# Patient Record
Sex: Male | Born: 1955 | Race: White | Hispanic: No | Smoking: Former smoker
Health system: Southern US, Community
[De-identification: ages and names within clinical notes are randomized; demographics above are authoritative.]

## PROBLEM LIST (undated history)

## (undated) ENCOUNTER — Ambulatory Visit (HOSPITAL_COMMUNITY): Discharge status: Absent without leave | Payer: Medicare PPO

## (undated) DIAGNOSIS — M1990 Unspecified osteoarthritis, unspecified site: Secondary | ICD-10-CM

## (undated) DIAGNOSIS — Z803 Family history of malignant neoplasm of breast: Secondary | ICD-10-CM

## (undated) DIAGNOSIS — I10 Essential (primary) hypertension: Secondary | ICD-10-CM

## (undated) DIAGNOSIS — E785 Hyperlipidemia, unspecified: Secondary | ICD-10-CM

## (undated) DIAGNOSIS — F329 Major depressive disorder, single episode, unspecified: Secondary | ICD-10-CM

## (undated) DIAGNOSIS — Z8049 Family history of malignant neoplasm of other genital organs: Secondary | ICD-10-CM

## (undated) DIAGNOSIS — Z800 Family history of malignant neoplasm of digestive organs: Secondary | ICD-10-CM

## (undated) DIAGNOSIS — G4730 Sleep apnea, unspecified: Secondary | ICD-10-CM

## (undated) DIAGNOSIS — Z923 Personal history of irradiation: Secondary | ICD-10-CM

## (undated) DIAGNOSIS — F32A Depression, unspecified: Secondary | ICD-10-CM

## (undated) DIAGNOSIS — Z86010 Personal history of colonic polyps: Secondary | ICD-10-CM

## (undated) DIAGNOSIS — I482 Chronic atrial fibrillation, unspecified: Secondary | ICD-10-CM

## (undated) DIAGNOSIS — F419 Anxiety disorder, unspecified: Secondary | ICD-10-CM

## (undated) DIAGNOSIS — Z8481 Family history of carrier of genetic disease: Secondary | ICD-10-CM

## (undated) HISTORY — DX: Anxiety disorder, unspecified: F41.9

## (undated) HISTORY — DX: Family history of carrier of genetic disease: Z84.81

## (undated) HISTORY — DX: Family history of malignant neoplasm of digestive organs: Z80.0

## (undated) HISTORY — DX: Family history of malignant neoplasm of breast: Z80.3

## (undated) HISTORY — DX: Family history of malignant neoplasm of other genital organs: Z80.49

## (undated) HISTORY — DX: Sleep apnea, unspecified: G47.30

## (undated) HISTORY — DX: Depression, unspecified: F32.A

## (undated) HISTORY — DX: Hyperlipidemia, unspecified: E78.5

## (undated) HISTORY — DX: Chronic atrial fibrillation, unspecified: I48.20

## (undated) HISTORY — DX: Personal history of colonic polyps: Z86.010

## (undated) HISTORY — DX: Essential (primary) hypertension: I10

## (undated) HISTORY — PX: HIP FRACTURE SURGERY: SHX118

## (undated) HISTORY — DX: Major depressive disorder, single episode, unspecified: F32.9

## (undated) HISTORY — DX: Unspecified osteoarthritis, unspecified site: M19.90

---

## 2000-06-24 ENCOUNTER — Inpatient Hospital Stay (HOSPITAL_COMMUNITY): Admission: EM | Admit: 2000-06-24 | Discharge: 2000-06-25 | Payer: Self-pay | Admitting: *Deleted

## 2000-06-24 ENCOUNTER — Encounter: Payer: Self-pay | Admitting: *Deleted

## 2000-07-08 ENCOUNTER — Encounter: Admission: RE | Admit: 2000-07-08 | Discharge: 2000-07-08 | Payer: Self-pay | Admitting: Family Medicine

## 2000-07-08 HISTORY — PX: OTHER SURGICAL HISTORY: SHX169

## 2000-08-05 ENCOUNTER — Encounter: Admission: RE | Admit: 2000-08-05 | Discharge: 2000-08-05 | Payer: Self-pay | Admitting: Family Medicine

## 2001-03-10 ENCOUNTER — Encounter: Admission: RE | Admit: 2001-03-10 | Discharge: 2001-03-10 | Payer: Self-pay | Admitting: Family Medicine

## 2002-03-23 ENCOUNTER — Encounter: Admission: RE | Admit: 2002-03-23 | Discharge: 2002-03-23 | Payer: Self-pay | Admitting: Family Medicine

## 2002-11-09 HISTORY — PX: ATRIAL ABLATION SURGERY: SHX560

## 2004-09-25 ENCOUNTER — Ambulatory Visit: Payer: Self-pay | Admitting: Family Medicine

## 2005-02-12 ENCOUNTER — Ambulatory Visit (HOSPITAL_COMMUNITY): Admission: RE | Admit: 2005-02-12 | Discharge: 2005-02-12 | Payer: Self-pay | Admitting: Family Medicine

## 2005-02-12 ENCOUNTER — Ambulatory Visit: Payer: Self-pay | Admitting: Family Medicine

## 2005-02-27 ENCOUNTER — Ambulatory Visit (HOSPITAL_COMMUNITY): Admission: RE | Admit: 2005-02-27 | Discharge: 2005-02-27 | Payer: Self-pay | Admitting: *Deleted

## 2005-03-05 ENCOUNTER — Ambulatory Visit (HOSPITAL_COMMUNITY): Admission: RE | Admit: 2005-03-05 | Discharge: 2005-03-06 | Payer: Self-pay | Admitting: *Deleted

## 2006-01-09 ENCOUNTER — Emergency Department (HOSPITAL_COMMUNITY): Admission: EM | Admit: 2006-01-09 | Discharge: 2006-01-09 | Payer: Self-pay | Admitting: Family Medicine

## 2006-06-17 ENCOUNTER — Ambulatory Visit: Payer: Self-pay | Admitting: Family Medicine

## 2006-10-15 ENCOUNTER — Ambulatory Visit: Payer: Self-pay | Admitting: Gastroenterology

## 2006-10-28 ENCOUNTER — Ambulatory Visit: Payer: Self-pay | Admitting: Family Medicine

## 2006-11-12 ENCOUNTER — Ambulatory Visit: Payer: Self-pay | Admitting: Gastroenterology

## 2006-11-12 ENCOUNTER — Encounter (INDEPENDENT_AMBULATORY_CARE_PROVIDER_SITE_OTHER): Payer: Self-pay | Admitting: Specialist

## 2006-11-12 DIAGNOSIS — Z860100 Personal history of colon polyps, unspecified: Secondary | ICD-10-CM

## 2006-11-12 HISTORY — DX: Personal history of colon polyps, unspecified: Z86.0100

## 2006-11-12 HISTORY — DX: Personal history of colonic polyps: Z86.010

## 2006-11-24 ENCOUNTER — Ambulatory Visit (HOSPITAL_COMMUNITY): Admission: RE | Admit: 2006-11-24 | Discharge: 2006-11-24 | Payer: Self-pay | Admitting: Cardiology

## 2006-11-24 ENCOUNTER — Encounter (INDEPENDENT_AMBULATORY_CARE_PROVIDER_SITE_OTHER): Payer: Self-pay | Admitting: Cardiology

## 2006-12-06 ENCOUNTER — Emergency Department (HOSPITAL_COMMUNITY): Admission: EM | Admit: 2006-12-06 | Discharge: 2006-12-06 | Payer: Self-pay

## 2007-01-06 DIAGNOSIS — R000 Tachycardia, unspecified: Secondary | ICD-10-CM | POA: Insufficient documentation

## 2007-01-06 DIAGNOSIS — I4891 Unspecified atrial fibrillation: Secondary | ICD-10-CM | POA: Insufficient documentation

## 2007-02-17 ENCOUNTER — Ambulatory Visit: Payer: Self-pay | Admitting: Family Medicine

## 2007-02-17 DIAGNOSIS — M25569 Pain in unspecified knee: Secondary | ICD-10-CM | POA: Insufficient documentation

## 2007-09-15 ENCOUNTER — Ambulatory Visit: Payer: Self-pay | Admitting: Family Medicine

## 2007-11-09 ENCOUNTER — Emergency Department (HOSPITAL_COMMUNITY): Admission: EM | Admit: 2007-11-09 | Discharge: 2007-11-09 | Payer: Self-pay | Admitting: Family Medicine

## 2007-11-15 ENCOUNTER — Ambulatory Visit: Payer: Self-pay | Admitting: Family Medicine

## 2007-11-15 ENCOUNTER — Encounter: Payer: Self-pay | Admitting: Family Medicine

## 2007-11-15 LAB — CONVERTED CEMR LAB: PSA: 0.5 ng/mL (ref 0.10–4.00)

## 2007-11-17 ENCOUNTER — Encounter: Payer: Self-pay | Admitting: Family Medicine

## 2008-03-09 ENCOUNTER — Telehealth (INDEPENDENT_AMBULATORY_CARE_PROVIDER_SITE_OTHER): Payer: Self-pay | Admitting: Family Medicine

## 2008-03-14 ENCOUNTER — Encounter: Payer: Self-pay | Admitting: Family Medicine

## 2008-03-27 ENCOUNTER — Encounter: Payer: Self-pay | Admitting: Family Medicine

## 2008-03-31 ENCOUNTER — Emergency Department (HOSPITAL_COMMUNITY): Admission: EM | Admit: 2008-03-31 | Discharge: 2008-03-31 | Payer: Self-pay | Admitting: Family Medicine

## 2008-09-19 ENCOUNTER — Encounter: Payer: Self-pay | Admitting: Family Medicine

## 2008-09-20 ENCOUNTER — Ambulatory Visit: Payer: Self-pay | Admitting: Family Medicine

## 2008-09-27 ENCOUNTER — Encounter: Admission: RE | Admit: 2008-09-27 | Discharge: 2008-09-27 | Payer: Self-pay | Admitting: Family Medicine

## 2009-01-03 ENCOUNTER — Ambulatory Visit: Payer: Self-pay | Admitting: Family Medicine

## 2009-01-15 ENCOUNTER — Encounter: Admission: RE | Admit: 2009-01-15 | Discharge: 2009-02-06 | Payer: Self-pay | Admitting: Family Medicine

## 2009-01-15 ENCOUNTER — Encounter: Payer: Self-pay | Admitting: Family Medicine

## 2009-02-06 ENCOUNTER — Encounter: Payer: Self-pay | Admitting: Family Medicine

## 2009-02-07 ENCOUNTER — Telehealth: Payer: Self-pay | Admitting: *Deleted

## 2009-02-21 ENCOUNTER — Ambulatory Visit: Payer: Self-pay | Admitting: Family Medicine

## 2009-02-21 DIAGNOSIS — B351 Tinea unguium: Secondary | ICD-10-CM | POA: Insufficient documentation

## 2009-02-22 LAB — CONVERTED CEMR LAB
ALT: 25 units/L (ref 0–53)
AST: 27 units/L (ref 0–37)
Albumin: 4.3 g/dL (ref 3.5–5.2)
Alkaline Phosphatase: 48 units/L (ref 39–117)
Bilirubin, Direct: 0.2 mg/dL (ref 0.0–0.3)
Cholesterol: 165 mg/dL (ref 0–200)
HDL: 40 mg/dL (ref >39)
Indirect Bilirubin: 1.1 mg/dL — ABNORMAL HIGH (ref 0.0–0.9)
LDL Cholesterol: 105 mg/dL — ABNORMAL HIGH (ref 0–99)
Total Bilirubin: 1.3 mg/dL — ABNORMAL HIGH (ref 0.3–1.2)
Total CHOL/HDL Ratio: 4.1
Total Protein: 7.1 g/dL (ref 6.0–8.3)
Triglycerides: 100 mg/dL (ref <150)
VLDL: 20 mg/dL (ref 0–40)

## 2009-04-15 ENCOUNTER — Encounter: Payer: Self-pay | Admitting: Family Medicine

## 2009-04-15 ENCOUNTER — Ambulatory Visit: Payer: Self-pay | Admitting: Family Medicine

## 2009-04-15 LAB — CONVERTED CEMR LAB
ALT: 24 units/L (ref 0–53)
AST: 29 units/L (ref 0–37)
Albumin: 4.8 g/dL (ref 3.5–5.2)
Alkaline Phosphatase: 44 units/L (ref 39–117)
Bilirubin, Direct: 0.2 mg/dL (ref 0.0–0.3)
Indirect Bilirubin: 1.2 mg/dL — ABNORMAL HIGH (ref 0.0–0.9)
Total Bilirubin: 1.4 mg/dL — ABNORMAL HIGH (ref 0.3–1.2)
Total Protein: 7.4 g/dL (ref 6.0–8.3)

## 2009-04-16 ENCOUNTER — Encounter: Payer: Self-pay | Admitting: Family Medicine

## 2009-05-23 ENCOUNTER — Ambulatory Visit: Payer: Self-pay | Admitting: Family Medicine

## 2009-05-23 DIAGNOSIS — R51 Headache: Secondary | ICD-10-CM | POA: Insufficient documentation

## 2009-05-23 DIAGNOSIS — R519 Headache, unspecified: Secondary | ICD-10-CM | POA: Insufficient documentation

## 2009-12-12 ENCOUNTER — Ambulatory Visit: Payer: Self-pay | Admitting: Family Medicine

## 2009-12-12 LAB — CONVERTED CEMR LAB: PSA: 0.53 ng/mL (ref 0.10–4.00)

## 2010-03-25 ENCOUNTER — Emergency Department (HOSPITAL_COMMUNITY): Admission: EM | Admit: 2010-03-25 | Discharge: 2010-03-25 | Payer: Self-pay | Admitting: Family Medicine

## 2010-06-11 ENCOUNTER — Emergency Department (HOSPITAL_COMMUNITY): Admission: EM | Admit: 2010-06-11 | Discharge: 2010-06-11 | Payer: Self-pay | Admitting: Family Medicine

## 2010-06-15 ENCOUNTER — Emergency Department (HOSPITAL_COMMUNITY): Admission: EM | Admit: 2010-06-15 | Discharge: 2010-06-15 | Payer: Self-pay | Admitting: Family Medicine

## 2010-06-25 ENCOUNTER — Ambulatory Visit: Payer: Self-pay | Admitting: Family Medicine

## 2010-06-25 DIAGNOSIS — R05 Cough: Secondary | ICD-10-CM

## 2010-06-25 DIAGNOSIS — R059 Cough, unspecified: Secondary | ICD-10-CM | POA: Insufficient documentation

## 2010-06-25 DIAGNOSIS — J209 Acute bronchitis, unspecified: Secondary | ICD-10-CM | POA: Insufficient documentation

## 2010-07-04 ENCOUNTER — Encounter: Payer: Self-pay | Admitting: Family Medicine

## 2010-07-08 ENCOUNTER — Encounter: Payer: Self-pay | Admitting: Family Medicine

## 2010-07-22 HISTORY — PX: OTHER SURGICAL HISTORY: SHX169

## 2010-07-29 ENCOUNTER — Encounter: Payer: Self-pay | Admitting: Family Medicine

## 2010-10-21 ENCOUNTER — Encounter: Payer: Self-pay | Admitting: Family Medicine

## 2010-12-11 ENCOUNTER — Encounter: Payer: Self-pay | Admitting: *Deleted

## 2010-12-11 NOTE — Assessment & Plan Note (Signed)
Summary: CPP/EO   Vital Signs:  Patient profile:   55 year old male Height:      69 inches Weight:      165 pounds BMI:     24.45 BSA:     1.91 Temp:     97.9 degrees F Pulse rate:   51 / minute BP sitting:   122 / 82  Vitals Entered By: Jone Baseman CMA (December 12, 2009 9:05 AM) CC: CPE Is Patient Diabetic? No Pain Assessment Patient in pain? no        CC:  CPE.  History of Present Illness: Mild aching in hand joints - no soft tissue swelling or redness or other joints Hemorrhiods - itching and small amount of discharge occaisional but no bleeding  ROS - as above PMH - Medications reviewed and updated in medication list.  Smoking Status noted in VS form    Habits & Providers  Alcohol-Tobacco-Diet     Tobacco Status: never  -  Date:  11/09/2005    Colonoscopy had polyp at [facility] [place] [facility]  Current Medications (verified): 1)  Ibuprofen 800 Mg Tabs (Ibuprofen) .... Take 1 Tablet By Mouth Three Times A Day As Needed  Allergies: No Known Drug Allergies  Family History: Reviewed history from 02/17/2007 and no changes required. coronary art disase father,  Diabetes 1st degree,  No colon CA  Social History: Reviewed history from 01/06/2007 and no changes required. Single, works at [employer],  Exercises regularly  Review of Systems  The patient denies anorexia, fever, weight loss, weight gain, vision loss, decreased hearing, hoarseness, chest pain, syncope, dyspnea on exertion, peripheral edema, prolonged cough, headaches, hemoptysis, abdominal pain, melena, hematochezia, severe indigestion/heartburn, hematuria, incontinence, genital sores, muscle weakness, suspicious skin lesions, transient blindness, difficulty walking, depression, unusual weight change, abnormal bleeding, enlarged lymph nodes, angioedema, and testicular masses.    Physical Exam  General:  Well-developed,well-nourished,in no acute distress; alert,appropriate and cooperative throughout  examination Head:  Normocephalic and atraumatic without obvious abnormalities. No apparent alopecia or balding. Eyes:  No corneal or conjunctival inflammation noted. EOMI. Perrla. Frmal. Ears:  External ear exam shows no significant lesions or deformities.  Otoscopic examination reveals clear canals, tympanic membranes are intact bilaterally without bulging, retraction, inflammation or discharge. Hearing is grossly normal bilaterally. Mouth:  Oral mucosa and oropharynx without lesions or exudates.  Teeth in good repair. Neck:  No deformities, masses, or tenderness noted. Lungs:  Normal respiratory effort, chest expands symmetrically. Lungs are clear to auscultation, no crackles or wheezes. Heart:  Normal rate and regular rhythm. S1 and S2 normal without gallop, murmur, click, rub or other extra sounds. Abdomen:  Bowel sounds positive,abdomen soft and non-tender without masses, organomegaly or hernias noted. Rectal:  Normal sphincter tone. No rectal masses or tenderness. external hemorrhoidal tags  Genitalia:  Testes bilaterally descended without nodularity, tenderness or masses. No scrotal masses or lesions. No penis lesions or urethral discharge. Prostate:  Prostate gland firm and smooth, no enlargement, nodularity, tenderness, mass, asymmetry or induration. Msk:  No deformity or scoliosis noted of thoracic or lumbar spine.   Extremities:  No clubbing, cyanosis, edema, or deformity noted with normal full range of motion of all joints.   Skin:  Intact without suspicious lesions or rashes Cervical Nodes:  No lymphadenopathy noted Inguinal Nodes:  No significant adenopathy Psych:  Cognition and judgment appear intact. Alert and cooperative with normal attention span and concentration. No apparent delusions, illusions, hallucinations   Impression & Recommendations:  Problem # 1:  Preventive Health Care (ICD-V70.0)  normal exam.  Mild  degenerative joint disease of hands and uncomplicated  hemorrhoids.  up to date on screening except PSA   Orders: [facility] - Est  40-64 yrs (57846)  Problem # 2:  ONYCHOMYCOSIS (ICD-110.1) finished course - did not improve much  The following medications were removed from the medication list:    Lamisil 250 Mg Tabs (Terbinafine hcl) .Marland Kitchen... 1 by mouth once daily for onchyomycosis  Problem # 3:  TACHYCARDIA (ICD-785.0) no symptoms   Complete Medication List: 1)  Ibuprofen 800 Mg Tabs (Ibuprofen) .... Take 1 tablet by mouth three times a day as needed  Other Orders: PSA-[facility] ([id number])  Patient Instructions: 1)  Please schedule a follow-up appointment in 1 year.  2)  Your weight and exercise regimen are great - keep them going 3)  See an optometrist to be checked for glaucoma 4)  We will check your PSA and call you if any problems 5)  Use ibuprofen or tylenol for hand pain   Prevention & Chronic Care Immunizations   Influenza vaccine: Not documented    Tetanus booster: 05/09/2006: Done.   Tetanus booster due: 05/09/2016    Pneumococcal vaccine: Not documented  Colorectal Screening   Hemoccult: .  (09/20/2008)   Hemoccult due: Not Indicated    Colonoscopy: had polyp at [facility]  (11/09/2005)   Colonoscopy due: 11/09/2010  Other Screening   PSA: 0.50  (11/15/2007)   PSA ordered.   PSA due due: 11/14/2008   Smoking status: never  (12/12/2009)  Lipids   Total Cholesterol: 165  (02/21/2009)   LDL: 105  (02/21/2009)   LDL Direct: Not documented   HDL: 40  (02/21/2009)   Triglycerides: 100  (02/21/2009)

## 2010-12-11 NOTE — Consult Note (Signed)
Summary: [facility]   Imported By: De Nurse 10/30/2010 17:01:22  _____________________________________________________________________  External Attachment:    Type:   Image     Comment:   External Document

## 2010-12-11 NOTE — Consult Note (Signed)
Summary: [facility]   Imported By: Bradly Bienenstock 07/15/2010 13:55:17  _____________________________________________________________________  External Attachment:    Type:   Image     Comment:   External Document

## 2010-12-11 NOTE — Consult Note (Signed)
Summary: [facility]   Imported By: De Nurse 08/27/2010 14:52:37  _____________________________________________________________________  External Attachment:    Type:   Image     Comment:   External Document

## 2010-12-11 NOTE — Consult Note (Signed)
Summary: [facility]  [facility]   Imported By: Clydell Hakim 07/29/2010 12:03:52  _____________________________________________________________________  External Attachment:    Type:   Image     Comment:   External Document

## 2010-12-11 NOTE — Assessment & Plan Note (Signed)
Summary: persistant cough,df   Vital Signs:  Patient profile:   55 year old male Height:      69 inches (175.26 cm) Weight:      159.2 pounds (72.36 kg) BMI:     23.59 Temp:     97.7 degrees F (36.50 degrees C) oral Pulse rate:   85 / minute BP sitting:   121 / 83  (left arm)  Vitals Entered By: Starleen Arms CMA (June 25, 2010 9:36 AM) CC: persistant cough Is Patient Diabetic? No Pain Assessment Patient in pain? no        Primary Care Provider:  Pearlean Brownie MD  CC:  persistant cough.  History of Present Illness: 55 yo M:  1. Cough: x 15 days, started to get better last week but worsened again, endorses cough - productive of green-brown sputum, subjective fever/chills, sore throat early but now resolved, weakness, nasal congestion/runny nose, intermittent gneralized HA. denies ear pain, SOB, CP, N/V/D, LE edema, rash, sinus pain. + sick contacts. no Hx of smoking, asthma, allergies. has been seen at [facility] twice during this illness - treated with Mucinex, Tussinex, Ibuprofen, and a steroid burst. Tussinex has helped with the cough - only taking at night and has 1/2 bottle left.   Habits & Providers  Alcohol-Tobacco-Diet     Tobacco Status: never  Current Medications (verified): 1)  Ibuprofen 800 Mg Tabs (Ibuprofen) .... Take 1 Tablet By Mouth Three Times A Day As Needed 2)  Amoxicillin 500 Mg Tabs (Amoxicillin) .... One By Mouth Three Times A Day X 10 Days  Allergies (verified): No Known Drug Allergies PMH-FH-SH reviewed for relevance  Review of Systems      See HPI  Physical Exam  General:  Well-developed, well-nourished, in no acute distress; alert, appropriate and cooperative throughout examination. Vitals reviewed. Lethargic. Head:  Normocephalic and atraumatic without obvious abnormalities.  Eyes:  No corneal or conjunctival inflammation noted. EOMI. Perrla.  Ears:  R ear normal and L ear normal.   Nose:  Nasal discharge, mucosal pallor.   Mouth:   Oral mucosa and oropharynx without lesions or exudates.   Neck:  No deformities, masses, or tenderness noted. Lungs:  Normal respiratory effort, chest expands symmetrically. Rhonci throughout, cleared with cough. Heart:  Normal rate and regular rhythm. S1 and S2 normal without gallop, murmur, click, rub or other extra sounds. Pulses:  R and L dorsalis pedis and posterior tibial pulses are full and equal bilaterally. Extremities:  No edema. Skin:  Intact without suspicious lesions or rashes. Psych:  Oriented X3, memory intact for recent and remote, normally interactive, good eye contact, not anxious appearing, and not depressed appearing.     Impression & Recommendations:  Problem # 1:  COUGH (ICD-786.2) Assessment New  Orders: [facility]- Est Level  3 (47425)  Problem # 2:  ACUTE BRONCHITIS (ICD-466.0)  Discussed likelihood that this is viral. However, due to story of improvement and then deterioration, as well as length of cough, will Rx Abx. Continue Tussinex for cough. Warned him that cough may last for 3-4 weeks. Red Flags given.  His updated medication list for this problem includes:    Azithromycin 250 Mg Tabs (Azithromycin) .Marland Kitchen... 2 by  mouth today and then 1 daily for 4 days  Orders: [facility]- Est Level  3 (95638)  Patient Instructions: 1)  It was nice to meet you today. 2)  I am prescribing an antibiotic for your respiratory infection. 3)  Follow up if you are not improving in  2-3 weeks. Prescriptions: AZITHROMYCIN 250 MG  TABS (AZITHROMYCIN) 2 by  mouth today and then 1 daily for 4 days  #6 x 0   Entered and Authorized by:   Helane Rima DO   Signed by:   Helane Rima DO on 06/25/2010   Method used:   Electronically to        [facility]* (retail)       [address]       [address]/mailing       [place], [place]  [address]       Ph: [phone]       Fax: [phone]   RxID:   [id number] AMOXICILLIN 500 MG TABS (AMOXICILLIN) one by  mouth three times a day x 10 days  #30 x 0   Entered and Authorized by:   Helane Rima DO   Signed by:   Helane Rima DO on 06/25/2010   Method used:   Electronically to        [facility]* (retail)       [address]       [address]mail[address]       [place], [place]  [address]       Ph: [phone]       Fax: [phone]   RxID:   [id number]

## 2010-12-17 ENCOUNTER — Ambulatory Visit (INDEPENDENT_AMBULATORY_CARE_PROVIDER_SITE_OTHER): Payer: Commercial Managed Care - PPO | Admitting: Family Medicine

## 2010-12-17 ENCOUNTER — Encounter: Payer: Self-pay | Admitting: Family Medicine

## 2010-12-17 VITALS — BP 140/88 | HR 63 | Temp 97.6°F | Ht 69.0 in | Wt 163.7 lb

## 2010-12-17 DIAGNOSIS — G4700 Insomnia, unspecified: Secondary | ICD-10-CM

## 2010-12-17 DIAGNOSIS — I4891 Unspecified atrial fibrillation: Secondary | ICD-10-CM

## 2010-12-17 NOTE — Progress Notes (Signed)
  Subjective:    Patient ID: Wayne Molina, male    DOB: 10-17-1956, 55 y.o.   MRN: [id number]  HPI  Generally feel well Here for his checkup  Insomnia Wakes up frequently.  Bed partner says he snores and often stops breathing.  Does have day time sleepiness.  No chest pain or shortness of breath during the day.   Has regular sleep habits   Review of Systems  Constitutional: Positive for fatigue. Negative for activity change and appetite change.  HENT: Negative for facial swelling and neck pain.   Eyes: Negative for discharge.  Respiratory: Negative for shortness of breath and wheezing.   Cardiovascular: Negative for chest pain and leg swelling.  Genitourinary: Negative for hematuria and difficulty urinating.  Musculoskeletal: Negative for back pain and gait problem.  Neurological: Negative for syncope and weakness.  Hematological: Negative for adenopathy.       Objective:   Physical Exam  Constitutional: He is oriented to person, place, and time. He appears well-developed.  HENT:  Head: Normocephalic and atraumatic.  Eyes: EOM are normal. Pupils are equal, round, and reactive to light.  Neck: Normal range of motion. Neck supple. No thyromegaly present.  Cardiovascular: Normal rate.   Murmur heard.      Irregular   Pulmonary/Chest: No respiratory distress. He has no wheezes. He has no rales.  Abdominal: He exhibits no distension and no mass. There is no tenderness.  Musculoskeletal: He exhibits no edema and no tenderness.  Lymphadenopathy:    He has no cervical adenopathy.  Neurological: He is alert and oriented to person, place, and time. He exhibits normal muscle tone. Coordination normal.  Skin: Skin is warm and dry. No rash noted. No erythema.  Psychiatric: He has a normal mood and affect. His behavior is normal.          Assessment & Plan:  Normal Exam.

## 2010-12-17 NOTE — Patient Instructions (Signed)
We will schedule a sleep study Keep up the great exercise Drop off a copy of your employee labs

## 2010-12-17 NOTE — Assessment & Plan Note (Signed)
Concerned for sleep apnea.  No other worrisome findings on exam.  Refer for sleep study

## 2011-01-05 ENCOUNTER — Telehealth: Payer: Self-pay | Admitting: Family Medicine

## 2011-01-05 NOTE — Telephone Encounter (Signed)
Pt calling about his referral for sleep study, we did not send pts tricare info to the sleep center, pt has umr & tricare.

## 2011-01-05 NOTE — Telephone Encounter (Signed)
Left message on voicemail that I spoke with [facility] and that I would fax over tricare card and they would be calling him back to set up appointment.

## 2011-01-29 ENCOUNTER — Ambulatory Visit (HOSPITAL_BASED_OUTPATIENT_CLINIC_OR_DEPARTMENT_OTHER): Payer: 59 | Attending: Family Medicine

## 2011-01-29 DIAGNOSIS — G4733 Obstructive sleep apnea (adult) (pediatric): Secondary | ICD-10-CM | POA: Insufficient documentation

## 2011-02-07 DIAGNOSIS — R0989 Other specified symptoms and signs involving the circulatory and respiratory systems: Secondary | ICD-10-CM

## 2011-02-07 DIAGNOSIS — G4733 Obstructive sleep apnea (adult) (pediatric): Secondary | ICD-10-CM

## 2011-02-07 DIAGNOSIS — R0609 Other forms of dyspnea: Secondary | ICD-10-CM

## 2011-02-07 DIAGNOSIS — G4737 Central sleep apnea in conditions classified elsewhere: Secondary | ICD-10-CM

## 2011-02-11 ENCOUNTER — Encounter: Payer: Self-pay | Admitting: Family Medicine

## 2011-02-11 ENCOUNTER — Ambulatory Visit (INDEPENDENT_AMBULATORY_CARE_PROVIDER_SITE_OTHER): Payer: Commercial Managed Care - PPO | Admitting: Family Medicine

## 2011-02-11 DIAGNOSIS — G4700 Insomnia, unspecified: Secondary | ICD-10-CM

## 2011-02-11 DIAGNOSIS — M79606 Pain in leg, unspecified: Secondary | ICD-10-CM

## 2011-02-11 DIAGNOSIS — M79609 Pain in unspecified limb: Secondary | ICD-10-CM

## 2011-02-11 NOTE — Assessment & Plan Note (Signed)
Most consistent with nerve irritation or tendon slippage but uncertain prognosis.  I can not reproduce on exam.  Doubt bony process but is at increased risk with prior hip surgery.  If persists or worsens will need xray.

## 2011-02-11 NOTE — Patient Instructions (Signed)
I think this is nerve or tendon slipping.  If worsens or becomes constant the call me and we will get an xray Check your blood pressure every so often.  If regularly > 140/90 then come in

## 2011-02-11 NOTE — Progress Notes (Signed)
  Subjective:    Patient ID: Wayne Molina, male    DOB: 27-Mar-1956, 55 y.o.   MRN: [id number]  HPI  Hip Pain Has an intermittent sharp shocking type pain in his R hip.   Lasts for split second and resolves.  Mainly when walking.  No radiation past mid thigh.  No back pain no incontinence no tender areas.   Has taken a few ibuprofen but do not seem to helps  PMH - had a fractured R hip in the 70s.   Had rods but have been removed   Review of Systems  See HPI     Objective:   Physical Exam   Extremities:  No cyanosis, edema, or deformity noted with good range of motion of all major joints.   R HIp - no redness or focal tenderness.  No pain with all ROM. No pain with SLR.  Able to walk on toes, heels and deep knee bend without pain     Assessment & Plan:

## 2011-02-12 NOTE — Assessment & Plan Note (Signed)
No results from Sleep study yet.  He relates he could not tolerate the mask.  Is sleeping better with flannel sheets

## 2011-02-24 ENCOUNTER — Telehealth: Payer: Self-pay | Admitting: Family Medicine

## 2011-02-25 NOTE — Telephone Encounter (Signed)
Spoke with him he will try CPAP and see if he can get used to it

## 2011-02-25 NOTE — Telephone Encounter (Signed)
Pt returning call, may be reached at above number anytime after 2 pm today.

## 2011-03-04 ENCOUNTER — Other Ambulatory Visit: Payer: Self-pay | Admitting: Family Medicine

## 2011-03-04 DIAGNOSIS — G4730 Sleep apnea, unspecified: Secondary | ICD-10-CM | POA: Insufficient documentation

## 2011-03-12 ENCOUNTER — Encounter: Payer: Self-pay | Admitting: Family Medicine

## 2011-03-27 NOTE — Assessment & Plan Note (Signed)
[facility]                         GASTROENTEROLOGY OFFICE NOTE   Wayne Molina, Wayne Molina                        MRN:          [id number]  DATE:10/15/2006                            DOB:          1956/07/14    Wayne Molina is a 55 year old white male, employee at [employer].  He  is referred through the courtesy of Dr. Deirdre Priest for colonoscopy  screening and evaluation of excessive abdominal gas.   Wayne Molina has been in good health all of his life except for some recurrent  atrial fibrillation requiring ablative therapy by Dr. Lenord Carbo in 2003.  He currently denies GI complaints.  He does have lactose intolerance,  and tries to avoid these substances.  He has a very dry mouth and uses  frequent candy and gum, which probably contains sorbitol and/or  fructose, which kind of prolong abdominal gas and bloating.  He  generally has regular bowel movements, denies melena or hematochezia,  and apparently had a negative Hemoccult card recently with his complete  physical exam.  He denies reflux symptoms, dysphagia, or any  hepatobiliary complaints.  He moves his bowels well without melena or  hematochezia.  He did have an empiric infection in 1978 when he was in  the [employer], but this resolved spontaneously.  He did travel  extensively to third world countries, but never had dysentery.  His  appetite is good, and his weight is stable.  He has never had barium or  endoscopic procedures.   PAST MEDICAL HISTORY:  Otherwise is remarkable for some degenerative  arthritis.  He denies any of history of medical problems.   MEDICATIONS:  Aspirin 325 mg a day.   ALLERGIES:  NONE.   FAMILY HISTORY:  Remarkable for a great grandmother who had colon cancer  at an elderly age.  His father has atherogenesis.   SOCIAL HISTORY:  He is divorced and lives with his daughter.  He has a  high school education.  He does not smoke or abuse ethanol.   REVIEW OF SYSTEMS:   Noncontributory.   EXAMINATION:  He is a healthy, fit-appearing white male, in no distress,  appearing his stated age.  He is 5 feet 9 inches tall and weighs 163 pounds.  Blood pressure is  110/72.  Pulse was 60 and regular.  I could not appreciate stigmata of chronic liver disease or thyromegaly.  CHEST:  His chest was clear to percussion and auscultation.  There were no murmurs, gallops or rubs noted.  He appeared bradycardic,  but in a normal rhythm.  There was no hepatosplenomegaly, abdominal masses or tenderness.  Bowel  sounds were normal.  Peripheral extremities were unremarkable.  Mental status was clear.   ASSESSMENT:  1. Need for screening colonoscopy because of his age.  2. Excessive gas, probably from carbohydrate malabsorption.  3. History of atrial fibrillation with previous ablation.  He      currently is somewhat bradycardic, probably due to his extreme      conditioning.   RECOMMENDATIONS:  1. Outpatient colonoscopy on ASA at his convenience.  2. Low gas  diet with avoidance of sorbitol and fructose.  3. Information concerning lactose intolerance.  4. Continue medical followup with Dr. Deirdre Priest as previously planned.     Vania Rea. Jarold Motto, MD, Caleen Essex, FAGA  Electronically Signed    DRP/MedQ  DD: 10/15/2006  DT: 10/15/2006  Job #: [id number]   cc:   Pearlean Brownie, M.D.

## 2011-03-27 NOTE — Cardiovascular Report (Signed)
NAME:  Wayne Molina, Wayne Molina NO.:  [id number]   MEDICAL RECORD NO.:  [id number]          PATIENT TYPE:  OIB   LOCATION:  3729                         FACILITY:  [facility]   PHYSICIAN:  Mark E. Severiano Gilbert, M.D.    DATE OF BIRTH:  May 11, 1956   DATE OF PROCEDURE:  03/05/2005  DATE OF DISCHARGE:                              CARDIAC CATHETERIZATION   PROCEDURE PERFORMED:  1.  Complete electrophysiology study with arrhythmia injection.  2.  Arrhythmia sub-straight mapping.  3.  Radiofrequency ablation of arrhythmia sub-straight.   OPERATOR:  Mark E. Severiano Gilbert, M.D.   COMPLICATIONS:  None.   ESTIMATED BLOOD LOSS:  Less than 30 mL.   PREPROCEDURE DIAGNOSIS:  Atrial flutter.   POSTPROCEDURE DIAGNOSIS:  Sinus brady.   MEDICATIONS GIVEN:  Versed, Fentanyl, Phenergan.   PROCEDURE IN DETAIL:  The patient was brought to the EP laboratory in a  fasting state.  The patient was prepped and draped.  Two 7 French safe  sheath systems were introduced into the right femoral vein using thin wall  needle technique after 1% local lidocaine for anesthesia.  On the left  femoral vein, a 7 [place] safe sheath and an 8 [place] atrial flutter ablation  sheath were placed using a thin wall needle technique after topical and  subcutaneous anesthesia with lidocaine.  Via the safe sheath system, a  decapolar catheter was positioned within the coronary sinus.  A dual  decapolar catheter was positioned along the lateral wall of the right  atrium, a hexapolar catheter was positioned in the His area, and an EPTA  symmetric radiofrequency ablation catheter was used to map and ablate.  The  patient was in atrial flutter at presentation.  After positioning the  catheters in appropriate positions, it was easy to document that he had  counter clockwise isthmus dependent flutter based on morphology.  Cycle  length of flutter was approximately 200 milliseconds.  Three ablation lines  were made from the tricuspid  annulus, initially through the mouth of the  coronary sinus to the IVC and then two other additional lines through the  tricuspid annulus directly into the IVC.  There was relative radiofrequency  ablation resistant rigid tissue in the area of the eustachian ridge that was  denoted after conversion of sinus rhythm by persistent non-split atrial  electrograms.  After multiple radiofrequency deliveries, there was  development of bidirectional block under pacing.  Prior to the development  of bidirectional block, burst atrial pacing had been able to induce atrial  flutter reproducible.  The rhythm post ablation was sinus rhythm, cycle  length was noted to be approximately 1000 with an AH interval of 110, HV  interval, and the QRS was narrow.  After completion of the procedure, all  sheaths and electrode catheters were removed without difficulty.  The  patient tolerated the procedure well and left the cath lab in stable,  hemodynamic condition.   IMPRESSION:  1.  Counter clockwise flutter.  2.  Status post radiofrequency ablation with development of bidirectional      isthmus block.   RECOMMENDATIONS:  See orders, I would recommend the patient be placed on  aspirin therapy full dose daily.      MEP/MEDQ  D:  03/05/2005  T:  03/05/2005  Job:  [id number]

## 2011-03-27 NOTE — Discharge Summary (Signed)
[place]  Patient:    BLANDON, OFFERDAHL                          MRN: [id number] Adm. Date:  [id number] Disc. Date: [id number] Attending:  Garnette Scheuermann Dictator:   Milana Huntsman, M.D.                           Discharge Summary  DATE OF BIRTH:  1956-08-14  DISCHARGE DIAGNOSIS:  Chest pain, likely viral pericarditis or gastroesophageal reflux disease.  CONSULTS WHILE IN HOSPITAL:  Cardiology.  PROCEDURES WHILE IN HOSPITAL: 1. Cardiac catheterization on June 25, 2000.  This was completely normal. 2. Echocardiogram on June 25, 2000.  This also was completely normal.  PAST MEDICAL HISTORY:  None.  PRESENTING ILLNESS:  For full history and physical please consult the dictation of June 24, 2000, but briefly this is a 55 year old male with no previous history of cardiac problems who presented to the emergency department with 4-5/10 in intensity chest pressure, substernally, with radiation to the manubrium.  The patient denied a history of chest pain on exertion, and denied shortness of breath, palpitations, or diaphoresis with episode.  However, he did state that he felt weak in his arms and legs.  On presentation to the emergency room the patient was found to have normal cardiac enzymes, but an EKG significant for some nonspecific ST depressions in leads II, III, and aVF. Chest x-ray at that time was negative.  Due to the patients questionable EKG changes, the patient was admitted with a diagnosis of atypical chest pain, rule out unstable angina.  HOSPITAL COURSE BY PROBLEM:  CARDIAC WORKUP:  The patient was started on unstable angina regimen of heparin, beta blocker, morphine sulfate, and aspirin.  Cardiac consultation was obtained on the evening of June 24, 2000, and they felt the patients symptoms and questionable EKG was worrisome to warrant cardiac catheterization which was performed on June 25, 2000, which showed no  vessel disease.  The patient also had two sets of negative cardiac enzymes.  The patient then underwent an echocardiogram which also was grossly normal.  Given the patients questionable EKG changes, cardiology felt that this could be an acute pericarditis, likely viral, or potentially GERD symptoms.  Therefore, the patient was given one dose of Decadron 4 mg x 1 in-house, and discharged with a prednisone taper, as well as a proton pump inhibitor for potential GI contribution to his pain.  DISCHARGE CONDITION:  The patient was discharged in good condition.  DISCHARGE MEDICATIONS: 1. The patient was given one prednisone taper pack per pharmacy. 2. Protonix 40 mg p.o. q.d. x 1 month.  PATIENT FOLLOWUP:  The patient was advised to make a followup appointment within the next week with his regular physician. DD:  06/27/00 TD:  06/28/00 Job: [id number]

## 2011-03-27 NOTE — Cardiovascular Report (Signed)
[place]  Patient:    Wayne Molina, Wayne Molina                          MRN: [id number] Adm. Date:  [id number] Attending:  Garnette Scheuermann CC:         [facility]  [facility], [address] [place] [place], [place] [address]                        Cardiac Catheterization  INDICATION:  Mr. Burks is a 55 year old divorced white male, admitted on June 24, 2000 with chest pain.  He has no prior cardiac history, nor does he have risk factors.  He was awakened with substernal chest pain radiating to his neck and back with upper extremity weakness; this occurred after eating. He came to [facility] ER where he was seen to have chest pain and was treated with IV heparin, nitroglycerin and aspirin.  His EKG showed biphasic anterior T waves which ultimately evolved into T wave inversion anterolaterally.  He ruled out for myocardial infarction.  He presents now for diagnostic coronary arteriography.  PROCEDURE DESCRIPTION:  Patient was brought to the second floor [place] [facility] in a post-absorptive state.  He was premedicated with p.o. Valium.  His right groin was shaved and prepped in the usual sterile fashion. Xylocaine 1% was used for local anesthesia.  A 6-French sheath was inserted into the right femoral artery using standard Seldinger technique.  The 6-French right and left Judkins diagnostic catheters along with a 6-French pigtail catheter were used for selective coronary angiography, left ventriculography, supravalvular aortography and abdominal aortography. Omnipaque dye was used for the entirety of the case.  Retrograde aortic, left ventricular and pullback pressures were recorded.  HEMODYNAMICS 1. Aortic systolic pressure 120; diastolic pressure 73. 2. Left ventricular systolic pressure 114; end-diastolic pressure 23. 3. Left anterior coronary angiography:    a. Left main normal.    b. LAD normal.    c. Left circumflex normal.    d. Right coronary artery was codominant and normal. 4. Left ventriculography:  RAO left ventriculogram was performed using 20 cc    of Omnipaque dye, 10 cc/sec.  The overall LVEF was estimated at greater    than 60% without focal wall motion abnormalities. 5. Supravalvular aortography:  Supravalvular aortogram was performed in the    LAO cranial view using 20 cc of Omnipaque dye at 20 cc/sec.  There was no    evidence of aortic dissection.  There was no aortic insufficiency noted.    All ______ were intact. 6. Distal abdominal aortography:  Distal abdominal aortogram was performed    using 20 cc of Omnipaque dye at 20 cc/sec.  The renal arteries were widely    patent.  The infrarenal abdominal aorta and iliac bifurcation appeared free    of significant atherosclerotic changes.  OVERALL IMPRESSION:  Mr. Everton has essentially normal catheterization with normal cors, normal left ventricle and no evidence of aortic dissection.  I suspect his symptoms are either pericarditis plus-or-minus a component of reflux.  We will get a 2-D echo for completeness sake and we will treat him with intravenous Decadron, a Sterapred prednisone taper pack and a proton pump inhibitor.  He will go home in the next 24 to 48 hours.  Dr. Royal Hawthorn B. Fields of the family practice service was notified of these results.  The patient left the lab in stable condition.  His right groin was hemostatically sealed with a Perclose suture closure device.   DD:  06/25/00 TD:  06/25/00 Job: [id number]

## 2011-03-27 NOTE — H&P (Signed)
[place]  Patient:    Wayne Molina, Wayne Molina                          MRN: [id number] Adm. Date:  [id number] Attending:  Garnette Scheuermann Dictator:   Kinnie Scales Reed Breech, M.D.                         History and Physical  CHIEF COMPLAINT: Chest pressure.  HISTORY OF PRESENT ILLNESS: This patient was eating a pork chop at 10:30 yesterday in the morning. Half an hour later he developed 4-5/10 chest pressure. He reports he tried drinking an increased amount of water, but this did not relieve his chest pressure. The patient had substernal chest pain with radiation to his manubrium; none down his arm. No relief from position changes. The patient reports no new medications, activities or exercise. Overall, he is pretty healthy. He also had some weakness in his arms and legs subsequent to this chest pressure. He denies shortness of breath or palpitations. He denies any history like this, for example, GERD or asthma.  PAST MEDICAL HISTORY: No diabetes, hypertension or hypercholesterolemia.  MEDICATIONS: None.  ALLERGIES: None.  PAST SURGICAL HISTORY: He had a right hip pinning secondary to fracture in 1975.  SOCIAL HISTORY: No tobacco, alcohol or illicit substances. 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 He is divorced and has one daughter.  FAMILY HISTORY: Father has had two MIs; the first roughly around the age of 86. A grandmother died of an MI in her late 57s. No diabetes, cancer or stroke in the family.  REVIEW OF SYSTEMS: CONSTITUTIONAL: No fevers, chills, weight gain or weight loss. CARDIOVASCULAR: No palpitations. RESPIRATORY: No shortness of breath or wheezing. GI: No nausea, vomiting, diarrhea or constipation. No heartburn. No NEUROLOGIC: He reports no deficits. MUSCULOSKELETAL: No baseline weakness. HEENT: No diplopia or tinnitus. GU: No dysuria or urgency.  PHYSICAL EXAMINATION:  GENERAL: The patient is a pleasant male in no acute  distress.  VITAL SIGNS: Temperature 97.6, blood pressure 137/83, pulse rate 60s, respiratory rate 20, 97% SAO2.  HEENT: TMs clear bilaterally. PERRLA, EOMI, no nystagmus. Nares patent, nonerythematous, no discharge. Oropharynx nonerythematous. Recent extraction of upper molars. Poor dentition. No lesions noted. Moist mucous membranes.  NECK: No lymphadenopathy.  CHEST: Clear to auscultation bilaterally.  HEART: Regular rate and rhythm without murmurs.  ABDOMEN: Nondistended, nontender. Positive bowel sounds. No heptosplenomegaly.  EXTREMITIES: No cyanosis, clubbing or edema. Muscle strength 5/5 in the upper and lower extremities.  NEUROLOGICAL: Cranial nerves 2-12 grossly intact.  LABORATORY: EKG showed ST depression in II, III and AVF. Chest x-ray no acute changes. WBC 7.4, hemoglobin 14.9, platelets 279,000.  Troponin I 0.09.  ASSESSMENT & PLAN: 1.  A 55 year old male with atypical chest pain, rule out cardiac.     Will treat this as if unstable and start Heparin, nitroglycerin     GTT. Will give morphine sulfate, beta blocker,aspirin and attempt GI     cocktail. Other considerations would be myocarditis, pericarditis,     esophageal spasm, costochondritis, or esophageal stricture. 2.  Gastrointestinal:  We will attempt GI cocktail and give Pepcid q. day. 3.  Risk factors: Will check cholesterol in the morning. He is at increased     risk secondary a family history, otherwise, a fairly healthy     gentleman. We will also consult cardiology. DD:  06/25/00 TD:  06/25/00 Job: [id number]

## 2011-04-29 ENCOUNTER — Telehealth: Payer: Self-pay | Admitting: Family Medicine

## 2011-04-29 DIAGNOSIS — M722 Plantar fascial fibromatosis: Secondary | ICD-10-CM | POA: Insufficient documentation

## 2011-04-29 NOTE — Telephone Encounter (Signed)
Need another referral asap for plantar fasciitis to [facility].  They are located at [address] ,[phone] Fax# [phone].  Please call when ready.

## 2011-04-29 NOTE — Telephone Encounter (Signed)
Tried calling to scheduled but was unsure if patient needed PT or to see MD, left message for pt to call and schedule this appt himself.

## 2011-06-25 ENCOUNTER — Encounter: Payer: Self-pay | Admitting: Internal Medicine

## 2012-01-06 ENCOUNTER — Encounter: Payer: Self-pay | Admitting: Gastroenterology

## 2012-01-13 ENCOUNTER — Encounter: Payer: Self-pay | Admitting: Gastroenterology

## 2012-01-25 ENCOUNTER — Ambulatory Visit (AMBULATORY_SURGERY_CENTER): Payer: 59 | Admitting: *Deleted

## 2012-01-25 VITALS — Ht 69.0 in | Wt 163.7 lb

## 2012-01-25 DIAGNOSIS — Z1211 Encounter for screening for malignant neoplasm of colon: Secondary | ICD-10-CM

## 2012-02-08 ENCOUNTER — Encounter: Payer: 59 | Admitting: Gastroenterology

## 2012-02-10 ENCOUNTER — Encounter: Payer: Self-pay | Admitting: *Deleted

## 2012-02-11 ENCOUNTER — Encounter: Payer: Self-pay | Admitting: Gastroenterology

## 2012-02-11 ENCOUNTER — Ambulatory Visit (INDEPENDENT_AMBULATORY_CARE_PROVIDER_SITE_OTHER): Payer: 59 | Admitting: Gastroenterology

## 2012-02-11 VITALS — BP 136/90 | HR 68 | Ht 69.0 in | Wt 164.4 lb

## 2012-02-11 DIAGNOSIS — R141 Gas pain: Secondary | ICD-10-CM

## 2012-02-11 DIAGNOSIS — Z860100 Personal history of colon polyps, unspecified: Secondary | ICD-10-CM

## 2012-02-11 DIAGNOSIS — Z800 Family history of malignant neoplasm of digestive organs: Secondary | ICD-10-CM

## 2012-02-11 DIAGNOSIS — K219 Gastro-esophageal reflux disease without esophagitis: Secondary | ICD-10-CM

## 2012-02-11 MED ORDER — OMEPRAZOLE 20 MG PO CPDR: 20.0000 mg | DELAYED_RELEASE_CAPSULE | Freq: Every day | ORAL | Status: DC

## 2012-02-11 MED ORDER — MOVIPREP 100 G PO SOLR: 1.0000 | Freq: Once | ORAL | Status: DC

## 2012-02-11 NOTE — Patient Instructions (Signed)
Your procedure has been scheduled for 03/23/2012, please follow the seperate instructions.  Your prescription(s) have been sent to you pharmacy.  Follow the artificial sweeteners sheet given today.  Genella Rife handout given today.

## 2012-02-11 NOTE — Progress Notes (Signed)
History of Present Illness:  This is a very nice 56 year old Caucasian male with periodic acid reflux managed with an acid. He denies dysphagia, anorexia, weight loss, or hepatobiliary symptomatology. He is due for colonoscopy followup per previous adenomatous polyps. The patient has chronic atrial flutter ablation with previous attempts at ablation therapy by [facility] without success. Is not on Coumadin but does take daily aspirin. The patient denies melena, hematochezia, lower abdominal pain, or systemic complaints. 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 He also has sleep apnea but cannot tolerate a CPAP machine. Family history is remarkable for kidney cancer in his brother, but apparently not colon cancer.  I have reviewed this patient's present history, medical and surgical past history, allergies and medications.     ROS: The remainder of the 10 point ROS is negative     Physical Exam: Healthy appearing white male in no distress. Blood pressure 136/90, pulse 68 irregular, and BMI 24.28. General well developed well nourished patient in no acute distress, appearing his stated age Eyes PERRLA, no icterus, fundoscopic exam per opthamologist Skin no lesions noted Neck supple, no adenopathy, no thyroid enlargement, no tenderness Chest clear to percussion and auscultation Heart no significant murmurs, gallops or rubs noted Abdomen no hepatosplenomegaly masses or tenderness, BS normal.  Extremities no acute joint lesions, edema, phlebitis or evidence of cellulitis. Neurologic patient oriented x 3, cranial nerves intact, no focal neurologic deficits noted. Psychological mental status normal and normal affect.  Assessment and plan: Chronic GERD and a middle-aged Caucasian male, rule out Barrett's mucosa. I've scheduled endoscopic exam and placed him on daily Prilosec 20 mg. Standard  anti-reflex regime also reviewed. Colonoscopy has been scheduled at his convenience all salicylate therapy. I've also given him a list of nonabsorbable carbohydrates to avoid in his diet.  Encounter Diagnoses  Name Primary?  . Personal history of colonic polyps Yes  . Family history of malignant neoplasm of gastrointestinal tract

## 2012-03-23 ENCOUNTER — Encounter: Payer: 59 | Admitting: Gastroenterology

## 2012-03-28 ENCOUNTER — Encounter: Payer: 59 | Admitting: Gastroenterology

## 2012-05-23 ENCOUNTER — Ambulatory Visit (INDEPENDENT_AMBULATORY_CARE_PROVIDER_SITE_OTHER): Payer: 59 | Admitting: Family Medicine

## 2012-05-23 ENCOUNTER — Encounter: Payer: Self-pay | Admitting: Family Medicine

## 2012-05-23 VITALS — BP 152/100 | HR 87 | Temp 98.4°F | Ht 69.0 in | Wt 160.0 lb

## 2012-05-23 DIAGNOSIS — IMO0001 Reserved for inherently not codable concepts without codable children: Secondary | ICD-10-CM

## 2012-05-23 DIAGNOSIS — G4730 Sleep apnea, unspecified: Secondary | ICD-10-CM

## 2012-05-23 DIAGNOSIS — R030 Elevated blood-pressure reading, without diagnosis of hypertension: Secondary | ICD-10-CM

## 2012-05-23 DIAGNOSIS — I10 Essential (primary) hypertension: Secondary | ICD-10-CM | POA: Insufficient documentation

## 2012-05-23 LAB — COMPREHENSIVE METABOLIC PANEL
ALT: 26 U/L (ref 0–53)
AST: 28 U/L (ref 0–37)
Alkaline Phosphatase: 58 U/L (ref 39–117)
BUN: 19 mg/dL (ref 6–23)
Chloride: 104 mEq/L (ref 96–112)
Creat: 0.99 mg/dL (ref 0.50–1.35)
Total Bilirubin: 1.5 mg/dL — ABNORMAL HIGH (ref 0.3–1.2)

## 2012-05-23 NOTE — Patient Instructions (Addendum)
Keep cutting back on salt.  Try to take tyelonol instead of ibuprofen (will increase your blood pressure)   Consider buying a blood pressure machine - arm model - moderate cost Check your blood pressure every other day and write down you readings and take to Dr C.  I will call you if your tests are not good.  Otherwise I will send you a letter.  If you do not hear from me with in 2 weeks please call our office.

## 2012-05-23 NOTE — Assessment & Plan Note (Signed)
Intermittent.  Will check labs and have him monitor.  Asked him to show his readings and discuss with his cardiologist at an upcoming appointment

## 2012-05-23 NOTE — Progress Notes (Signed)
  Subjective:    Patient ID: Wayne Molina, male    DOB: 04-26-1956, 56 y.o.   MRN: [id number]  HPI  Pain between shoulder blades - on and off for a few weeks., mostly mild comes on with neck movement and stings/burns.  No UE weakness or numbness.  No trauma  Elevate Blood pressure Has had intermittenty before.  No chest pain or shortness of breath or edema.  Tries to watch salt intake. Has never been treated     Review of Systems Patient reports no  vision/ hearing changes,anorexia, weight change, fever ,adenopathy, persistant / recurrent hoarseness, swallowing issues, chest pain, edema,persistant / recurrent cough, hemoptysis, dyspnea(rest, exertional, paroxysmal nocturnal), gastrointestinal  bleeding (melena, rectal bleeding), abdominal pain, excessive heart burn, GU symptoms(dysuria, hematuria, pyuria, voiding/incontinence  Issues) syncope, focal weakness, severe memory loss, concerning skin lesions, depression, anxiety, abnormal bruising/bleeding, major joint swelling.      Objective:   Physical Exam  Neck:  No deformities, thyromegaly, masses, or tenderness noted.   Supple with full range of motion.  Patient notes slight pain between shoulder blades with right head rotation Neg Spurlings  Lungs:  Normal respiratory effort, chest expands symmetrically. Lungs are clear to auscultation, no crackles or wheezes. Heart - IrRegular rate and rhythm.  No murmurs, gallops or rubs.   Abdomen: soft and non-tender without masses, organomegaly or hernias noted.  No guarding or rebound Extremities:  No cyanosis, edema, or deformity noted with good range of motion of all major joints.   Back - no tenderness or deformity          Assessment & Plan:   Back pain - perhaps mild cervical impingement. No red flags for infection or cancer or fracture  Patient does not wish Prostate Cancer screenign as had recenlty at a mens health clinic

## 2012-05-24 ENCOUNTER — Encounter: Payer: Self-pay | Admitting: Family Medicine

## 2012-09-03 ENCOUNTER — Emergency Department (HOSPITAL_COMMUNITY): Payer: 59

## 2012-09-03 ENCOUNTER — Encounter (HOSPITAL_COMMUNITY): Payer: Self-pay | Admitting: Emergency Medicine

## 2012-09-03 ENCOUNTER — Other Ambulatory Visit: Payer: Self-pay

## 2012-09-03 ENCOUNTER — Emergency Department (HOSPITAL_COMMUNITY)
Admission: EM | Admit: 2012-09-03 | Discharge: 2012-09-03 | Disposition: A | Discharge status: Home Health | Payer: 59 | Attending: Emergency Medicine | Admitting: Emergency Medicine

## 2012-09-03 DIAGNOSIS — Z860100 Personal history of colon polyps, unspecified: Secondary | ICD-10-CM | POA: Insufficient documentation

## 2012-09-03 DIAGNOSIS — Z79899 Other long term (current) drug therapy: Secondary | ICD-10-CM | POA: Insufficient documentation

## 2012-09-03 DIAGNOSIS — G4730 Sleep apnea, unspecified: Secondary | ICD-10-CM | POA: Insufficient documentation

## 2012-09-03 DIAGNOSIS — R079 Chest pain, unspecified: Secondary | ICD-10-CM

## 2012-09-03 DIAGNOSIS — I4891 Unspecified atrial fibrillation: Secondary | ICD-10-CM | POA: Insufficient documentation

## 2012-09-03 DIAGNOSIS — R0789 Other chest pain: Secondary | ICD-10-CM | POA: Insufficient documentation

## 2012-09-03 DIAGNOSIS — M19049 Primary osteoarthritis, unspecified hand: Secondary | ICD-10-CM | POA: Insufficient documentation

## 2012-09-03 DIAGNOSIS — E785 Hyperlipidemia, unspecified: Secondary | ICD-10-CM | POA: Insufficient documentation

## 2012-09-03 DIAGNOSIS — I10 Essential (primary) hypertension: Secondary | ICD-10-CM | POA: Insufficient documentation

## 2012-09-03 DIAGNOSIS — Z7982 Long term (current) use of aspirin: Secondary | ICD-10-CM | POA: Insufficient documentation

## 2012-09-03 LAB — CBC WITH DIFFERENTIAL/PLATELET
Basophils Relative: 0 % (ref 0–1)
Eosinophils Absolute: 0.1 10*3/uL (ref 0.0–0.7)
HCT: 46 % (ref 39.0–52.0)
Hemoglobin: 16.6 g/dL (ref 13.0–17.0)
MCH: 31.8 pg (ref 26.0–34.0)
MCHC: 36.1 g/dL — ABNORMAL HIGH (ref 30.0–36.0)
Monocytes Absolute: 0.8 10*3/uL (ref 0.1–1.0)
Monocytes Relative: 10 % (ref 3–12)

## 2012-09-03 LAB — BASIC METABOLIC PANEL
BUN: 22 mg/dL (ref 6–23)
Chloride: 102 mEq/L (ref 96–112)
Creatinine, Ser: 1.03 mg/dL (ref 0.50–1.35)
GFR calc Af Amer: >90 mL/min (ref >90)
Glucose, Bld: 76 mg/dL (ref 70–99)

## 2012-09-03 NOTE — ED Notes (Signed)
Pt discharged to home. Pt voiced understanding of discharge instructions. NAD. Pain free.

## 2012-09-03 NOTE — ED Provider Notes (Addendum)
History     CSN: [id number]  Arrival date & time 09/03/12  4098   First MD Initiated Contact with Patient 09/03/12 1006      Chief Complaint  Patient presents with  . Chest Pain    (Consider location/radiation/quality/duration/timing/severity/associated sxs/prior treatment) The history is provided by the patient.   56 year old, male, with history of atrial fibrillation, presents emergency department complaining of chest discomfort, which he describes as a pressure sensation.  For the past several days.  It is not associated with lightheadedness, shortness of breath, nausea, vomiting, or sweating.  He denies leg pain or swelling.  He states that he exercises 5 times a week and does not get chest pain.  He does not smoke and has never had a heart attack.  He is not taking any anticoagulants.  Past Medical History  Diagnosis Date  . Anxiety   . Arthritis     fingers  . Depression   . Hyperlipidemia   . Hypertension   . Sleep apnea   . Personal history of colonic polyps 11/12/2006  . Family history of malignant neoplasm of gastrointestinal tract   . Chronic atrial fibrillation     Past Surgical History  Procedure Date  . Hip fracture surgery 1970s  . Atrial ablation surgery 2004    Family History  Problem Relation Age of Onset  . Colon cancer Other     Great grandmother  . Heart disease Father   . Colon cancer Maternal Uncle     History  Substance Use Topics  . Smoking status: Never Smoker   . Smokeless tobacco: Never Used  . Alcohol Use: No      Review of Systems  Constitutional: Negative for fever, chills and diaphoresis.  HENT: Negative for congestion.   Respiratory: Negative for cough, chest tightness and shortness of breath.   Cardiovascular: Positive for chest pain. Negative for leg swelling.       Chest pain, described as pressure  Gastrointestinal: Negative for nausea and vomiting.  Neurological: Negative for dizziness, light-headedness and headaches.    Hematological: Does not bruise/bleed easily.  Psychiatric/Behavioral: Negative for confusion.  All other systems reviewed and are negative.    Allergies  Review of patient's allergies indicates no known allergies.  Home Medications   Current Outpatient Rx  Name Route Sig Dispense Refill  . ASPIRIN 81 MG PO TABS Oral Take 81 mg by mouth daily.      . OMEGA-3 FATTY ACIDS 1000 MG PO CAPS Oral Take 1 g by mouth daily.    Marland Kitchen MOVIPREP 100 G PO SOLR Oral Take 1 kit (100 g total) by mouth once. 1 kit 0    Dispense as written.  . MULTIPLE VITAMIN PO Oral Take 1 tablet by mouth daily.    . NON FORMULARY  1 tablet daily. [place], [employer]    . DHEA PO Oral Take by mouth.    . OMEPRAZOLE 20 MG PO CPDR Oral Take 1 capsule (20 mg total) by mouth daily. 90 capsule 3  . VITAMIN B-12 250 MCG PO TABS Oral Take 250 mcg by mouth daily.      BP 138/84  Pulse 82  Resp 20  SpO2 99%  Physical Exam  Vitals reviewed. Constitutional: He is oriented to person, place, and time. He appears well-developed and well-nourished. No distress.  HENT:  Head: Normocephalic and atraumatic.  Eyes: Conjunctivae normal and EOM are normal.  Neck: Normal range of motion. Neck supple.  Cardiovascular: Normal  rate and intact distal pulses.   No murmur heard.      Irregular heartbeat  Pulmonary/Chest: Effort normal and breath sounds normal. No respiratory distress. He has no rales.  Abdominal: Soft. Bowel sounds are normal. He exhibits no distension.  Musculoskeletal: Normal range of motion. He exhibits no edema and no tenderness.  Neurological: He is alert and oriented to person, place, and time.  Skin: Skin is warm and dry.  Psychiatric: He has a normal mood and affect. Thought content normal.    ED Course  Procedures (including critical care time)   Labs Reviewed  CBC WITH DIFFERENTIAL  BASIC METABOLIC PANEL   No results found.   No diagnosis found.  ECG.  atrial fibrillation at 79 beats per  minute. Normal axis. Normal intervals. T-wave inversions in the lateral precordial leads, which is new compared to 02/12/2005  I spoke with the cardiologist on call for [facility].  We discussed hx of afib but no hx of acs.  We discussed h&P and ecg and lab tests, including fact that pt exercises 5 d/ w with no cp and that he had tw inversions in precordial lat leads compared to 2006.  I suggested  Pt does not need admission.  He agreed and will arrange for office reeval. This week.  Pt understands and agrees with plan.   MDM  Chest pain Atrial fibrillation        Cheri Guppy, MD 09/03/12 1044  Cheri Guppy, MD 09/03/12 1218

## 2012-09-03 NOTE — ED Notes (Signed)
Pt. Stated, I started having chest pain around 6pm, i thought it was just muscle from working out.  Contiuned this am

## 2013-04-13 ENCOUNTER — Other Ambulatory Visit: Payer: Self-pay | Admitting: Cardiovascular Disease

## 2013-05-04 ENCOUNTER — Telehealth: Payer: Self-pay | Admitting: Family Medicine

## 2013-05-04 NOTE — Telephone Encounter (Signed)
Error

## 2013-07-07 ENCOUNTER — Encounter: Payer: Self-pay | Admitting: Physician Assistant

## 2013-07-07 ENCOUNTER — Encounter: Payer: Self-pay | Admitting: Cardiovascular Disease

## 2013-07-11 ENCOUNTER — Ambulatory Visit (INDEPENDENT_AMBULATORY_CARE_PROVIDER_SITE_OTHER): Payer: 59 | Admitting: Cardiovascular Disease

## 2013-07-11 ENCOUNTER — Encounter: Payer: Self-pay | Admitting: Cardiovascular Disease

## 2013-07-11 VITALS — BP 128/82 | HR 71 | Ht 69.0 in | Wt 163.4 lb

## 2013-07-11 DIAGNOSIS — I429 Cardiomyopathy, unspecified: Secondary | ICD-10-CM | POA: Insufficient documentation

## 2013-07-11 DIAGNOSIS — G4730 Sleep apnea, unspecified: Secondary | ICD-10-CM

## 2013-07-11 DIAGNOSIS — I4891 Unspecified atrial fibrillation: Secondary | ICD-10-CM

## 2013-07-11 DIAGNOSIS — I428 Other cardiomyopathies: Secondary | ICD-10-CM

## 2013-07-11 DIAGNOSIS — I10 Essential (primary) hypertension: Secondary | ICD-10-CM

## 2013-07-11 NOTE — Progress Notes (Signed)
Patient ID: NICHOLAS TROMPETER, male   DOB: [birth year], 57 y.o.   MRN: [id number]     Reason for office visit Atrial fibrillation  The patient is a 57 year old gentleman that has longstanding permanent atrial fibrillation treated with chronic anticoagulation, not requiring rate control medications. He has obstructive sleep apnea but has not been able to tolerate CPAP.  He has recently been diagnosed with systemic hypertension which has been treated with losartan Monotherapy. He has electrocardiographic changes suggestive of left ventricular hypertrophy that preceded the diagnosis of hypertension for several years. There is at most borderline left ventricular hypertrophy by echo.  He continues to be very physically active. He goes to the gym 3 days a week where he does both aerobic activity and weights, and he walks 5-6 days a week. He is completely asymptomatic and denies any dizziness, lightheadedness, palpitations, shortness of breath, chest pain, syncope, focal neurological deficits, intermittent claudication, erectile dysfunction or other problems.  No Known Allergies  Current Outpatient Prescriptions  Medication Sig Dispense Refill  . aspirin 81 MG tablet Take 81 mg by mouth daily.        . fish oil-omega-3 fatty acids 1000 MG capsule Take 1 g by mouth daily.      Marland Kitchen losartan (COZAAR) 59 MG tablet TAKE 1 TABLET BY MOUTH DAILY  30 tablet  6  . MULTIPLE VITAMIN PO Take 1 tablet by mouth daily.      . NON FORMULARY Take 1 tablet by mouth daily. Long Jax, Horny Goat Weed       No current facility-administered medications for this visit.    Past Medical History  Diagnosis Date  . Anxiety   . Arthritis     fingers  . Depression   . Hyperlipidemia   . Hypertension   . Sleep apnea   . Personal history of colonic polyps 11/12/2006  . Family history of malignant neoplasm of gastrointestinal tract   . Chronic atrial fibrillation     Past Surgical History  Procedure Laterality Date  . Hip  fracture surgery  1970s  . Atrial ablation surgery  2004  . 2-d echocardiogram  07/22/2010    Ejection fraction greater than 55%. Left atrium moderately dilated. Right atrium moderately dilated. 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 Mild to Moderate MR. Mild to moderate TR.  Marland Kitchen Exercise myoview stress test  07/08/2000    Nonischemic low-risk.    Family History  Problem Relation Age of Onset  . Colon cancer Other     Great grandmother  . Heart disease Father   . Colon cancer Maternal Uncle     History   Social History  . Marital Status: Divorced    Spouse Name: N/A    Number of Children: N/A  . Years of Education: N/A   Occupational History  . Not on file.   Social History Main Topics  . Smoking status: Never Smoker   . Smokeless tobacco: Never Used  . Alcohol Use: No  . Drug Use: No  . Sexual Activity: Yes   Other Topics Concern  . Not on file   Social History Narrative   Single has girlfriend.  Works at [employer]    Review of systems: The patient specifically denies any chest pain at rest or with exertion, dyspnea at rest or with exertion, orthopnea, paroxysmal nocturnal dyspnea, syncope, palpitations, focal neurological deficits, intermittent claudication, lower extremity edema, unexplained weight gain, cough, hemoptysis or wheezing.  The patient also denies abdominal pain, nausea, vomiting, dysphagia, diarrhea,  constipation, polyuria, polydipsia, dysuria, hematuria, frequency, urgency, abnormal bleeding or bruising, fever, chills, unexpected weight changes, mood swings, change in skin or hair texture, change in voice quality, auditory or visual problems, allergic reactions or rashes, new musculoskeletal complaints other than usual "aches and pains".   PHYSICAL EXAM BP 128/82  Pulse 71  Ht 5\' 9"  (1.753 m)  Wt 163 lb 6.4 oz (74.118 kg)  BMI 24.12 kg/m2  General: Alert, oriented x3, no distress; he is very athletic Head: no evidence of trauma, PERRL, EOMI, no  exophtalmos or lid lag, no myxedema, no xanthelasma; normal ears, nose and oropharynx Neck: normal jugular venous pulsations and no hepatojugular reflux; brisk carotid pulses without delay and no carotid bruits Chest: clear to auscultation, no signs of consolidation by percussion or palpation, normal fremitus, symmetrical and full respiratory excursions Cardiovascular: normal position and quality of the apical impulse, irregular rhythm, normal first and second heart sounds, no murmurs, rubs or gallops Abdomen: no tenderness or distention, no masses by palpation, no abnormal pulsatility or arterial bruits, normal bowel sounds, no hepatosplenomegaly Extremities: no clubbing, cyanosis or edema; 2+ radial, ulnar and brachial pulses bilaterally; 2+ right femoral, posterior tibial and dorsalis pedis pulses; 2+ left femoral, posterior tibial and dorsalis pedis pulses; no subclavian or femoral bruitsir Neurological: grossly nonfocal   EKG: Atrial fibrillation, left ventricular hypertrophy with repolarization abnormalities, no change  Lipid Panel     Component Value Date/Time   CHOL 165 02/21/2009 2039   TRIG 100 02/21/2009 2039   HDL 40 02/21/2009 2039   CHOLHDL 4.1 Ratio 02/21/2009 2039   VLDL 20 02/21/2009 2039   LDLCALC 105* 02/21/2009 2039    BMET    Component Value Date/Time   NA 136 09/03/2012 0950   K 4.4 09/03/2012 0950   CL 102 09/03/2012 0950   CO2 25 09/03/2012 0950   GLUCOSE 76 09/03/2012 0950   BUN 22 09/03/2012 0950   CREATININE 1.03 09/03/2012 0950   CREATININE 0.99 05/23/2012 1118   CALCIUM 9.7 09/03/2012 0950   GFRNONAA 78* 09/03/2012 0950   GFRAA >90 09/03/2012 0950     ASSESSMENT AND PLAN Cardiomyopathy The exact type of cardiomyopathy is uncertain, but I believe 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 He has early onset atrial fibrillation, evidence of conduction disease since he has a normal heart rate despite the absence of AV nodal blockade, ECG  changes of LVH. He does not have any clinical evidence of ventricular arrhythmia or heart failure. He has preserved left ventricle systolic function. Pharmacological therapy does not appear to be indicated. Also do not see a clear reason to restrict his physical activity although I believe aerobic activity is preferable to heavy weightlifting.  Essential hypertension Well managed.  Sleep apnea: unable to tolerate CPAP    Atrial fibrillation He does not have a history of diabetes, congestive heart failure or any embolic events/stroke/TIA. His only additional risk factor for stroke is systemic hypertension. Aspirin appears to be sufficient for embolism prevention at this time.   Orders Placed This Encounter  Procedures  . EKG 12-Lead   No orders of the defined types were placed in this encounter.    Junious Silk, MD, [facility] [phone] office [phone] pager

## 2013-07-11 NOTE — Assessment & Plan Note (Signed)
Well managed 

## 2013-07-11 NOTE — Assessment & Plan Note (Signed)
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 He has early onset atrial fibrillation, evidence of conduction disease since he has a normal heart rate despite the absence of AV nodal blockade, ECG changes of LVH. He does not have any clinical evidence of ventricular arrhythmia or heart failure. He has preserved left ventricle systolic function. Pharmacological therapy does not appear to be indicated. Also do not see a clear reason to restrict his physical activity although I believe aerobic activity is preferable to heavy weightlifting.

## 2013-07-11 NOTE — Assessment & Plan Note (Signed)
He does not have a history of diabetes, congestive heart failure or any embolic events/stroke/TIA. His only additional risk factor for stroke is systemic hypertension. Aspirin appears to be sufficient for embolism prevention at this time.

## 2013-07-11 NOTE — Patient Instructions (Addendum)
Your physician recommends that you schedule a follow-up appointment in: 12 months.  

## 2013-11-07 ENCOUNTER — Emergency Department (INDEPENDENT_AMBULATORY_CARE_PROVIDER_SITE_OTHER)
Admission: EM | Admit: 2013-11-07 | Discharge: 2013-11-07 | Disposition: A | Discharge status: Home / Self Care | Payer: 59 | Source: Home / Self Care

## 2013-11-07 ENCOUNTER — Encounter (HOSPITAL_COMMUNITY): Payer: Self-pay | Admitting: Emergency Medicine

## 2013-11-07 DIAGNOSIS — R059 Cough, unspecified: Secondary | ICD-10-CM

## 2013-11-07 DIAGNOSIS — J069 Acute upper respiratory infection, unspecified: Secondary | ICD-10-CM

## 2013-11-07 DIAGNOSIS — R0982 Postnasal drip: Secondary | ICD-10-CM

## 2013-11-07 DIAGNOSIS — R05 Cough: Secondary | ICD-10-CM

## 2013-11-07 DIAGNOSIS — J019 Acute sinusitis, unspecified: Secondary | ICD-10-CM

## 2013-11-07 MED ORDER — FEXOFENADINE HCL 180 MG PO TABS: 180.0000 mg | ORAL_TABLET | Freq: Every day | ORAL | Status: DC

## 2013-11-07 MED ORDER — GUAIFENESIN-CODEINE 100-10 MG/5ML PO SYRP: ORAL_SOLUTION | ORAL | Status: DC

## 2013-11-07 NOTE — Discharge Instructions (Signed)
Cough, Adult  A cough is a reflex that helps clear your throat and airways. It can help heal the body or may be a reaction to an irritated airway. A cough may only last 2 or 3 weeks (acute) or may last more than 8 weeks (chronic).  CAUSES Acute cough:  Viral or bacterial infections. Chronic cough:  Infections.  Allergies.  Asthma.  Post-nasal drip.  Smoking.  Heartburn or acid reflux.  Some medicines.  Chronic lung problems (COPD).  Cancer. SYMPTOMS   Cough.  Fever.  Chest pain.  Increased breathing rate.  High-pitched whistling sound when breathing (wheezing).  Colored mucus that you cough up (sputum). TREATMENT   A bacterial cough may be treated with antibiotic medicine.  A viral cough must run its course and will not respond to antibiotics.  Your caregiver may recommend other treatments if you have a chronic cough. HOME CARE INSTRUCTIONS   Only take over-the-counter or prescription medicines for pain, discomfort, or fever as directed by your caregiver. Use cough suppressants only as directed by your caregiver.  Use a cold steam vaporizer or humidifier in your bedroom or home to help loosen secretions.  Sleep in a semi-upright position if your cough is worse at night.  Rest as needed.  Stop smoking if you smoke. SEEK IMMEDIATE MEDICAL CARE IF:   You have pus in your sputum.  Your cough starts to worsen.  You cannot control your cough with suppressants and are losing sleep.  You begin coughing up blood.  You have difficulty breathing.  You develop pain which is getting worse or is uncontrolled with medicine.  You have a fever. MAKE SURE YOU:   Understand these instructions.  Will watch your condition.  Will get help right away if you are not doing well or get worse. Document Released: 04/24/2011 Document Revised: 01/18/2012 Document Reviewed: 04/24/2011 ExitCare Patient Information 2014 [employer].  Upper Respiratory Infection,  Adult An upper respiratory infection (URI) is also sometimes known as the common cold. The upper respiratory tract includes the nose, sinuses, throat, trachea, and bronchi. Bronchi are the airways leading to the lungs. Most people improve within 1 week, but symptoms can last up to 2 weeks. A residual cough may last even longer.  CAUSES Many different viruses can infect the tissues lining the upper respiratory tract. The tissues become irritated and inflamed and often become very moist. Mucus production is also common. A cold is contagious. You can easily spread the virus to others by oral contact. This includes kissing, sharing a glass, coughing, or sneezing. Touching your mouth or nose and then touching a surface, which is then touched by another person, can also spread the virus. SYMPTOMS  Symptoms typically develop 1 to 3 days after you come in contact with a cold virus. Symptoms vary from person to person. They may include:  Runny nose.  Sneezing.  Nasal congestion.  Sinus irritation.  Sore throat.  Loss of voice (laryngitis).  Cough.  Fatigue.  Muscle aches.  Loss of appetite.  Headache.  Low-grade fever. DIAGNOSIS  You might diagnose your own cold based on familiar symptoms, since most people get a cold 2 to 3 times a year. Your caregiver can confirm this based on your exam. Most importantly, your caregiver can check that your symptoms are not due to another disease such as strep throat, sinusitis, pneumonia, asthma, or epiglottitis. Blood tests, throat tests, and X-rays are not necessary to diagnose a common cold, but they may sometimes be helpful   in excluding other more serious diseases. Your caregiver will decide if any further tests are required. RISKS AND COMPLICATIONS  You may be at risk for a more severe case of the common cold if you smoke cigarettes, have chronic heart disease (such as heart failure) or lung disease (such as asthma), or if you have a weakened immune  system. The very young and very old are also at risk for more serious infections. Bacterial sinusitis, middle ear infections, and bacterial pneumonia can complicate the common cold. The common cold can worsen asthma and chronic obstructive pulmonary disease (COPD). Sometimes, these complications can require emergency medical care and may be life-threatening. PREVENTION  The best way to protect against getting a cold is to practice good hygiene. Avoid oral or hand contact with people with cold symptoms. Wash your hands often if contact occurs. There is no clear evidence that vitamin C, vitamin E, echinacea, or exercise reduces the chance of developing a cold. However, it is always recommended to get plenty of rest and practice good nutrition. TREATMENT  Treatment is directed at relieving symptoms. There is no cure. Antibiotics are not effective, because the infection is caused by a virus, not by bacteria. Treatment may include:  Increased fluid intake. Sports drinks offer valuable electrolytes, sugars, and fluids.  Breathing heated mist or steam (vaporizer or shower).  Eating chicken soup or other clear broths, and maintaining good nutrition.  Getting plenty of rest.  Using gargles or lozenges for comfort.  Controlling fevers with ibuprofen or acetaminophen as directed by your caregiver.  Increasing usage of your inhaler if you have asthma. Zinc gel and zinc lozenges, taken in the first 24 hours of the common cold, can shorten the duration and lessen the severity of symptoms. Pain medicines may help with fever, muscle aches, and throat pain. A variety of non-prescription medicines are available to treat congestion and runny nose. Your caregiver can make recommendations and may suggest nasal or lung inhalers for other symptoms.  HOME CARE INSTRUCTIONS   Only take over-the-counter or prescription medicines for pain, discomfort, or fever as directed by your caregiver.  Use a warm mist humidifier  or inhale steam from a shower to increase air moisture. This may keep secretions moist and make it easier to breathe.  Drink enough water and fluids to keep your urine clear or pale yellow.  Rest as needed.  Return to work when your temperature has returned to normal or as your caregiver advises. You may need to stay home longer to avoid infecting others. You can also use a face mask and careful hand washing to prevent spread of the virus. SEEK MEDICAL CARE IF:   After the first few days, you feel you are getting worse rather than better.  You need your caregiver's advice about medicines to control symptoms.  You develop chills, worsening shortness of breath, or brown or red sputum. These may be signs of pneumonia.  You develop yellow or brown nasal discharge or pain in the face, especially when you bend forward. These may be signs of sinusitis.  You develop a fever, swollen neck glands, pain with swallowing, or white areas in the back of your throat. These may be signs of strep throat. SEEK IMMEDIATE MEDICAL CARE IF:   You have a fever.  You develop severe or persistent headache, ear pain, sinus pain, or chest pain.  You develop wheezing, a prolonged cough, cough up blood, or have a change in your usual mucus (if you have chronic   lung disease).  You develop sore muscles or a stiff neck. Document Released: 04/21/2001 Document Revised: 01/18/2012 Document Reviewed: 02/27/2011 Exit[employer] Patient Information 2014 [employer].  

## 2013-11-07 NOTE — ED Provider Notes (Signed)
Medical screening examination/treatment/procedure(s) were performed by non-physician practitioner and as supervising physician I was immediately available for consultation/collaboration.  Jatniel Verastegui, M.D.  Nigil Braman C Kiahna Banghart, MD 11/07/13 1533 

## 2013-11-07 NOTE — ED Notes (Signed)
C/o cough with mucous, nasal drainage, constipation, and headache that started last Friday. Denies any n/v/d. Stated he has taken Robitussin, Advil, and Daquil with no relief. Denies any pain at the moment. Written by: Marga Melnick

## 2013-11-07 NOTE — ED Provider Notes (Signed)
CSN: [id number]     Arrival date & time 11/07/13  7829 History   First MD Initiated Contact with Patient 11/07/13 [phone]     Chief Complaint  Patient presents with  . URI   (Consider location/radiation/quality/duration/timing/severity/associated sxs/prior Treatment) HPI Comments: 57 year old male states that he has a cough is worse at night but also occurs during the day. He has head and chest congestion and a headache. Denies fever, sore throat, earache or shortness of breath. He is a nonsmoker. He works at [employer] and is around neonate. He pulled something to knock this cold out before the weekend so he will in fact his work partner.   Past Medical History  Diagnosis Date  . Anxiety   . Arthritis     fingers  . Depression   . Hyperlipidemia   . Hypertension   . Sleep apnea   . Personal history of colonic polyps 11/12/2006  . Family history of malignant neoplasm of gastrointestinal tract   . Chronic atrial fibrillation    Past Surgical History  Procedure Laterality Date  . Hip fracture surgery  1970s  . Atrial ablation surgery  2004  . 2-d echocardiogram  07/22/2010    Ejection fraction greater than 55%. Left atrium moderately dilated. Right atrium moderately dilated. 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 Mild to Moderate MR. Mild to moderate TR.  Marland Kitchen Exercise myoview stress test  07/08/2000    Nonischemic low-risk.   Family History  Problem Relation Age of Onset  . Colon cancer Other     Great grandmother  . Heart disease Father   . Colon cancer Maternal Uncle    History  Substance Use Topics  . Smoking status: Never Smoker   . Smokeless tobacco: Never Used  . Alcohol Use: No    Review of Systems  Constitutional: Negative for fever, diaphoresis, activity change and fatigue.  HENT: Positive for congestion, postnasal drip, rhinorrhea, sore throat and trouble swallowing. Negative for ear pain and facial swelling.   Eyes: Negative for pain, discharge and redness.   Respiratory: Positive for cough. Negative for chest tightness, shortness of breath and wheezing.   Cardiovascular: Negative.   Gastrointestinal: Negative.   Musculoskeletal: Negative.  Negative for neck pain and neck stiffness.  Skin: Negative for rash.  Neurological: Negative.     Allergies  Review of patient's allergies indicates no known allergies.  Home Medications   Current Outpatient Rx  Name  Route  Sig  Dispense  Refill  . losartan (COZAAR) 50 MG tablet      TAKE 1 TABLET BY MOUTH DAILY   30 tablet   6   . aspirin 81 MG tablet   Oral   Take 81 mg by mouth daily.           . fexofenadine (ALLEGRA) 180 MG tablet   Oral   Take 1 tablet (180 mg total) by mouth daily.   14 tablet   0     Disp stock bottle, OTC   . fish oil-omega-3 fatty acids 1000 MG capsule   Oral   Take 1 g by mouth daily.         Marland Kitchen guaiFENesin-codeine (CHERATUSSIN AC) 100-10 MG/5ML syrup      5ml to 10 ml po q 4 h prn cough and congestion.   120 mL   0   . MULTIPLE VITAMIN PO   Oral   Take 1 tablet by mouth daily.         . NON FORMULARY  Oral   Take 1 tablet by mouth daily. Long Jax, Horny Goat Weed          BP 138/90  Pulse 84  Temp(Src) 98 F (36.7 C) (Oral)  Resp 18  SpO2 98% Physical Exam  Nursing note and vitals reviewed. Constitutional: He is oriented to person, place, and time. He appears well-developed and well-nourished. No distress.  HENT:  Mouth/Throat: No oropharyngeal exudate.  Bilateral TMs with minor retraction but no erythema.' Oropharynx mildly injected with copious clear PND  Eyes: Conjunctivae and EOM are normal.  Neck: Normal range of motion. Neck supple.  Cardiovascular: Normal rate and intact distal pulses.   Regularly irregular with apical pulse of 92  Pulmonary/Chest: Effort normal and breath sounds normal. No respiratory distress. He has no wheezes. He has no rales.  Musculoskeletal: Normal range of motion. He exhibits no edema.   Lymphadenopathy:    He has no cervical adenopathy.  Neurological: He is alert and oriented to person, place, and time.  Skin: Skin is warm and dry. No rash noted.  Psychiatric: He has a normal mood and affect.    ED Course  Procedures (including critical care time) Labs Review Labs Reviewed - No data to display Imaging Review No results found.    MDM   1. Acute rhinosinusitis   2. URI (upper respiratory infection)   3. Cough   4. PND (post-nasal drip)      Allegra 180 mg daily when necessary drainage Cheratussin a.c. 1-2 teaspoons every 4 hours when necessary cough Wear a mask at work Off work tomorrow Drink plenty of fluids stay well hydrated Did not take decongestants as your are ventricular response to A. fib is at  upper limits of normal.  Hayden Rasmussen, NP 11/07/13 [phone]

## 2013-11-10 ENCOUNTER — Encounter (HOSPITAL_COMMUNITY): Payer: Self-pay | Admitting: Emergency Medicine

## 2013-11-10 ENCOUNTER — Emergency Department (INDEPENDENT_AMBULATORY_CARE_PROVIDER_SITE_OTHER)
Admission: EM | Admit: 2013-11-10 | Discharge: 2013-11-10 | Disposition: A | Discharge status: Home / Self Care | Payer: 59 | Source: Home / Self Care | Attending: Family Medicine | Admitting: Family Medicine

## 2013-11-10 DIAGNOSIS — J069 Acute upper respiratory infection, unspecified: Secondary | ICD-10-CM

## 2013-11-10 DIAGNOSIS — S39012A Strain of muscle, fascia and tendon of lower back, initial encounter: Secondary | ICD-10-CM

## 2013-11-10 DIAGNOSIS — S335XXA Sprain of ligaments of lumbar spine, initial encounter: Secondary | ICD-10-CM

## 2013-11-10 MED ORDER — HYDROCOD POLST-CHLORPHEN POLST 10-8 MG/5ML PO LQCR: 5.0000 mL | Freq: Two times a day (BID) | ORAL | Status: DC | PRN

## 2013-11-10 MED ORDER — IPRATROPIUM BROMIDE 0.06 % NA SOLN: 2.0000 | Freq: Four times a day (QID) | NASAL | Status: DC

## 2013-11-10 MED ORDER — CYCLOBENZAPRINE HCL 5 MG PO TABS: 5.0000 mg | ORAL_TABLET | Freq: Three times a day (TID) | ORAL | Status: DC | PRN

## 2013-11-10 NOTE — Discharge Instructions (Signed)
Take your bp meds, drink plenty of fluids, heat to back , see your doctor as needed.

## 2013-11-10 NOTE — ED Notes (Signed)
Pt  Reports      Back    Pain        -      When  He  Coughed        -  He  Was   Seen     ucc   3  Days          Ago       He reports  Pain       Is worse on  Coughing  Moving  / deep breathing   He  Ambulated  To room  With a  Steady  Fluid  Gait      He  Is   Sitting       Upright      On  Exam table       Speaking in  Complete  sentances    Except  For  The  Odd  Cough

## 2013-11-10 NOTE — ED Provider Notes (Signed)
CSN: [id number]     Arrival date & time 11/10/13  0801 History   First MD Initiated Contact with Patient 11/10/13 0815     Chief Complaint  Patient presents with  . URI   (Consider location/radiation/quality/duration/timing/severity/associated sxs/prior Treatment) Patient is a 58 y.o. male presenting with URI. The history is provided by the patient.  URI Presenting symptoms: congestion, cough and rhinorrhea   Presenting symptoms: no fever   Severity:  Moderate Onset quality:  Gradual Duration:  3 days Progression:  Worsening Chronicity:  New Associated symptoms: myalgias   Associated symptoms comment:  Back strain assoc with hard cough spell yest, no radicular pain.ok when not coughing.   Past Medical History  Diagnosis Date  . Anxiety   . Arthritis     fingers  . Depression   . Hyperlipidemia   . Hypertension   . Sleep apnea   . Personal history of colonic polyps 11/12/2006  . Family history of malignant neoplasm of gastrointestinal tract   . Chronic atrial fibrillation    Past Surgical History  Procedure Laterality Date  . Hip fracture surgery  1970s  . Atrial ablation surgery  2004  . 2-d echocardiogram  07/22/2010    Ejection fraction greater than 55%. Left atrium moderately dilated. Right atrium moderately dilated. 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 Mild to Moderate MR. Mild to moderate TR.  Marland Kitchen Exercise myoview stress test  07/08/2000    Nonischemic low-risk.   Family History  Problem Relation Age of Onset  . Colon cancer Other     Great grandmother  . Heart disease Father   . Colon cancer Maternal Uncle    History  Substance Use Topics  . Smoking status: Never Smoker   . Smokeless tobacco: Never Used  . Alcohol Use: No    Review of Systems  Constitutional: Negative.  Negative for fever.  HENT: Positive for congestion, postnasal drip and rhinorrhea.   Respiratory: Positive for cough.   Musculoskeletal: Positive for myalgias.    Allergies  Review of  patient's allergies indicates no known allergies.  Home Medications   Current Outpatient Rx  Name  Route  Sig  Dispense  Refill  . aspirin 81 MG tablet   Oral   Take 81 mg by mouth daily.           . chlorpheniramine-HYDROcodone (TUSSIONEX PENNKINETIC ER) 10-8 MG/5ML LQCR   Oral   Take 5 mLs by mouth every 12 (twelve) hours as needed for cough.   115 mL   0   . cyclobenzaprine (FLEXERIL) 5 MG tablet   Oral   Take 1 tablet (5 mg total) by mouth 3 (three) times daily as needed for muscle spasms.   30 tablet   0   . fexofenadine (ALLEGRA) 180 MG tablet   Oral   Take 1 tablet (180 mg total) by mouth daily.   14 tablet   0     Disp stock bottle, OTC   . fish oil-omega-3 fatty acids 1000 MG capsule   Oral   Take 1 g by mouth daily.         Marland Kitchen guaiFENesin-codeine (CHERATUSSIN AC) 100-10 MG/5ML syrup      63ml to 10 ml po q 4 h prn cough and congestion.   120 mL   0   . ipratropium (ATROVENT) 0.06 % nasal spray   Nasal   Place 2 sprays into the nose 4 (four) times daily.   15 mL   1   .  losartan (COZAAR) 50 MG tablet      TAKE 1 TABLET BY MOUTH DAILY   30 tablet   6   . MULTIPLE VITAMIN PO   Oral   Take 1 tablet by mouth daily.         . NON FORMULARY   Oral   Take 1 tablet by mouth daily. [place], Horny Goat Weed          BP 180/122  Pulse 78  Temp(Src) 98.6 F (37 C) (Oral)  Resp 18  SpO2 98% Physical Exam  Nursing note and vitals reviewed. Constitutional: He is oriented to person, place, and time. He appears well-developed and well-nourished.  HENT:  Head: Normocephalic.  Right Ear: External ear normal.  Left Ear: External ear normal.  Mouth/Throat: Oropharynx is clear and moist.  Eyes: Pupils are equal, round, and reactive to light.  Neck: Normal range of motion. Neck supple.  Pulmonary/Chest: Breath sounds normal.  Musculoskeletal: He exhibits tenderness.       Lumbar back: He exhibits decreased range of motion, tenderness, pain and  spasm. He exhibits normal pulse.       Back:  Lymphadenopathy:    He has cervical adenopathy.  Neurological: He is alert and oriented to person, place, and time.  Skin: Skin is warm and dry.    ED Course  Procedures (including critical care time) Labs Review Labs Reviewed - No data to display Imaging Review No results found.  EKG Interpretation    Date/Time:    Ventricular Rate:    PR Interval:    QRS Duration:   QT Interval:    QTC Calculation:   R Axis:     Text Interpretation:              MDM      Linna HoffJames D Armonte Tortorella, MD 11/10/13 [phone]

## 2013-11-16 ENCOUNTER — Ambulatory Visit: Payer: 59 | Admitting: Family Medicine

## 2013-12-06 ENCOUNTER — Encounter: Payer: Self-pay | Admitting: Family Medicine

## 2013-12-06 ENCOUNTER — Ambulatory Visit (INDEPENDENT_AMBULATORY_CARE_PROVIDER_SITE_OTHER): Payer: 59 | Admitting: Family Medicine

## 2013-12-06 VITALS — BP 154/94 | HR 71 | Temp 97.7°F | Ht 69.0 in | Wt 166.0 lb

## 2013-12-06 DIAGNOSIS — I4891 Unspecified atrial fibrillation: Secondary | ICD-10-CM

## 2013-12-06 DIAGNOSIS — G4730 Sleep apnea, unspecified: Secondary | ICD-10-CM

## 2013-12-06 DIAGNOSIS — Z13220 Encounter for screening for lipoid disorders: Secondary | ICD-10-CM | POA: Insufficient documentation

## 2013-12-06 DIAGNOSIS — I10 Essential (primary) hypertension: Secondary | ICD-10-CM

## 2013-12-06 LAB — BASIC METABOLIC PANEL
BUN: 18 mg/dL (ref 6–23)
CALCIUM: 10 mg/dL (ref 8.4–10.5)
CHLORIDE: 102 meq/L (ref 96–112)
CO2: 27 mEq/L (ref 19–32)
CREATININE: 0.85 mg/dL (ref 0.50–1.35)
Glucose, Bld: 92 mg/dL (ref 70–99)
Potassium: 4.3 mEq/L (ref 3.5–5.3)
SODIUM: 138 meq/L (ref 135–145)

## 2013-12-06 LAB — LIPID PANEL
CHOL/HDL RATIO: 4.1 ratio
Cholesterol: 195 mg/dL (ref 0–200)
HDL: 47 mg/dL (ref >39)
LDL Cholesterol: 134 mg/dL — ABNORMAL HIGH (ref 0–99)
TRIGLYCERIDES: 70 mg/dL (ref <150)
VLDL: 14 mg/dL (ref 0–40)

## 2013-12-06 NOTE — Progress Notes (Signed)
   Subjective:    Patient ID: Wayne Molina, male    DOB: Feb 18, 1956, 58 y.o.   MRN: [id number]  HPI For CPE.  Feels well except for occasional musculoskeletal  Pains  AFIB - seen by cardiology in Sept.  On Asa only.  Does not cause him any symptoms  HYPERTENSION Disease Monitoring Home BP Monitoring not checking Chest pain- no    Dyspnea- no Medications Compliance-  Daily losartan - did take today. Lightheadedness-  no  Edema- no ROS - See HPI  PMH Lab Review   Potassium  Date Value Range Status  09/03/2012 4.4  3.5 - 5.1 mEq/L Final     Sodium  Date Value Range Status  09/03/2012 136  135 - 145 mEq/L Final     Creat  Date Value Range Status  05/23/2012 0.99  0.50 - 1.35 mg/dL Final     Creatinine, Ser  Date Value Range Status  09/03/2012 1.03  0.50 - 1.35 mg/dL Final       Patient reports no  vision/ hearing changes,anorexia, weight change, fever ,adenopathy, persistant / recurrent hoarseness, swallowing issues, chest pain, edema,persistant / recurrent cough, hemoptysis, dyspnea(rest, exertional, paroxysmal nocturnal), gastrointestinal  bleeding (melena, rectal bleeding), abdominal pain, excessive heart burn, GU symptoms(dysuria, hematuria, pyuria, voiding/incontinence  Issues) syncope, focal weakness, severe memory loss, concerning skin lesions, depression, anxiety, abnormal bruising/bleeding, major joint swelling.     Review of Systems     Objective:   Physical Exam Alert no acute distress Heart - IRRegular rate and rhythm.  No murmurs, gallops or rubs.    Lungs:  Normal respiratory effort, chest expands symmetrically. Lungs are clear to auscultation, no crackles or wheezes. Extremities:  No cyanosis, edema, or deformity noted with good range of motion of all major joints.           Assessment & Plan:

## 2013-12-06 NOTE — Assessment & Plan Note (Signed)
Well controlled 

## 2013-12-06 NOTE — Assessment & Plan Note (Signed)
May need to readdress if blood pressure not controllable

## 2013-12-06 NOTE — Patient Instructions (Signed)
Good to see you today!  Thanks for coming in.  Check blood pressure regularly You want your blood pressure to be less than 140/90.   If not then call   I will call you if your tests are not good.  Otherwise I will send you a letter.  If you do not hear from me with in 2 weeks please call our office.     Follow up with your colonoscopy

## 2013-12-06 NOTE — Assessment & Plan Note (Signed)
Not at goal.  He will monitior at home and call if not controlled

## 2013-12-07 ENCOUNTER — Encounter: Payer: Self-pay | Admitting: Family Medicine

## 2013-12-12 ENCOUNTER — Encounter: Payer: Self-pay | Admitting: Family Medicine

## 2013-12-15 ENCOUNTER — Telehealth: Payer: Self-pay | Admitting: Family Medicine

## 2013-12-15 NOTE — Telephone Encounter (Signed)
Patient would like know if he could start taking BP meds 2x daily to help keep BP down? Please advise.

## 2013-12-18 NOTE — Telephone Encounter (Signed)
It could be since his cardiologist prescribed it Coozar he should contact them  Thanks  LC

## 2013-12-18 NOTE — Telephone Encounter (Signed)
LM for pt to call back.  Please inform him to contact his cardiologist since they are the ones who prescribed medication. Thanks [employer]

## 2013-12-20 ENCOUNTER — Encounter: Payer: Self-pay | Admitting: Cardiovascular Disease

## 2013-12-27 ENCOUNTER — Ambulatory Visit: Payer: 59 | Admitting: Family Medicine

## 2014-01-03 ENCOUNTER — Ambulatory Visit (INDEPENDENT_AMBULATORY_CARE_PROVIDER_SITE_OTHER): Payer: 59 | Admitting: Family Medicine

## 2014-01-03 ENCOUNTER — Encounter: Payer: Self-pay | Admitting: Family Medicine

## 2014-01-03 VITALS — BP 160/116 | HR 89 | Ht 69.0 in | Wt 169.0 lb

## 2014-01-03 DIAGNOSIS — I10 Essential (primary) hypertension: Secondary | ICD-10-CM

## 2014-01-03 DIAGNOSIS — M549 Dorsalgia, unspecified: Secondary | ICD-10-CM

## 2014-01-03 MED ORDER — TRAMADOL HCL 50 MG PO TABS: 50.0000 mg | ORAL_TABLET | Freq: Three times a day (TID) | ORAL | Status: DC | PRN

## 2014-01-03 NOTE — Patient Instructions (Signed)
Good to see you today!  Thanks for coming in.  Try the ultram as needed for pain.  You can also take tylenol up to three times daily  An occasional Ibuprofen is ok but do not take regularly it can increase your blood pressure  Send Dr C your blood pressure reading you may need to go up on your Coozar  If the back pain is not better or is worse in 2-3 weeks call and we will get an xray

## 2014-01-03 NOTE — Progress Notes (Signed)
   Subjective:    Patient ID: Wayne Molina, male    DOB: 1956/01/10, 58 y.o.   MRN: [id number]  HPI  BACK PAIN  Location: R lower back Quality: achy Onset: gradual after slept on cot at work Worse with: movement Better with: ibuprofen taking 2-4 per day and wearing a back belt Radiation: no Trauma: no  Red Flags Fecal/urinary incontinence: no  Numbness/Weakness: no  Fever/chills/sweats: no  Night pain: no  Unexplained weight loss: no  No relief with bedrest: no  h/o cancer/immunosuppression: no  IV drug use: no  PMH of osteoporosis or chronic steroid use: no   PMH - has had transient low backpain in past  HYPERTENSION Disease Monitoring Home BP Monitoring has been running high when he checks he is to send reading to Dr C his cardiologist who may increasehis coozar Chest pain- no    Dyspnea- no Medications Compliance-  One daily coozar. Lightheadedness-  no  Edema- no ROS - See HPI  PMH Lab Review   Potassium  Date Value Ref Range Status  12/06/2013 4.3  3.5 - 5.3 mEq/L Final     Sodium  Date Value Ref Range Status  12/06/2013 138  135 - 145 mEq/L Final     Creat  Date Value Ref Range Status  12/06/2013 0.85  0.50 - 1.35 mg/dL Final     Creatinine, Ser  Date Value Ref Range Status  09/03/2012 1.03  0.50 - 1.35 mg/dL Final     Review of Symptoms - see HPI  PMH - Smoking status noted.        Review of Systems     Objective:   Physical Exam Alert no acute distress wearing back belt Good range of motion of back with mild pain at extremes Able to walk on heels and toes and do deep knee bend No SLR pain  Back - Normal skin, Spine with normal alignment and no deformity.  No tenderness to vertebral process palpation.  Paraspinous muscles are mildly tendermore so on R and without spasm.    Heart - Regular rate and rhythm.  No murmurs, gallops or rubs.          Assessment & Plan:

## 2014-01-03 NOTE — Assessment & Plan Note (Addendum)
Worsened today likely due to pain and NSAID use.  He will contact his Dr Wayne Molina with his readings and perhaps medication increase.   Discussed with Wayne Molina that may need to investigate other treatment for SleepApnea if blood pressure remains hard to control

## 2014-01-03 NOTE — Assessment & Plan Note (Signed)
Acute likely musculoskeletal.  No signs of fracture or cancer or infection.   Wil try tramadol since NSAID use could be worsening his hypertension Given age would get xray if not improving soon

## 2014-02-20 ENCOUNTER — Encounter: Payer: Self-pay | Admitting: Family Medicine

## 2014-03-05 ENCOUNTER — Encounter: Payer: Self-pay | Admitting: Family Medicine

## 2014-03-05 ENCOUNTER — Ambulatory Visit (INDEPENDENT_AMBULATORY_CARE_PROVIDER_SITE_OTHER): Payer: 59 | Admitting: Family Medicine

## 2014-03-05 ENCOUNTER — Ambulatory Visit (HOSPITAL_COMMUNITY)
Admission: RE | Admit: 2014-03-05 | Discharge: 2014-03-05 | Disposition: A | Discharge status: Home / Self Care | Payer: 59 | Source: Ambulatory Visit | Attending: Family Medicine | Admitting: Family Medicine

## 2014-03-05 ENCOUNTER — Telehealth: Payer: Self-pay | Admitting: Family Medicine

## 2014-03-05 VITALS — BP 150/96 | HR 81 | Temp 97.9°F | Wt 165.0 lb

## 2014-03-05 DIAGNOSIS — M25561 Pain in right knee: Secondary | ICD-10-CM

## 2014-03-05 DIAGNOSIS — M898X9 Other specified disorders of bone, unspecified site: Secondary | ICD-10-CM | POA: Insufficient documentation

## 2014-03-05 DIAGNOSIS — M25569 Pain in unspecified knee: Secondary | ICD-10-CM | POA: Insufficient documentation

## 2014-03-05 MED ORDER — IBUPROFEN 800 MG PO TABS: 800.0000 mg | ORAL_TABLET | Freq: Three times a day (TID) | ORAL | Status: DC | PRN

## 2014-03-05 NOTE — Progress Notes (Signed)
   Subjective:    Patient ID: KRISZTIAN SIMONETTI, male    DOB: 15-Dec-1955, 58 y.o.   MRN: [id number]  Knee Pain    Right Knee Pain Onset: several weeks ago Location: right anterior medial knee Quality: aching Severity: mild to moderate Function: not impairing work or sleep Duration: several weeks Pattern: worse towards end of day Course: stable Radiation: none Relief: Ibuprofen Precipitant: No acute injury event Associated Symptoms: no fever, no rash, no other joint pains or swellings.  Trauma (Acute or Chronic): no acute.  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  Prior Diagnostic Testing or Treatments: none.       Review of Systems See hpi     Objective:   Physical Exam  Vitals reviewed. Constitutional: He appears well-developed. No distress.  Musculoskeletal:       Right knee: He exhibits normal range of motion, no swelling, no effusion, no deformity, no erythema, no LCL laxity, normal patellar mobility, no bony tenderness and normal meniscus. Tenderness found. Medial joint line tenderness noted. No lateral joint line, no MCL, no LCL and no patellar tendon tenderness noted.          Assessment & Plan:

## 2014-03-05 NOTE — Patient Instructions (Signed)
We are checking X-rays today of your right knee to see if you have changes in the bone of osteoarthritis.  Start Acetaminophen 1000 mg three times a day (scheduled) to get a constant level of pain control of knee pain.  When pain becomes moderate or severe, you may take Ibuprofen 800 mg up to twice a day.  Gentle resistance training of medial quadracep muscles and calf muscle.         Wear and Tear Disorders of the Knee (Arthritis, Osteoarthritis) Everyone will experience wear and tear injuries (arthritis, osteoarthritis) of the knee. These are the changes we all get as we age. They come from the joint stress of daily living. The amount of cartilage damage in your knee and your symptoms determine if you need surgery. Mild problems require approximately two months recovery time. More severe problems take several months to recover. With mild problems, your surgeon may find worn and rough cartilage surfaces. With severe changes, your surgeon may find cartilage that has completely worn away and exposed the bone. Loose bodies of bone and cartilage, bone spurs (excess bone growth), and injuries to the menisci (cushions between the large bones of your leg) are also common. All of these problems can cause pain. For a mild wear and tear problem, rough cartilage may simply need to be shaved and smoothed. For more severe problems with areas of exposed bone, your surgeon may use an instrument for roughing up the bone surfaces to stimulate new cartilage growth. Loose bodies are usually removed. Torn menisci may be trimmed or repaired. ABOUT THE ARTHROSCOPIC PROCEDURE Arthroscopy is a surgical technique. It allows your orthopedic surgeon to diagnose and treat your knee injury with accuracy. The surgeon looks into your knee through a small scope. The scope is like a small (pencil-sized) telescope. Arthroscopy is less invasive than open knee surgery. You can expect a more rapid recovery. After the procedure, you  will be moved to a recovery area until most of the effects of the medication have worn off. Your caregiver will discuss the test results with you. RECOVERY The severity of the arthritis and the type of procedure performed will determine recovery time. Other important factors include age, physical condition, medical conditions, and the type of rehabilitation program. Strengthening your muscles after arthroscopy helps guarantee a better recovery. Follow your caregiver's instructions. Use crutches, rest, elevate, ice, and do knee exercises as instructed. Your caregivers will help you and instruct you with exercises and other physical therapy required to regain your mobility, muscle strength, and functioning following surgery. Only take over-the-counter or prescription medicines for pain, discomfort, or fever as directed by your caregiver.  SEEK MEDICAL CARE IF:   There is increased bleeding (more than a small spot) from the wound.  You notice redness, swelling, or increasing pain in the wound.  Pus is coming from wound.  You develop an unexplained oral temperature above 102 F (38.9 C) , or as your caregiver suggests.  You notice a foul smell coming from the wound or dressing.  You have severe pain with motion of the knee. SEEK IMMEDIATE MEDICAL CARE IF:   You develop a rash.  You have difficulty breathing.  You have any allergic problems. MAKE SURE YOU:   Understand these instructions.  Will watch your condition.  Will get help right away if you are not doing well or get worse. Document Released: 10/23/2000 Document Revised: 01/18/2012 Document Reviewed: 03/21/2008 [facility] Patient Information 2014 [place], [place].

## 2014-03-05 NOTE — Telephone Encounter (Signed)
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 Plan APAP schedule with as needed Ibuprofen 800 mg breakthrough pain.

## 2014-03-15 ENCOUNTER — Encounter: Payer: Self-pay | Admitting: Cardiovascular Disease

## 2014-03-15 NOTE — Telephone Encounter (Signed)
Will forward message to Dr. Royann Shivers

## 2014-03-23 ENCOUNTER — Other Ambulatory Visit: Payer: Self-pay | Admitting: Cardiovascular Disease

## 2014-03-23 NOTE — Telephone Encounter (Signed)
Rx refill sent to patient pharmacy   

## 2014-06-17 ENCOUNTER — Encounter (HOSPITAL_COMMUNITY): Payer: Self-pay | Admitting: Emergency Medicine

## 2014-06-17 ENCOUNTER — Emergency Department (INDEPENDENT_AMBULATORY_CARE_PROVIDER_SITE_OTHER)
Admission: EM | Admit: 2014-06-17 | Discharge: 2014-06-17 | Disposition: A | Discharge status: Home / Self Care | Payer: 59 | Source: Home / Self Care | Attending: Emergency Medicine | Admitting: Emergency Medicine

## 2014-06-17 DIAGNOSIS — T63461A Toxic effect of venom of wasps, accidental (unintentional), initial encounter: Secondary | ICD-10-CM

## 2014-06-17 DIAGNOSIS — T63451A Toxic effect of venom of hornets, accidental (unintentional), initial encounter: Secondary | ICD-10-CM

## 2014-06-17 DIAGNOSIS — Y92009 Unspecified place in unspecified non-institutional (private) residence as the place of occurrence of the external cause: Secondary | ICD-10-CM

## 2014-06-17 DIAGNOSIS — T6391XA Toxic effect of contact with unspecified venomous animal, accidental (unintentional), initial encounter: Secondary | ICD-10-CM

## 2014-06-17 MED ORDER — HYDROXYZINE HCL 25 MG PO TABS: 25.0000 mg | ORAL_TABLET | Freq: Four times a day (QID) | ORAL | Status: DC | PRN

## 2014-06-17 MED ORDER — METHYLPREDNISOLONE ACETATE 80 MG/ML IJ SUSP
80.0000 mg | Freq: Once | INTRAMUSCULAR | Status: AC
  Administered 2014-06-17: 80 mg via INTRAMUSCULAR

## 2014-06-17 MED ORDER — METHYLPREDNISOLONE ACETATE 80 MG/ML IJ SUSP
INTRAMUSCULAR | Status: AC
Start: 2014-06-17 — End: 2014-06-17
  Filled 2014-06-17: qty 1

## 2014-06-17 MED ORDER — CEPHALEXIN 500 MG PO CAPS: 500.0000 mg | ORAL_CAPSULE | Freq: Three times a day (TID) | ORAL | Status: DC

## 2014-06-17 MED ORDER — PREDNISONE 20 MG PO TABS: 20.0000 mg | ORAL_TABLET | Freq: Two times a day (BID) | ORAL | Status: DC

## 2014-06-17 NOTE — ED Provider Notes (Signed)
Chief Complaint    Chief Complaint  Patient presents with  . Insect Bite    History of Present Illness      Wayne Molina is a 58-year-old male who was stung by what he thinks was a hornet yesterday on his right calf while at home. Ever since then he's had pain rated 3/10 in intensity in the calf, ankle, and foot, swelling, and itching. He denies any fever or chills. He's had no hives or generalized rash. He denies any difficulty breathing, wheezing, or coughing. No swelling of the lips, tongue, or throat. He has been stung by wasps previously but has not reacted like this.  Review of Systems   Other than as noted above, the patient denies any of the following symptoms: Systemic:  No fever or chills. ENT:  No nasal congestion, rhinorrhea, sore throat, swelling of lips, tongue or throat. Resp:  No cough, wheezing, or shortness of breath.  PMFSH    Past medical history, family history, social history, meds, and allergies were reviewed. He has sleep apnea nature fibrillation. He takes several blood pressure medications.  Physical Exam     Vital signs:  BP 164/96  Pulse 72  Temp(Src) 98.4 F (36.9 C) (Oral)  SpO2 98% Gen:  Alert, oriented, in no distress. ENT:  Pharynx clear, no intraoral lesions, moist mucous membranes. Lungs:  Clear to auscultation. Skin:  There is a small scabbed over area on the right posterior calf where he was stung. There is no visible stinger. He has redness, erythema, and swelling of the calf, ankle, and foot. Pedal pulses were not felt that I was able to Doppler a good pedal pulse. He has good capillary refill, normal sensation, and full range of motion with good muscle strength and no pain on movement.  Course in Urgent Care Center     The following meds were given:  Medications  methylPREDNISolone acetate (DEPO-MEDROL) injection 80 mg (80 mg Intramuscular Given 06/17/14 1724)   Assessment    The primary encounter diagnosis was Hornet sting,  accidental or unintentional, initial encounter. A diagnosis of Place of occurrence, home was also pertinent to this visit.  This is a severe local reaction to a hornet sting. There is no evidence of systemic allergic reaction, so I do not think he needs an EpiPen or to be referred to an allergist. Will cover with cephalexin, just in case this is infected, although unlikely since he does not have a fever.  Plan     1.  Meds:  The following meds were prescribed:   Discharge Medication List as of 06/17/2014  5:12 PM    START taking these medications   Details  cephALEXin (KEFLEX) 500 MG capsule Take 1 capsule (500 mg total) by mouth 3 (three) times daily., Starting 06/17/2014, Until Discontinued, Normal    hydrOXYzine (ATARAX/VISTARIL) 25 MG tablet Take 1 tablet (25 mg total) by mouth every 6 (six) hours as needed for itching., Starting 06/17/2014, Until Discontinued, Normal    predniSONE (DELTASONE) 20 MG tablet Take 1 tablet (20 mg total) by mouth 2 (two) times daily., Starting 06/17/2014, Until Discontinued, Normal        2.  Patient Education/Counseling:  The patient was given appropriate handouts, self care instructions, and instructed in symptomatic relief.    3.  Follow up:  The patient was told to follow up here if no better in 3 to 4 days, or sooner if becoming worse in any way, and given some red flag  symptoms such as worsening rash, fever, or difficulty breathing which would prompt immediate return.  Follow up here if necessary.      Reuben Likesavid C Cataleyah Colborn, MD 06/17/14 [phone]

## 2014-06-17 NOTE — ED Notes (Signed)
C/o insect bite/sting on Saturday afternoon States a bee/hornet came into his house and stung him on his right foot The foot is swollen and red

## 2014-06-17 NOTE — Discharge Instructions (Signed)

## 2014-07-11 ENCOUNTER — Encounter: Payer: Self-pay | Admitting: Cardiovascular Disease

## 2014-07-11 ENCOUNTER — Ambulatory Visit (INDEPENDENT_AMBULATORY_CARE_PROVIDER_SITE_OTHER): Payer: 59 | Admitting: Cardiovascular Disease

## 2014-07-11 VITALS — BP 155/96 | HR 62 | Ht 69.0 in | Wt 164.2 lb

## 2014-07-11 DIAGNOSIS — I10 Essential (primary) hypertension: Secondary | ICD-10-CM

## 2014-07-11 DIAGNOSIS — I4891 Unspecified atrial fibrillation: Secondary | ICD-10-CM

## 2014-07-11 DIAGNOSIS — I429 Cardiomyopathy, unspecified: Secondary | ICD-10-CM

## 2014-07-11 DIAGNOSIS — I482 Chronic atrial fibrillation, unspecified: Secondary | ICD-10-CM

## 2014-07-11 DIAGNOSIS — I428 Other cardiomyopathies: Secondary | ICD-10-CM

## 2014-07-11 DIAGNOSIS — G4730 Sleep apnea, unspecified: Secondary | ICD-10-CM

## 2014-07-11 MED ORDER — LOSARTAN POTASSIUM 100 MG PO TABS: 50.0000 mg | ORAL_TABLET | Freq: Every day | ORAL | Status: DC

## 2014-07-11 NOTE — Progress Notes (Signed)
Patient ID: Wayne Molina, male   DOB: 04/02/1956, 58 y.o.   MRN: [id number]      Reason for office visit Permanent atrial fibrillation, hypertension  Wayne Molina is a physically fit 58 year old man with long-standing permanent atrial fibrillation with spontaneously controlled rate, recently diagnosed hypertension and obstructive sleep apnea (intolerant of CPAP). 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  He has no cardiac complaints and specifically denies exertional dyspnea, chest pain, syncope or focal neurological deficits. He exercises on a regular basis and is remodeling his bathroom doing fairly heavy flooring work. He does not feel physically limited. He has had a variety of musculoskeletal complaints and has been on looking at the bee stung by hornets a couple of times this year.  When he last saw Dr. Deirdre Priest his blood pressure was high and his physician suggested increasing his losartan to twice daily   No Known Allergies  Outpatient Encounter Prescriptions as of 07/11/2014  Medication Sig  . aspirin 81 MG tablet Take 81 mg by mouth daily.    . fish oil-omega-3 fatty acids 1000 MG capsule Take 1 g by mouth daily.  Marland Kitchen ibuprofen (ADVIL,MOTRIN) 800 MG tablet Take 1 tablet (800 mg total) by mouth every 8 (eight) hours as needed. Take with food.  Marland Kitchen MAGNESIUM OXIDE PO Take 1 tablet by mouth daily.  . MULTIPLE VITAMIN PO Take 1 tablet by mouth daily.  . Potassium (POTASSIMIN PO) Take 1 tablet by mouth daily.  Marland Kitchen losartan (COZAAR) 50 MG tablet TAKE 1 TABLET BY MOUTH DAILY     Past Medical History  Diagnosis Date  . Anxiety   . Arthritis     fingers  . Depression   . Hyperlipidemia   . Hypertension   . Sleep apnea   . Personal history of colonic polyps 11/12/2006  . Family history of malignant neoplasm of gastrointestinal tract   . Chronic atrial fibrillation      Past Surgical History  Procedure Laterality Date  . Hip fracture surgery  1970s  . Atrial ablation surgery  2004  . 2-d echocardiogram  07/22/2010    Ejection fraction greater than 55%. Left atrium moderately dilated. Right atrium moderately dilated. 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 Mild to Moderate MR. Mild to moderate TR.  Marland Kitchen Exercise myoview stress test  07/08/2000    Nonischemic low-risk.    Family History  Problem Relation Age of Onset  . Colon cancer Other     Great grandmother  . Heart disease Father   . Colon cancer Maternal Uncle     History   Social History  . Marital Status: Divorced    Spouse Name: N/A    Number of Children: N/A  . Years of Education: N/A   Occupational History  . Not on file.   Social History Main Topics  . Smoking status: Never Smoker   . Smokeless tobacco: Never Used  . Alcohol Use: No  . Drug Use: No  . Sexual Activity: Yes   Other Topics Concern  . Not on file   Social History Narrative   Single has girlfriend.  Works at [employer]    Review of systems: The patient specifically denies any chest pain at rest or with exertion, dyspnea at rest or with exertion, orthopnea, paroxysmal nocturnal dyspnea, syncope, palpitations, focal neurological deficits, intermittent claudication, lower extremity edema, unexplained weight gain, cough, hemoptysis or wheezing.  The patient also  denies abdominal pain, nausea, vomiting, dysphagia, diarrhea, constipation, polyuria, polydipsia, dysuria, hematuria, frequency, urgency, abnormal bleeding or bruising, fever, chills, unexpected weight changes, mood swings, change in skin or hair texture, change in voice quality, auditory or visual problems, allergic reactions or rashes, new musculoskeletal complaints other than usual "aches and pains".   PHYSICAL EXAM BP 155/96  Pulse 62  Ht  (1.753 m)  Wt 164 lb 3.2 oz (74.481 kg)  BMI 24.24 kg/m2  General: Alert, oriented x3, no distress Head:  no evidence of trauma, PERRL, EOMI, no exophtalmos or lid lag, no myxedema, no xanthelasma; normal ears, nose and oropharynx Neck: normal jugular venous pulsations and no hepatojugular reflux; brisk carotid pulses without delay and no carotid bruits Chest: clear to auscultation, no signs of consolidation by percussion or palpation, normal fremitus, symmetrical and full respiratory excursions Cardiovascular: normal position and quality of the apical impulse, irregular rhythm, normal first and second heart sounds, no murmurs, rubs or gallops Abdomen: no tenderness or distention, no masses by palpation, no abnormal pulsatility or arterial bruits, normal bowel sounds, no hepatosplenomegaly Extremities: no clubbing, cyanosis or edema; 2+ radial, ulnar and brachial pulses bilaterally; 2+ right femoral, posterior tibial and dorsalis pedis pulses; 2+ left femoral, posterior tibial and dorsalis pedis pulses; no subclavian or femoral bruits Neurological: grossly nonfocal   EKG: Atrial fibrillation, LVH with prominent T wave inversion across the anterior precordium in the inferior leads, QTC 438 ms. ECG is not meaningfully change from previous tracings as far back as 2001.  Lipid Panel     Component Value Date/Time   CHOL 195 12/06/2013 1112   TRIG 70 12/06/2013 1112   HDL 47 12/06/2013 1112   CHOLHDL 4.1 12/06/2013 1112   VLDL 14 12/06/2013 1112   LDLCALC 134* 12/06/2013 1112    BMET    Component Value Date/Time   NA 138 12/06/2013 1112   K 4.3 12/06/2013 1112   CL 102 12/06/2013 1112   CO2 27 12/06/2013 1112   GLUCOSE 92 12/06/2013 1112   BUN 18 12/06/2013 1112   CREATININE 0.85 12/06/2013 1112   CREATININE 1.03 09/03/2012 0950   CALCIUM 10.0 12/06/2013 1112   GFRNONAA 78* 09/03/2012 0950   GFRAA >90 09/03/2012 0950     ASSESSMENT AND PLAN  Wayne Molina has asymptomatic long-standing permanent atrial fibrillation with spontaneously controlled ventricular rate. He does not have a history of stroke/TIA  and other than recently diagnosed hypertension does not have other risk factors for embolic events. He has biatrial dilatation by echo and a chronically abnormal electrocardiogram, but no other structural cardiac abnormalities.  I agree with Dr. Deirdre Priest' recommendation to increase losartan to 50 mg twice a day. Additional adjustments in his medications may be necessary. He will send [place] some blood pressure recordings by MyChart.  Continue aspirin for embolism prevention.  No orders of the defined types were placed in this encounter.   Meds ordered this encounter  Medications  . MAGNESIUM OXIDE PO    Sig: Take 1 tablet by mouth daily.  . Potassium (POTASSIMIN PO)    Sig: Take 1 tablet by mouth daily.    Wayne Silk, MD, [facility] [phone] office [phone] pager

## 2014-07-11 NOTE — Patient Instructions (Signed)
Your physician recommends that you schedule a follow-up appointment in: 1 year  Your physician has recommended you make the following change in your medication: Take Losartan 50 mg twice daily  Keep a regular check on your blood pressure

## 2014-07-17 ENCOUNTER — Ambulatory Visit (INDEPENDENT_AMBULATORY_CARE_PROVIDER_SITE_OTHER): Payer: 59 | Admitting: Family Medicine

## 2014-07-17 ENCOUNTER — Encounter: Payer: Self-pay | Admitting: Family Medicine

## 2014-07-17 VITALS — BP 157/89 | HR 66 | Temp 98.0°F | Ht 69.0 in | Wt 161.0 lb

## 2014-07-17 DIAGNOSIS — R252 Cramp and spasm: Secondary | ICD-10-CM | POA: Insufficient documentation

## 2014-07-17 LAB — BASIC METABOLIC PANEL
BUN: 19 mg/dL (ref 6–23)
CALCIUM: 9.6 mg/dL (ref 8.4–10.5)
CO2: 27 meq/L (ref 19–32)
CREATININE: 1.02 mg/dL (ref 0.50–1.35)
Chloride: 104 mEq/L (ref 96–112)
GLUCOSE: 96 mg/dL (ref 70–99)
Potassium: 4.6 mEq/L (ref 3.5–5.3)
Sodium: 138 mEq/L (ref 135–145)

## 2014-07-17 LAB — FERRITIN: Ferritin: 399 ng/mL — ABNORMAL HIGH (ref 22–322)

## 2014-07-17 NOTE — Progress Notes (Signed)
Wayne Molina is a 58 y.o. male who presents today for leg cramps.  Pt states leg cramps have been ongoing now for about the last 2.5 weeks.  He has had these before and they have been self-limiting, happening about two times in the past year, each of lasting about 1 week at a time.  They are located on both legs, severe in nature, worse at night that awakening him from sleep, desire to move his legs at this time and awakening w/ stretching does help.  He denies any back pain, paresthesias, dysesthesias, weakness in his legs, bowel/bladder problems, melena/hematochezia, fever, chills, weight loss.  He has not tried anything for this at this time period.     Past Medical History  Diagnosis Date  . Anxiety   . Arthritis     fingers  . Depression   . Hyperlipidemia   . Hypertension   . Sleep apnea   . Personal history of colonic polyps 11/12/2006  . Family history of malignant neoplasm of gastrointestinal tract   . Chronic atrial fibrillation     History  Smoking status  . Never Smoker   Smokeless tobacco  . Never Used    Family History  Problem Relation Age of Onset  . Colon cancer Other     Great grandmother  . Heart disease Father   . Colon cancer Maternal Uncle     Current Outpatient Prescriptions on File Prior to Visit  Medication Sig Dispense Refill  . aspirin 81 MG tablet Take 81 mg by mouth daily.        . fish oil-omega-3 fatty acids 1000 MG capsule Take 1 g by mouth daily.      Marland Kitchen ibuprofen (ADVIL,MOTRIN) 800 MG tablet Take 1 tablet (800 mg total) by mouth every 8 (eight) hours as needed. Take with food.  30 tablet  3  . losartan (COZAAR) 100 MG tablet Take 0.5 tablets (50 mg total) by mouth daily.  30 tablet  9  . MAGNESIUM OXIDE PO Take 1 tablet by mouth daily.      . MULTIPLE VITAMIN PO Take 1 tablet by mouth daily.      . Potassium (POTASSIMIN PO) Take 1 tablet by mouth daily.       No current facility-administered medications on file prior to visit.    ROS: Per  HPI.  All other systems reviewed and are negative.   Physical Exam Filed Vitals:   07/17/14 0933  BP: 157/89  Pulse: 66  Temp: 98 F (36.7 C)    Physical Examination: General appearance - alert, well appearing, and in no distress Neurological - DTR's normal and symmetric, Babinski sign negative, motor and sensory grossly normal bilaterally, normal muscle tone, no tremors, strength 5/5 Vascular - PT/DP + 2 B/L LE    Chemistry      Component Value Date/Time   NA 138 12/06/2013 1112   K 4.3 12/06/2013 1112   CL 102 12/06/2013 1112   CO2 27 12/06/2013 1112   BUN 18 12/06/2013 1112   CREATININE 0.85 12/06/2013 1112   CREATININE 1.03 09/03/2012 0950      Component Value Date/Time   CALCIUM 10.0 12/06/2013 1112   ALKPHOS 58 05/23/2012 1118   AST 28 05/23/2012 1118   ALT 26 05/23/2012 1118   BILITOT 1.5* 05/23/2012 1118

## 2014-07-17 NOTE — Patient Instructions (Signed)
Please make an appointment for next week.   We will discuss your lab work and start you on a medication called Mirapex if your lab work looks normal.  Thanks, Dr. Paulina Fusi   Restless Legs Syndrome Restless legs syndrome is a movement disorder. It may also be called a sensorimotor disorder.  CAUSES  No one knows what specifically causes restless legs syndrome, but it tends to run in families. It is also more common in people with low iron, in pregnancy, in people who need dialysis, and those with nerve damage (neuropathy).Some medications may make restless legs syndrome worse.Those medications include drugs to treat high blood pressure, some heart conditions, nausea, colds, allergies, and depression. SYMPTOMS Symptoms include uncomfortable sensations in the legs. These leg sensations are worse during periods of inactivity or rest. They are also worse while sitting or lying down. Individuals that have the disorder describe sensations in the legs that feel like:  Pulling.  Drawing.  Crawling.  Worming.  Boring.  Tingling.  Pins and needles.  Prickling.  Pain. The sensations are usually accompanied by an overwhelming urge to move the legs. Sudden muscle jerks may also occur. Movement provides temporary relief from the discomfort. In rare cases, the arms may also be affected. Symptoms may interfere with going to sleep (sleep onset insomnia). Restless legs syndrome may also be related to periodic limb movement disorder (PLMD). PLMD is another more common motor disorder. It also causes interrupted sleep. The symptoms from PLMD usually occur most often when you are awake. TREATMENT  Treatment for restless legs syndrome is symptomatic. This means that the symptoms are treated.   Massage and cold compresses may provide temporary relief.  Walk, stretch, or take a cold or hot bath.  Get regular exercise and a good night's sleep.  Avoid caffeine, alcohol, nicotine, and medications that can  make it worse.  Do activities that provide mental stimulation like discussions, needlework, and video games. These may be helpful if you are not able to walk or stretch. Some medications are effective in relieving the symptoms. However, many of these medications have side effects. Ask your caregiver about medications that may help your symptoms. Correcting iron deficiency may improve symptoms for some patients. Document Released: 10/16/2002 Document Revised: 03/12/2014 Document Reviewed: 01/22/2011 [facility] Patient Information 2015 [place], [place]. This information is not intended to replace advice given to you by your health care provider. Make sure you discuss any questions you have with your health care provider.

## 2014-07-17 NOTE — Assessment & Plan Note (Signed)
Description sounds like RLS at this point.  Will get BMET and Ferritin to r/o secondary causes.  Will see back in a week and try on Mirapex 0.125 mg qhs to see if this helps.  May increase to 0.5 mg/day and if no improvement with this, will consider evaluation from a lumbar back region.   Other things on ddx would include PAD, periodic limb movement disorder, peripheral neuropathy.

## 2014-07-18 ENCOUNTER — Encounter: Payer: Self-pay | Admitting: Family Medicine

## 2014-08-30 ENCOUNTER — Telehealth: Payer: Self-pay | Admitting: Cardiovascular Disease

## 2014-08-30 MED ORDER — LOSARTAN POTASSIUM 50 MG PO TABS: 50.0000 mg | ORAL_TABLET | Freq: Two times a day (BID) | ORAL | Status: DC

## 2014-08-30 NOTE — Telephone Encounter (Signed)
She need to know what is the correct dose for his Losartan?

## 2014-08-30 NOTE — Telephone Encounter (Signed)
Patient is to be taking losartan 50mg  BID (per last OV 9/2) Phoned in Rx

## 2015-01-23 ENCOUNTER — Encounter: Payer: Self-pay | Admitting: Family Medicine

## 2015-01-23 ENCOUNTER — Ambulatory Visit (INDEPENDENT_AMBULATORY_CARE_PROVIDER_SITE_OTHER): Payer: 59 | Admitting: Family Medicine

## 2015-01-23 VITALS — BP 136/96 | HR 93 | Temp 98.3°F | Ht 69.25 in | Wt 165.1 lb

## 2015-01-23 DIAGNOSIS — I10 Essential (primary) hypertension: Secondary | ICD-10-CM

## 2015-01-23 DIAGNOSIS — Z114 Encounter for screening for human immunodeficiency virus [HIV]: Secondary | ICD-10-CM

## 2015-01-23 DIAGNOSIS — Z1159 Encounter for screening for other viral diseases: Secondary | ICD-10-CM

## 2015-01-23 LAB — HEPATITIS C ANTIBODY: HCV Ab: NEGATIVE

## 2015-01-23 MED ORDER — LOSARTAN POTASSIUM 100 MG PO TABS: 50.0000 mg | ORAL_TABLET | Freq: Every day | ORAL | Status: DC

## 2015-01-23 NOTE — Assessment & Plan Note (Signed)
Not controlled.  Change losartan to 100 mg once daily since having trouble remembering to take twice daily

## 2015-01-23 NOTE — Patient Instructions (Signed)
Good to see you today!  Thanks for coming in.  Take losartan 100 mg once a day - either 2 tabs once or 1 twice daily but take it every day  Use hydrocortisone ointment on the dry spot as needed  Call about the shingle shot  Come back in 1 year  Keep up the exercise  I will call you if your tests are not good.  Otherwise I will send you a letter.  If you do not hear from me with in 2 weeks please call our office.

## 2015-01-23 NOTE — Progress Notes (Signed)
   Subjective:    Patient ID: Wayne Molina, male    DOB: 1956/04/17, 59 y.o.   MRN: [id number]  HPI  Generally feels well  Has a dry scaly spot on left thigh for many months.  Comes an goes  Has noticed a few asymptomatic white spots on his penis  BP - taking lorsartan 50 mg only once a day instead of twice daily hard to remember  Patient reports no  vision/ hearing changes,anorexia, weight change, fever ,adenopathy, persistant / recurrent hoarseness, swallowing issues, chest pain, edema,persistant / recurrent cough, hemoptysis, dyspnea(rest, exertional, paroxysmal nocturnal), gastrointestinal  bleeding (melena, rectal bleeding), abdominal pain, excessive heart burn, GU symptoms(dysuria, hematuria, pyuria, voiding/incontinence  Issues) syncope, focal weakness, severe memory loss, concerning skin lesions, depression, anxiety, abnormal bruising/bleeding, major joint swelling.    Chief Complaint noted Review of Symptoms - see HPI PMH - Smoking status noted.   Vital Signs reviewed   Review of Systems     Objective:   Physical Exam  Alert no acute distress Heart - Regular rate and rhythm.  No murmurs, gallops or rubs.    Lungs:  Normal respiratory effort, chest expands symmetrically. Lungs are clear to auscultation, no crackles or wheezes. Abdomen: soft and non-tender without masses, organomegaly or hernias noted.  No guarding or rebound Neck:  No deformities, thyromegaly, masses, or tenderness noted.   Supple with full range of motion without pain. Extremities:  No cyanosis, edema, or deformity noted with good range of motion of all major joints.   Skin - vague scaly patch 1-2 cm on left thigh.    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  No signs of cancer  Penis cysts - reassured   Up to date on HM

## 2015-01-24 ENCOUNTER — Encounter: Payer: Self-pay | Admitting: Family Medicine

## 2015-01-24 LAB — HIV ANTIBODY (ROUTINE TESTING W REFLEX): HIV: NONREACTIVE

## 2015-03-25 ENCOUNTER — Encounter: Payer: Self-pay | Admitting: Internal Medicine

## 2015-05-21 ENCOUNTER — Ambulatory Visit (AMBULATORY_SURGERY_CENTER): Payer: Self-pay

## 2015-05-21 VITALS — Ht 69.0 in | Wt 163.6 lb

## 2015-05-21 DIAGNOSIS — Z860100 Personal history of colon polyps, unspecified: Secondary | ICD-10-CM

## 2015-05-21 NOTE — Progress Notes (Signed)
No allergies to eggs or soy No home oxygen No diet/weight loss meds No past problems with anesthesia EXCEPT PONV IN 1970'S WITH HIP SURGERY PROBABLY GENERAL ANESTHESIA  Does not want to watch emmi video

## 2015-06-04 ENCOUNTER — Ambulatory Visit (AMBULATORY_SURGERY_CENTER): Payer: 59 | Admitting: Internal Medicine

## 2015-06-04 ENCOUNTER — Encounter: Payer: Self-pay | Admitting: Internal Medicine

## 2015-06-04 VITALS — BP 106/73 | HR 73 | Temp 96.0°F | Resp 19 | Ht 69.0 in | Wt 163.0 lb

## 2015-06-04 DIAGNOSIS — Z86010 Personal history of colonic polyps: Secondary | ICD-10-CM | POA: Diagnosis not present

## 2015-06-04 MED ORDER — SODIUM CHLORIDE 0.9 % IV SOLN: 500.0000 mL | INTRAVENOUS | Status: DC

## 2015-06-04 NOTE — Patient Instructions (Addendum)
No polyps or cancer seen today! You do have a condition called diverticulosis - common and not usually a problem. Please read the handout provided.  Next routine colonoscopy/screening test in 10 years - 2026  I appreciate the opportunity to care for you. Iva Boop, MD, FACG  YOU HAD AN ENDOSCOPIC PROCEDURE TODAY AT THE [facility]:   Refer to the procedure report that was given to you for any specific questions about what was found during the examination.  If the procedure report does not answer your questions, please call your gastroenterologist to clarify.  If you requested that your care partner not be given the details of your procedure findings, then the procedure report has been included in a sealed envelope for you to review at your convenience later.  YOU SHOULD EXPECT: Some feelings of bloating in the abdomen. Passage of more gas than usual.  Walking can help get rid of the air that was put into your GI tract during the procedure and reduce the bloating. If you had a lower endoscopy (such as a colonoscopy or flexible sigmoidoscopy) you may notice spotting of blood in your stool or on the toilet paper. If you underwent a bowel prep for your procedure, you may not have a normal bowel movement for a few days.  Please Note:  You might notice some irritation and congestion in your nose or some drainage.  This is from the oxygen used during your procedure.  There is no need for concern and it should clear up in a day or so.  SYMPTOMS TO REPORT IMMEDIATELY:   Following lower endoscopy (colonoscopy or flexible sigmoidoscopy):  Excessive amounts of blood in the stool  Significant tenderness or worsening of abdominal pains  Swelling of the abdomen that is new, acute  Fever of 100F or higher  For urgent or emergent issues, a gastroenterologist can be reached at any hour by calling [phone].   DIET: Your first meal following the procedure should be a small  meal and then it is ok to progress to your normal diet. Heavy or fried foods are harder to digest and may make you feel nauseous or bloated.  Likewise, meals heavy in dairy and vegetables can increase bloating.  Drink plenty of fluids but you should avoid alcoholic beverages for 24 hours.  ACTIVITY:  You should plan to take it easy for the rest of today and you should NOT DRIVE or use heavy machinery until tomorrow (because of the sedation medicines used during the test).    FOLLOW UP: Our staff will call the number listed on your records the next business day following your procedure to check on you and address any questions or concerns that you may have regarding the information given to you following your procedure. If we do not reach you, we will leave a message.  However, if you are feeling well and you are not experiencing any problems, there is no need to return our call.  We will assume that you have returned to your regular daily activities without incident.  If any biopsies were taken you will be contacted by phone or by letter within the next 1-3 weeks.  Please call us at [phone] if you have not heard about the biopsies in 3 weeks.    SIGNATURES/CONFIDENTIALITY: You and/or your care partner have signed paperwork which will be entered into your electronic medical record.  These signatures attest to the fact that that the information above on your  After Visit Summary has been reviewed and is understood.  Full responsibility of the confidentiality of this discharge information lies with you and/or your care-partner.  Diverticulosis-handout given  Repeat colonoscopy in 10 years -2026.

## 2015-06-04 NOTE — Op Note (Signed)
[facility] [address] [place], [address]   COLONOSCOPY PROCEDURE REPORT  PATIENT: Wenzel, Gartrell  MR#: [id number] BIRTHDATE: 12/30/1955 , 59  yrs. old GENDER: male ENDOSCOPIST: Iva Boop, MD, [facility] PROCEDURE DATE:  06/04/2015 PROCEDURE:   Colonoscopy, surveillance First Screening Colonoscopy - Avg.  risk and is 50 yrs.  old or older - No.  Prior Negative Screening - Now for repeat screening. N/A  History of Adenoma - Now for follow-up colonoscopy & has been > or = to 3 yrs.  Yes hx of adenoma.  Has been 3 or more years since last colonoscopy.  Polyps removed today? No Recommend repeat exam, <10 yrs? No ASA CLASS:   Class II INDICATIONS:Surveillance due to prior colonic neoplasia and PH Colon Adenoma. MEDICATIONS: Propofol 175 mg IV and Monitored anesthesia care  DESCRIPTION OF PROCEDURE:   After the risks benefits and alternatives of the procedure were thoroughly explained, informed consent was obtained.  The digital rectal exam revealed no abnormalities of the rectum, revealed no prostatic nodules, and revealed the prostate was not enlarged.   The LB [id number] endoscope was introduced through the anus and advanced to the cecum, which was identified by both the appendix and ileocecal valve. No adverse events experienced.   The quality of the prep was excellent.  (MiraLax was used)  The instrument was then slowly withdrawn as the colon was fully examined. Estimated blood loss is zero unless otherwise noted in this procedure report.      COLON FINDINGS: There was mild diverticulosis noted in the sigmoid colon.   The examination was otherwise normal.   Right colon retroflexion included.  Retroflexed views revealed no abnormalities. The time to cecum = 1.5 Withdrawal time = 6.5   The scope was withdrawn and the procedure completed. COMPLICATIONS: There were no immediate complications.  ENDOSCOPIC IMPRESSION: 1.   Mild diverticulosis was noted  in the sigmoid colon 2.   The examination was otherwise normal 3.   Right colon retroflexion included  RECOMMENDATIONS: Repeat colonoscopy/screening test 10 years.  2026 - only had a small polyp in 2008 and none now so reverts to average risk.  eSigned:  Iva Boop, MD, [facility] 06/04/2015 11:46 AM   cc: The Patient and Dr. Oda Cogan

## 2015-06-04 NOTE — Progress Notes (Signed)
No problems noted in the recovery room. maw 

## 2015-06-04 NOTE — Progress Notes (Signed)
Transferred to recovery room. A/O x3, pleased with MAC.  VSS.  Report to April, RN. 

## 2015-06-05 ENCOUNTER — Telehealth: Payer: Self-pay | Admitting: *Deleted

## 2015-06-05 NOTE — Telephone Encounter (Signed)
  Follow up Call-  Call back number 06/04/2015  Post procedure Call Back phone  # [phone]  Permission to leave phone message Yes     Patient questions:  Do you have a fever, pain , or abdominal swelling? No. Pain Score  0 *  Have you tolerated food without any problems? Yes  Have you been able to return to your normal activities? Yes.    Do you have any questions about your discharge instructions: Diet   No. Medications  No. Follow up visit  No.  Do you have questions or concerns about your Care? No.  Actions: * If pain score is 4 or above: No action needed, pain <4.

## 2015-06-06 ENCOUNTER — Encounter: Payer: Self-pay | Admitting: *Deleted

## 2015-06-06 NOTE — Telephone Encounter (Signed)
Erroneous entry

## 2015-07-18 ENCOUNTER — Ambulatory Visit (HOSPITAL_COMMUNITY)
Admission: RE | Admit: 2015-07-18 | Discharge: 2015-07-18 | Disposition: A | Discharge status: Home / Self Care | Payer: 59 | Source: Ambulatory Visit | Attending: Family Medicine | Admitting: Family Medicine

## 2015-07-18 ENCOUNTER — Encounter: Payer: Self-pay | Admitting: Obstetrics and Gynecology

## 2015-07-18 ENCOUNTER — Other Ambulatory Visit: Payer: Self-pay | Admitting: Obstetrics and Gynecology

## 2015-07-18 ENCOUNTER — Ambulatory Visit (INDEPENDENT_AMBULATORY_CARE_PROVIDER_SITE_OTHER): Payer: 59 | Admitting: Obstetrics and Gynecology

## 2015-07-18 VITALS — BP 157/86 | HR 66 | Temp 97.9°F | Ht 69.0 in | Wt 162.0 lb

## 2015-07-18 DIAGNOSIS — M5136 Other intervertebral disc degeneration, lumbar region: Secondary | ICD-10-CM | POA: Diagnosis not present

## 2015-07-18 DIAGNOSIS — M5489 Other dorsalgia: Secondary | ICD-10-CM | POA: Diagnosis not present

## 2015-07-18 DIAGNOSIS — M549 Dorsalgia, unspecified: Secondary | ICD-10-CM | POA: Diagnosis present

## 2015-07-18 MED ORDER — ZOSTER VACCINE LIVE 19400 UNT/0.65ML ~~LOC~~ SOLR: 0.6500 mL | Freq: Once | SUBCUTANEOUS | Status: DC

## 2015-07-18 NOTE — Progress Notes (Signed)
   Subjective:   Patient ID: Wayne Molina, male    DOB: [birth year], 59 y.o.   MRN: [id number]  Patient presents for Same Day Appointment  Chief Complaint  Patient presents with  . Back Pain    HPI: # BACK PAIN  -Back pain in April/May -Shortly after last physical her remembers going to the gym an doing hack squats with moderate weight. He felt a sharp pain in lower part of back . -From then on nagging pain when getting up, bending over -points to pain location around T12 -Back pain doesn't radiate anywhere else -has history of arthritis  -Patient has tried ibuprofen; occasionally helps  History of trauma or injury: no Prior history of similar pain: once before when sneezed History of cancer: no History of IV drug use: no History of steroid use: no  Symptoms Incontinence of bowel or bladder:  no Numbness of leg: no Fever: no Rest or Night pain: doesn't wake up at night; but stiff in morning Weight Loss:  no Rash: no  Review of Systems   See HPI for ROS.   Past medical history, surgical, family, and social history reviewed and updated in the EMR as appropriate.  Objective:  BP 157/86 mmHg  Pulse 66  Temp(Src) 97.9 F (36.6 C) (Oral)  Ht 5\' 9"  (1.753 m)  Wt 162 lb (73.483 kg)  BMI 23.91 kg/m2 Vitals and nursing note reviewed  Physical Exam  Constitutional: He is well-developed, well-nourished, and in no distress.  Skin: Skin is warm and dry. No erythema.   Back Exam:  Inspection: Unremarkable; T12-L2 with more prominent vertebra ROM normal however he does havee discomfort with back extension and left side-bending SLR laying: Negative  XSLR laying: Positive on right with pain felt on left Palpable tenderness: None. Sensory change: Gross sensation intact to all lumbar and sacral dermatomes.  Strength in legs 5/5 Gait unremarkable.   Assessment & Plan:   1. Midline back pain, unspecified location: Patient with back pain that is now past the acute phase.  Pain is constant and not getting better. From reviewing chart patient has had back pain since 2015. No imaging has yet to be done. Believe this may just be DJD of spine. However with some positive physical exam findings want to rule out other pathology. No red flags on exam.   -DG thoracic and lumbar spine since pain is located between T11-L2 -Back exercises given -Continue OTC NSAIDs at this time for pain relief prn as this will help with arthritis pain -Handout given -Patient given return precautions   Caryl Ada, DO 07/18/2015, 10:03 AM PGY-2, [facility]

## 2015-07-18 NOTE — Patient Instructions (Signed)
Back Pain, Adult °Back pain is very common. The pain often gets better over time. The cause of back pain is usually not dangerous. Most people can learn to manage their back pain on their own.  °HOME CARE  °· Stay active. Start with short walks on flat ground if you can. Try to walk farther each day. °· Do not sit, drive, or stand in one place for more than 30 minutes. Do not stay in bed. °· Do not avoid exercise or work. Activity can help your back heal faster. °· Be careful when you bend or lift an object. Bend at your knees, keep the object close to you, and do not twist. °· Sleep on a firm mattress. Lie on your side, and bend your knees. If you lie on your back, put a pillow under your knees. °· Only take medicines as told by your doctor. °· Put ice on the injured area. °¨ Put ice in a plastic bag. °¨ Place a towel between your skin and the bag. °¨ Leave the ice on for 15-20 minutes, 03-04 times a day for the first 2 to 3 days. After that, you can switch between ice and heat packs. °· Ask your doctor about back exercises or massage. °· Avoid feeling anxious or stressed. Find good ways to deal with stress, such as exercise. °GET HELP RIGHT AWAY IF:  °· Your pain does not go away with rest or medicine. °· Your pain does not go away in 1 week. °· You have new problems. °· You do not feel well. °· The pain spreads into your legs. °· You cannot control when you poop (bowel movement) or pee (urinate). °· Your arms or legs feel weak or lose feeling (numbness). °· You feel sick to your stomach (nauseous) or throw up (vomit). °· You have belly (abdominal) pain. °· You feel like you may pass out (faint). °MAKE SURE YOU:  °· Understand these instructions. °· Will watch your condition. °· Will get help right away if you are not doing well or get worse. °Document Released: 04/13/2008 Document Revised: 01/18/2012 Document Reviewed: 02/27/2014 °ExitCare® Patient Information ©2015 [employer]. This information is not intended  to replace advice given to you by your health care provider. Make sure you discuss any questions you have with your health care provider. ° °

## 2015-07-19 ENCOUNTER — Telehealth: Payer: Self-pay | Admitting: Family Medicine

## 2015-07-19 NOTE — Telephone Encounter (Signed)
Called spoke with him about fracture  Has been having symptoms for months with not recent changes  No focal weakness  Related to him I will investigate if anything needs to be done for this fracture and also will likely need a bone density and I will call him next week

## 2015-07-22 ENCOUNTER — Telehealth: Payer: Self-pay | Admitting: Family Medicine

## 2015-07-22 DIAGNOSIS — Z13220 Encounter for screening for lipoid disorders: Secondary | ICD-10-CM

## 2015-07-22 DIAGNOSIS — E785 Hyperlipidemia, unspecified: Secondary | ICD-10-CM | POA: Insufficient documentation

## 2015-07-22 DIAGNOSIS — Z13820 Encounter for screening for osteoporosis: Secondary | ICD-10-CM

## 2015-07-22 DIAGNOSIS — S32009A Unspecified fracture of unspecified lumbar vertebra, initial encounter for closed fracture: Secondary | ICD-10-CM

## 2015-07-22 NOTE — Telephone Encounter (Signed)
Put in referral to ortho and for bone density test  Left message on his machine to call

## 2015-07-22 NOTE — Assessment & Plan Note (Signed)
From history seems to be months old but still causing symptoms.  Will check a bone density and refer to ortho

## 2015-07-22 NOTE — Telephone Encounter (Signed)
Pt calling and states that he is in need of a bone density test. Would like to know if we are going to set that up for him or if he needs to pursue the option himself, if so, he would like to know where he needs to call. Please advise. Thank you, Dorothey Baseman, ASA

## 2015-07-22 NOTE — Telephone Encounter (Signed)
Will forward to PCP for review. Shrita Thien, CMA. 

## 2015-08-26 ENCOUNTER — Telehealth: Payer: Self-pay | Admitting: Family Medicine

## 2015-08-26 NOTE — Telephone Encounter (Signed)
Pls check to see if he has seen Dr Elvin So before.  If not we would need to see him here first and he we could likely treat it.  Thanks  LC

## 2015-08-26 NOTE — Telephone Encounter (Signed)
Will forward to MD. Jazmin Hartsell,CMA  

## 2015-08-26 NOTE — Telephone Encounter (Signed)
Pt called and would like a referral to go to Dr. Elvin So at [facility] for an ingrown toenail that is infected. jw

## 2015-08-26 NOTE — Telephone Encounter (Signed)
LM for patient to call back. Wayne Molina,CMA  

## 2015-08-27 NOTE — Telephone Encounter (Signed)
FYI: Pt returned Wayne Molina's call. Pt stated that he has not seen Dr. Elvin So before and I set him up an appointment tomorrow morning with Wayne Molina as the pt states that the pain was getting worse. Sadie Reynolds, ASA

## 2015-08-27 NOTE — Telephone Encounter (Signed)
Thank you :)

## 2015-08-27 NOTE — Telephone Encounter (Signed)
Will forward to MD to make them both aware. Florice Hindle,CMA

## 2015-08-28 ENCOUNTER — Encounter: Payer: Self-pay | Admitting: Family Medicine

## 2015-08-28 ENCOUNTER — Ambulatory Visit (INDEPENDENT_AMBULATORY_CARE_PROVIDER_SITE_OTHER): Payer: 59 | Admitting: Family Medicine

## 2015-08-28 VITALS — BP 138/78 | HR 78 | Temp 98.4°F | Wt 161.5 lb

## 2015-08-28 DIAGNOSIS — B351 Tinea unguium: Secondary | ICD-10-CM | POA: Diagnosis not present

## 2015-08-28 DIAGNOSIS — L600 Ingrowing nail: Secondary | ICD-10-CM

## 2015-08-28 DIAGNOSIS — I7300 Raynaud's syndrome without gangrene: Secondary | ICD-10-CM | POA: Insufficient documentation

## 2015-08-28 NOTE — Patient Instructions (Addendum)
Your nail does not look infected.  You may schedule your toenail to be removed if you change your mind.  In the meantime, continue soaking in epsom salt and warm water 2-3 times daily.  If the toe becomes hot, swollen, more painful or has purulence coming from it, come back for evaluation.  Your toes look like you have something called Raynaud's.  If it is not bothering you, there is nothing to do about it at this time.  Avoid cold temperatures, this will help the discoloration.  Raynaud Phenomenon Raynaud phenomenon is a condition that affects the blood vessels (arteries) that carry blood to your fingers and toes. The arteries that supply blood to your ears or the tip of your nose might also be affected. Raynaud phenomenon causes the arteries to temporarily narrow. As a result, the flow of blood to the affected areas is temporarily decreased. This usually occurs in response to cold temperatures or stress. During an attack, the skin in the affected areas turns white. You may also feel tingling or numbness in those areas. Attacks usually last for only a brief period, and then the blood flow to the area returns to normal. In most cases, Raynaud phenomenon does not cause serious health problems. CAUSES  For many people with this condition, the cause is not known. Raynaud phenomenon is sometimes associated with other diseases, such as scleroderma or lupus. RISK FACTORS Raynaud phenomenon can affect anyone, but it develops most often in people who are 52-71 years old. It affects more females than males. SIGNS AND SYMPTOMS Symptoms of Raynaud phenomenon may occur when you are exposed to cold temperatures or when you have emotional stress. The symptoms may last for a few minutes or up to several hours. They usually affect your fingers but may also affect your toes, ears, or the tip of your nose. Symptoms may include:  Changes in skin color. The skin in the affected areas will turn pale or white. The skin may  then change from white to bluish to red as normal blood flow returns to the area.  Numbness, tingling, or pain in the affected areas. In severe cases, sores may develop in the affected areas.  DIAGNOSIS  Your health care provider will do a physical exam and take your medical history. You may be asked to put your hands in cold water to check for a reaction to cold temperature. Blood tests may be done to check for other diseases or conditions. Your health care provider may also order a test to check the movement of blood through your arteries and veins (vascular ultrasound). TREATMENT  Treatment often involves making lifestyle changes and taking steps to control your exposure to cold temperatures. For more severe cases, medicine (calcium channel blockers) may be used to improve blood flow. Surgery is sometimes done to block the nerves that control the affected arteries, but this is rare. HOME CARE INSTRUCTIONS   Avoid exposure to cold by taking these steps:  If possible, stay indoors during cold weather.  When you go outside during cold weather, dress in layers and wear mittens, a hat, a scarf, and warm footwear.  Wear mittens or gloves when handling ice or frozen food.  Use holders for glasses or cans containing cold drinks.  Let warm water run for a while before taking a shower or bath.  Warm up the car before driving in cold weather.  If possible, avoid stressful and emotional situations. Exercise, meditation, and yoga may help you cope with stress.  Biofeedback may be useful.  Do not use any tobacco products, including cigarettes, chewing tobacco, or electronic cigarettes. If you need help quitting, ask your health care provider.  Avoid secondhand smoke.  Limit your use of caffeine. Switch to decaffeinated coffee, tea, and soda. Avoid chocolate.  Wear loose fitting socks and comfortable, roomy shoes.  Avoid vibrating tools and machinery.  Take medicines only as directed by your  health care provider. SEEK MEDICAL CARE IF:   Your discomfort becomes worse despite lifestyle changes.  You develop sores on your fingers or toes that do not heal.  Your fingers or toes turn black.  You have breaks in the skin on your fingers or toes.  You have a fever.  You have pain or swelling in your joints.  You have a rash.  Your symptoms occur on only one side of your body.   This information is not intended to replace advice given to you by your health care provider. Make sure you discuss any questions you have with your health care provider.   Document Released: 10/23/2000 Document Revised: 11/16/2014 Document Reviewed: 05/31/2014 Elsevier Interactive Patient Education [employer]

## 2015-08-28 NOTE — Progress Notes (Signed)
    Subjective: CC: ingrown toenail HPI: Patient is a 59 y.o. male presenting to clinic today for same day appt. Concerns today include:  Ingrown toenail R foot, lateral aspect/ nail fungus Patient notes that he has had a toenail fungus for several years.  He has tried Lamisil tablets and topical cream.  This did not resolve fungus.  He notes that swelling and pain started on Friday.  He used peroxide with little relief.  Started epsom salt with warm water soaks on Sunday twice daily.  Has used Motrin with little relief.  Patient notes he tried to remove part of the nail himself on Saturday.  He had bleeding after that.  Denies purulence, fevers, chills, nausea or vomiting.  Pain was a 12/10 now is a 1/10.    Skin discoloration of toes Patient notes that he has had intermittent discoloration of his skin on his toes.  He notes that sometimes they look blue and dusky.  Sometimes they look red.  He denies numbness, tingling, pain.  He denies trauma.  Social History Reviewed: non smoker. FamHx and MedHx updated.  Please see EMR. Health Maintenance: UTD  ROS: Per HPI  Objective: Office vital signs reviewed. BP 138/78 mmHg  Pulse 78  Temp(Src) 98.4 F (36.9 C) (Oral)  Wt 161 lb 8 oz (73.256 kg)  Physical Examination:  General: Awake, alert, well nourished, well appearing male, NAD HEENT: Normal, MMM Extremities: WWP, No edema, cyanosis or clubbing; +2 PT pulses bilaterally  L foot: all nails with onychomycotic changes, L great toe nail with upper half of nail missing, nail bed with healed lesion, no increased warmth, erythema, edema, bleeding or purulence.  Wound appears well healed.  Toe is NOT tender to palpation.  FROM of toes. Skin of all digits with bluish discoloration, + sensation in tact.  R foot: all nails with onychomycotic changes (R great toe with marked nail deformity compared to rest of digits, does not appear ingrown), FROM, Skin of all digits with bluish discoloration, +  sensation in tact. MSK: Normal gait and station Skin: as above  Assessment/ Plan: 59 y.o. male with  1. Ingrown right big toenail.  No evidence of infection.  Appears well healed after patient partially removed his own nail.  Patient declines removal today, as pain is markedly improved with Epsom salt soaks.  - Continue soaks for next few days.  Peel back skin at ingrown nail site. - No activity restrictions - Return precautions reviewed  2. Raynaud's phenomenon.  Does not appear to be impacting patient much.   - Avoid cold temps when able - Monitor for now - Consider CCB if becomes an issue in the future.  3. Onychomycosis, involving almost all of the nails on both feet.  Apparently has failed multiple treatments - Continue to keep feet dry and clean - Keep nails trimmed as able - follow up with PCP as needed for routine care  Raliegh Ip, DO PGY-2, [facility]

## 2015-09-18 ENCOUNTER — Ambulatory Visit (INDEPENDENT_AMBULATORY_CARE_PROVIDER_SITE_OTHER): Payer: 59 | Admitting: Cardiovascular Disease

## 2015-09-18 ENCOUNTER — Encounter: Payer: Self-pay | Admitting: Cardiovascular Disease

## 2015-09-18 VITALS — BP 100/60 | HR 62 | Resp 16 | Ht 69.0 in | Wt 163.5 lb

## 2015-09-18 DIAGNOSIS — I429 Cardiomyopathy, unspecified: Secondary | ICD-10-CM | POA: Diagnosis not present

## 2015-09-18 DIAGNOSIS — I482 Chronic atrial fibrillation, unspecified: Secondary | ICD-10-CM

## 2015-09-18 DIAGNOSIS — M79606 Pain in leg, unspecified: Secondary | ICD-10-CM

## 2015-09-18 DIAGNOSIS — I10 Essential (primary) hypertension: Secondary | ICD-10-CM

## 2015-09-18 NOTE — Patient Instructions (Signed)
Medication Instructions:   NO CHANGE  Labwork:  Your physician recommends that you HAVE LAB WORK TODAY  Testing/Procedures:  Your physician has requested that you have a lower extremity arterial duplex. During this test, ultrasound are used to evaluate arterial blood flow in the legs. Allow one hour for this exam. There are no restrictions or special instructions.   Follow-Up:  Your physician wants you to follow-up in: ONE YEAR WITH DR Royann Shivers You will receive a reminder letter in the mail two months in advance. If you don't receive a letter, please call our office to schedule the follow-up appointment.   If you need a refill on your cardiac medications before your next appointment, please call your pharmacy.

## 2015-09-19 LAB — COMPREHENSIVE METABOLIC PANEL
ALBUMIN: 4.5 g/dL (ref 3.6–5.1)
ALK PHOS: 60 U/L (ref 40–115)
ALT: 26 U/L (ref 9–46)
AST: 30 U/L (ref 10–35)
BILIRUBIN TOTAL: 1.9 mg/dL — AB (ref 0.2–1.2)
BUN: 23 mg/dL (ref 7–25)
CO2: 24 mmol/L (ref 20–31)
CREATININE: 0.97 mg/dL (ref 0.70–1.25)
Calcium: 9.8 mg/dL (ref 8.6–10.3)
Chloride: 103 mmol/L (ref 98–110)
Glucose, Bld: 87 mg/dL (ref 65–99)
Potassium: 4.6 mmol/L (ref 3.5–5.3)
SODIUM: 137 mmol/L (ref 135–146)
TOTAL PROTEIN: 7.6 g/dL (ref 6.1–8.1)

## 2015-09-19 LAB — CK: CK TOTAL: 152 U/L (ref 7–232)

## 2015-09-19 LAB — MAGNESIUM: MAGNESIUM: 2.4 mg/dL (ref 1.5–2.5)

## 2015-09-20 ENCOUNTER — Encounter: Payer: Self-pay | Admitting: Cardiovascular Disease

## 2015-09-20 NOTE — Progress Notes (Signed)
Patient ID: Wayne Molina, male   DOB: 04-Jul-1956, 59 y.o.   MRN: [id number]      Cardiology Office Note   Date:  09/20/2015   ID:  JARICK HARKINS, DOB 06/06/1956, MRN [id number]  PCP:  Carney Living, MD  Cardiologist:   Thurmon Fair, MD   Chief Complaint  Patient presents with  . Leg Pain    patient reports severe leg cramps - breaks out in sweats, nausea, and can barely move      History of Present Illness: Wayne Molina is a 59 y.o. male who presents for  Presents for follow-up for permanent atrial fibrillation with spontaneously controlled ventricular response then abnormal ECG suggestive of hypertrophic cardiomyopathy but without evidence of any structural abnormalities of the left ventricle by conventional imaging methods. He has biatrial dilatation by echocardiography. He has normal left ventricular systolic function.  He has mild to moderate mitral and tricuspid insufficiency, unlikely to have a causal relationship with his atrial fibrillation. He has previously been diagnosed with sleep apnea but has been unable to tolerate CPAP. He does not have any symptoms of hypersomnolence. He has not had any focal neurological events and has never had embolic complications from his chronic atrial fibrillation.  He has normal left ventricular systolic function and had a normal nuclear stress test in the past.   As before, Wayne Molina has no cardiovascular complaints. He has been exercising regularly, although recently this has been limited following a back injury. He sneezed and developed sudden severe back pain and later discovered that he had a small lumbar spine fracture. He has also had a lot of problems with severe muscle cramps in his lower extremities at rest. He does not take diuretics and is actually currently on a potassium supplement and magnesium supplement in an effort to alleviate the cramps. He thinks that he is drinking enough water.   he has mild systemic hypertension. On his  current dose of losartan his blood pressure is well controlled   Past Medical History  Diagnosis Date  . Anxiety   . Arthritis     fingers  . Depression   . Hyperlipidemia   . Hypertension   . Sleep apnea   . Personal history of colonic polyps 11/12/2006  . Family history of malignant neoplasm of gastrointestinal tract   . Chronic atrial fibrillation [facility])     Past Surgical History  Procedure Laterality Date  . Hip fracture surgery  1970s  . Atrial ablation surgery  2004  . 2-d echocardiogram  07/22/2010    Ejection fraction greater than 55%. Left atrium moderately dilated. Right atrium moderately dilated. 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 Mild to Moderate MR. Mild to moderate TR.  Marland Kitchen Exercise myoview stress test  07/08/2000    Nonischemic low-risk.     Current Outpatient Prescriptions  Medication Sig Dispense Refill  . aspirin 81 MG tablet Take 81 mg by mouth daily.      . Glucosamine-Chondroit-Vit C-Mn (GLUCOSAMINE 1500 COMPLEX PO) Take 1,500 mg by mouth.    Marland Kitchen ibuprofen (ADVIL,MOTRIN) 800 MG tablet Take 1 tablet (800 mg total) by mouth every 8 (eight) hours as needed. Take with food. 30 tablet 3  . losartan (COZAAR) 50 MG tablet Take 100 mg by mouth daily.    Marland Kitchen MAGNESIUM OXIDE PO Take 1 tablet by mouth daily. 250 mg    . MULTIPLE VITAMIN PO Take 1 tablet by mouth daily.    . Potassium (POTASSIMIN PO) Take 1 tablet by mouth daily.  595 mg    . Probiotic Product (PROBIOTIC DAILY PO) Take 46 mg by mouth. [employer] probiotic 10     No current facility-administered medications for this visit.    Allergies:   Review of patient's allergies indicates no known allergies.    Social History:  The patient  reports that he has never smoked. He has never used smokeless tobacco. He reports that he does not drink alcohol or use illicit drugs.   Family History:  The patient's family history includes Colon cancer in his maternal uncle and other; Heart disease in his father; Stomach cancer in an  other family member.    ROS:  Please see the history of present illness.    Otherwise, review of systems positive for none.   All other systems are reviewed and negative.    PHYSICAL EXAM: VS:  BP 100/60 mmHg  Pulse 62  Ht  (1.753 m)  Wt 163 lb 8 oz (74.163 kg)  BMI 24.13 kg/m2 , BMI Body mass index is 24.13 kg/(m^2).  General: Alert, oriented x3, no distress Head: no evidence of trauma, PERRL, EOMI, no exophtalmos or lid lag, no myxedema, no xanthelasma; normal ears, nose and oropharynx Neck: normal jugular venous pulsations and no hepatojugular reflux; brisk carotid pulses without delay and no carotid bruits Chest: clear to auscultation, no signs of consolidation by percussion or palpation, normal fremitus, symmetrical and full respiratory excursions Cardiovascular: normal position and quality of the apical impulse, irregular rhythm, normal first and second heart sounds, no murmurs, rubs or gallops Abdomen: no tenderness or distention, no masses by palpation, no abnormal pulsatility or arterial bruits, normal bowel sounds, no hepatosplenomegaly Extremities: no clubbing, cyanosis or edema; 2+ radial, ulnar and brachial pulses bilaterally; 2+ right femoral, posterior tibial and dorsalis pedis pulses; 2+ left femoral, posterior tibial and dorsalis pedis pulses; no subclavian or femoral bruits Neurological: grossly nonfocal Psych: euthymic mood, full affect   EKG:  EKG is ordered today. The ekg ordered today demonstrates  Atrial fibrillation, left ventricular hypertrophy with prominent anterior T-wave inversion, QTC 452 ms   Recent Labs: 09/18/2015: ALT 26; BUN 23; Creat 0.97; Magnesium 2.4; Potassium 4.6; Sodium 137    Lipid Panel    Component Value Date/Time   CHOL 195 12/06/2013 1112   TRIG 70 12/06/2013 1112   HDL 47 12/06/2013 1112   CHOLHDL 4.1 12/06/2013 1112   VLDL 14 12/06/2013 1112   LDLCALC 134* 12/06/2013 1112      Wt Readings from Last 3 Encounters:    09/18/15 163 lb 8 oz (74.163 kg)  08/28/15 161 lb 8 oz (73.256 kg)  07/18/15 162 lb (73.483 kg)    .   ASSESSMENT AND PLAN:  1.  Permanent atrial fibrillation, spontaneously rate controlled and asymptomatic. Embolic risk is intermediate a stroke risk factor is hypertension. CHADSVasc score 1.  As he gets older and approaches the age of 22 with may have to discuss switching from aspirin to oral anticoagulants. He should call promptly for medical assistance for any focal neurological complaints.  2.  Abnormal ECG, suggestive of apical variant hypertrophic cardiomyopathy, without much in the way of structural abnormalities by echocardiography  3.  Severe muscle cramps. I'm not sure why these are occurring. He does not take a proton pump inhibitor or diuretic. He is not on a statin. Check labs today and his magnesium and potassium levels are normal. I have recommended that he drinks substantially more water during the day.    Current medicines  are reviewed at length with the patient today.  The patient does not have concerns regarding medicines.  The following changes have been made:  no change  Labs/ tests ordered today include:  Orders Placed This Encounter  Procedures  . Comprehensive Metabolic Panel (CMET)  . Magnesium  . CK (Creatine Kinase)  . EKG 12-Lead    Patient Instructions  Medication Instructions:   NO CHANGE  Labwork:  Your physician recommends that you HAVE LAB WORK TODAY  Testing/Procedures:  Your physician has requested that you have a lower extremity arterial duplex. During this test, ultrasound are used to evaluate arterial blood flow in the legs. Allow one hour for this exam. There are no restrictions or special instructions.   Follow-Up:  Your physician wants you to follow-up in: ONE YEAR WITH DR Royann Shivers You will receive a reminder letter in the mail two months in advance. If you don't receive a letter, please call our office to schedule the follow-up  appointment.   If you need a refill on your cardiac medications before your next appointment, please call your pharmacy.        Joie Bimler, MD  09/20/2015 10:11 PM    Thurmon Fair, MD, [facility] [phone] office [phone] pager

## 2015-09-26 ENCOUNTER — Ambulatory Visit (HOSPITAL_COMMUNITY)
Admission: RE | Admit: 2015-09-26 | Discharge: 2015-09-26 | Disposition: A | Discharge status: Home / Self Care | Payer: 59 | Source: Ambulatory Visit | Attending: Cardiology | Admitting: Cardiology

## 2015-09-26 DIAGNOSIS — M79605 Pain in left leg: Secondary | ICD-10-CM | POA: Diagnosis not present

## 2015-09-26 DIAGNOSIS — M79604 Pain in right leg: Secondary | ICD-10-CM | POA: Diagnosis not present

## 2015-09-26 DIAGNOSIS — I10 Essential (primary) hypertension: Secondary | ICD-10-CM | POA: Insufficient documentation

## 2015-09-26 DIAGNOSIS — M79606 Pain in leg, unspecified: Secondary | ICD-10-CM | POA: Diagnosis not present

## 2015-09-26 DIAGNOSIS — E785 Hyperlipidemia, unspecified: Secondary | ICD-10-CM | POA: Insufficient documentation

## 2015-11-22 ENCOUNTER — Other Ambulatory Visit: Payer: Self-pay | Admitting: *Deleted

## 2015-11-22 MED ORDER — LOSARTAN POTASSIUM 50 MG PO TABS: 100.0000 mg | ORAL_TABLET | Freq: Every day | ORAL | Status: DC

## 2015-11-22 MED FILL — LOSARTAN POTASSIUM 50 MG TA: 50 | 90 days supply | Qty: 180 | Fill #0

## 2015-12-25 DIAGNOSIS — R7989 Other specified abnormal findings of blood chemistry: Secondary | ICD-10-CM | POA: Diagnosis not present

## 2015-12-25 DIAGNOSIS — Z7982 Long term (current) use of aspirin: Secondary | ICD-10-CM | POA: Diagnosis not present

## 2015-12-25 DIAGNOSIS — I482 Chronic atrial fibrillation: Secondary | ICD-10-CM | POA: Diagnosis not present

## 2015-12-25 DIAGNOSIS — Z79899 Other long term (current) drug therapy: Secondary | ICD-10-CM | POA: Diagnosis not present

## 2015-12-25 DIAGNOSIS — E785 Hyperlipidemia, unspecified: Secondary | ICD-10-CM | POA: Diagnosis not present

## 2015-12-25 DIAGNOSIS — R404 Transient alteration of awareness: Secondary | ICD-10-CM | POA: Diagnosis not present

## 2015-12-25 DIAGNOSIS — R748 Abnormal levels of other serum enzymes: Secondary | ICD-10-CM | POA: Diagnosis not present

## 2015-12-25 DIAGNOSIS — I10 Essential (primary) hypertension: Secondary | ICD-10-CM | POA: Diagnosis not present

## 2015-12-25 DIAGNOSIS — R55 Syncope and collapse: Secondary | ICD-10-CM | POA: Diagnosis not present

## 2015-12-25 DIAGNOSIS — R079 Chest pain, unspecified: Secondary | ICD-10-CM | POA: Diagnosis not present

## 2015-12-25 DIAGNOSIS — M1990 Unspecified osteoarthritis, unspecified site: Secondary | ICD-10-CM | POA: Diagnosis not present

## 2015-12-25 DIAGNOSIS — G4733 Obstructive sleep apnea (adult) (pediatric): Secondary | ICD-10-CM | POA: Diagnosis not present

## 2015-12-26 DIAGNOSIS — I10 Essential (primary) hypertension: Secondary | ICD-10-CM | POA: Diagnosis not present

## 2015-12-26 DIAGNOSIS — Z7982 Long term (current) use of aspirin: Secondary | ICD-10-CM | POA: Diagnosis not present

## 2015-12-26 DIAGNOSIS — M1990 Unspecified osteoarthritis, unspecified site: Secondary | ICD-10-CM | POA: Diagnosis not present

## 2015-12-26 DIAGNOSIS — I482 Chronic atrial fibrillation: Secondary | ICD-10-CM | POA: Diagnosis not present

## 2015-12-26 DIAGNOSIS — I517 Cardiomegaly: Secondary | ICD-10-CM | POA: Diagnosis not present

## 2015-12-26 DIAGNOSIS — G4733 Obstructive sleep apnea (adult) (pediatric): Secondary | ICD-10-CM | POA: Diagnosis not present

## 2015-12-26 DIAGNOSIS — Z79899 Other long term (current) drug therapy: Secondary | ICD-10-CM | POA: Diagnosis not present

## 2015-12-26 DIAGNOSIS — R7989 Other specified abnormal findings of blood chemistry: Secondary | ICD-10-CM | POA: Diagnosis not present

## 2015-12-26 DIAGNOSIS — R55 Syncope and collapse: Secondary | ICD-10-CM | POA: Diagnosis not present

## 2015-12-26 DIAGNOSIS — I083 Combined rheumatic disorders of mitral, aortic and tricuspid valves: Secondary | ICD-10-CM | POA: Diagnosis not present

## 2015-12-26 DIAGNOSIS — R748 Abnormal levels of other serum enzymes: Secondary | ICD-10-CM | POA: Diagnosis not present

## 2015-12-26 DIAGNOSIS — E785 Hyperlipidemia, unspecified: Secondary | ICD-10-CM | POA: Diagnosis not present

## 2016-01-01 ENCOUNTER — Encounter: Payer: Self-pay | Admitting: Family Medicine

## 2016-01-01 ENCOUNTER — Ambulatory Visit (INDEPENDENT_AMBULATORY_CARE_PROVIDER_SITE_OTHER): Payer: 59 | Admitting: Family Medicine

## 2016-01-01 VITALS — BP 143/67 | HR 74 | Temp 97.8°F | Ht 69.0 in | Wt 165.0 lb

## 2016-01-01 DIAGNOSIS — R55 Syncope and collapse: Secondary | ICD-10-CM | POA: Diagnosis not present

## 2016-01-01 NOTE — Assessment & Plan Note (Signed)
First episode after sex likely vasovagal but given his history of afib and cardiomyopathy (recent echo normal EF) may have had rapid afib.  Advised him to follow up with his cardiologist for perhaps prolonged HR monitoring and or stress testing.

## 2016-01-01 NOTE — Patient Instructions (Signed)
Good to see you today!  Thanks for coming in.  I would follow up with Dr C in the next 1-2 months  I would monitor your blood pressure and write down - check 2-3 x a week and write down the top and bottom number and your pulse rate - if you monitor will read it  Keep taking the medications as you are  If you have any lightheadedness or near passing the call

## 2016-01-01 NOTE — Progress Notes (Signed)
   Subjective:    Patient ID: Wayne Molina, male    DOB: October 23, 1956, 60 y.o.   MRN: [id number]  HPI  Syncope Patient here for follow up after syncopal episode after sex.  He felt a little sick after intercouse lying in bed then stood up and passed out.  No post syncope confusion, incontinence and no tongue biting. Was evaluated at [facility] with overnight telemetry head ct and echo. (see care eveywhere)  He has not had any episodes of syncope or lightheadness since discharge.  No chest pain or shortness of breath with exercise.  He does have some mild residual hip pain from his fall.  No change in medications.  He does not check his blood pressure regularly nor can he feel his afib irregular pulse.  Chief Complaint noted Review of Symptoms - see HPI PMH - Smoking status noted.   Vital Signs reviewed    Review of Systems     Objective:   Physical Exam  Alert nad Can walk on tip toes heels and normal balance Heart - Irreg rate about 70.  No murmurs appreciated Lungs:  Normal respiratory effort, chest expands symmetrically. Lungs are clear to auscultation, no crackles or wheezes. Extremities:  No cyanosis, edema, or deformity noted with good range of motion of all major joints.            Assessment & Plan:

## 2016-01-13 ENCOUNTER — Ambulatory Visit: Payer: 59 | Admitting: Cardiovascular Disease

## 2016-01-29 ENCOUNTER — Encounter: Payer: 59 | Admitting: Family Medicine

## 2016-01-29 ENCOUNTER — Encounter: Payer: Self-pay | Admitting: Cardiovascular Disease

## 2016-01-29 ENCOUNTER — Ambulatory Visit (INDEPENDENT_AMBULATORY_CARE_PROVIDER_SITE_OTHER): Payer: 59 | Admitting: Cardiovascular Disease

## 2016-01-29 VITALS — BP 120/72 | HR 66 | Ht 69.0 in | Wt 165.2 lb

## 2016-01-29 DIAGNOSIS — R55 Syncope and collapse: Secondary | ICD-10-CM | POA: Diagnosis not present

## 2016-01-29 DIAGNOSIS — N522 Drug-induced erectile dysfunction: Secondary | ICD-10-CM

## 2016-01-29 DIAGNOSIS — G4730 Sleep apnea, unspecified: Secondary | ICD-10-CM | POA: Diagnosis not present

## 2016-01-29 DIAGNOSIS — I429 Cardiomyopathy, unspecified: Secondary | ICD-10-CM

## 2016-01-29 DIAGNOSIS — I482 Chronic atrial fibrillation, unspecified: Secondary | ICD-10-CM

## 2016-01-29 DIAGNOSIS — I10 Essential (primary) hypertension: Secondary | ICD-10-CM

## 2016-01-29 DIAGNOSIS — N529 Male erectile dysfunction, unspecified: Secondary | ICD-10-CM | POA: Insufficient documentation

## 2016-01-29 MED ORDER — SILDENAFIL CITRATE 50 MG PO TABS: 50.0000 mg | ORAL_TABLET | Freq: Every day | ORAL | Status: DC | PRN

## 2016-01-29 MED FILL — VIAGRA 50 MG TABLET: 50 | 30 days supply | Qty: 6 | Fill #0

## 2016-01-29 NOTE — Patient Instructions (Signed)
Dr Royann Shivers recommends that have a loop recorder placed. This will be done at [facility]. You don't have to have any blood work or be fasting for this procedure.  Your physician has recommended you make the following change in your medication: 1. TAKE Viagra 50 mg as needed  >>A new prescription has been sent to your pharmacy electronically  Dr Royann Shivers recommends that you schedule a follow-up appointment in 6 months. You will receive a reminder letter in the mail two months in advance. If you don't receive a letter, please call our office to schedule the follow-up appointment.  If you need a refill on your cardiac medications before your next appointment, please call your pharmacy.

## 2016-01-29 NOTE — Progress Notes (Signed)
Patient ID: Wayne Molina, male   DOB: 21-Jan-1956, 60 y.o.   MRN: [id number]    Cardiology Office Note    Date:  01/29/2016   ID:  Wayne Molina, DOB 08-06-1956, MRN [id number]  PCP:  Carney Living, MD  Cardiologist:   Thurmon Fair, MD   Chief Complaint  Patient presents with  . Loss of Consciousness    patient reports 1 episode (on the 15th or 16th) he passed out, states he was feeling bad and was heading to the er prior to passing out. his symptoms haven't reoccured. patient does report weakness - his bp (which is normally 140/90) has been low for him.    History of Present Illness:  Wayne Molina is a 60 y.o. male the recent episode of syncope, that occurred roughly one month ago. This is the first time he has ever lost consciousness. During sexual intercourse he became dyspneic. Following intercourse he felt weak and nauseous. His girlfriend encouraged him to go to the emergency room but he just wanted to go home. As he was heading out the door he lost consciousness and fell. He had a small head injury with a tiny laceration and a small hematoma with no serious injury. There was no evidence of convulsions or loss of sphincter control. Loss of consciousness was very brief.   Reportedly his blood pressure was normal by the time it was checked. Overnight telemetry did not show meaningful arrhythmia, other than his permanent atrial fibrillation. His ECG, as before shows marked anterolateral ST-T wave abnormalities, similar to previous tracings. The QT see interval was 429 ms. Echocardiogram showed normal left ventricular systolic function and biatrial dilatation with mild-moderate mitral and tricuspid insufficiency, similar to previous studies. The study describes mild concentric LVH. It also reports normal diastolic function and very mild elevation in pulmonary artery pressure at 30-40 mmHg. Head CT and chest x-ray were normal. Labs were normal with a creatinine of 0.9, borderline but stable  troponin and normal CK-MB.  Independent of the above events, Wayne Molina has noticed erectile dysfunction. He has no difficulty achieving an erection, but he loses it quickly during intercourse.  Wayne Molina has permanent atrial fibrillation with spontaneously controlled ventricular response then abnormal ECG suggestive of hypertrophic cardiomyopathy but without evidence of any structural abnormalities of the left ventricle by conventional imaging methods. He has biatrial dilatation by echocardiography. He has normal left ventricular systolic function. He has mild to moderate mitral and tricuspid insufficiency, unlikely to have a causal relationship with his atrial fibrillation. He has previously been diagnosed with sleep apnea but has been unable to tolerate CPAP. He does not have any symptoms of hypersomnolence. He has not had any focal neurological events and has never had embolic complications from his chronic atrial fibrillation. He has normal left ventricular systolic function and had a normal nuclear stress test in the past.  Past Medical History  Diagnosis Date  . Anxiety   . Arthritis     fingers  . Depression   . Hyperlipidemia   . Hypertension   . Sleep apnea   . Personal history of colonic polyps 11/12/2006  . Family history of malignant neoplasm of gastrointestinal tract   . Chronic atrial fibrillation [facility])     Past Surgical History  Procedure Laterality Date  . Hip fracture surgery  1970s  . Atrial ablation surgery  2004  . 2-d echocardiogram  07/22/2010    Ejection fraction greater than 55%. Left atrium moderately dilated. Right atrium moderately dilated.  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 Mild to Moderate MR. Mild to moderate TR.  Marland Kitchen Exercise myoview stress test  07/08/2000    Nonischemic low-risk.    Outpatient Prescriptions Prior to Visit  Medication Sig Dispense Refill  . aspirin 81 MG tablet Take 81 mg by mouth daily.      . Glucosamine-Chondroit-Vit C-Mn (GLUCOSAMINE 1500  COMPLEX PO) Take 1,500 mg by mouth as needed.     Marland Kitchen ibuprofen (ADVIL,MOTRIN) 800 MG tablet Take 1 tablet (800 mg total) by mouth every 8 (eight) hours as needed. Take with food. 30 tablet 3  . losartan (COZAAR) 50 MG tablet Take 2 tablets (100 mg total) by mouth daily. 180 tablet 3  . MAGNESIUM OXIDE PO Take 1 tablet by mouth daily. 250 mg    . MULTIPLE VITAMIN PO Take 1 tablet by mouth daily.    . Potassium (POTASSIMIN PO) Take 1 tablet by mouth daily. 595 mg    . Probiotic Product (PROBIOTIC DAILY PO) Take 46 mg by mouth. [employer] probiotic 10     No facility-administered medications prior to visit.     Allergies:   Review of patient's allergies indicates no known allergies.   Social History   Social History  . Marital Status: Divorced    Spouse Name: N/A  . Number of Children: N/A  . Years of Education: N/A   Social History Main Topics  . Smoking status: Never Smoker   . Smokeless tobacco: Never Used  . Alcohol Use: No  . Drug Use: No  . Sexual Activity: Yes   Other Topics Concern  . None   Social History Narrative   Single has girlfriend.  Works at [employer] History:  The patient's family history includes Colon cancer in his maternal uncle and other; Heart disease in his father.   ROS:   Please see the history of present illness.    ROS All other systems reviewed and are negative.   PHYSICAL EXAM:   VS:  BP 120/72 mmHg  Pulse 66  Ht 5\' 9"  (1.753 m)  Wt 74.934 kg (165 lb 3.2 oz)  BMI 24.38 kg/m2   GEN: Well nourished, well developed, in no acute distress HEENT: normal Neck: no JVD, carotid bruits, or masses Cardiac: Irregular,  no murmurs, rubs, or gallops,no edema  Respiratory:  clear to auscultation bilaterally, normal work of breathing GI: soft, nontender, nondistended, + BS MS: no deformity or atrophy Skin: warm and dry, no rash Neuro:  Alert and Oriented x 3, Strength and sensation are intact Psych: euthymic mood, full affect  Wt  Readings from Last 3 Encounters:  01/29/16 74.934 kg (165 lb 3.2 oz)  01/01/16 74.844 kg (165 lb)  09/18/15 74.163 kg (163 lb 8 oz)      Studies/Labs Reviewed:   EKG:  EKG is ordered today.  The ekg ordered today demonstrates a pattern very similar to previous tracings with atrial fibrillation with spontaneously controlled ventricular rates and prominent anterolateral T-wave inversions. The QTc interval remains normal  Recent Labs: 09/18/2015: ALT 26; BUN 23; Creat 0.97; Magnesium 2.4; Potassium 4.6; Sodium 137   Lipid Panel    Component Value Date/Time   CHOL 195 12/06/2013 1112   TRIG 70 12/06/2013 1112   HDL 47 12/06/2013 1112   CHOLHDL 4.1 12/06/2013 1112   VLDL 14 12/06/2013 1112   LDLCALC 134* 12/06/2013 1112    Additional studies/ records that were reviewed today include:  Records from emergency room evaluation 15,  [facility]    ASSESSMENT:    1. Syncope, unspecified syncope type   2. Chronic atrial fibrillation (HCC)   3. Cardiomyopathy (HCC)   4. Sleep apnea: unable to tolerate CPAP   5. Essential hypertension   6. Drug-induced erectile dysfunction      PLAN:  In order of problems listed above:  1. Syncope: Etiology of this is unclear, but it appears to be hemodynamically mediated. Could've had hypotension or bradycardia. His blood pressure is normal today and when he checks it on a regular basis it is often borderline high. Prodromal nausea does suggest a possible vagal event, but he has never had previous vasovagal syncope. I worry that he may have had an arrhythmia. We discussed options for rhythm monitoring with either an external wearable device or an implantable loop recorder. I favor the latter option which will likely have a better diagnostic yield, even though it is a more invasive procedure. 2. A. 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 We have discussed in  the past that this might eventually need to need for pacemaker implantation. Bradycardia/prolonged pauses are in my view the most likely cause of his syncope. He remains at low embolic risk, CHADSVasc score 1, on aspirin. 3. Cardiomyopathy: His EKG suggests apical variant hypertrophic cardiomyopathy, but his echo has never found convincing evidence of this diagnosis. His EKG has been quite abnormal for years. Need to keep in mind the potential risk of ventricular arrhythmias as well. 4. OSA: Not sure if this has any connection to the recent events. He does not have symptoms of daytime hypersomnolence. He did have mildly elevated estimation of systolic PA pressure on his echo from [place]. May need to revisit this diagnosis 5. HTN: Well controlled 6. ED: From a cardiovascular standpoint, I don't think he is at risk for major complications from PDE 5 inhibitors. We did discuss the very serious interaction with nitrates which need to be avoided if he takes medicines for erectile dysfunction. Also reviewed potential side effects for which he should stop this medication.     Medication Adjustments/Labs and Tests Ordered: Current medicines are reviewed at length with the patient today.  Concerns regarding medicines are outlined above.  Medication changes, Labs and Tests ordered today are listed in the Patient Instructions below. Patient Instructions  Dr Royann Shivers recommends that have a loop recorder placed. This will be done at [facility]. You don't have to have any blood work or be fasting for this procedure.  Your physician has recommended you make the following change in your medication: 1. TAKE Viagra 50 mg as needed  >>A new prescription has been sent to your pharmacy electronically  Dr Royann Shivers recommends that you schedule a follow-up appointment in 6 months. You will receive a reminder letter in the mail two months in advance. If you don't receive a letter, please call our office to  schedule the follow-up appointment.  If you need a refill on your cardiac medications before your next appointment, please call your pharmacy.       Joie Bimler, MD  01/29/2016 10:30 AM    [facility] [address] [place], [place], [place]  [address] Phone: [phone]; Fax: [phone]

## 2016-02-05 ENCOUNTER — Telehealth: Payer: Self-pay

## 2016-02-05 NOTE — Telephone Encounter (Signed)
lmtcb

## 2016-02-06 ENCOUNTER — Other Ambulatory Visit: Payer: Self-pay | Admitting: Family Medicine

## 2016-02-06 ENCOUNTER — Telehealth: Payer: Self-pay | Admitting: Cardiovascular Disease

## 2016-02-06 DIAGNOSIS — S32009D Unspecified fracture of unspecified lumbar vertebra, subsequent encounter for fracture with routine healing: Secondary | ICD-10-CM

## 2016-02-06 NOTE — Telephone Encounter (Signed)
Answered questions for patient about upcoming loop recorder implant. He voiced understanding of instructions to fast before procedure and to arrive at short stay 2 hrs early. Note that Wayne Molina had msg for pt to call - will route to see if any further addendum needs to be communicated to patient.

## 2016-02-06 NOTE — Telephone Encounter (Signed)
New message   Pt is wanting rn to return his call

## 2016-02-07 ENCOUNTER — Other Ambulatory Visit: Payer: Self-pay

## 2016-02-07 NOTE — Telephone Encounter (Signed)
See phone call from 02/06/16.

## 2016-02-07 NOTE — Telephone Encounter (Signed)
Called patient to possibly move his ILR implant from Tuesday, 4/4, to Monday, 4/3.  This will not work for patient.  Patient wants to proceed with procedure on 4/4.

## 2016-02-11 ENCOUNTER — Ambulatory Visit (HOSPITAL_COMMUNITY)
Admission: RE | Admit: 2016-02-11 | Discharge: 2016-02-11 | Disposition: A | Discharge status: Home / Self Care | Payer: 59 | Source: Ambulatory Visit | Attending: Cardiovascular Disease | Admitting: Cardiovascular Disease

## 2016-02-11 ENCOUNTER — Encounter (HOSPITAL_COMMUNITY): Payer: Self-pay | Admitting: Cardiovascular Disease

## 2016-02-11 ENCOUNTER — Encounter (HOSPITAL_COMMUNITY)
Admission: RE | Disposition: A | Discharge status: Home / Self Care | Payer: Self-pay | Source: Ambulatory Visit | Attending: Cardiovascular Disease

## 2016-02-11 DIAGNOSIS — N522 Drug-induced erectile dysfunction: Secondary | ICD-10-CM | POA: Insufficient documentation

## 2016-02-11 DIAGNOSIS — Z79899 Other long term (current) drug therapy: Secondary | ICD-10-CM | POA: Insufficient documentation

## 2016-02-11 DIAGNOSIS — E785 Hyperlipidemia, unspecified: Secondary | ICD-10-CM | POA: Diagnosis not present

## 2016-02-11 DIAGNOSIS — G4733 Obstructive sleep apnea (adult) (pediatric): Secondary | ICD-10-CM | POA: Insufficient documentation

## 2016-02-11 DIAGNOSIS — M19049 Primary osteoarthritis, unspecified hand: Secondary | ICD-10-CM | POA: Diagnosis not present

## 2016-02-11 DIAGNOSIS — Z7982 Long term (current) use of aspirin: Secondary | ICD-10-CM | POA: Insufficient documentation

## 2016-02-11 DIAGNOSIS — I482 Chronic atrial fibrillation: Secondary | ICD-10-CM | POA: Insufficient documentation

## 2016-02-11 DIAGNOSIS — R55 Syncope and collapse: Secondary | ICD-10-CM | POA: Insufficient documentation

## 2016-02-11 DIAGNOSIS — I429 Cardiomyopathy, unspecified: Secondary | ICD-10-CM | POA: Insufficient documentation

## 2016-02-11 DIAGNOSIS — I10 Essential (primary) hypertension: Secondary | ICD-10-CM | POA: Diagnosis not present

## 2016-02-11 HISTORY — PX: EP IMPLANTABLE DEVICE: SHX172B

## 2016-02-11 SURGERY — LOOP RECORDER INSERTION

## 2016-02-11 MED ORDER — LIDOCAINE-EPINEPHRINE 1 %-1:100000 IJ SOLN
INTRAMUSCULAR | Status: AC
  Filled 2016-02-11: qty 1

## 2016-02-11 MED ORDER — LIDOCAINE-EPINEPHRINE 1 %-1:100000 IJ SOLN
INTRAMUSCULAR | Status: DC | PRN
Start: 2016-02-11 — End: 2016-02-11
  Administered 2016-02-11: 15 mL

## 2016-02-11 SURGICAL SUPPLY — 2 items
LOOP REVEAL LINQSYS (Prosthesis & Implant Heart) ×2 IMPLANT
PACK LOOP INSERTION (CUSTOM PROCEDURE TRAY) ×2 IMPLANT

## 2016-02-11 NOTE — H&P (View-Only) (Signed)
Patient ID: Wayne Molina, male   DOB: 21-Jan-1956, 60 y.o.   MRN: [id number]    Cardiology Office Note    Date:  01/29/2016   ID:  Wayne Molina, DOB 08-06-1956, MRN [id number]  PCP:  Carney Living, MD  Cardiologist:   Thurmon Fair, MD   Chief Complaint  Patient presents with  . Loss of Consciousness    patient reports 1 episode (on the 15th or 16th) he passed out, states he was feeling bad and was heading to the er prior to passing out. his symptoms haven't reoccured. patient does report weakness - his bp (which is normally 140/90) has been low for him.    History of Present Illness:  Wayne Molina is a 60 y.o. male the recent episode of syncope, that occurred roughly one month ago. This is the first time he has ever lost consciousness. During sexual intercourse he became dyspneic. Following intercourse he felt weak and nauseous. His girlfriend encouraged him to go to the emergency room but he just wanted to go home. As he was heading out the door he lost consciousness and fell. He had a small head injury with a tiny laceration and a small hematoma with no serious injury. There was no evidence of convulsions or loss of sphincter control. Loss of consciousness was very brief.   Reportedly his blood pressure was normal by the time it was checked. Overnight telemetry did not show meaningful arrhythmia, other than his permanent atrial fibrillation. His ECG, as before shows marked anterolateral ST-T wave abnormalities, similar to previous tracings. The QT see interval was 429 ms. Echocardiogram showed normal left ventricular systolic function and biatrial dilatation with mild-moderate mitral and tricuspid insufficiency, similar to previous studies. The study describes mild concentric LVH. It also reports normal diastolic function and very mild elevation in pulmonary artery pressure at 30-40 mmHg. Head CT and chest x-ray were normal. Labs were normal with a creatinine of 0.9, borderline but stable  troponin and normal CK-MB.  Independent of the above events, Wayne Molina has noticed erectile dysfunction. He has no difficulty achieving an erection, but he loses it quickly during intercourse.  Wayne Molina has permanent atrial fibrillation with spontaneously controlled ventricular response then abnormal ECG suggestive of hypertrophic cardiomyopathy but without evidence of any structural abnormalities of the left ventricle by conventional imaging methods. He has biatrial dilatation by echocardiography. He has normal left ventricular systolic function. He has mild to moderate mitral and tricuspid insufficiency, unlikely to have a causal relationship with his atrial fibrillation. He has previously been diagnosed with sleep apnea but has been unable to tolerate CPAP. He does not have any symptoms of hypersomnolence. He has not had any focal neurological events and has never had embolic complications from his chronic atrial fibrillation. He has normal left ventricular systolic function and had a normal nuclear stress test in the past.  Past Medical History  Diagnosis Date  . Anxiety   . Arthritis     fingers  . Depression   . Hyperlipidemia   . Hypertension   . Sleep apnea   . Personal history of colonic polyps 11/12/2006  . Family history of malignant neoplasm of gastrointestinal tract   . Chronic atrial fibrillation [facility])     Past Surgical History  Procedure Laterality Date  . Hip fracture surgery  1970s  . Atrial ablation surgery  2004  . 2-d echocardiogram  07/22/2010    Ejection fraction greater than 55%. Left atrium moderately dilated. Right atrium moderately dilated.  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 Mild to Moderate MR. Mild to moderate TR.  Marland Kitchen Exercise myoview stress test  07/08/2000    Nonischemic low-risk.    Outpatient Prescriptions Prior to Visit  Medication Sig Dispense Refill  . aspirin 81 MG tablet Take 81 mg by mouth daily.      . Glucosamine-Chondroit-Vit C-Mn (GLUCOSAMINE 1500  COMPLEX PO) Take 1,500 mg by mouth as needed.     Marland Kitchen ibuprofen (ADVIL,MOTRIN) 800 MG tablet Take 1 tablet (800 mg total) by mouth every 8 (eight) hours as needed. Take with food. 30 tablet 3  . losartan (COZAAR) 50 MG tablet Take 2 tablets (100 mg total) by mouth daily. 180 tablet 3  . MAGNESIUM OXIDE PO Take 1 tablet by mouth daily. 250 mg    . MULTIPLE VITAMIN PO Take 1 tablet by mouth daily.    . Potassium (POTASSIMIN PO) Take 1 tablet by mouth daily. 595 mg    . Probiotic Product (PROBIOTIC DAILY PO) Take 46 mg by mouth. [employer] probiotic 10     No facility-administered medications prior to visit.     Allergies:   Review of patient's allergies indicates no known allergies.   Social History   Social History  . Marital Status: Divorced    Spouse Name: N/A  . Number of Children: N/A  . Years of Education: N/A   Social History Main Topics  . Smoking status: Never Smoker   . Smokeless tobacco: Never Used  . Alcohol Use: No  . Drug Use: No  . Sexual Activity: Yes   Other Topics Concern  . None   Social History Narrative   Single has girlfriend.  Works at [employer] History:  The patient's family history includes Colon cancer in his maternal uncle and other; Heart disease in his father.   ROS:   Please see the history of present illness.    ROS All other systems reviewed and are negative.   PHYSICAL EXAM:   VS:  BP 120/72 mmHg  Pulse 66  Ht 5\' 9"  (1.753 m)  Wt 74.934 kg (165 lb 3.2 oz)  BMI 24.38 kg/m2   GEN: Well nourished, well developed, in no acute distress HEENT: normal Neck: no JVD, carotid bruits, or masses Cardiac: Irregular,  no murmurs, rubs, or gallops,no edema  Respiratory:  clear to auscultation bilaterally, normal work of breathing GI: soft, nontender, nondistended, + BS MS: no deformity or atrophy Skin: warm and dry, no rash Neuro:  Alert and Oriented x 3, Strength and sensation are intact Psych: euthymic mood, full affect  Wt  Readings from Last 3 Encounters:  01/29/16 74.934 kg (165 lb 3.2 oz)  01/01/16 74.844 kg (165 lb)  09/18/15 74.163 kg (163 lb 8 oz)      Studies/Labs Reviewed:   EKG:  EKG is ordered today.  The ekg ordered today demonstrates a pattern very similar to previous tracings with atrial fibrillation with spontaneously controlled ventricular rates and prominent anterolateral T-wave inversions. The QTc interval remains normal  Recent Labs: 09/18/2015: ALT 26; BUN 23; Creat 0.97; Magnesium 2.4; Potassium 4.6; Sodium 137   Lipid Panel    Component Value Date/Time   CHOL 195 12/06/2013 1112   TRIG 70 12/06/2013 1112   HDL 47 12/06/2013 1112   CHOLHDL 4.1 12/06/2013 1112   VLDL 14 12/06/2013 1112   LDLCALC 134* 12/06/2013 1112    Additional studies/ records that were reviewed today include:  Records from emergency room evaluation 15,  [facility]    ASSESSMENT:    1. Syncope, unspecified syncope type   2. Chronic atrial fibrillation (HCC)   3. Cardiomyopathy (HCC)   4. Sleep apnea: unable to tolerate CPAP   5. Essential hypertension   6. Drug-induced erectile dysfunction      PLAN:  In order of problems listed above:  1. Syncope: Etiology of this is unclear, but it appears to be hemodynamically mediated. Could've had hypotension or bradycardia. His blood pressure is normal today and when he checks it on a regular basis it is often borderline high. Prodromal nausea does suggest a possible vagal event, but he has never had previous vasovagal syncope. I worry that he may have had an arrhythmia. We discussed options for rhythm monitoring with either an external wearable device or an implantable loop recorder. I favor the latter option which will likely have a better diagnostic yield, even though it is a more invasive procedure. 2. A. 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 We have discussed in  the past that this might eventually need to need for pacemaker implantation. Bradycardia/prolonged pauses are in my view the most likely cause of his syncope. He remains at low embolic risk, CHADSVasc score 1, on aspirin. 3. Cardiomyopathy: His EKG suggests apical variant hypertrophic cardiomyopathy, but his echo has never found convincing evidence of this diagnosis. His EKG has been quite abnormal for years. Need to keep in mind the potential risk of ventricular arrhythmias as well. 4. OSA: Not sure if this has any connection to the recent events. He does not have symptoms of daytime hypersomnolence. He did have mildly elevated estimation of systolic PA pressure on his echo from [place]. May need to revisit this diagnosis 5. HTN: Well controlled 6. ED: From a cardiovascular standpoint, I don't think he is at risk for major complications from PDE 5 inhibitors. We did discuss the very serious interaction with nitrates which need to be avoided if he takes medicines for erectile dysfunction. Also reviewed potential side effects for which he should stop this medication.     Medication Adjustments/Labs and Tests Ordered: Current medicines are reviewed at length with the patient today.  Concerns regarding medicines are outlined above.  Medication changes, Labs and Tests ordered today are listed in the Patient Instructions below. Patient Instructions  Dr Royann Shivers recommends that have a loop recorder placed. This will be done at [facility]. You don't have to have any blood work or be fasting for this procedure.  Your physician has recommended you make the following change in your medication: 1. TAKE Viagra 50 mg as needed  >>A new prescription has been sent to your pharmacy electronically  Dr Royann Shivers recommends that you schedule a follow-up appointment in 6 months. You will receive a reminder letter in the mail two months in advance. If you don't receive a letter, please call our office to  schedule the follow-up appointment.  If you need a refill on your cardiac medications before your next appointment, please call your pharmacy.       Joie Bimler, MD  01/29/2016 10:30 AM    [facility] [address] [place], [place], [place]  [address] Phone: [phone]; Fax: [phone]

## 2016-02-11 NOTE — Interval H&P Note (Signed)
History and Physical Interval Note:  02/11/2016 11:47 AM  Wayne Molina  has presented today for surgery, with the diagnosis of syncope  The various methods of treatment have been discussed with the patient and family. After consideration of risks, benefits and other options for treatment, the patient has consented to  Procedure(s): Loop Recorder Insertion (N/A) as a surgical intervention .  The patient's history has been reviewed, patient examined, no change in status, stable for surgery.  I have reviewed the patient's chart and labs.  Questions were answered to the patient's satisfaction.     Levander Katzenstein

## 2016-02-11 NOTE — Op Note (Addendum)
LOOP RECORDER IMPLANT   Procedure report  Procedure performed:  Loop recorder implantation   Reason for procedure:  Syncope Procedure performed by:  Thurmon Fair, MD  Complications:  None  Estimated blood loss:  <5 mL  Medications administered during procedure:  Lidocaine 1% with 1/10,000 epinephrine 10 mL locally Device details:  Medtronic Reveal Linq model number [id number], serial number [id number] Procedure details:  After the risks and benefits of the procedure were discussed the patient provided informed consent. The patient was prepped and draped in usual sterile fashion. Local anesthesia was administered to an area 2 cm to the left of the sternum in the 4th intercostal space. A cutaneous incision was made using the incision tool. The introducer was then used to create a subcutaneous tunnel and carefully deploy the device. Local pressure was held to ensure hemostasis.  The incision was closed with SteriStrips and a sterile dressing was applied.  R waves 0.33mV  Thurmon Fair, MD, [facility] [phone] office [phone] pager 02/11/2016 1:59 PM

## 2016-02-11 NOTE — Discharge Instructions (Signed)
Supplemental Discharge Instructions for  °Pacemaker/Defibrillator Patients ° °Activity °No restrictions ° °WOUND CARE °  Keep the wound area clean and dry.  Remove the dressing the day after you return home (usually 48 hours after the procedure). °  DO NOT SUBMERGE UNDER WATER UNTIL FULLY HEALED (no tub baths, hot tubs, swimming pools, etc.).  °  You  may shower or take a sponge bath after the dressing is removed. DO NOT SOAK the area and do not allow the shower to directly spray on the site. °  If you have staples, these will be removed in the office in 7-14 days. °  If you have tape/steri-strips on your wound, these will fall off; do not pull them off prematurely.   °  No bandage is needed on the site.  DO  NOT apply any creams, oils, or ointments to the wound area. °  If you notice any drainage or discharge from the wound, any swelling, excessive redness or bruising at the site, or if you develop a fever > 101? F after you are discharged home, call the office at once. ° °Special Instructions °  You are still able to use cellular telephones.  Avoid carrying your cellular phone near your device. °  When traveling through airports, show security personnel your identification card to avoid being screened in the metal detectors.  °  Avoid arc welding equipment, MRI testing (magnetic resonance imaging), TENS units (transcutaneous nerve stimulators).  Call the office for questions about other devices. °  Avoid electrical appliances that are in poor condition or are not properly grounded. °  Microwave ovens are safe to be near or to operate. ° ° ° °

## 2016-02-14 ENCOUNTER — Telehealth: Payer: Self-pay | Admitting: *Deleted

## 2016-02-14 NOTE — Telephone Encounter (Signed)
LMOM requesting call back.  Gave device clinic phone number for return call. 

## 2016-02-17 NOTE — Telephone Encounter (Signed)
Patient returned call.  He states that he was tired and sore after the ILR implant on 02/11/16, but that he was otherwise asymptomatic.  Instructed patient on use of symptom activator.  Patient sent manual transmission for review as he reports having had an episode where he felt faint a few days after ILR implant.  No daytime pauses or brady episodes recorded since 02/11/16.  Patient is appreciative of call and is aware of his wound check appointment on 02/20/16.  He denies additional questions or concerns at this time.

## 2016-02-20 ENCOUNTER — Ambulatory Visit (INDEPENDENT_AMBULATORY_CARE_PROVIDER_SITE_OTHER): Payer: 59 | Admitting: *Deleted

## 2016-02-20 DIAGNOSIS — I482 Chronic atrial fibrillation, unspecified: Secondary | ICD-10-CM

## 2016-02-20 NOTE — Progress Notes (Addendum)
Wound check appointment. Steri-strips and gauze removed. Wound without redness or edema. Slight bruising present at site Incision edges approximated, wound well healed. Wound care instructions provided with S/S of infection. Loop check in clinic. Battery status: ok. R-waves 1.70mV. 0 symptom episodes, 0 tachy episodes, 5 pause episodes, 0 brady episodes.  AF burden 100%. Three pause episodes false, r/t implantation. 2 appropriate pause episodes lasting 3 seconds in length.  Monthly summary reports and ROV with [facility] 9/18

## 2016-02-21 ENCOUNTER — Telehealth: Payer: Self-pay | Admitting: *Deleted

## 2016-02-21 LAB — CUP PACEART INCLINIC DEVICE CHECK: Date Time Interrogation Session: 20170413152737

## 2016-02-21 NOTE — Telephone Encounter (Signed)
[facility] requesting that patient send a manual Carelink transmission.  Gave device clinic phone number for questions or concerns.

## 2016-02-24 NOTE — Telephone Encounter (Signed)
Manual transmission received.  Encounter closed.

## 2016-03-12 ENCOUNTER — Ambulatory Visit (INDEPENDENT_AMBULATORY_CARE_PROVIDER_SITE_OTHER): Payer: 59 | Admitting: *Deleted

## 2016-03-12 DIAGNOSIS — R55 Syncope and collapse: Secondary | ICD-10-CM | POA: Diagnosis not present

## 2016-03-16 NOTE — Progress Notes (Signed)
Carelink Summary Report / Loop Recorder 

## 2016-04-01 ENCOUNTER — Encounter: Payer: Self-pay | Admitting: Cardiovascular Disease

## 2016-04-08 ENCOUNTER — Encounter: Payer: Self-pay | Admitting: Cardiovascular Disease

## 2016-04-08 MED FILL — LOSARTAN POTASSIUM 50 MG TA: 50 | 90 days supply | Qty: 180 | Fill #1

## 2016-04-10 ENCOUNTER — Encounter: Payer: Self-pay | Admitting: Cardiovascular Disease

## 2016-04-13 ENCOUNTER — Ambulatory Visit (INDEPENDENT_AMBULATORY_CARE_PROVIDER_SITE_OTHER): Payer: 59 | Admitting: *Deleted

## 2016-04-13 DIAGNOSIS — R55 Syncope and collapse: Secondary | ICD-10-CM | POA: Diagnosis not present

## 2016-04-13 NOTE — Progress Notes (Signed)
Carelink Summary Report / Loop Recorder 

## 2016-04-19 LAB — CUP PACEART REMOTE DEVICE CHECK: MDC IDC SESS DTM: 20170504170926

## 2016-04-19 NOTE — Progress Notes (Signed)
Carelink summary report received. Battery status OK. Normal device function. No new symptom episodes, brady, or pause episodes. 100% AF. +ASA 81mg , CHADVasc 1 per [facility] note 01/2016. 2 new tachy- ECGs appear AF w RVR. Monthly summary reports and ROV/PRN

## 2016-05-11 ENCOUNTER — Ambulatory Visit (INDEPENDENT_AMBULATORY_CARE_PROVIDER_SITE_OTHER): Payer: 59 | Admitting: *Deleted

## 2016-05-11 DIAGNOSIS — R55 Syncope and collapse: Secondary | ICD-10-CM | POA: Diagnosis not present

## 2016-05-11 NOTE — Progress Notes (Signed)
Carelink Summary Report / Loop Recorder 

## 2016-05-21 LAB — CUP PACEART REMOTE DEVICE CHECK: Date Time Interrogation Session: 20170603173841

## 2016-05-29 LAB — CUP PACEART REMOTE DEVICE CHECK: MDC IDC SESS DTM: 20170703181139

## 2016-06-10 ENCOUNTER — Ambulatory Visit (INDEPENDENT_AMBULATORY_CARE_PROVIDER_SITE_OTHER): Payer: 59 | Admitting: *Deleted

## 2016-06-10 DIAGNOSIS — R55 Syncope and collapse: Secondary | ICD-10-CM

## 2016-06-10 NOTE — Progress Notes (Signed)
Carelink Summary Report / Loop Recorder 

## 2016-07-02 ENCOUNTER — Telehealth: Payer: Self-pay | Admitting: Cardiology

## 2016-07-02 NOTE — Telephone Encounter (Signed)
[name] requesting that pt send manual transmission b/c home monitor has not updated in at least 14 days.    

## 2016-07-05 LAB — CUP PACEART REMOTE DEVICE CHECK: Date Time Interrogation Session: 20170802184023

## 2016-07-10 ENCOUNTER — Ambulatory Visit (INDEPENDENT_AMBULATORY_CARE_PROVIDER_SITE_OTHER): Payer: 59 | Admitting: *Deleted

## 2016-07-10 DIAGNOSIS — R55 Syncope and collapse: Secondary | ICD-10-CM | POA: Diagnosis not present

## 2016-07-10 NOTE — Progress Notes (Signed)
Carelink Summary Report / Loop Recorder 

## 2016-07-20 MED FILL — LOSARTAN POTASSIUM 50 MG TA: 50 | 90 days supply | Qty: 180 | Fill #2

## 2016-08-03 LAB — CUP PACEART REMOTE DEVICE CHECK: Date Time Interrogation Session: 20170901184106

## 2016-08-05 ENCOUNTER — Encounter: Payer: Self-pay | Admitting: Cardiovascular Disease

## 2016-08-05 ENCOUNTER — Ambulatory Visit (INDEPENDENT_AMBULATORY_CARE_PROVIDER_SITE_OTHER): Payer: 59 | Admitting: Cardiovascular Disease

## 2016-08-05 VITALS — BP 135/83 | HR 79 | Ht 69.0 in | Wt 159.8 lb

## 2016-08-05 DIAGNOSIS — I482 Chronic atrial fibrillation, unspecified: Secondary | ICD-10-CM

## 2016-08-05 DIAGNOSIS — I10 Essential (primary) hypertension: Secondary | ICD-10-CM

## 2016-08-05 DIAGNOSIS — I429 Cardiomyopathy, unspecified: Secondary | ICD-10-CM

## 2016-08-05 DIAGNOSIS — G4730 Sleep apnea, unspecified: Secondary | ICD-10-CM

## 2016-08-05 DIAGNOSIS — R55 Syncope and collapse: Secondary | ICD-10-CM

## 2016-08-05 NOTE — Progress Notes (Signed)
Patient ID: Wayne Frameoney L Cobin, male   DOB: 09/22/1956, 60 y.o.   MRN: [id number]    Cardiology Office Note    Date:  08/05/2016   ID:  Wayne Frameoney L Stemler, DOB 07/24/1956, MRN [id number]  PCP:  Carney LivingMarshall L Chambliss, MD  Cardiologist:   Thurmon FairMihai Tamerra Merkley, MD   Chief Complaint  Patient presents with  . Follow-up    6 Months; Pt states no Sx    History of Present Illness:  Wayne Molina is a 60 y.o. male who returns in follow-up for roughly 7 months following a syncopal event and roughly 6 months following implantation of a loop recorder. He has not had syncope since then. He has had a couple of episodes of dizziness without full blown syncope, without evidence of an arrhythmic etiology on loop recorder interrogation.  Laveda Normanoney has permanent atrial fibrillation with spontaneously controlled ventricular response. He has long had an abnormal ECG suggestive of hypertrophic cardiomyopathy but without evidence of any structural abnormalities of the left ventricle by conventional imaging methods. He has biatrial dilatation by echocardiography. He has normal left ventricular systolic function. He has mild to moderate mitral and tricuspid insufficiency, unlikely to have a causal relationship with his atrial fibrillation. He has previously been diagnosed with sleep apnea but has been unable to tolerate CPAP. He does not have any symptoms of hypersomnolence. He has not had any focal neurological events and has never had embolic complications from his chronic atrial fibrillation. He has normal left ventricular systolic function and had a normal nuclear stress test in the past.  Past Medical History:  Diagnosis Date  . Anxiety   . Arthritis    fingers  . Chronic atrial fibrillation (HCC)   . Depression   . Family history of malignant neoplasm of gastrointestinal tract   . Hyperlipidemia   . Hypertension   . Personal history of colonic polyps 11/12/2006  . Sleep apnea     Past Surgical History:  Procedure Laterality  Date  . 2-D echocardiogram  07/22/2010   Ejection fraction greater than 55%. Left atrium moderately dilated. Right atrium moderately dilated. 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 Mild to Moderate MR. Mild to moderate TR.  . ATRIAL ABLATION SURGERY  2004  . EP IMPLANTABLE DEVICE N/A 02/11/2016   Procedure: Loop Recorder Insertion;  Surgeon: Thurmon FairMihai Jaime Grizzell, MD;  Location: [facility] INVASIVE CV LAB;  Service: Cardiovascular;  Laterality: N/A;  . Exercise Myoview stress test  07/08/2000   Nonischemic low-risk.  Marland Kitchen. HIP FRACTURE SURGERY  1970s    Outpatient Medications Prior to Visit  Medication Sig Dispense Refill  . aspirin 81 MG tablet Take 81 mg by mouth daily.      Marland Kitchen. ibuprofen (ADVIL,MOTRIN) 800 MG tablet Take 800 mg by mouth every 8 (eight) hours as needed for mild pain or moderate pain.    Marland Kitchen. losartan (COZAAR) 50 MG tablet Take 2 tablets (100 mg total) by mouth daily. 180 tablet 3  . Magnesium 250 MG TABS Take 1 tablet by mouth daily.    . MULTIPLE VITAMIN PO Take 1 tablet by mouth daily.    . potassium gluconate 595 (99 K) MG TABS tablet Take 595 mg by mouth daily.    . Probiotic Product (PROBIOTIC DAILY PO) Take 46 mg by mouth. [employer] probiotic 10    . sildenafil (VIAGRA) 50 MG tablet Take 1 tablet (50 mg total) by mouth daily as needed for erectile dysfunction. 10 tablet 2   No facility-administered medications prior to visit.  Allergies:   Review of patient's allergies indicates no known allergies.   Social History   Social History  . Marital status: Divorced    Spouse name: N/A  . Number of children: N/A  . Years of education: N/A   Social History Main Topics  . Smoking status: Never Smoker  . Smokeless tobacco: Never Used  . Alcohol use No  . Drug use: No  . Sexual activity: Yes   Other Topics Concern  . None   Social History Narrative   Single has girlfriend.  Works at [employer] History:  The patient's family history includes Colon cancer in his maternal  uncle and other; Heart disease in his father.   ROS:   Please see the history of present illness.    ROS All other systems reviewed and are negative.   PHYSICAL EXAM:   VS:  BP 135/83   Pulse 79   Ht 5\' 9"  (1.753 m)   Wt 72.5 kg (159 lb 12.8 oz)   BMI 23.60 kg/m    GEN: Well nourished, well developed, in no acute distress  HEENT: normal  Neck: no JVD, carotid bruits, or masses Cardiac: Irregular,  no murmurs, rubs, or gallops,no edema  Respiratory:  clear to auscultation bilaterally, normal work of breathing GI: soft, nontender, nondistended, + BS MS: no deformity or atrophy  Skin: warm and dry, no rash Neuro:  Alert and Oriented x 3, Strength and sensation are intact Psych: euthymic mood, full affect  Wt Readings from Last 3 Encounters:  08/05/16 72.5 kg (159 lb 12.8 oz)  02/11/16 74.8 kg (165 lb)  01/29/16 74.9 kg (165 lb 3.2 oz)      Studies/Labs Reviewed:   EKG:  EKG is ordered today.  The ekg ordered today demonstrates a pattern very similar to previous tracings with atrial fibrillation with spontaneously controlled ventricular rates and prominent anterolateral T-wave inversions. The QTc interval remains normal  Recent Labs: 09/18/2015: ALT 26; BUN 23; Creat 0.97; Magnesium 2.4; Potassium 4.6; Sodium 137   Lipid Panel    Component Value Date/Time   CHOL 195 12/06/2013 1112   TRIG 70 12/06/2013 1112   HDL 47 12/06/2013 1112   CHOLHDL 4.1 12/06/2013 1112   VLDL 14 12/06/2013 1112   LDLCALC 134 (H) 12/06/2013 1112    Additional studies/ records that were reviewed today include:  Records from emergency room evaluation 15, [facility]    ASSESSMENT:    1. Syncope, unspecified syncope type   2. Chronic atrial fibrillation (HCC)   3. Cardiomyopathy (HCC)   4. Sleep apnea: unable to tolerate CPAP   5. Essential hypertension      PLAN:  In order of problems listed above:  1. Syncope: Etiology of this is unclear, but it appears to be hemodynamically mediated,  Possibly vasovagal syncope. So far no significant arrhythmia on his implantable loop recorder.  2. A. Fib: Not requiring rate control medications, suggesting that he has significant AV node disease. We have discussed in the past that this might eventually need pacemaker implantation. He remains at low embolic risk, CHADSVasc score 1, on aspirin. 3. Cardiomyopathy: His EKG suggests apical variant hypertrophic cardiomyopathy, but his echo has never found convincing evidence of this diagnosis. His EKG has been quite abnormal for years. Need to keep in mind the potential risk of ventricular arrhythmias as well. 4. OSA: Not sure if this has any connection to the recent events. He does not have symptoms of daytime hypersomnolence.  He did have mildly elevated estimation of systolic PA pressure on his echo from [place], but this measurement may be quite inaccurate in a patient with irregular rhythm. He exercises at the gym without any complaints of dyspnea.  5. HTN: Well controlled.    Medication Adjustments/Labs and Tests Ordered: Current medicines are reviewed at length with the patient today.  Concerns regarding medicines are outlined above.  Medication changes, Labs and Tests ordered today are listed in the Patient Instructions below. Patient Instructions  Dr Royann Shivers recommends that you schedule a follow-up appointment in 1 year. You will receive a reminder letter in the mail two months in advance. If you don't receive a letter, please call our office to schedule the follow-up appointment.  If you need a refill on your cardiac medications before your next appointment, please call your pharmacy.      Signed, Thurmon Fair, MD  08/05/2016 11:29 AM    [facility] [address] [place], [place], [place]  [address] Phone: [phone]; Fax: [phone]

## 2016-08-05 NOTE — Patient Instructions (Signed)
Dr Croitoru recommends that you schedule a follow-up appointment in 1 year. You will receive a reminder letter in the mail two months in advance. If you don't receive a letter, please call our office to schedule the follow-up appointment.  If you need a refill on your cardiac medications before your next appointment, please call your pharmacy. 

## 2016-08-10 ENCOUNTER — Ambulatory Visit (INDEPENDENT_AMBULATORY_CARE_PROVIDER_SITE_OTHER): Payer: 59 | Admitting: *Deleted

## 2016-08-10 DIAGNOSIS — R55 Syncope and collapse: Secondary | ICD-10-CM | POA: Diagnosis not present

## 2016-08-10 NOTE — Progress Notes (Signed)
Carelink Summary Report / Loop Recorder 

## 2016-09-08 ENCOUNTER — Ambulatory Visit (INDEPENDENT_AMBULATORY_CARE_PROVIDER_SITE_OTHER): Payer: 59 | Admitting: *Deleted

## 2016-09-08 DIAGNOSIS — R55 Syncope and collapse: Secondary | ICD-10-CM

## 2016-09-08 LAB — CUP PACEART REMOTE DEVICE CHECK
Implantable Pulse Generator Implant Date: 20170404
MDC IDC SESS DTM: 20171031210643

## 2016-09-09 NOTE — Progress Notes (Signed)
Carelink Summary Report / Loop Recorder 

## 2016-09-10 ENCOUNTER — Telehealth: Payer: Self-pay | Admitting: *Deleted

## 2016-09-10 NOTE — Telephone Encounter (Signed)
[facility] requesting that patient send a manual transmission from his home monitor.  Gave Device Clinic phone number for questions/concerns.

## 2016-09-11 NOTE — Telephone Encounter (Signed)
Manual transmission successfully received on 09/11/16.  No new symptom episodes noted.

## 2016-09-18 LAB — CUP PACEART REMOTE DEVICE CHECK
MDC IDC PG IMPLANT DT: 20170404
MDC IDC SESS DTM: 20171001204004

## 2016-09-18 NOTE — Progress Notes (Signed)
Carelink summary report received. Battery status OK. Normal device function. No new symptom episodes, or brady episodes. No new AF episodes. 9 tachy- 1 with ECG appears AF w/ RVR. 67 pause, all previously reviewed except 1 which appears true 3 second pause at 0347. Monthly summary reports and ROV/PRN

## 2016-10-08 ENCOUNTER — Ambulatory Visit (INDEPENDENT_AMBULATORY_CARE_PROVIDER_SITE_OTHER): Payer: 59 | Admitting: *Deleted

## 2016-10-08 DIAGNOSIS — R55 Syncope and collapse: Secondary | ICD-10-CM | POA: Diagnosis not present

## 2016-10-09 NOTE — Progress Notes (Signed)
Carelink Summary Report / Loop Recorder 

## 2016-10-16 NOTE — Progress Notes (Signed)
Carelink summary report received. Battery status OK. Normal device function. No new symptom episodes or brady episodes. 3 tachy- 1 ECG appears AF w/ RVR, noise, and PVC. 69 pause- all on list appear nocturnal except 1 occurring in the afternoon on 08/19/16, no symptoms reported. 100% AF CHADSVasc 1 +ASA. Monthly summary reports and ROV/PRN

## 2016-11-10 ENCOUNTER — Ambulatory Visit (INDEPENDENT_AMBULATORY_CARE_PROVIDER_SITE_OTHER): Payer: 59 | Admitting: *Deleted

## 2016-11-10 DIAGNOSIS — R55 Syncope and collapse: Secondary | ICD-10-CM | POA: Diagnosis not present

## 2016-11-10 LAB — CUP PACEART REMOTE DEVICE CHECK
Date Time Interrogation Session: 20171230213538
MDC IDC PG IMPLANT DT: 20170404

## 2016-11-10 MED FILL — LOSARTAN POTASSIUM 50 MG TA: 50 | 90 days supply | Qty: 180 | Fill #3

## 2016-11-11 NOTE — Progress Notes (Signed)
Carelink Summary Report 

## 2016-11-18 LAB — CUP PACEART REMOTE DEVICE CHECK
Implantable Pulse Generator Implant Date: 20170404
MDC IDC SESS DTM: 20171130214131

## 2016-12-07 ENCOUNTER — Ambulatory Visit (INDEPENDENT_AMBULATORY_CARE_PROVIDER_SITE_OTHER): Payer: 59 | Admitting: *Deleted

## 2016-12-07 DIAGNOSIS — R55 Syncope and collapse: Secondary | ICD-10-CM | POA: Diagnosis not present

## 2016-12-07 MED FILL — SILDENAFIL 50 MG TABLET: 50 | 30 days supply | Qty: 6 | Fill #1

## 2016-12-08 NOTE — Progress Notes (Signed)
Carelink Summary Report / Loop Recorder 

## 2016-12-16 ENCOUNTER — Ambulatory Visit (INDEPENDENT_AMBULATORY_CARE_PROVIDER_SITE_OTHER): Payer: 59 | Admitting: Internal Medicine

## 2016-12-16 VITALS — BP 108/62 | HR 93 | Temp 98.1°F | Ht 69.0 in | Wt 170.4 lb

## 2016-12-16 DIAGNOSIS — J069 Acute upper respiratory infection, unspecified: Secondary | ICD-10-CM | POA: Insufficient documentation

## 2016-12-16 DIAGNOSIS — B9789 Other viral agents as the cause of diseases classified elsewhere: Secondary | ICD-10-CM

## 2016-12-16 MED ORDER — BENZONATATE 100 MG PO CAPS: 100.0000 mg | ORAL_CAPSULE | Freq: Three times a day (TID) | ORAL | 0 refills | Status: DC | PRN

## 2016-12-16 MED FILL — BENZONATATE 100 MG CAP: 100 | 7 days supply | Qty: 20 | Fill #0

## 2016-12-16 NOTE — Assessment & Plan Note (Addendum)
Patient with viral URI, resolving. -Continue symptomatic treatment -Continue rest and fluids as needed -As patient is not febrile and is feeling better can return back to work

## 2016-12-16 NOTE — Progress Notes (Signed)
   [facility] Noralee Chars, MD Phone: [phone]  Reason For Visit: SDA for   URI  Has been sick for 4 days, however improving.  Patient states he's had a cough and congestion. Patient has been getting plenty of rest and fluids. Patient states that he's starting to feel better today. 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 Denies having any fevers. Denies any muscle aches. Medications tried: Has been using DayQuil and NyQuil Sick contacts: yes, works at the hospital   Symptoms Fever:None  Muscle aches: None  Shortness of breath: None  Rash: None  Sore throat or swollen glands: None   ROS see HPI Smoking Status noted  Objective: BP 108/62   Pulse 93   Temp 98.1 F (36.7 C) (Oral)   Ht 5\' 9"  (1.753 m)   Wt 170 lb 6.4 oz (77.3 kg)   SpO2 99%   BMI 25.16 kg/m  Gen: NAD, alert, cooperative with exam HEENT: Normal    Neck: Spotty lymphadenopathy     Nose: nasal turbinates with congestion     Throat: moist mucus membranes, no erythema Cardio: regular rate and rhythm, S1S2 heard, no murmurs appreciated Pulm: clear to auscultation bilaterally, no wheezes, rhonchi or rales Skin: dry, intact, no rashes or lesions  Assessment/Plan: See problem based a/p  Viral upper respiratory infection Patient with viral URI, resolving. -Continue symptomatic treatment -Continue rest and fluids as needed -As patient is not febrile and is feeling better can return back to work

## 2016-12-16 NOTE — Patient Instructions (Addendum)
Can return to work tomorrow. Continue DayQuil etc. Will give you a prescription for tessalon perles. Follow up with [place] as needed.

## 2016-12-18 ENCOUNTER — Encounter: Payer: Self-pay | Admitting: Family Medicine

## 2016-12-18 ENCOUNTER — Ambulatory Visit (INDEPENDENT_AMBULATORY_CARE_PROVIDER_SITE_OTHER): Payer: 59 | Admitting: Family Medicine

## 2016-12-18 VITALS — BP 120/75 | HR 86 | Temp 98.0°F | Ht 69.0 in | Wt 162.0 lb

## 2016-12-18 DIAGNOSIS — M549 Dorsalgia, unspecified: Secondary | ICD-10-CM

## 2016-12-18 MED ORDER — METHYLPREDNISOLONE ACETATE 40 MG/ML IJ SUSP
40.0000 mg | Freq: Once | INTRAMUSCULAR | Status: AC
  Administered 2016-12-18: 40 mg via INTRAMUSCULAR

## 2016-12-18 MED ORDER — KETOROLAC TROMETHAMINE 30 MG/ML IJ SOLN
30.0000 mg | Freq: Once | INTRAMUSCULAR | Status: AC
  Administered 2016-12-18: 30 mg via INTRAMUSCULAR

## 2016-12-18 NOTE — Patient Instructions (Signed)
Thank you so much for coming to visit today! I suspect your back pain is coming from the cough. We will give you a shot of Toradol and steroids today. You may start taking Ibuprofen and Tylenol together starting tomorrow. Please continue your current cough medications, however you may add honey as needed for cough. If you develop numbness, tingling, or weakness or worsening back pain, please let us know.  Dr. Caroleen Hamman

## 2016-12-20 ENCOUNTER — Encounter: Payer: Self-pay | Admitting: Family Medicine

## 2016-12-20 DIAGNOSIS — K5900 Constipation, unspecified: Secondary | ICD-10-CM | POA: Diagnosis not present

## 2016-12-20 DIAGNOSIS — S339XXA Sprain of unspecified parts of lumbar spine and pelvis, initial encounter: Secondary | ICD-10-CM | POA: Diagnosis not present

## 2016-12-20 DIAGNOSIS — M545 Low back pain: Secondary | ICD-10-CM | POA: Diagnosis not present

## 2016-12-21 ENCOUNTER — Telehealth: Payer: Self-pay

## 2016-12-21 DIAGNOSIS — G8929 Other chronic pain: Secondary | ICD-10-CM | POA: Diagnosis not present

## 2016-12-21 DIAGNOSIS — M545 Low back pain: Secondary | ICD-10-CM | POA: Diagnosis not present

## 2016-12-21 DIAGNOSIS — M47816 Spondylosis without myelopathy or radiculopathy, lumbar region: Secondary | ICD-10-CM | POA: Diagnosis not present

## 2016-12-21 MED FILL — traMADol HCL 50 MG TABS: 50 | 3 days supply | Qty: 10 | Fill #0

## 2016-12-21 MED FILL — predniSONE 10 MG TABS: 10 | 6 days supply | Qty: 21 | Fill #0

## 2016-12-21 NOTE — Telephone Encounter (Signed)
Patient walked in. States that he is having back trouble. Saw Dr Ethelene Hal at [facility]. Dr Ethelene Hal ordered an MRI. Patient has a loop recorder.  Inquired if he could have a MRI? Per [facility], patient can have MRI. Loop recorder is in fact compatible. Recommended that patient download his loop recorder before his appointment for MRI.

## 2016-12-23 NOTE — Telephone Encounter (Signed)
Pt called back about the my chart message sent Sunday.  He says his pain level is 4-5 all the time. It can be a 10 at any point during the day. He was given tramadol but a limited supply.  He has a MRI next Wed.  He would to get something else for the pain. Please advise

## 2016-12-23 NOTE — Telephone Encounter (Addendum)
[phone] (H)  Called him back still hurting.    Went to [facility] on Sunday Xray showed l4 displaced.  Needed an MRI Saw Dr Ethelene Hal on Monday -  Ortho getting MRI set up He wondered if we could give him a work excuse till has MRI I recommend he contact Dr Ethelene Hal since he saw him most recently

## 2016-12-26 NOTE — Progress Notes (Signed)
Carelink summary report received. Battery status OK. Normal device function. No new symptom episodes or brady episodes. 6 tachy- 2 w/ ECGs appear AF w/ RVR. 7 pause- avail. ECGs appear 3-4 sec's nocturnal pauses. AF 100% ASA 81mg  CHADSvasc 1 per note. Monthly summary reports and ROV/PRN

## 2016-12-30 DIAGNOSIS — G8929 Other chronic pain: Secondary | ICD-10-CM | POA: Diagnosis not present

## 2016-12-30 DIAGNOSIS — M545 Low back pain: Secondary | ICD-10-CM | POA: Diagnosis not present

## 2017-01-01 ENCOUNTER — Ambulatory Visit
Admission: RE | Admit: 2017-01-01 | Discharge: 2017-01-01 | Disposition: A | Discharge status: Home / Self Care | Payer: 59 | Source: Ambulatory Visit | Attending: Family Medicine | Admitting: Family Medicine

## 2017-01-01 DIAGNOSIS — S32009D Unspecified fracture of unspecified lumbar vertebra, subsequent encounter for fracture with routine healing: Secondary | ICD-10-CM

## 2017-01-01 DIAGNOSIS — M8589 Other specified disorders of bone density and structure, multiple sites: Secondary | ICD-10-CM | POA: Diagnosis not present

## 2017-01-04 DIAGNOSIS — M4850XD Collapsed vertebra, not elsewhere classified, site unspecified, subsequent encounter for fracture with routine healing: Secondary | ICD-10-CM | POA: Diagnosis not present

## 2017-01-05 LAB — CUP PACEART REMOTE DEVICE CHECK
Date Time Interrogation Session: 20180129232245
Implantable Pulse Generator Implant Date: 20170404

## 2017-01-05 NOTE — Progress Notes (Signed)
Carelink summary report received. Battery status OK. Normal device function. No new symptom episodes or brady  episodes. AF 100% +ASA 81mg , CHADSVasc 1 per note. 2 tachy- appear AF w/ RVR. 16 nocturnal pauses. No symptoms reported per EPIC.  Monthly summary reports and ROV/PRN

## 2017-01-06 ENCOUNTER — Ambulatory Visit (INDEPENDENT_AMBULATORY_CARE_PROVIDER_SITE_OTHER): Payer: 59 | Admitting: *Deleted

## 2017-01-06 ENCOUNTER — Other Ambulatory Visit (HOSPITAL_COMMUNITY): Payer: Self-pay | Admitting: Interventional Radiology

## 2017-01-06 ENCOUNTER — Telehealth (HOSPITAL_COMMUNITY): Payer: Self-pay

## 2017-01-06 DIAGNOSIS — M4850XS Collapsed vertebra, not elsewhere classified, site unspecified, sequela of fracture: Principal | ICD-10-CM

## 2017-01-06 DIAGNOSIS — R55 Syncope and collapse: Secondary | ICD-10-CM | POA: Diagnosis not present

## 2017-01-06 DIAGNOSIS — IMO0001 Reserved for inherently not codable concepts without codable children: Secondary | ICD-10-CM

## 2017-01-06 NOTE — Telephone Encounter (Signed)
Called to schedule consult with Dr. Deveshwar, left message for pt to return call. AW 

## 2017-01-07 NOTE — Progress Notes (Signed)
Carelink Summary Report / Loop Recorder 

## 2017-01-14 DIAGNOSIS — I10 Essential (primary) hypertension: Secondary | ICD-10-CM | POA: Diagnosis not present

## 2017-01-14 DIAGNOSIS — Z125 Encounter for screening for malignant neoplasm of prostate: Secondary | ICD-10-CM | POA: Diagnosis not present

## 2017-01-20 DIAGNOSIS — I10 Essential (primary) hypertension: Secondary | ICD-10-CM | POA: Diagnosis not present

## 2017-01-20 DIAGNOSIS — I482 Chronic atrial fibrillation: Secondary | ICD-10-CM | POA: Diagnosis not present

## 2017-01-20 DIAGNOSIS — N529 Male erectile dysfunction, unspecified: Secondary | ICD-10-CM | POA: Diagnosis not present

## 2017-01-20 DIAGNOSIS — G4733 Obstructive sleep apnea (adult) (pediatric): Secondary | ICD-10-CM | POA: Diagnosis not present

## 2017-01-21 ENCOUNTER — Ambulatory Visit (HOSPITAL_COMMUNITY)
Admission: RE | Admit: 2017-01-21 | Discharge: 2017-01-21 | Disposition: A | Discharge status: Home / Self Care | Payer: 59 | Source: Ambulatory Visit | Attending: Interventional Radiology | Admitting: Interventional Radiology

## 2017-01-21 DIAGNOSIS — M4850XS Collapsed vertebra, not elsewhere classified, site unspecified, sequela of fracture: Principal | ICD-10-CM

## 2017-01-21 DIAGNOSIS — IMO0001 Reserved for inherently not codable concepts without codable children: Secondary | ICD-10-CM

## 2017-01-21 HISTORY — PX: IR GENERIC HISTORICAL: IMG1180011

## 2017-01-22 ENCOUNTER — Encounter (HOSPITAL_COMMUNITY): Payer: Self-pay | Admitting: Interventional Radiology

## 2017-01-22 LAB — CUP PACEART REMOTE DEVICE CHECK
Date Time Interrogation Session: 20180228224143
MDC IDC PG IMPLANT DT: 20170404

## 2017-01-27 ENCOUNTER — Encounter: Payer: Self-pay | Admitting: Family Medicine

## 2017-01-27 ENCOUNTER — Ambulatory Visit (INDEPENDENT_AMBULATORY_CARE_PROVIDER_SITE_OTHER): Payer: 59 | Admitting: Family Medicine

## 2017-01-27 VITALS — BP 100/60 | HR 89 | Temp 98.1°F | Ht 69.0 in | Wt 164.0 lb

## 2017-01-27 DIAGNOSIS — I10 Essential (primary) hypertension: Secondary | ICD-10-CM | POA: Diagnosis not present

## 2017-01-27 DIAGNOSIS — Z125 Encounter for screening for malignant neoplasm of prostate: Secondary | ICD-10-CM

## 2017-01-27 DIAGNOSIS — M8080XA Other osteoporosis with current pathological fracture, unspecified site, initial encounter for fracture: Secondary | ICD-10-CM | POA: Diagnosis not present

## 2017-01-27 DIAGNOSIS — M810 Age-related osteoporosis without current pathological fracture: Secondary | ICD-10-CM | POA: Insufficient documentation

## 2017-01-27 NOTE — Assessment & Plan Note (Signed)
Given his severe osteopenia on bone scan and 2 vetebral fracture I think he has osteoporosis.  Will check labs.  If ok will start bisphosphonate

## 2017-01-27 NOTE — Assessment & Plan Note (Signed)
BP Readings from Last 3 Encounters:  01/27/17 100/60  12/18/16 120/75  12/16/16 108/62   At goal -  feels well

## 2017-01-27 NOTE — Patient Instructions (Addendum)
Good to see you today!  Thanks for coming in.  For the bones you should  Take calcium 1200 mg a day + 800 units of Vit D every day   I will call you if your tests are not good.  Otherwise I will send you a letter.  If you do not hear from me with in 2 weeks please call our office.     I would do regular walking and very light weight lifting as your pain allows.

## 2017-01-27 NOTE — Progress Notes (Signed)
Subjective  Patient is presenting with the following illnesses  Feels overall well except for back pain from recent vertebral fracture when sneezed.  This is his second back fracture.  A recent bone scan showed osteopenia very close to osteoporosis.  Patient reports no  vision/ hearing changes,anorexia, weight change, fever ,adenopathy, persistant / recurrent hoarseness, swallowing issues, chest pain, edema,persistant / recurrent cough, hemoptysis, dyspnea(rest, exertional, paroxysmal nocturnal), gastrointestinal  bleeding (melena, rectal bleeding), abdominal pain, excessive heart burn, GU symptoms(dysuria, hematuria, pyuria, voiding/incontinence  Issues)  focal weakness, severe memory loss, concerning skin lesions, depression, anxiety, abnormal bruising/bleeding, major joint swelling.      Chief Complaint noted Review of Symptoms - see HPI PMH - Smoking status noted.     Objective Vital Signs reviewed Neck:  No deformities, thyromegaly, masses, or tenderness noted.   Supple with full range of motion without pain. Heart - Regular rate and rhythm.  No murmurs, gallops or rubs.    Lungs:  Normal respiratory effort, chest expands symmetrically. Lungs are clear to auscultation, no crackles or wheezes. Abdomen: soft and non-tender without masses, organomegaly or hernias noted.  No guarding or rebound Extremities:  No cyanosis, edema, or deformity noted with good range of motion of all major joints.   GU - testes normal size no masses Endocrine - normal muscle mass and hair growth     Assessments/Plans  No problem-specific Assessment & Plan notes found for this encounter.   See Encounter view if individual problem A/Ps not visible See after visit summary for details of patient instuctions

## 2017-01-28 LAB — VITAMIN D 25 HYDROXY (VIT D DEFICIENCY, FRACTURES): Vit D, 25-Hydroxy: 33.6 ng/mL (ref 30.0–100.0)

## 2017-01-28 LAB — COMPREHENSIVE METABOLIC PANEL
A/G RATIO: 1.7 (ref 1.2–2.2)
ALBUMIN: 4.5 g/dL (ref 3.6–4.8)
ALK PHOS: 79 IU/L (ref 39–117)
ALT: 18 IU/L (ref 0–44)
AST: 22 IU/L (ref 0–40)
BUN / CREAT RATIO: 21 (ref 10–24)
BUN: 20 mg/dL (ref 8–27)
Bilirubin Total: 1.1 mg/dL (ref 0.0–1.2)
CALCIUM: 9.6 mg/dL (ref 8.6–10.2)
CO2: 23 mmol/L (ref 18–29)
Chloride: 102 mmol/L (ref 96–106)
Creatinine, Ser: 0.97 mg/dL (ref 0.76–1.27)
GFR calc Af Amer: 96 mL/min/{1.73_m2} (ref >59)
GFR, EST NON AFRICAN AMERICAN: 83 mL/min/{1.73_m2} (ref >59)
GLOBULIN, TOTAL: 2.6 g/dL (ref 1.5–4.5)
Glucose: 95 mg/dL (ref 65–99)
POTASSIUM: 4.5 mmol/L (ref 3.5–5.2)
SODIUM: 140 mmol/L (ref 134–144)
Total Protein: 7.1 g/dL (ref 6.0–8.5)

## 2017-01-28 LAB — TESTOSTERONE: Testosterone: 482 ng/dL (ref 264–916)

## 2017-01-28 LAB — PSA: Prostate Specific Ag, Serum: 0.7 ng/mL (ref 0.0–4.0)

## 2017-01-28 LAB — PHOSPHORUS: Phosphorus: 3.2 mg/dL (ref 2.5–4.5)

## 2017-02-01 ENCOUNTER — Other Ambulatory Visit: Payer: Self-pay | Admitting: Family Medicine

## 2017-02-01 ENCOUNTER — Encounter: Payer: Self-pay | Admitting: Family Medicine

## 2017-02-01 MED ORDER — ALENDRONATE SODIUM 70 MG PO TABS: 70.0000 mg | ORAL_TABLET | ORAL | 3 refills | Status: DC

## 2017-02-01 MED FILL — ALENDRONATE NA 70 MG TAB: 70 | 84 days supply | Qty: 12 | Fill #0

## 2017-02-02 DIAGNOSIS — H53041 Amblyopia suspect, right eye: Secondary | ICD-10-CM | POA: Diagnosis not present

## 2017-02-02 DIAGNOSIS — H524 Presbyopia: Secondary | ICD-10-CM | POA: Diagnosis not present

## 2017-02-03 NOTE — Progress Notes (Signed)
Subjective:     Patient ID: Wayne Molina, male   DOB: Jan 07, 1956, 61 y.o.   MRN: [id number]  HPI Mr. Lownes is a 61yo male presenting today for back pain. Notes cough for several weeks with back pain only presenting a few days ago. Denies weakness, numbness, and radiation down into legs. Denies fecal and urinary incontinence. Denies fever.  Pain is located over left back. Has been using cough medications with some relief. Patient suspects pain is from cough, but wanted to make sure.  Review of Systems Per HPI    Objective:   Physical Exam  Constitutional: He appears well-developed and well-nourished. No distress.  Cardiovascular: Normal rate and regular rhythm.   No murmur heard. Pulmonary/Chest: Effort normal. No respiratory distress. He has no wheezes.  Musculoskeletal:  Muscle strength 5/5 in upper and lower extremities. Negative straight leg raise. Small tenderness over left paraspinal muscles, no midline tenderness.  Neurological:  Sensation intact.  Psychiatric: He has a normal mood and affect. His behavior is normal.      Assessment and Plan:     1. Acute bilateral back pain, unspecified back location Suspect musculoskeletal etiology secondary to cough. Shot of Toradol and Depo Medrol given in clinic. May use Tylenol and Ibuprofen starting tomorrow as needed for pain. Return precautions given. Return if no improvement.

## 2017-02-05 ENCOUNTER — Ambulatory Visit (INDEPENDENT_AMBULATORY_CARE_PROVIDER_SITE_OTHER): Payer: 59 | Admitting: *Deleted

## 2017-02-05 DIAGNOSIS — R55 Syncope and collapse: Secondary | ICD-10-CM | POA: Diagnosis not present

## 2017-02-08 ENCOUNTER — Encounter: Payer: Self-pay | Admitting: Cardiovascular Disease

## 2017-02-08 NOTE — Progress Notes (Signed)
Carelink Summary Report / Loop Recorder 

## 2017-02-16 ENCOUNTER — Other Ambulatory Visit: Payer: Self-pay | Admitting: Cardiovascular Disease

## 2017-02-16 MED FILL — LOSARTAN POTASSIUM 50 MG TA: 50 | 90 days supply | Qty: 180 | Fill #0

## 2017-02-16 NOTE — Telephone Encounter (Signed)
NEEDS TO SCHEDULE OV 

## 2017-02-18 LAB — CUP PACEART REMOTE DEVICE CHECK
Implantable Pulse Generator Implant Date: 20170404
MDC IDC SESS DTM: 20180330223906

## 2017-02-24 DIAGNOSIS — H5203 Hypermetropia, bilateral: Secondary | ICD-10-CM | POA: Diagnosis not present

## 2017-03-08 ENCOUNTER — Ambulatory Visit (INDEPENDENT_AMBULATORY_CARE_PROVIDER_SITE_OTHER): Payer: 59 | Admitting: *Deleted

## 2017-03-08 DIAGNOSIS — R55 Syncope and collapse: Secondary | ICD-10-CM | POA: Diagnosis not present

## 2017-03-08 NOTE — Progress Notes (Signed)
Carelink Summary Report / Loop Recorder 

## 2017-03-21 LAB — CUP PACEART REMOTE DEVICE CHECK
Date Time Interrogation Session: 20180429233616
MDC IDC PG IMPLANT DT: 20170404

## 2017-03-21 NOTE — Progress Notes (Signed)
Carelink summary report received. Battery status OK. Normal device function. No new symptom episodes or brady episodes. AF 100% +ASA 81 mg, CHADSVASC 1 per EPIC. 35 tachy- available ECGs appear AF w/ RVR. 5 pauses- 4 w/ ECGs appear nocturnal pauses, no symptoms reported per EPIC, longest 3 sec's. Monthly summary reports and ROV/PRN

## 2017-03-30 ENCOUNTER — Telehealth: Payer: Self-pay | Admitting: *Deleted

## 2017-03-30 ENCOUNTER — Encounter: Payer: Self-pay | Admitting: Cardiovascular Disease

## 2017-03-30 NOTE — Telephone Encounter (Signed)
[name] regarding symptoms associated with pause episode and tachy episodes. 3 second pause episode on Mar 29, 2017 at 23:45. 23 brief tachy episodes, ECG appears AFib RVR. Gave Device Clinic phone number to call back.

## 2017-03-31 NOTE — Telephone Encounter (Signed)
Spoke with Patient regarding symptoms associated with pause episodes and tachy episodes. 3 second pause on May 21 at 23:45 and May 22 at 4:57, patient states asymptomatic and he goes to sleep around midnight to 5:30-6-30. Tachy episodes appear AFib RVR. Patient states last week (May 11-18) he had some SOB of and on with exertion, but he has not been SOB this week. Patient states he does not wear his CPAP at night and he takes his medications as prescribed. Advised patient to use symptom activator if signs/symtoms of syncope or presyncope. Will review episodes with Dr. Royann Shivers and if further recommendations we will call back. Patient verbalized understanding. Reminded patient of F/U with Dr. Royann Shivers 05/20/17.

## 2017-04-06 ENCOUNTER — Ambulatory Visit (INDEPENDENT_AMBULATORY_CARE_PROVIDER_SITE_OTHER): Payer: 59 | Admitting: *Deleted

## 2017-04-06 DIAGNOSIS — R55 Syncope and collapse: Secondary | ICD-10-CM | POA: Diagnosis not present

## 2017-04-08 LAB — CUP PACEART REMOTE DEVICE CHECK
Date Time Interrogation Session: 20180529233919
MDC IDC PG IMPLANT DT: 20170404

## 2017-04-08 NOTE — Progress Notes (Signed)
Carelink Summary Report 

## 2017-04-12 MED FILL — ALENDRONATE NA 70 MG TAB: 70 | 84 days supply | Qty: 12 | Fill #1

## 2017-05-06 ENCOUNTER — Ambulatory Visit (INDEPENDENT_AMBULATORY_CARE_PROVIDER_SITE_OTHER): Payer: 59 | Admitting: *Deleted

## 2017-05-06 DIAGNOSIS — R55 Syncope and collapse: Secondary | ICD-10-CM

## 2017-05-07 NOTE — Progress Notes (Signed)
Carelink Summary Report / Loop Recorder 

## 2017-05-17 LAB — CUP PACEART REMOTE DEVICE CHECK
Date Time Interrogation Session: 20180628234312
Implantable Pulse Generator Implant Date: 20170404

## 2017-05-20 ENCOUNTER — Encounter: Payer: Self-pay | Admitting: Cardiovascular Disease

## 2017-05-20 ENCOUNTER — Ambulatory Visit (INDEPENDENT_AMBULATORY_CARE_PROVIDER_SITE_OTHER): Payer: 59 | Admitting: Cardiovascular Disease

## 2017-05-20 VITALS — BP 126/80 | HR 71 | Ht 69.0 in | Wt 161.0 lb

## 2017-05-20 DIAGNOSIS — I482 Chronic atrial fibrillation, unspecified: Secondary | ICD-10-CM

## 2017-05-20 DIAGNOSIS — G4733 Obstructive sleep apnea (adult) (pediatric): Secondary | ICD-10-CM

## 2017-05-20 DIAGNOSIS — I10 Essential (primary) hypertension: Secondary | ICD-10-CM | POA: Diagnosis not present

## 2017-05-20 DIAGNOSIS — R55 Syncope and collapse: Secondary | ICD-10-CM | POA: Diagnosis not present

## 2017-05-20 DIAGNOSIS — I428 Other cardiomyopathies: Secondary | ICD-10-CM | POA: Diagnosis not present

## 2017-05-20 NOTE — Patient Instructions (Signed)
Dr Croitoru recommends that you schedule a follow-up appointment in 12 months. You will receive a reminder letter in the mail two months in advance. If you don't receive a letter, please call our office to schedule the follow-up appointment.  If you need a refill on your cardiac medications before your next appointment, please call your pharmacy. 

## 2017-05-20 NOTE — Progress Notes (Signed)
Patient ID: Wayne Molina, male   DOB: July 09, 1956, 61 y.o.   MRN: [id number]    Cardiology Office Note    Date:  05/21/2017   ID:  Wayne Molina, DOB 02-07-1956, MRN [id number]  PCP:  Carney Living, MD  Cardiologist:   Thurmon Fair, MD   Chief Complaint  Patient presents with  . Follow-up    History of Present Illness:  Wayne Molina is a 61 y.o. male with long-standing permanent atrial fibrillation with spontaneous controlled ventricular response and a single episode of syncope that occurred roughly 16 months ago. The loop recorder was implanted in April 2017 and has not shown an abnormality that could be clearly associated with syncope. He has a relatively frequent episodes of 3 second pauses during atrial fibrillation, but these are consistently nocturnal and always asymptomatic; longer pauses have not been seen.  He has sleep apnea but has been unable to tolerate multiple shapes and sizes of CPAP masks. We have discussed a jaw advancement device, but he wears dentures.  He feels great. He continues to exercise on a regular basis and is very fit. He denies exertional angina or dyspnea. He is unaware of palpitations. He has not had episodes of dizziness or syncope since his last appointment. He denies leg edema, easy bleeding or bruising, focal neurological events, intermittent claudication, erectile dysfunction.  Wayne Molina has permanent atrial fibrillation with spontaneously controlled ventricular response. He has long had an abnormal ECG suggestive of hypertrophic cardiomyopathy but without evidence of any structural abnormalities of the left ventricle by conventional imaging methods. He has biatrial dilatation by echocardiography. He has normal left ventricular systolic function. He has mild to moderate mitral and tricuspid insufficiency, unlikely to have a causal relationship with his atrial fibrillation. He has previously been diagnosed with sleep apnea but has been unable to tolerate  CPAP. He does not have any symptoms of hypersomnolence. He has not had any focal neurological events and has never had embolic complications from his chronic atrial fibrillation. He has normal left ventricular systolic function and had a normal nuclear stress test in the past.  Past Medical History:  Diagnosis Date  . Anxiety   . Arthritis    fingers  . Chronic atrial fibrillation (HCC)   . Depression   . Family history of malignant neoplasm of gastrointestinal tract   . Hyperlipidemia   . Hypertension   . Personal history of colonic polyps 11/12/2006  . Sleep apnea     Past Surgical History:  Procedure Laterality Date  . 2-D echocardiogram  07/22/2010   Ejection fraction greater than 55%. Left atrium moderately dilated. Right atrium moderately dilated. 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 Mild to Moderate MR. Mild to moderate TR.  . ATRIAL ABLATION SURGERY  2004  . EP IMPLANTABLE DEVICE N/A 02/11/2016   Procedure: Loop Recorder Insertion;  Surgeon: Thurmon Fair, MD;  Location: [facility];  Service: Cardiovascular;  Laterality: N/A;  . Exercise Myoview stress test  07/08/2000   Nonischemic low-risk.  Wayne Molina HIP FRACTURE SURGERY  1970s  . IR GENERIC HISTORICAL  01/21/2017   IR RADIOLOGIST EVAL & MGMT 01/21/2017 [facility] RAD    Outpatient Medications Prior to Visit  Medication Sig Dispense Refill  . alendronate (FOSAMAX) 70 MG tablet Take 1 tablet (70 mg total) by mouth every 7 (seven) days. Take with a full glass of water on an empty stomach. 12 tablet 3  . aspirin 81 MG tablet Take 81 mg by mouth daily.      Wayne Molina  ibuprofen (ADVIL,MOTRIN) 800 MG tablet Take 800 mg by mouth every 8 (eight) hours as needed for mild pain or moderate pain.    Wayne Molina losartan (COZAAR) 50 MG tablet Take 2 tablets (100 mg total) by mouth daily. NEEDS TO SCHEDULE APPT 180 tablet 5  . Magnesium 250 MG TABS Take 1 tablet by mouth daily.    . MULTIPLE VITAMIN PO Take 1 tablet by mouth daily.    . potassium gluconate 595  (99 K) MG TABS tablet Take 595 mg by mouth daily.    . Probiotic Product (PROBIOTIC DAILY PO) Take 46 mg by mouth. [employer] probiotic 10    . sildenafil (VIAGRA) 50 MG tablet Take 1 tablet (50 mg total) by mouth daily as needed for erectile dysfunction. 10 tablet 2   No facility-administered medications prior to visit.      Allergies:   Patient has no known allergies.   Social History   Social History  . Marital status: Divorced    Spouse name: N/A  . Number of children: N/A  . Years of education: N/A   Social History Main Topics  . Smoking status: Never Smoker  . Smokeless tobacco: Never Used  . Alcohol use No  . Drug use: No  . Sexual activity: Yes   Other Topics Concern  . None   Social History Narrative   Single has girlfriend.  Works at [employer] History:  The patient's family history includes Colon cancer in his maternal uncle and other; Heart disease in his father; Stomach cancer in his unknown relative.   ROS:   Please see the history of present illness.    ROS All other systems reviewed and are negative.   PHYSICAL EXAM:   VS:  BP 126/80   Pulse 71   Ht 5\' 9"  (1.753 m)   Wt 161 lb (73 kg)   BMI 23.78 kg/m    GEN: Well nourished, well developed, in no acute distress  HEENT: normal  Neck: no JVD, carotid bruits, or masses Cardiac: Irregular,  no murmurs, rubs, or gallops,no edema  Respiratory:  clear to auscultation bilaterally, normal work of breathing GI: soft, nontender, nondistended, + BS MS: no deformity or atrophy  Skin: warm and dry, no rash Neuro:  Alert and Oriented x 3, Strength and sensation are intact Psych: euthymic mood, full affect  Wt Readings from Last 3 Encounters:  05/20/17 161 lb (73 kg)  01/27/17 164 lb (74.4 kg)  12/18/16 162 lb (73.5 kg)      Studies/Labs Reviewed:   EKG:  EKG is ordered today.  The ekg ordered today demonstrates a pattern very similar to previous tracings with atrial fibrillation with  spontaneously controlled ventricular rates and prominent anterolateral T-wave inversions. The QTc interval remains normal  Recent Labs: 01/27/2017: ALT 18; BUN 20; Creatinine, Ser 0.97; Potassium 4.5; Sodium 140   Lipid Panel    Component Value Date/Time   CHOL 195 12/06/2013 1112   TRIG 70 12/06/2013 1112   HDL 47 12/06/2013 1112   CHOLHDL 4.1 12/06/2013 1112   VLDL 14 12/06/2013 1112   LDLCALC 134 (H) 12/06/2013 1112    Additional studies/ records that were reviewed today include:  Records from emergency room evaluation 15, [facility]    ASSESSMENT:    1. Syncope, unspecified syncope type   2. Chronic atrial fibrillation (HCC)   3. Other cardiomyopathy (HCC)   4. Obstructive sleep apnea syndrome   5. Essential hypertension  PLAN:  In order of problems listed above:  1. Syncope: Etiology of this remains unclear, but it appears to be hemodynamically mediated, Possibly vasovagal syncope. No diagnostic arrhythmia by Loop recorder monitoring in almost 1-1/2 years.  2. A. Fib: Not requiring rate control medications, suggesting that he has significant AV node disease. We have discussed in the past that this might eventually need pacemaker implantation. He remains at low embolic risk, CHADSVasc score 1, on aspirin. In a couple of years, when he turns 39 he will officially meet criteria for full anticoagulation. 3. Cardiomyopathy: His EKG suggests apical variant hypertrophic cardiomyopathy, but his echo has never found convincing evidence of this diagnosis. His EKG has been quite abnormal for years. Need to keep in mind the potential risk of ventricular arrhythmias as well. 4. OSA: Not sure if this has any connection to the syncopal event, but it is likely responsible for his nocturnal pauses of up to 3 seconds. He does not have symptoms of daytime hypersomnolence. He exercises at the gym without any complaints of dyspnea.  5. HTN: Well controlled.    Medication Adjustments/Labs and  Tests Ordered: Current medicines are reviewed at length with the patient today.  Concerns regarding medicines are outlined above.  Medication changes, Labs and Tests ordered today are listed in the Patient Instructions below. Patient Instructions  Dr Royann Shivers recommends that you schedule a follow-up appointment in 12 months. You will receive a reminder letter in the mail two months in advance. If you don't receive a letter, please call our office to schedule the follow-up appointment.  If you need a refill on your cardiac medications before your next appointment, please call your pharmacy.      Signed, Thurmon Fair, MD  05/21/2017 3:47 PM    [facility] [address] [place], [place], [place]  [address] Phone: [phone]; Fax: [phone]

## 2017-05-24 MED FILL — LOSARTAN POTASSIUM 50 MG TA: 50 | 90 days supply | Qty: 180 | Fill #1

## 2017-06-07 ENCOUNTER — Ambulatory Visit (INDEPENDENT_AMBULATORY_CARE_PROVIDER_SITE_OTHER): Payer: 59 | Admitting: *Deleted

## 2017-06-07 DIAGNOSIS — R55 Syncope and collapse: Secondary | ICD-10-CM | POA: Diagnosis not present

## 2017-06-08 NOTE — Progress Notes (Signed)
Carelink Summary Report / Loop Recorder 

## 2017-06-17 LAB — CUP PACEART REMOTE DEVICE CHECK
Implantable Pulse Generator Implant Date: 20170404
MDC IDC SESS DTM: 20180729001403

## 2017-06-17 NOTE — Progress Notes (Signed)
Carelink summary report received. Battery status OK. Normal device function. No new symptom episodes or brady episodes. AF 100% 4 tachy- 1 w/ ECG appear AF w/ RVR. 11 pauses- all ECGs appear true pauses. Longest pause 4 sec's, all nocturnal. No symptoms reported per EPIC. Monthly summary reports and ROV/PRN

## 2017-06-29 ENCOUNTER — Telehealth: Payer: Self-pay | Admitting: *Deleted

## 2017-06-29 NOTE — Telephone Encounter (Signed)
LMOM requesting manual transmission from Carelink monitor.  Gave direct phone number for questions/concerns.

## 2017-06-29 NOTE — Telephone Encounter (Signed)
Manual transmission received.  All available pause episodes are nocturnal, tachy episodes are short duration (<55min).  No symptom episodes noted on device.

## 2017-06-30 MED FILL — ALENDRONATE NA 70 MG TAB: 70 | 84 days supply | Qty: 12 | Fill #2

## 2017-07-05 ENCOUNTER — Ambulatory Visit (INDEPENDENT_AMBULATORY_CARE_PROVIDER_SITE_OTHER): Payer: 59 | Admitting: *Deleted

## 2017-07-05 DIAGNOSIS — R55 Syncope and collapse: Secondary | ICD-10-CM | POA: Diagnosis not present

## 2017-07-06 NOTE — Progress Notes (Signed)
Carelink Summary Report / Loop Recorder 

## 2017-07-09 LAB — CUP PACEART REMOTE DEVICE CHECK
Date Time Interrogation Session: 20180828003928
Implantable Pulse Generator Implant Date: 20170404

## 2017-08-04 ENCOUNTER — Ambulatory Visit: Payer: 59 | Admitting: *Deleted

## 2017-08-04 DIAGNOSIS — R55 Syncope and collapse: Secondary | ICD-10-CM | POA: Diagnosis not present

## 2017-08-05 NOTE — Progress Notes (Signed)
Carelink Summary Report / Loop Recorder 

## 2017-08-11 ENCOUNTER — Telehealth: Payer: Self-pay | Admitting: *Deleted

## 2017-08-11 NOTE — Telephone Encounter (Signed)
Left Detailed Message on home phone (DPR) regarding any symptoms associated with 44 tachy episodes-- longest 2 minutes, Max V rate 200 bpm; and 13 pause episodes-- all nocturnal. Gave device Clinic number to call back regarding symptoms.

## 2017-08-18 ENCOUNTER — Telehealth: Payer: Self-pay | Admitting: Cardiology

## 2017-08-18 NOTE — Telephone Encounter (Signed)
[name] requesting that pt send manual transmission b/c home monitor has not updated in at least 14 days.    

## 2017-08-19 NOTE — Telephone Encounter (Signed)
[name] requesting call back to the Device Clinic. 

## 2017-08-24 MED FILL — LOSARTAN POTASSIUM 50 MG TA: 50 | 90 days supply | Qty: 180 | Fill #2

## 2017-08-27 NOTE — Telephone Encounter (Signed)
Left Detailed Message on home phone (DPR) regarding any symptoms associated with tachy and pause episodes. Gave device clinic phone number to call back.

## 2017-09-03 ENCOUNTER — Ambulatory Visit (INDEPENDENT_AMBULATORY_CARE_PROVIDER_SITE_OTHER): Payer: 59 | Admitting: *Deleted

## 2017-09-03 DIAGNOSIS — R55 Syncope and collapse: Secondary | ICD-10-CM

## 2017-09-06 NOTE — Progress Notes (Signed)
Carelink Summary Report / Loop Recorder 

## 2017-09-07 LAB — CUP PACEART REMOTE DEVICE CHECK
Date Time Interrogation Session: 20181027014216
Implantable Pulse Generator Implant Date: 20170404

## 2017-10-04 ENCOUNTER — Ambulatory Visit (INDEPENDENT_AMBULATORY_CARE_PROVIDER_SITE_OTHER): Payer: 59 | Admitting: *Deleted

## 2017-10-04 DIAGNOSIS — R55 Syncope and collapse: Secondary | ICD-10-CM

## 2017-10-05 NOTE — Progress Notes (Signed)
Carelink Summary Report / Loop Recorder 

## 2017-10-11 MED FILL — ALENDRONATE NA 70 MG TAB: 70 | 84 days supply | Qty: 12 | Fill #3

## 2017-10-14 ENCOUNTER — Other Ambulatory Visit: Payer: Self-pay

## 2017-10-18 LAB — CUP PACEART REMOTE DEVICE CHECK
Date Time Interrogation Session: 20181126023947
Implantable Pulse Generator Implant Date: 20170404

## 2017-11-03 ENCOUNTER — Ambulatory Visit (INDEPENDENT_AMBULATORY_CARE_PROVIDER_SITE_OTHER): Payer: 59 | Admitting: *Deleted

## 2017-11-03 DIAGNOSIS — R55 Syncope and collapse: Secondary | ICD-10-CM | POA: Diagnosis not present

## 2017-11-04 NOTE — Progress Notes (Signed)
Carelink Summary Report / Loop Recorder 

## 2017-11-14 LAB — CUP PACEART REMOTE DEVICE CHECK
MDC IDC PG IMPLANT DT: 20170404
MDC IDC SESS DTM: 20181226023924

## 2017-11-24 MED FILL — LOSARTAN POTASSIUM 50 MG TA: 50 | 90 days supply | Qty: 180 | Fill #3

## 2017-12-02 ENCOUNTER — Ambulatory Visit (INDEPENDENT_AMBULATORY_CARE_PROVIDER_SITE_OTHER): Payer: 59 | Admitting: *Deleted

## 2017-12-02 DIAGNOSIS — R55 Syncope and collapse: Secondary | ICD-10-CM | POA: Diagnosis not present

## 2017-12-03 NOTE — Progress Notes (Signed)
Carelink Summary Report / Loop Recorder 

## 2017-12-10 LAB — CUP PACEART REMOTE DEVICE CHECK
MDC IDC PG IMPLANT DT: 20170404
MDC IDC SESS DTM: 20190125031050

## 2017-12-16 ENCOUNTER — Other Ambulatory Visit: Payer: Self-pay | Admitting: Family Medicine

## 2017-12-16 MED ORDER — ALENDRONATE SODIUM 70 MG PO TABS: 70.0000 mg | ORAL_TABLET | ORAL | 3 refills | Status: DC

## 2017-12-16 NOTE — Telephone Encounter (Signed)
Rx for generic fosamax runs out in 3 weeks and he has no refills.  He isnt due for a physical until January 24 2018.  Also does he need another bone density scan? Please advise

## 2017-12-16 NOTE — Telephone Encounter (Signed)
Please let him know  I refilled his fosamax  We can discuss another density test at his physical in March  Thanks  LC

## 2017-12-21 NOTE — Telephone Encounter (Signed)
[name] for pt to call us back. Please see message below. [name], CMA  

## 2017-12-27 MED FILL — ALENDRONATE NA 70 MG TAB: 70 | 84 days supply | Qty: 12 | Fill #0

## 2018-01-04 ENCOUNTER — Ambulatory Visit: Payer: 59 | Admitting: *Deleted

## 2018-01-04 DIAGNOSIS — R55 Syncope and collapse: Secondary | ICD-10-CM | POA: Diagnosis not present

## 2018-01-05 NOTE — Progress Notes (Signed)
Carelink Summary Report / Loop Recorder 

## 2018-01-13 DIAGNOSIS — R252 Cramp and spasm: Secondary | ICD-10-CM | POA: Diagnosis not present

## 2018-01-13 DIAGNOSIS — Z131 Encounter for screening for diabetes mellitus: Secondary | ICD-10-CM | POA: Diagnosis not present

## 2018-01-13 DIAGNOSIS — N529 Male erectile dysfunction, unspecified: Secondary | ICD-10-CM | POA: Diagnosis not present

## 2018-01-13 DIAGNOSIS — E785 Hyperlipidemia, unspecified: Secondary | ICD-10-CM | POA: Diagnosis not present

## 2018-01-13 DIAGNOSIS — I10 Essential (primary) hypertension: Secondary | ICD-10-CM | POA: Diagnosis not present

## 2018-01-13 DIAGNOSIS — R809 Proteinuria, unspecified: Secondary | ICD-10-CM | POA: Diagnosis not present

## 2018-01-26 ENCOUNTER — Encounter: Payer: Self-pay | Admitting: Family Medicine

## 2018-01-26 ENCOUNTER — Ambulatory Visit (INDEPENDENT_AMBULATORY_CARE_PROVIDER_SITE_OTHER): Payer: 59 | Admitting: Family Medicine

## 2018-01-26 VITALS — BP 124/80 | HR 79 | Temp 97.8°F | Ht 69.0 in | Wt 168.0 lb

## 2018-01-26 DIAGNOSIS — Z0001 Encounter for general adult medical examination with abnormal findings: Secondary | ICD-10-CM

## 2018-01-26 DIAGNOSIS — Z125 Encounter for screening for malignant neoplasm of prostate: Secondary | ICD-10-CM

## 2018-01-26 DIAGNOSIS — Z13220 Encounter for screening for lipoid disorders: Secondary | ICD-10-CM | POA: Diagnosis not present

## 2018-01-26 DIAGNOSIS — I10 Essential (primary) hypertension: Secondary | ICD-10-CM

## 2018-01-26 DIAGNOSIS — Z23 Encounter for immunization: Secondary | ICD-10-CM | POA: Diagnosis not present

## 2018-01-26 NOTE — Addendum Note (Signed)
Addended by: Gilberto Better R on: 01/26/2018 11:40 AM   Modules accepted: Orders

## 2018-01-26 NOTE — Assessment & Plan Note (Signed)
Controlled  Check labs since in on arb and K supplement

## 2018-01-26 NOTE — Patient Instructions (Signed)
Good to see you today!  Thanks for coming in.  If the eye does not completely go back to normal in 3-4 weeks or worsens then be seen  If the foot is not better in 3 months let me know   Continue the exercise at least 20 min a day  I will call you if your tests are not good.  Otherwise I will send you a letter.  If you do not hear from me with in 2 weeks please call our office.

## 2018-01-26 NOTE — Progress Notes (Signed)
Subjective  Patient is presenting for a Physical  Other issues  Eye trauma - stuck stick in eye 2 days ago.  Seeing ok and improving but red area  L foot - area of numbness after using compression stockings severa months ago.  Does not keep him from doing anything  Patient reports no  vision/ hearing changes,anorexia, weight change, fever ,adenopathy, persistant / recurrent hoarseness, swallowing issues, chest pain, edema,persistant / recurrent cough, hemoptysis, dyspnea(rest, exertional, paroxysmal nocturnal), gastrointestinal  bleeding (melena, rectal bleeding), abdominal pain, excessive heart burn, GU symptoms(dysuria, hematuria, pyuria, voiding/incontinence  Issues) syncope, focal weakness, severe memory loss, concerning skin lesions, depression, anxiety, abnormal bruising/bleeding, major joint swelling.     Chief Complaint noted Review of Symptoms - see HPI PMH - Smoking status noted.     Objective Vital Signs reviewed Left Eye - area of injection at 4 oclock on sclera.  PERRL EOMI  Neck:  No deformities, thyromegaly, masses, or tenderness noted.   Supple with full range of motion without pain. Throat: normal mucosa, no exudate, uvula midline, no redness Lungs:  Normal respiratory effort, chest expands symmetrically. Lungs are clear to auscultation, no crackles or wheezes. Heart - Regular rate and rhythm.  No murmurs, gallops or rubs.    Abdomen: soft and non-tender without masses, organomegaly or hernias noted.  No guarding or rebound Extremities:  No cyanosis, edema, or deformity noted with good range of motion of all major joints.   Skin:  Intact without suspicious lesions or rashes     Assessments/Plans  Normal Exam Check PSA and lipids  Eye Trauma - mild scleral abrasion  See after visit summary   Essential hypertension Controlled  Check labs since in on arb and K supplement    See after visit summary for details of patient instuctions

## 2018-01-27 LAB — BASIC METABOLIC PANEL
BUN/Creatinine Ratio: 19 (ref 10–24)
BUN: 21 mg/dL (ref 8–27)
CALCIUM: 9.6 mg/dL (ref 8.6–10.2)
CO2: 24 mmol/L (ref 20–29)
CREATININE: 1.09 mg/dL (ref 0.76–1.27)
Chloride: 106 mmol/L (ref 96–106)
GFR calc Af Amer: 83 mL/min/{1.73_m2} (ref >59)
GFR, EST NON AFRICAN AMERICAN: 72 mL/min/{1.73_m2} (ref >59)
GLUCOSE: 96 mg/dL (ref 65–99)
Potassium: 4.8 mmol/L (ref 3.5–5.2)
SODIUM: 143 mmol/L (ref 134–144)

## 2018-01-27 LAB — LIPID PANEL
CHOLESTEROL TOTAL: 187 mg/dL (ref 100–199)
Chol/HDL Ratio: 3.9 ratio (ref 0.0–5.0)
HDL: 48 mg/dL (ref >39)
LDL CALC: 122 mg/dL — AB (ref 0–99)
Triglycerides: 84 mg/dL (ref 0–149)
VLDL CHOLESTEROL CAL: 17 mg/dL (ref 5–40)

## 2018-01-27 LAB — PSA: PROSTATE SPECIFIC AG, SERUM: 0.7 ng/mL (ref 0.0–4.0)

## 2018-01-28 ENCOUNTER — Telehealth: Payer: Self-pay | Admitting: Family Medicine

## 2018-01-28 NOTE — Telephone Encounter (Signed)
10 year risk is Current 10.1% Recommend statin  Spoke with him in hall at [facility] He agrees and will start lipitor 40 mg daily

## 2018-01-31 MED ORDER — ATORVASTATIN CALCIUM 40 MG PO TABS: 40.0000 mg | ORAL_TABLET | Freq: Every day | ORAL | 3 refills | Status: DC

## 2018-01-31 MED FILL — ATORVASTATIN 40 MG TABLET: 40 | 90 days supply | Qty: 90 | Fill #0

## 2018-02-07 ENCOUNTER — Ambulatory Visit (INDEPENDENT_AMBULATORY_CARE_PROVIDER_SITE_OTHER): Payer: 59 | Admitting: *Deleted

## 2018-02-07 DIAGNOSIS — I10 Essential (primary) hypertension: Secondary | ICD-10-CM | POA: Diagnosis not present

## 2018-02-07 DIAGNOSIS — Z95818 Presence of other cardiac implants and grafts: Secondary | ICD-10-CM | POA: Diagnosis not present

## 2018-02-07 DIAGNOSIS — Z1159 Encounter for screening for other viral diseases: Secondary | ICD-10-CM | POA: Diagnosis not present

## 2018-02-07 DIAGNOSIS — G4733 Obstructive sleep apnea (adult) (pediatric): Secondary | ICD-10-CM | POA: Diagnosis not present

## 2018-02-07 DIAGNOSIS — I482 Chronic atrial fibrillation: Secondary | ICD-10-CM | POA: Diagnosis not present

## 2018-02-07 DIAGNOSIS — R55 Syncope and collapse: Secondary | ICD-10-CM

## 2018-02-07 NOTE — Progress Notes (Signed)
Carelink Summary Report / Loop Recorder 

## 2018-02-24 ENCOUNTER — Other Ambulatory Visit: Payer: Self-pay | Admitting: Cardiovascular Disease

## 2018-02-24 MED FILL — LOSARTAN POTASSIUM 50 MG TA: 50 | 90 days supply | Qty: 180 | Fill #0

## 2018-03-11 ENCOUNTER — Ambulatory Visit (INDEPENDENT_AMBULATORY_CARE_PROVIDER_SITE_OTHER): Payer: 59 | Admitting: *Deleted

## 2018-03-11 DIAGNOSIS — R55 Syncope and collapse: Secondary | ICD-10-CM | POA: Diagnosis not present

## 2018-03-14 LAB — CUP PACEART REMOTE DEVICE CHECK
Date Time Interrogation Session: 20190401033703
MDC IDC PG IMPLANT DT: 20170404

## 2018-03-14 NOTE — Progress Notes (Signed)
Carelink Summary Report / Loop Recorder 

## 2018-03-17 ENCOUNTER — Telehealth: Payer: Self-pay | Admitting: *Deleted

## 2018-03-17 NOTE — Telephone Encounter (Signed)
LMOM (DPR) requesting manual Carelink transmission for review.  Gave direct number for questions/concerns.  Received alert for 25 "tachy" episodes, available ECG appears AF w/RVR (known), duration 8sec.  Will review additional ECGs when transmission is received.

## 2018-03-17 NOTE — Telephone Encounter (Signed)
Manual transmission received.  All "tachy" episode ECGs appear AF w/RVR, longest 27sec.  3 recent "pause" episodes--all appropriate, nocturnal, longest 3sec duration.

## 2018-03-24 MED FILL — ALENDRONATE NA 70 MG TAB: 70 | 84 days supply | Qty: 12 | Fill #1

## 2018-04-05 ENCOUNTER — Telehealth: Payer: Self-pay | Admitting: *Deleted

## 2018-04-05 LAB — CUP PACEART REMOTE DEVICE CHECK
Date Time Interrogation Session: 20190503105004
MDC IDC PG IMPLANT DT: 20170404

## 2018-04-05 NOTE — Telephone Encounter (Signed)
[name] (DPR) requesting manual Carelink transmission for review.  Gave Device Clinic phone number for any questions/concerns.

## 2018-04-06 NOTE — Telephone Encounter (Signed)
Manual transmission received 04/05/18.  6 recent pause episodes are all nocturnal, 3sec duration.  Tachy episode from 03/31/18 at 1711 indicates AF w/RVR, three wide-complex beats noted at the end of the ECG prior to episode falling under tachy detection.  No other recent available ECGs show a WCT.  Message routed to Dr. Royann Shivers for review/recommendations

## 2018-04-07 NOTE — Telephone Encounter (Signed)
Suspect WCT is aberrant conduction. Was he exercising when that episode occurred? He goes to the gym frequently.

## 2018-04-08 NOTE — Telephone Encounter (Signed)
LMOM requesting call back to the Device Clinic. 
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
## 2018-04-11 NOTE — Telephone Encounter (Signed)
Thank you Emily.

## 2018-04-13 ENCOUNTER — Ambulatory Visit (INDEPENDENT_AMBULATORY_CARE_PROVIDER_SITE_OTHER): Payer: 59 | Admitting: *Deleted

## 2018-04-13 DIAGNOSIS — R55 Syncope and collapse: Secondary | ICD-10-CM

## 2018-04-13 NOTE — Progress Notes (Signed)
Carelink Summary Report / Loop Recorder 

## 2018-05-10 MED FILL — ATORVASTATIN 40 MG TABLET: 40 | 90 days supply | Qty: 90 | Fill #1

## 2018-05-13 ENCOUNTER — Ambulatory Visit (INDEPENDENT_AMBULATORY_CARE_PROVIDER_SITE_OTHER): Payer: 59 | Admitting: *Deleted

## 2018-05-13 DIAGNOSIS — R55 Syncope and collapse: Secondary | ICD-10-CM

## 2018-05-17 DIAGNOSIS — R55 Syncope and collapse: Secondary | ICD-10-CM | POA: Diagnosis not present

## 2018-05-17 NOTE — Progress Notes (Signed)
Carelink Summary Report / Loop Recorder 

## 2018-05-20 LAB — CUP PACEART REMOTE DEVICE CHECK
Date Time Interrogation Session: 20190605104322
Implantable Pulse Generator Implant Date: 20170404

## 2018-05-23 ENCOUNTER — Ambulatory Visit (INDEPENDENT_AMBULATORY_CARE_PROVIDER_SITE_OTHER): Payer: 59 | Admitting: Cardiovascular Disease

## 2018-05-23 ENCOUNTER — Encounter: Payer: Self-pay | Admitting: Cardiovascular Disease

## 2018-05-23 VITALS — BP 142/88 | HR 78 | Ht 69.0 in | Wt 169.0 lb

## 2018-05-23 DIAGNOSIS — G4733 Obstructive sleep apnea (adult) (pediatric): Secondary | ICD-10-CM

## 2018-05-23 DIAGNOSIS — R55 Syncope and collapse: Secondary | ICD-10-CM

## 2018-05-23 DIAGNOSIS — I428 Other cardiomyopathies: Secondary | ICD-10-CM

## 2018-05-23 DIAGNOSIS — I482 Chronic atrial fibrillation: Secondary | ICD-10-CM

## 2018-05-23 DIAGNOSIS — I10 Essential (primary) hypertension: Secondary | ICD-10-CM | POA: Diagnosis not present

## 2018-05-23 DIAGNOSIS — I4821 Permanent atrial fibrillation: Secondary | ICD-10-CM

## 2018-05-23 NOTE — Patient Instructions (Signed)
Dr Croitoru recommends that you schedule a follow-up appointment in 12 months. You will receive a reminder letter in the mail two months in advance. If you don't receive a letter, please call our office to schedule the follow-up appointment.  If you need a refill on your cardiac medications before your next appointment, please call your pharmacy. 

## 2018-05-25 NOTE — Progress Notes (Signed)
Patient ID: Wayne Molina, male   DOB: 1956/03/07, 62 y.o.   MRN: [id number]    Cardiology Office Note    Date:  05/25/2018   ID:  Wayne Molina, DOB 10-Aug-1956, MRN [id number]  PCP:  Carney Living, MD  Cardiologist:   Thurmon Fair, MD   Chief Complaint  Patient presents with  . Follow-up    12 months. Patient says that he has a burning sensation in his left foot.    History of Present Illness:  Wayne Molina is a 62 y.o. male with long-standing permanent atrial fibrillation with spontaneous controlled ventricular response and a single episode of syncope that occurred more than 2 years ago. The loop recorder was implanted in April 2017 and has not shown an abnormality that could be clearly associated with syncope. He has a relatively frequent episodes of 3 second pauses during atrial fibrillation, but these are consistently nocturnal and always asymptomatic; longer pauses have not been seen.  He has been less physically active due to some family problems, but continues to exercise at least twice a week.  He will walk a 4 mile loop in his neighborhood [place].  He goes to the gym and lifts weights.  Physical activity he always feels well and denies problems with dizziness, lightheadedness, syncope, palpitations or shortness of breath.  He has not had chest pain or leg edema.  He has not had any focal neurological events and denies any bleeding.  As before his implanted loop recorder occasionally shows 3-second pauses most always nocturnal and during sleep.  These are never symptomatic.  He has not had syncope since the loop recorder was implanted.  He has background atrial fibrillation with well-controlled ventricular response despite the fact that he does not take any rate control medications.  He has occasional periods of rapid ventricular rates that correlate with physical exercise.  Occasionally he will have a "sinking" feeling that is very brief.  This only occurs when he  has been standing up for long periods of time (over 2 hours) such as when he is searching for antiques.  Although he does not wear CPAP, he denies any problems with daytime hypersomnolence.  Wayne Molina has permanent atrial fibrillation with spontaneously controlled ventricular response. He has long had an abnormal ECG suggestive of hypertrophic cardiomyopathy but without evidence of any structural abnormalities of the left ventricle by conventional imaging methods. He has biatrial dilatation by echocardiography. He has normal left ventricular systolic function. He has mild to moderate mitral and tricuspid insufficiency, unlikely to have a causal relationship with his atrial fibrillation. He has previously been diagnosed with sleep apnea but has been unable to tolerate CPAP. He does not have any symptoms of hypersomnolence. He has not had any focal neurological events and has never had embolic complications from his chronic atrial fibrillation. He has normal left ventricular systolic function and had a normal nuclear stress test in the past.  Past Medical History:  Diagnosis Date  . Anxiety   . Arthritis    fingers  . Chronic atrial fibrillation (HCC)   . Depression   . Family history of malignant neoplasm of gastrointestinal tract   . Hyperlipidemia   . Hypertension   . Personal history of colonic polyps 11/12/2006  . Sleep apnea     Past Surgical History:  Procedure Laterality Date  . 2-D echocardiogram  07/22/2010   Ejection fraction greater than 55%. Left atrium moderately dilated. Right atrium moderately dilated. 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 Mild to Moderate MR. Mild to moderate TR.  . ATRIAL ABLATION SURGERY  2004  . EP IMPLANTABLE DEVICE N/A 02/11/2016   Procedure: Loop Recorder Insertion;  Surgeon: Thurmon Fair, MD;  Location: [facility];  Service: Cardiovascular;  Laterality: N/A;  . Exercise Myoview stress test  07/08/2000   Nonischemic low-risk.  Marland Kitchen HIP FRACTURE SURGERY   1970s  . IR GENERIC HISTORICAL  01/21/2017   IR RADIOLOGIST EVAL & MGMT 01/21/2017 [facility] RAD    Outpatient Medications Prior to Visit  Medication Sig Dispense Refill  . alendronate (FOSAMAX) 70 MG tablet Take 1 tablet (70 mg total) by mouth every 7 (seven) days. Take with a full glass of water on an empty stomach. 12 tablet 3  . aspirin 81 MG tablet Take 81 mg by mouth daily.      Marland Kitchen atorvastatin (LIPITOR) 40 MG tablet Take 1 tablet (40 mg total) by mouth daily. 90 tablet 3  . ibuprofen (ADVIL,MOTRIN) 800 MG tablet Take 800 mg by mouth every 8 (eight) hours as needed for mild pain or moderate pain.    Marland Kitchen losartan (COZAAR) 50 MG tablet Take 2 tablets (100 mg total) by mouth daily. Please schedule appointment for refills 180 tablet 5  . Magnesium 250 MG TABS Take 1 tablet by mouth daily.    . MULTIPLE VITAMIN PO Take 1 tablet by mouth daily.    . potassium gluconate 595 (99 K) MG TABS tablet Take 595 mg by mouth daily.    . Probiotic Product (PROBIOTIC DAILY PO) Take 46 mg by mouth. [employer] probiotic 10    . sildenafil (VIAGRA) 50 MG tablet Take 1 tablet (50 mg total) by mouth daily as needed for erectile dysfunction. 10 tablet 2   No facility-administered medications prior to visit.      Allergies:   Patient has no known allergies.   Social History   Socioeconomic History  . Marital status: Divorced    Spouse name: Not on file  . Number of children: Not on file  . Years of education: Not on file  . Highest education level: Not on file  Occupational History  . Not on file  Social Needs  . Financial resource strain: Not on file  . Food insecurity:    Worry: Not on file    Inability: Not on file  . Transportation needs:    Medical: Not on file    Non-medical: Not on file  Tobacco Use  . Smoking status: Never Smoker  . Smokeless tobacco: Never Used  Substance and Sexual Activity  . Alcohol use: No  . Drug use: No  . Sexual activity: Yes  Lifestyle  . Physical activity:     Days per week: Not on file    Minutes per session: Not on file  . Stress: Not on file  Relationships  . Social connections:    Talks on phone: Not on file    Gets together: Not on file    Attends religious service: Not on file    Active member of club or organization: Not on file    Attends meetings of clubs or organizations: Not on file    Relationship status: Not on file  Other Topics Concern  . Not on file  Social History Narrative   Single has girlfriend.  Works at [employer] History:  The patient's family history includes Colon cancer in his maternal uncle and other; Heart disease in his father;  Stomach cancer in his unknown relative.   ROS:   Please see the history of present illness.    ROS All other systems reviewed and are negative.   PHYSICAL EXAM:   VS:  BP (!) 142/88 (BP Location: Left Arm, Patient Position: Sitting, Cuff Size: Normal)   Pulse 78   Ht 5\' 9"  (1.753 m)   Wt 169 lb (76.7 kg)   BMI 24.96 kg/m     General: Alert, oriented x3, no distress, looks fit Head: no evidence of trauma, PERRL, EOMI, no exophtalmos or lid lag, no myxedema, no xanthelasma; normal ears, nose and oropharynx Neck: normal jugular venous pulsations and no hepatojugular reflux; brisk carotid pulses without delay and no carotid bruits Chest: clear to auscultation, no signs of consolidation by percussion or palpation, normal fremitus, symmetrical and full respiratory excursions Cardiovascular: normal position and quality of the apical impulse, irregular rhythm, normal first and second heart sounds, no murmurs, rubs or gallops Abdomen: no tenderness or distention, no masses by palpation, no abnormal pulsatility or arterial bruits, normal bowel sounds, no hepatosplenomegaly Extremities: no clubbing, cyanosis or edema; 2+ radial, ulnar and brachial pulses bilaterally; 2+ right femoral, posterior tibial and dorsalis pedis pulses; 2+ left femoral, posterior tibial and dorsalis  pedis pulses; no subclavian or femoral bruits Neurological: grossly nonfocal Psych: Normal mood and affect   Wt Readings from Last 3 Encounters:  05/23/18 169 lb (76.7 kg)  01/26/18 168 lb (76.2 kg)  05/20/17 161 lb (73 kg)      Studies/Labs Reviewed:   EKG:  EKG is ordered today.  Atrial fibrillation with controlled rate, left ventricular hypertrophy with prominent T wave inversion leads V3-V6, similar to previous tracings.  Normal QT interval. Recent Labs: 01/26/2018: BUN 21; Creatinine, Ser 1.09; Potassium 4.8; Sodium 143   Lipid Panel    Component Value Date/Time   CHOL 187 01/26/2018 0915   TRIG 84 01/26/2018 0915   HDL 48 01/26/2018 0915   CHOLHDL 3.9 01/26/2018 0915   CHOLHDL 4.1 12/06/2013 1112   VLDL 14 12/06/2013 1112   LDLCALC 122 (H) 01/26/2018 0915    ASSESSMENT:    1. Syncope, unspecified syncope type   2. Permanent atrial fibrillation (HCC)   3. Other cardiomyopathy (HCC)   4. OSA (obstructive sleep apnea)   5. Essential hypertension      PLAN:  In order of problems listed above:  1. Syncope: Etiology of this remains unclear, not recurred in over 2 years.  His loop recorder has shown some pauses, but these have invariably been doing sleep.  I asked him to make sure that he uses the activator to record his rhythm during his episodes of "sinking feeling" although this sounds more like orthostatic hypotension or dehydration, rather than a true arrhythmia. 2. A. Fib: Not requiring rate control medications, suggesting that he has significant AV node disease.  Generally seems to be asymptomatic.  As before, we discussed the fact that eventually he will probably need a pacemaker.  We have discussed in the past that this might eventually need pacemaker implantation. He remains at low embolic risk, CHADSVasc score 1, on aspirin.  When he comes back to his next appointment in a year he will be 62 years old and officially will meet criteria for full dose  anticoagulation. 3. Cardiomyopathy: His EKG suggests apical variant hypertrophic cardiomyopathy, but his echo has never found convincing evidence of this diagnosis. His EKG has been quite abnormal for years.  No ventricular arrhythmia has ever been  detected clinically or on over 2 years of loop recorder monitoring.  He does not have any signs or symptoms of congestive heart failure. 4. OSA: Its possible that his nocturnal pauses are related to obstructive sleep apnea.  He reports trying numerous different CPAP masks and devices unsuccessfully.  A jaw advancement device would not work since he is edentulous and wears dentures. 5. HTN: Well-controlled.  Recheck blood pressure today 132/81 mmHg, still officially high, encouraged him to return to more regular physical exercise, rather than changing his medications    Medication Adjustments/Labs and Tests Ordered: Current medicines are reviewed at length with the patient today.  Concerns regarding medicines are outlined above.  Medication changes, Labs and Tests ordered today are listed in the Patient Instructions below. Patient Instructions  Dr Royann Shivers recommends that you schedule a follow-up appointment in 12 months. You will receive a reminder letter in the mail two months in advance. If you don't receive a letter, please call our office to schedule the follow-up appointment.  If you need a refill on your cardiac medications before your next appointment, please call your pharmacy.       Signed, Thurmon Fair, MD  05/25/2018 3:26 PM    [facility] [address] [place], [place], [place]  [address] Phone: [phone]; Fax: [phone]

## 2018-06-08 ENCOUNTER — Telehealth: Payer: Self-pay | Admitting: *Deleted

## 2018-06-08 DIAGNOSIS — I428 Other cardiomyopathies: Secondary | ICD-10-CM

## 2018-06-08 NOTE — Telephone Encounter (Addendum)
LVMOM requesting call back to [facility] regarding symptoms associated with symptom episode 06/07/18 at 15:36 and to send a manual transmission. Gave [facility] phone number to call back.

## 2018-06-09 NOTE — Telephone Encounter (Signed)
LVMOM requesting call back regarding symptoms associated with symptom episode 06/07/18 at 15:36.   Received and reviewed Manual transmission.

## 2018-06-09 NOTE — Telephone Encounter (Signed)
Pt was returning Lexi phone call. I did advised him to send a manual transmission. I stated that I will have Lexi give him a call back.

## 2018-06-10 MED FILL — ALENDRONATE NA 70 MG TAB: 70 | 84 days supply | Qty: 12 | Fill #2

## 2018-06-10 MED FILL — LOSARTAN POTASSIUM 50 MG TA: 50 | 90 days supply | Qty: 180 | Fill #1

## 2018-06-10 NOTE — Telephone Encounter (Signed)
Scheduled for echo at 2pm Monday 06/13/18 at the [facility] office. Order attached. Patient aware and agreeable.

## 2018-06-10 NOTE — Telephone Encounter (Signed)
Mr. Wayne Molina reports that he was at work and has dizziness for 2 seconds and he shortly thereafter used the symptom activator. Permanent AF, no OAC (CHADSVASC 1).  Counters Since 13-May-2018 06:22 Symptom 1 Tachy 23  Pause 9  Brady 0  AT 0  AF 0  % of Time in AT/AF 100.0% 100.0%     Reviewed with Gypsy Balsam, NP. Probable NSVT although it could be AFib with abberancy.  VO received for echocardiogram next week. Patient aware and agreeable.

## 2018-06-10 NOTE — Telephone Encounter (Signed)
Pt was returning lexi phone call. I let him talk with device tech nurse.

## 2018-06-10 NOTE — Telephone Encounter (Signed)
Reviewed over the phone with Dr. Elberta Fortis- he recommends waiting on the echo results unless more episodes are noted. No medication changes at this time.

## 2018-06-13 ENCOUNTER — Telehealth: Payer: Self-pay

## 2018-06-13 ENCOUNTER — Other Ambulatory Visit (HOSPITAL_COMMUNITY): Payer: 59

## 2018-06-13 NOTE — Telephone Encounter (Signed)
Pt called wanting to cancel and reschedule his appointment for today at 2 pm. He prefers morning appointment. Please give pt a call back at [phone]

## 2018-06-17 ENCOUNTER — Telehealth: Payer: Self-pay | Admitting: *Deleted

## 2018-06-17 LAB — CUP PACEART REMOTE DEVICE CHECK
MDC IDC PG IMPLANT DT: 20170404
MDC IDC SESS DTM: 20190709124043

## 2018-06-17 NOTE — Telephone Encounter (Signed)
LMOM (DPR) requesting that patient send a manual Carelink transmission for review. Gave DC phone number for questions/concerns.

## 2018-06-20 ENCOUNTER — Ambulatory Visit (INDEPENDENT_AMBULATORY_CARE_PROVIDER_SITE_OTHER): Payer: 59 | Admitting: *Deleted

## 2018-06-20 DIAGNOSIS — R55 Syncope and collapse: Secondary | ICD-10-CM | POA: Diagnosis not present

## 2018-06-20 NOTE — Progress Notes (Signed)
Carelink Summary Report / Loop Recorder 

## 2018-06-20 NOTE — Telephone Encounter (Signed)
Manual transmission received.  All new "tachy" episode ECGs show brief AF w/RVR.

## 2018-06-23 ENCOUNTER — Ambulatory Visit (HOSPITAL_COMMUNITY): Payer: 59 | Attending: Cardiology

## 2018-06-23 ENCOUNTER — Other Ambulatory Visit: Payer: Self-pay

## 2018-06-23 DIAGNOSIS — I083 Combined rheumatic disorders of mitral, aortic and tricuspid valves: Secondary | ICD-10-CM | POA: Insufficient documentation

## 2018-06-23 DIAGNOSIS — I119 Hypertensive heart disease without heart failure: Secondary | ICD-10-CM | POA: Insufficient documentation

## 2018-06-23 DIAGNOSIS — I428 Other cardiomyopathies: Secondary | ICD-10-CM | POA: Diagnosis not present

## 2018-06-23 DIAGNOSIS — R55 Syncope and collapse: Secondary | ICD-10-CM | POA: Diagnosis not present

## 2018-06-23 DIAGNOSIS — G4733 Obstructive sleep apnea (adult) (pediatric): Secondary | ICD-10-CM | POA: Diagnosis not present

## 2018-06-23 DIAGNOSIS — I4891 Unspecified atrial fibrillation: Secondary | ICD-10-CM | POA: Insufficient documentation

## 2018-07-05 ENCOUNTER — Telehealth: Payer: Self-pay | Admitting: *Deleted

## 2018-07-05 NOTE — Telephone Encounter (Signed)
LMOM requesting manual Carelink transmission for review. Gave DC phone number for questions/concerns. 

## 2018-07-05 NOTE — Telephone Encounter (Signed)
Manual transmission received. 14 tachy episodes all show brief AF w/RVR, longest 47sec. 5 pause episodes all nocturnal, 3-4sec duration. All episodes/ECGs consistent with previous episodes, no patient-activated symptom episodes. Will continue to monitor remotely via Carelink.

## 2018-07-21 ENCOUNTER — Telehealth: Payer: Self-pay | Admitting: *Deleted

## 2018-07-21 NOTE — Telephone Encounter (Signed)
[name] regarding sending a manual transmission. 6 pause episodes-- 1 available ECG appears nocturnal. Gave Device clinic phone number to call back if questions or concerns.

## 2018-07-22 ENCOUNTER — Ambulatory Visit (INDEPENDENT_AMBULATORY_CARE_PROVIDER_SITE_OTHER): Payer: 59 | Admitting: *Deleted

## 2018-07-22 DIAGNOSIS — R55 Syncope and collapse: Secondary | ICD-10-CM | POA: Diagnosis not present

## 2018-07-22 NOTE — Telephone Encounter (Signed)
Manual transmission received and reviewed. 5 tachy episodes appear brief AF w/RVR (<33min duration), all available pause episodes are nocturnal, durations 3-4sec.

## 2018-07-24 NOTE — Progress Notes (Signed)
Carelink Summary Report / Loop Recorder 

## 2018-07-27 LAB — CUP PACEART REMOTE DEVICE CHECK
Implantable Pulse Generator Implant Date: 20170404
MDC IDC SESS DTM: 20190811134125

## 2018-07-28 ENCOUNTER — Telehealth: Payer: Self-pay | Admitting: *Deleted

## 2018-07-28 NOTE — Telephone Encounter (Signed)
LMOM requesting manual Carelink transmission for review. 2 "pause" episodes noted by device. Available ECG is nocturnal, 3sec duration, occurred on 07/23/18.

## 2018-08-01 ENCOUNTER — Other Ambulatory Visit: Payer: Self-pay | Admitting: Cardiovascular Disease

## 2018-08-03 NOTE — Telephone Encounter (Signed)
Manual transmission received on 07/29/18. All new pause episodes are appropriate, nocturnal, durations ~3sec.

## 2018-08-06 LAB — CUP PACEART REMOTE DEVICE CHECK
Date Time Interrogation Session: 20190913144031
MDC IDC PG IMPLANT DT: 20170404

## 2018-08-11 MED FILL — ATORVASTATIN 40 MG TABLET: 40 | 90 days supply | Qty: 90 | Fill #2

## 2018-08-16 ENCOUNTER — Telehealth: Payer: Self-pay | Admitting: *Deleted

## 2018-08-16 NOTE — Telephone Encounter (Signed)
LMOM (DPR) requesting manual transmission from Carelink monitor. Gave DC phone number for questions/concerns. Need to review any pause/tachy episodes that have not transmitted automatically.

## 2018-08-17 NOTE — Telephone Encounter (Signed)
Manual transmission received and reviewed. Tachy ECGs show brief AF w/RVR. All pause episodes are appropriate and nocturnal, durations 3-4sec.

## 2018-08-24 ENCOUNTER — Ambulatory Visit (INDEPENDENT_AMBULATORY_CARE_PROVIDER_SITE_OTHER): Payer: 59 | Admitting: *Deleted

## 2018-08-24 DIAGNOSIS — R55 Syncope and collapse: Secondary | ICD-10-CM

## 2018-08-25 NOTE — Progress Notes (Signed)
Carelink Summary Report / Loop Recorder 

## 2018-09-05 LAB — CUP PACEART REMOTE DEVICE CHECK
Date Time Interrogation Session: 20191016154057
Implantable Pulse Generator Implant Date: 20170404

## 2018-09-08 MED FILL — LOSARTAN POTASSIUM 50 MG TA: 50 | 90 days supply | Qty: 180 | Fill #2

## 2018-09-08 MED FILL — ALENDRONATE NA 70 MG TAB: 70 | 84 days supply | Qty: 12 | Fill #3

## 2018-09-26 ENCOUNTER — Ambulatory Visit (INDEPENDENT_AMBULATORY_CARE_PROVIDER_SITE_OTHER): Payer: 59

## 2018-09-26 DIAGNOSIS — R55 Syncope and collapse: Secondary | ICD-10-CM

## 2018-09-27 NOTE — Progress Notes (Signed)
Carelink Summary Report / Loop Recorder 

## 2018-10-03 ENCOUNTER — Telehealth: Payer: Self-pay | Admitting: Cardiology

## 2018-10-03 NOTE — Telephone Encounter (Signed)
[name] requesting that pt send manual transmission b/c home monitor has not updated in at least 14 days.    

## 2018-10-10 ENCOUNTER — Telehealth: Payer: Self-pay | Admitting: *Deleted

## 2018-10-10 NOTE — Telephone Encounter (Signed)
LMOM requesting manual Carelink transmission for review. Gave DC phone number for questions/concerns.  Received alert for 8 tachy episodes and 7 pause episodes--available ECGs are appropriate, pauses are nocturnal. Will review additional ECGs when full report received.

## 2018-10-11 NOTE — Telephone Encounter (Signed)
Manual transmission received and reviewed. All available pause episodes are nocturnal, duration 3sec.

## 2018-10-18 ENCOUNTER — Telehealth: Payer: Self-pay

## 2018-10-18 NOTE — Telephone Encounter (Signed)
[name] reminding pt to send remote transmission.   

## 2018-10-19 NOTE — Telephone Encounter (Signed)
Manual transmission received and reviewed. Tachy episode ECGs show brief AF w/RVR. All recent pause episodes are nocturnal, duration 3sec.

## 2018-10-31 ENCOUNTER — Ambulatory Visit (INDEPENDENT_AMBULATORY_CARE_PROVIDER_SITE_OTHER): Payer: 59

## 2018-10-31 ENCOUNTER — Telehealth: Payer: Self-pay

## 2018-10-31 DIAGNOSIS — R55 Syncope and collapse: Secondary | ICD-10-CM | POA: Diagnosis not present

## 2018-10-31 DIAGNOSIS — I428 Other cardiomyopathies: Secondary | ICD-10-CM

## 2018-10-31 LAB — CUP PACEART REMOTE DEVICE CHECK
Date Time Interrogation Session: 20191223124248
Implantable Pulse Generator Implant Date: 20170404

## 2018-10-31 NOTE — Telephone Encounter (Signed)
Called and left patient a message to send manual transmission for LINQ ILR.

## 2018-10-31 NOTE — Progress Notes (Signed)
Carelink Summary Report / Loop Recorder 

## 2018-11-01 NOTE — Telephone Encounter (Signed)
Patient returned call and stated that he sent in a manual transmission yesterday morning around 7AM. Device clinic to check.

## 2018-11-01 NOTE — Telephone Encounter (Signed)
Transmission was received--all new "pause" episodes are appropriate, nocturnal, 3sec duration.

## 2018-11-10 ENCOUNTER — Telehealth: Payer: Self-pay

## 2018-11-10 MED FILL — ATORVASTATIN 40 MG TABLET: 40 | 90 days supply | Qty: 90 | Fill #3

## 2018-11-10 NOTE — Telephone Encounter (Signed)
LVM for pt asking to send in a remote transmission.

## 2018-11-11 NOTE — Telephone Encounter (Signed)
Manual transmission received and reviewed. All available "tachy" episodes show short duration AF w/RVR. "Pause" episodes are all appropriate, duration 3sec, nocturnal.

## 2018-11-13 LAB — CUP PACEART REMOTE DEVICE CHECK
Date Time Interrogation Session: 20191118154012
Implantable Pulse Generator Implant Date: 20170404

## 2018-12-01 ENCOUNTER — Ambulatory Visit (INDEPENDENT_AMBULATORY_CARE_PROVIDER_SITE_OTHER): Payer: 59

## 2018-12-01 DIAGNOSIS — R55 Syncope and collapse: Secondary | ICD-10-CM

## 2018-12-02 NOTE — Progress Notes (Signed)
Carelink Summary Report / Loop Recorder 

## 2018-12-04 LAB — CUP PACEART REMOTE DEVICE CHECK
Date Time Interrogation Session: 20200123171120
Implantable Pulse Generator Implant Date: 20170404

## 2018-12-06 ENCOUNTER — Other Ambulatory Visit: Payer: Self-pay | Admitting: Family Medicine

## 2018-12-06 MED FILL — ALENDRONATE NA 70 MG TAB: 70 | 84 days supply | Qty: 12 | Fill #0

## 2018-12-09 MED FILL — LOSARTAN POTASSIUM 50 MG TA: 50 | 90 days supply | Qty: 180 | Fill #3

## 2018-12-27 ENCOUNTER — Telehealth: Payer: Self-pay

## 2018-12-27 NOTE — Telephone Encounter (Signed)
I left a message for the pt to send a manual transmission with his home monitor.

## 2018-12-28 NOTE — Telephone Encounter (Signed)
Transmission received pause nocturnal previously documented

## 2019-01-03 ENCOUNTER — Ambulatory Visit (INDEPENDENT_AMBULATORY_CARE_PROVIDER_SITE_OTHER): Payer: 59 | Admitting: *Deleted

## 2019-01-03 DIAGNOSIS — R55 Syncope and collapse: Secondary | ICD-10-CM | POA: Diagnosis not present

## 2019-01-05 LAB — CUP PACEART REMOTE DEVICE CHECK
Date Time Interrogation Session: 20200225173941
Implantable Pulse Generator Implant Date: 20170404

## 2019-01-10 NOTE — Progress Notes (Signed)
Carelink Summary Report / Loop Recorder 

## 2019-01-30 ENCOUNTER — Telehealth: Payer: Self-pay | Admitting: Family Medicine

## 2019-01-30 NOTE — Telephone Encounter (Signed)
He is fine  He will reschedule as needed  Recommend call about blood work in a 2 months

## 2019-01-31 ENCOUNTER — Encounter: Payer: 59 | Admitting: Family Medicine

## 2019-01-31 ENCOUNTER — Other Ambulatory Visit: Payer: Self-pay | Admitting: Family Medicine

## 2019-02-01 MED ORDER — ATORVASTATIN CALCIUM 40 MG PO TABS: 40.0000 mg | ORAL_TABLET | Freq: Every day | ORAL | 3 refills | Status: DC

## 2019-02-01 MED FILL — ATORVASTATIN 40 MG TABLET: 40 | 90 days supply | Qty: 90 | Fill #0

## 2019-02-06 ENCOUNTER — Ambulatory Visit (INDEPENDENT_AMBULATORY_CARE_PROVIDER_SITE_OTHER): Payer: 59 | Admitting: *Deleted

## 2019-02-06 ENCOUNTER — Other Ambulatory Visit: Payer: Self-pay

## 2019-02-06 DIAGNOSIS — R55 Syncope and collapse: Secondary | ICD-10-CM | POA: Diagnosis not present

## 2019-02-06 DIAGNOSIS — I4821 Permanent atrial fibrillation: Secondary | ICD-10-CM

## 2019-02-06 LAB — CUP PACEART REMOTE DEVICE CHECK
Date Time Interrogation Session: 20200329194104
Implantable Pulse Generator Implant Date: 20170404

## 2019-02-15 NOTE — Progress Notes (Signed)
Carelink Summary Report / Loop Recorder 

## 2019-02-20 MED FILL — ALENDRONATE NA 70 MG TAB: 70 | 84 days supply | Qty: 12 | Fill #0

## 2019-02-20 MED FILL — LOSARTAN POTASSIUM 50 MG TA: 50 | 30 days supply | Qty: 60 | Fill #0

## 2019-03-07 ENCOUNTER — Encounter: Payer: Self-pay | Admitting: Family Medicine

## 2019-03-10 ENCOUNTER — Ambulatory Visit (INDEPENDENT_AMBULATORY_CARE_PROVIDER_SITE_OTHER): Payer: 59 | Admitting: *Deleted

## 2019-03-10 ENCOUNTER — Other Ambulatory Visit: Payer: Self-pay

## 2019-03-10 DIAGNOSIS — R55 Syncope and collapse: Secondary | ICD-10-CM | POA: Diagnosis not present

## 2019-03-12 LAB — CUP PACEART REMOTE DEVICE CHECK
Date Time Interrogation Session: 20200501121035
Implantable Pulse Generator Implant Date: 20170404

## 2019-03-16 NOTE — Progress Notes (Signed)
Carelink Summary Report / Loop Recorder 

## 2019-03-29 ENCOUNTER — Other Ambulatory Visit: Payer: Self-pay | Admitting: Cardiovascular Disease

## 2019-04-10 MED FILL — LOSARTAN POTASSIUM 50 MG TA: 50 | 30 days supply | Qty: 60 | Fill #0

## 2019-04-12 ENCOUNTER — Ambulatory Visit (INDEPENDENT_AMBULATORY_CARE_PROVIDER_SITE_OTHER): Payer: 59 | Admitting: *Deleted

## 2019-04-12 DIAGNOSIS — R55 Syncope and collapse: Secondary | ICD-10-CM | POA: Diagnosis not present

## 2019-04-13 LAB — CUP PACEART REMOTE DEVICE CHECK
Date Time Interrogation Session: 20200603201039
Implantable Pulse Generator Implant Date: 20170404

## 2019-04-21 NOTE — Progress Notes (Signed)
Carelink Summary Report / Loop Recorder 

## 2019-05-10 MED FILL — ATORVASTATIN 40 MG TABLET: 40 | 90 days supply | Qty: 90 | Fill #1

## 2019-05-15 ENCOUNTER — Ambulatory Visit (INDEPENDENT_AMBULATORY_CARE_PROVIDER_SITE_OTHER): Payer: Medicare Other | Admitting: *Deleted

## 2019-05-15 DIAGNOSIS — R55 Syncope and collapse: Secondary | ICD-10-CM | POA: Diagnosis not present

## 2019-05-15 MED FILL — LOSARTAN POTASSIUM 50 MG TA: 50 | 30 days supply | Qty: 60 | Fill #1

## 2019-05-15 MED FILL — ALENDRONATE NA 70 MG TAB: 70 | 28 days supply | Qty: 4 | Fill #0

## 2019-05-15 MED FILL — ATORVASTATIN 40 MG TABLET: 40 | 30 days supply | Qty: 30 | Fill #0

## 2019-05-16 LAB — CUP PACEART REMOTE DEVICE CHECK
Date Time Interrogation Session: 20200706201042
Implantable Pulse Generator Implant Date: 20170404

## 2019-05-23 ENCOUNTER — Telehealth: Payer: Self-pay | Admitting: *Deleted

## 2019-05-23 NOTE — Telephone Encounter (Signed)
A message was left, re: follow up visit. 

## 2019-05-24 ENCOUNTER — Encounter: Payer: Self-pay | Admitting: Family Medicine

## 2019-05-24 ENCOUNTER — Other Ambulatory Visit: Payer: Self-pay

## 2019-05-24 ENCOUNTER — Ambulatory Visit (INDEPENDENT_AMBULATORY_CARE_PROVIDER_SITE_OTHER): Payer: Medicare Other | Admitting: Family Medicine

## 2019-05-24 VITALS — BP 150/90 | HR 73 | Wt 168.0 lb

## 2019-05-24 DIAGNOSIS — Z23 Encounter for immunization: Secondary | ICD-10-CM

## 2019-05-24 DIAGNOSIS — E785 Hyperlipidemia, unspecified: Secondary | ICD-10-CM | POA: Diagnosis not present

## 2019-05-24 DIAGNOSIS — I10 Essential (primary) hypertension: Secondary | ICD-10-CM

## 2019-05-24 DIAGNOSIS — R399 Unspecified symptoms and signs involving the genitourinary system: Secondary | ICD-10-CM | POA: Insufficient documentation

## 2019-05-24 DIAGNOSIS — Z13220 Encounter for screening for lipoid disorders: Secondary | ICD-10-CM | POA: Diagnosis not present

## 2019-05-24 NOTE — Patient Instructions (Addendum)
Good to see you today!  Thanks for coming in.  I will call you if your tests are not good.  Otherwise I will send you a message.  If you do not hear from me with in 2 weeks please call our office.     HYPERTENSION - check your blood pressure once or twice a day  - Write down the readings - Bring the readings and your blood pressure cuff to see Dr C.  OR if the readings are mostly > 140/90 either number then message me the readings   If the prostate is bothering you a lot let me know

## 2019-05-24 NOTE — Assessment & Plan Note (Signed)
Not well controlled by report.  Asked him to keep record and bring during next visit or contact me if consistently high.  Will check labs today

## 2019-05-24 NOTE — Assessment & Plan Note (Signed)
Very likely BPH.  He does not wish prostate cancer screening.  Does not wish treatment currently

## 2019-05-24 NOTE — Progress Notes (Signed)
Subjective  Wayne Molina is a 63 y.o. male is presenting for a check up  Overall feels well.  Has not been eating as well as he would like nor exercising as much  HYPERTENSION  Does not check regularly although a few days ago when he felt it was up was 196/75.  Taking his medication regularly.  No chest pain or shortness of breath or leg swelling  LIPIDs Taking lipitor daily  AFIB Asa low dose daily No recent syncope  Patient reports no  vision/ hearing changes,anorexia, weight change, fever ,adenopathy, persistant / recurrent hoarseness, swallowing issues, chest pain, edema,persistant / recurrent cough, hemoptysis, dyspnea(rest, exertional, paroxysmal nocturnal), gastrointestinal  bleeding (melena, rectal bleeding), abdominal pain, excessive heart burn, GU symptoms(dysuria, hematuria, pyuria,   Issues) syncope, focal weakness, severe memory loss, concerning skin lesions, depression, anxiety, abnormal bruising/bleeding, major joint swelling.    Does report occsl symptoms of urgency and hesitancy with urination that does not interfere with his function    Chief Complaint noted Review of Symptoms - see HPI PMH - Smoking status noted.    Objective Vital Signs reviewed BP (!) 150/90   Pulse 73   Wt 168 lb (76.2 kg)   SpO2 99%   BMI 24.81 kg/m  Heart -  Primarily Regular rate and rhythm occsl irregular beat.  No murmurs, gallops or rubs.    Lungs:  Normal respiratory effort, chest expands symmetrically. Lungs are clear to auscultation, no crackles or wheezes. Abdomen: soft and non-tender without masses, organomegaly or hernias noted.  No guarding or rebound Extremities:  No cyanosis, edema, or deformity noted with good range of motion of all major joints.    Assessments/Plans  See after visit summary for details of patient instuctions  Essential hypertension Not well controlled by report.  Asked him to keep record and bring during next visit or contact me if consistently high.   Will check labs today   Hyperlipidemia Tolerating medication.  Check labs   Lower urinary tract symptoms (LUTS) Very likely BPH.  He does not wish prostate cancer screening.  Does not wish treatment currently   Discussed trying to resume regular exercise and his previously healthy diet

## 2019-05-24 NOTE — Assessment & Plan Note (Signed)
Tolerating medication.  Check labs

## 2019-05-25 LAB — CMP14+EGFR
ALT: 22 IU/L (ref 0–44)
AST: 28 IU/L (ref 0–40)
Albumin/Globulin Ratio: 2 (ref 1.2–2.2)
Albumin: 4.7 g/dL (ref 3.8–4.8)
Alkaline Phosphatase: 58 IU/L (ref 39–117)
BUN/Creatinine Ratio: 14 (ref 10–24)
BUN: 17 mg/dL (ref 8–27)
Bilirubin Total: 2.8 mg/dL — ABNORMAL HIGH (ref 0.0–1.2)
CO2: 22 mmol/L (ref 20–29)
Calcium: 10.4 mg/dL — ABNORMAL HIGH (ref 8.6–10.2)
Chloride: 99 mmol/L (ref 96–106)
Creatinine, Ser: 1.22 mg/dL (ref 0.76–1.27)
GFR calc Af Amer: 71 mL/min/{1.73_m2} (ref >59)
GFR calc non Af Amer: 62 mL/min/{1.73_m2} (ref >59)
Globulin, Total: 2.4 g/dL (ref 1.5–4.5)
Glucose: 92 mg/dL (ref 65–99)
Potassium: 4.6 mmol/L (ref 3.5–5.2)
Sodium: 138 mmol/L (ref 134–144)
Total Protein: 7.1 g/dL (ref 6.0–8.5)

## 2019-05-25 LAB — LIPID PANEL
Chol/HDL Ratio: 3 ratio (ref 0.0–5.0)
Cholesterol, Total: 137 mg/dL (ref 100–199)
HDL: 46 mg/dL (ref >39)
LDL Calculated: 75 mg/dL (ref 0–99)
Triglycerides: 82 mg/dL (ref 0–149)
VLDL Cholesterol Cal: 16 mg/dL (ref 5–40)

## 2019-05-26 ENCOUNTER — Other Ambulatory Visit: Payer: Self-pay | Admitting: Family Medicine

## 2019-05-26 ENCOUNTER — Encounter: Payer: Self-pay | Admitting: Family Medicine

## 2019-05-26 DIAGNOSIS — R17 Unspecified jaundice: Secondary | ICD-10-CM | POA: Insufficient documentation

## 2019-05-26 NOTE — Progress Notes (Signed)
Carelink Summary Report / Loop Recorder 

## 2019-05-26 NOTE — Assessment & Plan Note (Signed)
Unsuspected elevation on cmet.  Will repeat

## 2019-06-13 MED FILL — LOSARTAN POTASSIUM 50 MG TA: 50 | 30 days supply | Qty: 60 | Fill #2

## 2019-06-13 MED FILL — ALENDRONATE NA 70 MG TAB: 70 | 28 days supply | Qty: 4 | Fill #1

## 2019-06-13 MED FILL — ATORVASTATIN 40 MG TABLET: 40 | 30 days supply | Qty: 30 | Fill #1

## 2019-06-18 LAB — CUP PACEART REMOTE DEVICE CHECK
Date Time Interrogation Session: 20200808203730
Implantable Pulse Generator Implant Date: 20170404

## 2019-06-19 ENCOUNTER — Ambulatory Visit (INDEPENDENT_AMBULATORY_CARE_PROVIDER_SITE_OTHER): Payer: Medicare PPO | Admitting: *Deleted

## 2019-06-19 DIAGNOSIS — R55 Syncope and collapse: Secondary | ICD-10-CM

## 2019-06-23 ENCOUNTER — Other Ambulatory Visit: Payer: Self-pay

## 2019-06-23 ENCOUNTER — Other Ambulatory Visit: Payer: TRICARE For Life (TFL)

## 2019-06-23 DIAGNOSIS — R17 Unspecified jaundice: Secondary | ICD-10-CM

## 2019-06-24 LAB — HEPATIC FUNCTION PANEL
ALT: 24 IU/L (ref 0–44)
AST: 29 IU/L (ref 0–40)
Albumin: 4.4 g/dL (ref 3.8–4.8)
Alkaline Phosphatase: 58 IU/L (ref 39–117)
Bilirubin Total: 1.3 mg/dL — ABNORMAL HIGH (ref 0.0–1.2)
Bilirubin, Direct: 0.36 mg/dL (ref 0.00–0.40)
Total Protein: 6.9 g/dL (ref 6.0–8.5)

## 2019-06-24 LAB — BILIRUBIN, FRACTIONATED(TOT/DIR/INDIR): Bilirubin, Indirect: 0.94 mg/dL — ABNORMAL HIGH (ref 0.10–0.80)

## 2019-06-26 ENCOUNTER — Encounter: Payer: Self-pay | Admitting: Family Medicine

## 2019-06-27 NOTE — Progress Notes (Signed)
Carelink Summary Report / Loop Recorder 

## 2019-07-10 ENCOUNTER — Other Ambulatory Visit: Payer: Self-pay

## 2019-07-10 ENCOUNTER — Encounter: Payer: Self-pay | Admitting: Family Medicine

## 2019-07-10 ENCOUNTER — Ambulatory Visit (INDEPENDENT_AMBULATORY_CARE_PROVIDER_SITE_OTHER): Payer: Medicare PPO | Admitting: Family Medicine

## 2019-07-10 DIAGNOSIS — Z23 Encounter for immunization: Secondary | ICD-10-CM

## 2019-07-10 DIAGNOSIS — R17 Unspecified jaundice: Secondary | ICD-10-CM

## 2019-07-10 DIAGNOSIS — I10 Essential (primary) hypertension: Secondary | ICD-10-CM

## 2019-07-10 NOTE — Patient Instructions (Signed)
Good to see you today!  Thanks for coming in.  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

## 2019-07-10 NOTE — Assessment & Plan Note (Signed)
Improved control.  Continue current medications and lifestyle modification and monitor

## 2019-07-10 NOTE — Assessment & Plan Note (Signed)
Consistent with Gilberts syndrome.  Discussed benign nature

## 2019-07-10 NOTE — Progress Notes (Signed)
Subjective  Wayne Molina is a 63 y.o. male is presenting with the following  HYPERTENSION Disease Monitoring  Home BP Monitoring (Severity) 140s-lower 90s to 110s/80s  Symptoms - Chest pain- no    Dyspnea- no Medications (Modifying factors) Compliance-  Has been avoiding fast food and regular walking. Takes losartan 100 mg daily Lightheadedness-  no  Edema- no Timing - continuous  Duration - years ROS - See HPI  PMH Lab Review   Potassium  Date Value Ref Range Status  05/24/2019 4.6 3.5 - 5.2 mmol/L Final   Sodium  Date Value Ref Range Status  05/24/2019 138 134 - 144 mmol/L Final   Creat  Date Value Ref Range Status  09/18/2015 0.97 0.70 - 1.25 mg/dL Final   Creatinine, Ser  Date Value Ref Range Status  05/24/2019 1.22 0.76 - 1.27 mg/dL Final      ELEVATED BILIRUBIN  No abdomen pain or jaundice.  Unsure if has ever been told had this in past.    Social History   Social History Narrative   Single has girlfriend.  Works at [employer] noted Review of Symptoms - see HPI PMH - Smoking status noted.    Objective Vital Signs reviewed BP 138/84   Pulse 75   Wt 168 lb 3.2 oz (76.3 kg)   SpO2 99%   BMI 24.84 kg/m   Assessments/Plans  See after visit summary for details of patient instuctions  Elevated bilirubin Consistent with Gilberts syndrome.  Discussed benign nature   Essential hypertension Improved control.  Continue current medications and lifestyle modification and monitor

## 2019-07-12 ENCOUNTER — Other Ambulatory Visit: Payer: Self-pay | Admitting: Cardiovascular Disease

## 2019-07-12 MED FILL — LOSARTAN POTASSIUM 50 MG TA: 50 | 30 days supply | Qty: 60 | Fill #0

## 2019-07-12 MED FILL — ALENDRONATE NA 70 MG TAB: 70 | 28 days supply | Qty: 4 | Fill #2

## 2019-07-12 MED FILL — ATORVASTATIN 40 MG TABLET: 40 | 30 days supply | Qty: 30 | Fill #2

## 2019-07-20 ENCOUNTER — Ambulatory Visit (INDEPENDENT_AMBULATORY_CARE_PROVIDER_SITE_OTHER): Payer: Medicare PPO | Admitting: *Deleted

## 2019-07-20 DIAGNOSIS — R55 Syncope and collapse: Secondary | ICD-10-CM

## 2019-07-21 LAB — CUP PACEART REMOTE DEVICE CHECK
Date Time Interrogation Session: 20200910214207
Implantable Pulse Generator Implant Date: 20170404

## 2019-07-31 NOTE — Progress Notes (Signed)
Carelink Summary Report / Loop Recorder 

## 2019-08-10 MED FILL — ALENDRONATE NA 70 MG TAB: 70 | 28 days supply | Qty: 4 | Fill #3

## 2019-08-16 MED FILL — ATORVASTATIN 40 MG TABLET: 40 | 30 days supply | Qty: 30 | Fill #3

## 2019-08-21 ENCOUNTER — Telehealth: Payer: Self-pay | Admitting: Emergency Medicine

## 2019-08-21 NOTE — Telephone Encounter (Signed)
I see no reason why Medicare would not cover it.

## 2019-08-21 NOTE — Telephone Encounter (Signed)
Notified his Cruz Condon is at [facility]. Patient does not want to have LINQ extraction.   Concerned about whether Medicare will cover office visit with Dr. Sallyanne Kuster 08/31/19.

## 2019-08-22 ENCOUNTER — Ambulatory Visit (INDEPENDENT_AMBULATORY_CARE_PROVIDER_SITE_OTHER): Payer: Medicare PPO | Admitting: *Deleted

## 2019-08-22 DIAGNOSIS — R55 Syncope and collapse: Secondary | ICD-10-CM

## 2019-08-23 LAB — CUP PACEART REMOTE DEVICE CHECK
Date Time Interrogation Session: 20201013214417
Implantable Pulse Generator Implant Date: 20170404

## 2019-08-23 MED FILL — LOSARTAN POTASSIUM 50 MG TA: 50 | 30 days supply | Qty: 60 | Fill #1

## 2019-08-29 ENCOUNTER — Encounter: Payer: Self-pay | Admitting: Family Medicine

## 2019-08-31 ENCOUNTER — Encounter: Payer: Self-pay | Admitting: Family Medicine

## 2019-08-31 ENCOUNTER — Other Ambulatory Visit: Payer: Self-pay

## 2019-08-31 ENCOUNTER — Ambulatory Visit (INDEPENDENT_AMBULATORY_CARE_PROVIDER_SITE_OTHER): Payer: Medicare PPO | Admitting: Cardiovascular Disease

## 2019-08-31 VITALS — BP 133/79 | HR 76 | Ht 69.0 in | Wt 166.0 lb

## 2019-08-31 DIAGNOSIS — I4821 Permanent atrial fibrillation: Secondary | ICD-10-CM | POA: Diagnosis not present

## 2019-08-31 DIAGNOSIS — I428 Other cardiomyopathies: Secondary | ICD-10-CM | POA: Diagnosis not present

## 2019-08-31 DIAGNOSIS — R55 Syncope and collapse: Secondary | ICD-10-CM

## 2019-08-31 DIAGNOSIS — G4733 Obstructive sleep apnea (adult) (pediatric): Secondary | ICD-10-CM

## 2019-08-31 DIAGNOSIS — I10 Essential (primary) hypertension: Secondary | ICD-10-CM

## 2019-08-31 NOTE — Patient Instructions (Signed)

## 2019-08-31 NOTE — Progress Notes (Signed)
Patient ID: Wayne Molina, male   DOB: July 27, 1956, 63 y.o.   MRN: [id number]    Cardiology Office Note    Date:  09/03/2019   ID:  Wayne Molina, DOB [birth year], MRN [id number]  PCP:  Carney Living, MD  Cardiologist:   Thurmon Fair, MD   Chief Complaint  Patient presents with  . Atrial Fibrillation    History of Present Illness:  Wayne Molina is a 63 y.o. male with long-standing permanent atrial fibrillation with spontaneously controlled ventricular response and a single episode of syncope that occurred more than 3 years ago. The loop recorder was implanted in April 2017 and has not shown an abnormality that could be clearly associated with syncope. He has a relatively frequent episodes of 3-4 second pauses during atrial fibrillation, but these are consistently nocturnal and always asymptomatic; longer pauses have not been seen. His device reached RRT a few days ago on 08/20/2019.  Continues to exercise regularly, without CV complaints. The patient specifically denies any chest pain at rest or with exertion, dyspnea at rest or with exertion, orthopnea, paroxysmal nocturnal dyspnea, syncope, palpitations, focal neurological deficits, intermittent claudication, lower extremity edema, unexplained weight gain, cough, hemoptysis or wheezing.  He recently turned 63. Biologically, he appears younger than his stated age, but this now makes his CHADSVasc score 2 (age, HTN). He has not had any embolic events, stroke or TIA.  He has not had syncope since the loop recorder was implanted. He has spontaneously rate controlled AFib and frequent 3-4" nocturnal pauses, never symptomatic. He easily achieves RVr during exercise.  Although he does not wear CPAP, he denies any problems with daytime hypersomnolence. He is at optimal weight, BMI 24.  [name] has permanent atrial fibrillation with spontaneously controlled ventricular response. He has long had an abnormal ECG suggestive of hypertrophic  cardiomyopathy but without evidence of any structural abnormalities of the left ventricle by conventional imaging methods. He has biatrial dilatation by echocardiography. He has normal left ventricular systolic function. He has mild to moderate mitral and tricuspid insufficiency, unlikely to have a causal relationship with his atrial fibrillation. He has previously been diagnosed with sleep apnea but has been unable to tolerate CPAP. He does not have any symptoms of hypersomnolence. He has not had any focal neurological events and has never had embolic complications from his chronic atrial fibrillation. He has normal left ventricular systolic function and had a normal nuclear stress test in the past.  Past Medical History:  Diagnosis Date  . Anxiety   . Arthritis    fingers  . Chronic atrial fibrillation (HCC)   . Depression   . Family history of malignant neoplasm of gastrointestinal tract   . Hyperlipidemia   . Hypertension   . Personal history of colonic polyps 11/12/2006  . Sleep apnea     Past Surgical History:  Procedure Laterality Date  . 2-D echocardiogram  07/22/2010   Ejection fraction greater than 55%. Left atrium moderately dilated. Right atrium moderately dilated. 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 Mild to Moderate MR. Mild to moderate TR.  . ATRIAL ABLATION SURGERY  2004  . EP IMPLANTABLE DEVICE N/A 02/11/2016   Procedure: Loop Recorder Insertion;  Surgeon: Thurmon Fair, MD;  [facility];  Service: Cardiovascular;  Laterality: N/A;  . Exercise Myoview stress test  07/08/2000   Nonischemic low-risk.  Marland Kitchen HIP FRACTURE SURGERY  1970s  . IR GENERIC HISTORICAL  01/21/2017   IR RADIOLOGIST EVAL & MGMT 01/21/2017 [facility]  Outpatient Medications Prior to Visit  Medication Sig Dispense Refill  . alendronate (FOSAMAX) 70 MG tablet TAKE 1 TABLET BY MOUTH EVERY 7 DAYS. TAKE WITH A FULL GLASS OF WATER ON AN EMPTY STOMACH. 12 tablet 3  . aspirin 81 MG tablet Take 81  mg by mouth daily.      Marland Kitchen. atorvastatin (LIPITOR) 40 MG tablet Take 1 tablet (40 mg total) by mouth daily. 90 tablet 3  . calcium carbonate (OSCAL) 1500 (600 Ca) MG TABS tablet Take by mouth 2 (two) times daily with a meal.    . ibuprofen (ADVIL,MOTRIN) 800 MG tablet Take 800 mg by mouth every 8 (eight) hours as needed for mild pain or moderate pain.    Marland Kitchen. losartan (COZAAR) 50 MG tablet TAKE 2 TABLETS (100 MG TOTAL) BY MOUTH DAILY. 180 tablet 0  . Magnesium 250 MG TABS Take 1 tablet by mouth daily.    . MULTIPLE VITAMIN PO Take 1 tablet by mouth daily.    . Probiotic Product (PROBIOTIC DAILY PO) Take 46 mg by mouth. [employer] probiotic 10    . sildenafil (VIAGRA) 50 MG tablet Take 1 tablet (50 mg total) by mouth daily as needed for erectile dysfunction. (Patient not taking: Reported on 05/24/2019) 10 tablet 2   No facility-administered medications prior to visit.      Allergies:   Patient has no known allergies.   Social History   Socioeconomic History  . Marital status: Divorced    Spouse name: Not on file  . Number of children: Not on file  . Years of education: Not on file  . Highest education level: Not on file  Occupational History  . Not on file  Social Needs  . Financial resource strain: Not on file  . Food insecurity    Worry: Not on file    Inability: Not on file  . Transportation needs    Medical: Not on file    Non-medical: Not on file  Tobacco Use  . Smoking status: Never Smoker  . Smokeless tobacco: Never Used  Substance and Sexual Activity  . Alcohol use: No  . Drug use: No  . Sexual activity: Yes  Lifestyle  . Physical activity    Days per week: Not on file    Minutes per session: Not on file  . Stress: Not on file  Relationships  . Social Musicianconnections    Talks on phone: Not on file    Gets together: Not on file    Attends religious service: Not on file    Active member of club or organization: Not on file    Attends meetings of clubs or organizations: Not  on file    Relationship status: Not on file  Other Topics Concern  . Not on file  Social History Narrative   Single has girlfriend.  Works at [employer]     Family History:  The patient's family history includes Colon cancer in his maternal uncle and another family member; Heart disease in his father; Stomach cancer in an other family member.   ROS:   Please see the history of present illness.    ROS All other systems are reviewed and are negative.   PHYSICAL EXAM:   VS:  BP 133/79   Pulse 76   Ht 5\' 9"  (1.753 m)   Wt 166 lb (75.3 kg)   BMI 24.51 kg/m     General: Alert, oriented x3, no distress, appears fit and younger than stated age Head:  no evidence of trauma, PERRL, EOMI, no exophtalmos or lid lag, no myxedema, no xanthelasma; normal ears, nose and oropharynx Neck: normal jugular venous pulsations and no hepatojugular reflux; brisk carotid pulses without delay and no carotid bruits Chest: clear to auscultation, no signs of consolidation by percussion or palpation, normal fremitus, symmetrical and full respiratory excursions Cardiovascular: normal position and quality of the apical impulse, irregular rhythm, normal first and second heart sounds, no murmurs, rubs or gallops Abdomen: no tenderness or distention, no masses by palpation, no abnormal pulsatility or arterial bruits, normal bowel sounds, no hepatosplenomegaly Extremities: no clubbing, cyanosis or edema; 2+ radial, ulnar and brachial pulses bilaterally; 2+ right femoral, posterior tibial and dorsalis pedis pulses; 2+ left femoral, posterior tibial and dorsalis pedis pulses; no subclavian or femoral bruits Neurological: grossly nonfocal Psych: Normal mood and affect   Wt Readings from Last 3 Encounters:  08/31/19 166 lb (75.3 kg)  07/10/19 168 lb 3.2 oz (76.3 kg)  05/24/19 168 lb (76.2 kg)      Studies/Labs Reviewed:   EKG:  EKG is ordered today and is similar to previous tracings, showing atrial  fibrillation, LVH, T wave inversion V3-V6, QTc 474 ms. Recent Labs: 05/24/2019: BUN 17; Creatinine, Ser 1.22; Potassium 4.6; Sodium 138 06/23/2019: ALT 24   Lipid Panel    Component Value Date/Time   CHOL 137 05/24/2019 0913   TRIG 82 05/24/2019 0913   HDL 46 05/24/2019 0913   CHOLHDL 3.0 05/24/2019 0913   CHOLHDL 4.1 12/06/2013 1112   VLDL 14 12/06/2013 1112   LDLCALC 75 05/24/2019 0913    ASSESSMENT:    1. Permanent atrial fibrillation ([place])   2. Nonischemic cardiomyopathy ([place])   3. Syncope and collapse   4. OSA (obstructive sleep apnea)   5. Essential hypertension      PLAN:  In order of problems listed above:  1. A. Fib: Not requiring rate control medications, suggesting that he has significant AV node disease. Asymptomatic.  As before, we discussed the fact that eventually he will probably need a pacemaker.  We have discussed in the past that this might eventually need pacemaker implantation. Now officially at higher embolic risk, CHADSVasc score 2 and meets criteria for full dose anticoagulation. He needs more time to switch from aspirin to DOAC, does not want to do it today. He understands that the stroke risk increases with advancing age. 2. Cardiomyopathy: His EKG suggests apical variant hypertrophic cardiomyopathy, but his echo has never found convincing evidence of this diagnosis. His EKG has been quite abnormal for years.  No ventricular arrhythmia has ever been detected clinically in 3 years of loop recorder monitoring.  He does not have any signs or symptoms of congestive heart failure. 3. Syncope: a single episode in 2017, no recurrence and no diagnostic findings on ILR. 4. OSA: Asymptomatic. It is possible that his nocturnal pauses are related to obstructive sleep apnea.  He reports trying numerous different CPAP masks and devices unsuccessfully.  A jaw advancement device would not work since he is edentulous and wears dentures. 5. HTN: Acceptable control.     Medication Adjustments/Labs and Tests Ordered: Current medicines are reviewed at length with the patient today.  Concerns regarding medicines are outlined above.  Medication changes, Labs and Tests ordered today are listed in the Patient Instructions below. Patient Instructions  Medication Instructions:  No changes *If you need a refill on your cardiac medications before your next appointment, please call your pharmacy*  Lab Work: None ordered If  you have labs (blood work) drawn today and your tests are completely normal, you will receive your results only by: Marland Kitchen MyChart Message (if you have MyChart) OR . A paper copy in the mail If you have any lab test that is abnormal or we need to change your treatment, we will call you to review the results.  Testing/Procedures: None ordered  Follow-Up: At [facility], you and your health needs are our priority.  As part of our continuing mission to provide you with exceptional heart care, we have created designated Provider Care Teams.  These Care Teams include your primary Cardiologist (physician) and Advanced Practice Providers (APPs -  Physician Assistants and Nurse Practitioners) who all work together to provide you with the care you need, when you need it.  Your next appointment:   12 months  The format for your next appointment:   In Person  Provider:   Thurmon Fair, MD        Signed, Thurmon Fair, MD  09/03/2019 7:24 PM    [facility] [address] [place], [place], [place]  [address] Phone: [phone]; Fax: [phone]

## 2019-09-01 NOTE — Progress Notes (Signed)
Carelink Summary Report / Loop Recorder 

## 2019-09-03 ENCOUNTER — Encounter: Payer: Self-pay | Admitting: Cardiovascular Disease

## 2019-09-11 MED FILL — ALENDRONATE NA 70 MG TAB: 70 | 28 days supply | Qty: 4 | Fill #4

## 2019-09-20 MED FILL — LOSARTAN POTASSIUM 50 MG TA: 50 | 30 days supply | Qty: 60 | Fill #2

## 2019-09-22 MED FILL — ATORVASTATIN 40 MG TABLET: 40 | 30 days supply | Qty: 30 | Fill #4

## 2019-10-09 MED FILL — ALENDRONATE NA 70 MG TAB: 70 | 28 days supply | Qty: 4 | Fill #5

## 2019-10-27 ENCOUNTER — Other Ambulatory Visit: Payer: Self-pay | Admitting: Cardiovascular Disease

## 2019-10-27 MED FILL — ATORVASTATIN 40 MG TABLET: 40 | 30 days supply | Qty: 30 | Fill #5

## 2019-10-30 MED FILL — LOSARTAN POTASSIUM 50 MG TA: 50 | 90 days supply | Qty: 180 | Fill #0

## 2019-10-30 NOTE — Telephone Encounter (Signed)
Rx(s) sent to pharmacy electronically.  

## 2019-11-06 ENCOUNTER — Other Ambulatory Visit: Payer: Self-pay | Admitting: Family Medicine

## 2019-11-06 MED FILL — ALENDRONATE NA 70 MG TAB: 70 | 28 days supply | Qty: 4 | Fill #0

## 2019-11-20 ENCOUNTER — Other Ambulatory Visit: Payer: Self-pay

## 2019-11-20 ENCOUNTER — Encounter (HOSPITAL_COMMUNITY): Payer: Self-pay

## 2019-11-20 ENCOUNTER — Ambulatory Visit (INDEPENDENT_AMBULATORY_CARE_PROVIDER_SITE_OTHER): Payer: Medicare PPO

## 2019-11-20 ENCOUNTER — Ambulatory Visit (HOSPITAL_COMMUNITY)
Admission: EM | Admit: 2019-11-20 | Discharge: 2019-11-20 | Disposition: A | Discharge status: Home / Self Care | Payer: Medicare PPO | Attending: Family Medicine | Admitting: Family Medicine

## 2019-11-20 DIAGNOSIS — S99922A Unspecified injury of left foot, initial encounter: Secondary | ICD-10-CM | POA: Diagnosis not present

## 2019-11-20 DIAGNOSIS — S93602A Unspecified sprain of left foot, initial encounter: Secondary | ICD-10-CM

## 2019-11-20 DIAGNOSIS — I10 Essential (primary) hypertension: Secondary | ICD-10-CM

## 2019-11-20 DIAGNOSIS — M79672 Pain in left foot: Secondary | ICD-10-CM | POA: Diagnosis not present

## 2019-11-20 MED ORDER — PREDNISONE 20 MG PO TABS: ORAL_TABLET | ORAL | 0 refills | Status: DC

## 2019-11-20 MED FILL — predniSONE 20 MG TABS: 20 | 5 days supply | Qty: 10 | Fill #0

## 2019-11-20 NOTE — ED Provider Notes (Signed)
Pt presents with left foot and ankle pain that started on Thursday; pt states about a year ago he had a tendon injury on that side. [facility]    CSN: [id number] Arrival date & time: 11/20/19  3818      History   Chief Complaint Chief Complaint  Patient presents with  . Foot Pain    HPI Wayne Molina is a 64 y.o. male.   Initial [facility] visit  Pt presents with left foot and ankle pain that started on Thursday; pt states about a year ago he had a tendon injury on that side.  Patient indicates that the pain is at the base of the fifth metatarsal, plantar surface.  He did not hear a pop.  He has had some swelling and is tried some ibuprofen.  Has not allowed him to do his job well.  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     Past Medical History:  Diagnosis Date  . Anxiety   . Arthritis    fingers  . Chronic atrial fibrillation (HCC)   . Depression   . Family history of malignant neoplasm of gastrointestinal tract   . Hyperlipidemia   . Hypertension   . Personal history of colonic polyps 11/12/2006  . Sleep apnea     Patient Active Problem List   Diagnosis Date Noted  . Elevated bilirubin 05/26/2019  . Lower urinary tract symptoms (LUTS) 05/24/2019  . Osteoporosis 01/27/2017  . Erectile dysfunction 01/29/2016  . Syncope 01/01/2016  . Lumbar vertebral fracture (HCC) 07/22/2015  . Hyperlipidemia 07/22/2015  . Myocardiopathy (HCC) 07/11/2013  . Essential hypertension 05/23/2012  . Sleep apnea: unable to tolerate CPAP 03/04/2011  . Atrial fibrillation (HCC) 01/06/2007    Past Surgical History:  Procedure Laterality Date  . 2-D echocardiogram  07/22/2010   Ejection fraction greater than 55%. Left atrium moderately dilated. Right atrium moderately dilated. 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 Mild to Moderate MR. Mild to moderate TR.  . ATRIAL ABLATION SURGERY  2004  . EP IMPLANTABLE DEVICE N/A 02/11/2016   Procedure: Loop  Recorder Insertion;  Surgeon: Thurmon Fair, MD;  [facility];  Service: Cardiovascular;  Laterality: N/A;  . Exercise Myoview stress test  07/08/2000   Nonischemic low-risk.  Marland Kitchen HIP FRACTURE SURGERY  1970s  . IR GENERIC HISTORICAL  01/21/2017   IR RADIOLOGIST EVAL & MGMT 01/21/2017 [facility]       Home Medications    Prior to Admission medications   Medication Sig Start Date End Date Taking? Authorizing Provider  alendronate (FOSAMAX) 70 MG tablet TAKE 1 TABLET BY MOUTH EVERY 7 DAYS. TAKE WITH A FULL GLASS OF WATER ON AN EMPTY STOMACH. 11/06/19   Carney Living, MD  aspirin 81 MG tablet Take 81 mg by mouth daily.      [provider]  atorvastatin (LIPITOR) 40 MG tablet Take 1 tablet (40 mg total) by mouth daily. 02/01/19   Carney Living, MD  calcium carbonate (OSCAL) 1500 (600 Ca) MG TABS tablet Take by mouth 2 (two) times daily with a meal.    [provider]  ibuprofen (ADVIL,MOTRIN) 800 MG tablet Take 800 mg by mouth every 8 (eight) hours as needed for mild pain or moderate pain.    [provider]  losartan (COZAAR) 50 MG tablet Take 2 tablets (100 mg total) by mouth daily. 10/30/19   Croitoru, Rachelle Hora, MD  Magnesium 250 MG TABS Take  1 tablet by mouth daily.    [provider]  MULTIPLE VITAMIN PO Take 1 tablet by mouth daily.    [provider]  predniSONE (DELTASONE) 20 MG tablet Two daily with food 11/20/19   Robyn Haber, MD  Probiotic Product (PROBIOTIC DAILY PO) Take 46 mg by mouth. [employer] probiotic 10    [provider]  sildenafil (VIAGRA) 50 MG tablet Take 1 tablet (50 mg total) by mouth daily as needed for erectile dysfunction. Patient not taking: Reported on 05/24/2019 01/29/16   Croitoru, Dani Gobble, MD    Family History Family History  Problem Relation Age of Onset  . Colon cancer Other        Great grandmother  . Heart disease Father   . Colon cancer Maternal Uncle   . Stomach  cancer Other        niece in her 62's    Social History Social History   Tobacco Use  . Smoking status: Never Smoker  . Smokeless tobacco: Never Used  Substance Use Topics  . Alcohol use: No  . Drug use: No     Allergies   Patient has no known allergies.   Review of Systems Review of Systems  Musculoskeletal: Positive for gait problem.  All other systems reviewed and are negative.    Physical Exam Triage Vital Signs ED Triage Vitals  Enc Vitals Group     BP 11/20/19 0832 (!) 156/97     Pulse Rate 11/20/19 0832 84     Resp --      Temp 11/20/19 0832 98 F (36.7 C)     Temp Source 11/20/19 0832 Oral     SpO2 11/20/19 0832 100 %     Weight --      Height --      Head Circumference --      Peak Flow --      Pain Score 11/20/19 0831 8     Pain Loc --      Pain Edu? --      Excl. in [place]? --    No data found.  Updated Vital Signs BP (!) 156/97 (BP Location: Right Arm)   Pulse 84   Temp 98 F (36.7 C) (Oral)   SpO2 100%   Physical Exam Vitals and nursing note reviewed.  Constitutional:      General: He is not in acute distress.    Appearance: Normal appearance. He is normal weight. He is not ill-appearing.  HENT:     Head: Normocephalic.  Eyes:     Conjunctiva/sclera: Conjunctivae normal.  Cardiovascular:     Rate and Rhythm: Normal rate.  Pulmonary:     Effort: Pulmonary effort is normal.  Musculoskeletal:        General: Tenderness and signs of injury present. No deformity.     Cervical back: Normal range of motion and neck supple.  Skin:    General: Skin is warm and dry.  Neurological:     General: No focal deficit present.     Mental Status: He is alert and oriented to person, place, and time.     Gait: Gait abnormal.      UC Treatments / Results  Labs (all labs ordered are listed, but only abnormal results are displayed) Labs Reviewed - No data to display  EKG   Radiology DG Foot Complete Left  Result Date: 11/20/2019 CLINICAL  DATA:  Pain laterally following twisting injury EXAM: LEFT FOOT - COMPLETE 3+ VIEW COMPARISON:  None. FINDINGS: Frontal, oblique, and lateral views obtained. No fracture or dislocation. 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 There is no appreciable joint space narrowing or erosion. Ankle mortise appears intact. There are accessory ossicles lateral to the cuboid and proximal to the navicular. IMPRESSION: No fracture or dislocation. No appreciable joint space narrowing or erosion. 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 Electronically Signed   By: Bretta Bang III M.D.   On: 11/20/2019 09:16    Procedures Procedures (including critical care time)  Medications Ordered in UC Medications - No data to display  Initial Impression / Assessment and Plan / UC Course  I have reviewed the triage vital signs and the nursing notes.  Pertinent labs & imaging results that were available during my care of the patient were reviewed by me and considered in my medical decision making (see chart for details).    Final Clinical Impressions(s) / UC Diagnoses   Final diagnoses:  Sprain of left foot, initial encounter   Discharge Instructions   None    ED Prescriptions    Medication Sig Dispense Auth. Provider   predniSONE (DELTASONE) 20 MG tablet Two daily with food 10 tablet Elvina Sidle, MD     I have reviewed the PDMP during this encounter.   Elvina Sidle, MD 11/20/19 [id number]

## 2019-11-20 NOTE — ED Triage Notes (Signed)
Pt presents with left foot and ankle pain that started on Thursday; pt states about a year ago he had a tendon injury on that side.

## 2019-11-24 ENCOUNTER — Ambulatory Visit (HOSPITAL_COMMUNITY)
Admission: EM | Admit: 2019-11-24 | Discharge: 2019-11-24 | Disposition: A | Discharge status: Home / Self Care | Payer: Medicare PPO | Attending: Emergency Medicine | Admitting: Emergency Medicine

## 2019-11-24 ENCOUNTER — Encounter (HOSPITAL_COMMUNITY): Payer: Self-pay

## 2019-11-24 DIAGNOSIS — M79672 Pain in left foot: Secondary | ICD-10-CM | POA: Diagnosis not present

## 2019-11-24 MED ORDER — MELOXICAM 15 MG PO TABS: 15.0000 mg | ORAL_TABLET | Freq: Every day | ORAL | 0 refills | Status: DC

## 2019-11-24 MED FILL — MELOXICAM 15 MG TABLET: 15 | 20 days supply | Qty: 20 | Fill #0

## 2019-11-24 NOTE — Discharge Instructions (Addendum)
Ice, elevation, use of ace wrap for support and compression.  Walking boot for comfort as needed.  Stop ibuprofen and start daily meloxicam, take with food. May still use tylenol as needed. Drink plenty of water Please follow up either podiatry or sports medicine if symptoms persist, for further evaluation.

## 2019-11-24 NOTE — ED Provider Notes (Signed)
[facility]    CSN: [id number] Arrival date & time: 11/24/19  0809      History   Chief Complaint Chief Complaint  Patient presents with  . Follow-up  . Foot Pain    HPI Wayne Molina is a 64 y.o. male.   Wayne Molina presents with complaints of persistent and worsening left foot pain. Started 8 days ago, states he had stepped out of his car and taken a step or two and felt "like a tear" to his left foot. He had similar occur 1 year ago which had resolved. He walks a lot at his job. Walking worsens the pain. Has been putting his weight more onto his heel to help relieve pain and now heel is more sore as well as calf. He was seen here on 1/11 and given course of prednisone and he has been taking ibuprofen and tylenol as well, which have only somewhat helped with relieving pain. He does note some pain even at rest. Blister to left lateral foot now as well, he thinks from his change in gait due to pain and rubbing onto his shoe. History of gout but states it has been to his great toe in the past.    ROS per HPI, negative if not otherwise mentioned.      Past Medical History:  Diagnosis Date  . Anxiety   . Arthritis    fingers  . Chronic atrial fibrillation (HCC)   . Depression   . Family history of malignant neoplasm of gastrointestinal tract   . Hyperlipidemia   . Hypertension   . Personal history of colonic polyps 11/12/2006  . Sleep apnea     Patient Active Problem List   Diagnosis Date Noted  . Elevated bilirubin 05/26/2019  . Lower urinary tract symptoms (LUTS) 05/24/2019  . Osteoporosis 01/27/2017  . Erectile dysfunction 01/29/2016  . Syncope 01/01/2016  . Lumbar vertebral fracture (HCC) 07/22/2015  . Hyperlipidemia 07/22/2015  . Myocardiopathy (HCC) 07/11/2013  . Essential hypertension 05/23/2012  . Sleep apnea: unable to tolerate CPAP 03/04/2011  . Atrial fibrillation (HCC) 01/06/2007    Past Surgical History:  Procedure Laterality Date  .  2-D echocardiogram  07/22/2010   Ejection fraction greater than 55%. Left atrium moderately dilated. Right atrium moderately dilated. 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 Mild to Moderate MR. Mild to moderate TR.  . ATRIAL ABLATION SURGERY  2004  . EP IMPLANTABLE DEVICE N/A 02/11/2016   Procedure: Loop Recorder Insertion;  Surgeon: Thurmon Fair, MD;  Location: [facility];  Service: Cardiovascular;  Laterality: N/A;  . Exercise Myoview stress test  07/08/2000   Nonischemic low-risk.  Marland Kitchen HIP FRACTURE SURGERY  1970s  . IR GENERIC HISTORICAL  01/21/2017   IR RADIOLOGIST EVAL & MGMT 01/21/2017 [facility]       Home Medications    Prior to Admission medications   Medication Sig Start Date End Date Taking? Authorizing Provider  alendronate (FOSAMAX) 70 MG tablet TAKE 1 TABLET BY MOUTH EVERY 7 DAYS. TAKE WITH A FULL GLASS OF WATER ON AN EMPTY STOMACH. 11/06/19   Carney Living, MD  aspirin 81 MG tablet Take 81 mg by mouth daily.      [provider]  atorvastatin (LIPITOR) 40 MG tablet Take 1 tablet (40 mg total) by mouth daily. 02/01/19   Carney Living, MD  calcium carbonate (OSCAL) 1500 (600 Ca) MG TABS tablet Take by mouth 2 (two) times daily with a meal.  [provider]  ibuprofen (ADVIL,MOTRIN) 800 MG tablet Take 800 mg by mouth every 8 (eight) hours as needed for mild pain or moderate pain.    [provider]  losartan (COZAAR) 50 MG tablet Take 2 tablets (100 mg total) by mouth daily. 10/30/19   Croitoru, Mihai, MD  Magnesium 250 MG TABS Take 1 tablet by mouth daily.    [provider]  meloxicam (MOBIC) 15 MG tablet Take 1 tablet (15 mg total) by mouth daily. 11/24/19   Linus Mako B, NP  MULTIPLE VITAMIN PO Take 1 tablet by mouth daily.    [provider]  Probiotic Product (PROBIOTIC DAILY PO) Take 46 mg by mouth. [employer] probiotic 10    [provider]  sildenafil (VIAGRA) 50 MG tablet Take 1 tablet (50 mg total)  by mouth daily as needed for erectile dysfunction. Patient not taking: Reported on 05/24/2019 01/29/16   Croitoru, Rachelle Hora, MD    Family History Family History  Problem Relation Age of Onset  . Colon cancer Other        Great grandmother  . Heart disease Father   . Colon cancer Maternal Uncle   . Stomach cancer Other        niece in her 60's    Social History Social History   Tobacco Use  . Smoking status: Never Smoker  . Smokeless tobacco: Never Used  Substance Use Topics  . Alcohol use: No  . Drug use: No     Allergies   Patient has no known allergies.   Review of Systems Review of Systems   Physical Exam Triage Vital Signs ED Triage Vitals  Enc Vitals Group     BP 11/24/19 0821 (!) 158/82     Pulse Rate 11/24/19 0821 81     Resp 11/24/19 0821 17     Temp 11/24/19 0821 (!) 97.5 F (36.4 C)     Temp Source 11/24/19 0821 Oral     SpO2 11/24/19 0821 97 %     Weight --      Height --      Head Circumference --      Peak Flow --      Pain Score 11/24/19 0820 8     Pain Loc --      Pain Edu? --      Excl. in GC? --    No data found.  Updated Vital Signs BP (!) 158/82 (BP Location: Left Arm)   Pulse 81   Temp (!) 97.5 F (36.4 C) (Oral)   Resp 17   SpO2 97%    Physical Exam Constitutional:      Appearance: He is well-developed.  Cardiovascular:     Rate and Rhythm: Normal rate.     Pulses:          Dorsalis pedis pulses are 1+ on the left side.  Pulmonary:     Effort: Pulmonary effort is normal.  Musculoskeletal:       Feet:  Feet:     Left foot:     Skin integrity: Blister present.     Comments: Tenderness to left lateral foot, to distal 5th metatarsal with some redness and swelling noted; fluid filled intact blister to lateral left heel; no tenderness to heel or to plantar aspect of foot; cap refill < 2 seconds  ; negative homans sign; calf is soft without redness or warmth Skin:    General: Skin is warm and dry.  Neurological:  Mental  Status: He is alert and oriented to person, place, and time.      UC Treatments / Results  Labs (all labs ordered are listed, but only abnormal results are displayed) Labs Reviewed - No data to display  EKG   Radiology No results found.  Procedures Procedures (including critical care time)  Medications Ordered in UC Medications - No data to display  Initial Impression / Assessment and Plan / UC Course  I have reviewed the triage vital signs and the nursing notes.  Pertinent labs & imaging results that were available during my care of the patient were reviewed by me and considered in my medical decision making (see chart for details).     No specific injury to foot, but with redness swelling and pain. Strain vs gout vs overuse arthritis  Vs tendonitis considered. Normal xray on 1/11. meloxicam provided, cam walker for comfort; encouraged follow up with podiatry vs sports medicine for further evaluation and management if symptoms persist. Patient verbalized understanding and agreeable to plan.   Final Clinical Impressions(s) / UC Diagnoses   Final diagnoses:  Foot pain, left     Discharge Instructions     Ice, elevation, use of ace wrap for support and compression.  Walking boot for comfort as needed.  Stop ibuprofen and start daily meloxicam, take with food. May still use tylenol as needed. Drink plenty of water Please follow up either podiatry or sports medicine if symptoms persist, for further evaluation.     ED Prescriptions    Medication Sig Dispense Auth. Provider   meloxicam (MOBIC) 15 MG tablet Take 1 tablet (15 mg total) by mouth daily. 20 tablet Zigmund Gottron, NP     PDMP not reviewed this encounter.   Zigmund Gottron, NP 11/24/19 [phone]

## 2019-11-24 NOTE — ED Triage Notes (Signed)
Pt presents to UC for follow up. Pt states he has 8 days with left foot pain and left calf pain x 2*-3 days. Pt reports the pain is worse when he walks. Pt reports he walks constantly at his job .

## 2019-11-28 MED FILL — ATORVASTATIN 40 MG TABLET: 40 | 30 days supply | Qty: 30 | Fill #6

## 2019-11-29 ENCOUNTER — Ambulatory Visit (INDEPENDENT_AMBULATORY_CARE_PROVIDER_SITE_OTHER): Payer: Medicare PPO

## 2019-11-29 ENCOUNTER — Ambulatory Visit (INDEPENDENT_AMBULATORY_CARE_PROVIDER_SITE_OTHER): Payer: Medicare PPO | Admitting: Podiatry

## 2019-11-29 ENCOUNTER — Ambulatory Visit: Payer: Medicare PPO | Admitting: Family Medicine

## 2019-11-29 ENCOUNTER — Other Ambulatory Visit: Payer: Self-pay | Admitting: Podiatry

## 2019-11-29 ENCOUNTER — Other Ambulatory Visit: Payer: Self-pay

## 2019-11-29 ENCOUNTER — Encounter: Payer: Self-pay | Admitting: Podiatry

## 2019-11-29 DIAGNOSIS — S93602A Unspecified sprain of left foot, initial encounter: Secondary | ICD-10-CM

## 2019-11-29 DIAGNOSIS — S9032XA Contusion of left foot, initial encounter: Secondary | ICD-10-CM

## 2019-12-01 ENCOUNTER — Encounter: Payer: Self-pay | Admitting: Podiatry

## 2019-12-01 NOTE — Progress Notes (Signed)
Subjective:  Patient ID: Wayne Molina, male    DOB: 1956-01-10,  MRN: [id number]  Chief Complaint  Patient presents with  . Toe Pain    pt is here for left foot pain, pt states that his left foot has been huring for about a week and a half, pt states that he walked to his car, and felt a jolting pain in his left foot    64 y.o. male presents with the above complaint.  Patient presents with left lateral ankle pain that has been going on for about 1-1/2 weeks.  Patient states he was walking when all of a sudden he felt a shooting pain in his left ankle.  He noticed that he may have also rotated the ankle inward.  This was while he was walking to his car.  Patient has tried icing and heating as well but has not helped at all.  Patient states that he would like to know if there is anything else that could be done for this ankle sprain.  He denies any other acute complaints.  He states that he already has a cam boot at home that he has been intermittently wearing.   Review of Systems: Negative except as noted in the HPI. Denies N/V/F/Ch.  Past Medical History:  Diagnosis Date  . Anxiety   . Arthritis    fingers  . Chronic atrial fibrillation (HCC)   . Depression   . Family history of malignant neoplasm of gastrointestinal tract   . Hyperlipidemia   . Hypertension   . Personal history of colonic polyps 11/12/2006  . Sleep apnea     Current Outpatient Medications:  .  alendronate (FOSAMAX) 70 MG tablet, TAKE 1 TABLET BY MOUTH EVERY 7 DAYS. TAKE WITH A FULL GLASS OF WATER ON AN EMPTY STOMACH., Disp: 4 tablet, Rfl: 4 .  amLODipine (NORVASC) 2.5 MG tablet, , Disp: , Rfl:  .  aspirin 81 MG tablet, Take 81 mg by mouth daily.  , Disp: , Rfl:  .  atorvastatin (LIPITOR) 40 MG tablet, Take 1 tablet (40 mg total) by mouth daily., Disp: 90 tablet, Rfl: 3 .  calcium carbonate (OSCAL) 1500 (600 Ca) MG TABS tablet, Take by mouth 2 (two) times daily with a meal., Disp: , Rfl:  .  ibuprofen (ADVIL,MOTRIN)  800 MG tablet, Take 800 mg by mouth every 8 (eight) hours as needed for mild pain or moderate pain., Disp: , Rfl:  .  losartan (COZAAR) 50 MG tablet, Take 2 tablets (100 mg total) by mouth daily., Disp: 180 tablet, Rfl: 3 .  Magnesium 250 MG TABS, Take 1 tablet by mouth daily., Disp: , Rfl:  .  meloxicam (MOBIC) 15 MG tablet, Take 1 tablet (15 mg total) by mouth daily., Disp: 20 tablet, Rfl: 0 .  MULTIPLE VITAMIN PO, Take 1 tablet by mouth daily., Disp: , Rfl:  .  Probiotic Product (PROBIOTIC DAILY PO), Take 46 mg by mouth. [employer] probiotic 10, Disp: , Rfl:  .  sildenafil (VIAGRA) 50 MG tablet, Take 1 tablet (50 mg total) by mouth daily as needed for erectile dysfunction., Disp: 10 tablet, Rfl: 2  Social History   Tobacco Use  Smoking Status Never Smoker  Smokeless Tobacco Never Used    No Known Allergies Objective:  There were no vitals filed for this visit. There is no height or weight on file to calculate BMI. Constitutional Well developed. Well nourished.  Vascular Dorsalis pedis pulses palpable bilaterally. Posterior tibial pulses palpable bilaterally. Capillary  refill normal to all digits.  No cyanosis or clubbing noted. Pedal hair growth normal.  Neurologic Normal speech. Oriented to person, place, and time. Epicritic sensation to light touch grossly present bilaterally.  Dermatologic Nails well groomed and normal in appearance. No open wounds. No skin lesions.  Orthopedic:  Pain on palpation to the left lateral ATFL ligament.  Pain on inversion.  No pain on eversion of the foot.  No pain at the posterior tibial tendon the peroneal tendon and Achilles tendon.   Radiographs: Left foot 2 views: No osseous fracture noted.  No bony deformities noted.  There is multiple ossicles noted at the cuboid and at the navicular likely embedded within the respective tendons.  Clinically correlate.  No other bony abnormalities noted. Assessment:   1. Foot sprain, left, initial encounter      Plan:  Patient was evaluated and treated and all questions answered.  Left ankle sprain likely partial tear of the ATFL ligament -I explained to the patient the etiology of ankle sprain and various treatment options associated with it.  Given that patient is having moderate to severe pain and is limping while ambulating I believe he will benefit from cam boot immobilization to allow for proper healing and decrease inflammation.  Patient states understanding and he will place himself in the boot that he already has at home.  He will allow proper healing for next 4 weeks.  I will see him afterwards. -If patient signs and symptoms are improving I will transition him to ankle brace.  Return in about 4 weeks (around 12/27/2019).

## 2019-12-04 MED FILL — ALENDRONATE NA 70 MG TAB: 70 | 28 days supply | Qty: 4 | Fill #1

## 2019-12-05 NOTE — Addendum Note (Signed)
Addended by: Pearlean Brownie L on: 12/05/2019 08:42 AM   Modules accepted: Level of Service

## 2019-12-19 ENCOUNTER — Encounter: Payer: Self-pay | Admitting: Podiatry

## 2019-12-27 ENCOUNTER — Ambulatory Visit (INDEPENDENT_AMBULATORY_CARE_PROVIDER_SITE_OTHER): Payer: Medicare PPO | Admitting: Podiatry

## 2019-12-27 ENCOUNTER — Other Ambulatory Visit: Payer: Self-pay

## 2019-12-27 ENCOUNTER — Encounter: Payer: Self-pay | Admitting: Podiatry

## 2019-12-27 DIAGNOSIS — S93602A Unspecified sprain of left foot, initial encounter: Secondary | ICD-10-CM

## 2019-12-27 DIAGNOSIS — M19072 Primary osteoarthritis, left ankle and foot: Secondary | ICD-10-CM

## 2019-12-27 DIAGNOSIS — M19079 Primary osteoarthritis, unspecified ankle and foot: Secondary | ICD-10-CM

## 2019-12-28 ENCOUNTER — Encounter: Payer: Self-pay | Admitting: Podiatry

## 2019-12-28 MED ORDER — MELOXICAM 15 MG PO TABS: 15.0000 mg | ORAL_TABLET | Freq: Every day | ORAL | 0 refills | Status: DC

## 2019-12-28 MED FILL — MELOXICAM 15 MG TABLET: 15 | 20 days supply | Qty: 20 | Fill #0

## 2019-12-28 NOTE — Progress Notes (Signed)
Subjective:  Patient ID: Wayne Molina, male    DOB: [birth year],  MRN: [id number]  Chief Complaint  Patient presents with  . Foot Pain    pt is here for a f/u with ankle pain, pt states that he is still having pain, but puts pain as a 2 out of 35    64 y.o. male presents with the above complaint.  Patient presents from a left lateral chronic ankle sprain.  Patient states that he is feeling much better utilizing the boot.  He does not have any pain.  He had tried ambulating without the boot but did not have much pain.  He states the boot has been helping him tremendously.  His pain scale is 2 out of 10.  He would like to know if he could get some more meloxicam as this was helping him with some pain control as well.  He has a secondary complaint of left lateral midfoot pain.  He states that this was also going on at the same time however the other pain was little bit stronger so he was not able to feel the lateral foot pain.  He would like to know if there is anything that could be done for this.  It appears to be arthritic related pain with dull achy in nature.  He denies any other acute complaints.  He would like a steroid injection in that area to help decrease some of the pain.   Review of Systems: Negative except as noted in the HPI. Denies N/V/F/Ch.  Past Medical History:  Diagnosis Date  . Anxiety   . Arthritis    fingers  . Chronic atrial fibrillation (HCC)   . Depression   . Family history of malignant neoplasm of gastrointestinal tract   . Hyperlipidemia   . Hypertension   . Personal history of colonic polyps 11/12/2006  . Sleep apnea     Current Outpatient Medications:  .  alendronate (FOSAMAX) 70 MG tablet, TAKE 1 TABLET BY MOUTH EVERY 7 DAYS. TAKE WITH A FULL GLASS OF WATER ON AN EMPTY STOMACH., Disp: 4 tablet, Rfl: 4 .  amLODipine (NORVASC) 2.5 MG tablet, , Disp: , Rfl:  .  aspirin 81 MG tablet, Take 81 mg by mouth daily.  , Disp: , Rfl:  .  atorvastatin (LIPITOR) 40 MG  tablet, Take 1 tablet (40 mg total) by mouth daily., Disp: 90 tablet, Rfl: 3 .  calcium carbonate (OSCAL) 1500 (600 Ca) MG TABS tablet, Take by mouth 2 (two) times daily with a meal., Disp: , Rfl:  .  ibuprofen (ADVIL,MOTRIN) 800 MG tablet, Take 800 mg by mouth every 8 (eight) hours as needed for mild pain or moderate pain., Disp: , Rfl:  .  losartan (COZAAR) 50 MG tablet, Take 2 tablets (100 mg total) by mouth daily., Disp: 180 tablet, Rfl: 3 .  Magnesium 250 MG TABS, Take 1 tablet by mouth daily., Disp: , Rfl:  .  meloxicam (MOBIC) 15 MG tablet, Take 1 tablet (15 mg total) by mouth daily., Disp: 20 tablet, Rfl: 0 .  MULTIPLE VITAMIN PO, Take 1 tablet by mouth daily., Disp: , Rfl:  .  Probiotic Product (PROBIOTIC DAILY PO), Take 46 mg by mouth. [employer] probiotic 10, Disp: , Rfl:  .  sildenafil (VIAGRA) 50 MG tablet, Take 1 tablet (50 mg total) by mouth daily as needed for erectile dysfunction., Disp: 10 tablet, Rfl: 2  Social History   Tobacco Use  Smoking Status Never Smoker  Smokeless Tobacco  Never Used    No Known Allergies Objective:  There were no vitals filed for this visit. There is no height or weight on file to calculate BMI. Constitutional Well developed. Well nourished.  Vascular Dorsalis pedis pulses palpable bilaterally. Posterior tibial pulses palpable bilaterally. Capillary refill normal to all digits.  No cyanosis or clubbing noted. Pedal hair growth normal.  Neurologic Normal speech. Oriented to person, place, and time. Epicritic sensation to light touch grossly present bilaterally.  Dermatologic Nails well groomed and normal in appearance. No open wounds. No skin lesions.  Orthopedic:  No pain on palpation to the left lateral ATFL ligament.  Now pain on inversion.  No pain on eversion of the foot.  No pain at the posterior tibial tendon the peroneal tendon and Achilles tendon.  Pain on palpation to the left lateral midfoot.  No pain at the peroneal tendon or any  other structures nearby.   Radiographs: None Assessment:   1. Foot sprain, left, initial encounter   2. Arthritis of midfoot    Plan:  Patient was evaluated and treated and all questions answered.  Left ankle sprain likely partial tear of the ATFL ligament -Clinically resolved.  Upon palpation patient does not have any further pain at the ATFL region.  Given that his pain is considerably improved I believe patient can transition to regular sneakers with a Tri-Lock ankle brace.  Eventually I would like the patient to completely take the Tri-Lock ankle brace off and regular into regular sneakers. -Tri-Lock ankle brace was dispensed.  Left lateral midfoot arthritis -I explained to the patient the etiology of this pain and I believe that this pain was being masked by the above ankle sprain pain.  Once the ankle sprain pain was resolved, this pain was more noticeable.  I believe patient will benefit from a steroid injection to help decrease some of this pain.  Patient agrees with the plan and would like to proceed with a steroid injection. -A steroid injection was performed at left lateral midfoot at the point of maximal tenderness using 1% plain Lidocaine and 10 mg of Kenalog. This was well tolerated.   No follow-ups on file.

## 2019-12-29 MED FILL — ATORVASTATIN 40 MG TABLET: 40 | 30 days supply | Qty: 30 | Fill #7

## 2020-01-02 MED FILL — ALENDRONATE NA 70 MG TAB: 70 | 28 days supply | Qty: 4 | Fill #2

## 2020-01-26 ENCOUNTER — Ambulatory Visit (INDEPENDENT_AMBULATORY_CARE_PROVIDER_SITE_OTHER): Payer: Medicare PPO | Admitting: Podiatry

## 2020-01-26 ENCOUNTER — Other Ambulatory Visit: Payer: Self-pay

## 2020-01-26 ENCOUNTER — Encounter: Payer: Self-pay | Admitting: Podiatry

## 2020-01-26 DIAGNOSIS — M19072 Primary osteoarthritis, left ankle and foot: Secondary | ICD-10-CM

## 2020-01-26 DIAGNOSIS — M19079 Primary osteoarthritis, unspecified ankle and foot: Secondary | ICD-10-CM

## 2020-01-26 DIAGNOSIS — S93602A Unspecified sprain of left foot, initial encounter: Secondary | ICD-10-CM

## 2020-01-26 NOTE — Progress Notes (Signed)
Subjective:  Patient ID: Wayne Molina, male    DOB: 03-30-1956,  MRN: [id number]  Chief Complaint  Patient presents with  . Ankle Pain    pt is here for left foot ankle sprain f/u, pt states that the left ankle is doing better but states that it is still painful, pt puts pain as a 1 out of 10 on the pain scale    64 y.o. male presents with the above complaint.  Patient presents with a follow-up of left lateral chronic ankle sprain.  Patient states that he is doing much better.  Patient has left lateral midfoot near the ankle sprain.  He states that it has improved greater than 50%.  He states the injection gave him good amount of relief.  His pain scale is 1 out of 10.  Patient has been wearing the Tri-Lock ankle brace which has been helping him a lot.  He denies any other acute complaints.   Review of Systems: Negative except as noted in the HPI. Denies N/V/F/Ch.  Past Medical History:  Diagnosis Date  . Anxiety   . Arthritis    fingers  . Chronic atrial fibrillation (HCC)   . Depression   . Family history of malignant neoplasm of gastrointestinal tract   . Hyperlipidemia   . Hypertension   . Personal history of colonic polyps 11/12/2006  . Sleep apnea     Current Outpatient Medications:  .  alendronate (FOSAMAX) 70 MG tablet, TAKE 1 TABLET BY MOUTH EVERY 7 DAYS. TAKE WITH A FULL GLASS OF WATER ON AN EMPTY STOMACH., Disp: 4 tablet, Rfl: 4 .  amLODipine (NORVASC) 2.5 MG tablet, , Disp: , Rfl:  .  aspirin 81 MG tablet, Take 81 mg by mouth daily.  , Disp: , Rfl:  .  atorvastatin (LIPITOR) 40 MG tablet, Take 1 tablet (40 mg total) by mouth daily., Disp: 90 tablet, Rfl: 3 .  calcium carbonate (OSCAL) 1500 (600 Ca) MG TABS tablet, Take by mouth 2 (two) times daily with a meal., Disp: , Rfl:  .  ibuprofen (ADVIL,MOTRIN) 800 MG tablet, Take 800 mg by mouth every 8 (eight) hours as needed for mild pain or moderate pain., Disp: , Rfl:  .  losartan (COZAAR) 50 MG tablet, Take 2 tablets (100  mg total) by mouth daily., Disp: 180 tablet, Rfl: 3 .  Magnesium 250 MG TABS, Take 1 tablet by mouth daily., Disp: , Rfl:  .  meloxicam (MOBIC) 15 MG tablet, Take 1 tablet (15 mg total) by mouth daily., Disp: 20 tablet, Rfl: 0 .  MULTIPLE VITAMIN PO, Take 1 tablet by mouth daily., Disp: , Rfl:  .  Probiotic Product (PROBIOTIC DAILY PO), Take 46 mg by mouth. [employer] probiotic 10, Disp: , Rfl:  .  sildenafil (VIAGRA) 50 MG tablet, Take 1 tablet (50 mg total) by mouth daily as needed for erectile dysfunction., Disp: 10 tablet, Rfl: 2  Social History   Tobacco Use  Smoking Status Never Smoker  Smokeless Tobacco Never Used    No Known Allergies Objective:  There were no vitals filed for this visit. There is no height or weight on file to calculate BMI. Constitutional Well developed. Well nourished.  Vascular Dorsalis pedis pulses palpable bilaterally. Posterior tibial pulses palpable bilaterally. Capillary refill normal to all digits.  No cyanosis or clubbing noted. Pedal hair growth normal.  Neurologic Normal speech. Oriented to person, place, and time. Epicritic sensation to light touch grossly present bilaterally.  Dermatologic Nails well groomed and  normal in appearance. No open wounds. No skin lesions.  Orthopedic:  No pain on palpation to the left lateral ATFL ligament.  Mild pain on inversion.  No pain on eversion of the foot.  No pain at the posterior tibial tendon the peroneal tendon and Achilles tendon.  Mild pain on palpation to the left lateral midfoot.  No pain at the peroneal tendon or any other structures nearby.   Radiographs: None Assessment:   1. Arthritis of midfoot   2. Foot sprain, left, initial encounter    Plan:  Patient was evaluated and treated and all questions answered.  Left ankle sprain likely partial tear of the ATFL ligament -Resolving.  Patient has been wearing the Tri-Lock ankle brace which has been helping.  No further management for  this.  Left lateral midfoot arthritis~ second -Given the patient has improvement of the pain after undergoing first injection.  I believe patient will benefit from a second injection as he received greater than 50% improvement after the first 1.  Patient states understanding like to proceed with a steroid injection. -A steroid injection was performed at left lateral midfoot at the point of maximal tenderness using 1% plain Lidocaine and 10 mg of Kenalog. This was well tolerated.   No follow-ups on file.

## 2020-01-29 ENCOUNTER — Encounter: Payer: Self-pay | Admitting: Podiatry

## 2020-01-29 MED FILL — ATORVASTATIN 40 MG TABLET: 40 | 30 days supply | Qty: 30 | Fill #8

## 2020-01-29 MED FILL — ALENDRONATE NA 70 MG TAB: 70 | 28 days supply | Qty: 4 | Fill #3

## 2020-02-08 MED FILL — LOSARTAN POTASSIUM 50 MG TA: 50 | 90 days supply | Qty: 180 | Fill #1

## 2020-02-14 ENCOUNTER — Encounter: Payer: Self-pay | Admitting: Family Medicine

## 2020-02-26 MED FILL — ALENDRONATE NA 70 MG TAB: 70 | 28 days supply | Qty: 4 | Fill #4

## 2020-02-28 ENCOUNTER — Other Ambulatory Visit: Payer: Self-pay | Admitting: Family Medicine

## 2020-02-28 MED FILL — ATORVASTATIN 40 MG TABLET: 40 | 90 days supply | Qty: 90 | Fill #0

## 2020-03-08 ENCOUNTER — Telehealth: Payer: Self-pay | Admitting: *Deleted

## 2020-03-08 ENCOUNTER — Other Ambulatory Visit: Payer: Self-pay

## 2020-03-08 ENCOUNTER — Encounter: Payer: Self-pay | Admitting: Podiatry

## 2020-03-08 ENCOUNTER — Ambulatory Visit (INDEPENDENT_AMBULATORY_CARE_PROVIDER_SITE_OTHER): Payer: Medicare PPO | Admitting: Podiatry

## 2020-03-08 VITALS — Temp 97.4°F

## 2020-03-08 DIAGNOSIS — M19079 Primary osteoarthritis, unspecified ankle and foot: Secondary | ICD-10-CM

## 2020-03-08 DIAGNOSIS — S93602A Unspecified sprain of left foot, initial encounter: Secondary | ICD-10-CM | POA: Diagnosis not present

## 2020-03-08 DIAGNOSIS — M958 Other specified acquired deformities of musculoskeletal system: Secondary | ICD-10-CM

## 2020-03-08 NOTE — Telephone Encounter (Signed)
-----   Message from Candelaria Stagers, DPM sent at 03/08/2020 12:56 PM EDT ----- Regarding: Osteochondral lesion versus ligament tear left ankle MRI [facility],  Would you be able to order an MRI of the left ankle to rule out osteochondral lesion versus ligament tear.  Thank you

## 2020-03-08 NOTE — Telephone Encounter (Signed)
Faxed orders, clinical and demographics to [facility] for pre-cert.

## 2020-03-08 NOTE — Progress Notes (Signed)
Subjective:  Patient ID: Wayne Molina, male    DOB: 08-27-1956,  MRN: [id number]  Chief Complaint  Patient presents with  . Follow-up    L foot, lateral side and dorsal midfoot. Pt stated, "The frequency of the pain has decreased, but the level of pain is the same. Worse after work. I've been wearing the brace".    64 y.o. male presents with the above complaint.  Patient presents with a follow-up of left lateral ankle pain.  Patient states the pain of frequency has decreased but the level of pain is the same.  Is worse after work he has been wearing his brace which has not helped.  He has failed immobilization as well.  He has failed injection as well.  His pain is pitting pretty much the same.  At this point I believe patient will benefit from any MRI evaluation.  He denies any other acute complaints.  Review of Systems: Negative except as noted in the HPI. Denies N/V/F/Ch.  Past Medical History:  Diagnosis Date  . Anxiety   . Arthritis    fingers  . Chronic atrial fibrillation (HCC)   . Depression   . Family history of malignant neoplasm of gastrointestinal tract   . Hyperlipidemia   . Hypertension   . Personal history of colonic polyps 11/12/2006  . Sleep apnea     Current Outpatient Medications:  .  alendronate (FOSAMAX) 70 MG tablet, TAKE 1 TABLET BY MOUTH EVERY 7 DAYS. TAKE WITH A FULL GLASS OF WATER ON AN EMPTY STOMACH., Disp: 4 tablet, Rfl: 4 .  amLODipine (NORVASC) 2.5 MG tablet, , Disp: , Rfl:  .  aspirin 81 MG tablet, Take 81 mg by mouth daily.  , Disp: , Rfl:  .  atorvastatin (LIPITOR) 40 MG tablet, TAKE 1 TABLET (40 MG TOTAL) BY MOUTH DAILY., Disp: 90 tablet, Rfl: 2 .  calcium carbonate (OSCAL) 1500 (600 Ca) MG TABS tablet, Take by mouth 2 (two) times daily with a meal., Disp: , Rfl:  .  ibuprofen (ADVIL,MOTRIN) 800 MG tablet, Take 800 mg by mouth every 8 (eight) hours as needed for mild pain or moderate pain., Disp: , Rfl:  .  losartan (COZAAR) 50 MG tablet, Take 2  tablets (100 mg total) by mouth daily., Disp: 180 tablet, Rfl: 3 .  Magnesium 250 MG TABS, Take 1 tablet by mouth daily., Disp: , Rfl:  .  meloxicam (MOBIC) 15 MG tablet, Take 1 tablet (15 mg total) by mouth daily., Disp: 20 tablet, Rfl: 0 .  MULTIPLE VITAMIN PO, Take 1 tablet by mouth daily., Disp: , Rfl:  .  Probiotic Product (PROBIOTIC DAILY PO), Take 46 mg by mouth. [employer] probiotic 10, Disp: , Rfl:  .  sildenafil (VIAGRA) 50 MG tablet, Take 1 tablet (50 mg total) by mouth daily as needed for erectile dysfunction., Disp: 10 tablet, Rfl: 2  Social History   Tobacco Use  Smoking Status Never Smoker  Smokeless Tobacco Never Used    No Known Allergies Objective:   Vitals:   03/08/20 0922  Temp: (!) 97.4 F (36.3 C)   There is no height or weight on file to calculate BMI. Constitutional Well developed. Well nourished.  Vascular Dorsalis pedis pulses palpable bilaterally. Posterior tibial pulses palpable bilaterally. Capillary refill normal to all digits.  No cyanosis or clubbing noted. Pedal hair growth normal.  Neurologic Normal speech. Oriented to person, place, and time. Epicritic sensation to light touch grossly present bilaterally.  Dermatologic Nails well groomed  and normal in appearance. No open wounds. No skin lesions.  Orthopedic:  Pain on palpation to the left lateral ATFL ligament.  Mild pain with range of motion of the ankle joint active and passive.  Mild pain on inversion.  No pain on eversion of the foot.  No pain at the posterior tibial tendon the peroneal tendon and Achilles tendon.  Mild pain on palpation to the left lateral midfoot.  No pain at the peroneal tendon or any other structures nearby.   Radiographs: None Assessment:   1. Foot sprain, left, initial encounter   2. Arthritis of midfoot   3. Osteochondral defect of ankle    Plan:  Patient was evaluated and treated and all questions answered.  Left ankle sprain likely partial tear of the ATFL  ligament versus osteochondral lesion -It appears the patient's ankle sprain is still causing him a lot of discomfort.  The steroid injection did not help alleviate the pain.  He has been wearing his ankle brace which has not helped at all.  At this point I am worried about an osteochondral lesion, I believe patient will benefit from MRI evaluation for tearing of the ligament versus osteochondral lesion rule out.  Patient agrees with the plan would like to proceed with getting the MRI. -He will also place himself back into the cam boot to allow for proper immobilization and therefore some pain relief.  Patient states understanding and he will do so. -MRI will be scheduled of the left ankle to rule out osteochondral lesion versus ligament tear  Left lateral midfoot arthritis~ second -Patient got minimal relief with the second steroid injection.  His pain returned and is extending into the ATFL ligament listed above.  At this point aggressive immobilization will help with this.   No follow-ups on file.

## 2020-03-20 ENCOUNTER — Other Ambulatory Visit: Payer: Self-pay | Admitting: *Deleted

## 2020-03-20 DIAGNOSIS — I10 Essential (primary) hypertension: Secondary | ICD-10-CM

## 2020-03-20 DIAGNOSIS — E785 Hyperlipidemia, unspecified: Secondary | ICD-10-CM

## 2020-03-20 DIAGNOSIS — I4821 Permanent atrial fibrillation: Secondary | ICD-10-CM

## 2020-03-25 ENCOUNTER — Other Ambulatory Visit: Payer: Self-pay | Admitting: Family Medicine

## 2020-03-25 MED FILL — ALENDRONATE NA 70 MG TAB: 70 | 84 days supply | Qty: 12 | Fill #0

## 2020-03-29 ENCOUNTER — Ambulatory Visit
Admission: RE | Admit: 2020-03-29 | Discharge: 2020-03-29 | Disposition: A | Discharge status: Home / Self Care | Payer: Medicare PPO | Source: Ambulatory Visit | Attending: Podiatry | Admitting: Podiatry

## 2020-03-29 ENCOUNTER — Other Ambulatory Visit: Payer: Self-pay

## 2020-03-29 ENCOUNTER — Other Ambulatory Visit: Payer: Self-pay | Admitting: Cardiovascular Disease

## 2020-03-29 DIAGNOSIS — I4821 Permanent atrial fibrillation: Secondary | ICD-10-CM | POA: Diagnosis not present

## 2020-03-29 DIAGNOSIS — I10 Essential (primary) hypertension: Secondary | ICD-10-CM | POA: Diagnosis not present

## 2020-03-29 DIAGNOSIS — S92355A Nondisplaced fracture of fifth metatarsal bone, left foot, initial encounter for closed fracture: Secondary | ICD-10-CM | POA: Diagnosis not present

## 2020-03-29 DIAGNOSIS — E785 Hyperlipidemia, unspecified: Secondary | ICD-10-CM | POA: Diagnosis not present

## 2020-03-29 LAB — COMPREHENSIVE METABOLIC PANEL
ALT: 23 IU/L (ref 0–44)
AST: 29 IU/L (ref 0–40)
Albumin/Globulin Ratio: 1.6 (ref 1.2–2.2)
Albumin: 4.2 g/dL (ref 3.8–4.8)
Alkaline Phosphatase: 58 IU/L (ref 48–121)
BUN/Creatinine Ratio: 23 (ref 10–24)
BUN: 28 mg/dL — ABNORMAL HIGH (ref 8–27)
Bilirubin Total: 1.4 mg/dL — ABNORMAL HIGH (ref 0.0–1.2)
CO2: 21 mmol/L (ref 20–29)
Calcium: 9.6 mg/dL (ref 8.6–10.2)
Chloride: 103 mmol/L (ref 96–106)
Creatinine, Ser: 1.22 mg/dL (ref 0.76–1.27)
GFR calc Af Amer: 71 mL/min/{1.73_m2} (ref >59)
GFR calc non Af Amer: 62 mL/min/{1.73_m2} (ref >59)
Globulin, Total: 2.6 g/dL (ref 1.5–4.5)
Glucose: 92 mg/dL (ref 65–99)
Potassium: 4.6 mmol/L (ref 3.5–5.2)
Sodium: 138 mmol/L (ref 134–144)
Total Protein: 6.8 g/dL (ref 6.0–8.5)

## 2020-03-29 LAB — LIPID PANEL
Chol/HDL Ratio: 2.7 ratio (ref 0.0–5.0)
Cholesterol, Total: 116 mg/dL (ref 100–199)
HDL: 43 mg/dL (ref >39)
LDL Chol Calc (NIH): 58 mg/dL (ref 0–99)
Triglycerides: 75 mg/dL (ref 0–149)
VLDL Cholesterol Cal: 15 mg/dL (ref 5–40)

## 2020-03-29 LAB — MAGNESIUM: Magnesium: 2.4 mg/dL — ABNORMAL HIGH (ref 1.6–2.3)

## 2020-04-15 ENCOUNTER — Telehealth: Payer: Self-pay | Admitting: *Deleted

## 2020-04-15 NOTE — Telephone Encounter (Signed)
Pt states he had MRI 03/29/2020, and would like to get the results.

## 2020-04-16 NOTE — Telephone Encounter (Signed)
I spoke with Wayne Molina and he states he also received a copy of the MRI and on the bottom of the page there was the a fracture of the 5th metatarsal, and that is causing a level 3 pain in that area with walking. I reviewed the MRI and the statement was subacute and late subacute, meaning may not be contributing to the pain and was possibly old. Wayne Molina states understanding and that he does have a diagnosis of osteoporosis. I asked Wayne Molina if he would like to reschedule to discuss MRI and treatment. Wayne Molina stated he wasn't sure, but he would like to get back to his activities. I recommended Wayne Molina to make an appt in the next 2 weeks for reevaluation, 4-6 weeks should show a bone callous for fracture healing and his being in the boot would help stabilize the sprain and also the fracture for healing.

## 2020-04-16 NOTE — Telephone Encounter (Signed)
I reviewed the MRI.  It does not appear that there is anything of concern especially where he was having pain.  If he still having a lot of pain he can schedule appointment to see me.  Thank you

## 2020-05-10 ENCOUNTER — Ambulatory Visit (INDEPENDENT_AMBULATORY_CARE_PROVIDER_SITE_OTHER): Payer: Medicare PPO | Admitting: Podiatry

## 2020-05-10 ENCOUNTER — Other Ambulatory Visit: Payer: Self-pay

## 2020-05-10 DIAGNOSIS — M958 Other specified acquired deformities of musculoskeletal system: Secondary | ICD-10-CM | POA: Diagnosis not present

## 2020-05-10 DIAGNOSIS — M19079 Primary osteoarthritis, unspecified ankle and foot: Secondary | ICD-10-CM | POA: Diagnosis not present

## 2020-05-10 DIAGNOSIS — S93602A Unspecified sprain of left foot, initial encounter: Secondary | ICD-10-CM

## 2020-05-14 ENCOUNTER — Encounter: Payer: Self-pay | Admitting: Podiatry

## 2020-05-14 NOTE — Progress Notes (Signed)
Subjective:  Patient ID: Wayne Molina, male    DOB: July 23, 1956,  MRN: [id number]  Chief Complaint  Patient presents with  . Foot Pain    pt is here for left foot pain    64 y.o. male presents with the above complaint.  Patient follows up for of left lateral ankle pain.  Patient had MRI done which was negative for any kind of osteochondral lesion or any kind of tearing.  Patient pain has also clinically resolved.  Patient has been doing really well.  He has transition to regular shoes.  Review of Systems: Negative except as noted in the HPI. Denies N/V/F/Ch.  Past Medical History:  Diagnosis Date  . Anxiety   . Arthritis    fingers  . Chronic atrial fibrillation (HCC)   . Depression   . Family history of malignant neoplasm of gastrointestinal tract   . Hyperlipidemia   . Hypertension   . Personal history of colonic polyps 11/12/2006  . Sleep apnea     Current Outpatient Medications:  .  alendronate (FOSAMAX) 70 MG tablet, TAKE 1 TABLET BY MOUTH EVERY 7 DAYS. TAKE WITH A FULL GLASS OF WATER ON AN EMPTY STOMACH., Disp: 12 tablet, Rfl: 2 .  amLODipine (NORVASC) 2.5 MG tablet, , Disp: , Rfl:  .  aspirin 81 MG tablet, Take 81 mg by mouth daily.  , Disp: , Rfl:  .  atorvastatin (LIPITOR) 40 MG tablet, TAKE 1 TABLET (40 MG TOTAL) BY MOUTH DAILY., Disp: 90 tablet, Rfl: 2 .  calcium carbonate (OSCAL) 1500 (600 Ca) MG TABS tablet, Take by mouth 2 (two) times daily with a meal., Disp: , Rfl:  .  ibuprofen (ADVIL,MOTRIN) 800 MG tablet, Take 800 mg by mouth every 8 (eight) hours as needed for mild pain or moderate pain., Disp: , Rfl:  .  losartan (COZAAR) 50 MG tablet, Take 2 tablets (100 mg total) by mouth daily., Disp: 180 tablet, Rfl: 3 .  Magnesium 250 MG TABS, Take 1 tablet by mouth daily., Disp: , Rfl:  .  meloxicam (MOBIC) 15 MG tablet, Take 1 tablet (15 mg total) by mouth daily., Disp: 20 tablet, Rfl: 0 .  MULTIPLE VITAMIN PO, Take 1 tablet by mouth daily., Disp: , Rfl:  .  Probiotic  Product (PROBIOTIC DAILY PO), Take 46 mg by mouth. [employer] probiotic 10, Disp: , Rfl:  .  sildenafil (VIAGRA) 50 MG tablet, Take 1 tablet (50 mg total) by mouth daily as needed for erectile dysfunction., Disp: 10 tablet, Rfl: 2  Social History   Tobacco Use  Smoking Status Never Smoker  Smokeless Tobacco Never Used    No Known Allergies Objective:   There were no vitals filed for this visit. There is no height or weight on file to calculate BMI. Constitutional Well developed. Well nourished.  Vascular Dorsalis pedis pulses palpable bilaterally. Posterior tibial pulses palpable bilaterally. Capillary refill normal to all digits.  No cyanosis or clubbing noted. Pedal hair growth normal.  Neurologic Normal speech. Oriented to person, place, and time. Epicritic sensation to light touch grossly present bilaterally.  Dermatologic Nails well groomed and normal in appearance. No open wounds. No skin lesions.  Orthopedic:  No pain on palpation to the left lateral ATFL ligament.  No pain with range of motion of the ankle joint active and passive.  No pain on inversion.  No pain on eversion of the foot.  No pain at the posterior tibial tendon the peroneal tendon and Achilles tendon.  Nail  pain on palpation to the left lateral midfoot.  No pain at the peroneal tendon or any other structures nearby.   Radiographs: None Assessment:   1. Arthritis of midfoot   2. Osteochondral defect of ankle   3. Foot sprain, left, initial encounter    Plan:  Patient was evaluated and treated and all questions answered.  Left ankle sprain likely partial tear of the ATFL ligament versus osteochondral lesion -Resolved completely. -MRI reviewed with the patient in extensive detail all the findings were extensively discussed. -MRI findings of left fifth metatarsal base fracture was discussed with the patient.  However the fracture is nondisplaced and therefore I believe patient would not benefit from any  kind of surgical intervention.  Clinically patient does not have any pain that correlates to that area as well.  Left lateral midfoot arthritis~ second -Patient got minimal relief with the second steroid injection.  His pain returned and is extending into the ATFL ligament listed above.  At this point aggressive immobilization will help with this.   No follow-ups on file.

## 2020-05-20 MED FILL — LOSARTAN POTASSIUM 50 MG TA: 50 | 90 days supply | Qty: 180 | Fill #2

## 2020-05-22 ENCOUNTER — Other Ambulatory Visit: Payer: Self-pay | Admitting: Cardiovascular Disease

## 2020-05-23 MED FILL — SILDENAFIL CITRATE 50 MG TA: 50 | 30 days supply | Qty: 6 | Fill #0

## 2020-05-24 ENCOUNTER — Other Ambulatory Visit: Payer: Self-pay

## 2020-05-24 ENCOUNTER — Ambulatory Visit (INDEPENDENT_AMBULATORY_CARE_PROVIDER_SITE_OTHER): Payer: Medicare PPO

## 2020-05-24 VITALS — BP 136/84 | Ht 69.0 in | Wt 163.0 lb

## 2020-05-24 DIAGNOSIS — Z0000 Encounter for general adult medical examination without abnormal findings: Secondary | ICD-10-CM | POA: Diagnosis not present

## 2020-05-24 NOTE — Progress Notes (Signed)
Subjective:   Wayne Molina is a 64 y.o. male who presents for Medicare Annual/Subsequent preventive examination.  Review of Systems: Defer to PCP.  Cardiac Risk Factors include: hypertension;advanced age (>14men, >57 women)  Objective:    Vitals: BP 136/84   Ht 5\' 9"  (1.753 m)   Wt 163 lb (73.9 kg)   BMI 24.07 kg/m   Body mass index is 24.07 kg/m.  Advanced Directives 05/24/2020 07/10/2019 05/24/2019 12/18/2016 02/11/2016 01/01/2016 08/28/2015  Does Patient Have a Medical Advance Directive? No No No No No No No  Would patient like information on creating a medical advance directive? Yes (MAU/Ambulatory/Procedural Areas - Information given) No - Patient declined No - Patient declined No - Patient declined No - patient declined information No - patient declined information No - patient declined information   Tobacco Social History   Tobacco Use  Smoking Status Never Smoker  Smokeless Tobacco Never Used     Clinical Intake:  Pre-visit preparation completed: Yes  Pain Score: 2   How often do you need to have someone help you when you read instructions, pamphlets, or other written materials from your doctor or pharmacy?: 1 - Never What is the last grade level you completed in school?: GED  Interpreter Needed?: No  Past Medical History:  Diagnosis Date  . Anxiety   . Arthritis    fingers  . Chronic atrial fibrillation (HCC)   . Depression   . Family history of malignant neoplasm of gastrointestinal tract   . Hyperlipidemia   . Hypertension   . Personal history of colonic polyps 11/12/2006  . Sleep apnea    Past Surgical History:  Procedure Laterality Date  . 2-D echocardiogram  07/22/2010   Ejection fraction greater than 55%. Left atrium moderately dilated. Right atrium moderately dilated. 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 Mild to Moderate MR. Mild to moderate TR.  . ATRIAL ABLATION SURGERY  2004  . EP IMPLANTABLE DEVICE N/A 02/11/2016   Procedure: Loop Recorder Insertion;   Surgeon: 04/12/2016, MD;  Location: [facility] INVASIVE CV [facility];  Service: Cardiovascular;  Laterality: N/A;  . Exercise Myoview stress test  07/08/2000   Nonischemic low-risk.  07/10/2000 HIP FRACTURE SURGERY  1970s  . IR GENERIC HISTORICAL  01/21/2017   IR RADIOLOGIST EVAL & MGMT 01/21/2017 [facility]INTER[facility] RAD   Family History  Problem Relation Age of Onset  . Colon cancer Other        Great grandmother  . Heart disease Father   . Colon cancer Maternal Uncle   . Stomach cancer Other        niece in her 82's   Social History   Socioeconomic History  . Marital status: Divorced    Spouse name: Not on file  . Number of children: 3  . Years of education: 55  . Highest education level: Not on file  Occupational History  . Not on file  Tobacco Use  . Smoking status: Never Smoker  . Smokeless tobacco: Never Used  Substance and Sexual Activity  . Alcohol use: No  . Drug use: No  . Sexual activity: Yes  Other Topics Concern  . Not on file  Social History Narrative   Patient lives alone in [place], his daughter recently moved out.    Patient enjoys going to the gym, watching TV, and going to antique shops.    Patient is divorced with 3 children, does have a girlfriend.    Social Determinants of Health   Financial Resource Strain: Low Risk   .  Difficulty of Paying Living Expenses: Not hard at all  Food Insecurity: No Food Insecurity  . Worried About Programme researcher, broadcasting/film/video in the Last Year: Never true  . Ran Out of Food in the Last Year: Never true  Transportation Needs: No Transportation Needs  . Lack of Transportation (Medical): No  . Lack of Transportation (Non-Medical): No  Physical Activity: Sufficiently Active  . Days of Exercise per Week: 4 days  . Minutes of Exercise per Session: 120 min  Stress: No Stress Concern Present  . Feeling of Stress : Only a little  Social Connections: Socially Isolated  . Frequency of Communication with Friends and Family: More than three times a week  .  Frequency of Social Gatherings with Friends and Family: More than three times a week  . Attends Religious Services: Never  . Active Member of Clubs or Organizations: No  . Attends Banker Meetings: Never  . Marital Status: Divorced   Outpatient Encounter Medications as of 05/24/2020  Medication Sig  . alendronate (FOSAMAX) 70 MG tablet TAKE 1 TABLET BY MOUTH EVERY 7 DAYS. TAKE WITH A FULL GLASS OF WATER ON AN EMPTY STOMACH.  Marland Kitchen amLODipine (NORVASC) 2.5 MG tablet   . aspirin 81 MG tablet Take 81 mg by mouth daily.    Marland Kitchen atorvastatin (LIPITOR) 40 MG tablet TAKE 1 TABLET (40 MG TOTAL) BY MOUTH DAILY.  . calcium carbonate (OSCAL) 1500 (600 Ca) MG TABS tablet Take by mouth 2 (two) times daily with a meal.  . ibuprofen (ADVIL,MOTRIN) 800 MG tablet Take 800 mg by mouth every 8 (eight) hours as needed for mild pain or moderate pain.  Marland Kitchen losartan (COZAAR) 50 MG tablet Take 2 tablets (100 mg total) by mouth daily.  . Magnesium 250 MG TABS Take 1 tablet by mouth daily.  . meloxicam (MOBIC) 15 MG tablet Take 1 tablet (15 mg total) by mouth daily.  . MULTIPLE VITAMIN PO Take 1 tablet by mouth daily.  . sildenafil (VIAGRA) 50 MG tablet TAKE 1 TABLET BY MOUTH DAILY AS NEEDED FOR ERECTILE DYSFUNCTION.  . Probiotic Product (PROBIOTIC DAILY PO) Take 46 mg by mouth. [employer] probiotic 10 (Patient not taking: Reported on 05/24/2020)   No facility-administered encounter medications on file as of 05/24/2020.   Activities of Daily Living In your present state of health, do you have any difficulty performing the following activities: 05/24/2020  Hearing? N  Vision? N  Difficulty concentrating or making decisions? N  Walking or climbing stairs? N  Dressing or bathing? N  Doing errands, shopping? N  Preparing Food and eating ? N  Using the Toilet? N  In the past six months, have you accidently leaked urine? N  Do you have problems with loss of bowel control? N  Managing your Medications? N  Managing  your Finances? N  Housekeeping or managing your Housekeeping? N  Some recent data might be hidden   Patient Care Team: Carney Living, MD as PCP - General Croitoru, Rachelle Hora, MD as Consulting Physician (Cardiology)   Assessment:   This is a routine wellness examination for Parker.  Exercise Activities and Dietary recommendations Current Exercise Habits: Home exercise routine, Type of exercise: strength training/weights;Other - see comments (stationary bike), Time (Minutes): > 60, Frequency (Times/Week): 4, Weekly Exercise (Minutes/Week): 0, Intensity: Moderate, Exercise limited by: None identified  Goals    . Patient Stated     Maintain my level of activity.       Fall Risk Fall Risk  05/24/2020 07/10/2019 05/24/2019 12/16/2016 01/01/2016  Falls in the past year? 1 0 0 No Yes  Number falls in past yr: 0 0 0 - -  Injury with Fall? 1 - 0 - No  Risk for fall due to : Impaired balance/gait - - - -  Follow up Falls prevention discussed - - - -   Is the patient's home free of loose throw rugs in walkways, pet beds, electrical cords, etc?   yes      Grab bars in the bathroom? Yes      Handrails on the stairs?   yes      Adequate lighting?   yes  Patient rating of health (0-10):   Depression Screen PHQ 2/9 Scores 05/24/2020 07/10/2019 05/24/2019 01/27/2017  PHQ - 2 Score 0 0 0 0   Cognitive Function  6CIT Screen 05/24/2020  What Year? 0 points  What month? 0 points  What time? 0 points  Count back from 20 0 points  Months in reverse 0 points  Repeat phrase 0 points  Total Score 0   Immunization History  Administered Date(s) Administered  . Influenza,inj,Quad PF,6+ Mos 07/10/2019  . Influenza-Unspecified 08/09/2014, 08/13/2016  . PFIZER SARS-COV-2 Vaccination 11/20/2019, 12/08/2019  . Pneumococcal Polysaccharide-23 05/24/2019  . Td 05/09/2006  . Tdap 01/26/2018  . Zoster 07/18/2015  . Zoster Recombinat (Shingrix) 07/18/2015   Screening Tests Health Maintenance  Topic Date  Due  . DTAP VACCINES (1) 07/18/1956  . PNA vac Low Risk Adult (2 of 2 - PCV13) 05/23/2020  . INFLUENZA VACCINE  06/09/2020  . COLONOSCOPY  06/04/2025  . DTaP/Tdap/Td (3 - Td or Tdap) 01/27/2028  . TETANUS/TDAP  01/27/2028  . COVID-19 Vaccine  Completed  . Hepatitis C Screening  Completed   Cancer Screenings: Lung: Low Dose CT Chest recommended if Age 61-80 years, 30 pack-year currently smoking OR have quit w/in 15years. Patient does not qualify. Colorectal: UTD  Additional Screenings:  Hepatitis C Screening: Completed HIV Screening: Completed    Plan:  I have updated your chart to reflect covid vaccine completion.  PCP apt scheduled for 8/4 to discuss neck pain.  Fill out an advance directive.   I have personally reviewed and noted the following in the patient's chart:   . Medical and social history . Use of alcohol, tobacco or illicit drugs  . Current medications and supplements . Functional ability and status . Nutritional status . Physical activity . Advanced directives . List of other physicians . Hospitalizations, surgeries, and ER visits in previous 12 months . Vitals . Screenings to include cognitive, depression, and falls . Referrals and appointments  In addition, I have reviewed and discussed with patient certain preventive protocols, quality metrics, and best practice recommendations. A written personalized care plan for preventive services as well as general preventive health recommendations were provided to patient.  Steva Colder, CMA  05/28/2020

## 2020-05-28 NOTE — Patient Instructions (Addendum)
You spoke to Wayne Molina, CMA in office for your annual wellness visit.  We discussed goals: Goals    . Patient Stated     Maintain my level of activity.       We also discussed recommended health maintenance. As discussed, you are pretty much up to date with everything!   Health Maintenance  Topic Date Due  . DTAP VACCINES (1) 07/18/1956  . PNA vac Low Risk Adult (2 of 2 - PCV13) 05/23/2020  . INFLUENZA VACCINE  06/09/2020  . COLONOSCOPY  06/04/2025  . DTaP/Tdap/Td (3 - Td or Tdap) 01/27/2028  . TETANUS/TDAP  01/27/2028  . COVID-19 Vaccine  Completed  . Hepatitis C Screening  Completed   I have updated your chart to reflect covid vaccine completion.  PCP apt scheduled for 8/4 to discuss neck pain.  Fill out an advance directive.   Preventive Care 66 Years and Older, Male Preventive care refers to lifestyle choices and visits with your health care provider that can promote health and wellness. This includes:  A yearly physical exam. This is also called an annual well check.  Regular dental and eye exams.  Immunizations.  Screening for certain conditions.  Healthy lifestyle choices, such as diet and exercise. What can I expect for my preventive care visit? Physical exam Your health care provider will check:  Height and weight. These may be used to calculate body mass index (BMI), which is a measurement that tells if you are at a healthy weight.  Heart rate and blood pressure.  Your skin for abnormal spots. Counseling Your health care provider may ask you questions about:  Alcohol, tobacco, and drug use.  Emotional well-being.  Home and relationship well-being.  Sexual activity.  Eating habits.  History of falls.  Memory and ability to understand (cognition).  Work and work Statistician. What immunizations do I need?  Influenza (flu) vaccine  This is recommended every year. Tetanus, diphtheria, and pertussis (Tdap) vaccine  You may need a Td  booster every 10 years. Varicella (chickenpox) vaccine  You may need this vaccine if you have not already been vaccinated. Zoster (shingles) vaccine  You may need this after age 66. Pneumococcal conjugate (PCV13) vaccine  One dose is recommended after age 66. Pneumococcal polysaccharide (PPSV23) vaccine  One dose is recommended after age 66. Measles, mumps, and rubella (MMR) vaccine  You may need at least one dose of MMR if you were born in 1957 or later. You may also need a second dose. Meningococcal conjugate (MenACWY) vaccine  You may need this if you have certain conditions. Hepatitis A vaccine  You may need this if you have certain conditions or if you travel or work in places where you may be exposed to hepatitis A. Hepatitis B vaccine  You may need this if you have certain conditions or if you travel or work in places where you may be exposed to hepatitis B. Haemophilus influenzae type b (Hib) vaccine  You may need this if you have certain conditions. You may receive vaccines as individual doses or as more than one vaccine together in one shot (combination vaccines). Talk with your health care provider about the risks and benefits of combination vaccines. What tests do I need? Blood tests  Lipid and cholesterol levels. These may be checked every 5 years, or more frequently depending on your overall health.  Hepatitis C test.  Hepatitis B test. Screening  Lung cancer screening. You may have this screening every year starting  at age 66 if you have a 30-pack-year history of smoking and currently smoke or have quit within the past 15 years.  Colorectal cancer screening. All adults should have this screening starting at age 66 and continuing until age 19. Your health care provider may recommend screening at age 72 if you are at increased risk. You will have tests every 1-10 years, depending on your results and the type of screening test.  Prostate cancer screening.  Recommendations will vary depending on your family history and other risks.  Diabetes screening. This is done by checking your blood sugar (glucose) after you have not eaten for a while (fasting). You may have this done every 1-3 years.  Abdominal aortic aneurysm (AAA) screening. You may need this if you are a current or former smoker.  Sexually transmitted disease (STD) testing. Follow these instructions at home: Eating and drinking  Eat a diet that includes fresh fruits and vegetables, whole grains, lean protein, and low-fat dairy products. Limit your intake of foods with high amounts of sugar, saturated fats, and salt.  Take vitamin and mineral supplements as recommended by your health care provider.  Do not drink alcohol if your health care provider tells you not to drink.  If you drink alcohol: ? Limit how much you have to 0-2 drinks a day. ? Be aware of how much alcohol is in your drink. In the [place] one drink equals one 12 oz bottle of beer (355 mL), one 5 oz glass of wine (148 mL), or one 1 oz glass of hard liquor (44 mL). Lifestyle  Take daily care of your teeth and gums.  Stay active. Exercise for at least 30 minutes on 5 or more days each week.  Do not use any products that contain nicotine or tobacco, such as cigarettes, e-cigarettes, and chewing tobacco. If you need help quitting, ask your health care provider.  If you are sexually active, practice safe sex. Use a condom or other form of protection to prevent STIs (sexually transmitted infections).  Talk with your health care provider about taking a low-dose aspirin or statin. What's next?  Visit your health care provider once a year for a well check visit.  Ask your health care provider how often you should have your eyes and teeth checked.  Stay up to date on all vaccines. This information is not intended to replace advice given to you by your health care provider. Make sure you discuss any questions you have with  your health care provider. Document Revised: 10/20/2018 Document Reviewed: 10/20/2018 Elsevier Patient Education  2020 [facility]'s number is [phone]. Please call with questions or concerns about what we discussed today.

## 2020-06-03 MED FILL — ATORVASTATIN 40 MG TABLET: 40 | 90 days supply | Qty: 90 | Fill #1

## 2020-06-07 IMAGING — DX DG FOOT COMPLETE 3+V*L*
3 series · 3 of 3 positions shown · non-contrast
Comparison: None.

CLINICAL DATA: Pain laterally following twisting injury

EXAM:
LEFT FOOT - COMPLETE 3+ VIEW

[foot ap]
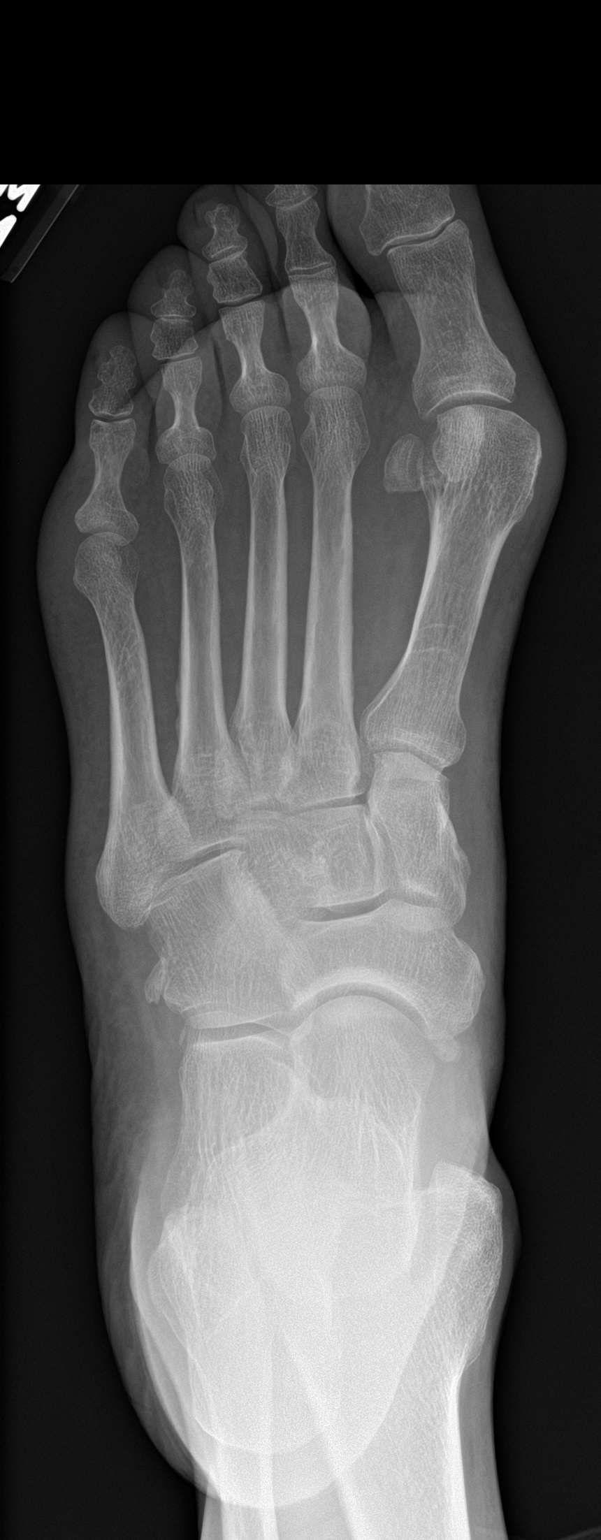

[foot obl]
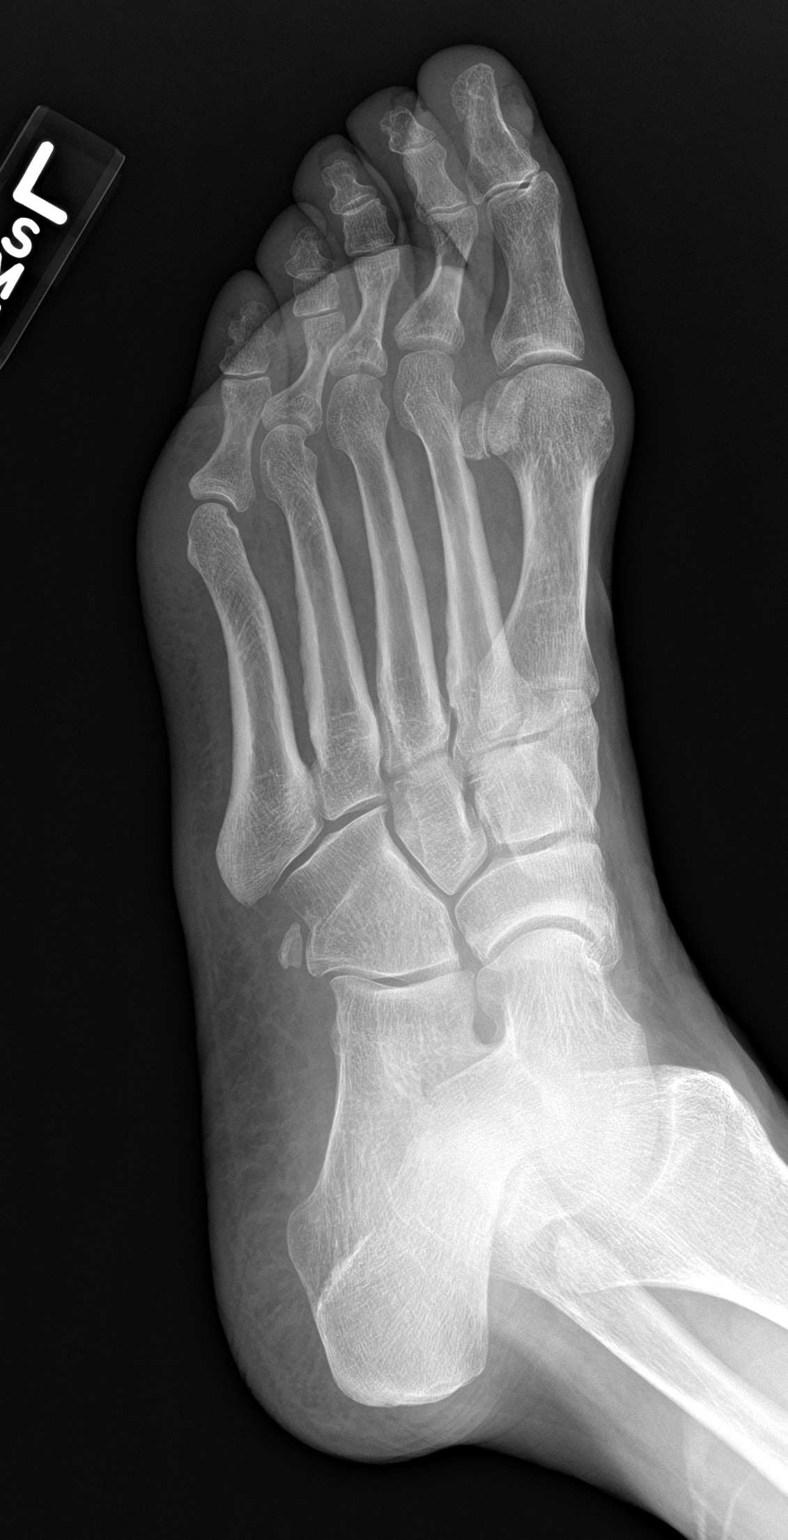

[foot lat]
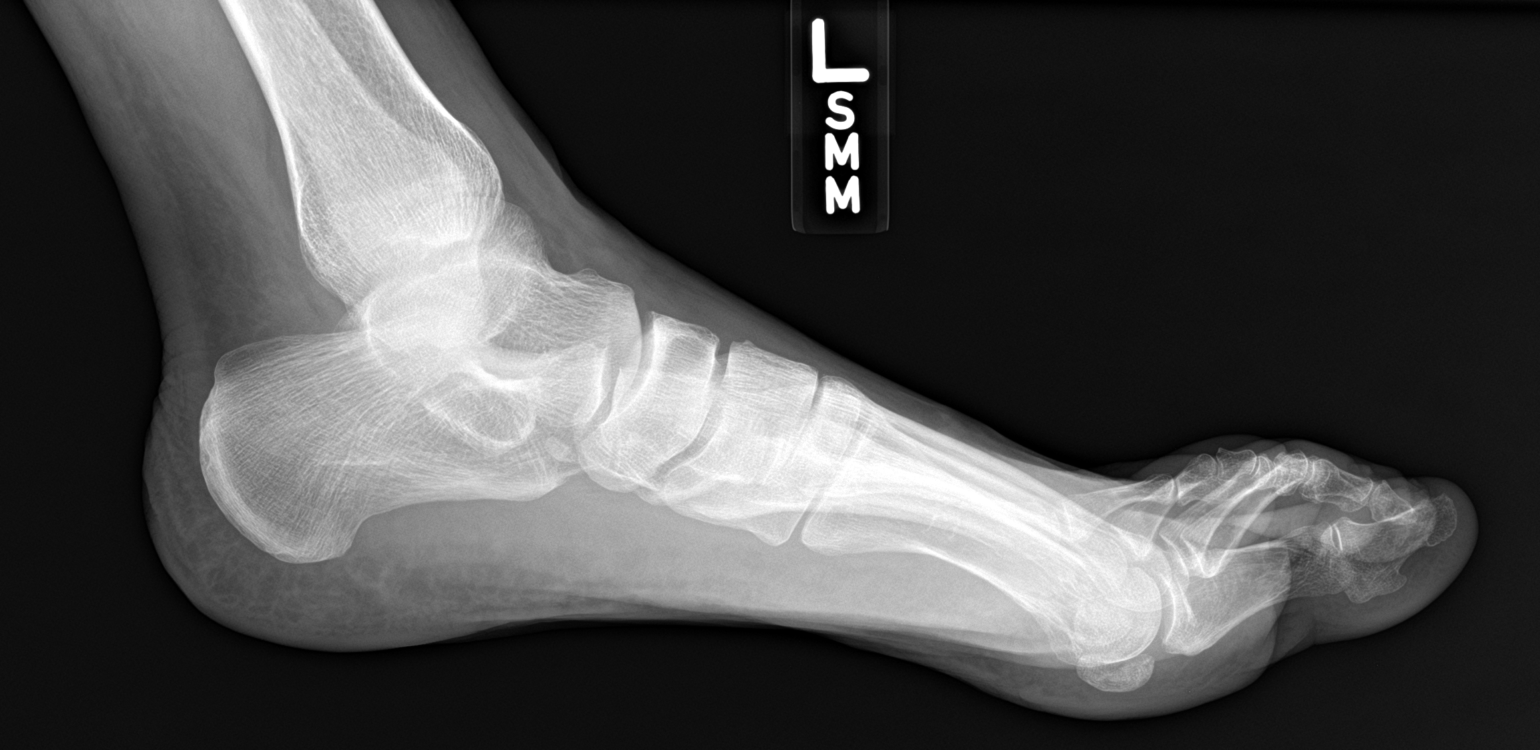

[3 of 3 positions shown; findings below may reference images not displayed]

FINDINGS: Frontal, oblique, and lateral views obtained. No fracture or
dislocation. There is hallux valgus deformity at the first MTP
joint. There is no appreciable joint space narrowing or erosion.
Ankle mortise appears intact. There are accessory ossicles lateral
to the cuboid and proximal to the navicular.
IMPRESSION: No fracture or dislocation. No appreciable joint space narrowing or
erosion. There is hallux valgus deformity at the first MTP joint.

## 2020-06-12 ENCOUNTER — Ambulatory Visit (INDEPENDENT_AMBULATORY_CARE_PROVIDER_SITE_OTHER): Payer: Medicare PPO | Admitting: Family Medicine

## 2020-06-12 ENCOUNTER — Other Ambulatory Visit: Payer: Self-pay | Admitting: Family Medicine

## 2020-06-12 ENCOUNTER — Other Ambulatory Visit: Payer: Self-pay

## 2020-06-12 ENCOUNTER — Encounter: Payer: Self-pay | Admitting: Family Medicine

## 2020-06-12 VITALS — BP 125/75 | HR 74 | Ht 69.0 in | Wt 162.2 lb

## 2020-06-12 DIAGNOSIS — Z125 Encounter for screening for malignant neoplasm of prostate: Secondary | ICD-10-CM | POA: Diagnosis not present

## 2020-06-12 DIAGNOSIS — N486 Induration penis plastica: Secondary | ICD-10-CM

## 2020-06-12 DIAGNOSIS — M542 Cervicalgia: Secondary | ICD-10-CM | POA: Diagnosis not present

## 2020-06-12 DIAGNOSIS — R399 Unspecified symptoms and signs involving the genitourinary system: Secondary | ICD-10-CM | POA: Diagnosis not present

## 2020-06-12 DIAGNOSIS — M8080XA Other osteoporosis with current pathological fracture, unspecified site, initial encounter for fracture: Secondary | ICD-10-CM | POA: Diagnosis not present

## 2020-06-12 MED ORDER — TAMSULOSIN HCL 0.4 MG PO CAPS: 0.4000 mg | ORAL_CAPSULE | Freq: Every day | ORAL | 3 refills | Status: DC

## 2020-06-12 MED FILL — TAMSULOSIN HCL 0.4 MG CAP: 0.4 | 30 days supply | Qty: 30 | Fill #0

## 2020-06-12 NOTE — Assessment & Plan Note (Signed)
Worsened.  Very consistent with BPH.  No evidence of UTI or prostatitis.  Check PSA Will trial flomax.

## 2020-06-12 NOTE — Assessment & Plan Note (Signed)
By history. No signs of lesions or cancer.   He would like to see if will improve without treatment as it apparently has in the past

## 2020-06-12 NOTE — Progress Notes (Signed)
    SUBJECTIVE:   CHIEF COMPLAINT / HPI:   NECK PAIN R sided at base of his skull for last 4-5 months.  Does not prevent him from doing anything.  No radiation or arm weakness.  No history of trauma.  Takes ibuprofen which helps.  Sometimes worse when he sits up.    SLOW URINATION Slowly progress over years.  No pain or dysuria or bleeding.  Takes a longer time to fully empty.  Never seen urology or taken any medication  PENILE DEVIATION Has noticed more curving of his penis with erections.  Had before and improved on own now worsening again.  No pain except sometimes with intercourse.  No sores or discharge   PERTINENT  PMH / PSH: Osteoporosis  OBJECTIVE:   BP 125/75   Pulse 74   Ht 5\' 9"  (1.753 m)   Wt 162 lb 3.2 oz (73.6 kg)   SpO2 99%   BMI 23.95 kg/m   Rectal: moderate size firm prostate without masses or irregularities Penis - normal skin with perhaps increased band on R side along shaft.  Nontender Neck:  No deformities, thyromegaly, masses, .   Supple with full range of motion with pain at base on R occiput with R movement.  Tender at that location with direct pressure.  No deformity or skin changes No vertebra body tenderness.  Arms - FROM normal strength   ASSESSMENT/PLAN:   Neck pain Consistent with tendon insertion pain.  Possible arthritis.   Given history of osteoporosis and age will check xray for occult fracture or tumor.    Lower urinary tract symptoms (LUTS) Worsened.  Very consistent with BPH.  No evidence of UTI or prostatitis.  Check PSA Will trial flomax.    Peyronie disease By history. No signs of lesions or cancer.   He would like to see if will improve without treatment as it apparently has in the past      , MD [facility]

## 2020-06-12 NOTE — Patient Instructions (Signed)
Good to see you today - Thanks for coming in  Neck Pain Will get xrays and contact you via My Chart Use heat and do slow range of motion exercises Try aspercreme three times a day if bothering you If worsening or any weakness or the pain is moving anywhere let me know  Urination Try flomax daily.  Be careful when first start to make sure your blood pressure does not go down   Please always bring your medication bottles  Come back to see in 6 months or sooner if things are not improving

## 2020-06-12 NOTE — Assessment & Plan Note (Addendum)
Consistent with tendon insertion pain.  Possible arthritis.   Given history of osteoporosis and age will check xray for occult fracture or tumor.

## 2020-06-13 LAB — PSA: Prostate Specific Ag, Serum: 0.6 ng/mL (ref 0.0–4.0)

## 2020-06-25 MED FILL — ALENDRONATE NA 70 MG TAB: 70 | 84 days supply | Qty: 12 | Fill #1

## 2020-07-05 ENCOUNTER — Other Ambulatory Visit: Payer: Self-pay

## 2020-07-06 MED ORDER — ALENDRONATE SODIUM 70 MG PO TABS: 70.0000 mg | ORAL_TABLET | ORAL | 2 refills | Status: DC

## 2020-07-09 ENCOUNTER — Other Ambulatory Visit: Payer: Self-pay | Admitting: *Deleted

## 2020-07-10 ENCOUNTER — Other Ambulatory Visit: Payer: Self-pay | Admitting: Family Medicine

## 2020-07-10 MED ORDER — ALENDRONATE SODIUM 70 MG PO TABS: 70.0000 mg | ORAL_TABLET | ORAL | 2 refills | Status: DC

## 2020-07-12 ENCOUNTER — Telehealth: Payer: Self-pay

## 2020-07-12 NOTE — Telephone Encounter (Signed)
Received phone call from [place] at [facility] pharmacy regarding rx request for Alendronate 70 mg. Upon chart review, medication was sent to the [facility] outpatient Pharmacy.   Attempted to call patient to see if he had already picked up medication. Patient did not answer, left HIPPA compliant VM for patient to return call to office.   Veronda Prude, RN

## 2020-07-29 MED FILL — TAMSULOSIN HCL 0.4 MG CAP: 0.4 | 30 days supply | Qty: 30 | Fill #1

## 2020-08-15 ENCOUNTER — Other Ambulatory Visit: Payer: Self-pay

## 2020-08-15 ENCOUNTER — Ambulatory Visit (HOSPITAL_BASED_OUTPATIENT_CLINIC_OR_DEPARTMENT_OTHER)
Admit: 2020-08-15 | Discharge: 2020-08-15 | Disposition: A | Discharge status: Home / Self Care | Payer: Medicare PPO | Attending: Internal Medicine | Admitting: Internal Medicine

## 2020-08-15 ENCOUNTER — Encounter (HOSPITAL_COMMUNITY): Payer: Self-pay

## 2020-08-15 ENCOUNTER — Ambulatory Visit (HOSPITAL_COMMUNITY)
Admission: EM | Admit: 2020-08-15 | Discharge: 2020-08-15 | Disposition: A | Discharge status: Home / Self Care | Payer: Medicare PPO | Attending: Family Medicine | Admitting: Family Medicine

## 2020-08-15 ENCOUNTER — Ambulatory Visit (INDEPENDENT_AMBULATORY_CARE_PROVIDER_SITE_OTHER): Payer: Medicare PPO

## 2020-08-15 DIAGNOSIS — M2012 Hallux valgus (acquired), left foot: Secondary | ICD-10-CM | POA: Diagnosis not present

## 2020-08-15 DIAGNOSIS — M79662 Pain in left lower leg: Secondary | ICD-10-CM

## 2020-08-15 DIAGNOSIS — M79672 Pain in left foot: Secondary | ICD-10-CM | POA: Insufficient documentation

## 2020-08-15 DIAGNOSIS — S92355A Nondisplaced fracture of fifth metatarsal bone, left foot, initial encounter for closed fracture: Secondary | ICD-10-CM | POA: Insufficient documentation

## 2020-08-15 DIAGNOSIS — M7989 Other specified soft tissue disorders: Secondary | ICD-10-CM | POA: Diagnosis not present

## 2020-08-15 DIAGNOSIS — M79609 Pain in unspecified limb: Secondary | ICD-10-CM

## 2020-08-15 NOTE — ED Provider Notes (Signed)
[facility]    CSN: [id number] Arrival date & time: 08/15/20  0802      History   Chief Complaint Chief Complaint  Patient presents with  . Foot Pain  . Leg Pain    HPI Wayne Molina is a 64 y.o. male.   Pt is a 64 year old make that presents with left foot pain. This has been ongoing waxing and waning issue since the beginning of the year.  Reporting that in January he thought he had fractured his foot but had multiple negative x-rays.  Was placed in cam walker boot at that time which helped.  Follow-up with podiatry in May where they did an MRI which showed metatarsal fracture the base of the fifth.  He continue to wear the boot until feeling better.  Never followed up with orthopedics.  For the past week he has had worsening pain to the left foot with swelling and radiation up into the calf area.  Left calf is pretty swollen compared to right.  Does a lot of walking at work.  No numbness, tingling.  Family history of DVT     Past Medical History:  Diagnosis Date  . Anxiety   . Arthritis    fingers  . Chronic atrial fibrillation (HCC)   . Depression   . Family history of malignant neoplasm of gastrointestinal tract   . Hyperlipidemia   . Hypertension   . Personal history of colonic polyps 11/12/2006  . Sleep apnea     Patient Active Problem List   Diagnosis Date Noted  . Neck pain 06/12/2020  . Peyronie disease 06/12/2020  . Elevated bilirubin 05/26/2019  . Lower urinary tract symptoms (LUTS) 05/24/2019  . Osteoporosis 01/27/2017  . Erectile dysfunction 01/29/2016  . Syncope 01/01/2016  . Lumbar vertebral fracture (HCC) 07/22/2015  . Hyperlipidemia 07/22/2015  . Myocardiopathy (HCC) 07/11/2013  . Essential hypertension 05/23/2012  . Sleep apnea: unable to tolerate CPAP 03/04/2011  . Atrial fibrillation (HCC) 01/06/2007    Past Surgical History:  Procedure Laterality Date  . 2-D echocardiogram  07/22/2010   Ejection fraction greater than 55%. Left  atrium moderately dilated. Right atrium moderately dilated. 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 Mild to Moderate MR. Mild to moderate TR.  . ATRIAL ABLATION SURGERY  2004  . EP IMPLANTABLE DEVICE N/A 02/11/2016   Procedure: Loop Recorder Insertion;  Surgeon: Thurmon Fair, MD;  [facility];  Service: Cardiovascular;  Laterality: N/A;  . Exercise Myoview stress test  07/08/2000   Nonischemic low-risk.  Marland Kitchen HIP FRACTURE SURGERY  1970s  . IR GENERIC HISTORICAL  01/21/2017   IR RADIOLOGIST EVAL & MGMT 01/21/2017 [facility]       Home Medications    Prior to Admission medications   Medication Sig Start Date End Date Taking? Authorizing Provider  alendronate (FOSAMAX) 70 MG tablet Take 1 tablet (70 mg total) by mouth once a week. Take with a full glass of water on an empty stomach. 07/10/20 10/08/20  Carney Living, MD  amLODipine (NORVASC) 2.5 MG tablet  08/23/19   [provider]  aspirin 81 MG tablet Take 81 mg by mouth daily.      [provider]  atorvastatin (LIPITOR) 40 MG tablet TAKE 1 TABLET (40 MG TOTAL) BY MOUTH DAILY. 02/28/20   Carney Living, MD  calcium carbonate (OSCAL) 1500 (600 Ca) MG TABS tablet Take by mouth 2 (two) times daily with a meal.    [provider]  ibuprofen (ADVIL,MOTRIN) 800 MG tablet Take 800 mg by mouth every 8 (eight) hours as needed for mild pain or moderate pain.    [provider]  losartan (COZAAR) 50 MG tablet Take 2 tablets (100 mg total) by mouth daily. 10/30/19   Croitoru, Mihai, MD  Magnesium 250 MG TABS Take 1 tablet by mouth daily.    [provider]  MULTIPLE VITAMIN PO Take 1 tablet by mouth daily.    [provider]  Probiotic Product (PROBIOTIC DAILY PO) Take 46 mg by mouth. [employer] probiotic 10 Patient not taking: Reported on 05/24/2020    [provider]  sildenafil (VIAGRA) 50 MG tablet TAKE 1 TABLET BY MOUTH DAILY AS NEEDED FOR ERECTILE DYSFUNCTION. 05/23/20    Croitoru, Mihai, MD  tamsulosin (FLOMAX) 0.4 MG CAPS capsule Take 1 capsule (0.4 mg total) by mouth daily. 06/12/20   Carney Living, MD    Family History Family History  Problem Relation Age of Onset  . Colon cancer Other        Great grandmother  . Heart disease Father   . Colon cancer Maternal Uncle   . Stomach cancer Other        niece in her 27's    Social History Social History   Tobacco Use  . Smoking status: Never Smoker  . Smokeless tobacco: Never Used  Substance Use Topics  . Alcohol use: No  . Drug use: No     Allergies   Patient has no known allergies.   Review of Systems Review of Systems   Physical Exam Triage Vital Signs ED Triage Vitals  Enc Vitals Group     BP 08/15/20 0833 117/77     Pulse Rate 08/15/20 0833 70     Resp 08/15/20 0833 17     Temp 08/15/20 0833 98 F (36.7 C)     Temp Source 08/15/20 0833 Oral     SpO2 08/15/20 0833 100 %     Weight --      Height --      Head Circumference --      Peak Flow --      Pain Score 08/15/20 0832 3     Pain Loc --      Pain Edu? --      Excl. in GC? --    No data found.  Updated Vital Signs BP 117/77 (BP Location: Left Arm)   Pulse 70   Temp 98 F (36.7 C) (Oral)   Resp 17   SpO2 100%   Visual Acuity Right Eye Distance:   Left Eye Distance:   Bilateral Distance:    Right Eye Near:   Left Eye Near:    Bilateral Near:     Physical Exam Vitals and nursing note reviewed.  Constitutional:      Appearance: Normal appearance.  HENT:     Head: Normocephalic and atraumatic.     Nose: Nose normal.  Eyes:     Conjunctiva/sclera: Conjunctivae normal.  Pulmonary:     Effort: Pulmonary effort is normal.  Musculoskeletal:        General: Normal range of motion.     Cervical back: Normal range of motion.       Feet:  Feet:     Comments: Pain to the left foot lateral at the base of the metatarsals. Swelling to left calf with pain more medial to the calf with some petechiae. No  specific redness or increased warmth. 2+ pedal pulse Normal  ROM  Skin:    General: Skin is warm and dry.  Neurological:     Mental Status: He is alert.  Psychiatric:        Mood and Affect: Mood normal.      UC Treatments / Results  Labs (all labs ordered are listed, but only abnormal results are displayed) Labs Reviewed - No data to display  EKG   Radiology DG Foot Complete Left  Result Date: 08/15/2020 CLINICAL DATA:  Pt presents with pain in left foot x 1 week. Pt reports the pain radiates from the left foot to the calf and left knee. Pt reports he had a foot fracture in Jan 2021 and he think the fracture did not heeled property. Pt reports h.*comment was truncated*r/o frature hx of fracture EXAM: LEFT FOOT - COMPLETE 3+ VIEW COMPARISON:  MRI 03/29/2020 FINDINGS: Transverse fracture of the base of fifth metatarsal. Fracture similar to MRI of 03/29/2020. The phalanges are normal. The calcaneus is normal. No soft tissue abnormality. Hallux valgus deformity of the first ray. IMPRESSION: 1. Remote fracture at the base of the fifth metatarsal. 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 2. No acute findings of foot. 3. Hallux valgus deformity. Electronically Signed   By: Genevive Bi M.D.   On: 08/15/2020 09:27    Procedures Procedures (including critical care time)  Medications Ordered in UC Medications - No data to display  Initial Impression / Assessment and Plan / UC Course  I have reviewed the triage vital signs and the nursing notes.  Pertinent labs & imaging results that were available during my care of the patient were reviewed by me and considered in my medical decision making (see chart for details).     Fracture of the fifth metatarsal of the base. We will have him start wearing the cam walker boot again has he was previously. Contact given for orthopedic for follow-up.  Recommended rest as output is much as possible. Ice and ibuprofen as  needed Sending for ultrasound to rule out DVT based on the calf swelling and tenderness. Final Clinical Impressions(s) / UC Diagnoses   Final diagnoses:  Nondisplaced fracture of fifth metatarsal bone, left foot, initial encounter for closed fracture  Foot pain, left  Calf swelling     Discharge Instructions     11:00 [place] [facility]  Get there by 10:45  You have a fracture of the foot.  You need to see orthopedics.  You need to stay off the foot as much as possible before then  I am going to give you some crutches.  Ibuprofen for pain as needed.      ED Prescriptions    None     PDMP not reviewed this encounter.   Dahlia Byes A, NP 08/15/20 1025

## 2020-08-15 NOTE — ED Triage Notes (Signed)
Pt presents with pain in left foot x 1 week. Pt reports the pain radiates from the left foot to the calf and left knee. Pt reports he had a foot fracture in Jan 2021 and he think the fracture did not heeled property. 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 Cam walker gives relief.

## 2020-08-15 NOTE — Discharge Instructions (Addendum)
11:00 [place] [facility]  Get there by 10:45  You have a fracture of the foot.  You need to see orthopedics.  You need to stay off the foot as much as possible before then  I am going to give you some crutches.  Ibuprofen for pain as needed.

## 2020-08-16 ENCOUNTER — Other Ambulatory Visit: Payer: Self-pay | Admitting: Sports Medicine

## 2020-08-16 DIAGNOSIS — T148XXA Other injury of unspecified body region, initial encounter: Secondary | ICD-10-CM

## 2020-08-29 ENCOUNTER — Ambulatory Visit
Admission: RE | Admit: 2020-08-29 | Discharge: 2020-08-29 | Disposition: A | Discharge status: Home / Self Care | Payer: Medicare PPO | Source: Ambulatory Visit | Attending: Sports Medicine | Admitting: Sports Medicine

## 2020-08-29 DIAGNOSIS — M2012 Hallux valgus (acquired), left foot: Secondary | ICD-10-CM | POA: Diagnosis not present

## 2020-08-29 DIAGNOSIS — M19072 Primary osteoarthritis, left ankle and foot: Secondary | ICD-10-CM | POA: Diagnosis not present

## 2020-08-29 DIAGNOSIS — S92355A Nondisplaced fracture of fifth metatarsal bone, left foot, initial encounter for closed fracture: Secondary | ICD-10-CM | POA: Diagnosis not present

## 2020-08-29 DIAGNOSIS — T148XXA Other injury of unspecified body region, initial encounter: Secondary | ICD-10-CM

## 2020-08-30 MED FILL — ATORVASTATIN 40 MG TABLET: 40 | 90 days supply | Qty: 90 | Fill #2

## 2020-08-30 MED FILL — TAMSULOSIN HCL 0.4 MG CAP: 0.4 | 30 days supply | Qty: 30 | Fill #2

## 2020-09-03 MED FILL — LOSARTAN POTASSIUM 50 MG TA: 50 | 90 days supply | Qty: 180 | Fill #3

## 2020-09-10 DIAGNOSIS — S92355D Nondisplaced fracture of fifth metatarsal bone, left foot, subsequent encounter for fracture with routine healing: Secondary | ICD-10-CM | POA: Diagnosis not present

## 2020-09-19 ENCOUNTER — Other Ambulatory Visit: Payer: Self-pay

## 2020-09-19 ENCOUNTER — Encounter: Payer: Self-pay | Admitting: Cardiovascular Disease

## 2020-09-19 ENCOUNTER — Ambulatory Visit (INDEPENDENT_AMBULATORY_CARE_PROVIDER_SITE_OTHER): Payer: Medicare PPO | Admitting: Cardiovascular Disease

## 2020-09-19 VITALS — BP 110/80 | HR 64 | Ht 69.0 in | Wt 172.2 lb

## 2020-09-19 DIAGNOSIS — Z87898 Personal history of other specified conditions: Secondary | ICD-10-CM | POA: Diagnosis not present

## 2020-09-19 DIAGNOSIS — I10 Essential (primary) hypertension: Secondary | ICD-10-CM | POA: Diagnosis not present

## 2020-09-19 DIAGNOSIS — I4821 Permanent atrial fibrillation: Secondary | ICD-10-CM | POA: Diagnosis not present

## 2020-09-19 DIAGNOSIS — I428 Other cardiomyopathies: Secondary | ICD-10-CM | POA: Diagnosis not present

## 2020-09-19 DIAGNOSIS — G4733 Obstructive sleep apnea (adult) (pediatric): Secondary | ICD-10-CM

## 2020-09-19 NOTE — Patient Instructions (Signed)

## 2020-09-20 ENCOUNTER — Encounter: Payer: Self-pay | Admitting: Cardiovascular Disease

## 2020-09-20 NOTE — Progress Notes (Signed)
Patient ID: Wayne Molina, male   DOB: 08/15/1956, 64 y.o.   MRN: [id number]    Cardiology Office Note    Date:  09/20/2020   ID:  Wayne Molina, DOB [birth year], MRN [id number]  PCP:  Wayne Living, MD  Cardiologist:   Thurmon Fair, MD   Chief Complaint  Patient presents with  . Atrial Fibrillation    History of Present Illness:  Wayne Molina is a 64 y.o. male with long-standing permanent atrial fibrillation with spontaneously controlled ventricular response and a single episode of syncope that occurred more than 3 years ago. A loop recorder was implanted in April 2017 and has not shown an abnormality that could be clearly associated with syncope. He has a relatively frequent episodes of 3-4 second pauses during atrial fibrillation, but these are consistently nocturnal and always asymptomatic; longer pauses have not been seen.  His loop recorder showed appropriate heart rate increase with physical exercise.  His device reached RRT a year ago10/09/2019.  He has not had any cardiovascular complaints over the last year, but has been struggling with a foot injury.  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  He has been wearing a surgical boot but healing has been slow.  It is possible that he will need surgery.  Wayne Molina has a history of osteopenia and has had a couple of small vertebral fractures associated with sneezing and coughing.  She is on a bisphosphonate.  He still been able to do some cardio exercises (stationary bicycle) and does a lot of upper body workouts.  He does not have exertional dyspnea or angina.  He denies orthopnea, PND, leg edema, dizziness or syncope and he has never been aware of the arrhythmia (no palpitations).  He has not had any serious falls or bleeding problems.  CHA2DS2-VASc score is 2 (age and hypertension) but he is only taking aspirin.  He has never had embolic events.  Wayne Molina has  permanent atrial fibrillation with spontaneously controlled ventricular response. He has long had an abnormal ECG suggestive of hypertrophic cardiomyopathy but without evidence of any structural abnormalities of the left ventricle by conventional imaging methods. He has biatrial dilatation by echocardiography. He has normal left ventricular systolic function. He has mild to moderate mitral and tricuspid insufficiency, unlikely to have a causal relationship with his atrial fibrillation. He has previously been diagnosed with sleep apnea but has been unable to tolerate CPAP. He does not have any symptoms of hypersomnolence. He has not had any focal neurological events and has never had embolic complications from his chronic atrial fibrillation. He has normal left ventricular systolic function and had a normal nuclear stress test in the past.  Past Medical History:  Diagnosis Date  . Anxiety   . Arthritis    fingers  . Chronic atrial fibrillation (HCC)   . Depression   . Family history of malignant neoplasm of gastrointestinal tract   . Hyperlipidemia   . Hypertension   . Personal history of colonic polyps 11/12/2006  . Sleep apnea     Past Surgical History:  Procedure Laterality Date  . 2-D echocardiogram  07/22/2010   Ejection fraction greater than 55%. Left atrium moderately dilated. Right atrium moderately dilated. 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 Mild to Moderate MR. Mild to moderate TR.  . ATRIAL ABLATION SURGERY  2004  . EP IMPLANTABLE DEVICE N/A 02/11/2016   Procedure: Loop Recorder Insertion;  Surgeon: Rachelle Hora Doristine Shehan,  MD;  [facility];  Service: Cardiovascular;  Laterality: N/A;  . Exercise Myoview stress test  07/08/2000   Nonischemic low-risk.  Marland Kitchen HIP FRACTURE SURGERY  1970s  . IR GENERIC HISTORICAL  01/21/2017   IR RADIOLOGIST EVAL & MGMT 01/21/2017 [facility]    Outpatient Medications Prior to Visit  Medication Sig Dispense Refill  . alendronate (FOSAMAX) 70 MG  tablet Take 1 tablet (70 mg total) by mouth once a week. Take with a full glass of water on an empty stomach. 12 tablet 2  . amLODipine (NORVASC) 2.5 MG tablet Take 2.5 mg by mouth daily.     Marland Kitchen aspirin 81 MG tablet Take 81 mg by mouth daily.      Marland Kitchen atorvastatin (LIPITOR) 40 MG tablet TAKE 1 TABLET (40 MG TOTAL) BY MOUTH DAILY. 90 tablet 2  . calcium carbonate (OSCAL) 1500 (600 Ca) MG TABS tablet Take by mouth 2 (two) times daily with a meal.    . ibuprofen (ADVIL,MOTRIN) 800 MG tablet Take 800 mg by mouth every 8 (eight) hours as needed for mild pain or moderate pain.    Marland Kitchen losartan (COZAAR) 50 MG tablet Take 2 tablets (100 mg total) by mouth daily. 180 tablet 3  . Magnesium 250 MG TABS Take 1 tablet by mouth daily.    . MULTIPLE VITAMIN PO Take 1 tablet by mouth daily.    . sildenafil (VIAGRA) 50 MG tablet TAKE 1 TABLET BY MOUTH DAILY AS NEEDED FOR ERECTILE DYSFUNCTION. 6 tablet 2  . tamsulosin (FLOMAX) 0.4 MG CAPS capsule Take 1 capsule (0.4 mg total) by mouth daily. 30 capsule 3  . Probiotic Product (PROBIOTIC DAILY PO) Take 46 mg by mouth. [employer] probiotic 10 (Patient not taking: Reported on 05/24/2020)     No facility-administered medications prior to visit.     Allergies:   Patient has no known allergies.   Social History   Socioeconomic History  . Marital status: Divorced    Spouse name: Not on file  . Number of children: 3  . Years of education: 51  . Highest education level: Not on file  Occupational History  . Not on file  Tobacco Use  . Smoking status: Never Smoker  . Smokeless tobacco: Never Used  Substance and Sexual Activity  . Alcohol use: No  . Drug use: No  . Sexual activity: Yes  Other Topics Concern  . Not on file  Social History Narrative   Patient lives alone in [place], his daughter recently moved out.    Patient enjoys going to the gym, watching TV, and going to antique shops.    Patient is divorced with 3 children, does have a girlfriend.    Social  Determinants of Health   Financial Resource Strain: Low Risk   . Difficulty of Paying Molina Expenses: Not hard at all  Food Insecurity: No Food Insecurity  . Worried About Programme researcher, broadcasting/film/video in the Last Year: Never true  . Ran Out of Food in the Last Year: Never true  Transportation Needs: No Transportation Needs  . Lack of Transportation (Medical): No  . Lack of Transportation (Non-Medical): No  Physical Activity: Sufficiently Active  . Days of Exercise per Week: 4 days  . Minutes of Exercise per Session: 120 min  Stress: No Stress Concern Present  . Feeling of Stress : Only a little  Social Connections: Socially Isolated  . Frequency of Communication with Friends and Family: More than three times a week  .  Frequency of Social Gatherings with Friends and Family: More than three times a week  . Attends Religious Services: Never  . Active Member of Clubs or Organizations: No  . Attends Banker Meetings: Never  . Marital Status: Divorced     Family History:  The patient's family history includes Colon cancer in his maternal uncle and another family member; Heart disease in his father; Stomach cancer in an other family member.   ROS:   Please see the history of present illness.    ROS All other systems are reviewed and are negative.   PHYSICAL EXAM:   VS:  BP 110/80 (BP Location: Left Arm, Patient Position: Sitting)   Pulse 64   Ht 5\' 9"  (1.753 m)   Wt 172 lb 3.2 oz (78.1 kg)   SpO2 98%   BMI 25.43 kg/m      General: Alert, oriented x3, no distress, appears younger than stated age Head: no evidence of trauma, PERRL, EOMI, no exophtalmos or lid lag, no myxedema, no xanthelasma; normal ears, nose and oropharynx Neck: normal jugular venous pulsations and no hepatojugular reflux; brisk carotid pulses without delay and no carotid bruits Chest: clear to auscultation, no signs of consolidation by percussion or palpation, normal fremitus, symmetrical and full  respiratory excursions Cardiovascular: normal position and quality of the apical impulse, irregular rhythm, normal first and second heart sounds, no murmurs, rubs or gallops Abdomen: no tenderness or distention, no masses by palpation, no abnormal pulsatility or arterial bruits, normal bowel sounds, no hepatosplenomegaly Extremities: no clubbing, cyanosis or edema; 2+ radial, ulnar and brachial pulses bilaterally; 2+ right femoral, posterior tibial and dorsalis pedis pulses; 2+ left femoral, posterior tibial and dorsalis pedis pulses; no subclavian or femoral bruits Neurological: grossly nonfocal Psych: Normal mood and affect    Wt Readings from Last 3 Encounters:  09/19/20 172 lb 3.2 oz (78.1 kg)  06/12/20 162 lb 3.2 oz (73.6 kg)  05/24/20 163 lb (73.9 kg)      Studies/Labs Reviewed:   EKG:  EKG is ordered today and is similar to previous tracings, it shows atrial fibrillation with controlled ventricular response and very prominent T wave version in leads V3-V6.  Normal QTC 447 ms Recent Labs: 03/29/2020: ALT 23; BUN 28; Creatinine, Ser 1.22; Magnesium 2.4; Potassium 4.6; Sodium 138   Lipid Panel    Component Value Date/Time   CHOL 116 03/29/2020 0814   TRIG 75 03/29/2020 0814   HDL 43 03/29/2020 0814   CHOLHDL 2.7 03/29/2020 0814   CHOLHDL 4.1 12/06/2013 1112   VLDL 14 12/06/2013 1112   LDLCALC 58 03/29/2020 0814    ASSESSMENT:    1. Permanent atrial fibrillation (HCC)   2. Nonischemic cardiomyopathy (HCC)   3. History of syncope   4. OSA (obstructive sleep apnea)   5. Essential hypertension      PLAN:  In order of problems listed above:  1. A. Fib: He does not require AV nodal blocking agents suggesting some underlying conduction system disease.  He understands that he might eventually require pacemaker if he develops symptomatic slow ventricular rates.  CHADSVasc score 2 and meets criteria for full dose anticoagulation, but he may require further surgery in the near  future.  He is not ready to start oral anticoagulants.  Asked him to call promptly if he develops even the briefest focal neurological complaints.  Plan to switch from aspirin to an oral anticoagulant within the next 12 months. 2. Cardiomyopathy: Asymptomatic.  Preserved left ventricular  systolic function.  His EKG suggests apical variant hypertrophic cardiomyopathy, but his echo has never found convincing evidence of this diagnosis. His EKG has been quite abnormal for years.  No ventricular arrhythmia has ever been detected clinically in 3 years of loop recorder monitoring.  He does not have any signs or symptoms of congestive heart failure. 3. Syncope: He had a single episode in 2017, but this has not recurred and he has never had evidence of an arrhythmia that could cause syncope on 3 years of implantable loop recorder monitoring. 4. OSA: Continues to be asymptomatic and denies daytime hypersomnolence.. It is possible that his nocturnal pauses are related to obstructive sleep apnea.  He reports trying numerous different CPAP masks and devices unsuccessfully.  A jaw advancement device would not work since he is edentulous and wears dentures. 5. HTN: Excellent control    Medication Adjustments/Labs and Tests Ordered: Current medicines are reviewed at length with the patient today.  Concerns regarding medicines are outlined above.  Medication changes, Labs and Tests ordered today are listed in the Patient Instructions below. Patient Instructions  Medication Instructions:  No changes *If you need a refill on your cardiac medications before your next appointment, please call your pharmacy*   Lab Work: None ordered If you have labs (blood work) drawn today and your tests are completely normal, you will receive your results only by: Marland Kitchen MyChart Message (if you have MyChart) OR . A paper copy in the mail If you have any lab test that is abnormal or we need to change your treatment, we will call you to  review the results.   Testing/Procedures: None ordered   Follow-Up: At [facility], you and your health needs are our priority.  As part of our continuing mission to provide you with exceptional heart care, we have created designated Provider Care Teams.  These Care Teams include your primary Cardiologist (physician) and Advanced Practice Providers (APPs -  Physician Assistants and Nurse Practitioners) who all work together to provide you with the care you need, when you need it.  We recommend signing up for the patient portal called "MyChart".  Sign up information is provided on this After Visit Summary.  MyChart is used to connect with patients for Virtual Visits (Telemedicine).  Patients are able to view lab/test results, encounter notes, upcoming appointments, etc.  Non-urgent messages can be sent to your provider as well.   To learn more about what you can do with MyChart, go to [email or web address].    Your next appointment:   12 month(s)  The format for your next appointment:   In Person  Provider:   You may see Thurmon Fair, MD or one of the following Advanced Practice Providers on your designated Care Team:    Azalee Course, PA-C  Micah Flesher, [place] or   Judy Pimple, PA-C        Signed, Thurmon Fair, MD  09/20/2020 7:23 AM    [facility] [address] [place], [place], [place]  [address] Phone: [phone]; Fax: [phone]

## 2020-09-23 DIAGNOSIS — S92355K Nondisplaced fracture of fifth metatarsal bone, left foot, subsequent encounter for fracture with nonunion: Secondary | ICD-10-CM | POA: Diagnosis not present

## 2020-09-24 MED FILL — ALENDRONATE NA 70 MG TAB: 70 | 84 days supply | Qty: 12 | Fill #2

## 2020-09-27 ENCOUNTER — Other Ambulatory Visit: Payer: Self-pay

## 2020-09-27 MED ORDER — ATORVASTATIN CALCIUM 40 MG PO TABS: 40.0000 mg | ORAL_TABLET | Freq: Every day | ORAL | 2 refills | Status: DC

## 2020-09-27 MED ORDER — TAMSULOSIN HCL 0.4 MG PO CAPS: 0.4000 mg | ORAL_CAPSULE | Freq: Every day | ORAL | 3 refills | Status: DC

## 2020-10-07 ENCOUNTER — Other Ambulatory Visit: Payer: Self-pay | Admitting: *Deleted

## 2020-10-07 MED ORDER — ATORVASTATIN CALCIUM 40 MG PO TABS: 40.0000 mg | ORAL_TABLET | Freq: Every day | ORAL | 2 refills | Status: DC

## 2020-10-07 MED ORDER — TAMSULOSIN HCL 0.4 MG PO CAPS: 0.4000 mg | ORAL_CAPSULE | Freq: Every day | ORAL | 3 refills | Status: DC

## 2020-11-19 DIAGNOSIS — S92355D Nondisplaced fracture of fifth metatarsal bone, left foot, subsequent encounter for fracture with routine healing: Secondary | ICD-10-CM | POA: Diagnosis not present

## 2020-11-21 DIAGNOSIS — M8589 Other specified disorders of bone density and structure, multiple sites: Secondary | ICD-10-CM | POA: Diagnosis not present

## 2020-12-13 ENCOUNTER — Other Ambulatory Visit: Payer: Self-pay | Admitting: Cardiovascular Disease

## 2020-12-13 MED FILL — LOSARTAN POTASSIUM 50 MG TA: 50 | 30 days supply | Qty: 60 | Fill #0

## 2020-12-17 ENCOUNTER — Other Ambulatory Visit: Payer: Self-pay

## 2020-12-18 MED ORDER — TAMSULOSIN HCL 0.4 MG PO CAPS: 0.4000 mg | ORAL_CAPSULE | Freq: Every day | ORAL | 0 refills | Status: DC

## 2020-12-20 MED FILL — ALENDRONATE NA 70 MG TAB: 70 | 84 days supply | Qty: 12 | Fill #0

## 2020-12-24 ENCOUNTER — Other Ambulatory Visit: Payer: Self-pay | Admitting: *Deleted

## 2020-12-24 MED ORDER — LOSARTAN POTASSIUM 50 MG PO TABS: 100.0000 mg | ORAL_TABLET | Freq: Every day | ORAL | 3 refills | Status: DC

## 2020-12-27 ENCOUNTER — Ambulatory Visit (INDEPENDENT_AMBULATORY_CARE_PROVIDER_SITE_OTHER): Payer: Medicare PPO

## 2020-12-27 ENCOUNTER — Other Ambulatory Visit: Payer: Self-pay

## 2020-12-27 ENCOUNTER — Ambulatory Visit (HOSPITAL_COMMUNITY)
Admission: EM | Admit: 2020-12-27 | Discharge: 2020-12-27 | Disposition: A | Discharge status: Home / Self Care | Payer: Medicare PPO

## 2020-12-27 DIAGNOSIS — M25562 Pain in left knee: Secondary | ICD-10-CM

## 2020-12-27 NOTE — ED Triage Notes (Signed)
Pt c/o left knee pain for past several days. Pt states he has fractures in his left foot and he has been compensating during ambulation for pain in feet, then left knee pain began.   Pt has been wearing a pull-on knee support and taking ibuprofen-last taken last night at approx midnight.  Denies numbness to feet/toes. Pt has not taken any of his BP meds today.

## 2020-12-27 NOTE — ED Provider Notes (Signed)
[facility]    CSN: [id number] Arrival date & time: 12/27/20  0800      History   Chief Complaint Chief Complaint  Patient presents with  . Knee Pain    HPI Wayne Molina is a 65 y.o. male.   The history is provided by the patient. No language interpreter was used.  Knee Pain Location:  Knee Injury: no   Knee location:  L knee Pain details:    Quality:  Aching   Radiates to:  Does not radiate   Severity:  Moderate   Onset quality:  Gradual   Progression:  Worsening Chronicity:  New Dislocation: no   Prior injury to area:  No Relieved by:  Nothing Worsened by:  Nothing Ineffective treatments:  None tried Associated symptoms: no decreased ROM     Past Medical History:  Diagnosis Date  . Anxiety   . Arthritis    fingers  . Chronic atrial fibrillation (HCC)   . Depression   . Family history of malignant neoplasm of gastrointestinal tract   . Hyperlipidemia   . Hypertension   . Personal history of colonic polyps 11/12/2006  . Sleep apnea     Patient Active Problem List   Diagnosis Date Noted  . Neck pain 06/12/2020  . Peyronie disease 06/12/2020  . Elevated bilirubin 05/26/2019  . Lower urinary tract symptoms (LUTS) 05/24/2019  . Osteoporosis 01/27/2017  . Erectile dysfunction 01/29/2016  . Syncope 01/01/2016  . Lumbar vertebral fracture (HCC) 07/22/2015  . Hyperlipidemia 07/22/2015  . Myocardiopathy (HCC) 07/11/2013  . Essential hypertension 05/23/2012  . Sleep apnea: unable to tolerate CPAP 03/04/2011  . Atrial fibrillation (HCC) 01/06/2007    Past Surgical History:  Procedure Laterality Date  . 2-D echocardiogram  07/22/2010   Ejection fraction greater than 55%. Left atrium moderately dilated. Right atrium moderately dilated. 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 Mild to Moderate MR. Mild to moderate TR.  . ATRIAL ABLATION SURGERY  2004  . EP IMPLANTABLE DEVICE N/A 02/11/2016   Procedure: Loop Recorder Insertion;  Surgeon: Thurmon Fair, MD;   [facility];  Service: Cardiovascular;  Laterality: N/A;  . Exercise Myoview stress test  07/08/2000   Nonischemic low-risk.  Marland Kitchen HIP FRACTURE SURGERY  1970s  . IR GENERIC HISTORICAL  01/21/2017   IR RADIOLOGIST EVAL & MGMT 01/21/2017 [facility]       Home Medications    Prior to Admission medications   Medication Sig Start Date End Date Taking? Authorizing Provider  alendronate (FOSAMAX) 70 MG tablet Take 70 mg by mouth once a week. 12/20/20  Yes [provider]  amLODipine (NORVASC) 2.5 MG tablet Take 2.5 mg by mouth daily.  08/23/19  Yes [provider]  aspirin 81 MG tablet Take 81 mg by mouth daily.   Yes [provider]  atorvastatin (LIPITOR) 40 MG tablet Take 1 tablet (40 mg total) by mouth daily. 10/07/20  Yes Carney Living, MD  ibuprofen (ADVIL,MOTRIN) 800 MG tablet Take 800 mg by mouth every 8 (eight) hours as needed for mild pain or moderate pain.   Yes [provider]  losartan (COZAAR) 50 MG tablet Take 2 tablets (100 mg total) by mouth daily. 12/24/20  Yes Chambliss, Estill Batten, MD  Magnesium 250 MG TABS Take 1 tablet by mouth daily.   Yes [provider]  tamsulosin (FLOMAX) 0.4 MG CAPS capsule Take 1 capsule (0.4 mg total) by mouth daily. Please come in for a physical 12/18/20  Yes Carney Living, MD  calcium carbonate (OSCAL) 1500 (600 Ca) MG TABS tablet Take by mouth 2 (two) times daily with a meal.    [provider]  MULTIPLE VITAMIN PO Take 1 tablet by mouth daily.    [provider]  Probiotic Product (PROBIOTIC DAILY PO) Take 46 mg by mouth. [employer] probiotic 10 Patient not taking: Reported on 05/24/2020    [provider]  sildenafil (VIAGRA) 50 MG tablet TAKE 1 TABLET BY MOUTH DAILY AS NEEDED FOR ERECTILE DYSFUNCTION. 05/23/20   Croitoru, Rachelle Hora, MD    Family History Family History  Problem Relation Age of Onset  . Colon cancer Other        Great grandmother  .  Heart disease Father   . Colon cancer Maternal Uncle   . Stomach cancer Other        niece in her 15's    Social History Social History   Tobacco Use  . Smoking status: Never Smoker  . Smokeless tobacco: Never Used  Substance Use Topics  . Alcohol use: No  . Drug use: No     Allergies   Patient has no known allergies.   Review of Systems Review of Systems  All other systems reviewed and are negative.    Physical Exam Triage Vital Signs ED Triage Vitals  Enc Vitals Group     BP 12/27/20 0823 (!) 183/102     Pulse Rate 12/27/20 0823 70     Resp 12/27/20 0823 18     Temp 12/27/20 0823 97.9 F (36.6 C)     Temp Source 12/27/20 0823 Oral     SpO2 12/27/20 0823 98 %     Weight --      Height --      Head Circumference --      Peak Flow --      Pain Score 12/27/20 0819 5     Pain Loc --      Pain Edu? --      Excl. in GC? --    No data found.  Updated Vital Signs BP (!) 183/102 (BP Location: Right Arm)   Pulse 70   Temp 97.9 F (36.6 C) (Oral)   Resp 18   SpO2 98%   Visual Acuity Right Eye Distance:   Left Eye Distance:   Bilateral Distance:    Right Eye Near:   Left Eye Near:    Bilateral Near:     Physical Exam Vitals and nursing note reviewed.  Constitutional:      Appearance: He is well-developed and well-nourished.  HENT:     Head: Normocephalic.  Eyes:     Extraocular Movements: EOM normal.  Cardiovascular:     Rate and Rhythm: Normal rate.     Pulses: Normal pulses.  Pulmonary:     Effort: Pulmonary effort is normal.  Abdominal:     General: Abdomen is flat. There is no distension.  Musculoskeletal:        General: Normal range of motion.     Cervical back: Normal range of motion.  Neurological:     General: No focal deficit present.     Mental Status: He is alert and oriented to person, place, and time.  Psychiatric:        Mood and Affect: Mood and affect and mood normal.      UC Treatments / Results  Labs (all labs  ordered are listed, but only abnormal results are displayed) Labs Reviewed -  No data to display  EKG   Radiology DG Knee Complete 4 Views Left  Result Date: 12/27/2020 CLINICAL DATA:  Knee pain EXAM: LEFT KNEE - COMPLETE 4+ VIEW COMPARISON:  None. FINDINGS: No acute fracture or dislocation. Joint spaces and alignment are maintained. No area of erosion or osseous destruction. No unexpected radiopaque foreign body. Soft tissues are unremarkable. IMPRESSION: No acute fracture or dislocation. Electronically Signed   By: Meda Klinefelter MD   On: 12/27/2020 08:42    Procedures Procedures (including critical care time)  Medications Ordered in UC Medications - No data to display  Initial Impression / Assessment and Plan / UC Course  I have reviewed the triage vital signs and the nursing notes.  Pertinent labs & imaging results that were available during my care of the patient were reviewed by me and considered in my medical decision making (see chart for details).     MDM:  Xray no acute.  Pt placed in a knee sleeve.  Pt advised to follow up with orthopaedist  Final Clinical Impressions(s) / UC Diagnoses   Final diagnoses:  Acute pain of left knee     Discharge Instructions     See Dr. Farris Has for recheck.  Wear knee sleeve for supports. Ibuprofen for soreness    ED Prescriptions    None     PDMP not reviewed this encounter.  An After Visit Summary was printed and given to the patient.    Elson Areas, [place] 12/27/20 1036

## 2020-12-27 NOTE — ED Notes (Signed)
Applied knee sleeve to left knee

## 2020-12-27 NOTE — Discharge Instructions (Addendum)
See Dr. Farris Has for recheck.  Wear knee sleeve for supports. Ibuprofen for soreness

## 2020-12-30 ENCOUNTER — Other Ambulatory Visit: Payer: Self-pay | Admitting: *Deleted

## 2020-12-30 MED ORDER — ALENDRONATE SODIUM 70 MG PO TABS: 70.0000 mg | ORAL_TABLET | ORAL | 0 refills | Status: DC

## 2021-01-07 DIAGNOSIS — S92355D Nondisplaced fracture of fifth metatarsal bone, left foot, subsequent encounter for fracture with routine healing: Secondary | ICD-10-CM | POA: Diagnosis not present

## 2021-02-08 ENCOUNTER — Other Ambulatory Visit (HOSPITAL_COMMUNITY): Payer: Self-pay

## 2021-02-21 ENCOUNTER — Telehealth: Payer: Self-pay | Admitting: Genetic Counselor

## 2021-02-21 NOTE — Telephone Encounter (Signed)
Returned Mr. Meininger phone call to make a genetic counseling appointment. Scheduled for Tuesday, 03/04/21, at 1pm.

## 2021-03-04 ENCOUNTER — Encounter: Payer: Self-pay | Admitting: Genetic Counselor

## 2021-03-04 ENCOUNTER — Other Ambulatory Visit: Payer: Self-pay

## 2021-03-04 ENCOUNTER — Inpatient Hospital Stay: Payer: Medicare PPO | Attending: Radiation Oncology | Admitting: Genetic Counselor

## 2021-03-04 ENCOUNTER — Inpatient Hospital Stay: Payer: Medicare PPO

## 2021-03-04 DIAGNOSIS — Z803 Family history of malignant neoplasm of breast: Secondary | ICD-10-CM | POA: Insufficient documentation

## 2021-03-04 DIAGNOSIS — Z8481 Family history of carrier of genetic disease: Secondary | ICD-10-CM

## 2021-03-04 DIAGNOSIS — Z8049 Family history of malignant neoplasm of other genital organs: Secondary | ICD-10-CM | POA: Diagnosis not present

## 2021-03-04 DIAGNOSIS — Z800 Family history of malignant neoplasm of digestive organs: Secondary | ICD-10-CM | POA: Diagnosis not present

## 2021-03-06 ENCOUNTER — Encounter: Payer: Self-pay | Admitting: Genetic Counselor

## 2021-03-06 NOTE — Progress Notes (Signed)
REFERRING PROVIDER: Lind Covert, MD [address] [place],   [address]  PRIMARY PROVIDER:  Lind Covert, MD  PRIMARY REASON FOR VISIT:  1. Family history of gene mutation   2. Family history of breast cancer   3. Family history of malignant neoplasm of gastrointestinal tract   4. Family history of cancer of male genital organ       HISTORY OF PRESENT ILLNESS:   Wayne Molina, a 65 y.o. male, was seen for a [facility] cancer genetics consultation due to a family history of a known BRIP1 gene mutation and cancer.  Wayne Molina presents to clinic today to discuss the possibility of a hereditary predisposition to cancer, genetic testing, and to further clarify his future cancer risks, as well as potential cancer risks for family members.   Wayne Molina does not have a personal history of cancer. He has had two colonoscopies (2008 and 2016) and had one polyp removed in 2008.   Past Medical History:  Diagnosis Date  . Anxiety   . Arthritis    fingers  . Chronic atrial fibrillation ([place])   . Depression   . Family history of breast cancer   . Family history of cancer of male genital organ   . Family history of gene mutation    BRIP1  . Family history of malignant neoplasm of gastrointestinal tract   . Hyperlipidemia   . Hypertension   . Personal history of colonic polyps 11/12/2006  . Sleep apnea     Past Surgical History:  Procedure Laterality Date  . 2-D echocardiogram  07/22/2010   Ejection fraction greater than 55%. Left atrium moderately dilated. Right atrium moderately dilated. 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 Mild to Moderate MR. Mild to moderate TR.  . ATRIAL ABLATION SURGERY  2004  . EP IMPLANTABLE DEVICE N/A 02/11/2016   Procedure: Loop Recorder Insertion;  Surgeon: Sanda Klein, MD;  Location: [facility];  Service: Cardiovascular;  Laterality: N/A;  . Exercise Myoview stress test  07/08/2000   Nonischemic low-risk.  Marland Kitchen HIP FRACTURE  SURGERY  1970s  . IR GENERIC HISTORICAL  01/21/2017   IR RADIOLOGIST EVAL & MGMT 01/21/2017 [facility]    Social History   Socioeconomic History  . Marital status: Divorced    Spouse name: Not on file  . Number of children: 3  . Years of education: 7  . Highest education level: Not on file  Occupational History  . Not on file  Tobacco Use  . Smoking status: Never Smoker  . Smokeless tobacco: Never Used  Substance and Sexual Activity  . Alcohol use: No  . Drug use: No  . Sexual activity: Yes  Other Topics Concern  . Not on file  Social History Narrative   Patient lives alone in [place], his daughter recently moved out.    Patient enjoys going to the gym, watching TV, and going to antique shops.    Patient is divorced with 3 children, does have a girlfriend.    Social Determinants of Health   Financial Resource Strain: Low Risk   . Difficulty of Paying Living Expenses: Not hard at all  Food Insecurity: No Food Insecurity  . Worried About Charity fundraiser in the Last Year: Never true  . Ran Out of Food in the Last Year: Never true  Transportation Needs: No Transportation Needs  . Lack of Transportation (Medical): No  . Lack of Transportation (Non-Medical): No  Physical Activity: Sufficiently Active  .  Days of Exercise per Week: 4 days  . Minutes of Exercise per Session: 120 min  Stress: No Stress Concern Present  . Feeling of Stress : Only a little  Social Connections: Socially Isolated  . Frequency of Communication with Friends and Family: More than three times a week  . Frequency of Social Gatherings with Friends and Family: More than three times a week  . Attends Religious Services: Never  . Active Member of Clubs or Organizations: No  . Attends Archivist Meetings: Never  . Marital Status: Divorced     FAMILY HISTORY:  We obtained a detailed, 4-generation family history.  Significant diagnoses are listed below: Family History  Problem  Relation Age of Onset  . Cancer Paternal Great-grandmother        abdominal cancer (PGF's mother) great grandmother  . Heart disease Father   . Diabetes Father   . Colon cancer Maternal Uncle   . Cancer Niece 10       gynecologic; niece in her 67's  . Dementia Mother   . Other Sister        BRIP1 gene mutation  . Kidney disease Brother   . Lung cancer Paternal Aunt        d. >50  . Cancer Paternal Aunt        unknown type, d. >50  . Cancer Cousin 17       gynecologic (paternal first cousin)  . Breast cancer Niece 5  . Other Niece        BRIP1 gene mutation  . Colon cancer Nephew 27       arising in colon polyp   Wayne Molina has one daughter (age 10) and had two sons, both now deceased. He had two brothers and two sister. None of these relatives have had cancer. One niece had breast cancer and vulvar cancer diagnosed at age 4 and had genetic testing that revealed a mutation in the BRIP1 gene called [id number]. One nephew had colon cancer arising in a polyp at age 74. Another niece died at age 83 from a gynecologic cancer. One sister also tested positive for the BRIP1 gene mutation.  Wayne Molina mother is alive at age 2 without cancer. There was one maternal aunt and there were two maternal uncles. There is no known cancer among maternal aunts/uncles or maternal cousins. Wayne Molina maternal grandmother died at age 64 without cancer. His maternal grandfather died in his late 75s without cancer.  Wayne Molina father died at age 82 without cancer. There were five paternal aunts and one paternal uncle. One aunt died from lung cancer older than 59. Another aunt died from an unknown cancer older than 23. One paternal cousin died at age 27 from a gynecologic cancer. Wayne Molina paternal grandmother died at age 71 without cancer. His paternal grandfather died at age 79 without cancer. A paternal great-grandmother (PGF's mother) died from an unknown abdominal cancer, and this great-grandmother's  sister had breast cancer.  Wayne Molina is aware of previous family history of genetic testing for hereditary cancer risks. Patient's maternal ancestors are of [place] descent, and paternal ancestors are of [place] and [place] Native [place] descent. There is no reported Ashkenazi Jewish ancestry. There is no known consanguinity.  GENETIC COUNSELING ASSESSMENT: Wayne Molina is a 65 y.o. male with a family history of a known BRIP1 mutation, breast cancer, gynecologic cancer, and colon cancer. We, therefore, discussed and recommended the following at today's visit.   DISCUSSION:  We discussed  that Wayne Molina has a 50% (1 in 2) chance to also have the BRIP1 variant that was discovered in his sister and niece. We reviewed the cancer risks that are associated with BRIP1 mutations, including an increased risk of ovarian cancer and possibly breast cancer in women. Individuals with a BRIP1 mutation may opt for risk reduction strategies for the associated cancer risks per the NCCN guidelines.  Although men with a BRIP1 mutation are not known to have an increased risk for cancer, knowing whether Wayne Molina this gene mutation will provide important information for his daughter. We reviewed the characteristics, features and inheritance patterns of hereditary cancer syndromes. We also discussed genetic testing, including the appropriate family members to test, the process of testing, insurance coverage, genetic discrimination, and turn-around-time for results. We discussed the implications of a negative vs a positive result.  We recommended Wayne Molina pursue genetic testing for the known familial variant in the BRIP1 gene, called [id number], through [facility]. Because Wayne Molina sister had genetic testing that identified a pathogenic variant through [facility], testing in Wayne Molina will be free of charge if carried out through the same genetic testing laboratory if completed within 150 days of the original  report. Since we are within the 150 day time period, the test will be free of charge.  PLAN: After considering the risks, benefits, and limitations, Wayne Molina provided informed consent to pursue genetic testing and the blood sample was sent to [facility] for analysis of the BRIP1 gene. Results should be available within approximately two-three weeks' time, at which point they will be disclosed by telephone to Mr. Henrichs, as will any additional recommendations warranted by these results. Mr. Yeakle will receive a summary of his genetic counseling visit and a copy of his results once available. This information will also be available in Epic.   Mr. Kielty questions were answered to his satisfaction today. Our contact information was provided should additional questions or concerns arise. Thank you for the referral and allowing [place] to share in the care of your patient.   Clint Guy, [place], [facility] Licensed, Certified Dispensing [email or web address] Phone: [phone]  The patient was seen for a total of 30 minutes in face-to-face genetic counseling.  This patient was discussed with Drs. Magrinat, Lindi Adie and/or Burr Medico who agrees with the above.    _______________________________________________________________________ For Office Staff:  Number of people involved in session: 1 Was an Intern/ student involved with case: no

## 2021-03-19 DIAGNOSIS — Z1379 Encounter for other screening for genetic and chromosomal anomalies: Secondary | ICD-10-CM | POA: Insufficient documentation

## 2021-03-20 ENCOUNTER — Ambulatory Visit: Payer: Self-pay | Admitting: Genetic Counselor

## 2021-03-20 ENCOUNTER — Encounter: Payer: Self-pay | Admitting: Genetic Counselor

## 2021-03-20 ENCOUNTER — Telehealth: Payer: Self-pay | Admitting: Genetic Counselor

## 2021-03-20 DIAGNOSIS — Z1379 Encounter for other screening for genetic and chromosomal anomalies: Secondary | ICD-10-CM

## 2021-03-20 NOTE — Telephone Encounter (Signed)
Revealed negative genetic testing. Wayne Molina did not inherit the BRIP1 mutation that was identified in his sister. Discussed that his daughter will not need genetic testing based on this result. It will still be important for everyone to let their doctors know about the family history of cancer.

## 2021-03-20 NOTE — Progress Notes (Signed)
HPI:  Mr. Kluth was previously seen in the [place] clinic due to a family history of a known BRIP1 mutation and concerns regarding a hereditary predisposition to cancer. Please refer to our prior cancer genetics clinic note for more information regarding our discussion, assessment and recommendations, at the time. Mr. Mccaughey recent genetic test results were disclosed to him, as were recommendations warranted by these results. These results and recommendations are discussed in more detail below.  FAMILY HISTORY:  We obtained a detailed, 4-generation family history.  Significant diagnoses are listed below: Family History  Problem Relation Age of Onset  . Cancer Paternal Great-grandmother        abdominal cancer (PGF's mother) great grandmother  . Heart disease Father   . Diabetes Father   . Colon cancer Maternal Uncle   . Cancer Niece 67       gynecologic; niece in her 25's  . Dementia Mother   . Other Sister        BRIP1 gene mutation  . Kidney disease Brother   . Lung cancer Paternal Aunt        d. >50  . Cancer Paternal Aunt        unknown type, d. >50  . Cancer Cousin 17       gynecologic (paternal first cousin)  . Breast cancer Niece 38  . Other Niece        BRIP1 gene mutation  . Colon cancer Nephew 27       arising in colon polyp    Mr. Kerschner has one daughter (age 104) and had two sons, both now deceased. He had two brothers and two sister. None of these relatives have had cancer. One niece had breast cancer and vulvar cancer diagnosed at age 21 and had genetic testing that revealed a mutation in the BRIP1 gene called [id number]. One nephew had colon cancer arising in a polyp at age 27. Another niece died at age 64 from a gynecologic cancer. One sister also tested positive for the BRIP1 gene mutation.  Mr. Edman mother is alive at age 40 without cancer. There was one maternal aunt and there were two maternal uncles. There is no known cancer among maternal  aunts/uncles or maternal cousins. Mr. Bagheri maternal grandmother died at age 56 without cancer. His maternal grandfather died in his late 83s without cancer.  Mr. Catalfamo father died at age 49 without cancer. There were five paternal aunts and one paternal uncle. One aunt died from lung cancer older than 26. Another aunt died from an unknown cancer older than 31. One paternal cousin died at age 62 from a gynecologic cancer. Mr. Roesler paternal grandmother died at age 37 without cancer. His paternal grandfather died at age 75 without cancer. A paternal great-grandmother (PGF's mother) died from an unknown abdominal cancer, and this great-grandmother's sister had breast cancer.  Mr. Weckwerth is aware of previous family history of genetic testing for hereditary cancer risks. Patient's maternal ancestors are of [place] descent, and paternal ancestors are of [place] and [place] descent. There is no reported Ashkenazi Jewish ancestry. There is no known consanguinity.  GENETIC TEST RESULTS: Genetic testing of the BRIP1 gene was performed by [facility] and reported out on 03/19/2021. No pathogenic variants were detected.  Testing included sequence analysis and deletion/duplication testing of the BRIP1 gene. The test report will be scanned into EPIC and located under the Molecular Pathology section of the Results Review tab.  A portion of  the result report is included below for reference.     We recommended Mr. Musa pursue testing for the familial BRIP1 gene mutation called c.2010dup. Mr. Binette test was normal and did not reveal the familial mutation. We call this result a true negative result because the cancer-causing mutation was identified in Mr. Maestas family, and he did not inherit it. Given this negative result, Mr. Laday chances of developing BRIP1-related cancers are the same as they are in the general population. Additionally, his daughter does not need to have  genetic testing for this familial mutation.  CANCER SCREENING RECOMMENDATIONS: Mr. Dunnavant test result is considered negative (normal). While reassuring, this does not definitively rule out a hereditary predisposition to cancer. It is still possible that there could be genetic mutations that are undetectable by current technology. There could be genetic mutations in genes that have not been tested or identified to increase cancer risk. Therefore, it is recommended he continue to follow the cancer management and screening guidelines provided by his primary healthcare provider.   An individual's cancer risk and medical management are not determined by genetic test results alone. Overall cancer risk assessment incorporates additional factors, including personal medical history, family history, and any available genetic information that may result in a personalized plan for cancer prevention and surveillance.  RECOMMENDATIONS FOR FAMILY MEMBERS:  Individuals in this family might be at some increased risk of developing cancer, over the general population risk, simply due to the family history of cancer.  We recommended women in this family have a yearly mammogram beginning at age 31, or 106 years younger than the earliest onset of cancer, an annual clinical breast exam, and perform monthly breast self-exams. Women in this family should also have a gynecological exam as recommended by their primary provider. All family members should be referred for colonoscopy starting at age 48.  FOLLOW-UP: Lastly, we discussed with Mr. Demarais that cancer genetics is a rapidly advancing field and it is possible that new genetic tests will be appropriate for him and/or his family members in the future. We encouraged him to remain in contact with cancer genetics on an annual basis so we can update his personal and family histories and let him know of advances in cancer genetics that may benefit this family.   Our contact number  was provided. Mr. Slaymaker questions were answered to his satisfaction, and he knows he is welcome to call us at anytime with additional questions or concerns.   Clint Guy, MS, [facility] Genetic Counselor [email or web address] Phone: [phone]

## 2021-05-26 ENCOUNTER — Other Ambulatory Visit: Payer: Self-pay | Admitting: Family Medicine

## 2021-06-14 ENCOUNTER — Other Ambulatory Visit: Payer: Self-pay | Admitting: Family Medicine

## 2021-06-18 ENCOUNTER — Encounter: Payer: Self-pay | Admitting: Pharmacist

## 2021-06-18 ENCOUNTER — Ambulatory Visit (INDEPENDENT_AMBULATORY_CARE_PROVIDER_SITE_OTHER): Payer: Medicare PPO | Admitting: Pharmacist

## 2021-06-18 VITALS — BP 117/68 | Wt 165.0 lb

## 2021-06-18 DIAGNOSIS — Z0000 Encounter for general adult medical examination without abnormal findings: Secondary | ICD-10-CM

## 2021-06-18 NOTE — Progress Notes (Signed)
Subjective:   Wayne Molina is a 65 y.o. male who presents for Medicare Annual/Subsequent preventive examination.  The patient consented to a virtual visit. Patient consented to have virtual visit and was identified by name and date of birth. Method of visit: Telephone Encounter participants: Patient: Wayne Molina - located at home Nurse/Provider: Hughes Better - located at [facility]  Others (if applicable): N/A   Review of Systems:  Cardiac Risk Factors include: hypertension;advanced age (>64mn, >>66women);male gender     Objective:    Vitals: BP 117/68   Wt 165 lb (74.8 kg)   BMI 24.37 kg/m   Body mass index is 24.37 kg/m.  Advanced Directives 06/18/2021 05/24/2020 07/10/2019 05/24/2019 12/18/2016 02/11/2016 01/01/2016  Does Patient Have a Medical Advance Directive? _0  No No  Would patient like information on creating a medical advance directive? Yes (MAU/Ambulatory/Procedural Areas - Information given) Yes (MAU/Ambulatory/Procedural Areas - Information given) No - Patient declined No - Patient declined No - Patient declined No - patient declined information No - patient declined information    Tobacco Social History   Tobacco Use  Smoking Status Never  Smokeless Tobacco Never     Counseling given: Not Answered   Clinical Intake:  Pre-visit preparation completed: Yes  Pain : No/denies pain     Nutritional Status: BMI 25 -29 Overweight Diabetes: No  How often do you need to have someone help you when you read instructions, pamphlets, or other written materials from your doctor or pharmacy?: 1 - Never What is the last grade level you completed in school?: GED  Interpreter Needed?: No  Information entered by :: RHughes Better PharmD, CPP  Past Medical History:  Diagnosis Date   Anxiety    Arthritis    fingers   [place]    Depression    Family history of breast cancer    Family history of cancer of  male genital organ    Family history of gene mutation    BRIP1   Family history of malignant neoplasm of gastrointestinal tract    Hyperlipidemia    Hypertension    Personal history of colonic polyps 11/12/2006   Sleep apnea    Past Surgical History:  Procedure Laterality Date   2-D echocardiogram  07/22/2010   Ejection fraction greater than 55%. Left atrium moderately dilated. Right atrium moderately dilated. 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 Mild to Moderate MR. Mild to moderate TR.   ATRIAL ABLATION SURGERY  2004   EP IMPLANTABLE DEVICE N/A 02/11/2016   Procedure: Loop Recorder Insertion;  Surgeon: MSanda Klein MD;  [facility];  Service: Cardiovascular;  Laterality: N/A;   Exercise Myoview stress test  07/08/2000   Nonischemic low-risk.   HIP FRACTURE SURGERY  1970s   IR GENERIC HISTORICAL  01/21/2017   IR RADIOLOGIST EVAL & MGMT 01/21/2017 [facility]   Family History  Problem Relation Age of Onset   Dementia Mother    Heart disease Father    Diabetes Father    Other Sister        BRIP1 gene mutation   Kidney disease Brother    Colon cancer Maternal Uncle    Lung cancer Paternal Aunt        d. >50   Cancer Paternal Aunt        unknown type, d. >50   Cancer Cousin 17       gynecologic (paternal first cousin)  Colon cancer Nephew 17       arising in colon polyp   Cancer Niece 58       gynecologic; niece in her 59's   Breast cancer Niece 65   Other Niece        BRIP1 gene mutation   Cancer Paternal Great-grandmother        abdominal cancer (PGF's mother) great grandmother   Social History   Socioeconomic History   Marital status: Divorced    Spouse name: Not on file   Number of children: 3   Years of education: 12   Highest education level: GED or equivalent  Occupational History   Not on file  Tobacco Use   Smoking status: Never   Smokeless tobacco: Never  Substance and Sexual Activity   Alcohol use: No   Drug use: No   Sexual activity:  Yes  Other Topics Concern   Not on file  Social History Narrative   Patient lives alone in [place] in a one story home. There are no steps.    Patient enjoys going to the gym, watching TV, and going to antique shops.    Patient is divorced with 3 children, two of which have passed; does have a girlfriend.    Patient has his own car and is able to drive.    Social Determinants of Health   Financial Resource Strain: Not on file  Food Insecurity: Not on file  Transportation Needs: Not on file  Physical Activity: Not on file  Stress: Not on file  Social Connections: Not on file    Outpatient Encounter Medications as of 06/18/2021  Medication Sig   alendronate (FOSAMAX) 70 MG tablet TAKE 1 TABLET (70 MG TOTAL) BY MOUTH ONCE A WEEK. TAKE WITH A FULL GLASS OF WATER ON AN EMPTY STOMACH.   amLODipine (NORVASC) 2.5 MG tablet Take 2.5 mg by mouth daily.    aspirin 81 MG tablet Take 81 mg by mouth daily.   atorvastatin (LIPITOR) 40 MG tablet TAKE 1 TABLET (40 MG TOTAL) BY MOUTH DAILY.   calcium carbonate (OSCAL) 1500 (600 Ca) MG TABS tablet Take by mouth 2 (two) times daily with a meal.   ibuprofen (ADVIL,MOTRIN) 800 MG tablet Take 800 mg by mouth every 8 (eight) hours as needed for mild pain or moderate pain.   losartan (COZAAR) 50 MG tablet Take 2 tablets (100 mg total) by mouth daily.   Magnesium 250 MG TABS Take 1 tablet by mouth daily.   MULTIPLE VITAMIN PO Take 1 tablet by mouth daily.   tamsulosin (FLOMAX) 0.4 MG CAPS capsule Take 1 capsule (0.4 mg total) by mouth daily. Please come in for a physical   Probiotic Product (PROBIOTIC DAILY PO) Take 46 mg by mouth. [employer] probiotic 10 (Patient not taking: Reported on 05/24/2020)   sildenafil (VIAGRA) 50 MG tablet TAKE 1 TABLET BY MOUTH DAILY AS NEEDED FOR ERECTILE DYSFUNCTION. (Patient not taking: Reported on 06/18/2021)   [DISCONTINUED] alendronate (FOSAMAX) 70 MG tablet Take 1 tablet (70 mg total) by mouth once a week. PLEASE MAKE AN  APPOINTMENT FOR A PHYSICAL   [DISCONTINUED] alendronate (FOSAMAX) 70 MG tablet TAKE 1 TABLET ONCE A WEEK AS DIRECTED. PLEASE MAKE AN APPOINTMENT FOR A PHYSICAL   No facility-administered encounter medications on file as of 06/18/2021.    Activities of Daily Living In your present state of health, do you have any difficulty performing the following activities: 06/18/2021  Hearing? N  Vision? N  Difficulty concentrating or  making decisions? N  Walking or climbing stairs? N  Dressing or bathing? N  Doing errands, shopping? N  Preparing Food and eating ? N  Using the Toilet? N  In the past six months, have you accidently leaked urine? Y  Do you have problems with loss of bowel control? N  Managing your Medications? N  Managing your Finances? N  Housekeeping or managing your Housekeeping? N  Some recent data might be hidden    Patient Care Team: Lind Covert, MD as PCP - General Croitoru, Dani Gobble, MD as Consulting Physician (Cardiology)   Assessment:   This is a routine wellness examination for [place].  Exercise Activities and Dietary recommendations Current Exercise Habits: Structured exercise class, Type of exercise: strength training/weights;treadmill, Time (Minutes): 45, Frequency (Times/Week): 5, Weekly Exercise (Minutes/Week): 225, Exercise limited by: None identified   Goals      Patient Stated     Maintain my level of activity.         Fall Risk Fall Risk  05/24/2020 07/10/2019 05/24/2019 12/16/2016 01/01/2016  Falls in the past year? 1 0 0 No Yes  Number falls in past yr: 0 0 0 - -  Injury with Fall? 1 - 0 - No  Risk for fall due to : Impaired balance/gait - - - -  Follow up Falls prevention discussed - - - -   Is the patient's home free of loose throw rugs in walkways, pet beds, electrical cords, etc?   yes      Grab bars in the bathroom? no      Handrails on the stairs?    N/a      Adequate lighting?   yes  Timed Get Up and Go Performed: N/a Patient rating  of health (0-10): 8   Depression Screen PHQ 2/9 Scores 06/18/2021 06/12/2020 05/24/2020 07/10/2019  PHQ - 2 Score 2 0 0 0  PHQ- 9 Score 4 1 - -    Cognitive Function     6CIT Screen 06/18/2021 05/24/2020  What Year? 0 points 0 points  What month? 0 points 0 points  What time? 0 points 0 points  Count back from 20 0 points 0 points  Months in reverse 0 points 0 points  Repeat phrase 0 points 0 points  Total Score 0 0    Immunization History  Administered Date(s) Administered   Influenza,inj,Quad PF,6+ Mos 07/10/2019   Influenza-Unspecified 08/09/2014, 08/13/2016   PFIZER(Purple Top)SARS-COV-2 Vaccination 11/20/2019, 12/08/2019   Pneumococcal Polysaccharide-23 05/24/2019   Td 05/09/2006   Tdap 01/26/2018   Zoster Recombinat (Shingrix) 07/18/2015   Zoster, Live 07/18/2015    Screening Tests Health Maintenance  Topic Date Due   DTAP VACCINES (1) 07/18/1956   Zoster Vaccines- Shingrix (2 of 2) 09/12/2015   COVID-19 Vaccine (3 - Booster for Pfizer series) 05/07/2020   PNA vac Low Risk Adult (2 of 2 - PCV13) 05/23/2020   INFLUENZA VACCINE  06/09/2021   COLONOSCOPY (Pts 45-1yr Insurance coverage will need to be confirmed)  06/04/2025   DTaP/Tdap/Td (3 - Td or Tdap) 01/27/2028   TETANUS/TDAP  01/27/2028   Hepatitis C Screening  Completed   HPV VACCINES  Aged Out   Cancer Screenings: Lung: Low Dose CT Chest recommended if Age 67-80years, 30 pack-year currently smoking OR have quit w/in 15years. Patient does not qualify. Colorectal: Due in 2026  Additional Screenings:  none       Plan:   Recommended patient receive 2nd dose of Shingrix vaccine, COVID-19 booster,  and Tetanus booster. Patient believes he may have had the tetanus booster so he will follow-up to confirm.  I have personally reviewed and noted the following in the patient's chart:   Medical and social history Use of alcohol, tobacco or illicit drugs  Current medications and supplements Functional ability and  status Nutritional status Physical activity Advanced directives List of other physicians Hospitalizations, surgeries, and ER visits in previous 12 months Vitals Screenings to include cognitive, depression, and falls Referrals and appointments  In addition, I have reviewed and discussed with patient certain preventive protocols, quality metrics, and best practice recommendations. A written personalized care plan for preventive services as well as general preventive health recommendations were provided to patient.   This visit was conducted virtually in the setting of the [place] pandemic.    Hughes Better, RPH-CPP  06/18/2021

## 2021-06-19 NOTE — Patient Instructions (Addendum)
You spoke to Cheral Almas, RPH-CPP over the phone for your annual wellness visit.  We discussed goals:   Goals     . Patient Stated     Maintain my level of activity.         We also discussed recommended health maintenance. Please call our office and schedule a visit. As discussed, you are due for: Health Maintenance  Topic Date Due  . DTAP VACCINES (1) 07/18/1956  . Zoster Vaccines- Shingrix (2 of 2) 09/12/2015  . COVID-19 Vaccine (3 - Booster for Pfizer series) 05/07/2020  . PNA vac Low Risk Adult (2 of 2 - PCV13) 05/23/2020  . INFLUENZA VACCINE  06/09/2021  . COLONOSCOPY (Pts 45-85yrs Insurance coverage will need to be confirmed)  06/04/2025  . DTaP/Tdap/Td (3 - Td or Tdap) 01/27/2028  . TETANUS/TDAP  01/27/2028  . Hepatitis C Screening  Completed  . HPV VACCINES  Aged Out    We also discussed obtaining your second dose of Shingrix and your Covid-19 booster. Please confirm if you received your tetanus booster in the last 10 years.    Our clinic's number is [phone]. Please call with questions or concerns about what we discussed today.  Fall Prevention in the Home, Adult Falls can cause injuries and can happen to people of all ages. There are many things you can do to make your home safe and to help prevent falls. Ask forhelp when making these changes. What actions can I take to prevent falls? General Instructions Use good lighting in all rooms. Replace any light bulbs that burn out. Turn on the lights in dark areas. Use night-lights. Keep items that you use often in easy-to-reach places. Lower the shelves around your home if needed. Set up your furniture so you have a clear path. Avoid moving your furniture around. Do not have throw rugs or other things on the floor that can make you trip. Avoid walking on wet floors. If any of your floors are uneven, fix them. Add color or contrast paint or tape to clearly mark and help you see: Grab bars or handrails. First  and last steps of staircases. Where the edge of each step is. If you use a stepladder: Make sure that it is fully opened. Do not climb a closed stepladder. Make sure the sides of the stepladder are locked in place. Ask someone to hold the stepladder while you use it. Know where your pets are when moving through your home. What can I do in the bathroom?     Keep the floor dry. Clean up any water on the floor right away. Remove soap buildup in the tub or shower. Use nonskid mats or decals on the floor of the tub or shower. Attach bath mats securely with double-sided, nonslip rug tape. If you need to sit down in the shower, use a plastic, nonslip stool. Install grab bars by the toilet and in the tub and shower. Do not use towel bars as grab bars. What can I do in the bedroom? Make sure that you have a light by your bed that is easy to reach. Do not use any sheets or blankets for your bed that hang to the floor. Have a firm chair with side arms that you can use for support when you get dressed. What can I do in the kitchen? Clean up any spills right away. If you need to reach something above you, use a step stool with a grab bar. Keep electrical cords out of  the way. Do not use floor polish or wax that makes floors slippery. What can I do with my stairs? Do not leave any items on the stairs. Make sure that you have a light switch at the top and the bottom of the stairs. Make sure that there are handrails on both sides of the stairs. Fix handrails that are broken or loose. Install nonslip stair treads on all your stairs. Avoid having throw rugs at the top or bottom of the stairs. Choose a carpet that does not hide the edge of the steps on the stairs. Check carpeting to make sure that it is firmly attached to the stairs. Fix carpet that is loose or worn. What can I do on the outside of my home? Use bright outdoor lighting. Fix the edges of walkways and driveways and fix any  cracks. Remove anything that might make you trip as you walk through a door, such as a raised step or threshold. Trim any bushes or trees on paths to your home. Check to see if handrails are loose or broken and that both sides of all steps have handrails. Install guardrails along the edges of any raised decks and porches. Clear paths of anything that can make you trip, such as tools or rocks. Have leaves, snow, or ice cleared regularly. Use sand or salt on paths during winter. Clean up any spills in your garage right away. This includes grease or oil spills. What other actions can I take? Wear shoes that: Have a low heel. Do not wear high heels. Have rubber bottoms. Feel good on your feet and fit well. Are closed at the toe. Do not wear open-toe sandals. Use tools that help you move around if needed. These include: Canes. Walkers. Scooters. Crutches. Review your medicines with your doctor. Some medicines can make you feel dizzy. This can increase your chance of falling. Ask your doctor what else you can do to help prevent falls. Where to find more information Centers for Disease Control and Prevention, [employer]: FootballExhibition.com.br [employer] on Aging: https://walker.com/ Contact a doctor if: You are afraid of falling at home. You feel weak, drowsy, or dizzy at home. You fall at home. Summary There are many simple things that you can do to make your home safe and to help prevent falls. Ways to make your home safe include removing things that can make you trip and installing grab bars in the bathroom. Ask for help when making these changes in your home. This information is not intended to replace advice given to you by your health care provider. Make sure you discuss any questions you have with your healthcare provider. Document Revised: 05/29/2020 Document Reviewed: 05/29/2020 Elsevier Patient Education  2022 Elsevier Inc.  Health Maintenance, Male Adopting a healthy lifestyle and  getting preventive care are important in promoting health and wellness. Ask your health care provider about: The right schedule for you to have regular tests and exams. Things you can do on your own to prevent diseases and keep yourself healthy. What should I know about diet, weight, and exercise? Eat a healthy diet  Eat a diet that includes plenty of vegetables, fruits, low-fat dairy products, and lean protein. Do not eat a lot of foods that are high in solid fats, added sugars, or sodium.  Maintain a healthy weight Body mass index (BMI) is a measurement that can be used to identify possible weight problems. It estimates body fat based on height and weight. Your health care provider can help  determine your BMI and help you achieve or maintain ahealthy weight. Get regular exercise Get regular exercise. This is one of the most important things you can do for your health. Most adults should: Exercise for at least 150 minutes each week. The exercise should increase your heart rate and make you sweat (moderate-intensity exercise). Do strengthening exercises at least twice a week. This is in addition to the moderate-intensity exercise. Spend less time sitting. Even light physical activity can be beneficial. Watch cholesterol and blood lipids Have your blood tested for lipids and cholesterol at 67 years of age, then havethis test every 5 years. You may need to have your cholesterol levels checked more often if: Your lipid or cholesterol levels are high. You are older than 67 years of age. You are at high risk for heart disease. What should I know about cancer screening? Many types of cancers can be detected early and may often be prevented. Depending on your health history and family history, you may need to have cancer screening at various ages. This may include screening for: Colorectal cancer. Prostate cancer. Skin cancer. Lung cancer. What should I know about heart disease, diabetes, and  high blood pressure? Blood pressure and heart disease High blood pressure causes heart disease and increases the risk of stroke. This is more likely to develop in people who have high blood pressure readings, are of African descent, or are overweight. Talk with your health care provider about your target blood pressure readings. Have your blood pressure checked: Every 3-5 years if you are 33-38 years of age. Every year if you are 4 years old or older. If you are between the ages of 29 and 37 and are a current or former smoker, ask your health care provider if you should have a one-time screening for abdominal aortic aneurysm (AAA). Diabetes Have regular diabetes screenings. This checks your fasting blood sugar level. Have the screening done: Once every three years after age 61 if you are at a normal weight and have a low risk for diabetes. More often and at a younger age if you are overweight or have a high risk for diabetes. What should I know about preventing infection? Hepatitis B If you have a higher risk for hepatitis B, you should be screened for this virus. Talk with your health care provider to find out if you are at risk forhepatitis B infection. Hepatitis C Blood testing is recommended for: Everyone born from [birth year] through 1967. Anyone with known risk factors for hepatitis C. Sexually transmitted infections (STIs) You should be screened each year for STIs, including gonorrhea and chlamydia, if: You are sexually active and are younger than 67 years of age. You are older than 67 years of age and your health care provider tells you that you are at risk for this type of infection. Your sexual activity has changed since you were last screened, and you are at increased risk for chlamydia or gonorrhea. Ask your health care provider if you are at risk. Ask your health care provider about whether you are at high risk for HIV. Your health care provider may recommend a prescription medicine to  help prevent HIV infection. If you choose to take medicine to prevent HIV, you should first get tested for HIV. You should then be tested every 3 months for as long as you are taking the medicine. Follow these instructions at home: Lifestyle Do not use any products that contain nicotine or tobacco, such as cigarettes, e-cigarettes, and chewing  tobacco. If you need help quitting, ask your health care provider. Do not use street drugs. Do not share needles. Ask your health care provider for help if you need support or information about quitting drugs. Alcohol use Do not drink alcohol if your health care provider tells you not to drink. If you drink alcohol: Limit how much you have to 0-2 drinks a day. Be aware of how much alcohol is in your drink. In the [place] one drink equals one 12 oz bottle of beer (355 mL), one 5 oz glass of wine (148 mL), or one 1 oz glass of hard liquor (44 mL). General instructions Schedule regular health, dental, and eye exams. Stay current with your vaccines. Tell your health care provider if: You often feel depressed. You have ever been abused or do not feel safe at home. Summary Adopting a healthy lifestyle and getting preventive care are important in promoting health and wellness. Follow your health care provider's instructions about healthy diet, exercising, and getting tested or screened for diseases. Follow your health care provider's instructions on monitoring your cholesterol and blood pressure. This information is not intended to replace advice given to you by your health care provider. Make sure you discuss any questions you have with your healthcare provider. Document Revised: 10/19/2018 Document Reviewed: 10/19/2018 Elsevier Patient Education  2022 [employer].

## 2021-07-03 ENCOUNTER — Telehealth: Payer: Self-pay | Admitting: *Deleted
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
## 2021-07-04 ENCOUNTER — Encounter: Payer: Self-pay | Admitting: Family Medicine

## 2021-07-04 NOTE — Telephone Encounter (Signed)
Sent patient a My Chart message asking for more info

## 2021-07-15 IMAGING — DX DG KNEE COMPLETE 4+V*L*
4 series · 4 of 4 positions shown · non-contrast
Comparison: None.

CLINICAL DATA: Knee pain

EXAM:
LEFT KNEE - COMPLETE 4+ VIEW

[knee ap]
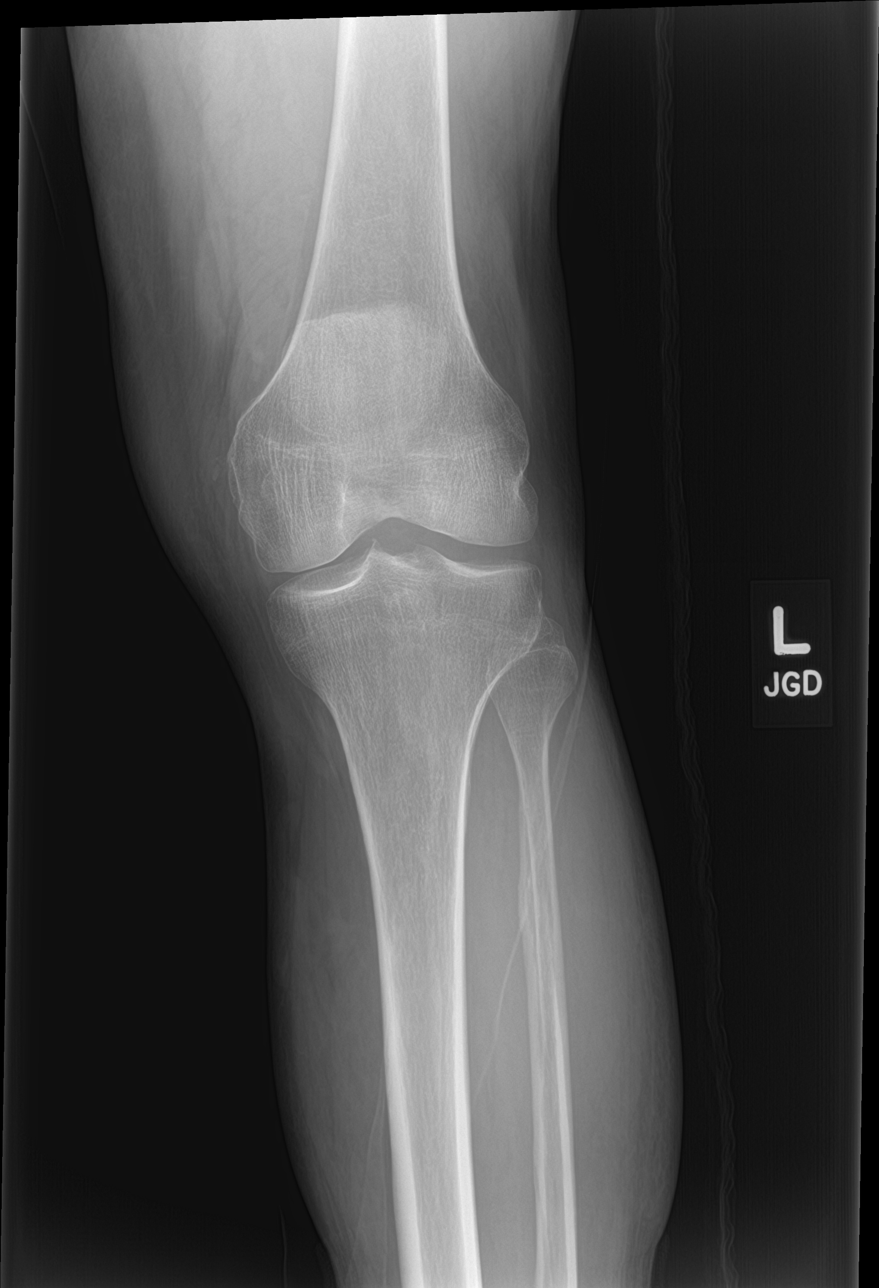

[knee obl]
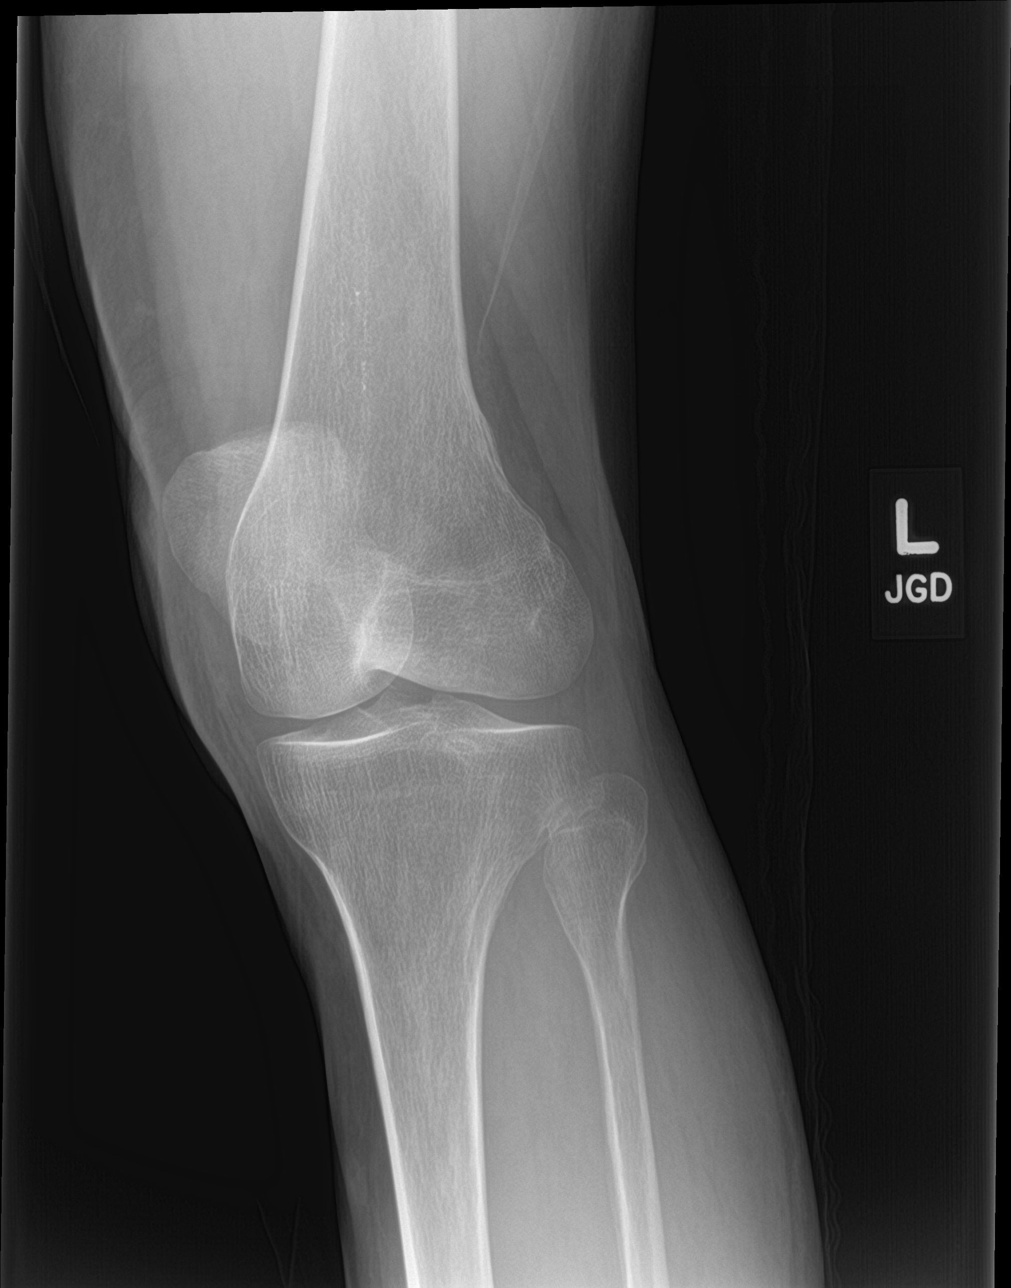

[knee lat]
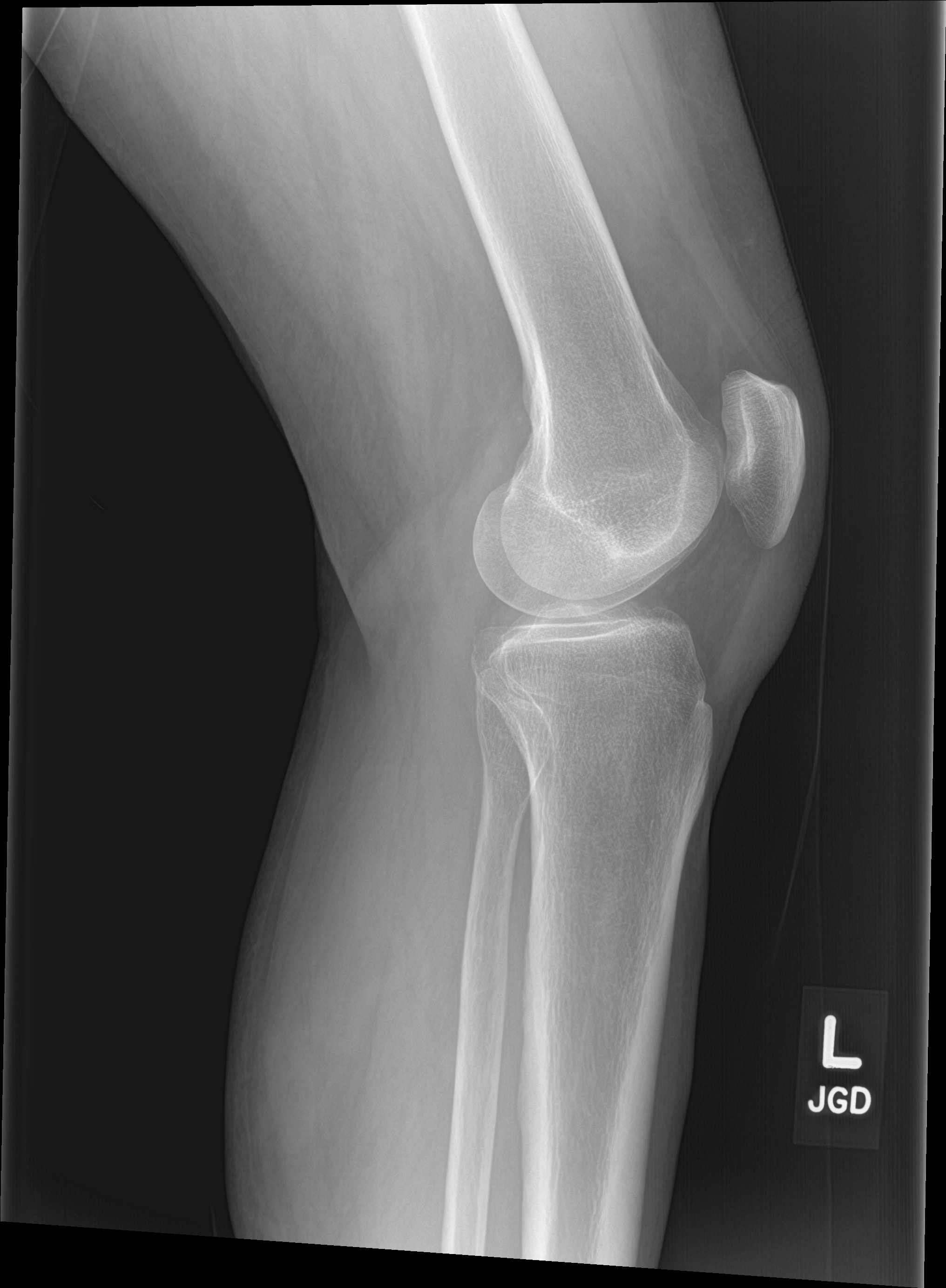

[knee sunrise]
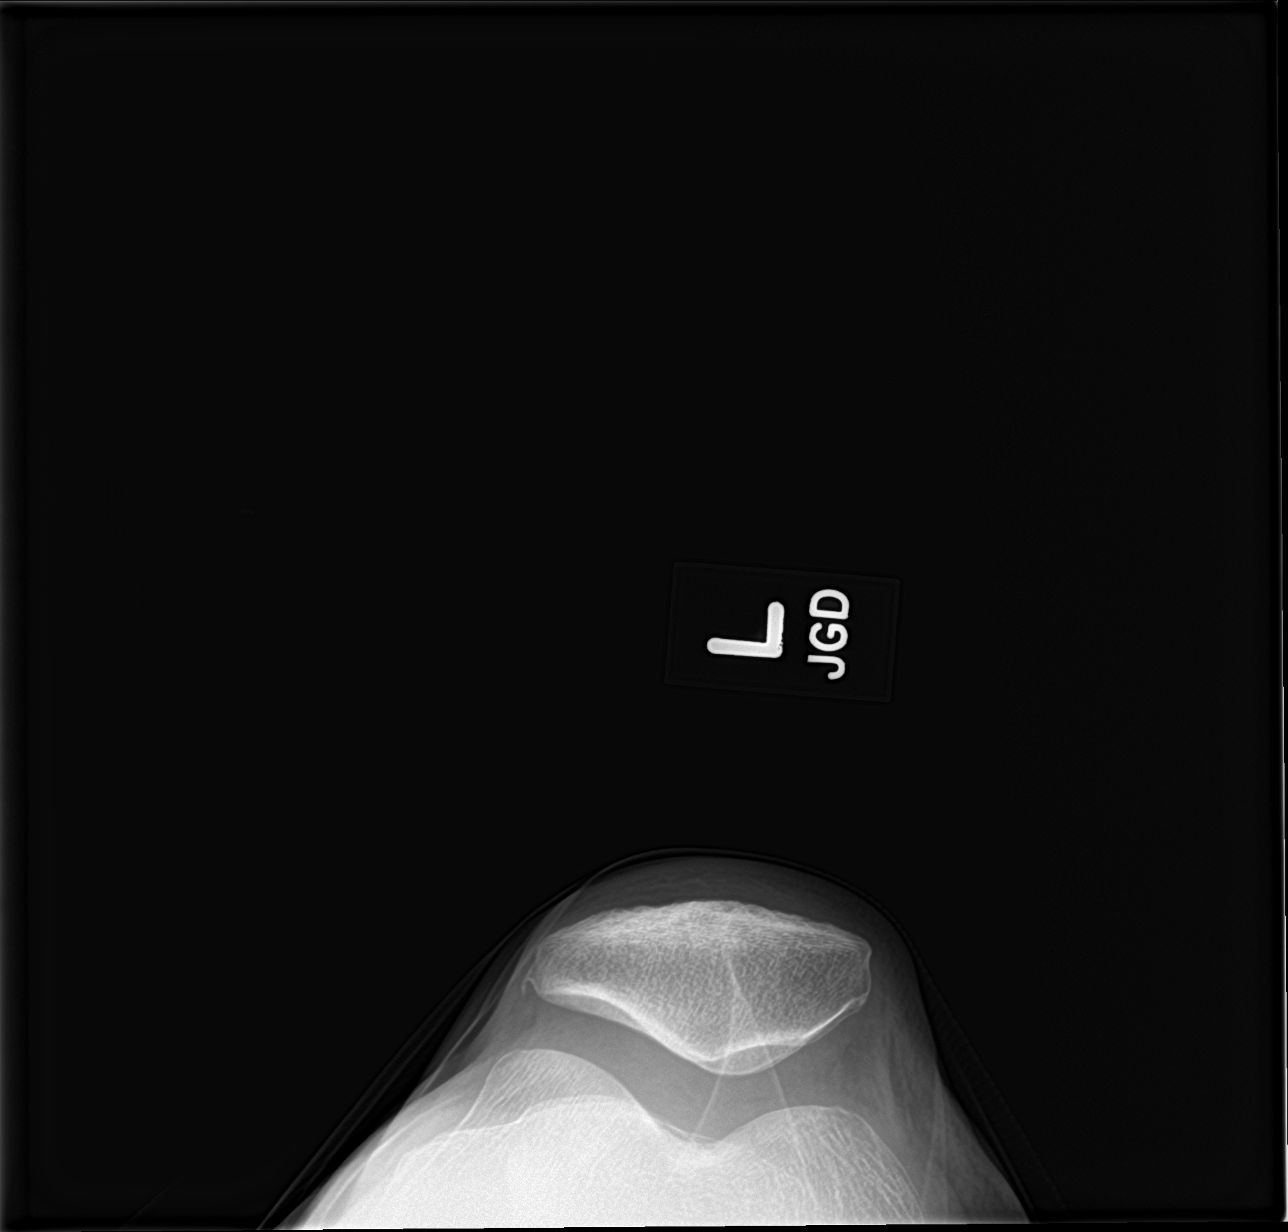

[4 of 4 positions shown; findings below may reference images not displayed]

FINDINGS: No acute fracture or dislocation. Joint spaces and alignment are
maintained. No area of erosion or osseous destruction. No unexpected
radiopaque foreign body. Soft tissues are unremarkable.
IMPRESSION: No acute fracture or dislocation.

## 2021-08-04 ENCOUNTER — Other Ambulatory Visit (HOSPITAL_COMMUNITY): Payer: Self-pay

## 2021-08-04 ENCOUNTER — Other Ambulatory Visit: Payer: Self-pay | Admitting: Cardiovascular Disease

## 2021-08-04 MED ORDER — SILDENAFIL CITRATE 50 MG PO TABS
ORAL_TABLET | ORAL | 2 refills | Status: DC
  Filled 2021-08-04: qty 6, 15d supply, fill #0
  Filled 2021-11-19: qty 6, 15d supply, fill #1
  Filled 2022-01-20: qty 6, 15d supply, fill #2

## 2021-08-05 ENCOUNTER — Other Ambulatory Visit (HOSPITAL_COMMUNITY): Payer: Self-pay

## 2021-10-06 ENCOUNTER — Encounter: Payer: Self-pay | Admitting: Family Medicine

## 2021-10-12 ENCOUNTER — Other Ambulatory Visit: Payer: Self-pay | Admitting: Family Medicine

## 2021-11-09 ENCOUNTER — Other Ambulatory Visit: Payer: Self-pay

## 2021-11-09 ENCOUNTER — Ambulatory Visit (INDEPENDENT_AMBULATORY_CARE_PROVIDER_SITE_OTHER)

## 2021-11-09 ENCOUNTER — Ambulatory Visit (HOSPITAL_COMMUNITY)
Admission: EM | Admit: 2021-11-09 | Discharge: 2021-11-09 | Disposition: A | Discharge status: Home / Self Care | Attending: Family Medicine | Admitting: Family Medicine

## 2021-11-09 ENCOUNTER — Encounter (HOSPITAL_COMMUNITY): Payer: Self-pay | Admitting: *Deleted

## 2021-11-09 DIAGNOSIS — M545 Low back pain, unspecified: Secondary | ICD-10-CM

## 2021-11-09 DIAGNOSIS — M47816 Spondylosis without myelopathy or radiculopathy, lumbar region: Secondary | ICD-10-CM | POA: Diagnosis not present

## 2021-11-09 MED ORDER — TIZANIDINE HCL 4 MG PO TABS: 4.0000 mg | ORAL_TABLET | Freq: Three times a day (TID) | ORAL | 0 refills | Status: DC | PRN

## 2021-11-09 NOTE — ED Triage Notes (Signed)
Pt reports back starting past Tuesday. Pt presents with back brace on.

## 2021-11-09 NOTE — Discharge Instructions (Addendum)
Your xrays did not show a new fracture.  Take tizanidine 4 mg 1 every 8 hours as needed for muscle spasm. It can make you sleepy.

## 2021-11-09 NOTE — ED Provider Notes (Signed)
[place]    CSN: [id number] Arrival date & time: 11/09/21  1332      History   Chief Complaint Chief Complaint  Patient presents with   Back Pain    HPI Wayne Molina is a 66 y.o. male.    Back Pain Here for low back pain. Began bothering him this time on 12/27, and then was worse some when he lifted some things on 12/30. Then today it got suddenly worse when he was getting ready for work. Does get worse when bending. Wearing a back brace from prior injury.  Does have a h/o vertebral fractures, and were insufficiency type (one happened when he sneezed)  No f/c/URI symptoms.  Pain is not radiating into leg. No B/B incontinence.  Past Medical History:  Diagnosis Date   Anxiety    Arthritis    fingers   Chronic atrial fibrillation ([place])    Depression    Family history of breast cancer    Family history of cancer of male genital organ    Family history of gene mutation    BRIP1   Family history of malignant neoplasm of gastrointestinal tract    Hyperlipidemia    Hypertension    Personal history of colonic polyps 11/12/2006   Sleep apnea     Patient Active Problem List   Diagnosis Date Noted   Genetic testing 03/19/2021   Family history of gene mutation    Family history of breast cancer    Family history of malignant neoplasm of gastrointestinal tract    Family history of cancer of male genital organ    Neck pain 06/12/2020   Peyronie disease 06/12/2020   Elevated bilirubin 05/26/2019   Lower urinary tract symptoms (LUTS) 05/24/2019   Osteoporosis 01/27/2017   Erectile dysfunction 01/29/2016   Syncope 01/01/2016   Lumbar vertebral fracture ([place]) 07/22/2015   Hyperlipidemia 07/22/2015   Myocardiopathy ([place]) 07/11/2013   Essential hypertension 05/23/2012   Sleep apnea: unable to tolerate CPAP 03/04/2011   Atrial fibrillation ([place]) 01/06/2007    Past Surgical History:  Procedure Laterality Date   2-D echocardiogram  07/22/2010   Ejection  fraction greater than 55%. Left atrium moderately dilated. Right atrium moderately dilated. 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 Mild to Moderate MR. Mild to moderate TR.   ATRIAL ABLATION SURGERY  2004   EP IMPLANTABLE DEVICE N/A 02/11/2016   Procedure: Loop Recorder Insertion;  Surgeon: Sanda Klein, MD;  Location: [facility];  Service: Cardiovascular;  Laterality: N/A;   Exercise Myoview stress test  07/08/2000   Nonischemic low-risk.   HIP FRACTURE SURGERY  1970s   IR GENERIC HISTORICAL  01/21/2017   IR RADIOLOGIST EVAL & MGMT 01/21/2017 [facility]       Home Medications    Prior to Admission medications   Medication Sig Start Date End Date Taking? Authorizing Provider  tiZANidine (ZANAFLEX) 4 MG tablet Take 1 tablet (4 mg total) by mouth every 8 (eight) hours as needed for muscle spasms. 11/09/21  Yes Barrett Henle, MD  alendronate (FOSAMAX) 70 MG tablet TAKE 1 TABLET ONE TIME WEEKLY AS DIRECTED. PLEASE MAKE AN APPOINTMENT FOR A PHYSICAL. 10/13/21   Lind Covert, MD  amLODipine (NORVASC) 2.5 MG tablet Take 2.5 mg by mouth daily.  08/23/19   [provider]  aspirin 81 MG tablet Take 81 mg by mouth daily.    [provider]  atorvastatin (LIPITOR) 40 MG tablet TAKE 1 TABLET (40 MG TOTAL) BY MOUTH  DAILY. 06/16/21   Lind Covert, MD  calcium carbonate (OSCAL) 1500 (600 Ca) MG TABS tablet Take by mouth 2 (two) times daily with a meal.    [provider]  ibuprofen (ADVIL,MOTRIN) 800 MG tablet Take 800 mg by mouth every 8 (eight) hours as needed for mild pain or moderate pain.    [provider]  losartan (COZAAR) 50 MG tablet TAKE 2 TABLETS (100 MG TOTAL) BY MOUTH DAILY. 10/13/21   Lind Covert, MD  Magnesium 250 MG TABS Take 1 tablet by mouth daily.    [provider]  MULTIPLE VITAMIN PO Take 1 tablet by mouth daily.    [provider]  Probiotic Product (PROBIOTIC DAILY PO) Take 46 mg by mouth. [employer]  probiotic 10 Patient not taking: Reported on 05/24/2020    [provider]  sildenafil (VIAGRA) 50 MG tablet TAKE 1 TABLET BY MOUTH DAILY AS NEEDED FOR ERECTILE DYSFUNCTION. 08/04/21   Croitoru, Mihai, MD  tamsulosin (FLOMAX) 0.4 MG CAPS capsule Take 1 capsule (0.4 mg total) by mouth daily. Please come in for a physical 12/18/20   Lind Covert, MD    Family History Family History  Problem Relation Age of Onset   Dementia Mother    Heart disease Father    Diabetes Father    Other Sister        BRIP1 gene mutation   Kidney disease Brother    Colon cancer Maternal Uncle    Lung cancer Paternal Aunt        d. >50   Cancer Paternal Aunt        unknown type, d. >50   Cancer Cousin 34       gynecologic (paternal first cousin)   Colon cancer Nephew 37       arising in colon polyp   Cancer Niece 67       gynecologic; niece in her 20's   Breast cancer Niece 2   Other Niece        BRIP1 gene mutation   Cancer Paternal Great-grandmother        abdominal cancer (PGF's mother) great grandmother    Social History Social History   Tobacco Use   Smoking status: Never   Smokeless tobacco: Never  Substance Use Topics   Alcohol use: No   Drug use: No     Allergies   Patient has no known allergies.   Review of Systems Review of Systems  Musculoskeletal:  Positive for back pain.    Physical Exam Triage Vital Signs ED Triage Vitals  Enc Vitals Group     BP 11/09/21 1514 (!) 172/101     Pulse Rate 11/09/21 1514 70     Resp 11/09/21 1514 18     Temp 11/09/21 1514 98.7 F (37.1 C)     Temp src --      SpO2 11/09/21 1514 98 %     Weight --      Height --      Head Circumference --      Peak Flow --      Pain Score 11/09/21 1511 5     Pain Loc --      Pain Edu? --      Excl. in [place]? --    No data found.  Updated Vital Signs BP (!) 172/101    Pulse 70    Temp 98.7 F (37.1 C)    Resp 18    SpO2 98%  Visual Acuity Right Eye Distance:   Left Eye  Distance:   Bilateral Distance:    Right Eye Near:   Left Eye Near:    Bilateral Near:     Physical Exam Vitals reviewed.  Constitutional:      General: He is not in acute distress.    Appearance: He is not toxic-appearing.  HENT:     Mouth/Throat:     Mouth: Mucous membranes are moist.  Cardiovascular:     Rate and Rhythm: Normal rate and regular rhythm.  Pulmonary:     Breath sounds: Normal breath sounds.  Musculoskeletal:        General: Tenderness (above LS area, and on left of lumbar spine.) present.  Skin:    Capillary Refill: Capillary refill takes less than 2 seconds.  Neurological:     General: No focal deficit present.     Mental Status: He is alert and oriented to person, place, and time.  Psychiatric:        Behavior: Behavior normal.     UC Treatments / Results  Labs (all labs ordered are listed, but only abnormal results are displayed) Labs Reviewed - No data to display  EKG   Radiology DG Lumbar Spine Complete  Result Date: 11/09/2021 CLINICAL DATA:  66 year old male with history of mid and left lower back pain. EXAM: LUMBAR SPINE - COMPLETE 4+ VIEW COMPARISON:  No priors. FINDINGS: Chronic appearing compression fractures of superior endplate of L2 and superior endplate of L4, most severe at L2 where there is 25% loss of anterior vertebral body height. No definite acute displaced fracture. Alignment is anatomic. Multilevel degenerative disc disease, most pronounced at L1-L2 and L3-L4. Moderate multilevel facet arthropathy. IMPRESSION: 1. Chronic appearing compression fractures of L2 and L4, as above. 2. No definite acute abnormality of the lumbar spine. 3. Multilevel degenerative disc disease and lumbar spondylosis, as above. Electronically Signed   By: Vinnie Langton M.D.   On: 11/09/2021 16:04    Procedures Procedures (including critical care time)  Medications Ordered in UC Medications - No data to display  Initial Impression / Assessment and Plan  / UC Course  I have reviewed the triage vital signs and the nursing notes.  Pertinent labs & imaging results that were available during my care of the patient were reviewed by me and considered in my medical decision making (see chart for details).     Xrays show the old L2 and L4 fractures, but no acute fracture.   Final Clinical Impressions(s) / UC Diagnoses   Final diagnoses:  Acute left-sided low back pain without sciatica     Discharge Instructions      Your xrays did not show a new fracture.  Take tizanidine 4 mg 1 every 8 hours as needed for muscle spasm. It can make you sleepy.      ED Prescriptions     Medication Sig Dispense Auth. Provider   tiZANidine (ZANAFLEX) 4 MG tablet Take 1 tablet (4 mg total) by mouth every 8 (eight) hours as needed for muscle spasms. 30 tablet Jarry Manon, Gwenlyn Perking, MD      I have reviewed the PDMP during this encounter.   Barrett Henle, MD 11/09/21 [phone]

## 2021-11-19 ENCOUNTER — Other Ambulatory Visit (HOSPITAL_COMMUNITY): Payer: Self-pay

## 2021-11-20 ENCOUNTER — Ambulatory Visit (INDEPENDENT_AMBULATORY_CARE_PROVIDER_SITE_OTHER): Payer: No Typology Code available for payment source | Admitting: Family Medicine

## 2021-11-20 ENCOUNTER — Other Ambulatory Visit: Payer: Self-pay

## 2021-11-20 VITALS — BP 115/75 | HR 79 | Ht 69.0 in | Wt 170.8 lb

## 2021-11-20 DIAGNOSIS — L720 Epidermal cyst: Secondary | ICD-10-CM

## 2021-11-20 NOTE — Patient Instructions (Signed)
It was nice seeing you today. We removed the cyst on your lower back without complication. Please, call our office if you have persistent pain, swelling, redness or discharge from the incision site. Otherwise, schedule a suture removal appointment with the nurse in the next 14 days.  Wound Care, Adult Taking care of your wound properly can help to prevent pain, infection, and scarring. It can also help your wound heal more quickly. Follow instructions from your health care provider about how to care for your wound. Supplies needed: Soap and water. Wound cleanser, saline, or germ-free (sterile) water. Gauze. If needed, a clean bandage (dressing) or other type of wound dressing material to cover or place in the wound. Follow your health care provider's instructions about what dressing supplies to use. Cream or topical ointment to apply to the wound, if told by your health care provider. How to care for your wound Cleaning the wound Ask your health care provider how to clean the wound. This may include: Using mild soap and water, a wound cleanser, saline, or sterile water. Using a clean gauze to pat the wound dry after cleaning it. Do not rub or scrub the wound. Dressing care Wash your hands with soap and water for at least 20 seconds before and after you change the dressing. If soap and water are not available, use hand sanitizer. Change your dressing as told by your health care provider. This may include: Cleaning or rinsing out (irrigating) the wound. Application of cream or topical ointment, if told by your health care provider. Placing a dressing over the wound or in the wound (packing). Covering the wound with an outer dressing. Leave stitches (sutures), staples, skin glue, or adhesive strips in place. These skin closures may need to stay in place for 2 weeks or longer. If adhesive strip edges start to loosen and curl up, you may trim the loose edges. Do not remove adhesive strips completely  unless your health care provider tells you to do that. Ask your health care provider when you can leave the wound uncovered. Checking for infection Check your wound area every day for signs of infection. Check for: More redness, swelling, or pain. Fluid or blood. Warmth. Pus or a bad smell.  Follow these instructions at home Medicines If you were prescribed an antibiotic medicine, cream, or ointment, take or apply it as told by your health care provider. Do not stop using the antibiotic even if your condition improves. If you were prescribed pain medicine, take it 30 minutes before you do any wound care or as told by your health care provider. Take over-the-counter and prescription medicines only as told by your health care provider. Eating and drinking Eat a diet that includes protein, vitamin A, vitamin C, and other nutrient-rich foods to help the wound heal. Foods rich in protein include meat, fish, eggs, dairy, beans, and nuts. Foods rich in vitamin A include carrots and dark green, leafy vegetables. Foods rich in vitamin C include citrus fruits, tomatoes, broccoli, and peppers. Drink enough fluid to keep your urine pale yellow. General instructions Do not take baths, swim, or use a hot tub until your health care provider approves. Ask your health care provider if you may take showers. You may only be allowed to take sponge baths. Do not scratch or pick at the wound. Keep it covered as told by your health care provider. Return to your normal activities as told by your health care provider. Ask your health care provider what activities  are safe for you. Protect your wound from the sun when you are outside for the first 6 months, or for as long as told by your health care provider. Cover up the scar area or apply sunscreen that has an SPF of at least 30. Do not use any products that contain nicotine or tobacco. These products include cigarettes, chewing tobacco, and vaping devices, such as  e-cigarettes. If you need help quitting, ask your health care provider. Keep all follow-up visits. This is important. Contact a health care provider if: You received a tetanus shot and you have swelling, severe pain, redness, or bleeding at the injection site. Your pain is not controlled with medicine. You have any of these signs of infection: More redness, swelling, or pain around the wound. Fluid or blood coming from the wound. Warmth coming from the wound. A fever or chills. You are nauseous or you vomit. You are dizzy. You have a new rash or hardness around the wound. Get help right away if: You have a red streak of skin near the area around your wound. Pus or a bad smell coming from the wound. Your wound has been closed with staples, sutures, skin glue, or adhesive strips and it begins to open up and separate. Your wound is bleeding, and the bleeding does not stop with gentle pressure. These symptoms may represent a serious problem that is an emergency. Do not wait to see if the symptoms will go away. Get medical help right away. Call your local emergency services (911 in the [place] Do not drive yourself to the hospital. Summary Always wash your hands with soap and water for at least 20 seconds before and after changing your dressing. Change your dressing as told by your health care provider. To help with healing, eat foods that are rich in protein, vitamin A, vitamin C, and other nutrients. Check your wound every day for signs of infection. Contact your health care provider if you think that your wound is infected. This information is not intended to replace advice given to you by your health care provider. Make sure you discuss any questions you have with your health care provider. Document Revised: 03/04/2021 Document Reviewed: 03/04/2021 Elsevier Patient Education  2022 [employer].

## 2021-11-20 NOTE — Progress Notes (Signed)
° ° °  SUBJECTIVE:   CHIEF COMPLAINT / HPI: [facility]  Cyst x 25-30 years. Wife would sometimes drain them by squeezing them. Has not drained lately. More irritating lately and would like it removed. Denies fever, chills.  PERTINENT  PMH / PSH: A Fib not on anticoagulation, HTN  OBJECTIVE:   BP 115/75    Pulse 79    Ht 5\' 9"  (1.753 m)    Wt 170 lb 12.8 oz (77.5 kg)    SpO2 98%    BMI 25.22 kg/m   General: alert, NAD CV: regular rate Pulm: no respiratory distress Derm: approximately 1 cm firm cystic lesion with pinpoint central hyperpigmented area located on mid left back slightly lateral to spine     ASSESSMENT/PLAN:  Inclusion Epidermoid Cyst: Benign with no signs of infection. Excision discussed vs monitoring. Patient opted for removal. Written consent was obtained and copy placed in chart. See procedure note below.   Excision of Benign Skin Lesion Procedure Note  PRE-OP DIAGNOSIS: Inclusion epidermoid cyst  POST-OP DIAGNOSIS: Same   PROCEDURE: skin lesion excision  Performing Physician: , MD  Supervising Physician (if applicable): Littie Deeds, MD  PROCEDURE:  Epidermoid cyst removal  The area surrounding the skin lesion was prepared and draped in the  usual sterile manner. The lesion was removed in the usual manner using 15 blade scalpel. Hemostasis was assured.  Closure:         _  suture _                     Followup: The patient tolerated the procedure well without  complications.  Standard post-procedure care is explained and return  precautions are given.           [name], MD [facility]

## 2021-12-04 ENCOUNTER — Ambulatory Visit: Payer: TRICARE For Life (TFL)

## 2021-12-04 ENCOUNTER — Other Ambulatory Visit: Payer: Self-pay

## 2021-12-04 DIAGNOSIS — Z4802 Encounter for removal of sutures: Secondary | ICD-10-CM

## 2021-12-04 NOTE — Progress Notes (Signed)
Patient presents for suture removal.  The wound is well healed without signs of infection.   Wound care and activity instructions given.

## 2022-01-20 ENCOUNTER — Other Ambulatory Visit: Payer: Self-pay | Admitting: Family Medicine

## 2022-01-20 ENCOUNTER — Other Ambulatory Visit (HOSPITAL_COMMUNITY): Payer: Self-pay

## 2022-01-20 MED ORDER — ALENDRONATE SODIUM 70 MG PO TABS
ORAL_TABLET | ORAL | 0 refills | Status: DC
  Filled 2022-01-20: qty 12, 84d supply, fill #0

## 2022-02-16 ENCOUNTER — Other Ambulatory Visit: Payer: Self-pay

## 2022-02-20 ENCOUNTER — Other Ambulatory Visit (HOSPITAL_COMMUNITY): Payer: Self-pay

## 2022-02-20 ENCOUNTER — Ambulatory Visit (INDEPENDENT_AMBULATORY_CARE_PROVIDER_SITE_OTHER): Payer: No Typology Code available for payment source

## 2022-02-20 ENCOUNTER — Ambulatory Visit (INDEPENDENT_AMBULATORY_CARE_PROVIDER_SITE_OTHER): Payer: No Typology Code available for payment source | Admitting: Family Medicine

## 2022-02-20 VITALS — BP 142/69 | HR 71 | Ht 69.0 in | Wt 164.0 lb

## 2022-02-20 DIAGNOSIS — H109 Unspecified conjunctivitis: Secondary | ICD-10-CM | POA: Diagnosis not present

## 2022-02-20 DIAGNOSIS — Z23 Encounter for immunization: Secondary | ICD-10-CM | POA: Diagnosis not present

## 2022-02-20 MED ORDER — POLYMYXIN B-TRIMETHOPRIM 10000-0.1 UNIT/ML-% OP SOLN
1.0000 [drp] | OPHTHALMIC | 0 refills | Status: DC
Start: 2022-02-20 — End: 2022-05-26
  Filled 2022-02-20: qty 10, 34d supply, fill #0

## 2022-02-20 NOTE — Patient Instructions (Signed)
Stop the pharmacy to pick up your eye drops.  ?
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
## 2022-02-26 ENCOUNTER — Other Ambulatory Visit (HOSPITAL_COMMUNITY): Payer: Self-pay

## 2022-02-26 ENCOUNTER — Other Ambulatory Visit: Payer: Self-pay | Admitting: Family Medicine

## 2022-02-26 MED ORDER — ATORVASTATIN CALCIUM 40 MG PO TABS
40.0000 mg | ORAL_TABLET | Freq: Every day | ORAL | 0 refills | Status: DC
  Filled 2022-02-26: qty 90, 90d supply, fill #0

## 2022-03-10 ENCOUNTER — Other Ambulatory Visit (HOSPITAL_COMMUNITY): Payer: Self-pay

## 2022-03-10 ENCOUNTER — Other Ambulatory Visit: Payer: Self-pay | Admitting: Cardiovascular Disease

## 2022-03-10 MED ORDER — SILDENAFIL CITRATE 50 MG PO TABS
50.0000 mg | ORAL_TABLET | Freq: Every day | ORAL | 2 refills | Status: DC | PRN
Start: 2022-03-10 — End: 2022-05-26
  Filled 2022-03-10: qty 6, 6d supply, fill #0
  Filled 2022-04-13: qty 6, 30d supply, fill #1

## 2022-04-13 ENCOUNTER — Other Ambulatory Visit: Payer: Self-pay | Admitting: Family Medicine

## 2022-04-13 ENCOUNTER — Other Ambulatory Visit (HOSPITAL_COMMUNITY): Payer: Self-pay

## 2022-04-13 MED ORDER — ALENDRONATE SODIUM 70 MG PO TABS
70.0000 mg | ORAL_TABLET | ORAL | 0 refills | Status: DC
  Filled 2022-04-13: qty 8, 56d supply, fill #0

## 2022-04-14 ENCOUNTER — Other Ambulatory Visit (HOSPITAL_COMMUNITY): Payer: Self-pay

## 2022-04-14 ENCOUNTER — Encounter: Payer: Self-pay | Admitting: *Deleted

## 2022-04-30 ENCOUNTER — Other Ambulatory Visit (HOSPITAL_COMMUNITY): Payer: Self-pay

## 2022-04-30 ENCOUNTER — Other Ambulatory Visit: Payer: Self-pay | Admitting: Family Medicine

## 2022-05-01 ENCOUNTER — Other Ambulatory Visit (HOSPITAL_COMMUNITY): Payer: Self-pay

## 2022-05-01 ENCOUNTER — Encounter: Payer: Self-pay | Admitting: Family Medicine

## 2022-05-01 MED ORDER — LOSARTAN POTASSIUM 50 MG PO TABS
100.0000 mg | ORAL_TABLET | Freq: Every day | ORAL | 0 refills | Status: DC
  Filled 2022-05-01: qty 60, 30d supply, fill #0

## 2022-05-25 NOTE — Progress Notes (Signed)
    SUBJECTIVE:   CHIEF COMPLAINT / HPI:   Osteoporosis History of vertebral fracture.   Started fosamax 2018.  Exercises with weights regularly.   Hypertension Did not bring medications.  Thinks taking losartan and probably amlodipine regularly.  Has blood pressure cuff but does not check regularly.  No edema or exertional chest pain   LUTs  Feels that flomax 2 tabs did not help.  Takes him a long time to urinate.  Would like to see urology  PERTINENT  PMH / PSH: has not seen cardiology for a while.  OBJECTIVE:   BP (!) 156/73   Pulse 73   Wt 163 lb 12.8 oz (74.3 kg)   SpO2 99%   BMI 24.19 kg/m   Heart - Regular rate and rhythm.  No murmurs, gallops or rubs.    Lungs:  Normal respiratory effort, chest expands symmetrically. Lungs are clear to auscultation, no crackles or wheezes. Extremities:  No cyanosis, edema, or deformity noted with good range of motion of all major joints.   Mobility:able to get up and down from exam table without assistance or distress   ASSESSMENT/PLAN:   Essential hypertension Not at goal.  Asked him to make sure he is taking all medications as per his list.  Follow his blood pressure at home.  Contact me if high.   Will check CMET  Osteoporosis STable.  Has taking 5 years of bisphosphonate.  Will check bone density   Lower urinary tract symptoms (LUTS) Worsened and flomax did not help.  Will refer to urology.  Check PSA today   Hyperlipidemia Stable.  Check lipid profile   Atrial fibrillation (HCC) Suggest he follow up with cadiology for discussion of anticoagulation    Patient Instructions  Good to see you today - Thank you for coming in  Things we discussed today:  Your goal blood pressure is less than 140/90.  Check your blood pressure several times a week.  If regularly higher than this please let me know - either with MyChart or leaving a phone message. Next visit please bring in your blood pressure cuff.     Make an appointment  with your cardiologist - bring the blood pressure cuff to them  We will order a bone density - they should contact you  Check to see if you are taking all the medications on your list   I have put in a referral to Urologist.  They should call you to schedule an appointment.  This could appear as an unknown number on your phone.  If you have not heard from them in 2 weeks please let me know.   I sent a prescription to your pharmacy for your Zoster vaccines to help prevent Shingles.  The shot may cause a sore arm and mild flu like symptoms for a few days.  You will need a second shot 2 months after your first.    I will call you if your tests are not good.  Otherwise, I will send you a message on MyChart (if it is active) or a letter in the mail..  If you do not hear from me with in 2 weeks please call our office.     Please always bring your medication bottles  Come back to see me in 6months  Carney Living, MD [facility]

## 2022-05-25 NOTE — Patient Instructions (Signed)
Good to see you today - Thank you for coming in  Things we discussed today:  Your goal blood pressure is less than 140/90.  Check your blood pressure several times a week.  If regularly higher than this please let me know - either with MyChart or leaving a phone message. Next visit please bring in your blood pressure cuff.     Make an appointment with your cardiologist - bring the blood pressure cuff to them  We will order a bone density - they should contact you  Check to see if you are taking all the medications on your list   I have put in a referral to Urologist.  They should call you to schedule an appointment.  This could appear as an unknown number on your phone.  If you have not heard from them in 2 weeks please let me know.   I sent a prescription to your pharmacy for your Zoster vaccines to help prevent Shingles.  The shot may cause a sore arm and mild flu like symptoms for a few days.  You will need a second shot 2 months after your first.    I will call you if your tests are not good.  Otherwise, I will send you a message on MyChart (if it is active) or a letter in the mail..  If you do not hear from me with in 2 weeks please call our office.     Please always bring your medication bottles  Come back to see me in 45months

## 2022-05-26 ENCOUNTER — Encounter: Payer: Self-pay | Admitting: Family Medicine

## 2022-05-26 ENCOUNTER — Other Ambulatory Visit: Payer: Self-pay

## 2022-05-26 ENCOUNTER — Other Ambulatory Visit (HOSPITAL_COMMUNITY): Payer: Self-pay

## 2022-05-26 ENCOUNTER — Ambulatory Visit (INDEPENDENT_AMBULATORY_CARE_PROVIDER_SITE_OTHER): Payer: No Typology Code available for payment source | Admitting: Family Medicine

## 2022-05-26 ENCOUNTER — Telehealth: Payer: Self-pay

## 2022-05-26 VITALS — BP 156/73 | HR 73 | Wt 163.8 lb

## 2022-05-26 DIAGNOSIS — I10 Essential (primary) hypertension: Secondary | ICD-10-CM

## 2022-05-26 DIAGNOSIS — E785 Hyperlipidemia, unspecified: Secondary | ICD-10-CM

## 2022-05-26 DIAGNOSIS — Z125 Encounter for screening for malignant neoplasm of prostate: Secondary | ICD-10-CM | POA: Diagnosis not present

## 2022-05-26 DIAGNOSIS — R399 Unspecified symptoms and signs involving the genitourinary system: Secondary | ICD-10-CM | POA: Diagnosis not present

## 2022-05-26 DIAGNOSIS — I4821 Permanent atrial fibrillation: Secondary | ICD-10-CM

## 2022-05-26 DIAGNOSIS — M8080XA Other osteoporosis with current pathological fracture, unspecified site, initial encounter for fracture: Secondary | ICD-10-CM | POA: Diagnosis not present

## 2022-05-26 MED ORDER — AMLODIPINE BESYLATE 2.5 MG PO TABS
2.5000 mg | ORAL_TABLET | Freq: Every day | ORAL | 0 refills | Status: DC
  Filled 2022-05-26: qty 90, 90d supply, fill #0

## 2022-05-26 MED ORDER — SILDENAFIL CITRATE 50 MG PO TABS
100.0000 mg | ORAL_TABLET | Freq: Every day | ORAL | 2 refills | Status: DC | PRN
  Filled 2022-05-26: qty 6, 30d supply, fill #0
  Filled 2022-12-08: qty 6, 30d supply, fill #1
  Filled 2023-03-17: qty 6, 30d supply, fill #2
  Filled 2023-05-03: qty 6, 30d supply, fill #3

## 2022-05-26 MED ORDER — LOSARTAN POTASSIUM 100 MG PO TABS
100.0000 mg | ORAL_TABLET | Freq: Every day | ORAL | 1 refills | Status: DC
  Filled 2022-05-26: qty 90, 90d supply, fill #0
  Filled 2022-09-01: qty 90, 90d supply, fill #1

## 2022-05-26 MED ORDER — ZOSTER VAC RECOMB ADJUVANTED 50 MCG/0.5ML IM SUSR
0.5000 mL | INTRAMUSCULAR | 1 refills | Status: DC
  Filled 2022-05-26: qty 0.5, 1d supply, fill #0
  Filled 2022-05-26: qty 0.5, fill #0
  Filled 2022-08-10: qty 0.5, 1d supply, fill #1

## 2022-05-26 NOTE — Telephone Encounter (Signed)
Patient calls nurse line requesting a refill on Amlodipine 2.5mg .   Patient reports he left this off his list this morning during apt. However, patient does report he has not been on the medication for a "few months."   Will forward to PCP.

## 2022-05-26 NOTE — Assessment & Plan Note (Signed)
STable.  Has taking 5 years of bisphosphonate.  Will check bone density

## 2022-05-26 NOTE — Assessment & Plan Note (Signed)
Suggest he follow up with cadiology for discussion of anticoagulation

## 2022-05-26 NOTE — Assessment & Plan Note (Signed)
Worsened and flomax did not help.  Will refer to urology.  Check PSA today

## 2022-05-26 NOTE — Assessment & Plan Note (Addendum)
Not at goal.  Asked him to make sure he is taking all medications as per his list.  Follow his blood pressure at home.  Contact me if high.   Will check CMET

## 2022-05-26 NOTE — Addendum Note (Signed)
Addended by: Pearlean Brownie L on: 05/26/2022 11:03 AM   Modules accepted: Orders

## 2022-05-26 NOTE — Assessment & Plan Note (Signed)
Stable Check lipid profile 

## 2022-05-27 LAB — CMP14+EGFR
ALT: 25 IU/L (ref 0–44)
AST: 35 IU/L (ref 0–40)
Albumin/Globulin Ratio: 1.7 (ref 1.2–2.2)
Albumin: 4.8 g/dL (ref 3.9–4.9)
Alkaline Phosphatase: 56 IU/L (ref 44–121)
BUN/Creatinine Ratio: 22 (ref 10–24)
BUN: 24 mg/dL (ref 8–27)
Bilirubin Total: 1.7 mg/dL — ABNORMAL HIGH (ref 0.0–1.2)
CO2: 20 mmol/L (ref 20–29)
Calcium: 9.8 mg/dL (ref 8.6–10.2)
Chloride: 102 mmol/L (ref 96–106)
Creatinine, Ser: 1.09 mg/dL (ref 0.76–1.27)
Globulin, Total: 2.9 g/dL (ref 1.5–4.5)
Glucose: 90 mg/dL (ref 70–99)
Potassium: 5.1 mmol/L (ref 3.5–5.2)
Sodium: 139 mmol/L (ref 134–144)
Total Protein: 7.7 g/dL (ref 6.0–8.5)
eGFR: 74 mL/min/{1.73_m2} (ref >59)

## 2022-05-27 LAB — LIPID PANEL
Chol/HDL Ratio: 2.6 ratio (ref 0.0–5.0)
Cholesterol, Total: 130 mg/dL (ref 100–199)
HDL: 50 mg/dL (ref >39)
LDL Chol Calc (NIH): 66 mg/dL (ref 0–99)
Triglycerides: 68 mg/dL (ref 0–149)
VLDL Cholesterol Cal: 14 mg/dL (ref 5–40)

## 2022-05-27 LAB — PSA: Prostate Specific Ag, Serum: 0.7 ng/mL (ref 0.0–4.0)

## 2022-06-02 ENCOUNTER — Other Ambulatory Visit (HOSPITAL_COMMUNITY): Payer: Self-pay

## 2022-06-02 ENCOUNTER — Other Ambulatory Visit: Payer: Self-pay | Admitting: Family Medicine

## 2022-06-02 MED ORDER — ALENDRONATE SODIUM 70 MG PO TABS
70.0000 mg | ORAL_TABLET | ORAL | 0 refills | Status: DC
  Filled 2022-06-02: qty 8, 56d supply, fill #0

## 2022-06-02 MED ORDER — ATORVASTATIN CALCIUM 40 MG PO TABS
40.0000 mg | ORAL_TABLET | Freq: Every day | ORAL | 3 refills | Status: DC
  Filled 2022-06-02: qty 90, 90d supply, fill #0
  Filled 2022-09-01: qty 90, 90d supply, fill #1
  Filled 2022-12-17: qty 90, 90d supply, fill #2
  Filled 2023-03-17: qty 90, 90d supply, fill #3

## 2022-06-03 ENCOUNTER — Other Ambulatory Visit (HOSPITAL_COMMUNITY): Payer: Self-pay

## 2022-06-10 ENCOUNTER — Emergency Department (HOSPITAL_COMMUNITY): Payer: TRICARE For Life (TFL)

## 2022-06-10 ENCOUNTER — Observation Stay (HOSPITAL_COMMUNITY)
Admission: EM | Admit: 2022-06-10 | Discharge: 2022-06-12 | Disposition: A | Discharge status: Home / Self Care | Payer: No Typology Code available for payment source | Attending: Internal Medicine | Admitting: Internal Medicine

## 2022-06-10 ENCOUNTER — Ambulatory Visit
Admission: EM | Admit: 2022-06-10 | Discharge: 2022-06-10 | Disposition: A | Discharge status: Home / Self Care | Payer: TRICARE For Life (TFL) | Attending: Physician Assistant | Admitting: Physician Assistant

## 2022-06-10 ENCOUNTER — Encounter (HOSPITAL_COMMUNITY): Payer: Self-pay | Admitting: Emergency Medicine

## 2022-06-10 DIAGNOSIS — Z7901 Long term (current) use of anticoagulants: Secondary | ICD-10-CM | POA: Insufficient documentation

## 2022-06-10 DIAGNOSIS — Z7982 Long term (current) use of aspirin: Secondary | ICD-10-CM | POA: Diagnosis not present

## 2022-06-10 DIAGNOSIS — R42 Dizziness and giddiness: Secondary | ICD-10-CM

## 2022-06-10 DIAGNOSIS — Z79899 Other long term (current) drug therapy: Secondary | ICD-10-CM | POA: Insufficient documentation

## 2022-06-10 DIAGNOSIS — R531 Weakness: Secondary | ICD-10-CM | POA: Diagnosis not present

## 2022-06-10 DIAGNOSIS — R519 Headache, unspecified: Secondary | ICD-10-CM

## 2022-06-10 DIAGNOSIS — R55 Syncope and collapse: Principal | ICD-10-CM | POA: Insufficient documentation

## 2022-06-10 DIAGNOSIS — I4821 Permanent atrial fibrillation: Secondary | ICD-10-CM | POA: Insufficient documentation

## 2022-06-10 DIAGNOSIS — G4733 Obstructive sleep apnea (adult) (pediatric): Secondary | ICD-10-CM | POA: Diagnosis not present

## 2022-06-10 DIAGNOSIS — I10 Essential (primary) hypertension: Secondary | ICD-10-CM | POA: Diagnosis not present

## 2022-06-10 DIAGNOSIS — I4811 Longstanding persistent atrial fibrillation: Secondary | ICD-10-CM | POA: Diagnosis not present

## 2022-06-10 LAB — BASIC METABOLIC PANEL
Anion gap: 6 (ref 5–15)
BUN: 22 mg/dL (ref 8–23)
CO2: 26 mmol/L (ref 22–32)
Calcium: 9.9 mg/dL (ref 8.9–10.3)
Chloride: 104 mmol/L (ref 98–111)
Creatinine, Ser: 1.02 mg/dL (ref 0.61–1.24)
GFR, Estimated: >60 mL/min (ref >60)
Glucose, Bld: 101 mg/dL — ABNORMAL HIGH (ref 70–99)
Potassium: 4 mmol/L (ref 3.5–5.1)
Sodium: 136 mmol/L (ref 135–145)

## 2022-06-10 LAB — URINALYSIS, ROUTINE W REFLEX MICROSCOPIC
Bacteria, UA: NONE SEEN
Bilirubin Urine: NEGATIVE
Glucose, UA: NEGATIVE mg/dL
Hgb urine dipstick: NEGATIVE
Ketones, ur: 20 mg/dL — AB
Leukocytes,Ua: NEGATIVE
Nitrite: NEGATIVE
Protein, ur: 30 mg/dL — AB
Specific Gravity, Urine: 1.01 (ref 1.005–1.030)
pH: 5 (ref 5.0–8.0)

## 2022-06-10 LAB — CBG MONITORING, ED: Glucose-Capillary: 101 mg/dL — ABNORMAL HIGH (ref 70–99)

## 2022-06-10 LAB — TROPONIN I (HIGH SENSITIVITY)
Troponin I (High Sensitivity): 22 ng/L — ABNORMAL HIGH (ref <18)
Troponin I (High Sensitivity): 29 ng/L — ABNORMAL HIGH (ref <18)

## 2022-06-10 LAB — CBC
HCT: 46.2 % (ref 39.0–52.0)
Hemoglobin: 15.6 g/dL (ref 13.0–17.0)
MCH: 30.9 pg (ref 26.0–34.0)
MCHC: 33.8 g/dL (ref 30.0–36.0)
MCV: 91.5 fL (ref 80.0–100.0)
Platelets: 235 10*3/uL (ref 150–400)
RBC: 5.05 MIL/uL (ref 4.22–5.81)
RDW: 12.7 % (ref 11.5–15.5)
WBC: 8.8 10*3/uL (ref 4.0–10.5)
nRBC: 0 % (ref 0.0–0.2)

## 2022-06-10 LAB — D-DIMER, QUANTITATIVE: D-Dimer, Quant: 0.27 ug/mL-FEU (ref 0.00–0.50)

## 2022-06-10 MED ORDER — HEPARIN SODIUM (PORCINE) 5000 UNIT/ML IJ SOLN
5000.0000 [IU] | Freq: Three times a day (TID) | INTRAMUSCULAR | Status: DC
  Administered 2022-06-11: 5000 [IU] via SUBCUTANEOUS
  Filled 2022-06-10 (×2): qty 1

## 2022-06-10 MED ORDER — ACETAMINOPHEN 325 MG PO TABS: 650.0000 mg | ORAL_TABLET | Freq: Four times a day (QID) | ORAL | Status: DC | PRN

## 2022-06-10 MED ORDER — ACETAMINOPHEN 650 MG RE SUPP: 650.0000 mg | Freq: Four times a day (QID) | RECTAL | Status: DC | PRN

## 2022-06-10 MED ORDER — ONDANSETRON HCL 4 MG PO TABS: 4.0000 mg | ORAL_TABLET | Freq: Four times a day (QID) | ORAL | Status: DC | PRN

## 2022-06-10 MED ORDER — LACTATED RINGERS IV BOLUS
1000.0000 mL | Freq: Once | INTRAVENOUS | Status: AC
Start: 2022-06-10 — End: 2022-06-10
  Administered 2022-06-10: 1000 mL via INTRAVENOUS

## 2022-06-10 MED ORDER — SODIUM CHLORIDE 0.9% FLUSH
3.0000 mL | Freq: Two times a day (BID) | INTRAVENOUS | Status: DC
  Administered 2022-06-11 – 2022-06-12 (×3): 3 mL via INTRAVENOUS

## 2022-06-10 MED ORDER — ONDANSETRON HCL 4 MG/2ML IJ SOLN: 4.0000 mg | Freq: Four times a day (QID) | INTRAMUSCULAR | Status: DC | PRN

## 2022-06-10 MED ORDER — SODIUM CHLORIDE 0.9 % IV SOLN: INTRAVENOUS | Status: AC

## 2022-06-10 NOTE — ED Notes (Signed)
Awaiting patients ride to take him to the emergency room (per provider).

## 2022-06-10 NOTE — ED Notes (Signed)
Patient is being discharged from the Urgent Care and sent to the Emergency Department via POA with friend. Per Erma Pinto PA, patient is in need of higher level of care due to need for further evaluation . Patient is aware and verbalizes understanding of plan of care.  Vitals:   06/10/22 1200  BP: 132/76  Pulse: 84  Resp: 16  Temp: 97.6 F (36.4 C)  SpO2: 96%

## 2022-06-10 NOTE — ED Notes (Signed)
Attending at the bedside.

## 2022-06-10 NOTE — ED Provider Notes (Signed)
[facility]    CSN: [id number] Arrival date & time: 06/10/22  1157      History   Chief Complaint Chief Complaint  Patient presents with   Dizziness    HPI KENTAVIUS DETTORE is a 66 y.o. male.   Patient here today for evaluation of lightheadedness and mild headache that started today as he was walking to his mailbox.  He does have history of A-fib that is persistent and is not currently anticoagulated.  He denies any chest pain or shortness of breath.  He denies any numbness or tingling.  He does not report any treatment for symptoms.  He does note that initially he thought his blood pressure dropped but when he checked his blood pressure when he was symptomatic blood pressure was within normal limits.  He did have some nausea but this is improved somewhat.  He does continue to feel lightheaded.  The history is provided by the patient.  Dizziness Associated symptoms: headaches and nausea   Associated symptoms: no chest pain, no shortness of breath and no vomiting     Past Medical History:  Diagnosis Date   Anxiety    Arthritis    fingers   Chronic atrial fibrillation ([place])    Depression    Family history of breast cancer    Family history of cancer of male genital organ    Family history of gene mutation    BRIP1   Family history of malignant neoplasm of gastrointestinal tract    Hyperlipidemia    Hypertension    Personal history of colonic polyps 11/12/2006   Sleep apnea     Patient Active Problem List   Diagnosis Date Noted   Genetic testing 03/19/2021   Family history of gene mutation    Family history of breast cancer    Family history of malignant neoplasm of gastrointestinal tract    Family history of cancer of male genital organ    Neck pain 06/12/2020   Peyronie disease 06/12/2020   Elevated bilirubin 05/26/2019   Lower urinary tract symptoms (LUTS) 05/24/2019   Osteoporosis 01/27/2017   Erectile dysfunction 01/29/2016   Syncope 01/01/2016    Lumbar vertebral fracture ([place]) 07/22/2015   Hyperlipidemia 07/22/2015   Myocardiopathy ([place]) 07/11/2013   Essential hypertension 05/23/2012   Sleep apnea: unable to tolerate CPAP 03/04/2011   Atrial fibrillation ([place]) 01/06/2007    Past Surgical History:  Procedure Laterality Date   2-D echocardiogram  07/22/2010   Ejection fraction greater than 55%. Left atrium moderately dilated. Right atrium moderately dilated. 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 Mild to Moderate MR. Mild to moderate TR.   ATRIAL ABLATION SURGERY  2004   EP IMPLANTABLE DEVICE N/A 02/11/2016   Procedure: Loop Recorder Insertion;  Surgeon: Sanda Klein, MD;  Location: [facility];  Service: Cardiovascular;  Laterality: N/A;   Exercise Myoview stress test  07/08/2000   Nonischemic low-risk.   HIP FRACTURE SURGERY  1970s   IR GENERIC HISTORICAL  01/21/2017   IR RADIOLOGIST EVAL & MGMT 01/21/2017 [facility]       Home Medications    Prior to Admission medications   Medication Sig Start Date End Date Taking? Authorizing Provider  alendronate (FOSAMAX) 70 MG tablet Take 1 tablet (70 mg total) by mouth once a week. Take with a full glass of water on an empty stomach. 06/02/22   Lind Covert, MD  amLODipine (NORVASC) 2.5 MG tablet Take 1 tablet (2.5 mg total) by mouth daily. 05/26/22  Lind Covert, MD  aspirin 81 MG tablet Take 81 mg by mouth daily.    [provider]  atorvastatin (LIPITOR) 40 MG tablet Take 1 tablet (40 mg total) by mouth daily. 06/02/22   Lind Covert, MD  calcium carbonate (OSCAL) 1500 (600 Ca) MG TABS tablet Take by mouth 2 (two) times daily with a meal.    [provider]  ibuprofen (ADVIL,MOTRIN) 800 MG tablet Take 800 mg by mouth every 8 (eight) hours as needed for mild pain or moderate pain.    [provider]  losartan (COZAAR) 100 MG tablet Take 1 tablet (100 mg total) by mouth daily. 05/26/22   Lind Covert, MD  Magnesium 250 MG  TABS Take 1 tablet by mouth daily.    [provider]  MULTIPLE VITAMIN PO Take 1 tablet by mouth daily.    [provider]  sildenafil (VIAGRA) 50 MG tablet Take 2 tablets (100 mg total) by mouth daily as needed. 05/26/22   Lind Covert, MD  tiZANidine (ZANAFLEX) 4 MG tablet Take 1 tablet (4 mg total) by mouth every 8 (eight) hours as needed for muscle spasms. Patient not taking: Reported on 05/26/2022 11/09/21   Barrett Henle, MD  Zoster Vaccine Adjuvanted [facility]) injection Inject 0.5 mLs into the muscle once, repeat in 2 months 05/26/22   Lind Covert, MD    Family History Family History  Problem Relation Age of Onset   Dementia Mother    Heart disease Father    Diabetes Father    Other Sister        BRIP1 gene mutation   Kidney disease Brother    Colon cancer Maternal Uncle    Lung cancer Paternal Aunt        d. >50   Cancer Paternal Aunt        unknown type, d. >50   Cancer Cousin 73       gynecologic (paternal first cousin)   Colon cancer Nephew 23       arising in colon polyp   Cancer Niece 8       gynecologic; niece in her 61's   Breast cancer Niece 64   Other Niece        BRIP1 gene mutation   Cancer Paternal Great-grandmother        abdominal cancer (PGF's mother) great grandmother    Social History Social History   Tobacco Use   Smoking status: Never   Smokeless tobacco: Never  Substance Use Topics   Alcohol use: No   Drug use: No     Allergies   Patient has no known allergies.   Review of Systems Review of Systems  Constitutional:  Negative for chills and fever.  Eyes:  Negative for discharge and redness.  Respiratory:  Negative for shortness of breath.   Cardiovascular:  Negative for chest pain.  Gastrointestinal:  Positive for nausea. Negative for vomiting.  Neurological:  Positive for dizziness, light-headedness and headaches. Negative for numbness.     Physical Exam Triage Vital Signs ED Triage  Vitals  Enc Vitals Group     BP 06/10/22 1200 132/76     Pulse Rate 06/10/22 1200 84     Resp 06/10/22 1200 16     Temp 06/10/22 1200 97.6 F (36.4 C)     Temp Source 06/10/22 1200 Oral     SpO2 06/10/22 1200 96 %     Weight --  Height --      Head Circumference --      Peak Flow --      Pain Score 06/10/22 1159 1     Pain Loc --      Pain Edu? --      Excl. in [place]? --    No data found.  Updated Vital Signs BP 132/76 (BP Location: Left Arm)   Pulse 84   Temp 97.6 F (36.4 C) (Oral)   Resp 16   SpO2 96%   Visual Acuity Right Eye Distance:   Left Eye Distance:   Bilateral Distance:    Right Eye Near:   Left Eye Near:    Bilateral Near:     Physical Exam Vitals and nursing note reviewed.  Constitutional:      Appearance: Normal appearance.  HENT:     Head: Normocephalic and atraumatic.  Eyes:     Extraocular Movements: Extraocular movements intact.     Conjunctiva/sclera: Conjunctivae normal.     Pupils: Pupils are equal, round, and reactive to light.  Cardiovascular:     Rate and Rhythm: Normal rate. Rhythm irregular.     Comments: Irregularly irregular rhythm Pulmonary:     Effort: Pulmonary effort is normal. No respiratory distress.     Breath sounds: Normal breath sounds. No wheezing, rhonchi or rales.  Neurological:     Mental Status: He is alert and oriented to person, place, and time.     Cranial Nerves: Facial asymmetry (questionable minimal right sided facial droop of eyelid) present.     Coordination: Coordination normal.     Comments: Patient seems to have mild slurring of speech however reports his speech is normal      UC Treatments / Results  Labs (all labs ordered are listed, but only abnormal results are displayed) Labs Reviewed - No data to display  EKG   Radiology No results found.  Procedures Procedures (including critical care time)  Medications Ordered in UC Medications - No data to display  Initial Impression /  Assessment and Plan / UC Course  I have reviewed the triage vital signs and the nursing notes.  Pertinent labs & imaging results that were available during my care of the patient were reviewed by me and considered in my medical decision making (see chart for details).    Given symptoms and risk factors recommended further evaluation in the emergency department.  Patient drove to urgent care by himself, he has coworker pick him up and take him via POV to emergency department for further evaluation.  Final Clinical Impressions(s) / UC Diagnoses   Final diagnoses:  Lightheadedness  Longstanding persistent atrial fibrillation (HCC)  Acute nonintractable headache, unspecified headache type   Discharge Instructions   None    ED Prescriptions   None    PDMP not reviewed this encounter.   Francene Finders, PA-C 06/10/22 1240

## 2022-06-10 NOTE — ED Notes (Signed)
Pt to RM 38 at this time via wheelchair by EMT Maisie Fus. Upon arrival to treatment area, pt ambulated to BR, steady gait noted.

## 2022-06-10 NOTE — ED Triage Notes (Signed)
The patient states when he went to the mailbox today he began to fee like his blood pressure dropped, he states he felt lightheaded and dizzy. He states he does have a-fib. The patient states he does not have chest pain but does have a slight headache.

## 2022-06-10 NOTE — ED Provider Notes (Signed)
[facility] EMERGENCY DEPARTMENT Provider Note   CSN: [id number] Arrival date & time: 06/10/22  1254     History {Add pertinent medical, surgical, social history, OB history to HPI:1} Chief Complaint  Patient presents with   Near Syncope    Wayne Molina is a 66 y.o. male.  66 year old male with a history of atrial fibrillation who presented to the emergency department with presyncope.  Patient states that he worked out today and was [employer].  He got home and was sitting down and heard the mail truck so he went outside to get the mail.  He said while he was walking he started to feel lightheaded as though he was going to pass out.  He went inside and sat down but was persistently dizzy.  He took his blood pressure and heart rate which were both normal (heart rate in the 70s).  Says that this persisted for approximately 2 hours but has improved since then.  He did go to urgent care and was evaluated and was referred to the emergency department for additional evaluation.  Of note, they thought that they saw facial droop which she states is normal for him.  In triage, he was reported to have chest pain and shortness of breath during this event but he denies this to me.  No history of MI, DVT, recent illness.  No melena or abdominal pain.  Did report a very mild headache that was gradual in onset and not severe during this episode.   Near Syncope       Home Medications Prior to Admission medications   Medication Sig Start Date End Date Taking? Authorizing Provider  alendronate (FOSAMAX) 70 MG tablet Take 1 tablet (70 mg total) by mouth once a week. Take with a full glass of water on an empty stomach. 06/02/22   Carney Living, MD  amLODipine (NORVASC) 2.5 MG tablet Take 1 tablet (2.5 mg total) by mouth daily. 05/26/22   Carney Living, MD  aspirin 81 MG tablet Take 81 mg by mouth daily.    [provider]  atorvastatin (LIPITOR) 40 MG tablet  Take 1 tablet (40 mg total) by mouth daily. 06/02/22   Carney Living, MD  calcium carbonate (OSCAL) 1500 (600 Ca) MG TABS tablet Take by mouth 2 (two) times daily with a meal.    [provider]  ibuprofen (ADVIL,MOTRIN) 800 MG tablet Take 800 mg by mouth every 8 (eight) hours as needed for mild pain or moderate pain.    [provider]  losartan (COZAAR) 100 MG tablet Take 1 tablet (100 mg total) by mouth daily. 05/26/22   Carney Living, MD  Magnesium 250 MG TABS Take 1 tablet by mouth daily.    [provider]  MULTIPLE VITAMIN PO Take 1 tablet by mouth daily.    [provider]  sildenafil (VIAGRA) 50 MG tablet Take 2 tablets (100 mg total) by mouth daily as needed. 05/26/22   Carney Living, MD  tiZANidine (ZANAFLEX) 4 MG tablet Take 1 tablet (4 mg total) by mouth every 8 (eight) hours as needed for muscle spasms. Patient not taking: Reported on 05/26/2022 11/09/21   Zenia Resides, MD  Zoster Vaccine Adjuvanted [facility]) injection Inject 0.5 mLs into the muscle once, repeat in 2 months 05/26/22   Carney Living, MD      Allergies    Patient has no known allergies.    Review of Systems  Review of Systems  Cardiovascular:  Positive for near-syncope.    Physical Exam Updated Vital Signs BP (!) 152/104   Pulse 64   Temp 97.8 F (36.6 C) (Oral)   Resp 19   Ht 5\' 9"  (1.753 m)   Wt 73.9 kg   SpO2 100%   BMI 24.07 kg/m  Physical Exam  ED Results / Procedures / Treatments   Labs (all labs ordered are listed, but only abnormal results are displayed) Labs Reviewed  BASIC METABOLIC PANEL - Abnormal; Notable for the following components:      Result Value   Glucose, Bld 101 (*)    All other components within normal limits  URINALYSIS, ROUTINE W REFLEX MICROSCOPIC - Abnormal; Notable for the following components:   Ketones, ur 20 (*)    Protein, ur 30 (*)    All other components within normal limits  CBG MONITORING,  ED - Abnormal; Notable for the following components:   Glucose-Capillary 101 (*)    All other components within normal limits  TROPONIN I (HIGH SENSITIVITY) - Abnormal; Notable for the following components:   Troponin I (High Sensitivity) 22 (*)    All other components within normal limits  TROPONIN I (HIGH SENSITIVITY) - Abnormal; Notable for the following components:   Troponin I (High Sensitivity) 29 (*)    All other components within normal limits  CBC  D-DIMER, QUANTITATIVE    EKG None  Radiology DG Chest 2 View  Result Date: 06/10/2022 CLINICAL DATA:  Weakness, near syncope. EXAM: CHEST - 2 VIEW COMPARISON:  09/03/2012. FINDINGS: Trachea is midline. Heart size stable. Loop recorder projects over the left heart border. Thoracic aorta is calcified. Lungs are clear. No pleural fluid. IMPRESSION: No acute findings. Electronically Signed   By: 09/05/2012 M.D.   On: 06/10/2022 14:46    Procedures Procedures  {Document cardiac monitor, telemetry assessment procedure when appropriate:1}  Medications Ordered in ED Medications  lactated ringers bolus 1,000 mL (1,000 mLs Intravenous New Bag/Given 06/10/22 2140)    ED Course/ Medical Decision Making/ A&P                           Medical Decision Making Amount and/or Complexity of Data Reviewed Labs: ordered.  Risk Decision regarding hospitalization.   ***  {Document critical care time when appropriate:1} {Document review of labs and clinical decision tools ie heart score, Chads2Vasc2 etc:1}  {Document your independent review of radiology images, and any outside records:1} {Document your discussion with family members, caretakers, and with consultants:1} {Document social determinants of health affecting pt's care:1} {Document your decision making why or why not admission, treatments were needed:1} Final Clinical Impression(s) / ED Diagnoses Final diagnoses:  Near syncope    Rx / DC Orders ED Discharge Orders      None

## 2022-06-10 NOTE — ED Triage Notes (Signed)
Patient complains of a near syncope while walking to his mailbox earlier today. Patient continues to complain of generalized weakness and lightheadedness, reports history of persistent afib for the last ten years. Patient is alert, oriented, and in no apparent distress at this time.

## 2022-06-10 NOTE — ED Provider Triage Note (Signed)
Emergency Medicine Provider Triage Evaluation Note  Wayne Molina , a 66 y.o. male  was evaluated in triage.  Pt complains of SOB, chest pain, and lightheadedness after walking to the mailbox today. The patient reprots that he is in consistent afiba nd is NOT on a blood thinner. The patietn reports that he was having a headache, nausea, and diaphoresis as well. Reports some upper back pain. .  Review of Systems  Positive:  Negative:   Physical Exam  BP 131/82 (BP Location: Left Arm)   Pulse 67   Temp 97.6 F (36.4 C)   Resp 16   SpO2 100%  Gen:   Awake, no distress   Resp:  Normal effort  MSK:   Moves extremities without difficulty  Other:  Cranial nerves gorssly intact. Patient answering questions appropriately with appropriate speech. Moving all extremities. Sensation intact.   Medical Decision Making  Medically screening exam initiated at 1:33 PM.  Appropriate orders placed.  Wayne Molina was informed that the remainder of the evaluation will be completed by another provider, this initial triage assessment does not replace that evaluation, and the importance of remaining in the ED until their evaluation is complete.  Likely cardiac. Ordering labs.    Achille Rich, [place] 06/10/22 1337

## 2022-06-11 ENCOUNTER — Telehealth (HOSPITAL_COMMUNITY): Payer: Self-pay | Admitting: Pharmacy Technician

## 2022-06-11 ENCOUNTER — Observation Stay (HOSPITAL_COMMUNITY): Payer: No Typology Code available for payment source

## 2022-06-11 ENCOUNTER — Other Ambulatory Visit (HOSPITAL_COMMUNITY): Payer: Self-pay

## 2022-06-11 ENCOUNTER — Observation Stay (HOSPITAL_BASED_OUTPATIENT_CLINIC_OR_DEPARTMENT_OTHER): Payer: No Typology Code available for payment source

## 2022-06-11 ENCOUNTER — Encounter (HOSPITAL_COMMUNITY): Payer: Self-pay | Admitting: Internal Medicine

## 2022-06-11 ENCOUNTER — Other Ambulatory Visit: Payer: Self-pay

## 2022-06-11 DIAGNOSIS — R42 Dizziness and giddiness: Secondary | ICD-10-CM

## 2022-06-11 DIAGNOSIS — R55 Syncope and collapse: Secondary | ICD-10-CM

## 2022-06-11 LAB — ECHOCARDIOGRAM COMPLETE
Height: 69 in
P 1/2 time: 585 msec
S' Lateral: 2.7 cm
Weight: 2608 oz

## 2022-06-11 LAB — COMPREHENSIVE METABOLIC PANEL
ALT: 24 U/L (ref 0–44)
AST: 27 U/L (ref 15–41)
Albumin: 3.7 g/dL (ref 3.5–5.0)
Alkaline Phosphatase: 45 U/L (ref 38–126)
Anion gap: 8 (ref 5–15)
BUN: 19 mg/dL (ref 8–23)
CO2: 27 mmol/L (ref 22–32)
Calcium: 9.3 mg/dL (ref 8.9–10.3)
Chloride: 106 mmol/L (ref 98–111)
Creatinine, Ser: 0.93 mg/dL (ref 0.61–1.24)
GFR, Estimated: >60 mL/min (ref >60)
Glucose, Bld: 80 mg/dL (ref 70–99)
Potassium: 3.9 mmol/L (ref 3.5–5.1)
Sodium: 141 mmol/L (ref 135–145)
Total Bilirubin: 2.7 mg/dL — ABNORMAL HIGH (ref 0.3–1.2)
Total Protein: 6.5 g/dL (ref 6.5–8.1)

## 2022-06-11 LAB — RAPID URINE DRUG SCREEN, HOSP PERFORMED
Amphetamines: NOT DETECTED
Barbiturates: NOT DETECTED
Benzodiazepines: NOT DETECTED
Cocaine: NOT DETECTED
Opiates: NOT DETECTED
Tetrahydrocannabinol: NOT DETECTED

## 2022-06-11 LAB — HIV ANTIBODY (ROUTINE TESTING W REFLEX): HIV Screen 4th Generation wRfx: NONREACTIVE

## 2022-06-11 LAB — CBC
HCT: 44.5 % (ref 39.0–52.0)
HCT: 50.5 % (ref 39.0–52.0)
Hemoglobin: 15.3 g/dL (ref 13.0–17.0)
Hemoglobin: 17.1 g/dL — ABNORMAL HIGH (ref 13.0–17.0)
MCH: 30.6 pg (ref 26.0–34.0)
MCH: 31 pg (ref 26.0–34.0)
MCHC: 33.9 g/dL (ref 30.0–36.0)
MCHC: 34.4 g/dL (ref 30.0–36.0)
MCV: 90.3 fL (ref 80.0–100.0)
MCV: 90.5 fL (ref 80.0–100.0)
Platelets: 235 10*3/uL (ref 150–400)
Platelets: 261 10*3/uL (ref 150–400)
RBC: 4.93 MIL/uL (ref 4.22–5.81)
RBC: 5.58 MIL/uL (ref 4.22–5.81)
RDW: 12.6 % (ref 11.5–15.5)
RDW: 12.8 % (ref 11.5–15.5)
WBC: 5.8 10*3/uL (ref 4.0–10.5)
WBC: 6.8 10*3/uL (ref 4.0–10.5)
nRBC: 0 % (ref 0.0–0.2)
nRBC: 0 % (ref 0.0–0.2)

## 2022-06-11 LAB — CREATININE, SERUM
Creatinine, Ser: 0.87 mg/dL (ref 0.61–1.24)
GFR, Estimated: >60 mL/min (ref >60)

## 2022-06-11 LAB — GLUCOSE, CAPILLARY: Glucose-Capillary: 89 mg/dL (ref 70–99)

## 2022-06-11 LAB — ETHANOL: Alcohol, Ethyl (B): <10 mg/dL (ref <10)

## 2022-06-11 LAB — TSH: TSH: 2.199 u[IU]/mL (ref 0.350–4.500)

## 2022-06-11 MED ORDER — APIXABAN 5 MG PO TABS
5.0000 mg | ORAL_TABLET | Freq: Two times a day (BID) | ORAL | Status: DC
  Administered 2022-06-11 – 2022-06-12 (×3): 5 mg via ORAL
  Filled 2022-06-11 (×3): qty 1

## 2022-06-11 MED ORDER — AMLODIPINE BESYLATE 2.5 MG PO TABS
2.5000 mg | ORAL_TABLET | Freq: Every day | ORAL | Status: DC
  Administered 2022-06-11 – 2022-06-12 (×2): 2.5 mg via ORAL
  Filled 2022-06-11 (×2): qty 1

## 2022-06-11 MED ORDER — LOSARTAN POTASSIUM 50 MG PO TABS
100.0000 mg | ORAL_TABLET | Freq: Every day | ORAL | Status: DC
  Administered 2022-06-11 – 2022-06-12 (×2): 100 mg via ORAL
  Filled 2022-06-11 (×2): qty 2

## 2022-06-11 MED ORDER — PERFLUTREN LIPID MICROSPHERE
1.0000 mL | INTRAVENOUS | Status: AC | PRN
  Administered 2022-06-11: 3 mL via INTRAVENOUS

## 2022-06-11 MED ORDER — HYDRALAZINE HCL 20 MG/ML IJ SOLN: 5.0000 mg | Freq: Once | INTRAMUSCULAR | Status: DC

## 2022-06-11 MED ORDER — LORAZEPAM 2 MG/ML IJ SOLN
0.2500 mg | Freq: Once | INTRAMUSCULAR | Status: AC
Start: 2022-06-11 — End: 2022-06-11
  Administered 2022-06-11: 0.25 mg via INTRAVENOUS
  Filled 2022-06-11: qty 1

## 2022-06-11 NOTE — Consult Note (Addendum)
Cardiology Consultation:   Patient ID: TOLUWANI YADAV MRN: [id number]; DOB: March 06, 1956  Admit date: 06/10/2022 Date of Consult: 06/11/2022  PCP:  Lind Covert, MD   [facility] Cardiologist:  Dr Sallyanne Kuster    Patient Profile:   Wayne Molina is a 66 y.o. male with a hx of standing permanent atrial fibrillation with spontaneously controlled ventricular response, syncope 2017, OSA not using CPAP, depression, HTN, HLD, who is being seen 06/11/2022 for the evaluation of syncope and A fib at the request of Dr Tawanna Solo.  History of Present Illness:   Wayne Molina with above PMH presented to ER with c/o syncope.   He follows Dr Sallyanne Kuster outpatient, has long-standing permanent atrial fibrillation with spontaneously controlled ventricular response. CHA2DS2-VASc score was 2 (age and hypertension) but he only wishes taking aspirin and refused anticoagulation in the past.   He had single episode of syncope 2017. A loop recorder was implanted in April 2017 and has not shown an abnormality that could be clearly associated with syncope. He has a relatively frequent episodes of 3-4 second pauses during atrial fibrillation, but these are consistently nocturnal and always asymptomatic; longer pauses have not been seen.  His loop recorder showed appropriate heart rate increase with physical exercise.  His device reached RRT a year ago10/09/2019.  He was last seen in the office 09/19/20, doing well without cardiovascular complaints. He continued refusing anticoagulation for CVA prophylaxis.  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 He had no CHF symptoms. He was maintained on losartan and amlodipine for HTN.   He states he has been working out a lot over the past few month to lose weight, because he had poor diet choice during COVID-19 pandemic years. He had exercised yesterday and upon returning to home, he was feeling lightheaded. He recalls that he almost passed out.  He states symptoms lasted about 10 minutes. He checked his BP and HR which both were in the normal ranges. He states at one point he sat down on the recliner and felt very warm and nauseated. He denied having any chest pain, SOB, vomiting. He states he did not do vigorous exercises and maintained PO hydration yesterday.    He felt over the past 1 years he has been having intermittent SOB while sleeping at night, laying flat, or when sitting too close to someone. He reports needing to sit up in chair to help relieving SOB. He also noted increased SOB and heart palpitation episodes after having intercourse with his girlfriend, felt taking Sildenafil helping resolving these symptoms. He denied any recent rapid weight gain, leg edema. He denied tobacco use, ETOH use, or illicit drug use. He is not using CPAP for OSA, because he can't tolerate the mask.   Diagnostic here showed unremarkable CMP and CBC. Hs trop 22>29. UA + protein and ketones. UDS negative. ETOH negative. TSH WNL. CXR without acute finding. EKG showed A fib, ventricular rate of 75bpm, old inferolateral TWI. He was admitted to hospital medicine service, reportedly had systolic blood pressure falling from 167 to 146 mmHg while standing for 3 minutes. Cardiology is consulted for near syncope and A fib.     Past Medical History:  Diagnosis Date   Anxiety    Arthritis    fingers   Chronic atrial fibrillation (HCC)    Depression    Family history of breast cancer    Family history of cancer of male genital organ    Family history of gene mutation  BRIP1   Family history of malignant neoplasm of gastrointestinal tract    Hyperlipidemia    Hypertension    Personal history of colonic polyps 11/12/2006   Sleep apnea     Past Surgical History:  Procedure Laterality Date   2-D echocardiogram  07/22/2010   Ejection fraction greater than 55%. Left atrium moderately dilated. Right atrium moderately dilated. 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  Mild to Moderate MR. Mild to moderate TR.   ATRIAL ABLATION SURGERY  2004   EP IMPLANTABLE DEVICE N/A 02/11/2016   Procedure: Loop Recorder Insertion;  Surgeon: Sanda Klein, MD;  Location: [facility];  Service: Cardiovascular;  Laterality: N/A;   Exercise Myoview stress test  07/08/2000   Nonischemic low-risk.   HIP FRACTURE SURGERY  1970s   IR GENERIC HISTORICAL  01/21/2017   IR RADIOLOGIST EVAL & MGMT 01/21/2017 [facility]     Home Medications:  Prior to Admission medications   Medication Sig Start Date End Date Taking? Authorizing Provider  acetaminophen (TYLENOL) 500 MG tablet Take 500 mg by mouth daily as needed (muscle pain).   Yes [provider]  alendronate (FOSAMAX) 70 MG tablet Take 1 tablet (70 mg total) by mouth once a week. Take with a full glass of water on an empty stomach. 06/02/22  Yes Chambliss, Jeb Levering, MD  amLODipine (NORVASC) 2.5 MG tablet Take 1 tablet (2.5 mg total) by mouth daily. 05/26/22  Yes Lind Covert, MD  aspirin 81 MG tablet Take 162 mg by mouth daily.   Yes [provider]  atorvastatin (LIPITOR) 40 MG tablet Take 1 tablet (40 mg total) by mouth daily. 06/02/22  Yes Chambliss, Jeb Levering, MD  calcium carbonate (OSCAL) 1500 (600 Ca) MG TABS tablet Take by mouth 2 (two) times daily with a meal.   Yes [provider]  Cyanocobalamin (VITAMIN B-12 PO) Take 1 capsule by mouth daily.   Yes [provider]  ibuprofen (ADVIL,MOTRIN) 800 MG tablet Take 800 mg by mouth daily as needed for mild pain or moderate pain.   Yes [provider]  losartan (COZAAR) 100 MG tablet Take 1 tablet (100 mg total) by mouth daily. Patient taking differently: Take 100 mg by mouth at bedtime. 05/26/22  Yes Chambliss, Jeb Levering, MD  Magnesium 250 MG TABS Take 1 tablet by mouth daily.   Yes [provider]  MULTIPLE VITAMIN PO Take 1 tablet by mouth daily.   Yes [provider]  POTASSIUM PO Take 1 tablet  by mouth daily.   Yes [provider]  sildenafil (VIAGRA) 50 MG tablet Take 2 tablets (100 mg total) by mouth daily as needed. Patient taking differently: Take 100 mg by mouth daily as needed for erectile dysfunction (prevent palpitations). 05/26/22  Yes Lind Covert, MD  Zoster Vaccine Adjuvanted [facility]) injection Inject 0.5 mLs into the muscle once, repeat in 2 months 05/26/22   Lind Covert, MD    Inpatient Medications: Scheduled Meds:  heparin  5,000 Units Subcutaneous Q8H   hydrALAZINE  5 mg Intravenous Once   sodium chloride flush  3 mL Intravenous Q12H   Continuous Infusions:  sodium chloride 75 mL/hr at 06/11/22 1202   PRN Meds: acetaminophen **OR** acetaminophen, ondansetron **OR** ondansetron (ZOFRAN) IV  Allergies:   No Known Allergies  Social History:   Social History   Socioeconomic History   Marital status: Divorced    Spouse name: Not on file   Number of children: 3   Years  of education: 12   Highest education level: GED or equivalent  Occupational History   Not on file  Tobacco Use   Smoking status: Never   Smokeless tobacco: Never  Substance and Sexual Activity   Alcohol use: No   Drug use: No   Sexual activity: Yes  Other Topics Concern   Not on file  Social History Narrative   Patient lives alone in [place] in a one story home. There are no steps.    Patient enjoys going to the gym, watching TV, and going to antique shops.    Patient is divorced with 3 children, two of which have passed; does have a girlfriend.    Patient has his own car and is able to drive.    Social Determinants of Health   Financial Resource Strain: Low Risk  (05/28/2020)   Overall Financial Resource Strain (CARDIA)    Difficulty of Paying Living Expenses: Not hard at all  Food Insecurity: No Food Insecurity (05/28/2020)   Hunger Vital Sign    Worried About Running Out of Food in the Last Year: Never true    Ran Out of Food in the Last Year:  Never true  Transportation Needs: No Transportation Needs (05/28/2020)   PRAPARE - Hydrologist (Medical): No    Lack of Transportation (Non-Medical): No  Physical Activity: Sufficiently Active (05/28/2020)   Exercise Vital Sign    Days of Exercise per Week: 4 days    Minutes of Exercise per Session: 120 min  Stress: No Stress Concern Present (05/28/2020)   [place]    Feeling of Stress : Only a little  Social Connections: Socially Isolated (05/28/2020)   Social Connection and Isolation Panel [NHANES]    Frequency of Communication with Friends and Family: More than three times a week    Frequency of Social Gatherings with Friends and Family: More than three times a week    Attends Religious Services: Never    Marine scientist or Organizations: No    Attends Archivist Meetings: Never    Marital Status: Divorced  Human resources officer Violence: Not At Risk (05/28/2020)   Humiliation, Afraid, Rape, and Kick questionnaire    Fear of Current or Ex-Partner: No    Emotionally Abused: No    Physically Abused: No    Sexually Abused: No    Family History:    Family History  Problem Relation Age of Onset   Dementia Mother    Heart disease Father    Diabetes Father    Other Sister        BRIP1 gene mutation   Kidney disease Brother    Colon cancer Maternal Uncle    Lung cancer Paternal Aunt        d. >50   Cancer Paternal Aunt        unknown type, d. >50   Cancer Cousin 18       gynecologic (paternal first cousin)   Colon cancer Nephew 35       arising in colon polyp   Cancer Niece 27       gynecologic; niece in her 3's   Breast cancer Niece 21   Other Niece        BRIP1 gene mutation   Cancer Paternal Great-grandmother        abdominal cancer (PGF's mother) great grandmother     ROS:  Constitutional: Denied fever, chills, malaise, night  sweats Eyes: Denied vision  change or loss Ears/Nose/Mouth/Throat: Denied ear ache, sore throat, coughing, sinus pain Cardiovascular: see HPI  Respiratory: see HPI  Gastrointestinal: Denied nausea, vomiting, abdominal pain, diarrhea Genital/Urinary: Denied dysuria, hematuria, urinary frequency/urgency Musculoskeletal: Denied muscle ache, joint pain, weakness Skin: Denied rash, wound Neuro: see HPI  Psych: Denied history of depression/anxiety  Endocrine: Denied history of diabetes   Physical Exam/Data:   Vitals:   06/11/22 0306 06/11/22 0420 06/11/22 0843 06/11/22 1241  BP: (!) 153/93 (!) 140/73 134/78 (!) 154/89  Pulse: 64 65 73 75  Resp:  '16 16 16  ' Temp:  (!) 97.4 F (36.3 C) 97.9 F (36.6 C) 97.9 F (36.6 C)  TempSrc:  Oral Oral Oral  SpO2:  100% 100% 100%  Weight:      Height:        Intake/Output Summary (Last 24 hours) at 06/11/2022 1344 Last data filed at 06/11/2022 0500 Gross per 24 hour  Intake 1194.22 ml  Output --  Net 1194.22 ml      06/10/2022    9:39 PM 05/26/2022    9:04 AM 02/20/2022    8:40 AM  Last 3 Weights  Weight (lbs) 163 lb 163 lb 12.8 oz 164 lb  Weight (kg) 73.936 kg 74.299 kg 74.39 kg     Body mass index is 24.07 kg/m.   Vitals:  Vitals:   06/11/22 0843 06/11/22 1241  BP: 134/78 (!) 154/89  Pulse: 73 75  Resp: 16 16  Temp: 97.9 F (36.6 C) 97.9 F (36.6 C)  SpO2: 100% 100%   General Appearance: In no apparent distress, eating lunch, sitting  HEENT: Normocephalic, atraumatic. Neck: Supple, trachea midline, no JVDs Cardiovascular: Irregularly irregular, normal S1-S2, no murmur  Respiratory: Resting breathing unlabored, lungs sounds clear to auscultation bilaterally, no use of accessory muscles. On room air.  No wheezes, rales or rhonchi.   Gastrointestinal: abdomen soft, non-tender  Extremities: Able to move all extremities in bed without difficulty, no edema Musculoskeletal: Normal muscle bulk and tone Skin: Intact, warm, dry. No rashes or petechiae noted in  exposed areas.  Neurologic: Alert, oriented to person, place and time. No cognitive deficit Psychiatric: Normal affect. Mood is appropriate.    EKG:  The EKG was personally reviewed and demonstrates:  A fib, 75 bpm, LVH, old inferolateral TWI  Telemetry:  Telemetry was personally reviewed and demonstrates:  A fib, ventricular rate of 48-70s, occasional PACs  Relevant CV Studies:  Echo pending   Laboratory Data:  High Sensitivity Troponin:   Recent Labs  Lab 06/10/22 1339 06/10/22 1825  TROPONINIHS 22* 29*     Chemistry Recent Labs  Lab 06/10/22 1339 06/11/22 0024 06/11/22 0630  NA 136  --  141  K 4.0  --  3.9  CL 104  --  106  CO2 26  --  27  GLUCOSE 101*  --  80  BUN 22  --  19  CREATININE 1.02 0.87 0.93  CALCIUM 9.9  --  9.3  GFRNONAA >60 >60 >60  ANIONGAP 6  --  8    Recent Labs  Lab 06/11/22 0630  PROT 6.5  ALBUMIN 3.7  AST 27  ALT 24  ALKPHOS 45  BILITOT 2.7*   Lipids No results for input(s): "CHOL", "TRIG", "HDL", "LABVLDL", "LDLCALC", "CHOLHDL" in the last 168 hours.  Hematology Recent Labs  Lab 06/10/22 1339 06/11/22 0024 06/11/22 0630  WBC 8.8 6.8 5.8  RBC 5.05 5.58 4.93  HGB 15.6 17.1* 15.3  HCT 46.2 50.5 44.5  MCV 91.5 90.5 90.3  MCH 30.9 30.6 31.0  MCHC 33.8 33.9 34.4  RDW 12.7 12.8 12.6  PLT 235 261 235   Thyroid  Recent Labs  Lab 06/11/22 0024  TSH 2.199    BNPNo results for input(s): "BNP", "PROBNP" in the last 168 hours.  DDimer  Recent Labs  Lab 06/10/22 1339  DDIMER 0.27     Radiology/Studies:  DG Chest 2 View  Result Date: 06/10/2022 CLINICAL DATA:  Weakness, near syncope. EXAM: CHEST - 2 VIEW COMPARISON:  09/03/2012. FINDINGS: Trachea is midline. Heart size stable. Loop recorder projects over the left heart border. Thoracic aorta is calcified. Lungs are clear. No pleural fluid. IMPRESSION: No acute findings. Electronically Signed   By: Lorin Picket M.D.   On: 06/10/2022 14:46     Assessment and Plan:   Near  syncope - maybe due to ??orthostasis  - will follow Echo  - Loop record in the past showed no abnormalities  - would repeat event monitor   Elevated troponin  - Hs trop 22>29 - no chest pain, had lightheadedness and near syncope episode  - EKG without acute changes - consider ischemic evaluation given his presentation can be angina equivalent symptoms   Dyspnea on exertion  Orthopnea Apical Hypertrophic Cardiomyopathy  - EKG and Echo in the past with evidence of apical HCM historically, without CHF symptoms  - Now reports 1 year onset of DOE with intercourse and SOB when laying flat or sitting close to people  - clinically euvolemic at this time  - will follow Echo before further recommendation - encouraged CPAP use for OSA, patient refused   Permeant A fib with slowed ventricular response  - HR down to 48s, no pauses >3 seconds - appears within his baseline  - does not wish anticoagulation in the past, felt easily bleeding and bruise, but willing to try now as it has been recommended, would start Eliquis 30m BID  - would repeat event monitor now   HTN  - BP elevated up to 188/104 today - on losartan and amlodipine at home, both are not ordered here, would resume    Risk Assessment/Risk Scores:    CHA2DS2-VASc Score = 2  his indicates a 2.2% annual risk of stroke. The patient's score is based upon: CHF History: 0 HTN History: 1 Diabetes History: 0 Stroke History: 0 Vascular Disease History: 0 Age Score: 1 Gender Score: 0     For questions or updates, please contact CBrookerPlease consult [email or web address] for contact info under    Signed, XMargie Billet NP  06/11/2022 1:44 PM  Patient seen and examined with XMargie BilletNP.  Agree as above, with the following exceptions and changes as noted below. 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 Gen: NAD, CV: iRRR, no murmurs, Lungs: clear, Abd: soft, Extrem: Warm, well perfused, no edema, Neuro/Psych: alert and oriented x 3, normal mood and affect. All available labs, radiology testing, previous records reviewed. Noted to be in permanent atrial fibrillation but given the physical nature of his work he has declined anticoagulation historically. However I have independently reviewed his echo images at the bedside as well as his last echocardiogram. Findings consistent with apical hypertrophic cardiomyopathy. No evidence of apical pouch on echo today, which is reassuring since apical pouch can  be arrhythmogenic.   Interestingly, his DOE is relieved with use of PDE5i in the form of Viagra used for sexual dysfunction. No evidence of pulmonary hypertension on echo. No evidence of ACS on presentation.   Symptoms have an unclear etiology at this time given no obstruction on echo to cause syncope or DOE. Monitor on telemetry overnight. We may need a repeat monitor and possibly a stress evaluation though this may be best completed as an outpatient. Could consider stress MRI or cardiac PET.   Will review tele from overnight, cards to follow.   Elouise Munroe, MD

## 2022-06-11 NOTE — Progress Notes (Signed)
Brief same-day note  Patient is a 66 year old male with history of hypertension, untreated OSA, osteoarthritis, paroxysmal atrial fibrillation on aspirin, syncope who presented with complaints of near syncopal with persistent feeling of lightheaded at home when he walked to the mailbox.  He has history of syncope and had a loop recorder placed by cardiology in the past.  No history of chest pain or palpitation.  On presentation his symptoms completely resolved.  He recently was started on amlodipine for his hypertension.  On presentation he was hemodynamically stable, EKG showed A-fib with controlled rate.  Patient was admitted for the management of syncope .  Cardiology also consulted today.  Assessment and plan  Near syncope: Presented with lightheadedness, had near syncopal event when he went to the mailbox at home.  Asymptomatic on presentation.  Blood pressure stable.  Has history of syncope and was placed loop recorder. Echo has been ordered.  Orthostatic vitals checked on presentation seems to be positive with systolic blood pressure falling from 167 to 146 mmHg while standing for 3 minutes.  Continue gentle IV fluids Cardiology  consulted. We will also consult PT evaluation  Paroxysmal A-fib: Only on aspirin.CHA2DS2-VASc score of  2.  Rate is controlled.  Monitor on telemetry  Hypertension: Hypertensive on presentation.  on losartan, amlod at home  OSA: Intolerant of CPAP.  History of osteoporosis: Status post vertebral fixation.  On Fosamax

## 2022-06-11 NOTE — Progress Notes (Signed)
  Echocardiogram 2D Echocardiogram has been performed.  Delcie Roch 06/11/2022, 2:38 PM

## 2022-06-11 NOTE — ED Notes (Signed)
Pt has a ready inpatient bed, waiting on RN to be assigned to pt for inpatient admission 6N10C

## 2022-06-11 NOTE — ED Notes (Signed)
Secure message sent to receiving RN laura delapp, awaiting response. Pt has an inpatient bed at this time

## 2022-06-11 NOTE — Evaluation (Signed)
Physical Therapy Evaluation Patient Details Name: Wayne Molina MRN: [id number] DOB: May 20, 1956 Today's Date: 06/11/2022  History of Present Illness  Wayne Molina is a 66 y.o. male with a hx of standing permanent atrial fibrillation with spontaneously controlled ventricular response, syncope 2017, OSA not using CPAP, depression, HTN, HLD, admitted due to pre-syncope and A fib.  Clinical Impression  Patient presents with mobility close to baseline.  States has normal stiffness after bedrest and seemed to compensate with wide based gait.  No follow up PT needs at this time.  Orthostatics taken as noted below and pt denies symptoms.  PT to sign off.   Orthostatic VS for the past 24 hrs (Last 3 readings):  BP- Lying Pulse- Lying BP- Sitting Pulse- Sitting BP- Standing at 0 minutes Pulse- Standing at 0 minutes BP- Standing at 3 minutes Pulse- Standing at 3 minutes  06/11/22 1500 (!) 156/103 79 141/83 70 (!) 155/91 84 (!) 150/93 83          Recommendations for follow up therapy are one component of a multi-disciplinary discharge planning process, led by the attending physician.  Recommendations may be updated based on patient status, additional functional criteria and insurance authorization.  Follow Up Recommendations No PT follow up      Assistance Recommended at Discharge PRN  Patient can return home with the following  Other (comment) (no assistance needed at d/c.)    Equipment Recommendations None recommended by PT  Recommendations for Other Services       Functional Status Assessment Patient has not had a recent decline in their functional status     Precautions / Restrictions Precautions Precautions: None      Mobility  Bed Mobility Overal bed mobility: Independent                  Transfers Overall transfer level: Independent                      Ambulation/Gait Ambulation/Gait assistance: Independent     Gait Pattern/deviations: WFL(Within  Functional Limits), Wide base of support       General Gait Details: wide based gait, pt reports normal for him after being in bed  Stairs            Wheelchair Mobility    Modified Rankin (Stroke Patients Only)       Balance Overall balance assessment: No apparent balance deficits (not formally assessed)                                           Pertinent Vitals/Pain Pain Assessment Pain Assessment: No/denies pain    Home Living Family/patient expects to be discharged to:: Private residence Living Arrangements: Alone   Type of Home: House Home Access: Level entry       Home Layout: One level Home Equipment: None      Prior Function Prior Level of Function : Independent/Modified Independent               ADLs Comments: works here in [employer]   Dominant Hand: Left    Extremity/Trunk Assessment   Upper Extremity Assessment Upper Extremity Assessment: Overall WFL for tasks assessed    Lower Extremity Assessment Lower Extremity Assessment: Overall WFL for tasks assessed    Cervical / Trunk Assessment Cervical / Trunk Assessment: Normal  Communication   Communication: No difficulties  Cognition Arousal/Alertness: Awake/alert Behavior During Therapy: WFL for tasks assessed/performed Overall Cognitive Status: Within Functional Limits for tasks assessed                                          General Comments      Exercises     Assessment/Plan    PT Assessment Patient does not need any further PT services  PT Problem List         PT Treatment Interventions      PT Goals (Current goals can be found in the Care Plan section)  Acute Rehab PT Goals PT Goal Formulation: All assessment and education complete, DC therapy    Frequency       Co-evaluation               AM-PAC PT "6 Clicks" Mobility  Outcome Measure Help needed turning from your back to your  side while in a flat bed without using bedrails?: None Help needed moving from lying on your back to sitting on the side of a flat bed without using bedrails?: None Help needed moving to and from a bed to a chair (including a wheelchair)?: None Help needed standing up from a chair using your arms (e.g., wheelchair or bedside chair)?: None Help needed to walk in hospital room?: None Help needed climbing 3-5 steps with a railing? : None 6 Click Score: 24    End of Session Equipment Utilized During Treatment: Gait belt Activity Tolerance: Patient tolerated treatment well Patient left: in bed;with call bell/phone within reach        Time: 1510-1529 PT Time Calculation (min) (ACUTE ONLY): 19 min   Charges:   PT Evaluation $PT Eval Low Complexity: 1 Low          Sheran Lawless, PT Acute Rehabilitation Services Office:[phone] 06/11/2022   Elray Mcgregor 06/11/2022, 5:53 PM

## 2022-06-11 NOTE — TOC Benefit Eligibility Note (Signed)
Patient Advocate Encounter  Insurance verification completed.    The patient is currently admitted and upon discharge could be taking Eliquis 5 mg.  The current 30 day co-pay is $442.00 due to a $395.00 deductible.  Will be $47.00 once deductible is met.   The patient is insured through [employer] Part D     Jeffrey Wrenn, CPhT Pharmacy Patient Advocate Specialist [facility] Patient Advocate Team Direct Number: [phone]  Fax: [phone]        

## 2022-06-11 NOTE — Telephone Encounter (Signed)
Pharmacy Patient Advocate Encounter  Insurance verification completed.    The patient is insured through [employer] Medicare Part D   The patient is currently admitted and ran test claims for the following: Eliquis .  Copays and coinsurance results were relayed to Inpatient clinical team.  

## 2022-06-11 NOTE — ED Notes (Signed)
Inpatient RN Leeanne Deed still has not responded, another secure message sent to advised that pt to MRI at this time then will be need to go to inpatient unit 6N10C once MRI competed.

## 2022-06-11 NOTE — Discharge Instructions (Signed)

## 2022-06-11 NOTE — ED Notes (Signed)
Receiving RN Leeanne Deed has agreed to accept [facility] once pt has arrived to inpatient unit, all questions and concerns address. Pt in MRI at this time, will arrive to 6N once MRI completed

## 2022-06-11 NOTE — H&P (Signed)
History and Physical    Wayne Molina ZOX:[id number] DOB: 09/06/1956 DOA: 06/10/2022  PCP: Lind Covert, MD  Patient coming from: home  I have personally briefly reviewed patient's old medical records in [place]  Chief Complaint:  near syncope   HPI: Wayne Molina is a 66 y.o. male with medical history significant of  , HTN, LUTS, OSA untreated, OA s/p vertebral fx, Afib solely on ASA with CHA2DS2-VASc score of  2 forage and hypertension. Also of note patient  has associated history of one syncopal episode for which he had loop recorder placed. Per cardiology note  patient loop recorder noted nocturnal pauses  3-4 sec which where noted to be asymptomatic. Patient now present to ED with episode of near syncope with persistent feeling of lightheaded that began am of presentation. Patient stated episode began when he walked to mail box. He notes no palpitations or chest pain but did endorse nausea and flushing.  He notes that he has worked out earlier in the day and tolerated his exercise regimen w/o any symptoms. He notes currently symptoms have almost completely resolved.  He notes that a few weeks ago he new blood pressure medication amlodipine was added to his regimen other wise no new changes.  He denies recent illness , cough fever/ chills/ dysuria or abdominal pain.   ED Course:Mihai Croitoru, MD  Afeb, bp 132/76 hr 84, sat 96% on ra  EKG: afib with cvr 75,  diffuse t wave inversions no sig change from prio /[id number] Wbc: 8.8, hgb 15.6, plt 235 Na 136, K 4, cr 1.02 Ce 22,29  Review of Systems: As per HPI otherwise 10 point review of systems negative.   Past Medical History:  Diagnosis Date   Anxiety    Arthritis    fingers   Chronic atrial fibrillation ([place])    Depression    Family history of breast cancer    Family history of cancer of male genital organ    Family history of gene mutation    BRIP1   Family history of malignant neoplasm of gastrointestinal tract     Hyperlipidemia    Hypertension    Personal history of colonic polyps 11/12/2006   Sleep apnea     Past Surgical History:  Procedure Laterality Date   2-D echocardiogram  07/22/2010   Ejection fraction greater than 55%. Left atrium moderately dilated. Right atrium moderately dilated. 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 Mild to Moderate MR. Mild to moderate TR.   ATRIAL ABLATION SURGERY  2004   EP IMPLANTABLE DEVICE N/A 02/11/2016   Procedure: Loop Recorder Insertion;  Surgeon: Sanda Klein, MD;  Location: [place] [facility];  Service: Cardiovascular;  Laterality: N/A;   Exercise Myoview stress test  07/08/2000   Nonischemic low-risk.   HIP FRACTURE SURGERY  1970s   IR GENERIC HISTORICAL  01/21/2017   IR RADIOLOGIST EVAL & MGMT 01/21/2017 [facility]     reports that he has never smoked. He has never used smokeless tobacco. He reports that he does not drink alcohol and does not use drugs.  No Known Allergies  Family History  Problem Relation Age of Onset   Dementia Mother    Heart disease Father    Diabetes Father    Other Sister        BRIP1 gene mutation   Kidney disease Brother    Colon cancer Maternal Uncle    Lung cancer Paternal Aunt        d. >50  Cancer Paternal Aunt        unknown type, d. >50   Cancer Cousin 17       gynecologic (paternal first cousin)   Colon cancer Nephew 45       arising in colon polyp   Cancer Niece 53       gynecologic; niece in her 45's   Breast cancer Niece 64   Other Niece        BRIP1 gene mutation   Cancer Paternal Great-grandmother        abdominal cancer (PGF's mother) great grandmother    Prior to Admission medications   Medication Sig Start Date End Date Taking? Authorizing Provider  alendronate (FOSAMAX) 70 MG tablet Take 1 tablet (70 mg total) by mouth once a week. Take with a full glass of water on an empty stomach. 06/02/22   Lind Covert, MD  amLODipine (NORVASC) 2.5 MG tablet Take 1 tablet (2.5 mg total) by mouth  daily. 05/26/22   Lind Covert, MD  aspirin 81 MG tablet Take 81 mg by mouth daily.    [provider]  atorvastatin (LIPITOR) 40 MG tablet Take 1 tablet (40 mg total) by mouth daily. 06/02/22   Lind Covert, MD  calcium carbonate (OSCAL) 1500 (600 Ca) MG TABS tablet Take by mouth 2 (two) times daily with a meal.    [provider]  ibuprofen (ADVIL,MOTRIN) 800 MG tablet Take 800 mg by mouth every 8 (eight) hours as needed for mild pain or moderate pain.    [provider]  losartan (COZAAR) 100 MG tablet Take 1 tablet (100 mg total) by mouth daily. 05/26/22   Lind Covert, MD  Magnesium 250 MG TABS Take 1 tablet by mouth daily.    [provider]  MULTIPLE VITAMIN PO Take 1 tablet by mouth daily.    [provider]  sildenafil (VIAGRA) 50 MG tablet Take 2 tablets (100 mg total) by mouth daily as needed. 05/26/22   Lind Covert, MD  tiZANidine (ZANAFLEX) 4 MG tablet Take 1 tablet (4 mg total) by mouth every 8 (eight) hours as needed for muscle spasms. Patient not taking: Reported on 05/26/2022 11/09/21   Barrett Henle, MD  Zoster Vaccine Adjuvanted [facility]) injection Inject 0.5 mLs into the muscle once, repeat in 2 months 05/26/22   Lind Covert, MD    Physical Exam: Vitals:   06/11/22 0200 06/11/22 0201 06/11/22 0306 06/11/22 0420  BP: (!) 188/104 (!) 188/104 (!) 153/93 (!) 140/73  Pulse: 82 87 64 65  Resp: 16   16  Temp: 97.6 F (36.4 C) 97.6 F (36.4 C)  (!) 97.4 F (36.3 C)  TempSrc: Oral Oral  Oral  SpO2: 100% 100%  100%  Weight:      Height:         Vitals:   06/11/22 0200 06/11/22 0201 06/11/22 0306 06/11/22 0420  BP: (!) 188/104 (!) 188/104 (!) 153/93 (!) 140/73  Pulse: 82 87 64 65  Resp: 16   16  Temp: 97.6 F (36.4 C) 97.6 F (36.4 C)  (!) 97.4 F (36.3 C)  TempSrc: Oral Oral  Oral  SpO2: 100% 100%  100%  Weight:      Height:      Constitutional: NAD, calm, comfortable Eyes:  PERRL, lids and conjunctivae normal ENMT: Mucous membranes are moist. Posterior pharynx clear of any exudate or lesions.Normal dentition.  Neck: normal, supple, no masses, no thyromegaly Respiratory: clear to  auscultation bilaterally, no wheezing, no crackles. Normal respiratory effort. No accessory muscle use.  Cardiovascular: irregular  rhythm, no murmurs / rubs / gallops. No extremity edema. 2+ pedal pulses.  Abdomen: no tenderness, no masses palpated. No hepatosplenomegaly. Bowel sounds positive.  Musculoskeletal: no clubbing / cyanosis. No joint deformity upper and lower extremities. Good ROM, no contractures. Normal muscle tone.  Skin: no rashes, lesions, ulcers. No induration Neurologic: CN 2-12 grossly intact. Sensation intact, Strength 5/5 in all 4.  Psychiatric: Normal judgment and insight. Alert and oriented x 3. Normal mood.    Labs on Admission: I have personally reviewed following labs and imaging studies  CBC: Recent Labs  Lab 06/10/22 1339 06/11/22 0024  WBC 8.8 6.8  HGB 15.6 17.1*  HCT 46.2 50.5  MCV 91.5 90.5  PLT 235 161   Basic Metabolic Panel: Recent Labs  Lab 06/10/22 1339 06/11/22 0024  NA 136  --   K 4.0  --   CL 104  --   CO2 26  --   GLUCOSE 101*  --   BUN 22  --   CREATININE 1.02 0.87  CALCIUM 9.9  --    GFR: Estimated Creatinine Clearance: 81.3 mL/min (by C-G formula based on SCr of 0.87 mg/dL). Liver Function Tests: No results for input(s): "AST", "ALT", "ALKPHOS", "BILITOT", "PROT", "ALBUMIN" in the last 168 hours. No results for input(s): "LIPASE", "AMYLASE" in the last 168 hours. No results for input(s): "AMMONIA" in the last 168 hours. Coagulation Profile: No results for input(s): "INR", "PROTIME" in the last 168 hours. Cardiac Enzymes: No results for input(s): "CKTOTAL", "CKMB", "CKMBINDEX", "TROPONINI" in the last 168 hours. BNP (last 3 results) No results for input(s): "PROBNP" in the last 8760 hours. HbA1C: No results for  input(s): "HGBA1C" in the last 72 hours. CBG: Recent Labs  Lab 06/10/22 1338 06/11/22 0502  GLUCAP 101* 89   Lipid Profile: No results for input(s): "CHOL", "HDL", "LDLCALC", "TRIG", "CHOLHDL", "LDLDIRECT" in the last 72 hours. Thyroid Function Tests: Recent Labs    06/11/22 0024  TSH 2.199   Anemia Panel: No results for input(s): "VITAMINB12", "FOLATE", "FERRITIN", "TIBC", "IRON", "RETICCTPCT" in the last 72 hours. Urine analysis:    Component Value Date/Time   COLORURINE YELLOW 06/10/2022 1949   APPEARANCEUR CLEAR 06/10/2022 1949   LABSPEC 1.010 06/10/2022 1949   PHURINE 5.0 06/10/2022 1949   GLUCOSEU NEGATIVE 06/10/2022 1949   HGBUR NEGATIVE 06/10/2022 1949   BILIRUBINUR NEGATIVE 06/10/2022 1949   KETONESUR 20 (A) 06/10/2022 1949   PROTEINUR 30 (A) 06/10/2022 1949   NITRITE NEGATIVE 06/10/2022 1949   LEUKOCYTESUR NEGATIVE 06/10/2022 1949    Radiological Exams on Admission: DG Chest 2 View  Result Date: 06/10/2022 CLINICAL DATA:  Weakness, near syncope. EXAM: CHEST - 2 VIEW COMPARISON:  09/03/2012. FINDINGS: Trachea is midline. Heart size stable. Loop recorder projects over the left heart border. Thoracic aorta is calcified. Lungs are clear. No pleural fluid. IMPRESSION: No acute findings. Electronically Signed   By: Lorin Picket M.D.   On: 06/10/2022 14:46    EKG: Independently reviewed. See above  Assessment/Plan Near Syncope -concern for cardiac cause related to hx of arrhythmia /pauses -mild elevated CE post likely related to demand but will cycle r/o ACS, EkG at baseline  -cardiology consult  -re-peat echo  -monitor on tele  -continue asa   HTN -uncontrolled on presentation -resume losartan and amlodipine  -currently 140/73  Luts  -not on treatment currently  -monitor for urinary retention  OSA  -  intolerant of cpap  -monitor continuous pulse ox  Osteoporosis -s/p vertebral fx -on fosamax     DVT prophylaxis: heparin Code Status:  full Family Communication: none at bedside Disposition Plan: none Consults called: cardiology  patient of Dr Sanda Klein, MD Admission status obs   Clance Boll MD [facility]  If 7PM-7AM, please contact night-coverage [email or web address] Password [facility]  06/11/2022, 5:48 AM

## 2022-06-11 NOTE — ED Notes (Signed)
Visual merchandiser for [employer], they are currently assigning inpatient nurse at this time so handoff may occur.

## 2022-06-12 ENCOUNTER — Telehealth: Payer: Self-pay | Admitting: Student

## 2022-06-12 ENCOUNTER — Other Ambulatory Visit: Payer: Self-pay | Admitting: Physician Assistant

## 2022-06-12 ENCOUNTER — Other Ambulatory Visit (HOSPITAL_COMMUNITY): Payer: Self-pay

## 2022-06-12 ENCOUNTER — Observation Stay (HOSPITAL_BASED_OUTPATIENT_CLINIC_OR_DEPARTMENT_OTHER)
Admit: 2022-06-12 | Discharge: 2022-06-12 | Disposition: A | Discharge status: Home / Self Care | Payer: No Typology Code available for payment source | Attending: Physician Assistant | Admitting: Physician Assistant

## 2022-06-12 DIAGNOSIS — R55 Syncope and collapse: Secondary | ICD-10-CM | POA: Diagnosis not present

## 2022-06-12 DIAGNOSIS — R42 Dizziness and giddiness: Secondary | ICD-10-CM | POA: Diagnosis not present

## 2022-06-12 LAB — GLUCOSE, CAPILLARY: Glucose-Capillary: 106 mg/dL — ABNORMAL HIGH (ref 70–99)

## 2022-06-12 MED ORDER — TAMSULOSIN HCL 0.4 MG PO CAPS
0.4000 mg | ORAL_CAPSULE | Freq: Every day | ORAL | 0 refills | Status: DC
  Filled 2022-06-12: qty 30, 30d supply, fill #0

## 2022-06-12 MED ORDER — TAMSULOSIN HCL 0.4 MG PO CAPS
0.4000 mg | ORAL_CAPSULE | Freq: Every day | ORAL | Status: DC
  Administered 2022-06-12: 0.4 mg via ORAL
  Filled 2022-06-12: qty 1

## 2022-06-12 MED ORDER — APIXABAN 5 MG PO TABS
5.0000 mg | ORAL_TABLET | Freq: Two times a day (BID) | ORAL | 0 refills | Status: DC
  Filled 2022-06-12: qty 60, 30d supply, fill #0

## 2022-06-12 MED ORDER — AMLODIPINE BESYLATE 10 MG PO TABS
10.0000 mg | ORAL_TABLET | Freq: Every day | ORAL | 0 refills | Status: DC
  Filled 2022-06-12: qty 30, 30d supply, fill #0

## 2022-06-12 MED ORDER — AMLODIPINE BESYLATE 10 MG PO TABS: 10.0000 mg | ORAL_TABLET | Freq: Every day | ORAL | Status: DC

## 2022-06-12 MED ORDER — HYDRALAZINE HCL 20 MG/ML IJ SOLN: 10.0000 mg | INTRAMUSCULAR | Status: DC | PRN

## 2022-06-12 NOTE — Discharge Summary (Signed)
Physician Discharge Summary  Wayne Molina YHC:[id number] DOB: 1956/04/10 DOA: 06/10/2022  PCP: Carney Living, MD  Admit date: 06/10/2022 Discharge date: 06/12/2022  Admitted From: Home Disposition:  Home  Discharge Condition:Stable CODE STATUS:FULL Diet recommendation: Heart Healthy   Brief/Interim Summary:  Patient is a 66 year old male with history of hypertension, untreated OSA, osteoarthritis, paroxysmal atrial fibrillation on aspirin, syncope who presented with complaints of near syncopal with persistent feeling of lightheaded at home when he walked to the mailbox.  He has history of syncope and had a loop recorder placed by cardiology in the past.  No history of chest pain or palpitation.  On presentation his symptoms completely resolved.  He recently was started on amlodipine for his hypertension.  On presentation he was hemodynamically stable, EKG showed A-fib with controlled rate.  Patient was admitted for the management of syncope .  Cardiology also consulted .  Hospital course remarkable for persistent hypertension.  Also started on Eliquis for A-fib.  Repeat orthostatic vitals were negative, PT did not recommend any follow-up.  Cardiology recommended 14-day ZIO AT monitor on discharge.  Medically stable for discharge today.  He will be called by cardiology for follow-up appointment.  Following problems were addressed during hospitalization:  Near syncope: Presented with lightheadedness, had near syncopal event when he went to the mailbox at home.  Asymptomatic on presentation.  Has history of syncope and was placed loop recorder. Echo showed   EF of 60 to 65%, indeterminate diastolic parameters, normal right ventricular function, severely dilated left and right atrial size, normal valves . orthostatic vitals checked on presentation seems to be positive with systolic blood pressure falling from 167 to 146 mmHg while standing for 3 minutes.  Started on IV fluids.  Repeat orthostatic  vitals negative.  PT did not recommend any follow-up Cardiology was also following Underwent 14 days ZIO AT monitor placement before discharge.  He will follow-up with cardiology as an outpatient   Paroxysmal A-fib: Only on aspirin at home.CHA2DS2-VASc score of  2.  Rate is controlled.  Started on Eliquis, aspirin discontinued   Hypertension: Hypertensive on presentation, remained hypertensive during this hospitalization.  On amlodipine 2.5 mg daily and losartan 100 mg at home.  Increase amlodipine to 10 mg daily  OSA: Intolerant of CPAP.   History of osteoporosis: Status post vertebral fixation.  On Fosamax     Discharge Diagnoses:  Principal Problem:   Postural dizziness with presyncope    Discharge Instructions  Discharge Instructions     Diet - low sodium heart healthy   Complete by: As directed    Discharge instructions   Complete by: As directed    1)Please take prescribed medications as instructed 2)Follow up with your PCP in a week.  Monitor blood pressure at home 3)You will be called by cardiology for follow-up appointment   Increase activity slowly   Complete by: As directed       Allergies as of 06/12/2022   No Known Allergies      Medication List     STOP taking these medications    aspirin 81 MG tablet       TAKE these medications    acetaminophen 500 MG tablet Commonly known as: TYLENOL Take 500 mg by mouth daily as needed (muscle pain).   alendronate 70 MG tablet Commonly known as: FOSAMAX Take 1 tablet (70 mg total) by mouth once a week. Take with a full glass of water on an empty stomach.   amLODipine 10  MG tablet Commonly known as: NORVASC Take 1 tablet (10 mg total) by mouth daily. Start taking on: June 13, 2022 What changed:  medication strength how much to take   apixaban 5 MG Tabs tablet Commonly known as: ELIQUIS Take 1 tablet (5 mg total) by mouth 2 (two) times daily.   atorvastatin 40 MG tablet Commonly known as:  LIPITOR Take 1 tablet (40 mg total) by mouth daily.   calcium carbonate 1500 (600 Ca) MG Tabs tablet Commonly known as: OSCAL Take by mouth 2 (two) times daily with a meal.   ibuprofen 800 MG tablet Commonly known as: ADVIL Take 800 mg by mouth daily as needed for mild pain or moderate pain.   losartan 100 MG tablet Commonly known as: COZAAR Take 1 tablet (100 mg total) by mouth daily. What changed: when to take this   Magnesium 250 MG Tabs Take 1 tablet by mouth daily.   MULTIPLE VITAMIN PO Take 1 tablet by mouth daily.   POTASSIUM PO Take 1 tablet by mouth daily.   Shingrix injection Generic drug: Zoster Vaccine Adjuvanted Inject 0.5 mLs into the muscle once, repeat in 2 months   sildenafil 50 MG tablet Commonly known as: VIAGRA Take 2 tablets (100 mg total) by mouth daily as needed. What changed: reasons to take this   tamsulosin 0.4 MG Caps capsule Commonly known as: FLOMAX Take 1 capsule (0.4 mg total) by mouth daily. Start taking on: June 13, 2022   VITAMIN B-12 PO Take 1 capsule by mouth daily.        Follow-up Information     Croitoru, Mihai, MD Follow up.   Specialty: Cardiology Why: office scheduler will contact you to arrange follow up Contact information: [address] [place] [address] [phone]         Carney Living, MD. Schedule an appointment as soon as possible for a visit in 1 week(s).   Specialty: Family Medicine Contact information: [address] [place] [address] [phone]                No Known Allergies  Consultations: Cardiology   Procedures/Studies: ECHOCARDIOGRAM COMPLETE  Result Date: 06/11/2022    ECHOCARDIOGRAM REPORT   Patient Name:   Wayne Molina Date of Exam: 06/11/2022 Medical Rec #:  [id number]     Height:       69.0 in Accession #:    [id number]    Weight:       163.0 lb Date of Birth:  1956/07/07      BSA:          1.894 m Patient Age:    66 years      BP:            154/89 mmHg Patient Gender: M             HR:           72 bpm. Exam Location:  Inpatient Procedure: 2D Echo Indications:    syncope  History:        Patient has prior history of Echocardiogram examinations, most                 recent 06/23/2018. Arrythmias:Atrial Fibrillation; Risk                 Factors:Dyslipidemia, Hypertension and Sleep Apnea.  Sonographer:    Delcie Roch RDCS Referring Phys: [id number] SARA-MAIZ A THOMAS IMPRESSIONS  1. 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 Left ventricular ejection fraction, by  estimation, is 60 to 65%. The left ventricle has normal function. The left ventricle has no regional wall motion abnormalities. There is mild concentric left ventricular hypertrophy. Left ventricular diastolic parameters are indeterminate.  2. Right ventricular systolic function is normal. The right ventricular size is normal. There is normal pulmonary artery systolic pressure.  3. Left atrial size was severely dilated.  4. Right atrial size was severely dilated.  5. The mitral valve is normal in structure. No evidence of mitral valve regurgitation. No evidence of mitral stenosis.  6. Tricuspid valve regurgitation is mild to moderate.  7. The aortic valve is normal in structure. Aortic valve regurgitation is trivial. Aortic valve sclerosis is present, with no evidence of aortic valve stenosis.  8. The inferior vena cava is normal in size with greater than 50% respiratory variability, suggesting right atrial pressure of 3 mmHg. 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 Recommend Cardiac MR. 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 Left ventricular ejection  fraction, by estimation, is 60 to 65%. The left ventricle has normal function. The left ventricle has no regional wall motion abnormalities. Definity contrast agent was given IV to delineate the left ventricular endocardial borders. The left ventricular  internal cavity size was normal in size. There is mild concentric left ventricular hypertrophy. Left ventricular diastolic parameters are indeterminate. Right Ventricle: The right ventricular size is normal. No increase in right ventricular wall thickness. Right ventricular systolic function is normal. There is normal pulmonary artery systolic pressure. The tricuspid regurgitant velocity is 2.48 m/s, and  with an assumed right atrial pressure of 3 mmHg, the estimated right ventricular systolic pressure is 27.6 mmHg. Left Atrium: Left atrial size was severely dilated. Right Atrium: Right atrial size was severely dilated. Pericardium: There is no evidence of pericardial effusion. Presence of epicardial fat layer. Mitral Valve: The mitral valve is normal in structure. No evidence of mitral valve regurgitation. No evidence of mitral valve stenosis. Tricuspid Valve: The tricuspid valve is normal in structure. Tricuspid valve regurgitation is mild to moderate. No evidence of tricuspid stenosis. Aortic Valve: The aortic valve is normal in structure. Aortic valve regurgitation is trivial. Aortic regurgitation PHT measures 585 msec. Aortic valve sclerosis is present, with no evidence of aortic valve stenosis. Pulmonic Valve: The pulmonic valve was normal in structure. Pulmonic valve regurgitation is not visualized. No evidence of pulmonic stenosis. Aorta: The aortic root is normal in size and structure. Venous: The inferior vena cava is normal in size with greater than 50% respiratory variability, suggesting right atrial pressure of 3 mmHg. IAS/Shunts: No atrial level shunt  detected by color flow Doppler.  LEFT VENTRICLE PLAX 2D LVIDd:         4.30 cm LVIDs:         2.70 cm LV PW:         1.20 cm LV IVS:        1.30 cm LVOT diam:     2.00 cm LV SV:         46 LV SV Index:   24 LVOT Area:     3.14 cm  RIGHT VENTRICLE  IVC RV S prime:     10.20 cm/s  IVC diam: 2.00 cm TAPSE (M-mode): 1.5 cm LEFT ATRIUM              Index        RIGHT ATRIUM           Index LA diam:        4.90 cm  2.59 cm/m   RA Area:     22.90 cm LA Vol (A2C):   135.0 ml 71.28 ml/m  RA Volume:   65.10 ml  34.37 ml/m LA Vol (A4C):   121.0 ml 63.89 ml/m LA Biplane Vol: 129.0 ml 68.11 ml/m  AORTIC VALVE LVOT Vmax:   73.10 cm/s LVOT Vmean:  46.300 cm/s LVOT VTI:    0.145 m AI PHT:      585 msec  AORTA Ao Root diam: 3.20 cm Ao Asc diam:  2.90 cm TRICUSPID VALVE TR Peak grad:   24.6 mmHg TR Vmax:        248.00 cm/s  SHUNTS Systemic VTI:  0.14 m Systemic Diam: 2.00 cm Kardie Tobb DO Electronically signed by Thomasene Ripple DO Signature Date/Time: 06/11/2022/3:18:09 PM    Final    DG Chest 2 View  Result Date: 06/10/2022 CLINICAL DATA:  Weakness, near syncope. EXAM: CHEST - 2 VIEW COMPARISON:  09/03/2012. FINDINGS: Trachea is midline. Heart size stable. Loop recorder projects over the left heart border. Thoracic aorta is calcified. Lungs are clear. No pleural fluid. IMPRESSION: No acute findings. Electronically Signed   By: Leanna Battles M.D.   On: 06/10/2022 14:46      Subjective: Patient seen and examined at the bedside this morning.  Hypertensive but otherwise hemodynamically stable.  Comfortable, denies any chest pain or lightheadedness or dizziness.  Medically stable for discharge today  Discharge Exam: Vitals:   06/12/22 0426 06/12/22 0808  BP: (!) 151/98 (!) 150/103  Pulse: 71 61  Resp: 18 17  Temp: 97.9 F (36.6 C) 98 F (36.7 C)  SpO2: 94% 100%   Vitals:   06/11/22 1825 06/11/22 2007 06/12/22 0426 06/12/22 0808  BP: (!) 154/101 110/76 (!) 151/98 (!) 150/103  Pulse: 70 73 71 61   Resp: 16 18 18 17   Temp: 97.7 F (36.5 C) 98 F (36.7 C) 97.9 F (36.6 C) 98 F (36.7 C)  TempSrc: Oral Oral Oral   SpO2: 100% 96% 94% 100%  Weight:      Height:        General: Pt is alert, awake, not in acute distress Cardiovascular: RRR, S1/S2 +, no rubs, no gallops Respiratory: CTA bilaterally, no wheezing, no rhonchi Abdominal: Soft, NT, ND, bowel sounds + Extremities: no edema, no cyanosis    The results of significant diagnostics from this hospitalization (including imaging, microbiology, ancillary and laboratory) are listed below for reference.     Microbiology: No results found for this or any previous visit (from the past 240 hour(s)).   Labs: BNP (last 3 results) No results for input(s): "BNP" in the last 8760 hours. Basic Metabolic Panel: Recent Labs  Lab 06/10/22 1339 06/11/22 0024 06/11/22 0630  NA 136  --  141  K 4.0  --  3.9  CL 104  --  106  CO2 26  --  27  GLUCOSE 101*  --  80  BUN 22  --  19  CREATININE 1.02 0.87 0.93  CALCIUM 9.9  --  9.3   Liver Function Tests: Recent Labs  Lab 06/11/22 0630  AST 27  ALT 24  ALKPHOS 45  BILITOT 2.7*  PROT 6.5  ALBUMIN 3.7   No results for input(s): "LIPASE", "AMYLASE" in the last 168 hours. No results for input(s): "AMMONIA" in the last 168 hours. CBC: Recent Labs  Lab 06/10/22 1339 06/11/22 0024 06/11/22 0630  WBC 8.8 6.8 5.8  HGB 15.6 17.1* 15.3  HCT 46.2 50.5 44.5  MCV 91.5 90.5 90.3  PLT 235 261 235   Cardiac Enzymes: No results for input(s): "CKTOTAL", "CKMB", "CKMBINDEX", "TROPONINI" in the last 168 hours. BNP: Invalid input(s): "POCBNP" CBG: Recent Labs  Lab 06/10/22 1338 06/11/22 0502 06/12/22 0510  GLUCAP 101* 89 106*   D-Dimer Recent Labs    06/10/22 1339  DDIMER 0.27   Hgb A1c No results for input(s): "HGBA1C" in the last 72 hours. Lipid Profile No results for input(s): "CHOL", "HDL", "LDLCALC", "TRIG", "CHOLHDL", "LDLDIRECT" in the last 72 hours. Thyroid  function studies Recent Labs    06/11/22 0024  TSH 2.199   Anemia work up No results for input(s): "VITAMINB12", "FOLATE", "FERRITIN", "TIBC", "IRON", "RETICCTPCT" in the last 72 hours. Urinalysis    Component Value Date/Time   COLORURINE YELLOW 06/10/2022 1949   APPEARANCEUR CLEAR 06/10/2022 1949   LABSPEC 1.010 06/10/2022 1949   PHURINE 5.0 06/10/2022 1949   GLUCOSEU NEGATIVE 06/10/2022 1949   HGBUR NEGATIVE 06/10/2022 1949   BILIRUBINUR NEGATIVE 06/10/2022 1949   KETONESUR 20 (A) 06/10/2022 1949   PROTEINUR 30 (A) 06/10/2022 1949   NITRITE NEGATIVE 06/10/2022 1949   LEUKOCYTESUR NEGATIVE 06/10/2022 1949   Sepsis Labs Recent Labs  Lab 06/10/22 1339 06/11/22 0024 06/11/22 0630  WBC 8.8 6.8 5.8   Microbiology No results found for this or any previous visit (from the past 240 hour(s)).  Please note: You were cared for by a hospitalist during your hospital stay. Once you are discharged, your primary care physician will handle any further medical issues. Please note that NO REFILLS for any discharge medications will be authorized once you are discharged, as it is imperative that you return to your primary care physician (or establish a relationship with a primary care physician if you do not have one) for your post hospital discharge needs so that they can reassess your need for medications and monitor your lab values.    Time coordinating discharge: 40 minutes  SIGNED:   Burnadette Pop, MD  [facility] 06/12/2022, 3:21 PM Pager [phone]  If 7PM-7AM, please contact night-coverage [email or web address] Password [email or web address]

## 2022-06-12 NOTE — Progress Notes (Signed)
Per recommendation by Dr. Jacques Navy, will place 14 day Zio AT monitor in the hospital today

## 2022-06-12 NOTE — Progress Notes (Signed)
Progress Note  Patient Name: Wayne Molina Date of Encounter: 06/12/2022  [facility] Cardiologist: None Dr. Royann Shivers  Subjective   Ambulating in room, ready to go home.   Inpatient Medications    Scheduled Meds:  [START ON 06/13/2022] amLODipine  10 mg Oral Daily   apixaban  5 mg Oral BID   losartan  100 mg Oral Daily   sodium chloride flush  3 mL Intravenous Q12H   tamsulosin  0.4 mg Oral Daily   Continuous Infusions:  PRN Meds: acetaminophen **OR** acetaminophen, hydrALAZINE, ondansetron **OR** ondansetron (ZOFRAN) IV   Vital Signs    Vitals:   06/11/22 1825 06/11/22 2007 06/12/22 0426 06/12/22 0808  BP: (!) 154/101 110/76 (!) 151/98 (!) 150/103  Pulse: 70 73 71 61  Resp: 16 18 18 17   Temp: 97.7 F (36.5 C) 98 F (36.7 C) 97.9 F (36.6 C) 98 F (36.7 C)  TempSrc: Oral Oral Oral   SpO2: 100% 96% 94% 100%  Weight:      Height:        Intake/Output Summary (Last 24 hours) at 06/12/2022 1442 Last data filed at 06/12/2022 0844 Gross per 24 hour  Intake 360 ml  Output --  Net 360 ml      06/10/2022    9:39 PM 05/26/2022    9:04 AM 02/20/2022    8:40 AM  Last 3 Weights  Weight (lbs) 163 lb 163 lb 12.8 oz 164 lb  Weight (kg) 73.936 kg 74.299 kg 74.39 kg      Telemetry    Afib, with pauses intermittently up to 2-2.5 seconds - Personally Reviewed  ECG    No new - Personally Reviewed  Physical Exam   GEN: No acute distress.   Neck: No JVD Cardiac: iRRR, no murmurs, rubs, or gallops.  Respiratory: Clear to auscultation bilaterally. GI: Soft, nontender, non-distended  MS: No edema; No deformity. Neuro:  Nonfocal  Psych: Normal affect   Labs    High Sensitivity Troponin:   Recent Labs  Lab 06/10/22 1339 06/10/22 1825  TROPONINIHS 22* 29*     Chemistry Recent Labs  Lab 06/10/22 1339 06/11/22 0024 06/11/22 0630  NA 136  --  141  K 4.0  --  3.9  CL 104  --  106  CO2 26  --  27  GLUCOSE 101*  --  80  BUN 22  --  19  CREATININE 1.02 0.87  0.93  CALCIUM 9.9  --  9.3  PROT  --   --  6.5  ALBUMIN  --   --  3.7  AST  --   --  27  ALT  --   --  24  ALKPHOS  --   --  45  BILITOT  --   --  2.7*  GFRNONAA >60 >60 >60  ANIONGAP 6  --  8    Lipids No results for input(s): "CHOL", "TRIG", "HDL", "LABVLDL", "LDLCALC", "CHOLHDL" in the last 168 hours.  Hematology Recent Labs  Lab 06/10/22 1339 06/11/22 0024 06/11/22 0630  WBC 8.8 6.8 5.8  RBC 5.05 5.58 4.93  HGB 15.6 17.1* 15.3  HCT 46.2 50.5 44.5  MCV 91.5 90.5 90.3  MCH 30.9 30.6 31.0  MCHC 33.8 33.9 34.4  RDW 12.7 12.8 12.6  PLT 235 261 235   Thyroid  Recent Labs  Lab 06/11/22 0024  TSH 2.199    BNPNo results for input(s): "BNP", "PROBNP" in the last 168 hours.  DDimer  Recent  Labs  Lab 06/10/22 1339  DDIMER 0.27     Radiology    ECHOCARDIOGRAM COMPLETE  Result Date: 06/11/2022    ECHOCARDIOGRAM REPORT   Patient Name:   Wayne Molina Date of Exam: 06/11/2022 Medical Rec #:  [id number]     Height:       69.0 in Accession #:    [id number]    Weight:       163.0 lb Date of Birth:  Mar 02, 1956      BSA:          1.894 m Patient Age:    66 years      BP:           154/89 mmHg Patient Gender: M             HR:           72 bpm. Exam Location:  Inpatient Procedure: 2D Echo Indications:    syncope  History:        Patient has prior history of Echocardiogram examinations, most                 recent 06/23/2018. Arrythmias:Atrial Fibrillation; Risk                 Factors:Dyslipidemia, Hypertension and Sleep Apnea.  Sonographer:    Delcie Roch RDCS Referring Phys: [id number] SARA-MAIZ A THOMAS IMPRESSIONS  1. Thickened left ventricle increased hypertrophy in the apex, basal/apex wall thickness ratio> 1.5 and spade shaped appearance on contrast imaging all consistent with apical variant of hypertrophic cardiomyopathy. Left ventricular ejection fraction, by  estimation, is 60 to 65%. The left ventricle has normal function. The left ventricle has no regional wall motion  abnormalities. There is mild concentric left ventricular hypertrophy. Left ventricular diastolic parameters are indeterminate.  2. Right ventricular systolic function is normal. The right ventricular size is normal. There is normal pulmonary artery systolic pressure.  3. Left atrial size was severely dilated.  4. Right atrial size was severely dilated.  5. The mitral valve is normal in structure. No evidence of mitral valve regurgitation. No evidence of mitral stenosis.  6. Tricuspid valve regurgitation is mild to moderate.  7. The aortic valve is normal in structure. Aortic valve regurgitation is trivial. Aortic valve sclerosis is present, with no evidence of aortic valve stenosis.  8. The inferior vena cava is normal in size with greater than 50% respiratory variability, suggesting right atrial pressure of 3 mmHg. 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 Recommend Cardiac MR. 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 Left ventricular ejection  fraction, by estimation, is 60 to 65%. The left ventricle has normal function. The left ventricle has no regional wall motion abnormalities. Definity contrast agent was given IV to delineate the left ventricular endocardial borders. The left ventricular  internal cavity size was normal in size. There is mild concentric left ventricular hypertrophy. Left ventricular diastolic parameters are indeterminate. Right Ventricle: The right ventricular size is normal. No increase in right ventricular wall thickness. Right ventricular systolic function is normal. There is normal pulmonary artery systolic pressure. The tricuspid regurgitant velocity is 2.48 m/s, and  with an assumed right atrial pressure of 3 mmHg, the estimated  right ventricular systolic pressure is 27.6 mmHg. Left Atrium: Left atrial size was severely dilated. Right Atrium: Right atrial size was severely  dilated. Pericardium: There is no evidence of pericardial effusion. Presence of epicardial fat layer. Mitral Valve: The mitral valve is normal in structure. No evidence of mitral valve regurgitation. No evidence of mitral valve stenosis. Tricuspid Valve: The tricuspid valve is normal in structure. Tricuspid valve regurgitation is mild to moderate. No evidence of tricuspid stenosis. Aortic Valve: The aortic valve is normal in structure. Aortic valve regurgitation is trivial. Aortic regurgitation PHT measures 585 msec. Aortic valve sclerosis is present, with no evidence of aortic valve stenosis. Pulmonic Valve: The pulmonic valve was normal in structure. Pulmonic valve regurgitation is not visualized. No evidence of pulmonic stenosis. Aorta: The aortic root is normal in size and structure. Venous: The inferior vena cava is normal in size with greater than 50% respiratory variability, suggesting right atrial pressure of 3 mmHg. IAS/Shunts: No atrial level shunt detected by color flow Doppler.  LEFT VENTRICLE PLAX 2D LVIDd:         4.30 cm LVIDs:         2.70 cm LV PW:         1.20 cm LV IVS:        1.30 cm LVOT diam:     2.00 cm LV SV:         46 LV SV Index:   24 LVOT Area:     3.14 cm  RIGHT VENTRICLE             IVC RV S prime:     10.20 cm/s  IVC diam: 2.00 cm TAPSE (M-mode): 1.5 cm LEFT ATRIUM              Index        RIGHT ATRIUM           Index LA diam:        4.90 cm  2.59 cm/m   RA Area:     22.90 cm LA Vol (A2C):   135.0 ml 71.28 ml/m  RA Volume:   65.10 ml  34.37 ml/m LA Vol (A4C):   121.0 ml 63.89 ml/m LA Biplane Vol: 129.0 ml 68.11 ml/m  AORTIC VALVE LVOT Vmax:   73.10 cm/s LVOT Vmean:  46.300 cm/s LVOT VTI:    0.145 m AI PHT:      585 msec  AORTA Ao Root diam: 3.20 cm Ao Asc diam:  2.90 cm TRICUSPID VALVE TR Peak grad:   24.6 mmHg TR Vmax:        248.00  cm/s  SHUNTS Systemic VTI:  0.14 m Systemic Diam: 2.00 cm Kardie Tobb DO Electronically signed by Thomasene Ripple DO Signature Date/Time: 06/11/2022/3:18:09 PM    Final     Cardiac Studies   Echo as above.  Patient Profile     66 y.o. male with hx permanent atrial fibrillation with spontaneously controlled ventricular response, syncope 2017, OSA not using CPAP, depression, HTN, HLD, who is being seen 06/11/2022 for the evaluation of syncope and A fib.    Assessment & Plan     Near syncope -Echocardiogram shows preserved LV function but definitive findings of apical hypertrophic cardiomyopathy.  Loop recorder worn for 3 years showed no ventricular arrhythmia, telemetry reviewed over the last 24 hours also without significant ventricular arrhythmia.  Occasional PVCs. -Orthostatics were positive on presentation, I have recommended compression socks.  He does take losartan at home and may want to consider this as a culprit, however blood pressure was quite elevated in the hospital off of these medications, can defer medication adjustment  to the outpatient setting, he is hypertensive but stable at discharge. -No evidence of obstruction on his echocardiogram from apical HCM -He notes that his symptoms improved with PDE 5 inhibitor use (Viagra).  I do wonder if he is getting vasodilation from this which may be relieving subendocardial ischemia from his apical HCM, which may contribute to shortness of breath and palpitations that he describes.  It is otherwise unclear why this medication is beneficial, may want to consider long-acting nitrate, patient in agreement to discuss this as an outpatient, he had a headache from nitroglycerin.  Elevated troponin  - Hs trop 22>29, mildly elevated - no chest pain, had lightheadedness and near syncope episode  - EKG without ischemic changes - consider ischemic evaluation given his presentation can be angina equivalent symptoms, may benefit from a PET CT with myocardial  blood flow assessment.   Dyspnea on exertion  Orthopnea Apical Hypertrophic Cardiomyopathy  - EKG and Echo in the past with evidence of apical HCM historically, without CHF symptoms  - Now reports 1 year onset of DOE with intercourse and SOB when laying flat or sitting close to people, the symptom improves with use of PDE 5 inhibitor. - clinically euvolemic at this time  -No evidence of pulmonary hypertension on echo - encouraged CPAP use for OSA   Permeant A fib with slowed ventricular response  - HR down to 48s, no pauses >3 seconds - appears within his baseline  - start Eliquis 5mg  BID, it is a class I indication in the setting of hypertrophic cardiomyopathy and atrial fibrillation to be on anticoagulation with DOAC.  Patient is in agreement. - would repeat event monitor now, will place before discharge.   HTN  - BP remains elevated, it is possible that elevated BP plus abnormal myocardium may lead to subendocardial ischemia which may contribute to his symptoms.  Consider outpatient ischemic evaluation. - on losartan and amlodipine at home, resume before discharge.  If recurrent near syncopal symptoms, consider alternate therapy particularly alternative to losartan.     For questions or updates, please contact [facility] Please consult [email or web address] for contact info under        Signed, , MD  06/12/2022, 2:42 PM

## 2022-06-12 NOTE — Telephone Encounter (Signed)
   Received page from [facility] about critical result. Dr. Jacques Navy called and spoke with rep who reported atrial fibrillation. Live monitor was placed today prior to discharge for further evaluation of naer syncope.He has known permanent atrial fibrillation so nothing needs to be done at this time. Will wait for monitor results.  Corrin Parker, PA-C 06/12/2022 7:07 PM

## 2022-06-16 ENCOUNTER — Telehealth: Payer: Self-pay

## 2022-06-17 NOTE — Telephone Encounter (Signed)
The monitor report for this patient was faxed to the [facility] on 06/16/22.

## 2022-06-25 ENCOUNTER — Other Ambulatory Visit (HOSPITAL_BASED_OUTPATIENT_CLINIC_OR_DEPARTMENT_OTHER): Payer: Self-pay

## 2022-06-25 ENCOUNTER — Telehealth (HOSPITAL_BASED_OUTPATIENT_CLINIC_OR_DEPARTMENT_OTHER): Payer: Self-pay

## 2022-06-25 NOTE — Progress Notes (Signed)
Cardiology Clinic Note   Patient Name: Wayne Molina Date of Encounter: 06/26/2022  Primary Care Provider:  Lind Covert, MD Primary Cardiologist:  Sanda Klein, MD  Patient Profile     Wayne Molina 66 year old male presents to the clinic today for follow-up evaluation of his essential hypertension , near syncope, and atrial fibrillation.   Past Medical History    Past Medical History:  Diagnosis Date   Anxiety    Arthritis    fingers   Chronic atrial fibrillation ([place])    Depression    Family history of breast cancer    Family history of cancer of male genital organ    Family history of gene mutation    BRIP1   Family history of malignant neoplasm of gastrointestinal tract    Hyperlipidemia    Hypertension    Personal history of colonic polyps 11/12/2006   Sleep apnea    Past Surgical History:  Procedure Laterality Date   2-D echocardiogram  07/22/2010   Ejection fraction greater than 55%. Left atrium moderately dilated. Right atrium moderately dilated. 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 Mild to Moderate MR. Mild to moderate TR.   ATRIAL ABLATION SURGERY  2004   EP IMPLANTABLE DEVICE N/A 02/11/2016   Procedure: Loop Recorder Insertion;  Surgeon: Sanda Klein, MD;  Location: [facility];  Service: Cardiovascular;  Laterality: N/A;   Exercise Myoview stress test  07/08/2000   Nonischemic low-risk.   HIP FRACTURE SURGERY  1970s   IR GENERIC HISTORICAL  01/21/2017   IR RADIOLOGIST EVAL & MGMT 01/21/2017 [facility]    Allergies  No Known Allergies  History of Present Illness     Wayne Molina has a PMH of hypertension, untreated OSA, osteoarthritis, paroxysmal atrial fibrillation on aspirin, and syncope.  He presented to the emergency department on 06/11/2022 and was discharged on 06/12/2022.  He previously had a loop recorder placed.  He indicated that he was feeling lightheaded at home as he walked towards his mailbox.  At the time of his evaluation  his symptoms had completely resolved.  He reported that he had been started on amlodipine for his elevated blood pressure.  His EKG showed atrial fibrillation which was rate controlled.  Cardiology was consulted.  Orthostatic vitals were negative.  He was started on apixaban and he was given a 14-day cardiac event monitor.  His echocardiogram showed an EF of 60-65%, intermediate diastolic parameters, normal RV function, severely dilated left and right atria.  He presents to the clinic today for follow-up evaluation states he had a substantual espisode of dizzness which prompted him to go to urgent care.  When he presented to urgent care they sent to the emergency department.  He continues to wear his cardiac event monitor.  He did press his button once with a slight dizzy spell while he was walking.  He pressed his button again when he noticed a red light blinking from the device.  He was told that the red light indicated that there was poor connection.  He placed a piece of tape over the device and plans to send him home today.  His blood pressure continues to be well controlled.  He continues to walk 4 miles 3 days a week and go to the gym 3 times per week.  He follows a heart healthy diet.  He reports that he was unable to tolerate CPAP and BiPAP device And weight.  We will call him with his event monitor  results and plan follow-up in 3 to 4 months.  Today he denies chest pain, shortness of breath, lower extremity edema, fatigue, palpitations, melena, hematuria, hemoptysis, diaphoresis, weakness, presyncope, syncope, orthopnea, and PND.    Home Medications    Prior to Admission medications   Medication Sig Start Date End Date Taking? Authorizing Provider  acetaminophen (TYLENOL) 500 MG tablet Take 500 mg by mouth daily as needed (muscle pain).    [provider]  alendronate (FOSAMAX) 70 MG tablet Take 1 tablet (70 mg total) by mouth once a week. Take with a full glass of water on an empty  stomach. 06/02/22   Lind Covert, MD  amLODipine (NORVASC) 10 MG tablet Take 1 tablet (10 mg total) by mouth daily. 06/13/22   Shelly Coss, MD  apixaban (ELIQUIS) 5 MG TABS tablet Take 1 tablet (5 mg total) by mouth 2 (two) times daily. 06/12/22   Shelly Coss, MD  atorvastatin (LIPITOR) 40 MG tablet Take 1 tablet (40 mg total) by mouth daily. 06/02/22   Lind Covert, MD  calcium carbonate (OSCAL) 1500 (600 Ca) MG TABS tablet Take by mouth 2 (two) times daily with a meal.    [provider]  Cyanocobalamin (VITAMIN B-12 PO) Take 1 capsule by mouth daily.    [provider]  ibuprofen (ADVIL,MOTRIN) 800 MG tablet Take 800 mg by mouth daily as needed for mild pain or moderate pain.    [provider]  losartan (COZAAR) 100 MG tablet Take 1 tablet (100 mg total) by mouth daily. Patient taking differently: Take 100 mg by mouth at bedtime. 05/26/22   Lind Covert, MD  Magnesium 250 MG TABS Take 1 tablet by mouth daily.    [provider]  MULTIPLE VITAMIN PO Take 1 tablet by mouth daily.    [provider]  POTASSIUM PO Take 1 tablet by mouth daily.    [provider]  sildenafil (VIAGRA) 50 MG tablet Take 2 tablets (100 mg total) by mouth daily as needed. Patient taking differently: Take 100 mg by mouth daily as needed for erectile dysfunction (prevent palpitations). 05/26/22   Lind Covert, MD  tamsulosin (FLOMAX) 0.4 MG CAPS capsule Take 1 capsule (0.4 mg total) by mouth daily. 06/13/22   Shelly Coss, MD  Zoster Vaccine Adjuvanted [place] Day) injection Inject 0.5 mLs into the muscle once, repeat in 2 months 05/26/22   Chambliss, Jeb Levering, MD    Family History    Family History  Problem Relation Age of Onset   Dementia Mother    Heart disease Father    Diabetes Father    Other Sister        BRIP1 gene mutation   Kidney disease Brother    Colon cancer Maternal Uncle    Lung cancer Paternal Aunt         d. >50   Cancer Paternal Aunt        unknown type, d. >50   Cancer Cousin 42       gynecologic (paternal first cousin)   Colon cancer Nephew 41       arising in colon polyp   Cancer Niece 58       gynecologic; niece in her 42's   Breast cancer Niece 108   Other Niece        BRIP1 gene mutation   Cancer Paternal Great-grandmother        abdominal cancer (PGF's mother) great grandmother   He indicated that his mother  is alive. He indicated that his father is deceased. He indicated that both of his sisters are alive. He indicated that only one of his two brothers is alive. He indicated that his maternal grandmother is deceased. He indicated that his maternal grandfather is deceased. He indicated that his paternal grandmother is deceased. He indicated that his paternal grandfather is deceased. He indicated that his daughter is alive. He indicated that both of his sons are deceased. He indicated that his maternal aunt is deceased. He indicated that both of his maternal uncles are deceased. He indicated that all of his three paternal aunts are deceased. He indicated that his paternal uncle is deceased. He indicated that his cousin is deceased. He indicated that his nephew is alive. He indicated that only one of his two nieces is alive. He indicated that his paternal great-grandmother is deceased.  Social History    Social History   Socioeconomic History   Marital status: Divorced    Spouse name: Not on file   Number of children: 3   Years of education: 12   Highest education level: GED or equivalent  Occupational History   Not on file  Tobacco Use   Smoking status: Never   Smokeless tobacco: Never  Substance and Sexual Activity   Alcohol use: No   Drug use: No   Sexual activity: Yes  Other Topics Concern   Not on file  Social History Narrative   Patient lives alone in [place] in a one story home. There are no steps.    Patient enjoys going to the gym, watching TV, and going to  antique shops.    Patient is divorced with 3 children, two of which have passed; does have a girlfriend.    Patient has his own car and is able to drive.    Social Determinants of Health   Financial Resource Strain: Low Risk  (05/28/2020)   Overall Financial Resource Strain (CARDIA)    Difficulty of Paying Living Expenses: Not hard at all  Food Insecurity: No Food Insecurity (05/28/2020)   Hunger Vital Sign    Worried About Running Out of Food in the Last Year: Never true    Ran Out of Food in the Last Year: Never true  Transportation Needs: No Transportation Needs (05/28/2020)   PRAPARE - Hydrologist (Medical): No    Lack of Transportation (Non-Medical): No  Physical Activity: Sufficiently Active (05/28/2020)   Exercise Vital Sign    Days of Exercise per Week: 4 days    Minutes of Exercise per Session: 120 min  Stress: No Stress Concern Present (05/28/2020)   [place]    Feeling of Stress : Only a little  Social Connections: Socially Isolated (05/28/2020)   Social Connection and Isolation Panel [NHANES]    Frequency of Communication with Friends and Family: More than three times a week    Frequency of Social Gatherings with Friends and Family: More than three times a week    Attends Religious Services: Never    Marine scientist or Organizations: No    Attends Archivist Meetings: Never    Marital Status: Divorced  Human resources officer Violence: Not At Risk (05/28/2020)   Humiliation, Afraid, Rape, and Kick questionnaire    Fear of Current or Ex-Partner: No    Emotionally Abused: No    Physically Abused: No    Sexually Abused: No     Review of  Systems    General:  No chills, fever, night sweats or weight changes.  Cardiovascular:  No chest pain, dyspnea on exertion, edema, orthopnea, palpitations, paroxysmal nocturnal dyspnea. Dermatological: No rash,  lesions/masses Respiratory: No cough, dyspnea Urologic: No hematuria, dysuria Abdominal:   No nausea, vomiting, diarrhea, bright red blood per rectum, melena, or hematemesis Neurologic:  No visual changes, wkns, changes in mental status. All other systems reviewed and are otherwise negative except as noted above.  Physical Exam    VS:  BP 116/74 (BP Location: Left Arm, Patient Position: Sitting, Cuff Size: Normal)   Pulse 69   Ht _0  (1.727 m)   Wt 164 lb 12.8 oz (74.8 kg)   SpO2 97%   BMI 25.06 kg/m  , BMI Body mass index is 25.06 kg/m. GEN: Well nourished, well developed, in no acute distress. HEENT: normal. Neck: Supple, no JVD, carotid bruits, or masses. Cardiac: RRR, no murmurs, rubs, or gallops. No clubbing, cyanosis, edema.  Radials/DP/PT 2+ and equal bilaterally.  Respiratory:  Respirations regular and unlabored, clear to auscultation bilaterally. GI: Soft, nontender, nondistended, BS + x 4. MS: no deformity or atrophy. Skin: warm and dry, no rash. Neuro:  Strength and sensation are intact. Psych: Normal affect.  Accessory Clinical Findings    Recent Labs: 06/11/2022: ALT 24; BUN 19; Creatinine, Ser 0.93; Hemoglobin 15.3; Platelets 235; Potassium 3.9; Sodium 141; TSH 2.199   Recent Lipid Panel    Component Value Date/Time   CHOL 130 05/26/2022 1038   TRIG 68 05/26/2022 1038   HDL 50 05/26/2022 1038   CHOLHDL 2.6 05/26/2022 1038   CHOLHDL 4.1 12/06/2013 1112   VLDL 14 12/06/2013 1112   LDLCALC 66 05/26/2022 1038    ECG personally reviewed by me today- none today  Live cardiac event monitor 06/12/2022-not yet resulted.  Echocardiogram 06/11/22 IMPRESSIONS     1. 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 Left  ventricular ejection fraction, by   estimation, is 60 to 65%. The left ventricle  has normal function. The  left ventricle has no regional wall motion abnormalities. There is mild  concentric left ventricular hypertrophy. Left ventricular diastolic  parameters are indeterminate.   2. Right ventricular systolic function is normal. The right ventricular  size is normal. There is normal pulmonary artery systolic pressure.   3. Left atrial size was severely dilated.   4. Right atrial size was severely dilated.   5. The mitral valve is normal in structure. No evidence of mitral valve  regurgitation. No evidence of mitral stenosis.   6. Tricuspid valve regurgitation is mild to moderate.   7. The aortic valve is normal in structure. Aortic valve regurgitation is  trivial. Aortic valve sclerosis is present, with no evidence of aortic  valve stenosis.   8. The inferior vena cava is normal in size with greater than 50%  respiratory variability, suggesting right atrial pressure of 3 mmHg.   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 Recommend Cardiac MR.  Assessment & Plan   1.  Near syncope-reports 1 episode of slight dizziness, comes out walking. Cardiac event monitor still on patient  and he plans to mail it in today.   Heart healthy low-sodium diet Increase p.o. hydration Change positions slowly  Atrial fibrillation-CHA2DS2-VASc score 2.  Reports compliance with apixaban.  Denies bleeding issues.  Heart rate  today 69. Continue current medical therapy  Essential hypertension-BP today 116/74.  Well-controlled at home. Continue losartan, amlodipine Heart healthy low-sodium diet-salty 6 given Increase physical activity as tolerated  Obstructive sleep apnea-intolerant of CPAP. Avoid sleep aids, sedatives, EtOH Sleep hygiene instructions given Follows with PCP  Disposition: Follow-up with Almyra Deforest PA-C or Dr. Sallyanne Kuster in 3-4 months.   Jossie Ng. Nick Stults NP-C     06/26/2022, 9:08 AM [place] [address] Office [phone] Fax [phone]  Notice: This dictation was prepared with Dragon dictation along with smaller phrase technology. Any transcriptional errors that result from this process are unintentional and may not be corrected upon review.  I spent 14 minutes examining this patient, reviewing medications, and using patient centered shared decision making involving her cardiac care.  Prior to her visit I spent greater than 20 minutes reviewing her past medical history,  medications, and prior cardiac tests.

## 2022-06-25 NOTE — Telephone Encounter (Signed)
Pharmacy Transitions of Care Follow-up Telephone Call  Date of discharge: 06/12/22  Discharge Diagnosis: afib  How have you been since you were released from the hospital? Patient is doing well and has no questions or concerns about his medication. Fills with [facility] and will follow up with them with any questions that do arise.    Medication changes made at discharge: START taking these medications  START taking these medications  Eliquis 5 MG Tabs tablet Generic drug: apixaban Take 1 tablet (5 mg total) by mouth 2 (two) times daily.  tamsulosin 0.4 MG Caps capsule Commonly known as: FLOMAX Take 1 capsule (0.4 mg total) by mouth daily.   CHANGE how you take these medications  CHANGE how you take these medications  amLODipine 10 MG tablet Commonly known as: NORVASC Take 1 tablet (10 mg total) by mouth daily. What changed:  medication strength how much to take  losartan 100 MG tablet Commonly known as: COZAAR Take 1 tablet (100 mg total) by mouth daily. What changed: when to take this  sildenafil 50 MG tablet Commonly known as: VIAGRA Take 2 tablets (100 mg total) by mouth daily as needed. What changed: reasons to take this   CONTINUE taking these medications  CONTINUE taking these medications  acetaminophen 500 MG tablet Commonly known as: TYLENOL  alendronate 70 MG tablet Commonly known as: FOSAMAX Take 1 tablet (70 mg total) by mouth once a week. Take with a full glass of water on an empty stomach.  atorvastatin 40 MG tablet Commonly known as: LIPITOR Take 1 tablet (40 mg total) by mouth daily.  calcium carbonate 1500 (600 Ca) MG Tabs tablet Commonly known as: OSCAL  ibuprofen 800 MG tablet Commonly known as: ADVIL  Magnesium 250 MG Tabs  MULTIPLE VITAMIN PO  POTASSIUM PO  Shingrix injection Generic drug: Zoster Vaccine Adjuvanted Inject 0.5 mLs into the muscle once, repeat in 2 months  VITAMIN B-12 PO   STOP taking these medications  STOP taking these  medications  aspirin 81 MG tablet    Medication changes verified by the patient? yes    Medication Accessibility:  [facility]   Was the patient provided with refills on discharged medications? no   Have all prescriptions been transferred from [facility] to home pharmacy? N/a   Is the patient interested in using a [place] pharmacy? yes  Is the patient able to afford medications? N/a  Referred patient to patient care advocate for medication assistance? no  Medication Review:  APIXABAN (ELIQUIS)  Apixaban 5 mg BID - Discussed importance of taking medication around the same time every day.  - Advised patient of medications to avoid (NSAIDs, ASA maintenance doses>100 mg daily)  - Educated that Tylenol (acetaminophen) will be the preferred analgesic to prevent risk of bleeding. - Emphasized importance of monitoring for signs and symptoms of bleeding (abnormal bruising, prolonged bleeding, nose bleeds, bleeding from gums, discolored urine, black tarry stools)  - Advised patient to alert all providers of anticoagulation therapy prior to starting a new medication or having a procedure.   Follow-up Appointments:  Date Visit Type Length Department   06/26/2022  8:25 AM OFFICE VISIT 25 min [facility] [[id number]]  Patient Instructions:   Please arrive 15 minutes prior to your appointment. This will allow [place] to verify and update your medical record and ensure a full appointment for you within the time allotted.         06/30/2022  9:10 AM HOSPITAL FU 20 min  [facility] [[id number]]    If their condition worsens, is the pt aware to call PCP or go to the Emergency Dept.? yes  Final Patient Assessment: Patient is doing well and has no questions or concerns about his medication. Fills with [facility] and will follow up with them with any questions that do arise.    Jiles Crocker, PharmD Clinical Pharmacist [facility] 06/25/2022 3:23 PM

## 2022-06-26 ENCOUNTER — Ambulatory Visit (INDEPENDENT_AMBULATORY_CARE_PROVIDER_SITE_OTHER): Payer: No Typology Code available for payment source | Admitting: General Practice

## 2022-06-26 ENCOUNTER — Encounter: Payer: Self-pay | Admitting: General Practice

## 2022-06-26 VITALS — BP 116/74 | HR 69 | Ht 68.0 in | Wt 164.8 lb

## 2022-06-26 DIAGNOSIS — R55 Syncope and collapse: Secondary | ICD-10-CM | POA: Diagnosis not present

## 2022-06-26 DIAGNOSIS — G4733 Obstructive sleep apnea (adult) (pediatric): Secondary | ICD-10-CM | POA: Diagnosis not present

## 2022-06-26 DIAGNOSIS — I4821 Permanent atrial fibrillation: Secondary | ICD-10-CM | POA: Diagnosis not present

## 2022-06-26 DIAGNOSIS — I10 Essential (primary) hypertension: Secondary | ICD-10-CM | POA: Diagnosis not present

## 2022-06-26 NOTE — Patient Instructions (Signed)
Medication Instructions:  Your physician recommends that you continue on your current medications as directed. Please refer to the Current Medication list given to you today.  *If you need a refill on your cardiac medications before your next appointment, please call your pharmacy*  Lab Work: NONE ordered at this time of appointment   If you have labs (blood work) drawn today and your tests are completely normal, you will receive your results only by: MyChart Message (if you have MyChart) OR A paper copy in the mail If you have any lab test that is abnormal or we need to change your treatment, we will call you to review the results.  Testing/Procedures: NONE ordered at this time of appointment   Follow-Up: At [facility], you and your health needs are our priority.  As part of our continuing mission to provide you with exceptional heart care, we have created designated Provider Care Teams.  These Care Teams include your primary Cardiologist (physician) and Advanced Practice Providers (APPs -  Physician Assistants and Nurse Practitioners) who all work together to provide you with the care you need, when you need it.  Your next appointment:   3-4 month(s)  The format for your next appointment:   In Person  Provider:   Thurmon Fair, MD    Other Instructions Maintain physical activity as tolerated.  Our office will call you with the results of cardiac monitor   Important Information About Sugar

## 2022-06-30 ENCOUNTER — Ambulatory Visit (INDEPENDENT_AMBULATORY_CARE_PROVIDER_SITE_OTHER): Payer: No Typology Code available for payment source | Admitting: Family Medicine

## 2022-06-30 ENCOUNTER — Encounter: Payer: Self-pay | Admitting: Family Medicine

## 2022-06-30 DIAGNOSIS — I4821 Permanent atrial fibrillation: Secondary | ICD-10-CM

## 2022-06-30 DIAGNOSIS — I10 Essential (primary) hypertension: Secondary | ICD-10-CM

## 2022-06-30 DIAGNOSIS — M8080XA Other osteoporosis with current pathological fracture, unspecified site, initial encounter for fracture: Secondary | ICD-10-CM

## 2022-06-30 NOTE — Assessment & Plan Note (Signed)
Stable office and home blood pressure readings without symptoms of lightheadness or chest pain.  Continue current medications

## 2022-06-30 NOTE — Patient Instructions (Signed)
Good to see you today - Thank you for coming in  Things we discussed today:  Your goal blood pressure is less than 140/90.  Check your blood pressure several times a week.  If regularly higher than this please let me know - either with MyChart or leaving a phone message. Next visit please bring in your blood pressure cuff.    Or lightheadness or dizziness  Keep taking all medications as you are  I will contact you about the bone density after its done  Please always bring your medication bottles  Come back to see me in 6 months

## 2022-06-30 NOTE — Assessment & Plan Note (Signed)
Seems intermittent.  Awaiting zio patch report.  Continue current medications

## 2022-06-30 NOTE — Assessment & Plan Note (Signed)
Bone density scheduled for December 2023

## 2022-06-30 NOTE — Progress Notes (Addendum)
    SUBJECTIVE:   CHIEF COMPLAINT / HPI:   Near syncope:  Feels well no repeat episodes Drinking more water Taking all medications as per record Just sent in the Zio patch   Paroxysmal A-fib:  on Eliquis twice a day no aspirin No bleeding   Hypertension:  Taking medications as per list No lightheadness or chest pain with exercise His blood pressure cuff compared to office cuff reads about 5 mmhg higher  Osteoporosis Bone density test in December   LUTS Saw urology back on tamsulosin  OBJECTIVE:   BP 132/79   Pulse 81   Wt 164 lb 9.6 oz (74.7 kg)   SpO2 99%   BMI 25.03 kg/m   Heart - Regular rate and rhythm.  No murmurs, gallops or rubs.    Lungs:  Normal respiratory effort, chest expands symmetrically. Lungs are clear to auscultation, no crackles or wheezes. Extremities:  No cyanosis, edema, or deformity noted with good range of motion of all major joints.   Mobility:able to get up and down from exam table without assistance or distress   ASSESSMENT/PLAN:   Atrial fibrillation (H[facility]) Seems intermittent.  Awaiting zio patch report.  Continue current medications   Essential hypertension Stable office and home blood pressure readings without symptoms of lightheadness or chest pain.  Continue current medications   Osteoporosis Bone density scheduled for December 2023    Patient Instructions  Good to see you today - Thank you for coming in  Things we discussed today:  Your goal blood pressure is less than 140/90.  Check your blood pressure several times a week.  If regularly higher than this please let me know - either with MyChart or leaving a phone message. Next visit please bring in your blood pressure cuff.    Or lightheadness or dizziness  Keep taking all medications as you are  I will contact you about the bone density after its done  Please always bring your medication bottles  Come back to see me in 6 months    Carney Living, MD [facility]

## 2022-07-06 NOTE — Addendum Note (Signed)
Encounter addended by: Andee Lineman A on: 07/06/2022 8:35 AM  Actions taken: Imaging Exam ended

## 2022-07-12 ENCOUNTER — Other Ambulatory Visit (HOSPITAL_COMMUNITY): Payer: Self-pay

## 2022-07-15 ENCOUNTER — Other Ambulatory Visit (HOSPITAL_COMMUNITY): Payer: Self-pay

## 2022-07-23 ENCOUNTER — Telehealth: Payer: Self-pay | Admitting: Cardiovascular Disease

## 2022-07-23 ENCOUNTER — Other Ambulatory Visit (HOSPITAL_COMMUNITY): Payer: Self-pay

## 2022-07-23 MED ORDER — APIXABAN 5 MG PO TABS
5.0000 mg | ORAL_TABLET | Freq: Two times a day (BID) | ORAL | 6 refills | Status: DC
  Filled 2022-07-23 – 2022-07-27 (×2): qty 60, 30d supply, fill #0
  Filled 2022-08-25: qty 60, 30d supply, fill #1
  Filled 2022-09-20: qty 60, 30d supply, fill #2
  Filled 2022-10-25: qty 60, 30d supply, fill #3
  Filled 2022-11-27: qty 60, 30d supply, fill #4
  Filled 2022-11-27: qty 60, 30d supply, fill #0
  Filled 2022-12-24: qty 60, 30d supply, fill #1
  Filled 2023-01-26: qty 60, 30d supply, fill #2

## 2022-07-23 NOTE — Telephone Encounter (Signed)
Patient states he ran out 2 weeks ago and was awaiting refill from [facility] outpatient pharmacy. I sent refill in.

## 2022-07-23 NOTE — Telephone Encounter (Signed)
Pt c/o medication issue:  1. Name of Medication: apixaban (ELIQUIS) 5 MG TABS tablet  2. How are you currently taking this medication (dosage and times per day)? Not currently taking it  3. Are you having a reaction (difficulty breathing--STAT)? no  4. What is your medication issue? Patient states he has been out of the medication for 2 weeks and the prescription says not to stop it. He would like to know what he needs to do. He says it says he can take vitamin e to replace it in the mean time.

## 2022-07-24 ENCOUNTER — Other Ambulatory Visit (HOSPITAL_COMMUNITY): Payer: Self-pay

## 2022-07-27 ENCOUNTER — Other Ambulatory Visit (HOSPITAL_COMMUNITY): Payer: Self-pay

## 2022-08-08 ENCOUNTER — Emergency Department (HOSPITAL_COMMUNITY)
Admission: EM | Admit: 2022-08-08 | Discharge: 2022-08-09 | Disposition: A | Discharge status: Home / Self Care | Payer: No Typology Code available for payment source | Attending: Emergency Medicine | Admitting: Emergency Medicine

## 2022-08-08 ENCOUNTER — Other Ambulatory Visit: Payer: Self-pay

## 2022-08-08 ENCOUNTER — Emergency Department (HOSPITAL_COMMUNITY): Payer: No Typology Code available for payment source

## 2022-08-08 DIAGNOSIS — S61412A Laceration without foreign body of left hand, initial encounter: Secondary | ICD-10-CM | POA: Insufficient documentation

## 2022-08-08 DIAGNOSIS — S6992XA Unspecified injury of left wrist, hand and finger(s), initial encounter: Secondary | ICD-10-CM | POA: Diagnosis present

## 2022-08-08 DIAGNOSIS — M25512 Pain in left shoulder: Secondary | ICD-10-CM | POA: Diagnosis not present

## 2022-08-08 DIAGNOSIS — S299XXA Unspecified injury of thorax, initial encounter: Secondary | ICD-10-CM | POA: Diagnosis not present

## 2022-08-08 DIAGNOSIS — R0789 Other chest pain: Secondary | ICD-10-CM | POA: Diagnosis not present

## 2022-08-08 DIAGNOSIS — Z7901 Long term (current) use of anticoagulants: Secondary | ICD-10-CM | POA: Insufficient documentation

## 2022-08-08 DIAGNOSIS — I10 Essential (primary) hypertension: Secondary | ICD-10-CM | POA: Diagnosis not present

## 2022-08-08 DIAGNOSIS — Z79899 Other long term (current) drug therapy: Secondary | ICD-10-CM | POA: Diagnosis not present

## 2022-08-08 DIAGNOSIS — M79642 Pain in left hand: Secondary | ICD-10-CM | POA: Diagnosis not present

## 2022-08-08 DIAGNOSIS — R079 Chest pain, unspecified: Secondary | ICD-10-CM | POA: Diagnosis not present
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
## 2022-08-09 ENCOUNTER — Encounter (HOSPITAL_COMMUNITY): Payer: Self-pay

## 2022-08-09 ENCOUNTER — Emergency Department (HOSPITAL_COMMUNITY): Payer: No Typology Code available for payment source

## 2022-08-09 DIAGNOSIS — S299XXA Unspecified injury of thorax, initial encounter: Secondary | ICD-10-CM | POA: Diagnosis not present

## 2022-08-09 DIAGNOSIS — S61412A Laceration without foreign body of left hand, initial encounter: Secondary | ICD-10-CM | POA: Diagnosis not present

## 2022-08-09 LAB — BASIC METABOLIC PANEL
Anion gap: 3 — ABNORMAL LOW (ref 5–15)
BUN: 22 mg/dL (ref 8–23)
CO2: 26 mmol/L (ref 22–32)
Calcium: 8.8 mg/dL — ABNORMAL LOW (ref 8.9–10.3)
Chloride: 108 mmol/L (ref 98–111)
Creatinine, Ser: 1.12 mg/dL (ref 0.61–1.24)
GFR, Estimated: >60 mL/min (ref >60)
Glucose, Bld: 112 mg/dL — ABNORMAL HIGH (ref 70–99)
Potassium: 4.2 mmol/L (ref 3.5–5.1)
Sodium: 137 mmol/L (ref 135–145)

## 2022-08-09 LAB — CBC WITH DIFFERENTIAL/PLATELET
Abs Immature Granulocytes: 0.01 10*3/uL (ref 0.00–0.07)
Basophils Absolute: 0 10*3/uL (ref 0.0–0.1)
Basophils Relative: 0 %
Eosinophils Absolute: 0 10*3/uL (ref 0.0–0.5)
Eosinophils Relative: 1 %
HCT: 46.7 % (ref 39.0–52.0)
Hemoglobin: 15.7 g/dL (ref 13.0–17.0)
Immature Granulocytes: 0 %
Lymphocytes Relative: 16 %
Lymphs Abs: 1.2 10*3/uL (ref 0.7–4.0)
MCH: 31.1 pg (ref 26.0–34.0)
MCHC: 33.6 g/dL (ref 30.0–36.0)
MCV: 92.5 fL (ref 80.0–100.0)
Monocytes Absolute: 0.7 10*3/uL (ref 0.1–1.0)
Monocytes Relative: 10 %
Neutro Abs: 5.1 10*3/uL (ref 1.7–7.7)
Neutrophils Relative %: 73 %
Platelets: 219 10*3/uL (ref 150–400)
RBC: 5.05 MIL/uL (ref 4.22–5.81)
RDW: 13 % (ref 11.5–15.5)
WBC: 7 10*3/uL (ref 4.0–10.5)
nRBC: 0 % (ref 0.0–0.2)

## 2022-08-09 MED ORDER — IOHEXOL 350 MG/ML SOLN
50.0000 mL | Freq: Once | INTRAVENOUS | Status: AC | PRN
  Administered 2022-08-09: 50 mL via INTRAVENOUS

## 2022-08-09 MED ORDER — HYDROCODONE-ACETAMINOPHEN 5-325 MG PO TABS
1.0000 | ORAL_TABLET | Freq: Once | ORAL | Status: AC
  Administered 2022-08-09: 1 via ORAL
  Filled 2022-08-09: qty 1

## 2022-08-09 MED ORDER — HYDROCODONE-ACETAMINOPHEN 5-325 MG PO TABS: 1.0000 | ORAL_TABLET | Freq: Four times a day (QID) | ORAL | 0 refills | Status: DC | PRN

## 2022-08-09 MED ORDER — BACITRACIN ZINC 500 UNIT/GM EX OINT
TOPICAL_OINTMENT | Freq: Once | CUTANEOUS | Status: AC
Start: 2022-08-09 — End: 2022-08-09
  Administered 2022-08-09: 1 via TOPICAL
  Filled 2022-08-09: qty 0.9

## 2022-08-09 MED ORDER — CYCLOBENZAPRINE HCL 10 MG PO TABS: 10.0000 mg | ORAL_TABLET | Freq: Two times a day (BID) | ORAL | 0 refills | Status: DC | PRN
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
## 2022-08-10 ENCOUNTER — Other Ambulatory Visit (HOSPITAL_COMMUNITY): Payer: Self-pay

## 2022-08-17 ENCOUNTER — Encounter: Payer: Self-pay | Admitting: Family Medicine

## 2022-08-17 ENCOUNTER — Other Ambulatory Visit: Payer: Self-pay

## 2022-08-17 ENCOUNTER — Ambulatory Visit (INDEPENDENT_AMBULATORY_CARE_PROVIDER_SITE_OTHER): Payer: No Typology Code available for payment source | Admitting: Family Medicine

## 2022-08-17 DIAGNOSIS — R079 Chest pain, unspecified: Secondary | ICD-10-CM | POA: Insufficient documentation
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
## 2022-08-17 NOTE — Patient Instructions (Signed)
Good to see you today - Thank you for coming in  Things we discussed today:  Chest Pain - take tylenol xtra strength three times a day as needed  - Use the cyclobenzaprine at night as needed - If any bleeding or shortness of breath or fever let me know right away - Do not work until 10/15 then light duty for one week
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
## 2022-08-25 ENCOUNTER — Other Ambulatory Visit (HOSPITAL_COMMUNITY): Payer: Self-pay

## 2022-08-25 ENCOUNTER — Other Ambulatory Visit: Payer: Self-pay | Admitting: Family Medicine

## 2022-08-25 MED ORDER — ALENDRONATE SODIUM 70 MG PO TABS
70.0000 mg | ORAL_TABLET | ORAL | 0 refills | Status: DC
  Filled 2022-08-25: qty 8, 56d supply, fill #0

## 2022-08-27 ENCOUNTER — Encounter: Payer: Self-pay | Admitting: Family Medicine

## 2022-08-27 ENCOUNTER — Telehealth: Payer: Self-pay

## 2022-08-27 NOTE — Telephone Encounter (Signed)
See myc message
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
## 2022-09-01 ENCOUNTER — Other Ambulatory Visit (HOSPITAL_COMMUNITY): Payer: Self-pay

## 2022-09-04 ENCOUNTER — Other Ambulatory Visit (HOSPITAL_COMMUNITY): Payer: Self-pay
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
## 2022-09-09 ENCOUNTER — Other Ambulatory Visit: Payer: Self-pay

## 2022-09-09 ENCOUNTER — Encounter: Payer: Self-pay | Admitting: Family Medicine

## 2022-09-09 ENCOUNTER — Ambulatory Visit (INDEPENDENT_AMBULATORY_CARE_PROVIDER_SITE_OTHER): Payer: No Typology Code available for payment source | Admitting: Family Medicine

## 2022-09-09 DIAGNOSIS — R079 Chest pain, unspecified: Secondary | ICD-10-CM

## 2022-09-09 NOTE — Assessment & Plan Note (Signed)
Improving but not ready for full work duty.  Will extend light duty for 2 more weeks.  If not ready to return to full duty will start Physical Therapy Continue tylenol

## 2022-09-09 NOTE — Patient Instructions (Signed)
Good to see you today - Thank you for coming in  Things we discussed today:  Muscle Chest Strain Continue light duty for 2 more weeks If in a week you don't think you can go back to full duty then call me and I will refer to Physical Therapy

## 2022-09-15 ENCOUNTER — Other Ambulatory Visit (HOSPITAL_COMMUNITY): Payer: Self-pay

## 2022-09-20 ENCOUNTER — Other Ambulatory Visit: Payer: Self-pay | Admitting: Family Medicine

## 2022-09-20 ENCOUNTER — Other Ambulatory Visit (HOSPITAL_COMMUNITY): Payer: Self-pay

## 2022-09-21 ENCOUNTER — Other Ambulatory Visit (HOSPITAL_COMMUNITY): Payer: Self-pay

## 2022-09-21 MED ORDER — ALENDRONATE SODIUM 70 MG PO TABS
70.0000 mg | ORAL_TABLET | ORAL | 0 refills | Status: DC
  Filled 2022-09-21 – 2022-10-25 (×2): qty 8, 56d supply, fill #0

## 2022-09-22 ENCOUNTER — Other Ambulatory Visit (HOSPITAL_COMMUNITY): Payer: Self-pay

## 2022-09-22 ENCOUNTER — Ambulatory Visit: Payer: TRICARE For Life (TFL) | Attending: Cardiovascular Disease | Admitting: Cardiovascular Disease

## 2022-09-22 ENCOUNTER — Encounter: Payer: Self-pay | Admitting: Cardiovascular Disease

## 2022-09-22 VITALS — BP 132/72 | HR 72 | Ht 68.0 in | Wt 164.6 lb

## 2022-09-22 DIAGNOSIS — G4733 Obstructive sleep apnea (adult) (pediatric): Secondary | ICD-10-CM

## 2022-09-22 DIAGNOSIS — I422 Other hypertrophic cardiomyopathy: Secondary | ICD-10-CM

## 2022-09-22 DIAGNOSIS — I10 Essential (primary) hypertension: Secondary | ICD-10-CM

## 2022-09-22 DIAGNOSIS — I4821 Permanent atrial fibrillation: Secondary | ICD-10-CM | POA: Diagnosis not present

## 2022-09-22 DIAGNOSIS — R55 Syncope and collapse: Secondary | ICD-10-CM | POA: Diagnosis not present
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
## 2022-09-22 NOTE — Patient Instructions (Addendum)
Medication Instructions:  Continue same medications *If you need a refill on your cardiac medications before your next appointment, please call your pharmacy*   Lab Work: None ordered   Testing/Procedures: Loop extract and reimplant  Ashlynn will call with appointment    Follow-Up: At [facility], you and your health needs are our priority.  As part of our continuing mission to provide you with exceptional heart care, we have created designated Provider Care Teams.  These Care Teams include your primary Cardiologist (physician) and Advanced Practice Providers (APPs -  Physician Assistants and Nurse Practitioners) who all work together to provide you with the care you need, when you need it.  We recommend signing up for the patient portal called "MyChart".  Sign up information is provided on this After Visit Summary.  MyChart is used to connect with patients for Virtual Visits (Telemedicine).  Patients are able to view lab/test results, encounter notes, upcoming appointments, etc.  Non-urgent messages can be sent to your provider as well.   To learn more about what you can do with MyChart, go to [email or web address].    Your next appointment:  2 weeks you will get a call to schedule      The format for your next appointment: Video visit 2 weeks after Loop implant   Provider:  Dr.Croitoru  Schedule 6 month follow up appointment  Call in Feb to schedule May appointment     Important Information About Sugar

## 2022-09-23 ENCOUNTER — Encounter: Payer: Self-pay | Admitting: Cardiovascular Disease

## 2022-09-23 NOTE — Progress Notes (Signed)
Saw him yesterday. Can you please order an itamar sleep study for him? Stop Wayne Molina is 3

## 2022-09-29 ENCOUNTER — Telehealth: Payer: Self-pay

## 2022-09-29 NOTE — Telephone Encounter (Signed)
Well. Ok then

## 2022-09-29 NOTE — Telephone Encounter (Signed)
Late Entry  Spoke with patient about Itamar sleep study- patient states that he is not interested in any sleep study at this time. Patient states that he does not want the device that is implanted for sleep apnea therefore no need to proceed with any testing. Advised patient to call back if he decides that he would like to pursue and we can arrange. Patient aware and verbalized understanding.  Will forward to MD to make aware.

## 2022-10-02 ENCOUNTER — Other Ambulatory Visit (HOSPITAL_COMMUNITY): Payer: Self-pay

## 2022-10-25 ENCOUNTER — Other Ambulatory Visit: Payer: Self-pay | Admitting: Family Medicine

## 2022-10-26 ENCOUNTER — Encounter: Payer: No Typology Code available for payment source | Admitting: Cardiovascular Disease

## 2022-10-26 ENCOUNTER — Other Ambulatory Visit: Payer: Self-pay

## 2022-11-06 ENCOUNTER — Ambulatory Visit
Admission: RE | Admit: 2022-11-06 | Discharge: 2022-11-06 | Disposition: A | Discharge status: Home / Self Care | Payer: TRICARE For Life (TFL) | Source: Ambulatory Visit | Attending: Family Medicine | Admitting: Family Medicine

## 2022-11-06 DIAGNOSIS — M8080XA Other osteoporosis with current pathological fracture, unspecified site, initial encounter for fracture: Secondary | ICD-10-CM

## 2022-11-06 DIAGNOSIS — M8589 Other specified disorders of bone density and structure, multiple sites: Secondary | ICD-10-CM | POA: Diagnosis not present

## 2022-11-16 ENCOUNTER — Other Ambulatory Visit (HOSPITAL_COMMUNITY): Payer: Self-pay

## 2022-11-16 ENCOUNTER — Other Ambulatory Visit: Payer: Self-pay | Admitting: Family Medicine

## 2022-11-17 ENCOUNTER — Other Ambulatory Visit (HOSPITAL_COMMUNITY): Payer: Self-pay

## 2022-11-17 MED ORDER — AMLODIPINE BESYLATE 10 MG PO TABS
10.0000 mg | ORAL_TABLET | Freq: Every day | ORAL | 0 refills | Status: DC
  Filled 2022-11-17: qty 90, 90d supply, fill #0

## 2022-11-19 ENCOUNTER — Ambulatory Visit
Payer: No Typology Code available for payment source | Attending: Cardiovascular Disease | Admitting: Cardiovascular Disease

## 2022-11-19 DIAGNOSIS — G4733 Obstructive sleep apnea (adult) (pediatric): Secondary | ICD-10-CM

## 2022-11-19 DIAGNOSIS — Z4509 Encounter for adjustment and management of other cardiac device: Secondary | ICD-10-CM

## 2022-11-19 DIAGNOSIS — Z95818 Presence of other cardiac implants and grafts: Secondary | ICD-10-CM

## 2022-11-19 DIAGNOSIS — R55 Syncope and collapse: Secondary | ICD-10-CM

## 2022-11-19 MED ORDER — LIDOCAINE-EPINEPHRINE 1 %-1:100000 IJ SOLN
20.0000 mL | Freq: Once | INTRAMUSCULAR | Status: AC
  Administered 2022-11-19: 15 mL

## 2022-11-19 NOTE — Patient Instructions (Signed)
Discharge Instructions for  Loop Recorder Explant/Implant    Follow up: Keep your wound check appointment on 12/02/22 at 10:20 AM (this is a virtual visit).  If you have any questions or concerns, please call the office at [phone].  ACTIVITY No restrictions. DO wear your seatbelt, even if it crosses over the site.   WOUND CARE Keep the wound area clean and dry.  Remove the dressing the day after (usually 24 hours after the procedure). DO NOT SUBMERGE UNDER WATER UNTIL FULLY HEALED (no tub baths, hot tubs, swimming pools, etc.).  You  may shower or take a sponge bath after the dressing is removed. DO NOT SOAK the area and do not allow the shower to directly spray on the site. If you have tape/steri-strips on your wound, these will fall off; do not pull them off prematurely.   No bandage is needed on the site.  DO  NOT apply any creams, oils, or ointments to the wound area. If you notice any drainage or discharge from the wound, any swelling, excessive redness or bruising at the site, or if you develop a fever > 101? F, call the office at once at [phone].

## 2022-11-19 NOTE — Progress Notes (Signed)
Wayne Molina is here for extraction of old loop recorder (end of battery service) and implantation of a new loop recorder due to syncope. This procedure has been fully reviewed with the patient and written informed consent has been obtained.   LOOP RECORDER IMPLANT   Procedure report  Procedure performed:  Loop recorder implantation   Reason for procedure:  Recurrent syncope/near-syncope  Procedure performed by:  Sanda Klein, MD  Complications:  None  Estimated blood loss:  <5 mL  Medications administered during procedure:  Lidocaine 1% with 1/100,000 epinephrine 15 mL locally Device details:  Medtronic Reveal Linq model number [id number], serial number [id number] Procedure details:  After the risks and benefits of the procedure were discussed the patient provided informed consent. The patient was prepped and draped in usual sterile fashion. Local anesthesia was administered to an area 2 cm to the left of the sternum in the 4th intercostal space. A cutaneous incision was made using a scalpel. The old device was extracted with a hemostat. The introducer was then used to create a subcutaneous tunnel and carefully deploy the newdevice. Local pressure was held to ensure hemostasis.  The incision was closed with SteriStrips and a sterile dressing was applied.  R waves 0.6 mV.  Sanda Klein, MD, [facility] [phone] office [phone] pager 11/19/2022 12:58 PM

## 2022-11-23 ENCOUNTER — Encounter: Payer: Self-pay | Admitting: Family Medicine

## 2022-11-27 ENCOUNTER — Ambulatory Visit
Admission: EM | Admit: 2022-11-27 | Discharge: 2022-11-27 | Disposition: A | Discharge status: Home / Self Care | Payer: No Typology Code available for payment source | Attending: Urgent Care | Admitting: Urgent Care

## 2022-11-27 ENCOUNTER — Other Ambulatory Visit (HOSPITAL_COMMUNITY): Payer: Self-pay

## 2022-11-27 ENCOUNTER — Ambulatory Visit (INDEPENDENT_AMBULATORY_CARE_PROVIDER_SITE_OTHER): Payer: No Typology Code available for payment source

## 2022-11-27 DIAGNOSIS — M79671 Pain in right foot: Secondary | ICD-10-CM | POA: Diagnosis not present

## 2022-11-27 MED ORDER — ACETAMINOPHEN 325 MG PO TABS
650.0000 mg | ORAL_TABLET | Freq: Four times a day (QID) | ORAL | 0 refills | Status: DC | PRN
  Filled 2022-11-27: qty 30, 4d supply, fill #0

## 2022-11-27 NOTE — ED Triage Notes (Signed)
Pt c/o pain to right foot x 3 weeks-denies known injury-NAD-limping gait

## 2022-11-27 NOTE — ED Provider Notes (Signed)
[facility]  Note:  This document was prepared using Systems analyst and may include unintentional dictation errors.  MRN: [id number] DOB: Jun 06, 1956  Subjective:   Wayne Molina is a 67 y.o. male presenting for 3-week history of persistent lateral right foot pain.  No fall, trauma, numbness or tingling, swelling, warmth, wounds.  Patient has a history of a fracture of the left foot was considered to be a stress fracture.  It was not seen on x-rays but ended up getting an MRI and was found then.  Has been wearing his boot from that fracture for this right foot which helps with some of the pain.  No history of gout.  No current facility-administered medications for this encounter.  Current Outpatient Medications:    acetaminophen (TYLENOL) 500 MG tablet, Take 500 mg by mouth daily as needed (muscle pain)., Disp: , Rfl:    amLODipine (NORVASC) 10 MG tablet, Take 1 tablet (10 mg total) by mouth daily., Disp: 90 tablet, Rfl: 0   apixaban (ELIQUIS) 5 MG TABS tablet, Take 1 tablet (5 mg total) by mouth 2 (two) times daily., Disp: 60 tablet, Rfl: 6   atorvastatin (LIPITOR) 40 MG tablet, Take 1 tablet (40 mg total) by mouth daily., Disp: 90 tablet, Rfl: 3   calcium carbonate (OSCAL) 1500 (600 Ca) MG TABS tablet, Take by mouth 2 (two) times daily with a meal., Disp: , Rfl:    Cyanocobalamin (VITAMIN B-12 PO), Take 1 capsule by mouth daily., Disp: , Rfl:    losartan (COZAAR) 100 MG tablet, Take 1 tablet (100 mg total) by mouth daily. (Patient taking differently: Take 100 mg by mouth at bedtime.), Disp: 90 tablet, Rfl: 1   Magnesium 250 MG TABS, Take 1 tablet by mouth daily., Disp: , Rfl:    Magnesium Oxide 250 MG TABS, Take 1 tablet by mouth daily in the afternoon., Disp: , Rfl:    Multiple Vitamin (MULTIVITAMIN ADULT PO), Take 1 tablet by mouth daily., Disp: , Rfl:    MULTIPLE VITAMIN PO, Take 1 tablet by mouth daily., Disp: , Rfl:    Potassium Gluconate 2  MEQ TABS, Take 1 tablet by mouth daily in the afternoon., Disp: , Rfl:    POTASSIUM PO, Take 1 tablet by mouth daily., Disp: , Rfl:    sildenafil (VIAGRA) 50 MG tablet, Take 2 tablets (100 mg total) by mouth daily as needed. (Patient not taking: Reported on 09/22/2022), Disp: 10 tablet, Rfl: 2   No Known Allergies  Past Medical History:  Diagnosis Date   Anxiety    Arthritis    fingers   Chronic atrial fibrillation (HCC)    Depression    Family history of breast cancer    Family history of cancer of male genital organ    Family history of gene mutation    BRIP1   Family history of malignant neoplasm of gastrointestinal tract    Hyperlipidemia    Hypertension    Personal history of colonic polyps 11/12/2006   Sleep apnea      Past Surgical History:  Procedure Laterality Date   2-D echocardiogram  07/22/2010   Ejection fraction greater than 55%. Left atrium moderately dilated. Right atrium moderately dilated. 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 Mild to Moderate MR. Mild to moderate TR.   ATRIAL ABLATION SURGERY  2004   EP IMPLANTABLE DEVICE N/A 02/11/2016   Procedure: Loop Recorder Insertion;  Surgeon: Sanda Klein, MD;  Location: [facility];  Service:  Cardiovascular;  Laterality: N/A;   Exercise Myoview stress test  07/08/2000   Nonischemic low-risk.   HIP FRACTURE SURGERY  1970s   IR GENERIC HISTORICAL  01/21/2017   IR RADIOLOGIST EVAL & MGMT 01/21/2017 [facility]    Family History  Problem Relation Age of Onset   Dementia Mother    Heart disease Father    Diabetes Father    Other Sister        BRIP1 gene mutation   Kidney disease Brother    Colon cancer Maternal Uncle    Lung cancer Paternal Aunt        d. >50   Cancer Paternal Aunt        unknown type, d. >50   Cancer Cousin 17       gynecologic (paternal first cousin)   Colon cancer Nephew 27       arising in colon polyp   Cancer Niece 42       gynecologic; niece in her 59's   Breast cancer Niece 79    Other Niece        BRIP1 gene mutation   Cancer Paternal Great-grandmother        abdominal cancer (PGF's mother) great grandmother    Social History   Tobacco Use   Smoking status: Never   Smokeless tobacco: Never  Vaping Use   Vaping Use: Never used  Substance Use Topics   Alcohol use: No   Drug use: No    ROS   Objective:   Vitals: BP (!) 172/100 (BP Location: Right Arm)   Pulse 82   Temp 97.9 F (36.6 C) (Oral)   Resp 16   SpO2 97%   Physical Exam Constitutional:      General: He is not in acute distress.    Appearance: Normal appearance. He is well-developed and normal weight. He is not ill-appearing, toxic-appearing or diaphoretic.  HENT:     Head: Normocephalic and atraumatic.     Right Ear: External ear normal.     Left Ear: External ear normal.     Nose: Nose normal.     Mouth/Throat:     Pharynx: Oropharynx is clear.  Eyes:     General: No scleral icterus.       Right eye: No discharge.        Left eye: No discharge.     Extraocular Movements: Extraocular movements intact.  Cardiovascular:     Rate and Rhythm: Normal rate.  Pulmonary:     Effort: Pulmonary effort is normal.  Musculoskeletal:     Cervical back: Normal range of motion.       Feet:  Neurological:     Mental Status: He is alert and oriented to person, place, and time.  Psychiatric:        Mood and Affect: Mood normal.        Behavior: Behavior normal.        Thought Content: Thought content normal.        Judgment: Judgment normal.    DG Foot Complete Right  Result Date: 11/27/2022 CLINICAL DATA:  Right foot pain for 3 weeks.  No known injury. EXAM: RIGHT FOOT COMPLETE - 3+ VIEW COMPARISON:  None Available. FINDINGS: There is no evidence of fracture or dislocation. Plantar heel spur. Mild hallux valgus deformity. No significant arthropathy. Soft tissues are unremarkable. IMPRESSION: 1. No acute findings. 2. Plantar heel spur. Electronically Signed   By: Signa Kell M.D.   On:  11/27/2022  08:37     Assessment and Plan :   PDMP not reviewed this encounter.  1. Right foot pain     Given his history of previously having had a left foot stress fracture that did not show up on x-ray recommended consultation with podiatry for consideration a CT scan and/or an MRI.  In the meantime, recommend rest whenever possible, Tylenol for pain. Counseled patient on potential for adverse effects with medications prescribed/recommended today, ER and return-to-clinic precautions discussed, patient verbalized understanding.    Jaynee Eagles, [place] 11/27/22 [phone]

## 2022-12-02 ENCOUNTER — Ambulatory Visit
Payer: No Typology Code available for payment source | Attending: Cardiovascular Disease | Admitting: Cardiovascular Disease

## 2022-12-02 DIAGNOSIS — Z95818 Presence of other cardiac implants and grafts: Secondary | ICD-10-CM

## 2022-12-02 NOTE — Progress Notes (Signed)
MyChart video wound at visit shows well sealed/healed implantable loop recorder site without any evidence of redness or swelling.

## 2022-12-03 ENCOUNTER — Ambulatory Visit (INDEPENDENT_AMBULATORY_CARE_PROVIDER_SITE_OTHER): Payer: No Typology Code available for payment source

## 2022-12-03 ENCOUNTER — Other Ambulatory Visit: Payer: Self-pay | Admitting: Podiatry

## 2022-12-03 ENCOUNTER — Ambulatory Visit (INDEPENDENT_AMBULATORY_CARE_PROVIDER_SITE_OTHER): Payer: No Typology Code available for payment source | Admitting: Podiatry

## 2022-12-03 DIAGNOSIS — M79671 Pain in right foot: Secondary | ICD-10-CM

## 2022-12-03 DIAGNOSIS — M778 Other enthesopathies, not elsewhere classified: Secondary | ICD-10-CM

## 2022-12-03 DIAGNOSIS — M19071 Primary osteoarthritis, right ankle and foot: Secondary | ICD-10-CM | POA: Diagnosis not present

## 2022-12-03 NOTE — Progress Notes (Signed)
Subjective:  Patient ID: Wayne Molina, male    DOB: 12-05-1955,  MRN: [id number]  Chief Complaint  Patient presents with   Foot Pain    Right foot pain under the toes for about 3 weeks     67 y.o. male presents with the above complaint.  Patient presents with new complaint of right lateral foot pain midfoot pain.  Patient states been going for about 3 weeks has progressive gotten worse worse with ambulation worse with pressure.  He had similar situation on the left side which is doing great after steroid injection.  He would like to do the right side.  Denies any other acute complaints.  Hurts with ambulation worse with pressure   Review of Systems: Negative except as noted in the HPI. Denies N/V/F/Ch.  Past Medical History:  Diagnosis Date   Anxiety    Arthritis    fingers   Chronic atrial fibrillation ([place])    Depression    Family history of breast cancer    Family history of cancer of male genital organ    Family history of gene mutation    BRIP1   Family history of malignant neoplasm of gastrointestinal tract    Hyperlipidemia    Hypertension    Personal history of colonic polyps 11/12/2006   Sleep apnea     Current Outpatient Medications:    acetaminophen (TYLENOL) 325 MG tablet, Take 2 tablets (650 mg total) by mouth every 6 (six) hours as needed for moderate pain., Disp: 30 tablet, Rfl: 0   amLODipine (NORVASC) 10 MG tablet, Take 1 tablet (10 mg total) by mouth daily., Disp: 90 tablet, Rfl: 0   apixaban (ELIQUIS) 5 MG TABS tablet, Take 1 tablet (5 mg total) by mouth 2 (two) times daily., Disp: 60 tablet, Rfl: 6   atorvastatin (LIPITOR) 40 MG tablet, Take 1 tablet (40 mg total) by mouth daily., Disp: 90 tablet, Rfl: 3   calcium carbonate (OSCAL) 1500 (600 Ca) MG TABS tablet, Take by mouth 2 (two) times daily with a meal., Disp: , Rfl:    Cyanocobalamin (VITAMIN B-12 PO), Take 1 capsule by mouth daily., Disp: , Rfl:    losartan (COZAAR) 100 MG tablet, Take 1 tablet (100  mg total) by mouth daily., Disp: 90 tablet, Rfl: 1   Magnesium 250 MG TABS, Take 1 tablet by mouth daily., Disp: , Rfl:    Magnesium Oxide 250 MG TABS, Take 1 tablet by mouth daily in the afternoon., Disp: , Rfl:    Multiple Vitamin (MULTIVITAMIN ADULT PO), Take 1 tablet by mouth daily., Disp: , Rfl:    MULTIPLE VITAMIN PO, Take 1 tablet by mouth daily., Disp: , Rfl:    Potassium Gluconate 2 MEQ TABS, Take 1 tablet by mouth daily in the afternoon., Disp: , Rfl:    POTASSIUM PO, Take 1 tablet by mouth daily., Disp: , Rfl:    sildenafil (VIAGRA) 50 MG tablet, Take 2 tablets (100 mg total) by mouth daily as needed., Disp: 10 tablet, Rfl: 2  Social History   Tobacco Use  Smoking Status Never  Smokeless Tobacco Never    No Known Allergies Objective:  There were no vitals filed for this visit. There is no height or weight on file to calculate BMI. Constitutional Well developed. Well nourished.  Vascular Dorsalis pedis pulses palpable bilaterally. Posterior tibial pulses palpable bilaterally. Capillary refill normal to all digits.  No cyanosis or clubbing noted. Pedal hair growth normal.  Neurologic Normal speech. Oriented to person,  place, and time. Epicritic sensation to light touch grossly present bilaterally.  Dermatologic Nails well groomed and normal in appearance. No open wounds. No skin lesions.  Orthopedic: Pain on palpation to the right lateral midfoot.  Underlying clinically arthritis appreciated.  No pain at the extensor tendon or Lisfranc interval   Radiographs: 3 views of skeletally mature the right foot: Midfoot arthritis noted pes planovalgus foot structure noted posterior heel spurring noted.  No other bony abnormalities identified.  Mild bunion deformity noted Assessment:   1. Capsulitis of right foot   2. Arthritis of right midfoot    Plan:  Patient was evaluated and treated and all questions answered.  Right foot capsulitis with underlying midfoot  arthritis -All questions or concerns were discussed with the patient in extensive detail given the amount of pain that he is having he will benefit from a steroid injection help decrease acute inflammatory component associate with pain.  Patient agrees with plan like to proceed with steroid injection -A steroid injection was performed at right midfoot at point of maximal tenderness using 1% plain Lidocaine and 10 mg of Kenalog. This was well tolerated. -I discussed shoe gear modification as well.    No follow-ups on file.

## 2022-12-05 ENCOUNTER — Other Ambulatory Visit (HOSPITAL_COMMUNITY): Payer: Self-pay

## 2022-12-05 ENCOUNTER — Encounter: Payer: Self-pay | Admitting: Emergency Medicine

## 2022-12-05 ENCOUNTER — Ambulatory Visit
Admission: EM | Admit: 2022-12-05 | Discharge: 2022-12-05 | Disposition: A | Discharge status: Home / Self Care | Payer: No Typology Code available for payment source | Attending: Nurse Practitioner | Admitting: Nurse Practitioner

## 2022-12-05 ENCOUNTER — Telehealth: Payer: Self-pay

## 2022-12-05 DIAGNOSIS — M79671 Pain in right foot: Secondary | ICD-10-CM

## 2022-12-05 MED ORDER — PREDNISONE 20 MG PO TABS: 40.0000 mg | ORAL_TABLET | Freq: Every day | ORAL | 0 refills | Status: AC

## 2022-12-05 MED ORDER — PREDNISONE 20 MG PO TABS
40.0000 mg | ORAL_TABLET | Freq: Every day | ORAL | 0 refills | Status: DC
  Filled 2022-12-05: qty 10, 5d supply, fill #0

## 2022-12-05 NOTE — Discharge Instructions (Signed)
Prednisone daily for 5 days Rest, elevate, ice to the foot as needed Follow-up with your podiatrist or PCP for further imaging of your foot should your symptoms persist Please go to emergency room for any worsening symptoms

## 2022-12-05 NOTE — ED Provider Notes (Signed)
[facility]    CSN: [id number] Arrival date & time: 12/05/22  0806      History   Chief Complaint Chief Complaint  Patient presents with   Foot Pain    HPI Wayne Molina is a 67 y.o. male presents for evaluation of foot pain.  Patient was seen in urgent care on 1/19 for right foot pain with no known injury.  X-ray was negative.  He was advised to follow-up with podiatry and use OTC medications.  He did see podiatrist on 1/25 and was told it was arthritis after they did a repeat x-ray.  He was given a cortisone injection to the right foot and states he has had no improvement with this.  He endorses pain to the lateral dorsal aspect of the foot primarily with certain movements or walking.  No pain with rest.  Denies any swelling, bruising, numbness/tingling.  No history of right foot injuries or surgeries in the past.  He does state he had a similar situation to his left foot in the past that initially had negative x-rays but after MRI showed that he had a stress fracture.  He is concerned that this may be the same situation with his right foot.  He has been taking Tylenol and ibuprofen with no improvement.  No other concerns at this time.   Foot Pain    Past Medical History:  Diagnosis Date   Anxiety    Arthritis    fingers   Chronic atrial fibrillation (HCC)    Depression    Family history of breast cancer    Family history of cancer of male genital organ    Family history of gene mutation    BRIP1   Family history of malignant neoplasm of gastrointestinal tract    Hyperlipidemia    Hypertension    Personal history of colonic polyps 11/12/2006   Sleep apnea     Patient Active Problem List   Diagnosis Date Noted   Chest pain 08/17/2022   Postural dizziness with presyncope 06/10/2022   Genetic testing 03/19/2021   Family history of gene mutation    Family history of breast cancer    Family history of malignant neoplasm of gastrointestinal tract    Family  history of cancer of male genital organ    Peyronie disease 06/12/2020   Elevated bilirubin 05/26/2019   Lower urinary tract symptoms (LUTS) 05/24/2019   Osteoporosis 01/27/2017   Erectile dysfunction 01/29/2016   Syncope 01/01/2016   Lumbar vertebral fracture (HCC) 07/22/2015   Hyperlipidemia 07/22/2015   Myocardiopathy (HCC) 07/11/2013   Essential hypertension 05/23/2012   Sleep apnea: unable to tolerate CPAP 03/04/2011   Atrial fibrillation (HCC) 01/06/2007    Past Surgical History:  Procedure Laterality Date   2-D echocardiogram  07/22/2010   Ejection fraction greater than 55%. Left atrium moderately dilated. Right atrium moderately dilated. 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 Mild to Moderate MR. Mild to moderate TR.   ATRIAL ABLATION SURGERY  2004   EP IMPLANTABLE DEVICE N/A 02/11/2016   Procedure: Loop Recorder Insertion;  Surgeon: Thurmon Fair, MD;  Location: [facility];  Service: Cardiovascular;  Laterality: N/A;   Exercise Myoview stress test  07/08/2000   Nonischemic low-risk.   HIP FRACTURE SURGERY  1970s   IR GENERIC HISTORICAL  01/21/2017   IR RADIOLOGIST EVAL & MGMT 01/21/2017 [facility]       Home Medications    Prior to Admission medications   Medication Sig Start Date End  Date Taking? Authorizing Provider  acetaminophen (TYLENOL) 325 MG tablet Take 2 tablets (650 mg total) by mouth every 6 (six) hours as needed for moderate pain. 11/27/22  Yes Jaynee Eagles, PA-C  amLODipine (NORVASC) 10 MG tablet Take 1 tablet (10 mg total) by mouth daily. 11/17/22  Yes Chambliss, Jeb Levering, MD  apixaban (ELIQUIS) 5 MG TABS tablet Take 1 tablet (5 mg total) by mouth 2 (two) times daily. 07/23/22  Yes Croitoru, Mihai, MD  atorvastatin (LIPITOR) 40 MG tablet Take 1 tablet (40 mg total) by mouth daily. 06/02/22  Yes Chambliss, Jeb Levering, MD  calcium carbonate (OSCAL) 1500 (600 Ca) MG TABS tablet Take by mouth 2 (two) times daily with a meal.   Yes [provider]   Cyanocobalamin (VITAMIN B-12 PO) Take 1 capsule by mouth daily.   Yes [provider]  losartan (COZAAR) 100 MG tablet Take 1 tablet (100 mg total) by mouth daily. Patient taking differently: Take 100 mg by mouth at bedtime. 05/26/22  Yes Chambliss, Jeb Levering, MD  Magnesium 250 MG TABS Take 1 tablet by mouth daily.   Yes [provider]  Magnesium Oxide 250 MG TABS Take 1 tablet by mouth daily in the afternoon. 02/07/18  Yes [provider]  Multiple Vitamin (MULTIVITAMIN ADULT PO) Take 1 tablet by mouth daily. 02/07/18  Yes [provider]  MULTIPLE VITAMIN PO Take 1 tablet by mouth daily.   Yes [provider]  Potassium Gluconate 2 MEQ TABS Take 1 tablet by mouth daily in the afternoon. 02/07/18  Yes [provider]  POTASSIUM PO Take 1 tablet by mouth daily.   Yes [provider]  predniSONE (DELTASONE) 20 MG tablet Take 2 tablets (40 mg total) by mouth daily with breakfast for 5 days. 12/05/22 12/10/22 Yes Melynda Ripple, NP  sildenafil (VIAGRA) 50 MG tablet Take 2 tablets (100 mg total) by mouth daily as needed. 05/26/22  Yes Chambliss, Jeb Levering, MD    Family History Family History  Problem Relation Age of Onset   Dementia Mother    Heart disease Father    Diabetes Father    Other Sister        BRIP1 gene mutation   Kidney disease Brother    Colon cancer Maternal Uncle    Lung cancer Paternal Aunt        d. >50   Cancer Paternal Aunt        unknown type, d. >50   Cancer Cousin 60       gynecologic (paternal first cousin)   Colon cancer Nephew 40       arising in colon polyp   Cancer Niece 46       gynecologic; niece in her 72's   Breast cancer Niece 41   Other Niece        BRIP1 gene mutation   Cancer Paternal Great-grandmother        abdominal cancer (PGF's mother) great grandmother    Social History Social History   Tobacco Use   Smoking status: Never   Smokeless tobacco: Never  Vaping Use   Vaping Use:  Never used  Substance Use Topics   Alcohol use: No   Drug use: No     Allergies   Patient has no known allergies.   Review of Systems Review of Systems  Musculoskeletal:        Right foot pain      Physical Exam Triage Vital Signs ED Triage Vitals  Enc Vitals Group     BP 12/05/22 0818 (!) 149/81     Pulse Rate 12/05/22 0818 68     Resp 12/05/22 0818 18     Temp 12/05/22 0818 97.7 F (36.5 C)     Temp Source 12/05/22 0818 Oral     SpO2 12/05/22 0818 98 %     Weight 12/05/22 0820 165 lb (74.8 kg)     Height 12/05/22 0820 5\' 8"  (1.727 m)     Head Circumference --      Peak Flow --      Pain Score 12/05/22 0820 0     Pain Loc --      Pain Edu? --      Excl. in [place]? --    No data found.  Updated Vital Signs BP (!) 149/81 (BP Location: Right Arm)   Pulse 68   Temp 97.7 F (36.5 C) (Oral)   Resp 18   Ht 5\' 8"  (1.727 m)   Wt 165 lb (74.8 kg)   SpO2 98%   BMI 25.09 kg/m   Visual Acuity Right Eye Distance:   Left Eye Distance:   Bilateral Distance:    Right Eye Near:   Left Eye Near:    Bilateral Near:     Physical Exam Vitals and nursing note reviewed.  Constitutional:      Appearance: Normal appearance.  HENT:     Head: Normocephalic and atraumatic.  Eyes:     Pupils: Pupils are equal, round, and reactive to light.  Cardiovascular:     Rate and Rhythm: Normal rate.  Pulmonary:     Effort: Pulmonary effort is normal.  Musculoskeletal:       Feet:  Skin:    General: Skin is warm and dry.  Neurological:     General: No focal deficit present.     Mental Status: He is alert and oriented to person, place, and time.  Psychiatric:        Mood and Affect: Mood normal.        Behavior: Behavior normal.      UC Treatments / Results  Labs (all labs ordered are listed, but only abnormal results are displayed) Labs Reviewed - No data to display  EKG   Radiology DG Foot Complete Right  Result Date: 12/03/2022 Please see detailed radiograph  report in office note.   Procedures Procedures (including critical care time)  Medications Ordered in UC Medications - No data to display  Initial Impression / Assessment and Plan / UC Course  I have reviewed the triage vital signs and the nursing notes.  Pertinent labs & imaging results that were available during my care of the patient were reviewed by me and considered in my medical decision making (see chart for details).     Reviewed exam and symptoms with patient. Trial of oral prednisone Advise he follow-up either with the podiatrist or his PCP for further evaluation and possible additional imaging if indicated RICE therapy ER precautions reviewed and patient verbalized understanding Final Clinical Impressions(s) / UC Diagnoses   Final diagnoses:  Right foot pain     Discharge Instructions      Prednisone daily for 5 days Rest, elevate, ice to the foot as needed Follow-up with your podiatrist or PCP for further imaging of your foot should your symptoms persist Please go to emergency room for any worsening symptoms   ED Prescriptions     Medication Sig Dispense Auth. Provider   predniSONE (DELTASONE)  20 MG tablet Take 2 tablets (40 mg total) by mouth daily with breakfast for 5 days. 10 tablet Radford Pax, NP      PDMP not reviewed this encounter.   Radford Pax, NP 12/05/22 [phone]

## 2022-12-05 NOTE — ED Triage Notes (Signed)
Patient c/o right foot pain x 3 weeks, seen a podiatrist, given a steroid injection, no relief.  No injury.  Patient has taken Ibuprofen and Tylenol.

## 2022-12-08 ENCOUNTER — Other Ambulatory Visit (HOSPITAL_COMMUNITY): Payer: Self-pay

## 2022-12-08 ENCOUNTER — Other Ambulatory Visit: Payer: Self-pay

## 2022-12-08 ENCOUNTER — Other Ambulatory Visit: Payer: Self-pay | Admitting: Family Medicine

## 2022-12-08 MED ORDER — LOSARTAN POTASSIUM 100 MG PO TABS
100.0000 mg | ORAL_TABLET | Freq: Every day | ORAL | 1 refills | Status: DC
  Filled 2022-12-08: qty 90, 90d supply, fill #0
  Filled 2023-02-16: qty 90, 90d supply, fill #1

## 2022-12-14 ENCOUNTER — Encounter: Payer: Self-pay | Admitting: Family Medicine

## 2022-12-16 ENCOUNTER — Encounter: Payer: Self-pay | Admitting: Family Medicine

## 2022-12-16 ENCOUNTER — Ambulatory Visit (INDEPENDENT_AMBULATORY_CARE_PROVIDER_SITE_OTHER): Payer: No Typology Code available for payment source | Admitting: Family Medicine

## 2022-12-16 ENCOUNTER — Other Ambulatory Visit: Payer: Self-pay

## 2022-12-16 VITALS — BP 149/90 | HR 73 | Ht 68.0 in | Wt 173.4 lb

## 2022-12-16 DIAGNOSIS — M79671 Pain in right foot: Secondary | ICD-10-CM | POA: Diagnosis not present

## 2022-12-16 NOTE — Patient Instructions (Signed)
Good to see you today - Thank you for coming in  Things we discussed today:  R foot - I ordered the MRI - Continue mild weight bearing refrain from running or lifting weight when standing  Resume regular workouts as tolerated  Will follow bone density every 2 years   Your goal blood pressure is less than 140/90.  Check your blood pressure several times a week.  If regularly higher than this please let me know - either with MyChart or leaving a phone message. Next visit please bring in your blood pressure cuff.

## 2022-12-16 NOTE — Progress Notes (Signed)
    SUBJECTIVE:   CHIEF COMPLAINT / HPI:    R foot pain Patient description for My chart "My ( right ) foot has been hurting now for about a month. I have gone to an urgent care who took a XRAY and found nothing. They referred me the podiatrist again, Who took an XRAY found nothing then gave me a steroid shot. He said he thought it was arthritis. This sharp pain is not arthritis. It seems to be a Jones fracture. I have gone back to urgent care who gave me a oral steroid. That was over a week ago.  I have been limping around at work and home to the point that my hips and shins are hurting. Luckily ibuprofen takes care of that." Feels very similar to fracture of R foot he had in 2021 No specific trauma   PERTINENT  PMH / PSH:  On 11/16/2019 I went to urgent care with a injury of my ( left ) foot. They took an XRAY and found nothing. They put me in a boot and told me to see a podiatrist. I saw DR Martie Round who took a XRAY and found nothing but gave me a steroid shot and sent me on my way.  A few months later with the foot still not healed, he gave me a two more steroids shots then put me out of work for some weeks  ( FMLA ).  Finally in late May, he had an MRI taken that showed a fracture of the ( left ) foot. It still took awhile for it completely heal. OBJECTIVE:   BP (!) 149/90   Pulse 73   Ht 5\' 8"  (1.727 m)   Wt 173 lb 6.4 oz (78.7 kg)   SpO2 100%   BMI 26.37 kg/m   R foot - tender focal over mid lateral foot without palpable deformity or redness.  Walks with a limp   ASSESSMENT/PLAN:   Right foot pain Assessment & Plan: History and exam consistent with an occult fracture similar to one he had on his L foot in 2021.  Will order MRI and recommend limited use but not put in boot or splint given prior negative plain xrays.  Note patient has history of osteopenia   Orders: -     MR FOOT RIGHT WO CONTRAST; Future     Patient Instructions  Good to see you today - Thank you  for coming in  Things we discussed today:  R foot - I ordered the MRI - Continue mild weight bearing refrain from running or lifting weight when standing  Resume regular workouts as tolerated  Will follow bone density every 2 years   Your goal blood pressure is less than 140/90.  Check your blood pressure several times a week.  If regularly higher than this please let me know - either with MyChart or leaving a phone message. Next visit please bring in your blood pressure cuff.       Lind Covert, MD [place]

## 2022-12-16 NOTE — Assessment & Plan Note (Signed)
History and exam consistent with an occult fracture similar to one he had on his L foot in 2021.  Will order MRI and recommend limited use but not put in boot or splint given prior negative plain xrays.  Note patient has history of osteopenia

## 2022-12-16 NOTE — Assessment & Plan Note (Signed)
2023 - The BMD measured at Femur Total is 0.742 g/cm2 with a T-score of -2.1 2018 - The BMD measured at Femur Total Left is 0.704 g/cm2 with a T-score of -2.4.

## 2022-12-22 ENCOUNTER — Ambulatory Visit: Payer: No Typology Code available for payment source

## 2022-12-22 DIAGNOSIS — R55 Syncope and collapse: Secondary | ICD-10-CM | POA: Diagnosis not present

## 2022-12-23 LAB — CUP PACEART REMOTE DEVICE CHECK
Date Time Interrogation Session: 20240213214316
Implantable Pulse Generator Implant Date: 20240111

## 2022-12-25 ENCOUNTER — Other Ambulatory Visit (HOSPITAL_COMMUNITY): Payer: Self-pay

## 2022-12-28 ENCOUNTER — Ambulatory Visit (HOSPITAL_COMMUNITY)
Admission: RE | Admit: 2022-12-28 | Discharge: 2022-12-28 | Disposition: A | Discharge status: Home / Self Care | Payer: No Typology Code available for payment source | Source: Ambulatory Visit | Attending: Family Medicine | Admitting: Family Medicine

## 2022-12-28 DIAGNOSIS — S92354A Nondisplaced fracture of fifth metatarsal bone, right foot, initial encounter for closed fracture: Secondary | ICD-10-CM | POA: Diagnosis not present

## 2022-12-28 DIAGNOSIS — M79671 Pain in right foot: Secondary | ICD-10-CM | POA: Insufficient documentation

## 2022-12-31 ENCOUNTER — Encounter: Payer: Self-pay | Admitting: Family Medicine

## 2022-12-31 DIAGNOSIS — M79671 Pain in right foot: Secondary | ICD-10-CM

## 2023-01-13 DIAGNOSIS — M79671 Pain in right foot: Secondary | ICD-10-CM | POA: Diagnosis not present

## 2023-01-20 ENCOUNTER — Encounter: Payer: Self-pay | Admitting: Family Medicine

## 2023-01-22 ENCOUNTER — Ambulatory Visit
Admission: EM | Admit: 2023-01-22 | Discharge: 2023-01-22 | Disposition: A | Discharge status: Home / Self Care | Payer: No Typology Code available for payment source | Attending: Urgent Care | Admitting: Urgent Care

## 2023-01-22 ENCOUNTER — Other Ambulatory Visit (HOSPITAL_COMMUNITY): Payer: Self-pay

## 2023-01-22 DIAGNOSIS — J018 Other acute sinusitis: Secondary | ICD-10-CM

## 2023-01-22 MED ORDER — AMOXICILLIN 875 MG PO TABS
875.0000 mg | ORAL_TABLET | Freq: Two times a day (BID) | ORAL | 0 refills | Status: DC
  Filled 2023-01-22: qty 14, 7d supply, fill #0

## 2023-01-22 MED ORDER — PROMETHAZINE-DM 6.25-15 MG/5ML PO SYRP
5.0000 mL | ORAL_SOLUTION | Freq: Three times a day (TID) | ORAL | 0 refills | Status: DC | PRN
  Filled 2023-01-22: qty 200, 14d supply, fill #0

## 2023-01-22 NOTE — ED Triage Notes (Signed)
Pt c/o dry cough x 6 days-NAD-steady gait

## 2023-01-22 NOTE — ED Provider Notes (Signed)
[facility]  Note:  This document was prepared using Systems analyst and may include unintentional dictation errors.  MRN: [id number] DOB: 1956-10-15  Subjective:   Wayne Molina is a 67 y.o. male with pmh of chronic atrial fibrillation, myocardiopathy, syncope presenting for 6 day history of persistent dry cough that elicits mild throat pain, chest discomfort.  No fever, runny or stuffy nose, ear pain, shob, wheezing, n/v, abdominal pain, body aches. No smoking of any kind including cigarettes, cigars, vaping, marijuana use.    No current facility-administered medications for this encounter.  Current Outpatient Medications:    acetaminophen (TYLENOL) 325 MG tablet, Take 2 tablets (650 mg total) by mouth every 6 (six) hours as needed for moderate pain., Disp: 30 tablet, Rfl: 0   amLODipine (NORVASC) 10 MG tablet, Take 1 tablet (10 mg total) by mouth daily., Disp: 90 tablet, Rfl: 0   apixaban (ELIQUIS) 5 MG TABS tablet, Take 1 tablet (5 mg total) by mouth 2 (two) times daily., Disp: 60 tablet, Rfl: 6   atorvastatin (LIPITOR) 40 MG tablet, Take 1 tablet (40 mg total) by mouth daily., Disp: 90 tablet, Rfl: 3   calcium carbonate (OSCAL) 1500 (600 Ca) MG TABS tablet, Take by mouth 2 (two) times daily with a meal., Disp: , Rfl:    Cyanocobalamin (VITAMIN B-12 PO), Take 1 capsule by mouth daily., Disp: , Rfl:    losartan (COZAAR) 100 MG tablet, Take 1 tablet (100 mg total) by mouth daily., Disp: 90 tablet, Rfl: 1   Magnesium 250 MG TABS, Take 1 tablet by mouth daily., Disp: , Rfl:    Magnesium Oxide 250 MG TABS, Take 1 tablet by mouth daily in the afternoon., Disp: , Rfl:    Multiple Vitamin (MULTIVITAMIN ADULT PO), Take 1 tablet by mouth daily., Disp: , Rfl:    MULTIPLE VITAMIN PO, Take 1 tablet by mouth daily., Disp: , Rfl:    Potassium Gluconate 2 MEQ TABS, Take 1 tablet by mouth daily in the afternoon., Disp: , Rfl:    POTASSIUM PO, Take 1 tablet by  mouth daily., Disp: , Rfl:    sildenafil (VIAGRA) 50 MG tablet, Take 2 tablets (100 mg total) by mouth daily as needed., Disp: 10 tablet, Rfl: 2   No Known Allergies  Past Medical History:  Diagnosis Date   Anxiety    Arthritis    fingers   Chronic atrial fibrillation (HCC)    Depression    Family history of breast cancer    Family history of cancer of male genital organ    Family history of gene mutation    BRIP1   Family history of malignant neoplasm of gastrointestinal tract    Hyperlipidemia    Hypertension    Personal history of colonic polyps 11/12/2006   Sleep apnea      Past Surgical History:  Procedure Laterality Date   2-D echocardiogram  07/22/2010   Ejection fraction greater than 55%. Left atrium moderately dilated. Right atrium moderately dilated. 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 Mild to Moderate MR. Mild to moderate TR.   ATRIAL ABLATION SURGERY  2004   EP IMPLANTABLE DEVICE N/A 02/11/2016   Procedure: Loop Recorder Insertion;  Surgeon: Sanda Klein, MD;  Location: [facility];  Service: Cardiovascular;  Laterality: N/A;   Exercise Myoview stress test  07/08/2000   Nonischemic low-risk.   HIP FRACTURE SURGERY  1970s   IR GENERIC HISTORICAL  01/21/2017   IR RADIOLOGIST EVAL &  MGMT 01/21/2017 [facility]    Family History  Problem Relation Age of Onset   Dementia Mother    Heart disease Father    Diabetes Father    Other Sister        BRIP1 gene mutation   Kidney disease Brother    Colon cancer Maternal Uncle    Lung cancer Paternal Aunt        d. >50   Cancer Paternal Aunt        unknown type, d. >50   Cancer Cousin 7       gynecologic (paternal first cousin)   Colon cancer Nephew 19       arising in colon polyp   Cancer Niece 4       gynecologic; niece in her 39's   Breast cancer Niece 2   Other Niece        BRIP1 gene mutation   Cancer Paternal Great-grandmother        abdominal cancer (PGF's mother) great grandmother    Social  History   Tobacco Use   Smoking status: Never   Smokeless tobacco: Never  Vaping Use   Vaping Use: Never used  Substance Use Topics   Alcohol use: No   Drug use: No    ROS   Objective:   Vitals: BP 135/82 (BP Location: Right Arm)   Pulse 69   Temp 98 F (36.7 C) (Oral)   Resp 18   SpO2 96%   Physical Exam Constitutional:      General: He is not in acute distress.    Appearance: Normal appearance. He is well-developed and normal weight. He is not ill-appearing, toxic-appearing or diaphoretic.  HENT:     Head: Normocephalic and atraumatic.     Right Ear: External ear normal.     Left Ear: External ear normal.     Nose: Nose normal.     Mouth/Throat:     Mouth: Mucous membranes are moist.     Pharynx: No pharyngeal swelling, oropharyngeal exudate, posterior oropharyngeal erythema or uvula swelling.     Tonsils: No tonsillar exudate or tonsillar abscesses. 0 on the right. 0 on the left.     Comments: Thick purulent postnasal drainage overlying pharynx. Eyes:     General: No scleral icterus.       Right eye: No discharge.        Left eye: No discharge.     Extraocular Movements: Extraocular movements intact.  Cardiovascular:     Rate and Rhythm: Normal rate and regular rhythm.     Heart sounds: Normal heart sounds. No murmur heard.    No friction rub. No gallop.  Pulmonary:     Effort: Pulmonary effort is normal. No respiratory distress.     Breath sounds: Normal breath sounds. No stridor. No wheezing, rhonchi or rales.  Musculoskeletal:     Cervical back: Normal range of motion.  Neurological:     Mental Status: He is alert and oriented to person, place, and time.  Psychiatric:        Mood and Affect: Mood normal.        Behavior: Behavior normal.        Thought Content: Thought content normal.        Judgment: Judgment normal.     Assessment and Plan :   PDMP not reviewed this encounter.  1. Acute non-recurrent sinusitis of other sinus     Offered  chest x-ray but patient declined and I am  in agreement.  Deferred imaging given clear cardiopulmonary exam, hemodynamically stable vital signs. Will start empiric treatment for sinusitis with amoxicillin.  Recommended supportive care otherwise. Counseled patient on potential for adverse effects with medications prescribed/recommended today, ER and return-to-clinic precautions discussed, patient verbalized understanding.    Jaynee Eagles, [place] 01/22/23 [phone]

## 2023-01-22 NOTE — Discharge Instructions (Addendum)
We will manage this as a sinus infection with amoxicillin. For sore throat or cough try using a honey-based tea. Use 3 teaspoons of honey with juice squeezed from half lemon. Place shaved pieces of ginger into 1/2-1 cup of water and warm over stove top. Then mix the ingredients and repeat every 4 hours as needed. Please take Tylenol 500mg -650mg  every 6 hours for throat pain, fevers, aches and pains. Hydrate very well with at least 2 liters of water. Eat light meals such as soups (chicken and noodles, vegetable, chicken and wild rice).  Do not eat foods that you are allergic to.  Taking an antihistamine like Zyrtec can help against postnasal drainage, sinus congestion which can cause sinus pain, sinus headaches, throat pain, painful swallowing, coughing.  You can take this together with cough medication as needed.

## 2023-01-25 ENCOUNTER — Ambulatory Visit (INDEPENDENT_AMBULATORY_CARE_PROVIDER_SITE_OTHER): Payer: No Typology Code available for payment source

## 2023-01-25 DIAGNOSIS — R55 Syncope and collapse: Secondary | ICD-10-CM | POA: Diagnosis not present

## 2023-01-25 NOTE — Progress Notes (Signed)
Carelink Summary Report / Loop Recorder 

## 2023-01-26 ENCOUNTER — Other Ambulatory Visit (HOSPITAL_COMMUNITY): Payer: Self-pay

## 2023-01-27 LAB — CUP PACEART REMOTE DEVICE CHECK
Date Time Interrogation Session: 20240317232446
Implantable Pulse Generator Implant Date: 20240111

## 2023-02-16 ENCOUNTER — Other Ambulatory Visit (HOSPITAL_COMMUNITY): Payer: Self-pay

## 2023-02-23 ENCOUNTER — Encounter: Payer: Self-pay | Admitting: Podiatry

## 2023-02-23 ENCOUNTER — Ambulatory Visit (INDEPENDENT_AMBULATORY_CARE_PROVIDER_SITE_OTHER): Payer: No Typology Code available for payment source | Admitting: Podiatry

## 2023-02-23 DIAGNOSIS — B351 Tinea unguium: Secondary | ICD-10-CM

## 2023-02-23 NOTE — Progress Notes (Signed)
  Subjective:  Patient ID: Wayne Molina, male    DOB: [birth year],  MRN: [id number]  Chief Complaint  Patient presents with   Nail Problem    Wants to discuss laser treatments    67 y.o. male returns for the above complaint.  Patient presents with thickened Mega dystrophic mycotic toenails x 10.  He has tried over-the-counter option as well as prescription options including oral medication.  He would like to discuss treatment options for laser now.  He states that laser is the only thing that is helped.  He does not want to have it removed  Objective:  There were no vitals filed for this visit. Podiatric Exam: Vascular: dorsalis pedis and posterior tibial pulses are palpable bilateral. Capillary return is immediate. Temperature gradient is WNL. Skin turgor WNL  Sensorium: Normal Semmes Weinstein monofilament test. Normal tactile sensation bilaterally. Nail Exam: Pt has thick disfigured discolored nails with subungual debris noted bilateral entire nail hallux through fifth toenails.  Pain on palpation to the nails. Ulcer Exam: There is no evidence of ulcer or pre-ulcerative changes or infection. Orthopedic Exam: Muscle tone and strength are WNL. No limitations in general ROM. No crepitus or effusions noted.  Skin: No Porokeratosis. No infection or ulcers    Assessment & Plan:   1. Nail fungus   2. Onychomycosis due to dermatophyte     Patient was evaluated and treated and all questions answered.  Monica's toenails x 10  Educated the patient on the etiology of onychomycosis and various treatment options associated with improving the fungal load.  I explained to the patient that there is 3 treatment options available to treat the onychomycosis including topical, p.o., laser treatment.  Patient elected to undergo laser therapy.  I discussed with the patient with a 6-7 sessions generally.  He can take a little bit more due to the severe amount of nail fungus is present he states  understanding like to proceed with nail laser treatment   Nicholes Rough, DPM    No follow-ups on file.

## 2023-02-25 ENCOUNTER — Other Ambulatory Visit (HOSPITAL_COMMUNITY): Payer: Self-pay

## 2023-02-25 ENCOUNTER — Other Ambulatory Visit: Payer: Self-pay | Admitting: Cardiovascular Disease

## 2023-02-25 MED ORDER — APIXABAN 5 MG PO TABS
5.0000 mg | ORAL_TABLET | Freq: Two times a day (BID) | ORAL | 6 refills | Status: DC
  Filled 2023-02-25: qty 60, 30d supply, fill #0
  Filled 2023-03-23: qty 60, 30d supply, fill #1
  Filled 2023-04-22: qty 60, 30d supply, fill #2
  Filled 2023-05-22: qty 60, 30d supply, fill #3
  Filled 2023-06-19: qty 60, 30d supply, fill #4
  Filled 2023-07-19: qty 60, 30d supply, fill #5
  Filled 2023-08-20: qty 60, 30d supply, fill #6

## 2023-02-25 NOTE — Telephone Encounter (Signed)
Prescription refill request for Eliquis received. Indication: afib  Last office visit: [place], 09/22/2022 Scr: 1.12, 08/09/2022 Age: 67 yo  Weight: 78.7 kg   Refill sent.

## 2023-03-01 ENCOUNTER — Ambulatory Visit (INDEPENDENT_AMBULATORY_CARE_PROVIDER_SITE_OTHER): Payer: No Typology Code available for payment source

## 2023-03-01 DIAGNOSIS — R55 Syncope and collapse: Secondary | ICD-10-CM | POA: Diagnosis not present

## 2023-03-02 LAB — CUP PACEART REMOTE DEVICE CHECK
Date Time Interrogation Session: 20240419230834
Implantable Pulse Generator Implant Date: 20240111

## 2023-03-05 ENCOUNTER — Ambulatory Visit (INDEPENDENT_AMBULATORY_CARE_PROVIDER_SITE_OTHER): Payer: No Typology Code available for payment source

## 2023-03-05 VITALS — BP 133/76 | HR 76

## 2023-03-05 DIAGNOSIS — B351 Tinea unguium: Secondary | ICD-10-CM

## 2023-03-05 NOTE — Patient Instructions (Signed)

## 2023-03-05 NOTE — Progress Notes (Signed)
Patient presents today for the 1st laser treatment. Diagnosed with mycotic nail infection by Dr. Allena Katz.   Toenail most affected bilateral 1st.  All other systems are negative.  Nails were filed thin. Laser therapy was administered to 1-5 toenails bilateral and patient tolerated the treatment well. All safety precautions were in place.      Follow up in 4 weeks for laser # 2.

## 2023-03-09 NOTE — Progress Notes (Signed)
Carelink Summary Report / Loop Recorder 

## 2023-03-23 ENCOUNTER — Other Ambulatory Visit (HOSPITAL_COMMUNITY): Payer: Self-pay

## 2023-04-01 LAB — CUP PACEART REMOTE DEVICE CHECK
Date Time Interrogation Session: 20240522230524
Implantable Pulse Generator Implant Date: 20240111

## 2023-04-06 ENCOUNTER — Ambulatory Visit (INDEPENDENT_AMBULATORY_CARE_PROVIDER_SITE_OTHER): Payer: No Typology Code available for payment source

## 2023-04-06 DIAGNOSIS — R55 Syncope and collapse: Secondary | ICD-10-CM

## 2023-04-07 NOTE — Progress Notes (Signed)
Carelink Summary Report / Loop Recorder 

## 2023-04-16 ENCOUNTER — Ambulatory Visit (INDEPENDENT_AMBULATORY_CARE_PROVIDER_SITE_OTHER): Payer: No Typology Code available for payment source

## 2023-04-16 VITALS — Ht 68.0 in | Wt 173.0 lb

## 2023-04-16 DIAGNOSIS — B351 Tinea unguium: Secondary | ICD-10-CM

## 2023-04-16 DIAGNOSIS — Z0000 Encounter for general adult medical examination without abnormal findings: Secondary | ICD-10-CM

## 2023-04-16 NOTE — Patient Instructions (Addendum)
Wayne Molina , Thank you for taking time to come for your Medicare Wellness Visit. I appreciate your ongoing commitment to your health goals. Please review the following plan we discussed and let me know if I can assist you in the future.   These are the goals we discussed:  Goals      Patient Stated     Maintain my level of activity.         This is a list of the screening recommended for you and due dates:  Health Maintenance  Topic Date Due   Pneumonia Vaccine (2 of 2 - PCV) 05/23/2020   COVID-19 Vaccine (4 - 2023-24 season) 07/10/2022   Flu Shot  06/10/2023   Medicare Annual Wellness Visit  04/15/2024   Colon Cancer Screening  06/04/2025   DTaP/Tdap/Td vaccine (3 - Td or Tdap) 01/27/2028   Hepatitis C Screening  Completed   Zoster (Shingles) Vaccine  Completed   HPV Vaccine  Aged Out    Advanced directives: Information on Advanced Care Planning can be found at [facility] Advance Health Care Directives Advance Health Care Directives ([email or web address]  Please bring a copy of your health care power of attorney and living will to the office to be added to your chart at your convenience.   Conditions/risks identified: Aim for 30 minutes of exercise or brisk walking, 6-8 glasses of water, and 5 servings of fruits and vegetables each day.  Next appointment: Follow up in one year for your annual wellness visit.   Preventive Care 18 Years and Older, Male  Preventive care refers to lifestyle choices and visits with your health care provider that can promote health and wellness. What does preventive care include? A yearly physical exam. This is also called an annual well check. Dental exams once or twice a year. Routine eye exams. Ask your health care provider how often you should have your eyes checked. Personal lifestyle choices, including: Daily care of your teeth and gums. Regular physical activity. Eating a healthy diet. Avoiding tobacco and drug use. Limiting  alcohol use. Practicing safe sex. Taking low doses of aspirin every day. Taking vitamin and mineral supplements as recommended by your health care provider. What happens during an annual well check? The services and screenings done by your health care provider during your annual well check will depend on your age, overall health, lifestyle risk factors, and family history of disease. Counseling  Your health care provider may ask you questions about your: Alcohol use. Tobacco use. Drug use. Emotional well-being. Home and relationship well-being. Sexual activity. Eating habits. History of falls. Memory and ability to understand (cognition). Work and work Astronomer. Screening  You may have the following tests or measurements: Height, weight, and BMI. Blood pressure. Lipid and cholesterol levels. These may be checked every 5 years, or more frequently if you are over 20 years old. Skin check. Lung cancer screening. You may have this screening every year starting at age 3 if you have a 30-pack-year history of smoking and currently smoke or have quit within the past 15 years. Fecal occult blood test (FOBT) of the stool. You may have this test every year starting at age 58. Flexible sigmoidoscopy or colonoscopy. You may have a sigmoidoscopy every 5 years or a colonoscopy every 10 years starting at age 60. Prostate cancer screening. Recommendations will vary depending on your family history and other risks. Hepatitis C blood test. Hepatitis B blood test. Sexually transmitted disease (STD) testing. Diabetes screening.  This is done by checking your blood sugar (glucose) after you have not eaten for a while (fasting). You may have this done every 1-3 years. Abdominal aortic aneurysm (AAA) screening. You may need this if you are a current or former smoker. Osteoporosis. You may be screened starting at age 31 if you are at high risk. Talk with your health care provider about your test results,  treatment options, and if necessary, the need for more tests. Vaccines  Your health care provider may recommend certain vaccines, such as: Influenza vaccine. This is recommended every year. Tetanus, diphtheria, and acellular pertussis (Tdap, Td) vaccine. You may need a Td booster every 10 years. Zoster vaccine. You may need this after age 19. Pneumococcal 13-valent conjugate (PCV13) vaccine. One dose is recommended after age 65. Pneumococcal polysaccharide (PPSV23) vaccine. One dose is recommended after age 67. Talk to your health care provider about which screenings and vaccines you need and how often you need them. This information is not intended to replace advice given to you by your health care provider. Make sure you discuss any questions you have with your health care provider. Document Released: 11/22/2015 Document Revised: 07/15/2016 Document Reviewed: 08/27/2015 Elsevier Interactive Patient Education  2017 [employer].  Fall Prevention in the Home Falls can cause injuries. They can happen to people of all ages. There are many things you can do to make your home safe and to help prevent falls. What can I do on the outside of my home? Regularly fix the edges of walkways and driveways and fix any cracks. Remove anything that might make you trip as you walk through a door, such as a raised step or threshold. Trim any bushes or trees on the path to your home. Use bright outdoor lighting. Clear any walking paths of anything that might make someone trip, such as rocks or tools. Regularly check to see if handrails are loose or broken. Make sure that both sides of any steps have handrails. Any raised decks and porches should have guardrails on the edges. Have any leaves, snow, or ice cleared regularly. Use sand or salt on walking paths during winter. Clean up any spills in your garage right away. This includes oil or grease spills. What can I do in the bathroom? Use night  lights. Install grab bars by the toilet and in the tub and shower. Do not use towel bars as grab bars. Use non-skid mats or decals in the tub or shower. If you need to sit down in the shower, use a plastic, non-slip stool. Keep the floor dry. Clean up any water that spills on the floor as soon as it happens. Remove soap buildup in the tub or shower regularly. Attach bath mats securely with double-sided non-slip rug tape. Do not have throw rugs and other things on the floor that can make you trip. What can I do in the bedroom? Use night lights. Make sure that you have a light by your bed that is easy to reach. Do not use any sheets or blankets that are too big for your bed. They should not hang down onto the floor. Have a firm chair that has side arms. You can use this for support while you get dressed. Do not have throw rugs and other things on the floor that can make you trip. What can I do in the kitchen? Clean up any spills right away. Avoid walking on wet floors. Keep items that you use a lot in easy-to-reach places. If  you need to reach something above you, use a strong step stool that has a grab bar. Keep electrical cords out of the way. Do not use floor polish or wax that makes floors slippery. If you must use wax, use non-skid floor wax. Do not have throw rugs and other things on the floor that can make you trip. What can I do with my stairs? Do not leave any items on the stairs. Make sure that there are handrails on both sides of the stairs and use them. Fix handrails that are broken or loose. Make sure that handrails are as long as the stairways. Check any carpeting to make sure that it is firmly attached to the stairs. Fix any carpet that is loose or worn. Avoid having throw rugs at the top or bottom of the stairs. If you do have throw rugs, attach them to the floor with carpet tape. Make sure that you have a light switch at the top of the stairs and the bottom of the stairs. If  you do not have them, ask someone to add them for you. What else can I do to help prevent falls? Wear shoes that: Do not have high heels. Have rubber bottoms. Are comfortable and fit you well. Are closed at the toe. Do not wear sandals. If you use a stepladder: Make sure that it is fully opened. Do not climb a closed stepladder. Make sure that both sides of the stepladder are locked into place. Ask someone to hold it for you, if possible. Clearly [place] and make sure that you can see: Any grab bars or handrails. First and last steps. Where the edge of each step is. Use tools that help you move around (mobility aids) if they are needed. These include: Canes. Walkers. Scooters. Crutches. Turn on the lights when you go into a dark area. Replace any light bulbs as soon as they burn out. Set up your furniture so you have a clear path. Avoid moving your furniture around. If any of your floors are uneven, fix them. If there are any pets around you, be aware of where they are. Review your medicines with your doctor. Some medicines can make you feel dizzy. This can increase your chance of falling. Ask your doctor what other things that you can do to help prevent falls. This information is not intended to replace advice given to you by your health care provider. Make sure you discuss any questions you have with your health care provider. Document Released: 08/22/2009 Document Revised: 04/02/2016 Document Reviewed: 11/30/2014 Elsevier Interactive Patient Education  2017 [employer].

## 2023-04-16 NOTE — Progress Notes (Signed)
Patient presents today for the 2nd laser treatment. Diagnosed with mycotic nail infection by Dr. Allena Katz.   Toenail most affected bilateral 1st.  All other systems are negative.  Nails were filed thin. Laser therapy was administered to 1-5 toenails bilateral and patient tolerated the treatment well. All safety precautions were in place.      Follow up in 4 weeks for laser # 3.

## 2023-04-16 NOTE — Progress Notes (Cosign Needed)
Subjective:   Wayne Molina is a 67 y.o. male who presents for Medicare Annual/Subsequent preventive examination.  I connected with  Wayne Molina on 04/16/23 by a audio enabled telemedicine application and verified that I am speaking with the correct person using two identifiers.  Patient Location: Home  Provider Location: Home Office  I discussed the limitations of evaluation and management by telemedicine. The patient expressed understanding and agreed to proceed.  Review of Systems     Cardiac Risk Factors include: advanced age (>15men, >22 women);hypertension;male gender     Objective:    Today's Vitals   04/16/23 1508  Weight: 173 lb (78.5 kg)  Height: 5\' 8"  (1.727 m)   Body mass index is 26.3 kg/m.     04/16/2023    3:18 PM 12/16/2022    9:28 AM 09/09/2022    8:31 AM 08/17/2022   10:14 AM 06/30/2022    9:09 AM 06/11/2022    5:00 AM 05/26/2022    9:05 AM  Advanced Directives  Does Patient Have a Medical Advance Directive? No No No No No No No  Would patient like information on creating a medical advance directive? Yes (MAU/Ambulatory/Procedural Areas - Information given) No - Patient declined No - Patient declined No - Patient declined No - Patient declined No - Patient declined No - Patient declined    Current Medications (verified) Outpatient Encounter Medications as of 04/16/2023  Medication Sig   acetaminophen (TYLENOL) 325 MG tablet Take 2 tablets (650 mg total) by mouth every 6 (six) hours as needed for moderate pain.   amLODipine (NORVASC) 10 MG tablet Take 1 tablet (10 mg total) by mouth daily.   apixaban (ELIQUIS) 5 MG TABS tablet Take 1 tablet (5 mg total) by mouth 2 (two) times daily.   atorvastatin (LIPITOR) 40 MG tablet Take 1 tablet (40 mg total) by mouth daily.   calcium carbonate (OSCAL) 1500 (600 Ca) MG TABS tablet Take by mouth 2 (two) times daily with a meal.   Cyanocobalamin (VITAMIN B-12 PO) Take 1 capsule by mouth daily.   losartan (COZAAR) 100 MG  tablet Take 1 tablet (100 mg total) by mouth daily.   Magnesium 250 MG TABS Take 1 tablet by mouth daily.   Magnesium Oxide 250 MG TABS Take 1 tablet by mouth daily in the afternoon.   Multiple Vitamin (MULTIVITAMIN ADULT PO) Take 1 tablet by mouth daily.   MULTIPLE VITAMIN PO Take 1 tablet by mouth daily.   Potassium Gluconate 2 MEQ TABS Take 1 tablet by mouth daily in the afternoon.   POTASSIUM PO Take 1 tablet by mouth daily.   sildenafil (VIAGRA) 50 MG tablet Take 2 tablets (100 mg total) by mouth daily as needed.   [DISCONTINUED] amoxicillin (AMOXIL) 875 MG tablet Take 1 tablet (875 mg total) by mouth 2 (two) times daily.   [DISCONTINUED] promethazine-dextromethorphan (PROMETHAZINE-DM) 6.25-15 MG/5ML syrup Take 5 mLs by mouth 3 (three) times daily as needed for cough.   No facility-administered encounter medications on file as of 04/16/2023.    Allergies (verified) Patient has no known allergies.   History: Past Medical History:  Diagnosis Date   Anxiety    Arthritis    fingers   Chronic atrial fibrillation (HCC)    Depression    Family history of breast cancer    Family history of cancer of male genital organ    Family history of gene mutation    BRIP1   Family history of malignant neoplasm of  gastrointestinal tract    Hyperlipidemia    Hypertension    Personal history of colonic polyps 11/12/2006   Sleep apnea    Past Surgical History:  Procedure Laterality Date   2-D echocardiogram  07/22/2010   Ejection fraction greater than 55%. Left atrium moderately dilated. Right atrium moderately dilated. 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 Mild to Moderate MR. Mild to moderate TR.   ATRIAL ABLATION SURGERY  2004   EP IMPLANTABLE DEVICE N/A 02/11/2016   Procedure: Loop Recorder Insertion;  Surgeon: Thurmon Fair, MD;  Location: [facility];  Service: Cardiovascular;  Laterality: N/A;   Exercise Myoview stress test  07/08/2000   Nonischemic low-risk.   HIP FRACTURE SURGERY   1970s   IR GENERIC HISTORICAL  01/21/2017   IR RADIOLOGIST EVAL & MGMT 01/21/2017 [facility]   Family History  Problem Relation Age of Onset   Dementia Mother    Heart disease Father    Diabetes Father    Other Sister        BRIP1 gene mutation   Kidney disease Brother    Colon cancer Maternal Uncle    Lung cancer Paternal Aunt        d. >50   Cancer Paternal Aunt        unknown type, d. >50   Cancer Cousin 17       gynecologic (paternal first cousin)   Colon cancer Nephew 27       arising in colon polyp   Cancer Niece 1       gynecologic; niece in her 55's   Breast cancer Niece 35   Other Niece        BRIP1 gene mutation   Cancer Paternal Great-grandmother        abdominal cancer (PGF's mother) great grandmother   Social History   Socioeconomic History   Marital status: Divorced    Spouse name: Not on file   Number of children: 3   Years of education: 12   Highest education level: GED or equivalent  Occupational History   Not on file  Tobacco Use   Smoking status: Never   Smokeless tobacco: Never  Vaping Use   Vaping Use: Never used  Substance and Sexual Activity   Alcohol use: No   Drug use: No   Sexual activity: Yes  Other Topics Concern   Not on file  Social History Narrative   Patient lives alone in [place] in a one story home. There are no steps.    Patient enjoys going to the gym, watching TV, and going to antique shops.    Patient is divorced with 3 children, two of which have passed; does have a girlfriend.    Patient has his own car and is able to drive.    Social Determinants of Health   Financial Resource Strain: Low Risk  (04/16/2023)   Overall Financial Resource Strain (CARDIA)    Difficulty of Paying Living Expenses: Not hard at all  Food Insecurity: No Food Insecurity (04/16/2023)   Hunger Vital Sign    Worried About Running Out of Food in the Last Year: Never true    Ran Out of Food in the Last Year: Never true  Transportation Needs:  No Transportation Needs (04/16/2023)   PRAPARE - Administrator, Civil Service (Medical): No    Lack of Transportation (Non-Medical): No  Physical Activity: Sufficiently Active (04/16/2023)   Exercise Vital Sign    Days of Exercise per Week: 5  days    Minutes of Exercise per Session: 90 min  Stress: No Stress Concern Present (04/16/2023)   [employer] of Occupational Health - Occupational Stress Questionnaire    Feeling of Stress : Not at all  Social Connections: Moderately Isolated (04/16/2023)   Social Connection and Isolation Panel [NHANES]    Frequency of Communication with Friends and Family: More than three times a week    Frequency of Social Gatherings with Friends and Family: Three times a week    Attends Religious Services: Never    Active Member of Clubs or Organizations: Yes    Attends Banker Meetings: Never    Marital Status: Divorced    Tobacco Counseling Counseling given: Not Answered   Clinical Intake:  Pre-visit preparation completed: Yes  Pain : No/denies pain  Diabetes: No  How often do you need to have someone help you when you read instructions, pamphlets, or other written materials from your doctor or pharmacy?: 1 - Never  Diabetic?No   Interpreter Needed?: No  Information entered by :: Kandis Fantasia LPN   Activities of Daily Living    04/16/2023    3:18 PM 06/11/2022    5:00 AM  In your present state of health, do you have any difficulty performing the following activities:  Hearing? 0 0  Vision? 0 0  Difficulty concentrating or making decisions? 0 0  Walking or climbing stairs? 0 0  Dressing or bathing? 0 0  Doing errands, shopping? 0 0  Preparing Food and eating ? N   Using the Toilet? N   In the past six months, have you accidently leaked urine? N   Do you have problems with loss of bowel control? N   Managing your Medications? N   Managing your Finances? N   Housekeeping or managing your Housekeeping? N      Patient Care Team: Carney Living, MD as PCP - General Croitoru, Rachelle Hora, MD as PCP - Cardiology (Cardiology) Thurmon Fair, MD as Consulting Physician (Cardiology) Candelaria Stagers, DPM as Consulting Physician (Podiatry)  Indicate any recent Medical Services you may have received from other than [facility] providers in the past year (date may be approximate).     Assessment:   This is a routine wellness examination for [place].  Hearing/Vision screen Hearing Screening - Comments:: Denies hearing difficulties   Vision Screening - Comments:: up to date with routine eye exams     Dietary issues and exercise activities discussed: Current Exercise Habits: Home exercise routine, Type of exercise: walking;stretching, Time (Minutes): 60, Frequency (Times/Week): 5, Weekly Exercise (Minutes/Week): 300, Intensity: Mild   Goals Addressed             This Visit's Progress    Patient Stated   On track    Maintain my level of activity.        Depression Screen    04/16/2023    3:17 PM 12/16/2022   11:14 AM 08/17/2022   10:14 AM 05/26/2022    9:05 AM 02/20/2022    8:39 AM 06/18/2021    1:58 PM 06/12/2020    8:37 AM  PHQ 2/9 Scores  PHQ - 2 Score 0 0 4 0 0 2 0  PHQ- 9 Score  0 14 0 0 4 1    Fall Risk    04/16/2023    3:16 PM 12/16/2022    9:28 AM 09/09/2022    8:31 AM 08/17/2022   10:14 AM 06/30/2022    9:09 AM  Fall Risk   Falls in the past year? 0 0 0 0 0  Number falls in past yr: 0 0 0 0 0  Injury with Fall? 0 0 0 0 0  Risk for fall due to : No Fall Risks      Follow up Falls prevention discussed;Education provided;Falls evaluation completed        FALL RISK PREVENTION PERTAINING TO THE HOME:  Any stairs in or around the home? No  If so, are there any without handrails? No  Home free of loose throw rugs in walkways, pet beds, electrical cords, etc? Yes  Adequate lighting in your home to reduce risk of falls? Yes   ASSISTIVE DEVICES UTILIZED TO PREVENT FALLS:  Life  alert? No  Use of a cane, walker or w/c? No  Grab bars in the bathroom? Yes  Shower chair or bench in shower? No  Elevated toilet seat or a handicapped toilet? Yes   TIMED UP AND GO:  Was the test performed? No . Telephonic visit   Cognitive Function:        04/16/2023    3:19 PM 06/18/2021    2:04 PM 05/24/2020   11:24 AM  6CIT Screen  What Year? 0 points 0 points 0 points  What month? 0 points 0 points 0 points  What time? 0 points 0 points 0 points  Count back from 20 0 points 0 points 0 points  Months in reverse 0 points 0 points 0 points  Repeat phrase 0 points 0 points 0 points  Total Score 0 points 0 points 0 points    Immunizations Immunization History  Administered Date(s) Administered   Influenza,inj,Quad PF,6+ Mos 07/10/2019   Influenza-Unspecified 08/09/2014, 08/13/2016   PFIZER(Purple Top)SARS-COV-2 Vaccination 11/20/2019, 12/08/2019   Pfizer Covid-19 Vaccine Bivalent Booster 57yrs & up 02/20/2022   Pneumococcal Polysaccharide-23 05/24/2019   Td 05/09/2006   Tdap 01/26/2018   Zoster Recombinat (Shingrix) 07/18/2015, 06/03/2022   Zoster, Live 07/18/2015    TDAP status: Up to date  Pneumococcal vaccine status: Due, Education has been provided regarding the importance of this vaccine. Advised may receive this vaccine at local pharmacy or Health Dept. Aware to provide a copy of the vaccination record if obtained from local pharmacy or Health Dept. Verbalized acceptance and understanding.  Covid-19 vaccine status: Information provided on how to obtain vaccines.   Qualifies for Shingles Vaccine? Yes   Zostavax completed Yes   Shingrix Completed?: Yes  Screening Tests Health Maintenance  Topic Date Due   Pneumonia Vaccine 63+ Years old (2 of 2 - PCV) 05/23/2020   COVID-19 Vaccine (4 - 2023-24 season) 07/10/2022   INFLUENZA VACCINE  06/10/2023   Medicare Annual Wellness (AWV)  04/15/2024   Colonoscopy  06/04/2025   DTaP/Tdap/Td (3 - Td or Tdap) 01/27/2028    Hepatitis C Screening  Completed   Zoster Vaccines- Shingrix  Completed   HPV VACCINES  Aged Out    Health Maintenance  Health Maintenance Due  Topic Date Due   Pneumonia Vaccine 90+ Years old (2 of 2 - PCV) 05/23/2020   COVID-19 Vaccine (4 - 2023-24 season) 07/10/2022    Colorectal cancer screening: Type of screening: Colonoscopy. Completed 06/04/15. Repeat every 10 years  Lung Cancer Screening: (Low Dose CT Chest recommended if Age 44-80 years, 30 pack-year currently smoking OR have quit w/in 15years.) does not qualify.   Lung Cancer Screening Referral: n/a  Additional Screening:  Hepatitis C Screening: does qualify; Completed 01/23/15  Vision Screening: Recommended  annual ophthalmology exams for early detection of glaucoma and other disorders of the eye. Is the patient up to date with their annual eye exam?  Yes  Who is the provider or what is the name of the office in which the patient attends annual eye exams? Unable to provide name If pt is not established with a provider, would they like to be referred to a provider to establish care? No .   Dental Screening: Recommended annual dental exams for proper oral hygiene  Community Resource Referral / Chronic Care Management: CRR required this visit?  No   CCM required this visit?  No      Plan:     I have personally reviewed and noted the following in the patient's chart:   Medical and social history Use of alcohol, tobacco or illicit drugs  Current medications and supplements including opioid prescriptions. Patient is not currently taking opioid prescriptions. Functional ability and status Nutritional status Physical activity Advanced directives List of other physicians Hospitalizations, surgeries, and ER visits in previous 12 months Vitals Screenings to include cognitive, depression, and falls Referrals and appointments  In addition, I have reviewed and discussed with patient certain preventive protocols,  quality metrics, and best practice recommendations. A written personalized care plan for preventive services as well as general preventive health recommendations were provided to patient.     Durwin Nora, [place]   11/14/1094   Due to this being a virtual visit, the after visit summary with patients personalized plan was offered to patient via mail or my-chart.  Patient would like to access on my-chart  Nurse Notes: No concerns

## 2023-04-30 NOTE — Progress Notes (Signed)
Carelink Summary Report / Loop Recorder 

## 2023-05-03 ENCOUNTER — Telehealth: Payer: Self-pay

## 2023-05-03 NOTE — Telephone Encounter (Signed)
Thank you - agree with the changes

## 2023-05-03 NOTE — Telephone Encounter (Signed)
ILR alert for tachy x2, EGM shows some oversensing, underlying tachy 11-24sec in duration, mean HR 171 Hx of AF with RVR, Eliquis per EPIC Not a new finding.  Tachy alert programmed 162 bpm, increase to 176 to decrease non-actionalble alert.  Route to triage for awareness LA, CVRS   Request from [facility] to increase Tachy alert from 162 to 176 to decrease non-actionable alerts.

## 2023-05-04 LAB — CUP PACEART REMOTE DEVICE CHECK
Date Time Interrogation Session: 20240624230716
Implantable Pulse Generator Implant Date: 20240111

## 2023-05-04 NOTE — Telephone Encounter (Signed)
Changes have been made and updated in [facility].

## 2023-05-10 ENCOUNTER — Ambulatory Visit (INDEPENDENT_AMBULATORY_CARE_PROVIDER_SITE_OTHER): Payer: No Typology Code available for payment source

## 2023-05-10 DIAGNOSIS — R55 Syncope and collapse: Secondary | ICD-10-CM

## 2023-05-17 ENCOUNTER — Other Ambulatory Visit: Payer: Self-pay | Admitting: Family Medicine

## 2023-05-17 ENCOUNTER — Other Ambulatory Visit (HOSPITAL_COMMUNITY): Payer: Self-pay

## 2023-05-17 MED ORDER — LOSARTAN POTASSIUM 100 MG PO TABS
100.0000 mg | ORAL_TABLET | Freq: Every day | ORAL | 1 refills | Status: DC
  Filled 2023-05-17: qty 90, 90d supply, fill #0
  Filled 2023-08-12: qty 90, 90d supply, fill #1

## 2023-05-31 NOTE — Progress Notes (Signed)
Carelink Summary Report / Loop Recorder 

## 2023-06-09 ENCOUNTER — Ambulatory Visit: Payer: No Typology Code available for payment source

## 2023-06-09 DIAGNOSIS — R55 Syncope and collapse: Secondary | ICD-10-CM

## 2023-06-11 ENCOUNTER — Other Ambulatory Visit (HOSPITAL_COMMUNITY): Payer: Self-pay

## 2023-06-11 ENCOUNTER — Other Ambulatory Visit: Payer: Self-pay

## 2023-06-11 ENCOUNTER — Ambulatory Visit (INDEPENDENT_AMBULATORY_CARE_PROVIDER_SITE_OTHER): Payer: Self-pay | Admitting: Podiatrist

## 2023-06-11 DIAGNOSIS — E785 Hyperlipidemia, unspecified: Secondary | ICD-10-CM

## 2023-06-11 DIAGNOSIS — B351 Tinea unguium: Secondary | ICD-10-CM

## 2023-06-11 MED ORDER — ATORVASTATIN CALCIUM 40 MG PO TABS
40.0000 mg | ORAL_TABLET | Freq: Every day | ORAL | 1 refills | Status: DC
Start: 2023-06-11 — End: 2023-12-16
  Filled 2023-06-11: qty 90, 90d supply, fill #0
  Filled 2023-09-22: qty 90, 90d supply, fill #1
  Filled 2023-09-22: qty 90, 90d supply, fill #0

## 2023-06-11 NOTE — Progress Notes (Signed)
Patient presents today for the 3rd laser treatment. Diagnosed with mycotic nail infection by Dr. Allena Katz.   Toenail most affected bilateral 1st.  All other systems are negative.  Nails were filed thin. Laser therapy was administered to 1-5 toenails bilateral and patient tolerated the treatment well. All safety precautions were in place.      Follow up in 4 weeks for laser # 4

## 2023-06-12 ENCOUNTER — Other Ambulatory Visit: Payer: Self-pay | Admitting: Family Medicine

## 2023-06-14 ENCOUNTER — Other Ambulatory Visit (HOSPITAL_COMMUNITY): Payer: Self-pay

## 2023-06-14 ENCOUNTER — Ambulatory Visit: Payer: No Typology Code available for payment source

## 2023-06-14 MED ORDER — SILDENAFIL CITRATE 50 MG PO TABS
100.0000 mg | ORAL_TABLET | Freq: Every day | ORAL | 2 refills | Status: DC | PRN
  Filled 2023-06-14: qty 10, 5d supply, fill #0
  Filled 2024-02-06: qty 10, 5d supply, fill #1
  Filled 2024-03-26 – 2024-04-05 (×2): qty 10, 5d supply, fill #2

## 2023-06-22 ENCOUNTER — Other Ambulatory Visit (HOSPITAL_COMMUNITY): Payer: Self-pay

## 2023-06-24 NOTE — Progress Notes (Addendum)
Summary Report / Loop Recorder

## 2023-06-30 ENCOUNTER — Other Ambulatory Visit (HOSPITAL_COMMUNITY): Payer: Self-pay

## 2023-07-06 ENCOUNTER — Ambulatory Visit (INDEPENDENT_AMBULATORY_CARE_PROVIDER_SITE_OTHER): Payer: No Typology Code available for payment source | Admitting: Podiatry

## 2023-07-06 DIAGNOSIS — B351 Tinea unguium: Secondary | ICD-10-CM

## 2023-07-06 NOTE — Progress Notes (Signed)
Debride nails and laser of nails 1-5 bilateral

## 2023-07-13 LAB — CUP PACEART REMOTE DEVICE CHECK
Date Time Interrogation Session: 20240902231014
Implantable Pulse Generator Implant Date: 20240111

## 2023-07-19 ENCOUNTER — Ambulatory Visit: Payer: No Typology Code available for payment source

## 2023-07-19 ENCOUNTER — Other Ambulatory Visit (HOSPITAL_COMMUNITY): Payer: Self-pay

## 2023-07-19 DIAGNOSIS — R55 Syncope and collapse: Secondary | ICD-10-CM | POA: Diagnosis not present

## 2023-07-28 ENCOUNTER — Ambulatory Visit
Admission: EM | Admit: 2023-07-28 | Discharge: 2023-07-28 | Disposition: A | Discharge status: Home / Self Care | Payer: No Typology Code available for payment source | Attending: Internal Medicine | Admitting: Internal Medicine

## 2023-07-28 DIAGNOSIS — B349 Viral infection, unspecified: Secondary | ICD-10-CM

## 2023-07-28 DIAGNOSIS — U071 COVID-19: Secondary | ICD-10-CM | POA: Insufficient documentation

## 2023-07-28 DIAGNOSIS — R059 Cough, unspecified: Secondary | ICD-10-CM | POA: Diagnosis present

## 2023-07-28 MED ORDER — CETIRIZINE HCL 10 MG PO TABS: 10.0000 mg | ORAL_TABLET | Freq: Every day | ORAL | 0 refills | Status: DC

## 2023-07-28 MED ORDER — ACETAMINOPHEN 325 MG PO TABS: 650.0000 mg | ORAL_TABLET | Freq: Four times a day (QID) | ORAL | 0 refills | Status: DC | PRN

## 2023-07-28 MED ORDER — PROMETHAZINE-DM 6.25-15 MG/5ML PO SYRP: 5.0000 mL | ORAL_SOLUTION | Freq: Three times a day (TID) | ORAL | 0 refills | Status: DC | PRN

## 2023-07-28 NOTE — Discharge Instructions (Signed)
We will notify you of your test results as they arrive and may take between about 24 hours.  I encourage you to sign up for MyChart if you have not already done so as this can be the easiest way for [place] to communicate results to you online or through a phone app.  Generally, we only contact you if it is a positive test result.  In the meantime, if you develop worsening symptoms including fever, chest pain, shortness of breath despite our current treatment plan then please report to the emergency room as this may be a sign of worsening status from possible viral infection.  Otherwise, we will manage this as a viral syndrome. For sore throat or cough try using a honey-based tea. Use 3 teaspoons of honey with juice squeezed from half lemon. Place shaved pieces of ginger into 1/2-1 cup of water and warm over stove top. Then mix the ingredients and repeat every 4 hours as needed. Please take Tylenol 500mg -650mg  every 6 hours for aches and pains, fevers. Hydrate very well with at least 2 liters of water. Eat light meals such as soups to replenish electrolytes and soft fruits, veggies. Start an antihistamine like Zyrtec (10mg  daily) for postnasal drainage, sinus congestion.   Use the cough medications as needed.

## 2023-07-28 NOTE — ED Provider Notes (Signed)
[facility]  Note:  This document was prepared using Conservation officer, historic buildings and may include unintentional dictation errors.  MRN: [id number] DOB: [birth year]  Subjective:   Wayne Molina is a 67 y.o. male presenting for 2-day history of acute onset dry cough, chest congestion, chills, body aches, drainage.  No chest pain, shortness of breath or wheezing.  No history of asthma.  No smoking of any kind including cigarettes, cigars, vaping, marijuana use.  He does work at [employer], has exposure there would like to be tested for COVID-19.  No current facility-administered medications for this encounter.  Current Outpatient Medications:    acetaminophen (TYLENOL) 325 MG tablet, Take 2 tablets (650 mg total) by mouth every 6 (six) hours as needed for moderate pain., Disp: 30 tablet, Rfl: 0   amLODipine (NORVASC) 10 MG tablet, Take 1 tablet (10 mg total) by mouth daily., Disp: 90 tablet, Rfl: 0   apixaban (ELIQUIS) 5 MG TABS tablet, Take 1 tablet (5 mg total) by mouth 2 (two) times daily., Disp: 60 tablet, Rfl: 6   atorvastatin (LIPITOR) 40 MG tablet, Take 1 tablet (40 mg total) by mouth daily., Disp: 90 tablet, Rfl: 1   calcium carbonate (OSCAL) 1500 (600 Ca) MG TABS tablet, Take by mouth 2 (two) times daily with a meal., Disp: , Rfl:    Cyanocobalamin (VITAMIN B-12 PO), Take 1 capsule by mouth daily., Disp: , Rfl:    losartan (COZAAR) 100 MG tablet, Take 1 tablet (100 mg total) by mouth daily., Disp: 90 tablet, Rfl: 1   Magnesium 250 MG TABS, Take 1 tablet by mouth daily., Disp: , Rfl:    Magnesium Oxide 250 MG TABS, Take 1 tablet by mouth daily in the afternoon., Disp: , Rfl:    Multiple Vitamin (MULTIVITAMIN ADULT PO), Take 1 tablet by mouth daily., Disp: , Rfl:    MULTIPLE VITAMIN PO, Take 1 tablet by mouth daily., Disp: , Rfl:    Potassium Gluconate 2 MEQ TABS, Take 1 tablet by mouth daily in the afternoon., Disp: , Rfl:    POTASSIUM PO, Take 1  tablet by mouth daily., Disp: , Rfl:    sildenafil (VIAGRA) 50 MG tablet, Take 2 tablets (100 mg total) by mouth daily as needed., Disp: 10 tablet, Rfl: 2   No Known Allergies  Past Medical History:  Diagnosis Date   Anxiety    Arthritis    fingers   Chronic atrial fibrillation (HCC)    Depression    Family history of breast cancer    Family history of cancer of male genital organ    Family history of gene mutation    BRIP1   Family history of malignant neoplasm of gastrointestinal tract    Hyperlipidemia    Hypertension    Personal history of colonic polyps 11/12/2006   Sleep apnea      Past Surgical History:  Procedure Laterality Date   2-D echocardiogram  07/22/2010   Ejection fraction greater than 55%. Left atrium moderately dilated. Right atrium moderately dilated. 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 Mild to Moderate MR. Mild to moderate TR.   ATRIAL ABLATION SURGERY  2004   EP IMPLANTABLE DEVICE N/A 02/11/2016   Procedure: Loop Recorder Insertion;  Surgeon: Thurmon Fair, MD;  Location: [facility];  Service: Cardiovascular;  Laterality: N/A;   Exercise Myoview stress test  07/08/2000   Nonischemic low-risk.   HIP FRACTURE SURGERY  1970s   IR GENERIC HISTORICAL  01/21/2017  IR RADIOLOGIST EVAL & MGMT 01/21/2017 [facility]    Family History  Problem Relation Age of Onset   Dementia Mother    Heart disease Father    Diabetes Father    Other Sister        BRIP1 gene mutation   Kidney disease Brother    Colon cancer Maternal Uncle    Lung cancer Paternal Aunt        d. >50   Cancer Paternal Aunt        unknown type, d. >50   Cancer Cousin 17       gynecologic (paternal first cousin)   Colon cancer Nephew 27       arising in colon polyp   Cancer Niece 20       gynecologic; niece in her 97's   Breast cancer Niece 46   Other Niece        BRIP1 gene mutation   Cancer Paternal Great-grandmother        abdominal cancer (PGF's mother) great grandmother     Social History   Tobacco Use   Smoking status: Never   Smokeless tobacco: Never  Vaping Use   Vaping status: Never Used  Substance Use Topics   Alcohol use: No   Drug use: No    ROS   Objective:   Vitals: BP (!) 149/86 (BP Location: Left Arm)   Pulse 83   Temp 98.7 F (37.1 C) (Oral)   Resp 16   SpO2 97%   Physical Exam Constitutional:      General: He is not in acute distress.    Appearance: Normal appearance. He is well-developed and normal weight. He is not ill-appearing, toxic-appearing or diaphoretic.  HENT:     Head: Normocephalic and atraumatic.     Right Ear: External ear normal.     Left Ear: External ear normal.     Nose: Nose normal.     Mouth/Throat:     Mouth: Mucous membranes are moist.     Pharynx: No pharyngeal swelling, oropharyngeal exudate, posterior oropharyngeal erythema or uvula swelling.     Tonsils: No tonsillar exudate or tonsillar abscesses. 0 on the right. 0 on the left.  Eyes:     General: No scleral icterus.       Right eye: No discharge.        Left eye: No discharge.     Extraocular Movements: Extraocular movements intact.  Cardiovascular:     Rate and Rhythm: Normal rate and regular rhythm.     Heart sounds: Normal heart sounds. No murmur heard.    No friction rub. No gallop.  Pulmonary:     Effort: Pulmonary effort is normal. No respiratory distress.     Breath sounds: Normal breath sounds. No stridor. No wheezing, rhonchi or rales.  Musculoskeletal:     Cervical back: Normal range of motion.  Neurological:     Mental Status: He is alert and oriented to person, place, and time.  Psychiatric:        Mood and Affect: Mood normal.        Behavior: Behavior normal.        Thought Content: Thought content normal.        Judgment: Judgment normal.     Assessment and Plan :   PDMP not reviewed this encounter.  1. Acute viral syndrome    Deferred imaging given clear cardiopulmonary exam, hemodynamically stable vital  signs. Will manage for viral illness such as viral  URI, viral syndrome, viral rhinitis, COVID-19. Recommended supportive care. Offered scripts for symptomatic relief. Testing is pending. Counseled patient on potential for adverse effects with medications prescribed/recommended today, ER and return-to-clinic precautions discussed, patient verbalized understanding.   Patient has risk factors and his age, heart history including atrial fibrillation, cardiomyopathy, hypertension, syncope and would benefit from taking COVID antivirals.  Specifically he could do molnupiravir if he tests positive.   Wallis Bamberg, [place] 07/28/23 [phone]

## 2023-07-28 NOTE — ED Triage Notes (Signed)
Pt c/o dry cough, chest congestion, chills sx started 9/16-taking OTC cold meds/rx cough med/tylenol and motrin-NAD-steady gait

## 2023-07-29 LAB — SARS CORONAVIRUS 2 (TAT 6-24 HRS): SARS Coronavirus 2: POSITIVE — AB

## 2023-07-30 ENCOUNTER — Telehealth: Payer: Self-pay

## 2023-07-30 MED ORDER — MOLNUPIRAVIR 200 MG PO CAPS: 4.0000 | ORAL_CAPSULE | Freq: Two times a day (BID) | ORAL | 0 refills | Status: AC

## 2023-07-31 ENCOUNTER — Telehealth: Payer: Self-pay | Admitting: Emergency Medicine

## 2023-07-31 NOTE — Telephone Encounter (Signed)
[facility] pharmacy called stating that insurance doesn't cover the prescription sent in yesterday for Covid but could change the Rx to Paxlovid. Got verbal ok from Gwendolyn Fill., PA that can change RX. Gave verbal order over the phone to pharmacist to change prescription.

## 2023-08-05 NOTE — Progress Notes (Signed)
Carelink Summary Report / Loop Recorder 

## 2023-08-12 ENCOUNTER — Encounter: Payer: Self-pay | Admitting: Family Medicine

## 2023-08-16 ENCOUNTER — Ambulatory Visit: Payer: No Typology Code available for payment source

## 2023-08-20 ENCOUNTER — Other Ambulatory Visit (HOSPITAL_COMMUNITY): Payer: Self-pay

## 2023-08-23 ENCOUNTER — Ambulatory Visit (INDEPENDENT_AMBULATORY_CARE_PROVIDER_SITE_OTHER): Payer: No Typology Code available for payment source

## 2023-08-23 ENCOUNTER — Ambulatory Visit: Payer: No Typology Code available for payment source

## 2023-08-23 DIAGNOSIS — R55 Syncope and collapse: Secondary | ICD-10-CM | POA: Diagnosis not present

## 2023-08-23 LAB — CUP PACEART REMOTE DEVICE CHECK
Date Time Interrogation Session: 20241013231022
Implantable Pulse Generator Implant Date: 20240111

## 2023-09-08 NOTE — Progress Notes (Signed)
Carelink Summary Report / Loop Recorder 

## 2023-09-20 ENCOUNTER — Ambulatory Visit: Payer: No Typology Code available for payment source

## 2023-09-22 ENCOUNTER — Other Ambulatory Visit: Payer: Self-pay

## 2023-09-22 ENCOUNTER — Other Ambulatory Visit: Payer: Self-pay | Admitting: Cardiovascular Disease

## 2023-09-22 ENCOUNTER — Other Ambulatory Visit (HOSPITAL_COMMUNITY): Payer: Self-pay

## 2023-09-22 MED ORDER — APIXABAN 5 MG PO TABS
5.0000 mg | ORAL_TABLET | Freq: Two times a day (BID) | ORAL | 5 refills | Status: DC
  Filled 2023-09-22: qty 60, 30d supply, fill #0
  Filled 2023-10-28: qty 60, 30d supply, fill #1
  Filled 2023-11-25: qty 60, 30d supply, fill #2
  Filled 2023-12-25: qty 60, 30d supply, fill #3
  Filled 2024-01-26: qty 60, 30d supply, fill #4
  Filled 2024-02-22: qty 60, 30d supply, fill #5

## 2023-09-22 NOTE — Telephone Encounter (Signed)
Prescription refill request for Eliquis received. Indication: AF Last office visit:  11/19/22  Judie Petit Croitoru MD Scr: 1.12 on 08/09/22  Epic Age:  67 Weight: 78.7kg  Based on above findings Eliquis 5mg  twice daily is the appropriate dose.  Refill approved.

## 2023-09-23 ENCOUNTER — Other Ambulatory Visit (HOSPITAL_COMMUNITY): Payer: Self-pay

## 2023-09-27 ENCOUNTER — Ambulatory Visit (INDEPENDENT_AMBULATORY_CARE_PROVIDER_SITE_OTHER): Payer: No Typology Code available for payment source

## 2023-09-27 ENCOUNTER — Ambulatory Visit: Payer: No Typology Code available for payment source

## 2023-09-27 DIAGNOSIS — R55 Syncope and collapse: Secondary | ICD-10-CM

## 2023-09-27 LAB — CUP PACEART REMOTE DEVICE CHECK
Date Time Interrogation Session: 20241117232424
Implantable Pulse Generator Implant Date: 20240111

## 2023-10-19 ENCOUNTER — Encounter: Payer: Self-pay | Admitting: Family Medicine

## 2023-10-22 NOTE — Progress Notes (Signed)
Carelink Summary Report / Loop Recorder 

## 2023-10-25 ENCOUNTER — Ambulatory Visit: Payer: No Typology Code available for payment source

## 2023-10-28 ENCOUNTER — Other Ambulatory Visit (HOSPITAL_COMMUNITY): Payer: Self-pay

## 2023-11-01 ENCOUNTER — Ambulatory Visit: Payer: No Typology Code available for payment source

## 2023-11-01 ENCOUNTER — Ambulatory Visit (INDEPENDENT_AMBULATORY_CARE_PROVIDER_SITE_OTHER): Payer: No Typology Code available for payment source

## 2023-11-01 DIAGNOSIS — R55 Syncope and collapse: Secondary | ICD-10-CM

## 2023-11-02 LAB — CUP PACEART REMOTE DEVICE CHECK
Date Time Interrogation Session: 20241222232209
Implantable Pulse Generator Implant Date: 20240111

## 2023-11-07 ENCOUNTER — Other Ambulatory Visit: Payer: Self-pay | Admitting: Family Medicine

## 2023-11-08 ENCOUNTER — Other Ambulatory Visit (HOSPITAL_COMMUNITY): Payer: Self-pay

## 2023-11-08 MED ORDER — LOSARTAN POTASSIUM 100 MG PO TABS
100.0000 mg | ORAL_TABLET | Freq: Every day | ORAL | 0 refills | Status: DC
  Filled 2023-11-08: qty 90, 90d supply, fill #0

## 2023-11-25 ENCOUNTER — Other Ambulatory Visit (HOSPITAL_COMMUNITY): Payer: Self-pay

## 2023-12-06 ENCOUNTER — Ambulatory Visit (INDEPENDENT_AMBULATORY_CARE_PROVIDER_SITE_OTHER): Payer: No Typology Code available for payment source

## 2023-12-06 DIAGNOSIS — R55 Syncope and collapse: Secondary | ICD-10-CM

## 2023-12-06 LAB — CUP PACEART REMOTE DEVICE CHECK
Date Time Interrogation Session: 20250126232153
Implantable Pulse Generator Implant Date: 20240111

## 2023-12-11 ENCOUNTER — Encounter: Payer: Self-pay | Admitting: Cardiovascular Disease

## 2023-12-13 NOTE — Addendum Note (Signed)
Addended by: Geralyn Flash D on: 12/13/2023 10:05 AM   Modules accepted: Orders

## 2023-12-13 NOTE — Progress Notes (Signed)
 [facility] Summary Report / Loop Recorder

## 2023-12-16 ENCOUNTER — Other Ambulatory Visit: Payer: Self-pay | Admitting: Family Medicine

## 2023-12-16 ENCOUNTER — Other Ambulatory Visit (HOSPITAL_COMMUNITY): Payer: Self-pay

## 2023-12-16 DIAGNOSIS — E785 Hyperlipidemia, unspecified: Secondary | ICD-10-CM

## 2023-12-16 MED ORDER — ATORVASTATIN CALCIUM 40 MG PO TABS
40.0000 mg | ORAL_TABLET | Freq: Every day | ORAL | 1 refills | Status: DC
  Filled 2023-12-16: qty 90, 90d supply, fill #0
  Filled 2024-04-05: qty 90, 90d supply, fill #1

## 2023-12-27 ENCOUNTER — Other Ambulatory Visit (HOSPITAL_COMMUNITY): Payer: Self-pay

## 2024-01-10 ENCOUNTER — Ambulatory Visit (INDEPENDENT_AMBULATORY_CARE_PROVIDER_SITE_OTHER): Payer: No Typology Code available for payment source

## 2024-01-10 DIAGNOSIS — R55 Syncope and collapse: Secondary | ICD-10-CM

## 2024-01-12 ENCOUNTER — Encounter: Payer: Self-pay | Admitting: Cardiovascular Disease

## 2024-01-12 LAB — CUP PACEART REMOTE DEVICE CHECK
Date Time Interrogation Session: 20250302231449
Implantable Pulse Generator Implant Date: 20240111

## 2024-01-17 NOTE — Progress Notes (Signed)
 [facility] Summary Report / Loop Recorder

## 2024-01-26 ENCOUNTER — Other Ambulatory Visit (HOSPITAL_COMMUNITY): Payer: Self-pay

## 2024-02-06 ENCOUNTER — Other Ambulatory Visit: Payer: Self-pay | Admitting: Family Medicine

## 2024-02-07 ENCOUNTER — Other Ambulatory Visit: Payer: Self-pay

## 2024-02-07 ENCOUNTER — Other Ambulatory Visit (HOSPITAL_COMMUNITY): Payer: Self-pay

## 2024-02-07 MED ORDER — LOSARTAN POTASSIUM 100 MG PO TABS
100.0000 mg | ORAL_TABLET | Freq: Every day | ORAL | 0 refills | Status: DC
  Filled 2024-02-07: qty 90, 90d supply, fill #0

## 2024-02-14 ENCOUNTER — Ambulatory Visit (INDEPENDENT_AMBULATORY_CARE_PROVIDER_SITE_OTHER): Payer: No Typology Code available for payment source

## 2024-02-14 DIAGNOSIS — R55 Syncope and collapse: Secondary | ICD-10-CM | POA: Diagnosis not present

## 2024-02-14 NOTE — Progress Notes (Signed)
 [facility] Summary Report / Loop Recorder

## 2024-02-14 NOTE — Addendum Note (Signed)
 Addended by: Geralyn Flash D on: 02/14/2024 09:16 AM   Modules accepted: Orders

## 2024-02-15 LAB — CUP PACEART REMOTE DEVICE CHECK
Date Time Interrogation Session: 20250406231547
Implantable Pulse Generator Implant Date: 20240111

## 2024-02-16 ENCOUNTER — Encounter: Payer: Self-pay | Admitting: Cardiovascular Disease

## 2024-02-21 ENCOUNTER — Ambulatory Visit: Admitting: Student

## 2024-02-21 NOTE — Progress Notes (Deleted)
  SUBJECTIVE:   CHIEF COMPLAINT / HPI:   ***  PERTINENT  PMH / PSH: HTN, HLD, A-fib on Eliquis, osteopenia, sleep apnea, ED  OBJECTIVE:  There were no vitals taken for this visit. ***  ASSESSMENT/PLAN:   Assessment & Plan  No follow-ups on file. Veronia Goon, DO 02/21/2024, 7:57 AM PGY-***, [facility] {    This will disappear when note is signed, click to select method of visit    :1}

## 2024-03-07 ENCOUNTER — Telehealth: Payer: Self-pay

## 2024-03-07 ENCOUNTER — Encounter: Payer: Self-pay | Admitting: Student

## 2024-03-07 ENCOUNTER — Ambulatory Visit: Admitting: Student

## 2024-03-07 VITALS — BP 124/75 | HR 77 | Temp 97.7°F | Wt 168.4 lb

## 2024-03-07 DIAGNOSIS — R1084 Generalized abdominal pain: Secondary | ICD-10-CM

## 2024-03-07 DIAGNOSIS — R739 Hyperglycemia, unspecified: Secondary | ICD-10-CM | POA: Diagnosis not present

## 2024-03-07 DIAGNOSIS — I10 Essential (primary) hypertension: Secondary | ICD-10-CM

## 2024-03-07 DIAGNOSIS — M546 Pain in thoracic spine: Secondary | ICD-10-CM | POA: Diagnosis not present

## 2024-03-07 DIAGNOSIS — Z23 Encounter for immunization: Secondary | ICD-10-CM

## 2024-03-07 DIAGNOSIS — E785 Hyperlipidemia, unspecified: Secondary | ICD-10-CM | POA: Diagnosis not present

## 2024-03-07 DIAGNOSIS — Z720 Tobacco use: Secondary | ICD-10-CM | POA: Diagnosis not present

## 2024-03-07 DIAGNOSIS — R079 Chest pain, unspecified: Secondary | ICD-10-CM | POA: Diagnosis not present

## 2024-03-07 LAB — POCT GLYCOSYLATED HEMOGLOBIN (HGB A1C): Hemoglobin A1C: 5.6 % (ref 4.0–5.6)

## 2024-03-07 NOTE — Telephone Encounter (Signed)
-----   Message from [facility] Genevia Kern S sent at 03/07/2024 10:15 AM EDT ----- Ok to schedule US  at [facility].  Thanks! Clovis Dar

## 2024-03-07 NOTE — Assessment & Plan Note (Signed)
 History concerning for angina, recommend holding sildenafil .  Advised to call cardiologist to establish appointment within the next week.  EKG shows marked T wave abnormality however this is similar to his prior EKGs.  Order echo today.  ED precautions discussed.

## 2024-03-07 NOTE — Patient Instructions (Addendum)
 It was great to see you today! Thank you for choosing [facility] for your primary care.  Today we addressed: You were given the pneumonia vaccine today. A1c 5.6, this is normal. Chest pain: Please schedule an appoint with your cardiologist within the next 1 to 2 weeks.  Stop using your sildenafil .  I have ordered an echo and also aorta screening given your brief history of smoking.  Should you experience chest pain that lasts longer than 5 minutes or radiates to your arm or up your neck, please call EMS.  If you haven't already, sign up for My Chart to have easy access to your labs results, and communication with your primary care physician. We are checking some labs today. If they are abnormal, I will call you. If they are normal, I will send you a MyChart message (if it is active) or a letter in the mail. If you do not hear about your labs in the next 2 weeks, please call the office.  Please arrive 15 minutes before your appointment to ensure smooth check in process.  We appreciate your efforts in making this happen.  Thank you for allowing me to participate in your care, Wayne Goon, DO 03/07/2024, 10:05 AM PGY-3, [facility]

## 2024-03-07 NOTE — Assessment & Plan Note (Signed)
 BP: 124/75 today. Well controlled. Goal of <140/90. Medication regimen: Losartan  100 mg daily.  Check CMP.

## 2024-03-07 NOTE — Telephone Encounter (Signed)
 Called radiology scheduling and spoke with Bartholomew Light to schedule an US  appointment.  Schedule appointment for Thursday, May 1st at 8am, check in time will be at 7:45am. Nothing to eat or drink after Midnight.  Called patient to inform him about upcoming appointment for his US .  Provided the appointment information to the patient.  Patient understood information provided.  Christ Courier, CMA

## 2024-03-07 NOTE — Progress Notes (Signed)
  SUBJECTIVE:   CHIEF COMPLAINT / HPI:   Chest pain: Feels chest pain in his chest after having orgasm lasting 2-3 minutes in addition to palpitations. Notes this is when he does not use sildenafil . Sildenafil  does not help with his erections or arousal. Denies palpitations or chest pain otherwise. He has mentioned this to Dr. Alvis Ba. He denies any radiating pain to left arm or neck.   Stomach issues. Sometimes needs to strain to go to the bathroom. Most days will be able to have Bms 2-3x/daily. No diarrhea. No change in caliber of stool.   Back pain: He notes thoracic back pain which started 8 days ago but is improving.  He works second shift and does some lifting suspects it was related to that.  He has been using ice and medications as needed which have helped.  He does not drink alcohol. Smoked for approximately 6 months of his life.   PERTINENT  PMH / PSH: HTN, A-fib on Eliquis , HLD, osteoporosis  OBJECTIVE:  BP 124/75   Pulse 77   Temp 97.7 F (36.5 C)   Wt 168 lb 6.4 oz (76.4 kg)   SpO2 97%   BMI 25.61 kg/m  General: Well-appearing, NAD CV: Irregularly irregular, no murmurs appreciable Pulm: CTAB, normal WOB Abdomen: Soft, nontender, normoactive bowel sounds, no hepatomegaly MSK: No reproducible palpation of the thoracic and lumbar spine, full range of motion  ASSESSMENT/PLAN:   Assessment & Plan Chest pain, unspecified type History concerning for angina, recommend holding sildenafil .  Advised to call cardiologist to establish appointment within the next week.  EKG shows marked T wave abnormality however this is similar to his prior EKGs.  Order echo today.  ED precautions discussed. Hyperlipidemia, unspecified hyperlipidemia type Continue atorvastatin  40 mg daily, recheck lipid panel. Essential hypertension BP: 124/75 today. Well controlled. Goal of <140/90. Medication regimen: Losartan  100 mg daily.  Check CMP. Hyperglycemia A1c 5.6, normal. Tobacco use Distant  history of smoking for approximately 6 months.  Meets criteria for aortic aneurysm ultrasound screening. Acute thoracic back pain, unspecified back pain laterality Improving, physical exam unremarkable, suspect MSK strain related to work.  Return as needed if not improving after 4 weeks. Generalized abdominal pain Potentially GERD, monitor for now.  Recommend return to discuss should this not resolve. Return if symptoms worsen or fail to improve. Veronia Goon, DO 03/07/2024, 10:16 AM PGY-3,  Family Medicine

## 2024-03-07 NOTE — Assessment & Plan Note (Signed)
 Continue atorvastatin  40 mg daily, recheck lipid panel.

## 2024-03-08 LAB — COMPREHENSIVE METABOLIC PANEL WITH GFR
ALT: 26 IU/L (ref 0–44)
AST: 29 IU/L (ref 0–40)
Albumin: 4.7 g/dL (ref 3.9–4.9)
Alkaline Phosphatase: 60 IU/L (ref 44–121)
BUN/Creatinine Ratio: 16 (ref 10–24)
BUN: 17 mg/dL (ref 8–27)
Bilirubin Total: 1.5 mg/dL — ABNORMAL HIGH (ref 0.0–1.2)
CO2: 25 mmol/L (ref 20–29)
Calcium: 10.5 mg/dL — ABNORMAL HIGH (ref 8.6–10.2)
Chloride: 101 mmol/L (ref 96–106)
Creatinine, Ser: 1.08 mg/dL (ref 0.76–1.27)
Globulin, Total: 2.4 g/dL (ref 1.5–4.5)
Glucose: 93 mg/dL (ref 70–99)
Potassium: 4.8 mmol/L (ref 3.5–5.2)
Sodium: 139 mmol/L (ref 134–144)
Total Protein: 7.1 g/dL (ref 6.0–8.5)
eGFR: 74 mL/min/{1.73_m2} (ref >59)

## 2024-03-08 LAB — LIPID PANEL
Chol/HDL Ratio: 3.2 ratio (ref 0.0–5.0)
Cholesterol, Total: 132 mg/dL (ref 100–199)
HDL: 41 mg/dL (ref >39)
LDL Chol Calc (NIH): 64 mg/dL (ref 0–99)
Triglycerides: 157 mg/dL — ABNORMAL HIGH (ref 0–149)
VLDL Cholesterol Cal: 27 mg/dL (ref 5–40)

## 2024-03-09 ENCOUNTER — Ambulatory Visit (HOSPITAL_COMMUNITY)
Admission: RE | Admit: 2024-03-09 | Discharge: 2024-03-09 | Disposition: A | Discharge status: Home / Self Care | Source: Ambulatory Visit | Attending: Family Medicine | Admitting: Family Medicine

## 2024-03-09 DIAGNOSIS — Z136 Encounter for screening for cardiovascular disorders: Secondary | ICD-10-CM | POA: Diagnosis not present

## 2024-03-09 DIAGNOSIS — Z720 Tobacco use: Secondary | ICD-10-CM | POA: Diagnosis present

## 2024-03-09 DIAGNOSIS — Z87891 Personal history of nicotine dependence: Secondary | ICD-10-CM | POA: Insufficient documentation

## 2024-03-10 ENCOUNTER — Encounter: Payer: Self-pay | Admitting: Student

## 2024-03-14 ENCOUNTER — Encounter: Payer: Self-pay | Admitting: Cardiovascular Disease

## 2024-03-14 ENCOUNTER — Encounter: Payer: Self-pay | Admitting: Student

## 2024-03-14 ENCOUNTER — Other Ambulatory Visit (HOSPITAL_COMMUNITY): Payer: Self-pay

## 2024-03-14 ENCOUNTER — Ambulatory Visit: Attending: Cardiovascular Disease | Admitting: Cardiovascular Disease

## 2024-03-14 VITALS — BP 131/84 | HR 70 | Ht 69.0 in | Wt 169.4 lb

## 2024-03-14 DIAGNOSIS — Z87898 Personal history of other specified conditions: Secondary | ICD-10-CM | POA: Diagnosis not present

## 2024-03-14 DIAGNOSIS — E7800 Pure hypercholesterolemia, unspecified: Secondary | ICD-10-CM | POA: Diagnosis not present

## 2024-03-14 DIAGNOSIS — I10 Essential (primary) hypertension: Secondary | ICD-10-CM | POA: Diagnosis not present

## 2024-03-14 DIAGNOSIS — F40240 Claustrophobia: Secondary | ICD-10-CM | POA: Diagnosis not present

## 2024-03-14 DIAGNOSIS — I422 Other hypertrophic cardiomyopathy: Secondary | ICD-10-CM | POA: Diagnosis not present

## 2024-03-14 DIAGNOSIS — I4821 Permanent atrial fibrillation: Secondary | ICD-10-CM

## 2024-03-14 DIAGNOSIS — G4733 Obstructive sleep apnea (adult) (pediatric): Secondary | ICD-10-CM

## 2024-03-14 DIAGNOSIS — R072 Precordial pain: Secondary | ICD-10-CM

## 2024-03-14 DIAGNOSIS — R079 Chest pain, unspecified: Secondary | ICD-10-CM | POA: Diagnosis not present

## 2024-03-14 MED ORDER — DIAZEPAM 2 MG PO TABS
ORAL_TABLET | ORAL | 0 refills | Status: DC
  Filled 2024-03-14: qty 1, 1d supply, fill #0

## 2024-03-14 NOTE — Progress Notes (Signed)
 Patient ID: Wayne Molina, male   DOB: April 25, 1956, 68 y.o.   MRN: [id number]    Cardiology Office Note    Date:  03/14/2024   ID:  Wayne, Molina 1956/06/10, MRN [id number]  PCP:  Veronia Goon, DO  Cardiologist:   Luana Rumple, MD   No chief complaint on file.   History of Present Illness:  Wayne Molina is a 68 y.o. male with apical variant hypertrophic cardiomyopathy, long-standing permanent atrial fibrillation with spontaneously controlled ventricular response and an episode of syncope that occurred more than 7 years ago. A loop recorder was implanted in April 2017 and during 3 years of monitoring did not show arrhythmia that could be clearly associated with syncope (frequent nocturnal 3-4-second pauses, never during daytime, never symptomatic).  That loop recorder reached RRT in late 2020 but was reimplanted in January 2024 when he had another episode of near syncope (since then similar pattern of nocturnal pauses up to 4 seconds in duration).  He has not had syncope since loop recorder reimplantation.  He had normal coronary arteries by cardiac catheterization in 2001 and a normal nuclear stress test in 2011.  With permanent atrial fibrillation he would have moderate nocturnal bradycardia to the 30s-40s, as well as frequent episodes of nocturnal pauses lasting over 3 seconds so we really set the device to a threshold of 4-second pauses and these have not occurred.  His device has not showed any evidence of wide-complex tachycardia.  Rate control for atrial fibrillation seems to be optimal, although he is not taking any rate control medications.  He has not had any episodes of dizziness, syncope or lightheadedness recently.  He has not had any falls or injuries and continues to take Eliquis  without serious bleeding complications other than easy bruising.  He has never had a stroke or other embolic event.  Over the last several months he has developed episodes of strong palpitations and  chest pressure during sexual intercourse.  Interestingly, these do not occur if he has taken sildenafil  prior to intercourse, but they occur at other occasions.  If he stops to rest the symptoms subside within a matter of 2 minutes or so.  He does not have similar discomfort when he goes to the gym where he still lifts weights and walks on the treadmill at least 3 times a week.  He has a history of osteopenia and has had small vertebral fractures associated with sneezing and coughing as well as 1/5 metatarsal fracture.  He is taking a bisphosphonate.  His echocardiogram shows findings concerning for apical variant hypertrophic cardiomyopathy as well as severe biatrial dilation.  He has severe claustrophobia after falling asleep and feeling trapped in a tanning bed.  Attempts to perform an MRI in the past were unsuccessful.  He has known obstructive sleep apnea, but has never been able to tolerate CPAP, despite attempting different shapes and sizes of masks.  His BMI is only 25.  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  He had cavotricuspid isthmus radiofrequency ablation for atrial flutter in 2006.  Canan has subsequently developed permanent atrial fibrillation with spontaneously controlled ventricular response. He has long had an abnormal ECG suggestive of hypertrophic cardiomyopathy but without evidence of any structural abnormalities of the left ventricle by conventional imaging methods. He has biatrial dilatation by echocardiography. He has normal left ventricular systolic function.  He has mild to moderate mitral and tricuspid insufficiency, unlikely to have a causal relationship  with his atrial fibrillation. He has previously been diagnosed with sleep apnea but has been unable to tolerate CPAP. He does not have any symptoms of hypersomnolence. He has not had any focal neurological events and has never had embolic complications from his chronic atrial fibrillation.  He has  normal left ventricular systolic function and had a normal nuclear stress test in the past.  Past Medical History:  Diagnosis Date   Anxiety    Arthritis    fingers   Chronic atrial fibrillation (HCC)    Depression    Family history of breast cancer    Family history of cancer of male genital organ    Family history of gene mutation    BRIP1   Family history of malignant neoplasm of gastrointestinal tract    Hyperlipidemia    Hypertension    Personal history of colonic polyps 11/12/2006   Sleep apnea     Past Surgical History:  Procedure Laterality Date   2-D echocardiogram  07/22/2010   Ejection fraction greater than 55%. Left atrium moderately dilated. Right atrium moderately dilated. 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 Mild to Moderate MR. Mild to moderate TR.   ATRIAL ABLATION SURGERY  2004   EP IMPLANTABLE DEVICE N/A 02/11/2016   Procedure: Loop Recorder Insertion;  Surgeon: Luana Rumple, MD;  Location: [facility];  Service: Cardiovascular;  Laterality: N/A;   Exercise Myoview stress test  07/08/2000   Nonischemic low-risk.   HIP FRACTURE SURGERY  1970s   IR GENERIC HISTORICAL  01/21/2017   IR RADIOLOGIST EVAL & MGMT 01/21/2017 [facility] RAD    Outpatient Medications Prior to Visit  Medication Sig Dispense Refill   apixaban  (ELIQUIS ) 5 MG TABS tablet Take 1 tablet (5 mg total) by mouth 2 (two) times daily. 60 tablet 5   atorvastatin  (LIPITOR) 40 MG tablet Take 1 tablet (40 mg total) by mouth daily. 90 tablet 1   calcium  carbonate (OSCAL) 1500 (600 Ca) MG TABS tablet Take by mouth 2 (two) times daily with a meal.     Cyanocobalamin (VITAMIN B-12 PO) Take 1 capsule by mouth daily.     losartan  (COZAAR ) 100 MG tablet Take 1 tablet (100 mg total) by mouth daily. 90 tablet 0   Magnesium 250 MG TABS Take 1 tablet by mouth daily.     Magnesium Oxide 250 MG TABS Take 1 tablet by mouth daily in the afternoon.     Multiple Vitamin (MULTIVITAMIN ADULT PO) Take 1 tablet by mouth  daily.     Potassium Gluconate 2 MEQ TABS Take 1 tablet by mouth daily in the afternoon.     sildenafil  (VIAGRA ) 50 MG tablet Take 2 tablets (100 mg total) by mouth daily as needed. 10 tablet 2   No facility-administered medications prior to visit.     Allergies:   Patient has no known allergies.   Family History:  The patient's family history includes Breast cancer (age of onset: 34) in his niece; Cancer in his paternal aunt and paternal great-grandmother; Cancer (age of onset: 7) in his cousin; Cancer (age of onset: 80) in his niece; Colon cancer in his maternal uncle; Colon cancer (age of onset: 57) in his nephew; Dementia in his mother; Diabetes in his father; Heart disease in his father; Kidney disease in his brother; Lung cancer in his paternal aunt; Other in his niece and sister.   ROS:   Please see the history of present illness.    ROS All other systems are reviewed and  are negative.   PHYSICAL EXAM:   VS:  BP 131/84 (BP Location: Left Arm, Patient Position: Sitting)   Pulse 70   Ht 5\' 9"  (1.753 m)   Wt 76.8 kg   SpO2 97%   BMI 25.02 kg/m      General: Alert, oriented x3, no distress, appears quite fit, younger than stated age Head: no evidence of trauma, PERRL, EOMI, no exophtalmos or lid lag, no myxedema, no xanthelasma; normal ears, nose and oropharynx Neck: normal jugular venous pulsations and no hepatojugular reflux; brisk carotid pulses without delay and no carotid bruits Chest: clear to auscultation, no signs of consolidation by percussion or palpation, normal fremitus, symmetrical and full respiratory excursions Cardiovascular: normal position and quality of the apical impulse, irregular rhythm, normal first and second heart sounds, no murmurs, rubs or gallops Abdomen: no tenderness or distention, no masses by palpation, no abnormal pulsatility or arterial bruits, normal bowel sounds, no hepatosplenomegaly Extremities: no clubbing, cyanosis or edema; 2+ radial,  ulnar and brachial pulses bilaterally; 2+ right femoral, posterior tibial and dorsalis pedis pulses; 2+ left femoral, posterior tibial and dorsalis pedis pulses; no subclavian or femoral bruits Neurological: grossly nonfocal Psych: Normal mood and affect     Wt Readings from Last 3 Encounters:  03/14/24 76.8 kg  03/07/24 76.4 kg  04/16/23 78.5 kg      Studies/Labs Reviewed:   Echocardiogram 06/11/2022:     1. 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 Left  ventricular ejection fraction, by   estimation, is 60 to 65%. The left ventricle has normal function. The  left ventricle has no regional wall motion abnormalities. There is mild  concentric left ventricular hypertrophy. Left ventricular diastolic  parameters are indeterminate.   2. Right ventricular systolic function is normal. The right ventricular  size is normal. There is normal pulmonary artery systolic pressure.   3. Left atrial size was severely dilated.   4. Right atrial size was severely dilated.   5. The mitral valve is normal in structure. No evidence of mitral valve  regurgitation. No evidence of mitral stenosis.   6. Tricuspid valve regurgitation is mild to moderate.   7. The aortic valve is normal in structure. Aortic valve regurgitation is  trivial. Aortic valve sclerosis is present, with no evidence of aortic  valve stenosis.   8. The inferior vena cava is normal in size with greater than 50%  respiratory variability, suggesting right atrial pressure of 3 mmHg.   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 Recommend Cardiac MR.      EKG:    EKG Interpretation Date/Time:  Tuesday Mar 14 2024 09:42:03 EDT Ventricular Rate:  67 PR Interval:    QRS Duration:  88 QT  Interval:  442 QTC Calculation: 467 R Axis:   77  Text Interpretation: Atrial fibrillation ST & Marked T wave abnormality, consider anterolateral ischemia Prolonged QT When compared with ECG of 09-Aug-2022 15:20, No significant change since last tracing Confirmed by Kimball Appleby ([id number]) on 03/14/2024 11:25:13 AM         Recent Labs: 03/07/2024: ALT 26; BUN 17; Creatinine, Ser 1.08; Potassium 4.8; Sodium 139   Lipid Panel    Component Value Date/Time   CHOL 132 03/07/2024 1018   TRIG 157 (H) 03/07/2024 1018   HDL 41 03/07/2024 1018   CHOLHDL 3.2 03/07/2024  1018   CHOLHDL 4.1 12/06/2013 1112   VLDL 14 12/06/2013 1112   LDLCALC 64 03/07/2024 1018    ASSESSMENT:    1. Chest pain of uncertain etiology   2. Precordial pain   3. Claustrophobia   4. Permanent atrial fibrillation (HCC)   5. Apical variant hypertrophic cardiomyopathy (HCC)   6. History of syncope   7. OSA (obstructive sleep apnea)   8. Essential hypertension   9. Hypercholesterolemia      PLAN:  In order of problems listed above:  Chest pressure: This occurs only during sexual intercourse and interesting does not occur if he has been "premedicated" with sildenafil .  He may be benefiting from either the antianginal or the pulmonary artery pressure reducing effect of this medication.  Echocardiogram is scheduled for tomorrow and hopefully will be able to estimate his pulmonary artery pressure on that.  Have also recommended a PET scan (he gets easily claustrophobic so we will give him a low-dose of benzodiazepine before the scan).  Unfortunately his atrial fibrillation will preclude good images on a coronary CT angiogram and I do not think there is sufficient evidence to justify invasive procedures such as heart catheterization without more data.   A. Fib: Permanent arrhythmia with severe bilateral atrial enlargement.  He does not require AV nodal blocking agents suggesting that he does have underlying conduction  system disease.  At night, he will occasionally have asymptomatic 3-second pauses, but these never occur during the daytime and these are never symptomatic.  He has not had any pauses in excess of 4 seconds.  It is likely at some point he will require pacemaker implantation, but this does not appear to be justified yet. Cardiomyopathy: Asymptomatic.  Preserved left ventricular systolic function.  His EKG and his echocardiogram suggests apical variant hypertrophic cardiomyopathy, but we have not been able to get a cardiac MRI due to his severe claustrophobia.  On an event monitor in 2023 he had a couple of very brief episodes of nonsustained VT, but no serious ventricular arrhythmia has been recorded by his loop recorder since implantation in January 2024.  Syncope: He had a single episode in 2017, but had a near syncopal event in 2023.  No recurrence since loop recorder reimplantation January 2024. OSA: Continues to be asymptomatic and denies daytime hypersomnolence.  He does admit to "scheduling afternoon naps".  He is very lean.  I wonder whether he would be a good candidate for an Inspire device.  We would probably have to get an updated sleep study.  His previous test was performed 01/29/2011 and reported severe obstructive and central sleep apnea, AHI 58.5/h, O2 nadir 82%.  Untreated obstructive sleep apnea puts him at risk for developing pulmonary artery hypertension, this could also explain the increased palpitations and chest pressure during sexual intercourse, that would be less likely to occur if he has taken sildenafil .  Hopefully the echocardiogram will shed some light on that.  If there is evidence of pulmonary hypertension we might schedule him for a right and left heart catheterization.  We talked about the risks and benefits of right left heart catheterization in some detail today and he is willing to proceed if this test is justified based on the findings on the PET scan and echo. HTN: Adequate  control.  Avoid medications with negative chronotropic effect. HLP: On atorvastatin  all his lipid parameters are in target range, albeit with a borderline low HDL cholesterol 41.  LDL is under 70.  Continue statin.  Medication Adjustments/Labs and Tests Ordered: Current medicines are reviewed at length with the patient today.  Concerns regarding medicines are outlined above.  Medication changes, Labs and Tests ordered today are listed in the Patient Instructions below. Patient Instructions  Medication Instructions:  Take Valium 2 mg one hour prior to your PET scan.  *If you need a refill on your cardiac medications before your next appointment, please call your pharmacy*   Testing/Procedures:    Please report to Radiology at the [facility] Main Entrance 30 minutes early for your test.  [address] [place], [place] [address]                         OR   Please report to Radiology at [facility] Main Entrance, medical mall, 30 mins prior to your test.  [address]  [place], [place]  How to Prepare for Your Cardiac PET/CT Stress Test:  Nothing to eat or drink, except water, 3 hours prior to arrival time.  NO caffeine/decaffeinated products, or chocolate 12 hours prior to arrival. (Please note decaffeinated beverages (teas/coffees) still contain caffeine).  If you have caffeine within 12 hours prior, the test will need to be rescheduled.  Medication instructions: Do not take erectile dysfunction medications for 72 hours prior to test (sildenafil , tadalafil) Do not take nitrates (isosorbide mononitrate, Ranexa) the day before or day of test Do not take tamsulosin  the day before or morning of test Hold theophylline containing medications for 12 hours. Hold Dipyridamole 48 hours prior to the test.  Diabetic Preparation: If able to eat breakfast prior to 3 hour fasting, you may take all medications, including your insulin. Do not worry if  you miss your breakfast dose of insulin - start at your next meal. If you do not eat prior to 3 hour fast-Hold all diabetes (oral and insulin) medications. Patients who wear a continuous glucose monitor MUST remove the device prior to scanning.  You may take your remaining medications with water.  NO perfume, cologne or lotion on chest or abdomen area. FEMALES - Please avoid wearing dresses to this appointment.  Total time is 1 to 2 hours; you may want to bring reading material for the waiting time.  IF YOU THINK YOU MAY BE PREGNANT, OR ARE NURSING PLEASE INFORM THE TECHNOLOGIST.  In preparation for your appointment, medication and supplies will be purchased.  Appointment availability is limited, so if you need to cancel or reschedule, please call the Radiology Department Scheduler at [phone] 24 hours in advance to avoid a cancellation fee of $100.00  What to Expect When you Arrive:  Once you arrive and check in for your appointment, you will be taken to a preparation room within the Radiology Department.  A technologist or Nurse will obtain your medical history, verify that you are correctly prepped for the exam, and explain the procedure.  Afterwards, an IV will be started in your arm and electrodes will be placed on your skin for EKG monitoring during the stress portion of the exam. Then you will be escorted to the PET/CT scanner.  There, staff will get you positioned on the scanner and obtain a blood pressure and EKG.  During the exam, you will continue to be connected to the EKG and blood pressure machines.  A small, safe amount of a radioactive tracer will be injected in your IV to obtain a series of pictures of your heart along with an  injection of a stress agent.    After your Exam:  It is recommended that you eat a meal and drink a caffeinated beverage to counter act any effects of the stress agent.  Drink plenty of fluids for the remainder of the day and urinate frequently for the  first couple of hours after the exam.  Your doctor will inform you of your test results within 7-10 business days.  For more information and frequently asked questions, please visit our website: [email or web address]  For questions about your test or how to prepare for your test, please call: Cardiac Imaging Nurse Navigators Office: [phone]   Follow-Up: At [facility], you and your health needs are our priority.  As part of our continuing mission to provide you with exceptional heart care, our providers are all part of one team.  This team includes your primary Cardiologist (physician) and Advanced Practice Providers or APPs (Physician Assistants and Nurse Practitioners) who all work together to provide you with the care you need, when you need it.  Your next appointment:    One year and F/U after PET- will notify  Provider:   Luana Rumple, MD    We recommend signing up for the patient portal called "MyChart".  Sign up information is provided on this After Visit Summary.  MyChart is used to connect with patients for Virtual Visits (Telemedicine).  Patients are able to view lab/test results, encounter notes, upcoming appointments, etc.  Non-urgent messages can be sent to your provider as well.   To learn more about what you can do with MyChart, go to [email or web address].            Signed, Luana Rumple, MD  03/14/2024 11:36 AM    [facility] [address] [place], [place], [place]  [address] Phone: [phone]; Fax: [phone]

## 2024-03-14 NOTE — Patient Instructions (Addendum)
 Medication Instructions:  Take Valium 2 mg one hour prior to your PET scan.  *If you need a refill on your cardiac medications before your next appointment, please call your pharmacy*   Testing/Procedures:    Please report to Radiology at the [facility] Main Entrance 30 minutes early for your test.  [address] [place], [place] [address]                         OR   Please report to Radiology at [facility] Main Entrance, medical mall, 30 mins prior to your test.  [address]  [place], [place]  How to Prepare for Your Cardiac PET/CT Stress Test:  Nothing to eat or drink, except water, 3 hours prior to arrival time.  NO caffeine/decaffeinated products, or chocolate 12 hours prior to arrival. (Please note decaffeinated beverages (teas/coffees) still contain caffeine).  If you have caffeine within 12 hours prior, the test will need to be rescheduled.  Medication instructions: Do not take erectile dysfunction medications for 72 hours prior to test (sildenafil , tadalafil) Do not take nitrates (isosorbide mononitrate, Ranexa) the day before or day of test Do not take tamsulosin  the day before or morning of test Hold theophylline containing medications for 12 hours. Hold Dipyridamole 48 hours prior to the test.  Diabetic Preparation: If able to eat breakfast prior to 3 hour fasting, you may take all medications, including your insulin. Do not worry if you miss your breakfast dose of insulin - start at your next meal. If you do not eat prior to 3 hour fast-Hold all diabetes (oral and insulin) medications. Patients who wear a continuous glucose monitor MUST remove the device prior to scanning.  You may take your remaining medications with water.  NO perfume, cologne or lotion on chest or abdomen area. FEMALES - Please avoid wearing dresses to this appointment.  Total time is 1 to 2 hours; you may want to bring reading material for the  waiting time.  IF YOU THINK YOU MAY BE PREGNANT, OR ARE NURSING PLEASE INFORM THE TECHNOLOGIST.  In preparation for your appointment, medication and supplies will be purchased.  Appointment availability is limited, so if you need to cancel or reschedule, please call the Radiology Department Scheduler at [phone] 24 hours in advance to avoid a cancellation fee of $100.00  What to Expect When you Arrive:  Once you arrive and check in for your appointment, you will be taken to a preparation room within the Radiology Department.  A technologist or Nurse will obtain your medical history, verify that you are correctly prepped for the exam, and explain the procedure.  Afterwards, an IV will be started in your arm and electrodes will be placed on your skin for EKG monitoring during the stress portion of the exam. Then you will be escorted to the PET/CT scanner.  There, staff will get you positioned on the scanner and obtain a blood pressure and EKG.  During the exam, you will continue to be connected to the EKG and blood pressure machines.  A small, safe amount of a radioactive tracer will be injected in your IV to obtain a series of pictures of your heart along with an injection of a stress agent.    After your Exam:  It is recommended that you eat a meal and drink a caffeinated beverage to counter act any effects of the stress agent.  Drink plenty of fluids for  the remainder of the day and urinate frequently for the first couple of hours after the exam.  Your doctor will inform you of your test results within 7-10 business days.  For more information and frequently asked questions, please visit our website: [email or web address]  For questions about your test or how to prepare for your test, please call: Cardiac Imaging Nurse Navigators Office: [phone]   Follow-Up: At [facility], you and your health needs are our priority.  As part of our continuing mission to provide  you with exceptional heart care, our providers are all part of one team.  This team includes your primary Cardiologist (physician) and Advanced Practice Providers or APPs (Physician Assistants and Nurse Practitioners) who all work together to provide you with the care you need, when you need it.  Your next appointment:    One year and F/U after PET- will notify  Provider:   Luana Rumple, MD    We recommend signing up for the patient portal called "MyChart".  Sign up information is provided on this After Visit Summary.  MyChart is used to connect with patients for Virtual Visits (Telemedicine).  Patients are able to view lab/test results, encounter notes, upcoming appointments, etc.  Non-urgent messages can be sent to your provider as well.   To learn more about what you can do with MyChart, go to [email or web address].

## 2024-03-15 ENCOUNTER — Ambulatory Visit
Admission: EM | Admit: 2024-03-15 | Discharge: 2024-03-15 | Disposition: A | Discharge status: Home / Self Care | Attending: Family Medicine | Admitting: Family Medicine

## 2024-03-15 ENCOUNTER — Ambulatory Visit (HOSPITAL_COMMUNITY)

## 2024-03-15 ENCOUNTER — Other Ambulatory Visit: Payer: Self-pay

## 2024-03-15 DIAGNOSIS — I361 Nonrheumatic tricuspid (valve) insufficiency: Secondary | ICD-10-CM | POA: Diagnosis not present

## 2024-03-15 DIAGNOSIS — M545 Low back pain, unspecified: Secondary | ICD-10-CM | POA: Diagnosis present

## 2024-03-15 DIAGNOSIS — R079 Chest pain, unspecified: Secondary | ICD-10-CM | POA: Diagnosis not present

## 2024-03-15 LAB — ECHOCARDIOGRAM COMPLETE
Area-P 1/2: 5.7 cm2
P 1/2 time: 727 ms
S' Lateral: 2.8 cm

## 2024-03-15 MED ORDER — CYCLOBENZAPRINE HCL 5 MG PO TABS
5.0000 mg | ORAL_TABLET | Freq: Two times a day (BID) | ORAL | 0 refills | Status: DC | PRN
Start: 2024-03-15 — End: 2024-06-17

## 2024-03-15 NOTE — Discharge Instructions (Addendum)
 You may take Flexeril  twice daily as needed for your back pain.  Please of this medication will make you drowsy.  Do not drink alcohol or drive on this medication.  Continue heat to the low back and rest.  Follow-up with your PCP if your symptoms do not improve.  Please go to the ER for any worsening symptoms.  Hope you feel better soon!

## 2024-03-15 NOTE — ED Provider Notes (Signed)
 [facility]    CSN: [id number] Arrival date & time: 03/15/24  1358      History   Chief Complaint No chief complaint on file.   HPI Wayne Molina is a 68 y.o. male presents for back pain.  Patient reports 2 weeks of an intermittent low back pain that occurs primarily when he goes from sitting to standing or if he bends over.  Denies any numbness/tingling/weakness of his lower extremities, no bowel or bladder incontinence, no saddle paresthesia.  No injury or known inciting event.  He has been doing Tylenol , ibuprofen , heat and ice with minimal improvement.  The city has a history of osteopenia and arthritis.  No other concerns at this time.  HPI  Past Medical History:  Diagnosis Date   Anxiety    Arthritis    fingers   Chronic atrial fibrillation (HCC)    Depression    Family history of breast cancer    Family history of cancer of male genital organ    Family history of gene mutation    BRIP1   Family history of malignant neoplasm of gastrointestinal tract    Hyperlipidemia    Hypertension    Personal history of colonic polyps 11/12/2006   Sleep apnea     Patient Active Problem List   Diagnosis Date Noted   Right foot pain 12/16/2022   Chest pain 08/17/2022   Postural dizziness with presyncope 06/10/2022   Genetic testing 03/19/2021   Family history of gene mutation    Family history of breast cancer    Family history of malignant neoplasm of gastrointestinal tract    Family history of cancer of male genital organ    Peyronie disease 06/12/2020   Elevated bilirubin 05/26/2019   Lower urinary tract symptoms (LUTS) 05/24/2019   Osteoporosis 01/27/2017   Erectile dysfunction 01/29/2016   Syncope 01/01/2016   Lumbar vertebral fracture (HCC) 07/22/2015   Hyperlipidemia 07/22/2015   Myocardiopathy (HCC) 07/11/2013   Essential hypertension 05/23/2012   Sleep apnea: unable to tolerate CPAP 03/04/2011   Atrial fibrillation (HCC) 01/06/2007    Past  Surgical History:  Procedure Laterality Date   2-D echocardiogram  07/22/2010   Ejection fraction greater than 55%. Left atrium moderately dilated. Right atrium moderately dilated. 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 Mild to Moderate MR. Mild to moderate TR.   ATRIAL ABLATION SURGERY  2004   EP IMPLANTABLE DEVICE N/A 02/11/2016   Procedure: Loop Recorder Insertion;  Surgeon: Luana Rumple, MD;  Location: [facility];  Service: Cardiovascular;  Laterality: N/A;   Exercise Myoview stress test  07/08/2000   Nonischemic low-risk.   HIP FRACTURE SURGERY  1970s   IR GENERIC HISTORICAL  01/21/2017   IR RADIOLOGIST EVAL & MGMT 01/21/2017 [facility]       Home Medications    Prior to Admission medications   Medication Sig Start Date End Date Taking? Authorizing Provider  cyclobenzaprine  (FLEXERIL ) 5 MG tablet Take 1 tablet (5 mg total) by mouth 2 (two) times daily as needed for muscle spasms. 03/15/24  Yes Devario Bucklew, Jodi R, NP  apixaban  (ELIQUIS ) 5 MG TABS tablet Take 1 tablet (5 mg total) by mouth 2 (two) times daily. 09/22/23   Croitoru, Mihai, MD  atorvastatin  (LIPITOR) 40 MG tablet Take 1 tablet (40 mg total) by mouth daily. 12/16/23   Veronia Goon, DO  calcium  carbonate (OSCAL) 1500 (600 Ca) MG TABS tablet Take by mouth 2 (two) times daily with a meal.    [provider]  Cyanocobalamin (VITAMIN B-12 PO) Take 1 capsule by mouth daily.    [provider]  diazepam (VALIUM) 2 MG tablet Take 1 tablet by mouth as directed 1 hour prior to pet scan. 03/14/24   Croitoru, Mihai, MD  losartan  (COZAAR ) 100 MG tablet Take 1 tablet (100 mg total) by mouth daily. 02/07/24   Dahbura, Anton, DO  Magnesium 250 MG TABS Take 1 tablet by mouth daily.    [provider]  Magnesium Oxide 250 MG TABS Take 1 tablet by mouth daily in the afternoon. 02/07/18   [provider]  Multiple Vitamin (MULTIVITAMIN ADULT PO) Take 1 tablet by mouth daily. 02/07/18   [provider]   Potassium Gluconate 2 MEQ TABS Take 1 tablet by mouth daily in the afternoon. 02/07/18   [provider]  sildenafil  (VIAGRA ) 50 MG tablet Take 2 tablets (100 mg total) by mouth daily as needed. 06/14/23   Marveen Slick, MD    Family History Family History  Problem Relation Age of Onset   Dementia Mother    Heart disease Father    Diabetes Father    Other Sister        BRIP1 gene mutation   Kidney disease Brother    Colon cancer Maternal Uncle    Lung cancer Paternal Aunt        d. >50   Cancer Paternal Aunt        unknown type, d. >50   Cancer Cousin 17       gynecologic (paternal first cousin)   Colon cancer Nephew 27       arising in colon polyp   Cancer Niece 19       gynecologic; niece in her 57's   Breast cancer Niece 61   Other Niece        BRIP1 gene mutation   Cancer Paternal Great-grandmother        abdominal cancer (PGF's mother) great grandmother    Social History Social History   Tobacco Use   Smoking status: Former    Current packs/day: 1.00    Types: Cigarettes   Smokeless tobacco: Never   Tobacco comments:    Smoked for approximately 6 months in his life  Vaping Use   Vaping status: Never Used  Substance Use Topics   Alcohol use: No   Drug use: No     Allergies   Patient has no known allergies.   Review of Systems Review of Systems  Musculoskeletal:  Positive for back pain.     Physical Exam Triage Vital Signs ED Triage Vitals  Encounter Vitals Group     BP 03/15/24 1434 116/75     Systolic BP Percentile --      Diastolic BP Percentile --      Pulse Rate 03/15/24 1434 83     Resp 03/15/24 1434 16     Temp 03/15/24 1434 (!) 97.5 F (36.4 C)     Temp Source 03/15/24 1434 Oral     SpO2 03/15/24 1434 94 %     Weight --      Height --      Head Circumference --      Peak Flow --      Pain Score 03/15/24 1433 7     Pain Loc --      Pain Education --      Exclude from Growth Chart --    No data found.  Updated  Vital Signs  BP 116/75   Pulse 83   Temp (!) 97.5 F (36.4 C) (Oral)   Resp 16   SpO2 94%   Visual Acuity Right Eye Distance:   Left Eye Distance:   Bilateral Distance:    Right Eye Near:   Left Eye Near:    Bilateral Near:     Physical Exam Vitals and nursing note reviewed.  Constitutional:      General: He is not in acute distress.    Appearance: Normal appearance. He is not ill-appearing.  HENT:     Head: Normocephalic and atraumatic.  Eyes:     Pupils: Pupils are equal, round, and reactive to light.  Cardiovascular:     Rate and Rhythm: Normal rate.  Pulmonary:     Effort: Pulmonary effort is normal.  Musculoskeletal:     Lumbar back: Tenderness present. No swelling, edema, deformity, signs of trauma, lacerations, spasms or bony tenderness. Normal range of motion. Negative right straight leg raise test and negative left straight leg raise test. No scoliosis.       Back:     Comments: Strength is 5 out of 5 bilateral lower extremities  Skin:    General: Skin is warm and dry.  Neurological:     General: No focal deficit present.     Mental Status: He is alert and oriented to person, place, and time.  Psychiatric:        Mood and Affect: Mood normal.        Behavior: Behavior normal.      UC Treatments / Results  Labs (all labs ordered are listed, but only abnormal results are displayed) Labs Reviewed - No data to display  tains abnormal data Comprehensive metabolic panel with GFR Order: [id number]  Status: Final result     Next appt: 03/20/2024 at 07:40 AM in Cardiology ([facility] Device Rem[facility])     Dx: Essential hypertension   Test Result Released: Yes (seen)     Messages: Seen   0 Result Notes     1 Patient Communication     View Follow-Up Encounter          Component Ref Range & Units (hover) 8 d ago (03/07/24) 1 yr ago (08/09/22) 1 yr ago (06/11/22) 1 yr ago (06/11/22) 1 yr ago (06/10/22) 1 yr ago (05/26/22) 3 yr ago (03/29/20)  Glucose 93 112  High  CM 80 CM  101 High  CM 90 92 R  BUN 17 22 R 19 R  22 R 24 28 High   Creatinine, Ser 1.08 1.12 R 0.93 R 0.87 R 1.02 R 1.09 1.22  eGFR 74     74   BUN/Creatinine Ratio 16     22 23   Sodium 139 137 R 141 R  136 R 139 138  Potassium 4.8 4.2 R 3.9 R  4.0 R 5.1 4.6  Chloride 101 108 R 106 R  104 R 102 103  CO2 25 26 R 27 R  26 R 20 21  Calcium  10.5 High  8.8 Low  R 9.3 R  9.9 R 9.8 9.6  Total Protein 7.1  6.5 R   7.7 6.8  Albumin 4.7  3.7 R   4.8 CM 4.2 R  Globulin, Total 2.4     2.9 2.6  Bilirubin Total 1.5 High   2.7 High  R   1.7 High  1.4 High   Alkaline Phosphatase 60  45 R   56 58 R, CM  AST 29  27 R   35 29  ALT 26  24 R   25 23  Resulting Agency [facility]         Narrative Performed by: Trenia Fritter Performed at:  [address] [facility] [address], [place], [place]  [id number] Lab Director: Pearlean Botts MD, Phone:  [phone]  Specimen Collected: 03/07/24 10:18 Last Resulted: 03/08/24 08:09    EKG   Radiology No results found.  Procedures Procedures (including critical care time)  Medications Ordered in UC Medications - No data to display  Initial Impression / Assessment and Plan / UC Course  I have reviewed the triage vital signs and the nursing notes.  Pertinent labs & imaging results that were available during my care of the patient were reviewed by me and considered in my medical decision making (see chart for details).     Reviewed exam and symptoms with patient.  No red flags.  Discussed muscle pain/strain.  No Toradol  as patient is on Eliquis .  States has been on muscle relaxers the past with good improvement and tolerated well, will do Flexeril  twice daily as needed, side effect profile reviewed.  Advised to avoid ibuprofen  given he is on Eliquis .  He may continue Tylenol  and heat as needed.  Advised PCP follow-up if symptoms do not improve.  ER precautions reviewed and patient verbalized  understanding. Final Clinical Impressions(s) / UC Diagnoses   Final diagnoses:  Acute bilateral low back pain without sciatica     Discharge Instructions      You may take Flexeril  twice daily as needed for your back pain.  Please of this medication will make you drowsy.  Do not drink alcohol or drive on this medication.  Continue heat to the low back and rest.  Follow-up with your PCP if your symptoms do not improve.  Please go to the ER for any worsening symptoms.  Hope you feel better soon!     ED Prescriptions     Medication Sig Dispense Auth. Provider   cyclobenzaprine  (FLEXERIL ) 5 MG tablet Take 1 tablet (5 mg total) by mouth 2 (two) times daily as needed for muscle spasms. 15 tablet Labrian Torregrossa, Jodi R, NP      PDMP not reviewed this encounter.   Alleen Arbour, NP 03/15/24 1451

## 2024-03-15 NOTE — ED Triage Notes (Signed)
 Pt c/o mid lower back [id number]. Pt denies injury. Pt state she has been using ice and heat and taking motrin , but its not helping. Pt states it mainly hurts when he goes from sitting to standing. Pt c/o tingling in the area. Pt denies loss of bowel or bladder.

## 2024-03-16 ENCOUNTER — Encounter: Payer: Self-pay | Admitting: Student

## 2024-03-20 ENCOUNTER — Ambulatory Visit (INDEPENDENT_AMBULATORY_CARE_PROVIDER_SITE_OTHER): Payer: No Typology Code available for payment source

## 2024-03-20 DIAGNOSIS — R55 Syncope and collapse: Secondary | ICD-10-CM

## 2024-03-21 ENCOUNTER — Ambulatory Visit: Payer: Self-pay | Admitting: Cardiovascular Disease

## 2024-03-21 LAB — CUP PACEART REMOTE DEVICE CHECK
Date Time Interrogation Session: 20250511234012
Implantable Pulse Generator Implant Date: 20240111

## 2024-03-22 DIAGNOSIS — Z008 Encounter for other general examination: Secondary | ICD-10-CM | POA: Diagnosis not present

## 2024-03-22 DIAGNOSIS — I10 Essential (primary) hypertension: Secondary | ICD-10-CM | POA: Diagnosis not present

## 2024-03-22 DIAGNOSIS — I4891 Unspecified atrial fibrillation: Secondary | ICD-10-CM | POA: Diagnosis not present

## 2024-03-22 DIAGNOSIS — M8580 Other specified disorders of bone density and structure, unspecified site: Secondary | ICD-10-CM | POA: Diagnosis not present

## 2024-03-22 DIAGNOSIS — F17211 Nicotine dependence, cigarettes, in remission: Secondary | ICD-10-CM | POA: Diagnosis not present

## 2024-03-22 DIAGNOSIS — E785 Hyperlipidemia, unspecified: Secondary | ICD-10-CM | POA: Diagnosis not present

## 2024-03-23 ENCOUNTER — Ambulatory Visit: Admitting: Student

## 2024-03-23 ENCOUNTER — Encounter: Payer: Self-pay | Admitting: Student

## 2024-03-23 VITALS — BP 113/73 | HR 78 | Ht 69.0 in | Wt 167.0 lb

## 2024-03-23 DIAGNOSIS — M8080XA Other osteoporosis with current pathological fracture, unspecified site, initial encounter for fracture: Secondary | ICD-10-CM

## 2024-03-23 NOTE — Assessment & Plan Note (Signed)
 Reviewed most recent and previous bone density scans, due for next January 2026.  Has appropriately corrected calcium  level, continue calcium  and vitamin D .  Given he has completed bisphosphonate therapy, should his bone density reflect osteoporosis in the future, recommend Prolia.

## 2024-03-23 NOTE — Progress Notes (Signed)
  SUBJECTIVE:   CHIEF COMPLAINT / HPI:   Osteopenia: Notably had hypercalcemia of 10.5 during last lab check, potentially related to Os-Cal D intake for osteopenia corrected to 9.9 with albumin level.  His last bone density scan was December 2023. He used to be on Fosamax , began in 2018, but took it for 5 years and stopped in 2023.   PERTINENT  PMH / PSH: HTN, A-fib on Eliquis , HLD, osteopenia  OBJECTIVE:  BP 113/73   Pulse 78   Ht 5\' 9"  (1.753 m)   Wt 167 lb (75.8 kg)   SpO2 100%   BMI 24.66 kg/m  General: Well-appearing, NAD  ASSESSMENT/PLAN:   Assessment & Plan Other osteoporosis with current pathological fracture, initial encounter Reviewed most recent and previous bone density scans, due for next January 2026.  Has appropriately corrected calcium  level, continue calcium  and vitamin D .  Given he has completed bisphosphonate therapy, should his bone density reflect osteoporosis in the future, recommend Prolia. Return if symptoms worsen or fail to improve. Veronia Goon, DO 03/23/2024, 4:44 PM PGY-3, [facility]

## 2024-03-23 NOTE — Patient Instructions (Addendum)
 It was great to see you today! Thank you for choosing [facility] for your primary care.  Today we addressed: Continue calcium  and vitamin D .  You will be due for next DEXA (bone scan) January 2026.  You should continue to receive this every 2 years.  If you haven't already, sign up for My Chart to have easy access to your labs results, and communication with your primary care physician.  Return if symptoms worsen or fail to improve. Please arrive 15 minutes before your appointment to ensure smooth check in process.  We appreciate your efforts in making this happen.  Thank you for allowing me to participate in your care, Veronia Goon, DO 03/23/2024, 4:36 PM PGY-3, [facility]

## 2024-03-26 ENCOUNTER — Other Ambulatory Visit: Payer: Self-pay | Admitting: Cardiovascular Disease

## 2024-03-27 ENCOUNTER — Other Ambulatory Visit (HOSPITAL_COMMUNITY): Payer: Self-pay

## 2024-03-27 MED ORDER — APIXABAN 5 MG PO TABS
5.0000 mg | ORAL_TABLET | Freq: Two times a day (BID) | ORAL | 1 refills | Status: DC
  Filled 2024-03-27 – 2024-04-05 (×2): qty 180, 90d supply, fill #0
  Filled 2024-07-02: qty 180, 90d supply, fill #1

## 2024-03-28 ENCOUNTER — Other Ambulatory Visit: Payer: Self-pay

## 2024-03-28 ENCOUNTER — Other Ambulatory Visit (HOSPITAL_COMMUNITY): Payer: Self-pay

## 2024-03-31 ENCOUNTER — Other Ambulatory Visit: Payer: Self-pay

## 2024-04-05 ENCOUNTER — Other Ambulatory Visit (HOSPITAL_BASED_OUTPATIENT_CLINIC_OR_DEPARTMENT_OTHER): Payer: Self-pay

## 2024-04-05 ENCOUNTER — Other Ambulatory Visit: Payer: Self-pay

## 2024-04-05 ENCOUNTER — Other Ambulatory Visit (HOSPITAL_COMMUNITY): Payer: Self-pay

## 2024-04-05 NOTE — Progress Notes (Signed)
 [facility] Summary Report / Loop Recorder

## 2024-04-05 NOTE — Addendum Note (Signed)
 Addended by: Edra Govern D on: 04/05/2024 10:18 AM   Modules accepted: Orders

## 2024-04-06 ENCOUNTER — Encounter: Payer: Self-pay | Admitting: Pharmacist

## 2024-04-06 ENCOUNTER — Other Ambulatory Visit: Payer: Self-pay

## 2024-04-11 ENCOUNTER — Other Ambulatory Visit: Payer: Self-pay

## 2024-04-18 ENCOUNTER — Encounter: Payer: Self-pay | Admitting: *Deleted

## 2024-04-20 ENCOUNTER — Ambulatory Visit (INDEPENDENT_AMBULATORY_CARE_PROVIDER_SITE_OTHER)

## 2024-04-20 DIAGNOSIS — R55 Syncope and collapse: Secondary | ICD-10-CM

## 2024-04-20 LAB — CUP PACEART REMOTE DEVICE CHECK
Date Time Interrogation Session: 20250611232929
Implantable Pulse Generator Implant Date: 20240111

## 2024-05-01 ENCOUNTER — Ambulatory Visit: Payer: Self-pay | Admitting: Cardiovascular Disease

## 2024-05-09 NOTE — Addendum Note (Signed)
 Addended by: TAWNI DRILLING D on: 05/09/2024 10:50 AM   Modules accepted: Orders

## 2024-05-09 NOTE — Progress Notes (Signed)
 [facility] Summary Report / Loop Recorder

## 2024-05-10 ENCOUNTER — Other Ambulatory Visit (HOSPITAL_COMMUNITY): Payer: Self-pay

## 2024-05-10 ENCOUNTER — Other Ambulatory Visit: Payer: Self-pay | Admitting: Student

## 2024-05-10 MED ORDER — LOSARTAN POTASSIUM 100 MG PO TABS
100.0000 mg | ORAL_TABLET | Freq: Every day | ORAL | 3 refills | Status: DC
  Filled 2024-05-10: qty 90, 90d supply, fill #0

## 2024-05-22 ENCOUNTER — Ambulatory Visit (INDEPENDENT_AMBULATORY_CARE_PROVIDER_SITE_OTHER)

## 2024-05-22 DIAGNOSIS — R55 Syncope and collapse: Secondary | ICD-10-CM | POA: Diagnosis not present

## 2024-05-23 ENCOUNTER — Ambulatory Visit: Payer: Self-pay | Admitting: Cardiovascular Disease

## 2024-05-23 LAB — CUP PACEART REMOTE DEVICE CHECK
Date Time Interrogation Session: 20250713234238
Implantable Pulse Generator Implant Date: 20240111

## 2024-06-11 DIAGNOSIS — I422 Other hypertrophic cardiomyopathy: Secondary | ICD-10-CM | POA: Insufficient documentation

## 2024-06-14 ENCOUNTER — Inpatient Hospital Stay (HOSPITAL_COMMUNITY): Admission: RE | Admit: 2024-06-14 | Source: Ambulatory Visit

## 2024-06-14 NOTE — Progress Notes (Signed)
 [facility] Summary Report / Loop Recorder

## 2024-06-17 ENCOUNTER — Ambulatory Visit
Admission: EM | Admit: 2024-06-17 | Discharge: 2024-06-17 | Disposition: A | Discharge status: Home / Self Care | Attending: Family Medicine | Admitting: Family Medicine

## 2024-06-17 DIAGNOSIS — S161XXA Strain of muscle, fascia and tendon at neck level, initial encounter: Secondary | ICD-10-CM | POA: Diagnosis not present

## 2024-06-17 MED ORDER — LIDOCAINE 5 % EX PTCH
1.0000 | MEDICATED_PATCH | CUTANEOUS | 0 refills | Status: DC
Start: 2024-06-17 — End: 2024-08-04

## 2024-06-17 MED ORDER — CYCLOBENZAPRINE HCL 5 MG PO TABS: 5.0000 mg | ORAL_TABLET | Freq: Two times a day (BID) | ORAL | 0 refills | Status: DC | PRN

## 2024-06-17 NOTE — ED Provider Notes (Addendum)
 [facility]    CSN: [id number] Arrival date & time: 06/17/24  0809      History   Chief Complaint Chief Complaint  Patient presents with   Neck Pain    HPI Wayne Molina is a 68 y.o. male with a past medical history of arthritis, atrial fibrillation, hyperlipidemia, hypertension, OSA presents for neck pain.  Patient reports 4 days of a left-sided neck pain that radiates into his left posterior shoulder/shoulder blade.  He denies any known injury and thinks he slept wrong.  Reports has had similar symptoms in the past when he has slept wrong.  He denies any numbness/ting/weakness of his upper extremities.  No fevers, chills, nausea vomiting or nuchal rigidity.  No history of injuries or surgeries to the neck.  He has been doing Bengay, heat and ice, ibuprofen  and Tylenol  with minimal improvement.  No other concerns at this time.   Neck Pain   Past Medical History:  Diagnosis Date   Anxiety    Arthritis    fingers   Chronic atrial fibrillation (HCC)    Depression    Family history of breast cancer    Family history of cancer of male genital organ    Family history of gene mutation    BRIP1   Family history of malignant neoplasm of gastrointestinal tract    Hyperlipidemia    Hypertension    Personal history of colonic polyps 11/12/2006   Sleep apnea     Patient Active Problem List   Diagnosis Date Noted   Right foot pain 12/16/2022   Chest pain 08/17/2022   Postural dizziness with presyncope 06/10/2022   Genetic testing 03/19/2021   Family history of gene mutation    Family history of breast cancer    Family history of malignant neoplasm of gastrointestinal tract    Family history of cancer of male genital organ    Peyronie disease 06/12/2020   Elevated bilirubin 05/26/2019   Lower urinary tract symptoms (LUTS) 05/24/2019   Osteoporosis 01/27/2017   Erectile dysfunction 01/29/2016   Syncope 01/01/2016   Lumbar vertebral fracture (HCC) 07/22/2015    Hyperlipidemia 07/22/2015   Myocardiopathy (HCC) 07/11/2013   Essential hypertension 05/23/2012   Sleep apnea: unable to tolerate CPAP 03/04/2011   Atrial fibrillation (HCC) 01/06/2007    Past Surgical History:  Procedure Laterality Date   2-D echocardiogram  07/22/2010   Ejection fraction greater than 55%. Left atrium moderately dilated. Right atrium moderately dilated. 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 Mild to Moderate MR. Mild to moderate TR.   ATRIAL ABLATION SURGERY  2004   EP IMPLANTABLE DEVICE N/A 02/11/2016   Procedure: Loop Recorder Insertion;  Surgeon: Jerel Balding, MD;  [facility]: [facility];  Service: Cardiovascular;  Laterality: N/A;   Exercise Myoview stress test  07/08/2000   Nonischemic low-risk.   HIP FRACTURE SURGERY  1970s   IR GENERIC HISTORICAL  01/21/2017   IR RADIOLOGIST EVAL & MGMT 01/21/2017 [facility]       Home Medications    Prior to Admission medications   Medication Sig Start Date End Date Taking? Authorizing Provider  cyclobenzaprine  (FLEXERIL ) 5 MG tablet Take 1 tablet (5 mg total) by mouth 2 (two) times daily as needed for muscle spasms. 06/17/24  Yes Dessire Grimes, Jodi R, NP  lidocaine  (LIDODERM ) 5 % Place 1 patch onto the skin daily. Apply to affected area on neck/shoulder and keep in place for 12 hours and remove for 12 hours 06/17/24  Yes Maezie Justin, Jodi  R, NP  apixaban  (ELIQUIS ) 5 MG TABS tablet Take 1 tablet (5 mg total) by mouth 2 (two) times daily. 03/27/24   Croitoru, Mihai, MD  atorvastatin  (LIPITOR) 40 MG tablet Take 1 tablet (40 mg total) by mouth daily. 12/16/23   Orlando Pond, DO  calcium  carbonate (OSCAL) 1500 (600 Ca) MG TABS tablet Take by mouth 2 (two) times daily with a meal.    [provider]  Cyanocobalamin (VITAMIN B-12 PO) Take 1 capsule by mouth daily.    [provider]  losartan  (COZAAR ) 100 MG tablet Take 1 tablet (100 mg total) by mouth daily. 05/10/24   Gomes, Adriana, DO  Magnesium Oxide 250 MG TABS Take 1 tablet  by mouth daily in the afternoon. 02/07/18   [provider]  Multiple Vitamin (MULTIVITAMIN ADULT PO) Take 1 tablet by mouth daily. 02/07/18   [provider]  Potassium Gluconate 2 MEQ TABS Take 1 tablet by mouth daily in the afternoon. 02/07/18   [provider]  sildenafil  (VIAGRA ) 50 MG tablet Take 2 tablets (100 mg total) by mouth daily as needed. 06/14/23   Jeanelle Layman CROME, MD    Family History Family History  Problem Relation Age of Onset   Dementia Mother    Heart disease Father    Diabetes Father    Other Sister        BRIP1 gene mutation   Kidney disease Brother    Colon cancer Maternal Uncle    Lung cancer Paternal Aunt        d. >50   Cancer Paternal Aunt        unknown type, d. >50   Cancer Cousin 17       gynecologic (paternal first cousin)   Colon cancer Nephew 27       arising in colon polyp   Cancer Niece 17       gynecologic; niece in her 44's   Breast cancer Niece 67   Other Niece        BRIP1 gene mutation   Cancer Paternal Great-grandmother        abdominal cancer (PGF's mother) great grandmother    Social History Social History   Tobacco Use   Smoking status: Former    Current packs/day: 1.00    Types: Cigarettes   Smokeless tobacco: Never   Tobacco comments:    Smoked for approximately 6 months in his life  Vaping Use   Vaping status: Never Used  Substance Use Topics   Alcohol use: No   Drug use: No     Allergies   Patient has no known allergies.   Review of Systems Review of Systems  Musculoskeletal:  Positive for neck pain.     Physical Exam Triage Vital Signs ED Triage Vitals  Encounter Vitals Group     BP 06/17/24 0822 (!) 144/81     Girls Systolic BP Percentile --      Girls Diastolic BP Percentile --      Boys Systolic BP Percentile --      Boys Diastolic BP Percentile --      Pulse Rate 06/17/24 0822 66     Resp --      Temp 06/17/24 0822 97.8 F (36.6 C)     Temp Source 06/17/24 0822 Oral      SpO2 06/17/24 0822 97 %     Weight --      Height --      Head Circumference --  Peak Flow --      Pain Score 06/17/24 0821 5     Pain Loc --      Pain Education --      Exclude from Growth Chart --    No data found.  Updated Vital Signs BP (!) 144/81 (BP Location: Right Arm)   Pulse 66   Temp 97.8 F (36.6 C) (Oral)   SpO2 97%   Visual Acuity Right Eye Distance:   Left Eye Distance:   Bilateral Distance:    Right Eye Near:   Left Eye Near:    Bilateral Near:     Physical Exam Vitals and nursing note reviewed.  Constitutional:      General: He is not in acute distress.    Appearance: Normal appearance. He is not ill-appearing.  HENT:     Head: Normocephalic and atraumatic.  Eyes:     Pupils: Pupils are equal, round, and reactive to light.  Neck:      Comments: Tender to palpation to left-sided paracervical muscles that radiates into the left trapezius and slightly to left rhomboid.  Full range of motion of neck with pain on left side and rotation and flexion.  Strength is 5 out of 5 bilateral upper extremities. Cardiovascular:     Rate and Rhythm: Normal rate.  Pulmonary:     Effort: Pulmonary effort is normal.  Musculoskeletal:     Cervical back: Normal range of motion and neck supple. No edema, erythema, signs of trauma, rigidity, torticollis or crepitus. Pain with movement and muscular tenderness present. No spinous process tenderness. Normal range of motion.     Thoracic back: No tenderness or bony tenderness.     Lumbar back: No tenderness or bony tenderness.  Skin:    General: Skin is warm and dry.  Neurological:     General: No focal deficit present.     Mental Status: He is alert and oriented to person, place, and time.  Psychiatric:        Mood and Affect: Mood normal.        Behavior: Behavior normal.      UC Treatments / Results  Labs (all labs ordered are listed, but only abnormal results are displayed) Labs Reviewed - No data to  display  Comprehensive metabolic panel with GFR Order: [id number]  Status: Final result     Next appt: 06/22/2024 at 07:35 AM in Cardiology ([facility] Device Rem[facility])     Dx: Essential hypertension   Test Result Released: Yes (seen)     Messages: Seen   0 Result Notes     1 Patient Communication     View Follow-Up Encounter          Component Ref Range & Units (hover) 3 mo ago (03/07/24) 1 yr ago (08/09/22) 2 yr ago (06/11/22) 2 yr ago (06/11/22) 2 yr ago (06/10/22) 2 yr ago (05/26/22) 4 yr ago (03/29/20)  Glucose 93 112 High  CM 80 CM  101 High  CM 90 92 R  BUN 17 22 R 19 R  22 R 24 28 High   Creatinine, Ser 1.08 1.12 R 0.93 R 0.87 R 1.02 R 1.09 1.22  eGFR 74     74   BUN/Creatinine Ratio 16     22 23   Sodium 139 137 R 141 R  136 R 139 138  Potassium 4.8 4.2 R 3.9 R  4.0 R 5.1 4.6  Chloride 101 108 R 106 R  104 R 102 103  CO2 25 26 R 27 R  26 R 20 21  Calcium  10.5 High  8.8 Low  R 9.3 R  9.9 R 9.8 9.6  Total Protein 7.1  6.5 R   7.7 6.8  Albumin 4.7  3.7 R   4.8 CM 4.2 R  Globulin, Total 2.4     2.9 2.6  Bilirubin Total 1.5 High   2.7 High  R   1.7 High  1.4 High   Alkaline Phosphatase 60  45 R   56 58 R, CM  AST 29  27 R   35 29  ALT 26  24 R   25 23  Resulting Agency [facility]         Narrative Performed by: [facility] Performed at:  [address], [place], [place]  [id number] Lab Director: Frankey Sas MD, Phone:  [phone]  Specimen Collected: 03/07/24 10:18 Last Resulted: 03/08/24 08:09       EKG   Radiology No results found.  Procedures Procedures (including critical care time)  Medications Ordered in UC Medications - No data to display  Initial Impression / Assessment and Plan / UC Course  I have reviewed the triage vital signs and the nursing notes.  Pertinent labs & imaging results that were available during my care of the patient were reviewed by me and  considered in my medical decision making (see chart for details).     Reviewed exam and symptoms with patient.  No red flags.  Discussed muscle strain/spasm.  Will do the Lidoderm  patch as needed.  Low-dose Flexeril  twice daily as needed, side effect profile reviewed.  Patient states he has been on this in the past and tolerated well.  No NSAIDs as patient is on Eliquis .  advised heat rest and PCP follow-up if symptoms do not improve.  ER precautions reviewed. Final Clinical Impressions(s) / UC Diagnoses   Final diagnoses:  Strain of neck muscle, initial encounter     Discharge Instructions      You may take Flexeril  twice daily as needed for your neck pain.  Please note this medication will make you drowsy.  Do not drink alcohol or drive on this medication.  You may place a Lidoderm  patch over the area of your neck and keep it in place for 12 hours and remove for 12 hours.  You may continue heat/ice and over-the-counter Tylenol  as needed.  Avoid over-the-counter NSAIDs such as ibuprofen , Advil , Aleve, naproxen as you are on Eliquis .  Please follow-up with your PCP if your symptoms do not improve.  Please go to the ER if you develop any worsening symptoms.  Hope you feel better soon!    ED Prescriptions     Medication Sig Dispense Auth. Provider   lidocaine  (LIDODERM ) 5 % Place 1 patch onto the skin daily. Apply to affected area on neck/shoulder and keep in place for 12 hours and remove for 12 hours 10 patch Jhaden Pizzuto, Jodi R, NP   cyclobenzaprine  (FLEXERIL ) 5 MG tablet Take 1 tablet (5 mg total) by mouth 2 (two) times daily as needed for muscle spasms. 10 tablet Michale Emmerich, Jodi R, NP      PDMP not reviewed this encounter.   Loreda Myla SAUNDERS, NP 06/17/24 0848    Loreda Myla SAUNDERS, NP 06/17/24 [phone]

## 2024-06-17 NOTE — ED Triage Notes (Signed)
 Pt present with c/o stiffness in neck x Wednesday. States he tried ice/heat compress, BENGAY and has not had any relief. Pt reports pain when moving side to side, forward and back ward.

## 2024-06-17 NOTE — Discharge Instructions (Addendum)
 You may take Flexeril  twice daily as needed for your neck pain.  Please note this medication will make you drowsy.  Do not drink alcohol or drive on this medication.  You may place a Lidoderm  patch over the area of your neck and keep it in place for 12 hours and remove for 12 hours.  You may continue heat/ice and over-the-counter Tylenol  as needed.  Avoid over-the-counter NSAIDs such as ibuprofen , Advil , Aleve, naproxen as you are on Eliquis .  Please follow-up with your PCP if your symptoms do not improve.  Please go to the ER if you develop any worsening symptoms.  Hope you feel better soon!

## 2024-06-22 ENCOUNTER — Ambulatory Visit (INDEPENDENT_AMBULATORY_CARE_PROVIDER_SITE_OTHER)

## 2024-06-22 DIAGNOSIS — R55 Syncope and collapse: Secondary | ICD-10-CM

## 2024-06-22 LAB — CUP PACEART REMOTE DEVICE CHECK
Date Time Interrogation Session: 20250813233412
Implantable Pulse Generator Implant Date: 20240111

## 2024-06-23 ENCOUNTER — Ambulatory Visit: Payer: Self-pay | Admitting: Cardiovascular Disease

## 2024-06-29 ENCOUNTER — Other Ambulatory Visit (HOSPITAL_COMMUNITY): Payer: Self-pay

## 2024-06-29 ENCOUNTER — Other Ambulatory Visit: Payer: Self-pay | Admitting: Family Medicine

## 2024-06-29 ENCOUNTER — Other Ambulatory Visit: Payer: Self-pay

## 2024-06-29 DIAGNOSIS — E785 Hyperlipidemia, unspecified: Secondary | ICD-10-CM

## 2024-06-29 MED ORDER — ATORVASTATIN CALCIUM 40 MG PO TABS
40.0000 mg | ORAL_TABLET | Freq: Every day | ORAL | 1 refills | Status: DC
  Filled 2024-06-29: qty 90, 90d supply, fill #0

## 2024-07-06 ENCOUNTER — Inpatient Hospital Stay (HOSPITAL_COMMUNITY)
Admission: EM | Admit: 2024-07-06 | Discharge: 2024-08-04 | DRG: 025 | Disposition: A | Discharge status: Rehabilitation Facility

## 2024-07-06 DIAGNOSIS — G939 Disorder of brain, unspecified: Secondary | ICD-10-CM | POA: Diagnosis not present

## 2024-07-06 DIAGNOSIS — S31809A Unspecified open wound of unspecified buttock, initial encounter: Secondary | ICD-10-CM | POA: Insufficient documentation

## 2024-07-06 DIAGNOSIS — G4733 Obstructive sleep apnea (adult) (pediatric): Secondary | ICD-10-CM | POA: Diagnosis present

## 2024-07-06 DIAGNOSIS — F32A Depression, unspecified: Secondary | ICD-10-CM | POA: Diagnosis present

## 2024-07-06 DIAGNOSIS — R509 Fever, unspecified: Secondary | ICD-10-CM

## 2024-07-06 DIAGNOSIS — D496 Neoplasm of unspecified behavior of brain: Secondary | ICD-10-CM | POA: Diagnosis present

## 2024-07-06 DIAGNOSIS — M79604 Pain in right leg: Secondary | ICD-10-CM | POA: Insufficient documentation

## 2024-07-06 DIAGNOSIS — R739 Hyperglycemia, unspecified: Secondary | ICD-10-CM | POA: Diagnosis not present

## 2024-07-06 DIAGNOSIS — R001 Bradycardia, unspecified: Secondary | ICD-10-CM | POA: Diagnosis not present

## 2024-07-06 DIAGNOSIS — R197 Diarrhea, unspecified: Secondary | ICD-10-CM | POA: Diagnosis not present

## 2024-07-06 DIAGNOSIS — T380X5A Adverse effect of glucocorticoids and synthetic analogues, initial encounter: Secondary | ICD-10-CM | POA: Diagnosis present

## 2024-07-06 DIAGNOSIS — L24A2 Irritant contact dermatitis due to fecal, urinary or dual incontinence: Secondary | ICD-10-CM | POA: Diagnosis not present

## 2024-07-06 DIAGNOSIS — I422 Other hypertrophic cardiomyopathy: Secondary | ICD-10-CM | POA: Diagnosis present

## 2024-07-06 DIAGNOSIS — G936 Cerebral edema: Secondary | ICD-10-CM | POA: Diagnosis present

## 2024-07-06 DIAGNOSIS — I4891 Unspecified atrial fibrillation: Secondary | ICD-10-CM | POA: Diagnosis present

## 2024-07-06 DIAGNOSIS — T41295A Adverse effect of other general anesthetics, initial encounter: Secondary | ICD-10-CM | POA: Diagnosis not present

## 2024-07-06 DIAGNOSIS — I472 Ventricular tachycardia, unspecified: Secondary | ICD-10-CM | POA: Diagnosis not present

## 2024-07-06 DIAGNOSIS — M19041 Primary osteoarthritis, right hand: Secondary | ICD-10-CM | POA: Diagnosis present

## 2024-07-06 DIAGNOSIS — F05 Delirium due to known physiological condition: Secondary | ICD-10-CM | POA: Diagnosis not present

## 2024-07-06 DIAGNOSIS — Y95 Nosocomial condition: Secondary | ICD-10-CM | POA: Diagnosis not present

## 2024-07-06 DIAGNOSIS — I82461 Acute embolism and thrombosis of right calf muscular vein: Secondary | ICD-10-CM | POA: Diagnosis not present

## 2024-07-06 DIAGNOSIS — R414 Neurologic neglect syndrome: Secondary | ICD-10-CM | POA: Diagnosis present

## 2024-07-06 DIAGNOSIS — R4182 Altered mental status, unspecified: Secondary | ICD-10-CM | POA: Insufficient documentation

## 2024-07-06 DIAGNOSIS — F419 Anxiety disorder, unspecified: Secondary | ICD-10-CM | POA: Diagnosis present

## 2024-07-06 DIAGNOSIS — R29810 Facial weakness: Secondary | ICD-10-CM | POA: Diagnosis present

## 2024-07-06 DIAGNOSIS — R159 Full incontinence of feces: Secondary | ICD-10-CM | POA: Diagnosis not present

## 2024-07-06 DIAGNOSIS — I8290 Acute embolism and thrombosis of unspecified vein: Secondary | ICD-10-CM | POA: Insufficient documentation

## 2024-07-06 DIAGNOSIS — Z8249 Family history of ischemic heart disease and other diseases of the circulatory system: Secondary | ICD-10-CM

## 2024-07-06 DIAGNOSIS — R569 Unspecified convulsions: Secondary | ICD-10-CM | POA: Diagnosis not present

## 2024-07-06 DIAGNOSIS — Z87891 Personal history of nicotine dependence: Secondary | ICD-10-CM

## 2024-07-06 DIAGNOSIS — D75839 Thrombocytosis, unspecified: Secondary | ICD-10-CM | POA: Diagnosis not present

## 2024-07-06 DIAGNOSIS — E785 Hyperlipidemia, unspecified: Secondary | ICD-10-CM | POA: Diagnosis present

## 2024-07-06 DIAGNOSIS — I639 Cerebral infarction, unspecified: Secondary | ICD-10-CM

## 2024-07-06 DIAGNOSIS — Z833 Family history of diabetes mellitus: Secondary | ICD-10-CM

## 2024-07-06 DIAGNOSIS — K5900 Constipation, unspecified: Secondary | ICD-10-CM | POA: Diagnosis not present

## 2024-07-06 DIAGNOSIS — G8324 Monoplegia of upper limb affecting left nondominant side: Secondary | ICD-10-CM | POA: Diagnosis present

## 2024-07-06 DIAGNOSIS — E440 Moderate protein-calorie malnutrition: Secondary | ICD-10-CM | POA: Diagnosis present

## 2024-07-06 DIAGNOSIS — E874 Mixed disorder of acid-base balance: Secondary | ICD-10-CM | POA: Diagnosis present

## 2024-07-06 DIAGNOSIS — J15211 Pneumonia due to Methicillin susceptible Staphylococcus aureus: Secondary | ICD-10-CM | POA: Diagnosis not present

## 2024-07-06 DIAGNOSIS — Z66 Do not resuscitate: Secondary | ICD-10-CM | POA: Diagnosis not present

## 2024-07-06 DIAGNOSIS — G9341 Metabolic encephalopathy: Secondary | ICD-10-CM | POA: Diagnosis present

## 2024-07-06 DIAGNOSIS — J9601 Acute respiratory failure with hypoxia: Secondary | ICD-10-CM | POA: Diagnosis not present

## 2024-07-06 DIAGNOSIS — G9340 Encephalopathy, unspecified: Secondary | ICD-10-CM | POA: Diagnosis present

## 2024-07-06 DIAGNOSIS — C712 Malignant neoplasm of temporal lobe: Principal | ICD-10-CM | POA: Diagnosis present

## 2024-07-06 DIAGNOSIS — M19042 Primary osteoarthritis, left hand: Secondary | ICD-10-CM | POA: Diagnosis present

## 2024-07-06 DIAGNOSIS — C719 Malignant neoplasm of brain, unspecified: Secondary | ICD-10-CM | POA: Insufficient documentation

## 2024-07-06 DIAGNOSIS — G4089 Other seizures: Secondary | ICD-10-CM | POA: Diagnosis present

## 2024-07-06 DIAGNOSIS — H5500 Unspecified nystagmus: Secondary | ICD-10-CM | POA: Diagnosis present

## 2024-07-06 DIAGNOSIS — I10 Essential (primary) hypertension: Secondary | ICD-10-CM | POA: Diagnosis present

## 2024-07-06 DIAGNOSIS — Z79899 Other long term (current) drug therapy: Secondary | ICD-10-CM

## 2024-07-06 DIAGNOSIS — J14 Pneumonia due to Hemophilus influenzae: Secondary | ICD-10-CM | POA: Diagnosis not present

## 2024-07-06 DIAGNOSIS — Z789 Other specified health status: Secondary | ICD-10-CM

## 2024-07-06 DIAGNOSIS — Z6824 Body mass index (BMI) 24.0-24.9, adult: Secondary | ICD-10-CM

## 2024-07-06 DIAGNOSIS — A419 Sepsis, unspecified organism: Secondary | ICD-10-CM

## 2024-07-06 DIAGNOSIS — G9389 Other specified disorders of brain: Principal | ICD-10-CM

## 2024-07-06 DIAGNOSIS — R471 Dysarthria and anarthria: Secondary | ICD-10-CM | POA: Diagnosis present

## 2024-07-06 DIAGNOSIS — R6520 Severe sepsis without septic shock: Secondary | ICD-10-CM

## 2024-07-06 DIAGNOSIS — R339 Retention of urine, unspecified: Secondary | ICD-10-CM | POA: Diagnosis present

## 2024-07-06 DIAGNOSIS — Z515 Encounter for palliative care: Secondary | ICD-10-CM

## 2024-07-06 DIAGNOSIS — Z7901 Long term (current) use of anticoagulants: Secondary | ICD-10-CM

## 2024-07-06 DIAGNOSIS — I480 Paroxysmal atrial fibrillation: Secondary | ICD-10-CM | POA: Diagnosis present

## 2024-07-06 DIAGNOSIS — I959 Hypotension, unspecified: Secondary | ICD-10-CM | POA: Diagnosis not present

## 2024-07-06 MED ORDER — SODIUM CHLORIDE 0.9% FLUSH
3.0000 mL | Freq: Once | INTRAVENOUS | Status: AC
  Administered 2024-07-07: 3 mL via INTRAVENOUS

## 2024-07-06 NOTE — ED Triage Notes (Addendum)
 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 Pt states he felt normal today, but late tonight he felt his bp dropping and it was low when he checked it, felt like he was going to pass out. His coworker states pt was normal when he saw him at the start of the shift at 1530, but noticed he looked weak and lethargic at 2230. Weakness to L arm  and speech is slurred compared to baseline. Code stroke called from triage. Take eliquis  daily for afib

## 2024-07-06 NOTE — ED Notes (Signed)
 Pt taken to CT1 with Therisa PEAK

## 2024-07-07 ENCOUNTER — Inpatient Hospital Stay (HOSPITAL_COMMUNITY)

## 2024-07-07 ENCOUNTER — Other Ambulatory Visit: Payer: Self-pay

## 2024-07-07 ENCOUNTER — Encounter (HOSPITAL_COMMUNITY): Payer: Self-pay

## 2024-07-07 ENCOUNTER — Emergency Department (HOSPITAL_COMMUNITY)

## 2024-07-07 DIAGNOSIS — I6389 Other cerebral infarction: Secondary | ICD-10-CM | POA: Diagnosis not present

## 2024-07-07 DIAGNOSIS — G8324 Monoplegia of upper limb affecting left nondominant side: Secondary | ICD-10-CM | POA: Diagnosis not present

## 2024-07-07 DIAGNOSIS — R471 Dysarthria and anarthria: Secondary | ICD-10-CM | POA: Diagnosis not present

## 2024-07-07 DIAGNOSIS — E440 Moderate protein-calorie malnutrition: Secondary | ICD-10-CM | POA: Diagnosis not present

## 2024-07-07 DIAGNOSIS — K592 Neurogenic bowel, not elsewhere classified: Secondary | ICD-10-CM | POA: Diagnosis not present

## 2024-07-07 DIAGNOSIS — G9341 Metabolic encephalopathy: Secondary | ICD-10-CM | POA: Diagnosis not present

## 2024-07-07 DIAGNOSIS — G9340 Encephalopathy, unspecified: Secondary | ICD-10-CM | POA: Diagnosis present

## 2024-07-07 DIAGNOSIS — Z66 Do not resuscitate: Secondary | ICD-10-CM | POA: Diagnosis not present

## 2024-07-07 DIAGNOSIS — R918 Other nonspecific abnormal finding of lung field: Secondary | ICD-10-CM | POA: Diagnosis not present

## 2024-07-07 DIAGNOSIS — Z7901 Long term (current) use of anticoagulants: Secondary | ICD-10-CM

## 2024-07-07 DIAGNOSIS — R9089 Other abnormal findings on diagnostic imaging of central nervous system: Secondary | ICD-10-CM | POA: Diagnosis not present

## 2024-07-07 DIAGNOSIS — Z79899 Other long term (current) drug therapy: Secondary | ICD-10-CM | POA: Diagnosis not present

## 2024-07-07 DIAGNOSIS — J15211 Pneumonia due to Methicillin susceptible Staphylococcus aureus: Secondary | ICD-10-CM | POA: Diagnosis not present

## 2024-07-07 DIAGNOSIS — R569 Unspecified convulsions: Secondary | ICD-10-CM | POA: Diagnosis not present

## 2024-07-07 DIAGNOSIS — R414 Neurologic neglect syndrome: Secondary | ICD-10-CM | POA: Diagnosis not present

## 2024-07-07 DIAGNOSIS — Z4682 Encounter for fitting and adjustment of non-vascular catheter: Secondary | ICD-10-CM | POA: Diagnosis not present

## 2024-07-07 DIAGNOSIS — J90 Pleural effusion, not elsewhere classified: Secondary | ICD-10-CM | POA: Diagnosis not present

## 2024-07-07 DIAGNOSIS — M542 Cervicalgia: Secondary | ICD-10-CM | POA: Diagnosis not present

## 2024-07-07 DIAGNOSIS — R29704 NIHSS score 4: Secondary | ICD-10-CM | POA: Diagnosis not present

## 2024-07-07 DIAGNOSIS — E785 Hyperlipidemia, unspecified: Secondary | ICD-10-CM

## 2024-07-07 DIAGNOSIS — I82461 Acute embolism and thrombosis of right calf muscular vein: Secondary | ICD-10-CM | POA: Diagnosis not present

## 2024-07-07 DIAGNOSIS — D497 Neoplasm of unspecified behavior of endocrine glands and other parts of nervous system: Secondary | ICD-10-CM | POA: Diagnosis not present

## 2024-07-07 DIAGNOSIS — I480 Paroxysmal atrial fibrillation: Secondary | ICD-10-CM | POA: Diagnosis not present

## 2024-07-07 DIAGNOSIS — J811 Chronic pulmonary edema: Secondary | ICD-10-CM | POA: Diagnosis not present

## 2024-07-07 DIAGNOSIS — I4891 Unspecified atrial fibrillation: Secondary | ICD-10-CM

## 2024-07-07 DIAGNOSIS — I517 Cardiomegaly: Secondary | ICD-10-CM | POA: Diagnosis not present

## 2024-07-07 DIAGNOSIS — J9691 Respiratory failure, unspecified with hypoxia: Secondary | ICD-10-CM | POA: Diagnosis not present

## 2024-07-07 DIAGNOSIS — I4821 Permanent atrial fibrillation: Secondary | ICD-10-CM

## 2024-07-07 DIAGNOSIS — M79661 Pain in right lower leg: Secondary | ICD-10-CM | POA: Diagnosis not present

## 2024-07-07 DIAGNOSIS — Z87891 Personal history of nicotine dependence: Secondary | ICD-10-CM | POA: Diagnosis not present

## 2024-07-07 DIAGNOSIS — I10 Essential (primary) hypertension: Secondary | ICD-10-CM | POA: Diagnosis not present

## 2024-07-07 DIAGNOSIS — I700 Atherosclerosis of aorta: Secondary | ICD-10-CM | POA: Diagnosis not present

## 2024-07-07 DIAGNOSIS — Z8249 Family history of ischemic heart disease and other diseases of the circulatory system: Secondary | ICD-10-CM | POA: Diagnosis not present

## 2024-07-07 DIAGNOSIS — Z515 Encounter for palliative care: Secondary | ICD-10-CM | POA: Diagnosis not present

## 2024-07-07 DIAGNOSIS — C719 Malignant neoplasm of brain, unspecified: Secondary | ICD-10-CM | POA: Diagnosis not present

## 2024-07-07 DIAGNOSIS — F05 Delirium due to known physiological condition: Secondary | ICD-10-CM | POA: Diagnosis not present

## 2024-07-07 DIAGNOSIS — E876 Hypokalemia: Secondary | ICD-10-CM | POA: Diagnosis not present

## 2024-07-07 DIAGNOSIS — I959 Hypotension, unspecified: Secondary | ICD-10-CM | POA: Diagnosis not present

## 2024-07-07 DIAGNOSIS — R7401 Elevation of levels of liver transaminase levels: Secondary | ICD-10-CM | POA: Diagnosis not present

## 2024-07-07 DIAGNOSIS — G4089 Other seizures: Secondary | ICD-10-CM | POA: Diagnosis not present

## 2024-07-07 DIAGNOSIS — G4733 Obstructive sleep apnea (adult) (pediatric): Secondary | ICD-10-CM | POA: Diagnosis not present

## 2024-07-07 DIAGNOSIS — R269 Unspecified abnormalities of gait and mobility: Secondary | ICD-10-CM | POA: Diagnosis not present

## 2024-07-07 DIAGNOSIS — G9349 Other encephalopathy: Secondary | ICD-10-CM | POA: Diagnosis not present

## 2024-07-07 DIAGNOSIS — C712 Malignant neoplasm of temporal lobe: Secondary | ICD-10-CM | POA: Diagnosis not present

## 2024-07-07 DIAGNOSIS — Y95 Nosocomial condition: Secondary | ICD-10-CM | POA: Diagnosis not present

## 2024-07-07 DIAGNOSIS — E1165 Type 2 diabetes mellitus with hyperglycemia: Secondary | ICD-10-CM | POA: Diagnosis not present

## 2024-07-07 DIAGNOSIS — Z7189 Other specified counseling: Secondary | ICD-10-CM | POA: Diagnosis not present

## 2024-07-07 DIAGNOSIS — R451 Restlessness and agitation: Secondary | ICD-10-CM

## 2024-07-07 DIAGNOSIS — N319 Neuromuscular dysfunction of bladder, unspecified: Secondary | ICD-10-CM | POA: Diagnosis not present

## 2024-07-07 DIAGNOSIS — G40901 Epilepsy, unspecified, not intractable, with status epilepticus: Secondary | ICD-10-CM | POA: Diagnosis not present

## 2024-07-07 DIAGNOSIS — R6520 Severe sepsis without septic shock: Secondary | ICD-10-CM | POA: Diagnosis not present

## 2024-07-07 DIAGNOSIS — E874 Mixed disorder of acid-base balance: Secondary | ICD-10-CM | POA: Diagnosis not present

## 2024-07-07 DIAGNOSIS — I482 Chronic atrial fibrillation, unspecified: Secondary | ICD-10-CM | POA: Diagnosis not present

## 2024-07-07 DIAGNOSIS — I472 Ventricular tachycardia, unspecified: Secondary | ICD-10-CM | POA: Diagnosis not present

## 2024-07-07 DIAGNOSIS — R739 Hyperglycemia, unspecified: Secondary | ICD-10-CM

## 2024-07-07 DIAGNOSIS — E878 Other disorders of electrolyte and fluid balance, not elsewhere classified: Secondary | ICD-10-CM | POA: Diagnosis not present

## 2024-07-07 DIAGNOSIS — I8290 Acute embolism and thrombosis of unspecified vein: Secondary | ICD-10-CM | POA: Diagnosis not present

## 2024-07-07 DIAGNOSIS — I639 Cerebral infarction, unspecified: Secondary | ICD-10-CM

## 2024-07-07 DIAGNOSIS — T380X5A Adverse effect of glucocorticoids and synthetic analogues, initial encounter: Secondary | ICD-10-CM | POA: Diagnosis not present

## 2024-07-07 DIAGNOSIS — D496 Neoplasm of unspecified behavior of brain: Secondary | ICD-10-CM | POA: Diagnosis not present

## 2024-07-07 DIAGNOSIS — G9389 Other specified disorders of brain: Principal | ICD-10-CM

## 2024-07-07 DIAGNOSIS — R5381 Other malaise: Secondary | ICD-10-CM | POA: Diagnosis not present

## 2024-07-07 DIAGNOSIS — A419 Sepsis, unspecified organism: Secondary | ICD-10-CM | POA: Diagnosis not present

## 2024-07-07 DIAGNOSIS — I63511 Cerebral infarction due to unspecified occlusion or stenosis of right middle cerebral artery: Secondary | ICD-10-CM

## 2024-07-07 DIAGNOSIS — K5900 Constipation, unspecified: Secondary | ICD-10-CM | POA: Diagnosis not present

## 2024-07-07 DIAGNOSIS — I422 Other hypertrophic cardiomyopathy: Secondary | ICD-10-CM | POA: Diagnosis not present

## 2024-07-07 DIAGNOSIS — L24A2 Irritant contact dermatitis due to fecal, urinary or dual incontinence: Secondary | ICD-10-CM | POA: Diagnosis not present

## 2024-07-07 DIAGNOSIS — R159 Full incontinence of feces: Secondary | ICD-10-CM | POA: Diagnosis not present

## 2024-07-07 DIAGNOSIS — J9601 Acute respiratory failure with hypoxia: Secondary | ICD-10-CM | POA: Diagnosis not present

## 2024-07-07 DIAGNOSIS — G936 Cerebral edema: Secondary | ICD-10-CM | POA: Diagnosis not present

## 2024-07-07 DIAGNOSIS — R1312 Dysphagia, oropharyngeal phase: Secondary | ICD-10-CM | POA: Diagnosis not present

## 2024-07-07 DIAGNOSIS — R55 Syncope and collapse: Secondary | ICD-10-CM | POA: Diagnosis not present

## 2024-07-07 DIAGNOSIS — J189 Pneumonia, unspecified organism: Secondary | ICD-10-CM | POA: Diagnosis not present

## 2024-07-07 DIAGNOSIS — R4589 Other symptoms and signs involving emotional state: Secondary | ICD-10-CM | POA: Diagnosis not present

## 2024-07-07 DIAGNOSIS — F32A Depression, unspecified: Secondary | ICD-10-CM | POA: Diagnosis not present

## 2024-07-07 DIAGNOSIS — Z789 Other specified health status: Secondary | ICD-10-CM

## 2024-07-07 DIAGNOSIS — R4182 Altered mental status, unspecified: Secondary | ICD-10-CM | POA: Diagnosis not present

## 2024-07-07 DIAGNOSIS — F419 Anxiety disorder, unspecified: Secondary | ICD-10-CM | POA: Diagnosis not present

## 2024-07-07 DIAGNOSIS — R509 Fever, unspecified: Secondary | ICD-10-CM | POA: Diagnosis not present

## 2024-07-07 DIAGNOSIS — I509 Heart failure, unspecified: Secondary | ICD-10-CM | POA: Diagnosis not present

## 2024-07-07 DIAGNOSIS — G40909 Epilepsy, unspecified, not intractable, without status epilepticus: Secondary | ICD-10-CM | POA: Diagnosis not present

## 2024-07-07 DIAGNOSIS — L89152 Pressure ulcer of sacral region, stage 2: Secondary | ICD-10-CM | POA: Diagnosis not present

## 2024-07-07 DIAGNOSIS — J14 Pneumonia due to Hemophilus influenzae: Secondary | ICD-10-CM | POA: Diagnosis not present

## 2024-07-07 DIAGNOSIS — Z452 Encounter for adjustment and management of vascular access device: Secondary | ICD-10-CM | POA: Diagnosis not present

## 2024-07-07 DIAGNOSIS — R0989 Other specified symptoms and signs involving the circulatory and respiratory systems: Secondary | ICD-10-CM | POA: Diagnosis not present

## 2024-07-07 LAB — COMPREHENSIVE METABOLIC PANEL WITH GFR
ALT: 24 U/L (ref 0–44)
AST: 32 U/L (ref 15–41)
Albumin: 4.1 g/dL (ref 3.5–5.0)
Alkaline Phosphatase: 53 U/L (ref 38–126)
Anion gap: 10 (ref 5–15)
BUN: 21 mg/dL (ref 8–23)
CO2: 25 mmol/L (ref 22–32)
Calcium: 9.7 mg/dL (ref 8.9–10.3)
Chloride: 102 mmol/L (ref 98–111)
Creatinine, Ser: 1.02 mg/dL (ref 0.61–1.24)
GFR, Estimated: >60 mL/min (ref >60)
Glucose, Bld: 125 mg/dL — ABNORMAL HIGH (ref 70–99)
Potassium: 4.4 mmol/L (ref 3.5–5.1)
Sodium: 137 mmol/L (ref 135–145)
Total Bilirubin: 1.7 mg/dL — ABNORMAL HIGH (ref 0.0–1.2)
Total Protein: 7.3 g/dL (ref 6.5–8.1)

## 2024-07-07 LAB — ECHOCARDIOGRAM COMPLETE
Height: 69 in
S' Lateral: 2.88 cm
Weight: 2608 [oz_av]

## 2024-07-07 LAB — DIFFERENTIAL
Abs Immature Granulocytes: 0.01 K/uL (ref 0.00–0.07)
Basophils Absolute: 0.1 K/uL (ref 0.0–0.1)
Basophils Relative: 1 %
Eosinophils Absolute: 0.2 K/uL (ref 0.0–0.5)
Eosinophils Relative: 3 %
Immature Granulocytes: 0 %
Lymphocytes Relative: 37 %
Lymphs Abs: 3 K/uL (ref 0.7–4.0)
Monocytes Absolute: 0.9 K/uL (ref 0.1–1.0)
Monocytes Relative: 12 %
Neutro Abs: 3.9 K/uL (ref 1.7–7.7)
Neutrophils Relative %: 47 %

## 2024-07-07 LAB — CBC
HCT: 46.8 % (ref 39.0–52.0)
HCT: 47.2 % (ref 39.0–52.0)
Hemoglobin: 15.5 g/dL (ref 13.0–17.0)
Hemoglobin: 15.6 g/dL (ref 13.0–17.0)
MCH: 30.9 pg (ref 26.0–34.0)
MCH: 31 pg (ref 26.0–34.0)
MCHC: 32.8 g/dL (ref 30.0–36.0)
MCHC: 33.3 g/dL (ref 30.0–36.0)
MCV: 92.9 fL (ref 80.0–100.0)
MCV: 94.2 fL (ref 80.0–100.0)
Platelets: 224 K/uL (ref 150–400)
Platelets: 250 K/uL (ref 150–400)
RBC: 5.01 MIL/uL (ref 4.22–5.81)
RBC: 5.04 MIL/uL (ref 4.22–5.81)
RDW: 12.6 % (ref 11.5–15.5)
RDW: 12.7 % (ref 11.5–15.5)
WBC: 13.4 K/uL — ABNORMAL HIGH (ref 4.0–10.5)
WBC: 8.2 K/uL (ref 4.0–10.5)
nRBC: 0 % (ref 0.0–0.2)
nRBC: 0 % (ref 0.0–0.2)

## 2024-07-07 LAB — MAGNESIUM: Magnesium: 2 mg/dL (ref 1.7–2.4)

## 2024-07-07 LAB — TSH: TSH: 1.615 u[IU]/mL (ref 0.350–4.500)

## 2024-07-07 LAB — PROTIME-INR
INR: 1.1 (ref 0.8–1.2)
Prothrombin Time: 14.8 s (ref 11.4–15.2)

## 2024-07-07 LAB — CBG MONITORING, ED
Glucose-Capillary: 121 mg/dL — ABNORMAL HIGH (ref 70–99)
Glucose-Capillary: 139 mg/dL — ABNORMAL HIGH (ref 70–99)

## 2024-07-07 LAB — BASIC METABOLIC PANEL WITH GFR
Anion gap: 10 (ref 5–15)
BUN: 20 mg/dL (ref 8–23)
CO2: 23 mmol/L (ref 22–32)
Calcium: 9 mg/dL (ref 8.9–10.3)
Chloride: 103 mmol/L (ref 98–111)
Creatinine, Ser: 1.08 mg/dL (ref 0.61–1.24)
GFR, Estimated: >60 mL/min (ref >60)
Glucose, Bld: 170 mg/dL — ABNORMAL HIGH (ref 70–99)
Potassium: 5.1 mmol/L (ref 3.5–5.1)
Sodium: 136 mmol/L (ref 135–145)

## 2024-07-07 LAB — LIPID PANEL
Cholesterol: 114 mg/dL (ref 0–200)
HDL: 44 mg/dL (ref >40)
LDL Cholesterol: 62 mg/dL (ref 0–99)
Total CHOL/HDL Ratio: 2.6 ratio
Triglycerides: 40 mg/dL (ref <150)
VLDL: 8 mg/dL (ref 0–40)

## 2024-07-07 LAB — I-STAT CHEM 8, ED
BUN: 24 mg/dL — ABNORMAL HIGH (ref 8–23)
Calcium, Ion: 1.23 mmol/L (ref 1.15–1.40)
Chloride: 102 mmol/L (ref 98–111)
Creatinine, Ser: 1 mg/dL (ref 0.61–1.24)
Glucose, Bld: 117 mg/dL — ABNORMAL HIGH (ref 70–99)
HCT: 46 % (ref 39.0–52.0)
Hemoglobin: 15.6 g/dL (ref 13.0–17.0)
Potassium: 4.2 mmol/L (ref 3.5–5.1)
Sodium: 138 mmol/L (ref 135–145)
TCO2: 25 mmol/L (ref 22–32)

## 2024-07-07 LAB — HEMOGLOBIN A1C
Hgb A1c MFr Bld: 6 % — ABNORMAL HIGH (ref 4.8–5.6)
Mean Plasma Glucose: 126 mg/dL

## 2024-07-07 LAB — MRSA NEXT GEN BY PCR, NASAL: MRSA by PCR Next Gen: NOT DETECTED

## 2024-07-07 LAB — ETHANOL: Alcohol, Ethyl (B): <15 mg/dL (ref <15)

## 2024-07-07 LAB — GLUCOSE, CAPILLARY
Glucose-Capillary: 76 mg/dL (ref 70–99)
Glucose-Capillary: 91 mg/dL (ref 70–99)

## 2024-07-07 LAB — APTT: aPTT: 36 s (ref 24–36)

## 2024-07-07 LAB — HIV ANTIBODY (ROUTINE TESTING W REFLEX): HIV Screen 4th Generation wRfx: NONREACTIVE

## 2024-07-07 MED ORDER — MIDAZOLAM HCL 2 MG/2ML IJ SOLN
INTRAMUSCULAR | Status: DC | PRN
  Administered 2024-07-07: 2 mg via INTRAVENOUS

## 2024-07-07 MED ORDER — CLEVIDIPINE BUTYRATE 0.5 MG/ML IV EMUL
0.0000 mg/h | INTRAVENOUS | Status: DC
  Filled 2024-07-07: qty 100

## 2024-07-07 MED ORDER — PROPOFOL 1000 MG/100ML IV EMUL
INTRAVENOUS | Status: AC
  Administered 2024-07-07: 30 ug/kg/min via INTRAVENOUS
  Filled 2024-07-07: qty 100

## 2024-07-07 MED ORDER — LORAZEPAM 2 MG/ML IJ SOLN
2.0000 mg | Freq: Once | INTRAMUSCULAR | Status: AC | PRN
  Administered 2024-07-07: 2 mg via INTRAVENOUS
  Filled 2024-07-07: qty 1

## 2024-07-07 MED ORDER — ORAL CARE MOUTH RINSE
15.0000 mL | OROMUCOSAL | Status: DC
Start: 2024-07-07 — End: 2024-07-13
  Administered 2024-07-07 – 2024-07-13 (×69): 15 mL via OROMUCOSAL

## 2024-07-07 MED ORDER — LORAZEPAM 2 MG/ML IJ SOLN
INTRAMUSCULAR | Status: AC
  Administered 2024-07-07: 2 mg via INTRAVENOUS
  Filled 2024-07-07: qty 1

## 2024-07-07 MED ORDER — CHLORHEXIDINE GLUCONATE CLOTH 2 % EX PADS
6.0000 | MEDICATED_PAD | Freq: Every day | CUTANEOUS | Status: DC
  Administered 2024-07-07 – 2024-07-17 (×11): 6 via TOPICAL

## 2024-07-07 MED ORDER — LEVETIRACETAM (KEPPRA) 500 MG/5 ML ADULT IV PUSH
500.0000 mg | Freq: Two times a day (BID) | INTRAVENOUS | Status: DC
  Administered 2024-07-07 (×2): 500 mg via INTRAVENOUS
  Filled 2024-07-07 (×2): qty 5

## 2024-07-07 MED ORDER — FENTANYL CITRATE PF 50 MCG/ML IJ SOSY
50.0000 ug | PREFILLED_SYRINGE | INTRAMUSCULAR | Status: DC | PRN
  Administered 2024-07-07 – 2024-07-09 (×6): 50 ug via INTRAVENOUS
  Administered 2024-07-10: 100 ug via INTRAVENOUS
  Administered 2024-07-10: 50 ug via INTRAVENOUS
  Filled 2024-07-07: qty 1
  Filled 2024-07-07: qty 2
  Filled 2024-07-07 (×5): qty 1
  Filled 2024-07-07: qty 2
  Filled 2024-07-07: qty 1

## 2024-07-07 MED ORDER — HEPARIN (PORCINE) 25000 UT/250ML-% IV SOLN
1500.0000 [IU]/h | INTRAVENOUS | Status: DC
  Administered 2024-07-07 – 2024-07-12 (×6): 1100 [IU]/h via INTRAVENOUS
  Administered 2024-07-13: 1150 [IU]/h via INTRAVENOUS
  Administered 2024-07-14: 1400 [IU]/h via INTRAVENOUS
  Administered 2024-07-15 – 2024-07-16 (×2): 1500 [IU]/h via INTRAVENOUS
  Filled 2024-07-07 (×10): qty 250

## 2024-07-07 MED ORDER — IOHEXOL 350 MG/ML SOLN
75.0000 mL | Freq: Once | INTRAVENOUS | Status: AC | PRN
Start: 2024-07-07 — End: 2024-07-07
  Administered 2024-07-07: 75 mL via INTRAVENOUS

## 2024-07-07 MED ORDER — MIDAZOLAM HCL 2 MG/2ML IJ SOLN
1.0000 mg | INTRAMUSCULAR | Status: DC | PRN
  Administered 2024-07-08 – 2024-07-09 (×2): 2 mg via INTRAVENOUS
  Filled 2024-07-07 (×3): qty 2

## 2024-07-07 MED ORDER — LEVETIRACETAM (KEPPRA) 500 MG/5 ML ADULT IV PUSH
60.0000 mg/kg | Freq: Once | INTRAVENOUS | Status: AC
  Administered 2024-07-07: 4500 mg via INTRAVENOUS
  Filled 2024-07-07: qty 45

## 2024-07-07 MED ORDER — ETOMIDATE 2 MG/ML IV SOLN
INTRAVENOUS | Status: AC | PRN
  Administered 2024-07-07: 20 mg via INTRAVENOUS

## 2024-07-07 MED ORDER — HYDRALAZINE HCL 20 MG/ML IJ SOLN
20.0000 mg | Freq: Once | INTRAMUSCULAR | Status: AC
  Administered 2024-07-07: 20 mg via INTRAVENOUS

## 2024-07-07 MED ORDER — PROPOFOL 1000 MG/100ML IV EMUL
0.0000 ug/kg/min | INTRAVENOUS | Status: DC
  Administered 2024-07-07: 30 ug/kg/min via INTRAVENOUS
  Filled 2024-07-07: qty 100

## 2024-07-07 MED ORDER — FENTANYL CITRATE PF 50 MCG/ML IJ SOSY
PREFILLED_SYRINGE | INTRAMUSCULAR | Status: AC
Start: 2024-07-07 — End: 2024-07-08
  Filled 2024-07-07: qty 2

## 2024-07-07 MED ORDER — ORAL CARE MOUTH RINSE: 15.0000 mL | OROMUCOSAL | Status: DC | PRN

## 2024-07-07 MED ORDER — DOCUSATE SODIUM 50 MG/5ML PO LIQD
100.0000 mg | Freq: Two times a day (BID) | ORAL | Status: DC
Start: 2024-07-07 — End: 2024-07-11
  Administered 2024-07-07 – 2024-07-10 (×8): 100 mg
  Filled 2024-07-07 (×8): qty 10

## 2024-07-07 MED ORDER — LORAZEPAM 2 MG/ML IJ SOLN: 1.0000 mg | Freq: Once | INTRAMUSCULAR | Status: DC

## 2024-07-07 MED ORDER — FAMOTIDINE 20 MG PO TABS
20.0000 mg | ORAL_TABLET | Freq: Two times a day (BID) | ORAL | Status: DC
  Administered 2024-07-07 – 2024-07-14 (×15): 20 mg
  Filled 2024-07-07 (×15): qty 1

## 2024-07-07 MED ORDER — ASPIRIN 300 MG RE SUPP
150.0000 mg | Freq: Every day | RECTAL | Status: DC
  Administered 2024-07-07: 150 mg via RECTAL
  Filled 2024-07-07: qty 1

## 2024-07-07 MED ORDER — FENTANYL CITRATE (PF) 100 MCG/2ML IJ SOLN
INTRAMUSCULAR | Status: AC | PRN
  Administered 2024-07-07: 100 ug via INTRAVENOUS

## 2024-07-07 MED ORDER — POLYETHYLENE GLYCOL 3350 17 G PO PACK
17.0000 g | PACK | Freq: Every day | ORAL | Status: DC
Start: 2024-07-07 — End: 2024-07-10
  Administered 2024-07-07 – 2024-07-10 (×3): 17 g
  Filled 2024-07-07 (×2): qty 1

## 2024-07-07 MED ORDER — ONDANSETRON HCL 4 MG/2ML IJ SOLN
4.0000 mg | Freq: Once | INTRAMUSCULAR | Status: AC
  Administered 2024-07-07: 4 mg via INTRAVENOUS
  Filled 2024-07-07: qty 2

## 2024-07-07 MED ORDER — GADOBUTROL 1 MMOL/ML IV SOLN
7.5000 mL | Freq: Once | INTRAVENOUS | Status: AC | PRN
  Administered 2024-07-07: 7.5 mL via INTRAVENOUS

## 2024-07-07 MED ORDER — LEVETIRACETAM (KEPPRA) 500 MG/5 ML ADULT IV PUSH: 20.0000 mg/kg | Freq: Once | INTRAVENOUS | Status: DC

## 2024-07-07 MED ORDER — LABETALOL HCL 5 MG/ML IV SOLN
20.0000 mg | Freq: Once | INTRAVENOUS | Status: AC
  Administered 2024-07-07: 20 mg via INTRAVENOUS
  Filled 2024-07-07: qty 4

## 2024-07-07 MED ORDER — SODIUM CHLORIDE 0.9 % IV BOLUS
1000.0000 mL | Freq: Once | INTRAVENOUS | Status: AC
  Administered 2024-07-07: 1000 mL via INTRAVENOUS

## 2024-07-07 MED ORDER — FENTANYL CITRATE PF 50 MCG/ML IJ SOSY
50.0000 ug | PREFILLED_SYRINGE | INTRAMUSCULAR | Status: AC | PRN
  Administered 2024-07-08 – 2024-07-09 (×3): 50 ug via INTRAVENOUS
  Filled 2024-07-07 (×3): qty 1

## 2024-07-07 MED ORDER — MIDAZOLAM HCL 2 MG/2ML IJ SOLN
INTRAMUSCULAR | Status: AC
  Filled 2024-07-07: qty 2

## 2024-07-07 MED ORDER — ACETAMINOPHEN 160 MG/5ML PO SOLN
650.0000 mg | ORAL | Status: AC | PRN
  Administered 2024-07-07 – 2024-07-09 (×8): 650 mg
  Filled 2024-07-07 (×8): qty 20.3

## 2024-07-07 MED ORDER — EMPTY CONTAINERS FLEXIBLE MISC: 900.0000 mg | Freq: Once | Status: DC

## 2024-07-07 MED ORDER — PROPOFOL 10 MG/ML IV BOLUS
INTRAVENOUS | Status: DC | PRN
  Administered 2024-07-07: 30 mg via INTRAVENOUS

## 2024-07-07 MED ORDER — ATORVASTATIN CALCIUM 40 MG PO TABS
40.0000 mg | ORAL_TABLET | Freq: Every day | ORAL | Status: DC
  Filled 2024-07-07: qty 1

## 2024-07-07 MED ORDER — LORAZEPAM 2 MG/ML IJ SOLN: 2.0000 mg | Freq: Once | INTRAMUSCULAR | Status: AC

## 2024-07-07 NOTE — Progress Notes (Signed)
 I have discussed the case with the primary team and Neurology.  I will come by tomorrow and talk to him (if extubated) and his wife about the biopsy.  Dino Sable, MD Neurosurgeon

## 2024-07-07 NOTE — Assessment & Plan Note (Addendum)
 HTN - holding Losartan  100mg  daily, permissive HTN HLD - continue home Atorvastatin  40mg  daily Afib - holding home Eliquis  5mg  BID

## 2024-07-07 NOTE — Consult Note (Addendum)
 NEUROLOGY CONSULT NOTE   Date of service: July 07, 2024 Patient Name: Wayne Molina MRN:  [id number] DOB:  13-Sep-1956 Chief Complaint: L sided weakness and hemianopsia Requesting Provider: Bari Charmaine FALCON, MD  History of Present Illness  Wayne Molina is a 68 y.o. 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  He is on eliquis  and took his eliquis  today. LKW reported to be around 2230 on 07/06/24.  He works at the hospital. Was noted to be pale by his coworkers and felt very dizzy.  He is compliant with eliquis  and has not missed any doses. He takes 5mg  twice a day.  LKW: 2230 on 07/06/24 Modified rankin score: 0-Completely asymptomatic and back to baseline post- stroke IV Thrombolysis: not offered, patient is on eliquis . EVT: not offered, no LVO.  NIHSS components Score: Comment  1a Level of Conscious 0[x]  1[]  2[]  3[]      1b LOC Questions 0[x]  1[]  2[]       1c LOC Commands 0[x]  1[]  2[]       2 Best Gaze 0[x]  1[]  2[]       3 Visual 0[]  1[]  2[x]  3[]      4 Facial Palsy 0[x]  1[]  2[]  3[]      5a Motor Arm - left 0[]  1[x]  2[]  3[]  4[]  UN[]    5b Motor Arm - Right 0[x]  1[]  2[]  3[]  4[]  UN[]    6a Motor Leg - Left 0[x]  1[]  2[]  3[]  4[]  UN[]    6b Motor Leg - Right 0[x]  1[]  2[]  3[]  4[]  UN[]    7 Limb Ataxia 0[x]  1[]  2[]  UN[]      8 Sensory 0[]  1[x]  2[]  UN[]      9 Best Language 0[x]  1[]  2[]  3[]      10 Dysarthria 0[x]  1[]  2[]  UN[]      11 Extinct. and Inattention 0[x]  1[]  2[]       TOTAL: 4      ROS  Comprehensive ROS performed and pertinent positives documented in HPI   Past History   Past Medical History:  Diagnosis Date   Anxiety    Arthritis    fingers   Chronic atrial fibrillation (HCC)    Depression    Family history of breast cancer    Family history of cancer of male genital organ    Family history of gene mutation    BRIP1   Family history of malignant neoplasm of gastrointestinal tract     Hyperlipidemia    Hypertension    Personal history of colonic polyps 11/12/2006   Sleep apnea     Past Surgical History:  Procedure Laterality Date   2-D echocardiogram  07/22/2010   Ejection fraction greater than 55%. Left atrium moderately dilated. Right atrium moderately dilated. 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 Mild to Moderate MR. Mild to moderate TR.   ATRIAL ABLATION SURGERY  2004   EP IMPLANTABLE DEVICE N/A 02/11/2016   Procedure: Loop Recorder Insertion;  Surgeon: Jerel Balding, MD;  Location: [facility];  Service: Cardiovascular;  Laterality: N/A;   Exercise Myoview stress test  07/08/2000   Nonischemic low-risk.   HIP FRACTURE SURGERY  1970s   IR GENERIC HISTORICAL  01/21/2017   IR RADIOLOGIST EVAL & MGMT 01/21/2017 [facility]    Family History: Family History  Problem Relation Age of Onset   Dementia Mother    Heart disease Father    Diabetes Father    Other Sister  BRIP1 gene mutation   Kidney disease Brother    Colon cancer Maternal Uncle    Lung cancer Paternal Aunt        d. >50   Cancer Paternal Aunt        unknown type, d. >50   Cancer Cousin 17       gynecologic (paternal first cousin)   Colon cancer Nephew 27       arising in colon polyp   Cancer Niece 53       gynecologic; niece in her 2's   Breast cancer Niece 33   Other Niece        BRIP1 gene mutation   Cancer Paternal Great-grandmother        abdominal cancer (PGF's mother) great grandmother    Social History  reports that he has quit smoking. His smoking use included cigarettes. He has never used smokeless tobacco. He reports that he does not drink alcohol and does not use drugs.  No Known Allergies  Medications   Current Facility-Administered Medications:    sodium chloride  flush (NS) 0.9 % injection 3 mL, 3 mL, Intravenous, Once, Horton, Charmaine FALCON, MD  Current Outpatient Medications:    apixaban  (ELIQUIS ) 5 MG TABS tablet, Take 1 tablet (5 mg total) by mouth 2 (two)  times daily., Disp: 180 tablet, Rfl: 1   atorvastatin  (LIPITOR) 40 MG tablet, Take 1 tablet (40 mg total) by mouth daily., Disp: 90 tablet, Rfl: 1   calcium  carbonate (OSCAL) 1500 (600 Ca) MG TABS tablet, Take by mouth 2 (two) times daily with a meal., Disp: , Rfl:    Cyanocobalamin (VITAMIN B-12 PO), Take 1 capsule by mouth daily., Disp: , Rfl:    cyclobenzaprine  (FLEXERIL ) 5 MG tablet, Take 1 tablet (5 mg total) by mouth 2 (two) times daily as needed for muscle spasms., Disp: 10 tablet, Rfl: 0   lidocaine  (LIDODERM ) 5 %, Place 1 patch onto the skin daily. Apply to affected area on neck/shoulder and keep in place for 12 hours and remove for 12 hours, Disp: 10 patch, Rfl: 0   losartan  (COZAAR ) 100 MG tablet, Take 1 tablet (100 mg total) by mouth daily., Disp: 90 tablet, Rfl: 3   Magnesium Oxide 250 MG TABS, Take 1 tablet by mouth daily in the afternoon., Disp: , Rfl:    Multiple Vitamin (MULTIVITAMIN ADULT PO), Take 1 tablet by mouth daily., Disp: , Rfl:    Potassium Gluconate 2 MEQ TABS, Take 1 tablet by mouth daily in the afternoon., Disp: , Rfl:    sildenafil  (VIAGRA ) 50 MG tablet, Take 2 tablets (100 mg total) by mouth daily as needed., Disp: 10 tablet, Rfl: 2  Vitals   Vitals:   07/06/24 2359  BP: (!) 161/116  Pulse: 72  SpO2: 98%    There is no height or weight on file to calculate BMI.   Physical Exam   General: Laying comfortably in bed; in no acute distress.  HENT: Normal oropharynx and mucosa. Normal external appearance of ears and nose.  Neck: Supple, no pain or tenderness  CV: No JVD. No peripheral edema.  Pulmonary: Symmetric Chest rise. Normal respiratory effort.  Abdomen: Soft to touch, non-tender.  Ext: No cyanosis, edema, or deformity  Skin: No rash. Normal palpation of skin.   Musculoskeletal: Normal digits and nails by inspection. No clubbing.   Neurologic Examination  Mental status/Cognition: Alert, oriented to self, place, month and year, good attention.   Speech/language: Fluent, comprehension intact, object naming intact, repetition  intact.  Cranial nerves:   CN II Pupils equal and reactive to light, L hemianopsia   CN III,IV,VI EOM intact, no gaze preference or deviation, no nystagmus    CN V normal sensation in V1, V2, and V3 segments bilaterally    CN VII no asymmetry, no nasolabial fold flattening    CN VIII normal hearing to speech    CN IX & X normal palatal elevation, no uvular deviation    CN XI 5/5 head turn and 5/5 shoulder shrug bilaterally    CN XII midline tongue protrusion    Motor:  Muscle bulk: normal, tone normal, pronator drift noted in LUE Mvmt Root Nerve  Muscle Right Left Comments  SA C5/6 Ax Deltoid 5 4   EF C5/6 Mc Biceps 5 5   EE C6/7/8 Rad Triceps 5 5   WF C6/7 Med FCR     WE C7/8 PIN ECU     F Ab C8/T1 U ADM/FDI 5 5   HF L1/2/3 Fem Illopsoas 5 5   KE L2/3/4 Fem Quad 5 5   DF L4/5 D Peron Tib Ant 5 5   PF S1/2 Tibial Grc/Sol 5 5    Sensation:  Light touch Decreased regard to touch in LUE.   Pin prick    Temperature    Vibration   Proprioception    Coordination/Complex Motor:  - Finger to Nose intact BL - Heel to shin intact BL - Rapid alternating movement are slowed on the left. - Gait: deferred.  Labs/Imaging/Neurodiagnostic studies   CBC: No results for input(s): WBC, NEUTROABS, HGB, HCT, MCV, PLT in the last 168 hours. Basic Metabolic Panel:  Lab Results  Component Value Date   NA 139 03/07/2024   K 4.8 03/07/2024   CO2 25 03/07/2024   GLUCOSE 93 03/07/2024   BUN 17 03/07/2024   CREATININE 1.08 03/07/2024   CALCIUM  10.5 (H) 03/07/2024   GFRNONAA >60 08/09/2022   GFRAA 71 03/29/2020   Lipid Panel:  Lab Results  Component Value Date   LDLCALC 64 03/07/2024   HgbA1c:  Lab Results  Component Value Date   HGBA1C 5.6 03/07/2024   Urine Drug Screen:     Component Value Date/Time   LABOPIA NONE DETECTED 06/10/2022 1958   COCAINSCRNUR NONE DETECTED 06/10/2022 1958    LABBENZ NONE DETECTED 06/10/2022 1958   AMPHETMU NONE DETECTED 06/10/2022 1958   THCU NONE DETECTED 06/10/2022 1958   LABBARB NONE DETECTED 06/10/2022 1958    Alcohol Level     Component Value Date/Time   ETH <10 06/11/2022 0024   INR No results found for: INR APTT No results found for: APTT AED levels: No results found for: PHENYTOIN, ZONISAMIDE, LAMOTRIGINE, LEVETIRACETA  CT Head without contrast(Personally reviewed): Posterior R MCA hypodensity concerning for an acute infact.  CT angio Head and Neck with contrast(Personally reviewed): No LVO  MRI Brain(Personally reviewed): Pending  ASSESSMENT   IZAYAH MINER is a 68 y.o. male with permanent afibb on eliquis  and compliant, OSA who presents to the ED after he felt dizzy by his coworkers with acute onset L arm weakness and L hemianopsia.  He is compliant with eliquis  and has not missed any doses.  CT head initially concerning for a posterior R MCA stroke. CTA with no LVO. However, clinically declined in the ED with noted rhythmic eye twitching, poorly responsive and hypertensive to over 200s. Repeat CT Head now concerning for a mass, noted enhancement from contrast given for CTA.   RECOMMENDATIONS  -  MRI Brain with and without contrast - hold eliquis  - can start Aspirin  - MRI Brain with and without contrast - cEEG - Keppra  60mg /Kg IV once. Followed by Keppra  500mg  BID. ______________________________________________________________________  Plan discussed with Dr. Bari with the ED team. Plan discussed with patient at the bedside and his wife over phone.  Signed, Jasai Sorg, MD [facility]

## 2024-07-07 NOTE — Progress Notes (Deleted)
 lt

## 2024-07-07 NOTE — ED Notes (Signed)
Pt transported to CT with this nurse °

## 2024-07-07 NOTE — ED Provider Notes (Signed)
 [place] EMERGENCY DEPARTMENT AT [facility] Provider Note   CSN: [id number] Arrival date & time: 07/06/24  2344  An emergency department physician performed an initial assessment on this suspected stroke patient at 0001.  Patient presents with: Weakness, Near Syncope, and Code Stroke   Wayne Molina is a 68 y.o. male.   HPI     This is a 68 year old male with a history of A-fib who presents as a code stroke.  Code stroke called from triage.  He works at an [employer] at [employer].  He was wheeled to triage by 2 fellow employees.  Last seen normal 1530.  Complaining of left arm weakness and slurred speech.  Patient stated that he felt like his blood pressure was dropping.  He is noted to have a left facial droop and left arm weakness.  Reports compliance with Eliquis .  Level 5 caveat  Prior to Admission medications   Medication Sig Start Date End Date Taking? Authorizing Provider  apixaban  (ELIQUIS ) 5 MG TABS tablet Take 1 tablet (5 mg total) by mouth 2 (two) times daily. 03/27/24   Croitoru, Mihai, MD  atorvastatin  (LIPITOR) 40 MG tablet Take 1 tablet (40 mg total) by mouth daily. 06/29/24   Gomes, Adriana, DO  calcium  carbonate (OSCAL) 1500 (600 Ca) MG TABS tablet Take by mouth 2 (two) times daily with a meal.    [provider]  Cyanocobalamin (VITAMIN B-12 PO) Take 1 capsule by mouth daily.    [provider]  cyclobenzaprine  (FLEXERIL ) 5 MG tablet Take 1 tablet (5 mg total) by mouth 2 (two) times daily as needed for muscle spasms. 06/17/24   Mayer, Jodi R, NP  lidocaine  (LIDODERM ) 5 % Place 1 patch onto the skin daily. Apply to affected area on neck/shoulder and keep in place for 12 hours and remove for 12 hours 06/17/24   Mayer, Jodi R, NP  losartan  (COZAAR ) 100 MG tablet Take 1 tablet (100 mg total) by mouth daily. 05/10/24   Gomes, Adriana, DO  Magnesium Oxide 250 MG TABS Take 1 tablet by mouth daily in the afternoon. 02/07/18   [provider]   Multiple Vitamin (MULTIVITAMIN ADULT PO) Take 1 tablet by mouth daily. 02/07/18   [provider]  Potassium Gluconate 2 MEQ TABS Take 1 tablet by mouth daily in the afternoon. 02/07/18   [provider]  sildenafil  (VIAGRA ) 50 MG tablet Take 2 tablets (100 mg total) by mouth daily as needed. 06/14/23   Jeanelle Layman LITTIE, MD    Allergies: Patient has no known allergies.    Review of Systems  Constitutional:  Negative for fever.  Neurological:  Positive for weakness. Negative for headaches.  All other systems reviewed and are negative.   Updated Vital Signs BP 112/69   Pulse 98   Temp 98.2 F (36.8 C) (Oral)   Resp (!) 21   Ht 1.753 m (5' 9)   Wt 73.9 kg   SpO2 93%   BMI 24.07 kg/m   Physical Exam Vitals and nursing note reviewed.  Constitutional:      Appearance: He is well-developed. He is ill-appearing. He is not toxic-appearing.  HENT:     Head: Normocephalic and atraumatic.     Mouth/Throat:     Mouth: Mucous membranes are moist.  Eyes:     Pupils: Pupils are equal, round, and reactive to light.  Cardiovascular:     Rate and Rhythm: Normal rate and regular rhythm.  Pulmonary:  Effort: Pulmonary effort is normal. No respiratory distress.  Abdominal:     Palpations: Abdomen is soft.     Tenderness: There is no abdominal tenderness.  Musculoskeletal:     Cervical back: Neck supple.  Skin:    General: Skin is warm and dry.  Neurological:     Mental Status: He is alert and oriented to person, place, and time.     Comments: Alert and oriented, left facial droop noted, left-sided neglect, left drift noted left upper extremity  Psychiatric:        Mood and Affect: Mood normal.     (all labs ordered are listed, but only abnormal results are displayed) Labs Reviewed  COMPREHENSIVE METABOLIC PANEL WITH GFR - Abnormal; Notable for the following components:      Result Value   Glucose, Bld 125 (*)    Total Bilirubin 1.7 (*)    All other  components within normal limits  I-STAT CHEM 8, ED - Abnormal; Notable for the following components:   BUN 24 (*)    Glucose, Bld 117 (*)    All other components within normal limits  CBG MONITORING, ED - Abnormal; Notable for the following components:   Glucose-Capillary 121 (*)    All other components within normal limits  CBG MONITORING, ED - Abnormal; Notable for the following components:   Glucose-Capillary 139 (*)    All other components within normal limits  PROTIME-INR  APTT  CBC  DIFFERENTIAL  ETHANOL    EKG: None  Radiology: CT HEAD WO CONTRAST ( ) Result Date: 07/07/2024 CLINICAL DATA:  Initial evaluation for acute mental status change, unknown cause. EXAM: CT HEAD WITHOUT CONTRAST TECHNIQUE: Contiguous axial images were obtained from the base of the skull through the vertex without intravenous contrast. RADIATION DOSE REDUCTION: This exam was performed according to the departmental dose-optimization program which includes automated exposure control, adjustment of the mA and/or kV according to patient size and/or use of iterative reconstruction technique. COMPARISON:  Comparison made with CTs from earlier the same day. FINDINGS: Brain: IV contrast materials on board related to CTA performed earlier the same evening. Now evident is a probable enhancing intra-axial mass involving the posterior right frontotemporal region, in the region of previously suspected right MCA infarct. The mass is enhancing and appears bilobed. Dominant component at the right temporal region measures 2.9 cm (series 5, image 15). Additional dominant component partially extending towards the midline at the splenium measures up to 3.7 cm (series 5, image 18). Surrounding hypodensity therefore likely reflects vasogenic edema. Mild mass effect on the adjacent right lateral ventricle without midline shift. No other visible mass lesion or abnormal enhancement. No acute intracranial hemorrhage. No acute large  vessel territory infarct. No extra-axial fluid collection. Suspected small remote right cerebellar infarct noted. Vascular: IV contrast material seen throughout the intracranial vasculature. Calcified atherosclerosis present at skull base. Skull: Scalp soft tissues and calvarium demonstrate no new finding. Sinuses/Orbits: Globes orbital soft tissues demonstrate no acute finding. Paranasal sinuses and mastoid air cells remain clear. Other: None. IMPRESSION: 1. Now evident is an enhancing intra-axial mass involving the posterior right frontotemporal region, in the region of previously suspected right MCA infarct. The enhancing mass is bilobed, with dominant components measuring 2.9 cm and 3.7 cm at the right temporal lobe and splenium respectively. Surrounding vasogenic edema without midline shift. 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 Further evaluation with dedicated MRI, with and without contrast, recommended. 2. No other acute intracranial abnormality. Case  briefly discussed by telephone at the time of interpretation on 07/07/2024 at 1:35 a.m. to provider [facility]. Electronically Signed   By: Morene Hoard M.D.   On: 07/07/2024 01:46   CT ANGIO HEAD NECK W WO CM (CODE STROKE) Result Date: 07/07/2024 CLINICAL DATA:  Initial evaluation for acute neuro deficit, stroke. EXAM: CT ANGIOGRAPHY HEAD AND NECK WITH AND WITHOUT CONTRAST TECHNIQUE: Multidetector CT imaging of the head and neck was performed using the standard protocol during bolus administration of intravenous contrast. Multiplanar CT image reconstructions and MIPs were obtained to evaluate the vascular anatomy. Carotid stenosis measurements (when applicable) are obtained utilizing NASCET criteria, using the distal internal carotid diameter as the denominator. RADIATION DOSE REDUCTION: This exam was performed according to the departmental dose-optimization program which includes automated exposure control,  adjustment of the mA and/or kV according to patient size and/or use of iterative reconstruction technique. CONTRAST:  75mL OMNIPAQUE  IOHEXOL  350 MG/ML SOLN COMPARISON:  CT from earlier the same day. FINDINGS: CTA NECK FINDINGS Aortic arch: Aortic arch within normal limits for caliber standard branch pattern. Aortic atherosclerosis. No significant stenosis about the origin the great vessels. Right carotid system: No evidence of dissection, stenosis (50% or greater), or occlusion. Left carotid system: Left common and internal carotid arteries are patent without dissection. Mixed plaque about the left carotid bulb with estimated 50% stenosis by NASCET criteria. 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 Distal reconstitution at the V3 segments via muscular collaterals, greater on the right (series 5, image 165, 160). Vertebral arteries are patent as they course into the cranial vault. No visible stenosis or dissection. Skeleton: No worrisome osseous lesions.  Patient is edentulous. Other neck: No other acute finding. Upper chest: No other acute finding. Review of the MIP images confirms the above findings CTA HEAD FINDINGS Anterior circulation: Both internal carotid arteries are patent through the siphons without stenosis. A1 segments patent bilaterally. Right A1 hypoplastic. Normal anterior communicating artery complex. Anterior cerebral arteries patent without stenosis. No M1 stenosis or occlusion. No proximal MCA branch occlusion. Distal MCA branches perfused and fairly symmetric. Posterior circulation: Right V4 segment patent without stenosis. Left V4 segment patent proximally to the takeoff of the left PICA, but largely acute lose distally to the vertebrobasilar junction. Left PICA patent. Right PICA not well seen. Basilar diminutive but patent without stenosis. Superior cerebral arteries patent bilaterally. Left PCA supplied via the  basilar. Right PCA supplied via the basilar as well as a robust right posterior communicating artery. Both PCAs patent to their distal aspects without significant stenosis. Venous sinuses: Grossly patent allowing for timing the contrast bolus. Anatomic variants: As above.  No aneurysm. Review of the MIP images confirms the above findings IMPRESSION: 1. Negative CTA for acute large vessel occlusion or other emergent finding. 2. 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 Distal left V4 segment subsequently reoccludes beyond the takeoff of the left PICA. 3. Atheromatous change about the left carotid bulb with estimated 50% stenosis by NASCET criteria. Aortic Atherosclerosis (ICD10-I70.0). Electronically Signed   By: Morene Hoard M.D.   On: 07/07/2024 00:50   CT HEAD CODE STROKE WO CONTRAST Result Date: 07/07/2024 CLINICAL DATA:  Code stroke. Initial evaluation for acute neuro deficit, stroke. No other relevant history provided. EXAM: CT HEAD WITHOUT CONTRAST TECHNIQUE: Contiguous axial images were obtained from the base of the skull through the vertex  without intravenous contrast. RADIATION DOSE REDUCTION: This exam was performed according to the departmental dose-optimization program which includes automated exposure control, adjustment of the mA and/or kV according to patient size and/or use of iterative reconstruction technique. COMPARISON:  None Available. FINDINGS: Brain: Cerebral volume within normal limits. 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 No associated hemorrhage. No significant mass effect. Gray-white matter differentiation otherwise maintained. No mass lesion or midline shift. No hydrocephalus or extra-axial fluid collection. Vascular: No convincing hyperdense vessel by CT. Scattered vascular calcifications noted within the carotid  siphons. Skull: Scalp soft tissues within normal limits.  Calvarium intact. Sinuses/Orbits: Globes orbital soft tissues within normal limits. Paranasal sinuses and mastoid air cells are largely clear. Other: None. ASPECTS [facility] Stroke Program Early CT Score) - Ganglionic level infarction (caudate, lentiform nuclei, internal capsule, insula, M1-M3 cortex): 6 - Supraganglionic infarction (M4-M6 cortex): 2 Total score (0-10 with 10 being normal): 8 IMPRESSION: 1. 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 No associated hemorrhage or significant mass effect. 2. Aspects is 8. These results were communicated to Dr. Vanessa at 12:18 am on 07/07/2024 by text page via the [facility] messaging system. Electronically Signed   By: Morene Hoard M.D.   On: 07/07/2024 00:19     .Critical Care  Performed by: Bari Charmaine FALCON, MD Authorized by: Bari Charmaine FALCON, MD   Critical care provider statement:    Critical care time (minutes):  65   Critical care was necessary to treat or prevent imminent or life-threatening deterioration of the following conditions:  CNS failure or compromise   Critical care was time spent personally by me on the following activities:  Development of treatment plan with patient or surrogate, discussions with consultants, evaluation of patient's response to treatment, examination of patient, ordering and review of laboratory studies, ordering and review of radiographic studies, ordering and performing treatments and interventions, pulse oximetry, re-evaluation of patient's condition and review of old charts    Medications Ordered in the ED  sodium chloride  flush (NS) 0.9 % injection 3 mL (3 mLs Intravenous Given 07/07/24 0034)  iohexol  (OMNIPAQUE ) 350 MG/ML injection 75 mL (75 mLs Intravenous Contrast Given 07/07/24 0018)  ondansetron  (ZOFRAN ) injection 4 mg (4 mg Intravenous Given 07/07/24 0031)  hydrALAZINE   (APRESOLINE ) injection 20 mg (20 mg Intravenous Given 07/07/24 0132)  labetalol  (NORMODYNE ) injection 20 mg (20 mg Intravenous Given 07/07/24 0138)  levETIRAcetam  (KEPPRA ) undiluted injection 4,500 mg (4,500 mg Intravenous Given 07/07/24 0145)  sodium chloride  0.9 % bolus 1,000 mL (1,000 mLs Intravenous New Bag/Given 07/07/24 0201)    Clinical Course as of 07/07/24 0338  Fri Jul 07, 2024  0119 Was called and the patient's room for increased altered mental status.  Now has nystagmus on exam.  Only oriented to person and place.  Previously was more talkative.  States he feels groggy.  No seizure activity noted by nursing.  Blood pressure 212/93.  Neurology updated.  Repeat stat CT ordered. [CH]  0127 Patient now with bleed?.  Cleviprex  and reversal agent ordered.  Neurology updated.  Spoke with radiology who favors a mass.  Patient appears to be having focal seizure activity with twitching of the left cheek and nystagmus.  Loaded with Keppra .  Neurology at the bedside. [CH]  [phone] Patient remains somnolent but is protecting his airway.  Sleepy from seizure medications.  No longer having twitching or nystagmus.  Was taken to MRI  but reportedly was moving too much to get imaging.  Neurology updated.  EEG to be applied.  Will admit to the hospitalist. [CH]    Clinical Course User Index [CH] Bartosz Luginbill, Charmaine FALCON, MD                                 Medical Decision Making Amount and/or Complexity of Data Reviewed Labs: ordered. Radiology: ordered.  Risk Prescription drug management. Decision regarding hospitalization.   This patient presents to the ED for concern of stroke, this involves an extensive number of treatment options, and is a complaint that carries with it a high risk of complications and morbidity.  I considered the following differential and admission for this acute, potentially life threatening condition.  The differential diagnosis includes stroke, seizure, bleed, mass  MDM:    This  is a 68 year old male who presents with strokelike symptoms.  Was a code stroke from triage.  Is on Eliquis .  Does have some left-sided deficits and neglect.  Initial CT scan was concerning for CVA.  However, patient had worsening altered mental status and additional nystagmus.  Questioned seizure.  Repeat CT shows blush within the prior abnormality which makes mass more likely.  This would fit with left sided focal seizures.  He was loaded with Keppra .  Neurology at the bedside.  Keppra  did make the patient much more sleepy.  He was protecting his airway.  Continuous EEG ordered.  He did have temporary increase in blood pressure related to likely seizure activity.  This improved.  Labs reviewed and otherwise largely reassuring.  Family at the bedside and updated.  Patient will need MRI.  Attempted to get MRI but patient was apparently not cooperative enough.  Will hold until his mental status clears.  (Labs, imaging, consults)  Labs: I Ordered, and personally interpreted labs.  The pertinent results include: Stroke panel  Imaging Studies ordered: I ordered imaging studies including CT, CTA, repeat CT I independently visualized and interpreted imaging. I agree with the radiologist interpretation  Additional history obtained from coworkers, family.  External records from outside source obtained and reviewed including prior evaluations  Cardiac Monitoring: The patient was maintained on a cardiac monitor.  If on the cardiac monitor, I personally viewed and interpreted the cardiac monitored which showed an underlying rhythm of: Atrial fibrillation  Reevaluation: After the interventions noted above, I reevaluated the patient and found that they have :worsened  Social Determinants of Health:  lives independently  Disposition: Admit  Co morbidities that complicate the patient evaluation  Past Medical History:  Diagnosis Date   Anxiety    Arthritis    fingers   Chronic atrial fibrillation  (HCC)    Depression    Family history of breast cancer    Family history of cancer of male genital organ    Family history of gene mutation    BRIP1   Family history of malignant neoplasm of gastrointestinal tract    Hyperlipidemia    Hypertension    Personal history of colonic polyps 11/12/2006   Sleep apnea      Medicines Meds ordered this encounter  Medications   sodium chloride  flush (NS) 0.9 % injection 3 mL   iohexol  (OMNIPAQUE ) 350 MG/ML injection 75 mL   ondansetron  (ZOFRAN ) injection 4 mg   DISCONTD: levETIRAcetam  (KEPPRA ) undiluted injection 1,500 mg   hydrALAZINE  (APRESOLINE ) injection 20 mg   labetalol  (NORMODYNE ) injection 20 mg  DISCONTD: clevidipine  (CLEVIPREX ) infusion 0.5 mg/mL   DISCONTD: coag fact Xa recombinant (ANDEXXA) low dose infusion 900 mg    Is the last apixaban  dose >5mg  or last rivaroxaban dose >10mg ?:   Yes    Is the last apixaban  or rivaroxaban dose taken < 8 hours prior?:   No    Andexxa dose selection::   If NO to EITHER question, proceed with Low Dose Andexxa (900mg )   levETIRAcetam  (KEPPRA ) undiluted injection 4,500 mg   sodium chloride  0.9 % bolus 1,000 mL    I have reviewed the patients home medicines and have made adjustments as needed  Problem List / ED Course: Problem List Items Addressed This Visit   None Visit Diagnoses       Cerebral infarction, unspecified mechanism (HCC)    -  Primary                Final diagnoses:  Cerebral infarction, unspecified mechanism [facility])    ED Discharge Orders     None          Bari Charmaine FALCON, MD 07/07/24 [phone]

## 2024-07-07 NOTE — ED Notes (Signed)
 MRI called to get patient for scan due to him being code stroke

## 2024-07-07 NOTE — Progress Notes (Signed)
   07/07/24 2159  Vitals  Temp 100 F (37.8 C)  Pulse Rate (!) 57  ECG Heart Rate (!) 59  Resp 18  Oxygen Therapy  SpO2 100 %  MEWS Score  MEWS Temp 0  MEWS Systolic 0  MEWS Pulse 0  MEWS RR 0  MEWS LOC 1  MEWS Score 1  MEWS Score Color Green    Propofol  adjusted to prevent bradycardia. Patient is in Afib with HR sustaining above 60.

## 2024-07-07 NOTE — Progress Notes (Signed)
 OT Cancellation Note  Patient Details Name: KAINON VARADY MRN: [id number] DOB: 1955-12-04   Cancelled Treatment:    Reason Eval/Treat Not Completed: Patient not medically ready- pt lethargic due to medication and continued workup for brain mass.  OT will follow and see as able/appropriate.   Etta NOVAK, OT Acute Rehabilitation Services Office [phone] Secure Chat Preferred    Etta GORMAN Hope 07/07/2024, 9:58 AM

## 2024-07-07 NOTE — Plan of Care (Addendum)
 Evaluated patient at bedside with Dr. Alena and CCM. Updated brother, wife, and step-daughter. Patient will be going to the ICU and was intubated for airway protection.

## 2024-07-07 NOTE — ED Notes (Signed)
 Patient transported to MRI

## 2024-07-07 NOTE — Progress Notes (Addendum)
 Subjective: No further seizure-like activity per brother at bedside.  However patient has been agitated requiring lorazepam  4 mg this morning   ROS: Unable to obtain due to poor mental status  Examination  Vital signs in last 24 hours: Temp:  [97.8 F (36.6 C)-98.2 F (36.8 C)] 97.8 F (36.6 C) (08/29 1011) Pulse Rate:  [60-98] 75 (08/29 1100) Resp:  [13-29] 18 (08/29 1100) BP: (111-220)/(64-155) 131/75 (08/29 1100) SpO2:  [80 %-100 %] 100 % (08/29 1100) Weight:  [73.9 kg] 73.9 kg (08/29 0039)  General: lying in bed, NAD Neuro: I examined the patient after he had received lorazepam .  Therefore he did not open eyes to repeated tactile and verbal stimulation, on passive eye opening, pupils were miotic so difficult to appreciate any reactivity, no forced gaze deviation was noted, he did not follow any commands or answer any questions, did have antigravity strength in all 4 extremities   Basic Metabolic Panel: Recent Labs  Lab 07/07/24 0003 07/07/24 0008 07/07/24 0602  NA 137 138 136  K 4.4 4.2 5.1  CL 102 102 103  CO2 25  --  23  GLUCOSE 125* 117* 170*  BUN 21 24* 20  CREATININE 1.02 1.00 1.08  CALCIUM  9.7  --  9.0    CBC: Recent Labs  Lab 07/07/24 0003 07/07/24 0008 07/07/24 0602  WBC 8.2  --  13.4*  NEUTROABS 3.9  --   --   HGB 15.6 15.6 15.5  HCT 46.8 46.0 47.2  MCV 92.9  --  94.2  PLT 250  --  224     Coagulation Studies: Recent Labs    07/07/24 0003  LABPROT 14.8  INR 1.1    Imaging personally reviewed  CT head without contrast 07/07/2024:  Now evident is an enhancing intra-axial mass involving the posterior right frontotemporal region, in the region of previously suspected right MCA infarct. The enhancing mass is bilobed, with dominant components measuring 2.9 cm and 3.7 cm at the right temporal lobe and splenium respectively. Surrounding vasogenic edema without midline shift. 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 Further  evaluation with dedicated MRI,with and without contrast, recommended.  CTA head and neck with and without contrast 07/07/2024: Negative CTA for acute large vessel occlusion or other emergentfinding. 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 Atheromatous change about the left carotid bulb with estimated 50% stenosis by NASCET criteria.  MRI brain with and without contrast 07/07/2024:Positive for relatively large and infiltrative tumor with heterogeneous enhancement tracking from the splenium of the corpus callosum near midline into the right temporal lobe. Confluent regional tumoral edema. No midline shift or significant intracranial mass effect. Top differential considerations are Glioblastoma and CNS Lymphoma. No other acute intracranial abnormality, with underlying small chronic cerebellar infarcts.    ASSESSMENT AND PLAN:  68 y.o. 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  Therefore continue Keppra  500 mg twice daily for now - If patient continues to be agitated, okay to schedule Valium  2.5 mg twice daily as it will help with agitation as well as seizures - Please hold home Eliquis   because of biopsy tomorrow.  Of note this does increase his risk of stroke.  Therefore any acute change in mental status or focal neurological deficit will need stat CT head and CTA head and neck  ADDENDUM - Patient took eliquis  yesterday so will need to postpone biopsy. Will put on non bolus heparin  drip protocol in the interim for A fib. Discussed with Dr Rosemarie, Dr Rosslyn and Dr [facility]   CRITICAL  CARE Performed by: Arlin MALVA Krebs   Total critical care time: 35  minutes  Critical care time was exclusive of separately billable procedures and treating other patients.  Critical care was necessary to treat or prevent imminent or life-threatening deterioration.  Critical care was time spent personally by me on the following activities: development of treatment plan with patient and/or surrogate as well as nursing, discussions with consultants, evaluation of patient's response to treatment, examination of patient, obtaining history from patient or surrogate, ordering and performing treatments and interventions, ordering and review of laboratory studies, ordering and review of radiographic studies, pulse oximetry and re-evaluation of patient's condition.   Arlin Krebs Epilepsy [facility] For questions after 5pm please refer to AMION to reach the Neurologist on call

## 2024-07-07 NOTE — ED Notes (Signed)
 Pt unable to hold still in MRI and pt was sent back. EDP and Neurologist made aware.

## 2024-07-07 NOTE — Assessment & Plan Note (Addendum)
 GCS 8. Likely brain mass vs. stroke. Patient is lethargic at bedside likely 2/2 post-ictal state and Keppra  administration - Admit to [facility] Medicine Teaching Service attending Dr. Donah - Neurology consulted, recs appreciated - Expect Neurosurgery consult pending brain MRI - NPO - Echo - MRI brain w/wo contrast - BMP, CBC, HbA1c, lipid panel, TSH - Aspirin  suppository 150mg  - Holding home Eliquis  5mg  and Losartan  100mg  - s/p loading dose Keppra  4500mg  - Continuous EEG - Continuous cardiac monitoring - Neuro checks q4hrs - PT/OT eval - SLP eval - Fall and seizure precautions - 2mg  IV Ativan  for seizure last greater than 5 min

## 2024-07-07 NOTE — Progress Notes (Signed)
 LTM VIDEO EEG hooked up and running - no initial skin breakdown - push button tested - [facility] is now monitoring.

## 2024-07-07 NOTE — Progress Notes (Signed)
 E-Link called regarding low grade fever. Ice packs applied, ambient temperature adjusted, fan provided.   See new orders.

## 2024-07-07 NOTE — Plan of Care (Deleted)
 Consulted [facility] via phone call with receptionist at 11:30 am today for Mr. Hohn requesting consult re: MRI findings with new AMS/seizure activity. Left callback number ([phone]), resident work room.  Elio Art, MD Family Medicine

## 2024-07-07 NOTE — Progress Notes (Signed)
 PHARMACY - ANTICOAGULATION CONSULT NOTE  Pharmacy Consult for Heparin  Indication: atrial fibrillation  No Known Allergies  Patient Measurements: Height: 5' 9 (175.3 cm) Weight: 73.9 kg (163 lb) IBW/kg (Calculated) : 70.7 HEPARIN  DW (KG): 73.9  Vital Signs: Temp: 99.5 F (37.5 C) (08/29 1600) Temp Source: Axillary (08/29 1011) BP: 106/64 (08/29 1600) Pulse Rate: 65 (08/29 1600)  Labs: Recent Labs    07/07/24 0003 07/07/24 0008 07/07/24 0602  HGB 15.6 15.6 15.5  HCT 46.8 46.0 47.2  PLT 250  --  224  APTT 36  --   --   LABPROT 14.8  --   --   INR 1.1  --   --   CREATININE 1.02 1.00 1.08    Estimated Creatinine Clearance: 63.6 mL/min (by C-G formula based on SCr of 1.08 mg/dL).   Medical History: Past Medical History:  Diagnosis Date   Anxiety    Arthritis    fingers   Chronic atrial fibrillation (HCC)    Depression    Family history of breast cancer    Family history of cancer of male genital organ    Family history of gene mutation    BRIP1   Family history of malignant neoplasm of gastrointestinal tract    Hyperlipidemia    Hypertension    Personal history of colonic polyps 11/12/2006   Sleep apnea     Medications:  Medications Prior to Admission  Medication Sig Dispense Refill Last Dose/Taking   apixaban  (ELIQUIS ) 5 MG TABS tablet Take 1 tablet (5 mg total) by mouth 2 (two) times daily. 180 tablet 1 07/06/2024 at  8:30 AM   atorvastatin  (LIPITOR) 40 MG tablet Take 1 tablet (40 mg total) by mouth daily. 90 tablet 1 07/06/2024 Morning   calcium  carbonate (OSCAL) 1500 (600 Ca) MG TABS tablet Take by mouth 2 (two) times daily with a meal.   07/06/2024 Morning   Cyanocobalamin (VITAMIN B-12 PO) Take 1 capsule by mouth daily.   07/06/2024 Morning   losartan  (COZAAR ) 100 MG tablet Take 1 tablet (100 mg total) by mouth daily. 90 tablet 3 07/06/2024 Morning   Magnesium Oxide 250 MG TABS Take 1 tablet by mouth daily in the afternoon.   07/06/2024 Morning   Multiple  Vitamin (MULTIVITAMIN ADULT PO) Take 1 tablet by mouth daily.   07/06/2024 Morning   Potassium Gluconate 2 MEQ TABS Take 1 tablet by mouth daily in the afternoon.   07/06/2024 Morning   sildenafil  (VIAGRA ) 50 MG tablet Take 2 tablets (100 mg total) by mouth daily as needed. 10 tablet 2 Unknown   cyclobenzaprine  (FLEXERIL ) 5 MG tablet Take 1 tablet (5 mg total) by mouth 2 (two) times daily as needed for muscle spasms. (Patient not taking: Reported on 07/07/2024) 10 tablet 0 Not Taking   lidocaine  (LIDODERM ) 5 % Place 1 patch onto the skin daily. Apply to affected area on neck/shoulder and keep in place for 12 hours and remove for 12 hours (Patient not taking: Reported on 07/07/2024) 10 patch 0 Not Taking    Assessment: 68 y.o. M on Eliquis  pta for afib. Holding for procedures - brain biopsy of tumor in next 1-2 days when stable. Last dose 8/28 0830. To begin heparin . Eliquis  will be affecting heparin  levels so will utilize aPTT for monitoring until levels correlate. CBC stable.  Goal of Therapy:  Heparin  level 0.3-0.7 units/ml aPTT 66-102 seconds Monitor platelets by anticoagulation protocol: Yes   Plan:  Heparin  gtt at 1100 units/hr. No bolus. Will f/u  heparin  level and aPTT in 8 hours Daily heparin  level, aPTT, and CBC  Vito Ralph, PharmD, BCPS Please see amion for complete clinical pharmacist phone list 07/07/2024,4:53 PM

## 2024-07-07 NOTE — ED Notes (Signed)
 Pt transported to MRI with this nurse.

## 2024-07-07 NOTE — ED Notes (Signed)
 MRI called stating patient was agitated, and removing equipment. PRN dose ativan  taken and given to patient to complete scans.

## 2024-07-07 NOTE — Assessment & Plan Note (Signed)
 GCS 8. Likely brain mass vs. stroke. Patient is lethargic at bedside likely 2/2 post-ictal state and Keppra  administration - Admit to [facility] Medicine Teaching Service attending Dr. Donah - Neurology consulted, recs appreciated - Expect Neurosurgery consult pending brain MRI - NPO - Echo - MRI brain w/wo contrast - BMP, CBC, HbA1c, lipid panel, TSH - Aspirin  suppository 150mg  - Holding home Eliquis  5mg  and Losartan  100mg  - s/p loading dose Keppra  4500mg  - Continuous EEG - Continuous cardiac monitoring - Neuro checks q4hrs - PT/OT eval - SLP eval - Fall and seizure precautions - 2mg  IV Ativan  for seizure last greater than 5 min

## 2024-07-07 NOTE — ED Notes (Signed)
 Pt started to become lethargic and only oriented to person. EDP made aware and Neurologist made aware.

## 2024-07-07 NOTE — Progress Notes (Signed)
   07/07/24 2237  Level of Consciousness  Level of Consciousness Responds to Voice  Pre-WUA / WUA Start  [place] Agitation Sedation Scale (RASS) 2  RASS Goal -2     RASS +2, Prn Fentanyl  administered.

## 2024-07-07 NOTE — Progress Notes (Signed)
 Pt transported from ED18 to 4N21 by ED Paramedic and RT w/o complications

## 2024-07-07 NOTE — Plan of Care (Addendum)
 FMTS Brief Progress Note  S:Patient unable to answer questions    O: BP (!) 144/78   Pulse 68   Temp 98 F (36.7 C) (Oral)   Resp 16   Ht 5' 9 (1.753 m)   Wt 73.9 kg   SpO2 100%   BMI 24.07 kg/m    Physical exam:  Neuro:  GCS 7, no eye opening, incomprehensible sounds, flexion with sternal rub  2-3 beats of clonus bilaterally. Pupils constrict to light. Intermittent rigidity of the bilateral UE. No lower extremity rigidity during exam. Intermittent signifanct msucle spasms of the bilateral calves. Intermittent right periorbital fasciculations, intermittent left biceps fasciculations.  CV: RRR Pulm: no increased WOB, on room air Ebd: soft, questionably tender as patient recoiled with pressure     Latest Ref Rng & Units 07/07/2024    6:02 AM 07/07/2024   12:08 AM 07/07/2024   12:03 AM  BMP  Glucose 70 - 99 mg/dL 829  882  874   BUN 8 - 23 mg/dL 20  24  21    Creatinine 0.61 - 1.24 mg/dL 8.91  8.99  8.97   Sodium 135 - 145 mmol/L 136  138  137   Potassium 3.5 - 5.1 mmol/L 5.1  4.2  4.4   Chloride 98 - 111 mmol/L 103  102  102   CO2 22 - 32 mmol/L 23   25   Calcium  8.9 - 10.3 mg/dL 9.0   9.7    CBC    Component Value Date/Time   WBC 13.4 (H) 07/07/2024 0602   RBC 5.01 07/07/2024 0602   HGB 15.5 07/07/2024 0602   HCT 47.2 07/07/2024 0602   PLT 224 07/07/2024 0602   MCV 94.2 07/07/2024 0602   MCH 30.9 07/07/2024 0602   MCHC 32.8 07/07/2024 0602   RDW 12.6 07/07/2024 0602   LYMPHSABS 3.0 07/07/2024 0003   MONOABS 0.9 07/07/2024 0003   EOSABS 0.2 07/07/2024 0003   BASOSABS 0.1 07/07/2024 0003   Lipid Panel     Component Value Date/Time   CHOL 114 07/07/2024 0602   CHOL 132 03/07/2024 1018   TRIG 40 07/07/2024 0602   HDL 44 07/07/2024 0602   HDL 41 03/07/2024 1018   CHOLHDL 2.6 07/07/2024 0602   VLDL 8 07/07/2024 0602   LDLCALC 62 07/07/2024 0602   LDLCALC 64 03/07/2024 1018   LABVLDL 27 03/07/2024 1018   TSH wnl at 1.615  CT head 07/07/24 at 0126 Rads  report: 1. Now evident is an enhancing intra-axial mass involving the posterior right frontotemporal region, in the region of previously suspected right MCA infarct. The enhancing mass is bilobed, with dominant components measuring 2.9 cm and 3.7 cm at the right temporal lobe and splenium respectively. Surrounding vasogenic edema without midline shift. 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 Further evaluation with dedicated MRI, with and without contrast, recommended. 2. No other acute intracranial abnormality.  MRI brain 07/07/24 Rads impression 1. Positive for relatively large and infiltrative tumor with heterogeneous enhancement tracking from the splenium of the corpus callosum near midline into the right temporal lobe. Confluent regional tumoral edema. No midline shift or significant intracranial mass effect. Top differential considerations are Glioblastoma and CNS Lymphoma.   2. No other acute intracranial abnormality, with underlying small chronic cerebellar infarcts.  A/P: AMS  Concern for seizures in setting of stroke vs brain lesion - Prior concern for MCA stroke, with repeat CT concerning for brain lesion with mass effect  -  MRI now resulted with concern for GBM vs CNS lymphoma - Appeared to be having seizures, and received loading dose Keppra  - will order keppra  500 mg BID per neuro recs - neurology following, appreciate recs - The following have been ordered and I am awaiting completion/report: video cEEG, HIV, A1C - will stop aspirin  suppositories at this time - permissive HTN, holding home losartan  - holding home eliquis  (for afib) - telemetry, h/o afib with concern for stroke - awaiting PT/OT/SLP recs, but ordered - no PRN ativan  at this time, will have nursing call for orders - consult neurosurgery given MRI findings - consider consulting palliative care after neurosurgery weighs in - consult ICU for floor recommendation given concern  for clinical deterioration   Alena Morrison, Elio, MD 07/07/2024, 6:57 AM PGY-1, [facility] Night Resident  Please page [phone] with questions.

## 2024-07-07 NOTE — Consult Note (Signed)
 NAME:  Wayne Molina, MRN:  [id number], DOB:  1956-05-06, LOS: 0 ADMISSION DATE:  07/06/2024, CONSULTATION DATE:  07/07/24 REFERRING MD:  FPTS, CHIEF COMPLAINT:  AMS   History of Present Illness:  HPI obtained from EMR review and per wife at bedside as pt remains encephalopathic.   71 yoM with PMH as below significant for afib on Eliquis , HTN, HLD, and untreated OSA who presented to ER as code stroke after coworker noticed he was altered since 8/28 around 2230 and pale, pt reported feeling dizzy.  Wife reports pt has been in his normal state of health, just recently not sleeping well for the last week and fatigued.  Non smoker, wife denies ETOH use.  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  In ER, noted to have L sided weakness and left hemianopsia.  Last dose eliquis  reported 8/28.  Code stroke activated.  CTH initially concerning for right posterior MCA stroke but CTA negative for LVO.  Also noted to have rhythmic eye twitching, SBP 200s, and more lethargic, oriented to person only.  Repeat CTH concerning for mass.  EEG started, and loaded with keppra .  FPTS to admit.  Given total of 4mg  ativan  for MRI, seizure concern, and intermittent agitation.  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  NSGY consulted, plans for biopsy 8/30.  PCCM consulted for ongoing encephalopathy and concern for airway protection, remains on 2L, no reported hypoxia.   Pertinent  Medical History   Past Medical History:  Diagnosis Date   Anxiety    Arthritis    fingers   Chronic atrial fibrillation (HCC)    Depression    Family history of breast cancer    Family history of cancer of male genital organ    Family history of gene mutation    BRIP1   Family history of  malignant neoplasm of gastrointestinal tract    Hyperlipidemia    Hypertension    Personal history of colonic polyps 11/12/2006   Sleep apnea     Significant Hospital Events: Including procedures, antibiotic start and stop dates in addition to other pertinent events   8/28 admitted   Interim History / Subjective:   Objective    Blood pressure 136/75, pulse 79, temperature 97.8 F (36.6 C), temperature source Axillary, resp. rate 15, height 5' 9 (1.753 m), weight 73.9 kg, SpO2 100%.       No intake or output data in the 24 hours ending 07/07/24 1306 Filed Weights   07/07/24 0039  Weight: 73.9 kg    Examination: General:  ill appearing older male lying in bed HEENT: MM pink/moist, pupils 4/r, anicteric  Neuro: responds to noxious stimuli, wakes up agitated, MAE, seems weaker on left side but difficult to assess, does not f/c or open eyes spont CV:  irir, afib, no murmur PULM:  intermittently obstructed airway resolved with manual jaw thrust, clear lungs GI: soft, bs+ Extremities: warm/dry, no LE edema  Skin: no rashes   Afebrile Labs> WBC 13.4, K 5.1, sCr 1.08, t. Bili 1.7, glucose 170, normal TSH  Resolved problem list   Assessment and Plan   Brain mass Encephalopathy R/o seizures  P:  - Appreciate Neurology and NSGY input - plans for tentative biopsy 8/30 with Dr. Rosslyn 8/30, NPO p midnight - ongoing LTM> no seizure  activity thus far, showing LPDs - AEDs per Neurology > keppra   - PAD protocol below, +prn versed   - maintain neuro protective measures; goal for eurothermia, euglycemia, eunatermia, normoxia, and PCO2 goal of 35-40 - foley   Acute airway insufficiency related to above and s/p ativan   - s/p intubation for airway protection - cont full LTVV support, 4-8cc/kg IBW with goal Pplat <30 and DP<15  - VAP prevention protocol/ PPI - PAD protocol for sedation> propofol , prn fentanyl  with scheduled bowel regimen - CXR/ ABG - wean FiO2 as able for SpO2 >92%   - daily SAT & SBT when appropriate   Afib on eliquis  - last dose 8/28 P:  - tele monitoring, remains rate controlled - optimize electrolytes - cont to hold eliquis   HTN HLD - hold home losartan  for now with sedation - goal MAP > 65   Hyperglycemia - previous A1c normal in May, like reactive - pending A1c - CBG q4, prn SSI if > 180   Wife, [name] [phone], updated at bedside and plan of care.  Confirmed full code status.    Labs   CBC: Recent Labs  Lab 07/07/24 0003 07/07/24 0008 07/07/24 0602  WBC 8.2  --  13.4*  NEUTROABS 3.9  --   --   HGB 15.6 15.6 15.5  HCT 46.8 46.0 47.2  MCV 92.9  --  94.2  PLT 250  --  224    Basic Metabolic Panel: Recent Labs  Lab 07/07/24 0003 07/07/24 0008 07/07/24 0602  NA 137 138 136  K 4.4 4.2 5.1  CL 102 102 103  CO2 25  --  23  GLUCOSE 125* 117* 170*  BUN 21 24* 20  CREATININE 1.02 1.00 1.08  CALCIUM  9.7  --  9.0   GFR: Estimated Creatinine Clearance: 63.6 mL/min (by C-G formula based on SCr of 1.08 mg/dL). Recent Labs  Lab 07/07/24 0003 07/07/24 0602  WBC 8.2 13.4*    Liver Function Tests: Recent Labs  Lab 07/07/24 0003  AST 32  ALT 24  ALKPHOS 53  BILITOT 1.7*  PROT 7.3  ALBUMIN 4.1   No results for input(s): LIPASE, AMYLASE in the last 168 hours. No results for input(s): AMMONIA in the last 168 hours.  ABG    Component Value Date/Time   TCO2 25 07/07/2024 0008     Coagulation Profile: Recent Labs  Lab 07/07/24 0003  INR 1.1    Cardiac Enzymes: No results for input(s): CKTOTAL, CKMB, CKMBINDEX, TROPONINI in the last 168 hours.  HbA1C: Hemoglobin A1C  Date/Time Value Ref Range Status  03/07/2024 09:15 AM 5.6 4.0 - 5.6 % Final    CBG: Recent Labs  Lab 07/07/24 0030 07/07/24 0116  GLUCAP 121* 139*    Review of Systems:   Unable   Past Medical History:  He,  has a past medical history of Anxiety, Arthritis, Chronic atrial fibrillation (HCC), Depression,  Family history of breast cancer, Family history of cancer of male genital organ, Family history of gene mutation, Family history of malignant neoplasm of gastrointestinal tract, Hyperlipidemia, Hypertension, Personal history of colonic polyps (11/12/2006), and Sleep apnea.   Surgical History:   Past Surgical History:  Procedure Laterality Date   2-D echocardiogram  07/22/2010   Ejection fraction greater than 55%. Left atrium moderately dilated. Right atrium moderately dilated. 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 Mild to Moderate MR. Mild to moderate TR.   ATRIAL ABLATION SURGERY  2004   EP IMPLANTABLE DEVICE N/A 02/11/2016   Procedure: Loop  Recorder Insertion;  Surgeon: Jerel Balding, MD;  Location: [facility] INVASIVE CV LAB;  Service: Cardiovascular;  Laterality: N/A;   Exercise Myoview stress test  07/08/2000   Nonischemic low-risk.   HIP FRACTURE SURGERY  1970s   IR GENERIC HISTORICAL  01/21/2017   IR RADIOLOGIST EVAL & MGMT 01/21/2017 [facility]INTER[facility] RAD     Social History:   reports that he has quit smoking. His smoking use included cigarettes. He has never used smokeless tobacco. He reports that he does not drink alcohol and does not use drugs.   Family History:  His family history includes Breast cancer (age of onset: 37) in his niece; Cancer in his paternal aunt and paternal great-grandmother; Cancer (age of onset: 53) in his cousin; Cancer (age of onset: 64) in his niece; Colon cancer in his maternal uncle; Colon cancer (age of onset: 58) in his nephew; Dementia in his mother; Diabetes in his father; Heart disease in his father; Kidney disease in his brother; Lung cancer in his paternal aunt; Other in his niece and sister.   Allergies No Known Allergies   Home Medications  Prior to Admission medications   Medication Sig Start Date End Date Taking? Authorizing Provider  apixaban  (ELIQUIS ) 5 MG TABS tablet Take 1 tablet (5 mg total) by mouth 2 (two) times daily. 03/27/24  Yes Croitoru, Mihai, MD   atorvastatin  (LIPITOR) 40 MG tablet Take 1 tablet (40 mg total) by mouth daily. 06/29/24  Yes Janna Ferrier, DO  calcium  carbonate (OSCAL) 1500 (600 Ca) MG TABS tablet Take by mouth 2 (two) times daily with a meal.   Yes [provider]  Cyanocobalamin (VITAMIN B-12 PO) Take 1 capsule by mouth daily.   Yes [provider]  losartan  (COZAAR ) 100 MG tablet Take 1 tablet (100 mg total) by mouth daily. 05/10/24  Yes Gomes, Adriana, DO  Magnesium Oxide 250 MG TABS Take 1 tablet by mouth daily in the afternoon. 02/07/18  Yes [provider]  Multiple Vitamin (MULTIVITAMIN ADULT PO) Take 1 tablet by mouth daily. 02/07/18  Yes [provider]  Potassium Gluconate 2 MEQ TABS Take 1 tablet by mouth daily in the afternoon. 02/07/18  Yes [provider]  sildenafil  (VIAGRA ) 50 MG tablet Take 2 tablets (100 mg total) by mouth daily as needed. 06/14/23  Yes Jeanelle Layman CROME, MD  cyclobenzaprine  (FLEXERIL ) 5 MG tablet Take 1 tablet (5 mg total) by mouth 2 (two) times daily as needed for muscle spasms. Patient not taking: Reported on 07/07/2024 06/17/24   Mayer, Jodi R, NP  lidocaine  (LIDODERM ) 5 % Place 1 patch onto the skin daily. Apply to affected area on neck/shoulder and keep in place for 12 hours and remove for 12 hours Patient not taking: Reported on 07/07/2024 06/17/24   Loreda Myla SAUNDERS, NP     Critical care time: 55 mins      Lyle Pesa, MSN, AG-ACNP-BC  Pulmonary & Critical Care 07/07/2024, 1:06 PM  See Amion for pager If no response to pager , please call [phone] until 7pm After 7:00 pm call Elink  [phone]

## 2024-07-07 NOTE — Progress Notes (Signed)
 Called E-Link regarding HR variability.

## 2024-07-07 NOTE — Plan of Care (Signed)
  Problem: Activity: Goal: Ability to tolerate increased activity will improve Outcome: Progressing   Problem: Clinical Measurements: Goal: Respiratory complications will improve Outcome: Progressing Goal: Cardiovascular complication will be avoided Outcome: Progressing   Problem: Activity: Goal: Risk for activity intolerance will decrease Outcome: Progressing   Problem: Coping: Goal: Level of anxiety will decrease Outcome: Progressing   Problem: Elimination: Goal: Will not experience complications related to urinary retention Outcome: Progressing   Problem: Pain Managment: Goal: General experience of comfort will improve and/or be controlled Outcome: Progressing   Problem: Safety: Goal: Ability to remain free from injury will improve Outcome: Progressing

## 2024-07-07 NOTE — Plan of Care (Signed)
 Dr. Janjua with nsgy plans for biopsy tomorrow 8/30

## 2024-07-07 NOTE — Progress Notes (Signed)
   07/07/24 2318  Vitals  Temp 100.2 F (37.9 C)  Temp Source Bladder  BP 129/68  MAP (mmHg) 87  BP Location Right Arm  BP Method Automatic  Patient Position (if appropriate) Lying  Pulse Rate 63  Pulse Rate Source Monitor  ECG Heart Rate 61  Resp 18  Oxygen Therapy  SpO2 100 %  MEWS Score  MEWS Temp 0  MEWS Systolic 0  MEWS Pulse 0  MEWS RR 0  MEWS LOC 1  MEWS Score 1  MEWS Score Color Green   Patient was noted to have rhythmic like jerking of upper extremities/head with visible fasciculation of the chest. Episode lasted less than a minute.   Propofol  titrated to meet sedation goal.

## 2024-07-07 NOTE — ED Notes (Signed)
 While providers were bedside, verbal order given for 2 mg ativan  due to seizure like activity.

## 2024-07-07 NOTE — ED Notes (Signed)
 Patient's O2 sat dropped to around 90, likely from meds given. Placed onto 2 lpm

## 2024-07-07 NOTE — Procedures (Signed)
 Intubation Procedure Note  AYDEN HARDWICK  [id number]  10/27/1956  Date:07/07/24  Time:1:07 PM   Provider Performing:Brooke Antonetta    Procedure: Intubation (31500)  Indication(s) Respiratory insufficiency   Consent Risks of the procedure as well as the alternatives and risks of each were explained to the patient and/or caregiver.  Consent for the procedure was obtained and is signed in the bedside chart   Anesthesia Etomidate , Fentanyl , and Propofol    Time Out Verified patient identification, verified procedure, site/side was marked, verified correct patient position, special equipment/implants available, medications/allergies/relevant history reviewed, required imaging and test results available.   Sterile Technique Usual hand hygeine, masks, and gloves were used   Procedure Description Patient positioned in bed supine.  Sedation given as noted above.  Patient was intubated with endotracheal tube using Glidescope.  View was Grade 1 full glottis .  Number of attempts was 1.  Colorimetric CO2 detector was consistent with tracheal placement.  7.5 placed to 24 at lip   Complications/Tolerance None; patient tolerated the procedure well. Chest X-ray is ordered to verify placement.   EBL N/a   Specimen(s) None      Lyle Antonetta, MSN, AG-ACNP-BC [place] [facility] 07/07/2024, 1:09 PM  See Amion for pager If no response to pager , please call [phone] until 7pm After 7:00 pm call Elink  [phone]

## 2024-07-07 NOTE — Code Documentation (Signed)
 Responded to Code Stroke called on pt who arrived to the ED at 2344 for weakness. Code Stroke called in triage at 0001 after noticing L sided weakness and slurred speech, LSN-2230. CBG-117, NIH-5 for L hemianopia, L arm weakness, L sensory deficit, and slurred speech. CT head- Evolving acute to early subacute right MCA distribution infarct involving the posterior right frontotemporal region. No hemorrhage. CTA-no LVO. TNK not given-pt on eliquis . Plan admission for stroke workup. Please complete VS/neuro checks(including NIH) q2h x 12h, then q4h.

## 2024-07-07 NOTE — Hospital Course (Addendum)
 Wayne Molina is a 68 y.o.male with a history of atrial fibrillation, HTN who was admitted to the [facility] Medicine Teaching Service at [facility] for left sided weakness and slurred speech found to be 2/2 glioblastoma. 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 Initial CT Head and CT Angio Head/Neck concerning for evolving MCA stroke. Neurology consulted. Initiated stroke work-up.   While in ED patient had a change in mental status with signs of focal seizure (nystagmus, periorbital twitching, extremity stiffening). Patient loaded with Keppra  and received Ativan . EEG was positive for epileptogenicity arising from right hemisphere (likely 2/2 mass). Keppra  dosing titrated to 1250 mg BID. Later weaned off Versed  drip without recurrence of seizure activity.  Repeat CT Head post-initial seizure showed concern for new brain mass rather than stroke. MRI Brain showed large and infiltrative tumor tracking from splenium of corpus callosum into right temporal lobe. Due to worsening mental status and worsening respiratory depression after Ativan , he was intubated on 8/29 and sent to ICU. Neurosurgery held off on biopsy of the lesion until after extubation on 9/4. 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 CT head 9/17 with reduction in post-biopsy bleeding. Oncology, palliative consulted.  Started on IV Dexamethasone  6mg  q6h on 9/8; transitioned to 4mg  q6h on 9/9, then to 4mg  q8h on 9/10, then to 2mg  q8h on 9/17. Switched to po 9/23.  Family and goals of care meeting held on 9/23 with decision made to move forward with CIR referral, discontinue Cortrak feeding tube with continued encouraged PO intake; code status also re-confirmed at this time. Neurology signed off followed.  Malnutrition of moderate degree Had poor po intake during admission. RD consulted and helped adjust nutrition  regimen. Received feeds via smallbore Cortrak feeding tube, placed 8/31; po intake later improved and Cortrak removed on 9/24. Patient strongly against gastric tube. PO intake improved throughout hospitalization.  Venous Thrombosis US  on 9/14 suggestive of acute intramuscular thrombosis involving the right gastrocnemius vein. Following discussion with family, patient was started on anticoagulation with IV heparin  in 9/15. Following CT head on 9/17 to assess for increased bleed post-biopsy, and with okay from neurology and discussion with patient and family, he was switched to po Eliquis  on 9/18. No issues with bleeding following.  Wound of gluteal cleft Seen on 9/14 and with satellite lesions suggestive of fungal etiology started on clotrimazole  cream BID. Also receiving wound care. Improved throughout hospitalization.  Acute hypoxic respiratory failure due to pneumonia, resolved Due to MSSA and haemophilus influenza pneumonia on sputum culture while admitted for above concerns. Experienced hypoxia requiring intubation on 8/29; extubated on 9/4. Received Unasyn  9/1-9/3. He was placed on Ancef  (9/4-9/9), later switched to Zosyn  (9/12-9/16).  Atrial fibrillation on Eliquis  Overall rate controlled during admission. Heparin  was held the night before biopsy and was held postprocedure. He was bridged with aspirin , later discontinued. Anticoagulation course then as above.  Other chronic conditions were medically managed with home medications and formulary alternatives as necessary (hypertension, hyperglycemia, hyperlipidemia)  PCP Follow-up Recommendations: Neurology follow up in 4-6 weeks. Oncology follow up outpatient

## 2024-07-07 NOTE — Progress Notes (Incomplete)
.  LTM 

## 2024-07-07 NOTE — H&P (Addendum)
 Hospital Admission History and Physical Service Pager: [phone]  Patient name: Wayne Molina Medical record number: [id number] Date of Birth: [birth year] Age: 68 y.o. Gender: male  Primary Care Provider: Janna Ferrier, DO Consultants: Neurology, neurosurgery Code Status: Full which was confirmed with family given patient unable to confirm  Preferred Emergency Contact: Beryle Zeitz (spouse) [phone]  Chief Complaint: Weakness  Differential and Medical Decision Making:  Wayne Molina is a 68 y.o. male with PMHx HTN, HLD, Afib on Eliquis , OSA, and osteoporosis presenting with left sided neuro deficits likely due to brain mass.   Differential for this patient's presentation of this includes stroke vs. mass. Initial presentation and CT head certainly concerning for stroke and cannot be fully ruled out at this time. Repeat CT with mass in region of previously suspected stroke making this the more likely cause of focal neuro deficits. ED course complicated by seizure episode likely secondary to brain lesion with resulting post-ictal state and sedation. Assessment & Plan New brain mass 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 Concern for Stroke (HCC) GCS 8. Likely brain mass vs. stroke. Patient is lethargic at bedside likely 2/2 post-ictal state and Keppra  administration - Admit to [facility] Medicine Teaching Service attending Dr. Donah - Neurology consulted, recs appreciated - Expect Neurosurgery consult pending brain MRI - NPO - Echo - MRI brain w/wo contrast - BMP, CBC, HbA1c, lipid panel, TSH - Aspirin  suppository 150mg  - Holding home Eliquis  5mg  and Losartan  100mg  - s/p loading dose Keppra  4500mg  - Continuous EEG - Continuous cardiac monitoring - Neuro checks q4hrs - PT/OT eval - SLP eval - Fall and seizure precautions - 2mg  IV Ativan  for seizure last greater than 5 min Chronic health problem HTN - holding Losartan  100mg  daily, permissive HTN HLD - continue home  Atorvastatin  40mg  daily Afib - holding home Eliquis  5mg  BID   FEN/GI: NPO VTE Prophylaxis: SCDs  Disposition: Progressive  History of Present Illness:  Wayne Molina is a 68 y.o. male with PMHx HTN, HLD, Afib on Eliquis , OSA, and osteoporosis presenting with weakness.   On exam, patient is lethargic and not responding to pain. Hx provided by family (wife, brother) at bedside. 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 Co-worker's noticed something was wrong and brought him here to the ED.  In the ED, patient presented with L arm weakness and slurred speech for which code stroke was called. Patient noted to have L facial droop and L arm weakness on exam. Initial CT head showed R MCA stroke with repeat CT more consistent with mass. Patient also had a focal seizure while in the ED and is now s/p Keppra  and on continuous EEG. Brain MRI was attempted but could not complete due to patient moving.  Review Of Systems: Per HPI with the following additions: none  Pertinent Past Medical History: HTN HLD Afib OSA Osteoporosis  Remainder reviewed in history tab.   Pertinent Past Surgical History: Hip fracture Foot fracture ICD Atrial ablation   Remainder reviewed in history tab.   Pertinent Social History: Tobacco use: None Alcohol use: None Other Substance use: None Lives at home with wife  Pertinent Family History: Mother - dementia, stroke Father - heart disease, diabetes   Important Outpatient Medications: Eliquis  5mg  BID Atorvastatin  40mg  daily Flexeril  5mg  BID prn Lidocaine  5% patch daily Losartan  100mg  daily Magnesium Oxide 250mg  daily Potassium gluconate 2meq daily  Objective: BP (!) 127/92   Pulse 68   Temp 98.2 F (36.8  C) (Oral)   Resp (!) 25   Ht 5' 9 (1.753 m)   Wt 73.9 kg   SpO2 96%   BMI 24.07 kg/m   Exam: General: NAD, lethargic, ill appearing Cardiovascular: Irregularly irregular pulse, no murmurs Pulmonary: Normal work of breathing  on 2L [place]. CTAB with no wheezes or crackles present Abdomen: Soft, non-tender, non-distended. Extremities: Warm and well-perfused, spontaneously moving extremities Neurologic: Left eye with right drift, pinpoint pupils equal and sluggishly reactive to light, awakens to sternal rub, eyes open to pain, incomprehensible speech on awakening Skin: No rashes or lesions  Labs:  CBC BMET  Recent Labs  Lab 07/07/24 0003 07/07/24 0008  WBC 8.2  --   HGB 15.6 15.6  HCT 46.8 46.0  PLT 250  --    Recent Labs  Lab 07/07/24 0003 07/07/24 0008  NA 137 138  K 4.4 4.2  CL 102 102  CO2 25  --   BUN 21 24*  CREATININE 1.02 1.00  GLUCOSE 125* 117*  CALCIUM  9.7  --      Pertinent additional labs: Tbili 1.7  Imaging Studies Performed: CT head (initial) 1. 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 No associated hemorrhage or significant mass effect. 2. Aspects is 8.  CT Head (repeat) 1. Now evident is an enhancing intra-axial mass involving the posterior right frontotemporal region, in the region of previously suspected right MCA infarct. The enhancing mass is bilobed, with dominant components measuring 2.9 cm and 3.7 cm at the right temporal lobe and splenium respectively. Surrounding vasogenic edema without midline shift. 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 Further evaluation with dedicated MRI, with and without contrast, recommended. 2. No other acute intracranial abnormality  CTA head/neck 1. Negative CTA for acute large vessel occlusion or other emergent finding. 2. 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 Distal left V4 segment subsequently reoccludes beyond the takeoff of the left PICA. 3. Atheromatous change about the left carotid bulb with estimated 50% stenosis by NASCET  criteria.  Jerrie Gathers, DO 07/07/2024, 4:11 AM PGY-1, [facility] Medicine  FPTS Intern pager: [phone], text pages welcome Secure chat group [facility] Teaching Service   I have verified that the resident's  findings are accurately documented in the resident's note. I have made edits and changes where appropriate, and agree with plan.  Ozell Provencal, MD, PGY-3 [facility] Family Medicine 6:19 AM 07/07/2024

## 2024-07-07 NOTE — Progress Notes (Signed)
 PT Cancellation Note  Patient Details Name: Wayne Molina MRN: [id number] DOB: Dec 12, 1955   Cancelled Treatment:    Reason Eval/Treat Not Completed: Fatigue/lethargy limiting ability to participate;Patient not medically ready. Per chart, concern for seizure-like activity this AM. Pt lethargic due to medication and continued workup for brain mass. Will plan to follow-up as able once medically appropriate.   Theo Ferretti, PT, DPT Acute Rehabilitation Services  Office: [phone]    Theo CHRISTELLA Ferretti 07/07/2024, 11:27 AM

## 2024-07-07 NOTE — Progress Notes (Signed)
 SLP Cancellation Note  Patient Details Name: Wayne Molina MRN: [id number] DOB: 1956/05/14   Cancelled treatment:        Attempted to see pt for swallowing assessment.  Spoke with RN.  Pt lethargic 2/2 sedative medication (ativan  and keppra  recently administered) and unable to participate in PO trials at this time.  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  SLP will follow for readiness for swallow evaluation   Anette FORBES Grippe, MA, CCC-SLP Acute Rehabilitation Services Office: [phone] 07/07/2024, 9:53 AM

## 2024-07-07 NOTE — Progress Notes (Signed)
 Echocardiogram 2D Echocardiogram has been performed.  Tinnie FORBES Gosling RDCS 07/07/2024, 3:43 PM

## 2024-07-08 ENCOUNTER — Inpatient Hospital Stay (HOSPITAL_COMMUNITY)

## 2024-07-08 ENCOUNTER — Encounter (HOSPITAL_COMMUNITY)
Admission: EM | Disposition: A | Discharge status: Rehabilitation Facility | Payer: Self-pay | Source: Home / Self Care | Attending: Family Medicine

## 2024-07-08 DIAGNOSIS — G9389 Other specified disorders of brain: Secondary | ICD-10-CM | POA: Diagnosis not present

## 2024-07-08 DIAGNOSIS — R739 Hyperglycemia, unspecified: Secondary | ICD-10-CM | POA: Diagnosis not present

## 2024-07-08 DIAGNOSIS — D497 Neoplasm of unspecified behavior of endocrine glands and other parts of nervous system: Secondary | ICD-10-CM | POA: Diagnosis not present

## 2024-07-08 DIAGNOSIS — G9340 Encephalopathy, unspecified: Secondary | ICD-10-CM | POA: Diagnosis not present

## 2024-07-08 DIAGNOSIS — I4891 Unspecified atrial fibrillation: Secondary | ICD-10-CM | POA: Diagnosis not present

## 2024-07-08 DIAGNOSIS — R569 Unspecified convulsions: Secondary | ICD-10-CM | POA: Diagnosis not present

## 2024-07-08 LAB — POCT I-STAT 7, (LYTES, BLD GAS, ICA,H+H)
Acid-base deficit: 3 mmol/L — ABNORMAL HIGH (ref 0.0–2.0)
Bicarbonate: 22.7 mmol/L (ref 20.0–28.0)
Calcium, Ion: 1.22 mmol/L (ref 1.15–1.40)
HCT: 38 % — ABNORMAL LOW (ref 39.0–52.0)
Hemoglobin: 12.9 g/dL — ABNORMAL LOW (ref 13.0–17.0)
O2 Saturation: 100 %
Patient temperature: 37.5
Potassium: 4.3 mmol/L (ref 3.5–5.1)
Sodium: 137 mmol/L (ref 135–145)
TCO2: 24 mmol/L (ref 22–32)
pCO2 arterial: 41.8 mmHg (ref 32–48)
pH, Arterial: 7.345 — ABNORMAL LOW (ref 7.35–7.45)
pO2, Arterial: 411 mmHg — ABNORMAL HIGH (ref 83–108)

## 2024-07-08 LAB — GLUCOSE, CAPILLARY
Glucose-Capillary: 135 mg/dL — ABNORMAL HIGH (ref 70–99)
Glucose-Capillary: 74 mg/dL (ref 70–99)
Glucose-Capillary: 82 mg/dL (ref 70–99)
Glucose-Capillary: 83 mg/dL (ref 70–99)
Glucose-Capillary: 91 mg/dL (ref 70–99)
Glucose-Capillary: 92 mg/dL (ref 70–99)
Glucose-Capillary: 97 mg/dL (ref 70–99)

## 2024-07-08 LAB — HEPARIN LEVEL (UNFRACTIONATED)
Heparin Unfractionated: 0.64 [IU]/mL (ref 0.30–0.70)
Heparin Unfractionated: 0.58 [IU]/mL (ref 0.30–0.70)

## 2024-07-08 LAB — CBC
HCT: 48.2 % (ref 39.0–52.0)
Hemoglobin: 15.4 g/dL (ref 13.0–17.0)
MCH: 31 pg (ref 26.0–34.0)
MCHC: 32 g/dL (ref 30.0–36.0)
MCV: 97.2 fL (ref 80.0–100.0)
Platelets: 171 K/uL (ref 150–400)
RBC: 4.96 MIL/uL (ref 4.22–5.81)
RDW: 12.9 % (ref 11.5–15.5)
WBC: 12 K/uL — ABNORMAL HIGH (ref 4.0–10.5)
nRBC: 0 % (ref 0.0–0.2)

## 2024-07-08 LAB — APTT
aPTT: 72 s — ABNORMAL HIGH (ref 24–36)
aPTT: 77 s — ABNORMAL HIGH (ref 24–36)

## 2024-07-08 LAB — TRIGLYCERIDES: Triglycerides: 56 mg/dL (ref <150)

## 2024-07-08 SURGERY — BRAIN BIOPSY
Anesthesia: General | Laterality: Right

## 2024-07-08 MED ORDER — FENTANYL 2500MCG IN NS 250ML (10MCG/ML) PREMIX INFUSION
0.0000 ug/h | INTRAVENOUS | Status: AC
  Administered 2024-07-08: 25 ug/h via INTRAVENOUS
  Filled 2024-07-08: qty 250

## 2024-07-08 MED ORDER — DEXTROSE-SODIUM CHLORIDE 5-0.9 % IV SOLN
INTRAVENOUS | Status: AC
  Administered 2024-07-08: 50 mL/h via INTRAVENOUS

## 2024-07-08 MED ORDER — MIDAZOLAM-SODIUM CHLORIDE 100-0.9 MG/100ML-% IV SOLN
0.0000 mg/h | INTRAVENOUS | Status: DC
  Administered 2024-07-08: 4 mg/h via INTRAVENOUS
  Administered 2024-07-08: 10 mg/h via INTRAVENOUS
  Administered 2024-07-10: 2 mg/h via INTRAVENOUS
  Filled 2024-07-08 (×3): qty 100

## 2024-07-08 MED ORDER — LEVETIRACETAM (KEPPRA) 500 MG/5 ML ADULT IV PUSH
1500.0000 mg | Freq: Two times a day (BID) | INTRAVENOUS | Status: DC
  Administered 2024-07-08 – 2024-07-12 (×9): 1500 mg via INTRAVENOUS
  Filled 2024-07-08 (×9): qty 15

## 2024-07-08 MED ORDER — VALPROATE SODIUM 100 MG/ML IV SOLN
1500.0000 mg | Freq: Once | INTRAVENOUS | Status: AC
  Administered 2024-07-08: 1500 mg via INTRAVENOUS
  Filled 2024-07-08: qty 15

## 2024-07-08 MED ORDER — DEXTROSE-SODIUM CHLORIDE 5-0.9 % IV SOLN: INTRAVENOUS | Status: DC

## 2024-07-08 MED ORDER — MIDAZOLAM HCL 2 MG/2ML IJ SOLN
4.0000 mg | Freq: Once | INTRAMUSCULAR | Status: AC
  Administered 2024-07-08: 4 mg via INTRAVENOUS
  Filled 2024-07-08: qty 4

## 2024-07-08 MED ORDER — ATORVASTATIN CALCIUM 40 MG PO TABS
40.0000 mg | ORAL_TABLET | Freq: Every day | ORAL | Status: DC
  Administered 2024-07-08 – 2024-07-24 (×17): 40 mg
  Filled 2024-07-08 (×16): qty 1

## 2024-07-08 NOTE — Plan of Care (Incomplete)
 This patient remains on [facility] as of time of writing. The patient is orally intubated and mechanically ventilated. The patient is receiving continuous infusions of midazolam , heparin , and D5NS via PIV; PIV access only. The patient is receiving LTM EEG which is set-up in the room. The patient has exhibited isolated rhythmic twitching in his right shoulder with no other concurrent signs of seizure-like activity; EMU was notified by this RN of the twitches. Foley catheter with continuous temperature monitoring remains in place. OGT for meds; clamped at this moment. The patient's spouse remains at bedside overnight, and the patient's admission profile is completed overnight by this RN with the patient's spouse's assistance.   Problem: Activity: Goal: Ability to tolerate increased activity will improve Outcome: Progressing   Problem: Respiratory: Goal: Ability to maintain a clear airway and adequate ventilation will improve Outcome: Progressing   Problem: Role Relationship: Goal: Method of communication will improve Outcome: Progressing   Problem: Education: Goal: Knowledge of General Education information will improve Description: Including pain rating scale, medication(s)/side effects and non-pharmacologic comfort measures Outcome: Progressing   Problem: Health Behavior/Discharge Planning: Goal: Ability to manage health-related needs will improve Outcome: Progressing   Problem: Clinical Measurements: Goal: Ability to maintain clinical measurements within normal limits will improve Outcome: Progressing Goal: Will remain free from infection Outcome: Progressing Goal: Diagnostic test results will improve Outcome: Progressing Goal: Respiratory complications will improve Outcome: Progressing Goal: Cardiovascular complication will be avoided Outcome: Progressing   Problem: Activity: Goal: Risk for activity intolerance will decrease Outcome: Progressing   Problem: Nutrition: Goal:  Adequate nutrition will be maintained Outcome: Progressing   Problem: Coping: Goal: Level of anxiety will decrease Outcome: Progressing   Problem: Elimination: Goal: Will not experience complications related to bowel motility Outcome: Progressing Goal: Will not experience complications related to urinary retention Outcome: Progressing   Problem: Pain Managment: Goal: General experience of comfort will improve and/or be controlled Outcome: Progressing   Problem: Safety: Goal: Ability to remain free from injury will improve Outcome: Progressing   Problem: Skin Integrity: Goal: Risk for impaired skin integrity will decrease Outcome: Progressing

## 2024-07-08 NOTE — Consult Note (Signed)
 Reason for Consult: Brain Mass Referring Physician: Arlin Krebs, MD  Assessment/Plan: I communicated with the team yesterday and was involved in his care overnight when he was in status. 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  In his current condition I do not recommend a biopsy as that may worsen the irritation and make management of the status epilepticus more difficult. I spoke in detail to his family (wife, daughter, brother and SIL) and explained the condition and gave the the differential diagnosis. I explained to them that I would like for him to improve first prior to taking him to surgery and explained what a biopsy entails and the risk of hemorrhage and a false negative biopsy.  I will remain involved in his care and assist in his management as much as it is requested and perform the surgery at the earliest and safest time.  Dino Sable, MD Neurosurgeon [facility] Neurosurgery  Wayne Molina is an 68 y.o. male.  HPI: 68 y.o. male with hx of Htn, HLD, pAfibb on eliquis , OSA who came to the ED with acute onset L arm weakness and L hemianopsia. LKW reported to be around 2230 on 07/06/24.  He works at the hospital. Was noted to be pale by his coworkers and felt very dizzy. According to his wife, he was fervently typing away on his phone. His colleague to the ED and there he had a seizure.  He is otherwise healthy except for Afib and OSA    Past Medical History:  Diagnosis Date   Anxiety    Arthritis    fingers   Chronic atrial fibrillation (HCC)    Depression    Family history of breast cancer    Family history of cancer of male genital organ    Family history of gene mutation    BRIP1   Family history of malignant neoplasm of gastrointestinal tract    Hyperlipidemia    Hypertension    Personal history of colonic polyps 11/12/2006   Sleep apnea      Past Surgical History:  Procedure Laterality Date   2-D echocardiogram  07/22/2010   Ejection fraction greater than 55%. Left atrium moderately dilated. Right atrium moderately dilated. 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 Mild to Moderate MR. Mild to moderate TR.   ATRIAL ABLATION SURGERY  2004   EP IMPLANTABLE DEVICE N/A 02/11/2016   Procedure: Loop Recorder Insertion;  Surgeon: Jerel Balding, MD;  Location: [facility] INVASIVE CV LAB;  Service: Cardiovascular;  Laterality: N/A;   Exercise Myoview stress test  07/08/2000   Nonischemic low-risk.   HIP FRACTURE SURGERY  1970s   IR GENERIC HISTORICAL  01/21/2017   IR RADIOLOGIST EVAL & MGMT 01/21/2017 [facility]-INTER[facility] RAD    Family History  Problem Relation Age of Onset   Dementia Mother    Heart disease Father    Diabetes Father    Other Sister        BRIP1 gene mutation   Kidney disease Brother    Colon cancer Maternal Uncle    Lung cancer Paternal Aunt        d. >50   Cancer Paternal Aunt        unknown type, d. >50   Cancer Cousin 17       gynecologic (paternal first cousin)   Colon cancer Nephew 27       arising in colon polyp   Cancer  Niece 85       gynecologic; niece in her 59's   Breast cancer Niece 45   Other Niece        BRIP1 gene mutation   Cancer Paternal Great-grandmother        abdominal cancer (PGF's mother) great grandmother    Social History:  reports that he has quit smoking. His smoking use included cigarettes. He has never used smokeless tobacco. He reports that he does not drink alcohol and does not use drugs.  Allergies: No Known Allergies  Medications: I have reviewed the patient's current medications.  Results for orders placed or performed during the hospital encounter of 07/06/24 (from the past 48 hours)  Protime-INR     Status: None   Collection Time: 07/07/24 12:03 AM  Result Value Ref Range   Prothrombin Time 14.8 11.4 - 15.2 seconds   INR 1.1 0.8 - 1.2    Comment: (NOTE) INR goal varies based on device  and disease states. Performed at [facility], [address] [place], [place] [address]   APTT     Status: None   Collection Time: 07/07/24 12:03 AM  Result Value Ref Range   aPTT 36 24 - 36 seconds    Comment: Performed at [facility], [address], [place], [place] [address]  CBC     Status: None   Collection Time: 07/07/24 12:03 AM  Result Value Ref Range   WBC 8.2 4.0 - 10.5 K/uL   RBC 5.04 4.22 - 5.81 MIL/uL   Hemoglobin 15.6 13.0 - 17.0 g/dL   HCT 53.1 60.9 - 47.9 %   MCV 92.9 80.0 - 100.0 fL   MCH 31.0 26.0 - 34.0 pg   MCHC 33.3 30.0 - 36.0 g/dL   RDW 87.2 88.4 - 84.4 %   Platelets 250 150 - 400 K/uL   nRBC 0.0 0.0 - 0.2 %    Comment: Performed at [facility], [address], [place], [place] [address]  Differential     Status: None   Collection Time: 07/07/24 12:03 AM  Result Value Ref Range   Neutrophils Relative % 47 %   Neutro Abs 3.9 1.7 - 7.7 K/uL   Lymphocytes Relative 37 %   Lymphs Abs 3.0 0.7 - 4.0 K/uL   Monocytes Relative 12 %   Monocytes Absolute 0.9 0.1 - 1.0 K/uL   Eosinophils Relative 3 %   Eosinophils Absolute 0.2 0.0 - 0.5 K/uL   Basophils Relative 1 %   Basophils Absolute 0.1 0.0 - 0.1 K/uL   Immature Granulocytes 0 %   Abs Immature Granulocytes 0.01 0.00 - 0.07 K/uL    Comment: Performed at [facility] Lab, [address] [place], [place] [address]  Comprehensive metabolic panel     Status: Abnormal   Collection Time: 07/07/24 12:03 AM  Result Value Ref Range   Sodium 137 135 - 145 mmol/L   Potassium 4.4 3.5 - 5.1 mmol/L    Comment: HEMOLYSIS AT THIS LEVEL MAY AFFECT RESULT   Chloride 102 98 - 111 mmol/L   CO2 25 22 - 32 mmol/L   Glucose, Bld 125 (H) 70 - 99 mg/dL    Comment: Glucose reference range applies only to samples taken after fasting for at least 8 hours.   BUN 21 8 - 23 mg/dL   Creatinine, Ser 8.97 0.61 - 1.24 mg/dL   Calcium  9.7 8.9 - 10.3 mg/dL   Total Protein 7.3 6.5 -  8.1 g/dL   Albumin 4.1 3.5 -  5.0 g/dL   AST 32 15 - 41 U/L    Comment: HEMOLYSIS AT THIS LEVEL MAY AFFECT RESULT   ALT 24 0 - 44 U/L    Comment: HEMOLYSIS AT THIS LEVEL MAY AFFECT RESULT   Alkaline Phosphatase 53 38 - 126 U/L   Total Bilirubin 1.7 (H) 0.0 - 1.2 mg/dL    Comment: HEMOLYSIS AT THIS LEVEL MAY AFFECT RESULT   GFR, Estimated >60 >60 mL/min    Comment: (NOTE) Calculated using the CKD-EPI Creatinine Equation (2021)    Anion gap 10 5 - 15    Comment: Performed at [facility] Lab, [address], [place], [place] [address]  Ethanol     Status: None   Collection Time: 07/07/24 12:03 AM  Result Value Ref Range   Alcohol, Ethyl (B) <15 <15 mg/dL    Comment: (NOTE) For medical purposes only. Performed at [facility], [address], [place], [place] [address]   I-stat chem 8, ED     Status: Abnormal   Collection Time: 07/07/24 12:08 AM  Result Value Ref Range   Sodium 138 135 - 145 mmol/L   Potassium 4.2 3.5 - 5.1 mmol/L   Chloride 102 98 - 111 mmol/L   BUN 24 (H) 8 - 23 mg/dL   Creatinine, Ser 8.99 0.61 - 1.24 mg/dL   Glucose, Bld 882 (H) 70 - 99 mg/dL    Comment: Glucose reference range applies only to samples taken after fasting for at least 8 hours.   Calcium , Ion 1.23 1.15 - 1.40 mmol/L   TCO2 25 22 - 32 mmol/L   Hemoglobin 15.6 13.0 - 17.0 g/dL   HCT 53.9 60.9 - 47.9 %  CBG monitoring, ED     Status: Abnormal   Collection Time: 07/07/24 12:30 AM  Result Value Ref Range   Glucose-Capillary 121 (H) 70 - 99 mg/dL    Comment: Glucose reference range applies only to samples taken after fasting for at least 8 hours.  CBG monitoring, ED     Status: Abnormal   Collection Time: 07/07/24  1:16 AM  Result Value Ref Range   Glucose-Capillary 139 (H) 70 - 99 mg/dL    Comment: Glucose reference range applies only to samples taken after fasting for at least 8 hours.  HIV Antibody (routine testing w rflx)     Status: None   Collection Time: 07/07/24  6:02 AM  Result Value Ref Range   HIV  Screen 4th Generation wRfx Non Reactive Non Reactive    Comment: Performed at [facility], [address] [place], [place] [address]  Basic metabolic panel     Status: Abnormal   Collection Time: 07/07/24  6:02 AM  Result Value Ref Range   Sodium 136 135 - 145 mmol/L   Potassium 5.1 3.5 - 5.1 mmol/L    Comment: HEMOLYSIS AT THIS LEVEL MAY AFFECT RESULT   Chloride 103 98 - 111 mmol/L   CO2 23 22 - 32 mmol/L   Glucose, Bld 170 (H) 70 - 99 mg/dL    Comment: Glucose reference range applies only to samples taken after fasting for at least 8 hours.   BUN 20 8 - 23 mg/dL   Creatinine, Ser 8.91 0.61 - 1.24 mg/dL   Calcium  9.0 8.9 - 10.3 mg/dL   GFR, Estimated >39 >39 mL/min    Comment: (NOTE) Calculated using the CKD-EPI Creatinine Equation (2021)  Anion gap 10 5 - 15    Comment: Performed at [facility], [address], [place], [place] [address]  CBC     Status: Abnormal   Collection Time: 07/07/24  6:02 AM  Result Value Ref Range   WBC 13.4 (H) 4.0 - 10.5 K/uL   RBC 5.01 4.22 - 5.81 MIL/uL   Hemoglobin 15.5 13.0 - 17.0 g/dL   HCT 52.7 60.9 - 47.9 %   MCV 94.2 80.0 - 100.0 fL   MCH 30.9 26.0 - 34.0 pg   MCHC 32.8 30.0 - 36.0 g/dL   RDW 87.3 88.4 - 84.4 %   Platelets 224 150 - 400 K/uL   nRBC 0.0 0.0 - 0.2 %    Comment: Performed at [facility], [address] [place], [place] [address]  Hemoglobin A1c     Status: Abnormal   Collection Time: 07/07/24  6:02 AM  Result Value Ref Range   Hgb A1c MFr Bld 6.0 (H) 4.8 - 5.6 %    Comment: (NOTE)         Prediabetes: 5.7 - 6.4         Diabetes: >6.4         Glycemic control for adults with diabetes: <7.0    Mean Plasma Glucose 126 mg/dL    Comment: (NOTE) Performed At: [facility] [address] [place], [place] [id number] Jennette Shorter MD Ey:[id number]   Lipid panel     Status: None   Collection Time: 07/07/24  6:02 AM  Result Value Ref Range   Cholesterol 114 0 - 200 mg/dL   Triglycerides 40  <849 mg/dL   HDL 44 >59 mg/dL   Total CHOL/HDL Ratio 2.6 RATIO   VLDL 8 0 - 40 mg/dL   LDL Cholesterol 62 0 - 99 mg/dL    Comment:        Total Cholesterol/HDL:CHD Risk Coronary Heart Disease Risk Table                     Men   Women  1/2 Average Risk   3.4   3.3  Average Risk       5.0   4.4  2 X Average Risk   9.6   7.1  3 X Average Risk  23.4   11.0        Use the calculated Patient Ratio above and the CHD Risk Table to determine the patient's CHD Risk.        ATP III CLASSIFICATION (LDL):  <100     mg/dL   Optimal  899-870  mg/dL   Near or Above                    Optimal  130-159  mg/dL   Borderline  839-810  mg/dL   High  >809     mg/dL   Very High Performed at [facility], [address], [place], [place] [address]   TSH     Status: None   Collection Time: 07/07/24  6:02 AM  Result Value Ref Range   TSH 1.615 0.350 - 4.500 uIU/mL    Comment: Performed by a 3rd Generation assay with a functional sensitivity of <=0.01 uIU/mL. Performed at [facility], [address], [place], [place] [address]   MRSA Next Gen by PCR, Nasal     Status: None   Collection Time: 07/07/24  1:49 PM   Specimen: Nasal Mucosa;  Nasal Swab  Result Value Ref Range   MRSA by PCR Next Gen NOT DETECTED NOT DETECTED    Comment: (NOTE) The GeneXpert MRSA Assay (FDA approved for NASAL specimens only), is one component of a comprehensive MRSA colonization surveillance program. It is not intended to diagnose MRSA infection nor to guide or monitor treatment for MRSA infections. Test performance is not FDA approved in patients less than 77 years old. Performed at [facility], [address], [place], [place] [address]   I-STAT 7, (LYTES, BLD GAS, ICA, H+H)     Status: Abnormal   Collection Time: 07/07/24  2:00 PM  Result Value Ref Range   pH, Arterial 7.345 (L) 7.35 - 7.45   pCO2 arterial 41.8 32 - 48 mmHg   pO2, Arterial 411 (H) 83 - 108 mmHg   Bicarbonate 22.7 20.0 - 28.0  mmol/L   TCO2 24 22 - 32 mmol/L   O2 Saturation 100 %   Acid-base deficit 3.0 (H) 0.0 - 2.0 mmol/L   Sodium 137 135 - 145 mmol/L   Potassium 4.3 3.5 - 5.1 mmol/L   Calcium , Ion 1.22 1.15 - 1.40 mmol/L   HCT 38.0 (L) 39.0 - 52.0 %   Hemoglobin 12.9 (L) 13.0 - 17.0 g/dL   Patient temperature 62.4 C    Collection site RADIAL, ALLEN'S TEST ACCEPTABLE    Drawn by RT    Sample type ARTERIAL   Magnesium     Status: None   Collection Time: 07/07/24  2:46 PM  Result Value Ref Range   Magnesium 2.0 1.7 - 2.4 mg/dL    Comment: Performed at [facility], [address] [place], [place] [address]  Glucose, capillary     Status: None   Collection Time: 07/07/24  7:53 PM  Result Value Ref Range   Glucose-Capillary 91 70 - 99 mg/dL    Comment: Glucose reference range applies only to samples taken after fasting for at least 8 hours.  Glucose, capillary     Status: None   Collection Time: 07/07/24 11:53 PM  Result Value Ref Range   Glucose-Capillary 76 70 - 99 mg/dL    Comment: Glucose reference range applies only to samples taken after fasting for at least 8 hours.  Glucose, capillary     Status: None   Collection Time: 07/08/24  4:06 AM  Result Value Ref Range   Glucose-Capillary 92 70 - 99 mg/dL    Comment: Glucose reference range applies only to samples taken after fasting for at least 8 hours.  Triglycerides     Status: None   Collection Time: 07/08/24  5:23 AM  Result Value Ref Range   Triglycerides 56 <150 mg/dL    Comment: Performed at [facility], [address], [place], [place] [address]  Heparin  level (unfractionated)     Status: None   Collection Time: 07/08/24  5:23 AM  Result Value Ref Range   Heparin  Unfractionated 0.64 0.30 - 0.70 IU/mL    Comment: (NOTE) The clinical reportable range upper limit is being lowered to >1.10 to align with the FDA approved guidance for the current laboratory assay.  If heparin  results are below expected values, and patient  dosage has  been confirmed, suggest follow up testing of antithrombin III levels. Performed at [facility], [address], [place], [place] [address]   APTT     Status: Abnormal   Collection Time: 07/08/24  5:23 AM  Result Value Ref  Range   aPTT 72 (H) 24 - 36 seconds    Comment:        IF BASELINE aPTT IS ELEVATED, SUGGEST PATIENT RISK ASSESSMENT BE USED TO DETERMINE APPROPRIATE ANTICOAGULANT THERAPY. Performed at [facility], [address], [place], [place] [address]   CBC     Status: Abnormal   Collection Time: 07/08/24  7:32 AM  Result Value Ref Range   WBC 12.0 (H) 4.0 - 10.5 K/uL   RBC 4.96 4.22 - 5.81 MIL/uL   Hemoglobin 15.4 13.0 - 17.0 g/dL   HCT 51.7 60.9 - 47.9 %   MCV 97.2 80.0 - 100.0 fL   MCH 31.0 26.0 - 34.0 pg   MCHC 32.0 30.0 - 36.0 g/dL   RDW 87.0 88.4 - 84.4 %   Platelets 171 150 - 400 K/uL   nRBC 0.0 0.0 - 0.2 %    Comment: Performed at [facility] Lab, [address] [place], [place] [address]  Glucose, capillary     Status: None   Collection Time: 07/08/24  7:56 AM  Result Value Ref Range   Glucose-Capillary 83 70 - 99 mg/dL    Comment: Glucose reference range applies only to samples taken after fasting for at least 8 hours.    Overnight EEG with video Result Date: 07/08/2024 Shelton Arlin KIDD, MD     07/08/2024  6:21 AM Patient Name: Wayne Molina MRN: [id number] Epilepsy Attending: Arlin KIDD Shelton Referring Physician/Provider: Khaliqdina, Salman, MD Duration: 07/07/2024 9070 to 830/2025 0615 Patient history: 68 y.o. 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 EEG to evaluate for seizure Level of alertness: comatose/ lethargic AEDs during EEG study: LEV, propofol , Versed ,VPA Technical aspects: This EEG study was done with scalp electrodes positioned according to the 10-20 International system of electrode placement. Electrical activity was reviewed with band  pass filter of 1-70Hz , sensitivity of 7 uV/mm, display speed of 12mm/sec with a 60Hz  notched filter applied as appropriate. EEG data were recorded continuously and digitally stored.  Video monitoring was available and reviewed as appropriate. Description: EEG showed continuous generalized and lateralized right hemisphere 3 to 6 Hz theta-delta slowing admixed with 13-15hz  beta activity. At the beginning of the study, EEG also showed lateralized periodic discharges ( LPDs) with overriding fast activity at 1-1.5Hz  in right hemisphere. As sedation was adjusted, overriding fast activity improved Hyperventilation and photic stimulation were not performed.   ABNORMALITY - Lateralized periodic discharges with overriding fast activity ( LPD +F)  right hemisphere - Continuous slow, generalized and lateralized right hemisphere IMPRESSION: This study showed evidence of epileptogenicity and cortical dysfunction arising from right hemisphere likely due to underlying structural abnormality. This eeg pattern is on the ictal-interictal continuum with high risk of seizure recurrence. Additionally there is severe diffuse encephalopathy, likely related to sedation. No seizures were seen throughout the recording. Priyanka KIDD Shelton   CT HEAD WO CONTRAST ( ) Result Date: 07/08/2024 CLINICAL DATA:  Initial evaluation for acute mental status change, unknown cause. EXAM: CT HEAD WITHOUT CONTRAST TECHNIQUE: Contiguous axial images were obtained from the base of the skull through the vertex without intravenous contrast. RADIATION DOSE REDUCTION: This exam was performed according to the departmental dose-optimization program which includes automated exposure control, adjustment of the mA and/or kV according to patient size and/or use of iterative reconstruction technique. COMPARISON:  Comparison made with prior studies from 07/07/2024. FINDINGS: Brain:  Examination somewhat technically limited by streak artifact from overlying EEG leads.  Previously identified infiltrating mass involving the right temporal lobe and posterior right splenium again seen, relatively stable from prior. Surrounding vasogenic edema, also unchanged. No acute intracranial hemorrhage. No other acute large vessel territory infarct. No other mass lesion or mass effect. No hydrocephalus or extra-axial fluid collection. Few small chronic cerebellar infarcts again noted. Vascular: No abnormal hyperdense vessel. Calcified atherosclerosis present at skull base. Skull: No acute finding.  EEG leads overlie the scalp. Sinuses/Orbits: Globes and orbital soft tissues within normal limits. Paranasal sinuses and mastoid air cells remain largely clear. Other: Endotracheal tube partially visualized. IMPRESSION: 1. Stable head CT with infiltrating mass involving the posterior right temporal lobe and splenium with surrounding vasogenic edema. 2. No other new acute intracranial abnormality. Electronically Signed   By: Morene Hoard M.D.   On: 07/08/2024 02:47   ECHOCARDIOGRAM COMPLETE Result Date: 07/07/2024    ECHOCARDIOGRAM REPORT   Patient Name:   SANDRO BURGO Date of Exam: 07/07/2024 Medical Rec #:  [id number]     Height:       69.0 in Accession #:    [id number]    Weight:       163.0 lb Date of Birth:  12-01-1955      BSA:          1.894 m Patient Age:    68 years      BP:           104/66 mmHg Patient Gender: M             HR:           69 bpm. Exam Location:  Inpatient Procedure: 2D Echo, Color Doppler and Cardiac Doppler (Both Spectral and Color            Flow Doppler were utilized during procedure). Indications:    Stroke  History:        Patient has prior history of Echocardiogram examinations, most                 recent 06/11/2022.  Sonographer:    Tinnie Rgers RDCS Referring Phys: [id number] COURTNEY F HORTON IMPRESSIONS  1. Left ventricular ejection fraction, by estimation, is 60 to 65%. Left ventricular ejection fraction by PLAX is 61 %. The left ventricle has normal function.  The left ventricle has no regional wall motion abnormalities. There is moderate left ventricular hypertrophy. Left ventricular diastolic function could not be evaluated.  2. Right ventricular systolic function is moderately reduced. The right ventricular size is mildly enlarged.  3. Left atrial size was moderately dilated.  4. Right atrial size was severely dilated.  5. The mitral valve is grossly normal. Trivial mitral valve regurgitation.  6. The aortic valve is calcified. Aortic valve regurgitation is mild. Aortic valve sclerosis/calcification is present, without any evidence of aortic stenosis.  7. The inferior vena cava is dilated in size with <50% respiratory variability, suggesting right atrial pressure of 15 mmHg.  8. Cannot exclude a small PFO.  9. Rhythm strip during this exam demonstrates atrial fibrillation. Comparison(s): Changes from prior study are noted. 03/15/2024: LVEF 65-70%, mildly dilated RV with normal systolic function. FINDINGS  Left Ventricle: Left ventricular ejection fraction, by estimation, is 60 to 65%. Left ventricular ejection fraction by PLAX is 61 %. The left ventricle has normal function. The left ventricle has no regional wall motion abnormalities. The left ventricular internal cavity size was normal in size. There is moderate  left ventricular hypertrophy. Left ventricular diastolic function could not be evaluated due to atrial fibrillation. Left ventricular diastolic function could not be evaluated. Right Ventricle: The right ventricular size is mildly enlarged. No increase in right ventricular wall thickness. Right ventricular systolic function is moderately reduced. Left Atrium: Left atrial size was moderately dilated. Right Atrium: Right atrial size was severely dilated. Pericardium: There is no evidence of pericardial effusion. Mitral Valve: The mitral valve is grossly normal. Trivial mitral valve regurgitation. Tricuspid Valve: The tricuspid valve is grossly normal. Tricuspid  valve regurgitation is trivial. Aortic Valve: The aortic valve is calcified. Aortic valve regurgitation is mild. Aortic valve sclerosis/calcification is present, without any evidence of aortic stenosis. Pulmonic Valve: The pulmonic valve was normal in structure. Pulmonic valve regurgitation is not visualized. Aorta: The aortic root and ascending aorta are structurally normal, with no evidence of dilitation. Venous: The inferior vena cava is dilated in size with less than 50% respiratory variability, suggesting right atrial pressure of 15 mmHg. IAS/Shunts: Cannot exclude a small PFO. EKG: Rhythm strip during this exam demonstrates atrial fibrillation.  LEFT VENTRICLE PLAX 2D LV EF:         Left            Diastology                ventricular     LV e' lateral: 14.10 cm/s                ejection                fraction by                PLAX is 61                %. LVIDd:         4.27 cm LVIDs:         2.88 cm LV PW:         1.29 cm LV IVS:        1.24 cm LVOT diam:     2.00 cm LV SV:         44 LV SV Index:   23 LVOT Area:     3.14 cm  RIGHT VENTRICLE            IVC RV S prime:     6.66 cm/s  IVC diam: 2.23 cm TAPSE (M-mode): 1.3 cm LEFT ATRIUM             Index        RIGHT ATRIUM           Index LA diam:        4.67 cm 2.47 cm/m   RA Area:     24.70 cm LA Vol (A2C):   73.9 ml 39.02 ml/m  RA Volume:   73.70 ml  38.91 ml/m LA Vol (A4C):   72.9 ml 38.49 ml/m LA Biplane Vol: 76.0 ml 40.13 ml/m  AORTIC VALVE LVOT Vmax:   76.60 cm/s LVOT Vmean:  47.000 cm/s LVOT VTI:    0.140 m  AORTA Ao Root diam: 3.04 cm Ao Asc diam:  2.92 cm  SHUNTS Systemic VTI:  0.14 m Systemic Diam: 2.00 cm Vinie Maxcy MD Electronically signed by Vinie Maxcy MD Signature Date/Time: 07/07/2024/4:18:11 PM    Final    DG Chest Portable 1 View Result Date: 07/07/2024 CLINICAL DATA:  Intubation. EXAM: PORTABLE CHEST 1 VIEW COMPARISON:  August 08, 2022. FINDINGS: Stable cardiomegaly.  Endotracheal and nasogastric tubes are in grossly good  position. Lungs are clear. Bony thorax is unremarkable. IMPRESSION: Endotracheal and nasogastric tubes are in grossly good position. Electronically Signed   By: Lynwood Landy Raddle M.D.   On: 07/07/2024 13:25   DG Abd Portable 1 View Result Date: 07/07/2024 CLINICAL DATA:  Nasogastric tube placement. EXAM: PORTABLE ABDOMEN - 1 VIEW COMPARISON:  None Available. FINDINGS: Two images of the abdomen were obtained. On the second image, nasogastric tube tip is seen in expected position of proximal stomach. IMPRESSION: Nasogastric tube tip is seen in expected position of proximal stomach. Electronically Signed   By: Lynwood Landy Raddle M.D.   On: 07/07/2024 13:23   MR BRAIN W WO CONTRAST Result Date: 07/07/2024 CLINICAL DATA:  68 year old male with new onset seizure. Code stroke presentation. EXAM: MRI HEAD WITHOUT AND WITH CONTRAST TECHNIQUE: Multiplanar, multiecho pulse sequences of the brain and surrounding structures were obtained without and with intravenous contrast. CONTRAST:  7.5mL GADAVIST  GADOBUTROL  1 MMOL/ML IV SOLN COMPARISON:  Head CTs and CTA earlier today. FINDINGS: Study is mildly degraded by motion artifact despite repeated imaging attempts. Brain: Nodular, heterogeneously enhancing, and elongated tumor in the posterior right hemisphere tracks from the splenium of the corpus callosum near midline laterally into the right temporal lobe. Enhancing component of tumor encompasses an area of about 3 cm by close to 6 cm x 3.3 cm (AP by transverse by CC). Surrounding confluent T2 and FLAIR hyperintensity. Mild hemosiderin or mineralization on T2*. Abnormal diffusion which appears to be on the basis of hypercellularity, with mildly diminished T2 hyperintensity in those areas. Confluent periventricular involvement along the posterior right lateral ventricle, atrium. No contralateral or additional abnormal enhancement identified. No dural thickening identified. No midline shift or significant intracranial mass effect  at this time. Normal basilar cisterns. No superimposed restricted diffusion suggestive of acute infarction. No ventriculomegaly, extra-axial collection or acute intracranial hemorrhage. Cervicomedullary junction and pituitary are within normal limits. 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 No other hemosiderin identified. No cortical encephalomalacia identified. Coronal thin slice imaging. Tumoral edema adjacent to the right hippocampal formation which otherwise appears within normal limits. Other mesial temporal lobe structures within normal limits. Vascular: Major intracranial vascular flow voids appear preserved. Following contrast the major dural venous sinuses are enhancing and appear to be patent. Skull and upper cervical spine: Negative. Visualized bone marrow signal is within normal limits. Sinuses/Orbits: Leftward gaze, otherwise negative. Paranasal Visualized paranasal sinuses and mastoids are stable and well aerated. Other: Visible internal auditory structures appear normal. Scalp EEG leads in place. IMPRESSION: 1. Positive for relatively large and infiltrative tumor with heterogeneous enhancement tracking from the splenium of the corpus callosum near midline into the right temporal lobe. Confluent regional tumoral edema. No midline shift or significant intracranial mass effect. Top differential considerations are Glioblastoma and CNS Lymphoma. 2. No other acute intracranial abnormality, with underlying small chronic cerebellar infarcts. Electronically Signed   By: VEAR Hurst M.D.   On: 07/07/2024 08:53   CT HEAD WO CONTRAST ( ) Result Date: 07/07/2024 CLINICAL DATA:  Initial evaluation for acute mental status change, unknown cause. EXAM: CT HEAD WITHOUT CONTRAST TECHNIQUE: Contiguous axial images were obtained from the base of the skull through the vertex without intravenous contrast. RADIATION DOSE REDUCTION: This exam was performed according to the  departmental dose-optimization program which includes automated exposure control, adjustment of the mA and/or kV according to patient size and/or use of iterative reconstruction technique. COMPARISON:  Comparison made with CTs from earlier the same day. FINDINGS: Brain: IV contrast materials on board related to CTA performed earlier the same evening. Now evident is a probable enhancing intra-axial mass involving the posterior right frontotemporal region, in the region of previously suspected right MCA infarct. The mass is enhancing and appears bilobed. Dominant component at the right temporal region measures 2.9 cm (series 5, image 15). Additional dominant component partially extending towards the midline at the splenium measures up to 3.7 cm (series 5, image 18). Surrounding hypodensity therefore likely reflects vasogenic edema. Mild mass effect on the adjacent right lateral ventricle without midline shift. No other visible mass lesion or abnormal enhancement. No acute intracranial hemorrhage. No acute large vessel territory infarct. No extra-axial fluid collection. Suspected small remote right cerebellar infarct noted. Vascular: IV contrast material seen throughout the intracranial vasculature. Calcified atherosclerosis present at skull base. Skull: Scalp soft tissues and calvarium demonstrate no new finding. Sinuses/Orbits: Globes orbital soft tissues demonstrate no acute finding. Paranasal sinuses and mastoid air cells remain clear. Other: None. IMPRESSION: 1. Now evident is an enhancing intra-axial mass involving the posterior right frontotemporal region, in the region of previously suspected right MCA infarct. The enhancing mass is bilobed, with dominant components measuring 2.9 cm and 3.7 cm at the right temporal lobe and splenium respectively. Surrounding vasogenic edema without midline shift. 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 Further evaluation with dedicated MRI, with and  without contrast, recommended. 2. No other acute intracranial abnormality. Case briefly discussed by telephone at the time of interpretation on 07/07/2024 at 1:35 a.m. to provider [facility]. Electronically Signed   By: Morene Hoard M.D.   On: 07/07/2024 01:46   CT ANGIO HEAD NECK W WO CM (CODE STROKE) Result Date: 07/07/2024 CLINICAL DATA:  Initial evaluation for acute neuro deficit, stroke. EXAM: CT ANGIOGRAPHY HEAD AND NECK WITH AND WITHOUT CONTRAST TECHNIQUE: Multidetector CT imaging of the head and neck was performed using the standard protocol during bolus administration of intravenous contrast. Multiplanar CT image reconstructions and MIPs were obtained to evaluate the vascular anatomy. Carotid stenosis measurements (when applicable) are obtained utilizing NASCET criteria, using the distal internal carotid diameter as the denominator. RADIATION DOSE REDUCTION: This exam was performed according to the departmental dose-optimization program which includes automated exposure control, adjustment of the mA and/or kV according to patient size and/or use of iterative reconstruction technique. CONTRAST:  75mL OMNIPAQUE  IOHEXOL  350 MG/ML SOLN COMPARISON:  CT from earlier the same day. FINDINGS: CTA NECK FINDINGS Aortic arch: Aortic arch within normal limits for caliber standard branch pattern. Aortic atherosclerosis. No significant stenosis about the origin the great vessels. Right carotid system: No evidence of dissection, stenosis (50% or greater), or occlusion. Left carotid system: Left common and internal carotid arteries are patent without dissection. Mixed plaque about the left carotid bulb with estimated 50% stenosis by NASCET criteria. 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 Distal reconstitution at the V3 segments via muscular collaterals, greater on the right (series 5, image 165, 160). Vertebral arteries are  patent as they course into the cranial vault. No visible stenosis or dissection. Skeleton: No worrisome osseous lesions.  Patient is edentulous. Other neck: No other acute finding. Upper chest: No other acute finding. Review of the MIP images confirms the above findings CTA HEAD FINDINGS Anterior circulation: Both internal carotid arteries are patent through the siphons without stenosis. A1 segments patent bilaterally. Right A1  hypoplastic. Normal anterior communicating artery complex. Anterior cerebral arteries patent without stenosis. No M1 stenosis or occlusion. No proximal MCA branch occlusion. Distal MCA branches perfused and fairly symmetric. Posterior circulation: Right V4 segment patent without stenosis. Left V4 segment patent proximally to the takeoff of the left PICA, but largely acute lose distally to the vertebrobasilar junction. Left PICA patent. Right PICA not well seen. Basilar diminutive but patent without stenosis. Superior cerebral arteries patent bilaterally. Left PCA supplied via the basilar. Right PCA supplied via the basilar as well as a robust right posterior communicating artery. Both PCAs patent to their distal aspects without significant stenosis. Venous sinuses: Grossly patent allowing for timing the contrast bolus. Anatomic variants: As above.  No aneurysm. Review of the MIP images confirms the above findings IMPRESSION: 1. Negative CTA for acute large vessel occlusion or other emergent finding. 2. 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 Distal left V4 segment subsequently reoccludes beyond the takeoff of the left PICA. 3. Atheromatous change about the left carotid bulb with estimated 50% stenosis by NASCET criteria. Aortic Atherosclerosis (ICD10-I70.0). Electronically Signed   By: Morene Hoard M.D.   On: 07/07/2024 00:50   CT HEAD CODE STROKE WO CONTRAST Result Date: 07/07/2024 CLINICAL  DATA:  Code stroke. Initial evaluation for acute neuro deficit, stroke. No other relevant history provided. EXAM: CT HEAD WITHOUT CONTRAST TECHNIQUE: Contiguous axial images were obtained from the base of the skull through the vertex without intravenous contrast. RADIATION DOSE REDUCTION: This exam was performed according to the departmental dose-optimization program which includes automated exposure control, adjustment of the mA and/or kV according to patient size and/or use of iterative reconstruction technique. COMPARISON:  None Available. FINDINGS: Brain: Cerebral volume within normal limits. 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 No associated hemorrhage. No significant mass effect. Gray-white matter differentiation otherwise maintained. No mass lesion or midline shift. No hydrocephalus or extra-axial fluid collection. Vascular: No convincing hyperdense vessel by CT. Scattered vascular calcifications noted within the carotid siphons. Skull: Scalp soft tissues within normal limits.  Calvarium intact. Sinuses/Orbits: Globes orbital soft tissues within normal limits. Paranasal sinuses and mastoid air cells are largely clear. Other: None. ASPECTS [facility] Stroke Program Early CT Score) - Ganglionic level infarction (caudate, lentiform nuclei, internal capsule, insula, M1-M3 cortex): 6 - Supraganglionic infarction (M4-M6 cortex): 2 Total score (0-10 with 10 being normal): 8 IMPRESSION: 1. 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 No associated hemorrhage or significant mass effect. 2. Aspects is 8. These results were communicated to Dr. Vanessa at 12:18 am on 07/07/2024 by text page via the [facility] messaging system. Electronically Signed   By: Morene Hoard M.D.   On: 07/07/2024 00:19    Review of Systems Blood pressure 126/65, pulse 61, temperature 99 F  (37.2 C), resp. rate 18, height 5' 9 (1.753 m), weight 73.9 kg, SpO2 100%. Physical Exam Constitutional:      Comments: Intubated and sedated  HENT:     Head: Normocephalic.     Nose: Nose normal.  Eyes:     Pupils: Pupils are equal, round, and reactive to light.  Cardiovascular:     Rate and Rhythm: Normal rate.  Pulmonary:     Comments: INtubated Abdominal:     General: Abdomen is flat.  Musculoskeletal:     Cervical back: Normal range of motion.  Neurological:     Comments: PERL Under deep sedation       Theran Vandergrift 07/08/2024, 10:36 AM

## 2024-07-08 NOTE — Progress Notes (Signed)
 PT Cancellation Note  Patient Details Name: Wayne Molina MRN: [id number] DOB: 1956-06-07   Cancelled Treatment:    Reason Eval/Treat Not Completed: Patient not medically ready per discussion with RN.    Macario RAMAN, PT Acute Rehabilitation Services  Office [phone]   Macario SHAUNNA Soja 07/08/2024, 3:22 PM

## 2024-07-08 NOTE — Progress Notes (Signed)
 PHARMACY - ANTICOAGULATION CONSULT NOTE  Pharmacy Consult for heparin  Indication: atrial fibrillation  Labs: Recent Labs    07/07/24 0003 07/07/24 0008 07/07/24 0602 07/07/24 1400 07/08/24 0523  HGB 15.6 15.6 15.5 12.9*  --   HCT 46.8 46.0 47.2 38.0*  --   PLT 250  --  224  --   --   APTT 36  --   --   --  72*  LABPROT 14.8  --   --   --   --   INR 1.1  --   --   --   --   HEPARINUNFRC  --   --   --   --  0.64  CREATININE 1.02 1.00 1.08  --   --    Assessment/Plan:  68yo male therapeutic on heparin  with initial dosing while DOAC on hold. Will continue infusion at current rate of 1100 units/hr and confirm stable with additional level.  Marvetta Dauphin, PharmD, BCPS 07/08/2024 6:29 AM

## 2024-07-08 NOTE — Progress Notes (Signed)
Patient returned safely from  CT.

## 2024-07-08 NOTE — Progress Notes (Signed)
 LTM maint complete - no skin breakdown under:  Fp1,Fp2

## 2024-07-08 NOTE — Plan of Care (Signed)
  Problem: Activity: Goal: Ability to tolerate increased activity will improve Outcome: Progressing   Problem: Respiratory: Goal: Ability to maintain a clear airway and adequate ventilation will improve Outcome: Progressing   Problem: Role Relationship: Goal: Method of communication will improve Outcome: Progressing   

## 2024-07-08 NOTE — Progress Notes (Signed)
   07/08/24 0003  Charting Type  Charting Type Full Reassessment Changes Noted  Focused Reassessment No Changes Neurological  Neurological  Neuro (WDL) X  Level of Consciousness Unresponsive  Orientation Level Intubated/Tracheostomy - Unable to assess  Cognition Unable to follow commands  Speech Intubated/Tracheostomy - Unable to assess  R Pupil Size (mm) 2  R Pupil Shape Round  R Pupil Reaction Sluggish  L Pupil Size (mm) 2  L Pupil Shape Round  L Pupil Reaction Sluggish  Motor Function/Sensation Assessment Motor response;Sensation  RUE Motor Response Spastic;Abnormal flexion;Tremors  LUE Motor Response Abnormal flexion;Spastic;Tremors  RLE Motor Response Tremors;Abnormal flexion;Spastic  LLE Motor Response Abnormal flexion;Spastic;Tremors  Respiratory  Respiratory Pattern Unlabored;Symmetrical  Chest Assessment Chest expansion symmetrical   Versed  administered per order for seizure like activity.

## 2024-07-08 NOTE — Consult Note (Signed)
 NAME:  Wayne Molina, MRN:  [id number], DOB:  10-Jun-1956, LOS: 1 ADMISSION DATE:  07/06/2024, CONSULTATION DATE:  07/07/24 REFERRING MD:  FPTS, CHIEF COMPLAINT:  AMS   History of Present Illness:  HPI obtained from EMR review and per wife at bedside as pt remains encephalopathic.   51 yoM with PMH as below significant for afib on Eliquis , HTN, HLD, and untreated OSA who presented to ER as code stroke after coworker noticed he was altered since 8/28 around 2230 and pale, pt reported feeling dizzy.  Wife reports pt has been in his normal state of health, just recently not sleeping well for the last week and fatigued.  Non smoker, wife denies ETOH use.  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  In ER, noted to have L sided weakness and left hemianopsia.  Last dose eliquis  reported 8/28.  Code stroke activated.  CTH initially concerning for right posterior MCA stroke but CTA negative for LVO.  Also noted to have rhythmic eye twitching, SBP 200s, and more lethargic, oriented to person only.  Repeat CTH concerning for mass.  EEG started, and loaded with keppra .  FPTS to admit.  Given total of 4mg  ativan  for MRI, seizure concern, and intermittent agitation.  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  NSGY consulted, plans for biopsy 8/30.  PCCM consulted for ongoing encephalopathy and concern for airway protection, remains on 2L, no reported hypoxia.   Pertinent  Medical History   Past Medical History:  Diagnosis Date   Anxiety    Arthritis    fingers   Chronic atrial fibrillation (HCC)    Depression    Family history of breast cancer    Family history of cancer of male genital organ    Family history of gene mutation    BRIP1   Family history of  malignant neoplasm of gastrointestinal tract    Hyperlipidemia    Hypertension    Personal history of colonic polyps 11/12/2006   Sleep apnea     Significant Hospital Events: Including procedures, antibiotic start and stop dates in addition to other pertinent events   8/28 admitted   Interim History / Subjective:   Possible seizure activity overnight when propofol  was weaned off due to bradycardia.  EEG report with no seizures.  Keppra  dose increased, one-time dose of valproate given.  Placed on midazolam  drip.  Will work on decreasing midazolam  drip and assess for ongoing seizure activity.  Ideally able to wean off and liberate from ventilator pending response of seizures etc.  Objective    Blood pressure 111/62, pulse (!) 51, temperature 99 F (37.2 C), temperature source Bladder, resp. rate 18, height 5' 9 (1.753 m), weight 73.9 kg, SpO2 100%.    Vent Mode: PRVC FiO2 (%):  [40 %-50 %] 40 % Set Rate:  [18 bmp] 18 bmp Vt Set:  [540 mL-560 mL] 560 mL PEEP:  [5 cmH20] 5 cmH20 Plateau Pressure:  [13 cmH20-14 cmH20] 14 cmH20   Intake/Output Summary (Last 24 hours) at 07/08/2024 0815 Last data filed at 07/08/2024 0800 Gross per 24 hour  Intake 780.9 ml  Output 1750 ml  Net -969.1 ml   Filed Weights   07/07/24 0039  Weight: 73.9 kg    Examination: General:  ill appearing older male lying in bed HEENT:  MM pink/moist, pupils 4/r, anicteric  Neuro: Deeply sedated CV:  irir, bradycardic, afib, no murmur PULM: Coarse ventilated sounds GI: soft, bs+ Extremities: warm/dry, no LE edema  Skin: no rashes   Resolved problem list   Assessment and Plan   Brain mass Encephalopathy R/o seizures  P:  - Appreciate Neurology and NSGY input - plans for biopsy with Dr. Rosslyn, allowing Eliquis  washout - ongoing LTM> no seizure activity thus far - AEDs per Neurology > keppra  dose increased 8/30, one-time dose of valproate given 8/30 a.m. - Versed  gtt., wean as tolerated  Acute  airway insufficiency related to above and s/p ativan   - s/p intubation for airway protection - cont full LTVV support, 4-8cc/kg IBW with goal Pplat <30 and DP<15  - VAP prevention protocol/ PPI - PAD protocol for sedation> Versed , fentanyl  as needed - wean FiO2 as able for SpO2 >92%  - daily SAT & SBT when appropriate   Afib on eliquis  - last dose 8/28 P:  - tele monitoring, remains rate controlled - optimize electrolytes - cont to hold eliquis  - Heparin  gtt for stroke prophylaxis  HTN HLD - hold home losartan  for now with sedation - goal MAP > 65   Hyperglycemia - previous A1c normal in May, like reactive - A1c 6.0 - CBG q4, prn SSI if > 180   Wife, [name] [phone], updated at bedside and plan of care.   Labs   CBC: Recent Labs  Lab 07/07/24 0003 07/07/24 0008 07/07/24 0602 07/07/24 1400 07/08/24 0732  WBC 8.2  --  13.4*  --  12.0*  NEUTROABS 3.9  --   --   --   --   HGB 15.6 15.6 15.5 12.9* 15.4  HCT 46.8 46.0 47.2 38.0* 48.2  MCV 92.9  --  94.2  --  97.2  PLT 250  --  224  --  171    Basic Metabolic Panel: Recent Labs  Lab 07/07/24 0003 07/07/24 0008 07/07/24 0602 07/07/24 1400 07/07/24 1446  NA 137 138 136 137  --   K 4.4 4.2 5.1 4.3  --   CL 102 102 103  --   --   CO2 25  --  23  --   --   GLUCOSE 125* 117* 170*  --   --   BUN 21 24* 20  --   --   CREATININE 1.02 1.00 1.08  --   --   CALCIUM  9.7  --  9.0  --   --   MG  --   --   --   --  2.0   GFR: Estimated Creatinine Clearance: 63.6 mL/min (by C-G formula based on SCr of 1.08 mg/dL). Recent Labs  Lab 07/07/24 0003 07/07/24 0602 07/08/24 0732  WBC 8.2 13.4* 12.0*    Liver Function Tests: Recent Labs  Lab 07/07/24 0003  AST 32  ALT 24  ALKPHOS 53  BILITOT 1.7*  PROT 7.3  ALBUMIN 4.1   No results for input(s): LIPASE, AMYLASE in the last 168 hours. No results for input(s): AMMONIA in the last 168 hours.  ABG    Component Value Date/Time   PHART 7.345 (L)  07/07/2024 1400   PCO2ART 41.8 07/07/2024 1400   PO2ART 411 (H) 07/07/2024 1400   HCO3 22.7 07/07/2024 1400   TCO2 24 07/07/2024 1400   ACIDBASEDEF 3.0 (H) 07/07/2024 1400   O2SAT 100 07/07/2024 1400     Coagulation Profile: Recent Labs  Lab 07/07/24 0003  INR 1.1  Cardiac Enzymes: No results for input(s): CKTOTAL, CKMB, CKMBINDEX, TROPONINI in the last 168 hours.  HbA1C: Hemoglobin A1C  Date/Time Value Ref Range Status  03/07/2024 09:15 AM 5.6 4.0 - 5.6 % Final   Hgb A1c MFr Bld  Date/Time Value Ref Range Status  07/07/2024 06:02 AM 6.0 (H) 4.8 - 5.6 % Final    Comment:    (NOTE)         Prediabetes: 5.7 - 6.4         Diabetes: >6.4         Glycemic control for adults with diabetes: <7.0     CBG: Recent Labs  Lab 07/07/24 0116 07/07/24 1953 07/07/24 2353 07/08/24 0406 07/08/24 0756  GLUCAP 139* 91 76 92 83    Review of Systems:   Unable   Past Medical History:  He,  has a past medical history of Anxiety, Arthritis, Chronic atrial fibrillation (HCC), Depression, Family history of breast cancer, Family history of cancer of male genital organ, Family history of gene mutation, Family history of malignant neoplasm of gastrointestinal tract, Hyperlipidemia, Hypertension, Personal history of colonic polyps (11/12/2006), and Sleep apnea.   Surgical History:   Past Surgical History:  Procedure Laterality Date   2-D echocardiogram  07/22/2010   Ejection fraction greater than 55%. Left atrium moderately dilated. Right atrium moderately dilated. 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 Mild to Moderate MR. Mild to moderate TR.   ATRIAL ABLATION SURGERY  2004   EP IMPLANTABLE DEVICE N/A 02/11/2016   Procedure: Loop Recorder Insertion;  Surgeon: Jerel Balding, MD;  Location: [facility] INVASIVE [facility];  Service: Cardiovascular;  Laterality: N/A;   Exercise Myoview stress test  07/08/2000   Nonischemic low-risk.   HIP FRACTURE SURGERY  1970s   IR GENERIC HISTORICAL  01/21/2017    IR RADIOLOGIST EVAL & MGMT 01/21/2017 [facility]INTER[facility] RAD     Social History:   reports that he has quit smoking. His smoking use included cigarettes. He has never used smokeless tobacco. He reports that he does not drink alcohol and does not use drugs.   Family History:  His family history includes Breast cancer (age of onset: 43) in his niece; Cancer in his paternal aunt and paternal great-grandmother; Cancer (age of onset: 49) in his cousin; Cancer (age of onset: 21) in his niece; Colon cancer in his maternal uncle; Colon cancer (age of onset: 52) in his nephew; Dementia in his mother; Diabetes in his father; Heart disease in his father; Kidney disease in his brother; Lung cancer in his paternal aunt; Other in his niece and sister.   Allergies No Known Allergies   Home Medications  Prior to Admission medications   Medication Sig Start Date End Date Taking? Authorizing Provider  apixaban  (ELIQUIS ) 5 MG TABS tablet Take 1 tablet (5 mg total) by mouth 2 (two) times daily. 03/27/24  Yes Croitoru, Mihai, MD  atorvastatin  (LIPITOR) 40 MG tablet Take 1 tablet (40 mg total) by mouth daily. 06/29/24  Yes Janna Ferrier, DO  calcium  carbonate (OSCAL) 1500 (600 Ca) MG TABS tablet Take by mouth 2 (two) times daily with a meal.   Yes [provider]  Cyanocobalamin (VITAMIN B-12 PO) Take 1 capsule by mouth daily.   Yes [provider]  losartan  (COZAAR ) 100 MG tablet Take 1 tablet (100 mg total) by mouth daily. 05/10/24  Yes Gomes, Adriana, DO  Magnesium Oxide 250 MG TABS Take 1 tablet by mouth daily in the afternoon. 02/07/18  Yes [provider]  Multiple  Vitamin (MULTIVITAMIN ADULT PO) Take 1 tablet by mouth daily. 02/07/18  Yes [provider]  Potassium Gluconate 2 MEQ TABS Take 1 tablet by mouth daily in the afternoon. 02/07/18  Yes [provider]  sildenafil  (VIAGRA ) 50 MG tablet Take 2 tablets (100 mg total) by mouth daily as needed. 06/14/23  Yes Chambliss,  Layman CROME, MD  cyclobenzaprine  (FLEXERIL ) 5 MG tablet Take 1 tablet (5 mg total) by mouth 2 (two) times daily as needed for muscle spasms. Patient not taking: Reported on 07/07/2024 06/17/24   Mayer, Jodi R, NP  lidocaine  (LIDODERM ) 5 % Place 1 patch onto the skin daily. Apply to affected area on neck/shoulder and keep in place for 12 hours and remove for 12 hours Patient not taking: Reported on 07/07/2024 06/17/24   Loreda Myla SAUNDERS, NP     Critical care time:     CRITICAL CARE Performed by: Donnice SAUNDERS Beals   Total critical care time: 32 minutes  Critical care time was exclusive of separately billable procedures and treating other patients.  Critical care was necessary to treat or prevent imminent or life-threatening deterioration.  Critical care was time spent personally by me on the following activities: development of treatment plan with patient and/or surrogate as well as nursing, discussions with consultants, evaluation of patient's response to treatment, examination of patient, obtaining history from patient or surrogate, ordering and performing treatments and interventions, ordering and review of laboratory studies, ordering and review of radiographic studies, pulse oximetry and re-evaluation of patient's condition.   Donnice SAUNDERS Beals, MD [facility] 07/08/2024, 8:15 AM  See Amion If no response to pager , please call [phone] until 7pm After 7:00 pm call Elink  [phone]

## 2024-07-08 NOTE — Progress Notes (Signed)
   07/08/24 0111  Charting Type  Charting Type Full Reassessment Changes Noted  Focused Reassessment No Changes Neurological  Neurological  Neuro (WDL) X  Orientation Level Intubated/Tracheostomy - Unable to assess  Cognition Unable to follow commands  Speech Intubated/Tracheostomy - Unable to assess  R Pupil Size (mm) 2  R Pupil Shape Round  R Pupil Reaction Sluggish  L Pupil Size (mm) 2  L Pupil Shape Round  L Pupil Reaction Sluggish  RUE Motor Response Spastic;Abnormal flexion;Tremors  LUE Motor Response Abnormal flexion;Spastic;Tremors  RLE Motor Response Tremors;Abnormal flexion;Spastic  LLE Motor Response Abnormal flexion;Spastic;Tremors  ECG Monitoring  ECG Heart Rate 69  Neurological  Level of Consciousness Unresponsive     Neurosurgery paged regarding change in seizure frequency/occurrence. Per Neurology, a CT head/CTA should be ordered with any focal neurological deficit.

## 2024-07-08 NOTE — Progress Notes (Signed)
 Update given to Dr. Janjua. See new orders from Neurology.

## 2024-07-08 NOTE — Progress Notes (Signed)
 Subjective: Patient required intubation and sedation overnight in the setting of acute hypoxic respiratory failure. Biopsy has been deferred.   ROS: Unable to obtain due to poor mental status  Examination  Vital signs in last 24 hours: Temp:  [99 F (37.2 C)-100.4 F (38 C)] 100.2 F (37.9 C) (08/30 1800) Pulse Rate:  [49-83] 73 (08/30 1800) Resp:  [16-26] 18 (08/30 1800) BP: (107-185)/(57-95) 123/58 (08/30 1800) SpO2:  [79 %-100 %] 100 % (08/30 1800) FiO2 (%):  [40 %] 40 % (08/30 1422)  General: lying in bed, NAD Neuro: I examined the patient after he had received lorazepam .  Therefore he did not open eyes to repeated tactile and verbal stimulation, on passive eye opening, pupils were miotic so difficult to appreciate any reactivity, no forced gaze deviation was noted, he did not follow any commands or answer any questions, did have antigravity strength in all 4 extremities   Basic Metabolic Panel: Recent Labs  Lab 07/07/24 0003 07/07/24 0008 07/07/24 0602 07/07/24 1400 07/07/24 1446  NA 137 138 136 137  --   K 4.4 4.2 5.1 4.3  --   CL 102 102 103  --   --   CO2 25  --  23  --   --   GLUCOSE 125* 117* 170*  --   --   BUN 21 24* 20  --   --   CREATININE 1.02 1.00 1.08  --   --   CALCIUM  9.7  --  9.0  --   --   MG  --   --   --   --  2.0    CBC: Recent Labs  Lab 07/07/24 0003 07/07/24 0008 07/07/24 0602 07/07/24 1400 07/08/24 0732  WBC 8.2  --  13.4*  --  12.0*  NEUTROABS 3.9  --   --   --   --   HGB 15.6 15.6 15.5 12.9* 15.4  HCT 46.8 46.0 47.2 38.0* 48.2  MCV 92.9  --  94.2  --  97.2  PLT 250  --  224  --  171     Coagulation Studies: Recent Labs    07/07/24 0003  LABPROT 14.8  INR 1.1    Imaging personally reviewed  CT head without contrast 07/07/2024:  Now evident is an enhancing intra-axial mass involving the posterior right frontotemporal region, in the region of previously suspected right MCA infarct. The enhancing mass is bilobed, with  dominant components measuring 2.9 cm and 3.7 cm at the right temporal lobe and splenium respectively. Surrounding vasogenic edema without midline shift. 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 Further evaluation with dedicated MRI,with and without contrast, recommended.  CTA head and neck with and without contrast 07/07/2024: Negative CTA for acute large vessel occlusion or other emergentfinding. 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 Atheromatous change about the left carotid bulb with estimated 50% stenosis by NASCET criteria.  MRI brain with and without contrast 07/07/2024:Positive for relatively large and infiltrative tumor with heterogeneous enhancement tracking from the splenium of the corpus callosum near midline into the right temporal lobe. Confluent regional tumoral edema. No midline shift or significant intracranial mass effect. Top differential considerations are Glioblastoma and CNS Lymphoma. No other acute intracranial abnormality, with underlying small chronic cerebellar infarcts.    ASSESSMENT AND PLAN:  68 y.o. male with hx of  Htn, HLD, pAfibb on eliquis , OSA who presents to the ED with acute onset L arm weakness and L hemianopsia.   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  Therefore continue Keppra  500 mg twice daily for now - If patient continues to be agitated, okay to schedule Valium  2.5 mg twice daily as it will help with agitation as well as seizures - Please hold home Eliquis  because of biopsy tomorrow.  Of note this does increase his risk of stroke.  Therefore any acute change in mental status or focal neurological deficit will need stat CT head and CTA head and  neck  ADDENDUM - Patient took eliquis  yesterday so will need to postpone biopsy. Will put on non bolus heparin  drip protocol in the interim for A fib. Discussed with Dr Rosemarie, Dr Rosslyn and Dr [facility]   CRITICAL CARE Performed by: Elida CHRISTELLA Ross   Total critical care time: 35  minutes  Critical care time was exclusive of separately billable procedures and treating other patients.  Critical care was necessary to treat or prevent imminent or life-threatening deterioration.  Critical care was time spent personally by me on the following activities: development of treatment plan with patient and/or surrogate as well as nursing, discussions with consultants, evaluation of patient's response to treatment, examination of patient, obtaining history from patient or surrogate, ordering and performing treatments and interventions, ordering and review of laboratory studies, ordering and review of radiographic studies, pulse oximetry and re-evaluation of patient's condition.   Arlin Krebs Epilepsy [facility] For questions after 5pm please refer to AMION to reach the Neurologist on call

## 2024-07-08 NOTE — Progress Notes (Addendum)
 Was notified of concern for several seizures. LTM reviewed, does have R epileptiform discharges. Concern for possible focal seizure but obscured by musclae artifact. Hard to say. No obvious seizures.  CT head obtained at the request of Dr. Rosslyn, no ICH.  Propofol  seemed to have been weaned off for bradycardia. Will given Versed  4mg  and start Versed  10mg /hr. Will increase Keppra  to 1500 BID and give a 1 time dose of Valproic  acid.  Wayne Molina [facility]

## 2024-07-08 NOTE — Evaluation (Addendum)
 SLP Cancellation Note  Patient Details Name: ZEIN HELBING MRN: [id number] DOB: Dec 27, 1955   Cancelled treatment:       Reason Eval/Treat Not Completed: Other (comment);Fatigue/lethargy limiting ability to participate;Medical issues which prohibited therapy (pt remains lethargic, on vent, and with possible new seizure- EEG shows high risk for recurrent seizures. Will continue efforts)  Madelin POUR, MS [facility] SLP Acute Rehab Services Office [phone]   Nicolas Emmie Caldron 07/08/2024, 7:17 AM

## 2024-07-08 NOTE — Progress Notes (Signed)
 This RN was titrating Versed  down per plan of care today to assess for extubation readiness and to note if seizures(?) were controlled. Around 1500 while assessing patient and speaking to family at bedside, this RN noted right shoulder rhythmic movement, with the patients' head also leaning to the right. No noted gaze preference during the episode, but RN hit EEG monitoring button to capture the possible event. This RN then administered 2mg  Versed  bolus. Event resolved after bolus. Dr. Annella notified of the event, and verbal orders to titrate Versed  to 4mg /hr and to maintain..  Before seizure-like activity (while on 3mg  of Versed ), patient only would withdrawal to pain on lower extremities, and localize on the upper extremities. However, did not follow commands when asked.  Ajayla Iglesias, RN

## 2024-07-08 NOTE — Progress Notes (Signed)
 eLink Physician-Brief Progress Note Patient Name: Wayne Molina DOB: Dec 16, 1955 MRN: [id number]   Date of Service  07/08/2024  HPI/Events of Note  Patient having bradycardia down to the 30's with Propofol  gtt increase for sub-optimal sedation.  eICU Interventions  Low dose Fentanyl  gtt ordered as a Propofol  dose sparing measure.        [name] U [name] 07/08/2024, 12:54 AM

## 2024-07-08 NOTE — Procedures (Signed)
 Patient Name: Wayne Molina  MRN: [id number]  Epilepsy Attending: Arlin MALVA Krebs  Referring Physician/Provider: Khaliqdina, Salman, MD  Duration: 07/07/2024 9070 to 830/2025 0615  Patient history: 68 y.o. 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 EEG to evaluate for seizure  Level of alertness: comatose/ lethargic   AEDs during EEG study: LEV, propofol , Versed ,VPA  Technical aspects: This EEG study was done with scalp electrodes positioned according to the 10-20 International system of electrode placement. Electrical activity was reviewed with band pass filter of 1-70Hz , sensitivity of 7 uV/mm, display speed of 78mm/sec with a 60Hz  notched filter applied as appropriate. EEG data were recorded continuously and digitally stored.  Video monitoring was available and reviewed as appropriate.  Description: EEG showed continuous generalized and lateralized right hemisphere 3 to 6 Hz theta-delta slowing admixed with 13-15hz  beta activity.  At the beginning of the study, EEG also showed lateralized periodic discharges ( LPDs) with overriding fast activity at 1-1.5Hz  in right hemisphere. As sedation was adjusted, overriding fast activity improved  Hyperventilation and photic stimulation were not performed.     ABNORMALITY - Lateralized periodic discharges with overriding fast activity ( LPD +F)  right hemisphere - Continuous slow, generalized and lateralized right hemisphere  IMPRESSION: This study showed evidence of epileptogenicity and cortical dysfunction arising from right hemisphere likely due to underlying structural abnormality. This eeg pattern is on the ictal-interictal continuum with high risk of seizure recurrence.  Additionally there is severe diffuse encephalopathy, likely related to sedation. No seizures were seen throughout the recording.  Shanica Castellanos O Haruko Mersch

## 2024-07-08 NOTE — Progress Notes (Signed)
 eLink Physician-Brief Progress Note Patient Name: DONATHAN BULLER DOB: 10/09/1956 MRN: [id number]   Date of Service  07/08/2024  HPI/Events of Note  CBG 76 mg / dl, patient is NPO, NGT to LIS, receiving no calories.  eICU Interventions  D5 % NS gtt ordered at 50 ml / hour.        Antonia Culbertson U Hendrixx Severin 07/08/2024, 12:34 AM

## 2024-07-08 NOTE — Progress Notes (Signed)
   07/08/24 0041  Vitals  Temp 99.7 F (37.6 C)  ECG Heart Rate (!) 45  Resp 18  MEWS Score  MEWS Temp 0  MEWS Systolic 0  MEWS Pulse 1  MEWS RR 0  MEWS LOC 3  MEWS Score 4  MEWS Score Color Red     E-Link informed.

## 2024-07-08 NOTE — Progress Notes (Signed)
 PHARMACY - ANTICOAGULATION CONSULT NOTE  Pharmacy Consult for Heparin  Indication: atrial fibrillation  No Known Allergies  Patient Measurements: Height: 5' 9 (175.3 cm) Weight: 73.9 kg (163 lb) IBW/kg (Calculated) : 70.7 HEPARIN  DW (KG): 73.9  Vital Signs: Temp: 100.4 F (38 C) (08/30 1600) Temp Source: Bladder (08/30 1600) BP: 122/73 (08/30 1600) Pulse Rate: 77 (08/30 1600)  Labs: Recent Labs    07/07/24 0003 07/07/24 0008 07/07/24 0602 07/07/24 1400 07/08/24 0523 07/08/24 0732 07/08/24 1405  HGB 15.6 15.6 15.5 12.9*  --  15.4  --   HCT 46.8 46.0 47.2 38.0*  --  48.2  --   PLT 250  --  224  --   --  171  --   APTT 36  --   --   --  72*  --  77*  LABPROT 14.8  --   --   --   --   --   --   INR 1.1  --   --   --   --   --   --   HEPARINUNFRC  --   --   --   --  0.64  --  0.58  CREATININE 1.02 1.00 1.08  --   --   --   --     Estimated Creatinine Clearance: 63.6 mL/min (by C-G formula based on SCr of 1.08 mg/dL).   Assessment: 68 y.o. M on Eliquis  pta for afib. Holding for procedures - brain biopsy of tumor in next 1-2 days when stable. Last dose 8/28 0830. To begin heparin . Eliquis  will be affecting heparin  levels so will utilize aPTT for monitoring until levels correlate. CBC stable.  Heparin  level 0.58 (still appears affected by Eliquis ), aPTT 77 sec (therapeutic) on infusion at 1100 units/hr. No bleeding noted.  Goal of Therapy:  Heparin  level 0.3-0.7 units/ml aPTT 66-102 seconds Monitor platelets by anticoagulation protocol: Yes   Plan:  Continue heparin  gtt at 1100 units/hr. No bolus. Daily heparin  level, aPTT, and CBC  Vito Ralph, PharmD, BCPS Please see amion for complete clinical pharmacist phone list 07/08/2024,4:49 PM

## 2024-07-09 ENCOUNTER — Inpatient Hospital Stay (HOSPITAL_COMMUNITY)

## 2024-07-09 DIAGNOSIS — G40901 Epilepsy, unspecified, not intractable, with status epilepticus: Secondary | ICD-10-CM

## 2024-07-09 DIAGNOSIS — R569 Unspecified convulsions: Secondary | ICD-10-CM | POA: Diagnosis not present

## 2024-07-09 DIAGNOSIS — D497 Neoplasm of unspecified behavior of endocrine glands and other parts of nervous system: Secondary | ICD-10-CM | POA: Diagnosis not present

## 2024-07-09 DIAGNOSIS — G9389 Other specified disorders of brain: Secondary | ICD-10-CM | POA: Diagnosis not present

## 2024-07-09 DIAGNOSIS — I4891 Unspecified atrial fibrillation: Secondary | ICD-10-CM | POA: Diagnosis not present

## 2024-07-09 DIAGNOSIS — G9340 Encephalopathy, unspecified: Secondary | ICD-10-CM | POA: Diagnosis not present

## 2024-07-09 DIAGNOSIS — R739 Hyperglycemia, unspecified: Secondary | ICD-10-CM | POA: Diagnosis not present

## 2024-07-09 LAB — COMPREHENSIVE METABOLIC PANEL WITH GFR
ALT: 39 U/L (ref 0–44)
AST: 49 U/L — ABNORMAL HIGH (ref 15–41)
Albumin: 3.1 g/dL — ABNORMAL LOW (ref 3.5–5.0)
Alkaline Phosphatase: 50 U/L (ref 38–126)
Anion gap: 11 (ref 5–15)
BUN: 16 mg/dL (ref 8–23)
CO2: 18 mmol/L — ABNORMAL LOW (ref 22–32)
Calcium: 8.5 mg/dL — ABNORMAL LOW (ref 8.9–10.3)
Chloride: 107 mmol/L (ref 98–111)
Creatinine, Ser: 0.8 mg/dL (ref 0.61–1.24)
GFR, Estimated: >60 mL/min (ref >60)
Glucose, Bld: 119 mg/dL — ABNORMAL HIGH (ref 70–99)
Potassium: 4 mmol/L (ref 3.5–5.1)
Sodium: 136 mmol/L (ref 135–145)
Total Bilirubin: 2.1 mg/dL — ABNORMAL HIGH (ref 0.0–1.2)
Total Protein: 6.2 g/dL — ABNORMAL LOW (ref 6.5–8.1)

## 2024-07-09 LAB — CBC
HCT: 43.7 % (ref 39.0–52.0)
Hemoglobin: 14.7 g/dL (ref 13.0–17.0)
MCH: 31.1 pg (ref 26.0–34.0)
MCHC: 33.6 g/dL (ref 30.0–36.0)
MCV: 92.6 fL (ref 80.0–100.0)
Platelets: 216 K/uL (ref 150–400)
RBC: 4.72 MIL/uL (ref 4.22–5.81)
RDW: 12.6 % (ref 11.5–15.5)
WBC: 11.7 K/uL — ABNORMAL HIGH (ref 4.0–10.5)
nRBC: 0 % (ref 0.0–0.2)

## 2024-07-09 LAB — GLUCOSE, CAPILLARY
Glucose-Capillary: 108 mg/dL — ABNORMAL HIGH (ref 70–99)
Glucose-Capillary: 117 mg/dL — ABNORMAL HIGH (ref 70–99)
Glucose-Capillary: 119 mg/dL — ABNORMAL HIGH (ref 70–99)
Glucose-Capillary: 131 mg/dL — ABNORMAL HIGH (ref 70–99)
Glucose-Capillary: 151 mg/dL — ABNORMAL HIGH (ref 70–99)
Glucose-Capillary: 79 mg/dL (ref 70–99)

## 2024-07-09 LAB — MAGNESIUM: Magnesium: 2.1 mg/dL (ref 1.7–2.4)

## 2024-07-09 LAB — PHOSPHORUS: Phosphorus: 2.5 mg/dL (ref 2.5–4.6)

## 2024-07-09 LAB — APTT: aPTT: 82 s — ABNORMAL HIGH (ref 24–36)

## 2024-07-09 LAB — HEPARIN LEVEL (UNFRACTIONATED): Heparin Unfractionated: 0.47 [IU]/mL (ref 0.30–0.70)

## 2024-07-09 MED ORDER — OSMOLITE 1.5 CAL PO LIQD
1000.0000 mL | ORAL | Status: DC
  Administered 2024-07-09 – 2024-07-21 (×10): 1000 mL
  Filled 2024-07-09: qty 1000

## 2024-07-09 MED ORDER — INSULIN ASPART 100 UNIT/ML IJ SOLN
0.0000 [IU] | INTRAMUSCULAR | Status: DC
  Administered 2024-07-09: 2 [IU] via SUBCUTANEOUS
  Administered 2024-07-10 (×2): 1 [IU] via SUBCUTANEOUS
  Administered 2024-07-10: 3 [IU] via SUBCUTANEOUS
  Administered 2024-07-10: 2 [IU] via SUBCUTANEOUS
  Administered 2024-07-10 – 2024-07-11 (×3): 1 [IU] via SUBCUTANEOUS
  Administered 2024-07-11 (×3): 2 [IU] via SUBCUTANEOUS
  Administered 2024-07-11: 3 [IU] via SUBCUTANEOUS
  Administered 2024-07-11: 2 [IU] via SUBCUTANEOUS
  Administered 2024-07-12 (×2): 3 [IU] via SUBCUTANEOUS
  Administered 2024-07-12 – 2024-07-13 (×5): 2 [IU] via SUBCUTANEOUS
  Administered 2024-07-13: 1 [IU] via SUBCUTANEOUS
  Administered 2024-07-13: 2 [IU] via SUBCUTANEOUS
  Administered 2024-07-13 – 2024-07-14 (×2): 1 [IU] via SUBCUTANEOUS
  Administered 2024-07-14: 2 [IU] via SUBCUTANEOUS
  Administered 2024-07-14: 1 [IU] via SUBCUTANEOUS
  Administered 2024-07-14: 2 [IU] via SUBCUTANEOUS
  Administered 2024-07-14 – 2024-07-16 (×8): 1 [IU] via SUBCUTANEOUS
  Administered 2024-07-16: 2 [IU] via SUBCUTANEOUS
  Administered 2024-07-16 (×3): 1 [IU] via SUBCUTANEOUS
  Administered 2024-07-16 – 2024-07-17 (×2): 2 [IU] via SUBCUTANEOUS
  Administered 2024-07-17: 3 [IU] via SUBCUTANEOUS
  Administered 2024-07-18: 2 [IU] via SUBCUTANEOUS
  Administered 2024-07-18: 3 [IU] via SUBCUTANEOUS

## 2024-07-09 MED ORDER — PROSOURCE TF20 ENFIT COMPATIBL EN LIQD
60.0000 mL | Freq: Every day | ENTERAL | Status: DC
  Administered 2024-07-09 – 2024-07-24 (×15): 60 mL
  Filled 2024-07-09 (×15): qty 60

## 2024-07-09 MED ORDER — SODIUM CHLORIDE 0.9 % IV SOLN: INTRAVENOUS | Status: AC | PRN

## 2024-07-09 MED ORDER — PIPERACILLIN-TAZOBACTAM 3.375 G IVPB
3.3750 g | Freq: Three times a day (TID) | INTRAVENOUS | Status: DC
  Administered 2024-07-09 – 2024-07-10 (×3): 3.375 g via INTRAVENOUS
  Filled 2024-07-09 (×3): qty 50

## 2024-07-09 NOTE — Consult Note (Signed)
 NAME:  Wayne Molina, MRN:  [id number], DOB:  1956/02/25, LOS: 2 ADMISSION DATE:  07/06/2024, CONSULTATION DATE:  07/07/24 REFERRING MD:  FPTS, CHIEF COMPLAINT:  AMS   History of Present Illness:  HPI obtained from EMR review and per wife at bedside as pt remains encephalopathic.   30 yoM with PMH as below significant for afib on Eliquis , HTN, HLD, and untreated OSA who presented to ER as code stroke after coworker noticed he was altered since 8/28 around 2230 and pale, pt reported feeling dizzy.  Wife reports pt has been in his normal state of health, just recently not sleeping well for the last week and fatigued.  Non smoker, wife denies ETOH use.  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  In ER, noted to have L sided weakness and left hemianopsia.  Last dose eliquis  reported 8/28.  Code stroke activated.  CTH initially concerning for right posterior MCA stroke but CTA negative for LVO.  Also noted to have rhythmic eye twitching, SBP 200s, and more lethargic, oriented to person only.  Repeat CTH concerning for mass.  EEG started, and loaded with keppra .  FPTS to admit.  Given total of 4mg  ativan  for MRI, seizure concern, and intermittent agitation.  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  NSGY consulted, plans for biopsy 8/30.  PCCM consulted for ongoing encephalopathy and concern for airway protection, remains on 2L, no reported hypoxia.   Pertinent  Medical History   Past Medical History:  Diagnosis Date   Anxiety    Arthritis    fingers   Chronic atrial fibrillation (HCC)    Depression    Family history of breast cancer    Family history of cancer of male genital organ    Family history of gene mutation    BRIP1   Family history of  malignant neoplasm of gastrointestinal tract    Hyperlipidemia    Hypertension    Personal history of colonic polyps 11/12/2006   Sleep apnea     Significant Hospital Events: Including procedures, antibiotic start and stop dates in addition to other pertinent events   8/28 admitted   Interim History / Subjective:   1-2 different episodes of jerking concerning for seizures.  None seen on EEG.  Midazolam  titration down was stopped.  Will work on continuing down titration given no obvious seizures on the EEG.  Continue Keppra .  Starting tube feeds  Objective    Blood pressure 136/68, pulse 69, temperature 100 F (37.8 C), temperature source Bladder, resp. rate 18, height 5' 9 (1.753 m), weight 79.5 kg, SpO2 100%.    Vent Mode: PRVC FiO2 (%):  [40 %] 40 % Set Rate:  [18 bmp] 18 bmp Vt Set:  [560 mL] 560 mL PEEP:  [5 cmH20] 5 cmH20 Plateau Pressure:  [13 cmH20-14 cmH20] 14 cmH20   Intake/Output Summary (Last 24 hours) at 07/09/2024 0923 Last data filed at 07/09/2024 0800 Gross per 24 hour  Intake 1580.99 ml  Output 630.1 ml  Net 950.89 ml   Filed Weights   07/07/24 0039 07/09/24 0411  Weight: 73.9 kg 79.5 kg    Examination: General:  ill appearing older male lying in bed HEENT: MM pink/moist,  Neuro: Deeply sedated CV:  irir, bradycardic, afib, no murmur PULM: Coarse ventilated sounds GI: soft,  bs+ Extremities: warm/dry, no LE edema  Skin: no rashes   Resolved problem list   Assessment and Plan   Brain mass Encephalopathy R/o seizures  P:  - Appreciate Neurology and NSGY input - plans for biopsy with Dr. Janjua once extubated, timing to be determined - ongoing LTM> no seizure activity thus far - AEDs per Neurology > keppra  dose increased 8/30, one-time dose of valproate given 8/30 a.m. - Versed  gtt., wean as tolerated  Acute airway insufficiency related to above and s/p ativan   - s/p intubation for airway protection - cont full LTVV support, 4-8cc/kg IBW with  goal Pplat <30 and DP<15  - VAP prevention protocol/ PPI - PAD protocol for sedation> Versed , fentanyl  as needed - wean FiO2 as able for SpO2 >92%  - daily SAT & SBT when appropriate   Afib on eliquis  - last dose 8/28 P:  - tele monitoring, remains rate controlled - optimize electrolytes - cont to hold eliquis  until biopsy plans are finalized - Heparin  gtt for stroke prophylaxis  HTN HLD - hold home losartan  for now with sedation - goal MAP > 65   Hyperglycemia - A1c 6.0 - Start tube feeds 8/31, CBG q4, prn SSI if > 180   Wife, [name] [phone], updated at bedside and plan of care.   Labs   CBC: Recent Labs  Lab 07/07/24 0003 07/07/24 0008 07/07/24 0602 07/07/24 1400 07/08/24 0732 07/09/24 0414  WBC 8.2  --  13.4*  --  12.0* 11.7*  NEUTROABS 3.9  --   --   --   --   --   HGB 15.6 15.6 15.5 12.9* 15.4 14.7  HCT 46.8 46.0 47.2 38.0* 48.2 43.7  MCV 92.9  --  94.2  --  97.2 92.6  PLT 250  --  224  --  171 216    Basic Metabolic Panel: Recent Labs  Lab 07/07/24 0003 07/07/24 0008 07/07/24 0602 07/07/24 1400 07/07/24 1446 07/09/24 0414  NA 137 138 136 137  --  136  K 4.4 4.2 5.1 4.3  --  4.0  CL 102 102 103  --   --  107  CO2 25  --  23  --   --  18*  GLUCOSE 125* 117* 170*  --   --  119*  BUN 21 24* 20  --   --  16  CREATININE 1.02 1.00 1.08  --   --  0.80  CALCIUM  9.7  --  9.0  --   --  8.5*  MG  --   --   --   --  2.0  --    GFR: Estimated Creatinine Clearance: 85.9 mL/min (by C-G formula based on SCr of 0.8 mg/dL). Recent Labs  Lab 07/07/24 0003 07/07/24 0602 07/08/24 0732 07/09/24 0414  WBC 8.2 13.4* 12.0* 11.7*    Liver Function Tests: Recent Labs  Lab 07/07/24 0003 07/09/24 0414  AST 32 49*  ALT 24 39  ALKPHOS 53 50  BILITOT 1.7* 2.1*  PROT 7.3 6.2*  ALBUMIN 4.1 3.1*   No results for input(s): LIPASE, AMYLASE in the last 168 hours. No results for input(s): AMMONIA in the last 168 hours.  ABG    Component Value  Date/Time   PHART 7.345 (L) 07/07/2024 1400   PCO2ART 41.8 07/07/2024 1400   PO2ART 411 (H) 07/07/2024 1400   HCO3 22.7 07/07/2024 1400   TCO2 24 07/07/2024 1400   ACIDBASEDEF 3.0 (H) 07/07/2024 1400   O2SAT 100 07/07/2024  1400     Coagulation Profile: Recent Labs  Lab 07/07/24 0003  INR 1.1    Cardiac Enzymes: No results for input(s): CKTOTAL, CKMB, CKMBINDEX, TROPONINI in the last 168 hours.  HbA1C: Hemoglobin A1C  Date/Time Value Ref Range Status  03/07/2024 09:15 AM 5.6 4.0 - 5.6 % Final   Hgb A1c MFr Bld  Date/Time Value Ref Range Status  07/07/2024 06:02 AM 6.0 (H) 4.8 - 5.6 % Final    Comment:    (NOTE)         Prediabetes: 5.7 - 6.4         Diabetes: >6.4         Glycemic control for adults with diabetes: <7.0     CBG: Recent Labs  Lab 07/08/24 1618 07/08/24 2009 07/08/24 2340 07/09/24 0334 07/09/24 0803  GLUCAP 97 91 82 119* 79    Review of Systems:   Unable   Past Medical History:  He,  has a past medical history of Anxiety, Arthritis, Chronic atrial fibrillation (HCC), Depression, Family history of breast cancer, Family history of cancer of male genital organ, Family history of gene mutation, Family history of malignant neoplasm of gastrointestinal tract, Hyperlipidemia, Hypertension, Personal history of colonic polyps (11/12/2006), and Sleep apnea.   Surgical History:   Past Surgical History:  Procedure Laterality Date   2-D echocardiogram  07/22/2010   Ejection fraction greater than 55%. Left atrium moderately dilated. Right atrium moderately dilated. 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 Mild to Moderate MR. Mild to moderate TR.   ATRIAL ABLATION SURGERY  2004   EP IMPLANTABLE DEVICE N/A 02/11/2016   Procedure: Loop Recorder Insertion;  Surgeon: Jerel Balding, MD;  Location: [facility] INVASIVE CV [facility];  Service: Cardiovascular;  Laterality: N/A;   Exercise Myoview stress test  07/08/2000   Nonischemic low-risk.   HIP FRACTURE SURGERY  1970s   IR  GENERIC HISTORICAL  01/21/2017   IR RADIOLOGIST EVAL & MGMT 01/21/2017 [facility]INTER[facility] RAD     Social History:   reports that he has quit smoking. His smoking use included cigarettes. He has never used smokeless tobacco. He reports that he does not drink alcohol and does not use drugs.   Family History:  His family history includes Breast cancer (age of onset: 65) in his niece; Cancer in his paternal aunt and paternal great-grandmother; Cancer (age of onset: 49) in his cousin; Cancer (age of onset: 73) in his niece; Colon cancer in his maternal uncle; Colon cancer (age of onset: 20) in his nephew; Dementia in his mother; Diabetes in his father; Heart disease in his father; Kidney disease in his brother; Lung cancer in his paternal aunt; Other in his niece and sister.   Allergies No Known Allergies   Home Medications  Prior to Admission medications   Medication Sig Start Date End Date Taking? Authorizing Provider  apixaban  (ELIQUIS ) 5 MG TABS tablet Take 1 tablet (5 mg total) by mouth 2 (two) times daily. 03/27/24  Yes Croitoru, Mihai, MD  atorvastatin  (LIPITOR) 40 MG tablet Take 1 tablet (40 mg total) by mouth daily. 06/29/24  Yes Janna Ferrier, DO  calcium  carbonate (OSCAL) 1500 (600 Ca) MG TABS tablet Take by mouth 2 (two) times daily with a meal.   Yes [provider]  Cyanocobalamin (VITAMIN B-12 PO) Take 1 capsule by mouth daily.   Yes [provider]  losartan  (COZAAR ) 100 MG tablet Take 1 tablet (100 mg total) by mouth daily. 05/10/24  Yes Janna Ferrier, DO  Magnesium Oxide 250  MG TABS Take 1 tablet by mouth daily in the afternoon. 02/07/18  Yes [provider]  Multiple Vitamin (MULTIVITAMIN ADULT PO) Take 1 tablet by mouth daily. 02/07/18  Yes [provider]  Potassium Gluconate 2 MEQ TABS Take 1 tablet by mouth daily in the afternoon. 02/07/18  Yes [provider]  sildenafil  (VIAGRA ) 50 MG tablet Take 2 tablets (100 mg total) by mouth daily as needed.  06/14/23  Yes Chambliss, Layman CROME, MD  cyclobenzaprine  (FLEXERIL ) 5 MG tablet Take 1 tablet (5 mg total) by mouth 2 (two) times daily as needed for muscle spasms. Patient not taking: Reported on 07/07/2024 06/17/24   Mayer, Jodi R, NP  lidocaine  (LIDODERM ) 5 % Place 1 patch onto the skin daily. Apply to affected area on neck/shoulder and keep in place for 12 hours and remove for 12 hours Patient not taking: Reported on 07/07/2024 06/17/24   Loreda Myla SAUNDERS, NP     Critical care time:     CRITICAL CARE Performed by: Donnice SAUNDERS Beals   Total critical care time: 31 minutes  Critical care time was exclusive of separately billable procedures and treating other patients.  Critical care was necessary to treat or prevent imminent or life-threatening deterioration.  Critical care was time spent personally by me on the following activities: development of treatment plan with patient and/or surrogate as well as nursing, discussions with consultants, evaluation of patient's response to treatment, examination of patient, obtaining history from patient or surrogate, ordering and performing treatments and interventions, ordering and review of laboratory studies, ordering and review of radiographic studies, pulse oximetry and re-evaluation of patient's condition.   Donnice SAUNDERS Beals, MD [facility] 07/09/2024, 9:23 AM  See Amion If no response to pager , please call [phone] until 7pm After 7:00 pm call Elink  [phone]

## 2024-07-09 NOTE — Progress Notes (Signed)
 Pharmacy Antibiotic Note  Wayne Molina is a 68 y.o. male admitted on 07/06/2024 with pneumonia.  Pharmacy has been consulted for ZOSYN  dosing.  Plan: Zosyn  3.375g IV q8h (4 hour infusion).  Height: 5' 9 (175.3 cm) Weight: 79.5 kg (175 lb 4.3 oz) IBW/kg (Calculated) : 70.7  Temp (24hrs), Avg:99.9 F (37.7 C), Min:99.3 F (37.4 C), Max:100.8 F (38.2 C)  Recent Labs  Lab 07/07/24 0003 07/07/24 0008 07/07/24 0602 07/08/24 0732 07/09/24 0414  WBC 8.2  --  13.4* 12.0* 11.7*  CREATININE 1.02 1.00 1.08  --  0.80    Estimated Creatinine Clearance: 85.9 mL/min (by C-G formula based on SCr of 0.8 mg/dL).    No Known Allergies    Thank you for allowing pharmacy to be a part of this patient's care.  Benedetta Heath BS, PharmD, BCPS Clinical Pharmacist 07/09/2024 12:42 PM  Contact: [phone] after 3 PM

## 2024-07-09 NOTE — Progress Notes (Signed)
 SLP Cancellation Note  Patient Details Name: Wayne Molina MRN: [id number] DOB: August 06, 1956   Cancelled treatment:       Reason Eval/Treat Not Completed: Patient not medically ready   Colson Barco, Consuelo Fitch 07/09/2024, 7:54 AM

## 2024-07-09 NOTE — Progress Notes (Signed)
 OT Cancellation Note  Patient Details Name: ANDRON MARRAZZO MRN: [id number] DOB: January 17, 1956   Cancelled Treatment:    Reason Eval/Treat Not Completed: Patient not medically ready. Patient remains intubated and with recent seizure activity. OT is signing off at this time. Please re-order when medically appropriate.   Nishtha Raider D Walton, OTD, OTR/L [facility] Office: [phone]  Elma JONETTA Penner 07/09/2024, 10:30 AM

## 2024-07-09 NOTE — Progress Notes (Signed)
 LTM maint complete - no skin breakdown under:  F7,Fp1,Fp2,F8.

## 2024-07-09 NOTE — Procedures (Addendum)
 Patient Name: Wayne Molina  MRN: [id number]  Epilepsy Attending: Arlin MALVA Krebs  Referring Physician/Provider: Khaliqdina, Salman, MD  Duration: 07/08/2024 9070 to 07/09/2024 9070   Patient history: 68 y.o. 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 EEG to evaluate for seizure   Level of alertness: comatose/ lethargic    AEDs during EEG study: LEV, Versed ,VPA   Technical aspects: This EEG study was done with scalp electrodes positioned according to the 10-20 International system of electrode placement. Electrical activity was reviewed with band pass filter of 1-70Hz , sensitivity of 7 uV/mm, display speed of 63mm/sec with a 60Hz  notched filter applied as appropriate. EEG data were recorded continuously and digitally stored.  Video monitoring was available and reviewed as appropriate.   Description: EEG showed continuous generalized and lateralized right hemisphere 3 to 6 Hz theta-delta slowing admixed with 13-15hz  beta activity. EEG also showed lateralized periodic discharges ( LPDs), at times with overriding fast activity at 1-1.5Hz  in right hemisphere. Hyperventilation and photic stimulation were not performed.      ABNORMALITY - Lateralized periodic discharges with overriding fast activity ( LPD +F)  right hemisphere - Continuous slow, generalized and lateralized right hemisphere   IMPRESSION: This study showed evidence of epileptogenicity and cortical dysfunction arising from right hemisphere likely due to underlying structural abnormality. This eeg pattern is on the ictal-interictal continuum with high risk of seizure recurrence.   Additionally there is severe diffuse encephalopathy, likely related to sedation. No definite seizures were seen throughout the recording.   Cathaleen Korol O Brandyn Lowrey

## 2024-07-09 NOTE — Progress Notes (Signed)
 PT Cancellation/Discharge Note  Patient Details Name: Wayne Molina MRN: [id number] DOB: Jun 14, 1956   Cancelled Treatment:    Reason Eval/Treat Not Completed: Patient not medically ready  Patient remains intubated and with recent seizure activity. PT is signing off at this time. Please re-order when medically appropriate.    Wayne Molina, PT Acute Rehabilitation Services  Office [phone]   Wayne Molina 07/09/2024, 8:59 AM

## 2024-07-09 NOTE — Progress Notes (Signed)
 Subjective: No clinical or electrographic seizures. Weaning versed   ROS: Unable to obtain due to poor mental status  Examination  Vital signs in last 24 hours: Temp:  [99 F (37.2 C)-100.4 F (38 C)] 100.2 F (37.9 C) (08/30 1800) Pulse Rate:  [49-83] 73 (08/30 1800) Resp:  [16-26] 18 (08/30 1800) BP: (107-185)/(57-95) 123/58 (08/30 1800) SpO2:  [79 %-100 %] 100 % (08/30 1800) FiO2 (%):  [40 %] 40 % (08/30 1422)  Examined on sedation. Does not open eyes to noxious stimuli, does not follow commands. PERRL, (+) corneals and oculocephalics, (-) cough, gag, no withdrawal to noxious stimuli in any extremity   Basic Metabolic Panel: Recent Labs  Lab 07/07/24 0003 07/07/24 0008 07/07/24 0602 07/07/24 1400 07/07/24 1446  NA 137 138 136 137  --   K 4.4 4.2 5.1 4.3  --   CL 102 102 103  --   --   CO2 25  --  23  --   --   GLUCOSE 125* 117* 170*  --   --   BUN 21 24* 20  --   --   CREATININE 1.02 1.00 1.08  --   --   CALCIUM  9.7  --  9.0  --   --   MG  --   --   --   --  2.0    CBC: Recent Labs  Lab 07/07/24 0003 07/07/24 0008 07/07/24 0602 07/07/24 1400 07/08/24 0732  WBC 8.2  --  13.4*  --  12.0*  NEUTROABS 3.9  --   --   --   --   HGB 15.6 15.6 15.5 12.9* 15.4  HCT 46.8 46.0 47.2 38.0* 48.2  MCV 92.9  --  94.2  --  97.2  PLT 250  --  224  --  171     Coagulation Studies: Recent Labs    07/07/24 0003  LABPROT 14.8  INR 1.1    Imaging personally reviewed  CT head without contrast 07/07/2024:  Now evident is an enhancing intra-axial mass involving the posterior right frontotemporal region, in the region of previously suspected right MCA infarct. The enhancing mass is bilobed, with dominant components measuring 2.9 cm and 3.7 cm at the right temporal lobe and splenium respectively. Surrounding vasogenic edema without midline shift. 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 Further evaluation with dedicated MRI,with and without contrast,  recommended.  CTA head and neck with and without contrast 07/07/2024: Negative CTA for acute large vessel occlusion or other emergentfinding. 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 Atheromatous change about the left carotid bulb with estimated 50% stenosis by NASCET criteria.  MRI brain with and without contrast 07/07/2024:Positive for relatively large and infiltrative tumor with heterogeneous enhancement tracking from the splenium of the corpus callosum near midline into the right temporal lobe. Confluent regional tumoral edema. No midline shift or significant intracranial mass effect. Top differential considerations are Glioblastoma and CNS Lymphoma. No other acute intracranial abnormality, with underlying small chronic cerebellar infarcts.  LTM EEG 8/29-8/31 ABNORMALITY - Lateralized periodic discharges with overriding fast activity ( LPD +F)  right hemisphere - Continuous slow, generalized and lateralized right hemisphere   IMPRESSION: This study showed evidence of epileptogenicity and cortical dysfunction arising from right hemisphere likely due to underlying structural abnormality. This eeg pattern is on the ictal-interictal continuum with high risk  of seizure recurrence.   Additionally there is severe diffuse encephalopathy, likely related to sedation. No seizures were seen throughout the recording.    ASSESSMENT AND PLAN:  68 y.o. male with hx of Htn, HLD, pAfib on eliquis , OSA who presents to the ED with acute onset L arm weakness and L hemianopsia.   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 I spent 35 minutes of neurocritical care time  in the care of  this patient. This was time spent independent of any time provided by nurse practitioner or PA.  Elida Ross, MD [facility] [phone]  If 7pm- 7am, please page neurology on call as listed in AMION.

## 2024-07-09 NOTE — Progress Notes (Addendum)
 PHARMACY - ANTICOAGULATION CONSULT NOTE  Pharmacy Consult for Heparin  Indication: atrial fibrillation  No Known Allergies  Patient Measurements: Height: 5' 9 (175.3 cm) Weight: 79.5 kg (175 lb 4.3 oz) IBW/kg (Calculated) : 70.7 HEPARIN  DW (KG): 73.9  Vital Signs: Temp: 100 F (37.8 C) (08/31 0800) Temp Source: Bladder (08/31 0800) BP: 136/68 (08/31 0800) Pulse Rate: 69 (08/31 0800)  Labs: Recent Labs    07/07/24 0003 07/07/24 0008 07/07/24 0602 07/07/24 1400 07/08/24 0523 07/08/24 0732 07/08/24 1405 07/09/24 0414 07/09/24 0919  HGB 15.6 15.6 15.5 12.9*  --  15.4  --  14.7  --   HCT 46.8 46.0 47.2 38.0*  --  48.2  --  43.7  --   PLT 250  --  224  --   --  171  --  216  --   APTT 36  --   --   --  72*  --  77* 82*  --   LABPROT 14.8  --   --   --   --   --   --   --   --   INR 1.1  --   --   --   --   --   --   --   --   HEPARINUNFRC  --   --   --   --  0.64  --  0.58  --  0.47  CREATININE 1.02 1.00 1.08  --   --   --   --  0.80  --     Estimated Creatinine Clearance: 85.9 mL/min (by C-G formula based on SCr of 0.8 mg/dL).   Assessment: 68 y.o. M on Eliquis  pta for afib. Holding for procedures - brain biopsy of tumor in next 1-2 days when stable. Last dose 8/28 0830. To begin heparin . Eliquis  will be affecting heparin  levels so will utilize aPTT for monitoring until levels correlate. CBC stable.  Heparin  level 0.58 (still appears affected by Eliquis ), aPTT 77 sec (therapeutic) on infusion at 1100 units/hr. No bleeding noted.  8/31 AM update: aPTT 82 seconds HL- 0.47 Hgb wnl PLT wnl No signs of bleeding / pauses w/ gtt  Goal of Therapy:  Heparin  level 0.3-0.7 units/ml aPTT 66-102 seconds Monitor platelets by anticoagulation protocol: Yes   Plan:  Continue heparin  gtt at 1100 units/hr Daily heparin  level and CBC DC aPTT [correlates w/ HL]  Kynslei Art BS, PharmD, BCPS Clinical Pharmacist 07/09/2024 2:54 PM  Contact: [phone] after 3 PM

## 2024-07-09 NOTE — Progress Notes (Signed)
 Initial Nutrition Assessment  DOCUMENTATION CODES:   Not applicable  INTERVENTION:  Initiate tube feeding via OGT: Start Osmolite 1.5 at 23ml/hr and advance by 10ml q6h to goal rate of 50 ml/h (1200 ml per day) Prosource TF20 60 ml once daily Provides 1880 kcal, 95 gm protein, 914 ml free water daily  NUTRITION DIAGNOSIS:   Inadequate oral intake related to acute illness as evidenced by NPO status.  GOAL:   Patient will meet greater than or equal to 90% of their needs  MONITOR:   Vent status, Labs, Weight trends, TF tolerance  REASON FOR ASSESSMENT:   Consult, Ventilator Enteral/tube feeding initiation and management  ASSESSMENT:   Pt admitted with AMS, found to have new brain mass. PMH significant for afib on eliquis , HTN, HLD.  8/28: admitted 8/29: intubated d/t acute hypoxic respiratory failure 8/31: tube feeding initiated  Patient is currently intubated on ventilator support MV: 10.1 L/min Temp (24hrs), Avg:99.9 F (37.7 C), Min:99.3 F (37.4 C), Max:100.8 F (38.2 C)  Per Neurosurgery, imagining suggests multilobular mass in the deep right temporo-parietal white matter extending into the splenium with surrounding edema; considerations for glioblastoma vs CNS lymphoma . Biopsy deferred pending improvement in seizures.   Review of weight history reflects stable weight with some fluctuations between 75-79 kg. No significant changes noted.   OGT (within proximal stomach)  Medications: colace BID, SSI 0-9 units q4h, miralax  daily, NaCl @ 75ml/hr  Labs: reviewed CBG's 79-119 x24 hours  NUTRITION - FOCUSED PHYSICAL EXAM: RD working remotely. Deferred to follow up.   Diet Order:   Diet Order             Diet NPO time specified  Diet effective now                   EDUCATION NEEDS:   No education needs have been identified at this time  Skin:  Skin Assessment: Reviewed RN Assessment  Last BM:  unknown  Height:   Ht Readings from Last 1  Encounters:  07/07/24 5' 9 (1.753 m)    Weight:   Wt Readings from Last 1 Encounters:  07/09/24 79.5 kg   BMI:  Body mass index is 25.88 kg/m.  Estimated Nutritional Needs:   Kcal:  1800-2000  Protein:  95-110g  Fluid:  >/=1.8L  Allie Providence Stivers, RDN, LDN Clinical Nutrition See AMiON for contact information.

## 2024-07-09 NOTE — Progress Notes (Signed)
 Increase temperature trend new fever, junky purulent sputum. LRCx ordered. Zosyn  ordered, avoid cephalosporins with AMS. Alter abx in future based on sputum culture results.

## 2024-07-10 ENCOUNTER — Inpatient Hospital Stay (HOSPITAL_COMMUNITY)

## 2024-07-10 ENCOUNTER — Encounter (HOSPITAL_COMMUNITY)

## 2024-07-10 DIAGNOSIS — R7401 Elevation of levels of liver transaminase levels: Secondary | ICD-10-CM

## 2024-07-10 DIAGNOSIS — G9341 Metabolic encephalopathy: Secondary | ICD-10-CM | POA: Diagnosis not present

## 2024-07-10 DIAGNOSIS — E878 Other disorders of electrolyte and fluid balance, not elsewhere classified: Secondary | ICD-10-CM

## 2024-07-10 DIAGNOSIS — D497 Neoplasm of unspecified behavior of endocrine glands and other parts of nervous system: Secondary | ICD-10-CM | POA: Diagnosis not present

## 2024-07-10 DIAGNOSIS — R569 Unspecified convulsions: Secondary | ICD-10-CM | POA: Diagnosis not present

## 2024-07-10 DIAGNOSIS — G9389 Other specified disorders of brain: Secondary | ICD-10-CM | POA: Diagnosis not present

## 2024-07-10 LAB — COMPREHENSIVE METABOLIC PANEL WITH GFR
ALT: 27 U/L (ref 0–44)
AST: 32 U/L (ref 15–41)
Albumin: 2.7 g/dL — ABNORMAL LOW (ref 3.5–5.0)
Alkaline Phosphatase: 43 U/L (ref 38–126)
Anion gap: 9 (ref 5–15)
BUN: 20 mg/dL (ref 8–23)
CO2: 22 mmol/L (ref 22–32)
Calcium: 8.1 mg/dL — ABNORMAL LOW (ref 8.9–10.3)
Chloride: 107 mmol/L (ref 98–111)
Creatinine, Ser: 0.84 mg/dL (ref 0.61–1.24)
GFR, Estimated: >60 mL/min (ref >60)
Glucose, Bld: 115 mg/dL — ABNORMAL HIGH (ref 70–99)
Potassium: 3.8 mmol/L (ref 3.5–5.1)
Sodium: 138 mmol/L (ref 135–145)
Total Bilirubin: 1.3 mg/dL — ABNORMAL HIGH (ref 0.0–1.2)
Total Protein: 5.8 g/dL — ABNORMAL LOW (ref 6.5–8.1)

## 2024-07-10 LAB — GLUCOSE, CAPILLARY
Glucose-Capillary: 111 mg/dL — ABNORMAL HIGH (ref 70–99)
Glucose-Capillary: 129 mg/dL — ABNORMAL HIGH (ref 70–99)
Glucose-Capillary: 136 mg/dL — ABNORMAL HIGH (ref 70–99)
Glucose-Capillary: 172 mg/dL — ABNORMAL HIGH (ref 70–99)
Glucose-Capillary: 202 mg/dL — ABNORMAL HIGH (ref 70–99)

## 2024-07-10 LAB — POCT I-STAT EG7
Acid-base deficit: 1 mmol/L (ref 0.0–2.0)
Bicarbonate: 24.4 mmol/L (ref 20.0–28.0)
Calcium, Ion: 1.19 mmol/L (ref 1.15–1.40)
HCT: 37 % — ABNORMAL LOW (ref 39.0–52.0)
Hemoglobin: 12.6 g/dL — ABNORMAL LOW (ref 13.0–17.0)
O2 Saturation: 59 %
Patient temperature: 38.5
Potassium: 4.4 mmol/L (ref 3.5–5.1)
Sodium: 138 mmol/L (ref 135–145)
TCO2: 26 mmol/L (ref 22–32)
pCO2, Ven: 42.7 mmHg — ABNORMAL LOW (ref 44–60)
pH, Ven: 7.371 (ref 7.25–7.43)
pO2, Ven: 34 mmHg (ref 32–45)

## 2024-07-10 LAB — PHOSPHORUS: Phosphorus: 2.4 mg/dL — ABNORMAL LOW (ref 2.5–4.6)

## 2024-07-10 LAB — HEPARIN LEVEL (UNFRACTIONATED): Heparin Unfractionated: 0.43 [IU]/mL (ref 0.30–0.70)

## 2024-07-10 LAB — BLOOD GAS, ARTERIAL
Acid-base deficit: 1 mmol/L (ref 0.0–2.0)
Bicarbonate: 22.2 mmol/L (ref 20.0–28.0)
Drawn by: 74417
O2 Saturation: 99.6 %
Patient temperature: 38.6
pCO2 arterial: 34 mmHg (ref 32–48)
pH, Arterial: 7.43 (ref 7.35–7.45)
pO2, Arterial: 122 mmHg — ABNORMAL HIGH (ref 83–108)

## 2024-07-10 LAB — PROCALCITONIN: Procalcitonin: 0.15 ng/mL

## 2024-07-10 LAB — MAGNESIUM: Magnesium: 2.3 mg/dL (ref 1.7–2.4)

## 2024-07-10 MED ORDER — ACETAMINOPHEN 160 MG/5ML PO SOLN
650.0000 mg | Freq: Four times a day (QID) | ORAL | Status: DC | PRN
  Administered 2024-07-10 – 2024-07-15 (×10): 650 mg
  Filled 2024-07-10 (×10): qty 20.3

## 2024-07-10 MED ORDER — LABETALOL HCL 5 MG/ML IV SOLN
10.0000 mg | INTRAVENOUS | Status: DC | PRN
  Administered 2024-07-11 (×4): 10 mg via INTRAVENOUS
  Filled 2024-07-10 (×3): qty 4

## 2024-07-10 MED ORDER — SODIUM CHLORIDE 0.9 % IV SOLN: INTRAVENOUS | Status: AC | PRN

## 2024-07-10 MED ORDER — MIDAZOLAM HCL 2 MG/2ML IJ SOLN
0.5000 mg | INTRAMUSCULAR | Status: DC | PRN
  Administered 2024-07-10: 0.5 mg via INTRAVENOUS
  Filled 2024-07-10: qty 2

## 2024-07-10 MED ORDER — DEXMEDETOMIDINE HCL IN NACL 400 MCG/100ML IV SOLN
INTRAVENOUS | Status: AC
  Administered 2024-07-10: 0.4 ug/kg/h via INTRAVENOUS
  Filled 2024-07-10: qty 100

## 2024-07-10 MED ORDER — SENNA 8.6 MG PO TABS
2.0000 | ORAL_TABLET | Freq: Two times a day (BID) | ORAL | Status: DC
  Administered 2024-07-10 (×2): 17.2 mg
  Filled 2024-07-10 (×2): qty 2

## 2024-07-10 MED ORDER — POLYETHYLENE GLYCOL 3350 17 G PO PACK
17.0000 g | PACK | Freq: Two times a day (BID) | ORAL | Status: DC
  Administered 2024-07-10: 17 g
  Filled 2024-07-10: qty 1

## 2024-07-10 MED ORDER — VANCOMYCIN HCL IN DEXTROSE 1-5 GM/200ML-% IV SOLN: 1000.0000 mg | Freq: Two times a day (BID) | INTRAVENOUS | Status: DC

## 2024-07-10 MED ORDER — MIDAZOLAM HCL 2 MG/2ML IJ SOLN
1.0000 mg | INTRAMUSCULAR | Status: DC | PRN
  Filled 2024-07-10: qty 2

## 2024-07-10 MED ORDER — FENTANYL CITRATE PF 50 MCG/ML IJ SOSY
12.5000 ug | PREFILLED_SYRINGE | INTRAMUSCULAR | Status: DC | PRN
  Administered 2024-07-11 (×4): 12.5 ug via INTRAVENOUS
  Filled 2024-07-10 (×5): qty 1

## 2024-07-10 MED ORDER — DEXMEDETOMIDINE HCL IN NACL 400 MCG/100ML IV SOLN
0.0000 ug/kg/h | INTRAVENOUS | Status: DC
  Administered 2024-07-10: 0.7 ug/kg/h via INTRAVENOUS
  Administered 2024-07-11: 1.2 ug/kg/h via INTRAVENOUS
  Administered 2024-07-11: 1 ug/kg/h via INTRAVENOUS
  Administered 2024-07-11: 0.3 ug/kg/h via INTRAVENOUS
  Administered 2024-07-11: 1.3 ug/kg/h via INTRAVENOUS
  Administered 2024-07-12: 1.1 ug/kg/h via INTRAVENOUS
  Administered 2024-07-12 (×2): 1.3 ug/kg/h via INTRAVENOUS
  Administered 2024-07-12: 1.2 ug/kg/h via INTRAVENOUS
  Administered 2024-07-12: 0.8 ug/kg/h via INTRAVENOUS
  Administered 2024-07-13: 1.3 ug/kg/h via INTRAVENOUS
  Filled 2024-07-10 (×11): qty 100

## 2024-07-10 MED ORDER — LABETALOL HCL 5 MG/ML IV SOLN: 20.0000 mg | INTRAVENOUS | Status: DC | PRN

## 2024-07-10 MED ORDER — POTASSIUM PHOSPHATES 15 MMOLE/5ML IV SOLN
30.0000 mmol | Freq: Once | INTRAVENOUS | Status: AC
  Administered 2024-07-10: 30 mmol via INTRAVENOUS
  Filled 2024-07-10: qty 10

## 2024-07-10 MED ORDER — SODIUM CHLORIDE 0.9 % IV SOLN
3.0000 g | Freq: Four times a day (QID) | INTRAVENOUS | Status: DC
  Administered 2024-07-10 – 2024-07-12 (×8): 3 g via INTRAVENOUS
  Filled 2024-07-10 (×8): qty 8

## 2024-07-10 MED ORDER — VANCOMYCIN HCL 1500 MG/300ML IV SOLN
1500.0000 mg | Freq: Once | INTRAVENOUS | Status: AC
  Administered 2024-07-10: 1500 mg via INTRAVENOUS
  Filled 2024-07-10: qty 300

## 2024-07-10 NOTE — Progress Notes (Signed)
 EEG LTM maintenance performed. No noted skin break down under FP2, FP1, F8, P8 and A2.

## 2024-07-10 NOTE — Progress Notes (Signed)
 Peripherally Inserted Central Catheter Placement  The IV Nurse has discussed with the patient and/or persons authorized to consent for the patient, the purpose of this procedure and the potential benefits and risks involved with this procedure.  The benefits include less needle sticks, lab draws from the catheter, and the patient may be discharged home with the catheter. Risks include, but not limited to, infection, bleeding, blood clot (thrombus formation), and puncture of an artery; nerve damage and irregular heartbeat and possibility to perform a PICC exchange if needed/ordered by physician.  Alternatives to this procedure were also discussed.  Bard Power PICC patient education guide, fact sheet on infection prevention and patient information card has been provided to patient /or left at bedside. Consent signed by wife due to altered mental status.   PICC Placement Documentation  PICC Triple Lumen 07/10/24 Right Brachial 36 cm 0 cm (Active)  Indication for Insertion or Continuance of Line Vasoactive infusions 07/10/24 1507  Exposed Catheter (cm) 0 cm 07/10/24 1507  Site Assessment Clean, Dry, Intact 07/10/24 1507  Lumen #1 Status Flushed;Saline locked;Blood return noted 07/10/24 1507  Lumen #2 Status Flushed;Saline locked;Blood return noted 07/10/24 1507  Lumen #3 Status Flushed;Saline locked;Blood return noted 07/10/24 1507  Dressing Type Transparent;Securing device 07/10/24 1507  Dressing Status Antimicrobial disc/dressing in place;Clean, Dry, Intact 07/10/24 1507  Line Care Connections checked and tightened 07/10/24 1507  Line Adjustment (NICU/IV Team Only) No 07/10/24 1507  Dressing Intervention New dressing;Adhesive placed at insertion site (IV team only) 07/10/24 1507  Dressing Change Due 07/17/24 07/10/24 1507       Mellody Masri, Cherene Place 07/10/2024, 3:07 PM

## 2024-07-10 NOTE — Progress Notes (Signed)
 Pharmacy Antibiotic Note  Wayne Molina is a 68 y.o. male admitted on 07/06/2024 with pneumonia and fevers after Zosyn  x3 doses.  Pharmacy has been consulted for vancomycin  and unasyn  dosing.  9/1 Vancomycin  1000mg  Q 12 hr with Est AUC: 456 Scr used: 0.8 mg/dL; Vd coeff: 0.72 L/kg  Plan: Stop zosyn  Unasyn  3g q6hr  Vancomycin  1000mg  q12hr Monitor cultures, clinical status, renal function, vancomycin  level Narrow abx as able and f/u duration   Height: 5' 9 (175.3 cm) Weight: 80.4 kg (177 lb 4 oz) IBW/kg (Calculated) : 70.7  Temp (24hrs), Avg:99.9 F (37.7 C), Min:98.8 F (37.1 C), Max:101.1 F (38.4 C)  Recent Labs  Lab 07/07/24 0003 07/07/24 0008 07/07/24 0602 07/08/24 0732 07/09/24 0414  WBC 8.2  --  13.4* 12.0* 11.7*  CREATININE 1.02 1.00 1.08  --  0.80    Estimated Creatinine Clearance: 85.9 mL/min (by C-G formula based on SCr of 0.8 mg/dL).    No Known Allergies  Antimicrobials this admission: Piptazo 8/31>>9/1 Unasyn  9/1 >> Vanc 9/1 >>   Dose adjustments this admission: N/a  Microbiology results: 8/31 TA- abundant SA and H influenzae 8/29 MRSA PCR: neg  Thank you for allowing pharmacy to be a part of this patient's care.   Jinnie Door, PharmD, BCPS, BCCP Clinical Pharmacist  Please check AMION for all [facility] Pharmacy phone numbers After 10:00 PM, call Main Pharmacy [phone]

## 2024-07-10 NOTE — Plan of Care (Signed)
  Problem: Respiratory: Goal: Ability to maintain a clear airway and adequate ventilation will improve Outcome: Progressing  Patient weaned on ventilator x 4 hours.    Problem: Clinical Measurements: Goal: Respiratory complications will improve Outcome: Progressing   Problem: Nutrition: Goal: Adequate nutrition will be maintained Outcome: Progressing  TF at goal.

## 2024-07-10 NOTE — TOC Initial Note (Signed)
 Transition of Care [facility]) - Initial/Assessment Note    Patient Details  Name: Wayne Molina MRN: [id number] Date of Birth: 28-Jan-1956  Transition of Care [facility]) CM/SW Contact:    Inocente GORMAN Kindle, LCSW Phone Number: 07/10/2024, 3:44 PM  Clinical Narrative:                 Patient admitted from home with spouse and is currently intubated. Please place consult for ICM as needs arise.     Barriers to Discharge: Continued Medical Work up   Patient Goals and CMS Choice            Expected Discharge Plan and Services       Living arrangements for the past 2 months: Single Family Home                                      Prior Living Arrangements/Services Living arrangements for the past 2 months: Single Family Home Lives with:: Spouse Patient language and need for interpreter reviewed:: Yes        Need for Family Participation in Patient Care: Yes (Comment) Care giver support system in place?: Yes (comment)   Criminal Activity/Legal Involvement Pertinent to Current Situation/Hospitalization: No - Comment as needed  Activities of Daily Living   ADL Screening (condition at time of admission) Independently performs ADLs?: No Does the patient have a NEW difficulty with bathing/dressing/toileting/self-feeding that is expected to last >3 days?: Yes (Initiates electronic notice to provider for possible OT consult) Does the patient have a NEW difficulty with getting in/out of bed, walking, or climbing stairs that is expected to last >3 days?: Yes (Initiates electronic notice to provider for possible PT consult) Does the patient have a NEW difficulty with communication that is expected to last >3 days?: Yes (Initiates electronic notice to provider for possible SLP consult) Is the patient deaf or have difficulty hearing?: No Does the patient have difficulty seeing, even when wearing glasses/contacts?: No Does the patient have difficulty concentrating, remembering, or making  decisions?: No  Permission Sought/Granted                  Emotional Assessment Appearance:: Appears stated age Attitude/Demeanor/Rapport: Unable to Assess, Intubated (Following Commands or Not Following Commands) Affect (typically observed): Unable to Assess   Alcohol / Substance Use: Not Applicable Psych Involvement: No (comment)  Admission diagnosis:  Seizure (HCC) [R56.9] Brain mass [G93.89] Acute encephalopathy [G93.40] Patient Active Problem List   Diagnosis Date Noted   New brain mass 07/07/2024   Chronic health problem 07/07/2024   Altered mental status 07/07/2024   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 07/07/2024   Concern for Stroke (HCC) 07/07/2024   Acute encephalopathy 07/07/2024   Right foot pain 12/16/2022   Chest pain 08/17/2022   Postural dizziness with presyncope 06/10/2022   Genetic testing 03/19/2021   Family history of gene mutation    Family history of breast cancer    Family history of malignant neoplasm of gastrointestinal tract    Family history of cancer of male genital organ    Peyronie disease 06/12/2020   Elevated bilirubin 05/26/2019   Lower urinary tract symptoms (LUTS) 05/24/2019   Osteoporosis 01/27/2017   Erectile dysfunction 01/29/2016   Syncope 01/01/2016   Lumbar vertebral fracture (HCC) 07/22/2015   Hyperlipidemia 07/22/2015   Myocardiopathy (HCC) 07/11/2013   Essential hypertension 05/23/2012   Sleep apnea: unable to tolerate CPAP  03/04/2011   Atrial fibrillation (HCC) 01/06/2007   PCP:  Janna Ferrier, DO Pharmacy:   [facility] [address] [place] [address] Phone: [phone] Fax: [phone]     Social Drivers of Health (SDOH) Social History: SDOH Screenings   Food Insecurity: No Food Insecurity (03/22/2024)  Housing: Low Risk  (03/22/2024)  Transportation Needs: No Transportation Needs (03/22/2024)  Utilities: Not At Risk (04/16/2023)  Alcohol Screen: Low Risk   (04/16/2023)  Depression (PHQ2-9): Low Risk  (03/23/2024)  Financial Resource Strain: Low Risk  (03/22/2024)  Physical Activity: Sufficiently Active (03/22/2024)  Social Connections: Unknown (03/22/2024)  Stress: No Stress Concern Present (03/22/2024)  Tobacco Use: Medium Risk (07/07/2024)   SDOH Interventions:     Readmission Risk Interventions     No data to display

## 2024-07-10 NOTE — Progress Notes (Signed)
   07/10/24 1000  Spiritual Encounters  Type of Visit Initial  Care provided to: Family  Reason for visit Advance directives  OnCall Visit Yes    Chaplain responded to AD spiritual consult. Pt Wayne Molina was intubated and unable to respond at this time. Paperwork and information given to wife Wayne Molina. Brother and sister-in-law also present. All questions answered and no further needs expressed. Chaplains remain available 24/7.

## 2024-07-10 NOTE — Procedures (Addendum)
 Patient Name: Wayne Molina  MRN: [id number]  Epilepsy Attending: Arlin MALVA Krebs  Referring Physician/Provider: Khaliqdina, Salman, MD  Duration: 07/09/2024 9070 to 07/10/2024 9070   Patient history: 68 y.o. 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 EEG to evaluate for seizure   Level of alertness: comatose/ lethargic    AEDs during EEG study: LEV   Technical aspects: This EEG study was done with scalp electrodes positioned according to the 10-20 International system of electrode placement. Electrical activity was reviewed with band pass filter of 1-70Hz , sensitivity of 7 uV/mm, display speed of 30mm/sec with a 60Hz  notched filter applied as appropriate. EEG data were recorded continuously and digitally stored.  Video monitoring was available and reviewed as appropriate.   Description: EEG showed continuous generalized and lateralized right hemisphere 3 to 6 Hz theta-delta slowing admixed with 13-15hz  beta activity. EEG also showed lateralized periodic discharges ( LPDs) at 0.5-1 Hz in right hemisphere which gradually improved and showed sharp waves in right hemisphere. Hyperventilation and photic stimulation were not performed.      ABNORMALITY - Lateralized periodic discharges ( LPD),  right hemisphere - Continuous slow, generalized and lateralized right hemisphere   IMPRESSION: This study showed evidence of epileptogenicity and cortical dysfunction arising from right hemisphere likely due to underlying structural abnormality. Additionally there is severe diffuse encephalopathy, likely related to sedation. No definite seizures were seen throughout the recording.  EEG appears to be improving compared to previous day.   Nevae Pinnix O Raquel Sayres

## 2024-07-10 NOTE — Progress Notes (Signed)
 NAME:  Wayne Molina, MRN:  [id number], DOB:  June 10, 1956, LOS: 3 ADMISSION DATE:  07/06/2024, CONSULTATION DATE:  07/07/24 REFERRING MD:  FPTS, CHIEF COMPLAINT:  AMS   History of Present Illness:  HPI obtained from EMR review and per wife at bedside as pt remains encephalopathic.   66 yoM with PMH as below significant for afib on Eliquis , HTN, HLD, and untreated OSA who presented to ER as code stroke after coworker noticed he was altered since 8/28 around 2230 and pale, pt reported feeling dizzy.  Wife reports pt has been in his normal state of health, just recently not sleeping well for the last week and fatigued.  Non smoker, wife denies ETOH use.  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  In ER, noted to have L sided weakness and left hemianopsia.  Last dose eliquis  reported 8/28.  Code stroke activated.  CTH initially concerning for right posterior MCA stroke but CTA negative for LVO.  Also noted to have rhythmic eye twitching, SBP 200s, and more lethargic, oriented to person only.  Repeat CTH concerning for mass.  EEG started, and loaded with keppra .  FPTS to admit.  Given total of 4mg  ativan  for MRI, seizure concern, and intermittent agitation.  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  NSGY consulted, plans for biopsy 8/30.  PCCM consulted for ongoing encephalopathy and concern for airway protection, remains on 2L, no reported hypoxia.   Pertinent  Medical History   Past Medical History:  Diagnosis Date   Anxiety    Arthritis    fingers   Chronic atrial fibrillation (HCC)    Depression    Family history of breast cancer    Family history of cancer of male genital organ    Family history of gene mutation    BRIP1   Family history of  malignant neoplasm of gastrointestinal tract    Hyperlipidemia    Hypertension    Personal history of colonic polyps 11/12/2006   Sleep apnea     Significant Hospital Events: Including procedures, antibiotic start and stop dates in addition to other pertinent events   8/28 admitted seizures. LTM initiated .  8/29  PCCM consulted. Intubated for AW protection. . CT head  The enhancing mass is bilobed, with dominant components measuring 2.9 cm and 3.7 cm at the right temporal lobe and splenium respectively. Surrounding vasogenic edema without midline shift. MRI brain Positive for relatively large and infiltrative tumor with heterogeneous enhancement tracking from the splenium of the corpus callosum near midline into the right temporal lobe. Confluent regional tumoral edema 8/30 evidence of epileptogenicity and cortical dysfunction arising from right hemisphere likely due to underlying structural abnormality. Seen by neuro surg w/ plan for Bx after off vent and stable 8/31 1-2 different episodes of jerking concerning for seizures.  None seen on EEG.  Midazolam  titration down.  Added Zosyn  for fever 9/1 stopping Versed .  Weaning some.  Sputum culture growing staph started on vancomycin  until sensitivities back  Interim History / Subjective:  Starting to move a little bit more following Versed  weaning Objective    Blood pressure 110/63, pulse 67, temperature 99.5 F (37.5 C), resp. rate 18, height 5' 9 (1.753 m), weight 80.4 kg, SpO2 100%.    Vent Mode: PRVC FiO2 (%):  [30 %-40 %]  40 % Set Rate:  [18 bmp] 18 bmp Vt Set:  [560 mL] 560 mL PEEP:  [5 cmH20] 5 cmH20 Plateau Pressure:  [13 cmH20-15 cmH20] 14 cmH20   Intake/Output Summary (Last 24 hours) at 07/10/2024 0740 Last data filed at 07/10/2024 0600 Gross per 24 hour  Intake 1431.31 ml  Output 700 ml  Net 731.31 ml   Filed Weights   07/07/24 0039 07/09/24 0411 07/10/24 0500  Weight: 73.9 kg 79.5 kg 80.4 kg    Examination: General  This is a chronically ill 68 year old male patient currently sedated on Versed  infusion not following commands, does cough, does localize Neuro sedated, moves all extremities with painful stimulus, seemingly purposeful and localizing no focal deficits appreciated, he is not opening his eyes, nor is he following commands. Pulmonary clear currently, diminished bases.  Tracheal suctioning improved since day prior, placed on pressure support ventilation of 5/PEEP 5 tidal volumes in the 400 range.  Portable chest x-ray personally viewed: This demonstrates his endotracheal tube to be in satisfactory position.  He does appear to have some right greater than left airspace disease compared to prior film, also has left basilar volume loss. 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 Encephalopathy - Appreciate Neurology and NSGY input Plan Serial neuro checks LTM & AEDs per epileptology and neuro Minimize sedation Supportive care including: HOB elevated, avoid fevers and keep euglycemic Seizure precautions Eventual brain bx w/ Dr Rosslyn timing TBD  Ventilator management  - s/p intubation for airway protection Plan Continued ventilatory support with daily SBT PAD protocol RASS goal 0 to -1 VAP bundle  Hope with stopping sedation we should be able to extubate him either today or tomorrow  Staph pneumonia Still spiking fevers, started on antibiotics 8/31, MRSA negative Plan Day #2 Zosyn  add vancomycin  Await final cultures If MRSA negative at this point we will change to cefazolin  2 g IV Q8  Afib on eliquis  - last dose 8/28 Plan tele monitoring Rate control K > 4/Mg > 2 cont to hold eliquis  until biopsy plans are finalized Heparin  gtt for stroke prophylaxis (will need to be  held P/t Bx  Fluid and electrolyte imbalance & acid base imbalance  hypophosphatemia, NAGMA Bicarb gtt from 23 to 18 on 8/31. Not hyperchloremic.  Plan Will get CMP today & in am  Keep euvolemic Replace P)4  Abnormal LFTs Plan  Trend LFTs in light of AEDs  HTN HLD plan hold home losartan  for now with sedation  Hyperglycemia - A1c 6.0 plan  CBG q4 Ssi goal 140-180    Wife, [name] [phone], updated at bedside and plan of care.     Critical care time: 45 minutes    CRITICAL CARE Performed by: Jeralyn FORBES Banner

## 2024-07-10 NOTE — Progress Notes (Signed)
 Subjective: Wife, brother and brother's wife at bedside. Weaning on PS this morning.  Off Versed  as well.  Tmax 100.4 Fahrenheit  ROS: Unable to obtain due to poor mental status  Examination  Vital signs in last 24 hours: Temp:  [98.8 F (37.1 C)-100.4 F (38 C)] 99.5 F (37.5 C) (09/01 0600) Pulse Rate:  [64-92] 75 (09/01 0802) Resp:  [14-25] 18 (09/01 0600) BP: (93-161)/(61-98) 110/63 (09/01 0600) SpO2:  [100 %] 100 % (09/01 0600) FiO2 (%):  [30 %-40 %] 40 % (09/01 0802) Weight:  [80.4 kg] 80.4 kg (09/01 0500)  General: lying in bed, intubated Neuro: Does not open eyes to noxious stimulation, does not follow commands, PERRLA, no Kocis deviation, did not withdraw bilateral upper extremities for me but notes that he does move it voluntarily, withdraws to noxious stimuli in bilateral lower extremities with antigravity strength  Basic Metabolic Panel: Recent Labs  Lab 07/07/24 0003 07/07/24 0008 07/07/24 0602 07/07/24 1400 07/07/24 1446 07/09/24 0414 07/10/24 0503  NA 137 138 136 137  --  136  --   K 4.4 4.2 5.1 4.3  --  4.0  --   CL 102 102 103  --   --  107  --   CO2 25  --  23  --   --  18*  --   GLUCOSE 125* 117* 170*  --   --  119*  --   BUN 21 24* 20  --   --  16  --   CREATININE 1.02 1.00 1.08  --   --  0.80  --   CALCIUM  9.7  --  9.0  --   --  8.5*  --   MG  --   --   --   --  2.0 2.1 2.3  PHOS  --   --   --   --   --  2.5 2.4*    CBC: Recent Labs  Lab 07/07/24 0003 07/07/24 0008 07/07/24 0602 07/07/24 1400 07/08/24 0732 07/09/24 0414  WBC 8.2  --  13.4*  --  12.0* 11.7*  NEUTROABS 3.9  --   --   --   --   --   HGB 15.6 15.6 15.5 12.9* 15.4 14.7  HCT 46.8 46.0 47.2 38.0* 48.2 43.7  MCV 92.9  --  94.2  --  97.2 92.6  PLT 250  --  224  --  171 216     Coagulation Studies: No results for input(s): LABPROT, INR in the last 72 hours.  Imaging personally reviewed  CT head without contrast 07/08/2024: Stable head CT with infiltrating mass involving  posterior right temporal lobe and splenium with surrounding vasogenic edema.  No other acute abnormality  ASSESSMENT AND PLAN:68 y.o. 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  Will likely DC tomorrow if no seizures -As needed Versed  for clinical seizures - Please use Precedex  or fentanyl  for his agitation or sedation -Discussed plan with ICU team and family at bedside -Neurology will continue to follow    I personally spent a total of 38 minutes in the care of the patient today including getting/reviewing separately obtained history, performing  a medically appropriate exam/evaluation, counseling and educating, placing orders, referring and communicating with other health care professionals, documenting clinical information in the EHR, independently interpreting results, and coordinating care.         Wayne Molina Epilepsy [facility] For questions after 5pm please refer to AMION to reach the Neurologist on call

## 2024-07-10 NOTE — Progress Notes (Signed)
 PHARMACY - ANTICOAGULATION CONSULT NOTE  Pharmacy Consult for Heparin  Indication: atrial fibrillation  No Known Allergies  Patient Measurements: Height: 5' 9 (175.3 cm) Weight: 80.4 kg (177 lb 4 oz) IBW/kg (Calculated) : 70.7 HEPARIN  DW (KG): 73.9  Vital Signs: Temp: 99.5 F (37.5 C) (09/01 0600) BP: 110/63 (09/01 0600) Pulse Rate: 67 (09/01 0600)  Labs: Recent Labs    07/07/24 1400 07/07/24 1400 07/08/24 0523 07/08/24 0732 07/08/24 1405 07/09/24 0414 07/09/24 0919 07/10/24 0503  HGB 12.9*  --   --  15.4  --  14.7  --   --   HCT 38.0*  --   --  48.2  --  43.7  --   --   PLT  --   --   --  171  --  216  --   --   APTT  --   --  72*  --  77* 82*  --   --   HEPARINUNFRC  --    < > 0.64  --  0.58  --  0.47 0.43  CREATININE  --   --   --   --   --  0.80  --   --    < > = values in this interval not displayed.    Estimated Creatinine Clearance: 85.9 mL/min (by C-G formula based on SCr of 0.8 mg/dL).   Assessment: 68 y.o. M on Eliquis  pta for afib. Holding for procedures - brain biopsy of tumor when stable. Last dose 8/28 0830.  Pharmacy consulted for heparin .    Heparin  level 0.43 is therapeutic on 1100 units/hr.  Goal of Therapy:  Heparin  level 0.3-0.7 units/ml aPTT 66-102 seconds Monitor platelets by anticoagulation protocol: Yes   Plan:  Continue heparin  gtt at 1100 units/hr Monitor daily heparin  level, CBC, signs/symptoms of bleeding  F/u timing of brain biopsy   Jinnie Door, PharmD, BCPS, BCCP Clinical Pharmacist  Please check AMION for all [facility] Pharmacy phone numbers After 10:00 PM, call Main Pharmacy [phone]

## 2024-07-10 NOTE — Progress Notes (Signed)
 ABG Drawn on Right Radial, with pos. Allen test at 19:03 sent normal to Lab

## 2024-07-10 NOTE — Progress Notes (Signed)
 Called re: concern about possible arterial placement per CXR findings  Checked VBG from PICC: PO2 in 30s Transduced CVP from PICC. Venous waveform confirmed  Impression PICC in venous circulation not arterial OK to use PICC

## 2024-07-11 ENCOUNTER — Inpatient Hospital Stay (HOSPITAL_COMMUNITY)

## 2024-07-11 ENCOUNTER — Inpatient Hospital Stay: Payer: Self-pay

## 2024-07-11 DIAGNOSIS — D497 Neoplasm of unspecified behavior of endocrine glands and other parts of nervous system: Secondary | ICD-10-CM | POA: Diagnosis not present

## 2024-07-11 DIAGNOSIS — R569 Unspecified convulsions: Secondary | ICD-10-CM | POA: Diagnosis not present

## 2024-07-11 DIAGNOSIS — I4891 Unspecified atrial fibrillation: Secondary | ICD-10-CM | POA: Diagnosis not present

## 2024-07-11 DIAGNOSIS — E440 Moderate protein-calorie malnutrition: Secondary | ICD-10-CM

## 2024-07-11 DIAGNOSIS — J15211 Pneumonia due to Methicillin susceptible Staphylococcus aureus: Secondary | ICD-10-CM

## 2024-07-11 DIAGNOSIS — G9389 Other specified disorders of brain: Secondary | ICD-10-CM | POA: Diagnosis not present

## 2024-07-11 LAB — PROCALCITONIN: Procalcitonin: <0.1 ng/mL

## 2024-07-11 LAB — GLUCOSE, CAPILLARY
Glucose-Capillary: 139 mg/dL — ABNORMAL HIGH (ref 70–99)
Glucose-Capillary: 171 mg/dL — ABNORMAL HIGH (ref 70–99)
Glucose-Capillary: 180 mg/dL — ABNORMAL HIGH (ref 70–99)
Glucose-Capillary: 190 mg/dL — ABNORMAL HIGH (ref 70–99)
Glucose-Capillary: 192 mg/dL — ABNORMAL HIGH (ref 70–99)

## 2024-07-11 LAB — CBC
HCT: 40.8 % (ref 39.0–52.0)
Hemoglobin: 13.4 g/dL (ref 13.0–17.0)
MCH: 30.7 pg (ref 26.0–34.0)
MCHC: 32.8 g/dL (ref 30.0–36.0)
MCV: 93.6 fL (ref 80.0–100.0)
Platelets: 189 K/uL (ref 150–400)
RBC: 4.36 MIL/uL (ref 4.22–5.81)
RDW: 12.7 % (ref 11.5–15.5)
WBC: 5 K/uL (ref 4.0–10.5)
nRBC: 0 % (ref 0.0–0.2)

## 2024-07-11 LAB — HEPARIN LEVEL (UNFRACTIONATED): Heparin Unfractionated: 0.33 [IU]/mL (ref 0.30–0.70)

## 2024-07-11 LAB — COMPREHENSIVE METABOLIC PANEL WITH GFR
ALT: 27 U/L (ref 0–44)
AST: 29 U/L (ref 15–41)
Albumin: 2.7 g/dL — ABNORMAL LOW (ref 3.5–5.0)
Alkaline Phosphatase: 64 U/L (ref 38–126)
Anion gap: 9 (ref 5–15)
BUN: 18 mg/dL (ref 8–23)
CO2: 23 mmol/L (ref 22–32)
Calcium: 8.1 mg/dL — ABNORMAL LOW (ref 8.9–10.3)
Chloride: 105 mmol/L (ref 98–111)
Creatinine, Ser: 0.78 mg/dL (ref 0.61–1.24)
GFR, Estimated: >60 mL/min (ref >60)
Glucose, Bld: 145 mg/dL — ABNORMAL HIGH (ref 70–99)
Potassium: 4 mmol/L (ref 3.5–5.1)
Sodium: 137 mmol/L (ref 135–145)
Total Bilirubin: 1.2 mg/dL (ref 0.0–1.2)
Total Protein: 6 g/dL — ABNORMAL LOW (ref 6.5–8.1)

## 2024-07-11 LAB — PHOSPHORUS: Phosphorus: 2.5 mg/dL (ref 2.5–4.6)

## 2024-07-11 LAB — CULTURE, RESPIRATORY W GRAM STAIN: Special Requests: NORMAL

## 2024-07-11 LAB — MAGNESIUM: Magnesium: 2.2 mg/dL (ref 1.7–2.4)

## 2024-07-11 MED ORDER — FENTANYL BOLUS VIA INFUSION
25.0000 ug | INTRAVENOUS | Status: DC | PRN
  Administered 2024-07-11 – 2024-07-12 (×3): 25 ug via INTRAVENOUS
  Administered 2024-07-12: 50 ug via INTRAVENOUS
  Administered 2024-07-12 (×3): 100 ug via INTRAVENOUS
  Administered 2024-07-12: 25 ug via INTRAVENOUS
  Administered 2024-07-12 – 2024-07-13 (×7): 100 ug via INTRAVENOUS
  Administered 2024-07-13: 50 ug via INTRAVENOUS

## 2024-07-11 MED ORDER — POLYETHYLENE GLYCOL 3350 17 G PO PACK: 17.0000 g | PACK | Freq: Every day | ORAL | Status: DC | PRN

## 2024-07-11 MED ORDER — MIDAZOLAM HCL 2 MG/2ML IJ SOLN
INTRAMUSCULAR | Status: AC
Start: 2024-07-11 — End: 2024-07-12
  Filled 2024-07-11: qty 2

## 2024-07-11 MED ORDER — FENTANYL CITRATE PF 50 MCG/ML IJ SOSY
25.0000 ug | PREFILLED_SYRINGE | Freq: Once | INTRAMUSCULAR | Status: AC
  Administered 2024-07-11: 50 ug via INTRAVENOUS
  Filled 2024-07-11: qty 1

## 2024-07-11 MED ORDER — MIDAZOLAM HCL 2 MG/2ML IJ SOLN: 1.0000 mg | INTRAMUSCULAR | Status: DC | PRN

## 2024-07-11 MED ORDER — MIDAZOLAM HCL 2 MG/2ML IJ SOLN
1.0000 mg | INTRAMUSCULAR | Status: DC | PRN
  Administered 2024-07-12 – 2024-07-13 (×2): 2 mg via INTRAVENOUS
  Filled 2024-07-11 (×2): qty 2

## 2024-07-11 MED ORDER — LABETALOL HCL 5 MG/ML IV SOLN
20.0000 mg | INTRAVENOUS | Status: AC | PRN
  Administered 2024-07-13 (×6): 20 mg via INTRAVENOUS
  Filled 2024-07-11 (×6): qty 4

## 2024-07-11 MED ORDER — MIDAZOLAM HCL 2 MG/2ML IJ SOLN
1.0000 mg | INTRAMUSCULAR | Status: DC | PRN
  Administered 2024-07-11 (×2): 1 mg via INTRAVENOUS
  Filled 2024-07-11: qty 2

## 2024-07-11 MED ORDER — FENTANYL CITRATE PF 50 MCG/ML IJ SOSY: 12.5000 ug | PREFILLED_SYRINGE | INTRAMUSCULAR | Status: DC | PRN

## 2024-07-11 MED ORDER — VANCOMYCIN HCL 1000 MG IV SOLR
1000.0000 mg | Freq: Two times a day (BID) | INTRAVENOUS | Status: DC
  Administered 2024-07-11: 1000 mg via INTRAVENOUS
  Filled 2024-07-11 (×2): qty 20

## 2024-07-11 MED ORDER — HYDRALAZINE HCL 25 MG PO TABS
25.0000 mg | ORAL_TABLET | Freq: Four times a day (QID) | ORAL | Status: DC
  Administered 2024-07-11 – 2024-07-13 (×7): 25 mg
  Filled 2024-07-11 (×7): qty 1

## 2024-07-11 MED ORDER — FENTANYL 2500MCG IN NS 250ML (10MCG/ML) PREMIX INFUSION
0.0000 ug/h | INTRAVENOUS | Status: DC
  Administered 2024-07-11: 25 ug/h via INTRAVENOUS
  Administered 2024-07-12: 50 ug/h via INTRAVENOUS
  Filled 2024-07-11 (×2): qty 250

## 2024-07-11 NOTE — Progress Notes (Signed)
 vLTM maintenance  All impedances below 10k  No skin breakdown noted at FP1  FP2  FZ F3 F4

## 2024-07-11 NOTE — Progress Notes (Signed)
 eLink Physician-Brief Progress Note Patient Name: [name] DOB: [birth year] MRN: [id number]   Date of Service  07/11/2024  HPI/Events of Note  has received 6 doses of Labetalol  since 1740 SBP<160 goal Hold home cozar  eICU Interventions  Increase labetalol  dose Add Hydralazine  per tube     Intervention Category Intermediate Interventions: Hypertension - evaluation and management  Rashawnda Gaba 07/11/2024, 9:27 PM

## 2024-07-11 NOTE — Progress Notes (Signed)
 Nutrition Follow-up  DOCUMENTATION CODES:   Non-severe (moderate) malnutrition in context of chronic illness  INTERVENTION:   Continue tube feeding via Cortrak: - Osmolite 1.5 @ 50 mL/hr (1200 mL/day) - PROSource TF20 60 mL daily  Tube feeding regimen provides 1880 kcal, 95 grams of protein, and 914 ml of H2O.  NUTRITION DIAGNOSIS:   Moderate Malnutrition related to chronic illness as evidenced by moderate fat depletion, severe muscle depletion.  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 PMH significant for afib on eliquis , HTN, HLD.  8/28 - admitted 8/29 - intubated d/t acute hypoxic respiratory failure 8/31 - tube feeding initiated 9/02 - Cortrak placed, tip gastric  Per notes, initial plan was for extubation today, but pt remains on precedex  and is not following commands. Barriers to extubation include mental status and secretion burden. OG tube exchanged for Cortrak tube today.  RD able to complete NFPE with findings consistent with moderate malnutrition in the setting of chronic illness. Spoke with RN who reports no issues with tube feeding tolerance. Will continue with current tube feeding regimen.  Admit weight: 73.9 kg (appears stated) Current weight: 77.1 kg  Current TF: Osmolite 1.5 @ 50 mL/hr, PROSource TF20 60 mL daily  Patient remains intubated on ventilator support Temp (24hrs), Avg:100.8 F (38.2 C), Min:100.4 F (38 C), Max:101.5 F (38.6 C)  Drips: Precedex  Heparin   Medications reviewed and include: pepcid , SSI every 4 hours, IV abx  Labs reviewed. CBG's: 129-202 x 24 hours  UOP: 976 mL x 24 hours  NUTRITION - FOCUSED PHYSICAL  EXAM:  Flowsheet Row Most Recent Value  Orbital Region Moderate depletion  Upper Arm Region Mild depletion  Thoracic and Lumbar Region Mild depletion  Buccal Region Moderate depletion  Temple Region Severe depletion  Clavicle Bone Region Moderate depletion  Clavicle and Acromion Bone Region Moderate depletion  Scapular Bone Region Unable to assess  Dorsal Hand Moderate depletion  Patellar Region Severe depletion  Anterior Thigh Region Severe depletion  Posterior Calf Region Moderate depletion  Edema (RD Assessment) None  Hair Reviewed  Eyes Reviewed  Mouth Reviewed  Skin Reviewed  Nails Reviewed    Diet Order:   Diet Order             Diet NPO time specified  Diet effective now                   EDUCATION NEEDS:   Not appropriate for education at this time  Skin:  Skin Assessment: Reviewed RN Assessment (Irritant Contact Dermatitis to Bilateral Buttocks)  Last BM:  07/11/24 large type 6, medium type 7  Height:   Ht Readings from Last 1 Encounters:  07/07/24 5' 9 (1.753 m)    Weight:   Wt Readings from Last 1 Encounters:  07/11/24 77.1 kg    BMI:  Body mass index is 25.1 kg/m.  Estimated Nutritional Needs:   Kcal:  1800-2000  Protein:  95-110g  Fluid:  >/=1.8L    Mallie Satchel, MS, RD, LDN Registered Dietitian II Please see AMiON for contact information.

## 2024-07-11 NOTE — Progress Notes (Signed)
 NAME:  Wayne Molina, MRN:  [id number], DOB:  01/14/1956, LOS: 4 ADMISSION DATE:  07/06/2024, CONSULTATION DATE:  07/07/2024 REFERRING MD:  SHIRLEYANN, CHIEF COMPLAINT:  AMS   History of Present Illness:  68 year old man who presented to [facility] 8/28 with AMS, Code Stroke. PMHx significant for HTN, HLD, Afib on Eliquis , untreated OSA.  Patient presented to ER as a Code Stroke after coworker noticed he was altered since 8/28 around 2230 and pale, patient reported feeling dizzy.  Wife reports pt has been in his normal state of health, just recently not sleeping well for the last week and fatigued.  Nonsmoker, wife denies ETOH use. 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  In ER, noted to have L sided weakness and left hemianopsia.  Last dose Eliquis  reported 8/28.  Code Stroke activated.  CT Head initially concerning for right posterior MCA stroke but CTA negative for LVO.  Also noted to have rhythmic eye twitching, SBP 200s, and more lethargic, oriented to person only.  Repeat CT Head concerning for mass. EEG started and Keppra  load given. FPTS admitted.  Patient was given a total of 4mg  Ativan  for MRI, seizure concern, and intermittent agitation.  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 NSGY was consulted with plan for eventual biopsy.    PCCM consulted for ongoing encephalopathy and concern for airway protection. Intubated 8/29.  Pertinent Medical History:   Past Medical History:  Diagnosis Date   Anxiety    Arthritis    fingers   Chronic atrial fibrillation (HCC)    Depression    Family history of breast cancer    Family history of cancer of male genital organ    Family history of gene mutation    BRIP1   Family history of malignant neoplasm of  gastrointestinal tract    Hyperlipidemia    Hypertension    Personal history of colonic polyps 11/12/2006   Sleep apnea    Significant Hospital Events: Including procedures, antibiotic start and stop dates in addition to other pertinent events   8/28 Admitted seizures. LTM initiated .  8/29 PCCM consulted. Intubated for AW protection. CT Head with enhancing mass (bilobed), with dominant components measuring 2.9 cm and 3.7 cm at the right temporal lobe and splenium respectively. Surrounding vasogenic edema without midline shift. MRI brain Positive for relatively large and infiltrative tumor with heterogeneous enhancement tracking from the splenium of the corpus callosum near midline into the right temporal lobe. Confluent regional tumoral edema. 8/30 Evidence of epileptogenicity and cortical dysfunction arising from right hemisphere likely due to underlying structural abnormality. Seen by neuro surg w/ plan for Bx after off vent and stable 8/31 1-2 Different episodes of jerking concerning for seizures.  None seen on EEG.  Midazolam  titration down.  Added Zosyn  for fever 9/1 Stopping Versed .  Weaning some. Sputum culture growing staph started on vancomycin  until sensitivities back 9/2 Resp Cx with abundant staph aureus, abundant H. Flu. Abx transitioned to Unasyn .  Interim History/Subjective:  No significant events overnight Initial plan for possible extubation today, but remains on Precedex  and not following commands Ongoing copious secretions Resp Cx growing S. aureus (sensitivities pending) and H. Flu Remains on Unasyn  Wife, brother updated at bedside  Objective:   Blood pressure 130/80, pulse 74, temperature (!) 100.4 F (38 C), resp. rate ROLLEN)  22, height 5' 9 (1.753 m), weight 77.1 kg, SpO2 (!) 76%. CVP:  [12 mmHg-20 mmHg] 20 mmHg  Vent Mode: PRVC FiO2 (%):  [40 %] 40 % Set Rate:  [18 bmp] 18 bmp Vt Set:  [560 mL] 560 mL PEEP:  [5 cmH20] 5 cmH20 Pressure Support:  [5 cmH20] 5  cmH20 Plateau Pressure:  [14 cmH20-19 cmH20] 19 cmH20   Intake/Output Summary (Last 24 hours) at 07/11/2024 0729 Last data filed at 07/11/2024 0600 Gross per 24 hour  Intake 3245.58 ml  Output 976 ml  Net 2269.58 ml   Filed Weights   07/09/24 0411 07/10/24 0500 07/11/24 0458  Weight: 79.5 kg 80.4 kg 77.1 kg   Physical Examination: General: Acutely ill-appearing older man in NAD. Appears uncomfortable. HEENT: [place]/AT, anicteric sclera, PERRL 3mm sluggish, moist mucous membranes. Neuro: Awake, eyes closed/intermittently agitated. On forced eye opening, noted a few beats of R horizontal nystagmus. Grimacing to pain, restless and mouthing words around ETT but not following commands. Unilateral L-sided neglect noted. +Cough and +Gag CV: Irregularly irregular rhythm, rate 70s, no m/g/r. PULM: Breathing even and unlabored on vent (PSV 12/5, FiO2 40%). Copious secretions. Lung fields with coarse rhonchi. GI: Soft, nontender, nondistended. Normoactive bowel sounds. Extremities: No significant LE edema noted. Skin: Warm/dry, no rashes.  Resolved Problem List:  Status epilepticus resolved as of 8/31 Abnormal LFTs - normalized 9/2  Assessment and Plan:   Brain mass with new onset seizures and resultant metabolic encephalopathy - Neuro and NSGY following, appreciate recommendations - Serial neuro exams - LTM EEG (per Neuro), plan to continue x 24H once sedation weaned, weaning Precedex  as able - Minimizing sedation as able - Neuroprotective measures: HOB > 30 degrees, normoglycemia, normothermia, electrolytes WNL - Seizure precautions - Will eventually require brain Bx with NSGY (Janjua), tentatively planned for 9/4 1400  Ventilator management  S/p intubation for airway protection. - Continue full vent support (4-8cc/kg IBW) - Wean FiO2 for O2 sat > 90% - Daily WUA/SBT, barriers to extubation remain mental status and secretion burden - VAP bundle - Pulmonary hygiene - PAD protocol for  sedation: Precedex  and Fentanyl  for goal RASS 0 to -1  Staph aureus pneumonia Still spiking fevers, Tmax 101.39F 9/1. Started on antibiotics 8/31, MRSA negative. Resp Cx 8/31 with abundant staph aureus, abundant H. Flu. - Trend WBC, fever curve - APAP for fever control - F/u Cx data - Continue broad-spectrum antibiotics (Vanc/Zosyn  transitioned to Unasyn  9/1)  AFib on Eliquis  Last dose 8/28. - Cardiac monitoring - Optimize electrolytes for K > 4, Mg > 2 - Heparin  gtt for [facility], continue until Bx completed  Fluid and electrolyte imbalance & acid base imbalance  hypophosphatemia, NAGMA Bicarb gtt from 23 to 18 on 8/31. Not hyperchloremic.  - Trend BMP, Mg, Phos - Replete electrolytes as indicated - Monitor I&Os  HTN HLD - Hold home antihypertensives for now  Hyperglycemia A1c 6.0. - SSI - CBGs Q4H - Goal CBG 140-180  Critical care time:    The patient is critically ill with multiple organ system failure and requires high complexity decision making for assessment and support, frequent evaluation and titration of therapies, advanced monitoring, review of radiographic studies and interpretation of complex data.   Critical Care Time devoted to patient care services, exclusive of separately billable procedures, described in this note is 37 minutes.  Corean CHRISTELLA Daryle Boyington, PA-C [facility] 07/11/24 7:30 AM  Please see [email or web address] for pager details.  From 7A-7P if no response, please  call [phone] After hours, please call ELink [phone]

## 2024-07-11 NOTE — Procedures (Signed)
 Cortrak  Tube Type:  Cortrak - 43 inches Tube Location:  Left nare Secured by: Bridle Initial Placement:  Gastric Technique Used to Measure Tube Placement:  Marking at nare/corner of mouth Cortrak Secured At:  75 cm   Cortrak Tube Team Note:  Consult received to place a Cortrak feeding tube.   No x-ray is required. RN may begin using tube.   If the tube becomes dislodged please keep the tube and contact the Cortrak team at [email or web address] for replacement.  If after hours and replacement cannot be delayed, place a NG tube and confirm placement with an abdominal x-ray.    Augustin Shams MS, RD, LDN If unable to be reached, please send secure chat to RD inpatient available from 8:00a-4:00p daily

## 2024-07-11 NOTE — Progress Notes (Signed)
 [facility] notified of persisting HTN despite multiple doses of PRN Labetolol. Awaiting further orders.

## 2024-07-11 NOTE — Progress Notes (Signed)
 PHARMACY - ANTICOAGULATION CONSULT NOTE  Pharmacy Consult for Heparin  Indication: atrial fibrillation  No Known Allergies  Patient Measurements: Height: 5' 9 (175.3 cm) Weight: 77.1 kg (169 lb 15.6 oz) IBW/kg (Calculated) : 70.7 HEPARIN  DW (KG): 73.9  Vital Signs: Temp: 100.9 F (38.3 C) (09/02 0800) BP: 147/79 (09/02 0800) Pulse Rate: 56 (09/02 0800)  Labs: Recent Labs    07/08/24 1405 07/08/24 1405 07/09/24 0414 07/09/24 0919 07/10/24 0503 07/10/24 1700 07/11/24 0508  HGB  --    < > 14.7  --   --  12.6* 13.4  HCT  --   --  43.7  --   --  37.0* 40.8  PLT  --   --  216  --   --   --  189  APTT 77*  --  82*  --   --   --   --   HEPARINUNFRC 0.58  --   --  0.47 0.43  --  0.33  CREATININE  --   --  0.80  --  0.84  --  0.78   < > = values in this interval not displayed.    Estimated Creatinine Clearance: 85.9 mL/min (by C-G formula based on SCr of 0.78 mg/dL).   Assessment: 68 y.o. M on Eliquis  pta for afib. Holding for procedures - brain biopsy of tumor when stable. Last dose 8/28 0830.  Pharmacy consulted for heparin .    Heparin  level 0.33 is therapeutic on 1100 units/hr.  Goal of Therapy:  Heparin  level 0.3-0.7 units/ml aPTT 66-102 seconds Monitor platelets by anticoagulation protocol: Yes   Plan:  Continue heparin  gtt at 1100 units/hr Monitor daily heparin  level, CBC, signs/symptoms of bleeding  F/u timing of brain biopsy   Vermell Mccallum, PharmD CCM Pharmacy Resident  Please check AMION for all [facility] Pharmacy phone numbers After 10:00 PM, call Main Pharmacy [phone]

## 2024-07-11 NOTE — Procedures (Signed)
 Patient Name: Wayne Molina  MRN: [id number]  Epilepsy Attending: Arlin MALVA Krebs  Referring Physician/Provider: Khaliqdina, Salman, MD  Duration: 07/10/2024 9070 to 07/11/2024 9070   Patient history: 68 y.o. 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 EEG to evaluate for seizure   Level of alertness: awake/ lethargic , asleep   AEDs during EEG study: LEV   Technical aspects: This EEG study was done with scalp electrodes positioned according to the 10-20 International system of electrode placement. Electrical activity was reviewed with band pass filter of 1-70Hz , sensitivity of 7 uV/mm, display speed of 39mm/sec with a 60Hz  notched filter applied as appropriate. EEG data were recorded continuously and digitally stored.  Video monitoring was available and reviewed as appropriate.   Description: EEG showed continuous generalized and lateralized right hemisphere 3 to 6 Hz theta-delta slowing admixed with 13-15hz  beta activity. EEG also showed sharp waves in right hemisphere. Sleep was characterized by sleep spindles (12-14hz ), maximal fronto-central region. Hyperventilation and photic stimulation were not performed.      ABNORMALITY - Sharp waves  right hemisphere - Continuous slow, generalized and lateralized right hemisphere   IMPRESSION: This study showed evidence of epileptogenicity and cortical dysfunction arising from right hemisphere likely due to underlying structural abnormality. Additionally there is moderate to severe diffuse encephalopathy, likely related to sedation. No definite seizures were seen throughout the recording.    Milica Gully O Dewitt Judice

## 2024-07-11 NOTE — Progress Notes (Signed)
 LTM VIDEO EEG discontinued - no skin breakdown at [facility].

## 2024-07-11 NOTE — Progress Notes (Signed)
 SLP Cancellation Note  Patient Details Name: KNOWLEDGE ESCANDON MRN: [id number] DOB: [birth year]   Cancelled treatment:       Reason Eval/Treat Not Completed: Patient not medically ready Patient remains intubated and not following commands. SLP will continue to follow for readiness.  Norleen IVAR Blase, MA, CCC-SLP Speech Therapy

## 2024-07-11 NOTE — Procedures (Signed)
 Patient Name: Wayne Molina  MRN: [id number]  Epilepsy Attending: Arlin MALVA Krebs  Referring Physician/Provider: Khaliqdina, Salman, MD  Duration: 07/11/2024 9070 to 07/11/2024 1144   Patient history: 68 y.o. 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 EEG to evaluate for seizure   Level of alertness: awake/ lethargic , asleep   AEDs during EEG study: LEV   Technical aspects: This EEG study was done with scalp electrodes positioned according to the 10-20 International system of electrode placement. Electrical activity was reviewed with band pass filter of 1-70Hz , sensitivity of 7 uV/mm, display speed of 45mm/sec with a 60Hz  notched filter applied as appropriate. EEG data were recorded continuously and digitally stored.  Video monitoring was available and reviewed as appropriate.   Description: EEG showed continuous generalized and lateralized right hemisphere 3 to 6 Hz theta-delta slowing admixed with 13-15hz  beta activity. EEG also showed sharp waves in right hemisphere. Sleep was characterized by sleep spindles (12-14hz ), maximal fronto-central region. Hyperventilation and photic stimulation were not performed.      ABNORMALITY - Sharp waves  right hemisphere - Continuous slow, generalized and lateralized right hemisphere   IMPRESSION: This study showed evidence of epileptogenicity and cortical dysfunction arising from right hemisphere likely due to underlying structural abnormality. Additionally there is moderate to severe diffuse encephalopathy, likely related to sedation. No definite seizures were seen throughout the recording.    Lavonn Maxcy O Tarissa Kerin

## 2024-07-11 NOTE — Progress Notes (Signed)
 Subjective: No acute events overnight.  Tmax 100.8 Fahrenheit.  ROS: Unable to obtain due to poor mental status  Examination  Vital signs in last 24 hours: Temp:  [100.4 F (38 C)-101.5 F (38.6 C)] 100.8 F (38.2 C) (09/02 0900) Pulse Rate:  [46-134] 78 (09/02 0900) Resp:  [18-33] 33 (09/02 0900) BP: (102-169)/(57-108) 153/78 (09/02 0900) SpO2:  [76 %-100 %] 95 % (09/02 0900) FiO2 (%):  [40 %] 40 % (09/02 0403) Weight:  [77.1 kg] 77.1 kg (09/02 0458)  General: lying in bed, intubated Neuro: Does not open eyes to noxious stimulation, does not follow commands, PERRLA, corneal reflex intact, no forced gaze deviation, did not withdraw bilateral upper extremities for me, withdraws to noxious stimuli in bilateral lower extremities   Basic Metabolic Panel: Recent Labs  Lab 07/07/24 0003 07/07/24 0008 07/07/24 0602 07/07/24 1400 07/07/24 1446 07/09/24 0414 07/10/24 0503 07/10/24 1700 07/11/24 0508  NA 137 138 136 137  --  136 138 138 137  K 4.4 4.2 5.1 4.3  --  4.0 3.8 4.4 4.0  CL 102 102 103  --   --  107 107  --  105  CO2 25  --  23  --   --  18* 22  --  23  GLUCOSE 125* 117* 170*  --   --  119* 115*  --  145*  BUN 21 24* 20  --   --  16 20  --  18  CREATININE 1.02 1.00 1.08  --   --  0.80 0.84  --  0.78  CALCIUM  9.7  --  9.0  --   --  8.5* 8.1*  --  8.1*  MG  --   --   --   --  2.0 2.1 2.3  --  2.2  PHOS  --   --   --   --   --  2.5 2.4*  --  2.5    CBC: Recent Labs  Lab 07/07/24 0003 07/07/24 0008 07/07/24 0602 07/07/24 1400 07/08/24 0732 07/09/24 0414 07/10/24 1700 07/11/24 0508  WBC 8.2  --  13.4*  --  12.0* 11.7*  --  5.0  NEUTROABS 3.9  --   --   --   --   --   --   --   HGB 15.6   < > 15.5 12.9* 15.4 14.7 12.6* 13.4  HCT 46.8   < > 47.2 38.0* 48.2 43.7 37.0* 40.8  MCV 92.9  --  94.2  --  97.2 92.6  --  93.6  PLT 250  --  224  --  171 216  --  189   < > = values in this interval not displayed.     Coagulation Studies: No results for input(s):  LABPROT, INR in the last 72 hours.  Imaging No new brain imaging overnight  ASSESSMENT AND PLAN:68 y.o. 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  Will need to be off anticoagulation for about 7 days after biopsy.  Will consider bridging with aspirin  after 48 hours as long as no excessive bleeding after biopsy - DC LTM EEG as no seizures -As needed Versed  for clinical seizures - Please use Precedex  or  fentanyl  for his agitation or sedation -Discussed plan with ICU team and Dr Rosslyn -Neurology will continue to follow     I personally spent a total of 36 minutes in the care of the patient today including getting/reviewing separately obtained history, performing a medically appropriate exam/evaluation, counseling and educating, placing orders, referring and communicating with other health care professionals, documenting clinical information in the EHR, independently interpreting results, and coordinating care.        Wayne Molina Epilepsy [facility] For questions after 5pm please refer to AMION to reach the Neurologist on call

## 2024-07-12 DIAGNOSIS — Z789 Other specified health status: Secondary | ICD-10-CM | POA: Insufficient documentation

## 2024-07-12 DIAGNOSIS — Z87891 Personal history of nicotine dependence: Secondary | ICD-10-CM | POA: Insufficient documentation

## 2024-07-12 DIAGNOSIS — R569 Unspecified convulsions: Secondary | ICD-10-CM | POA: Diagnosis not present

## 2024-07-12 DIAGNOSIS — Z8781 Personal history of (healed) traumatic fracture: Secondary | ICD-10-CM | POA: Insufficient documentation

## 2024-07-12 DIAGNOSIS — G9389 Other specified disorders of brain: Secondary | ICD-10-CM | POA: Diagnosis not present

## 2024-07-12 DIAGNOSIS — I4891 Unspecified atrial fibrillation: Secondary | ICD-10-CM | POA: Diagnosis not present

## 2024-07-12 DIAGNOSIS — G9341 Metabolic encephalopathy: Secondary | ICD-10-CM | POA: Diagnosis not present

## 2024-07-12 DIAGNOSIS — D497 Neoplasm of unspecified behavior of endocrine glands and other parts of nervous system: Secondary | ICD-10-CM | POA: Diagnosis not present

## 2024-07-12 DIAGNOSIS — J15211 Pneumonia due to Methicillin susceptible Staphylococcus aureus: Secondary | ICD-10-CM | POA: Diagnosis not present

## 2024-07-12 LAB — CBC
HCT: 41.1 % (ref 39.0–52.0)
Hemoglobin: 13.5 g/dL (ref 13.0–17.0)
MCH: 30.7 pg (ref 26.0–34.0)
MCHC: 32.8 g/dL (ref 30.0–36.0)
MCV: 93.4 fL (ref 80.0–100.0)
Platelets: 198 K/uL (ref 150–400)
RBC: 4.4 MIL/uL (ref 4.22–5.81)
RDW: 12.6 % (ref 11.5–15.5)
WBC: 4.8 K/uL (ref 4.0–10.5)
nRBC: 0 % (ref 0.0–0.2)

## 2024-07-12 LAB — GLUCOSE, CAPILLARY
Glucose-Capillary: 152 mg/dL — ABNORMAL HIGH (ref 70–99)
Glucose-Capillary: 171 mg/dL — ABNORMAL HIGH (ref 70–99)
Glucose-Capillary: 194 mg/dL — ABNORMAL HIGH (ref 70–99)
Glucose-Capillary: 206 mg/dL — ABNORMAL HIGH (ref 70–99)
Glucose-Capillary: 224 mg/dL — ABNORMAL HIGH (ref 70–99)
Glucose-Capillary: 95 mg/dL (ref 70–99)

## 2024-07-12 LAB — BASIC METABOLIC PANEL WITH GFR
Anion gap: 11 (ref 5–15)
BUN: 21 mg/dL (ref 8–23)
CO2: 21 mmol/L — ABNORMAL LOW (ref 22–32)
Calcium: 8.3 mg/dL — ABNORMAL LOW (ref 8.9–10.3)
Chloride: 110 mmol/L (ref 98–111)
Creatinine, Ser: 0.91 mg/dL (ref 0.61–1.24)
GFR, Estimated: >60 mL/min (ref >60)
Glucose, Bld: 251 mg/dL — ABNORMAL HIGH (ref 70–99)
Potassium: 4.4 mmol/L (ref 3.5–5.1)
Sodium: 142 mmol/L (ref 135–145)

## 2024-07-12 LAB — MAGNESIUM: Magnesium: 2.2 mg/dL (ref 1.7–2.4)

## 2024-07-12 LAB — HEPARIN LEVEL (UNFRACTIONATED): Heparin Unfractionated: 0.34 [IU]/mL (ref 0.30–0.70)

## 2024-07-12 LAB — SURGICAL PCR SCREEN
MRSA, PCR: NEGATIVE
Staphylococcus aureus: POSITIVE — AB

## 2024-07-12 LAB — PHOSPHORUS: Phosphorus: 2.6 mg/dL (ref 2.5–4.6)

## 2024-07-12 LAB — PROCALCITONIN: Procalcitonin: 0.29 ng/mL

## 2024-07-12 MED ORDER — SODIUM CHLORIDE 0.9 % IV SOLN
2.0000 g | INTRAVENOUS | Status: DC
  Administered 2024-07-12: 2 g via INTRAVENOUS
  Filled 2024-07-12: qty 20

## 2024-07-12 MED ORDER — MUPIROCIN 2 % EX OINT
1.0000 | TOPICAL_OINTMENT | Freq: Two times a day (BID) | CUTANEOUS | Status: AC
  Administered 2024-07-12 – 2024-07-16 (×9): 1 via NASAL
  Filled 2024-07-12 (×2): qty 22

## 2024-07-12 MED ORDER — LEVETIRACETAM (KEPPRA) 500 MG/5 ML ADULT IV PUSH
1250.0000 mg | Freq: Two times a day (BID) | INTRAVENOUS | Status: DC
  Administered 2024-07-12 – 2024-07-16 (×8): 1250 mg via INTRAVENOUS
  Filled 2024-07-12 (×8): qty 15

## 2024-07-12 NOTE — Progress Notes (Addendum)
 Subjective: Tmax 102.40F.  Per wife at bedside, patient is waking up today, still not following commands well.  ROS: Unable to obtain due to poor mental status  Examination  Vital signs in last 24 hours: Temp:  [100.3 F (37.9 C)-102.6 F (39.2 C)] 100.8 F (38.2 C) (09/03 0700) Pulse Rate:  [69-99] 71 (09/03 0700) Resp:  [15-35] 19 (09/03 0700) BP: (143-182)/(68-101) 151/89 (09/03 0700) SpO2:  [96 %-100 %] 98 % (09/03 0700) FiO2 (%):  [40 %] 40 % (09/03 0401) Weight:  [80.6 kg] 80.6 kg (09/03 0500)  General: lying in bed, intubated Neuro: Opens eyes to verbal stimulation, tracks examiner in room briefly, does not follow commands, antigravity strength in all extremities  Basic Metabolic Panel: Recent Labs  Lab 07/07/24 0602 07/07/24 1400 07/07/24 1446 07/09/24 0414 07/10/24 0503 07/10/24 1700 07/11/24 0508 07/12/24 0337  NA 136   < >  --  136 138 138 137 142  K 5.1   < >  --  4.0 3.8 4.4 4.0 4.4  CL 103  --   --  107 107  --  105 110  CO2 23  --   --  18* 22  --  23 21*  GLUCOSE 170*  --   --  119* 115*  --  145* 251*  BUN 20  --   --  16 20  --  18 21  CREATININE 1.08  --   --  0.80 0.84  --  0.78 0.91  CALCIUM  9.0  --   --  8.5* 8.1*  --  8.1* 8.3*  MG  --   --  2.0 2.1 2.3  --  2.2 2.2  PHOS  --   --   --  2.5 2.4*  --  2.5 2.6   < > = values in this interval not displayed.    CBC: Recent Labs  Lab 07/07/24 0003 07/07/24 0008 07/07/24 0602 07/07/24 1400 07/08/24 0732 07/09/24 0414 07/10/24 1700 07/11/24 0508 07/12/24 0337  WBC 8.2  --  13.4*  --  12.0* 11.7*  --  5.0 4.8  NEUTROABS 3.9  --   --   --   --   --   --   --   --   HGB 15.6   < > 15.5   < > 15.4 14.7 12.6* 13.4 13.5  HCT 46.8   < > 47.2   < > 48.2 43.7 37.0* 40.8 41.1  MCV 92.9  --  94.2  --  97.2 92.6  --  93.6 93.4  PLT 250  --  224  --  171 216  --  189 198   < > = values in this interval not displayed.    Coagulation Studies: No results for input(s): LABPROT, INR in the last 72  hours.  Imaging No new brain imaging overnight   ASSESSMENT AND PLAN:68 y.o. 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  Will need to be off anticoagulation for about 7 days after biopsy.  Will consider bridging with aspirin  after 48 hours as long as no excessive bleeding after biopsy -As needed Versed  for clinical seizures - Please use Precedex  or fentanyl  for  his agitation or sedation -Discussed plan with ICU team and Dr Rosslyn -Discussed plan with wife at bedside -Neurology will continue to follow     I personally spent a total of 38 minutes in the care of the patient today including getting/reviewing separately obtained history, performing a medically appropriate exam/evaluation, counseling and educating, placing orders, referring and communicating with other health care professionals, documenting clinical information in the EHR, independently interpreting results, and coordinating care.        Wayne Molina Epilepsy [facility] For questions after 5pm please refer to AMION to reach the Neurologist on call

## 2024-07-12 NOTE — Progress Notes (Signed)
 NAME:  Wayne Molina, MRN:  [id number], DOB:  11-18-1955, LOS: 5 ADMISSION DATE:  07/06/2024, CONSULTATION DATE:  07/07/2024 REFERRING MD:  SHIRLEYANN, CHIEF COMPLAINT:  AMS   History of Present Illness:  68 year old man who presented to [facility] 8/28 with AMS, Code Stroke. PMHx significant for HTN, HLD, Afib on Eliquis , untreated OSA.  Patient presented to ER as a Code Stroke after coworker noticed he was altered since 8/28 around 2230 and pale, patient reported feeling dizzy.  Wife reports pt has been in his normal state of health, just recently not sleeping well for the last week and fatigued.  Nonsmoker, wife denies ETOH use. 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  In ER, noted to have L sided weakness and left hemianopsia.  Last dose Eliquis  reported 8/28.  Code Stroke activated.  CT Head initially concerning for right posterior MCA stroke but CTA negative for LVO.  Also noted to have rhythmic eye twitching, SBP 200s, and more lethargic, oriented to person only.  Repeat CT Head concerning for mass. EEG started and Keppra  load given. FPTS admitted.  Patient was given a total of 4mg  Ativan  for MRI, seizure concern, and intermittent agitation.  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 NSGY was consulted with plan for eventual biopsy.    PCCM consulted for ongoing encephalopathy and concern for airway protection. Intubated 8/29.  Pertinent Medical History:   Past Medical History:  Diagnosis Date   Anxiety    Arthritis    fingers   Chronic atrial fibrillation (HCC)    Depression    Family history of breast cancer    Family history of cancer of male genital organ    Family history of gene mutation    BRIP1   Family history of malignant neoplasm of  gastrointestinal tract    Hyperlipidemia    Hypertension    Personal history of colonic polyps 11/12/2006   Sleep apnea    Significant Hospital Events: Including procedures, antibiotic start and stop dates in addition to other pertinent events   8/28 Admitted seizures. LTM initiated .  8/29 PCCM consulted. Intubated for AW protection. CT Head with enhancing mass (bilobed), with dominant components measuring 2.9 cm and 3.7 cm at the right temporal lobe and splenium respectively. Surrounding vasogenic edema without midline shift. MRI brain Positive for relatively large and infiltrative tumor with heterogeneous enhancement tracking from the splenium of the corpus callosum near midline into the right temporal lobe. Confluent regional tumoral edema. 8/30 Evidence of epileptogenicity and cortical dysfunction arising from right hemisphere likely due to underlying structural abnormality. Seen by neuro surg w/ plan for Bx after off vent and stable 8/31 1-2 Different episodes of jerking concerning for seizures.  None seen on EEG.  Midazolam  titration down.  Added Zosyn  for fever 9/1 Stopping Versed .  Weaning some. Sputum culture growing staph started on vancomycin  until sensitivities back 9/2 Resp Cx with abundant staph aureus, abundant H. Flu. Abx transitioned to Unasyn . 9/3 Some issues with hypertension overnight, remains on vent with fent and precedex    Interim History/Subjective:  Sedated on vent   Objective:   Blood pressure (!) 151/89, pulse 71, temperature (!) 100.8 F (38.2 C), resp. rate 19, height 5' 9 (1.753 m), weight 80.6 kg, SpO2 98%. CVP:  [12 mmHg-21 mmHg] 14 mmHg  Vent  Mode: PRVC FiO2 (%):  [40 %] 40 % Set Rate:  [18 bmp] 18 bmp Vt Set:  [560 mL] 560 mL PEEP:  [5 cmH20] 5 cmH20 Pressure Support:  [12 cmH20] 12 cmH20 Plateau Pressure:  [18 cmH20-19 cmH20] 18 cmH20   Intake/Output Summary (Last 24 hours) at 07/12/2024 0733 Last data filed at 07/12/2024 0700 Gross per 24 hour   Intake 2843.07 ml  Output 1320 ml  Net 1523.07 ml   Filed Weights   07/10/24 0500 07/11/24 0458 07/12/24 0500  Weight: 80.4 kg 77.1 kg 80.6 kg   Physical Examination: General: Acute on chronic ill-appearing elderly male lying in bed on mechanical ventilation no acute distress HEENT: ETT, MM pink/moist, PERRL,  Neuro: Sedated on ventilator CV: s1s2 regular rate and rhythm, no murmur, rubs, or gallops,  PULM: Slight rhonchi bilaterally with auscultated faint cuff leak, no increased work of breathing, tolerating ventilator GI: soft, bowel sounds active in all 4 quadrants, non-tender, non-distended, tolerating TF Extremities: warm/dry, no edema  Skin: no rashes or lesions  Resolved Problem List:  Status epilepticus resolved as of 8/31 Abnormal LFTs - normalized 9/2 Fluid and electrolyte imbalance & acid base imbalance  hypophosphatemia, NAGMA -Bicarb gtt from 23 to 18 on 8/31. Not hyperchloremic.  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 Janjua 9/4  Ventilator management  -S/p intubation for airway protection. P: Continue ventilator support with lung protective strategies  Wean PEEP and FiO2 for sats greater than 90%. Head of bed elevated 30 degrees. Plateau pressures less than 30 cm H20.  Follow intermittent chest x-ray and ABG.   SAT/SBT as tolerated, mentation preclude extubation  Ensure adequate pulmonary hygiene  Follow cultures  VAP bundle in place  PAD protocol  Staph aureus pneumonia -Still spiking fevers, Started on antibiotics 8/31, MRSA negative. Resp Cx 8/31 with abundant staph  aureus, abundant H. Flu. P: Continue Unasyn  Trend CBC and fever curve Antipyretics for fever control  AFib on Eliquis  -Last dose 8/28. P: Continuous telemetry Optimize electrolytes Continue heparin  drip until biopsy completed tentative plan to bridge with aspirin  for 48 hours after biopsy as long as bleeding is controlled.  Full anticoagulation will need to be held 7 days postbiopsy  HTN HLD P: Home antihypertensives remain on hold  Hyperglycemia -A1c 6.0. P: Continue SSI CBG goal 140-180 CBG checks every 4  Critical care time:   CRITICAL CARE Performed by: Lillianne Eick D. Harris   Total critical care time: 38 minutes  Critical care time was exclusive of separately billable procedures and treating other patients.  Critical care was necessary to treat or prevent imminent or life-threatening deterioration.  Critical care was time spent personally by me on the following activities: development of treatment plan with patient and/or surrogate as well as nursing, discussions with consultants, evaluation of patient's response to treatment, examination of patient, obtaining history from patient or surrogate, ordering and performing treatments and interventions, ordering and review of laboratory studies, ordering and review of radiographic studies, pulse oximetry and re-evaluation of patient's condition.  Brilee Port D. Harris, NP-C [facility] Personal contact information can be found on Amion  If no contact or response made please call [phone] 07/12/2024,  7:37 AM

## 2024-07-12 NOTE — Progress Notes (Signed)
 PHARMACY - ANTICOAGULATION CONSULT NOTE  Pharmacy Consult for Heparin  Indication: atrial fibrillation  No Known Allergies  Patient Measurements: Height: 5' 9 (175.3 cm) Weight: 80.6 kg (177 lb 11.1 oz) IBW/kg (Calculated) : 70.7 HEPARIN  DW (KG): 73.9  Vital Signs: Temp: 100.8 F (38.2 C) (09/03 0700) Temp Source: Esophageal (09/03 0414) BP: 151/89 (09/03 0700) Pulse Rate: 71 (09/03 0700)  Labs: Recent Labs    07/10/24 0503 07/10/24 1700 07/10/24 1700 07/11/24 0508 07/12/24 0337  HGB  --  12.6*   < > 13.4 13.5  HCT  --  37.0*  --  40.8 41.1  PLT  --   --   --  189 198  HEPARINUNFRC 0.43  --   --  0.33 0.34  CREATININE 0.84  --   --  0.78 0.91   < > = values in this interval not displayed.    Estimated Creatinine Clearance: 75.5 mL/min (by C-G formula based on SCr of 0.91 mg/dL).   Assessment: 68 y.o. M on Eliquis  pta for afib. Holding for procedures - brain biopsy of tumor when stable. Last dose 8/28 0830.  Pharmacy consulted for heparin .    Heparin  level 0.34 is therapeutic on 1100 units/hr.  Goal of Therapy:  Heparin  level 0.3-0.7 units/ml aPTT 66-102 seconds Monitor platelets by anticoagulation protocol: Yes   Plan:  Continue heparin  gtt at 1100 units/hr Monitor daily heparin  level, CBC, signs/symptoms of bleeding  F/u timing of brain biopsy   Vermell Mccallum, PharmD CCM Pharmacy Resident  Please check AMION for all [facility] Pharmacy phone numbers After 10:00 PM, call Main Pharmacy [phone]

## 2024-07-12 NOTE — Plan of Care (Signed)

## 2024-07-13 ENCOUNTER — Inpatient Hospital Stay (HOSPITAL_COMMUNITY)

## 2024-07-13 DIAGNOSIS — G9389 Other specified disorders of brain: Secondary | ICD-10-CM | POA: Diagnosis not present

## 2024-07-13 DIAGNOSIS — J15211 Pneumonia due to Methicillin susceptible Staphylococcus aureus: Secondary | ICD-10-CM | POA: Diagnosis not present

## 2024-07-13 DIAGNOSIS — D497 Neoplasm of unspecified behavior of endocrine glands and other parts of nervous system: Secondary | ICD-10-CM | POA: Diagnosis not present

## 2024-07-13 DIAGNOSIS — R739 Hyperglycemia, unspecified: Secondary | ICD-10-CM | POA: Diagnosis not present

## 2024-07-13 DIAGNOSIS — G9341 Metabolic encephalopathy: Secondary | ICD-10-CM | POA: Diagnosis not present

## 2024-07-13 DIAGNOSIS — R569 Unspecified convulsions: Secondary | ICD-10-CM | POA: Diagnosis not present

## 2024-07-13 LAB — BASIC METABOLIC PANEL WITH GFR
Anion gap: 10 (ref 5–15)
BUN: 22 mg/dL (ref 8–23)
CO2: 22 mmol/L (ref 22–32)
Calcium: 8.1 mg/dL — ABNORMAL LOW (ref 8.9–10.3)
Chloride: 111 mmol/L (ref 98–111)
Creatinine, Ser: 0.7 mg/dL (ref 0.61–1.24)
GFR, Estimated: >60 mL/min (ref >60)
Glucose, Bld: 158 mg/dL — ABNORMAL HIGH (ref 70–99)
Potassium: 4.1 mmol/L (ref 3.5–5.1)
Sodium: 143 mmol/L (ref 135–145)

## 2024-07-13 LAB — GLUCOSE, CAPILLARY
Glucose-Capillary: 137 mg/dL — ABNORMAL HIGH (ref 70–99)
Glucose-Capillary: 124 mg/dL — ABNORMAL HIGH (ref 70–99)
Glucose-Capillary: 102 mg/dL — ABNORMAL HIGH (ref 70–99)
Glucose-Capillary: 169 mg/dL — ABNORMAL HIGH (ref 70–99)
Glucose-Capillary: 156 mg/dL — ABNORMAL HIGH (ref 70–99)
Glucose-Capillary: 189 mg/dL — ABNORMAL HIGH (ref 70–99)

## 2024-07-13 LAB — CBC
HCT: 39.1 % (ref 39.0–52.0)
Hemoglobin: 12.6 g/dL — ABNORMAL LOW (ref 13.0–17.0)
MCH: 30.7 pg (ref 26.0–34.0)
MCHC: 32.2 g/dL (ref 30.0–36.0)
MCV: 95.1 fL (ref 80.0–100.0)
Platelets: 201 K/uL (ref 150–400)
RBC: 4.11 MIL/uL — ABNORMAL LOW (ref 4.22–5.81)
RDW: 13.1 % (ref 11.5–15.5)
WBC: 4.8 K/uL (ref 4.0–10.5)
nRBC: 0 % (ref 0.0–0.2)

## 2024-07-13 LAB — PROCALCITONIN: Procalcitonin: 0.21 ng/mL

## 2024-07-13 LAB — HEPARIN LEVEL (UNFRACTIONATED): Heparin Unfractionated: 0.3 [IU]/mL (ref 0.30–0.70)

## 2024-07-13 MED ORDER — ORAL CARE MOUTH RINSE: 15.0000 mL | OROMUCOSAL | Status: DC | PRN

## 2024-07-13 MED ORDER — PROPOFOL 1000 MG/100ML IV EMUL
0.0000 ug/kg/min | INTRAVENOUS | Status: DC
  Administered 2024-07-13: 10 ug/kg/min via INTRAVENOUS
  Filled 2024-07-13: qty 100

## 2024-07-13 MED ORDER — HALOPERIDOL LACTATE 5 MG/ML IJ SOLN
5.0000 mg | Freq: Once | INTRAMUSCULAR | Status: AC
  Administered 2024-07-13: 5 mg via INTRAVENOUS
  Filled 2024-07-13: qty 1

## 2024-07-13 MED ORDER — SODIUM CHLORIDE 3 % IN NEBU
4.0000 mL | INHALATION_SOLUTION | Freq: Four times a day (QID) | RESPIRATORY_TRACT | Status: AC
  Administered 2024-07-13 – 2024-07-16 (×12): 4 mL via RESPIRATORY_TRACT
  Filled 2024-07-13 (×12): qty 4

## 2024-07-13 MED ORDER — QUETIAPINE FUMARATE 25 MG PO TABS
25.0000 mg | ORAL_TABLET | Freq: Two times a day (BID) | ORAL | Status: DC
  Administered 2024-07-13 – 2024-07-16 (×8): 25 mg
  Filled 2024-07-13 (×9): qty 1

## 2024-07-13 MED ORDER — GUAIFENESIN 100 MG/5ML PO LIQD
15.0000 mL | Freq: Four times a day (QID) | ORAL | Status: DC
  Administered 2024-07-13 – 2024-07-15 (×7): 15 mL via ORAL
  Filled 2024-07-13 (×7): qty 15

## 2024-07-13 MED ORDER — HALOPERIDOL LACTATE 5 MG/ML IJ SOLN
5.0000 mg | Freq: Four times a day (QID) | INTRAMUSCULAR | Status: DC | PRN
  Administered 2024-07-13: 5 mg via INTRAVENOUS
  Filled 2024-07-13 (×2): qty 1

## 2024-07-13 MED ORDER — POLYETHYLENE GLYCOL 3350 17 G PO PACK: 17.0000 g | PACK | Freq: Every day | ORAL | Status: DC

## 2024-07-13 MED ORDER — ALBUTEROL SULFATE (2.5 MG/3ML) 0.083% IN NEBU
2.5000 mg | INHALATION_SOLUTION | Freq: Four times a day (QID) | RESPIRATORY_TRACT | Status: DC | PRN
  Administered 2024-07-13: 2.5 mg via RESPIRATORY_TRACT
  Filled 2024-07-13: qty 3

## 2024-07-13 MED ORDER — BANATROL TF EN LIQD
60.0000 mL | Freq: Two times a day (BID) | ENTERAL | Status: DC
  Administered 2024-07-13 – 2024-07-24 (×21): 60 mL
  Filled 2024-07-13 (×21): qty 60

## 2024-07-13 MED ORDER — HYDRALAZINE HCL 50 MG PO TABS
50.0000 mg | ORAL_TABLET | Freq: Four times a day (QID) | ORAL | Status: DC
  Administered 2024-07-13 – 2024-07-16 (×14): 50 mg
  Filled 2024-07-13 (×14): qty 1

## 2024-07-13 MED ORDER — CEFAZOLIN SODIUM-DEXTROSE 2-4 GM/100ML-% IV SOLN
2.0000 g | Freq: Three times a day (TID) | INTRAVENOUS | Status: AC
  Administered 2024-07-13 – 2024-07-17 (×13): 2 g via INTRAVENOUS
  Filled 2024-07-13 (×13): qty 100

## 2024-07-13 MED ORDER — POTASSIUM CHLORIDE 20 MEQ PO PACK
40.0000 meq | PACK | Freq: Once | ORAL | Status: AC
  Administered 2024-07-13: 40 meq
  Filled 2024-07-13: qty 2

## 2024-07-13 MED ORDER — FUROSEMIDE 10 MG/ML IJ SOLN
40.0000 mg | Freq: Once | INTRAMUSCULAR | Status: AC
  Administered 2024-07-13: 40 mg via INTRAVENOUS
  Filled 2024-07-13: qty 4

## 2024-07-13 MED ORDER — ORAL CARE MOUTH RINSE
15.0000 mL | OROMUCOSAL | Status: DC
  Administered 2024-07-13 – 2024-08-04 (×82): 15 mL via OROMUCOSAL

## 2024-07-13 MED ORDER — DOCUSATE SODIUM 50 MG/5ML PO LIQD
100.0000 mg | Freq: Two times a day (BID) | ORAL | Status: DC
Start: 2024-07-13 — End: 2024-07-13
  Administered 2024-07-13: 100 mg
  Filled 2024-07-13 (×2): qty 10

## 2024-07-13 MED ORDER — ALBUTEROL SULFATE (2.5 MG/3ML) 0.083% IN NEBU
2.5000 mg | INHALATION_SOLUTION | Freq: Four times a day (QID) | RESPIRATORY_TRACT | Status: DC
  Administered 2024-07-13 – 2024-07-17 (×15): 2.5 mg via RESPIRATORY_TRACT
  Filled 2024-07-13 (×16): qty 3

## 2024-07-13 NOTE — Progress Notes (Addendum)
 NAME:  Wayne Molina, MRN:  [id number], DOB:  06-22-1956, LOS: 6 ADMISSION DATE:  07/06/2024, CONSULTATION DATE:  07/07/2024 REFERRING MD:  FPTS, CHIEF COMPLAINT:  AMS   History of Present Illness:  68 year old man who presented to [facility] 8/28 with AMS, Code Stroke. PMHx significant for HTN, HLD, Afib on Eliquis , untreated OSA.  Patient presented to ER as a Code Stroke after coworker noticed he was altered since 8/28 around 2230 and pale, patient reported feeling dizzy.  Wife reports pt has been in his normal state of health, just recently not sleeping well for the last week and fatigued.  Nonsmoker, wife denies ETOH use. 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  In ER, noted to have L sided weakness and left hemianopsia.  Last dose Eliquis  reported 8/28.  Code Stroke activated.  CT Head initially concerning for right posterior MCA stroke but CTA negative for LVO.  Also noted to have rhythmic eye twitching, SBP 200s, and more lethargic, oriented to person only.  Repeat CT Head concerning for mass. EEG started and Keppra  load given. FPTS admitted.  Patient was given a total of 4mg  Ativan  for MRI, seizure concern, and intermittent agitation.  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 NSGY was consulted with plan for eventual biopsy.    PCCM consulted for ongoing encephalopathy and concern for airway protection. Intubated 8/29.  Pertinent Medical History:   Past Medical History:  Diagnosis Date   Anxiety    Arthritis    fingers   Chronic atrial fibrillation (HCC)    Depression    Family history of breast cancer    Family history of cancer of male genital organ    Family history of gene mutation    BRIP1   Family history of malignant neoplasm of  gastrointestinal tract    Hyperlipidemia    Hypertension    Personal history of colonic polyps 11/12/2006   Sleep apnea    Significant Hospital Events: Including procedures, antibiotic start and stop dates in addition to other pertinent events   8/28 Admitted seizures. LTM initiated .  8/29 PCCM consulted. Intubated for AW protection. CT Head with enhancing mass (bilobed), with dominant components measuring 2.9 cm and 3.7 cm at the right temporal lobe and splenium respectively. Surrounding vasogenic edema without midline shift. MRI brain Positive for relatively large and infiltrative tumor with heterogeneous enhancement tracking from the splenium of the corpus callosum near midline into the right temporal lobe. Confluent regional tumoral edema. 8/30 Evidence of epileptogenicity and cortical dysfunction arising from right hemisphere likely due to underlying structural abnormality. Seen by neuro surg w/ plan for Bx after off vent and stable 8/31 1-2 Different episodes of jerking concerning for seizures.  None seen on EEG.  Midazolam  titration down.  Added Zosyn  for fever 9/1 Stopping Versed .  Weaning some. Sputum culture growing staph started on vancomycin  until sensitivities back 9/2 Resp Cx with abundant staph aureus, abundant H. Flu. Abx transitioned to Unasyn . 9/3 Some issues with hypertension overnight, remains on vent with fent and precedex  this Precedex  was stopped given concern for fever. 9/4 antibiotics changed to Ancef  to cover both staph and H. influenzae with plan to complete total of 7 days.  Extubating  Interim History/Subjective:  Awake, localizes, will not follow commands.  He is a little agitated off  from sedation  Objective:   Blood pressure 134/79, pulse 67, temperature 98.9 F (37.2 C), temperature source Esophageal, resp. rate 18, height 5' 9 (1.753 m), weight 81.8 kg, SpO2 96%.    Vent Mode: PRVC FiO2 (%):  [40 %] 40 % Set Rate:  [18 bmp] 18 bmp Vt Set:  [560 mL] 560  mL PEEP:  [5 cmH20] 5 cmH20 Pressure Support:  [0 cmH20-12 cmH20] 0 cmH20 Plateau Pressure:  [15 cmH20] 15 cmH20   Intake/Output Summary (Last 24 hours) at 07/13/2024 0731 Last data filed at 07/13/2024 0600 Gross per 24 hour  Intake 2385.73 ml  Output 1450 ml  Net 935.73 ml   Filed Weights   07/11/24 0458 07/12/24 0500 07/13/24 0324  Weight: 77.1 kg 80.6 kg 81.8 kg   Physical Examination: General this is a 68 year old white male he is currently on pressure support ventilation, he is agitated with sedation off, moving all extremities equally HEENT normal cephalic atraumatic orally intubated no JVD pupils are equal reactive Neuro he is awake agitated moving all extremities equally, not following commands Pulmonary some coarse scattered rhonchi, on pressure support 0 PEEP 5 tidal volumes in the 700 range, F/VT well within safety values in the 60-70 range. Cardiac: Currently regular irregular.  Atrial fibrillation with controlled ventricular rate Extremities warm dry brisk cap refill no significant edema GU clear yellow Abdomen soft, he does have liquid stool from Flexi-Seal  Resolved Problem List:  Status epilepticus resolved as of 8/31 Abnormal LFTs - normalized 9/2 Fluid and electrolyte imbalance & acid base imbalance  hypophosphatemia, NAGMA -Bicarb gtt from 23 to 18 on 8/31. Not hyperchloremic.  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 Janjua 9/4  Ventilator management  -S/p intubation for airway protection.  His primary barrier to extubation has been his mental status.  Currently extubation  parameters acceptable  Plan Extubate to nasal cannula Wean FiO2 for saturations greater than 92% Head of bed elevated N.p.o. Reflux precautions A.m. chest x-ray Close observation for need for reintubation   Staph aureus and H Flu pneumonia -Still spiking fevers, Started on antibiotics 8/31, MRSA negative. Resp Cx 8/31 with abundant staph aureus, abundant H. Flu.today is day 5  abx, narrowed to CTX. Hypothetically should cover for SA but not ideal with higher failure rate.  Plan Changing ceftriaxone  to Ancef , will complete 2 more days or total of 7 days, pharmacy placing stop date  AFib on Eliquis  -Last dose 8/28 on hold for pending bx Olan Continuous telemetry Optimize electrolytes Continue heparin  drip until biopsy completed tentative plan to bridge with aspirin  for 48 hours after biopsy as long as bleeding is controlled.  Full anticoagulation will need to be held 7 days postbiopsy  HTN HLD Plan: Hydralazine  25mg  vt q6 PRN labetolol SBP > 160 Cont Lipitor at 40mg  vt daily   Hyperglycemia -A1c 6.0. excellent glycemic control on current regimen Plan Continue SSI q 4 CBG goal 140-180   Critical care time: 42 min  CRITICAL CARE Performed by: Jeralyn FORBES Banner

## 2024-07-13 NOTE — Progress Notes (Signed)
 Ema, RN and MD notified of increasing temps with increasing rate of Precedex . Patient becomes RASS of 2,3 when Precedex  is lower and prns have been given. Orders placed to start Propofol  and attempt to decrease Precedex  gtt were given. Cooling blanket ordered for increasing temperatures.

## 2024-07-13 NOTE — Progress Notes (Signed)
 RT called to bedside due to pt having tachypnea and an increase in O2 requirements. Pt states he is having some trouble breathing as well. Pt placed on HHFNC per order and a breathing tx was given. RT will continue to monitor as needed.

## 2024-07-13 NOTE — Progress Notes (Signed)
 eLink Physician-Brief Progress Note Patient Name: Wayne Molina DOB: 07/24/1956 MRN: [id number]   Date of Service  07/13/2024  HPI/Events of Note  Goal for primary team was to wean off Precedex  with concern for drug-induced fevers.  No alternative sedation ordered, minimal response to as needed Versed .  Febrile to 102.  eICU Interventions  Cooling blanket  Initiate propofol  as Precedex  is weaned off        [name] 07/13/2024, 1:37 AM

## 2024-07-13 NOTE — Progress Notes (Signed)
 PRN GIVEN

## 2024-07-13 NOTE — Progress Notes (Signed)
 More agitated Requiring increased FIO2 now on HHNC Plan Cont HHFNC Adding resp hygiene  Getting CXR Lasix  X 1 Adding neb and HTNS  High risk for re-intubation

## 2024-07-13 NOTE — Significant Event (Signed)
 Worsening hypoxia post extubation. Currently on HFNC FiO2 55%.   On exam w rales/rhonchi in lung bases.  Mildly tachypneic.  Good cough but pools the expectorated phlegm in mouth, needing suction.   PLAN: Thick secretions: will give scheduled alb+3% saline nebs q6h Guafenesin cough syrup CXR now Trial of lasix  x 1.

## 2024-07-13 NOTE — Progress Notes (Signed)
 Subjective: Tmax 102F. Extubated this morning. Fever curve improving  ROS: Unable to obtain due to poor mental status  Examination  Vital signs in last 24 hours: Temp:  [98.5 F (36.9 C)-102 F (38.9 C)] 98.9 F (37.2 C) (09/04 0600) Pulse Rate:  [67-88] 67 (09/04 0600) Resp:  [17-34] 18 (09/04 0600) BP: (123-167)/(72-108) 134/79 (09/04 0600) SpO2:  [93 %-100 %] 96 % (09/04 0600) FiO2 (%):  [40 %] 40 % (09/04 0820) Weight:  [81.8 kg] 81.8 kg (09/04 0324)  General: lying in bed, intermittently agitated Neuro: awake, alert, able to tell me his name, follows commands, left sided neglect, antigravity strength in all extremities  Basic Metabolic Panel: Recent Labs  Lab 07/07/24 1446 07/09/24 0414 07/10/24 0503 07/10/24 1700 07/11/24 0508 07/12/24 0337 07/13/24 0450  NA  --  136 138 138 137 142 143  K  --  4.0 3.8 4.4 4.0 4.4 4.1  CL  --  107 107  --  105 110 111  CO2  --  18* 22  --  23 21* 22  GLUCOSE  --  119* 115*  --  145* 251* 158*  BUN  --  16 20  --  18 21 22   CREATININE  --  0.80 0.84  --  0.78 0.91 0.70  CALCIUM   --  8.5* 8.1*  --  8.1* 8.3* 8.1*  MG 2.0 2.1 2.3  --  2.2 2.2  --   PHOS  --  2.5 2.4*  --  2.5 2.6  --     CBC: Recent Labs  Lab 07/07/24 0003 07/07/24 0008 07/08/24 0732 07/09/24 0414 07/10/24 1700 07/11/24 0508 07/12/24 0337 07/13/24 0450  WBC 8.2   < > 12.0* 11.7*  --  5.0 4.8 4.8  NEUTROABS 3.9  --   --   --   --   --   --   --   HGB 15.6   < > 15.4 14.7 12.6* 13.4 13.5 12.6*  HCT 46.8   < > 48.2 43.7 37.0* 40.8 41.1 39.1  MCV 92.9   < > 97.2 92.6  --  93.6 93.4 95.1  PLT 250   < > 171 216  --  189 198 201   < > = values in this interval not displayed.     Coagulation Studies: No results for input(s): LABPROT, INR in the last 72 hours.  Imaging No new brain imaging overnight   ASSESSMENT AND PLAN:68 y.o. 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  Will need to be off anticoagulation for about 7 days after biopsy.  Will consider bridging with aspirin  after 48 hours as long as no excessive bleeding after biopsy -As needed Versed  for clinical seizures -Discussed plan with brother and his wife at bedside -Neurology will continue to follow     I personally spent a total of 27 minutes in the care of the patient today including getting/reviewing separately obtained history, performing a medically appropriate exam/evaluation, counseling and educating, placing orders, referring and communicating with other health care professionals, documenting clinical information in the EHR, independently interpreting results, and coordinating care.      Arlin Krebs Epilepsy [facility] For  questions after 5pm please refer to AMION to reach the Neurologist on call

## 2024-07-13 NOTE — Procedures (Signed)
 Extubation Procedure Note  Patient Details:   Name: Wayne Molina DOB: December 03, 1955 MRN: [id number]   Airway Documentation:    Vent end date: 07/13/24 Vent end time: 0838   Evaluation  O2 sats: stable throughout Complications: No apparent complications Patient did tolerate procedure well. Bilateral Breath Sounds: Diminished, Rhonchi   Yes  Pt extubated per order to 4L [place]. Pt had a positive cuff leak, able to state name, a weak cough and no stridor was noted.   Shan LITTIE Collum 07/13/2024, 8:47 AM

## 2024-07-13 NOTE — Progress Notes (Signed)
 SLP Cancellation Note  Patient Details Name: Wayne Molina MRN: [id number] DOB: 08-22-1956   Cancelled treatment:       Reason Eval/Treat Not Completed: Patient not medically ready given report of difficulty breathing. Will f/u for readiness.    Leilyn Frayre, Consuelo Fitch 07/13/2024, 2:54 PM

## 2024-07-13 NOTE — Progress Notes (Signed)
 IVT consult placed to remove PICC. Patient has periods of agitation and confusion - requested to keep PICC in place to ensure reliable access until more stable. Expressed concern that if PICC is removed and replaced with peripheral that patient may accidentally remove it. PICC is higher up and less accessible for patient removal. Maude Banner, NP receptive to recommendation and gave the 'ok' for PICC to remain in place.   Jakyrah Holladay R Elizar Alpern, RN

## 2024-07-13 NOTE — Progress Notes (Signed)
 Nutrition Follow-up  DOCUMENTATION CODES:   Non-severe (moderate) malnutrition in context of chronic illness  INTERVENTION:   Continue tube feeding via Cortrak: - Osmolite 1.5 @ 50 mL/hr (1200 mL/day) - PROSource TF20 60 mL daily   Tube feeding regimen provides 1880 kcal, 95 grams of protein, and 914 ml of H2O.  Add Banatrol BID   NUTRITION DIAGNOSIS:   Moderate Malnutrition related to chronic illness as evidenced by moderate fat depletion, severe muscle depletion. Ongoing.   GOAL:   Patient will meet greater than or equal to 90% of their needs Met with TF at goal   MONITOR:   Vent status, Labs, Weight trends, TF tolerance  REASON FOR ASSESSMENT:   Consult, Ventilator Enteral/tube feeding initiation and management  ASSESSMENT:   Pt admitted with AMS, found to have new brain mass. PMH significant for afib on eliquis , HTN, HLD.  Pt discussed during ICU rounds and with RN and MD.  Pt extubated today but currently has increasing O2 requirements. No longer on LTM. Brain biopsy planned for Monday 9/8 due to fevers.    Medications reviewed and include: pepcid , SSI every 4 hours 40 mEq KCl x 1  Labs reviewed:  A1C 6 CBG's: 102-194   Diet Order:   Diet Order             Diet NPO time specified  Diet effective now                   EDUCATION NEEDS:   Not appropriate for education at this time  Skin:  Skin Assessment: Reviewed RN Assessment (Irritant Contact Dermatitis to Bilateral Buttocks)  Last BM:  07/11/24 large type 6, medium type 7  Height:   Ht Readings from Last 1 Encounters:  07/07/24 5' 9 (1.753 m)    Weight:   Wt Readings from Last 1 Encounters:  07/13/24 81.8 kg    Ideal Body Weight:     BMI:  Body mass index is 26.63 kg/m.  Estimated Nutritional Needs:   Kcal:  1800-2000  Protein:  95-110g  Fluid:  >/=1.8L  Jakalyn Kratky P., RD, LDN, CNSC See AMiON for contact information

## 2024-07-13 NOTE — Progress Notes (Signed)
 Restless agitated Trying to throw legs off bed Plna Haldol  x1 Nursing will monitor respons

## 2024-07-13 NOTE — Progress Notes (Signed)
 PHARMACY - ANTICOAGULATION CONSULT NOTE  Pharmacy Consult for Heparin  Indication: atrial fibrillation  No Known Allergies  Patient Measurements: Height: 5' 9 (175.3 cm) Weight: 81.8 kg (180 lb 5.4 oz) IBW/kg (Calculated) : 70.7 HEPARIN  DW (KG): 73.9  Vital Signs: Temp: 98.9 F (37.2 C) (09/04 0600) Temp Source: Esophageal (09/04 0600) BP: 134/79 (09/04 0600) Pulse Rate: 67 (09/04 0600)  Labs: Recent Labs    07/11/24 0508 07/12/24 0337 07/13/24 0450  HGB 13.4 13.5 12.6*  HCT 40.8 41.1 39.1  PLT 189 198 201  HEPARINUNFRC 0.33 0.34 0.30  CREATININE 0.78 0.91 0.70    Estimated Creatinine Clearance: 85.9 mL/min (by C-G formula based on SCr of 0.7 mg/dL).   Assessment: 68 y.o. M on Eliquis  pta for afib. Holding for procedures - brain biopsy of tumor when stable. Last dose 8/28 0830.  Pharmacy consulted for heparin .    Heparin  level 0.30 is borderlinetherapeutic on 1100 units/hr.  Goal of Therapy:  Heparin  level 0.3-0.7 units/ml aPTT 66-102 seconds Monitor platelets by anticoagulation protocol: Yes   Plan:  Increase heparin  gtt by 50 units per hour to 1150 units/hr Monitor daily heparin  level, CBC, signs/symptoms of bleeding  F/u timing of brain biopsy  Vermell Mccallum, PharmD CCM Pharmacy Resident  Please check AMION for all [facility] Pharmacy phone numbers After 10:00 PM, call Main Pharmacy [phone]

## 2024-07-14 DIAGNOSIS — J9691 Respiratory failure, unspecified with hypoxia: Secondary | ICD-10-CM | POA: Diagnosis not present

## 2024-07-14 DIAGNOSIS — G9341 Metabolic encephalopathy: Secondary | ICD-10-CM | POA: Diagnosis not present

## 2024-07-14 DIAGNOSIS — G9389 Other specified disorders of brain: Secondary | ICD-10-CM | POA: Diagnosis not present

## 2024-07-14 DIAGNOSIS — D497 Neoplasm of unspecified behavior of endocrine glands and other parts of nervous system: Secondary | ICD-10-CM | POA: Diagnosis not present

## 2024-07-14 DIAGNOSIS — R569 Unspecified convulsions: Secondary | ICD-10-CM | POA: Diagnosis not present

## 2024-07-14 LAB — BASIC METABOLIC PANEL WITH GFR
Anion gap: 13 (ref 5–15)
BUN: 24 mg/dL — ABNORMAL HIGH (ref 8–23)
CO2: 23 mmol/L (ref 22–32)
Calcium: 8.7 mg/dL — ABNORMAL LOW (ref 8.9–10.3)
Chloride: 105 mmol/L (ref 98–111)
Creatinine, Ser: 0.89 mg/dL (ref 0.61–1.24)
GFR, Estimated: >60 mL/min (ref >60)
Glucose, Bld: 215 mg/dL — ABNORMAL HIGH (ref 70–99)
Potassium: 3.7 mmol/L (ref 3.5–5.1)
Sodium: 141 mmol/L (ref 135–145)

## 2024-07-14 LAB — CBC
HCT: 40.3 % (ref 39.0–52.0)
Hemoglobin: 13.2 g/dL (ref 13.0–17.0)
MCH: 30.8 pg (ref 26.0–34.0)
MCHC: 32.8 g/dL (ref 30.0–36.0)
MCV: 94.2 fL (ref 80.0–100.0)
Platelets: 249 K/uL (ref 150–400)
RBC: 4.28 MIL/uL (ref 4.22–5.81)
RDW: 13.2 % (ref 11.5–15.5)
WBC: 8.3 K/uL (ref 4.0–10.5)
nRBC: 0.4 % — ABNORMAL HIGH (ref 0.0–0.2)

## 2024-07-14 LAB — GLUCOSE, CAPILLARY
Glucose-Capillary: 127 mg/dL — ABNORMAL HIGH (ref 70–99)
Glucose-Capillary: 171 mg/dL — ABNORMAL HIGH (ref 70–99)
Glucose-Capillary: 149 mg/dL — ABNORMAL HIGH (ref 70–99)
Glucose-Capillary: 157 mg/dL — ABNORMAL HIGH (ref 70–99)
Glucose-Capillary: 138 mg/dL — ABNORMAL HIGH (ref 70–99)

## 2024-07-14 LAB — URINALYSIS, ROUTINE W REFLEX MICROSCOPIC
Bilirubin Urine: NEGATIVE
Glucose, UA: 150 mg/dL — AB
Ketones, ur: NEGATIVE mg/dL
Leukocytes,Ua: NEGATIVE
Nitrite: NEGATIVE
Protein, ur: 30 mg/dL — AB
Specific Gravity, Urine: 1.013 (ref 1.005–1.030)
pH: 8 (ref 5.0–8.0)

## 2024-07-14 LAB — HEPARIN LEVEL (UNFRACTIONATED)
Heparin Unfractionated: 0.17 [IU]/mL — ABNORMAL LOW (ref 0.30–0.70)
Heparin Unfractionated: 0.2 [IU]/mL — ABNORMAL LOW (ref 0.30–0.70)
Heparin Unfractionated: 0.31 [IU]/mL (ref 0.30–0.70)

## 2024-07-14 MED ORDER — POTASSIUM CHLORIDE 20 MEQ PO PACK
40.0000 meq | PACK | Freq: Once | ORAL | Status: AC
  Administered 2024-07-14: 40 meq
  Filled 2024-07-14: qty 2

## 2024-07-14 MED ORDER — DOXAZOSIN MESYLATE 2 MG PO TABS
2.0000 mg | ORAL_TABLET | Freq: Every day | ORAL | Status: AC
  Administered 2024-07-14 – 2024-07-16 (×3): 2 mg
  Filled 2024-07-14 (×3): qty 1

## 2024-07-14 MED ORDER — LOPERAMIDE HCL 1 MG/7.5ML PO SUSP
2.0000 mg | Freq: Once | ORAL | Status: AC
  Administered 2024-07-14: 2 mg
  Filled 2024-07-14: qty 15

## 2024-07-14 MED ORDER — FUROSEMIDE 10 MG/ML IJ SOLN
20.0000 mg | Freq: Once | INTRAMUSCULAR | Status: AC
  Administered 2024-07-14: 20 mg via INTRAVENOUS
  Filled 2024-07-14: qty 2

## 2024-07-14 MED ORDER — LABETALOL HCL 5 MG/ML IV SOLN: 20.0000 mg | INTRAVENOUS | Status: DC | PRN

## 2024-07-14 MED ORDER — HYDRALAZINE HCL 20 MG/ML IJ SOLN
20.0000 mg | INTRAMUSCULAR | Status: DC | PRN
  Administered 2024-07-14: 20 mg via INTRAVENOUS
  Filled 2024-07-14: qty 1

## 2024-07-14 NOTE — Progress Notes (Signed)
 PHARMACY - ANTICOAGULATION CONSULT NOTE  Pharmacy Consult for Heparin  Indication: atrial fibrillation  No Known Allergies  Patient Measurements: Height: 5' 9 (175.3 cm) Weight: 78.9 kg (173 lb 15.1 oz) IBW/kg (Calculated) : 70.7 HEPARIN  DW (KG): 73.9  Vital Signs: Temp: 99.1 F (37.3 C) (09/05 1156) Temp Source: Axillary (09/05 1156) BP: 135/79 (09/05 1300) Pulse Rate: 102 (09/05 1300)  Labs: Recent Labs    07/12/24 0337 07/13/24 0450 07/14/24 0009 07/14/24 1255  HGB 13.5 12.6* 13.2  --   HCT 41.1 39.1 40.3  --   PLT 198 201 249  --   HEPARINUNFRC 0.34 0.30 0.17* 0.20*  CREATININE 0.91 0.70 0.89  --     Estimated Creatinine Clearance: 77.2 mL/min (by C-G formula based on SCr of 0.89 mg/dL).   Assessment: 68 y.o. M on Eliquis  pta for afib. Holding for procedures - brain biopsy of tumor when stable. Last dose 8/28 0830.  Pharmacy consulted for heparin .    Heparin  level 0.20 is subtherapeutic on 1250 units/hr. Heparin  running in PICC.   Goal of Therapy:  Heparin  level 0.3- 0.7 units/mL Monitor platelets by anticoagulation protocol: Yes   Plan:  Increase heparin  to 1400 units/hr F/u 8hr heparin  level  Monitor daily heparin  level, CBC, signs/symptoms of bleeding  F/u timing of brain biopsy  Vermell Mccallum, PharmD  Please check AMION for all [facility] Pharmacy phone numbers After 10:00 PM, call Main Pharmacy [phone]

## 2024-07-14 NOTE — Progress Notes (Signed)
 Subjective: Tmax 100.37F. On HHFNC.  Wife at bedside.  Still pretty tachycardic and tachypneic  ROS: negative except above Examination  Vital signs in last 24 hours: Temp:  [97.7 F (36.5 C)-100.7 F (38.2 C)] 100.7 F (38.2 C) (09/05 0316) Pulse Rate:  [73-120] 103 (09/05 0630) Resp:  [18-46] 36 (09/05 0630) BP: (128-210)/(59-183) 142/80 (09/05 0630) SpO2:  [88 %-100 %] 96 % (09/05 0630) FiO2 (%):  [40 %-55 %] 55 % (09/05 0345) Weight:  [78.9 kg] 78.9 kg (09/05 0500)  General: lying in bed, on HHFNC Neuro: MS: Alert, orientedX3 , follows commands CN: pupils equal and reactive,  EOMI, face symmetric, tongue midline, normal sensation over face, left visual field neglect Motor: 4/5 strength in all 4 extremities Coordination: normal Gait: not tested  Basic Metabolic Panel: Recent Labs  Lab 07/07/24 1400 07/07/24 1446 07/09/24 0414 07/10/24 0503 07/10/24 1700 07/11/24 0508 07/12/24 0337 07/13/24 0450 07/14/24 0009  NA  --   --  136 138 138 137 142 143 141  K  --   --  4.0 3.8 4.4 4.0 4.4 4.1 3.7  CL   < >  --  107 107  --  105 110 111 105  CO2   < >  --  18* 22  --  23 21* 22 23  GLUCOSE   < >  --  119* 115*  --  145* 251* 158* 215*  BUN   < >  --  16 20  --  18 21 22  24*  CREATININE   < >  --  0.80 0.84  --  0.78 0.91 0.Wayne 0.89  CALCIUM    < >  --  8.5* 8.1*  --  8.1* 8.3* 8.1* 8.7*  MG  --  2.0 2.1 2.3  --  2.2 2.2  --   --   PHOS  --   --  2.5 2.4*  --  2.5 2.6  --   --    < > = values in this interval not displayed.    CBC: Recent Labs  Lab 07/09/24 0414 07/10/24 1700 07/11/24 0508 07/12/24 0337 07/13/24 0450 07/14/24 0009  WBC 11.7*  --  5.0 4.8 4.8 8.3  HGB 14.7 12.6* 13.4 13.5 12.6* 13.2  HCT 43.7 37.0* 40.8 41.1 39.1 40.3  MCV 92.6  --  93.6 93.4 95.1 94.2  PLT 216  --  189 198 201 249     Coagulation Studies: No results for input(s): LABPROT, INR in the last 72 hours.  Imaging No new brain imaging overnight   ASSESSMENT AND PLAN:Wayne y.o.  Molina with hx of Htn, HLD, pAfibb on eliquis , OSA who presents to the ED with acute onset L arm weakness and L hemianopsia.    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  Will need to be off anticoagulation for about 7 days after biopsy.  Will consider bridging with aspirin  after 48 hours as long as no excessive bleeding after biopsy -As needed Versed  for clinical seizures -Discussed plan with wife at bedside -Neurology will see patient on Monday.  If needed over the weekend, please call neurohospitalist.     I personally spent a total of 26 minutes in the care of the patient today including getting/reviewing separately obtained history, performing a medically appropriate exam/evaluation, counseling and educating, placing orders, referring and communicating with  other health care professionals, documenting clinical information in the EHR, independently interpreting results, and coordinating care.       Arlin Krebs Epilepsy [facility] For questions after 5pm please refer to AMION to reach the Neurologist on call

## 2024-07-14 NOTE — Progress Notes (Addendum)
 eLink Physician-Brief Progress Note Patient Name: RONIEL HALLORAN DOB: Mar 30, 1956 MRN: [id number]   Date of Service  07/14/2024  HPI/Events of Note  Patient was extubated earlier today and having a hard time clearing secretions.  Weak cough.  Currently on minimal flow HHFNC.  Mouth breathing and on visual exam, not particularly distressed.  Patient initially had persistent hypertension despite receiving as needed antihypertensives.  Now improved  eICU Interventions  Already have orders for NT suctioning as needed.  Appropriate response to Lasix  especially with radiographically progressive airspace disease.    Could consider escalating flow rate as HHFNC is typically more efficacious at higher flow rates (50L).  Renew as needed antihypertensives   0103 -replace labetalol  with hydralazine .  Maintain hypertonic saline, add CPT.  Continue NT suction.  High risk of intubation.  Would also benefit from repositioning in bed 0121 -KCl   Intervention Category Intermediate Interventions: Respiratory distress - evaluation and management  Avner Stroder 07/14/2024, 12:13 AM

## 2024-07-14 NOTE — Progress Notes (Signed)
 PT Cancellation Note  Patient Details Name: Wayne Molina MRN: [id number] DOB: Jan 10, 1956   Cancelled Treatment:    Reason Eval/Treat Not Completed: Medical issues which prohibited therapy. Pt requiring 60L 55% FiO2 HHFNC after recent extubation yesterday. Pt will follow up once pt's respiratory status is more stable.   Bernardino JINNY Ruth 07/14/2024, 11:01 AM

## 2024-07-14 NOTE — Progress Notes (Signed)
 RT NTS pt and was able to get a moderate amount of tan/white thick secretions suctioned. Pt did tolerate it well during but became tachycardic in the 140s afterwards then HR settled back down to 99 within a few minutes.

## 2024-07-14 NOTE — Progress Notes (Signed)
 PHARMACY - ANTICOAGULATION CONSULT NOTE  Pharmacy Consult for Heparin  Indication: atrial fibrillation  No Known Allergies  Patient Measurements: Height: 5' 9 (175.3 cm) Weight: 78.9 kg (173 lb 15.1 oz) IBW/kg (Calculated) : 70.7 HEPARIN  DW (KG): 73.9  Vital Signs: Temp: 98.4 F (36.9 C) (09/05 2000) Temp Source: Axillary (09/05 2000) BP: 116/65 (09/05 2200) Pulse Rate: 96 (09/05 2200)  Labs: Recent Labs    07/12/24 0337 07/13/24 0450 07/14/24 0009 07/14/24 1255 07/14/24 2147  HGB 13.5 12.6* 13.2  --   --   HCT 41.1 39.1 40.3  --   --   PLT 198 201 249  --   --   HEPARINUNFRC 0.34 0.30 0.17* 0.20* 0.31  CREATININE 0.91 0.70 0.89  --   --     Estimated Creatinine Clearance: 77.2 mL/min (by C-G formula based on SCr of 0.89 mg/dL).   Assessment: 69 y.o. M on Eliquis  pta for afib. Holding for procedures - brain biopsy of tumor when stable. Last dose 8/28 0830.  Pharmacy consulted for heparin .    Heparin  level 0.31, low end of range, will follow-up with confirmatory level with AM labs.  Goal of Therapy:  Heparin  level 0.3- 0.7 units/mL Monitor platelets by anticoagulation protocol: Yes   Plan:  Continue 1400 units/hr Confirm with AM labs heparin  level.  Monitor daily heparin  level, CBC, signs/symptoms of bleeding  F/u timing of brain biopsy  Larraine Brazier, PharmD Clinical Pharmacist 07/14/2024  10:21 PM **Pharmacist phone directory can now be found on [email or web address] ([email or web address]).  Listed under [facility].

## 2024-07-14 NOTE — Progress Notes (Addendum)
 SLP Cancellation Note  Patient Details Name: Wayne Molina MRN: [id number] DOB: September 10, 1956   Cancelled treatment:       Reason Eval/Treat Not Completed: Patient not medically ready. Pt's respiratory requirements higher today than yesterday at 60 L HFNC. Neurology note states pt is Still pretty tachycardic and tachypneic  this morning. Sent secure chart to MD and RN who reported she does not feel pt is ready for swallow eval added he is so exhausted. Pt has Cortrak. Asked RN to notify SLP if condition changes- will plan on checking pt tomorrow.    Dustin Olam Bull 07/14/2024, 9:22 AM

## 2024-07-14 NOTE — Progress Notes (Signed)
 Pt NTSx1 with small, Wayne Molina, thick secretions. Pt tolerated well.   07/14/24 0124  Therapy Vitals  Pulse Rate 93  Resp (!) 38  Patient Position (if appropriate) Lying  MEWS Score/Color  MEWS Score 3  MEWS Score Color Yellow  Respiratory Assessment  Assessment Type Assess only  Respiratory Pattern Tachypnea  Chest Assessment Chest expansion symmetrical  Cough Congested;Weak  Sputum Amount Small  Sputum Color Wayne Molina  Sputum Consistency Thick  Bilateral Breath Sounds Clear;Diminished  Oxygen Therapy/Pulse Ox  O2 Device HHFNC  O2 Therapy Oxygen humidified  Heater temperature 87.8 F (31 C)  O2 Flow Rate (L/min) (S)  60 L/min (per ELINK)  FiO2 (%) 55 %  SpO2 98 %

## 2024-07-14 NOTE — Progress Notes (Signed)
 PHARMACY - ANTICOAGULATION CONSULT NOTE  Pharmacy Consult for Heparin  Indication: atrial fibrillation  No Known Allergies  Patient Measurements: Height: 5' 9 (175.3 cm) Weight: 81.8 kg (180 lb 5.4 oz) IBW/kg (Calculated) : 70.7 HEPARIN  DW (KG): 73.9  Vital Signs: Temp: 100.7 F (38.2 C) (09/05 0316) Temp Source: Axillary (09/05 0316) BP: 175/94 (09/05 0330) Pulse Rate: 116 (09/05 0345)  Labs: Recent Labs    07/12/24 0337 07/13/24 0450 07/14/24 0009  HGB 13.5 12.6* 13.2  HCT 41.1 39.1 40.3  PLT 198 201 249  HEPARINUNFRC 0.34 0.30 0.17*  CREATININE 0.91 0.70 0.89    Estimated Creatinine Clearance: 77.2 mL/min (by C-G formula based on SCr of 0.89 mg/dL).   Assessment: 68 y.o. M on Eliquis  pta for afib. Holding for procedures - brain biopsy of tumor when stable. Last dose 8/28 0830.  Pharmacy consulted for heparin .    Heparin  level 0.17 is subtherapeutic on 1150 units/hr.  No issues with infusion or bleeding per RN.  Goal of Therapy:  Heparin  level 0.3- 0.7 units/mL Monitor platelets by anticoagulation protocol: Yes   Plan:  Increase heparin  to 1250 units/hr F/u 8hr heparin  level  Monitor daily heparin  level, CBC, signs/symptoms of bleeding  F/u timing of brain biopsy  Jinnie Door, PharmD, BCPS, BCCP Clinical Pharmacist  Please check AMION for all [facility] Pharmacy phone numbers After 10:00 PM, call Main Pharmacy [phone]

## 2024-07-14 NOTE — Progress Notes (Addendum)
 NAME:  Wayne Molina, MRN:  [id number], DOB:  1956-03-07, LOS: 7 ADMISSION DATE:  07/06/2024, CONSULTATION DATE:  07/07/2024 REFERRING MD:  FPTS, CHIEF COMPLAINT:  AMS   History of Present Illness:  68 year old man who presented to [facility] 8/28 with AMS, Code Stroke. PMHx significant for HTN, HLD, Afib on Eliquis , untreated OSA.  Patient presented to ER as a Code Stroke after coworker noticed he was altered since 8/28 around 2230 and pale, patient reported feeling dizzy.  Wife reports pt has been in his normal state of health, just recently not sleeping well for the last week and fatigued.  Nonsmoker, wife denies ETOH use. 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  In ER, noted to have L sided weakness and left hemianopsia.  Last dose Eliquis  reported 8/28.  Code Stroke activated.  CT Head initially concerning for right posterior MCA stroke but CTA negative for LVO.  Also noted to have rhythmic eye twitching, SBP 200s, and more lethargic, oriented to person only.  Repeat CT Head concerning for mass. EEG started and Keppra  load given. FPTS admitted.  Patient was given a total of 4mg  Ativan  for MRI, seizure concern, and intermittent agitation.  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 NSGY was consulted with plan for eventual biopsy.    PCCM consulted for ongoing encephalopathy and concern for airway protection. Intubated 8/29.  Pertinent Medical History:   Past Medical History:  Diagnosis Date   Anxiety    Arthritis    fingers   Chronic atrial fibrillation (HCC)    Depression    Family history of breast cancer    Family history of cancer of male genital organ    Family history of gene mutation    BRIP1   Family history of malignant neoplasm of  gastrointestinal tract    Hyperlipidemia    Hypertension    Personal history of colonic polyps 11/12/2006   Sleep apnea    Significant Hospital Events: Including procedures, antibiotic start and stop dates in addition to other pertinent events   8/28 Admitted seizures. LTM initiated .  8/29 PCCM consulted. Intubated for AW protection. CT Head with enhancing mass (bilobed), with dominant components measuring 2.9 cm and 3.7 cm at the right temporal lobe and splenium respectively. Surrounding vasogenic edema without midline shift. MRI brain Positive for relatively large and infiltrative tumor with heterogeneous enhancement tracking from the splenium of the corpus callosum near midline into the right temporal lobe. Confluent regional tumoral edema. 8/30 Evidence of epileptogenicity and cortical dysfunction arising from right hemisphere likely due to underlying structural abnormality. Seen by neuro surg w/ plan for Bx after off vent and stable 8/31 1-2 Different episodes of jerking concerning for seizures.  None seen on EEG.  Midazolam  titration down.  Added Zosyn  for fever 9/1 Stopping Versed .  Weaning some. Sputum culture growing staph started on vancomycin  until sensitivities back 9/2 Resp Cx with abundant staph aureus, abundant H. Flu. Abx transitioned to Unasyn . 9/3 Some issues with hypertension overnight, remains on vent with fent and precedex  this Precedex  was stopped given concern for fever. 9/4 antibiotics changed to Ancef  to cover both staph and H. influenzae with plan to complete total of 7 days.  Off precedex . Extubated> HHFNC, 3% nebs, lasix , CPT, NT suctioning  Interim History/Subjective:  Agitation better after haldol   and seroquel  Remains on HHFNC 60L, 55% but remains tachypneic Following commands Urinary retention overnight requiring I/O x 2  Objective:   Blood pressure (!) 142/80, pulse (!) 103, temperature 100.2 F (37.9 C), temperature source Axillary, resp. rate (!) 36, height  5' 9 (1.753 m), weight 78.9 kg, SpO2 96%.    FiO2 (%):  [50 %-55 %] 55 %   Intake/Output Summary (Last 24 hours) at 07/14/2024 0902 Last data filed at 07/14/2024 0700 Gross per 24 hour  Intake 843.07 ml  Output 5300 ml  Net -4456.93 ml   Filed Weights   07/12/24 0500 07/13/24 0324 07/14/24 0500  Weight: 80.6 kg 81.8 kg 78.9 kg   Physical Examination: General:  ill appearing older male sitting upright in bed, mild distress HEENT: MM pink/moist, pupils 3/sluggish, anicteric, mild JVD Neuro: will open eyes, and follow simple commands, 4/5 on RUE/ RLE, 2/5 in LLE, slight grip in left hand delayed CV: ir ir afib rate 90 to ~100 PULM:  tachypneic on HHFNC, currently no upper airway sounds, has good cough but not fully able to clear oral secretions at times, some mouth breathing, slight coarse anteriorly, diminished right base, few bibasilar rales GI: soft, bs+, NT, FMS Extremities: warm except for feet which are cool but palpable pulses, motor intact/dry, no tibial edema  Skin: no rashes   Tmax 100.7 UOP 4.7L Net +1L Stool 600 ml/ 24hrs Labs> Na 141, K 3.7, sCr 0.89, WBC 8.3   Resolved Problem List:  Status epilepticus resolved as of 8/31 Abnormal LFTs - normalized 9/2 Fluid and electrolyte imbalance & acid base imbalance  hypophosphatemia, NAGMA -Bicarb gtt from 23 to 18 on 8/31. Not hyperchloremic.  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 Janjua plans for biopsy when 24hrs in absence of fever - Neurology follow peripherally over weekend - cont keppra  1250 mg BID, prn versed  for clinical seizure - cont seizure/ aspiration precautions - cont neuro exams - cont seroquel  25mg  BID, prn haldol  if needed - monitor for further urinary retention> may need foley/ retention meds.  Cont bladder scanning prn.  sCr remains normal.  Will send UA if I/O needed again - maintain neuro protective measures; goal for  eurothermia, euglycemia, eunatermia, normoxia, and PCO2 goal of 35-40 - Nutrition and bowel regiment as below  Hypoxic respiratory failure S/p intubation for airway protection/ seizures.  His primary barrier to extubation has been his mental status.  Extubated 9/4 with rapid escalation of O2 requirements Staph aureus and H Flu pneumonia Started on antibiotics 8/31, MRSA negative. Resp Cx 8/31 with abundant staph aureus, abundant H P:  - remains high risk for reintubation given ongoing WOB and secretion clearance.  Good cough and still following commands, but poor oral clearance of secretions therefore not a NIV candidate - cont HHFNC, goal sats > 92% - NPO, use cortrak - cont 3% nebs, CPT, guaifenesin , NTS prn  - day 6x/ 7 abx> cont ancef  - additional lasix  with KCL today  - intermittent CXR/ blood gas prn - trend WBC/ fever curve  - PT/ OT/ SLP  AFib on Eliquis  - Last dose 8/28 on hold for pending bx P:  - remains rate controlled, cont tele - optimize lytes, K> 4, Mag> 2 - cont heparin  gtt until biopsy, w/ tentative plan to bridge w/ ASA for 48 hours after biopsy as long as bleeding is controlled.  Full anticoagulation will need to be held 7 days  postbiopsy  HTN HLD Plan: - ideally SBP < 160 - hydralazine  increased 25> 50mg  q6hrs yesterday - additional lasix  20mg  today w/ KCL - consider adding low dose BB if needed - cont lipitor   Hyperglycemia - A1c 6.0 P:  - CBG 127- 171 - cont CBG q4, sSSI prn    At risk for malnutrition  P:  - cont EN per RD via cortrak - banatrol added 9/4, monitor stool output> imodium  x 1 today    Critical care time: 40 min      Lyle Pesa, MSN, AG-ACNP-BC [facility] 07/14/2024, 9:02 AM  See Amion for pager If no response to pager , please call [phone] until 7pm After 7:00 pm call Elink  [phone]

## 2024-07-14 NOTE — Plan of Care (Signed)
  Problem: Activity: Goal: Ability to tolerate increased activity will improve Outcome: Not Progressing   Problem: Respiratory: Goal: Ability to maintain a clear airway and adequate ventilation will improve Outcome: Not Progressing   Problem: Role Relationship: Goal: Method of communication will improve Outcome: Progressing   Problem: Education: Goal: Knowledge of General Education information will improve Description: Including pain rating scale, medication(s)/side effects and non-pharmacologic comfort measures Outcome: Not Progressing   Problem: Health Behavior/Discharge Planning: Goal: Ability to manage health-related needs will improve Outcome: Not Progressing   Problem: Clinical Measurements: Goal: Ability to maintain clinical measurements within normal limits will improve Outcome: Not Progressing Goal: Will remain free from infection Outcome: Progressing Goal: Cardiovascular complication will be avoided Outcome: Progressing   Problem: Nutrition: Goal: Adequate nutrition will be maintained Outcome: Progressing   Problem: Pain Managment: Goal: General experience of comfort will improve and/or be controlled Outcome: Progressing

## 2024-07-15 ENCOUNTER — Encounter (HOSPITAL_COMMUNITY): Payer: Self-pay | Admitting: Pulmonary Disease

## 2024-07-15 DIAGNOSIS — J189 Pneumonia, unspecified organism: Secondary | ICD-10-CM | POA: Diagnosis not present

## 2024-07-15 DIAGNOSIS — G9389 Other specified disorders of brain: Secondary | ICD-10-CM | POA: Diagnosis not present

## 2024-07-15 LAB — CBC
HCT: 41.9 % (ref 39.0–52.0)
Hemoglobin: 13.7 g/dL (ref 13.0–17.0)
MCH: 30.7 pg (ref 26.0–34.0)
MCHC: 32.7 g/dL (ref 30.0–36.0)
MCV: 93.9 fL (ref 80.0–100.0)
Platelets: 259 K/uL (ref 150–400)
RBC: 4.46 MIL/uL (ref 4.22–5.81)
RDW: 13.3 % (ref 11.5–15.5)
WBC: 10.3 K/uL (ref 4.0–10.5)
nRBC: 0 % (ref 0.0–0.2)

## 2024-07-15 LAB — GLUCOSE, CAPILLARY
Glucose-Capillary: 121 mg/dL — ABNORMAL HIGH (ref 70–99)
Glucose-Capillary: 122 mg/dL — ABNORMAL HIGH (ref 70–99)
Glucose-Capillary: 138 mg/dL — ABNORMAL HIGH (ref 70–99)
Glucose-Capillary: 138 mg/dL — ABNORMAL HIGH (ref 70–99)
Glucose-Capillary: 141 mg/dL — ABNORMAL HIGH (ref 70–99)
Glucose-Capillary: 142 mg/dL — ABNORMAL HIGH (ref 70–99)
Glucose-Capillary: 144 mg/dL — ABNORMAL HIGH (ref 70–99)
Glucose-Capillary: 220 mg/dL — ABNORMAL HIGH (ref 70–99)

## 2024-07-15 LAB — BASIC METABOLIC PANEL WITH GFR
Anion gap: 9 (ref 5–15)
BUN: 28 mg/dL — ABNORMAL HIGH (ref 8–23)
CO2: 24 mmol/L (ref 22–32)
Calcium: 8.7 mg/dL — ABNORMAL LOW (ref 8.9–10.3)
Chloride: 108 mmol/L (ref 98–111)
Creatinine, Ser: 0.79 mg/dL (ref 0.61–1.24)
GFR, Estimated: >60 mL/min (ref >60)
Glucose, Bld: 131 mg/dL — ABNORMAL HIGH (ref 70–99)
Potassium: 3.9 mmol/L (ref 3.5–5.1)
Sodium: 141 mmol/L (ref 135–145)

## 2024-07-15 LAB — HEPARIN LEVEL (UNFRACTIONATED)
Heparin Unfractionated: 0.29 [IU]/mL — ABNORMAL LOW (ref 0.30–0.70)
Heparin Unfractionated: 0.43 [IU]/mL (ref 0.30–0.70)

## 2024-07-15 LAB — MAGNESIUM: Magnesium: 2.2 mg/dL (ref 1.7–2.4)

## 2024-07-15 MED ORDER — GUAIFENESIN 100 MG/5ML PO LIQD
15.0000 mL | Freq: Four times a day (QID) | ORAL | Status: DC
  Administered 2024-07-15 – 2024-07-20 (×18): 15 mL
  Filled 2024-07-15 (×19): qty 15

## 2024-07-15 MED ORDER — FUROSEMIDE 10 MG/ML IJ SOLN
20.0000 mg | Freq: Once | INTRAMUSCULAR | Status: AC
  Administered 2024-07-15: 20 mg via INTRAVENOUS
  Filled 2024-07-15: qty 2

## 2024-07-15 MED ORDER — CARVEDILOL 3.125 MG PO TABS
3.1250 mg | ORAL_TABLET | Freq: Two times a day (BID) | ORAL | Status: DC
  Administered 2024-07-15 (×2): 3.125 mg
  Filled 2024-07-15 (×2): qty 1

## 2024-07-15 NOTE — Progress Notes (Signed)
 NAME:  Wayne Molina, MRN:  [id number], DOB:  1956-03-03, LOS: 8 ADMISSION DATE:  07/06/2024, CONSULTATION DATE:  07/07/2024 REFERRING MD:  SHIRLEYANN, CHIEF COMPLAINT:  AMS   History of Present Illness:  68 year old man who presented to [facility] 8/28 with AMS, Code Stroke. PMHx significant for HTN, HLD, Afib on Eliquis , untreated OSA.  Patient presented to ER as a Code Stroke after coworker noticed he was altered since 8/28 around 2230 and pale, patient reported feeling dizzy.  Wife reports pt has been in his normal state of health, just recently not sleeping well for the last week and fatigued.  Nonsmoker, wife denies ETOH use. 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  In ER, noted to have L sided weakness and left hemianopsia.  Last dose Eliquis  reported 8/28.  Code Stroke activated.  CT Head initially concerning for right posterior MCA stroke but CTA negative for LVO.  Also noted to have rhythmic eye twitching, SBP 200s, and more lethargic, oriented to person only.  Repeat CT Head concerning for mass. EEG started and Keppra  load given. FPTS admitted.  Patient was given a total of 4mg  Ativan  for MRI, seizure concern, and intermittent agitation.  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 NSGY was consulted with plan for eventual biopsy.    PCCM consulted for ongoing encephalopathy and concern for airway protection. Intubated 8/29.  Pertinent Medical History:   Past Medical History:  Diagnosis Date   Anxiety    Arthritis    fingers   Chronic atrial fibrillation (HCC)    Depression    Family history of breast cancer    Family history of cancer of male genital organ    Family history of gene mutation    BRIP1   Family history of malignant neoplasm of  gastrointestinal tract    Hyperlipidemia    Hypertension    Personal history of colonic polyps 11/12/2006   Sleep apnea    Significant Hospital Events: Including procedures, antibiotic start and stop dates in addition to other pertinent events   8/28 Admitted seizures. LTM initiated .  8/29 PCCM consulted. Intubated for AW protection. CT Head with enhancing mass (bilobed), with dominant components measuring 2.9 cm and 3.7 cm at the right temporal lobe and splenium respectively. Surrounding vasogenic edema without midline shift. MRI brain Positive for relatively large and infiltrative tumor with heterogeneous enhancement tracking from the splenium of the corpus callosum near midline into the right temporal lobe. Confluent regional tumoral edema. 8/30 Evidence of epileptogenicity and cortical dysfunction arising from right hemisphere likely due to underlying structural abnormality. Seen by neuro surg w/ plan for Bx after off vent and stable 8/31 1-2 Different episodes of jerking concerning for seizures.  None seen on EEG.  Midazolam  titration down.  Added Zosyn  for fever 9/1 Stopping Versed .  Weaning some. Sputum culture growing staph started on vancomycin  until sensitivities back 9/2 Resp Cx with abundant staph aureus, abundant H. Flu. Abx transitioned to Unasyn . 9/3 Some issues with hypertension overnight, remains on vent with fent and precedex  this Precedex  was stopped given concern for fever. 9/4 antibiotics changed to Ancef  to cover both staph and H. influenzae with plan to complete total of 7 days.  Off precedex . Extubated> HHFNC, 3% nebs, lasix , CPT, NT suctioning  Interim History/Subjective:  Agitation better after haldol   and seroquel  Remains on HHFNC 60L, 55% but remains tachypneic Following commands Urinary retention overnight requiring I/O x 2  Objective:   Blood pressure 138/82, pulse (!) 111, temperature 99.1 F (37.3 C), temperature source Axillary, resp. rate 20, height 5' 9  (1.753 m), weight 74.7 kg, SpO2 96%.    FiO2 (%):  [43 %-55 %] 43 %   Intake/Output Summary (Last 24 hours) at 07/15/2024 1537 Last data filed at 07/15/2024 0600 Gross per 24 hour  Intake 556.21 ml  Output 1190 ml  Net -633.79 ml   Filed Weights   07/13/24 0324 07/14/24 0500 07/15/24 0600  Weight: 81.8 kg 78.9 kg 74.7 kg   Physical Examination: General ill looking 68 year old male.   Neuro: Following commands.  Off sedation.  Moving all of the extremities.  cardiac regular rate and rhythm Lungs: Rhonchorous breath sounds but much more clear compared to yesterday. Abdomen soft not tender tolerating tube feeds Extremities warm dry no significant edema  Labs reviewed: - BUN 28, creat 0.8.  CBC unremarkable.  Resolved Problem List:  Status epilepticus resolved as of 8/31 Abnormal LFTs - normalized 9/2 Fluid and electrolyte imbalance & acid base imbalance  hypophosphatemia, NAGMA -Bicarb gtt from 23 to 18 on 8/31. Not hyperchloremic.  Assessment and Plan:   Brain mass with new onset seizures: Agitation/delirium: Differential for brain mass is lymphoma versus GBM. LTM not showing any more seizures.  LTM discontinued. Brain biopsy delayed with ongoing fevers.  Tentative plan for Monday. Cont keppra .  Seroquel .   Fevers: Bacterial pneumonia: -Drug fevers versus infection. -Chest x-ray with left lower lobe pneumonia.  Was initially being treated for Staph pneumonia. -Thick secretions seen.  Sputum microbiology positive for Staph aureus plus haemophilus. - On antibiotic with cefazolin .  Complete 7-day course.   -Stopped Precedex . -Fever curve improving.  Status post removal of PICC line.   Hypoxic respiratory failure: - On high flow nasal cannula. - Likely because of pneumonia.  See above. - Will add flutter valve. - repeat lasix .    A-fib on Eliquis : - Currently on heparin  drip.  Post brain biopsy the patient will have to be off AC for at least 7 days.  Plan to bridge  with aspirin  48 hours postbiopsy if okayed by her neurosurgeon.  HTN: HLD: - ideally SBP < 160 - on hydralazine . Added coreg .  - cont lipitor   Hyperglycemia: - A1c 6.0 - cont CBG q4, sSSI prn    At risk for malnutrition:   - cont TF.  - will add FWF tomorrow.    CRITICAL CARE Performed by: Sammi JONETTA Fredericks.     Total critical care time: 35 minutes   Critical care time was exclusive of separately billable procedures and treating other patients.   Critical care was necessary to treat or prevent imminent or life-threatening deterioration.   Critical care was time spent personally by me on the following activities: development of treatment plan with patient and/or surrogate as well as nursing, discussions with consultants, evaluation of patient's response to treatment, examination of patient, obtaining history from patient or surrogate, ordering and performing treatments and interventions, ordering and review of laboratory studies, ordering and review of radiographic studies, pulse oximetry, re-evaluation of patient's condition and participation in multidisciplinary rounds.  Sammi JONETTA Fredericks, MD Pulmonary, Critical Care and Sleep Attending.  Pager: [phone]  07/15/2024, 3:50 PM

## 2024-07-15 NOTE — Progress Notes (Signed)
 PHARMACY - ANTICOAGULATION CONSULT NOTE  Pharmacy Consult for Heparin  Indication: atrial fibrillation  No Known Allergies  Patient Measurements: Height: 5' 9 (175.3 cm) Weight: 74.7 kg (164 lb 10.9 oz) IBW/kg (Calculated) : 70.7 HEPARIN  DW (KG): 73.9  Vital Signs: Temp: 99.1 F (37.3 C) (09/06 0750) Temp Source: Oral (09/06 0750) BP: 156/89 (09/06 0700) Pulse Rate: 110 (09/06 0700)  Labs: Recent Labs    07/13/24 0450 07/14/24 0009 07/14/24 1255 07/14/24 2147 07/15/24 0550  HGB 12.6* 13.2  --   --  13.7  HCT 39.1 40.3  --   --  41.9  PLT 201 249  --   --  259  HEPARINUNFRC 0.30 0.17* 0.20* 0.31 0.29*  CREATININE 0.70 0.89  --   --  0.79    Estimated Creatinine Clearance: 85.9 mL/min (by C-G formula based on SCr of 0.79 mg/dL).   Assessment: 68 y.o. M on Eliquis  pta for afib. Holding for procedures - brain biopsy of tumor when stable. Last dose 8/28 0830.  Pharmacy consulted for heparin .    HL 0.29 - subtherapeutic   Goal of Therapy:  Heparin  level 0.3- 0.7 units/mL Monitor platelets by anticoagulation protocol: Yes   Plan:  Increase heparin  to 1500 units.hr 8hr HL  Monitor daily heparin  level, CBC, signs/symptoms of bleeding  F/u timing of brain biopsy  Sharyne Glatter, PharmD, BCCCP Critical Care Clinical Pharmacist 07/15/2024 8:09 AM

## 2024-07-15 NOTE — Progress Notes (Signed)
 SLP Cancellation Note  Patient Details Name: Wayne Molina MRN: [id number] DOB: 12-Dec-1955   Cancelled treatment:       Reason Eval/Treat Not Completed: Patient not medically ready Spoke with patient's RN who reported that although his oxygen requirement has decreased (from 60 to 40L), he is still not managing his secretions. She advised to hold off on swallow evaluation until after brain biopsy surgery on Monday. SLP will continue to follow for readiness.  Norleen IVAR Blase, MA, CCC-SLP Speech Therapy

## 2024-07-15 NOTE — Plan of Care (Signed)
  Problem: Activity: Goal: Ability to tolerate increased activity will improve Outcome: Not Progressing   Problem: Respiratory: Goal: Ability to maintain a clear airway and adequate ventilation will improve Outcome: Progressing   Problem: Role Relationship: Goal: Method of communication will improve Outcome: Progressing   Problem: Education: Goal: Knowledge of General Education information will improve Description: Including pain rating scale, medication(s)/side effects and non-pharmacologic comfort measures Outcome: Not Progressing   Problem: Clinical Measurements: Goal: Ability to maintain clinical measurements within normal limits will improve Outcome: Not Progressing Goal: Will remain free from infection Outcome: Progressing Goal: Diagnostic test results will improve Outcome: Progressing Goal: Respiratory complications will improve Outcome: Progressing Goal: Cardiovascular complication will be avoided Outcome: Progressing   Problem: Activity: Goal: Risk for activity intolerance will decrease Outcome: Not Progressing   Problem: Nutrition: Goal: Adequate nutrition will be maintained Outcome: Progressing   Problem: Pain Managment: Goal: General experience of comfort will improve and/or be controlled Outcome: Progressing   Problem: Elimination: Goal: Will not experience complications related to bowel motility Outcome: Not Progressing Goal: Will not experience complications related to urinary retention Outcome: Not Progressing   Problem: Skin Integrity: Goal: Risk for impaired skin integrity will decrease Outcome: Progressing   Problem: Skin Integrity: Goal: Risk for impaired skin integrity will decrease Outcome: Progressing   Problem: Education: Goal: Ability to describe self-care measures that may prevent or decrease complications (Diabetes Survival Skills Education) will improve Outcome: Not Progressing Goal: Individualized Educational Video(s) Outcome:  Not Progressing   Problem: Education: Goal: Individualized Educational Video(s) Outcome: Not Progressing   Problem: Health Behavior/Discharge Planning: Goal: Ability to identify and utilize available resources and services will improve Outcome: Not Progressing Goal: Ability to manage health-related needs will improve Outcome: Progressing

## 2024-07-15 NOTE — Progress Notes (Signed)
 Inpatient Rehab Admissions Coordinator Note:   Per PT patient was screened for CIR candidacy by Tandi Hanko SHAUNNA Yvone Cohens, CCC-SLP. At this time, pt is not medically ready for CIR. Pt is on HHFNC 40L. Will not pursue rehab consult for this pt at this time. CIR admissions team will follow to monitor for medical readiness. A consult order will be placed if pt appears to be an appropriate candidate.   Tinnie Yvone Cohens, MS, CCC-SLP Admissions Coordinator [phone] 07/15/24 6:11 PM

## 2024-07-15 NOTE — Evaluation (Signed)
 Physical Therapy Evaluation Patient Details Name: Wayne Molina MRN: [id number] DOB: August 16, 1956 Today's Date: 07/15/2024  History of Present Illness  Pt is a 68 y/o male presenting on 8/28 with L arm weakness, L hemianopsia, and slurred speech. 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 Course complicated by seizure episode. Intubated 8/29, extubated 9/4 but still with difficulty managing secretions. PMH includes: anxiety, HLD, HTN, sleep apnea, chronic afib.   Clinical Impression  Pt presents with condition above and deficits mentioned below, see PT Problem List. PTA, he was independent without DME, working in Materials at [employer], and living with his wife in a 1-level house with a level entry. His family reports he will have 24/7 care available to him at d/c. Currently, the pt is demonstrating deficits in gross strength (noted to be weaker in L UE than R UE, but difficult to determine whether one leg was weaker than the other due cognitive and communication deficits this date), communication (soft spoken), level of arousal, cognition, balance, power, and activity tolerance. The pt has a disconjugate gaze and is unable to track across midline to the L. He seems unaware of vision deficits, stating he could see therapist even though eyes were closed or he was looking the other way. Pt also with noted L inattention, often moving his R UE instead of his L to perform a task or follow cues even though he is L hand dominant at baseline. The pt displays questionable apraxia by moving his legs in the opposite direction than cued intermittently. When transferring to the stand the first rep the pt lifted his legs off the ground instead of extending them to stand, thereby requiring total assist. The second rep he did better using his legs to stand but maintained a flexed posture and required maxA. He is currently requiring maxA for bed  mobility, but varied in needing CGA-maxA for static sitting balance. He initially leaned posteriorly then began to lean anteriorly as he fatigued while sitting EOB. As the pt has had a drastic functional decline, is motivated to participate and improve, and has good family support, recommend intensive inpatient rehab, > 3 hours/day, to maximize his independence and safety with mobility. Will continue to follow acutely.       If plan is discharge home, recommend the following: Two people to help with walking and/or transfers;Two people to help with bathing/dressing/bathroom;Assistance with cooking/housework;Assistance with feeding;Direct supervision/assist for medications management;Assist for transportation;Direct supervision/assist for financial management;Supervision due to cognitive status   Can travel by private vehicle        Equipment Recommendations BSC/3in1;Wheelchair (measurements PT);Wheelchair cushion (measurements PT);Hospital bed;Hoyer lift;Rolling walker (2 wheels) (pending progress)  Recommendations for Other Services  OT consult;Rehab consult    Functional Status Assessment Patient has had a recent decline in their functional status and demonstrates the ability to make significant improvements in function in a reasonable and predictable amount of time.     Precautions / Restrictions Precautions Precautions: Fall;Other (comment) Recall of Precautions/Restrictions: Impaired Precaution/Restrictions Comments: cortrak; HHFNC, watch vitals; flexiseal Restrictions Weight Bearing Restrictions Per Provider Order: No      Mobility  Bed Mobility Overal bed mobility: Needs Assistance Bed Mobility: Supine to Sit, Sit to Supine     Supine to sit: Max assist, HOB elevated Sit to supine: Max assist, HOB elevated   General bed mobility comments: Step-by-step cues and extra time along with maxA needed to manage bil  legs and trunk for all bed mobility. Multi-modal cues provided to  bring each leg off L EOB with pt intermittently moving legs on opposite direction then cued. Cues provided to pull up with HHA on therapist to ascend trunk. Cues provided to lean laterally to L elbow to descend trunk when returning to supine.    Transfers Overall transfer level: Needs assistance Equipment used: 1 person hand held assist Transfers: Sit to/from Stand Sit to Stand: Max assist, Total assist           General transfer comment: Cued pt to hold onto therapist anterior to him to pull up to stand with pt only grasping around therapist with his R UE. Bil knees blocked and assistance needed at hips to extend and power up to stand, x2 reps. The first rep, pt flexed his legs and lifted his feet off the ground, doing the opposite of what was cued to stand, and needed total assist. The second rep, pt did assist some to extend legs but was unable to extend knees or hips fully to stand upright, needing maxA.    Ambulation/Gait               General Gait Details: unable due to being unable to stand fully upright with max-total assist  Stairs            Wheelchair Mobility     Tilt Bed    Modified Rankin (Stroke Patients Only) Modified Rankin (Stroke Patients Only) Pre-Morbid Rankin Score: No symptoms Modified Rankin: Severe disability     Balance Overall balance assessment: Needs assistance Sitting-balance support: Feet supported, Single extremity supported, Bilateral upper extremity supported, No upper extremity supported Sitting balance-Leahy Scale: Poor Sitting balance - Comments: Pt initially leaning posteriorly, needing modA and progressing to CGA with intermittent minA when cued to flex at trunk to correct posterior lean. As pt fatigued though, he began to lean anteriorly, needing mod-maxA to maintain balance and pt unable to extend trunk when cued to correct anterior lean. Postural control: Posterior lean, Other (comment) (anterior lean) Standing balance  support: Bilateral upper extremity supported Standing balance-Leahy Scale: Zero Standing balance comment: Max-total assist to stand with flexed posture and HHA                             Pertinent Vitals/Pain Pain Assessment Pain Assessment: Faces Faces Pain Scale: Hurts a little bit Pain Location: generalized with movement Pain Descriptors / Indicators: Discomfort, Grimacing, Guarding Pain Intervention(s): Limited activity within patient's tolerance, Monitored during session, Repositioned    Home Living Family/patient expects to be discharged to:: Private residence Living Arrangements: Spouse/significant other Available Help at Discharge: Family;Available 24 hours/day Type of Home: House Home Access: Level entry       Home Layout: One level Home Equipment: None      Prior Function Prior Level of Function : Independent/Modified Independent;History of Falls (last six months)             Mobility Comments: No AD, x1 fall when stepped off deck a few weeks ago, but no other falls ADLs Comments: Works night shift in Materials at [employer]     Extremity/Trunk Assessment   Upper Extremity Assessment Upper Extremity Assessment: Defer to OT evaluation    Lower Extremity Assessment Lower Extremity Assessment: Generalized weakness;Difficult to assess due to impaired cognition;RLE deficits/detail;LLE deficits/detail RLE Deficits / Details: difficult to fully assess due to lethargy and cognitive and communication deficits but pt  denies numbness/tingling bil with light touch but not very specific when asked to state where he was feeling the light touch bil (unsure if due to cog or communication though), able to extend knees against gravity but difficulty flexing hips against gravity with gross MMT scores of 2+ to 4- bil, questionable apraxia with movement as pt would sometimes do the opposite of what was cued LLE Deficits / Details: difficult to fully assess due to lethargy and  cognitive and communication deficits but pt denies numbness/tingling bil with light touch but not very specific when asked to state where he was feeling the light touch bil (unsure if due to cog or communication though), able to extend knees against gravity but difficulty flexing hips against gravity with gross MMT scores of 2+ to 4- bil, questionable apraxia with movement as pt would sometimes do the opposite of what was cued    Cervical / Trunk Assessment Cervical / Trunk Assessment: Kyphotic  Communication   Communication Communication: Impaired Factors Affecting Communication: Reduced clarity of speech    Cognition Arousal: Lethargic Behavior During Therapy: Flat affect   PT - Cognitive impairments: Difficult to assess Difficult to assess due to: Level of arousal, Impaired communication                     PT - Cognition Comments: Pt very soft spoken and lethargic, impacting cognitive assessment. Pt often needs cues to open and keep eyes open during session. Noted decreased L attention, often moving his R UE rather than his L even though L hand dominant at baseline. Poor awareness of his deficits, reporting he can see pt yet having eyes closed or not able to track therapist to L. Only able to get L eye to midline and unable to cross to track to L. Able to track >/= ~50% of the way to R when cued. Eyes disconjugate. Delayed processing and initiation noted with questionable apraxia as pt would do the opposite of cued movements intermittently Following commands: Impaired Following commands impaired: Follows one step commands inconsistently, Follows one step commands with increased time     Cueing Cueing Techniques: Verbal cues, Tactile cues, Visual cues, Gestural cues     General Comments General comments (skin integrity, edema, etc.): VSS with HR max of 127 bpm while on HHFNC 40L 42% FiO2    Exercises     Assessment/Plan    PT Assessment Patient needs continued PT services   PT Problem List Decreased range of motion;Decreased strength;Decreased activity tolerance;Decreased balance;Decreased mobility;Decreased cognition;Decreased coordination;Decreased knowledge of use of DME;Decreased safety awareness;Cardiopulmonary status limiting activity       PT Treatment Interventions DME instruction;Gait training;Stair training;Functional mobility training;Therapeutic exercise;Therapeutic activities;Balance training;Neuromuscular re-education;Cognitive remediation;Patient/family education;Wheelchair mobility training    PT Goals (Current goals can be found in the Care Plan section)  Acute Rehab PT Goals Patient Stated Goal: pt did not state; family wishes for pt to improve PT Goal Formulation: With patient/family Time For Goal Achievement: 07/29/24 Potential to Achieve Goals: Fair    Frequency Min 3X/week     Co-evaluation               AM-PAC PT 6 Clicks Mobility  Outcome Measure Help needed turning from your back to your side while in a flat bed without using bedrails?: A Lot Help needed moving from lying on your back to sitting on the side of a flat bed without using bedrails?: A Lot Help needed moving to and from a bed to a  chair (including a wheelchair)?: Total Help needed standing up from a chair using your arms (e.g., wheelchair or bedside chair)?: Total Help needed to walk in hospital room?: Total Help needed climbing 3-5 steps with a railing? : Total 6 Click Score: 8    End of Session Equipment Utilized During Treatment: Oxygen Activity Tolerance: Patient limited by fatigue Patient left: in bed;with call bell/phone within reach;with bed alarm set;with family/visitor present Nurse Communication: Other (comment);Mobility status;Need for lift equipment (vitals) PT Visit Diagnosis: Unsteadiness on feet (R26.81);Other abnormalities of gait and mobility (R26.89);Muscle weakness (generalized) (M62.81);Difficulty in walking, not elsewhere classified  (R26.2);Other symptoms and signs involving the nervous system (R29.898)    Time: 1340-1416 PT Time Calculation (min) (ACUTE ONLY): 36 min   Charges:   PT Evaluation $PT Eval Moderate Complexity: 1 Mod PT Treatments $Therapeutic Activity: 8-22 mins PT General Charges $$ ACUTE PT VISIT: 1 Visit         Theo Ferretti, PT, DPT Acute Rehabilitation Services  Office: [phone]   Theo CHRISTELLA Ferretti 07/15/2024, 6:03 PM

## 2024-07-15 NOTE — Progress Notes (Signed)
 PHARMACY - ANTICOAGULATION CONSULT NOTE  Pharmacy Consult for Heparin  Indication: atrial fibrillation  No Known Allergies  Patient Measurements: Height: 5' 9 (175.3 cm) Weight: 74.7 kg (164 lb 10.9 oz) IBW/kg (Calculated) : 70.7 HEPARIN  DW (KG): 73.9  Vital Signs: Temp: 98.7 F (37.1 C) (09/06 1554) Temp Source: Axillary (09/06 1554) BP: 138/82 (09/06 1300) Pulse Rate: 111 (09/06 1300)  Labs: Recent Labs    07/13/24 0450 07/14/24 0009 07/14/24 1255 07/14/24 2147 07/15/24 0550 07/15/24 1654  HGB 12.6* 13.2  --   --  13.7  --   HCT 39.1 40.3  --   --  41.9  --   PLT 201 249  --   --  259  --   HEPARINUNFRC 0.30 0.17*   < > 0.31 0.29* 0.43  CREATININE 0.70 0.89  --   --  0.79  --    < > = values in this interval not displayed.    Estimated Creatinine Clearance: 85.9 mL/min (by C-G formula based on SCr of 0.79 mg/dL).   Assessment: 68 y.o. M on Eliquis  pta for afib. Holding for procedures - brain biopsy of tumor when stable. Last dose 8/28 0830.  Pharmacy consulted for heparin .    HL 0.43 therapeutic after rate increase to 1500 units/hr.    Goal of Therapy:  Heparin  level 0.3- 0.7 units/mL Monitor platelets by anticoagulation protocol: Yes   Plan:  Continue heparin  1500 units/hr.  Monitor daily heparin  level, CBC, signs/symptoms of bleeding  F/u timing of brain biopsy  Maurilio Fila, PharmD Clinical Pharmacist 07/15/2024  5:19 PM

## 2024-07-16 DIAGNOSIS — G9389 Other specified disorders of brain: Secondary | ICD-10-CM | POA: Diagnosis not present

## 2024-07-16 LAB — CBC
HCT: 45.1 % (ref 39.0–52.0)
Hemoglobin: 15.1 g/dL (ref 13.0–17.0)
MCH: 30.8 pg (ref 26.0–34.0)
MCHC: 33.5 g/dL (ref 30.0–36.0)
MCV: 91.9 fL (ref 80.0–100.0)
Platelets: 265 K/uL (ref 150–400)
RBC: 4.91 MIL/uL (ref 4.22–5.81)
RDW: 13.2 % (ref 11.5–15.5)
WBC: 11.8 K/uL — ABNORMAL HIGH (ref 4.0–10.5)
nRBC: 0 % (ref 0.0–0.2)

## 2024-07-16 LAB — BASIC METABOLIC PANEL WITH GFR
Anion gap: 13 (ref 5–15)
BUN: 27 mg/dL — ABNORMAL HIGH (ref 8–23)
CO2: 23 mmol/L (ref 22–32)
Calcium: 9.2 mg/dL (ref 8.9–10.3)
Chloride: 103 mmol/L (ref 98–111)
Creatinine, Ser: 0.76 mg/dL (ref 0.61–1.24)
GFR, Estimated: >60 mL/min (ref >60)
Glucose, Bld: 145 mg/dL — ABNORMAL HIGH (ref 70–99)
Potassium: 4.1 mmol/L (ref 3.5–5.1)
Sodium: 139 mmol/L (ref 135–145)

## 2024-07-16 LAB — GLUCOSE, CAPILLARY
Glucose-Capillary: 120 mg/dL — ABNORMAL HIGH (ref 70–99)
Glucose-Capillary: 143 mg/dL — ABNORMAL HIGH (ref 70–99)
Glucose-Capillary: 148 mg/dL — ABNORMAL HIGH (ref 70–99)
Glucose-Capillary: 149 mg/dL — ABNORMAL HIGH (ref 70–99)
Glucose-Capillary: 153 mg/dL — ABNORMAL HIGH (ref 70–99)
Glucose-Capillary: 156 mg/dL — ABNORMAL HIGH (ref 70–99)

## 2024-07-16 LAB — HEPARIN LEVEL (UNFRACTIONATED): Heparin Unfractionated: 0.31 [IU]/mL (ref 0.30–0.70)

## 2024-07-16 MED ORDER — LEVETIRACETAM 750 MG PO TABS
1250.0000 mg | ORAL_TABLET | Freq: Two times a day (BID) | ORAL | Status: DC
  Administered 2024-07-16 – 2024-07-20 (×8): 1250 mg
  Filled 2024-07-16 (×8): qty 1

## 2024-07-16 MED ORDER — FREE WATER
200.0000 mL | Freq: Four times a day (QID) | Status: DC
  Administered 2024-07-16 – 2024-07-18 (×6): 200 mL

## 2024-07-16 MED ORDER — CARVEDILOL 6.25 MG PO TABS
6.2500 mg | ORAL_TABLET | Freq: Two times a day (BID) | ORAL | Status: DC
  Administered 2024-07-16 – 2024-07-20 (×9): 6.25 mg
  Filled 2024-07-16 (×2): qty 1
  Filled 2024-07-16 (×2): qty 2
  Filled 2024-07-16: qty 1
  Filled 2024-07-16: qty 2
  Filled 2024-07-16: qty 1
  Filled 2024-07-16 (×2): qty 2

## 2024-07-16 MED ORDER — METOPROLOL TARTRATE 5 MG/5ML IV SOLN
5.0000 mg | Freq: Four times a day (QID) | INTRAVENOUS | Status: DC | PRN
  Administered 2024-07-18: 5 mg via INTRAVENOUS
  Filled 2024-07-16: qty 5

## 2024-07-16 MED ORDER — FUROSEMIDE 10 MG/ML IJ SOLN
20.0000 mg | Freq: Once | INTRAMUSCULAR | Status: AC
  Administered 2024-07-16: 20 mg via INTRAVENOUS
  Filled 2024-07-16: qty 2

## 2024-07-16 NOTE — Progress Notes (Addendum)
 NAME:  Wayne Molina, MRN:  [id number], DOB:  1956/08/25, LOS: 9 ADMISSION DATE:  07/06/2024, CONSULTATION DATE:  07/07/2024 REFERRING MD:  SHIRLEYANN, CHIEF COMPLAINT:  AMS   History of Present Illness:  68 year old man who presented to [facility] 8/28 with AMS, Code Stroke. PMHx significant for HTN, HLD, Afib on Eliquis , untreated OSA.  Patient presented to ER as a Code Stroke after coworker noticed he was altered since 8/28 around 2230 and pale, patient reported feeling dizzy.  Wife reports pt has been in his normal state of health, just recently not sleeping well for the last week and fatigued.  Nonsmoker, wife denies ETOH use. 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  In ER, noted to have L sided weakness and left hemianopsia.  Last dose Eliquis  reported 8/28.  Code Stroke activated.  CT Head initially concerning for right posterior MCA stroke but CTA negative for LVO.  Also noted to have rhythmic eye twitching, SBP 200s, and more lethargic, oriented to person only.  Repeat CT Head concerning for mass. EEG started and Keppra  load given. FPTS admitted.  Patient was given a total of 4mg  Ativan  for MRI, seizure concern, and intermittent agitation.  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 NSGY was consulted with plan for eventual biopsy.    PCCM consulted for ongoing encephalopathy and concern for airway protection. Intubated 8/29.  Pertinent Medical History:   Past Medical History:  Diagnosis Date   Anxiety    Arthritis    fingers   Chronic atrial fibrillation (HCC)    Depression    Family history of breast cancer    Family history of cancer of male genital organ    Family history of gene mutation    BRIP1   Family history of malignant neoplasm of  gastrointestinal tract    Hyperlipidemia    Hypertension    Personal history of colonic polyps 11/12/2006   Sleep apnea    Significant Hospital Events: Including procedures, antibiotic start and stop dates in addition to other pertinent events   8/28 Admitted seizures. LTM initiated .  8/29 PCCM consulted. Intubated for AW protection. CT Head with enhancing mass (bilobed), with dominant components measuring 2.9 cm and 3.7 cm at the right temporal lobe and splenium respectively. Surrounding vasogenic edema without midline shift. MRI brain Positive for relatively large and infiltrative tumor with heterogeneous enhancement tracking from the splenium of the corpus callosum near midline into the right temporal lobe. Confluent regional tumoral edema. 8/30 Evidence of epileptogenicity and cortical dysfunction arising from right hemisphere likely due to underlying structural abnormality. Seen by neuro surg w/ plan for Bx after off vent and stable 8/31 1-2 Different episodes of jerking concerning for seizures.  None seen on EEG.  Midazolam  titration down.  Added Zosyn  for fever 9/1 Stopping Versed .  Weaning some. Sputum culture growing staph started on vancomycin  until sensitivities back 9/2 Resp Cx with abundant staph aureus, abundant H. Flu. Abx transitioned to Unasyn . 9/3 Some issues with hypertension overnight, remains on vent with fent and precedex  this Precedex  was stopped given concern for fever. 9/4 antibiotics changed to Ancef  to cover both staph and H. influenzae with plan to complete total of 7 days.  Off precedex . Extubated> HHFNC, 3% nebs, lasix , CPT, NT suctioning  Interim History/Subjective:  Alert and oriented x  3.  Breathing better.  Still on high flow nasal cannula but oxygen needs have improved.  On 35% FiO2. Antibiotics will end today.  Objective:   Blood pressure 137/77, pulse 97, temperature 99.2 F (37.3 C), temperature source Axillary, resp. rate (!) 27, height 5' 9 (1.753 m),  weight 73.6 kg, SpO2 96%.    FiO2 (%):  [43 %-45 %] 45 %   Intake/Output Summary (Last 24 hours) at 07/16/2024 1339 Last data filed at 07/16/2024 1200 Gross per 24 hour  Intake 1670.01 ml  Output 2685 ml  Net -1014.99 ml   Filed Weights   07/14/24 0500 07/15/24 0600 07/16/24 0313  Weight: 78.9 kg 74.7 kg 73.6 kg   Physical Examination: General ill looking 68 year old male.   Neuro: Following commands.  Alert and oriented x 3.  Moving all extremities. cardiac regular rate and rhythm Lungs: Still has rhonchorous breath sounds. Abdomen soft not tender tolerating tube feeds Extremities warm dry no significant edema  Labs reviewed: - BUN 27, creat 0.8.  CBC unremarkable.  Resolved Problem List:  Status epilepticus resolved as of 8/31 Abnormal LFTs - normalized 9/2 Fluid and electrolyte imbalance & acid base imbalance  hypophosphatemia, NAGMA -Bicarb gtt from 23 to 18 on 8/31. Not hyperchloremic.  Assessment and Plan:   Brain mass with new onset seizures: Agitation/delirium: Differential for brain mass is lymphoma versus GBM. LTM not showing any more seizures.  LTM discontinued. Brain biopsy was delayed because of ongoing fevers.  Fevers now resolved.  Plan for brain biopsy tomorrow by Dr. Janjua Cont keppra .  Seroquel .   Fevers: Bacterial pneumonia: -Drug fevers versus infection. -Chest x-ray with left lower lobe pneumonia.  Was initially being treated for Staph pneumonia. -Thick secretions seen.  Sputum microbiology positive for Staph aureus plus haemophilus. - On antibiotic with cefazolin .  Complete 7-day course.  Last day today. -Stopped Precedex . -Fever curve improving.  Status post removal of PICC line.   Hypoxic respiratory failure: - On high flow nasal cannula. - Likely because of pneumonia.  See above. - Will add flutter valve. - Repeat dose of Lasix  today.   A-fib on Eliquis : - Currently on heparin  drip.  Will hold at midnight today per Dr. Janjua.  Post  brain biopsy the patient will have to be off AC for at least 7 days.  Plan to bridge with aspirin  48 hours postbiopsy if okayed by her neurosurgeon.  HTN: HLD: - ideally SBP < 160 - on hydralazine . Added coreg .  - cont lipitor   Hyperglycemia: - A1c 6.0 - cont CBG q4, sSSI prn    At risk for malnutrition:   - cont TF.  - Free water  200 every 6 added.  Patient's wife at bedside is updated every day of patient's condition.   CRITICAL CARE Performed by: Sammi JONETTA Fredericks.     Total critical care time: 35 minutes   Critical care time was exclusive of separately billable procedures and treating other patients.   Critical care was necessary to treat or prevent imminent or life-threatening deterioration.   Critical care was time spent personally by me on the following activities: development of treatment plan with patient and/or surrogate as well as nursing, discussions with consultants, evaluation of patient's response to treatment, examination of patient, obtaining history from patient or surrogate, ordering and performing treatments and interventions, ordering and review of laboratory studies, ordering and review of radiographic studies, pulse oximetry, re-evaluation of patient's condition and participation in multidisciplinary rounds.  Sammi JONETTA Fredericks, MD Pulmonary,  Critical Care and Sleep Attending.  Pager: [phone]  07/16/2024, 1:39 PM

## 2024-07-16 NOTE — Plan of Care (Signed)
   Problem: Nutrition: Goal: Adequate nutrition will be maintained Outcome: Progressing   Problem: Pain Managment: Goal: General experience of comfort will improve and/or be controlled Outcome: Progressing   Problem: Safety: Goal: Ability to remain free from injury will improve Outcome: Progressing

## 2024-07-16 NOTE — Progress Notes (Addendum)
 PHARMACY - ANTICOAGULATION CONSULT NOTE  Pharmacy Consult for Heparin  Indication: atrial fibrillation  No Known Allergies  Patient Measurements: Height: 5' 9 (175.3 cm) Weight: 73.6 kg (162 lb 4.1 oz) IBW/kg (Calculated) : 70.7 HEPARIN  DW (KG): 73.9  Vital Signs: Temp: 99 F (37.2 C) (09/07 0400) Temp Source: Axillary (09/07 0400) BP: 143/84 (09/07 0700) Pulse Rate: 104 (09/07 0700)  Labs: Recent Labs    07/14/24 0009 07/14/24 1255 07/15/24 0550 07/15/24 1654 07/16/24 0412  HGB 13.2  --  13.7  --  15.1  HCT 40.3  --  41.9  --  45.1  PLT 249  --  259  --  265  HEPARINUNFRC 0.17*   < > 0.29* 0.43 0.31  CREATININE 0.89  --  0.79  --  0.76   < > = values in this interval not displayed.    Estimated Creatinine Clearance: 85.9 mL/min (by C-G formula based on SCr of 0.76 mg/dL).   Assessment: 68 y.o. M on Eliquis  pta for afib. Holding for procedures - brain biopsy of tumor when stable. Last dose 8/28 0830.  Pharmacy consulted for heparin .    HL 0.31 therapeutic this AM CBC stable   Goal of Therapy:  Heparin  level 0.3- 0.7 units/mL Monitor platelets by anticoagulation protocol: Yes   Plan:  Continue heparin  1500 units/hr.  Monitor daily heparin  level, CBC, signs/symptoms of bleeding  F/u timing of brain biopsy for Monday   -----  Per Dr Janjua (NSGY) - stop heparin  gtt at 0000 on 9/8 in preparation for brain biopsy. Stop time entered on order. Pharmacy will f/u when to restart post-op.  Sharyne Glatter, PharmD, BCCCP Critical Care Clinical Pharmacist 07/16/2024 10:13 AM

## 2024-07-16 NOTE — Progress Notes (Signed)
 Physical Therapy Treatment Patient Details Name: Wayne Molina MRN: [id number] DOB: 10-May-1956 Today's Date: 07/16/2024   History of Present Illness Pt is a 68 y/o male presenting on 8/28 with L arm weakness, L hemianopsia, and slurred speech. 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 Course complicated by seizure episode. Intubated 8/29, extubated 9/4 but still with difficulty managing secretions. PMH includes: anxiety, HLD, HTN, sleep apnea, chronic afib.    PT Comments  Pt seen for progression of activity tolerance, OOB mobility, and balance progression. The pt was able to demo improvement in ability to follow commands for bed mobility, and improved seated balance and tolerance for sitting EOB as pt was able to progress to sitting EOB with CGA and cues for position. The pt tolerated sit-stand x2 with assist from therapists at bed pad to facilitate hip clearance, trunk extension, and bilateral knee blocking due to poor activation and initiation in LE. The pt was eager to progress standing and would assist with rocking momentum prior to stand. Will continue to follow and re-assess after procedure planned for Monday.    If plan is discharge home, recommend the following: Two people to help with walking and/or transfers;Two people to help with bathing/dressing/bathroom;Assistance with cooking/housework;Assistance with feeding;Direct supervision/assist for medications management;Assist for transportation;Direct supervision/assist for financial management;Supervision due to cognitive status   Can travel by private vehicle        Equipment Recommendations  BSC/3in1;Wheelchair (measurements PT);Wheelchair cushion (measurements PT);Hospital bed;Hoyer lift;Rolling walker (2 wheels) (pending progress)    Recommendations for Other Services       Precautions / Restrictions Precautions Precautions: Fall;Other  (comment) Recall of Precautions/Restrictions: Impaired Precaution/Restrictions Comments: cortrak; HHFNC, watch vitals; flexiseal, SBP <160 Restrictions Weight Bearing Restrictions Per Provider Order: No     Mobility  Bed Mobility Overal bed mobility: Needs Assistance Bed Mobility: Supine to Sit, Sit to Supine     Supine to sit: Max assist, +2 for physical assistance Sit to supine: Max assist, +2 for physical assistance   General bed mobility comments: cues and increased time to follow for LE, pt encouraged to pull up on therapist for trunk, poor UE strength to complete pull to sit without posterior support. maxA to return to supine and manage lines    Transfers Overall transfer level: Needs assistance Equipment used: 2 person hand held assist Transfers: Sit to/from Stand Sit to Stand: +2 physical assistance, Total assist           General transfer comment: with bil knees blocked, stood x2 from EOB, minimal attempt to initiate with LE and poor hip and trunk extension despite cues. assist with bed pad to facilitate hip lift and clearance from bed.    Ambulation/Gait               General Gait Details: unable due to being unable to stand fully upright with max-total assist    Modified Rankin (Stroke Patients Only) Modified Rankin (Stroke Patients Only) Pre-Morbid Rankin Score: No symptoms Modified Rankin: Severe disability     Balance Overall balance assessment: Needs assistance Sitting-balance support: Feet supported, Single extremity supported, Bilateral upper extremity supported, No upper extremity supported Sitting balance-Leahy Scale: Poor Sitting balance - Comments: posterior lean, especially with movement of LE. progressed to close CGA within session Postural control: Posterior lean Standing balance support: Bilateral upper extremity supported Standing balance-Leahy Scale: Zero Standing balance comment: Max-total assist to stand with flexed posture  and  HHA                            Communication Communication Communication: Impaired Factors Affecting Communication: Reduced clarity of speech  Cognition Arousal: Lethargic Behavior During Therapy: Flat affect   PT - Cognitive impairments: Difficult to assess Difficult to assess due to: Level of arousal, Impaired communication                     PT - Cognition Comments: pt soft spoken but will answer a few direct questions and was following commands briskly. Pt maintaining eyes closed through session. increased cues and assist for coordination of extremities and core for functional movements Following commands: Impaired Following commands impaired: Follows one step commands inconsistently, Follows one step commands with increased time    Cueing Cueing Techniques: Verbal cues, Tactile cues, Visual cues, Gestural cues  Exercises      General Comments General comments (skin integrity, edema, etc.): VSS on 30L HHFNC with FiO2 38%      Pertinent Vitals/Pain Pain Assessment Pain Assessment: Faces Faces Pain Scale: Hurts a little bit Pain Location: generalized with movement Pain Descriptors / Indicators: Discomfort, Grimacing, Guarding Pain Intervention(s): Limited activity within patient's tolerance, Monitored during session, Repositioned    Home Living Family/patient expects to be discharged to:: Private residence Living Arrangements: Spouse/significant other Available Help at Discharge: Family;Available 24 hours/day Type of Home: House Home Access: Level entry       Home Layout: One level Home Equipment: None      Prior Function            PT Goals (current goals can now be found in the care plan section) Acute Rehab PT Goals Patient Stated Goal: pt did not state; family wishes for pt to improve PT Goal Formulation: With patient/family Time For Goal Achievement: 07/29/24 Potential to Achieve Goals: Fair Progress towards PT goals: Progressing  toward goals    Frequency    Min 3X/week       Co-evaluation   Reason for Co-Treatment: Complexity of the patient's impairments (multi-system involvement);Necessary to address cognition/behavior during functional activity;For patient/therapist safety;To address functional/ADL transfers PT goals addressed during session: Mobility/safety with mobility;Balance;Strengthening/ROM OT goals addressed during session: ADL's and self-care      AM-PAC PT 6 Clicks Mobility   Outcome Measure  Help needed turning from your back to your side while in a flat bed without using bedrails?: A Lot Help needed moving from lying on your back to sitting on the side of a flat bed without using bedrails?: A Lot Help needed moving to and from a bed to a chair (including a wheelchair)?: Total Help needed standing up from a chair using your arms (e.g., wheelchair or bedside chair)?: Total Help needed to walk in hospital room?: Total Help needed climbing 3-5 steps with a railing? : Total 6 Click Score: 8    End of Session Equipment Utilized During Treatment: Oxygen;Gait belt Activity Tolerance: Patient limited by fatigue Patient left: in bed;with call bell/phone within reach;with bed alarm set;with family/visitor present Nurse Communication: Other (comment);Mobility status;Need for lift equipment (vitals) PT Visit Diagnosis: Unsteadiness on feet (R26.81);Other abnormalities of gait and mobility (R26.89);Muscle weakness (generalized) (M62.81);Difficulty in walking, not elsewhere classified (R26.2);Other symptoms and signs involving the nervous system (R29.898)     Time: 8474-8441 PT Time Calculation (min) (ACUTE ONLY): 33 min  Charges:    $Therapeutic Exercise: 8-22 mins PT General Charges $$  ACUTE PT VISIT: 1 Visit                     Izetta Call, PT, DPT   Acute Rehabilitation Department Office [phone] Secure Chat Communication Preferred   Izetta JULIANNA Call 07/16/2024, 5:33 PM

## 2024-07-16 NOTE — Evaluation (Signed)
 Occupational Therapy Evaluation Patient Details Name: Wayne Molina MRN: [id number] DOB: 1956-10-05 Today's Date: 07/16/2024   History of Present Illness   Pt is a 68 y/o male presenting on 8/28 with L arm weakness, L hemianopsia, and slurred speech. 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 Course complicated by seizure episode. Intubated 8/29, extubated 9/4 but still with difficulty managing secretions. PMH includes: anxiety, HLD, HTN, sleep apnea, chronic afib.     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 Pt currently lethargic, keeps eyes closed through most of session but does follow 1 step commands with incr time. Pt with notable L weakness, needs max-total A for ADLS, max +2 for bed mobility and transfers with 2 person assist. Pt with trunk flexed, unable to stand upright with x2 attempts. Pt oriented to self, place, time this session, noted to open eyes briefly and track to R side and to midline, but not crossing midline to L. Pt presenting with impairments listed below, will follow acutely. Patient will benefit from intensive inpatient follow-up therapy, >3 hours/day to maximize safety/ind with ADL/functional mobility.      If plan is discharge home, recommend the following:   Two people to help with walking and/or transfers;A lot of help with bathing/dressing/bathroom;Assistance with cooking/housework;Direct supervision/assist for medications management;Direct supervision/assist for financial management;Assist for transportation;Help with stairs or ramp for entrance     Functional Status Assessment   Patient has had a recent decline in their functional status and demonstrates the ability to make significant improvements in function in a reasonable and predictable amount of time.     Equipment Recommendations   Other  (comment) (defer)     Recommendations for Other Services   PT consult;Speech consult;Rehab consult     Precautions/Restrictions   Precautions Precautions: Fall;Other (comment) Recall of Precautions/Restrictions: Impaired Precaution/Restrictions Comments: cortrak; HHFNC, watch vitals; flexiseal, SBP <160 Restrictions Weight Bearing Restrictions Per Provider Order: No     Mobility Bed Mobility Overal bed mobility: Needs Assistance Bed Mobility: Supine to Sit, Sit to Supine     Supine to sit: Max assist, +2 for physical assistance Sit to supine: Max assist, +2 for physical assistance        Transfers Overall transfer level: Needs assistance Equipment used: 2 person hand held assist Transfers: Sit to/from Stand Sit to Stand: Max assist, +2 physical assistance           General transfer comment: with bil knees blocked, stood x2 from EOB      Balance Overall balance assessment: Needs assistance Sitting-balance support: Feet supported, Single extremity supported, Bilateral upper extremity supported, No upper extremity supported Sitting balance-Leahy Scale: Poor Sitting balance - Comments: posterior lean   Standing balance support: Bilateral upper extremity supported Standing balance-Leahy Scale: Zero Standing balance comment: Max-total assist to stand with flexed posture and HHA                           ADL either performed or assessed with clinical judgement   ADL Overall ADL's : Needs assistance/impaired Eating/Feeding: NPO   Grooming: Sitting;Maximal assistance   Upper Body Bathing: Sitting;Maximal assistance   Lower Body Bathing: Maximal assistance   Upper Body Dressing : Maximal assistance   Lower Body Dressing: Maximal assistance   Toilet Transfer: Maximal assistance;+2 for physical assistance   Toileting- Clothing  Manipulation and Hygiene: Total assistance       Functional mobility during ADLs: Maximal assistance;+2 for physical  assistance       Vision   Vision Assessment?: Vision impaired- to be further tested in functional context Additional Comments: keeps eyes closed, tracks from R to midline but not to L when eyes breifly opened     Perception         Praxis         Pertinent Vitals/Pain Pain Assessment Pain Assessment: Faces Pain Score: 2  Faces Pain Scale: Hurts a little bit Pain Location: generalized with movement Pain Descriptors / Indicators: Discomfort, Grimacing, Guarding Pain Intervention(s): Limited activity within patient's tolerance, Monitored during session, Repositioned     Extremity/Trunk Assessment Upper Extremity Assessment Upper Extremity Assessment: Left hand dominant;Generalized weakness;LUE deficits/detail LUE Deficits / Details: 3-/5 globally LUE Coordination: decreased fine motor;decreased gross motor   Lower Extremity Assessment Lower Extremity Assessment: Generalized weakness   Cervical / Trunk Assessment Cervical / Trunk Assessment: Other exceptions (keeps neck in cervical extension)   Communication Communication Communication: Impaired Factors Affecting Communication: Reduced clarity of speech   Cognition Arousal: Lethargic Behavior During Therapy: Flat affect               OT - Cognition Comments: oriented to name, DOB, place, time, incr time for command follow throughout session                 Following commands: Impaired Following commands impaired: Follows one step commands inconsistently, Follows one step commands with increased time     Cueing  General Comments   Cueing Techniques: Verbal cues;Tactile cues;Visual cues;Gestural cues  VSS on [facility]   Exercises     Shoulder Instructions      Home Living Family/patient expects to be discharged to:: Private residence Living Arrangements: Spouse/significant other Available Help at Discharge: Family;Available 24 hours/day Type of Home: House Home Access: Level entry     Home  Layout: One level     Bathroom Shower/Tub: Chief Strategy Officer: Standard     Home Equipment: None          Prior Functioning/Environment Prior Level of Function : Independent/Modified Independent;History of Falls (last six months)             Mobility Comments: No AD, x1 fall when stepped off deck a few weeks ago, but no other falls ADLs Comments: Works night shift in Materials at [employer]    OT Problem List: Decreased strength;Decreased range of motion;Decreased activity tolerance;Impaired balance (sitting and/or standing);Decreased cognition;Decreased safety awareness;Decreased coordination;Impaired vision/perception;Impaired UE functional use   OT Treatment/Interventions: Self-care/ADL training;Therapeutic exercise;Energy conservation;DME and/or AE instruction;Therapeutic activities;Patient/family education;Balance training      OT Goals(Current goals can be found in the care plan section)   Acute Rehab OT Goals Patient Stated Goal: unable to state OT Goal Formulation: With patient Time For Goal Achievement: 07/30/24 Potential to Achieve Goals: Good ADL Goals Pt Will Perform Grooming: with min assist;sitting Pt Will Transfer to Toilet: with mod assist;squat pivot transfer;stand pivot transfer;bedside commode Pt/caregiver will Perform Home Exercise Program: Increased strength;Increased ROM;Left upper extremity;With minimal assist Additional ADL Goal #1: pt will visually track and locate items on L side with min cues in order to promote ind with ADLS Additional ADL Goal #2: pt will perform bed mobility min A in prep for seated ADLs   OT Frequency:  Min 2X/week    Co-evaluation PT/OT/SLP Co-Evaluation/Treatment: Yes Reason for Co-Treatment: Complexity of the patient's  impairments (multi-system involvement);Necessary to address cognition/behavior during functional activity;For patient/therapist safety;To address functional/ADL transfers   OT goals addressed  during session: ADL's and self-care      AM-PAC OT 6 Clicks Daily Activity     Outcome Measure Help from another person eating meals?: A Lot Help from another person taking care of personal grooming?: A Lot Help from another person toileting, which includes using toliet, bedpan, or urinal?: A Lot Help from another person bathing (including washing, rinsing, drying)?: A Lot Help from another person to put on and taking off regular upper body clothing?: A Lot Help from another person to put on and taking off regular lower body clothing?: A Lot 6 Click Score: 12   End of Session Equipment Utilized During Treatment: Gait belt Nurse Communication: Mobility status  Activity Tolerance: Patient tolerated treatment well Patient left: in bed;with call bell/phone within reach;with bed alarm set;with family/visitor present  OT Visit Diagnosis: Unsteadiness on feet (R26.81);Other abnormalities of gait and mobility (R26.89);Muscle weakness (generalized) (M62.81)                Time: 8474-8441 OT Time Calculation (min): 33 min Charges:  OT General Charges $OT Visit: 1 Visit OT Evaluation $OT Eval Moderate Complexity: 1 Mod  Janiya Millirons K, OTD, OTR/L SecureChat Preferred Acute Rehab [phone]   Laneta POUR Koonce 07/16/2024, 4:47 PM

## 2024-07-17 ENCOUNTER — Inpatient Hospital Stay (HOSPITAL_COMMUNITY): Payer: Self-pay | Admitting: Anesthesiology

## 2024-07-17 ENCOUNTER — Encounter (HOSPITAL_COMMUNITY)
Admission: EM | Disposition: A | Discharge status: Rehabilitation Facility | Payer: Self-pay | Source: Home / Self Care | Attending: Family Medicine

## 2024-07-17 ENCOUNTER — Inpatient Hospital Stay (HOSPITAL_COMMUNITY)

## 2024-07-17 ENCOUNTER — Other Ambulatory Visit: Payer: Self-pay

## 2024-07-17 DIAGNOSIS — G9389 Other specified disorders of brain: Secondary | ICD-10-CM

## 2024-07-17 DIAGNOSIS — Z87891 Personal history of nicotine dependence: Secondary | ICD-10-CM | POA: Diagnosis not present

## 2024-07-17 DIAGNOSIS — J9601 Acute respiratory failure with hypoxia: Secondary | ICD-10-CM

## 2024-07-17 DIAGNOSIS — I4891 Unspecified atrial fibrillation: Secondary | ICD-10-CM

## 2024-07-17 DIAGNOSIS — D496 Neoplasm of unspecified behavior of brain: Secondary | ICD-10-CM | POA: Diagnosis present

## 2024-07-17 DIAGNOSIS — I10 Essential (primary) hypertension: Secondary | ICD-10-CM

## 2024-07-17 HISTORY — PX: APPLICATION OF CRANIAL NAVIGATION: SHX6578

## 2024-07-17 HISTORY — PX: STERIOTACTIC STIMULATOR INSERTION: SHX5374

## 2024-07-17 LAB — SURGICAL PCR SCREEN
MRSA, PCR: NEGATIVE
Staphylococcus aureus: POSITIVE — AB

## 2024-07-17 LAB — CBC
HCT: 42.6 % (ref 39.0–52.0)
Hemoglobin: 14.2 g/dL (ref 13.0–17.0)
MCH: 30.3 pg (ref 26.0–34.0)
MCHC: 33.3 g/dL (ref 30.0–36.0)
MCV: 91 fL (ref 80.0–100.0)
Platelets: 342 K/uL (ref 150–400)
RBC: 4.68 MIL/uL (ref 4.22–5.81)
RDW: 12.7 % (ref 11.5–15.5)
WBC: 15 K/uL — ABNORMAL HIGH (ref 4.0–10.5)
nRBC: 0 % (ref 0.0–0.2)

## 2024-07-17 LAB — GLUCOSE, CAPILLARY
Glucose-Capillary: 103 mg/dL — ABNORMAL HIGH (ref 70–99)
Glucose-Capillary: 220 mg/dL — ABNORMAL HIGH (ref 70–99)

## 2024-07-17 MED ORDER — LABETALOL HCL 5 MG/ML IV SOLN
10.0000 mg | INTRAVENOUS | Status: DC | PRN
  Filled 2024-07-17: qty 8

## 2024-07-17 MED ORDER — CEFAZOLIN SODIUM-DEXTROSE 2-4 GM/100ML-% IV SOLN
2.0000 g | Freq: Three times a day (TID) | INTRAVENOUS | Status: AC
  Administered 2024-07-17 – 2024-07-18 (×3): 2 g via INTRAVENOUS
  Filled 2024-07-17 (×3): qty 100

## 2024-07-17 MED ORDER — BACLOFEN 10 MG PO TABS: 10.0000 mg | ORAL_TABLET | Freq: Three times a day (TID) | ORAL | Status: DC | PRN

## 2024-07-17 MED ORDER — SODIUM CHLORIDE 0.9 % IV SOLN: INTRAVENOUS | Status: DC | PRN

## 2024-07-17 MED ORDER — DEXAMETHASONE SODIUM PHOSPHATE 4 MG/ML IJ SOLN
4.0000 mg | Freq: Four times a day (QID) | INTRAMUSCULAR | Status: AC
  Administered 2024-07-18 – 2024-07-19 (×4): 4 mg via INTRAVENOUS
  Filled 2024-07-17 (×4): qty 1

## 2024-07-17 MED ORDER — FENTANYL CITRATE (PF) 250 MCG/5ML IJ SOLN
INTRAMUSCULAR | Status: AC
  Filled 2024-07-17: qty 5

## 2024-07-17 MED ORDER — ALBUTEROL SULFATE (2.5 MG/3ML) 0.083% IN NEBU: 2.5000 mg | INHALATION_SOLUTION | Freq: Two times a day (BID) | RESPIRATORY_TRACT | Status: DC

## 2024-07-17 MED ORDER — FENTANYL CITRATE PF 50 MCG/ML IJ SOSY: 25.0000 ug | PREFILLED_SYRINGE | INTRAMUSCULAR | Status: AC | PRN

## 2024-07-17 MED ORDER — DEXAMETHASONE SODIUM PHOSPHATE 4 MG/ML IJ SOLN
4.0000 mg | Freq: Three times a day (TID) | INTRAMUSCULAR | Status: DC
Start: 2024-07-19 — End: 2024-07-26
  Administered 2024-07-19 – 2024-07-26 (×21): 4 mg via INTRAVENOUS
  Filled 2024-07-17 (×22): qty 1

## 2024-07-17 MED ORDER — CEFAZOLIN IN SODIUM CHLORIDE 2-0.9 GM/100ML-% IV SOLN: 2.0000 g | Freq: Once | INTRAVENOUS | Status: AC

## 2024-07-17 MED ORDER — OXYCODONE HCL 5 MG/5ML PO SOLN: 5.0000 mg | Freq: Once | ORAL | Status: DC | PRN

## 2024-07-17 MED ORDER — SUGAMMADEX SODIUM 200 MG/2ML IV SOLN
INTRAVENOUS | Status: DC | PRN
  Administered 2024-07-17: 200 mg via INTRAVENOUS

## 2024-07-17 MED ORDER — ROCURONIUM BROMIDE 100 MG/10ML IV SOLN
INTRAVENOUS | Status: DC | PRN
  Administered 2024-07-17: 60 mg via INTRAVENOUS

## 2024-07-17 MED ORDER — LIDOCAINE HCL (CARDIAC) PF 100 MG/5ML IV SOSY
PREFILLED_SYRINGE | INTRAVENOUS | Status: DC | PRN
  Administered 2024-07-17: 100 mg via INTRATRACHEAL

## 2024-07-17 MED ORDER — DEXAMETHASONE SODIUM PHOSPHATE 10 MG/ML IJ SOLN
6.0000 mg | Freq: Four times a day (QID) | INTRAMUSCULAR | Status: AC
  Administered 2024-07-17 – 2024-07-18 (×4): 6 mg via INTRAVENOUS
  Filled 2024-07-17 (×4): qty 1

## 2024-07-17 MED ORDER — OXYCODONE HCL 5 MG PO TABS: 5.0000 mg | ORAL_TABLET | Freq: Once | ORAL | Status: DC | PRN

## 2024-07-17 MED ORDER — CEFAZOLIN SODIUM-DEXTROSE 2-3 GM-%(50ML) IV SOLR
INTRAVENOUS | Status: DC | PRN
  Administered 2024-07-17: 2 g via INTRAVENOUS

## 2024-07-17 MED ORDER — CHLORHEXIDINE GLUCONATE CLOTH 2 % EX PADS
6.0000 | MEDICATED_PAD | Freq: Every day | CUTANEOUS | Status: AC
  Administered 2024-07-17 – 2024-07-21 (×5): 6 via TOPICAL

## 2024-07-17 MED ORDER — HYDRALAZINE HCL 50 MG PO TABS
50.0000 mg | ORAL_TABLET | Freq: Three times a day (TID) | ORAL | Status: DC
  Administered 2024-07-17 – 2024-07-20 (×9): 50 mg
  Filled 2024-07-17 (×9): qty 1

## 2024-07-17 MED ORDER — ACETAMINOPHEN 650 MG RE SUPP: 650.0000 mg | RECTAL | Status: DC | PRN

## 2024-07-17 MED ORDER — FENTANYL CITRATE (PF) 250 MCG/5ML IJ SOLN
INTRAMUSCULAR | Status: DC | PRN
  Administered 2024-07-17: 50 ug via INTRAVENOUS

## 2024-07-17 MED ORDER — PROPOFOL 10 MG/ML IV BOLUS
INTRAVENOUS | Status: DC | PRN
  Administered 2024-07-17: 80 mg via INTRAVENOUS

## 2024-07-17 MED ORDER — ONDANSETRON HCL 4 MG PO TABS: 4.0000 mg | ORAL_TABLET | ORAL | Status: DC | PRN

## 2024-07-17 MED ORDER — ACETAMINOPHEN 325 MG PO TABS
650.0000 mg | ORAL_TABLET | ORAL | Status: DC | PRN
  Filled 2024-07-17: qty 2

## 2024-07-17 MED ORDER — MUPIROCIN 2 % EX OINT
1.0000 | TOPICAL_OINTMENT | Freq: Two times a day (BID) | CUTANEOUS | Status: AC
  Administered 2024-07-17 – 2024-07-22 (×10): 1 via NASAL
  Filled 2024-07-17: qty 22

## 2024-07-17 MED ORDER — 0.9 % SODIUM CHLORIDE (POUR BTL) OPTIME
TOPICAL | Status: DC | PRN
  Administered 2024-07-17: 1000 mL

## 2024-07-17 MED ORDER — SODIUM CHLORIDE 0.9 % IV SOLN
0.0125 ug/kg/min | INTRAVENOUS | Status: DC
  Administered 2024-07-17: .1 ug/kg/min via INTRAVENOUS
  Filled 2024-07-17: qty 2000

## 2024-07-17 MED ORDER — PANTOPRAZOLE SODIUM 40 MG IV SOLR
40.0000 mg | Freq: Every day | INTRAVENOUS | Status: DC
Start: 2024-07-17 — End: 2024-07-20
  Administered 2024-07-17 – 2024-07-19 (×3): 40 mg via INTRAVENOUS
  Filled 2024-07-17 (×3): qty 10

## 2024-07-17 MED ORDER — PHENYLEPHRINE HCL-NACL 20-0.9 MG/250ML-% IV SOLN
INTRAVENOUS | Status: DC | PRN
  Administered 2024-07-17: 20 ug/min via INTRAVENOUS

## 2024-07-17 MED ORDER — ONDANSETRON HCL 4 MG/2ML IJ SOLN: 4.0000 mg | Freq: Once | INTRAMUSCULAR | Status: DC | PRN

## 2024-07-17 MED ORDER — FENTANYL CITRATE (PF) 100 MCG/2ML IJ SOLN: 25.0000 ug | INTRAMUSCULAR | Status: DC | PRN

## 2024-07-17 MED ORDER — PHENYLEPHRINE 80 MCG/ML (10ML) SYRINGE FOR IV PUSH (FOR BLOOD PRESSURE SUPPORT)
PREFILLED_SYRINGE | INTRAVENOUS | Status: DC | PRN
  Administered 2024-07-17: 160 ug via INTRAVENOUS
  Administered 2024-07-17: 120 ug via INTRAVENOUS
  Administered 2024-07-17: 160 ug via INTRAVENOUS

## 2024-07-17 MED ORDER — ONDANSETRON HCL 4 MG/2ML IJ SOLN: 4.0000 mg | INTRAMUSCULAR | Status: DC | PRN

## 2024-07-17 MED ORDER — BACITRACIN ZINC 500 UNIT/GM EX OINT
TOPICAL_OINTMENT | CUTANEOUS | Status: AC
  Filled 2024-07-17: qty 28.35

## 2024-07-17 MED ORDER — PROMETHAZINE HCL 25 MG PO TABS: 12.5000 mg | ORAL_TABLET | ORAL | Status: DC | PRN

## 2024-07-17 MED ORDER — BUTALBITAL-APAP-CAFFEINE 50-325-40 MG PO TABS: 2.0000 | ORAL_TABLET | ORAL | Status: DC | PRN

## 2024-07-17 MED ORDER — ACETAMINOPHEN 10 MG/ML IV SOLN: 1000.0000 mg | Freq: Once | INTRAVENOUS | Status: DC | PRN

## 2024-07-17 MED ORDER — LIDOCAINE-EPINEPHRINE 1 %-1:100000 IJ SOLN
INTRAMUSCULAR | Status: AC
  Filled 2024-07-17: qty 1

## 2024-07-17 MED ORDER — ONDANSETRON HCL 4 MG/2ML IJ SOLN
INTRAMUSCULAR | Status: DC | PRN
  Administered 2024-07-17: 4 mg via INTRAVENOUS

## 2024-07-17 MED ORDER — LIDOCAINE-EPINEPHRINE 1 %-1:100000 IJ SOLN
INTRAMUSCULAR | Status: DC | PRN
  Administered 2024-07-17: 7 mL

## 2024-07-17 MED ORDER — DEXAMETHASONE SODIUM PHOSPHATE 10 MG/ML IJ SOLN
INTRAMUSCULAR | Status: DC | PRN
  Administered 2024-07-17: 5 mg via INTRAVENOUS

## 2024-07-17 MED ORDER — PROPOFOL 500 MG/50ML IV EMUL
INTRAVENOUS | Status: DC | PRN
  Administered 2024-07-17: 50 ug/kg/min via INTRAVENOUS

## 2024-07-17 SURGICAL SUPPLY — 1 items: NDL HYPO 22X1.5 SAFETY MO (MISCELLANEOUS) ×2 IMPLANT

## 2024-07-17 NOTE — Anesthesia Preprocedure Evaluation (Addendum)
 Anesthesia Evaluation  Patient identified by MRN, date of birth, ID band Patient awake    Reviewed: Allergy & Precautions, NPO status , Patient's Chart, lab work & pertinent test results, reviewed documented beta blocker date and time   History of Anesthesia Complications Negative for: history of anesthetic complications  Airway Mallampati: II  TM Distance: >3 FB     Dental  (+) Edentulous Upper, Edentulous Lower   Pulmonary sleep apnea and Continuous Positive Airway Pressure Ventilation , pneumonia, former smoker Required intubation for AMS; improved - now extubated on 35% FiO2   CXR 9/8 No acute cardiopulmonary abnormality. Nonspecific heterogeneous opacity overlying the lateral aspect of the right mid lung zone, which was likely present on the prior radiograph. This is indeterminate. Correlate clinically to determine the need for further evaluation with nonemergent chest CT scan.      + rhonchi  + decreased breath sounds      Cardiovascular hypertension, (-) angina (-) CAD and (-) Past MI + dysrhythmias Atrial Fibrillation  Rhythm:Irregular Rate:Normal  IMPRESSIONS     1. Left ventricular ejection fraction, by estimation, is 60 to 65%. Left  ventricular ejection fraction by PLAX is 61 %. The left ventricle has  normal function. The left ventricle has no regional wall motion  abnormalities. There is moderate left  ventricular hypertrophy. Left ventricular diastolic function could not be  evaluated.   2. Right ventricular systolic function is moderately reduced. The right  ventricular size is mildly enlarged.   3. Left atrial size was moderately dilated.   4. Right atrial size was severely dilated.   5. The mitral valve is grossly normal. Trivial mitral valve  regurgitation.   6. The aortic valve is calcified. Aortic valve regurgitation is mild.  Aortic valve sclerosis/calcification is present, without any evidence of   aortic stenosis.   7. The inferior vena cava is dilated in size with <50% respiratory  variability, suggesting right atrial pressure of 15 mmHg.   8. Cannot exclude a small PFO.   9. Rhythm strip during this exam demonstrates atrial fibrillation.     Neuro/Psych Seizures -,  PSYCHIATRIC DISORDERS Anxiety Depression    Brain mass - encephalopathy, delirium  CVA    GI/Hepatic ,neg GERD  ,,(+) neg Cirrhosis        Endo/Other    Renal/GU Renal disease     Musculoskeletal  (+) Arthritis ,    Abdominal   Peds  Hematology   Anesthesia Other Findings   Reproductive/Obstetrics                              Anesthesia Physical Anesthesia Plan  ASA: 3  Anesthesia Plan: General   Post-op Pain Management:    Induction: Intravenous  PONV Risk Score and Plan: 2 and Ondansetron   Airway Management Planned: Oral ETT  Additional Equipment:   Intra-op Plan:   Post-operative Plan: Extubation in OR  Informed Consent: I have reviewed the patients History and Physical, chart, labs and discussed the procedure including the risks, benefits and alternatives for the proposed anesthesia with the patient or authorized representative who has indicated his/her understanding and acceptance.     Dental advisory given  Plan Discussed with: CRNA  Anesthesia Plan Comments:          Anesthesia Quick Evaluation

## 2024-07-17 NOTE — Anesthesia Procedure Notes (Signed)
 Procedure Name: Intubation Date/Time: 07/17/2024 11:15 AM  Performed by: Virgil Ee, CRNAPre-anesthesia Checklist: Patient identified, Patient being monitored, Timeout performed, Emergency Drugs available and Suction available Patient Re-evaluated:Patient Re-evaluated prior to induction Oxygen Delivery Method: Circle system utilized Preoxygenation: Pre-oxygenation with 100% oxygen Induction Type: IV induction Ventilation: Mask ventilation without difficulty Laryngoscope Size: Mac and 4 Grade View: Grade I Tube type: Oral Tube size: 7.5 mm Number of attempts: 1 Airway Equipment and Method: Stylet Placement Confirmation: ETT inserted through vocal cords under direct vision, positive ETCO2 and breath sounds checked- equal and bilateral Secured at: 23 cm Tube secured with: Tape Dental Injury: Teeth and Oropharynx as per pre-operative assessment

## 2024-07-17 NOTE — Op Note (Signed)
 DATE OF SURGERY: 07/17/2024   ATTENDING SURGEON: Dino Sable, MD   ASSISTANT: None   PREOPERATIVE DIAGNOSIS: Brain tumor   POSTOPERATIVE DIAGNOSIS: Same    PROCEDURE PERFORMED:  1. RIGHT craniotomy for biopsy of brain tumor 2. Use of neuro navigation for assessment of extent of resection and localization 3. Duraplasty less than 5 cm 4. Use of microscope for microscopic dissection  ANESTHESIA: General endotracheal anesthesia.     ESTIMATED BLOOD LOSS, URINE OUTPUT, AND CRYSTALLOIDS:   See anesthesia chart.     COMPLICATIONS: None.     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  Options were discussed with the patient and family of doing nothing versus radiation versus surgery followed by radiation and they opted for the latter.  Risks discussed included but not limited to infection, hemorrhage stroke paralysis blindness speech impairment, seizures DVT PE cardiopulmonary complications and death amongst others.  All the questions were answered to the satisfaction as was per them and they requested for us  to proceed.   DESCRIPTION OF PROCEDURE: Patient brought to the operating room after general endotracheal anesthesia was ensued patient placed in the supine position with the head on a horseshoe head holder.  Registration was obtained after which the tumor was outlined on the scalp and a curvilinear incision was outlined.  Hereafter the patient was prepped and draped in usual sterile fashion and the outline of the incision was injected with lidocaine  with epinephrine .  Then, using a #10 blade an incision was made and retractors were placed.  With navigation the tumor was outlined on the skull and 1 bur hole made, craniectomy performed.  Copious irrigation was performed hemostasis obtained after which the dura was opened in a cruciate fashion.  The  microscope was then brought in, correct entry point identified with navigation and small corticectomy was performed and the tumor was then identified.  This was then biopsied and sent for frozen section and additional biopsies performed.  Hemostasis was obtained copious irrigation was performed after which the cavity was lined with Surgicel.  Here after copious irrigation was performed and duraplasty performed after which the microscope was taken out hemostasis and irrigation a subgaleal drain was left in situ and the galea and skin were closed in separate layers.   At the end of the procedure all counts were complete.     It should be noted that Stereotactic computer-assisted navigation was indicated due to patient's anatomy. Films were reviewed for preop planning. Registration was personally performed by the me. Visual confirmation was done. Navigation was utilized during different portions of the surgery, including on entry to the brain and localization of the tumor.

## 2024-07-17 NOTE — Progress Notes (Signed)
 NAME:  Wayne Molina, MRN:  [id number], DOB:  08-27-1956, LOS: 10 ADMISSION DATE:  07/06/2024, CONSULTATION DATE:  07/07/2024 REFERRING MD:  SHIRLEYANN, CHIEF COMPLAINT:  AMS   History of Present Illness:  68 year old man who presented to [facility] 8/28 with AMS, Code Stroke. PMHx significant for HTN, HLD, Afib on Eliquis , untreated OSA.  Patient presented to ER as a Code Stroke after coworker noticed he was altered since 8/28 around 2230 and pale, patient reported feeling dizzy.  Wife reports pt has been in his normal state of health, just recently not sleeping well for the last week and fatigued.  Nonsmoker, wife denies ETOH use. 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  In ER, noted to have L sided weakness and left hemianopsia.  Last dose Eliquis  reported 8/28.  Code Stroke activated.  CT Head initially concerning for right posterior MCA stroke but CTA negative for LVO.  Also noted to have rhythmic eye twitching, SBP 200s, and more lethargic, oriented to person only.  Repeat CT Head concerning for mass. EEG started and Keppra  load given. FPTS admitted.  Patient was given a total of 4mg  Ativan  for MRI, seizure concern, and intermittent agitation.  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 NSGY was consulted with plan for eventual biopsy.    PCCM consulted for ongoing encephalopathy and concern for airway protection. Intubated 8/29.  Pertinent Medical History:   Past Medical History:  Diagnosis Date   Anxiety    Arthritis    fingers   Chronic atrial fibrillation (HCC)    Depression    Family history of breast cancer    Family history of cancer of male genital organ    Family history of gene mutation    BRIP1   Family history of malignant neoplasm of  gastrointestinal tract    Hyperlipidemia    Hypertension    Personal history of colonic polyps 11/12/2006   Sleep apnea    Significant Hospital Events: Including procedures, antibiotic start and stop dates in addition to other pertinent events   8/28 Admitted seizures. LTM initiated .  8/29 PCCM consulted. Intubated for AW protection. CT Head with enhancing mass (bilobed), with dominant components measuring 2.9 cm and 3.7 cm at the right temporal lobe and splenium respectively. Surrounding vasogenic edema without midline shift. MRI brain Positive for relatively large and infiltrative tumor with heterogeneous enhancement tracking from the splenium of the corpus callosum near midline into the right temporal lobe. Confluent regional tumoral edema. 8/30 Evidence of epileptogenicity and cortical dysfunction arising from right hemisphere likely due to underlying structural abnormality. Seen by neuro surg w/ plan for Bx after off vent and stable 8/31 1-2 Different episodes of jerking concerning for seizures.  None seen on EEG.  Midazolam  titration down.  Added Zosyn  for fever 9/1 Stopping Versed .  Weaning some. Sputum culture growing staph started on vancomycin  until sensitivities back 9/2 Resp Cx with abundant staph aureus, abundant H. Flu. Abx transitioned to Unasyn . 9/3 Some issues with hypertension overnight, remains on vent with fent and precedex  this Precedex  was stopped given concern for fever. 9/4 antibiotics changed to Ancef  to cover both staph and H. influenzae with plan to complete total of 7 days.  Off precedex . Extubated> HHFNC, 3% nebs, lasix , CPT, NT suctioning  Interim History/Subjective:  Eyes closed but responds  to verbal stimulus. States name, location, year. On HFNC. No significant respiratory distress.   Objective:   Blood pressure (!) 120/55, pulse 99, temperature 99.4 F (37.4 C), temperature source Axillary, resp. rate (!) 21, height 5' 9 (1.753 m), weight 75 kg, SpO2 96%.     FiO2 (%):  [40 %] 40 %   Intake/Output Summary (Last 24 hours) at 07/17/2024 0901 Last data filed at 07/17/2024 0700 Gross per 24 hour  Intake 1634.86 ml  Output 2650 ml  Net -1015.14 ml   Filed Weights   07/15/24 0600 07/16/24 0313 07/17/24 0500  Weight: 74.7 kg 73.6 kg 75 kg   Physical Examination: General: older male, laying in bed, acutely ill appearing  Neuro: drowsy but verbally responds to verbal command. Oriented to self, year, location. Denies pain. Follows commands. L>R weakness  Cardiac: irregularly irregular rhythm, no appreciable murmur, no LE edema  Lungs: rhonchi R, clear left, HHFNC Abdomen: soft, ntnd, tube feed  Extremities: warm, no edema  Resolved Problem List:  Status epilepticus resolved as of 8/31 Abnormal LFTs - normalized 9/2 Fluid and electrolyte imbalance & acid base imbalance  hypophosphatemia, NAGMA -Bicarb gtt from 23 to 18 on 8/31. Not hyperchloremic.  Assessment and Plan:   Brain mass with new onset seizures: Agitation/delirium: Differential for brain mass is lymphoma versus GBM. LTM not showing any more seizures.  LTM discontinued. - brain biopsy today with Dr. Janjua - back to ICU post-procedure - con't keppra  1250mg  BID  - dc seroquel    Acute hypoxic respiratory failure 2/2 MSSA and H. Flu pneumonia  MSSA PNA Hflu PNA CXR with LLL pneumonia. Sputum cultures with pan sensitive MSSA and H.flu. completed 7 day course of cefazolin  on 9/7.  - con't on HHFNC - titrate to O2 >90 - repeated CXR this AM with improve aeration  - fever curve improving  - flutter valve, IS, NTS, chest PT to mobilize secretions  - will hold on further lasix  today   A-fib on Eliquis : - heparin  gtt held at midnight 9/8 for biopsy  - hold heparin  for 7 days post-procedure  - bridge with aspirin  starting 48hrs post-biopsy if okay with neurosurgery   HTN: HLD: - ideally SBP < 160 - mild hypotension overnight, decrease hydralazine  frequency to q8h  - cont lipitor    Hyperglycemia: - A1c 6.0 - cont CBG q4, sSSI prn   At risk for malnutrition:   - cont TF.  - Free water  200 every 6 added.  Patient's wife at bedside is updated every day of patient's condition. Knows we are waiting on biopsy but high concern that this is malignancy. She is aware we will not have biopsy result same day of procedure.    CCM: 25m Tinnie FORBES Furth, PA-C [facility] 07/17/24 10:49 AM  Please see [email or web address] for pager details.  From 7A-7P if no response, please call [phone] After hours, please call ELink [phone]

## 2024-07-17 NOTE — Transfer of Care (Signed)
 Immediate Anesthesia Transfer of Care Note  Patient: Wayne Molina  Procedure(s) Performed: OPEN CRANIOTOMY FOR BIOPSY (Right: Head) COMPUTER-ASSISTED NAVIGATION, FOR CRANIAL PROCEDURE (Right)  Patient Location: PACU  Anesthesia Type:General  Level of Consciousness: drowsy and responds to stimulation  Airway & Oxygen Therapy: Patient Spontanous Breathing and non-rebreather face mask  Post-op Assessment: Report given to RN and Post -op Vital signs reviewed and stable  Post vital signs: Reviewed and stable  Last Vitals:  Vitals Value Taken Time  BP 124/68 07/17/24 13:15  Temp 36.6 C 07/17/24 13:05  Pulse 92 07/17/24 13:25  Resp 18 07/17/24 13:25  SpO2 97 % 07/17/24 13:25  Vitals shown include unfiled device data.  Last Pain:  Vitals:   07/17/24 1305  TempSrc:   PainSc: Asleep         Complications: No notable events documented.

## 2024-07-17 NOTE — H&P (Addendum)
 68yo gentleman with a RIGHT brain tumor  Past Medical History:  Diagnosis Date   Anxiety    Arthritis    fingers   Chronic atrial fibrillation (HCC)    Depression    Family history of breast cancer    Family history of cancer of male genital organ    Family history of gene mutation    BRIP1   Family history of malignant neoplasm of gastrointestinal tract    Hyperlipidemia    Hypertension    Personal history of colonic polyps 11/12/2006   Sleep apnea    Past Surgical History:  Procedure Laterality Date   2-D echocardiogram  07/22/2010   Ejection fraction greater than 55%. Left atrium moderately dilated. Right atrium moderately dilated. 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 Mild to Moderate MR. Mild to moderate TR.   ATRIAL ABLATION SURGERY  2004   EP IMPLANTABLE DEVICE N/A 02/11/2016   Procedure: Loop Recorder Insertion;  Surgeon: Jerel Balding, MD;  Location: [facility];  Service: Cardiovascular;  Laterality: N/A;   Exercise Myoview stress test  07/08/2000   Nonischemic low-risk.   HIP FRACTURE SURGERY  1970s   IR GENERIC HISTORICAL  01/21/2017   IR RADIOLOGIST EVAL & MGMT 01/21/2017 [facility]   Social History   Socioeconomic History   Marital status: Divorced    Spouse name: Not on file   Number of children: 3   Years of education: 12   Highest education level: GED or equivalent  Occupational History   Not on file  Tobacco Use   Smoking status: Former    Current packs/day: 1.00    Types: Cigarettes   Smokeless tobacco: Never   Tobacco comments:    Smoked for approximately 6 months in his life  Vaping Use   Vaping status: Never Used  Substance and Sexual Activity   Alcohol use: No   Drug use: No   Sexual activity: Yes  Other Topics Concern   Not on file  Social History Narrative   Patient lives alone in [place] in a one story home. There are no steps.    Patient enjoys going to the gym, watching TV, and going to antique shops.    Patient is divorced  with 3 children, two of which have passed; does have a girlfriend.    Patient has his own car and is able to drive.    Social Drivers of Corporate investment banker Strain: Low Risk  (03/22/2024)   Overall Financial Resource Strain (CARDIA)    Difficulty of Paying Living Expenses: Not hard at all  Food Insecurity: No Food Insecurity (03/22/2024)   Hunger Vital Sign    Worried About Running Out of Food in the Last Year: Never true    Ran Out of Food in the Last Year: Never true  Transportation Needs: No Transportation Needs (03/22/2024)   PRAPARE - Administrator, Civil Service (Medical): No    Lack of Transportation (Non-Medical): No  Physical Activity: Sufficiently Active (03/22/2024)   Exercise Vital Sign    Days of Exercise per Week: 5 days    Minutes of Exercise per Session: 60 min  Stress: No Stress Concern Present (03/22/2024)   [employer] of Occupational Health - Occupational Stress Questionnaire    Feeling of Stress : Only a little  Social Connections: Unknown (03/22/2024)   Social Connection and Isolation Panel    Frequency of Communication with Friends and Family: Not on file    Frequency of Social  Gatherings with Friends and Family: Not on file    Attends Religious Services: Not on file    Active Member of Clubs or Organizations: Not on file    Attends Club or Organization Meetings: Not on file    Marital Status: Married  Intimate Partner Violence: Not At Risk (04/16/2023)   Humiliation, Afraid, Rape, and Kick questionnaire    Fear of Current or Ex-Partner: No    Emotionally Abused: No    Physically Abused: No    Sexually Abused: No   Family History  Problem Relation Age of Onset   Dementia Mother    Heart disease Father    Diabetes Father    Other Sister        BRIP1 gene mutation   Kidney disease Brother    Colon cancer Maternal Uncle    Lung cancer Paternal Aunt        d. >50   Cancer Paternal Aunt        unknown type, d. >50   Cancer Cousin  17       gynecologic (paternal first cousin)   Colon cancer Nephew 27       arising in colon polyp   Cancer Niece 44       gynecologic; niece in her 54's   Breast cancer Niece 62   Other Niece        BRIP1 gene mutation   Cancer Paternal Great-grandmother        abdominal cancer (PGF's mother) great grandmother   No Known Allergies Scheduled Meds:  albuterol   2.5 mg Nebulization Q6H   atorvastatin   40 mg Per Tube Daily   carvedilol   6.25 mg Per Tube BID   Chlorhexidine  Gluconate Cloth  6 each Topical Daily   feeding supplement (PROSource TF20)  60 mL Per Tube Daily   fiber supplement (BANATROL TF)  60 mL Per Tube BID   free water   200 mL Per Tube Q6H   guaiFENesin   15 mL Per Tube Q6H   hydrALAZINE   50 mg Per Tube Q8H   insulin  aspart  0-9 Units Subcutaneous Q4H   levETIRAcetam   1,250 mg Per Tube BID   mouth rinse  15 mL Mouth Rinse 4 times per day   Continuous Infusions:  feeding supplement (OSMOLITE 1.5 CAL) Stopped (07/16/24 2349)   remifentanil  (ULTIVA ) 2 mg in 100 mL normal saline (20 mcg/mL) Optime     PRN Meds:.acetaminophen  (TYLENOL ) oral liquid 160 mg/5 mL, albuterol , haloperidol  lactate, hydrALAZINE , metoprolol  tartrate, mouth rinse Vitals:   07/17/24 0747 07/17/24 0800  BP:  (!) 120/55  Pulse:  99  Resp:  (!) 21  Temp:    SpO2: 92% 96%   Physical Exam HENT:     Head: Normocephalic.     Nose: Nose normal.  Eyes:     Pupils: Pupils are equal, round, and reactive to light.  Cardiovascular:     Rate and Rhythm: Normal rate.  Pulmonary:     Effort: Pulmonary effort is normal.  Abdominal:     General: Abdomen is flat.  Musculoskeletal:     Cervical back: Normal range of motion.  Neurological:     Mental Status: He is alert.   ASSESSMENT: RIGHT BRAIN TUMOR  PLAN: RIGHT STEREOTACTIC BRAIN BIOPSY POSSIBLE CRANIOTOMY FOR BIOPSY  I SPOKE TO THE FAMILY (WIFE, BROTHER AND SISTER IN LAW) AGAIN AND DISCUSSED THE REASON FOR SURGERY AS WELL AS THE RISK OF  BLEEDING AND INFECTION WITH THEM. THEY AGREED TO PROCEED.

## 2024-07-18 ENCOUNTER — Inpatient Hospital Stay (HOSPITAL_COMMUNITY)

## 2024-07-18 ENCOUNTER — Encounter (HOSPITAL_COMMUNITY): Payer: Self-pay | Admitting: Neurosurgery

## 2024-07-18 ENCOUNTER — Encounter: Admitting: Neurosurgery

## 2024-07-18 DIAGNOSIS — R569 Unspecified convulsions: Secondary | ICD-10-CM | POA: Diagnosis not present

## 2024-07-18 DIAGNOSIS — D497 Neoplasm of unspecified behavior of endocrine glands and other parts of nervous system: Secondary | ICD-10-CM | POA: Diagnosis not present

## 2024-07-18 DIAGNOSIS — G9389 Other specified disorders of brain: Secondary | ICD-10-CM | POA: Diagnosis not present

## 2024-07-18 LAB — GLUCOSE, CAPILLARY
Glucose-Capillary: 167 mg/dL — ABNORMAL HIGH (ref 70–99)
Glucose-Capillary: 184 mg/dL — ABNORMAL HIGH (ref 70–99)
Glucose-Capillary: 185 mg/dL — ABNORMAL HIGH (ref 70–99)
Glucose-Capillary: 201 mg/dL — ABNORMAL HIGH (ref 70–99)
Glucose-Capillary: 207 mg/dL — ABNORMAL HIGH (ref 70–99)
Glucose-Capillary: 244 mg/dL — ABNORMAL HIGH (ref 70–99)

## 2024-07-18 LAB — CBC
HCT: 44.2 % (ref 39.0–52.0)
Hemoglobin: 15 g/dL (ref 13.0–17.0)
MCH: 30.8 pg (ref 26.0–34.0)
MCHC: 33.9 g/dL (ref 30.0–36.0)
MCV: 90.8 fL (ref 80.0–100.0)
Platelets: 371 K/uL (ref 150–400)
RBC: 4.87 MIL/uL (ref 4.22–5.81)
RDW: 12.3 % (ref 11.5–15.5)
WBC: 18.7 K/uL — ABNORMAL HIGH (ref 4.0–10.5)
nRBC: 0 % (ref 0.0–0.2)

## 2024-07-18 LAB — BASIC METABOLIC PANEL WITH GFR
Anion gap: 12 (ref 5–15)
BUN: 31 mg/dL — ABNORMAL HIGH (ref 8–23)
CO2: 21 mmol/L — ABNORMAL LOW (ref 22–32)
Calcium: 8.9 mg/dL (ref 8.9–10.3)
Chloride: 104 mmol/L (ref 98–111)
Creatinine, Ser: 0.81 mg/dL (ref 0.61–1.24)
GFR, Estimated: >60 mL/min (ref >60)
Glucose, Bld: 230 mg/dL — ABNORMAL HIGH (ref 70–99)
Potassium: 4.3 mmol/L (ref 3.5–5.1)
Sodium: 137 mmol/L (ref 135–145)

## 2024-07-18 MED ORDER — INSULIN ASPART 100 UNIT/ML IJ SOLN
0.0000 [IU] | INTRAMUSCULAR | Status: DC
  Administered 2024-07-18: 3 [IU] via SUBCUTANEOUS
  Administered 2024-07-18 (×2): 5 [IU] via SUBCUTANEOUS
  Administered 2024-07-18 – 2024-07-19 (×2): 3 [IU] via SUBCUTANEOUS
  Administered 2024-07-19: 2 [IU] via SUBCUTANEOUS
  Administered 2024-07-19: 3 [IU] via SUBCUTANEOUS
  Administered 2024-07-19: 5 [IU] via SUBCUTANEOUS
  Administered 2024-07-19 – 2024-07-20 (×2): 3 [IU] via SUBCUTANEOUS
  Administered 2024-07-20: 5 [IU] via SUBCUTANEOUS
  Administered 2024-07-20: 2 [IU] via SUBCUTANEOUS
  Administered 2024-07-20 – 2024-07-21 (×4): 3 [IU] via SUBCUTANEOUS
  Administered 2024-07-21: 5 [IU] via SUBCUTANEOUS
  Administered 2024-07-21: 3 [IU] via SUBCUTANEOUS
  Administered 2024-07-21 (×2): 5 [IU] via SUBCUTANEOUS
  Administered 2024-07-21: 8 [IU] via SUBCUTANEOUS
  Administered 2024-07-21 – 2024-07-22 (×2): 5 [IU] via SUBCUTANEOUS
  Administered 2024-07-22: 8 [IU] via SUBCUTANEOUS
  Administered 2024-07-22 – 2024-07-23 (×6): 3 [IU] via SUBCUTANEOUS
  Administered 2024-07-23: 5 [IU] via SUBCUTANEOUS
  Administered 2024-07-23 (×2): 3 [IU] via SUBCUTANEOUS
  Administered 2024-07-23: 8 [IU] via SUBCUTANEOUS
  Administered 2024-07-24 (×2): 5 [IU] via SUBCUTANEOUS
  Administered 2024-07-24: 8 [IU] via SUBCUTANEOUS
  Administered 2024-07-24: 5 [IU] via SUBCUTANEOUS
  Administered 2024-07-25: 3 [IU] via SUBCUTANEOUS
  Administered 2024-07-25 (×4): 5 [IU] via SUBCUTANEOUS
  Administered 2024-07-25: 3 [IU] via SUBCUTANEOUS
  Administered 2024-07-26: 5 [IU] via SUBCUTANEOUS
  Administered 2024-07-26: 3 [IU] via SUBCUTANEOUS
  Administered 2024-07-26 (×3): 5 [IU] via SUBCUTANEOUS
  Administered 2024-07-26 (×2): 2 [IU] via SUBCUTANEOUS
  Administered 2024-07-27 (×2): 3 [IU] via SUBCUTANEOUS
  Administered 2024-07-27: 2 [IU] via SUBCUTANEOUS
  Administered 2024-07-27: 3 [IU] via SUBCUTANEOUS
  Administered 2024-07-27 – 2024-07-28 (×3): 5 [IU] via SUBCUTANEOUS
  Administered 2024-07-28: 3 [IU] via SUBCUTANEOUS
  Administered 2024-07-28: 8 [IU] via SUBCUTANEOUS
  Administered 2024-07-28 (×2): 5 [IU] via SUBCUTANEOUS
  Administered 2024-07-29: 3 [IU] via SUBCUTANEOUS
  Administered 2024-07-29: 5 [IU] via SUBCUTANEOUS
  Administered 2024-07-29: 3 [IU] via SUBCUTANEOUS
  Administered 2024-07-29: 5 [IU] via SUBCUTANEOUS
  Administered 2024-07-29: 3 [IU] via SUBCUTANEOUS
  Administered 2024-07-30 (×2): 5 [IU] via SUBCUTANEOUS
  Administered 2024-07-30 – 2024-07-31 (×3): 3 [IU] via SUBCUTANEOUS
  Administered 2024-07-31: 2 [IU] via SUBCUTANEOUS
  Administered 2024-07-31: 3 [IU] via SUBCUTANEOUS
  Administered 2024-07-31: 2 [IU] via SUBCUTANEOUS
  Administered 2024-08-01: 3 [IU] via SUBCUTANEOUS

## 2024-07-18 MED ORDER — FREE WATER
200.0000 mL | Freq: Four times a day (QID) | Status: DC
  Administered 2024-07-18 – 2024-07-20 (×9): 200 mL

## 2024-07-18 MED ORDER — FREE WATER
200.0000 mL | Status: DC
  Administered 2024-07-18: 200 mL

## 2024-07-18 MED ORDER — INSULIN GLARGINE 100 UNIT/ML ~~LOC~~ SOLN
5.0000 [IU] | Freq: Every day | SUBCUTANEOUS | Status: DC
  Administered 2024-07-18 – 2024-07-20 (×3): 5 [IU] via SUBCUTANEOUS
  Filled 2024-07-18 (×5): qty 0.05

## 2024-07-18 MED ORDER — BETHANECHOL CHLORIDE 10 MG PO TABS
5.0000 mg | ORAL_TABLET | Freq: Three times a day (TID) | ORAL | Status: DC
Start: 2024-07-18 — End: 2024-07-20
  Administered 2024-07-18 – 2024-07-20 (×6): 5 mg via ORAL
  Filled 2024-07-18 (×6): qty 1

## 2024-07-18 MED ORDER — DIAZEPAM 5 MG/ML IJ SOLN
10.0000 mg | Freq: Once | INTRAMUSCULAR | Status: AC
  Administered 2024-07-18: 10 mg via INTRAVENOUS
  Filled 2024-07-18: qty 2

## 2024-07-18 NOTE — Plan of Care (Signed)
  Problem: Respiratory: Goal: Ability to maintain a clear airway and adequate ventilation will improve Outcome: Progressing   Problem: Role Relationship: Goal: Method of communication will improve Outcome: Progressing   Problem: Education: Goal: Knowledge of General Education information will improve Description: Including pain rating scale, medication(s)/side effects and non-pharmacologic comfort measures Outcome: Progressing   Problem: Elimination: Goal: Will not experience complications related to bowel motility Outcome: Progressing Goal: Will not experience complications related to urinary retention Outcome: Progressing

## 2024-07-18 NOTE — Progress Notes (Signed)
   Inpatient Rehab Admissions Coordinator :  Per therapy recommendations patient was screened for CIR candidacy by Ottie Glazier RN MSN. Patient is not yet at a level to tolerate the intensity required to pursue a CIR admit . The CIR admissions team will follow and monitor for progress and place a Rehab Consult order if felt to be appropriate. Please contact me with any questions.  Ottie Glazier RN MSN Admissions Coordinator [phone]

## 2024-07-18 NOTE — Progress Notes (Signed)
 Physical Therapy Treatment Patient Details Name: Wayne Molina MRN: [id number] DOB: February 06, 1956 Today's Date: 07/18/2024   History of Present Illness Pt is a 68 y/o male presenting on 8/28 with L arm weakness, L hemianopsia, and slurred speech. 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 Course complicated by seizure episode. Intubated 8/29, extubated 9/4 but still with difficulty managing secretions.  Biopsy on 9/8. PMH includes: anxiety, HLD, HTN, sleep apnea, chronic afib.    PT Comments  Pt tolerates treatment well, progressing to out of bed mobility. Pt continues to require significant physical assistance for all standing and transfers, with limited hip extensor activation noted. Pt will benefit from continued frequent mobilization in an effort to reduce falls risk and to restore independence in mobility. Patient will benefit from intensive inpatient follow-up therapy, >3 hours/day.    If plan is discharge home, recommend the following: Two people to help with walking and/or transfers;Two people to help with bathing/dressing/bathroom;Assistance with cooking/housework;Assistance with feeding;Direct supervision/assist for medications management;Assist for transportation;Direct supervision/assist for financial management;Supervision due to cognitive status   Can travel by Doctor, hospital (measurements PT);Wheelchair cushion (measurements PT);Hospital bed;Hoyer lift    Recommendations for Other Services       Precautions / Restrictions Precautions Precautions: Fall;Other (comment) Recall of Precautions/Restrictions: Impaired Precaution/Restrictions Comments: cortrak, watch vitals; flexiseal, SBP <160 Restrictions Weight Bearing Restrictions Per Provider Order: No     Mobility  Bed Mobility Overal bed mobility: Needs Assistance Bed Mobility: Sidelying to  Sit, Rolling Rolling: Mod assist Sidelying to sit: Mod assist, HOB elevated, Used rails, +2 for physical assistance            Transfers Overall transfer level: Needs assistance Equipment used: 1 person hand held assist Transfers: Bed to chair/wheelchair/BSC, Sit to/from Stand Sit to Stand: Total assist, +2 physical assistance     Squat pivot transfers: Total assist, +2 safety/equipment          Ambulation/Gait                   Stairs             Wheelchair Mobility     Tilt Bed    Modified Rankin (Stroke Patients Only) Modified Rankin (Stroke Patients Only) Pre-Morbid Rankin Score: No symptoms Modified Rankin: Severe disability     Balance Overall balance assessment: Needs assistance Sitting-balance support: Single extremity supported, Feet supported Sitting balance-Leahy Scale: Poor Sitting balance - Comments: CGA   Standing balance support: Bilateral upper extremity supported Standing balance-Leahy Scale: Zero                              Communication Communication Communication: Impaired Factors Affecting Communication: Reduced clarity of speech (loquacious)  Cognition Arousal: Alert Behavior During Therapy: WFL for tasks assessed/performed   PT - Cognitive impairments: Awareness, Memory, Attention, Orientation, Initiation, Sequencing, Problem solving, Safety/Judgement   Orientation impairments: Time                     Following commands: Impaired Following commands impaired: Follows one step commands with increased time, Follows multi-step commands inconsistently    Cueing Cueing Techniques: Verbal cues, Tactile cues, Visual cues  Exercises      General Comments General comments (skin integrity, edema, etc.): pt on 2L [place] upon PT arrival,  weaned to room air with stable sats in low 90s.      Pertinent Vitals/Pain Pain Assessment Pain Assessment: Faces Faces Pain Scale: Hurts little more Pain Location:  buttock Pain Descriptors / Indicators: Sore Pain Intervention(s): Monitored during session    Home Living                          Prior Function            PT Goals (current goals can now be found in the care plan section) Acute Rehab PT Goals Patient Stated Goal: to return to work Progress towards PT goals: Progressing toward goals    Frequency    Min 3X/week      PT Plan      Co-evaluation PT/OT/SLP Co-Evaluation/Treatment: Yes Reason for Co-Treatment: Complexity of the patient's impairments (multi-system involvement);Necessary to address cognition/behavior during functional activity;For patient/therapist safety;To address functional/ADL transfers PT goals addressed during session: Mobility/safety with mobility;Balance;Strengthening/ROM        AM-PAC PT 6 Clicks Mobility   Outcome Measure  Help needed turning from your back to your side while in a flat bed without using bedrails?: A Lot Help needed moving from lying on your back to sitting on the side of a flat bed without using bedrails?: A Lot Help needed moving to and from a bed to a chair (including a wheelchair)?: Total Help needed standing up from a chair using your arms (e.g., wheelchair or bedside chair)?: Total Help needed to walk in hospital room?: Total Help needed climbing 3-5 steps with a railing? : Total 6 Click Score: 8    End of Session Equipment Utilized During Treatment: Gait belt Activity Tolerance: Patient tolerated treatment well Patient left: in chair;with call bell/phone within reach;with chair alarm set Nurse Communication: Mobility status PT Visit Diagnosis: Unsteadiness on feet (R26.81);Other abnormalities of gait and mobility (R26.89);Muscle weakness (generalized) (M62.81);Difficulty in walking, not elsewhere classified (R26.2);Other symptoms and signs involving the nervous system (R29.898)     Time: 8967-8942 PT Time Calculation (min) (ACUTE ONLY): 25 min  Charges:     $Therapeutic Activity: 8-22 mins PT General Charges $$ ACUTE PT VISIT: 1 Visit                     Bernardino JINNY Ruth, PT, DPT Acute Rehabilitation Office [phone]    Bernardino JINNY Ruth 07/18/2024, 11:30 AM

## 2024-07-18 NOTE — Plan of Care (Signed)
  Problem: Activity: Goal: Ability to tolerate increased activity will improve Outcome: Not Progressing   Problem: Respiratory: Goal: Ability to maintain a clear airway and adequate ventilation will improve Outcome: Not Progressing   Problem: Role Relationship: Goal: Method of communication will improve Outcome: Not Progressing   Problem: Education: Goal: Knowledge of General Education information will improve Description: Including pain rating scale, medication(s)/side effects and non-pharmacologic comfort measures Outcome: Not Progressing   Problem: Health Behavior/Discharge Planning: Goal: Ability to manage health-related needs will improve Outcome: Not Progressing   Problem: Clinical Measurements: Goal: Ability to maintain clinical measurements within normal limits will improve Outcome: Not Progressing Goal: Will remain free from infection Outcome: Not Progressing Goal: Diagnostic test results will improve Outcome: Not Progressing Goal: Respiratory complications will improve Outcome: Not Progressing Goal: Cardiovascular complication will be avoided Outcome: Not Progressing   Problem: Activity: Goal: Risk for activity intolerance will decrease Outcome: Not Progressing   Problem: Nutrition: Goal: Adequate nutrition will be maintained Outcome: Not Progressing   Problem: Coping: Goal: Level of anxiety will decrease Outcome: Not Progressing   Problem: Elimination: Goal: Will not experience complications related to bowel motility Outcome: Not Progressing Goal: Will not experience complications related to urinary retention Outcome: Not Progressing   Problem: Pain Managment: Goal: General experience of comfort will improve and/or be controlled Outcome: Not Progressing   Problem: Safety: Goal: Ability to remain free from injury will improve Outcome: Not Progressing   Problem: Skin Integrity: Goal: Risk for impaired skin integrity will decrease Outcome: Not  Progressing   Problem: Education: Goal: Ability to describe self-care measures that may prevent or decrease complications (Diabetes Survival Skills Education) will improve Outcome: Not Progressing Goal: Individualized Educational Video(s) Outcome: Not Progressing   Problem: Coping: Goal: Ability to adjust to condition or change in health will improve Outcome: Not Progressing   Problem: Fluid Volume: Goal: Ability to maintain a balanced intake and output will improve Outcome: Not Progressing   Problem: Health Behavior/Discharge Planning: Goal: Ability to identify and utilize available resources and services will improve Outcome: Not Progressing Goal: Ability to manage health-related needs will improve Outcome: Not Progressing   Problem: Metabolic: Goal: Ability to maintain appropriate glucose levels will improve Outcome: Not Progressing   Problem: Nutritional: Goal: Maintenance of adequate nutrition will improve Outcome: Not Progressing Goal: Progress toward achieving an optimal weight will improve Outcome: Not Progressing   Problem: Skin Integrity: Goal: Risk for impaired skin integrity will decrease Outcome: Not Progressing   Problem: Tissue Perfusion: Goal: Adequacy of tissue perfusion will improve Outcome: Not Progressing   Problem: Education: Goal: Knowledge of the prescribed therapeutic regimen will improve Outcome: Not Progressing   Problem: Clinical Measurements: Goal: Usual level of consciousness will be regained or maintained. Outcome: Not Progressing Goal: Neurologic status will improve Outcome: Not Progressing Goal: Ability to maintain intracranial pressure will improve Outcome: Not Progressing   Problem: Skin Integrity: Goal: Demonstration of wound healing without infection will improve Outcome: Not Progressing

## 2024-07-18 NOTE — Progress Notes (Signed)
 NAME:  Wayne Molina, MRN:  [id number], DOB:  11-28-1955, LOS: 11 ADMISSION DATE:  07/06/2024, CONSULTATION DATE:  07/07/2024 REFERRING MD:  SHIRLEYANN, CHIEF COMPLAINT:  AMS   History of Present Illness:  68 year old man who presented to [facility] 8/28 with AMS, Code Stroke. PMHx significant for HTN, HLD, Afib on Eliquis , untreated OSA.  Patient presented to ER as a Code Stroke after coworker noticed he was altered since 8/28 around 2230 and pale, patient reported feeling dizzy.  Wife reports pt has been in his normal state of health, just recently not sleeping well for the last week and fatigued.  Nonsmoker, wife denies ETOH use. 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  In ER, noted to have L sided weakness and left hemianopsia.  Last dose Eliquis  reported 8/28.  Code Stroke activated.  CT Head initially concerning for right posterior MCA stroke but CTA negative for LVO.  Also noted to have rhythmic eye twitching, SBP 200s, and more lethargic, oriented to person only.  Repeat CT Head concerning for mass. EEG started and Keppra  load given. FPTS admitted.  Patient was given a total of 4mg  Ativan  for MRI, seizure concern, and intermittent agitation.  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 NSGY was consulted with plan for eventual biopsy.    PCCM consulted for ongoing encephalopathy and concern for airway protection. Intubated 8/29.  Pertinent Medical History:   Past Medical History:  Diagnosis Date   Anxiety    Arthritis    fingers   Chronic atrial fibrillation (HCC)    Depression    Family history of breast cancer    Family history of cancer of male genital organ    Family history of gene mutation    BRIP1   Family history of malignant neoplasm of  gastrointestinal tract    Hyperlipidemia    Hypertension    Personal history of colonic polyps 11/12/2006   Sleep apnea    Significant Hospital Events: Including procedures, antibiotic start and stop dates in addition to other pertinent events   8/28 Admitted seizures. LTM initiated .  8/29 PCCM consulted. Intubated for AW protection. CT Head with enhancing mass (bilobed), with dominant components measuring 2.9 cm and 3.7 cm at the right temporal lobe and splenium respectively. Surrounding vasogenic edema without midline shift. MRI brain Positive for relatively large and infiltrative tumor with heterogeneous enhancement tracking from the splenium of the corpus callosum near midline into the right temporal lobe. Confluent regional tumoral edema. 8/30 Evidence of epileptogenicity and cortical dysfunction arising from right hemisphere likely due to underlying structural abnormality. Seen by neuro surg w/ plan for Bx after off vent and stable 8/31 1-2 Different episodes of jerking concerning for seizures.  None seen on EEG.  Midazolam  titration down.  Added Zosyn  for fever 9/1 Stopping Versed .  Weaning some. Sputum culture growing staph started on vancomycin  until sensitivities back 9/2 Resp Cx with abundant staph aureus, abundant H. Flu. Abx transitioned to Unasyn . 9/3 Some issues with hypertension overnight, remains on vent with fent and precedex  this Precedex  was stopped given concern for fever. 9/4 antibiotics changed to Ancef  to cover both staph and H. influenzae with plan to complete total of 7 days.  Off precedex . Extubated> HHFNC, 3% nebs, lasix , CPT, NT suctioning 9/9: down to [facility], biopsy completed and drain  removed today. Will transfer to SDU.   Interim History/Subjective:  Drain out today. Transfer to SDU and to [facility] tomorrow. Oxygen down to [facility] and CXR yesterday with cleared pneumonia. He feels okay without pain. Will need pt/ot/slp therapies. 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   Objective:   Blood pressure 134/83, pulse (!) 107, temperature 98.3 F (36.8 C), temperature source Oral, resp. rate (!) 22, height 5' 9 (1.753 m), weight 73.9 kg, SpO2 92%.    FiO2 (%):  [40 %] 40 %   Intake/Output Summary (Last 24 hours) at 07/18/2024 0722 Last data filed at 07/18/2024 0525 Gross per 24 hour  Intake 1350 ml  Output 1945 ml  Net -595 ml   Filed Weights   07/16/24 0313 07/17/24 0500 07/18/24 0500  Weight: 73.6 kg 75 kg 73.9 kg   Physical Examination: General: older male, laying in bed, acute on chronically ill appearing Neuro: awake and alert. He is oriented to self, year, and hospital. Denies pain. Follows commands. L>R weakness. At times talks incomprehensibly  Cardiac: irregularly irregular rhythm, no appreciable murmur, no LE edema  Lungs: clear bilaterally, 2L [place], resp even and unlabored  Abdomen: soft, ntnd, tube feed  Extremities: warm, no edema  Resolved Problem List:  Status epilepticus resolved as of 8/31 Abnormal LFTs - normalized 9/2 Fluid and electrolyte imbalance & acid base imbalance  hypophosphatemia, NAGMA -Bicarb gtt from 23 to 18 on 8/31. Not hyperchloremic.  Assessment and Plan:   Brain mass with new onset seizures: Agitation/delirium: Differential for brain mass is lymphoma versus GBM. LTM not showing any more seizures.  LTM discontinued. Biopsy performed 9/8 with Dr. Janjua. 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 Pending formal path - con't keppra  BID  - f/u path report for formal diagnosis to facilitate appropriate follow-up - pain control with tylenol , fioricet , baclofen , fentanyl   - BP control with hydralazine  scheduled and prn, labetalol  prn  - nausea control with zofran  and phenergan    Acute hypoxic respiratory failure 2/2 MSSA and H. Flu pneumonia  MSSA PNA Hflu PNA CXR with LLL pneumonia. Sputum cultures with pan sensitive MSSA and H.flu. completed 7 day course of cefazolin  on 9/7.  - O2 for sat >92% - now on [facility] -  fever curve improving  - flutter valve, IS, NTS, chest PT to mobilize secretions   A-fib on Eliquis : - heparin  gtt held at midnight 9/8 for biopsy  - prn metoprolol   - hold heparin  for 7 days post-procedure (9/15) - bridge with aspirin  starting 48hrs post-biopsy if okay with neurosurgery (9/10 mid day)  HTN: HLD: - ideally SBP < 160 - hydralazine  50mg  q8h, prn hydral/labetalol / - cont lipitor   Hyperglycemia: - A1c 6.0 - increase ssi to moderate (steroid induced) - add lantus  5mg  daily   At risk for malnutrition:   - cont TF.  - Free water  200 every 6 added.  Patient's wife at bedside is updated every day of patient's condition. Knows we are waiting on biopsy but high concern that this is malignancy. She is aware we will not have biopsy result same day of procedure.   Transfer to SDU, TRH pick up 9/10.    CCM: 74m Tinnie FORBES Furth, PA-C [facility] 07/18/24 7:22 AM  Please see [email or web address] for pager details.  From 7A-7P if no response, please call [phone] After hours, please call ELink [phone]

## 2024-07-18 NOTE — Progress Notes (Signed)
 Occupational Therapy Treatment Patient Details Name: Wayne Molina MRN: [id number] DOB: [birth year] Today's Date: 07/18/2024   History of present illness Pt is a 68 y/o male presenting on 8/28 with L arm weakness, L hemianopsia, and slurred speech. 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 Course complicated by seizure episode. Intubated 8/29, extubated 9/4 but still with difficulty managing secretions.  Biopsy on 9/8. PMH includes: anxiety, HLD, HTN, sleep apnea, chronic afib.   OT comments  Pt progressing toward goals, able to perform bed mobility with mod A, and CGA-minA for sitting balance, pt noted to have posterior and mild R lateral lean/drift in sitting. Pt tangential in conversation, needs mod cues to redirect and follow 1 step command during session. Pt min-mod A for seated ADLs, and total +2 for stand pivot transfer to chair on pt's R side. Pt satting 92% on RA, RN notified O2 left off. Pt presenting with impairments listed below, will follow acutely. Patient will benefit from intensive inpatient follow-up therapy, >3 hours/day to maximize safety/ind with ADL/functional mobility.       If plan is discharge home, recommend the following:  Two people to help with walking and/or transfers;A lot of help with bathing/dressing/bathroom;Assistance with cooking/housework;Direct supervision/assist for medications management;Direct supervision/assist for financial management;Assist for transportation;Help with stairs or ramp for entrance   Equipment Recommendations  Other (comment) (defer)    Recommendations for Other Services PT consult;Speech consult;Rehab consult    Precautions / Restrictions Precautions Precautions: Fall;Other (comment) Recall of Precautions/Restrictions: Impaired Precaution/Restrictions Comments: cortrak, watch vitals; flexiseal, SBP <160 Restrictions Weight Bearing Restrictions Per  Provider Order: No       Mobility Bed Mobility Overal bed mobility: Needs Assistance Bed Mobility: Sidelying to Sit, Rolling Rolling: Mod assist Sidelying to sit: Mod assist, HOB elevated, Used rails, +2 for physical assistance            Transfers Overall transfer level: Needs assistance Equipment used: 1 person hand held assist Transfers: Bed to chair/wheelchair/BSC, Sit to/from Stand Sit to Stand: Total assist, +2 physical assistance   Squat pivot transfers: Total assist, +2 safety/equipment             Balance Overall balance assessment: Needs assistance Sitting-balance support: Single extremity supported, Feet supported Sitting balance-Leahy Scale: Poor Sitting balance - Comments: CGA Postural control: Posterior lean, Right lateral lean Standing balance support: Bilateral upper extremity supported Standing balance-Leahy Scale: Zero                             ADL either performed or assessed with clinical judgement   ADL Overall ADL's : Needs assistance/impaired Eating/Feeding: NPO   Grooming: Minimal assistance;Wash/dry face;Sitting   Upper Body Bathing: Moderate assistance;Sitting       Upper Body Dressing : Moderate assistance;Sitting                   Functional mobility during ADLs: Maximal assistance      Extremity/Trunk Assessment Upper Extremity Assessment Upper Extremity Assessment: Left hand dominant LUE Deficits / Details: 3-/5 globally LUE Coordination: decreased fine motor;decreased gross motor   Lower Extremity Assessment Lower Extremity Assessment: Generalized weakness        Vision   Vision Assessment?: Vision impaired- to be further tested in functional context Additional Comments: eyes open noted to have preference for R head turn, needs further assessment   Perception Perception  Perception: Not tested   Praxis Praxis Praxis: Not tested   Communication Communication Communication: Impaired Factors  Affecting Communication: Reduced clarity of speech   Cognition Arousal: Alert Behavior During Therapy: WFL for tasks assessed/performed Cognition: Cognition impaired   Orientation impairments: Situation Awareness: Online awareness impaired   Attention impairment (select first level of impairment): Sustained attention   OT - Cognition Comments: verbose, tangential, needs mod cues to attend to task/follow single step instructions throughout session                 Following commands: Impaired Following commands impaired: Follows one step commands with increased time, Follows multi-step commands inconsistently      Cueing   Cueing Techniques: Verbal cues, Tactile cues, Visual cues  Exercises      Shoulder Instructions       General Comments satting 92% on RA at end of session    Pertinent Vitals/ Pain       Pain Assessment Pain Assessment: Faces Pain Score: 4  Faces Pain Scale: Hurts little more Pain Location: buttock Pain Descriptors / Indicators: Sore Pain Intervention(s): Limited activity within patient's tolerance, Monitored during session, Repositioned  Home Living                                          Prior Functioning/Environment              Frequency  Min 2X/week        Progress Toward Goals  OT Goals(current goals can now be found in the care plan section)  Progress towards OT goals: Progressing toward goals  Acute Rehab OT Goals Patient Stated Goal: did not state OT Goal Formulation: With patient Time For Goal Achievement: 07/30/24 Potential to Achieve Goals: Good ADL Goals Pt Will Perform Grooming: with min assist;sitting Pt Will Transfer to Toilet: with mod assist;squat pivot transfer;stand pivot transfer;bedside commode Pt/caregiver will Perform Home Exercise Program: Increased strength;Increased ROM;Left upper extremity;With minimal assist Additional ADL Goal #1: pt will visually track and locate items on L  side with min cues in order to promote ind with ADLS Additional ADL Goal #2: pt will perform bed mobility min A in prep for seated ADLs  Plan      Co-evaluation    PT/OT/SLP Co-Evaluation/Treatment: Yes Reason for Co-Treatment: Complexity of the patient's impairments (multi-system involvement);Necessary to address cognition/behavior during functional activity;For patient/therapist safety;To address functional/ADL transfers PT goals addressed during session: Mobility/safety with mobility;Balance;Strengthening/ROM OT goals addressed during session: ADL's and self-care      AM-PAC OT 6 Clicks Daily Activity     Outcome Measure   Help from another person eating meals?: A Lot Help from another person taking care of personal grooming?: A Lot Help from another person toileting, which includes using toliet, bedpan, or urinal?: A Lot Help from another person bathing (including washing, rinsing, drying)?: A Lot Help from another person to put on and taking off regular upper body clothing?: A Lot Help from another person to put on and taking off regular lower body clothing?: A Lot 6 Click Score: 12    End of Session Equipment Utilized During Treatment: Gait belt  OT Visit Diagnosis: Unsteadiness on feet (R26.81);Other abnormalities of gait and mobility (R26.89);Muscle weakness (generalized) (M62.81)   Activity Tolerance Patient tolerated treatment well   Patient Left in chair;with call bell/phone within reach;with chair alarm set;with family/visitor present   Nurse  Communication Mobility status        Time: 8968-8896 OT Time Calculation (min): 32 min  Charges: OT General Charges $OT Visit: 1 Visit OT Treatments $Self Care/Home Management : 8-22 mins  Wayne Molina, OTD, OTR/L SecureChat Preferred Acute Rehab [phone]   Laneta MARLA Pereyra 07/18/2024, 12:39 PM

## 2024-07-18 NOTE — TOC Progression Note (Signed)
 Transition of Care [facility]) - Progression Note    Patient Details  Name: Wayne Molina MRN: [id number] Date of Birth: 08-17-1956  [facility] of [facility]) CM/SW Contact  Inocente GORMAN Kindle, LCSW Phone Number: 07/18/2024, 8:39 AM  Clinical Narrative:    CSW continuing to follow for needs.      Barriers to Discharge: Continued Medical Work up               Expected Discharge Plan and Services       Living arrangements for the past 2 months: Single Family Home                                       Social Drivers of Health (SDOH) Interventions SDOH Screenings   Food Insecurity: No Food Insecurity (03/22/2024)  Housing: Low Risk  (03/22/2024)  Transportation Needs: No Transportation Needs (03/22/2024)  Utilities: Not At Risk (04/16/2023)  Alcohol Screen: Low Risk  (04/16/2023)  Depression (PHQ2-9): Low Risk  (03/23/2024)  Financial Resource Strain: Low Risk  (03/22/2024)  Physical Activity: Sufficiently Active (03/22/2024)  Social Connections: Unknown (03/22/2024)  Stress: No Stress Concern Present (03/22/2024)  Tobacco Use: Medium Risk (07/07/2024)    Readmission Risk Interventions     No data to display

## 2024-07-18 NOTE — Evaluation (Signed)
 Clinical/Bedside Swallow Evaluation Patient Details  Name: Wayne Molina MRN: [id number] Date of Birth: 1956/03/25  Today's Date: 07/18/2024 Time: SLP Start Time (ACUTE ONLY): [phone] SLP Stop Time (ACUTE ONLY): 1031 SLP Time Calculation (min) (ACUTE ONLY): 43 min  Past Medical History:  Past Medical History:  Diagnosis Date   Anxiety    Arthritis    fingers   Chronic atrial fibrillation (HCC)    Depression    Family history of breast cancer    Family history of cancer of male genital organ    Family history of gene mutation    BRIP1   Family history of malignant neoplasm of gastrointestinal tract    Hyperlipidemia    Hypertension    Personal history of colonic polyps 11/12/2006   Sleep apnea    Past Surgical History:  Past Surgical History:  Procedure Laterality Date   2-D echocardiogram  07/22/2010   Ejection fraction greater than 55%. Left atrium moderately dilated. Right atrium moderately dilated. 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 Mild to Moderate MR. Mild to moderate TR.   APPLICATION OF CRANIAL NAVIGATION Right 07/17/2024   Procedure: COMPUTER-ASSISTED NAVIGATION, FOR CRANIAL PROCEDURE;  Surgeon: Rosslyn Dino HERO, MD;  Location: [facility] OR;  Service: Neurosurgery;  Laterality: Right;  CRANIAL NAVIGATION   ATRIAL ABLATION SURGERY  2004   EP IMPLANTABLE DEVICE N/A 02/11/2016   Procedure: Loop Recorder Insertion;  Surgeon: Jerel Balding, MD;  Location: [facility] INVASIVE CV LAB;  Service: Cardiovascular;  Laterality: N/A;   Exercise Myoview stress test  07/08/2000   Nonischemic low-risk.   HIP FRACTURE SURGERY  1970s   IR GENERIC HISTORICAL  01/21/2017   IR RADIOLOGIST EVAL & MGMT 01/21/2017 [facility]-INTERV RAD   STERIOTACTIC STIMULATOR INSERTION Right 07/17/2024   Procedure: OPEN CRANIOTOMY FOR BIOPSY;  Surgeon: Rosslyn Dino HERO, MD;  Location: [facility] OR;  Service: Neurosurgery;  Laterality: Right;  RIGHT STEREOTACTIC BRAIN BIOPSY   HPI:  Patient is a 68 y.o. man who presented to [facility] 8/28, with AMS, Code  Stroke. PMHx significant for HTN, HLD, Afib on Eliquis , untreated OSA. Patient presented to ER as a Code Stroke after coworker noticed he was altered since 8/28 around 2230 and pale, patient reported feeling dizzy. In ER, noted to have L sided weakness and left hemianopsia. CT Head initially concerning for right posterior MCA stroke but CTA negative for LVO. Repeat CT Head concerning for mass. Course complicated by seizure episode. Intubated 8/29, extubated 9/4 but still with difficulty managing secreations. 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 Patient was intubated for open craniotomy for biopsy (right head) on 09/08 and extubated in OR. SLP ordered to assess swallow function.    Assessment / Plan / Recommendation  Clinical Impression  Patient presents with clinical s/s of dysphagia as per this BSE. He was awake, alert, and participated fully in this evaluation. Patient wears top dentures and wife helped patient put in dentures before advancing to try graham crackers. SLP assessed patient's swallow via PO's of: ice chips, thin liquids, puree solids, and mechanical soft food. Patient exhibited an occassional cough when chewing food, presumed to be from talking simulataneoulsy. Swallow initiation was timely. SLP is recommending to initiate a PO diet of Dys 2 (minced) solids, thin liquids and will follow for toleration. SLP Visit Diagnosis: Dysphagia, unspecified (R13.10);Attention and concentration deficit    Aspiration Risk  Moderate aspiration risk    Diet Recommendation Dysphagia 2 (Fine  chop);Thin liquid    Liquid Administration via: Cup;Straw Medication Administration: Other (Comment) (Whole meds with liquid or with puree. As tolerated.) Supervision: Full supervision/cueing for compensatory strategies Compensations: Minimize environmental distractions;Slow rate;Small  sips/bites Postural Changes: Seated upright at 90 degrees;Remain upright for at least 30 minutes after po intake    Other  Recommendations Oral Care Recommendations: Oral care BID     Assistance Recommended at Discharge    Functional Status Assessment Patient has had a recent decline in their functional status and demonstrates the ability to make significant improvements in function in a reasonable and predictable amount of time.  Frequency and Duration min 2x/week  1 week       Prognosis Prognosis for improved oropharyngeal function: Good Barriers to Reach Goals: Cognitive deficits      Swallow Study   General Date of Onset: 07/18/24 HPI: Patient is a 68 y.o. man who presented to [facility] 8/28, with AMS, Code Stroke. PMHx significant for HTN, HLD, Afib on Eliquis , untreated OSA. Patient presented to ER as a Code Stroke after coworker noticed he was altered since 8/28 around 2230 and pale, patient reported feeling dizzy. In ER, noted to have L sided weakness and left hemianopsia. CT Head initially concerning for right posterior MCA stroke but CTA negative for LVO. Repeat CT Head concerning for mass. Course complicated by seizure episode. Intubated 8/29, extubated 9/4 but still with difficulty managing secreations. 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 Patient was intubated for open craniotomy for biopsy (right head) on 09/08 and extubated in OR. SLP ordered to assess swallow function. Type of Study: Bedside Swallow Evaluation Diet Prior to this Study: NPO Temperature Spikes Noted: No Respiratory Status: Nasal cannula History of Recent Intubation: Yes Total duration of intubation (days): 8 days Date extubated: 07/17/24 (Intubated 8/29 to 9/4. Intubated 9/8 for surgery only.) Behavior/Cognition: Alert;Cooperative;Pleasant mood Oral Cavity Assessment: Within Functional  Limits Oral Care Completed by SLP: No Oral Cavity - Dentition: Dentures, top Vision: Functional for self-feeding Self-Feeding Abilities: Needs assist Patient Positioning: Upright in bed Volitional Cough: Strong Volitional Swallow: Able to elicit    Oral/Motor/Sensory Function Overall Oral Motor/Sensory Function: Within functional limits   Ice Chips Ice chips: Within functional limits Presentation: Spoon   Thin Liquid Thin Liquid: Within functional limits Presentation: Straw    Nectar Thick     Honey Thick     Puree Puree: Within functional limits Presentation: Spoon;Self Fed   Solid     Solid: Within functional limits Presentation: Self Fed Other Comments: Patient coughed on the graham cracker as he was in the middle of talking and chewing simultaneously.      Yalena Colon  Graduate SLP Clinican

## 2024-07-18 NOTE — Anesthesia Postprocedure Evaluation (Signed)
 Anesthesia Post Note  Patient: Wayne Molina  Procedure(s) Performed: OPEN CRANIOTOMY FOR BIOPSY (Right: Head) COMPUTER-ASSISTED NAVIGATION, FOR CRANIAL PROCEDURE (Right)     Patient location during evaluation: PACU Anesthesia Type: General Level of consciousness: awake and alert Pain management: pain level controlled Vital Signs Assessment: post-procedure vital signs reviewed and stable Respiratory status: spontaneous breathing, nonlabored ventilation, respiratory function stable and patient connected to nasal cannula oxygen Cardiovascular status: blood pressure returned to baseline and stable Postop Assessment: no apparent nausea or vomiting Anesthetic complications: no   No notable events documented.  Last Vitals:  Vitals:   07/18/24 0700 07/18/24 0800  BP: 134/83 120/83  Pulse: (!) 107 92  Resp: (!) 22 (!) 24  Temp:  37.2 C  SpO2: 92% 96%    Last Pain:  Vitals:   07/18/24 0800  TempSrc: Oral  PainSc: 0-No pain                 [name] Cornea

## 2024-07-18 NOTE — Progress Notes (Addendum)
 Subjective: No acute events overnight.  No new concerns.  Had biopsy yesterday.  Sitting in recliner today.  Wife at bedside.  ROS: negative except above Examination  Vital signs in last 24 hours: Temp:  [97 F (36.1 C)-99 F (37.2 C)] 99 F (37.2 C) (09/09 0800) Pulse Rate:  [92-120] 108 (09/09 0900) Resp:  [16-29] 19 (09/09 0900) BP: (114-150)/(52-111) 148/82 (09/09 0900) SpO2:  [90 %-100 %] 97 % (09/09 0900) Weight:  [73.9 kg] 73.9 kg (09/09 0500)  General: lying in bed, NAD Neuro: MS: Alert, oriented to people, place but not to time, follows commands CN: pupils equal and reactive,  EOMI, face symmetric, tongue midline, normal sensation over face, Motor: 4+/5 strength in all 4 extremities Sensory: Intact to light touch.  Appears to have left sensory neglect at times Coordination: normal Gait: not tested  Basic Metabolic Panel: Recent Labs  Lab 07/12/24 0337 07/13/24 0450 07/14/24 0009 07/15/24 0550 07/16/24 0412 07/18/24 0529  NA 142 143 141 141 139 137  K 4.4 4.1 3.7 3.9 4.1 4.3  CL 110 111 105 108 103 104  CO2 21* 22 23 24 23  21*  GLUCOSE 251* 158* 215* 131* 145* 230*  BUN 21 22 24* 28* 27* 31*  CREATININE 0.91 0.70 0.89 0.79 0.76 0.81  CALCIUM  8.3* 8.1* 8.7* 8.7* 9.2 8.9  MG 2.2  --   --  2.2  --   --   PHOS 2.6  --   --   --   --   --     CBC: Recent Labs  Lab 07/14/24 0009 07/15/24 0550 07/16/24 0412 07/17/24 0644 07/18/24 0529  WBC 8.3 10.3 11.8* 15.0* 18.7*  HGB 13.2 13.7 15.1 14.2 15.0  HCT 40.3 41.9 45.1 42.6 44.2  MCV 94.2 93.9 91.9 91.0 90.8  PLT 249 259 265 342 371     Coagulation Studies: No results for input(s): LABPROT, INR in the last 72 hours.  Imaging personally reviewed  CT head without contrast 07/18/2024: Postoperative changes from interval right temporal craniotomy for biopsy of patient's known right cerebral mass. Small volume postoperative hemorrhage along the biopsy tract and within the resection cavity as above. No  other complicating features. The underlying right cerebral mass is otherwise grossly stable. No other new acute intracranial abnormality.    ASSESSMENT AND PLAN: 68 y.o. 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  Will consider bridging with aspirin  after 48 hours ( 9/10) as long as no excessive bleeding after biopsy -Of note, patient will need stat CT head and likely CTA head and neck as well for any acute worsening of mental status as patient is currently not anticoagulated and has A-fib -As needed Versed  for clinical seizures -Discussed plan with wife at bedside     I personally spent a total of 26 minutes in the care of the patient today including getting/reviewing separately obtained history, performing a medically appropriate exam/evaluation, counseling and educating, placing orders, referring and communicating with other health care professionals, documenting clinical information in the EHR, independently interpreting results, and coordinating care.      Wayne Molina [facility] For questions after 5pm please refer to AMION to reach the Neurologist on call

## 2024-07-19 ENCOUNTER — Other Ambulatory Visit: Payer: Self-pay | Admitting: Radiation Therapy

## 2024-07-19 DIAGNOSIS — C719 Malignant neoplasm of brain, unspecified: Secondary | ICD-10-CM

## 2024-07-19 DIAGNOSIS — D497 Neoplasm of unspecified behavior of endocrine glands and other parts of nervous system: Secondary | ICD-10-CM | POA: Diagnosis not present

## 2024-07-19 DIAGNOSIS — R569 Unspecified convulsions: Secondary | ICD-10-CM | POA: Diagnosis not present

## 2024-07-19 LAB — BASIC METABOLIC PANEL WITH GFR
Anion gap: 12 (ref 5–15)
BUN: 29 mg/dL — ABNORMAL HIGH (ref 8–23)
CO2: 23 mmol/L (ref 22–32)
Calcium: 8.8 mg/dL — ABNORMAL LOW (ref 8.9–10.3)
Chloride: 101 mmol/L (ref 98–111)
Creatinine, Ser: 0.71 mg/dL (ref 0.61–1.24)
GFR, Estimated: >60 mL/min (ref >60)
Glucose, Bld: 207 mg/dL — ABNORMAL HIGH (ref 70–99)
Potassium: 4.4 mmol/L (ref 3.5–5.1)
Sodium: 136 mmol/L (ref 135–145)

## 2024-07-19 LAB — GLUCOSE, CAPILLARY
Glucose-Capillary: 179 mg/dL — ABNORMAL HIGH (ref 70–99)
Glucose-Capillary: 203 mg/dL — ABNORMAL HIGH (ref 70–99)
Glucose-Capillary: 142 mg/dL — ABNORMAL HIGH (ref 70–99)
Glucose-Capillary: 193 mg/dL — ABNORMAL HIGH (ref 70–99)
Glucose-Capillary: 192 mg/dL — ABNORMAL HIGH (ref 70–99)

## 2024-07-19 LAB — SURGICAL PATHOLOGY

## 2024-07-19 MED ORDER — QUETIAPINE FUMARATE 25 MG PO TABS: 12.5000 mg | ORAL_TABLET | Freq: Every day | ORAL | Status: DC

## 2024-07-19 MED ORDER — ASPIRIN 81 MG PO CHEW
81.0000 mg | CHEWABLE_TABLET | Freq: Every day | ORAL | Status: DC
  Administered 2024-07-19 – 2024-07-20 (×2): 81 mg
  Filled 2024-07-19 (×2): qty 1

## 2024-07-19 MED ORDER — MELATONIN 3 MG PO TABS
3.0000 mg | ORAL_TABLET | Freq: Every day | ORAL | Status: DC
  Administered 2024-07-19 – 2024-08-03 (×16): 3 mg via ORAL
  Filled 2024-07-19 (×17): qty 1

## 2024-07-19 NOTE — Progress Notes (Signed)
 Daily Progress Note Intern Pager: [phone]  Patient name: Wayne Molina Medical record number: [id number] Date of birth: 1956/03/31 Age: 68 y.o. Gender: male  Primary Care Provider: Janna Ferrier, DO Consultants: Neurosurgery, Neurology Code Status: Full  Pt Overview and Major Events to Date:  8/29: Admitted, focal seizure in the ED, intubated and sent to the ICU 9/4: extubated 9/8: Neurosurgery completed open craniotomy for tumor biopsy   Medical Decision Making:  Wayne Molina is a 68 y.o. male with a PMH of HTN, HLD, Afib not currently on anticoagulation, OSA, and osteoporosis who presented with L sided neruo deficits and found to have a brain tumor. S/p open craniotomy and tumor biopsy on 9/8, surgical pathology reveals Grade IV glioma. Discussed results with neurology and neurosurgery, whom have consulted neuro-oncology.   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 Delirious. Afebrile.  - Neurology consulted, recs appreciated  - Restarted ASA 81 mg today after discussing with NSGY  - If any acute change in mental status since pt isn't anticoagulated, recommend CT head w/o contrast and CTA head and neck to r/o bleed/stroke  - PRN Versed  for clinical seizures - Neurosurgery consulted, appreciate recommendations  - Dr. Rosslyn discussed tumor results with patient at bedside with Dr. Janna and I - Per neurosurgery recommendations, holding off on palliative consult for now, until family can talk with neuro-oncology - Keppra  1,250 mg per tube BID - Dexamethasone  4 mg IV Q8H - Bethanechol  5 mg TID for urinary retention in the ICU, currently also has a urinary catheter - Pain regimen: Acetaminophen  650 mg every Q4H PRN - Lantus  5 units QHS and SSI w/ steroids to maintain glucose 140-180 - Nausea regimen: Zofran  4 mg PRN - Delirium: Melatonin 3 mg at 6pm and could consider adding Seroquel  12.5 mg at 6 pm to minimize  delirum/sundowing prn - Continuous cardiac monitoring - Neuro checks Q4H - PT/OT/SLP consulted, appreciate recommendations - Fall and seizure precautions - AM Labs: BMP with GFR Hypertension Normotensive at this time. Holding home Losartan .  - Hydralazine  50 mg every Q8H per tube - Coreg  6.25 mg BID per tube Malnutrition of moderate degree Smallbore feeding tube left nare. - Feeding supplement (Osmolite 1.5 Cal) liquid 50 mL/hr continuous until discontinued - Feeding supplement (Prosource TF 20) liquid 60 mL daily - Fiber supplement (Banatrol TF) liquid 60 mL BID - Mupirocin  ointment 2% twice daily - Protonix  40 mg daily Chronic health problem HLD - continue home Atorvastatin  40mg  daily Afib - holding home Eliquis  5mg  BID, continue Coreg  6.25 mg BID for rate control. Restart ASA 81 mg today per neuro for bridge while off anticoagulation for 7 days s/p biopsy, likely will restart on 9/16    FEN/GI: Dysphagia 2 + feeding supplement PPx: SCDs Dispo: Pending clinical improvement.   Subjective:  Patient seen on morning rounds sitting in recliner with wife present in room.  Patient is awake and begins talking about doing push-ups in the [employer].  He also begins speaking about his shorts that he states are in the closet in the room.  His wife attempts to explain that she took his shorts home to wash and that there is no closet.  Per patient's wife, patient is in the same state that he was yesterday.  She explains that he is not typically confused as he is now.  However, he has been unable to sleep since his craniotomy with biopsy on Monday.  Wife states since that time he has been awake and talking.  Objective: Temp:  [97 F (36.1 C)-98.5 F (36.9 C)] 97.6 F (36.4 C) (09/10 1152) Pulse Rate:  [80-101] 88 (09/10 1152) Resp:  [16-24] 17 (09/10 1152) BP: (113-138)/(70-81) 138/81 (09/10 1152) SpO2:  [92 %-100 %] 93 % (09/10 1152) Weight:  [74 kg] 74 kg (09/10 0702) Physical  Exam: General: Ill-appearing male, NAD Cardiovascular: RRR, no M/R/G Respiratory: CTAB anteriorly, normal work of breathing on room air Abdomen: Soft, nondistended, nontender to palpation Extremities: Thin, cool, no edema  Laboratory: Most recent CBC Lab Results  Component Value Date   WBC 18.7 (H) 07/18/2024   HGB 15.0 07/18/2024   HCT 44.2 07/18/2024   MCV 90.8 07/18/2024   PLT 371 07/18/2024   Most recent BMP    Latest Ref Rng & Units 07/19/2024    7:39 AM  BMP  Glucose 70 - 99 mg/dL 792   BUN 8 - 23 mg/dL 29   Creatinine 9.38 - 1.24 mg/dL 9.28   Sodium 864 - 854 mmol/L 136   Potassium 3.5 - 5.1 mmol/L 4.4   Chloride 98 - 111 mmol/L 101   CO2 22 - 32 mmol/L 23   Calcium  8.9 - 10.3 mg/dL 8.8     Imaging/Diagnostic Tests: CT head 9/9: 1. Postoperative changes from interval right temporal craniotomy for biopsy of patient's known right cerebral mass. Small volume postoperative hemorrhage along the biopsy tract and within the resection cavity as above. No other complicating features. The underlying right cerebral mass is otherwise grossly stable. 2. No other new acute intracranial abnormality.  [name], DO 07/19/2024, 12:13 PM  PGY-1, [facility] FPTS Intern pager: [phone], text pages welcome Secure chat group [facility] Teaching Service

## 2024-07-19 NOTE — Evaluation (Signed)
 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 07/07/2024   Concern for Stroke (HCC) 07/07/2024   Acute encephalopathy 07/07/2024   Right foot pain 12/16/2022   Chest pain 08/17/2022   Postural dizziness with presyncope 06/10/2022   Genetic testing 03/19/2021   Family history of gene mutation    Family history of breast cancer    Family history of malignant neoplasm of gastrointestinal tract    Family history of cancer of male genital organ    Peyronie disease 06/12/2020   Elevated bilirubin 05/26/2019   Lower urinary tract symptoms (LUTS) 05/24/2019   Osteoporosis 01/27/2017   Erectile dysfunction 01/29/2016   Syncope 01/01/2016   Lumbar vertebral fracture (HCC) 07/22/2015   Hyperlipidemia 07/22/2015   Myocardiopathy (HCC) 07/11/2013   Hypertension 05/23/2012   Sleep apnea: unable to tolerate CPAP 03/04/2011   Atrial fibrillation (HCC) 01/06/2007   Past Medical History:  Past Medical History:  Diagnosis Date   Anxiety    Arthritis    fingers   Chronic atrial fibrillation (HCC)    Depression    Family history of breast cancer    Family history of cancer of male genital organ    Family history of gene mutation    BRIP1   Family history of malignant neoplasm of gastrointestinal tract    Hyperlipidemia    Hypertension    Personal history of colonic polyps 11/12/2006   Sleep apnea    Past Surgical History:  Past Surgical  History:  Procedure Laterality Date   2-D echocardiogram  07/22/2010   Ejection fraction greater than 55%. Left atrium moderately dilated. Right atrium moderately dilated. 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 Mild to Moderate MR. Mild to moderate TR.   APPLICATION OF CRANIAL NAVIGATION Right 07/17/2024   Procedure: COMPUTER-ASSISTED NAVIGATION, FOR CRANIAL PROCEDURE;  Surgeon: Rosslyn Dino HERO, MD;  Location: [facility] OR;  Service: Neurosurgery;  Laterality: Right;  CRANIAL NAVIGATION   ATRIAL ABLATION SURGERY  2004   EP IMPLANTABLE DEVICE N/A 02/11/2016   Procedure: Loop Recorder Insertion;  Surgeon: Jerel Balding, MD;  Location: [facility] INVASIVE CV LAB;  Service: Cardiovascular;  Laterality: N/A;   Exercise Myoview stress test  07/08/2000   Nonischemic low-risk.   HIP FRACTURE SURGERY  1970s   IR GENERIC HISTORICAL  01/21/2017   IR RADIOLOGIST EVAL & MGMT 01/21/2017 [facility]-INTERV RAD   STERIOTACTIC STIMULATOR INSERTION Right 07/17/2024   Procedure: OPEN CRANIOTOMY FOR BIOPSY;  Surgeon: Rosslyn Dino HERO, MD;  Location: [facility] OR;  Service: Neurosurgery;  Laterality: Right;  RIGHT STEREOTACTIC BRAIN BIOPSY   HPI:  Patient is a 68 y.o. man who [facility] 8/28, with AMS, Code Stroke. PMHx significant for HTN, HLD, Afib on Eliquis , untreated OSA. Patient presented to ER as a Code Stroke after coworker noticed he was altered since 8/28 around 2230 and pale, patient reported feeling dizzy. In ER, noted to have L sided weakness and left hemianopsia. CT Head initially concerning for right posterior MCA stroke but CTA negative  for LVO. Repeat CT Head concerning for mass. Course complicated by seizure episode. Intubated 8/29, extubated 9/4 but still with difficulty managing secreations. 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 Patient was intubated for open craniotomy for biopsy (right head) on 09/08 and extubated in  OR.   Assessment / Plan / Recommendation Clinical Impression  Patient presents with a moderate cognitive impairment as per this evaluation. His primary areas of impairment are in attention, awareness and initiation. He was oriented to place, year, month, approximate day of week but not date. During the evaluation he was very drowsy and per family members, he was agitated last night and only slept 20 minutes after getting a Valium . Patient's speech is low in intensity and dysarthric overall. He did recall that occupational therapy worked with him today but was not able to provide specific information on what they did with him. Per spouse he did sit up in the recliner some. SLP will follow and plan to continue assessing his cognition.    SLP Assessment  SLP Recommendation/Assessment: Patient needs continued Speech Language Pathology Services SLP Visit Diagnosis: Cognitive communication deficit (R41.841)     Assistance Recommended at Discharge  Frequent or constant Supervision/Assistance  Functional Status Assessment Patient has had a recent decline in their functional status and demonstrates the ability to make significant improvements in function in a reasonable and predictable amount of time.  Frequency and Duration min 2x/week  2 weeks      SLP Evaluation Cognition  Overall Cognitive Status: Impaired/Different from baseline Arousal/Alertness: Lethargic Orientation Level: Oriented to person;Oriented to place;Disoriented to situation;Oriented to time Year: 2025 Month: September Day of Week: Incorrect Attention: Sustained Sustained Attention: Impaired Sustained Attention Impairment: Verbal basic;Functional basic Memory: Impaired Memory Impairment: Decreased recall of new information Awareness: Impaired Awareness Impairment: Intellectual impairment Problem Solving: Impaired Problem Solving Impairment: Verbal basic;Functional basic Executive Function: Initiating Initiating:  Impaired Initiating Impairment: Verbal basic;Functional basic Safety/Judgment: Impaired       Comprehension  Auditory Comprehension Overall Auditory Comprehension: Other (comment) (further assessment needed)    Expression Expression Primary Mode of Expression: Verbal Verbal Expression Overall Verbal Expression: Appears within functional limits for tasks assessed Initiation: No impairment Pragmatics: Impairment Impairments: Topic maintenance Interfering Components: Attention Non-Verbal Means of Communication: Not applicable   Oral / Motor  Oral Motor/Sensory Function Overall Oral Motor/Sensory Function: Within functional limits Motor Speech Overall Motor Speech: Appears within functional limits for tasks assessed Respiration: Within functional limits Resonance: Within functional limits Intelligibility: Intelligibility reduced Word: 50-74% accurate Phrase: 50-74% accurate Sentence: 50-74% accurate Conversation: Not tested Motor Planning: Within functional limits Motor Speech Errors: Not applicable            Norleen IVAR Blase, MA, CCC-SLP Speech Therapy

## 2024-07-19 NOTE — Assessment & Plan Note (Addendum)
 Alert and oriented x3. Delirious. Afebrile.  - Neurology consulted, recs appreciated  - Restarted ASA 81 mg today after discussing with NSGY  - If any acute change in mental status since pt isn't anticoagulated, recommend CT head w/o contrast and CTA head and neck to r/o bleed/stroke  - PRN Versed  for clinical seizures - Neurosurgery consulted, appreciate recommendations  - Dr. Rosslyn discussed tumor results with patient at bedside with Dr. Janna and I - Per neurosurgery recommendations, holding off on palliative consult for now, until family can talk with neuro-oncology - Keppra  1,250 mg per tube BID - Dexamethasone  4 mg IV Q8H - Bethanechol  5 mg TID for urinary retention in the ICU, currently also has a urinary catheter - Pain regimen: Acetaminophen  650 mg every Q4H PRN - Lantus  5 units QHS and SSI w/ steroids to maintain glucose 140-180 - Nausea regimen: Zofran  4 mg PRN - Delirium: Melatonin 3 mg at 6pm and could consider adding Seroquel  12.5 mg at 6 pm to minimize delirum/sundowing prn - Continuous cardiac monitoring - Neuro checks Q4H - PT/OT/SLP consulted, appreciate recommendations - Fall and seizure precautions - AM Labs: BMP with GFR

## 2024-07-19 NOTE — Assessment & Plan Note (Signed)
 Smallbore feeding tube left nare. - Feeding supplement (Osmolite 1.5 Cal) liquid 50 mL/hr continuous until discontinued - Feeding supplement (Prosource TF 20) liquid 60 mL daily - Fiber supplement (Banatrol TF) liquid 60 mL BID - Mupirocin  ointment 2% twice daily - Protonix  40 mg daily

## 2024-07-19 NOTE — Progress Notes (Addendum)
 Subjective: No acute events overnight.  However per brother at bedside, patient had trouble sleeping last night and required Valium  therefore very sleepy today.  ROS: Unable to obtain due to poor mental status  Examination  Vital signs in last 24 hours: Temp:  [97 F (36.1 C)-98.5 F (36.9 C)] 97.6 F (36.4 C) (09/10 1152) Pulse Rate:  [80-101] 88 (09/10 1152) Resp:  [16-24] 17 (09/10 1152) BP: (113-138)/(70-81) 138/81 (09/10 1152) SpO2:  [92 %-100 %] 93 % (09/10 1152) Weight:  [74 kg] 74 kg (09/10 0702)  General: lying in bed, NAD Neuro: Opens eyes to repeated verbal stimulation, trying to say his name but speech is very dysarthric, did not follow commands, PERRLA, antigravity strength in all 4 extremities  Basic Metabolic Panel: Recent Labs  Lab 07/14/24 0009 07/15/24 0550 07/16/24 0412 07/18/24 0529 07/19/24 0739  NA 141 141 139 137 136  K 3.7 3.9 4.1 4.3 4.4  CL 105 108 103 104 101  CO2 23 24 23  21* 23  GLUCOSE 215* 131* 145* 230* 207*  BUN 24* 28* 27* 31* 29*  CREATININE 0.89 0.79 0.76 0.81 0.71  CALCIUM  8.7* 8.7* 9.2 8.9 8.8*  MG  --  2.2  --   --   --     CBC: Recent Labs  Lab 07/14/24 0009 07/15/24 0550 07/16/24 0412 07/17/24 0644 07/18/24 0529  WBC 8.3 10.3 11.8* 15.0* 18.7*  HGB 13.2 13.7 15.1 14.2 15.0  HCT 40.3 41.9 45.1 42.6 44.2  MCV 94.2 93.9 91.9 91.0 90.8  PLT 249 259 265 342 371     Coagulation Studies: No results for input(s): LABPROT, INR in the last 72 hours.  Imaging No new brain imaging overnight   ASSESSMENT AND PLAN:68 y.o. 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   -Messaged Dr. Janjua to see if he can start aspirin  today -Of note, patient will need stat CT head and likely CTA  head and neck as well for any acute worsening of mental status as patient is currently not anticoagulated and has A-fib - Discussed with medicine team.  Plan to start melatonin.  However if it does not work, can consider Seroquel  12.5 mg at 6 PM to minimize delirium and sundowning -As needed Versed  for clinical seizures -I discussed biopsy findings with brother at bedside.  I have also messaged Dr. [name] (neuro-oncology) -Discussed plan with brother at bedside and medicine team via secure chat     I personally spent a total of 35 minutes in the care of the patient today including getting/reviewing separately obtained history, performing a medically appropriate exam/evaluation, counseling and educating, placing orders, referring and communicating with other health care professionals, documenting clinical information in the EHR, independently interpreting results, and coordinating care.        Wayne Molina [facility] For questions after 5pm please refer to AMION to reach the Neurologist on call

## 2024-07-19 NOTE — Progress Notes (Signed)
 Occupational Therapy Treatment Patient Details Name: Wayne Molina MRN: [id number] DOB: August 31, 1956 Today's Date: 07/19/2024   History of present illness Pt is a 68 y/o male presenting on 8/28 with L arm weakness, L hemianopsia, and slurred speech. 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 Course complicated by seizure episode. Intubated 8/29, extubated 9/4 but still with difficulty managing secretions.  Biopsy on 9/8. PMH includes: anxiety, HLD, HTN, sleep apnea, chronic afib.   OT comments  Pt supine in bed and agreeable to OT/PT session.  Spouse at side and reports pt has been wanting to get OOB all night.  Pt completing bed mobility with mod assist +2, improved sitting balance EOB with min assist and occasional L lateral/posterior lean, max assist +2 for transfers using stedy today.  Requires min to total assist +2 for ADLs.  Remains limited by impaired cognition, impaired balance, L sided visual deficits and inattention. Pt progressively able to scan further to L side with practice and cueing, but will need further visual assessment. Continue to recommend >3hrs/day inpatient setting at dc.  Will follow.       If plan is discharge home, recommend the following:  Two people to help with walking and/or transfers;A lot of help with bathing/dressing/bathroom;Assistance with cooking/housework;Direct supervision/assist for medications management;Direct supervision/assist for financial management;Assist for transportation;Help with stairs or ramp for entrance   Equipment Recommendations  Other (comment) (defer)    Recommendations for Other Services      Precautions / Restrictions Precautions Precautions: Fall;Other (comment) Recall of Precautions/Restrictions: Impaired Precaution/Restrictions Comments: cortrak, watch vitals; flexiseal, SBP <160 Restrictions Weight Bearing Restrictions Per Provider Order: No        Mobility Bed Mobility Overal bed mobility: Needs Assistance Bed Mobility: Sidelying to Sit, Rolling Rolling: Mod assist Sidelying to sit: Mod assist, HOB elevated, Used rails, +2 for physical assistance       General bed mobility comments: step by step cueing with intermittent following of commands. Moving LE's in opposite direction than cued. ModAx2 to bring LE's off EOB and raise trunk    Transfers Overall transfer level: Needs assistance Equipment used: 2 person hand held assist, Ambulation equipment used Transfers: Bed to chair/wheelchair/BSC, Sit to/from Stand Sit to Stand: +2 physical assistance, Max assist, Via lift equipment           General transfer comment: First stand with MaxAx2 and 2HH, L knee blocked. Unable to reach upright posture with poor extension activation. Transfer via Herby with MaxAx2, ModAx2 from elevated Stedy flaps Transfer via Lift Equipment: Stedy   Balance Overall balance assessment: Needs assistance Sitting-balance support: Feet supported, No upper extremity supported Sitting balance-Leahy Scale: Poor Sitting balance - Comments: MinA for slight left lateral lean Postural control: Left lateral lean, Posterior lean Standing balance support: Bilateral upper extremity supported, Reliant on assistive device for balance, During functional activity Standing balance-Leahy Scale: Zero Standing balance comment: MaxAx2 for standing balanced, forward flexed posture                           ADL either performed or assessed with clinical judgement   ADL Overall ADL's : Needs assistance/impaired     Grooming: Minimal assistance;Sitting           Upper Body Dressing : Moderate assistance;Sitting   Lower Body Dressing: +2 for physical assistance;Sit to/from stand;Maximal assistance   Toilet Transfer: Maximal assistance;+2 for  physical assistance Toilet Transfer Details (indicate cue type and reason): to recliner using  stedy Toileting- Clothing Manipulation and Hygiene: Total assistance       Functional mobility during ADLs: Moderate assistance;Maximal assistance;+2 for physical assistance;+2 for safety/equipment      Extremity/Trunk Assessment Upper Extremity Assessment Upper Extremity Assessment: Left hand dominant;LUE deficits/detail LUE Deficits / Details: 3-/5 globally LUE Sensation: decreased light touch LUE Coordination: decreased fine motor;decreased gross motor            Vision   Vision Assessment?: Vision impaired- to be further tested in functional context Additional Comments: pt with preference to R visual field, able to turn head to L side with cueing and progressively able to scan further to L side with practice and cueing. Vision remains difficult to assess due to cognition and inability to follow multiple step commands.  Unable to locate items on L side.   Perception Perception Perception: Impaired Preception Impairment Details: Inattention/Neglect Perception-Other Comments: L   Praxis Praxis Praxis: Impaired Praxis Impairment Details: Motor planning;Organization   Communication Communication Communication: Impaired Factors Affecting Communication: Reduced clarity of speech (loquacious)   Cognition Arousal: Alert Behavior During Therapy: WFL for tasks assessed/performed Cognition: Cognition impaired   Orientation impairments:  (situation not assessed) Awareness: Online awareness impaired Memory impairment (select all impairments): Working [employer] fast streamer, Short-term memory Attention impairment (select first level of impairment): Sustained attention Executive functioning impairment (select all impairments): Problem solving, Reasoning, Sequencing, Organization OT - Cognition Comments: verbose, tangential, needs mod cues to attend to task/follow single step instructions throughout session                 Following commands: Impaired Following commands impaired: Follows  one step commands with increased time, Follows multi-step commands inconsistently, Follows one step commands inconsistently      Cueing   Cueing Techniques: Verbal cues, Tactile cues, Visual cues  Exercises      Shoulder Instructions       General Comments spouse in room and supportive    Pertinent Vitals/ Pain       Pain Assessment Pain Assessment: No/denies pain Pain Intervention(s): Monitored during session  Home Living                                          Prior Functioning/Environment              Frequency  Min 2X/week        Progress Toward Goals  OT Goals(current goals can now be found in the care plan section)  Progress towards OT goals: Progressing toward goals  Acute Rehab OT Goals Patient Stated Goal: did not state OT Goal Formulation: With patient Time For Goal Achievement: 07/30/24 Potential to Achieve Goals: Good  Plan      Co-evaluation    PT/OT/SLP Co-Evaluation/Treatment: Yes Reason for Co-Treatment: Complexity of the patient's impairments (multi-system involvement);Necessary to address cognition/behavior during functional activity;For patient/therapist safety;To address functional/ADL transfers PT goals addressed during session: Mobility/safety with mobility;Balance;Strengthening/ROM OT goals addressed during session: ADL's and self-care      AM-PAC OT 6 Clicks Daily Activity     Outcome Measure   Help from another person eating meals?: A Lot Help from another person taking care of personal grooming?: A Lot Help from another person toileting, which includes using toliet, bedpan, or urinal?: Total Help from another person bathing (including washing, rinsing, drying)?: A Lot Help  from another person to put on and taking off regular upper body clothing?: A Lot Help from another person to put on and taking off regular lower body clothing?: A Lot 6 Click Score: 11    End of Session Equipment Utilized During  Treatment: Gait belt  OT Visit Diagnosis: Unsteadiness on feet (R26.81);Other abnormalities of gait and mobility (R26.89);Muscle weakness (generalized) (M62.81)   Activity Tolerance Patient tolerated treatment well   Patient Left in chair;with call bell/phone within reach;with chair alarm set;with family/visitor present   Nurse Communication Mobility status        Time: 9154-9081 OT Time Calculation (min): 33 min  Charges: OT General Charges $OT Visit: 1 Visit OT Treatments $Self Care/Home Management : 8-22 mins  Etta NOVAK, OT Acute Rehabilitation Services Office [phone] Secure Chat Preferred    Etta GORMAN Hope 07/19/2024, 11:10 AM

## 2024-07-19 NOTE — Progress Notes (Signed)
 Physical Therapy Treatment Patient Details Name: Wayne Molina MRN: [id number] DOB: Mar 14, 1956 Today's Date: 07/19/2024   History of Present Illness Pt is a 68 y/o male presenting on 8/28 with L arm weakness, L hemianopsia, and slurred speech. 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 Course complicated by seizure episode. Intubated 8/29, extubated 9/4 but still with difficulty managing secretions.  Biopsy on 9/8. PMH includes: anxiety, HLD, HTN, sleep apnea, chronic afib.   PT Comments  Pt making progress towards acute PT goals. Pt continues to be tangential with speech with intermittent following of commands and decreased short term memory. Pt improved by being able to stand with MaxAx2 and [facility] with blocking of LLE. Continues to have difficulty with hip extension with forward flexed posture. Used Stedy to transfer to the recliner with MaxAx2 and assist to bring L UE to the handle. Noted L inattention during session with difficulty visually tracking to the left. Continue to recommend >3hrs post acute rehab to maximize rehab potential. Acute PT to follow.    If plan is discharge home, recommend the following: Two people to help with walking and/or transfers;Two people to help with bathing/dressing/bathroom;Assistance with cooking/housework;Assistance with feeding;Direct supervision/assist for medications management;Assist for transportation;Direct supervision/assist for financial management;Supervision due to cognitive status   Can travel by private vehicle      No  Equipment Recommendations  Wheelchair (measurements PT);Wheelchair cushion (measurements PT);Hospital bed;Hoyer lift       Precautions / Restrictions Precautions Precautions: Fall;Other (comment) Recall of Precautions/Restrictions: Impaired Precaution/Restrictions Comments: cortrak, watch vitals; flexiseal, SBP <160 Restrictions Weight Bearing  Restrictions Per Provider Order: No     Mobility  Bed Mobility Overal bed mobility: Needs Assistance Bed Mobility: Sidelying to Sit, Rolling Rolling: Mod assist Sidelying to sit: Mod assist, HOB elevated, Used rails, +2 for physical assistance     General bed mobility comments: step by step cueing with intermittent following of commands. Moving LE's in opposite direction than cued. ModAx2 to bring LE's off EOB and raise trunk    Transfers Overall transfer level: Needs assistance Equipment used: 2 person hand held assist, Ambulation equipment used Transfers: Bed to chair/wheelchair/BSC, Sit to/from Stand Sit to Stand: +2 physical assistance, Max assist, Via lift equipment    General transfer comment: First stand with MaxAx2 and 2HH, L knee blocked. Unable to reach upright posture with poor extension activation. Transfer via Herby with MaxAx2, ModAx2 from elevated Stedy flaps Transfer via Lift Equipment: Stedy   Modified Rankin (Stroke Patients Only) Modified Rankin (Stroke Patients Only) Pre-Morbid Rankin Score: No symptoms Modified Rankin: Severe disability     Balance Overall balance assessment: Needs assistance Sitting-balance support: Feet supported, No upper extremity supported Sitting balance-Leahy Scale: Poor Sitting balance - Comments: MinA for slight left lateral lean Postural control: Left lateral lean Standing balance support: Bilateral upper extremity supported, Reliant on assistive device for balance, During functional activity Standing balance-Leahy Scale: Zero Standing balance comment: MaxAx2 for standing balanced, forward flexed posture     Communication Communication Communication: Impaired Factors Affecting Communication: Reduced clarity of speech (loquacious)  Cognition Arousal: Alert Behavior During Therapy: WFL for tasks assessed/performed   PT - Cognitive impairments: Awareness, Memory, Attention, Initiation, Sequencing, Problem solving,  Safety/Judgement    PT - Cognition Comments: Decreased L attention and visual tracking to the left. Tangential speech with decreased short term memory. Following commands: Impaired Following commands impaired: Follows one step commands with increased  time, Follows multi-step commands inconsistently, Follows one step commands inconsistently    Cueing Cueing Techniques: Verbal cues, Tactile cues, Visual cues  Exercises General Exercises - Lower Extremity Long Arc Quad: AROM, Both, 10 reps, Seated    General Comments General comments (skin integrity, edema, etc.): Wife in room and supportive      Pertinent Vitals/Pain Pain Assessment Pain Assessment: No/denies pain     PT Goals (current goals can now be found in the care plan section) Acute Rehab PT Goals Patient Stated Goal: to return to work PT Goal Formulation: With patient/family Time For Goal Achievement: 07/29/24 Potential to Achieve Goals: Fair Progress towards PT goals: Progressing toward goals    Frequency    Min 3X/week           Co-evaluation   Reason for Co-Treatment: Complexity of the patient's impairments (multi-system involvement);Necessary to address cognition/behavior during functional activity;For patient/therapist safety;To address functional/ADL transfers PT goals addressed during session: Mobility/safety with mobility;Balance;Strengthening/ROM        AM-PAC PT 6 Clicks Mobility   Outcome Measure  Help needed turning from your back to your side while in a flat bed without using bedrails?: Total Help needed moving from lying on your back to sitting on the side of a flat bed without using bedrails?: Total Help needed moving to and from a bed to a chair (including a wheelchair)?: Total Help needed standing up from a chair using your arms (e.g., wheelchair or bedside chair)?: Total Help needed to walk in hospital room?: Total Help needed climbing 3-5 steps with a railing? : Total 6 Click Score:  6    End of Session Equipment Utilized During Treatment: Gait belt Activity Tolerance: Patient tolerated treatment well Patient left: in chair;with call bell/phone within reach;with chair alarm set Nurse Communication: Mobility status PT Visit Diagnosis: Unsteadiness on feet (R26.81);Other abnormalities of gait and mobility (R26.89);Muscle weakness (generalized) (M62.81);Difficulty in walking, not elsewhere classified (R26.2);Other symptoms and signs involving the nervous system (R29.898)     Time: 9154-9081 PT Time Calculation (min) (ACUTE ONLY): 33 min  Charges:    $Therapeutic Activity: 8-22 mins PT General Charges $$ ACUTE PT VISIT: 1 Visit                    Kate ORN, PT, DPT Secure Chat Preferred  Rehab Office [phone]   Kate BRAVO [name] 07/19/2024, 9:30 AM

## 2024-07-19 NOTE — Assessment & Plan Note (Addendum)
 HLD - continue home Atorvastatin  40mg  daily Afib - holding home Eliquis  5mg  BID, continue Coreg  6.25 mg BID for rate control. Restart ASA 81 mg today per neuro for bridge while off anticoagulation for 7 days s/p biopsy, likely will restart on 9/16

## 2024-07-19 NOTE — Assessment & Plan Note (Deleted)
 GCS 8. Likely brain mass vs. stroke. Patient is lethargic at bedside likely 2/2 post-ictal state and Keppra  administration - Neurology consulted, recs appreciated - Neurosurgery consulted, appreciate recommendations  - N - Echo - MRI brain w/wo contrast - BMP, CBC, HbA1c, lipid panel, TSH - Aspirin  suppository 150mg  - Holding home Eliquis  5mg  and Losartan  100mg  - s/p loading dose Keppra  4500mg  - Continuous EEG - Continuous cardiac monitoring - Neuro checks q4hrs - PT/OT eval - SLP eval - Fall and seizure precautions - 2mg  IV Ativan  for seizure last greater than 5 min

## 2024-07-19 NOTE — Progress Notes (Signed)
 Nutrition Follow-up  DOCUMENTATION CODES:  Non-severe (moderate) malnutrition in context of chronic illness  INTERVENTION:  Continue current diet as ordered Encourage PO intake Family to assist with ordering meals Continue tube feeding via Cortrak: Osmolite 1.5 @ 50 mL/hr (1200 mL/day) PROSource TF20 60 mL daily Tube feeding regimen provides 1880 kcal, 95 grams of protein, and 914 ml of H2O. Continue Banatrol BID  NUTRITION DIAGNOSIS:  Moderate Malnutrition related to chronic illness as evidenced by moderate fat depletion, severe muscle depletion. 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 PMH significant for afib on eliquis , HTN, HLD.  8/28: admitted 8/29: intubated d/t acute hypoxic respiratory failure 8/31: tube feeding initiated 9/2 - Cortrak placed, tip gastric  9/4 - extubated 9/8 - Op, right craniotomy to obtain brain tumor biopsy 9/9 - SLP BSE, DYS2, thin  Pt resting in bed at the time of assessment awake, but a little confused. Family at bedside assists with hx. Family states that pt had a few bites of breakfast and lunch but that he has been sleepy. Discussed that once pt is eating consistently and able to support himself nutritionally, cortrak can be removed. Provided with a menu and encouraged family to order food that pt likes to encourage PO intake. Do suspect that pt will eat more as mention improves. Will continue to monitor for ability to stop enteral feeds.  Admit weight: 73.9 kg  Current weight: 74 kg    Nutritionally Relevant Medications: Scheduled Meds:  atorvastatin   40 mg Per Tube Daily   dexamethasone  injection  4 mg Intravenous Q8H   PROSource TF20  60 mL Per Tube Daily    BANATROL TF  60 mL Per Tube BID   free water   200 mL Per Tube Q6H   insulin  aspart  0-15 Units Subcutaneous Q4H   insulin  glargine  5 Units Subcutaneous QHS   pantoprazole  (PROTONIX ) IV  40 mg Intravenous QHS   Continuous Infusions:  feeding supplement (OSMOLITE 1.5 CAL) 1,000 mL (07/19/24 0850)   PRN Meds: ondansetron , promethazine   Labs Reviewed: BUN 29 CBG ranges from 142-244 mg/dL over the last 24 hours HgbA1c 6.0% (8/29)  NUTRITION - FOCUSED PHYSICAL EXAM: Flowsheet Row Most Recent Value  Orbital Region Moderate depletion  Upper Arm Region Mild depletion  Thoracic and Lumbar Region Mild depletion  Buccal Region Moderate depletion  Temple Region Severe depletion  Clavicle Bone Region Moderate depletion  Clavicle and Acromion Bone Region Moderate depletion  Scapular Bone Region Unable to assess  Dorsal Hand Moderate depletion  Patellar Region Severe depletion  Anterior Thigh Region Severe depletion  Posterior Calf Region Moderate depletion  Edema (RD Assessment) None  Hair Reviewed  Eyes Reviewed  Mouth Reviewed  Skin Reviewed  Nails Reviewed    Diet Order:   Diet Order             DIET DYS 2 Room service appropriate? Yes; Fluid consistency: Thin  Diet effective now                   EDUCATION NEEDS:  Not appropriate for education at this time  Skin:  Skin Assessment: Skin Integrity Issues: (irritant dermatitis, buttocks)  Last BM:  9/9 - type 7 via pouch  Height:  Ht Readings from Last 1 Encounters:  07/07/24 5' 9 (1.753 m)    Weight:  Wt Readings from Last 1 Encounters:  07/19/24 74 kg    Ideal Body Weight:  72.7 kg  BMI:  Body mass index is 24.09 kg/m.  Estimated Nutritional Needs:  Kcal:  1800-2000 Protein:  95-110g Fluid:  >/=1.8L    Vernell Lukes, RD, LDN, CNSC Registered Dietitian II Please reach out via secure chat

## 2024-07-19 NOTE — Assessment & Plan Note (Signed)
 Normotensive at this time. Holding home Losartan .  - Hydralazine  50 mg every Q8H per tube - Coreg  6.25 mg BID per tube

## 2024-07-19 NOTE — Progress Notes (Signed)
   Inpatient Rehabilitation Admissions Coordinator   I will place rehab consult for assessment of candidacy for possible CIR admit.  Heron Leavell, RN, MSN Rehab Admissions Coordinator [phone] 07/19/2024 10:25 AM

## 2024-07-20 ENCOUNTER — Inpatient Hospital Stay (HOSPITAL_COMMUNITY)

## 2024-07-20 DIAGNOSIS — C719 Malignant neoplasm of brain, unspecified: Secondary | ICD-10-CM | POA: Diagnosis not present

## 2024-07-20 DIAGNOSIS — R569 Unspecified convulsions: Secondary | ICD-10-CM | POA: Diagnosis not present

## 2024-07-20 LAB — BASIC METABOLIC PANEL WITH GFR
Anion gap: 11 (ref 5–15)
BUN: 29 mg/dL — ABNORMAL HIGH (ref 8–23)
CO2: 22 mmol/L (ref 22–32)
Calcium: 9.2 mg/dL (ref 8.9–10.3)
Chloride: 102 mmol/L (ref 98–111)
Creatinine, Ser: 0.79 mg/dL (ref 0.61–1.24)
GFR, Estimated: >60 mL/min (ref >60)
Glucose, Bld: 108 mg/dL — ABNORMAL HIGH (ref 70–99)
Potassium: 4.7 mmol/L (ref 3.5–5.1)
Sodium: 135 mmol/L (ref 135–145)

## 2024-07-20 LAB — GLUCOSE, CAPILLARY
Glucose-Capillary: 128 mg/dL — ABNORMAL HIGH (ref 70–99)
Glucose-Capillary: 153 mg/dL — ABNORMAL HIGH (ref 70–99)
Glucose-Capillary: 155 mg/dL — ABNORMAL HIGH (ref 70–99)
Glucose-Capillary: 160 mg/dL — ABNORMAL HIGH (ref 70–99)
Glucose-Capillary: 160 mg/dL — ABNORMAL HIGH (ref 70–99)
Glucose-Capillary: 200 mg/dL — ABNORMAL HIGH (ref 70–99)
Glucose-Capillary: 232 mg/dL — ABNORMAL HIGH (ref 70–99)

## 2024-07-20 MED ORDER — HYDRALAZINE HCL 50 MG PO TABS
50.0000 mg | ORAL_TABLET | Freq: Three times a day (TID) | ORAL | Status: DC
  Filled 2024-07-20: qty 1

## 2024-07-20 MED ORDER — QUETIAPINE FUMARATE 25 MG PO TABS
12.5000 mg | ORAL_TABLET | Freq: Every day | ORAL | Status: DC
  Administered 2024-07-20 – 2024-07-21 (×2): 12.5 mg via ORAL
  Filled 2024-07-20 (×3): qty 1

## 2024-07-20 MED ORDER — GUAIFENESIN 100 MG/5ML PO LIQD
15.0000 mL | Freq: Four times a day (QID) | ORAL | Status: DC
  Administered 2024-07-20 – 2024-07-30 (×40): 15 mL via ORAL
  Filled 2024-07-20 (×41): qty 15

## 2024-07-20 MED ORDER — ZINC OXIDE 40 % EX OINT
TOPICAL_OINTMENT | Freq: Two times a day (BID) | CUTANEOUS | Status: DC | PRN
  Filled 2024-07-20 (×2): qty 57

## 2024-07-20 MED ORDER — CARVEDILOL 6.25 MG PO TABS
6.2500 mg | ORAL_TABLET | Freq: Two times a day (BID) | ORAL | Status: DC
  Administered 2024-07-20 – 2024-07-21 (×2): 6.25 mg via ORAL
  Filled 2024-07-20 (×2): qty 1

## 2024-07-20 MED ORDER — ACETAMINOPHEN 160 MG/5ML PO SOLN: 650.0000 mg | ORAL | Status: DC | PRN

## 2024-07-20 MED ORDER — LEVETIRACETAM 750 MG PO TABS
1250.0000 mg | ORAL_TABLET | Freq: Two times a day (BID) | ORAL | Status: DC
  Administered 2024-07-20 – 2024-07-21 (×2): 1250 mg via ORAL
  Filled 2024-07-20 (×2): qty 1

## 2024-07-20 MED ORDER — BUTALBITAL-APAP-CAFFEINE 50-325-40 MG PO TABS: 1.0000 | ORAL_TABLET | Freq: Four times a day (QID) | ORAL | Status: DC | PRN

## 2024-07-20 MED ORDER — ASPIRIN 81 MG PO CHEW
81.0000 mg | CHEWABLE_TABLET | Freq: Every day | ORAL | Status: DC
  Administered 2024-07-21 – 2024-07-25 (×5): 81 mg via ORAL
  Filled 2024-07-20 (×5): qty 1

## 2024-07-20 MED ORDER — TAMSULOSIN HCL 0.4 MG PO CAPS
0.4000 mg | ORAL_CAPSULE | Freq: Every day | ORAL | Status: DC
  Administered 2024-07-20 – 2024-08-04 (×16): 0.4 mg via ORAL
  Filled 2024-07-20 (×16): qty 1

## 2024-07-20 MED ORDER — ACETAMINOPHEN 325 MG PO TABS
650.0000 mg | ORAL_TABLET | ORAL | Status: DC | PRN
  Administered 2024-07-20: 650 mg via ORAL

## 2024-07-20 MED ORDER — ACETAMINOPHEN 325 MG PO TABS
650.0000 mg | ORAL_TABLET | ORAL | Status: DC | PRN
Start: 2024-07-20 — End: 2024-07-20

## 2024-07-20 MED ORDER — PANTOPRAZOLE SODIUM 40 MG PO TBEC
40.0000 mg | DELAYED_RELEASE_TABLET | Freq: Every day | ORAL | Status: DC
  Administered 2024-07-20 – 2024-08-04 (×16): 40 mg via ORAL
  Filled 2024-07-20 (×16): qty 1

## 2024-07-20 MED ORDER — IOHEXOL 350 MG/ML SOLN
75.0000 mL | Freq: Once | INTRAVENOUS | Status: AC | PRN
  Administered 2024-07-20: 75 mL via INTRAVENOUS

## 2024-07-20 MED ORDER — BUTALBITAL-APAP-CAFFEINE 50-325-40 MG PO TABS
1.0000 | ORAL_TABLET | Freq: Once | ORAL | Status: AC
  Administered 2024-07-20: 1 via ORAL
  Filled 2024-07-20: qty 1

## 2024-07-20 MED ORDER — HYDRALAZINE HCL 25 MG PO TABS
25.0000 mg | ORAL_TABLET | Freq: Three times a day (TID) | ORAL | Status: DC
  Administered 2024-07-20 – 2024-07-21 (×2): 25 mg via ORAL
  Filled 2024-07-20 (×2): qty 1

## 2024-07-20 NOTE — Care Management Important Message (Signed)
 Important Message  Patient Details  Name: Wayne Molina MRN: [id number] Date of Birth: [birth year]   Important Message Given:  Yes - [employer] IM     Claretta Deed 07/20/2024, 11:51 AM

## 2024-07-20 NOTE — Assessment & Plan Note (Signed)
 Normotensive at this time. Holding home Losartan .  - Hydralazine  50 mg every Q8H  - Coreg  6.25 mg BID

## 2024-07-20 NOTE — Plan of Care (Addendum)
 Notified by practice administrator for Dr. [name], neuro-oncology, that he will plan to see patient outpatient, as he is not taking inpatient consults at this time. Consult has been placed. Greatly appreciate their care.  Also saw patient at bedside after RN report of headache.  This is refractory to Tylenol .  Notably, patient is more somnolent than earlier in the morning.  Exam by Dr. Lennie with equal strength in the bilateral upper extremities though unfortunately able to fully assess extent of strength due to somnolence.  Patient is dysarthric which appears to have been consistent with previous days while in the hospital.  He is able to recite his name and mumbles answers to the rest of our questions that were difficult to understand.  We will proceed with stat CT head and CTA head and neck given his mental status has acutely worsened given he is not anticoagulated and has A-fib.  Notified by radiology that CTA head and neck with and without contrast would effectively come with a CT head so separate orders are not needed.

## 2024-07-20 NOTE — Care Management Important Message (Signed)
 Important Message  Patient Details  Name: Wayne Molina MRN: [id number] Date of Birth: [birth year]   Important Message Given:  Yes - Medicare IM     Claretta Deed 07/20/2024, 8:58 AM

## 2024-07-20 NOTE — Plan of Care (Signed)
  Problem: Activity: Goal: Ability to tolerate increased activity will improve Outcome: Progressing   Problem: Respiratory: Goal: Ability to maintain a clear airway and adequate ventilation will improve Outcome: Progressing   Problem: Role Relationship: Goal: Method of communication will improve Outcome: Progressing   Problem: Education: Goal: Knowledge of General Education information will improve Description: Including pain rating scale, medication(s)/side effects and non-pharmacologic comfort measures Outcome: Progressing   Problem: Health Behavior/Discharge Planning: Goal: Ability to manage health-related needs will improve Outcome: Progressing   Problem: Clinical Measurements: Goal: Ability to maintain clinical measurements within normal limits will improve Outcome: Progressing Goal: Will remain free from infection Outcome: Progressing Goal: Diagnostic test results will improve Outcome: Progressing Goal: Respiratory complications will improve Outcome: Progressing Goal: Cardiovascular complication will be avoided Outcome: Progressing   Problem: Activity: Goal: Risk for activity intolerance will decrease Outcome: Progressing   Problem: Nutrition: Goal: Adequate nutrition will be maintained Outcome: Progressing   Problem: Coping: Goal: Level of anxiety will decrease Outcome: Progressing   Problem: Elimination: Goal: Will not experience complications related to bowel motility Outcome: Progressing Goal: Will not experience complications related to urinary retention Outcome: Progressing   Problem: Pain Managment: Goal: General experience of comfort will improve and/or be controlled Outcome: Progressing   Problem: Safety: Goal: Ability to remain free from injury will improve Outcome: Progressing   Problem: Skin Integrity: Goal: Risk for impaired skin integrity will decrease Outcome: Progressing   Problem: Education: Goal: Ability to describe self-care  measures that may prevent or decrease complications (Diabetes Survival Skills Education) will improve Outcome: Progressing Goal: Individualized Educational Video(s) Outcome: Progressing   Problem: Coping: Goal: Ability to adjust to condition or change in health will improve Outcome: Progressing   Problem: Fluid Volume: Goal: Ability to maintain a balanced intake and output will improve Outcome: Progressing   Problem: Health Behavior/Discharge Planning: Goal: Ability to identify and utilize available resources and services will improve Outcome: Progressing Goal: Ability to manage health-related needs will improve Outcome: Progressing   Problem: Metabolic: Goal: Ability to maintain appropriate glucose levels will improve Outcome: Progressing   Problem: Nutritional: Goal: Maintenance of adequate nutrition will improve Outcome: Progressing Goal: Progress toward achieving an optimal weight will improve Outcome: Progressing   Problem: Skin Integrity: Goal: Risk for impaired skin integrity will decrease Outcome: Progressing   Problem: Tissue Perfusion: Goal: Adequacy of tissue perfusion will improve Outcome: Progressing   Problem: Education: Goal: Knowledge of the prescribed therapeutic regimen will improve Outcome: Progressing   Problem: Clinical Measurements: Goal: Usual level of consciousness will be regained or maintained. Outcome: Progressing Goal: Neurologic status will improve Outcome: Progressing Goal: Ability to maintain intracranial pressure will improve Outcome: Progressing   Problem: Skin Integrity: Goal: Demonstration of wound healing without infection will improve Outcome: Progressing

## 2024-07-20 NOTE — Assessment & Plan Note (Signed)
 HLD - continue home Atorvastatin  40mg  daily Afib - holding home Eliquis  5mg  BID, continue Coreg  6.25 mg BID for rate control. Restart ASA 81 mg today per neuro for bridge while off anticoagulation for 7 days s/p biopsy, likely will restart on 9/16

## 2024-07-20 NOTE — Progress Notes (Signed)
 Physical Therapy Treatment Patient Details Name: Wayne Molina MRN: [id number] DOB: [birth year] Today's Date: 07/20/2024   History of Present Illness Pt is a 68 y/o male presenting on 8/28 with L arm weakness, L hemianopsia, and slurred speech. 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 Course complicated by seizure episode. Intubated 8/29, extubated 9/4 but still with difficulty managing secretions.  Biopsy on 9/8. PMH includes: anxiety, HLD, HTN, sleep apnea, chronic afib.   PT Comments  Pt with increased lethargy in today's session with pt keeping eyes closed majority of session. Limited verbalizations with difficulty understanding speech. Pt was able to follow single step commands with increased time and repetitive cueing. Worked on seated balance and transfer training in today's session. Pt had a left lateral and anterior lean with ModA required to correct. Worked on crossing midline with L hand reaching for objects and targets. Performed partial sit<>stands with MaxAx2 and 2HH with pt unable to reach upright posture with limited knee and hip extension. Also worked on shoulder retraction and neck ROM to address forward flexed posture. Continue to recommend >3hrs post acute rehab with acute PT to follow.    Resting HR- 90s  Seated on EOB- 110s-120s    If plan is discharge home, recommend the following: Two people to help with walking and/or transfers;Two people to help with bathing/dressing/bathroom;Assistance with cooking/housework;Assistance with feeding;Direct supervision/assist for medications management;Assist for transportation;Direct supervision/assist for financial management;Supervision due to cognitive status   Can travel by private vehicle      No  Equipment Recommendations  Wheelchair (measurements PT);Wheelchair cushion (measurements PT);Hospital bed;Hoyer lift       Precautions / Restrictions  Precautions Precautions: Fall;Other (comment) Recall of Precautions/Restrictions: Impaired Precaution/Restrictions Comments: cortrak, watch vitals, SBP <160 Restrictions Weight Bearing Restrictions Per Provider Order: No     Mobility  Bed Mobility Overal bed mobility: Needs Assistance Bed Mobility: Rolling, Sidelying to Sit, Sit to Sidelying Rolling: Mod assist Sidelying to sit: Mod assist, +2 for physical assistance, +2 for safety/equipment    Sit to sidelying: Mod assist, +2 for physical assistance, +2 for safety/equipment General bed mobility comments: step by step cueing with increased time and effort. Assist needed to bring LE's off EOB and to raise trunk. Cues to lean onto L elbow when descending with assist for LE management and to control trunk    Transfers Overall transfer level: Needs assistance Equipment used: 2 person hand held assist Transfers: Sit to/from Stand Sit to Stand: +2 physical assistance, +2 safety/equipment, Max assist      General transfer comment: pt unable to reach upright posture with MaxAx2, 2HH and use of bed pad/gait belt to raise hips. Unable to fully extend legs or hips. x2 partial stands with assist to shift hips towards [facility] with each repetition    Ambulation/Gait    General Gait Details: unable this date    Modified Rankin (Stroke Patients Only) Modified Rankin (Stroke Patients Only) Pre-Morbid Rankin Score: No symptoms Modified Rankin: Severe disability     Balance Overall balance assessment: Needs assistance Sitting-balance support: Feet supported, No upper extremity supported Sitting balance-Leahy Scale: Poor Sitting balance - Comments: ModA for left lateral and anterior trunk lean. Assist to extend neck and retract shoulder blades due to forward flexed posture Postural control: Left lateral lean (anterior lean) Standing balance support: Bilateral upper extremity supported, Reliant on assistive device for balance, During functional  activity Standing balance-Leahy Scale: Zero  Standing balance comment: MaxAx2 for standing balanced, forward flexed posture         Communication Communication Communication: Impaired Factors Affecting Communication: Reduced clarity of speech  Cognition Arousal: Lethargic Behavior During Therapy: Flat affect   PT - Cognitive impairments: Difficult to assess Difficult to assess due to: Level of arousal, Impaired communication    PT - Cognition Comments: Lethargic during session with difficulty keeping eyes open. Only a few attempts to verbalize with difficulty understanding speech. Would follow commands with repetitive cues and increased time. Decreased L attention during session. Following commands: Impaired Following commands impaired: Follows one step commands with increased time, Follows multi-step commands inconsistently, Follows one step commands inconsistently    Cueing Cueing Techniques: Verbal cues, Tactile cues, Visual cues  Exercises General Exercises - Lower Extremity Long Arc Quad: AROM, Both, 5 reps, Seated Other Exercises Other Exercises: reaching across body for object and high-fives with L hand Other Exercises: bilateral neck rotation and extension AAROM        Pertinent Vitals/Pain Pain Assessment Pain Assessment: No/denies pain     PT Goals (current goals can now be found in the care plan section) Acute Rehab PT Goals PT Goal Formulation: With patient/family Time For Goal Achievement: 07/29/24 Potential to Achieve Goals: Fair Progress towards PT goals: Progressing toward goals    Frequency    Min 3X/week       AM-PAC PT 6 Clicks Mobility   Outcome Measure  Help needed turning from your back to your side while in a flat bed without using bedrails?: A Lot Help needed moving from lying on your back to sitting on the side of a flat bed without using bedrails?: Total Help needed moving to and from a bed to a chair (including a wheelchair)?:  Total Help needed standing up from a chair using your arms (e.g., wheelchair or bedside chair)?: Total Help needed to walk in hospital room?: Total Help needed climbing 3-5 steps with a railing? : Total 6 Click Score: 7    End of Session Equipment Utilized During Treatment: Gait belt Activity Tolerance: Patient limited by lethargy Patient left: in bed;with call bell/phone within reach;with bed alarm set;with family/visitor present Nurse Communication: Mobility status;Other (comment) (vitals, decreased alertness) PT Visit Diagnosis: Unsteadiness on feet (R26.81);Other abnormalities of gait and mobility (R26.89);Muscle weakness (generalized) (M62.81);Difficulty in walking, not elsewhere classified (R26.2);Other symptoms and signs involving the nervous system (R29.898)     Time: 8864-8797 PT Time Calculation (min) (ACUTE ONLY): 27 min  Charges:    $Therapeutic Exercise: 8-22 mins $Therapeutic Activity: 8-22 mins PT General Charges $$ ACUTE PT VISIT: 1 Visit                    Kate ORN, PT, DPT Secure Chat Preferred  Rehab Office [phone]    Kate BRAVO [name] 07/20/2024, 12:57 PM

## 2024-07-20 NOTE — Progress Notes (Addendum)
 Subjective: Unfortunately had sundowning again yesterday and was quite agitated.  Very sleepy this morning.  Did open his eyes and follow some commands for me.  Family at bedside  ROS: Unable to obtain due to poor mental status  Examination  Vital signs in last 24 hours: Temp:  [97.6 F (36.4 C)-99.1 F (37.3 C)] 99 F (37.2 C) (09/11 1135) Pulse Rate:  [78-99] 94 (09/11 1135) Resp:  [16-19] 17 (09/11 0813) BP: (92-158)/(55-95) 92/57 (09/11 1135) SpO2:  [93 %-99 %] 99 % (09/11 1135)  General: lying in bed, NAD Neuro: Opens eyes to repeated verbal stimulation, did not answer orientation questions, did follow some commands, PERRLA, antigravity strength in all 4 extremities  Basic Metabolic Panel: Recent Labs  Lab 07/15/24 0550 07/16/24 0412 07/18/24 0529 07/19/24 0739 07/20/24 0202  NA 141 139 137 136 135  K 3.9 4.1 4.3 4.4 4.7  CL 108 103 104 101 102  CO2 24 23 21* 23 22  GLUCOSE 131* 145* 230* 207* 108*  BUN 28* 27* 31* 29* 29*  CREATININE 0.79 0.76 0.81 0.71 0.79  CALCIUM  8.7* 9.2 8.9 8.8* 9.2  MG 2.2  --   --   --   --     CBC: Recent Labs  Lab 07/14/24 0009 07/15/24 0550 07/16/24 0412 07/17/24 0644 07/18/24 0529  WBC 8.3 10.3 11.8* 15.0* 18.7*  HGB 13.2 13.7 15.1 14.2 15.0  HCT 40.3 41.9 45.1 42.6 44.2  MCV 94.2 93.9 91.9 91.0 90.8  PLT 249 259 265 342 371     Coagulation Studies: No results for input(s): LABPROT, INR in the last 72 hours.  Imaging No new brain imaging overnight     ASSESSMENT AND PLAN:68 y.o. 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   - Bridging with aspirin81mg  in the meantime. -Of note, patient will need stat CT head and likely CTA head and neck as well for any acute worsening of  mental status as patient is currently not anticoagulated and has A-fib - Start Seroquel  12.5 mg at 6 PM to minimize delirium and sundowning -Will try to find out who will be patient's oncologist now that Dr. [name] is on leave -As needed Versed  for clinical seizures -Discussed plan with family at bedside and medicine team via secure chat     I personally spent a total of 35 minutes in the care of the patient today including getting/reviewing separately obtained history, performing a medically appropriate exam/evaluation, counseling and educating, placing orders, referring and communicating with other health care professionals, documenting clinical information in the EHR, independently interpreting results, and coordinating care.       Arlin [facility] For questions after 5pm please refer to AMION to reach the Neurologist on call

## 2024-07-20 NOTE — Progress Notes (Signed)
 SLP Cancellation Note  Patient Details Name: Wayne Molina MRN: [id number] DOB: 07/20/1956   Cancelled treatment:       Reason Eval/Treat Not Completed: Patient's level of consciousness Patient was very lethargic and per wife, he had another restless night. She indicated he only ate a few bites of food so far today. SLP will continue to follow.   Norleen IVAR Blase, MA, CCC-SLP Speech Therapy

## 2024-07-20 NOTE — Assessment & Plan Note (Signed)
 Smallbore feeding tube left nare. - Feeding supplement (Osmolite 1.5 Cal) liquid 50 mL/hr continuous until discontinued - Feeding supplement (Prosource TF 20) liquid 60 mL daily - Fiber supplement (Banatrol TF) liquid 60 mL BID - Mupirocin  ointment 2% twice daily - Protonix  40 mg daily

## 2024-07-20 NOTE — Plan of Care (Signed)
 FMTS Interim Progress Note  Patient assessed at bedside as part of night rounds with wife present.  Per day team patient has had ongoing headache with increased somnolence therefore pursuing workup with CT head and CTA head and neck is recommended by neurology given A-fib without anticoagulation currently.  Wife reports he has been more somnolent throughout the day with more garbled speech.  States he was previously more active and even difficult to settle trying to rip off lines and leads so this is a new change today.  Reports it has been unchanged since he was seen earlier.  Denies any further seizure activity.  States he did not eat a bite of food today and only had 1 sip of water , worse from previous days.  Wife also feels like his breathing has been more labored at times, does have a history of OSA but does not tolerate CPAP.  Patient resting throughout conversation, did not arouse to verbal stimuli.  Attempted to respond to questions, profoundly dysarthric speech unable to understand.  Was able to respond to yes/no questions with understanding.  Denies headache, chest pain and shortness of breath.  O: BP (!) 112/58 (BP Location: Right Arm)   Pulse 99   Temp 98.7 F (37.1 C) (Oral)   Resp 18   Ht 5' 9 (1.753 m)   Wt 74 kg   SpO2 97%   BMI 24.09 kg/m    General: Somnolent appearing, does arouse to voice, NAD CV: Irregular irregular rhythm.  Rate controlled.  No murmur. RESP: Some labored breaths intermittently, overall appears to have comfortable work of breathing on room air.  No wheezing or focal consolidation noted Neuro: Unable to answer orientation questions due to dysarthria.  Able to grip hands bilaterally.  Minimal movement of feet but some tensing noted when legs were lifted off the bed bilaterally.   A/P: Glioblastoma with secondary seizures First time meeting patient, did appear somnolent upon exam but per wife has been consistent throughout the day.  CT head stable with  prior, no new acute intracranial bleed.  Will follow-up with CTA head and neck as per neurology recommendations.  Consider spot EEG to rule out nonconvulsive status epilepticus given underlying new seizure disorder.  Will reach out to neurology if urgent acute changes overnight.  Thankfully wife plans to remain at bedside all night, will continue to monitor for acute neurologic changes. -CTA head neck -Consider spot EEG  Remainder of plan per day team.  Orders reviewed and updated as needed.  Theophilus Pagan, MD 07/20/2024, 10:00 PM PGY-3, [facility] Family Medicine Service pager [phone]

## 2024-07-20 NOTE — Progress Notes (Addendum)
 Daily Progress Note Intern Pager: [phone]  Patient name: Wayne Molina Medical record number: [id number] Date of birth: 1956/09/07 Age: 68 y.o. Gender: male  Primary Care Provider: Janna Ferrier, Molina Consultants: Neurosurgery, Neurology  Code Status: Full   Pt Overview and Major Events to Date:  8/29: Admitted, focal seizure in the ED, intubated and sent to the ICU 9/4: extubated 9/8: Neurosurgery completed open craniotomy for tumor biopsy   Medical Decision Making:  Wayne Molina is a 68 year old male with a PMH of HTN, HLD, A-fib not currently on anticoagulation, OSA, and osteoporosis who presented with L sided neurodeficits and found to have a brain tumor.  S/p open craniotomy and tumor biopsy on 9/8, surgical pathology reveals Grade IV glioma.  Discussed results with neurology and neurosurgery, whom have consulted neuro-oncology.   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 Afebrile. Patient in mitts this morning with family present at bedside. More tired this morning than yesterday. Got just a little bit of sleep yesterday per brother and wife.  - Neurology consulted, recs appreciated  - Restarted ASA 81 mg today after discussing with NSGY  - Will restart Eliquis  on 9/16  - If any acute change in mental status since pt isn't anticoagulated, recommend CT head w/o contrast and CTA head and neck to r/o bleed/stroke  - PRN Versed  for clinical seizures - Neurosurgery consulted, appreciate recommendations  - Dr. Rosslyn discussed tumor results with patient at bedside with Dr. Janna and I - Per neurosurgery recommendations, holding off on palliative consult for now, until family can talk with neuro-oncology - Keppra  1,250 mg BID - Dexamethasone  4 mg IV Q8H - D/C'd Bethanechol  5 mg TID  - Flomax  0.4 mg currently also has a urinary catheter, will attempt void trial in 3 days  - Pain regimen: Fioricet  1 tablet Q6H PRN - Lantus  5  units QHS and SSI w/ steroids to maintain glucose 140-180 - Nausea regimen: Zofran  4 mg PRN - Delirium: Melatonin 3 mg at 6pm and Seroquel  12.5 mg at 6 pm to minimize delirum/sundowing  - Continuous cardiac monitoring - Neuro checks Q4H - PT/OT/SLP consulted, appreciate recommendations - Fall and seizure precautions - AM Labs: BMP with GFR Hypertension Normotensive at this time. Holding home Losartan .  - Hydralazine  50 mg every Q8H  - Coreg  6.25 mg BID  Malnutrition of moderate degree Smallbore feeding tube left nare. - Feeding supplement (Osmolite 1.5 Cal) liquid 50 mL/hr continuous until discontinued - Feeding supplement (Prosource TF 20) liquid 60 mL daily - Fiber supplement (Banatrol TF) liquid 60 mL BID - Mupirocin  ointment 2% twice daily - Protonix  40 mg daily Chronic health problem HLD - continue home Atorvastatin  40mg  daily Afib - holding home Eliquis  5mg  BID, continue Coreg  6.25 mg BID for rate control. Restart ASA 81 mg today per neuro for bridge while off anticoagulation for 7 days s/p biopsy, likely will restart on 9/16   FEN/GI: Dysphagia 3  PPx: SCDs Dispo: Home vs. CIR pending evaluation and clinical improvement.   Subjective:  Patient seen at bedside with wife, brother, another family/friend member present.  Dr. Delores was in the room discussing with family diagnosis and specialist that would be involved in patient's care.  We discussed that specialist would have better insight into prognosis, treatment options.  Patient expressed feeling a little anxious during this discussion.  He denied any pain or shortness of breath at this time.  Objective: Temp:  [97.6 F (  36.4 C)-99.1 F (37.3 C)] 99 F (37.2 C) (09/11 1135) Pulse Rate:  [78-99] 94 (09/11 1135) Resp:  [16-19] 17 (09/11 0813) BP: (92-158)/(55-95) 92/57 (09/11 1135) SpO2:  [93 %-99 %] 99 % (09/11 1135) Physical Exam: General: Ill-appearing male, NAD Cardiovascular: RRR, no M/R/G Respiratory: CTAB  anteriorly, normal work of breathing Abdomen:, Nondistended, nontender to palpation Extremities: SCDs in place, mitts on hands bilaterally  Laboratory: Most recent CBC Lab Results  Component Value Date   WBC 18.7 (H) 07/18/2024   HGB 15.0 07/18/2024   HCT 44.2 07/18/2024   MCV 90.8 07/18/2024   PLT 371 07/18/2024   Most recent BMP    Latest Ref Rng & Units 07/20/2024    2:02 AM  BMP  Glucose 70 - 99 mg/dL 891   BUN 8 - 23 mg/dL 29   Creatinine 9.38 - 1.24 mg/dL 9.20   Sodium 864 - 854 mmol/L 135   Potassium 3.5 - 5.1 mmol/L 4.7   Chloride 98 - 111 mmol/L 102   CO2 22 - 32 mmol/L 22   Calcium  8.9 - 10.3 mg/dL 9.2     Imaging/Diagnostic Tests: No new imaging.  Wayne Raguel MATSU, Molina 07/20/2024, 2:27 PM  PGY-1, [facility] Family Medicine FPTS Intern pager: [phone], text pages welcome Secure chat group [facility] Teaching Service

## 2024-07-20 NOTE — Assessment & Plan Note (Addendum)
 Afebrile. Patient in mitts this morning with family present at bedside. More tired this morning than yesterday. Got just a little bit of sleep yesterday per brother and wife.  - Neurology consulted, recs appreciated  - Restarted ASA 81 mg today after discussing with NSGY  - Will restart Eliquis  on 9/16  - If any acute change in mental status since pt isn't anticoagulated, recommend CT head w/o contrast and CTA head and neck to r/o bleed/stroke  - PRN Versed  for clinical seizures - Neurosurgery consulted, appreciate recommendations  - Dr. Rosslyn discussed tumor results with patient at bedside with Dr. Janna and I - Per neurosurgery recommendations, holding off on palliative consult for now, until family can talk with neuro-oncology - Keppra  1,250 mg BID - Dexamethasone  4 mg IV Q8H - D/C'd Bethanechol  5 mg TID  - Flomax  0.4 mg currently also has a urinary catheter, will attempt void trial in 3 days  - Pain regimen: Fioricet  1 tablet Q6H PRN - Lantus  5 units QHS and SSI w/ steroids to maintain glucose 140-180 - Nausea regimen: Zofran  4 mg PRN - Delirium: Melatonin 3 mg at 6pm and Seroquel  12.5 mg at 6 pm to minimize delirum/sundowing  - Continuous cardiac monitoring - Neuro checks Q4H - PT/OT/SLP consulted, appreciate recommendations - Fall and seizure precautions - AM Labs: BMP with GFR

## 2024-07-21 ENCOUNTER — Inpatient Hospital Stay (HOSPITAL_COMMUNITY)

## 2024-07-21 ENCOUNTER — Telehealth: Payer: Self-pay | Admitting: Radiation Oncology

## 2024-07-21 DIAGNOSIS — A419 Sepsis, unspecified organism: Secondary | ICD-10-CM

## 2024-07-21 DIAGNOSIS — C719 Malignant neoplasm of brain, unspecified: Secondary | ICD-10-CM | POA: Diagnosis not present

## 2024-07-21 DIAGNOSIS — R509 Fever, unspecified: Secondary | ICD-10-CM | POA: Diagnosis not present

## 2024-07-21 DIAGNOSIS — R6520 Severe sepsis without septic shock: Secondary | ICD-10-CM

## 2024-07-21 DIAGNOSIS — R569 Unspecified convulsions: Secondary | ICD-10-CM | POA: Diagnosis not present

## 2024-07-21 DIAGNOSIS — D497 Neoplasm of unspecified behavior of endocrine glands and other parts of nervous system: Secondary | ICD-10-CM | POA: Diagnosis not present

## 2024-07-21 DIAGNOSIS — G9340 Encephalopathy, unspecified: Secondary | ICD-10-CM | POA: Diagnosis not present

## 2024-07-21 DIAGNOSIS — I4891 Unspecified atrial fibrillation: Secondary | ICD-10-CM | POA: Diagnosis not present

## 2024-07-21 LAB — CBC WITH DIFFERENTIAL/PLATELET
Basophils Absolute: 0 K/uL (ref 0.0–0.1)
Basophils Relative: 0 %
Eosinophils Absolute: 0 K/uL (ref 0.0–0.5)
Eosinophils Relative: 0 %
HCT: 39.8 % (ref 39.0–52.0)
Hemoglobin: 13.5 g/dL (ref 13.0–17.0)
Lymphocytes Relative: 0 %
Lymphs Abs: 0 K/uL — ABNORMAL LOW (ref 0.7–4.0)
MCH: 30.9 pg (ref 26.0–34.0)
MCHC: 33.9 g/dL (ref 30.0–36.0)
MCV: 91.1 fL (ref 80.0–100.0)
Monocytes Absolute: 1.5 K/uL — ABNORMAL HIGH (ref 0.1–1.0)
Monocytes Relative: 3 %
Neutro Abs: 47.9 K/uL — ABNORMAL HIGH (ref 1.7–7.7)
Neutrophils Relative %: 97 %
Platelets: 372 K/uL (ref 150–400)
RBC: 4.37 MIL/uL (ref 4.22–5.81)
RDW: 12.4 % (ref 11.5–15.5)
WBC: 49.4 K/uL — ABNORMAL HIGH (ref 4.0–10.5)
nRBC: 0 % (ref 0.0–0.2)

## 2024-07-21 LAB — BASIC METABOLIC PANEL WITH GFR
Anion gap: 9 (ref 5–15)
BUN: 28 mg/dL — ABNORMAL HIGH (ref 8–23)
CO2: 21 mmol/L — ABNORMAL LOW (ref 22–32)
Calcium: 8.5 mg/dL — ABNORMAL LOW (ref 8.9–10.3)
Chloride: 102 mmol/L (ref 98–111)
Creatinine, Ser: 0.71 mg/dL (ref 0.61–1.24)
GFR, Estimated: >60 mL/min (ref >60)
Glucose, Bld: 184 mg/dL — ABNORMAL HIGH (ref 70–99)
Potassium: 4.6 mmol/L (ref 3.5–5.1)
Sodium: 132 mmol/L — ABNORMAL LOW (ref 135–145)

## 2024-07-21 LAB — GLUCOSE, CAPILLARY
Glucose-Capillary: 202 mg/dL — ABNORMAL HIGH (ref 70–99)
Glucose-Capillary: 212 mg/dL — ABNORMAL HIGH (ref 70–99)
Glucose-Capillary: 222 mg/dL — ABNORMAL HIGH (ref 70–99)
Glucose-Capillary: 179 mg/dL — ABNORMAL HIGH (ref 70–99)
Glucose-Capillary: 262 mg/dL — ABNORMAL HIGH (ref 70–99)
Glucose-Capillary: 204 mg/dL — ABNORMAL HIGH (ref 70–99)

## 2024-07-21 LAB — RESP PANEL BY RT-PCR (RSV, FLU A&B, COVID)  RVPGX2
Influenza A by PCR: NEGATIVE
Influenza B by PCR: NEGATIVE
Resp Syncytial Virus by PCR: NEGATIVE
SARS Coronavirus 2 by RT PCR: NEGATIVE

## 2024-07-21 MED ORDER — VANCOMYCIN HCL 1500 MG/300ML IV SOLN
1500.0000 mg | Freq: Once | INTRAVENOUS | Status: AC
Start: 2024-07-21 — End: 2024-07-22
  Administered 2024-07-21: 1500 mg via INTRAVENOUS
  Filled 2024-07-21: qty 300

## 2024-07-21 MED ORDER — METOPROLOL TARTRATE 25 MG PO TABS
25.0000 mg | ORAL_TABLET | Freq: Once | ORAL | Status: AC
  Administered 2024-07-21: 25 mg via ORAL
  Filled 2024-07-21: qty 1

## 2024-07-21 MED ORDER — VANCOMYCIN HCL IN DEXTROSE 1-5 GM/200ML-% IV SOLN
1000.0000 mg | Freq: Two times a day (BID) | INTRAVENOUS | Status: DC
  Administered 2024-07-21 – 2024-07-22 (×3): 1000 mg via INTRAVENOUS
  Filled 2024-07-21 (×5): qty 200

## 2024-07-21 MED ORDER — LEVETIRACETAM 500 MG PO TABS
1000.0000 mg | ORAL_TABLET | Freq: Two times a day (BID) | ORAL | Status: DC
  Administered 2024-07-21 – 2024-08-04 (×28): 1000 mg via ORAL
  Filled 2024-07-21 (×28): qty 2

## 2024-07-21 MED ORDER — INSULIN GLARGINE 100 UNIT/ML ~~LOC~~ SOLN
8.0000 [IU] | Freq: Every day | SUBCUTANEOUS | Status: DC
  Administered 2024-07-21 – 2024-07-26 (×6): 8 [IU] via SUBCUTANEOUS
  Filled 2024-07-21 (×8): qty 0.08

## 2024-07-21 MED ORDER — ACETAMINOPHEN 325 MG PO TABS
650.0000 mg | ORAL_TABLET | Freq: Four times a day (QID) | ORAL | Status: DC | PRN
  Administered 2024-07-21 – 2024-07-23 (×5): 650 mg via ORAL
  Filled 2024-07-21 (×6): qty 2

## 2024-07-21 MED ORDER — PIPERACILLIN-TAZOBACTAM 3.375 G IVPB 30 MIN
3.3750 g | Freq: Once | INTRAVENOUS | Status: AC
  Administered 2024-07-21: 3.375 g via INTRAVENOUS
  Filled 2024-07-21: qty 50

## 2024-07-21 MED ORDER — ACETAMINOPHEN 10 MG/ML IV SOLN
1000.0000 mg | Freq: Once | INTRAVENOUS | Status: AC
  Administered 2024-07-21: 1000 mg via INTRAVENOUS
  Filled 2024-07-21: qty 100

## 2024-07-21 MED ORDER — CHLORHEXIDINE GLUCONATE CLOTH 2 % EX PADS
6.0000 | MEDICATED_PAD | Freq: Every day | CUTANEOUS | Status: DC
  Administered 2024-07-21 – 2024-08-04 (×13): 6 via TOPICAL

## 2024-07-21 MED ORDER — PIPERACILLIN-TAZOBACTAM 3.375 G IVPB
3.3750 g | Freq: Three times a day (TID) | INTRAVENOUS | Status: DC
  Administered 2024-07-21 – 2024-07-25 (×10): 3.375 g via INTRAVENOUS
  Filled 2024-07-21 (×13): qty 50

## 2024-07-21 MED ORDER — QUETIAPINE FUMARATE 25 MG PO TABS
12.5000 mg | ORAL_TABLET | Freq: Once | ORAL | Status: AC
Start: 2024-07-21 — End: 2024-07-21
  Administered 2024-07-21: 12.5 mg via ORAL
  Filled 2024-07-21: qty 1

## 2024-07-21 MED ORDER — CARVEDILOL 12.5 MG PO TABS
12.5000 mg | ORAL_TABLET | Freq: Two times a day (BID) | ORAL | Status: DC
  Administered 2024-07-21 – 2024-08-04 (×28): 12.5 mg via ORAL
  Filled 2024-07-21 (×28): qty 1

## 2024-07-21 NOTE — Assessment & Plan Note (Addendum)
 Febrile overnight to 101.2. Rhonchi heard on exam today.  - Chest x-ray showed no acute cardiopulmonary abnormality - Covid/flu testing - Vancomycin  and Zosyn  per pharmacy

## 2024-07-21 NOTE — Plan of Care (Signed)
  Problem: Respiratory: Goal: Ability to maintain a clear airway and adequate ventilation will improve Outcome: Progressing   Problem: Health Behavior/Discharge Planning: Goal: Ability to manage health-related needs will improve Outcome: Progressing   Problem: Clinical Measurements: Goal: Will remain free from infection Outcome: Progressing   Problem: Clinical Measurements: Goal: Respiratory complications will improve Outcome: Progressing   Problem: Clinical Measurements: Goal: Cardiovascular complication will be avoided Outcome: Progressing   Problem: Activity: Goal: Risk for activity intolerance will decrease Outcome: Progressing   Problem: Nutrition: Goal: Adequate nutrition will be maintained Outcome: Progressing   Problem: Elimination: Goal: Will not experience complications related to bowel motility Outcome: Progressing   Problem: Elimination: Goal: Will not experience complications related to urinary retention Outcome: Progressing   Problem: Pain Managment: Goal: General experience of comfort will improve and/or be controlled Outcome: Progressing   Problem: Safety: Goal: Ability to remain free from injury will improve Outcome: Progressing   Problem: Skin Integrity: Goal: Risk for impaired skin integrity will decrease Outcome: Progressing

## 2024-07-21 NOTE — Progress Notes (Addendum)
 Daily Progress Note Intern Pager: [phone]  Patient name: Wayne Molina Medical record number: [id number] Date of birth: 08/29/1956 Age: 68 y.o. Gender: male  Primary Care Provider: Janna Ferrier, DO Consultants: Neurosurgery, Neurology  Code Status: Full  Pt Overview and Major Events to Date:  8/29: Admitted, focal seizure in the ED, intubated and sent to the ICU 9/4: extubated 9/8: Neurosurgery completed open craniotomy for tumor biopsy   Medical Decision Making:  [name] is a 68 y.o. male with a PMH of HTN, HLD, A-fib, not currently on anticoagulation, OSA, and osteoporosis who presented with L sided neuro deficits and found to have a brain tumor biopsy on 9/8, surgical pathology reveals Grade IV glioma. Discussed results with neurology and neurosurgery, who have both consulted neuro-oncology.  Febrile overnight with cough this morning, started on vancomycin  and Zosyn . 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 Dr. Delores discussed palliative care with patient and wife this morning.  They were amenable to this consult and consult was placed.  Lisa Rouson, inpatient med-onc NP who works with Dr. [name], is coming to see the patient today. - Neurology consulted, recs appreciated  - EEG to assess for seizure activity  - Restarted ASA 81 mg after discussing with NSGY  - Will restart Eliquis  on 9/16 - If any acute change in mental status since pt isn't anticoagulated, recommend CT head w/o contrast and CTA head and neck to r/o bleed/stroke  - PRN Versed  for clinical seizures - Neurosurgery consulted, appreciate recommendations  -Palliative care consulted, appreciate assistance in patient's care - Keppra  1,250 mg BID - Dexamethasone  4 mg IV Q8H - Flomax  0.4 mg currently also has a urinary catheter, will attempt void trial in 3 days  - Pain regimen: Fioricet  1 tablet Q6H PRN - Increased Lantus  to 8 units QHS and SSI w/  steroids to maintain glucose 140-180 - Nausea regimen: Zofran  4 mg PRN - Delirium: Melatonin 3 mg at 6pm and Seroquel  12.5 mg at 6 pm to minimize delirum/sundowing  - Continuous cardiac monitoring - Neuro checks Q4H - PT/OT/SLP consulted, appreciate recommendations - Fall and seizure precautions - AM Labs: BMP with GFR, CBC Fever Febrile overnight to 101.2. Rhonchi heard on exam today.  - Chest x-ray showed no acute cardiopulmonary abnormality - Covid/flu testing - Vancomycin  and Zosyn  per pharmacy  Atrial fibrillation with RVR (HCC) A-fib with RVR overnight. RVR resolved by time night team saw patient. Rate 87 this morning. Currently holding patient's Eliquis  s/p R craniotomy with tumor biopsy on 9/8. - ASA 81 mg per neuro for bridge while off anticoagulation for 7 days s/p biopsy, likely will restart Eliquis  on 9/16. - Will increase Coreg  to 12.5 twice daily today  - EKG: A-fib, ST segment changes, similar to prior earlier this morning Hypertension Normotensive at this time. Holding home Losartan . Discontinued hydralazine  9/12. Systolic <160.  - Coreg  12.5  mg BID  Malnutrition of moderate degree Smallbore feeding tube left nare. - Feeding supplement (Osmolite 1.5 Cal) liquid 50 mL/hr continuous until discontinued - Feeding supplement (Prosource TF 20) liquid 60 mL daily - Fiber supplement (Banatrol TF) liquid 60 mL BID - Mupirocin  ointment 2% twice daily - Protonix  40 mg daily Chronic health problem HLD - continue home Atorvastatin  40mg  daily     FEN/GI: Dysphagia 3  PPx: SCDs Dispo: Home vs. CIR pending evaluation and clinical improvement.   Subjective:  Patient was seen at bedside.  He responds to  voice.  Patient is coughing a lot states he feels it in his chest.  Per wife this has been going on since before sunrise this morning.  She states he also seems to be quite hot and continually kicks the covers off.  Objective: Temp:  [97.5 F (36.4 C)-101.2 F (38.4 C)] 99.1  F (37.3 C) (09/12 0357) Pulse Rate:  [88-99] 91 (09/12 0357) Resp:  [17-20] 20 (09/12 0357) BP: (92-128)/(55-72) 119/72 (09/12 0357) SpO2:  [95 %-99 %] 95 % (09/12 0357) Weight:  [72.6 kg] 72.6 kg (09/12 0500) Physical Exam: General: ill-appearing male, labored breathing Cardiovascular: RRR, no m/r/g Respiratory: rhonchi bilateral anteriorly, increased work of breathing Abdomen: soft, non-distended, no rebound, no guarding  Extremities: ted hose and SCDs in place   Laboratory: Most recent CBC Lab Results  Component Value Date   WBC 18.7 (H) 07/18/2024   HGB 15.0 07/18/2024   HCT 44.2 07/18/2024   MCV 90.8 07/18/2024   PLT 371 07/18/2024   Most recent BMP    Latest Ref Rng & Units 07/21/2024    2:40 AM  BMP  Glucose 70 - 99 mg/dL 815   BUN 8 - 23 mg/dL 28   Creatinine 9.38 - 1.24 mg/dL 9.28   Sodium 864 - 854 mmol/L 132   Potassium 3.5 - 5.1 mmol/L 4.6   Chloride 98 - 111 mmol/L 102   CO2 22 - 32 mmol/L 21   Calcium  8.9 - 10.3 mg/dL 8.5     Imaging/Diagnostic Tests: CT Head  1. Postoperative changes from prior right temporal craniotomy for tumor biopsy. Previously identified infiltrating mass within the right cerebral hemisphere is grossly stable. Focus of hemorrhage within the mass itself has slightly contracted in the interim, now measuring 1.6 x 1.1 x 1.3 cm, previously 1.9 x 1.2 x 1.5 cm. Trace residual hemorrhage along the biopsy tract has decreased as well. Similar surrounding vasogenic edema and regional mass effect without significant midline shift. 2. No other new acute intracranial abnormality.  CT Angio Head/Neck  1. Stable CTA of the head and neck. No large vessel occlusion or other emergent finding. 2. 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 Left V4 segment reoccludes beyond the takeoff of the left PICA. 3. Atheromatous plaque at the right ICA  terminus/proximal right M1 segment with short-segment mild-to-moderate stenosis. 4. Atheromatous plaque at the left carotid bulb with estimated 50% stenosis, stable. 5. Right cerebral mass with superimposed hemorrhage, not significantly changed from CT performed earlier the same day.  Chest x-ray Radiology impression: No acute cardiopulmonary abnormality My impression: No focal consolidation, no pleural effusions  [name], DO 07/21/2024, 7:29 AM  PGY-1, [facility] Family Medicine FPTS Intern pager: [phone], text pages welcome Secure chat group [facility] Teaching Service

## 2024-07-21 NOTE — Progress Notes (Signed)
 Pt complaint of headache,  is restless, removing leads, gowns, also tachycardic 115-120's and one HR of 170 but not sustaining  Placed hand mittens on, also informed MD Nygaard, ordered EKG and meds were given.

## 2024-07-21 NOTE — Plan of Care (Signed)
 FMTS Interim Progress Note  Patient reassessed this a.m. at bedside with wife present.  Patient restless in the bed and stating that he feels hot.  Wife states he has been more awake this morning and even pulled off one of his safety mittens.  States he continues to have the deep cough but continues to have difficulty bringing up sputum.  Patient denies any headache, chest pain, shortness of breath or abdominal pain currently.  Patient febrile overnight 101.2, possibly in the setting of infection versus postictal fever.  Given intermittent labored breathing with wet cough, respiratory infection most likely etiology although patient previously treated for HAP (not currently on antibiotics). Will initiate workup but defer further antibiotic management to day team. - CXR - Blood cultures - Consider Foley change and obtaining urine sample - Spot EEG  Theophilus Pagan, MD 07/21/2024, 6:46 AM PGY-3, [facility] Health Family Medicine Service pager [phone]

## 2024-07-21 NOTE — Progress Notes (Addendum)
 Subjective: Had Afib  overnight.  Also febrile with Tmax 101.2 Fahrenheit.  Wife at bedside  ROS: Unable to obtain due to poor mental status  Examination  Vital signs in last 24 hours: Temp:  [97.5 F (36.4 C)-101.2 F (38.4 C)] 97.7 F (36.5 C) (09/12 0818) Pulse Rate:  [87-99] 87 (09/12 0818) Resp:  [18-20] 20 (09/12 0818) BP: (92-128)/(57-72) 119/71 (09/12 0818) SpO2:  [94 %-99 %] 94 % (09/12 0818) Weight:  [72.6 kg] 72.6 kg (09/12 0500)  General: Sitting in bed, appears to be coughing a lot Neuro: Opens his eyes to verbal stimulation, able to tell me his name and that he is at a hospital, was following simple commands, appears to have left-sided visual neglect, antigravity strength in all 4 extremities  Basic Metabolic Panel: Recent Labs  Lab 07/15/24 0550 07/16/24 0412 07/18/24 0529 07/19/24 0739 07/20/24 0202 07/21/24 0240  NA 141 139 137 136 135 132*  K 3.9 4.1 4.3 4.4 4.7 4.6  CL 108 103 104 101 102 102  CO2 24 23 21* 23 22 21*  GLUCOSE 131* 145* 230* 207* 108* 184*  BUN 28* 27* 31* 29* 29* 28*  CREATININE 0.79 0.76 0.81 0.71 0.79 0.71  CALCIUM  8.7* 9.2 8.9 8.8* 9.2 8.5*  MG 2.2  --   --   --   --   --     CBC: Recent Labs  Lab 07/15/24 0550 07/16/24 0412 07/17/24 0644 07/18/24 0529  WBC 10.3 11.8* 15.0* 18.7*  HGB 13.7 15.1 14.2 15.0  HCT 41.9 45.1 42.6 44.2  MCV 93.9 91.9 91.0 90.8  PLT 259 265 342 371     Coagulation Studies: No results for input(s): LABPROT, INR in the last 72 hours.  Imaging personally reviewed   CT head without contrast 07/20/2024: 1. Postoperative changes from prior right temporal craniotomy for tumor biopsy. Previously identified infiltrating mass within the right cerebral hemisphere is grossly stable. Focus of hemorrhage within the mass itself has slightly contracted in the interim, now measuring 1.6 x 1.1 x 1.3 cm, previously 1.9 x 1.2 x 1.5 cm. Trace residual hemorrhage along the biopsy tract has decreased as  well. Similar surrounding vasogenic edema and regional mass effect without significant midline shift. 2. No other new acute intracranial abnormality.  CTA head and neck with and without contrast 07/21/2024: 1. Stable CTA of the head and neck. No large vessel occlusion or other emergent finding. 2. 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 Left V4 segment reoccludes beyond the takeoff of the left PICA. 3. Atheromatous plaque at the right ICA terminus/proximal right M1 segment with short-segment mild-to-moderate stenosis. 4. Atheromatous plaque at the left carotid bulb with estimated 50% stenosis, stable. 5. Right cerebral mass with superimposed hemorrhage, not significantly changed from CT performed earlier the same day.    ASSESSMENT AND PLAN:68 y.o. 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    Suspected primary CNS tumor New onset seizures - Continue Keppra  1250 mg twice daily -Will obtain video EEG to confirm seizures have not recurred - Will need to be off anticoagulation for about 7 days after biopsy ( 9/16).   - Bridging with aspirin81mg  in the meantime. -Of note, patient will need stat CT head and likely CTA head and neck as well for any acute worsening of mental status as patient is  currently not anticoagulated and has A-fib - Continue Seroquel  12.5 mg at 6 PM to minimize delirium and sundowning -Oncology team planning to see patient today -As needed Versed  for clinical seizures -Discussed plan with wife at bedside and medicine team via secure chat -Management of fever per primary team  Addendum - No sz on eeg. Will reduce keppra  to 1000mg  BID to minimze sedation     I personally spent a total of 36 minutes in the care of the patient today including getting/reviewing separately obtained history,  performing a medically appropriate exam/evaluation, counseling and educating, placing orders, referring and communicating with other health care professionals, documenting clinical information in the EHR, independently interpreting results, and coordinating care.          Arlin [facility] For questions after 5pm please refer to AMION to reach the Neurologist on call

## 2024-07-21 NOTE — Progress Notes (Signed)
 Pharmacy Antibiotic Note  Wayne Molina is a 68 y.o. male admitted on 07/06/2024 with L sided neuro deficits and found to have a brain tumor biopsy on 9/8, surgical pathology with grade IV glioma. Patient became febrile overnight with increased cough. Pharmacy has been consulted for vancomycin  and Zosyn  dosing.  Plan: Vancomycin  1500 mg IV x1, followed by vancomycin  1000 mg IV Q12h (eAUC 505, goal AUC 400-550, Scr 0.8) Zosyn  3.375g IV Q8h   Trend WBC, fever, renal function F/u cultures, clinical progress, levels as indicated De-escalate when able   Height: 5' 9 (175.3 cm) Weight: 72.6 kg (160 lb 0.9 oz) IBW/kg (Calculated) : 70.7  Temp (24hrs), Avg:98.8 F (37.1 C), Min:97.5 F (36.4 C), Max:101.2 F (38.4 C)  Recent Labs  Lab 07/15/24 0550 07/16/24 0412 07/17/24 0644 07/18/24 0529 07/19/24 0739 07/20/24 0202 07/21/24 0240 07/21/24 1043  WBC 10.3 11.8* 15.0* 18.7*  --   --   --  49.4*  CREATININE 0.79 0.76  --  0.81 0.71 0.79 0.71  --     Estimated Creatinine Clearance: 85.9 mL/min (by C-G formula based on SCr of 0.71 mg/dL).    No Known Allergies  Microbiology results: 9/12 BCx: pending 8/31 TA: MSSA, H flu  Thank you for allowing pharmacy to be a part of this patient's care.  Shelba Collier, PharmD, BCPS Clinical Pharmacist

## 2024-07-21 NOTE — Plan of Care (Addendum)
 Received MyChart message from around 1 AM nursing that patient had a headache and ordered 1000 mg Tylenol , as well as telemetry event of A-fib RVR to 170s, when she checked he was ranging 115-120 sustained for about an hour.  I was then contacted.  Ordered EKG which found irregular rhythms and rate of 96.  Questionable retrograde P waves with intermittent flutter appearance but with irregularity most likely A-fib.  Ordered metoprolol  25 mg p.o. and repeat EKG scheduled for 6 AM which was unchanged with rate in 80s.

## 2024-07-21 NOTE — Plan of Care (Signed)
 Patient ID: Wayne Molina, male   DOB: [birth year], 68 y.o.   MRN: [id number]     Problem: Activity: Goal: Ability to tolerate increased activity will improve Outcome: Progressing   Problem: Respiratory: Goal: Ability to maintain a clear airway and adequate ventilation will improve Outcome: Progressing   Problem: Role Relationship: Goal: Method of communication will improve Outcome: Progressing   Problem: Education: Goal: Knowledge of General Education information will improve Description: Including pain rating scale, medication(s)/side effects and non-pharmacologic comfort measures Outcome: Progressing   Problem: Health Behavior/Discharge Planning: Goal: Ability to manage health-related needs will improve Outcome: Progressing   Problem: Clinical Measurements: Goal: Ability to maintain clinical measurements within normal limits will improve Outcome: Progressing Goal: Will remain free from infection Outcome: Progressing Goal: Diagnostic test results will improve Outcome: Progressing Goal: Respiratory complications will improve Outcome: Progressing Goal: Cardiovascular complication will be avoided Outcome: Progressing   Problem: Activity: Goal: Risk for activity intolerance will decrease Outcome: Progressing   Problem: Nutrition: Goal: Adequate nutrition will be maintained Outcome: Progressing   Problem: Coping: Goal: Level of anxiety will decrease Outcome: Progressing   Problem: Elimination: Goal: Will not experience complications related to bowel motility Outcome: Progressing Goal: Will not experience complications related to urinary retention Outcome: Progressing   Problem: Pain Managment: Goal: General experience of comfort will improve and/or be controlled Outcome: Progressing   Problem: Safety: Goal: Ability to remain free from injury will improve Outcome: Progressing   Problem: Skin Integrity: Goal: Risk for impaired skin integrity will  decrease Outcome: Progressing   Problem: Education: Goal: Ability to describe self-care measures that may prevent or decrease complications (Diabetes Survival Skills Education) will improve Outcome: Progressing Goal: Individualized Educational Video(s) Outcome: Progressing   Problem: Coping: Goal: Ability to adjust to condition or change in health will improve Outcome: Progressing   Problem: Fluid Volume: Goal: Ability to maintain a balanced intake and output will improve Outcome: Progressing   Problem: Health Behavior/Discharge Planning: Goal: Ability to identify and utilize available resources and services will improve Outcome: Progressing Goal: Ability to manage health-related needs will improve Outcome: Progressing   Problem: Metabolic: Goal: Ability to maintain appropriate glucose levels will improve Outcome: Progressing   Problem: Nutritional: Goal: Maintenance of adequate nutrition will improve Outcome: Progressing Goal: Progress toward achieving an optimal weight will improve Outcome: Progressing   Problem: Skin Integrity: Goal: Risk for impaired skin integrity will decrease Outcome: Progressing   Problem: Tissue Perfusion: Goal: Adequacy of tissue perfusion will improve Outcome: Progressing   Problem: Education: Goal: Knowledge of the prescribed therapeutic regimen will improve Outcome: Progressing   Problem: Clinical Measurements: Goal: Usual level of consciousness will be regained or maintained. Outcome: Progressing Goal: Neurologic status will improve Outcome: Progressing Goal: Ability to maintain intracranial pressure will improve Outcome: Progressing   Problem: Skin Integrity: Goal: Demonstration of wound healing without infection will improve Outcome: Progressing  Wayne JONETTA Collier, RN

## 2024-07-21 NOTE — Assessment & Plan Note (Addendum)
 Normotensive at this time. Holding home Losartan . Discontinued hydralazine  9/12. Systolic <160.  - Coreg  12.5  mg BID

## 2024-07-21 NOTE — Assessment & Plan Note (Addendum)
 Dr. Delores discussed palliative care with patient and wife this morning.  They were amenable to this consult and consult was placed.  Lisa Rouson, inpatient med-onc NP who works with Dr. [name], is coming to see the patient today. - Neurology consulted, recs appreciated  - EEG to assess for seizure activity  - Restarted ASA 81 mg after discussing with NSGY  - Will restart Eliquis  on 9/16 - If any acute change in mental status since pt isn't anticoagulated, recommend CT head w/o contrast and CTA head and neck to r/o bleed/stroke  - PRN Versed  for clinical seizures - Neurosurgery consulted, appreciate recommendations  -Palliative care consulted, appreciate assistance in patient's care - Keppra  1,250 mg BID - Dexamethasone  4 mg IV Q8H - Flomax  0.4 mg currently also has a urinary catheter, will attempt void trial in 3 days  - Pain regimen: Fioricet  1 tablet Q6H PRN - Increased Lantus  to 8 units QHS and SSI w/ steroids to maintain glucose 140-180 - Nausea regimen: Zofran  4 mg PRN - Delirium: Melatonin 3 mg at 6pm and Seroquel  12.5 mg at 6 pm to minimize delirum/sundowing  - Continuous cardiac monitoring - Neuro checks Q4H - PT/OT/SLP consulted, appreciate recommendations - Fall and seizure precautions - AM Labs: BMP with GFR, CBC

## 2024-07-21 NOTE — Progress Notes (Signed)
 Pt is off for CT scan

## 2024-07-21 NOTE — Assessment & Plan Note (Addendum)
 HLD - continue home Atorvastatin  40mg  daily

## 2024-07-21 NOTE — Consult Note (Signed)
 [facility] CONSULT NOTE  Patient Care Team: Janna Ferrier, DO as PCP - General (Family Medicine) Croitoru, Jerel, MD as PCP - Cardiology (Cardiology) Francyne Jerel, MD as Consulting Physician (Cardiology) Tobie Franky SQUIBB, DPM as Consulting Physician (Podiatry)  CHIEF COMPLAINTS/PURPOSE OF CONSULTATION:  GBM  REFERRING PHYSICIAN: Dr. Delores  HISTORY OF PRESENTING ILLNESS:  Wayne Molina 68 y.o. male patient with newly diagnosed brain mass.  Path significant for GBM/glioblastoma multiforme.  He was admitted 9 8 and is status post brain biopsy.  Therefore Neuro Onc evaluation requested.   Patient is seen laying in bed with wife and other family at bedside.  He is very lethargic and ill appearing.  He was unable to answer questions initially however he did speak during assessment and examination requesting to remove mittens from his hands.  Patient was brought to ED as a Code Stroke on 8/28. Imaging was done which showed large tumor near right temporal lobe.  Neurosurgery subsequently has been managing. Medical history includes Afib on eliquis , and hypertension.   I have reviewed his chart and materials related to his cancer extensively and collaborated history with the patient. Summary of oncologic history is as follows: Oncology History   No history exists.    ASSESSMENT & PLAN:   New Brain mass, Glioblastoma multiforme (GBM) - Status post right stereotactic brain biopsy 9/8.   -- Path shows high-grade glioma, WHO grade 4 -- No treatments to start until 2-3 weeks recovery from surgery.   -- Recommend Rad Onc evaluation -- Neurology team following closely - Neuro oncology/Dr. [name] following  and will make additional treatment recommendations.  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  Leukocytosis -- WBC increasing, 49.4 today -- May be related to steroids or infectious process  -- Continue IV antibiotics as ordered -- Monitor fever curve, noted temp 101.2 within last 24 hours  Afib -- On Eliquis   Hypertension Hyperlipidemia  -- Continue to monitor BP closely     MEDICAL HISTORY:  Past Medical History:  Diagnosis Date   Anxiety    Arthritis    fingers   Chronic atrial fibrillation (HCC)    Depression    Family history of breast cancer    Family history of cancer of male genital organ    Family history of gene mutation    BRIP1   Family history of malignant neoplasm of gastrointestinal tract    Hyperlipidemia    Hypertension    Personal history of colonic polyps 11/12/2006   Sleep apnea     SURGICAL HISTORY: Past Surgical History:  Procedure Laterality Date   2-D echocardiogram  07/22/2010   Ejection fraction greater than 55%. Left atrium moderately dilated. Right atrium moderately dilated. 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 Mild to Moderate MR. Mild to moderate TR.   APPLICATION OF CRANIAL NAVIGATION Right 07/17/2024   Procedure: COMPUTER-ASSISTED NAVIGATION, FOR CRANIAL PROCEDURE;  Surgeon: Rosslyn Dino HERO, MD;  Location: [facility] OR;  Service: Neurosurgery;  Laterality: Right;  CRANIAL NAVIGATION   ATRIAL ABLATION SURGERY  2004   EP IMPLANTABLE DEVICE N/A 02/11/2016   Procedure: Loop Recorder Insertion;  Surgeon: Jerel Francyne, MD;  Location: [facility] INVASIVE CV LAB;  Service: Cardiovascular;  Laterality: N/A;   Exercise Myoview stress test  07/08/2000   Nonischemic low-risk.   HIP FRACTURE SURGERY  1970s   IR GENERIC HISTORICAL  01/21/2017   IR RADIOLOGIST EVAL & MGMT 01/21/2017 [facility]-INTERV RAD   STERIOTACTIC STIMULATOR INSERTION Right 07/17/2024   Procedure: OPEN CRANIOTOMY FOR BIOPSY;  Surgeon: Rosslyn Dino HERO, MD;  Location: [facility] OR;  Service: Neurosurgery;  Laterality: Right;  RIGHT STEREOTACTIC BRAIN BIOPSY    SOCIAL HISTORY: Social History   Socioeconomic History   Marital status: Divorced    Spouse name: Not on file   Number of children: 3   Years of education: 12   Highest education level: GED or equivalent  Occupational History   Not on file  Tobacco Use   Smoking status: Former    Current packs/day: 1.00    Types: Cigarettes   Smokeless tobacco: Never   Tobacco comments:    Smoked for approximately 6 months in his life  Vaping Use   Vaping status: Never Used  Substance and Sexual Activity   Alcohol use: No   Drug use: No   Sexual activity: Yes  Other Topics Concern   Not on file  Social History Narrative   Patient lives alone in [place] in a one story home. There are no steps.    Patient enjoys going to the gym, watching TV, and going to antique shops.    Patient is divorced with 3 children, two of which have passed; does have a girlfriend.    Patient has his own car and is able to drive.    Social Drivers of Corporate investment banker Strain: Low Risk  (03/22/2024)   Overall Financial Resource Strain (CARDIA)    Difficulty of Paying Living Expenses: Not hard at all  Food Insecurity: No Food Insecurity (03/22/2024)   Hunger Vital Sign    Worried About Running Out of Food in the Last Year: Never true    Ran Out of Food in the Last Year: Never true  Transportation Needs: No Transportation Needs (03/22/2024)   PRAPARE - Administrator, Civil Service (Medical): No    Lack of Transportation (Non-Medical): No  Physical Activity: Sufficiently Active (03/22/2024)   Exercise Vital Sign    Days of Exercise per Week: 5 days    Minutes of Exercise per Session: 60 min  Stress: No Stress Concern Present (03/22/2024)   [employer] of Occupational Health - Occupational Stress Questionnaire    Feeling of Stress : Only a little  Social Connections:  Unknown (03/22/2024)   Social Connection and Isolation Panel    Frequency of Communication with Friends and Family: Not on file    Frequency of Social Gatherings with Friends and Family: Not on file    Attends Religious Services: Not on file    Active Member of Clubs or Organizations: Not on file    Attends Banker Meetings: Not on file    Marital Status: Married  Intimate Partner Violence: Not At Risk (04/16/2023)   Humiliation, Afraid, Rape, and Kick questionnaire    Fear of Current or Ex-Partner: No    Emotionally Abused: No    Physically Abused: No    Sexually Abused: No    FAMILY HISTORY: Family History  Problem Relation Age of Onset   Dementia Mother    Heart disease Father    Diabetes Father    Other Sister        BRIP1 gene mutation   Kidney disease  Brother    Colon cancer Maternal Uncle    Lung cancer Paternal Aunt        d. >50   Cancer Paternal Aunt        unknown type, d. >50   Cancer Cousin 17       gynecologic (paternal first cousin)   Colon cancer Nephew 27       arising in colon polyp   Cancer Niece 31       gynecologic; niece in her 17's   Breast cancer Niece 94   Other Niece        BRIP1 gene mutation   Cancer Paternal Great-grandmother        abdominal cancer (PGF's mother) great grandmother     PHYSICAL EXAMINATION: ECOG PERFORMANCE STATUS: 4 - Bedbound  Vitals:   07/21/24 0818 07/21/24 1621  BP: 119/71 100/74  Pulse: 87 92  Resp: 20 18  Temp: 97.7 F (36.5 C) 97.7 F (36.5 C)  SpO2: 94% 95%   Filed Weights   07/18/24 0500 07/19/24 0702 07/21/24 0500  Weight: 162 lb 14.7 oz (73.9 kg) 163 lb 2.3 oz (74 kg) 160 lb 0.9 oz (72.6 kg)    GENERAL: +lethargic SKIN: skin color, texture, turgor are normal, no rashes or significant lesions EYES:+scleral icterus OROPHARYNX: no exudate, no erythema and lips, buccal mucosa, and tongue normal  NECK: supple, thyroid  normal size, non-tender, without nodularity LYMPH: no palpable  lymphadenopathy in the cervical, axillary or inguinal LUNGS: clear to auscultation and percussion with normal breathing effort HEART: regular rate & rhythm and no murmurs and no lower extremity edema ABDOMEN: abdomen soft, non-tender and normal bowel sounds MUSCULOSKELETAL: no cyanosis of digits and no clubbing  PSYCH: +lethargic NEURO: no focal motor/sensory deficits   ALLERGIES:  has no known allergies.  MEDICATIONS:  Current Facility-Administered Medications  Medication Dose Route Frequency Provider Last Rate Last Admin   acetaminophen  (TYLENOL ) tablet 650 mg  650 mg Oral Q6H PRN Gomes, Adriana, DO   650 mg at 07/21/24 1400   albuterol  (PROVENTIL ) (2.5 MG/3ML) 0.083% nebulizer solution 2.5 mg  2.5 mg Nebulization Q6H PRN Pawar, Rahul, MD   2.5 mg at 07/13/24 1416   aspirin  chewable tablet 81 mg  81 mg Oral Daily Delores Suzann HERO, MD   81 mg at 07/21/24 0854   atorvastatin  (LIPITOR) tablet 40 mg  40 mg Per Tube Daily Delores Suzann HERO, MD   40 mg at 07/21/24 9145   carvedilol  (COREG ) tablet 12.5 mg  12.5 mg Oral BID Tharon Lung, MD       Chlorhexidine  Gluconate Cloth 2 % PADS 6 each  6 each Topical Daily Delores Suzann HERO, MD       dexamethasone  (DECADRON ) injection 4 mg  4 mg Intravenous Q8H Janjua, Rashid M, MD   4 mg at 07/21/24 1400   feeding supplement (OSMOLITE 1.5 CAL) liquid 1,000 mL  1,000 mL Per Tube Continuous Hunsucker, Donnice SAUNDERS, MD 50 mL/hr at 07/21/24 1231 1,000 mL at 07/21/24 1231   feeding supplement (PROSource TF20) liquid 60 mL  60 mL Per Tube Daily Hunsucker, Donnice SAUNDERS, MD   60 mL at 07/21/24 0855   fiber supplement (BANATROL TF) liquid 60 mL  60 mL Per Tube BID Pawar, Rahul, MD   60 mL at 07/21/24 0855   guaiFENesin  (ROBITUSSIN) 100 MG/5ML liquid 15 mL  15 mL Oral Q6H Delores Suzann HERO, MD   15 mL at 07/21/24 1158   insulin  aspart (novoLOG ) injection 0-15  Units  0-15 Units Subcutaneous Q4H Autry, Lauren E, PA-C   5 Units at 07/21/24 1241   insulin  glargine (LANTUS )  injection 8 Units  8 Units Subcutaneous QHS Tharon Lung, MD       levETIRAcetam  (KEPPRA ) tablet 1,000 mg  1,000 mg Oral BID Shelton Arlin KIDD, MD       liver oil-zinc  oxide (DESITIN) 40 % ointment   Topical BID PRN Tharon Lung, MD   Given at 07/21/24 0230   melatonin tablet 3 mg  3 mg Oral QHS Tharon Lung, MD   3 mg at 07/20/24 2211   mupirocin  ointment (BACTROBAN ) 2 % 1 Application  1 Application Nasal BID Kara Dorn NOVAK, MD   1 Application at 07/21/24 0857   ondansetron  (ZOFRAN ) tablet 4 mg  4 mg Oral Q4H PRN Rosslyn Dino HERO, MD       Or   ondansetron  (ZOFRAN ) injection 4 mg  4 mg Intravenous Q4H PRN Rosslyn Dino HERO, MD       Oral care mouth rinse  15 mL Mouth Rinse 4 times per day Theodoro Lakes, MD   15 mL at 07/21/24 0855   Oral care mouth rinse  15 mL Mouth Rinse PRN Theodoro Lakes, MD       pantoprazole  (PROTONIX ) EC tablet 40 mg  40 mg Oral Daily Delores Suzann HERO, MD   40 mg at 07/21/24 9144   piperacillin -tazobactam (ZOSYN ) IVPB 3.375 g  3.375 g Intravenous Q8H Delores Suzann HERO, MD       QUEtiapine  (SEROQUEL ) tablet 12.5 mg  12.5 mg Oral Daily Janna, Adriana, DO   12.5 mg at 07/20/24 1722   tamsulosin  (FLOMAX ) capsule 0.4 mg  0.4 mg Oral Daily Janna Ferrier, DO   0.4 mg at 07/21/24 0854   vancomycin  (VANCOCIN ) IVPB 1000 mg/200 mL premix  1,000 mg Intravenous Q12H Delores Suzann HERO, MD         LABORATORY DATA:  I have reviewed the data as listed Lab Results  Component Value Date   WBC 49.4 (H) 07/21/2024   HGB 13.5 07/21/2024   HCT 39.8 07/21/2024   MCV 91.1 07/21/2024   PLT 372 07/21/2024   Recent Labs    07/09/24 0414 07/10/24 0503 07/10/24 1700 07/11/24 0508 07/12/24 0337 07/19/24 0739 07/20/24 0202 07/21/24 0240  NA 136 138   < > 137   < > 136 135 132*  K 4.0 3.8   < > 4.0   < > 4.4 4.7 4.6  CL 107 107  --  105   < > 101 102 102  CO2 18* 22  --  23   < > 23 22 21*  GLUCOSE 119* 115*  --  145*   < > 207* 108* 184*  BUN 16 20  --  18   < > 29* 29* 28*   CREATININE 0.80 0.84  --  0.78   < > 0.71 0.79 0.71  CALCIUM  8.5* 8.1*  --  8.1*   < > 8.8* 9.2 8.5*  GFRNONAA >60 >60  --  >60   < > >60 >60 >60  PROT 6.2* 5.8*  --  6.0*  --   --   --   --   ALBUMIN 3.1* 2.7*  --  2.7*  --   --   --   --   AST 49* 32  --  29  --   --   --   --   ALT 39 27  --  27  --   --   --   --   ALKPHOS 50 43  --  64  --   --   --   --   BILITOT 2.1* 1.3*  --  1.2  --   --   --   --    < > = values in this interval not displayed.    RADIOGRAPHIC STUDIES: I have personally reviewed the radiological images as listed and agreed with the findings in the report. DG CHEST PORT 1 VIEW Result Date: 07/21/2024 CLINICAL DATA:  68 year old male with cough.  Grade 4 Glioma. EXAM: PORTABLE CHEST 1 VIEW COMPARISON:  Portable chest 07/17/2024 and earlier. FINDINGS: Portable AP semi upright view at 0656 hours. Stable cardiomegaly and mediastinal contours. Stable left chest superficial cardiac device. Enteric feeding tube courses to the abdomen, tip not included. Lung volumes are within normal limits. Allowing for portable technique the lungs are clear. Visualized tracheal air column is within normal limits. Stable visualized osseous structures. IMPRESSION: No acute cardiopulmonary abnormality. Electronically Signed   By: VEAR Hurst M.D.   On: 07/21/2024 10:48   CT ANGIO HEAD NECK W WO CM Result Date: 07/21/2024 CLINICAL DATA:  Initial evaluation for acute neuro deficit, stroke. EXAM: CT ANGIOGRAPHY HEAD AND NECK TECHNIQUE: Multidetector CT imaging of the head and neck was performed using the standard protocol during bolus administration of intravenous contrast. Multiplanar CT image reconstructions and MIPs were obtained to evaluate the vascular anatomy. Carotid stenosis measurements (when applicable) are obtained utilizing NASCET criteria, using the distal internal carotid diameter as the denominator. RADIATION DOSE REDUCTION: This exam was performed according to the departmental  dose-optimization program which includes automated exposure control, adjustment of the mA and/or kV according to patient size and/or use of iterative reconstruction technique. CONTRAST:  75mL OMNIPAQUE  IOHEXOL  350 MG/ML SOLN COMPARISON:  Prior CT from earlier the same day. FINDINGS: CT HEAD FINDINGS Brain: Examination degraded by motion artifact. Patient's right cerebral mass with superimposed hemorrhage again noted, not significantly changed. No new finding from CT performed earlier the same day. Vascular: No visible hyperdense vessel. Skull: No visible new finding. Sinuses/Orbits: No visible new finding. Other: None. Review of the MIP images confirms the above findings CTA NECK FINDINGS Aortic arch: Aortic arch and origin of the great vessels not visualized on this exam. Atheromatous change about the visualized proximal great vessels noted. No visible stenosis. Right carotid system: Right common and internal carotid arteries are patent without stenosis or dissection. Left carotid system: Left common and internal carotid arteries are patent without dissection. Eccentric calcified plaque at the left carotid bulb with estimated 50% stenosis, stable. 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 Distal reconstitution at the V3 segment via muscular collaterals, stable. Vertebral arteries remain patent as they course into the cranial vault. No visible stenosis or dissection. Skeleton: No worrisome osseous lesions.  Patient is edentulous. Other neck: No other acute finding. Upper chest: No other acute finding. Review of the MIP images confirms the above findings CTA HEAD FINDINGS Anterior circulation: Both internal carotid arteries are patent to the termini without stenosis. A1 segments patent bilaterally. Right A1 hypoplastic. Normal anterior communicating artery complex. Anterior cerebral arteries patent without stenosis. Atheromatous plaque at  the right ICA terminus/proximal right M1 segment with short-segment mild-to-moderate stenosis (series 10, image 111). Left M1 segment widely patent. No proximal MCA branch occlusion. Distal MCA branches perfused and symmetric. Posterior circulation: Right V4  segment patent without stenosis. Right PICA not seen. Left vertebral artery patent to the takeoff of the left PICA, with subsequent reocclusion. Left PICA patent. Basilar diminutive but patent without stenosis. Superior cerebellar and posterior cerebral arteries remain patent bilaterally. Venous sinuses: Grossly patent allowing for timing the contrast bolus. Anatomic variants: As above.  No aneurysm. Review of the MIP images confirms the above findings IMPRESSION: 1. Stable CTA of the head and neck. No large vessel occlusion or other emergent finding. 2. 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 Left V4 segment reoccludes beyond the takeoff of the left PICA. 3. Atheromatous plaque at the right ICA terminus/proximal right M1 segment with short-segment mild-to-moderate stenosis. 4. Atheromatous plaque at the left carotid bulb with estimated 50% stenosis, stable. 5. Right cerebral mass with superimposed hemorrhage, not significantly changed from CT performed earlier the same day. Electronically Signed   By: Morene Hoard M.D.   On: 07/21/2024 00:03   CT Head Wo Contrast Result Date: 07/20/2024 CLINICAL DATA:  Initial evaluation for acute neuro deficit, stroke suspected. EXAM: CT HEAD WITHOUT CONTRAST TECHNIQUE: Contiguous axial images were obtained from the base of the skull through the vertex without intravenous contrast. RADIATION DOSE REDUCTION: This exam was performed according to the departmental dose-optimization program which includes automated exposure control, adjustment of the mA and/or kV according to patient size and/or use of iterative reconstruction  technique. COMPARISON:  Prior CT from 07/18/2024. FINDINGS: Brain: Postoperative changes from prior right temporal craniotomy for tumor biopsy again noted. Postoperative pneumocephalus has resolved. Previously identified infiltrating mass within the right cerebral hemisphere again seen, grossly stable. Focus of hemorrhage within the mass itself has slightly contracted in the interim, now measuring 1.6 x 1.1 x 1.3 cm, previously 1.9 x 1.2 x 1.5 cm. Trace residual hemorrhage along the biopsy tract noted as well, decreased. Similar surrounding vasogenic edema and regional mass effect without significant midline shift. No other new acute intracranial hemorrhage. No visible acute large vessel territory infarct. No other mass lesion or mass effect. No hydrocephalus. No significant extra-axial fluid collection. Vascular: No abnormal hyperdense vessel. Calcified atherosclerosis present at the skull base. Skull: Post craniotomy changes on the right. Skin staples remain in place. Sinuses/Orbits: Globes and orbital soft tissues demonstrate no acute finding. Paranasal sinuses and mastoid air cells remain largely clear. Other: Nasogastric tube in place. IMPRESSION: 1. Postoperative changes from prior right temporal craniotomy for tumor biopsy. Previously identified infiltrating mass within the right cerebral hemisphere is grossly stable. Focus of hemorrhage within the mass itself has slightly contracted in the interim, now measuring 1.6 x 1.1 x 1.3 cm, previously 1.9 x 1.2 x 1.5 cm. Trace residual hemorrhage along the biopsy tract has decreased as well. Similar surrounding vasogenic edema and regional mass effect without significant midline shift. 2. No other new acute intracranial abnormality. Electronically Signed   By: Morene Hoard M.D.   On: 07/20/2024 21:01   CT HEAD WO CONTRAST Result Date: 07/18/2024 CLINICAL DATA:  Initial postoperative evaluation. EXAM: CT HEAD WITHOUT CONTRAST TECHNIQUE: Contiguous axial  images were obtained from the base of the skull through the vertex without intravenous contrast. RADIATION DOSE REDUCTION: This exam was performed according to the departmental dose-optimization program which includes automated exposure control, adjustment of the mA and/or kV according to patient size and/or use of iterative reconstruction technique. COMPARISON:  Comparison made with prior CT from 07/08/2024 as well as previous exams. FINDINGS: Brain: Postoperative  changes from interval right temporal craniotomy for presumed biopsy of previously identified infiltrating right cerebral mass. Small amount of postoperative pneumocephalus overlies the right cerebral convexity. Focus of hemorrhage measuring up to 1.4 cm seen along the biopsy tract at the posterior right temporal lobe (series 2, image 15). Additional focus of hemorrhage within the resection cavity itself measures 1.9 x 1.2 x 1.5 cm (series 2, image 18). No other complicating features. The underlying infiltrating mass involving the right temporal lobe with extension into the right splenium is otherwise grossly stable. Surrounding vasogenic edema with mass effect on the posterior right lateral ventricle, stable. No midline shift. Basilar cisterns remain patent. Remainder of the brain is otherwise stable. No other acute intracranial hemorrhage. No acute large vessel territory infarct. No other mass lesion or mass effect. No hydrocephalus or extra-axial fluid collection. Vascular: No abnormal hyperdense vessel. Calcified atherosclerosis present at the skull base. Skull: Post craniotomy changes on the right. Skin staples in subgaleal drain remain in place. No adverse features. Sinuses/Orbits: Globes orbital soft tissues demonstrate no acute finding. Paranasal sinuses are largely clear. Small right with trace left mastoid effusions, of doubtful significance. Nasogastric tube in place. Other: None. IMPRESSION: 1. Postoperative changes from interval right temporal  craniotomy for biopsy of patient's known right cerebral mass. Small volume postoperative hemorrhage along the biopsy tract and within the resection cavity as above. No other complicating features. The underlying right cerebral mass is otherwise grossly stable. 2. No other new acute intracranial abnormality. Electronically Signed   By: Morene Hoard M.D.   On: 07/18/2024 02:12   DG CHEST PORT 1 VIEW Result Date: 07/17/2024 CLINICAL DATA:  [id number] Respiratory failure (HCC) [id number]. EXAM: PORTABLE CHEST 1 VIEW COMPARISON:  07/13/2024. FINDINGS: There is nonspecific heterogeneous opacity overlying the lateral aspect of the right mid lung zone, which was likely present on the prior radiograph. This is indeterminate. Bilateral lung fields are otherwise clear. No acute consolidation or lung collapse. Bilateral costophrenic angles are clear. Stable cardio-mediastinal silhouette. Presumed loop recorder device noted overlying the left lower chest. No acute osseous abnormalities. The soft tissues are within normal limits. Enteric tube is seen coursing below the left hemidiaphragm however, the tip and side hole are not included in the film. IMPRESSION: *No acute cardiopulmonary abnormality. Nonspecific heterogeneous opacity overlying the lateral aspect of the right mid lung zone, which was likely present on the prior radiograph. This is indeterminate. Correlate clinically to determine the need for further evaluation with nonemergent chest CT scan. Electronically Signed   By: Ree Molt M.D.   On: 07/17/2024 08:18   DG Chest Port 1 View Result Date: 07/13/2024 CLINICAL DATA:  Respiratory distress. EXAM: PORTABLE CHEST 1 VIEW COMPARISON:  Chest radiograph dated 07/11/2024 FINDINGS: Enteric tube extends below the diaphragm with tip beyond the inferior margin of the image. Right-sided PICC with tip over central SVC. There is cardiomegaly with vascular congestion and edema, significantly progressed since the prior  radiograph. Pneumonia is not excluded. A small left pleural effusion may be present. No pneumothorax. A loop recorder device. No acute osseous pathology. IMPRESSION: Cardiomegaly with findings of CHF, significantly progressed since the prior radiograph. Pneumonia is not excluded. Electronically Signed   By: Vanetta Chou M.D.   On: 07/13/2024 19:24   DG Chest Port 1 View Result Date: 07/11/2024 CLINICAL DATA:  [id number].  Pneumonia. EXAM: PORTABLE CHEST 1 VIEW COMPARISON:  Portable chest yesterday at 5:05 p.m. FINDINGS: 5:48 a.m. ETT tip is 3.6 cm from the carina. NGT enters  the stomach with the intragastric course not fully seen. Right PICC tip approaches the superior cavoatrial junction. There is a left chest loop recorder device. Stable cardiomegaly. There is mild perihilar vascular prominence but no overt edema. There are small layering pleural effusions, asymmetric consolidation left lower lobe. Remaining lungs appear clear.  No new or worsening findings. Stable mediastinum with calcification in the transverse aorta. Stable osseous structures. IMPRESSION: 1. No significant change from yesterday's study. 2. Stable cardiomegaly and mild perihilar vascular prominence. 3. Small layering pleural effusions and left lower lobe consolidation. Electronically Signed   By: Francis Quam M.D.   On: 07/11/2024 07:52   DG CHEST PORT 1 VIEW Result Date: 07/10/2024 CLINICAL DATA:  Central line placement. Clinical notes confirm venous placement. EXAM: PORTABLE CHEST 1 VIEW COMPARISON:  Earlier today FINDINGS: Unchanged positioning of right upper extremity PICC, the tip projects over the mid mediastinum to the left of midline. Endotracheal tube tip 3.7 cm from the carina. Enteric tube tip below the diaphragm cardiomegaly is unchanged. Left chest wall loop recorder. Unchanged left lung base opacity and pleural effusion. No pneumothorax or new airspace disease. IMPRESSION: 1. Unchanged positioning of right upper extremity  PICC, the tip projects over the mid mediastinum to the left of midline. Given confirmation of venous placement clinically, this is in the region of the mid SVC. 2. Unchanged left lung base opacity and pleural effusion. Electronically Signed   By: Andrea Gasman M.D.   On: 07/10/2024 20:41   DG CHEST PORT 1 VIEW Result Date: 07/10/2024 CLINICAL DATA:  [id number] S/P PICC central line placement [id number] EXAM: PORTABLE CHEST 1 VIEW COMPARISON:  Chest x-ray 07/10/2024 FINDINGS: Endotracheal tube terminates 4 cm above the carina. Right PICC with tip overlying mid mediastinum just inferior to the carina. Enteric tube courses below the hemidiaphragm with tip and side port collimated off view. Wireless cardiac device overlies the left chest. The heart and mediastinal contours are unchanged. Persistent left lower lung zone opacity. No pulmonary edema. No right pleural effusion. Possible left pleural effusion. No pneumothorax. No acute osseous abnormality. IMPRESSION: 1. Right PICC with tip overlying mid mediastinum just inferior to the carina. Question malpositioning/arterial positioning. Consider repeat chest x-ray and possible use of CT for further evaluation. 2. Persistent left lower lung zone opacity. Possible left pleural effusion. These results will be called to the ordering clinician or representative by the Radiologist Assistant, and communication documented in the PACS or [employer]. Electronically Signed   By: Morgane  Naveau M.D.   On: 07/10/2024 16:00   US  EKG SITE RITE Result Date: 07/10/2024 If Site Rite image not attached, placement could not be confirmed due to current cardiac rhythm.  DG Chest Port 1 View Result Date: 07/10/2024 CLINICAL DATA:  Interest per dependence, endotracheal tube EXAM: PORTABLE CHEST 1 VIEW COMPARISON:  July 07, 2024 chest radiograph FINDINGS: Endotracheal tube in place. Partially imaged enteric tube. Unchanged cardiac loop recorder projecting over the left chest wall.  Bibasilar opacities, slightly increased from prior and may represent atelectasis. Unchanged cardiomediastinal silhouette. IMPRESSION: Unchanged support devices. Slightly increased bibasilar opacities, possibly representing atelectasis. Electronically Signed   By: Michaeline Blanch M.D.   On: 07/10/2024 11:44   Overnight EEG with video Result Date: 07/08/2024 Shelton Arlin KIDD, MD     07/09/2024  7:10 AM Patient Name: DERRY KASSEL MRN: [id number] Epilepsy Attending: Arlin KIDD Shelton Referring Physician/Provider: Khaliqdina, Salman, MD Duration: 07/07/2024 9070 to 07/08/2024 9070 Patient history: 68 y.o. 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 EEG to evaluate for seizure Level of alertness: comatose/ lethargic AEDs during EEG study: LEV, propofol , Versed ,VPA Technical aspects: This EEG study was done with scalp electrodes positioned according to the 10-20 International system of electrode placement. Electrical activity was reviewed with band pass filter of 1-70Hz , sensitivity of 7 uV/mm, display speed of 60mm/sec with a 60Hz  notched filter applied as appropriate. EEG data were recorded continuously and digitally stored.  Video monitoring was available and reviewed as appropriate. Description: EEG showed continuous generalized and lateralized right hemisphere 3 to 6 Hz theta-delta slowing admixed with 13-15hz  beta activity. At the beginning of the study, EEG also showed lateralized periodic discharges ( LPDs) with overriding fast activity at 1-1.5Hz  in right hemisphere. As sedation was adjusted, overriding fast activity improved Hyperventilation and photic stimulation were not performed.   ABNORMALITY - Lateralized periodic discharges with overriding fast activity ( LPD +F)  right hemisphere - Continuous slow, generalized and lateralized right hemisphere IMPRESSION: This study showed evidence of epileptogenicity and cortical dysfunction  arising from right hemisphere likely due to underlying structural abnormality. This eeg pattern is on the ictal-interictal continuum with high risk of seizure recurrence. Additionally there is severe diffuse encephalopathy, likely related to sedation. No seizures were seen throughout the recording. Priyanka MALVA Krebs   CT HEAD WO CONTRAST ( ) Result Date: 07/08/2024 CLINICAL DATA:  Initial evaluation for acute mental status change, unknown cause. EXAM: CT HEAD WITHOUT CONTRAST TECHNIQUE: Contiguous axial images were obtained from the base of the skull through the vertex without intravenous contrast. RADIATION DOSE REDUCTION: This exam was performed according to the departmental dose-optimization program which includes automated exposure control, adjustment of the mA and/or kV according to patient size and/or use of iterative reconstruction technique. COMPARISON:  Comparison made with prior studies from 07/07/2024. FINDINGS: Brain: Examination somewhat technically limited by streak artifact from overlying EEG leads. Previously identified infiltrating mass involving the right temporal lobe and posterior right splenium again seen, relatively stable from prior. Surrounding vasogenic edema, also unchanged. No acute intracranial hemorrhage. No other acute large vessel territory infarct. No other mass lesion or mass effect. No hydrocephalus or extra-axial fluid collection. Few small chronic cerebellar infarcts again noted. Vascular: No abnormal hyperdense vessel. Calcified atherosclerosis present at skull base. Skull: No acute finding.  EEG leads overlie the scalp. Sinuses/Orbits: Globes and orbital soft tissues within normal limits. Paranasal sinuses and mastoid air cells remain largely clear. Other: Endotracheal tube partially visualized. IMPRESSION: 1. Stable head CT with infiltrating mass involving the posterior right temporal lobe and splenium with surrounding vasogenic edema. 2. No other new acute intracranial  abnormality. Electronically Signed   By: Morene Hoard M.D.   On: 07/08/2024 02:47   ECHOCARDIOGRAM COMPLETE Result Date: 07/07/2024    ECHOCARDIOGRAM REPORT   Patient Name:   NAYDEN CZAJKA Date of Exam: 07/07/2024 Medical Rec #:  [id number]     Height:       69.0 in Accession #:    [id number]    Weight:       163.0 lb Date of Birth:  02/26/1956      BSA:          1.894 m Patient Age:    68 years      BP:           104/66 mmHg Patient Gender: M  HR:           69 bpm. Exam Location:  Inpatient Procedure: 2D Echo, Color Doppler and Cardiac Doppler (Both Spectral and Color            Flow Doppler were utilized during procedure). Indications:    Stroke  History:        Patient has prior history of Echocardiogram examinations, most                 recent 06/11/2022.  Sonographer:    Tinnie Rgers RDCS Referring Phys: [id number] COURTNEY F HORTON IMPRESSIONS  1. Left ventricular ejection fraction, by estimation, is 60 to 65%. Left ventricular ejection fraction by PLAX is 61 %. The left ventricle has normal function. The left ventricle has no regional wall motion abnormalities. There is moderate left ventricular hypertrophy. Left ventricular diastolic function could not be evaluated.  2. Right ventricular systolic function is moderately reduced. The right ventricular size is mildly enlarged.  3. Left atrial size was moderately dilated.  4. Right atrial size was severely dilated.  5. The mitral valve is grossly normal. Trivial mitral valve regurgitation.  6. The aortic valve is calcified. Aortic valve regurgitation is mild. Aortic valve sclerosis/calcification is present, without any evidence of aortic stenosis.  7. The inferior vena cava is dilated in size with <50% respiratory variability, suggesting right atrial pressure of 15 mmHg.  8. Cannot exclude a small PFO.  9. Rhythm strip during this exam demonstrates atrial fibrillation. Comparison(s): Changes from prior study are noted. 03/15/2024: LVEF 65-70%,  mildly dilated RV with normal systolic function. FINDINGS  Left Ventricle: Left ventricular ejection fraction, by estimation, is 60 to 65%. Left ventricular ejection fraction by PLAX is 61 %. The left ventricle has normal function. The left ventricle has no regional wall motion abnormalities. The left ventricular internal cavity size was normal in size. There is moderate left ventricular hypertrophy. Left ventricular diastolic function could not be evaluated due to atrial fibrillation. Left ventricular diastolic function could not be evaluated. Right Ventricle: The right ventricular size is mildly enlarged. No increase in right ventricular wall thickness. Right ventricular systolic function is moderately reduced. Left Atrium: Left atrial size was moderately dilated. Right Atrium: Right atrial size was severely dilated. Pericardium: There is no evidence of pericardial effusion. Mitral Valve: The mitral valve is grossly normal. Trivial mitral valve regurgitation. Tricuspid Valve: The tricuspid valve is grossly normal. Tricuspid valve regurgitation is trivial. Aortic Valve: The aortic valve is calcified. Aortic valve regurgitation is mild. Aortic valve sclerosis/calcification is present, without any evidence of aortic stenosis. Pulmonic Valve: The pulmonic valve was normal in structure. Pulmonic valve regurgitation is not visualized. Aorta: The aortic root and ascending aorta are structurally normal, with no evidence of dilitation. Venous: The inferior vena cava is dilated in size with less than 50% respiratory variability, suggesting right atrial pressure of 15 mmHg. IAS/Shunts: Cannot exclude a small PFO. EKG: Rhythm strip during this exam demonstrates atrial fibrillation.  LEFT VENTRICLE PLAX 2D LV EF:         Left            Diastology                ventricular     LV e' lateral: 14.10 cm/s                ejection                fraction by  PLAX is 61                %. LVIDd:         4.27 cm LVIDs:          2.88 cm LV PW:         1.29 cm LV IVS:        1.24 cm LVOT diam:     2.00 cm LV SV:         44 LV SV Index:   23 LVOT Area:     3.14 cm  RIGHT VENTRICLE            IVC RV S prime:     6.66 cm/s  IVC diam: 2.23 cm TAPSE (M-mode): 1.3 cm LEFT ATRIUM             Index        RIGHT ATRIUM           Index LA diam:        4.67 cm 2.47 cm/m   RA Area:     24.70 cm LA Vol (A2C):   73.9 ml 39.02 ml/m  RA Volume:   73.70 ml  38.91 ml/m LA Vol (A4C):   72.9 ml 38.49 ml/m LA Biplane Vol: 76.0 ml 40.13 ml/m  AORTIC VALVE LVOT Vmax:   76.60 cm/s LVOT Vmean:  47.000 cm/s LVOT VTI:    0.140 m  AORTA Ao Root diam: 3.04 cm Ao Asc diam:  2.92 cm  SHUNTS Systemic VTI:  0.14 m Systemic Diam: 2.00 cm Vinie Maxcy MD Electronically signed by Vinie Maxcy MD Signature Date/Time: 07/07/2024/4:18:11 PM    Final    DG Chest Portable 1 View Result Date: 07/07/2024 CLINICAL DATA:  Intubation. EXAM: PORTABLE CHEST 1 VIEW COMPARISON:  August 08, 2022. FINDINGS: Stable cardiomegaly. Endotracheal and nasogastric tubes are in grossly good position. Lungs are clear. Bony thorax is unremarkable. IMPRESSION: Endotracheal and nasogastric tubes are in grossly good position. Electronically Signed   By: Lynwood Landy Raddle M.D.   On: 07/07/2024 13:25   DG Abd Portable 1 View Result Date: 07/07/2024 CLINICAL DATA:  Nasogastric tube placement. EXAM: PORTABLE ABDOMEN - 1 VIEW COMPARISON:  None Available. FINDINGS: Two images of the abdomen were obtained. On the second image, nasogastric tube tip is seen in expected position of proximal stomach. IMPRESSION: Nasogastric tube tip is seen in expected position of proximal stomach. Electronically Signed   By: Lynwood Landy Raddle M.D.   On: 07/07/2024 13:23   MR BRAIN W WO CONTRAST Result Date: 07/07/2024 CLINICAL DATA:  68 year old male with new onset seizure. Code stroke presentation. EXAM: MRI HEAD WITHOUT AND WITH CONTRAST TECHNIQUE: Multiplanar, multiecho pulse sequences of the brain and  surrounding structures were obtained without and with intravenous contrast. CONTRAST:  7.5mL GADAVIST  GADOBUTROL  1 MMOL/ML IV SOLN COMPARISON:  Head CTs and CTA earlier today. FINDINGS: Study is mildly degraded by motion artifact despite repeated imaging attempts. Brain: Nodular, heterogeneously enhancing, and elongated tumor in the posterior right hemisphere tracks from the splenium of the corpus callosum near midline laterally into the right temporal lobe. Enhancing component of tumor encompasses an area of about 3 cm by close to 6 cm x 3.3 cm (AP by transverse by CC). Surrounding confluent T2 and FLAIR hyperintensity. Mild hemosiderin or mineralization on T2*. Abnormal diffusion which appears to be on the basis of hypercellularity, with mildly diminished T2 hyperintensity in those areas. Confluent periventricular involvement along the posterior right lateral ventricle,  atrium. No contralateral or additional abnormal enhancement identified. No dural thickening identified. No midline shift or significant intracranial mass effect at this time. Normal basilar cisterns. No superimposed restricted diffusion suggestive of acute infarction. No ventriculomegaly, extra-axial collection or acute intracranial hemorrhage. Cervicomedullary junction and pituitary are within normal limits. 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 No other hemosiderin identified. No cortical encephalomalacia identified. Coronal thin slice imaging. Tumoral edema adjacent to the right hippocampal formation which otherwise appears within normal limits. Other mesial temporal lobe structures within normal limits. Vascular: Major intracranial vascular flow voids appear preserved. Following contrast the major dural venous sinuses are enhancing and appear to be patent. Skull and upper cervical spine: Negative. Visualized bone marrow signal is within normal limits. Sinuses/Orbits: Leftward gaze, otherwise  negative. Paranasal Visualized paranasal sinuses and mastoids are stable and well aerated. Other: Visible internal auditory structures appear normal. Scalp EEG leads in place. IMPRESSION: 1. Positive for relatively large and infiltrative tumor with heterogeneous enhancement tracking from the splenium of the corpus callosum near midline into the right temporal lobe. Confluent regional tumoral edema. No midline shift or significant intracranial mass effect. Top differential considerations are Glioblastoma and CNS Lymphoma. 2. No other acute intracranial abnormality, with underlying small chronic cerebellar infarcts. Electronically Signed   By: VEAR Hurst M.D.   On: 07/07/2024 08:53   CT HEAD WO CONTRAST ( ) Result Date: 07/07/2024 CLINICAL DATA:  Initial evaluation for acute mental status change, unknown cause. EXAM: CT HEAD WITHOUT CONTRAST TECHNIQUE: Contiguous axial images were obtained from the base of the skull through the vertex without intravenous contrast. RADIATION DOSE REDUCTION: This exam was performed according to the departmental dose-optimization program which includes automated exposure control, adjustment of the mA and/or kV according to patient size and/or use of iterative reconstruction technique. COMPARISON:  Comparison made with CTs from earlier the same day. FINDINGS: Brain: IV contrast materials on board related to CTA performed earlier the same evening. Now evident is a probable enhancing intra-axial mass involving the posterior right frontotemporal region, in the region of previously suspected right MCA infarct. The mass is enhancing and appears bilobed. Dominant component at the right temporal region measures 2.9 cm (series 5, image 15). Additional dominant component partially extending towards the midline at the splenium measures up to 3.7 cm (series 5, image 18). Surrounding hypodensity therefore likely reflects vasogenic edema. Mild mass effect on the adjacent right lateral ventricle  without midline shift. No other visible mass lesion or abnormal enhancement. No acute intracranial hemorrhage. No acute large vessel territory infarct. No extra-axial fluid collection. Suspected small remote right cerebellar infarct noted. Vascular: IV contrast material seen throughout the intracranial vasculature. Calcified atherosclerosis present at skull base. Skull: Scalp soft tissues and calvarium demonstrate no new finding. Sinuses/Orbits: Globes orbital soft tissues demonstrate no acute finding. Paranasal sinuses and mastoid air cells remain clear. Other: None. IMPRESSION: 1. Now evident is an enhancing intra-axial mass involving the posterior right frontotemporal region, in the region of previously suspected right MCA infarct. The enhancing mass is bilobed, with dominant components measuring 2.9 cm and 3.7 cm at the right temporal lobe and splenium respectively. Surrounding vasogenic edema without midline shift. 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 Further evaluation with dedicated MRI, with and without contrast, recommended. 2. No other acute intracranial abnormality. Case briefly discussed by telephone at the time of interpretation on 07/07/2024 at 1:35 a.m. to provider [facility]. Electronically Signed   By: Morene Hoard  M.D.   On: 07/07/2024 01:46   CT ANGIO HEAD NECK W WO CM (CODE STROKE) Result Date: 07/07/2024 CLINICAL DATA:  Initial evaluation for acute neuro deficit, stroke. EXAM: CT ANGIOGRAPHY HEAD AND NECK WITH AND WITHOUT CONTRAST TECHNIQUE: Multidetector CT imaging of the head and neck was performed using the standard protocol during bolus administration of intravenous contrast. Multiplanar CT image reconstructions and MIPs were obtained to evaluate the vascular anatomy. Carotid stenosis measurements (when applicable) are obtained utilizing NASCET criteria, using the distal internal carotid diameter as the denominator. RADIATION DOSE REDUCTION: This  exam was performed according to the departmental dose-optimization program which includes automated exposure control, adjustment of the mA and/or kV according to patient size and/or use of iterative reconstruction technique. CONTRAST:  75mL OMNIPAQUE  IOHEXOL  350 MG/ML SOLN COMPARISON:  CT from earlier the same day. FINDINGS: CTA NECK FINDINGS Aortic arch: Aortic arch within normal limits for caliber standard branch pattern. Aortic atherosclerosis. No significant stenosis about the origin the great vessels. Right carotid system: No evidence of dissection, stenosis (50% or greater), or occlusion. Left carotid system: Left common and internal carotid arteries are patent without dissection. Mixed plaque about the left carotid bulb with estimated 50% stenosis by NASCET criteria. 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 Distal reconstitution at the V3 segments via muscular collaterals, greater on the right (series 5, image 165, 160). Vertebral arteries are patent as they course into the cranial vault. No visible stenosis or dissection. Skeleton: No worrisome osseous lesions.  Patient is edentulous. Other neck: No other acute finding. Upper chest: No other acute finding. Review of the MIP images confirms the above findings CTA HEAD FINDINGS Anterior circulation: Both internal carotid arteries are patent through the siphons without stenosis. A1 segments patent bilaterally. Right A1 hypoplastic. Normal anterior communicating artery complex. Anterior cerebral arteries patent without stenosis. No M1 stenosis or occlusion. No proximal MCA branch occlusion. Distal MCA branches perfused and fairly symmetric. Posterior circulation: Right V4 segment patent without stenosis. Left V4 segment patent proximally to the takeoff of the left PICA, but largely acute lose distally to the vertebrobasilar junction. Left PICA patent. Right PICA not well seen. Basilar  diminutive but patent without stenosis. Superior cerebral arteries patent bilaterally. Left PCA supplied via the basilar. Right PCA supplied via the basilar as well as a robust right posterior communicating artery. Both PCAs patent to their distal aspects without significant stenosis. Venous sinuses: Grossly patent allowing for timing the contrast bolus. Anatomic variants: As above.  No aneurysm. Review of the MIP images confirms the above findings IMPRESSION: 1. Negative CTA for acute large vessel occlusion or other emergent finding. 2. 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 Distal left V4 segment subsequently reoccludes beyond the takeoff of the left PICA. 3. Atheromatous change about the left carotid bulb with estimated 50% stenosis by NASCET criteria. Aortic Atherosclerosis (ICD10-I70.0). Electronically Signed   By: Morene Hoard M.D.   On: 07/07/2024 00:50   CT HEAD CODE STROKE WO CONTRAST Result Date: 07/07/2024 CLINICAL DATA:  Code stroke. Initial evaluation for acute neuro deficit, stroke. No other relevant history provided. EXAM: CT HEAD WITHOUT CONTRAST TECHNIQUE: Contiguous axial images were obtained from the base of the skull through the vertex without intravenous contrast. RADIATION DOSE REDUCTION: This exam was performed according to the departmental dose-optimization program which includes automated exposure control, adjustment of the mA and/or  kV according to patient size and/or use of iterative reconstruction technique. COMPARISON:  None Available. FINDINGS: Brain: Cerebral volume within normal limits. 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 No associated hemorrhage. No significant mass effect. Gray-white matter differentiation otherwise maintained. No mass lesion or midline shift. No hydrocephalus or extra-axial fluid  collection. Vascular: No convincing hyperdense vessel by CT. Scattered vascular calcifications noted within the carotid siphons. Skull: Scalp soft tissues within normal limits.  Calvarium intact. Sinuses/Orbits: Globes orbital soft tissues within normal limits. Paranasal sinuses and mastoid air cells are largely clear. Other: None. ASPECTS [facility] Stroke Program Early CT Score) - Ganglionic level infarction (caudate, lentiform nuclei, internal capsule, insula, M1-M3 cortex): 6 - Supraganglionic infarction (M4-M6 cortex): 2 Total score (0-10 with 10 being normal): 8 IMPRESSION: 1. 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 No associated hemorrhage or significant mass effect. 2. Aspects is 8. These results were communicated to Dr. Vanessa at 12:18 am on 07/07/2024 by text page via the [facility] messaging system. Electronically Signed   By: Morene Hoard M.D.   On: 07/07/2024 00:19   CUP PACEART REMOTE DEVICE CHECK Result Date: 06/22/2024 ILR summary report received. Battery status OK. Normal device function. No new symptom, tachy, brady, or pause episodes. No new AF episodes. Presenting EGM c/w atrial arrhythmia, no AF episodes recorded, on Eliquis  per Epic with hx of PAF; routed to clinic for review. Monthly summary reports and ROV/PRN. [facility], [facility]    The total time spent in the appointment was 55 minutes encounter with patients including review of chart and various tests results, discussions about plan of care and coordination of care plan   All questions were answered. The patient knows to call the clinic with any problems, questions or concerns. No barriers to learning was detected.  Olam JINNY Brunner, NP 9/12/20254:46 PM

## 2024-07-21 NOTE — Progress Notes (Signed)
 vLTM Setup  All impedances below 10kohms.  [facility] to monitor

## 2024-07-21 NOTE — Assessment & Plan Note (Addendum)
 A-fib with RVR overnight. RVR resolved by time night team saw patient. Rate 87 this morning. Currently holding patient's Eliquis  s/p R craniotomy with tumor biopsy on 9/8. - ASA 81 mg per neuro for bridge while off anticoagulation for 7 days s/p biopsy, likely will restart Eliquis  on 9/16. - Will increase Coreg  to 12.5 twice daily today  - EKG: A-fib, ST segment changes, similar to prior earlier this morning

## 2024-07-21 NOTE — Assessment & Plan Note (Addendum)
 Smallbore feeding tube left nare. - Feeding supplement (Osmolite 1.5 Cal) liquid 50 mL/hr continuous until discontinued - Feeding supplement (Prosource TF 20) liquid 60 mL daily - Fiber supplement (Banatrol TF) liquid 60 mL BID - Mupirocin  ointment 2% twice daily - Protonix  40 mg daily

## 2024-07-21 NOTE — Plan of Care (Signed)
 FMTS Interim Progress Note  S: Went to bedside to examine patient in the setting of elevated WBC from yesterday. Denies any current pain. Earlier had concerns about headache and leg pain.   O: BP 119/71 (BP Location: Right Arm)   Pulse 87   Temp 97.7 F (36.5 C) (Oral)   Resp 20   Ht 5' 9 (1.753 m)   Wt 72.6 kg   SpO2 94%   BMI 23.64 kg/m   General: Ill-appearing, awake, able to answer questions and commands HEENT: EEG leads on. NGT in place Cardiovascular: Irregularly irregular. Regular rate.  Respiratory: Improved WOB since this AM. CTAB.  Abdomen: Soft, non-tender, non-distended. Bowel sounds normoactive Extremities: Able to turn in bed. No BLE edema, no deformities or significant joint findings. No TTP. Ted hose in place.  Skin: Warm and dry.  No sacral wounds or ulcers noted. Neuro: PERRLA. EOMI.   A/P: Glioblastoma Multiforme Multidisciplinary team on board including neurosurgery, neurology-oncology, neurology, and palliative care to help with goals/treatment options for patient.  Elevated white count possibly in the setting of new infection, steroid use, and tumor; however patient has remained afebrile since last fever of 101.2 at 9/11 2350, with improved respirations since this morning after broadening abx to Vanc and Zosyn .  No concerning abnormalities noted on exam to increase current concern for skin or abdominal infection at this time.  Urinary catheter in for 7 days at this time.  - DVT ultrasound ordered to rule out DVT with complaints of leg pain - Tylenol  as needed for pain, attempting to avoid sedating narcotics to minimize sedation- - Will continue to monitor vitals and recheck labs in AM - Rest of plan per progress note  Janna Ferrier, DO 07/21/2024, 3:27 PM PGY-2, [facility] Family Medicine Service pager [phone]

## 2024-07-21 NOTE — Plan of Care (Signed)
 FMTS Interim Progress Note  Patient assessed at bedside as part of night rounds with wife present.  Patient sitting up in bed, alert and readily communicative.  Per wife he has had more energy and just recently attempted to sit up at the edge of the bed by himself and leave the bed.  Reports she called out and had RN assist him back into the bed.  Denies any neurologic concerns or changes in the evening.  O: BP 133/78 (BP Location: Right Arm)   Pulse 80   Temp 98.6 F (37 C) (Oral)   Resp 18   Ht 5' 9 (1.753 m)   Wt 72.6 kg   SpO2 95%   BMI 23.64 kg/m    General: Sitting up in bed, EEG and NG in place CV: Irregularly irregular rhythm.  Rate controlled.  No murmur. RESP: Normal work of breathing on room air Abdomen: Soft, nontender, nondistended Neuro: Alert, dysarthric but clear speech than previous night.  Able to lift all extremities equally with good strength.  A/P: Glioblastoma with secondary seizures Alert, mentation much improved from previous night.  EEG in place, no seizure activity per neurology but will be in place overnight.  Some disorientation but has been redirectable, will continue Seroquel  and avoid further intervention if possible given waxing and waning mental status.  HAP Receiving broad-spectrum antibiotics, normal work of breathing on room air during exam.  Continue current care.  A-fib Continues to be in A-fib but rate controlled.  Remainder of plan per day team.  Orders reviewed and updated as needed.  Wayne Pagan, MD 07/21/2024, 10:21 PM PGY-3, [facility] Family Medicine Service pager [phone]

## 2024-07-21 NOTE — Telephone Encounter (Signed)
 9/11 @ 3:32 pm left voicemail with patient to our office to be sch for consult.

## 2024-07-22 ENCOUNTER — Inpatient Hospital Stay (HOSPITAL_COMMUNITY)

## 2024-07-22 ENCOUNTER — Encounter (HOSPITAL_COMMUNITY)

## 2024-07-22 DIAGNOSIS — C719 Malignant neoplasm of brain, unspecified: Secondary | ICD-10-CM | POA: Diagnosis not present

## 2024-07-22 DIAGNOSIS — F05 Delirium due to known physiological condition: Secondary | ICD-10-CM | POA: Insufficient documentation

## 2024-07-22 DIAGNOSIS — A419 Sepsis, unspecified organism: Secondary | ICD-10-CM | POA: Diagnosis not present

## 2024-07-22 DIAGNOSIS — Y95 Nosocomial condition: Secondary | ICD-10-CM

## 2024-07-22 DIAGNOSIS — R6520 Severe sepsis without septic shock: Secondary | ICD-10-CM | POA: Diagnosis not present

## 2024-07-22 DIAGNOSIS — R569 Unspecified convulsions: Secondary | ICD-10-CM | POA: Diagnosis not present

## 2024-07-22 LAB — GLUCOSE, CAPILLARY
Glucose-Capillary: 168 mg/dL — ABNORMAL HIGH (ref 70–99)
Glucose-Capillary: 194 mg/dL — ABNORMAL HIGH (ref 70–99)
Glucose-Capillary: 260 mg/dL — ABNORMAL HIGH (ref 70–99)
Glucose-Capillary: 240 mg/dL — ABNORMAL HIGH (ref 70–99)
Glucose-Capillary: 188 mg/dL — ABNORMAL HIGH (ref 70–99)

## 2024-07-22 LAB — CBC
HCT: 38.6 % — ABNORMAL LOW (ref 39.0–52.0)
Hemoglobin: 13.1 g/dL (ref 13.0–17.0)
MCH: 31 pg (ref 26.0–34.0)
MCHC: 33.9 g/dL (ref 30.0–36.0)
MCV: 91.3 fL (ref 80.0–100.0)
Platelets: 401 K/uL — ABNORMAL HIGH (ref 150–400)
RBC: 4.23 MIL/uL (ref 4.22–5.81)
RDW: 12.4 % (ref 11.5–15.5)
WBC: 43.7 K/uL — ABNORMAL HIGH (ref 4.0–10.5)
nRBC: 0 % (ref 0.0–0.2)

## 2024-07-22 LAB — BASIC METABOLIC PANEL WITH GFR
Anion gap: 8 (ref 5–15)
BUN: 27 mg/dL — ABNORMAL HIGH (ref 8–23)
CO2: 22 mmol/L (ref 22–32)
Calcium: 8.6 mg/dL — ABNORMAL LOW (ref 8.9–10.3)
Chloride: 101 mmol/L (ref 98–111)
Creatinine, Ser: 0.8 mg/dL (ref 0.61–1.24)
GFR, Estimated: >60 mL/min (ref >60)
Glucose, Bld: 246 mg/dL — ABNORMAL HIGH (ref 70–99)
Potassium: 4.5 mmol/L (ref 3.5–5.1)
Sodium: 131 mmol/L — ABNORMAL LOW (ref 135–145)

## 2024-07-22 MED ORDER — QUETIAPINE FUMARATE 25 MG PO TABS
25.0000 mg | ORAL_TABLET | Freq: Every day | ORAL | Status: DC
  Administered 2024-07-22 – 2024-08-03 (×13): 25 mg via ORAL
  Filled 2024-07-22 (×13): qty 1

## 2024-07-22 MED ORDER — QUETIAPINE FUMARATE 25 MG PO TABS
12.5000 mg | ORAL_TABLET | ORAL | Status: DC
  Administered 2024-07-22 – 2024-07-30 (×8): 12.5 mg via ORAL
  Filled 2024-07-22 (×8): qty 1

## 2024-07-22 NOTE — Assessment & Plan Note (Signed)
 Afebrile. Lung sounds improved this morning. Normal work of breathing.  - Covid and flu both negative - Vancomycin  and Zosyn  per pharmacy

## 2024-07-22 NOTE — Procedures (Signed)
 Patient Name: Wayne Molina  MRN: [id number]  Epilepsy Attending: Arlin MALVA Krebs  Referring Physician/Provider: Khaliqdina, Salman, MD  Duration: 07/21/2024 1227 to 07/22/2024 1227   Patient history: 68 y.o. 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 EEG to evaluate for seizure   Level of alertness: awake/ lethargic , asleep   AEDs during EEG study: LEV   Technical aspects: This EEG study was done with scalp electrodes positioned according to the 10-20 International system of electrode placement. Electrical activity was reviewed with band pass filter of 1-70Hz , sensitivity of 7 uV/mm, display speed of 68mm/sec with a 60Hz  notched filter applied as appropriate. EEG data were recorded continuously and digitally stored.  Video monitoring was available and reviewed as appropriate.   Description: The posterior dominant rhythm consists of 7.5 Hz activity of moderate voltage (25-35 uV) seen predominantly in posterior head regions, asymmetric ( right<left) and reactive to eye opening and eye closing. Sleep was characterized by vertex waves, sleep spindles (12 to 14 Hz), maximal frontocentral region. EEG showed continuous 3 to 6 Hz theta-delta slowing right hemisphere, maximal right temporal region. Intermittent generalized 3-5hz  theta-delta slowing was also noted. Hyperventilation and photic stimulation were not performed.      ABNORMALITY - Continuous slow, right hemisphere - Intermittent slow, generalized   IMPRESSION: This study is suggestive of cortical dysfunction arising from right hemisphere likely due to underlying structural abnormality. Additionally there is mild to moderate diffuse encephalopathy. No definite seizures were seen throughout the recording.    Lydiana Milley O Sincerity Cedar

## 2024-07-22 NOTE — Plan of Care (Signed)
  Problem: Activity: Goal: Ability to tolerate increased activity will improve Outcome: Not Progressing   Problem: Role Relationship: Goal: Method of communication will improve Outcome: Not Progressing   Problem: Health Behavior/Discharge Planning: Goal: Ability to manage health-related needs will improve Outcome: Not Progressing

## 2024-07-22 NOTE — Assessment & Plan Note (Deleted)
 Febrile overnight to 101.2. Rhonchi heard on exam today.  - Covid and flu both negative - Vancomycin  and Zosyn  per pharmacy

## 2024-07-22 NOTE — Assessment & Plan Note (Signed)
 Normotensive at this time. Holding home Losartan . Discontinued hydralazine  9/12. Systolic <160.  - Coreg  12.5  mg BID

## 2024-07-22 NOTE — Assessment & Plan Note (Signed)
 Unable to sleep overnight, removing leads, required additional 12.5 mg of Seroquel  and eventually a waist belt restraint for patient's safety.  - Melatonin 3 mg at 6pm - Per neurology, will increase Seroquel  to 25 mg at 6 PM, with an additional Seroquel  12.5 at 8 PM if patient is not resting, we will also reschedule dexamethasone  as above

## 2024-07-22 NOTE — Progress Notes (Signed)
 Pt is restless, agitated, frequently wanting to get up on bed, informed MD Nygarrad, with ordered prn Seroquel , given.  0330 Complaint of headache, prn meds given, re informed Md Nygaard, but pt is still agitated and this time he is not following commands. Orders were made to placed Soft waist restraint and in person Sitter.

## 2024-07-22 NOTE — Plan of Care (Signed)
 FMTS Brief Progress Note  S: Evaluated patient at bedside.  Wife at bedside as well.  He complains of pain to his bottom.  States that the pain worsens with bowel movements.  Wife states that nursing has been applying Desitin to the area   O: BP 113/63 (BP Location: Right Arm)   Pulse 70   Temp 98.5 F (36.9 C)   Resp 20   Ht 5' 9 (1.753 m)   Wt 72.7 kg   SpO2 96%   BMI 23.67 kg/m   General: NAD Cardiovascular: RRR, no m/r/g Respiratory: normal work of breathing on RA, CTAB Abdomen: Normal bowel sounds, soft, non-tender Neuro: Able to conversate, alert and oriented to situation (did not specifically assess person, time, place)  A/P: GBM, seizure secondary to brain mass - plan for family meeting tomorrow 9/14 - medication regimen and plan per day team  Delirium - melatonin 3mg , seroquel  25mg  daily at 1800 - Seroquel  12.5mg  daily at 2000 - continue soft waist belt restraints - continue safety observation - rest of plan per day team  I have asked RN to take a picture of sacrum when she cleans pt if able, will plan for day team to assess area tomorrow  - Orders reviewed. Labs for AM ordered, which was adjusted as needed.   Stoney Blizzard, DO 07/22/2024, 9:38 PM PGY-2, [place] [facility] Night Resident  Please page [phone] with questions.

## 2024-07-22 NOTE — Assessment & Plan Note (Addendum)
 Palliative care consult in. Re-timing medications due to sundowning.  - Neurology consulted, recs appreciated - EEG showed no definitive seizures, cortical dysfunction from structural abnormality  - Restarted ASA 81 mg after discussing with NSGY  - Will restart Eliquis  on 9/16 - If any acute change in mental status since pt isn't anticoagulated, recommend CT head w/o contrast and CTA head and neck to r/o bleed/stroke - Re-time dexamethasone  to 8am, 1pm, and 6 pm. Re-time all other nightly medications to 6pm as well.  - Neurosurgery consulted, appreciate recommendations  - Palliative care consulted, appreciate assistance in patient's care - Keppra  1,250 mg BID - Dexamethasone  4 mg IV Q8H (messaged pharmacy about adjusting timing to hopefully decrease sundowning per neurology's recommendation) - Flomax  0.4 mg currently also has a urinary catheter, will attempt void trial on 9/14 - Pain regimen: acetaminophen  650 mg  - Increased Lantus  to 8 units QHS and SSI w/ steroids to maintain glucose 140-180 - Nausea regimen: Zofran  4 mg PRN - Delirium: Melatonin 3 mg at 6pm and Seroquel  12.5 mg at 6 pm to minimize delirum/sundowing  - Continuous cardiac monitoring - Neuro checks Q4H - PT/OT/SLP consulted, appreciate recommendations - Fall and seizure precautions - AM Labs: BMP with GFR, CBC

## 2024-07-22 NOTE — Assessment & Plan Note (Addendum)
 Afebrile. Lung sounds improved this morning. Normal work of breathing.  - Covid and flu both negative - Vancomycin  and Zosyn  per pharmacy

## 2024-07-22 NOTE — Plan of Care (Addendum)
   PMT initially received consult for this patient on 9/12 which was subsequently discontinued. Medical records were reviewed.   Recommend outpatient palliative care referral to [facility] palliative clinic for support.   Thank you for your referral and allowing PMT to assist in Mr. Wayne Molina's care.   Magdelyn Roebuck, PA-C Palliative Medicine Team  Team Phone # [phone]   NO CHARGE   Addendum: received notification from primary team that consult should not have been discontinued and PMT involvement during admission would be beneficial.   Family meeting has been scheduled for 9/14 at 1:00 pm with patient's wife.

## 2024-07-22 NOTE — Progress Notes (Signed)
MB performed maintenance on electrodes. All impedances are below 10k ohms. No skin breakdown noted.  

## 2024-07-22 NOTE — Assessment & Plan Note (Signed)
 HLD - continue home Atorvastatin  40mg  daily

## 2024-07-22 NOTE — Plan of Care (Signed)
 FMTS Interim Progress Note  Patient assessed at bedside given ongoing agitation. Patient in waist restraint on his side with stool on bedding. Mumbling some coherent sentence but also lots of nonsensical mumbling. Able to state wife's name and place.   Per wife he was very difficult to redirect overnight. RN expressed concern about needing sedating medication given inability to stay in bed with difficulty redirecting for a majority of the time overnight.  O: BP 129/74 (BP Location: Right Arm)   Pulse 88   Temp 97.6 F (36.4 C) (Oral)   Resp 18   Ht 5' 9 (1.753 m)   Wt 72.7 kg   SpO2 100%   BMI 23.67 kg/m    General: Patient laying with waist belt restraint in place, alert Neuro: Moving all extremities spontaneously.  Alert and oriented to self and place.  Able to respond to some questions but additionally had some nonsensical speech and statements.   A/P: Glioblastoma with secondary seizures Delirium/sundowning Unfortunately had difficulty with agitation/getting out of bed despite redirection overnight and became worse to self.  Received spot dose Seroquel  12.5 mg with some benefit for a few hours per wife.  Symptoms recurred and required waist belt restraint.  Discussed with wife and RN team the need to avoid heavily sedating medications giving waxing waning mental status and need to assess if acute neurologic change presents.  Plan to discuss with day team the need to develop overnight plan and possible increased Seroquel  and discussion with neurology.  Theophilus Pagan, MD 07/22/2024, 7:14 AM PGY-3, [facility] Family Medicine Service pager [phone]

## 2024-07-22 NOTE — Assessment & Plan Note (Signed)
 Smallbore feeding tube left nare. - Feeding supplement (Osmolite 1.5 Cal) liquid 50 mL/hr continuous until discontinued - Feeding supplement (Prosource TF 20) liquid 60 mL daily - Fiber supplement (Banatrol TF) liquid 60 mL BID - Mupirocin  ointment 2% twice daily - Protonix  40 mg daily

## 2024-07-22 NOTE — Plan of Care (Signed)
  Problem: Education: Goal: Knowledge of General Education information will improve Description: Including pain rating scale, medication(s)/side effects and non-pharmacologic comfort measures Outcome: Progressing   Problem: Activity: Goal: Ability to tolerate increased activity will improve Outcome: Progressing   Problem: Clinical Measurements: Goal: Will remain free from infection Outcome: Progressing   Problem: Clinical Measurements: Goal: Diagnostic test results will improve Outcome: Progressing   Problem: Activity: Goal: Risk for activity intolerance will decrease Outcome: Progressing   Problem: Nutrition: Goal: Adequate nutrition will be maintained Outcome: Progressing   Problem: Coping: Goal: Level of anxiety will decrease Outcome: Progressing   Problem: Elimination: Goal: Will not experience complications related to bowel motility Outcome: Progressing   Problem: Elimination: Goal: Will not experience complications related to urinary retention Outcome: Progressing   Problem: Pain Managment: Goal: General experience of comfort will improve and/or be controlled Outcome: Progressing   Problem: Safety: Goal: Ability to remain free from injury will improve Outcome: Progressing   Problem: Skin Integrity: Goal: Risk for impaired skin integrity will decrease Outcome: Progressing

## 2024-07-22 NOTE — Assessment & Plan Note (Addendum)
 Rate controlled.  Currently holding patient's Eliquis  s/p R craniotomy with tumor biopsy on 9/8. - ASA 81 mg per neuro for bridge while off anticoagulation for 7 days s/p biopsy, likely will restart Eliquis  on 9/16. - Will increase Coreg  to 12.5 twice daily today  - EKG: A-fib, ST segment changes, similar to prior earlier this morning

## 2024-07-22 NOTE — Progress Notes (Signed)
 Daily Progress Note Intern Pager: [phone]  Patient name: Wayne Molina Medical record number: [id number] Date of birth: Aug 29, 1956 Age: 68 y.o. Gender: male  Primary Care Provider: Janna Ferrier, DO Consultants: Neurosurgery, Neurology, Oncology,  Code Status: Full  Pt Overview and Major Events to Date:  8/29: Admitted, focal seizure in the ED, intubated and sent to the ICU 9/4: extubated 9/8: Neurosurgery completed open craniotomy for tumor biopsy   Medical Decision Making:  Wayne Molina is a 68 year old male with a PMH of HTN, HLD, A-fib, not currently on anticoagulation, OSA and osteoporosis who presented with L sided neurodeficits and found to have brain tumor, biopsy on 9/8, surgical pathology reveals grade 4 glioma.  Discussed results with neurology and neurosurgery, who have both consulted neuro-oncology.  Patient currently on vancomycin  and Zosyn  for HAP.  Patient with continued sundowning overnight.  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 Palliative care consult in. Re-timing medications due to sundowning.  - Neurology consulted, recs appreciated - EEG showed no definitive seizures, cortical dysfunction from structural abnormality  - Restarted ASA 81 mg after discussing with NSGY  - Will restart Eliquis  on 9/16 - If any acute change in mental status since pt isn't anticoagulated, recommend CT head w/o contrast and CTA head and neck to r/o bleed/stroke - Re-time dexamethasone  to 8am, 1pm, and 6 pm. Re-time all other nightly medications to 6pm as well.  - Neurosurgery consulted, appreciate recommendations  - Palliative care consulted, appreciate assistance in patient's care - Keppra  1,250 mg BID - Dexamethasone  4 mg IV Q8H (messaged pharmacy about adjusting timing to hopefully decrease sundowning per neurology's recommendation) - Flomax  0.4 mg currently also has a urinary catheter, will attempt void trial on 9/14 -  Pain regimen: acetaminophen  650 mg  - Increased Lantus  to 8 units QHS and SSI w/ steroids to maintain glucose 140-180 - Nausea regimen: Zofran  4 mg PRN - Delirium: Melatonin 3 mg at 6pm and Seroquel  12.5 mg at 6 pm to minimize delirum/sundowing  - Continuous cardiac monitoring - Neuro checks Q4H - PT/OT/SLP consulted, appreciate recommendations - Fall and seizure precautions - AM Labs: BMP with GFR, CBC Fever HAP (hospital-acquired pneumonia) Afebrile. Lung sounds improved this morning. Normal work of breathing.  - Covid and flu both negative - Vancomycin  and Zosyn  per pharmacy  Sundowning Unable to sleep overnight, removing leads, required additional 12.5 mg of Seroquel  and eventually a waist belt restraint for patient's safety.  - Melatonin 3 mg at 6pm - Per neurology, will increase Seroquel  to 25 mg at 6 PM, with an additional Seroquel  12.5 at 8 PM if patient is not resting, we will also reschedule dexamethasone  as above Atrial fibrillation with RVR (HCC) Rate controlled.  Currently holding patient's Eliquis  s/p R craniotomy with tumor biopsy on 9/8. - ASA 81 mg per neuro for bridge while off anticoagulation for 7 days s/p biopsy, likely will restart Eliquis  on 9/16. - Will increase Coreg  to 12.5 twice daily today  - EKG: A-fib, ST segment changes, similar to prior earlier this morning Hypertension Normotensive at this time. Holding home Losartan . Discontinued hydralazine  9/12. Systolic <160.  - Coreg  12.5  mg BID  Malnutrition of moderate degree Smallbore feeding tube left nare. - Feeding supplement (Osmolite 1.5 Cal) liquid 50 mL/hr continuous until discontinued - Feeding supplement (Prosource TF 20) liquid 60 mL daily - Fiber supplement (Banatrol TF) liquid 60 mL BID - Mupirocin  ointment 2% twice daily - Protonix   40 mg daily Chronic health problem HLD - continue home Atorvastatin  40mg  daily    FEN/GI: Dysphagia 3  PPx: SCDs Dispo: Home pending clinical improvement.    Subjective:  Patient seen at bedside with wife, brother, sister-in-law all sitting in room.  Sitter is present as well.  Patient is awake and easily arousable to voice, but drifts off to sleep a few times throughout the encounter.  He speaks some but it is mostly jumbled and difficult to understand, which is not new.  Per wife, patient got up multiple times last night and was just wide-awake.  She states he is sleeping more during the day.  He had a headache overnight as well.  He is resting comfortably this morning, had told family that he was hungry and his brother ordered food for him.  He has not eaten over the last 2 days.  Patient is breathing much better this morning per wife.  Objective: Temp:  [97.5 F (36.4 C)-98.6 F (37 C)] 97.6 F (36.4 C) (09/13 0333) Pulse Rate:  [80-92] 83 (09/13 0717) Resp:  [18-20] 18 (09/13 0717) BP: (100-133)/(58-78) 123/76 (09/13 0717) SpO2:  [94 %-100 %] 98 % (09/13 0717) Weight:  [72.7 kg] 72.7 kg (09/13 0500) Physical Exam: General: Ill-appearing male, NAD Cardiovascular: RRR, no M/R/G Respiratory: CTAB, normal work of breathing on room air Abdomen: Soft, nondistended, no rebound, no guarding Extremities: TED hose and SCD in place  Laboratory: Most recent CBC Lab Results  Component Value Date   WBC 43.7 (H) 07/22/2024   HGB 13.1 07/22/2024   HCT 38.6 (L) 07/22/2024   MCV 91.3 07/22/2024   PLT 401 (H) 07/22/2024   Most recent BMP    Latest Ref Rng & Units 07/22/2024    2:11 AM  BMP  Glucose 70 - 99 mg/dL 753   BUN 8 - 23 mg/dL 27   Creatinine 9.38 - 1.24 mg/dL 9.19   Sodium 864 - 854 mmol/L 131   Potassium 3.5 - 5.1 mmol/L 4.5   Chloride 98 - 111 mmol/L 101   CO2 22 - 32 mmol/L 22   Calcium  8.9 - 10.3 mg/dL 8.6    Imaging/Diagnostic Tests: EKG: regular rate, A-fib    Baker, Raguel MATSU, DO 07/22/2024, 7:51 AM  PGY-1, [place] [facility] FPTS Intern pager: [phone], text pages welcome Secure chat group [facility] Teaching Service

## 2024-07-23 ENCOUNTER — Inpatient Hospital Stay (HOSPITAL_COMMUNITY)

## 2024-07-23 DIAGNOSIS — Z7189 Other specified counseling: Secondary | ICD-10-CM | POA: Diagnosis not present

## 2024-07-23 DIAGNOSIS — Z515 Encounter for palliative care: Secondary | ICD-10-CM | POA: Diagnosis not present

## 2024-07-23 DIAGNOSIS — F05 Delirium due to known physiological condition: Secondary | ICD-10-CM

## 2024-07-23 DIAGNOSIS — E440 Moderate protein-calorie malnutrition: Secondary | ICD-10-CM

## 2024-07-23 DIAGNOSIS — M79661 Pain in right lower leg: Secondary | ICD-10-CM | POA: Diagnosis not present

## 2024-07-23 DIAGNOSIS — S31809A Unspecified open wound of unspecified buttock, initial encounter: Secondary | ICD-10-CM | POA: Insufficient documentation

## 2024-07-23 DIAGNOSIS — J189 Pneumonia, unspecified organism: Secondary | ICD-10-CM

## 2024-07-23 DIAGNOSIS — R569 Unspecified convulsions: Secondary | ICD-10-CM | POA: Diagnosis not present

## 2024-07-23 DIAGNOSIS — C719 Malignant neoplasm of brain, unspecified: Secondary | ICD-10-CM | POA: Diagnosis not present

## 2024-07-23 LAB — C DIFFICILE (CDIFF) QUICK SCRN (NO PCR REFLEX)
C Diff antigen: NEGATIVE
C Diff interpretation: NOT DETECTED
C Diff toxin: NEGATIVE

## 2024-07-23 LAB — GLUCOSE, CAPILLARY
Glucose-Capillary: 244 mg/dL — ABNORMAL HIGH (ref 70–99)
Glucose-Capillary: 200 mg/dL — ABNORMAL HIGH (ref 70–99)
Glucose-Capillary: 176 mg/dL — ABNORMAL HIGH (ref 70–99)
Glucose-Capillary: 158 mg/dL — ABNORMAL HIGH (ref 70–99)
Glucose-Capillary: 159 mg/dL — ABNORMAL HIGH (ref 70–99)
Glucose-Capillary: 263 mg/dL — ABNORMAL HIGH (ref 70–99)
Glucose-Capillary: 192 mg/dL — ABNORMAL HIGH (ref 70–99)

## 2024-07-23 LAB — BASIC METABOLIC PANEL WITH GFR
Anion gap: 11 (ref 5–15)
BUN: 27 mg/dL — ABNORMAL HIGH (ref 8–23)
CO2: 25 mmol/L (ref 22–32)
Calcium: 8.7 mg/dL — ABNORMAL LOW (ref 8.9–10.3)
Chloride: 99 mmol/L (ref 98–111)
Creatinine, Ser: 0.79 mg/dL (ref 0.61–1.24)
GFR, Estimated: >60 mL/min (ref >60)
Glucose, Bld: 158 mg/dL — ABNORMAL HIGH (ref 70–99)
Potassium: 4.3 mmol/L (ref 3.5–5.1)
Sodium: 135 mmol/L (ref 135–145)

## 2024-07-23 LAB — CBC
HCT: 42 % (ref 39.0–52.0)
Hemoglobin: 14.2 g/dL (ref 13.0–17.0)
MCH: 30.7 pg (ref 26.0–34.0)
MCHC: 33.8 g/dL (ref 30.0–36.0)
MCV: 90.7 fL (ref 80.0–100.0)
Platelets: 525 K/uL — ABNORMAL HIGH (ref 150–400)
RBC: 4.63 MIL/uL (ref 4.22–5.81)
RDW: 12.6 % (ref 11.5–15.5)
WBC: 24.5 K/uL — ABNORMAL HIGH (ref 4.0–10.5)
nRBC: 0 % (ref 0.0–0.2)

## 2024-07-23 MED ORDER — OXYCODONE HCL 5 MG PO TABS
2.5000 mg | ORAL_TABLET | Freq: Once | ORAL | Status: AC
  Administered 2024-07-23: 2.5 mg via ORAL
  Filled 2024-07-23: qty 1

## 2024-07-23 MED ORDER — CLOTRIMAZOLE 1 % EX CREA
TOPICAL_CREAM | Freq: Two times a day (BID) | CUTANEOUS | Status: DC
  Administered 2024-07-25 – 2024-07-30 (×2): 1 via TOPICAL
  Filled 2024-07-23: qty 15

## 2024-07-23 MED ORDER — CLOTRIMAZOLE 1 % EX CREA
TOPICAL_CREAM | Freq: Two times a day (BID) | CUTANEOUS | Status: DC
  Filled 2024-07-23: qty 15

## 2024-07-23 MED ORDER — OXYCODONE HCL 5 MG PO TABS
2.5000 mg | ORAL_TABLET | ORAL | Status: DC | PRN
  Administered 2024-07-23 (×2): 2.5 mg via ORAL
  Filled 2024-07-23: qty 1

## 2024-07-23 MED ORDER — OXYCODONE HCL 5 MG PO TABS
5.0000 mg | ORAL_TABLET | ORAL | Status: DC | PRN
  Administered 2024-07-24 – 2024-07-30 (×7): 5 mg via ORAL
  Filled 2024-07-23 (×8): qty 1

## 2024-07-23 MED ORDER — ACETAMINOPHEN 500 MG PO TABS
1000.0000 mg | ORAL_TABLET | Freq: Four times a day (QID) | ORAL | Status: DC
  Administered 2024-07-23 – 2024-08-03 (×35): 1000 mg via ORAL
  Filled 2024-07-23 (×42): qty 2

## 2024-07-23 MED ORDER — GERHARDT'S BUTT CREAM
TOPICAL_CREAM | Freq: Three times a day (TID) | CUTANEOUS | Status: DC
  Administered 2024-07-25 – 2024-07-30 (×2): 1 via TOPICAL
  Filled 2024-07-23 (×5): qty 60

## 2024-07-23 NOTE — Assessment & Plan Note (Addendum)
 As seen on photos from 9/14 AM.  Likely from prolonged immobility and moisture.  Satellite lesions suggestive of fungal etiology. -Continue clotrimazole  cream twice daily -Continue to turn patient every 2 hours - WOC consult

## 2024-07-23 NOTE — Assessment & Plan Note (Addendum)
 Normotensive at this time. Holding home Losartan . Discontinued hydralazine  9/12. Systolic <160.  - Coreg  12.5  mg BID

## 2024-07-23 NOTE — Consult Note (Signed)
 Consultation Note Date: 07/23/2024   Patient Name: Wayne Molina  DOB: [birth year]  MRN: [id number]  Age / Sex: 68 y.o., male  PCP: Janna Ferrier, DO Referring Physician: Delores Suzann HERO, MD  Reason for Consultation: Establishing goals of care  Per intake->HPI/Patient Profile: 68 y.o. male  with past medical history of atrial fibrillation, depression/anxiety, hypertension, hyperlipidemia, atypical variant hypertrophic cardiomyopathy, OSA, osteoporosis admitted on 07/06/2024 with weakness of left arm and slurred speech.   I nitially brought in as code stroke in the emergency room, CTA negative for large vessel occlusion and suggestive of possible right MCA stroke. Repeat head CT was obtained in the ED due to AMS and eye twitching, suggestive of mass. He then received lorazepam  for seizure-like activity, subsequently intubated for airway protection and transferred to the intensive care unit. 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 Hospitalization further complicated by fevers, RLE DVT, HAP, afib/RVR, malnutrition requiring cortrak placement, sundowning.   PMT has been consulted to assist with goals of care conversation.  Clinical Assessment and Goals of Care:  I have reviewed medical records including EPIC notes, labs and imaging, assessed the patient and then met at the bedside with patient's wife, daughter, step-daughter, brother, sister-in-law, and niece by phone to discuss diagnosis prognosis, GOC, EOL wishes, disposition and options.  I introduced Palliative Medicine as specialized medical care for people living with serious illness. It focuses on providing relief from the symptoms and stress of a serious illness. The goal is to improve quality of life for both the patient and the family.  We discussed a brief life review of the patient and then focused on their current illness.   I attempted to elicit values and  goals of care important to the patient.    Medical History Review and Understanding:  Patient's primary attending was present for medical update and we discussed patient's acute illness in the context of their chronic comorbidities. Patient's family understand the severity of patient's illness.  Social History: Patient has been married to [place] for 4 months after being together for 10 years. He has a daughter and step-daughter, well supported by his family. He works for [employer] in [employer]. Previously enjoyed being very active.  Functional and Nutritional State: Patient was completely functional independently prior to this acute decline. Albumin of 2.7 on 9/2.  Palliative Symptoms: Pain on his bottom, secretions  Advance Directives: A detailed discussion regarding advanced directives was had. No documentation is currently on file.   Code Status: Concepts specific to code status, artifical feeding and hydration, and rehospitalization were considered and discussed. Recommended consideration of DNR status, understanding evidenced-based poor outcomes in similar hospitalized patients, as the cause of the arrest is likely associated with chronic/terminal disease rather than a reversible acute cardio-pulmonary event.  Discussion: Provided emotional support and therapeutic listening as patient and family reflected on this life-changing and devastating sudden change in patient's health with diagnosis of GBM. 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 He hopes for improvement of his pain. He does not want mechanical ventilation or CPR if he declines despite medical treatments, preferring a peaceful and natural death at that time. He would not  want long-term artificial nutrition given  how he is having such a hard time with his temporary tube feeds. He is a man of faith and finds strength in knowing that God has a plan for him and will help him get through this.    The difference between aggressive medical intervention and comfort care was considered in light of the patient's goals of care.   Discussed the importance of continued conversation with family and the medical providers regarding overall plan of care and treatment options, ensuring decisions are within the context of the patient's values and GOCs.   Questions and concerns were addressed.  Hard Choices booklet left for review. The family was encouraged to call with questions or concerns.  PMT will continue to support holistically.   SUMMARY OF RECOMMENDATIONS   -Code status changed to DNR/DNI -Continue full scope treatment otherwise -Ongoing goals of care discussions pending clinical course -Introduced advance directives and recommended consideration of completion during hospital admission -Psychosocial and emotional support provided -PMT will continue to follow and support  Prognosis:  Poor with highly aggressive BGM and several acute complications  Discharge Planning: To Be Determined      Primary Diagnoses: Present on Admission:  Acute encephalopathy  Brain tumor (HCC)  Hypertension  Atrial fibrillation with RVR (HCC)    Physical Exam Vitals and nursing note reviewed.  Constitutional:      Appearance: He is ill-appearing.     Comments: Cortrak in place  HENT:     Head: Normocephalic and atraumatic.  Cardiovascular:     Rate and Rhythm: Normal rate.  Pulmonary:     Effort: Pulmonary effort is normal.  Skin:    General: Skin is warm and dry.  Neurological:     Mental Status: He is alert.  Psychiatric:        Mood and Affect: Affect is tearful.        Speech: Speech is delayed.        Behavior: Behavior is cooperative.     Vital Signs: BP  127/76 (BP Location: Left Arm)   Pulse 82   Temp 97.9 F (36.6 C) (Oral)   Resp 18   Ht 5' 9 (1.753 m)   Wt 73.7 kg   SpO2 99%   BMI 23.99 kg/m  Pain Scale: 0-10   Pain Score: 5    SpO2: SpO2: 99 % O2 Device:SpO2: 99 % O2 Flow Rate: .O2 Flow Rate (L/min): 5 L/min    Makail Watling SHAUNNA Fell, PA-C  Palliative Medicine Team Team phone # [phone]  Thank you for allowing the Palliative Medicine Team to assist in the care of this patient. Please utilize secure chat with additional questions, if there is no response within 30 minutes please call the above phone number.  Palliative Medicine Team providers are available by phone from 7am to 7pm daily and can be reached through the team cell phone.  Should this patient require assistance outside of these hours, please call the patient's attending physician.   Billing based on MDM: High  Problems Addressed: One acute or chronic illness or injury that poses a threat to life or bodily function  Amount and/or Complexity of Data: Category 1:Review of prior external note(s) from each unique source, Review of the result(s) of each unique test, and Assessment requiring an independent historian(s), Category 2:Independent interpretation of a test performed by another physician/other qualified health care professional (not separately reported), and Category 3:Discussion of management or test interpretation with external physician/other qualified health care professional/appropriate source (not separately reported)  Risks: Decision not to resuscitate or to de-escalate care because of poor prognosis

## 2024-07-23 NOTE — Assessment & Plan Note (Addendum)
 Afebrile and breathing comfortably on room air.  Some concern for hospital-acquired pneumonia given patient's long hospital stay.  He does have a chronic indwelling urinary catheter, and while unlikely cause of the fever, can consider void trial later today. - Covid and flu both negative - Discontinue vancomycin  as MRSA negative, will complete 5 days of Zosyn  (end 9/16)  - Flomax  0.4 mg currently also has a urinary catheter, consider attempt void trial on 9/15

## 2024-07-23 NOTE — Assessment & Plan Note (Addendum)
 Palliative care to have family meeting today at 1 PM.  Remains intermittently agitated though improved with Seroquel  yesterday.  EEG this morning with findings compatible with structural abnormality. - Neurology consulted, recs appreciated  - Restarted ASA 81 mg after discussing with NSGY  - Will restart Eliquis  on 9/16 - If any acute change in mental status since pt isn't anticoagulated, recommend CT head w/o contrast and CTA head and neck to r/o bleed/stroke - Continue dexamethasone  to 8am, 1pm, and 6 pm along with other nightly medications at 6 PM - Neurosurgery consulted, appreciate recommendations  - Palliative care consulted, appreciate assistance in patient's care, follow-up after family meeting today - Keppra  1,000 mg BID - Dexamethasone  4 mg IV Q8H - Pain regimen: acetaminophen  650 mg (will increase to 1000 mg every 6 hours), consider addition of Toradol  every 6 hours, try to avoid sedating pain medications if possible - Increased Lantus  to 8 units QHS and SSI w/ steroids to maintain glucose 140-180 - Nausea regimen: Zofran  4 mg PRN - Delirium: Melatonin 3 mg at 6pm and Seroquel  12.5 mg at 6 pm to minimize delirum/sundowing  - Continuous cardiac monitoring - Neuro checks Q4H - PT/OT/SLP consulted, appreciate recommendations - Fall and seizure precautions - AM Labs: BMP with GFR, CBC

## 2024-07-23 NOTE — Assessment & Plan Note (Addendum)
 Rate controlled.  Currently holding patient's Eliquis  s/p R craniotomy with tumor biopsy on 9/8. - ASA 81 mg per neuro for bridge while off anticoagulation for 7 days s/p biopsy, likely will restart Eliquis  on 9/16. - Continue Coreg  to 12.5 twice daily\

## 2024-07-23 NOTE — Progress Notes (Addendum)
 Daily Progress Note Intern Pager: [phone]  Patient name: Wayne Molina Medical record number: [id number] Date of birth: September 04, 1956 Age: 68 y.o. Gender: male  Primary Care Provider: Janna Ferrier, DO Consultants: Neurosurgery, neurology, oncology Code Status: Full  Pt Overview and Major Events to Date:  8/29-admitted, focal seizure in ED, intubated to the ICU 9/4-extubated 9/8-neurosurgery completed open craniotomy for tumor biopsy and confirmed glioblastoma  Medical Decision Making:  Wayne Molina is a 68 year old male with a PMH of HTN, HLD, A-fib not currently on anticoagulation, OSA, and osteoporosis admitted after a seizure and found to have glioblastoma multiforme. 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  Remains intermittently agitated though improved with Seroquel  yesterday.  EEG this morning with findings compatible with structural abnormality. - Neurology consulted, recs appreciated  - Restarted ASA 81 mg after discussing with NSGY  - Will restart Eliquis  on 9/16 - If any acute change in mental status since pt isn't anticoagulated, recommend CT head w/o contrast and CTA head and neck to r/o bleed/stroke - Continue dexamethasone  to 8am, 1pm, and 6 pm along with other nightly medications at 6 PM - Neurosurgery consulted, appreciate recommendations  - Palliative care consulted, appreciate assistance in patient's care, follow-up after family meeting today - Keppra  1,000 mg BID - Dexamethasone  4 mg IV Q8H - Pain regimen: acetaminophen  650 mg (will increase to 1000 mg every 6 hours), consider addition of Toradol  every 6 hours, try to avoid sedating pain medications if possible - Increased Lantus  to 8 units QHS and SSI w/ steroids to maintain glucose 140-180 - Nausea regimen: Zofran  4 mg PRN - Delirium: Melatonin 3 mg at 6pm and Seroquel  12.5 mg at 6 pm to minimize  delirum/sundowing  - Continuous cardiac monitoring - Neuro checks Q4H - PT/OT/SLP consulted, appreciate recommendations - Fall and seizure precautions - AM Labs: BMP with GFR, CBC Fever HAP (hospital-acquired pneumonia) Afebrile and breathing comfortably on room air.  Some concern for hospital-acquired pneumonia given patient's long hospital stay.  He does have a chronic indwelling urinary catheter, and while unlikely cause of the fever, can consider void trial later today. - Covid and flu both negative - Discontinue vancomycin  as MRSA negative, will complete 5 days of Zosyn  (end 9/16)  - Flomax  0.4 mg currently also has a urinary catheter, consider attempt void trial on 9/15 Sundowning Improved status post Seroquel  dosing as below. - Melatonin 3 mg at 6pm - Per neurology, will increase Seroquel  to 25 mg at 6 PM, with an additional Seroquel  12.5 at 8 PM if patient is not resting - Discontinued sitter and restraints  Atrial fibrillation with RVR (HCC) Rate controlled.  Currently holding patient's Eliquis  s/p R craniotomy with tumor biopsy on 9/8. - ASA 81 mg per neuro for bridge while off anticoagulation for 7 days s/p biopsy, likely will restart Eliquis  on 9/16. - Continue Coreg  to 12.5 twice daily\ Hypertension Normotensive at this time. Holding home Losartan . Discontinued hydralazine  9/12. Systolic <160.  - Coreg  12.5  mg BID  Malnutrition of moderate degree Smallbore feeding tube left nare.  Continue to follow goals of care discussions as above with palliative. - Feeding supplement (Osmolite 1.5 Cal) liquid 50 mL/hr continuous until discontinued - Feeding supplement (Prosource TF 20) liquid 60 mL daily - Fiber supplement (Banatrol TF) liquid 60 mL BID - Mupirocin  ointment 2% twice daily - Protonix  40 mg daily Wound of  gluteal cleft As seen on photos from 9/14 AM.  Likely from prolonged immobility and moisture.  Satellite lesions suggestive of fungal etiology. -Continue  clotrimazole  cream twice daily -Continue to turn patient every 2 hours - WOC consult  Chronic health problem HLD - continue home Atorvastatin  40mg  daily  FEN/GI: Dysphagia 3 diet PPx: SCDs, plan to restart Eliquis  on 9/16 after period of aspirin  use status postbiopsy Dispo:Pending conversation with palliative care and improvement   Subjective:  Overall this morning with multiple sources of pain: Head, legs, abdomen.  Of note, wife states he did sleep better last night and was not as confused.  Objective: Temp:  [97.5 F (36.4 C)-98.5 F (36.9 C)] 97.9 F (36.6 C) (09/14 0759) Pulse Rate:  [63-90] 82 (09/14 0759) Resp:  [16-20] 18 (09/14 0759) BP: (95-137)/(54-86) 127/76 (09/14 0759) SpO2:  [95 %-100 %] 99 % (09/14 0759) Weight:  [73.7 kg] 73.7 kg (09/14 0500) Physical Exam: General: Alert and oriented x 4, no acute distress, baseline dysarthric, NG tube in place Cardiovascular: Regular rate and rhythm without murmurs rubs or gallops Respiratory: Clear to auscultation bilaterally without wheezes rales or rhonchi Abdomen: Soft, nontender, nondistended, normoactive bowel sounds Extremities: Moves all extremities grossly equally  Laboratory: Most recent CBC Lab Results  Component Value Date   WBC 24.5 (H) 07/23/2024   HGB 14.2 07/23/2024   HCT 42.0 07/23/2024   MCV 90.7 07/23/2024   PLT 525 (H) 07/23/2024   Most recent BMP    Latest Ref Rng & Units 07/23/2024    6:33 AM  BMP  Glucose 70 - 99 mg/dL 841   BUN 8 - 23 mg/dL 27   Creatinine 9.38 - 1.24 mg/dL 9.20   Sodium 864 - 854 mmol/L 135   Potassium 3.5 - 5.1 mmol/L 4.3   Chloride 98 - 111 mmol/L 99   CO2 22 - 32 mmol/L 25   Calcium  8.9 - 10.3 mg/dL 8.7     Tharon Lung, MD 07/23/2024, 8:05 AM  PGY-3, [facility] FPTS Intern pager: [phone], text pages welcome Secure chat group [facility] Teaching Service

## 2024-07-23 NOTE — Plan of Care (Signed)
 CT head without contrast demonstrates overall stable findings without concerning hemorrhage per neurosurgery, appreciate their input.  Spoke with wife and stepdaughter about options to continue anticoagulation given his risk of stroke with A-fib as well as his newly found DVT.  We also discussed the risk of starting anticoagulation and intracranial bleeding.  Wife and stepdaughter would like to continue treatment as is for now while he speak to other family members before making a decision, likely tomorrow.

## 2024-07-23 NOTE — Progress Notes (Signed)
 FMTS Interim Progress Note In with family to discuss plan of care and goals of care. Palliative Medicine Team, Josseline Cooper, at meeting as well. Wife, daughter, brother, and other family members present. Discussed findings of ultrasound, current care status, and next steps.  RLE Distal DVT  - Discussed distal DVT with family. Also discussed with Neurosurgery and Neurology in context of recent biopsy. - Repeat non-contrast head today and will restart eliquis  (without loading dose most likely) pending results - If worsening bleeding at site will discuss IVC filter  Goals of Care During family meeting confirmed - DNR status - Patient would not desire long term tube feeds  Pain Control  - Will optimize pain medications, ask about air overlay mattress - Start cholestyramine for tube feed related bowel movements    Suzann Daring, MD  Family Medicine Teaching Service

## 2024-07-23 NOTE — Assessment & Plan Note (Addendum)
 HLD - continue home Atorvastatin  40mg  daily

## 2024-07-23 NOTE — Assessment & Plan Note (Addendum)
 Improved status post Seroquel  dosing as below. - Melatonin 3 mg at 6pm - Per neurology, will increase Seroquel  to 25 mg at 6 PM, with an additional Seroquel  12.5 at 8 PM if patient is not resting - Discontinued sitter and restraints

## 2024-07-23 NOTE — Plan of Care (Signed)
 Spoke with Dr. Luiz concerning C. Diff testing for this patient given prolonged hospital stay, multiple rounds of antibiotics, and recent fever with abdominal pain and multiple loose stools (5 yesterday). She will place order. Appreciate her assistance in the care of this patient. Can consider cholestyramine for diarrhea after testing is completed and is negative.

## 2024-07-23 NOTE — Progress Notes (Signed)
 BLE venous duplex has been completed.   Results can be found under chart review under CV PROC. 07/23/2024 1:57 PM Esco Joslyn RVT, RDMS

## 2024-07-23 NOTE — Consult Note (Signed)
 WOC Nurse Consult Note: Reason for Consult: buttocks  Wound type: 1. Moisture Associated Skin Damage sacrum/buttocks/coccyx  ICD-10 CM Codes for Irritant Dermatitis L24A2 - Due to fecal, urinary or dual incontinence 2.  Intertriginous dermatitis groin/inner thighs/scrotum  L30.4  - Erythema intertrigo. Also used for abrasion of the hand, chafing of the skin, dermatitis due to sweating and friction, friction dermatitis, friction eczema, and genital/thigh intertrigo.  Pressure Injury POA: NA not pressure  Measurement: widespread to buttocks/scrotum/inner thighs  Wound bed: erythema with scattered partial thickness skin loss and ? Satellite lesions indicating fungal component  Drainage (amount, consistency, odor)  Periwound: Dressing procedure/placement/frequency: Cleanse buttocks/inner thighs/scrotum with Vashe wound cleanser, do not rinse and allow to air dry. Apply Gerhardt's Butt Cream to entire area 3 times daily and prn soiling. May sprinkle over Gerhardt's with floor stock antifungal powder (green and white label Microguard) for extra drying effect.   POC discussed with bedside nurse. WOC team will not follow. Re-consult if further needs arise.   Thank you,    Powell Bar MSN, RN-BC, [employer]

## 2024-07-23 NOTE — Procedures (Addendum)
 Patient Name: Wayne Molina  MRN: [id number]  Epilepsy Attending: Arlin MALVA Krebs  Referring Physician/Provider: Khaliqdina, Salman, MD  Duration: 07/22/2024 1227 to 07/23/2024 0741   Patient history: 68 y.o. 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 EEG to evaluate for seizure   Level of alertness: awake/ lethargic , asleep   AEDs during EEG study: LEV   Technical aspects: This EEG study was done with scalp electrodes positioned according to the 10-20 International system of electrode placement. Electrical activity was reviewed with band pass filter of 1-70Hz , sensitivity of 7 uV/mm, display speed of 33mm/sec with a 60Hz  notched filter applied as appropriate. EEG data were recorded continuously and digitally stored.  Video monitoring was available and reviewed as appropriate.   Description: The posterior dominant rhythm consists of 7.5 Hz activity of moderate voltage (25-35 uV) seen predominantly in posterior head regions, asymmetric ( right<left) and reactive to eye opening and eye closing. Sleep was characterized by vertex waves, sleep spindles (12 to 14 Hz), maximal frontocentral region. EEG showed continuous 3 to 6 Hz theta-delta slowing right hemisphere, maximal right temporal region. Intermittent generalized 3-5hz  theta-delta slowing was also noted. Hyperventilation and photic stimulation were not performed.      ABNORMALITY - Continuous slow, right hemisphere - Intermittent slow, generalized   IMPRESSION: This study is suggestive of cortical dysfunction arising from right hemisphere likely due to underlying structural abnormality. Additionally there is mild to moderate diffuse encephalopathy. No definite seizures were seen throughout the recording.    Aritzel Krusemark O Marilynne Dupuis

## 2024-07-23 NOTE — Progress Notes (Signed)
 LTM EEG disconnected - no skin breakdown at [facility]. [facility] notified.

## 2024-07-23 NOTE — Plan of Care (Signed)
 Pt sometimes still wanting to get up on bd but he can be now redirectable, complaint of pain in the sacral, applied cream on it prn. Still on soft waist restraints.  Problem: Respiratory: Goal: Ability to maintain a clear airway and adequate ventilation will improve Outcome: Progressing   Problem: Activity: Goal: Ability to tolerate increased activity will improve Outcome: Progressing   Problem: Health Behavior/Discharge Planning: Goal: Ability to manage health-related needs will improve Outcome: Progressing   Problem: Clinical Measurements: Goal: Will remain free from infection Outcome: Progressing   Problem: Activity: Goal: Risk for activity intolerance will decrease Outcome: Progressing   Problem: Coping: Goal: Level of anxiety will decrease Outcome: Progressing   Problem: Safety: Goal: Ability to remain free from injury will improve Outcome: Progressing   Problem: Pain Managment: Goal: General experience of comfort will improve and/or be controlled Outcome: Progressing   Problem: Skin Integrity: Goal: Risk for impaired skin integrity will decrease Outcome: Progressing

## 2024-07-23 NOTE — Plan of Care (Signed)
  Problem: Respiratory: Goal: Ability to maintain a clear airway and adequate ventilation will improve Outcome: Progressing   Problem: Education: Goal: Knowledge of General Education information will improve Description: Including pain rating scale, medication(s)/side effects and non-pharmacologic comfort measures Outcome: Progressing   Problem: Health Behavior/Discharge Planning: Goal: Ability to manage health-related needs will improve Outcome: Progressing

## 2024-07-23 NOTE — Assessment & Plan Note (Addendum)
 Smallbore feeding tube left nare.  Continue to follow goals of care discussions as above with palliative. - Feeding supplement (Osmolite 1.5 Cal) liquid 50 mL/hr continuous until discontinued - Feeding supplement (Prosource TF 20) liquid 60 mL daily - Fiber supplement (Banatrol TF) liquid 60 mL BID - Mupirocin  ointment 2% twice daily - Protonix  40 mg daily

## 2024-07-23 NOTE — Plan of Care (Signed)
 FMTS Interim Progress Note  Patient is at the bedside as part of night rounds with wife present.  She reports he did take the oxycodone  2.5 mg earlier but still had some pain and after the second 2.5 mg dose feels like he is more comfortable.  States he was clutching his right upper quadrant earlier but now appears comfortable and resting.  Reports his agitation and disorientation has been much improved with the Seroquel  increased.  Reports he continues to have very low appetite she has been unable to get him to eat.  Patient's wife stated she got the impression from the palliative care meeting earlier today that he may not make it out of the hospital.  O: BP 123/73 (BP Location: Right Arm)   Pulse 77   Temp 97.7 F (36.5 C) (Axillary)   Resp 18   Ht 5' 9 (1.753 m)   Wt 73.7 kg   SpO2 97%   BMI 23.99 kg/m    General: Resting in bed, NAD CV: RRR without murmur RESP: Normal work of breathing on room air with some shallow inspiration.  Mildly rhonchorous/snoring breath sounds intermittently.  CTAB. Abdomen: Soft, nondistended.  Did not note any facial grimacing or discomfort with abdominal palpation.   A/P: Glioblastoma with secondary seizures Sundowning/delirium Mentation improved on increased Seroquel  dose, will continue to monitor overnight.  Wife expressed concern about poor prognosis patient possibly never leaving the hospital, would benefit from follow-up discussion with palliative/day team.  A-fib with RVR Acute right gastrocnemius vein DVT NSR, rate controlled.  Patient wife stated she would like to discuss anticoagulation plan with family further but leaning towards restarting aspirin  and doing follow-up monitoring of DVT.  Will continue discussion tomorrow.  RUQ abdominal pain Comfortable upon exam, nontender to palpation.  RUQ ultrasound showed gallbladder wall thickening without acalculous cholecystitis.  Will monitor overnight and if recurrence of pain consider repeat  imaging.  Remainder plan per day team.  Reviewed and updated as needed.   Theophilus Pagan, MD 07/23/2024, 7:14 PM PGY-3, [facility] Family Medicine Service pager [phone]

## 2024-07-24 ENCOUNTER — Ambulatory Visit (INDEPENDENT_AMBULATORY_CARE_PROVIDER_SITE_OTHER)

## 2024-07-24 DIAGNOSIS — A419 Sepsis, unspecified organism: Secondary | ICD-10-CM

## 2024-07-24 DIAGNOSIS — R55 Syncope and collapse: Secondary | ICD-10-CM | POA: Diagnosis not present

## 2024-07-24 DIAGNOSIS — G9389 Other specified disorders of brain: Secondary | ICD-10-CM | POA: Diagnosis not present

## 2024-07-24 DIAGNOSIS — R6520 Severe sepsis without septic shock: Secondary | ICD-10-CM

## 2024-07-24 DIAGNOSIS — R569 Unspecified convulsions: Secondary | ICD-10-CM | POA: Diagnosis not present

## 2024-07-24 DIAGNOSIS — D497 Neoplasm of unspecified behavior of endocrine glands and other parts of nervous system: Secondary | ICD-10-CM | POA: Diagnosis not present

## 2024-07-24 DIAGNOSIS — I8290 Acute embolism and thrombosis of unspecified vein: Secondary | ICD-10-CM | POA: Insufficient documentation

## 2024-07-24 DIAGNOSIS — I4891 Unspecified atrial fibrillation: Secondary | ICD-10-CM | POA: Diagnosis not present

## 2024-07-24 DIAGNOSIS — C719 Malignant neoplasm of brain, unspecified: Secondary | ICD-10-CM | POA: Diagnosis not present

## 2024-07-24 LAB — GLUCOSE, CAPILLARY
Glucose-Capillary: 242 mg/dL — ABNORMAL HIGH (ref 70–99)
Glucose-Capillary: 207 mg/dL — ABNORMAL HIGH (ref 70–99)
Glucose-Capillary: 104 mg/dL — ABNORMAL HIGH (ref 70–99)
Glucose-Capillary: 210 mg/dL — ABNORMAL HIGH (ref 70–99)
Glucose-Capillary: 235 mg/dL — ABNORMAL HIGH (ref 70–99)
Glucose-Capillary: 221 mg/dL — ABNORMAL HIGH (ref 70–99)

## 2024-07-24 LAB — COMPREHENSIVE METABOLIC PANEL WITH GFR
ALT: 88 U/L — ABNORMAL HIGH (ref 0–44)
AST: 40 U/L (ref 15–41)
Albumin: 2.4 g/dL — ABNORMAL LOW (ref 3.5–5.0)
Alkaline Phosphatase: 96 U/L (ref 38–126)
Anion gap: 11 (ref 5–15)
BUN: 29 mg/dL — ABNORMAL HIGH (ref 8–23)
CO2: 23 mmol/L (ref 22–32)
Calcium: 8.7 mg/dL — ABNORMAL LOW (ref 8.9–10.3)
Chloride: 101 mmol/L (ref 98–111)
Creatinine, Ser: 0.76 mg/dL (ref 0.61–1.24)
GFR, Estimated: >60 mL/min (ref >60)
Glucose, Bld: 187 mg/dL — ABNORMAL HIGH (ref 70–99)
Potassium: 4.9 mmol/L (ref 3.5–5.1)
Sodium: 135 mmol/L (ref 135–145)
Total Bilirubin: 0.9 mg/dL (ref 0.0–1.2)
Total Protein: 6.5 g/dL (ref 6.5–8.1)

## 2024-07-24 LAB — CBC
HCT: 47.5 % (ref 39.0–52.0)
Hemoglobin: 15.6 g/dL (ref 13.0–17.0)
MCH: 31.1 pg (ref 26.0–34.0)
MCHC: 32.8 g/dL (ref 30.0–36.0)
MCV: 94.8 fL (ref 80.0–100.0)
Platelets: 406 K/uL — ABNORMAL HIGH (ref 150–400)
RBC: 5.01 MIL/uL (ref 4.22–5.81)
RDW: 12.5 % (ref 11.5–15.5)
WBC: 15.6 K/uL — ABNORMAL HIGH (ref 4.0–10.5)
nRBC: 0 % (ref 0.0–0.2)

## 2024-07-24 MED ORDER — ATORVASTATIN CALCIUM 40 MG PO TABS
40.0000 mg | ORAL_TABLET | Freq: Every day | ORAL | Status: DC
  Administered 2024-07-25 – 2024-08-04 (×11): 40 mg via ORAL
  Filled 2024-07-24 (×11): qty 1

## 2024-07-24 MED ORDER — HEPARIN (PORCINE) 25000 UT/250ML-% IV SOLN
1250.0000 [IU]/h | INTRAVENOUS | Status: DC
  Administered 2024-07-24 – 2024-07-25 (×2): 1100 [IU]/h via INTRAVENOUS
  Administered 2024-07-26: 1250 [IU]/h via INTRAVENOUS
  Filled 2024-07-24 (×3): qty 250

## 2024-07-24 MED ORDER — PANCRELIPASE (LIP-PROT-AMYL) 10440-39150 UNITS PO TABS
20880.0000 [IU] | ORAL_TABLET | Freq: Once | ORAL | Status: AC
Start: 2024-07-24 — End: 2024-07-24
  Administered 2024-07-24: 20880 [IU]
  Filled 2024-07-24: qty 2

## 2024-07-24 MED ORDER — ENSURE PLUS HIGH PROTEIN PO LIQD
237.0000 mL | Freq: Three times a day (TID) | ORAL | Status: DC
  Administered 2024-07-24 – 2024-08-04 (×31): 237 mL via ORAL

## 2024-07-24 MED ORDER — SODIUM BICARBONATE 650 MG PO TABS
650.0000 mg | ORAL_TABLET | Freq: Once | ORAL | Status: AC
  Administered 2024-07-24: 650 mg
  Filled 2024-07-24: qty 1

## 2024-07-24 MED ORDER — PROSOURCE TF20 ENFIT COMPATIBL EN LIQD
60.0000 mL | Freq: Two times a day (BID) | ENTERAL | Status: DC
  Administered 2024-07-24 – 2024-07-26 (×4): 60 mL
  Filled 2024-07-24 (×4): qty 60

## 2024-07-24 MED ORDER — JEVITY 1.5 CAL/FIBER PO LIQD
1000.0000 mL | ORAL | Status: DC
  Administered 2024-07-24 – 2024-07-25 (×2): 1000 mL
  Filled 2024-07-24 (×3): qty 1000

## 2024-07-24 NOTE — Progress Notes (Signed)
 PT Cancellation Note  Patient Details Name: Wayne Molina MRN: [id number] DOB: 1956/03/21   Cancelled Treatment:    Reason Eval/Treat Not Completed: Medical issues which prohibited therapy spoke to MD- pt with new DVT and family trying to decide about anticoagulation.   Aleck Daring, PT, DPT Acute Rehabilitation Services Office [phone]    [name] 07/24/2024, 8:49 AM

## 2024-07-24 NOTE — Progress Notes (Signed)
 Daily Progress Note   Patient Name: Wayne Molina       Date: 07/24/2024 DOB: 02-16-1956  Age: 68 y.o. MRN#: [id number] Attending Physician: Delores Suzann HERO, MD Primary Care Physician: Janna Ferrier, DO Admit Date: 07/06/2024  Reason for Consultation/Follow-up: Establishing goals of care  Subjective: Medical records reviewed including progress notes, labs and imaging. Patient assessed at the bedside. He is lethargic but able to engage in conversation. His wife is present visiting.  Created space and opportunity for patient and family's thoughts and feelings on patient's current illness. They are considering decision of anticoagulation. We discussed the risks and benefits in detail in light of hypercoagulable state with a malignancy. I emphasized the importance of considering quality of life when making all decisions. Edilberto is very much wanting to go home, though [name] could not support him by herself in his current condition. We discussed how different her retirement and near-retirement are now going to look than they envisioned. Emotional support and therapeutic listening was provided. We discussed what to anticipate as next steps in patient's care coordination.   Outpatient palliative care was explained and offered. Patient and family are agreeable.  Questions and concerns addressed. PMT will continue to support holistically.   Length of Stay: 17   Physical Exam Vitals and nursing note reviewed.  Constitutional:      General: He is not in acute distress.    Appearance: He is ill-appearing.     Interventions: Nasal cannula in place.     Comments: Cortrak in place  Cardiovascular:     Rate and Rhythm: Normal rate.  Pulmonary:     Effort: Pulmonary effort is normal.  Neurological:     Mental Status: He is lethargic.  Psychiatric:         Speech: Speech is delayed.             Vital Signs: BP (!) 151/87 (BP Location: Right Arm)   Pulse 79   Temp 97.8 F (36.6 C) (Axillary)   Resp 16   Ht 5' 9 (1.753 m)   Wt 73.7 kg   SpO2 100%   BMI 23.99 kg/m  SpO2: SpO2: 100 % O2 Device: O2 Device: Room Air O2 Flow Rate: O2 Flow Rate (L/min): 5 L/min      Palliative Assessment/Data:   Palliative Care Assessment & Plan   Patient Profile: 68 y.o. male  with past medical history of atrial fibrillation, depression/anxiety, hypertension, hyperlipidemia, atypical variant hypertrophic cardiomyopathy, OSA, osteoporosis admitted on 07/06/2024 with weakness of left arm and slurred speech.    I nitially brought in as code stroke in  the emergency room, CTA negative for large vessel occlusion and suggestive of possible right MCA stroke. Repeat head CT was obtained in the ED due to AMS and eye twitching, suggestive of mass. He then received lorazepam  for seizure-like activity, subsequently intubated for airway protection and transferred to the intensive care unit. 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 Hospitalization further complicated by fevers, RLE DVT, HAP, afib/RVR, malnutrition requiring cortrak placement, sundowning.    PMT has been consulted to assist with goals of care conversation.  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 I have placed a referral today Psychosocial and emotional support provided PMT will see again 9/19   Prognosis:  Poor with highly aggressive BGM and several acute complications   Discharge Planning: To Be Determined  Care plan was discussed with patient, patient's wife   Sharvi Mooneyhan SHAUNNA Fell, PA-C  Palliative Medicine Team Team phone #  [phone]  Thank you for allowing the Palliative Medicine Team to assist in the care of this patient. Please utilize secure chat with additional questions, if there is no response within 30 minutes please call the above phone number.  Palliative Medicine Team providers are available by phone from 7am to 7pm daily and can be reached through the team cell phone.  Should this patient require assistance outside of these hours, please call the patient's attending physician.     Time Total: 35  Visit consisted of counseling and education dealing with the complex and emotionally intense issues of symptom management and palliative care in the setting of serious and potentially life-threatening illness. Greater than 50% of this time was spent counseling and coordinating care related to the above assessment and plan.  Personally spent 35 minutes in patient care including extensive chart review (labs, imaging, progress/consult notes, vital signs), medically appropraite exam, discussed with treatment team, education to patient, family, and staff, documenting clinical information, medication review and management, coordination of care, and available advanced directive documents.

## 2024-07-24 NOTE — Plan of Care (Signed)
 Discussed anticoagulation with family and patient at bedside. We will proceed with heparin  infusion WITHOUT bolus. NSGY and neurology are in agreement with restarting anticoagulation in light of his afib and new DVT. Will continue to monitor clinical status.

## 2024-07-24 NOTE — Progress Notes (Addendum)
 Daily Progress Note Intern Pager: [phone]  Patient name: Wayne Molina Medical record number: [id number] Date of birth: 17-Jun-1956 Age: 68 y.o. Gender: male  Primary Care Provider: Janna Ferrier, DO Consultants: Neurosurgery, neurology, oncology  Code Status: DNR-Limited  Pt Overview and Major Events to Date:  8/29-admitted, focal seizure in ED, intubated to the ICU 9/4-extubated 9/8-neurosurgery completed open craniotomy for tumor biopsy and confirmed glioblastoma  Medical Decision Making:  Wayne Molina is a 68 y.o. male with a PMH of HTN, HLD, A-fib not currently on anticoagulation, OSA, and osteoporosis admitted after a seizure and found to have glioblastoma multiforme.  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 Patient able to sleep better overnight with his Seroquel . Continued lack of appetite.  - Neurology consulted, recs appreciated  - Restarted ASA 81 mg after discussing with NSGY  - May restart Eliquis  on 9/16 - If any acute change in mental status since pt isn't anticoagulated, recommend CT head w/o contrast and CTA head and neck to r/o bleed/stroke - Continue dexamethasone  to 8am, 1pm, and 6 pm along with other nightly medications at 6 PM - Neurosurgery consulted, appreciate recommendations  - Palliative care consulted, appreciate assistance in patient's care - Keppra  1,000 mg BID - Dexamethasone  4 mg IV Q8H - Pain regimen: acetaminophen  650 mg (will increase to 1000 mg every 6 hours), consider addition of Toradol  every 6 hours, try to avoid sedating pain medications if possible - Increased Lantus  to 8 units QHS and SSI w/ steroids to maintain glucose 140-180 - Nausea regimen: Zofran  4 mg PRN - Continuous cardiac monitoring - Neuro checks Q4H - PT/OT/SLP consulted, appreciate recommendations - Fall and seizure precautions - AM Labs: BMP with GFR, CBC Fever HAP (hospital-acquired pneumonia) Afebrile and  breathing comfortably on room air. He does have a chronic indwelling urinary catheter, and while unlikely cause of the fever, will attempt void trial later today. - Covid and flu both negative - Vancomycin  D/C'd as MRSA negative, will complete 5 days of Zosyn  (end 9/16)  - Flomax  0.4 mg currently also has a urinary catheter, will attempt void trial today Sundowning Improved status post Seroquel  dosing as below. - Melatonin 3 mg at 6pm - Per neurology, will increase Seroquel  to 25 mg at 6 PM, with an additional Seroquel  12.5 at 8 PM if patient is not resting - Discontinued sitter and restraints  Atrial fibrillation with RVR (HCC) Rate controlled.  Currently holding patient's Eliquis  s/p R craniotomy with tumor biopsy on 9/8. Family discussing risks vs. Benefits of Eliquis , currently leaning towards continuing aspirin  only.  Patient's wife is going to discuss with his brother today. - ASA 81 mg per neuro for bridge while off anticoagulation for 7 days s/p biopsy - Continue Coreg  to 12.5 twice daily Hypertension Hypertensive this morning. Holding home Losartan . Discontinued hydralazine  9/12. Systolic <160.  - Coreg  12.5  mg BID  Malnutrition of moderate degree Smallbore feeding tube left nare.  Continue to follow goals of care discussions as above with palliative. Patient with poor p.o. intake.  - Feeding supplement (Osmolite 1.5 Cal) liquid 50 mL/hr continuous until discontinued - Feeding supplement (Prosource TF 20) liquid 60 mL daily - Fiber supplement (Banatrol TF) liquid 60 mL BID - Mupirocin  ointment 2% twice daily - Protonix  40 mg daily Wound of gluteal cleft As seen on photos from 9/14 AM.  Likely from prolonged immobility and moisture.  Satellite lesions suggestive of fungal etiology. -Continue clotrimazole   cream twice daily - Gerhardt's butt cream TID  - Desitin 40% ointment  -Continue to turn patient every 2 hours - WOC consult  Thrombosis/embolism, venous DVT US  on 9/14  showed: Findings consistent with acute intramuscular thrombosis involving the right gastrocnemius veins. Anticoagulation use pending family decision as above.  - Currently on aspirin  as above  Chronic health problem HLD - continue home Atorvastatin  40mg  daily  FEN/GI: Dysphagia 3 PPx: SCDs to L leg,  aspirin  (may restart Eliquis , family discussing risk vs. Benefits) Dispo: Pending clinical improvement.  Subjective:  Patient was seen lying in bed this morning. He states he is doing all right. Air mattress overlay in place. Patient is being turned, but wife says the pillows do not keep him on his side. Patient's brother is going to get him a wedge pillow. Patient was able to get better sleep last night. His wife states he still tried to get up last night and stated he was ready to go home. He is having pain on his bottom. He denies any pain in the abdomen, head this morning. He states he is not hungry or thirsty, says he feels full. His wife states he has a little bit of eggs and grits 2 days ago, but has not eaten or drank much of anything since.   Objective: Temp:  [97.4 F (36.3 C)-98.7 F (37.1 C)] 97.7 F (36.5 C) (09/15 0400) Pulse Rate:  [72-82] 81 (09/15 0400) Resp:  [17-19] 19 (09/15 0400) BP: (98-147)/(65-88) 147/85 (09/15 0400) SpO2:  [96 %-99 %] 97 % (09/15 0400) Physical Exam: General: ill-appearing male, NAD Cardiovascular: irregularly irregular, normal rate Respiratory: CTAB anteriorly, normal work of breathing on room air  Abdomen: soft, non-distended, non-tender to palpation  Extremities: cool, dry, no edema   Laboratory: Most recent CBC Lab Results  Component Value Date   WBC 15.6 (H) 07/24/2024   HGB 15.6 07/24/2024   HCT 47.5 07/24/2024   MCV 94.8 07/24/2024   PLT 406 (H) 07/24/2024   Most recent BMP    Latest Ref Rng & Units 07/23/2024    6:33 AM  BMP  Glucose 70 - 99 mg/dL 841   BUN 8 - 23 mg/dL 27   Creatinine 9.38 - 1.24 mg/dL 9.20   Sodium 864 - 854  mmol/L 135   Potassium 3.5 - 5.1 mmol/L 4.3   Chloride 98 - 111 mmol/L 99   CO2 22 - 32 mmol/L 25   Calcium  8.9 - 10.3 mg/dL 8.7    Imaging/Diagnostic Tests: RUQ US  (9/14) 1. Nodular liver contour raises suspicion for cirrhosis. 2. Borderline gallbladder wall thickening may be due to nondistention. No sonographic Murphy sign noted by sonographer.  Bilateral DVT US  (9/14) - No evidence of deep vein thrombosis seen in the lower extremities,  bilaterally.  -No evidence of popliteal cyst, bilaterally.  RIGHT:  Findings consistent with acute intramuscular thrombosis involving the  right gastrocnemius veins.   CT Head (9/14) 1. Stable right parietal operative bed and mass with more hyperdense component extending into the right posterior aspect of the corpus callosum, creating mass effect on the right lateral ventricle. 2. Stable vasogenic edema in the high right parietal lobe.  [name], DO 07/24/2024, 7:57 AM  PGY-1, [place] [facility] FPTS Intern pager: [phone], text pages welcome Secure chat group [facility] Teaching Service

## 2024-07-24 NOTE — Assessment & Plan Note (Addendum)
 Hypertensive this morning. Holding home Losartan . Discontinued hydralazine  9/12. Systolic <160.  - Coreg  12.5  mg BID

## 2024-07-24 NOTE — Plan of Care (Signed)

## 2024-07-24 NOTE — Assessment & Plan Note (Addendum)
 Afebrile and breathing comfortably on room air. He does have a chronic indwelling urinary catheter, and while unlikely cause of the fever, will attempt void trial later today. - Covid and flu both negative - Vancomycin  D/C'd as MRSA negative, will complete 5 days of Zosyn  (end 9/16)  - Flomax  0.4 mg currently also has a urinary catheter, will attempt void trial today

## 2024-07-24 NOTE — Progress Notes (Signed)
 Nutrition Follow-up  DOCUMENTATION CODES:  Non-severe (moderate) malnutrition in context of chronic illness  INTERVENTION:  Continue current diet as ordered Encourage PO intake Family to assist with ordering meals Ensure Plus High Protein po QID, each supplement provides 350 kcal and 20 grams of protein, pt prefers chocolate Continue tube feeding via Cortrak: Jevity 1.5 @ 45 mL/hr (1080 mL/day) PROSource TF20 60 mL BID Tube feeding regimen provides 1780 kcal, 109 grams of protein, and 821 ml of H2O.  NUTRITION DIAGNOSIS:  Moderate Malnutrition related to chronic illness as evidenced by moderate fat depletion, severe muscle depletion. - remains applicable  GOAL:  Patient will meet greater than or equal to 90% of their needs - progressing  MONITOR:  PO intake, TF tolerance, I & O's, Skin  REASON FOR ASSESSMENT:  Consult, Ventilator Enteral/tube feeding initiation and management  ASSESSMENT:  Pt admitted with AMS, found to have new brain mass. PMH significant for afib on eliquis , HTN, HLD.  8/28: admitted 8/29: intubated d/t acute hypoxic respiratory failure 8/31: tube feeding initiated 9/2 - Cortrak placed, tip gastric  9/4 - extubated 9/8 - Op, right craniotomy to obtain brain tumor biopsy 9/9 - SLP BSE, DYS2, thin  Pt resting in bed at the time of assessment awake and alert but voice is very quiet. Family at bedside assists with hx. Family states that pt had a few bites of lunch but intake remains minimal. Reports that pt is having a lot of stool which is also contributing to poor PO. Discussed change in formula to Jevity and also decreased the rate. Will monitor to see if these changes assist with managing stool.  Pt drank ensure at home, added QID and encouraged to drink some or each offered. Prefers chocolate.   Will continue to monitor bowels and intake and adjust feeding plan in response.   Admit weight: 73.9 kg  Current weight: 74 kg    Nutritionally Relevant  Medications: Scheduled Meds:  atorvastatin   40 mg Per Tube Daily   dexamethasone  injection  4 mg Intravenous Q8H   PROSource TF20  60 mL Per Tube Daily   BANATROL TF  60 mL Per Tube BID   free water   200 mL Per Tube Q6H   insulin  aspart  0-15 Units Subcutaneous Q4H   insulin  glargine  5 Units Subcutaneous QHS   pantoprazole  (PROTONIX ) IV  40 mg Intravenous QHS   Continuous Infusions:  feeding supplement (OSMOLITE 1.5 CAL) 1,000 mL (07/19/24 0850)   PRN Meds: ondansetron , promethazine   Labs Reviewed: BUN 29 CBG ranges from 142-244 mg/dL over the last 24 hours HgbA1c 6.0% (8/29)  NUTRITION - FOCUSED PHYSICAL EXAM: Flowsheet Row Most Recent Value  Orbital Region Moderate depletion  Upper Arm Region Mild depletion  Thoracic and Lumbar Region Mild depletion  Buccal Region Moderate depletion  Temple Region Severe depletion  Clavicle Bone Region Moderate depletion  Clavicle and Acromion Bone Region Moderate depletion  Scapular Bone Region Unable to assess  Dorsal Hand Moderate depletion  Patellar Region Severe depletion  Anterior Thigh Region Severe depletion  Posterior Calf Region Moderate depletion  Edema (RD Assessment) None  Hair Reviewed  Eyes Reviewed  Mouth Reviewed  Skin Reviewed  Nails Reviewed    Diet Order:   Diet Order             DIET DYS 3 Room service appropriate? Yes; Fluid consistency: Thin  Diet effective now  EDUCATION NEEDS:  Education needs have been addressed  Skin:  Skin Assessment: Skin Integrity Issues: (irritant dermatitis, buttocks)  Last BM:  9/15 - type 6 and 7, 4 occurances yesterday of type 7  Height:  Ht Readings from Last 1 Encounters:  07/07/24 5' 9 (1.753 m)    Weight:  Wt Readings from Last 1 Encounters:  07/23/24 73.7 kg    Ideal Body Weight:  72.7 kg  BMI:  Body mass index is 23.99 kg/m.  Estimated Nutritional Needs:  Kcal:  1800-2000 Protein:  95-110g Fluid:  >/=1.8L    Vernell Lukes, RD, LDN, CNSC Registered Dietitian II Please reach out via secure chat

## 2024-07-24 NOTE — Assessment & Plan Note (Addendum)
 DVT US  on 9/14 showed: Findings consistent with acute intramuscular thrombosis involving the right gastrocnemius veins. Anticoagulation use pending family decision as above.  - Currently on aspirin  as above

## 2024-07-24 NOTE — Progress Notes (Signed)
 Subjective: No acute events overnight.  Had a more restful evening.  Sitting up this morning.  Wife at bedside.  Patient's wife states he has not been eating anything.  But patient states he is not hungry.  However when asked what he can eat he said he might eat [employer] apple pie.    ROS: negative except above  Examination  Vital signs in last 24 hours: Temp:  [97.7 F (36.5 C)-98.7 F (37.1 C)] 97.8 F (36.6 C) (09/15 0800) Pulse Rate:  [72-81] 79 (09/15 0800) Resp:  [16-19] 16 (09/15 0800) BP: (98-151)/(65-87) 151/87 (09/15 0800) SpO2:  [97 %-100 %] 100 % (09/15 0800)  General: Sitting in bed, NAD Neuro: awake, able to tell me his name and that he is at a hospital, was following simple commands, CN grossly intact, antigravity strength in all 4 extremities  Basic Metabolic Panel: Recent Labs  Lab 07/20/24 0202 07/21/24 0240 07/22/24 0211 07/23/24 0633 07/24/24 0843  NA 135 132* 131* 135 135  K 4.7 4.6 4.5 4.3 4.9  CL 102 102 101 99 101  CO2 22 21* 22 25 23   GLUCOSE 108* 184* 246* 158* 187*  BUN 29* 28* 27* 27* 29*  CREATININE 0.79 0.71 0.80 0.79 0.76  CALCIUM  9.2 8.5* 8.6* 8.7* 8.7*    CBC: Recent Labs  Lab 07/18/24 0529 07/21/24 1043 07/22/24 0211 07/23/24 0633 07/24/24 0616  WBC 18.7* 49.4* 43.7* 24.5* 15.6*  NEUTROABS  --  47.9*  --   --   --   HGB 15.0 13.5 13.1 14.2 15.6  HCT 44.2 39.8 38.6* 42.0 47.5  MCV 90.8 91.1 91.3 90.7 94.8  PLT 371 372 401* 525* 406*     Coagulation Studies: No results for input(s): LABPROT, INR in the last 72 hours.  Imaging CTH wo contrast 07/23/2024: Stable right parietal operative bed and mass with more hyperdense component extending into the right posterior aspect of the corpus callosum, creating mass effect on the right lateral ventricle. Stable vasogenic edema in the high right parietal lobe.   ASSESSMENT AND PLAN:68 y.o. male with hx of Htn, HLD, pAfibb on eliquis , OSA who presents to the ED with acute onset L  arm weakness and L hemianopsia.    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 Please dc asa once eliquis  is resumed -Of note, patient will need stat CT head for any acute worsening of mental status or new neurological deficits once we resume eliquis  - Continue Seroquel  25 mg at 6 PM to minimize delirium and sundowning with additional 12.5mg  at 2000 if needed - Continue dexamethasone  4mg  Q8h for now -Oncology team planning to see patient today -As needed Versed  for clinical seizures -Discussed plan with wife at bedside and medicine team via secure chat      I personally spent a total of 36 minutes in the care of the patient today including getting/reviewing separately obtained history, performing a medically appropriate exam/evaluation, counseling and educating, placing orders, referring and communicating with other health care professionals, documenting clinical information in the EHR, independently interpreting results, and coordinating care.        Arlin [facility] For questions after 5pm please refer to AMION to reach the Neurologist on call

## 2024-07-24 NOTE — Assessment & Plan Note (Addendum)
 As seen on photos from 9/14 AM.  Likely from prolonged immobility and moisture.  Satellite lesions suggestive of fungal etiology. -Continue clotrimazole  cream twice daily - Gerhardt's butt cream TID  - Desitin 40% ointment  -Continue to turn patient every 2 hours - WOC consult

## 2024-07-24 NOTE — Assessment & Plan Note (Addendum)
 HLD - continue home Atorvastatin  40mg  daily

## 2024-07-24 NOTE — Progress Notes (Signed)
 OT Cancellation Note  Patient Details Name: DHRUVAN GULLION MRN: [id number] DOB: 1956/05/18   Cancelled Treatment:    Reason Eval/Treat Not Completed: Medical issues which prohibited therapy, spoke to MD- pt with new DVT and family trying to decide about anticoagulation. OT to hold at this time.  Will check back tomorrow.   Etta NOVAK, OT Acute Rehabilitation Services Office [phone] Secure Chat Preferred    Etta GORMAN Hope 07/24/2024, 8:38 AM

## 2024-07-24 NOTE — Assessment & Plan Note (Addendum)
 Smallbore feeding tube left nare.  Continue to follow goals of care discussions as above with palliative. Patient with poor p.o. intake.  - Feeding supplement (Osmolite 1.5 Cal) liquid 50 mL/hr continuous until discontinued - Feeding supplement (Prosource TF 20) liquid 60 mL daily - Fiber supplement (Banatrol TF) liquid 60 mL BID - Mupirocin  ointment 2% twice daily - Protonix  40 mg daily

## 2024-07-24 NOTE — Assessment & Plan Note (Addendum)
 Patient able to sleep better overnight with his Seroquel . Continued lack of appetite.  - Neurology consulted, recs appreciated  - Restarted ASA 81 mg after discussing with NSGY  - May restart Eliquis  on 9/16 - If any acute change in mental status since pt isn't anticoagulated, recommend CT head w/o contrast and CTA head and neck to r/o bleed/stroke - Continue dexamethasone  to 8am, 1pm, and 6 pm along with other nightly medications at 6 PM - Neurosurgery consulted, appreciate recommendations  - Palliative care consulted, appreciate assistance in patient's care - Keppra  1,000 mg BID - Dexamethasone  4 mg IV Q8H - Pain regimen: acetaminophen  650 mg (will increase to 1000 mg every 6 hours), consider addition of Toradol  every 6 hours, try to avoid sedating pain medications if possible - Increased Lantus  to 8 units QHS and SSI w/ steroids to maintain glucose 140-180 - Nausea regimen: Zofran  4 mg PRN - Continuous cardiac monitoring - Neuro checks Q4H - PT/OT/SLP consulted, appreciate recommendations - Fall and seizure precautions - AM Labs: BMP with GFR, CBC

## 2024-07-24 NOTE — Plan of Care (Signed)
 FMTS Interim Progress Note  Patient assessed at bedside as part of night rounds with wife present.  Patient reports he does have some irritation on his buttocks, CNA in the room and plan to change him shortly.  Patient also reports he feels like he has to stool.  Wife states he did stool today but fortunately diarrhea has improved.  Patient answering questions appropriately.  No questions or concerns  O: BP (!) 155/84 (BP Location: Right Leg)   Pulse 72   Temp 97.6 F (36.4 C) (Oral)   Resp 18   Ht 5' 9 (1.753 m)   Wt 73.7 kg   SpO2 97%   BMI 23.99 kg/m    General: Laying in bed on his left side, NG in place.  NAD RESP: Normal work of breathing on room air GU: Bedding saturated with urine.  A/P: Distal DVT of right leg Atrial fibrillation Family opted for full dose anticoagulation, currently on heparin  without bolus dosing.  No acute neurologic changes, will continue to monitor overnight.  Void trial No documented bladder scan or urine output but patient with void upon exam.  Communicated with RN will obtain bladder scan and monitor UOP overnight given Foley removed sometime mid afternoon today. -Continue bladder scans every 8 hours if not voiding  Sundowning Doing well on current Seroquel  dose.  Remainder plan per day team.  Orders reviewed and updated as needed.  Wayne Pagan, MD 07/24/2024, 9:51 PM PGY-3, [facility] Family Medicine Service pager [phone]

## 2024-07-24 NOTE — Assessment & Plan Note (Addendum)
 Improved status post Seroquel  dosing as below. - Melatonin 3 mg at 6pm - Per neurology, will increase Seroquel  to 25 mg at 6 PM, with an additional Seroquel  12.5 at 8 PM if patient is not resting - Discontinued sitter and restraints

## 2024-07-24 NOTE — Progress Notes (Signed)
 PHARMACY - ANTICOAGULATION CONSULT NOTE  Pharmacy Consult for Heparin  Indication: DVT  No Known Allergies  Patient Measurements: Height: 5' 9 (175.3 cm) Weight: 73.7 kg (162 lb 7.7 oz) IBW/kg (Calculated) : 70.7 HEPARIN  DW (KG): 73.9  Vital Signs: Temp: 97.8 F (36.6 C) (09/15 1321) Temp Source: Oral (09/15 1321) BP: 137/96 (09/15 1321) Pulse Rate: 74 (09/15 1321)  Labs: Recent Labs    07/22/24 0211 07/23/24 9366 07/24/24 0616 07/24/24 0843  HGB 13.1 14.2 15.6  --   HCT 38.6* 42.0 47.5  --   PLT 401* 525* 406*  --   CREATININE 0.80 0.79  --  0.76    Estimated Creatinine Clearance: 85.9 mL/min (by C-G formula based on SCr of 0.76 mg/dL).   Medical History: Past Medical History:  Diagnosis Date   Anxiety    Arthritis    fingers   Chronic atrial fibrillation (HCC)    Depression    Family history of breast cancer    Family history of cancer of male genital organ    Family history of gene mutation    BRIP1   Family history of malignant neoplasm of gastrointestinal tract    Hyperlipidemia    Hypertension    Personal history of colonic polyps 11/12/2006   Sleep apnea     Medications:  Infusions:   feeding supplement (JEVITY 1.5 CAL/FIBER) 1,000 mL (07/24/24 1300)   piperacillin -tazobactam (ZOSYN )  IV 3.375 g (07/24/24 0534)    Assessment: History of a fib, post brain biopsy, new DVT. Restarting heparin  cautiously per team, no bolus. Goal of Therapy:  Heparin  level 0.3-0.7 units/ml Monitor platelets by anticoagulation protocol: Yes   Plan:  Start heparin  infusion at 1100 units/hr Check anti-Xa level with AM labs and daily while on heparin  Continue to monitor H&H and platelets  Larraine Brazier, PharmD Clinical Pharmacist 07/24/2024  5:35 PM **Pharmacist phone directory can now be found on [email or web address] ([email or web address]).  Listed under [facility].

## 2024-07-24 NOTE — Assessment & Plan Note (Addendum)
 Rate controlled.  Currently holding patient's Eliquis  s/p R craniotomy with tumor biopsy on 9/8. Family discussing risks vs. Benefits of Eliquis , currently leaning towards continuing aspirin  only.  Patient's wife is going to discuss with his brother today. - ASA 81 mg per neuro for bridge while off anticoagulation for 7 days s/p biopsy - Continue Coreg  to 12.5 twice daily

## 2024-07-25 ENCOUNTER — Encounter: Admitting: Neurosurgery

## 2024-07-25 DIAGNOSIS — R569 Unspecified convulsions: Secondary | ICD-10-CM | POA: Diagnosis not present

## 2024-07-25 DIAGNOSIS — I8290 Acute embolism and thrombosis of unspecified vein: Secondary | ICD-10-CM | POA: Diagnosis not present

## 2024-07-25 DIAGNOSIS — D497 Neoplasm of unspecified behavior of endocrine glands and other parts of nervous system: Secondary | ICD-10-CM | POA: Diagnosis not present

## 2024-07-25 DIAGNOSIS — C719 Malignant neoplasm of brain, unspecified: Secondary | ICD-10-CM | POA: Diagnosis not present

## 2024-07-25 LAB — BASIC METABOLIC PANEL WITH GFR
Anion gap: 11 (ref 5–15)
BUN: 34 mg/dL — ABNORMAL HIGH (ref 8–23)
CO2: 20 mmol/L — ABNORMAL LOW (ref 22–32)
Calcium: 8.7 mg/dL — ABNORMAL LOW (ref 8.9–10.3)
Chloride: 103 mmol/L (ref 98–111)
Creatinine, Ser: 0.85 mg/dL (ref 0.61–1.24)
GFR, Estimated: >60 mL/min (ref >60)
Glucose, Bld: 173 mg/dL — ABNORMAL HIGH (ref 70–99)
Potassium: 5 mmol/L (ref 3.5–5.1)
Sodium: 134 mmol/L — ABNORMAL LOW (ref 135–145)

## 2024-07-25 LAB — CBC
HCT: 49.5 % (ref 39.0–52.0)
Hemoglobin: 16.5 g/dL (ref 13.0–17.0)
MCH: 30.6 pg (ref 26.0–34.0)
MCHC: 33.3 g/dL (ref 30.0–36.0)
MCV: 91.8 fL (ref 80.0–100.0)
Platelets: 529 K/uL — ABNORMAL HIGH (ref 150–400)
RBC: 5.39 MIL/uL (ref 4.22–5.81)
RDW: 12.6 % (ref 11.5–15.5)
WBC: 17 K/uL — ABNORMAL HIGH (ref 4.0–10.5)
nRBC: 0 % (ref 0.0–0.2)

## 2024-07-25 LAB — GLUCOSE, CAPILLARY
Glucose-Capillary: 167 mg/dL — ABNORMAL HIGH (ref 70–99)
Glucose-Capillary: 168 mg/dL — ABNORMAL HIGH (ref 70–99)
Glucose-Capillary: 206 mg/dL — ABNORMAL HIGH (ref 70–99)
Glucose-Capillary: 214 mg/dL — ABNORMAL HIGH (ref 70–99)
Glucose-Capillary: 216 mg/dL — ABNORMAL HIGH (ref 70–99)
Glucose-Capillary: 236 mg/dL — ABNORMAL HIGH (ref 70–99)

## 2024-07-25 LAB — CUP PACEART REMOTE DEVICE CHECK
Date Time Interrogation Session: 20250913231826
Implantable Pulse Generator Implant Date: 20240111

## 2024-07-25 LAB — HEPARIN LEVEL (UNFRACTIONATED)
Heparin Unfractionated: 0.31 [IU]/mL (ref 0.30–0.70)
Heparin Unfractionated: 0.38 [IU]/mL (ref 0.30–0.70)

## 2024-07-25 MED ORDER — PIPERACILLIN-TAZOBACTAM 3.375 G IVPB
3.3750 g | Freq: Three times a day (TID) | INTRAVENOUS | Status: AC
  Administered 2024-07-25 (×2): 3.375 g via INTRAVENOUS
  Filled 2024-07-25 (×2): qty 50

## 2024-07-25 MED ORDER — PHENOL 1.4 % MT LIQD
1.0000 | OROMUCOSAL | Status: DC | PRN
  Administered 2024-07-25: 1 via OROMUCOSAL
  Filled 2024-07-25: qty 177

## 2024-07-25 MED ORDER — QUETIAPINE FUMARATE 25 MG PO TABS
12.5000 mg | ORAL_TABLET | Freq: Once | ORAL | Status: AC
  Administered 2024-07-25: 12.5 mg
  Filled 2024-07-25: qty 1

## 2024-07-25 NOTE — Progress Notes (Addendum)
 PHARMACY - ANTICOAGULATION CONSULT NOTE  Pharmacy Consult for Heparin  Indication: hx afib, new DVT  No Known Allergies  Patient Measurements: Height: 5' 9 (175.3 cm) Weight: 73.7 kg (162 lb 7.7 oz) IBW/kg (Calculated) : 70.7 HEPARIN  DW (KG): 73.9  Vital Signs: Temp: 98 F (36.7 C) (09/16 0807) Temp Source: Oral (09/16 0807) BP: 154/88 (09/16 0807) Pulse Rate: 81 (09/16 0807)  Labs: Recent Labs    07/23/24 9366 07/24/24 0616 07/24/24 0843 07/25/24 0513  HGB 14.2 15.6  --  16.5  HCT 42.0 47.5  --  49.5  PLT 525* 406*  --  529*  HEPARINUNFRC  --   --   --  0.31  CREATININE 0.79  --  0.76 0.85    Estimated Creatinine Clearance: 80.9 mL/min (by C-G formula based on SCr of 0.85 mg/dL).   Medical History: Past Medical History:  Diagnosis Date   Anxiety    Arthritis    fingers   Chronic atrial fibrillation (HCC)    Depression    Family history of breast cancer    Family history of cancer of male genital organ    Family history of gene mutation    BRIP1   Family history of malignant neoplasm of gastrointestinal tract    Hyperlipidemia    Hypertension    Personal history of colonic polyps 11/12/2006   Sleep apnea     Medications:  Infusions:   feeding supplement (JEVITY 1.5 CAL/FIBER) 45 mL/hr at 07/24/24 2238   heparin  1,100 Units/hr (07/25/24 0451)   piperacillin -tazobactam (ZOSYN )  IV 3.375 g (07/25/24 0443)    Assessment: History of a fib on apixaban  PTA.  Anticoagulation held for brain biopsy.  Noted new DVT while off anticoagulation. Restarted heparin  cautiously 9/15 per team, no bolus.  Heparin  level at low end of goal on 1100 units/hr.  Goal of Therapy:  Heparin  level 0.3-0.7 units/ml Monitor platelets by anticoagulation protocol: Yes   Plan:  Continue heparin  infusion at 1100 units/hr Repeat heparin  level in 6 hours for confirmation Daily heparin  level and CBC Monitor closely for bleeding, neuro changes  [employer], Pharm.D.,  BCPS Clinical Pharmacist Clinical phone for 07/25/2024 from 7:30-3:00 is [phone].  **Pharmacist phone directory can be found on [email or web address] listed under [facility].  07/25/2024 9:04 AM   Addendum: Heparin  remains therapeutic at 1100 units/hr.  No change needed.  Follow-up with labs in AM.  Suzen Lovelace, Pharm.D., BCPS Clinical Pharmacist  **Pharmacist phone directory can be found on [email or web address] listed under [facility].  07/25/2024 2:35 PM

## 2024-07-25 NOTE — Assessment & Plan Note (Signed)
 HLD - continue home Atorvastatin  40mg  daily

## 2024-07-25 NOTE — Progress Notes (Signed)
 Subjective: No acute events overnight.  Per wife at bedside, he did not eat dinner yesterday.  However did have some bites of breakfast states he loves fried chicken from [employer]  ROS: negative except above  Examination  Vital signs in last 24 hours: Temp:  [97.4 F (36.3 C)-98 F (36.7 C)] 98 F (36.7 C) (09/16 0807) Pulse Rate:  [67-81] 81 (09/16 0807) Resp:  [18-19] 19 (09/16 0807) BP: (109-155)/(74-96) 154/88 (09/16 0807) SpO2:  [97 %] 97 % (09/16 0807)  General: lying in bed, NAD Neuro: Awake, alert, able to tell me his name, states that he is in hospital, follows commands, antigravity strength in all 4 extremities  Basic Metabolic Panel: Recent Labs  Lab 07/21/24 0240 07/22/24 0211 07/23/24 0633 07/24/24 0843 07/25/24 0513  NA 132* 131* 135 135 134*  K 4.6 4.5 4.3 4.9 5.0  CL 102 101 99 101 103  CO2 21* 22 25 23  20*  GLUCOSE 184* 246* 158* 187* 173*  BUN 28* 27* 27* 29* 34*  CREATININE 0.71 0.80 0.79 0.76 0.85  CALCIUM  8.5* 8.6* 8.7* 8.7* 8.7*    CBC: Recent Labs  Lab 07/21/24 1043 07/22/24 0211 07/23/24 0633 07/24/24 0616 07/25/24 0513  WBC 49.4* 43.7* 24.5* 15.6* 17.0*  NEUTROABS 47.9*  --   --   --   --   HGB 13.5 13.1 14.2 15.6 16.5  HCT 39.8 38.6* 42.0 47.5 49.5  MCV 91.1 91.3 90.7 94.8 91.8  PLT 372 401* 525* 406* 529*     Coagulation Studies: No results for input(s): LABPROT, INR in the last 72 hours.  Imaging No new brain imaging overnight    ASSESSMENT AND PLAN:68 y.o. 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  Will eventually transition to Eliquis  -Of note, patient will need stat CT head for any acute worsening of mental status or new neurological deficits once we resume eliquis  - Continue Seroquel  25 mg at 6 PM to minimize delirium  and sundowning with additional 12.5mg  at 2000 if needed - Continue dexamethasone  4mg  Q8h for now -As needed Versed  for clinical seizures -Discussed plan with wife at bedside and medicine team via secure chat       I personally spent a total of 35 minutes in the care of the patient today including getting/reviewing separately obtained history, performing a medically appropriate exam/evaluation, counseling and educating, placing orders, referring and communicating with other health care professionals, documenting clinical information in the EHR, independently interpreting results, and coordinating care.        Noris Kulinski [facility] For questions after 5pm please refer to AMION to reach the Neurologist on call

## 2024-07-25 NOTE — Progress Notes (Signed)
 Occupational Therapy Treatment Patient Details Name: Wayne Molina MRN: [id number] DOB: 1956-02-19 Today's Date: 07/25/2024   History of present illness Pt is a 68 y/o male presenting on 8/28 with L arm weakness, L hemianopsia, and slurred speech. 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 Course complicated by seizure episode. Intubated 8/29, extubated 9/4 but still with difficulty managing secretions.  Biopsy on 9/8. +DVT R LE 9/14.  PMH includes: anxiety, HLD, HTN, sleep apnea, chronic afib.   OT comments  Pt supine in bed, fatigued but eager to get OOB.  Cleared for OOB by MD (Dr. Delores). Pt requires mod assist for bed mobility +2, tactile cueing to attend to L side when getting OOB to L side. Setup to wash face with increased time using L hand, and contact guard sitting EOB.  Impulsively trying to stand up, but easily redirected. He requires +2 to stand at EOB, improved upright posture with RW  (compared to hand held assist) and able to step to recliner. Continues to require mod-max assist for Adls.  Encouraged spouse to have him engage in grooming tasks daily, and educated RN on using stedy for [facility] transfers. Will follow acutely. Continue to recommend >3hrs/day inpatient setting at dc.        If plan is discharge home, recommend the following:  Two people to help with walking and/or transfers;A lot of help with bathing/dressing/bathroom;Assistance with cooking/housework;Direct supervision/assist for medications management;Direct supervision/assist for financial management;Assist for transportation;Help with stairs or ramp for entrance   Equipment Recommendations  Other (comment) (defer)    Recommendations for Other Services Rehab consult    Precautions / Restrictions Precautions Precautions: Fall;Other (comment) Recall of Precautions/Restrictions: Impaired Precaution/Restrictions Comments: cortrak,  watch vitals, SBP <160 Restrictions Weight Bearing Restrictions Per Provider Order: No       Mobility Bed Mobility Overal bed mobility: Needs Assistance Bed Mobility: Rolling, Sidelying to Sit Rolling: Min assist Sidelying to sit: Mod assist, +2 for physical assistance, +2 for safety/equipment       General bed mobility comments: cues for sequencing and assist for LE advancement and trunk elevation    Transfers Overall transfer level: Needs assistance Equipment used: 2 person hand held assist, Rolling walker (2 wheels) Transfers: Sit to/from Stand, Bed to chair/wheelchair/BSC Sit to Stand: +2 physical assistance, +2 safety/equipment, Max assist, Mod assist     Step pivot transfers: Mod assist, +2 physical assistance, +2 safety/equipment     General transfer comment: From EOB initially with 2 HHA then RW with max A +2 to achieve upright posture due to flexed hips and knees and posterior bias. Progressed to mod A +2 from recliner with cues for hand placement and assist for anterior weight shift. Pt able to step to recliner with RW support and mod A +2 for RW management and stability     Balance Overall balance assessment: Needs assistance Sitting-balance support: Feet supported, Bilateral upper extremity supported, Single extremity supported Sitting balance-Leahy Scale: Poor Sitting balance - Comments: CGA sitting EOB   Standing balance support: Bilateral upper extremity supported, Reliant on assistive device for balance, During functional activity Standing balance-Leahy Scale: Poor Standing balance comment: reliant on RW and external support                           ADL either performed or assessed with clinical judgement   ADL Overall ADL's : Needs  assistance/impaired     Grooming: Minimal assistance;Sitting           Upper Body Dressing : Moderate assistance;Sitting   Lower Body Dressing: +2 for physical assistance;Sit to/from stand;Maximal  assistance   Toilet Transfer: Moderate assistance;+2 for physical assistance Toilet Transfer Details (indicate cue type and reason): stand step using RW to recliner         Functional mobility during ADLs: Moderate assistance;+2 for physical assistance      Extremity/Trunk Assessment              Vision   Vision Assessment?: Vision impaired- to be further tested in functional context Additional Comments: continues to require cueing to scan towards L side   Perception Perception Perception: Impaired Preception Impairment Details: Inattention/Neglect Perception-Other Comments: L   Praxis Praxis Praxis: Impaired Praxis Impairment Details: Motor planning;Organization   Communication Communication Communication: Impaired Factors Affecting Communication: Reduced clarity of speech (low volume)   Cognition Arousal: Alert Behavior During Therapy: Flat affect, Impulsive Cognition: Cognition impaired     Awareness: Online awareness impaired Memory impairment (select all impairments): Working [employer] fast streamer, Short-term memory Attention impairment (select first level of impairment): Sustained attention Executive functioning impairment (select all impairments): Problem solving, Reasoning, Sequencing, Organization OT - Cognition Comments: pt oriented, flat affect and limited verbalizations today. continues to demonstrate decreased awareenss and inattention to L side                 Following commands: Impaired Following commands impaired: Follows one step commands with increased time, Follows multi-step commands inconsistently, Follows one step commands inconsistently      Cueing   Cueing Techniques: Verbal cues, Tactile cues, Visual cues  Exercises      Shoulder Instructions       General Comments      Pertinent Vitals/ Pain       Pain Assessment Pain Assessment: Faces Faces Pain Scale: Hurts a little bit Pain Location: throat, bottom Pain Descriptors / Indicators:  Sore Pain Intervention(s): Limited activity within patient's tolerance, Monitored during session, Repositioned  Home Living                                          Prior Functioning/Environment              Frequency  Min 2X/week        Progress Toward Goals  OT Goals(current goals can now be found in the care plan section)  Progress towards OT goals: Progressing toward goals  Acute Rehab OT Goals Patient Stated Goal: none stated OT Goal Formulation: With patient Time For Goal Achievement: 07/30/24 Potential to Achieve Goals: Good  Plan      Co-evaluation    PT/OT/SLP Co-Evaluation/Treatment: Yes Reason for Co-Treatment: Complexity of the patient's impairments (multi-system involvement);Necessary to address cognition/behavior during functional activity;For patient/therapist safety;To address functional/ADL transfers PT goals addressed during session: Mobility/safety with mobility;Balance;Strengthening/ROM OT goals addressed during session: ADL's and self-care      AM-PAC OT 6 Clicks Daily Activity     Outcome Measure   Help from another person eating meals?: A Lot Help from another person taking care of personal grooming?: A Lot Help from another person toileting, which includes using toliet, bedpan, or urinal?: Total Help from another person bathing (including washing, rinsing, drying)?: A Lot Help from another person to put on and taking off regular upper body clothing?: A Lot  Help from another person to put on and taking off regular lower body clothing?: A Lot 6 Click Score: 11    End of Session Equipment Utilized During Treatment: Gait belt;Rolling walker (2 wheels)  OT Visit Diagnosis: Unsteadiness on feet (R26.81);Other abnormalities of gait and mobility (R26.89);Muscle weakness (generalized) (M62.81)   Activity Tolerance Patient tolerated treatment well   Patient Left in chair;with call bell/phone within reach;with chair alarm  set;with family/visitor present   Nurse Communication Mobility status;Need for lift equipment        Time: 8853-8782 OT Time Calculation (min): 31 min  Charges: OT General Charges $OT Visit: 1 Visit OT Treatments $Self Care/Home Management : 8-22 mins  Etta NOVAK, OT Acute Rehabilitation Services Office [phone] Secure Chat Preferred    Etta GORMAN Hope 07/25/2024, 2:07 PM

## 2024-07-25 NOTE — Assessment & Plan Note (Addendum)
 Afebrile today. WBC 15.6>17 today, pt on steroids. He did have a chronic indwelling urinary catheter, and while unlikely cause of the fever, void trial underway. So far failed bladder scans x2, repeat pending at 1800. - Blood culture: No growth 4 days - Covid and flu negative - Vancomycin  D/C'd as MRSA negative, will complete 5 days of Zosyn  (last day today 9/16)  - Robitussin 15 mL q6h - Flomax  0.4 mg currently - If 3rd bladder scan at 1800 shows retention, replace Foley

## 2024-07-25 NOTE — Plan of Care (Signed)
 FMTS Interim Progress Note  S: Patient seen at bedside accompanied by wife. Patient somnolent on exam. Difficult to understand and hear patient's speech as he is very soft-spoken. Per nursing report and per wife, patient is typically more alert during the day, and usually oriented to person, place, and time, but does wax and wane sometimes during the day. At night, he becomes more disoriented and restless, requiring re-direction or PRN's for sedation. On assessment, patient appears to be at a similar baseline to previous nights. Encouraged wife and staff to keep us  informed if patient's mental status deviates significantly from baseline.  O: BP (!) 130/91 (BP Location: Left Arm)   Pulse 98   Temp 98 F (36.7 C) (Axillary)   Resp 19   Ht 5' 9 (1.753 m)   Wt 73.7 kg   SpO2 97%   BMI 23.99 kg/m    General: lying in bed, drowsy, NAD Cardio: regular rate and rhythm; S1/S2 Respiratory: normal respiratory effort; CTAB Abdomen: non-distended; soft Neuro: alert; oriented to name  A/P:  GBM/Seizure Sundowning Stable. Patient appears to be at baseline mental status. --Monitor for changes in baseline mental status; may need STAT CT Head if significant changes to r/o stroke or bleed given that pt is receiving anticoagulation --Can consider extra seroquel  dose of 12.5 mg if pt becomes restless/agitated or non-redirectable   Mannie Ashley SAILOR, MD 07/25/2024, 8:26 PM PGY-1, [facility] Family Medicine Service pager [phone]

## 2024-07-25 NOTE — Assessment & Plan Note (Addendum)
 Alert and oriented this morning, stable overall. - Neurology consulted, recs appreciated - If any acute change in mental status, recommend CT head w/o contrast and CTA head and neck to r/o bleed/stroke - Neurosurgery consulted, appreciate recommendations  - Palliative care consulted, appreciate assistance in patient's care - Consider reassessment by oncology on Wed vs Thurs - Keppra  1,000 mg BID - Versed  prn for clinical seizures - IV Dexamethasone  4 mg Q8H (8am, 1pm, and 6pm along with other nightly medications at 6pm) - Sugar regimen: Lantus  8u at bedtime and SSI while on steroids to maintain glucose 140-180 - STOP ASA 81 mg, continue heparin ; consider transition to Eliquis  tomorrow 9/17 vs 9/18 - Pain regimen: acetaminophen  1000 mg q6h, prn oxycodone  5 mg q4h - Consider addition of Toradol  every 6 hours, try to avoid sedating pain medications if possible - Nausea regimen: Zofran  4 mg PRN - Continuous cardiac monitoring - Neuro checks Q4H - PT/OT to see today - SLP following - Fall and seizure precautions - AM Labs: BMP, Mag, CBC

## 2024-07-25 NOTE — Assessment & Plan Note (Addendum)
 Poor po intake; has smallbore feeding tube left nare. Continue to follow goals of care discussions as above with palliative. - Feeding supplement (Jevity 1.5 Cal/fiber) liquid at 45 mL/hr - Feeding supplement (Prosource TF 20) liquid 60 mL daily - Fiber supplement (Banatrol TF) liquid 60 mL BID - Mupirocin  ointment 2% twice daily - Protonix  40 mg daily - Appreciate nutrition, palliative recs - Considering reduced tube feeds to promote hunger/po feeds

## 2024-07-25 NOTE — Assessment & Plan Note (Addendum)
 As seen on photos from 9/14 AM.  Likely from prolonged immobility and moisture.  Satellite lesions suggestive of fungal etiology. - Clotrimazole  cream twice daily - Gerhardt's butt cream TID  - Desitin 40% ointment  - Turn patient every 2 hours

## 2024-07-25 NOTE — Progress Notes (Signed)
 Daily Progress Note Intern Pager: [phone]  Patient name: Wayne Molina Medical record number: [id number] Date of birth: [birth year] Age: 68 y.o. Gender: male  Primary Care Provider: Janna Ferrier, DO Consultants: Neurosurgery, neurology, oncology  Code Status: DNR-Limited  Pt Overview and Major Events to Date:  8/29-admitted, focal seizure in ED, intubated to the ICU 9/4-extubated 9/8-neurosurgery completed open craniotomy for tumor biopsy and confirmed glioblastoma 9/15-started on heparin   Medical Decision Making:  Wayne Molina is a 68 y.o. male with PMH/PSH of A fib, OSA, osteoporosis, HTN, HLD admitted after seizure and found to have glioblastoma multiforme. Admission complicated by right leg DVT, and following discussion with family, patient now on heparin  for anticoagulation.  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 Alert and oriented this morning, stable overall. - Neurology consulted, recs appreciated - If any acute change in mental status, recommend CT head w/o contrast and CTA head and neck to r/o bleed/stroke - Neurosurgery consulted, appreciate recommendations  - Palliative care consulted, appreciate assistance in patient's care - Consider reassessment by oncology on Wed vs Thurs - Keppra  1,000 mg BID - Versed  prn for clinical seizures - IV Dexamethasone  4 mg Q8H (8am, 1pm, and 6pm along with other nightly medications at 6pm) - Sugar regimen: Lantus  8u at bedtime and SSI while on steroids to maintain glucose 140-180 - STOP ASA 81 mg, continue heparin ; consider transition to Eliquis  tomorrow 9/17 vs 9/18 - Pain regimen: acetaminophen  1000 mg q6h, prn oxycodone  5 mg q4h - Consider addition of Toradol  every 6 hours, try to avoid sedating pain medications if possible - Nausea regimen: Zofran  4 mg PRN - Continuous cardiac monitoring - Neuro checks Q4H - PT/OT to see today - SLP following - Fall and seizure  precautions - AM Labs: BMP, Mag, CBC Fever (Resolved: 07/25/2024) HAP (hospital-acquired pneumonia) Afebrile today. WBC 15.6>17 today, pt on steroids. He did have a chronic indwelling urinary catheter, and while unlikely cause of the fever, void trial underway. So far failed bladder scans x2, repeat pending at 1800. - Blood culture: No growth 4 days - Covid and flu negative - Vancomycin  D/C'd as MRSA negative, will complete 5 days of Zosyn  (last day today 9/16)  - Robitussin 15 mL q6h - Flomax  0.4 mg currently - If 3rd bladder scan at 1800 shows retention, replace Foley Sundowning Improved with current Seroquel  regimen. - Seroquel  25 mg at 6pm; additional Seroquel  12.5 mg at 8pm if patient not resting [per neurology] - Melatonin 3 mg at 6pm Atrial fibrillation with RVR (HCC) Rate controlled. Anticoagulated with heparin  following discussion with family on 9/15. - Coreg  12.5 mg twice daily - Stop ASA, continue IV heparin  Hypertension BP today in 140s-150s/80s. Holding home Losartan . Discontinued hydralazine  9/12. Systolic goal <160.  - Coreg  12.5 mg BID  Malnutrition of moderate degree Poor po intake; has smallbore feeding tube left nare. Continue to follow goals of care discussions as above with palliative. - Feeding supplement (Jevity 1.5 Cal/fiber) liquid at 45 mL/hr - Feeding supplement (Prosource TF 20) liquid 60 mL daily - Fiber supplement (Banatrol TF) liquid 60 mL BID - Mupirocin  ointment 2% twice daily - Protonix  40 mg daily - Appreciate nutrition, palliative recs - Considering reduced tube feeds to promote hunger/po feeds Wound of gluteal cleft As seen on photos from 9/14 AM.  Likely from prolonged immobility and moisture.  Satellite lesions suggestive of fungal etiology. - Clotrimazole  cream twice daily - Gerhardt's butt cream TID  -  Desitin 40% ointment  - Turn patient every 2 hours Thrombosis/embolism, venous DVT US  on 9/14 suggestive of acute intramuscular thrombosis  involving the right gastrocnemius veins.  - Anticoagulation as above (heparin ) Chronic health problem HLD - continue home Atorvastatin  40mg  daily  FEN/GI: Dys 3 PPx: Heparin  Dispo: Pending clinical improvement and continued palliative discussions.  Subjective:  ON: Undocumented void with subsequent bladder scan of 400-500; underwent in and out cath with agitation and given Seroquel . Following bladder scan in 200s.  Today, patient reports he is doing well overall with no concerns. He states the Robitussin has helped with cough. Wife is also present at bedside, reports no concerns.  Objective: Temp:  [97.4 F (36.3 C)-98 F (36.7 C)] 97.8 F (36.6 C) (09/16 1121) Pulse Rate:  [67-86] 86 (09/16 1121) Resp:  [18-19] 18 (09/16 1121) BP: (109-155)/(74-96) 144/86 (09/16 1121) SpO2:  [97 %] 97 % (09/16 0807) Physical Exam: General: Patient lying in bed, no acute distress Cardiovascular: Irregularly irregular, no murmurs/rubs/gallops. Respiratory: Normal work of breathing on room air. Clear to auscultation anteriorly bilaterally; no wheezes, crackles. Abdomen: Bowel sounds present and normoactive bilaterally. Soft, nondistended, nontender. Extremities: Cool, dry, no edema, no pain to palpation of bilateral calves Neuro: Alert and oriented to person, place, time (month and year); patient speaks in a soft voice  Laboratory: Most recent CBC Lab Results  Component Value Date   WBC 17.0 (H) 07/25/2024   HGB 16.5 07/25/2024   HCT 49.5 07/25/2024   MCV 91.8 07/25/2024   PLT 529 (H) 07/25/2024   Most recent BMP    Latest Ref Rng & Units 07/25/2024    5:13 AM  BMP  Glucose 70 - 99 mg/dL 826   BUN 8 - 23 mg/dL 34   Creatinine 9.38 - 1.24 mg/dL 9.14   Sodium 864 - 854 mmol/L 134   Potassium 3.5 - 5.1 mmol/L 5.0   Chloride 98 - 111 mmol/L 103   CO2 22 - 32 mmol/L 20   Calcium  8.9 - 10.3 mg/dL 8.7     Larraine Palma, MD 07/25/2024, 12:50 PM  PGY-1, [facility] FPTS Intern pager: [phone], text pages welcome Secure chat group [facility] Teaching Service

## 2024-07-25 NOTE — Assessment & Plan Note (Addendum)
 BP today in 140s-150s/80s. Holding home Losartan . Discontinued hydralazine  9/12. Systolic goal <160.  - Coreg  12.5 mg BID

## 2024-07-25 NOTE — Assessment & Plan Note (Addendum)
 Improved with current Seroquel  regimen. - Seroquel  25 mg at 6pm; additional Seroquel  12.5 mg at 8pm if patient not resting [per neurology] - Melatonin 3 mg at 6pm

## 2024-07-25 NOTE — Assessment & Plan Note (Addendum)
 DVT US  on 9/14 suggestive of acute intramuscular thrombosis involving the right gastrocnemius veins.  - Anticoagulation as above (heparin )

## 2024-07-25 NOTE — Progress Notes (Signed)
 Speech Language Pathology Treatment: Dysphagia;Cognitive-Linguistic  Patient Details Name: Wayne Molina MRN: [id number] DOB: 1956-06-24 Today's Date: 07/25/2024 Time: 8995-8975 SLP Time Calculation (min) (ACUTE ONLY): 20 min  Assessment / Plan / Recommendation Clinical Impression  Patient seen by SLP for skilled treatment focused on dysphagia and cognitive goals. His brother, sister and law and his spouse were all in the room. His spouse reported that he continues to only eat a little bit of food from meal trays and is still becoming agitated and restless at night. She told SLP that last night, patient had one leg out of the bed with foot on the floor and the other leg was still in the bed. Patient was drowsy but able to maintain adequate alertness for some PO's and to answer questions. His voice was very low in intensity and SLP only able to hear him with ear close to his mouth. He did accurately report the PO's he ate for breakfast (verified by spouse) and he was able to follow basic one-step directions. He told SLP that his throat was hurting (spouse indicated he just started talking about this today) and he was requesting some throat spray. He drank some sips of water  from water  bottle, exhibiting one instance of fairly immediate cough, but reporting it helped his throat feel better. Patient's brother brought him an apple pie from [employer] as well as some coffee (luke warm). Patient took sips of coffee and bites of the apple pie with mildly prolonged mastication but no overt s/s aspiration.  SLP will continue to follow.     HPI HPI: Patient is a 68 y.o. man who presented to [facility] 8/28, with AMS, Code Stroke. PMHx significant for HTN, HLD, Afib on Eliquis , untreated OSA. Patient presented to ER as a Code Stroke after coworker noticed he was altered since 8/28 around 2230 and pale, patient reported feeling dizzy. In ER, noted to have L sided weakness and left hemianopsia. CT Head initially concerning  for right posterior MCA stroke but CTA negative for LVO. Repeat CT Head concerning for mass. Course complicated by seizure episode. Intubated 8/29, extubated 9/4 but still with difficulty managing secreations. 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 Patient was intubated for open craniotomy for biopsy (right head) on 09/08 and extubated in OR.      SLP Plan  Continue with current plan of care          Recommendations  Diet recommendations: Dysphagia 3 (mechanical soft);Thin liquid Liquids provided via: Cup;Straw Medication Administration: Other (Comment) (meds whole with puree or liquids as tolerated) Compensations: Minimize environmental distractions;Slow rate;Small sips/bites Postural Changes and/or Swallow Maneuvers: Seated upright 90 degrees                  Oral care BID   Frequent or constant Supervision/Assistance Cognitive communication deficit (R41.841);Dysphagia, unspecified (R13.10)     Continue with current plan of care     Norleen IVAR Blase, MA, CCC-SLP Speech Therapy

## 2024-07-25 NOTE — Assessment & Plan Note (Addendum)
 Rate controlled. Anticoagulated with heparin  following discussion with family on 9/15. - Coreg  12.5 mg twice daily - Stop ASA, continue IV heparin 

## 2024-07-25 NOTE — Progress Notes (Signed)
 Physical Therapy Treatment Patient Details Name: Wayne Molina MRN: [id number] DOB: [birth year] Today's Date: 07/25/2024   History of Present Illness Pt is a 68 y/o male presenting on 8/28 with L arm weakness, L hemianopsia, and slurred speech. 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 Course complicated by seizure episode. Intubated 8/29, extubated 9/4 but still with difficulty managing secretions.  Biopsy on 9/8. PMH includes: anxiety, HLD, HTN, sleep apnea, chronic afib.    PT Comments  Pt received in supine and agreeable to session. Pt requires cues for sequencing and safety throughout session. Pt continues to demonstrate difficulty reaching an upright posture due to hip and knee flexion, however improves with RW support. Pt able to take steps to the recliner and perform an additional stand with focus on anterior weight shift. Pt continues to benefit from PT services to progress toward functional mobility goals.    If plan is discharge home, recommend the following: Two people to help with walking and/or transfers;Two people to help with bathing/dressing/bathroom;Assistance with cooking/housework;Assistance with feeding;Direct supervision/assist for medications management;Assist for transportation;Direct supervision/assist for financial management;Supervision due to cognitive status   Can travel by Doctor, hospital (measurements PT);Wheelchair cushion (measurements PT);Hospital bed;Hoyer lift    Recommendations for Other Services       Precautions / Restrictions Precautions Precautions: Fall;Other (comment) Recall of Precautions/Restrictions: Impaired Precaution/Restrictions Comments: cortrak, watch vitals, SBP <160 Restrictions Weight Bearing Restrictions Per Provider Order: No     Mobility  Bed Mobility Overal bed mobility: Needs Assistance Bed  Mobility: Rolling, Sidelying to Sit Rolling: Min assist Sidelying to sit: Mod assist, +2 for physical assistance, +2 for safety/equipment       General bed mobility comments: cues for sequencing and assist for LE advancement and trunk elevation    Transfers Overall transfer level: Needs assistance Equipment used: 2 person hand held assist, Rolling walker (2 wheels) Transfers: Sit to/from Stand, Bed to chair/wheelchair/BSC Sit to Stand: +2 physical assistance, +2 safety/equipment, Max assist, Mod assist   Step pivot transfers: Mod assist, +2 physical assistance, +2 safety/equipment       General transfer comment: From EOB initially with 2 HHA then RW with max A +2 to achieve upright posture due to flexed hips and knees and posterior bias. Progressed to mod A +2 from recliner with cues for hand placement and assist for anterior weight shift. Pt able to step to recliner with RW support and mod A +2 for RW management and stability    Ambulation/Gait                   Stairs             Wheelchair Mobility     Tilt Bed    Modified Rankin (Stroke Patients Only) Modified Rankin (Stroke Patients Only) Pre-Morbid Rankin Score: No symptoms Modified Rankin: Severe disability     Balance Overall balance assessment: Needs assistance Sitting-balance support: Feet supported, Bilateral upper extremity supported, Single extremity supported Sitting balance-Leahy Scale: Poor Sitting balance - Comments: CGA sitting EOB   Standing balance support: Bilateral upper extremity supported, Reliant on assistive device for balance, During functional activity Standing balance-Leahy Scale: Poor Standing balance comment: reliant on RW and external support  Communication Communication Communication: Impaired Factors Affecting Communication: Reduced clarity of speech (low volume)  Cognition Arousal: Alert Behavior During Therapy: Flat affect    PT - Cognitive impairments: Difficult to assess Difficult to assess due to: Impaired communication                       Following commands: Impaired Following commands impaired: Follows one step commands with increased time, Follows multi-step commands inconsistently, Follows one step commands inconsistently    Cueing Cueing Techniques: Verbal cues, Tactile cues, Visual cues  Exercises      General Comments        Pertinent Vitals/Pain Pain Assessment Pain Assessment: Faces Faces Pain Scale: Hurts a little bit Pain Location: throat, bottom Pain Descriptors / Indicators: Sore Pain Intervention(s): Monitored during session     PT Goals (current goals can now be found in the care plan section) Acute Rehab PT Goals Patient Stated Goal: to return to work PT Goal Formulation: With patient/family Time For Goal Achievement: 07/29/24 Progress towards PT goals: Progressing toward goals    Frequency    Min 3X/week      PT Plan      Co-evaluation PT/OT/SLP Co-Evaluation/Treatment: Yes Reason for Co-Treatment: Complexity of the patient's impairments (multi-system involvement);Necessary to address cognition/behavior during functional activity;For patient/therapist safety;To address functional/ADL transfers PT goals addressed during session: Mobility/safety with mobility;Balance;Strengthening/ROM        AM-PAC PT 6 Clicks Mobility   Outcome Measure  Help needed turning from your back to your side while in a flat bed without using bedrails?: A Lot Help needed moving from lying on your back to sitting on the side of a flat bed without using bedrails?: A Lot Help needed moving to and from a bed to a chair (including a wheelchair)?: Total Help needed standing up from a chair using your arms (e.g., wheelchair or bedside chair)?: Total Help needed to walk in hospital room?: Total Help needed climbing 3-5 steps with a railing? : Total 6 Click Score: 8    End of  Session Equipment Utilized During Treatment: Gait belt Activity Tolerance: Patient tolerated treatment well Patient left: in chair;with chair alarm set;with call bell/phone within reach;with family/visitor present Nurse Communication: Mobility status;Need for lift equipment PT Visit Diagnosis: Unsteadiness on feet (R26.81);Other abnormalities of gait and mobility (R26.89);Muscle weakness (generalized) (M62.81);Difficulty in walking, not elsewhere classified (R26.2);Other symptoms and signs involving the nervous system (R29.898)     Time: 8853-8783 PT Time Calculation (min) (ACUTE ONLY): 30 min  Charges:    $Therapeutic Activity: 8-22 mins PT General Charges $$ ACUTE PT VISIT: 1 Visit                     Darryle George, PTA Acute Rehabilitation Services Secure Chat Preferred  [phone]    Darryle George 07/25/2024, 12:59 PM

## 2024-07-26 ENCOUNTER — Inpatient Hospital Stay (HOSPITAL_COMMUNITY)

## 2024-07-26 DIAGNOSIS — C719 Malignant neoplasm of brain, unspecified: Secondary | ICD-10-CM | POA: Diagnosis not present

## 2024-07-26 DIAGNOSIS — I8290 Acute embolism and thrombosis of unspecified vein: Secondary | ICD-10-CM | POA: Diagnosis not present

## 2024-07-26 DIAGNOSIS — D497 Neoplasm of unspecified behavior of endocrine glands and other parts of nervous system: Secondary | ICD-10-CM | POA: Diagnosis not present

## 2024-07-26 DIAGNOSIS — R569 Unspecified convulsions: Secondary | ICD-10-CM | POA: Diagnosis not present

## 2024-07-26 DIAGNOSIS — R339 Retention of urine, unspecified: Secondary | ICD-10-CM | POA: Insufficient documentation

## 2024-07-26 LAB — GLUCOSE, CAPILLARY
Glucose-Capillary: 130 mg/dL — ABNORMAL HIGH (ref 70–99)
Glucose-Capillary: 173 mg/dL — ABNORMAL HIGH (ref 70–99)
Glucose-Capillary: 182 mg/dL — ABNORMAL HIGH (ref 70–99)
Glucose-Capillary: 206 mg/dL — ABNORMAL HIGH (ref 70–99)
Glucose-Capillary: 216 mg/dL — ABNORMAL HIGH (ref 70–99)
Glucose-Capillary: 237 mg/dL — ABNORMAL HIGH (ref 70–99)

## 2024-07-26 LAB — CBC
HCT: 48.7 % (ref 39.0–52.0)
Hemoglobin: 15.9 g/dL (ref 13.0–17.0)
MCH: 30.6 pg (ref 26.0–34.0)
MCHC: 32.6 g/dL (ref 30.0–36.0)
MCV: 93.8 fL (ref 80.0–100.0)
Platelets: 395 K/uL (ref 150–400)
RBC: 5.19 MIL/uL (ref 4.22–5.81)
RDW: 12.4 % (ref 11.5–15.5)
WBC: 14 K/uL — ABNORMAL HIGH (ref 4.0–10.5)
nRBC: 0 % (ref 0.0–0.2)

## 2024-07-26 LAB — BASIC METABOLIC PANEL WITH GFR
Anion gap: 17 — ABNORMAL HIGH (ref 5–15)
BUN: 38 mg/dL — ABNORMAL HIGH (ref 8–23)
CO2: 16 mmol/L — ABNORMAL LOW (ref 22–32)
Calcium: 8.7 mg/dL — ABNORMAL LOW (ref 8.9–10.3)
Chloride: 102 mmol/L (ref 98–111)
Creatinine, Ser: 0.92 mg/dL (ref 0.61–1.24)
GFR, Estimated: >60 mL/min (ref >60)
Glucose, Bld: 156 mg/dL — ABNORMAL HIGH (ref 70–99)
Potassium: 5 mmol/L (ref 3.5–5.1)
Sodium: 135 mmol/L (ref 135–145)

## 2024-07-26 LAB — CULTURE, BLOOD (ROUTINE X 2)
Culture: NO GROWTH
Culture: NO GROWTH

## 2024-07-26 LAB — HEPARIN LEVEL (UNFRACTIONATED)
Heparin Unfractionated: 0.19 [IU]/mL — ABNORMAL LOW (ref 0.30–0.70)
Heparin Unfractionated: 0.59 [IU]/mL (ref 0.30–0.70)

## 2024-07-26 LAB — MAGNESIUM: Magnesium: 2.3 mg/dL (ref 1.7–2.4)

## 2024-07-26 MED ORDER — FREE WATER
60.0000 mL | Status: DC
  Administered 2024-07-26 – 2024-07-28 (×12): 60 mL

## 2024-07-26 MED ORDER — KATE FARMS STANDARD 1.4 EN LIQD
1000.0000 mL | Freq: Every day | ENTERAL | Status: DC
  Administered 2024-07-26 – 2024-07-27 (×2): 1000 mL via ORAL
  Filled 2024-07-26 (×3): qty 1000

## 2024-07-26 MED ORDER — BANATROL TF EN LIQD
60.0000 mL | Freq: Two times a day (BID) | ENTERAL | Status: DC
  Administered 2024-07-26 – 2024-07-27 (×3): 60 mL
  Filled 2024-07-26 (×3): qty 60

## 2024-07-26 MED ORDER — PROSOURCE TF20 ENFIT COMPATIBL EN LIQD
60.0000 mL | Freq: Every day | ENTERAL | Status: DC
  Administered 2024-07-27 – 2024-07-31 (×5): 60 mL
  Filled 2024-07-26 (×5): qty 60

## 2024-07-26 MED ORDER — DEXAMETHASONE SODIUM PHOSPHATE 4 MG/ML IJ SOLN
2.0000 mg | Freq: Three times a day (TID) | INTRAMUSCULAR | Status: DC
  Administered 2024-07-27 (×2): 2 mg via INTRAVENOUS
  Filled 2024-07-26 (×2): qty 1

## 2024-07-26 NOTE — Assessment & Plan Note (Signed)
 Remains improved on current regimen. - Seroquel  25 mg at 6pm; additional Seroquel  12.5 mg at 8pm if patient not resting [per neurology] - Melatonin 3 mg at 6pm

## 2024-07-26 NOTE — Assessment & Plan Note (Signed)
 Poor po intake; has smallbore feeding tube left nare. Continue to follow goals of care discussions as above with palliative. RD continues to follow. - Feeding supplement (Jevity 1.5 Cal/fiber) liquid at 45 mL/hr - Feeding supplement (Prosource TF 20) liquid 60 mL daily - Fiber supplement (Banatrol TF) liquid 60 mL BID - Mupirocin  ointment 2% twice daily - Protonix  40 mg daily - Appreciate nutrition, palliative recs - Considering reduced tube feeds to promote hunger/po feeds

## 2024-07-26 NOTE — Assessment & Plan Note (Signed)
 As seen on photos from 9/14 AM.  Likely from prolonged immobility and moisture.  Satellite lesions suggestive of fungal etiology. - Clotrimazole  cream twice daily - Gerhardt's butt cream TID  - Desitin 40% ointment  - Turn patient every 2 hours

## 2024-07-26 NOTE — Progress Notes (Signed)
 PHARMACY - ANTICOAGULATION CONSULT NOTE  Pharmacy Consult for Heparin  Indication: hx afib, new DVT  No Known Allergies  Patient Measurements: Height: 5' 9 (175.3 cm) Weight: 76.2 kg (168 lb) IBW/kg (Calculated) : 70.7 HEPARIN  DW (KG): 73.9  Vital Signs: Temp: 97.6 F (36.4 C) (09/17 1711) Temp Source: Oral (09/17 1711) BP: 163/98 (09/17 1711) Pulse Rate: 102 (09/17 1711)  Labs: Recent Labs    07/24/24 0616 07/24/24 0616 07/24/24 0843 07/25/24 0513 07/25/24 1335 07/26/24 0800 07/26/24 1853  HGB 15.6  --   --  16.5  --  15.9  --   HCT 47.5  --   --  49.5  --  48.7  --   PLT 406*  --   --  529*  --  395  --   HEPARINUNFRC  --    < >  --  0.31 0.38 0.19* 0.59  CREATININE  --   --  0.76 0.85  --  0.92  --    < > = values in this interval not displayed.    Estimated Creatinine Clearance: 74.7 mL/min (by C-G formula based on SCr of 0.92 mg/dL).   Assessment: History of a fib on apixaban  PTA.  Anticoagulation held for brain biopsy.  Noted new distal DVT while off anticoagulation. Restarted heparin  cautiously 9/15 per team, no bolus.  Plan for repeat head CT 9/17 and if stable, plan to switch to apixaban .  Heparin  level therapeutic (0.59) on infusion at 1250 units/hr. No bleeding noted.  Goal of Therapy:  Heparin  level 0.3-0.7 units/ml Monitor platelets by anticoagulation protocol: Yes   Plan:  Continu heparin  infusion at 1250 units/hr F/u a.m. heparin  level and CBC  Vito Ralph, PharmD, BCPS Please see amion for complete clinical pharmacist phone list 07/26/2024 7:26 PM

## 2024-07-26 NOTE — Assessment & Plan Note (Addendum)
 Rate controlled. Anticoagulated with heparin  following discussion with family on 9/15. - Coreg  12.5 mg twice daily - Anticoagulation with Heparin 

## 2024-07-26 NOTE — Progress Notes (Signed)
 PHARMACY - ANTICOAGULATION CONSULT NOTE  Pharmacy Consult for Heparin  Indication: hx afib, new DVT  No Known Allergies  Patient Measurements: Height: 5' 9 (175.3 cm) Weight: 76.2 kg (168 lb) IBW/kg (Calculated) : 70.7 HEPARIN  DW (KG): 73.9  Vital Signs: Temp: 98 F (36.7 C) (09/17 0816) Temp Source: Oral (09/17 0816) BP: 117/85 (09/17 0816) Pulse Rate: 67 (09/17 0816)  Labs: Recent Labs    07/24/24 0616 07/24/24 0843 07/25/24 0513 07/25/24 1335 07/26/24 0800  HGB 15.6  --  16.5  --  15.9  HCT 47.5  --  49.5  --  48.7  PLT 406*  --  529*  --  395  HEPARINUNFRC  --   --  0.31 0.38 0.19*  CREATININE  --  0.76 0.85  --  0.92    Estimated Creatinine Clearance: 74.7 mL/min (by C-G formula based on SCr of 0.92 mg/dL).   Medical History: Past Medical History:  Diagnosis Date   Anxiety    Arthritis    fingers   Chronic atrial fibrillation (HCC)    Depression    Family history of breast cancer    Family history of cancer of male genital organ    Family history of gene mutation    BRIP1   Family history of malignant neoplasm of gastrointestinal tract    Hyperlipidemia    Hypertension    Personal history of colonic polyps 11/12/2006   Sleep apnea     Medications:  Infusions:   feeding supplement (JEVITY 1.5 CAL/FIBER) 45 mL/hr at 07/26/24 1000   heparin  1,100 Units/hr (07/26/24 1000)    Assessment: History of a fib on apixaban  PTA.  Anticoagulation held for brain biopsy.  Noted new distal DVT while off anticoagulation. Restarted heparin  cautiously 9/15 per team, no bolus.  Plan for repeat head CT 9/17 and if stable, plan to switch to apixaban .  Heparin  subtherapeutic on 1100 units/hr.  Goal of Therapy:  Heparin  level 0.3-0.7 units/ml Monitor platelets by anticoagulation protocol: Yes   Plan:  Increase heparin  infusion to 1250 units/hr Repeat heparin  level in 6 hours Follow-up repeat head CT and plan for apixaban  Daily heparin  level and CBC Monitor  closely for bleeding, neuro changes  Brendy Ficek, Pharm.D., BCPS Clinical Pharmacist Clinical phone for 07/26/2024 from 7:30-3:00 is [phone].  **Pharmacist phone directory can be found on [email or web address] listed under [facility].  07/26/2024 10:40 AM

## 2024-07-26 NOTE — Progress Notes (Signed)
 Physical Therapy Treatment Patient Details Name: Wayne Molina MRN: [id number] DOB: 04-05-1956 Today's Date: 07/26/2024   History of Present Illness Pt is a 68 y/o male presenting on 8/28 with L arm weakness, L hemianopsia, and slurred speech. 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 Course complicated by seizure episode. Intubated 8/29, extubated 9/4 but still with difficulty managing secretions.  Biopsy on 9/8. +DVT R LE 9/14.  PMH includes: anxiety, HLD, HTN, sleep apnea, chronic afib.   PT Comments  Pt in bed upon arrival and agreeable to PT session. Limited PT session as pt has bowel incontinence upon standing. Able to perform x2 sit<>stand repetitions with ModAx2 and use of RW. Pt then able to stand for a few minutes while staff performed pericare. Increased difficulty maintaining upright posture with knees and hips flexed. Returned to supine due to continued loose stool, RN notified. Pt fatigued after movement with discussion with spouse on LE HEP to perform during the day. Continue to recommend >3hrs post acute rehab with acute PT to follow.     If plan is discharge home, recommend the following: Two people to help with walking and/or transfers;Two people to help with bathing/dressing/bathroom;Assistance with cooking/housework;Assistance with feeding;Direct supervision/assist for medications management;Assist for transportation;Direct supervision/assist for financial management;Supervision due to cognitive status   Can travel by private vehicle      No  Equipment Recommendations  Wheelchair (measurements PT);Wheelchair cushion (measurements PT);Hospital bed;Hoyer lift       Precautions / Restrictions Precautions Precautions: Fall;Other (comment) Recall of Precautions/Restrictions: Impaired Precaution/Restrictions Comments: cortrak, watch vitals, SBP <160 Restrictions Weight Bearing Restrictions Per  Provider Order: No     Mobility  Bed Mobility Overal bed mobility: Needs Assistance Bed Mobility: Rolling, Sidelying to Sit, Sit to Supine Rolling: Min assist Sidelying to sit: Mod assist, +2 for physical assistance, +2 for safety/equipment   Sit to supine: Max assist, +2 for physical assistance   General bed mobility comments: able to bring LE's towards EOB with MinA to roll and ModAx2 to raise trunk and complete moving LE's off EOB. Assist needed to shift hips forward with use of bed pad    Transfers Overall transfer level: Needs assistance Equipment used: Rolling walker (2 wheels) Transfers: Sit to/from Stand Sit to Stand: +2 physical assistance, +2 safety/equipment, Mod assist    General transfer comment: ModAx2 for boost-up and steadying assist with cues for hand placement for RW. Unable to achieve upright posture due to flexed hips and knees with posterior bias. Unable to progress gait due to bowel incontinence    Ambulation/Gait  General Gait Details: unable this date     Modified Rankin (Stroke Patients Only) Modified Rankin (Stroke Patients Only) Pre-Morbid Rankin Score: No symptoms Modified Rankin: Severe disability     Balance Overall balance assessment: Needs assistance Sitting-balance support: Feet supported, Bilateral upper extremity supported, Single extremity supported Sitting balance-Leahy Scale: Poor Sitting balance - Comments: ModA for left lateral lean Postural control: Left lateral lean Standing balance support: Bilateral upper extremity supported, Reliant on assistive device for balance, During functional activity Standing balance-Leahy Scale: Poor Standing balance comment: reliant on RW and external support       Communication Communication Communication: Impaired Factors Affecting Communication: Reduced clarity of speech (low volume)  Cognition Arousal: Alert Behavior During Therapy: Flat affect, Impulsive   PT - Cognitive impairments:  Difficult to assess Difficult to assess due to: Impaired communication  PT - Cognition Comments: Soft spoken with difficulty understanding pt. Decreased L attention Following commands: Impaired Following commands impaired: Follows one step commands with increased time, Follows multi-step commands inconsistently, Follows one step commands inconsistently    Cueing Cueing Techniques: Verbal cues, Tactile cues, Visual cues  Exercises Other Exercises Other Exercises: trunk shifts to the right, cues to bring pt's shoulder to PT's shoulder    General Comments General comments (skin integrity, edema, etc.): Discussed with wife to encourage pt to perform ankle pumps, SLR, hip ABD/ADD, and heel slides throughout the day. Discussed how to support to leg if pt is needing more assistance      Pertinent Vitals/Pain Pain Assessment Pain Assessment: No/denies pain Pain Intervention(s): Monitored during session     PT Goals (current goals can now be found in the care plan section) Acute Rehab PT Goals PT Goal Formulation: With patient/family Time For Goal Achievement: 07/29/24 Potential to Achieve Goals: Fair Progress towards PT goals: Progressing toward goals    Frequency    Min 3X/week       AM-PAC PT 6 Clicks Mobility   Outcome Measure  Help needed turning from your back to your side while in a flat bed without using bedrails?: A Lot Help needed moving from lying on your back to sitting on the side of a flat bed without using bedrails?: Total Help needed moving to and from a bed to a chair (including a wheelchair)?: Total Help needed standing up from a chair using your arms (e.g., wheelchair or bedside chair)?: Total Help needed to walk in hospital room?: Total Help needed climbing 3-5 steps with a railing? : Total 6 Click Score: 7    End of Session Equipment Utilized During Treatment: Gait belt Activity Tolerance: Patient tolerated treatment well Patient left: in bed;with  call bell/phone within reach;with bed alarm set Nurse Communication: Mobility status;Other (comment) (bowel incontinence) PT Visit Diagnosis: Unsteadiness on feet (R26.81);Other abnormalities of gait and mobility (R26.89);Muscle weakness (generalized) (M62.81);Difficulty in walking, not elsewhere classified (R26.2);Other symptoms and signs involving the nervous system (R29.898)     Time: 8543-8473 PT Time Calculation (min) (ACUTE ONLY): 30 min  Charges:    $Therapeutic Activity: 23-37 mins PT General Charges $$ ACUTE PT VISIT: 1 Visit                    Kate ORN, PT, DPT Secure Chat Preferred  Rehab Office [phone]   Kate BRAVO [name] 07/26/2024, 4:10 PM

## 2024-07-26 NOTE — Assessment & Plan Note (Addendum)
 HTN - BP today 140s/60s. Continue home Coreg  12.5 mg BID. Holding home Losartan . Discontinued hydralazine  9/12. Systolic goal <160.  HLD - continue home Atorvastatin  40mg  daily

## 2024-07-26 NOTE — Plan of Care (Signed)
 Went to see patient and family to discuss switching from heparin  to Eliquis  for anticoagulation.  Informed family that it would be difficult to find placement on an IV such as heparin , and that Eliquis  would also provide adequate protection against clots.  Inform family that there is a reversal agent in the event of bleed, but also reassured family that the CT head done earlier today showed a resolving bleed post brain biopsy.  All questions were answered, and family elected to wait on this decision until tomorrow morning so that they could confer with a few additional family members.  Also informed family that, in conjunction with neurology, we would be reducing the steroid (Decadron ) dose, and they were fine with this.  As an aside, patient was eating dinner!

## 2024-07-26 NOTE — Progress Notes (Signed)
 Subjective: I had some intermittent confusion in the evening trying to get out of bed.  This morning states his throat hurts therefore is unable to eat.  Does have a spray to minimize throat pain.  Per wife, did eat well yesterday  ROS: negative except above  Examination  Vital signs in last 24 hours: Temp:  [97.7 F (36.5 C)-98 F (36.7 C)] 98 F (36.7 C) (09/17 0816) Pulse Rate:  [67-98] 67 (09/17 0816) Resp:  [16-19] 16 (09/17 0816) BP: (117-144)/(67-91) 117/85 (09/17 0816) SpO2:  [99 %-100 %] 100 % (09/17 0816) Weight:  [76.2 kg] 76.2 kg (09/17 0004)  General: lying in bed, NAD Neuro:Awake, alert, oriented to person and place, follows commands, antigravity strength in all 4 extremities    Basic Metabolic Panel: Recent Labs  Lab 07/22/24 0211 07/23/24 0633 07/24/24 0843 07/25/24 0513 07/26/24 0800  NA 131* 135 135 134* 135  K 4.5 4.3 4.9 5.0 5.0  CL 101 99 101 103 102  CO2 22 25 23  20* 16*  GLUCOSE 246* 158* 187* 173* 156*  BUN 27* 27* 29* 34* 38*  CREATININE 0.80 0.79 0.76 0.85 0.92  CALCIUM  8.6* 8.7* 8.7* 8.7* 8.7*  MG  --   --   --   --  2.3    CBC: Recent Labs  Lab 07/21/24 1043 07/22/24 0211 07/23/24 0633 07/24/24 0616 07/25/24 0513 07/26/24 0800  WBC 49.4* 43.7* 24.5* 15.6* 17.0* 14.0*  NEUTROABS 47.9*  --   --   --   --   --   HGB 13.5 13.1 14.2 15.6 16.5 15.9  HCT 39.8 38.6* 42.0 47.5 49.5 48.7  MCV 91.1 91.3 90.7 94.8 91.8 93.8  PLT 372 401* 525* 406* 529* 395     Coagulation Studies: No results for input(s): LABPROT, INR in the last 72 hours.  Imaging Repeat CT head ordered and pending  ASSESSMENT AND PLAN:68 y.o. 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  If no hemorrhagic conversion, plan to switch to Eliquis  -Of note,  patient will need stat CT head for any acute worsening of mental status or new neurological deficits once we resume eliquis  - Continue Seroquel  25 mg at 6 PM to minimize delirium and sundowning with additional 12.5mg  at 2000 if needed - If CT head is stable, recommend reducing dexamethasone  to 2 mg every 8 hours -Continue to encourage p.o. diet and minimize tube feeds -As needed Versed  for clinical seizures -Discussed plan with wife at bedside and medicine team via secure chat       I personally spent a total of 37 minutes in the care of the patient today including getting/reviewing separately obtained history, performing a medically appropriate exam/evaluation, counseling and educating, placing orders, referring and communicating with other health care professionals, documenting clinical information in the EHR, independently interpreting results, and coordinating care.      Wayne Molina [facility] For questions after 5pm please refer to AMION to reach the Neurologist on call

## 2024-07-26 NOTE — Progress Notes (Signed)
 Nutrition Follow-up  DOCUMENTATION CODES:  Non-severe (moderate) malnutrition in context of chronic illness  INTERVENTION:  Continue current diet as ordered Encourage PO intake Family to assist with ordering meals Ensure Plus High Protein po QID, each supplement provides 350 kcal and 20 grams of protein, pt prefers chocolate Continue tube feeding via Cortrak. Change to the following: Mallie Pinion 1.4 @ 50 mL/hr (1200 mL/day) PROSource TF20 60 mL 1x/d 60mL free water  q4 hours Tube feeding regimen provides 1760 kcal, 94 grams of protein, and of H2O. Banatrol BID to aid in thickening stools  NUTRITION DIAGNOSIS:  Moderate Malnutrition related to chronic illness as evidenced by moderate fat depletion, severe muscle depletion. - remains applicable  GOAL:  Patient will meet greater than or equal to 90% of their needs - progressing  MONITOR:  PO intake, TF tolerance, I & O's, Skin  REASON FOR ASSESSMENT:  Consult, Ventilator Enteral/tube feeding initiation and management  ASSESSMENT:  Pt admitted with AMS, found to have new brain mass. PMH significant for afib on eliquis , HTN, HLD.  8/28: admitted 8/29: intubated d/t acute hypoxic respiratory failure 8/31: tube feeding initiated 9/2 - Cortrak placed, tip gastric  9/4 - extubated 9/8 - Op, right craniotomy to obtain brain tumor biopsy 9/9 - SLP BSE, DYS2, thin 9/10 - Diet changed to DYS3  Pt resting in bed at the time of assessment, just got cleaned up from having BM. Wife at bedside. Flowsheet documentation shows only 2 BM in the last 24 hours, but wife states she thinks he also had two overnight. RN reports that stool this AM was still liquid. Will change formula to [facility] to assess for tolerance.   Pt still drowsy and mostly keeps his eyes closed but will respond when spoken to. Voice very quiet and very hard to understand. Discussed intake yesterday. Wife states he ate 1/2 of an apple pie from [employer] for breakfast  and ~25% of his dinner tray which is more than he has eaten previously. Also sipped on some ensure but didn't drink an entire container. Refused breakfast this AM. Pt reports that his throat is hurting and he is also tired of having loose stools.   Spent a fair amount of time discussing long-term feeding plan. Did discuss with wife and pt that he couldn't leave the hospital with the cortrak tube. Also discussed that a PEG could be a more permanent but still temporary means for ensuring he gets nutrition while allowing him the ability to get out of the hospital. Wife to discuss with family and will let care team know what they decide. Do think it would be helpful for family to hear from Oncology team and pt's prognosis with and without treatment and if he would be a candidate for treatment at this time.   Admit weight: 73.9 kg  Current weight: 76.2 kg    Nutritionally Relevant Medications: Scheduled Meds:  atorvastatin   40 mg Oral Daily   dexamethasone   4 mg Intravenous Q8H   Ensure Plus High Protein  237 mL Oral TID PC & HS   PROSource TF20  60 mL Per Tube BID   insulin  aspart  0-15 Units Subcutaneous Q4H   insulin  glargine  8 Units Subcutaneous QHS   pantoprazole   40 mg Oral Daily   Continuous Infusions:  feeding supplement (JEVITY 1.5 CAL/FIBER) 45 mL/hr at 07/26/24 1000   PRN Meds: ondansetron    Labs Reviewed: BUN 29 CBG ranges from 142-244 mg/dL over the last 24 hours HgbA1c 6.0% (  8/29)  NUTRITION - FOCUSED PHYSICAL EXAM: Flowsheet Row Most Recent Value  Orbital Region Moderate depletion  Upper Arm Region Mild depletion  Thoracic and Lumbar Region Mild depletion  Buccal Region Moderate depletion  Temple Region Severe depletion  Clavicle Bone Region Moderate depletion  Clavicle and Acromion Bone Region Moderate depletion  Scapular Bone Region Unable to assess  Dorsal Hand Moderate depletion  Patellar Region Severe depletion  Anterior Thigh Region Severe depletion  Posterior  Calf Region Moderate depletion  Edema (RD Assessment) None  Hair Reviewed  Eyes Reviewed  Mouth Reviewed  Skin Reviewed  Nails Reviewed    Diet Order:   Diet Order             DIET DYS 3 Room service appropriate? Yes; Fluid consistency: Thin  Diet effective now                   EDUCATION NEEDS:  Education needs have been addressed  Skin:  Skin Assessment: Skin Integrity Issues: (irritant dermatitis, buttocks)  Last BM:  9/16 - type 6 x 2  Height:  Ht Readings from Last 1 Encounters:  07/07/24 5' 9 (1.753 m)    Weight:  Wt Readings from Last 1 Encounters:  07/26/24 76.2 kg    Ideal Body Weight:  72.7 kg  BMI:  Body mass index is 24.81 kg/m.  Estimated Nutritional Needs:  Kcal:  1800-2000 Protein:  95-110g Fluid:  >/=1.8L    Vernell Lukes, RD, LDN, CNSC Registered Dietitian II Please reach out via secure chat

## 2024-07-26 NOTE — Assessment & Plan Note (Deleted)
 BP today 140s/60s. Holding home Losartan . Discontinued hydralazine  9/12. Systolic goal <160.  - Coreg  12.5 mg BID

## 2024-07-26 NOTE — Assessment & Plan Note (Addendum)
 DVT US  on 9/14 suggestive of acute intramuscular thrombosis involving the right gastrocnemius veins. - Anticoagulation as above (heparin )

## 2024-07-26 NOTE — Assessment & Plan Note (Addendum)
 Remains afebrile. WBC 17>14 today; pt on steroids so some elevation expected. S/p Zosyn  (9/12-9/16). - Blood culture: No growth 5 days.

## 2024-07-26 NOTE — Assessment & Plan Note (Addendum)
 Agitation/mental status remains overall improved/stable. - Neurology consulted, recs appreciated - If any acute change in mental status or neuro neurological deficits, will need stat CT head - Neurosurgery consulted, appreciate recommendations  - Palliative care consulted, appreciate assistance in patient's care; next following up 9/19 - Messaged oncology + palliative regarding plans to follow-up with patient - Keppra  1,000 mg BID - Versed  prn for clinical seizures - IV Dexamethasone  4 mg Q8H (8am, 1pm, and 6pm along with other nightly medications at 6pm) - Will discuss with Neuro regarding switch to PO and timing of taper - Sugar regimen: Lantus  8u at bedtime and SSI while on steroids to maintain glucose 140-180 - Anticoagulated with heparin  - CT head to assess bleeding; consider transition to Eliquis  following (will run this by Neuro and family prior to switch) - Pain regimen: acetaminophen  1000 mg q6h, prn oxycodone  5 mg q4h - Consider addition of Toradol  every 6 hours if additional pain control needed, avoid sedating pain medications - Nausea regimen: Zofran  4 mg PRN - Continuous cardiac monitoring - Neuro checks Q4H - PT/OT following - SLP following - Fall and seizure precautions - AM Labs: CBC, BMP

## 2024-07-26 NOTE — Progress Notes (Addendum)
 Speech Language Pathology Treatment: Dysphagia  Patient Details Name: Wayne Molina MRN: [id number] DOB: September 19, 1956 Today's Date: 07/26/2024 Time: 8574-8558 SLP Time Calculation (min) (ACUTE ONLY): 16 min  Assessment / Plan / Recommendation Clinical Impression  Patient seen by SLP for skilled treatment focused on dysphagia. His brother, sister in law and his spouse were all in the room. His spouse reported that he continues to only eat a little bit of food. Reported that he is still becoming agitated and restless at night. Wife told SLP that patient had one leg out of bed with the other leg still in the bed in the middle of the night. Patient maintained adequate alertness for PO's consisting of small pieces of [employer] chicken and straw sips of water . Family is giving patient food items from his favorite restaurants to encourage PO intake. Frequent weak cough was observed following PO intake. This had not previously been observed by SLP in prior sessions. In response to this observation, SLP is recommending an MBS to evaluate the patient's swallow. Can continue current diet with careful feeding from family or staff. Further PO recommendations pending MBS results.   HPI HPI: Patient is a 68 y.o. man who presented to [facility] 8/28, with AMS, Code Stroke. PMHx significant for HTN, HLD, Afib on Eliquis , untreated OSA. Patient presented to ER as a Code Stroke after coworker noticed he was altered since 8/28 around 2230 and pale, patient reported feeling dizzy. In ER, noted to have L sided weakness and left hemianopsia. CT Head initially concerning for right posterior MCA stroke but CTA negative for LVO. Repeat CT Head concerning for mass. Course complicated by seizure episode. Intubated 8/29, extubated 9/4 but still with difficulty managing secreations. 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 Patient  was intubated for open craniotomy for biopsy (right head) on 09/08 and extubated in OR.      SLP Plan  Continue with current plan of care          Recommendations  Diet recommendations: Dysphagia 3 (mechanical soft);Thin liquid Liquids provided via: Cup;Straw Medication Administration: Other (Comment) Compensations: Minimize environmental distractions;Slow rate;Small sips/bites Postural Changes and/or Swallow Maneuvers: Seated upright 90 degrees                  Oral care BID   Frequent or constant Supervision/Assistance Cognitive communication deficit (R41.841);Dysphagia, unspecified (R13.10)     Continue with current plan of care   Damien Hy  Graduate SLP Clinican

## 2024-07-26 NOTE — Assessment & Plan Note (Signed)
 Voiding trial attempted, patient with urinary retention x3. Foley replaced 9/16. - Foley in place - Flomax  0.4 mg

## 2024-07-26 NOTE — Progress Notes (Signed)
 Daily Progress Note Intern Pager: [phone]  Patient name: Wayne Molina Medical record number: [id number] Date of birth: 03/06/1956 Age: 68 y.o. Gender: male  Primary Care Provider: Janna Ferrier, DO Consultants: Neurosurgery, neurology, oncology, palliative Code Status: DNR-Limited  Pt Overview and Major Events to Date:  8/29-admitted, focal seizure in ED, intubated, to the ICU 9/4-extubated 9/8-neurosurgery completed open craniotomy for tumor biopsy and confirmed glioblastoma 9/10-care resumed from ICU 9/15-started on heparin   Medical Decision Making:  Wayne Molina is a 68 y.o. male with PMH significant for A fib, OSA, osteoporosis, HTN, HLD admitted for seizure and found to have glioblastoma multiforme. Admission complicated by right distal DVT in setting of A fib, and following risks/benefits discussion with family, elected to proceed with anticoagulation with heparin . Consider switching this to Eliquis  pending results of CT head and with agreement from neurology; will also discuss with family prior to switch. 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 - Neurology consulted, recs appreciated - If any acute change in mental status or neuro neurological deficits, will need stat CT head - Neurosurgery consulted, appreciate recommendations  - Palliative care consulted, appreciate assistance in patient's care; next following up 9/19 - Messaged oncology + palliative regarding plans to follow-up with patient - Keppra  1,000 mg BID - Versed  prn for clinical seizures - IV Dexamethasone  4 mg Q8H (8am, 1pm, and 6pm along with other nightly medications at 6pm) - Will discuss with Neuro regarding switch to PO and timing of taper - Sugar regimen: Lantus  8u at bedtime and SSI while on steroids to maintain glucose 140-180 - Anticoagulated with heparin  - CT head to assess bleeding;  consider transition to Eliquis  following (will run this by Neuro and family prior to switch) - Pain regimen: acetaminophen  1000 mg q6h, prn oxycodone  5 mg q4h - Consider addition of Toradol  every 6 hours if additional pain control needed, avoid sedating pain medications - Nausea regimen: Zofran  4 mg PRN - Continuous cardiac monitoring - Neuro checks Q4H - PT/OT following - SLP following - Fall and seizure precautions - AM Labs: CBC, BMP Malnutrition of moderate degree Poor po intake; has smallbore feeding tube left nare. Continue to follow goals of care discussions as above with palliative. RD continues to follow. - Feeding supplement (Jevity 1.5 Cal/fiber) liquid at 45 mL/hr - Feeding supplement (Prosource TF 20) liquid 60 mL daily - Fiber supplement (Banatrol TF) liquid 60 mL BID - Mupirocin  ointment 2% twice daily - Protonix  40 mg daily - Appreciate nutrition, palliative recs - Considering reduced tube feeds to promote hunger/po feeds Urinary retention Voiding trial attempted, patient with urinary retention x3. Foley replaced 9/16. - Foley in place - Flomax  0.4 mg Sundowning Remains improved on current regimen. - Seroquel  25 mg at 6pm; additional Seroquel  12.5 mg at 8pm if patient not resting [per neurology] - Melatonin 3 mg at 6pm Atrial fibrillation with RVR (HCC) Rate controlled. Anticoagulated with heparin  following discussion with family on 9/15. - Coreg  12.5 mg twice daily - Anticoagulation with Heparin  Thrombosis/embolism, venous DVT US  on 9/14 suggestive of acute intramuscular thrombosis involving the right gastrocnemius veins. - Anticoagulation as above (heparin ) Wound of gluteal cleft As seen on photos from 9/14 AM.  Likely from prolonged immobility and moisture.  Satellite lesions suggestive of fungal etiology. - Clotrimazole  cream twice daily - Gerhardt's butt cream TID  - Desitin 40% ointment  - Turn patient every 2 hours HAP (hospital-acquired  pneumonia)  (Resolved: 07/26/2024) Remains afebrile. WBC 17>14 today; pt on steroids so some elevation expected. S/p Zosyn  (9/12-9/16). - Blood culture: No growth 5 days. Chronic health problem HTN - BP today 140s/60s. Continue home Coreg  12.5 mg BID. Holding home Losartan . Discontinued hydralazine  9/12. Systolic goal <160.  HLD - continue home Atorvastatin  40mg  daily  FEN/GI: Dys 3 PPx: Heparin  Dispo:Pending clinical improvement and continued palliative/oncology discussions.  Subjective:  No overnight events.  Today, patient reports he is overall doing all right.  Reports some throat pain, but denies any abdominal pain or pain in legs.  Wife is also present, and reports patient overall ate more yesterday.  He has not yet eaten anything today, but wife is hoping to convince him.  Also per wife, became somewhat agitated last night and was trying to get out of bed.  The bed alarm did not go off at this time.  Objective: Temp:  [97.7 F (36.5 C)-98 F (36.7 C)] 98 F (36.7 C) (09/17 0816) Pulse Rate:  [67-98] 67 (09/17 0816) Resp:  [16-19] 16 (09/17 0816) BP: (117-144)/(67-91) 117/85 (09/17 0816) SpO2:  [99 %-100 %] 100 % (09/17 0816) Weight:  [76.2 kg] 76.2 kg (09/17 0004) Physical Exam: General: Patient lying in bed with head of bed elevated, no acute distress. Cardiovascular: Irregularly irregular, no murmurs/rubs/gallops. Respiratory: Normal work of breathing on room air.  Some transmitted upper respiratory sounds.  Lungs themselves largely anteriorly clear to auscultation bilaterally. Abdomen: Bowel sounds present and normoactive bilaterally. Soft, nondistended, nontender. Extremities: No bilateral lower extremity edema.  No pain to palpation of bilateral calves.  Skin cool dry. Neuro: Alert and oriented to person, place, time (year). Appropriately responding to questions.  Laboratory: Most recent CBC Lab Results  Component Value Date   WBC 14.0 (H) 07/26/2024   HGB 15.9 07/26/2024   HCT  48.7 07/26/2024   MCV 93.8 07/26/2024   PLT 395 07/26/2024   Most recent BMP    Latest Ref Rng & Units 07/26/2024    8:00 AM  BMP  Glucose 70 - 99 mg/dL 843   BUN 8 - 23 mg/dL 38   Creatinine 9.38 - 1.24 mg/dL 9.07   Sodium 864 - 854 mmol/L 135   Potassium 3.5 - 5.1 mmol/L 5.0   Chloride 98 - 111 mmol/L 102   CO2 22 - 32 mmol/L 16   Calcium  8.9 - 10.3 mg/dL 8.7   Mag: 2.3   Larraine Palma, MD 07/26/2024, 10:45 AM  PGY-1, [facility] FPTS Intern pager: [phone], text pages welcome Secure chat group [facility] Teaching Service

## 2024-07-27 ENCOUNTER — Inpatient Hospital Stay (HOSPITAL_COMMUNITY)

## 2024-07-27 ENCOUNTER — Ambulatory Visit: Payer: Self-pay | Admitting: Cardiovascular Disease

## 2024-07-27 DIAGNOSIS — D497 Neoplasm of unspecified behavior of endocrine glands and other parts of nervous system: Secondary | ICD-10-CM | POA: Diagnosis not present

## 2024-07-27 DIAGNOSIS — R569 Unspecified convulsions: Secondary | ICD-10-CM | POA: Diagnosis not present

## 2024-07-27 DIAGNOSIS — L24A2 Irritant contact dermatitis due to fecal, urinary or dual incontinence: Secondary | ICD-10-CM

## 2024-07-27 DIAGNOSIS — C719 Malignant neoplasm of brain, unspecified: Secondary | ICD-10-CM | POA: Diagnosis not present

## 2024-07-27 LAB — CBC
HCT: 45.8 % (ref 39.0–52.0)
Hemoglobin: 15.5 g/dL (ref 13.0–17.0)
MCH: 30.4 pg (ref 26.0–34.0)
MCHC: 33.8 g/dL (ref 30.0–36.0)
MCV: 89.8 fL (ref 80.0–100.0)
Platelets: 650 K/uL — ABNORMAL HIGH (ref 150–400)
RBC: 5.1 MIL/uL (ref 4.22–5.81)
RDW: 12.3 % (ref 11.5–15.5)
WBC: 13 K/uL — ABNORMAL HIGH (ref 4.0–10.5)
nRBC: 0 % (ref 0.0–0.2)

## 2024-07-27 LAB — BASIC METABOLIC PANEL WITH GFR
Anion gap: 15 (ref 5–15)
BUN: 32 mg/dL — ABNORMAL HIGH (ref 8–23)
CO2: 22 mmol/L (ref 22–32)
Calcium: 8.9 mg/dL (ref 8.9–10.3)
Chloride: 98 mmol/L (ref 98–111)
Creatinine, Ser: 0.85 mg/dL (ref 0.61–1.24)
GFR, Estimated: >60 mL/min (ref >60)
Glucose, Bld: 164 mg/dL — ABNORMAL HIGH (ref 70–99)
Potassium: 4.7 mmol/L (ref 3.5–5.1)
Sodium: 135 mmol/L (ref 135–145)

## 2024-07-27 LAB — GLUCOSE, CAPILLARY
Glucose-Capillary: 193 mg/dL — ABNORMAL HIGH (ref 70–99)
Glucose-Capillary: 167 mg/dL — ABNORMAL HIGH (ref 70–99)
Glucose-Capillary: 163 mg/dL — ABNORMAL HIGH (ref 70–99)
Glucose-Capillary: 217 mg/dL — ABNORMAL HIGH (ref 70–99)
Glucose-Capillary: 122 mg/dL — ABNORMAL HIGH (ref 70–99)
Glucose-Capillary: 210 mg/dL — ABNORMAL HIGH (ref 70–99)

## 2024-07-27 LAB — HEPARIN LEVEL (UNFRACTIONATED): Heparin Unfractionated: 0.62 [IU]/mL (ref 0.30–0.70)

## 2024-07-27 MED ORDER — DEXAMETHASONE SODIUM PHOSPHATE 4 MG/ML IJ SOLN
2.0000 mg | INTRAMUSCULAR | Status: DC
  Administered 2024-07-27 – 2024-08-01 (×14): 2 mg via INTRAVENOUS
  Filled 2024-07-27 (×14): qty 1

## 2024-07-27 MED ORDER — INSULIN GLARGINE 100 UNIT/ML ~~LOC~~ SOLN
12.0000 [IU] | Freq: Every day | SUBCUTANEOUS | Status: DC
  Administered 2024-07-27 – 2024-07-28 (×2): 12 [IU] via SUBCUTANEOUS
  Filled 2024-07-27 (×6): qty 0.12

## 2024-07-27 MED ORDER — KATE FARMS STANDARD 1.4 EN LIQD
400.0000 mL | Freq: Every day | ENTERAL | Status: DC
  Administered 2024-07-27: 400 mL via ORAL
  Filled 2024-07-27 (×2): qty 400

## 2024-07-27 MED ORDER — KATE FARMS STANDARD 1.4 EN LIQD: 400.0000 mL | Freq: Every day | ENTERAL | Status: DC

## 2024-07-27 MED ORDER — DEXAMETHASONE SODIUM PHOSPHATE 4 MG/ML IJ SOLN: 2.0000 mg | INTRAMUSCULAR | Status: DC

## 2024-07-27 MED ORDER — KATE FARMS STANDARD 1.4 EN LIQD: 1000.0000 mL | Freq: Every day | ENTERAL | Status: DC

## 2024-07-27 MED ORDER — APIXABAN 5 MG PO TABS
5.0000 mg | ORAL_TABLET | Freq: Two times a day (BID) | ORAL | Status: DC
  Administered 2024-07-27 – 2024-08-04 (×17): 5 mg via ORAL
  Filled 2024-07-27 (×17): qty 1

## 2024-07-27 MED ORDER — BANATROL TF EN LIQD
60.0000 mL | Freq: Every day | ENTERAL | Status: DC
  Administered 2024-07-28: 60 mL
  Filled 2024-07-27: qty 60

## 2024-07-27 MED ORDER — LOPERAMIDE HCL 2 MG PO CAPS
4.0000 mg | ORAL_CAPSULE | ORAL | Status: AC | PRN
  Administered 2024-07-27 – 2024-07-28 (×3): 4 mg via ORAL
  Filled 2024-07-27 (×4): qty 2

## 2024-07-27 NOTE — Assessment & Plan Note (Addendum)
 DVT US  on 9/14 suggestive of acute intramuscular thrombosis involving the right gastrocnemius veins. - Anticoagulation as above

## 2024-07-27 NOTE — Assessment & Plan Note (Addendum)
 HTN - BP today in the 150s/90s. Continue home Coreg  12.5 mg BID. Holding home Losartan . Discontinued hydralazine  9/12. Systolic goal <160.  HLD - Continue home Atorvastatin  40mg  daily

## 2024-07-27 NOTE — Plan of Care (Signed)
  Problem: Education: Goal: Knowledge of General Education information will improve Description: Including pain rating scale, medication(s)/side effects and non-pharmacologic comfort measures Outcome: Progressing   Problem: Health Behavior/Discharge Planning: Goal: Ability to manage health-related needs will improve Outcome: Progressing   Problem: Clinical Measurements: Goal: Ability to maintain clinical measurements within normal limits will improve Outcome: Progressing Goal: Will remain free from infection Outcome: Progressing Goal: Diagnostic test results will improve Outcome: Progressing Goal: Respiratory complications will improve Outcome: Progressing   Problem: Activity: Goal: Risk for activity intolerance will decrease Outcome: Progressing   Problem: Nutrition: Goal: Adequate nutrition will be maintained Outcome: Progressing   Problem: Coping: Goal: Level of anxiety will decrease Outcome: Progressing   Problem: Elimination: Goal: Will not experience complications related to bowel motility Outcome: Progressing Goal: Will not experience complications related to urinary retention Outcome: Progressing   Problem: Pain Managment: Goal: General experience of comfort will improve and/or be controlled Outcome: Progressing   Problem: Safety: Goal: Ability to remain free from injury will improve Outcome: Progressing   Problem: Skin Integrity: Goal: Risk for impaired skin integrity will decrease Outcome: Progressing   Problem: Education: Goal: Ability to describe self-care measures that may prevent or decrease complications (Diabetes Survival Skills Education) will improve Outcome: Progressing Goal: Individualized Educational Video(s) Outcome: Progressing   Problem: Coping: Goal: Ability to adjust to condition or change in health will improve Outcome: Progressing   Problem: Fluid Volume: Goal: Ability to maintain a balanced intake and output will  improve Outcome: Progressing   Problem: Health Behavior/Discharge Planning: Goal: Ability to identify and utilize available resources and services will improve Outcome: Progressing Goal: Ability to manage health-related needs will improve Outcome: Progressing   Problem: Metabolic: Goal: Ability to maintain appropriate glucose levels will improve Outcome: Progressing   Problem: Nutritional: Goal: Maintenance of adequate nutrition will improve Outcome: Progressing Goal: Progress toward achieving an optimal weight will improve Outcome: Progressing   Problem: Skin Integrity: Goal: Risk for impaired skin integrity will decrease Outcome: Progressing   Problem: Tissue Perfusion: Goal: Adequacy of tissue perfusion will improve Outcome: Progressing   Problem: Education: Goal: Knowledge of the prescribed therapeutic regimen will improve Outcome: Progressing   Problem: Clinical Measurements: Goal: Usual level of consciousness will be regained or maintained. Outcome: Progressing Goal: Neurologic status will improve Outcome: Progressing Goal: Ability to maintain intracranial pressure will improve Outcome: Progressing   Problem: Skin Integrity: Goal: Demonstration of wound healing without infection will improve Outcome: Progressing

## 2024-07-27 NOTE — Progress Notes (Addendum)
 Speech Language Pathology Treatment: Dysphagia  Wayne Molina Details Name: Wayne Molina MRN: [id number] DOB: 1956/06/15 Today's Date: 07/27/2024 Time: 9260-9183 SLP Time Calculation (min) (ACUTE ONLY): 37 min  Assessment / Plan / Recommendation Clinical Impression  Wayne Molina seen for skilled SLP to determine readiness/indication for instrumental swallow evaluation- MBS. RN, Zebedee, reports today Wayne Molina with much improved secretion management, odynophagia ongoing. Wayne Molina awake with wife at bedside eating chicken and drinking Ensure and water . He demonstrates adequate mastication and eats slowly with adequate oral clearance. Subtle throat clear noted with approx 25% of water  boluses and cough x2 noted at end of chicken snack *consumed 1/2 cup chicken. Wayne Molina benefited from mod verbal cue to expectorate chicken and increase effort of cough and expectoration. Wet voice x1 cleared with cued throat clear. Wayne Molina endorses h/o sensing food sticking in distal esophagus- clearing effectively with liquid swallows per his report. He denies any overt symptoms of GERD but admits to vocal hoarseness in the mornings and describe his voice today as raspy and low.   No overt symptoms of odynophagia noted but Wayne Molina reports ongoing issue.  Discussed options with Wayne Molina/family - to proceed with MBS or hold and have Wayne Molina continue po with strict precautions with close monitoring given improved swallow clinically observed.  Both Wayne Molina and wife wish to hold on MBS at this time. Wayne Molina is making good progress.   Wayne Molina's voice continues to be hoarse with low phonation intensity and he has been extubated after intubated 8/29-9/5 and then 9/8 for surgery - ? Source? - Neuro vs intubation vs ?.     Using teach back and written precautions, both Wayne Molina and wife educated.    HPI HPI: Wayne Molina is a 68 y.o. man who [facility], with AMS, Code Stroke. PMHx significant for HTN, HLD, Afib on Eliquis , untreated OSA. Wayne Molina presented to ER as a Code Stroke after coworker  noticed he was altered since 8/28 around 2230 and pale, Wayne Molina reported feeling dizzy. In ER, noted to have L sided weakness and left hemianopsia. CT Head initially concerning for right posterior MCA stroke but CTA negative for LVO. Repeat CT Head concerning for mass. Course complicated by seizure episode. Intubated 8/29, extubated 9/5 but was still with difficulty managing secreations. Per RN 9/18, Wayne Molina with significant improvement.  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 Wayne Molina was intubated for open craniotomy for biopsy (right head) on 09/08. He has been maintained on cortrak tube feeding and some po intake.      SLP Plan  Continue with current plan of care          Recommendations  Diet recommendations: Dysphagia 3 (mechanical soft);Thin liquid Liquids provided via: Cup;Straw Medication Administration: Other (Comment) Compensations: Minimize environmental distractions;Slow rate;Small sips/bites Postural Changes and/or Swallow Maneuvers: Seated upright 90 degrees;Upright 30-60 min after meal                  Oral care BID   Frequent or constant Supervision/Assistance Cognitive communication deficit (R41.841);Dysphagia, unspecified (R13.10)     Continue with current plan of care    Wayne POUR, MS [facility] SLP Acute Rehab Services Office [phone]  Wayne Molina  07/27/2024, 8:38 AM

## 2024-07-27 NOTE — Assessment & Plan Note (Signed)
-   Seroquel  25 mg at 6pm; additional Seroquel  12.5 mg at 8pm if patient not resting [per neurology] - Melatonin 3 mg at 6pm

## 2024-07-27 NOTE — Assessment & Plan Note (Signed)
 As seen on photos from 9/14 AM.  Likely from prolonged immobility and moisture.  Satellite lesions suggestive of fungal etiology. - Clotrimazole  cream twice daily - Gerhardt's butt cream TID  - Desitin 40% ointment  - Turn patient every 2 hours

## 2024-07-27 NOTE — Progress Notes (Signed)
 Brief Nutrition Support Note  Per RN, pt tolerating new TF formula and no diarrhea recorded today. RN reports pt had a banana for breakfast, bojangles for lunch, and ate most of her meal tray for dinner. Will adjust TF to cyclic over 16 hours to decrease total volume and allow a few hours off of pump to determine if it helps with appetite. Will start kcal count to monitor intake.    Continue tube feeding via Cortrak. Change to the following: Wayne Molina 1.4 @ 63 mL/hr x 16 hours (1000 mL/day) PROSource TF20 60 mL 1x/d 60mL free water  q4 hours Tube feeding regimen provides 1480 kcal, 82 grams of protein, and 720 ml of H2O. ( free water  TF+flush)  Wayne Molina, RD, LDN, CNSC Registered Dietitian II Please reach out via secure chat

## 2024-07-27 NOTE — Progress Notes (Signed)
 Occupational Therapy Treatment Patient Details Name: Wayne Molina MRN: [id number] DOB: [birth year] Today's Date: 07/27/2024   History of present illness Pt is a 68 y/o male presenting on 8/28 with L arm weakness, L hemianopsia, and slurred speech. 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 Course complicated by seizure episode. Intubated 8/29, extubated 9/4 but still with difficulty managing secretions.  Biopsy on 9/8. +DVT R LE 9/14.  PMH includes: anxiety, HLD, HTN, sleep apnea, chronic afib.   OT comments  Patient demonstrating good gains with OT treatment with bed mobility, transfers, and grooming at sink. Patient is motivated towards therapy and gains and spouse is supportive.  Patient will benefit from intensive inpatient follow-up therapy, >3 hours/day. Acute OT to continue to follow to address established goals to facilitate DC to next venue of care.        If plan is discharge home, recommend the following:  Two people to help with walking and/or transfers;A lot of help with bathing/dressing/bathroom;Assistance with cooking/housework;Direct supervision/assist for medications management;Direct supervision/assist for financial management;Assist for transportation;Help with stairs or ramp for entrance   Equipment Recommendations  Other (comment) (defer)    Recommendations for Other Services      Precautions / Restrictions Precautions Precautions: Fall;Other (comment) Recall of Precautions/Restrictions: Impaired Precaution/Restrictions Comments: cortrak, watch vitals, SBP <160 Restrictions Weight Bearing Restrictions Per Provider Order: No       Mobility Bed Mobility Overal bed mobility: Needs Assistance Bed Mobility: Rolling, Sidelying to Sit Rolling: Min assist Sidelying to sit: Mod assist, HOB elevated, Used rails       General bed mobility comments: increased time and instructions on  rail use required mod assist to completely move LEs off bed and tor aise trunk    Transfers Overall transfer level: Needs assistance Equipment used: Rolling walker (2 wheels) Transfers: Sit to/from Stand, Bed to chair/wheelchair/BSC Sit to Stand: Max assist     Step pivot transfers: Max assist     General transfer comment: max assist to power up and for balance during transfer     Balance Overall balance assessment: Needs assistance Sitting-balance support: Feet supported, Bilateral upper extremity supported, Single extremity supported Sitting balance-Leahy Scale: Poor Sitting balance - Comments: min to mod assist due to left lateral leaning Postural control: Left lateral lean Standing balance support: Bilateral upper extremity supported, Reliant on assistive device for balance, During functional activity Standing balance-Leahy Scale: Poor Standing balance comment: reliant on RW                           ADL either performed or assessed with clinical judgement   ADL Overall ADL's : Needs assistance/impaired Eating/Feeding: Supervision/ safety;Sitting   Grooming: Wash/dry hands;Wash/dry face;Oral care;Brushing hair;Minimal assistance;Sitting               Lower Body Dressing: Maximal assistance Lower Body Dressing Details (indicate cue type and reason): to donn socks                    Extremity/Trunk Assessment              Vision       Perception     Praxis     Communication Communication Communication: Impaired Factors Affecting Communication: Reduced clarity of speech (soft voice)   Cognition Arousal: Alert Behavior During Therapy: Flat affect, Impulsive Cognition: Cognition impaired  Awareness: Online awareness impaired Memory impairment (select all impairments): Working [employer] fast streamer, Short-term memory Attention impairment (select first level of impairment): Sustained attention Executive functioning impairment (select all  impairments): Problem solving, Reasoning, Sequencing, Organization                   Following commands: Impaired Following commands impaired: Follows one step commands with increased time, Follows multi-step commands inconsistently, Follows one step commands inconsistently      Cueing   Cueing Techniques: Verbal cues, Tactile cues, Visual cues  Exercises      Shoulder Instructions       General Comments      Pertinent Vitals/ Pain       Pain Assessment Pain Assessment: Faces Faces Pain Scale: No hurt Pain Intervention(s): Monitored during session  Home Living                                          Prior Functioning/Environment              Frequency  Min 2X/week        Progress Toward Goals  OT Goals(current goals can now be found in the care plan section)  Progress towards OT goals: Progressing toward goals  Acute Rehab OT Goals Patient Stated Goal: none stated OT Goal Formulation: With patient Time For Goal Achievement: 07/30/24 Potential to Achieve Goals: Good ADL Goals Pt Will Perform Grooming: with min assist;sitting Pt Will Transfer to Toilet: with mod assist;squat pivot transfer;stand pivot transfer;bedside commode Pt/caregiver will Perform Home Exercise Program: Increased strength;Increased ROM;Left upper extremity;With minimal assist Additional ADL Goal #1: pt will visually track and locate items on L side with min cues in order to promote ind with ADLS Additional ADL Goal #2: pt will perform bed mobility min A in prep for seated ADLs  Plan      Co-evaluation                 AM-PAC OT 6 Clicks Daily Activity     Outcome Measure   Help from another person eating meals?: A Lot Help from another person taking care of personal grooming?: A Lot Help from another person toileting, which includes using toliet, bedpan, or urinal?: Total Help from another person bathing (including washing, rinsing, drying)?: A  Lot Help from another person to put on and taking off regular upper body clothing?: A Lot Help from another person to put on and taking off regular lower body clothing?: A Lot 6 Click Score: 11    End of Session Equipment Utilized During Treatment: Gait belt;Rolling walker (2 wheels)  OT Visit Diagnosis: Unsteadiness on feet (R26.81);Other abnormalities of gait and mobility (R26.89);Muscle weakness (generalized) (M62.81)   Activity Tolerance Patient tolerated treatment well   Patient Left in chair;with call bell/phone within reach;with chair alarm set;with family/visitor present   Nurse Communication Mobility status;Need for lift equipment        Time: [phone] OT Time Calculation (min): 30 min  Charges: OT General Charges $OT Visit: 1 Visit OT Treatments $Self Care/Home Management : 23-37 mins  Dick Laine, OTA Acute Rehabilitation Services  Office [phone]   [name] 07/27/2024, 1:10 PM

## 2024-07-27 NOTE — Assessment & Plan Note (Addendum)
-   Neurology consulted, recs appreciated - If any acute change in mental status or neuro neurological deficits, will need stat CT head - Neurosurgery consulted, appreciate recommendations  - Palliative care consulted, appreciate assistance in patient's care; next following up 9/19 - Oncology consulted, appreciate recommendations; asked to follow-up with palliative - Keppra  1000 mg BID - Versed  prn for clinical seizures - IV Dexamethasone  2 mg Q8H (8am, 1pm, and 6pm along with other nightly medications at 6pm) - Taper per neurology: reduce to 2 mg q12h after 2 weeks (on 10/1), then after 2 weeks reduce to 2mg  once daily (on 10/15) - Sugar regimen: Lantus  8u at bedtime and SSI while on steroids to maintain glucose 140-180  - Given elevated nighttime levels, increase lantus  to 12u at bedtime - Switching anticoagulation from heparin  to Eliquis  5 mg BID - Pain regimen: acetaminophen  1000 mg q6h, prn oxycodone  5 mg q4h - Consider addition of Toradol  every 6 hours if additional pain control needed, avoid sedating pain medications - Nausea regimen: Zofran  4 mg PRN - Continuous cardiac monitoring - Neuro checks Q4H - PT/OT following - SLP following - Fall and seizure precautions - AM Labs: BMP, CBC

## 2024-07-27 NOTE — Progress Notes (Addendum)
 Subjective: No acute events overnight.  No new concerns.  States he had some [place] and gravy last night.  Brother at bedside today helping him with breakfast.  Reports burning in throat when he eats.  Has a spray to help with the pain and discomfort   ROS: negative except above  Examination  Vital signs in last 24 hours: Temp:  [97.5 F (36.4 C)-98.5 F (36.9 C)] 97.6 F (36.4 C) (09/18 0818) Pulse Rate:  [88-102] 91 (09/18 0818) Resp:  [16-18] 16 (09/18 0818) BP: (141-163)/(82-98) 144/95 (09/18 0818) SpO2:  [98 %-100 %] 98 % (09/18 0818)  General: Sitting in chair, not in apparent distress Neuro:Awake, alert, oriented to person and place, follows commands, antigravity strength in all 4 extremities   Basic Metabolic Panel: Recent Labs  Lab 07/23/24 0633 07/24/24 0843 07/25/24 0513 07/26/24 0800 07/27/24 0443  NA 135 135 134* 135 135  K 4.3 4.9 5.0 5.0 4.7  CL 99 101 103 102 98  CO2 25 23 20* 16* 22  GLUCOSE 158* 187* 173* 156* 164*  BUN 27* 29* 34* 38* 32*  CREATININE 0.79 0.76 0.85 0.92 0.85  CALCIUM  8.7* 8.7* 8.7* 8.7* 8.9  MG  --   --   --  2.3  --     CBC: Recent Labs  Lab 07/21/24 1043 07/22/24 0211 07/23/24 0633 07/24/24 0616 07/25/24 0513 07/26/24 0800 07/27/24 0443  WBC 49.4*   < > 24.5* 15.6* 17.0* 14.0* 13.0*  NEUTROABS 47.9*  --   --   --   --   --   --   HGB 13.5   < > 14.2 15.6 16.5 15.9 15.5  HCT 39.8   < > 42.0 47.5 49.5 48.7 45.8  MCV 91.1   < > 90.7 94.8 91.8 93.8 89.8  PLT 372   < > 525* 406* 529* 395 650*   < > = values in this interval not displayed.     Coagulation Studies: No results for input(s): LABPROT, INR in the last 72 hours.  Imaging personally reviewed CT head without contrast 07/26/2024: Similar appearance of right parietal craniotomy for biopsy of mass lesion. There are decreased hyperdense components within the mass compatible with decreased post-biopsy blood products. Similar vasogenic edema and mass  effect.  ASSESSMENT AND PLAN:68 y.o. 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  After 2 weeks, can reduce to 2 mg every 12 hours and after another 2 weeks reduce to 2 mg once daily.  -Plan to switch heparin  to Eliquis  hopefully later today -Continue to encourage p.o. diet and minimize tube feeds -As needed Versed  for clinical seizures -Discussed plan with brother at bedside and medicine team via secure chat       I personally spent a total of 35 minutes in the care of the patient today including getting/reviewing separately obtained history, performing a medically appropriate exam/evaluation, counseling and educating, placing orders, referring and communicating with other health care professionals, documenting clinical information in the EHR, independently interpreting results, and coordinating care.        [facility] For questions after 5pm please refer to AMION to reach the Neurologist on call

## 2024-07-27 NOTE — Assessment & Plan Note (Signed)
-   Foley replaced 9/16 - Flomax  0.4 mg

## 2024-07-27 NOTE — Progress Notes (Addendum)
 Daily Progress Note Intern Pager: [phone]  Patient name: Wayne Molina Medical record number: [id number] Date of birth: [birth year] Age: 68 y.o. Gender: male  Primary Care Provider: Janna Ferrier, DO Consultants: Neurosurgery, neurology, oncology, palliative  Code Status: DNR-Limited  Pt Overview and Major Events to Date:  8/29-admitted, focal seizure in ED, intubated, to the ICU 9/4-extubated 9/8-neurosurgery completed open craniotomy for tumor biopsy and confirmed glioblastoma 9/10-care resumed from ICU 9/15-started on heparin  9/18-switched to Eliquis   Medical Decision Making:  Wayne Molina is a 68 y.o. male with PMH significant for A fib, OSA, osteoporosis, HTN, HLD  admitted with seizure found to be 2/2 glioblastoma multiforme.  Admission complicated by right distal DVT, family and patient electing for anticoagulation, currently with heparin . Discussed transition to Eliquis  yesterday following head CT with decreased post-biopsy bleed; patient's daughter has a few additional questions prior to switch (pt's wife and son are agreeable to switch if daughter also agreeable), answered and now switching to Eliquis .  Planning for care team meeting with family on Tues 9/23 morning. 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 - Neurology consulted, recs appreciated - If any acute change in mental status or neuro neurological deficits, will need stat CT head - Neurosurgery consulted, appreciate recommendations  - Palliative care consulted, appreciate assistance in patient's care; next following up 9/19 - Oncology consulted, appreciate recommendations; asked to follow-up with palliative - Keppra  1000 mg BID - Versed  prn for clinical seizures - IV Dexamethasone  2 mg Q8H (8am, 1pm, and 6pm along with other nightly medications at 6pm) - Taper per neurology: reduce to 2 mg q12h after 2 weeks (on 10/1), then after 2 weeks reduce to 2mg  once  daily (on 10/15) - Sugar regimen: Lantus  8u at bedtime and SSI while on steroids to maintain glucose 140-180  - Given elevated nighttime levels, increase lantus  to 12u at bedtime - Switching anticoagulation from heparin  to Eliquis  5 mg BID - Pain regimen: acetaminophen  1000 mg q6h, prn oxycodone  5 mg q4h - Consider addition of Toradol  every 6 hours if additional pain control needed, avoid sedating pain medications - Nausea regimen: Zofran  4 mg PRN - Continuous cardiac monitoring - Neuro checks Q4H - PT/OT following - SLP following - Fall and seizure precautions - AM Labs: BMP, CBC Malnutrition of moderate degree Recently with improved po intake. Still with smallbore feeding tube (Cortrak) left nare. Continue to follow goals of care discussions with palliative. RD continues to follow. - Feeding supplement (Jevity 1.5 Cal/fiber) liquid at 45 mL/hr - Feeding supplement (Prosource TF 20) liquid 60 mL daily - Fiber supplement (Banatrol TF) liquid 60 mL BID - Mupirocin  ointment 2% twice daily - Protonix  40 mg daily - Appreciate nutrition, palliative recs Urinary retention - Foley replaced 9/16 - Flomax  0.4 mg Sundowning - Seroquel  25 mg at 6pm; additional Seroquel  12.5 mg at 8pm if patient not resting [per neurology] - Melatonin 3 mg at 6pm Atrial fibrillation with RVR (HCC) Rate controlled. Initially anticoagulated with heparin  following discussion with family on 9/15, now switching to Eliquis  as above. - Rate control: Coreg  12.5 mg twice daily - Anticoagulation: Eliquis  5 mg BID Thrombosis/embolism, venous DVT US  on 9/14 suggestive of acute intramuscular thrombosis involving the right gastrocnemius veins. - Anticoagulation as above Wound of gluteal cleft As seen on photos from 9/14 AM.  Likely from prolonged immobility and moisture.  Satellite lesions suggestive of fungal etiology. - Clotrimazole  cream twice daily - Gerhardt's butt  cream TID  - Desitin 40% ointment  - Turn patient  every 2 hours Chronic health problem HTN - BP today in the 150s/90s. Continue home Coreg  12.5 mg BID. Holding home Losartan . Discontinued hydralazine  9/12. Systolic goal <160.  HLD - Continue home Atorvastatin  40mg  daily  FEN/GI: Dys 3 PPx: Eliquis  5 mg BID Dispo: Pending clinical improvement and improved po intake and continued palliative/oncology discussions.  Subjective:  Patient is eating breakfast this morning.  He reports some sore throat but otherwise is doing well.  He does report abdominal pain with a bowel movement, none at rest.  Per patient's wife, no diarrhea episodes since 7 PM last night.  He was not as agitated last night per wife. Patient's wife is present throughout discussion.  Addendum: Spoke with daughter at bedside ~11:45am to discuss switch from heparin  to Eliquis . Questions were answered and she is agreeable to this change at this time; rest of family already provided agreement for this change. Also updated family about plan for care team meeting on Tuesday 9/23 morning.  Objective: Temp:  [97.5 F (36.4 C)-98.5 F (36.9 C)] 97.8 F (36.6 C) (09/18 1138) Pulse Rate:  [88-102] 92 (09/18 1138) Resp:  [16-18] 16 (09/18 1138) BP: (139-163)/(82-98) 139/97 (09/18 1138) SpO2:  [98 %-100 %] 100 % (09/18 1138) Physical Exam: General: Sitting up in bed, finishing eating breakfast.  No acute distress. Cardiovascular: Irregularly irregular, no murmurs/rubs/gallops. Respiratory: Normal work of breathing on room air. Clear to auscultation bilaterally; no wheezes, crackles. Nasal feeding tube present Abdomen: Bowel sounds present and normoactive bilaterally. Soft, nondistended, nontender. Extremities: Skin cool, dry. No bilateral lower extremity edema.  No pain to palpation of bilateral calfs. Buttocks: Red and raw area perianally. No satellite lesions seen down onto thighs or scrotum. Neuro: Alert and appropriately responding to questions.  Laboratory: Most recent CBC Lab  Results  Component Value Date   WBC 13.0 (H) 07/27/2024   HGB 15.5 07/27/2024   HCT 45.8 07/27/2024   MCV 89.8 07/27/2024   PLT 650 (H) 07/27/2024   Most recent BMP    Latest Ref Rng & Units 07/27/2024    4:43 AM  BMP  Glucose 70 - 99 mg/dL 835   BUN 8 - 23 mg/dL 32   Creatinine 9.38 - 1.24 mg/dL 9.14   Sodium 864 - 854 mmol/L 135   Potassium 3.5 - 5.1 mmol/L 4.7   Chloride 98 - 111 mmol/L 98   CO2 22 - 32 mmol/L 22   Calcium  8.9 - 10.3 mg/dL 8.9     0/82 CT Head: Evidence of decreased post-biopsy blood products. Similar vasogenic edema and mass effect   Larraine Palma, MD 07/27/2024, 12:03 PM  PGY-1, [facility] FPTS Intern pager: [phone], text pages welcome Secure chat group [facility] Teaching Service

## 2024-07-27 NOTE — Progress Notes (Signed)
 Wayne Molina   DOB:Sep 24, 1956   FM#:[id number]      ASSESSMENT & PLAN:  Wayne Molina 68 y.o. male patient with newly diagnosed brain mass.  Path significant for GBM/glioblastoma multiforme.  He was admitted 9 8 and is status post brain biopsy.  NeuroOnc/Dr. [name] following.   Brain mass, Glioblastoma multiforme (GBM) - Status post right stereotactic brain biopsy 9/8.   -- Path shows high-grade glioma, WHO grade 4.   -- No treatments to start until 2-3 weeks recovery from surgery.  Oncologic therapy will be dependent upon patient's functional status.   -- Patient's tumor is in non-dominant hemisphere, mostly temporal lobe, not all that large, and he should be able to be treated if functional status improves.   -- Pending tumor genetics and IDH to finalize treatment planning, which will likely include RT + oral chemo.  Plan is to have patient see Dr. Vaslow/Neuro-Oncology as outpatient as his functional status will drive treatment options.   -- Recommend continue addressing ongoing issues with nutrition and other clinical concerns before starting oncology therapy. -- Will likely need Rad Onc eval - Neuro oncology/Dr. [name] following  and will make additional treatment recommendations.   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  Recent CT head supportive of this.  -- If patient continues to improve, he will be offered oncologic treatment. However if no significant improvement, he will not be offered any treatments.  -- Continue supportive care.  Leukocytosis -- WBC decreasing, 13.0 today  -- Elevated WBC may be related to steroids or infectious process   -- Continue IV antibiotics as ordered -- Monitor fever curve, afebrile at this time  Thrombocytosis   -- Elevated platelets  --  No intervention at this time.   -- Continue to monitor CBC with differential  Afib -- On Eliquis  -- Monitor for bleeding   Hypertension Hyperlipidemia  -- Continue to monitor BP closely    Code Status DNR-Limited  Subjective:  Patient seen awake and alert sitting up in chair at bedside.  Two family members at bedside.  Indeed appears better than when previously seen on 9/12.  Admits to slight pain however states he's okay.  Speech is still very soft and difficult to hear, able to articulate softly that he's in the hospital.  NGT with feeding is ongoing.  Patient stretched out his hand for a handshake and said thank you.  No other acute complaints offered or acute distress noted.   Objective:   Intake/Output Summary (Last 24 hours) at 07/27/2024 1334 Last data filed at 07/27/2024 1232 Gross per 24 hour  Intake 2441.26 ml  Output 2750 ml  Net -308.74 ml     PHYSICAL EXAMINATION: ECOG PERFORMANCE STATUS: 3 - Symptomatic, >50% confined to bed  Vitals:   07/27/24 0818 07/27/24 1138  BP: (!) 144/95 (!) 139/97  Pulse: 91 92  Resp: 16 16  Temp: 97.6 F (36.4 C) 97.8 F (36.6 C)  SpO2: 98% 100%   Filed Weights   07/22/24 0500 07/23/24 0500 07/26/24 0004  Weight: 160 lb 4.4 oz (72.7 kg) 162 lb 7.7 oz (73.7 kg) 168 lb (76.2 kg)    GENERAL: alert, no distress  and comfortable +ill-appearing however improvement noted SKIN: skin color, texture, turgor are normal, no rashes or significant lesions EYES: normal, conjunctiva are pink and non-injected, sclera clear OROPHARYNX: no exudate, no erythema and lips, buccal mucosa, and tongue normal  NECK: supple, thyroid  normal size, non-tender, without nodularity LYMPH: no palpable lymphadenopathy in the cervical, axillary or inguinal LUNGS: clear to auscultation and percussion with normal breathing  effort HEART: regular rate & rhythm and no murmurs and no lower extremity edema ABDOMEN: abdomen soft, non-tender and normal bowel sounds MUSCULOSKELETAL: no cyanosis of digits and no clubbing  PSYCH: alert & oriented  NEURO: no focal motor/sensory deficits   All questions were answered. The patient knows to call the clinic with any problems, questions or concerns.   The total time spent in the appointment was 40 minutes encounter with patient including review of chart and various tests results, discussions about plan of care and coordination of care plan  Olam JINNY Brunner, NP 07/27/2024 1:34 PM    Labs Reviewed:  Lab Results  Component Value Date   WBC 13.0 (H) 07/27/2024   HGB 15.5 07/27/2024   HCT 45.8 07/27/2024   MCV 89.8 07/27/2024   PLT 650 (H) 07/27/2024   Recent Labs    07/10/24 0503 07/10/24 1700 07/11/24 0508 07/12/24 0337 07/24/24 0843 07/25/24 0513 07/26/24 0800 07/27/24 0443  NA 138   < > 137   < > 135 134* 135 135  K 3.8   < > 4.0   < > 4.9 5.0 5.0 4.7  CL 107  --  105   < > 101 103 102 98  CO2 22  --  23   < > 23 20* 16* 22  GLUCOSE 115*  --  145*   < > 187* 173* 156* 164*  BUN 20  --  18   < > 29* 34* 38* 32*  CREATININE 0.84  --  0.78   < > 0.76 0.85 0.92 0.85  CALCIUM  8.1*  --  8.1*   < > 8.7* 8.7* 8.7* 8.9  GFRNONAA >60  --  >60   < > >60 >60 >60 >60  PROT 5.8*  --  6.0*  --  6.5  --   --   --   ALBUMIN 2.7*  --  2.7*  --  2.4*  --   --   --   AST 32  --  29  --  40  --   --   --   ALT 27  --  27  --  88*  --   --   --   ALKPHOS 43  --  64  --  96  --   --   --   BILITOT 1.3*  --  1.2  --  0.9  --   --   --    < > = values in this interval not displayed.    Studies Reviewed:  CT HEAD WO CONTRAST ( ) Result Date: 07/26/2024 EXAM: CT HEAD WITHOUT CONTRAST 07/26/2024 01:39:00 PM TECHNIQUE: CT of the head was performed without the administration of intravenous contrast. Automated exposure control, iterative reconstruction, and/or weight based  adjustment of the mA/kV was utilized to reduce the radiation dose to as low as reasonably achievable. COMPARISON: CT head 07/23/2024. CLINICAL HISTORY: Follow up imaging post brain biopsy tract hemorrhage. FINDINGS: BRAIN AND VENTRICLES: Similar appearance of right parietal craniotomy for biopsy of mass lesion. There are decreased hyperdense components of the mass compared to prior,  likely reflecting decreased postbiopsy blood products. Similar vasogenic edema in the right parietal lobe. Additional vasogenic edema in the posterior right temporal lobe also similar to prior. 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 Similar mass effect on the right lateral ventricle. No acute hemorrhage. No evidence of acute infarct. No hydrocephalus. No extra-axial collection. No midline shift. ORBITS: No acute abnormality. SINUSES: No acute abnormality. SOFT TISSUES AND SKULL: No acute soft tissue abnormality. No skull fracture. IMPRESSION: 1. Similar appearance of right parietal craniotomy for biopsy of mass lesion. There are decreased hyperdense components within the mass compatible with decreased post-biopsy blood products. 2. Similar vasogenic edema and mass effect. Electronically signed by: Donnice Mania MD 07/26/2024 04:12 PM EDT RP Workstation: [id number]   CUP PACEART REMOTE DEVICE CHECK Result Date: 07/25/2024 ILR summary report received. Battery status OK. Normal device function. No new symptom, tachy, brady, or pause episodes. No new AF episodes. Presenting EGM with irregular R-R intervals and indiscernible P waves c/w hx of permanent  atrial arrhythmia, V rates are controlled, on Eliquis  per Epic. Monthly summary reports and ROV/PRN. MC, CVRS  VAS US  LOWER EXTREMITY VENOUS (DVT) Result Date: 07/24/2024  Lower Venous DVT Study Patient Name:  KARVER FADDEN  Date of Exam:   07/23/2024 Medical Rec #: [id number]      Accession #:    [id number] Date of Birth: 1956-06-26       Patient Gender: M Patient Age:   30 years  Exam Location:  [facility] Procedure:      VAS US  LOWER EXTREMITY VENOUS (DVT) Referring Phys: CARINA BROWN --------------------------------------------------------------------------------  Indications: Pain.  Risk Factors: Recent cancer diagnosis (GDM). Comparison Study: Previous exam (LLEV) on 08/15/2020 was negative for DVT Performing Technologist: Ezzie Potters RVT, RDMS  Examination Guidelines: A complete evaluation includes B-mode imaging, spectral Doppler, color Doppler, and power Doppler as needed of all accessible portions of each vessel. Bilateral testing is considered an integral part of a complete examination. Limited examinations for reoccurring indications may be performed as noted. The reflux portion of the exam is performed with the patient in reverse Trendelenburg.  +---------+---------------+---------+-----------+----------+--------------+ RIGHT    CompressibilityPhasicitySpontaneityPropertiesThrombus Aging +---------+---------------+---------+-----------+----------+--------------+ CFV      Full           Yes      Yes                                 +---------+---------------+---------+-----------+----------+--------------+ SFJ      Full                                                        +---------+---------------+---------+-----------+----------+--------------+ FV Prox  Full           Yes      Yes                                 +---------+---------------+---------+-----------+----------+--------------+ FV Mid   Full           Yes      Yes                                 +---------+---------------+---------+-----------+----------+--------------+  FV DistalFull           Yes      Yes                                 +---------+---------------+---------+-----------+----------+--------------+ PFV      Full                                                        +---------+---------------+---------+-----------+----------+--------------+ POP       Full           Yes      Yes                                 +---------+---------------+---------+-----------+----------+--------------+ PTV      Full                                                        +---------+---------------+---------+-----------+----------+--------------+ PERO     Full                                                        +---------+---------------+---------+-----------+----------+--------------+ Gastroc  None           No       No                   Acute          +---------+---------------+---------+-----------+----------+--------------+   +---------+---------------+---------+-----------+----------+--------------+ LEFT     CompressibilityPhasicitySpontaneityPropertiesThrombus Aging +---------+---------------+---------+-----------+----------+--------------+ CFV      Full           Yes      Yes                                 +---------+---------------+---------+-----------+----------+--------------+ SFJ      Full                                                        +---------+---------------+---------+-----------+----------+--------------+ FV Prox  Full           Yes      Yes                                 +---------+---------------+---------+-----------+----------+--------------+ FV Mid   Full           Yes      Yes                                 +---------+---------------+---------+-----------+----------+--------------+ FV DistalFull  Yes      Yes                                 +---------+---------------+---------+-----------+----------+--------------+ PFV      Full                                                        +---------+---------------+---------+-----------+----------+--------------+ POP      Full           Yes      Yes                                 +---------+---------------+---------+-----------+----------+--------------+ PTV      Full                                                         +---------+---------------+---------+-----------+----------+--------------+ PERO     Full                                                        +---------+---------------+---------+-----------+----------+--------------+ Gastroc  Full                                                        +---------+---------------+---------+-----------+----------+--------------+     Summary: BILATERAL: - No evidence of deep vein thrombosis seen in the lower extremities, bilaterally. -No evidence of popliteal cyst, bilaterally. RIGHT: Findings consistent with acute intramuscular thrombosis involving the right gastrocnemius veins.   *See table(s) above for measurements and observations. Electronically signed by Debby Robertson on 07/24/2024 at 11:33:00 AM.    Final    CT HEAD WO CONTRAST ( ) Result Date: 07/23/2024 EXAM: CT HEAD WITHOUT CONTRAST 07/23/2024 03:38:00 PM TECHNIQUE: CT of the head was performed without the administration of intravenous contrast. Automated exposure control, iterative reconstruction, and/or weight based adjustment of the mA/kV was utilized to reduce the radiation dose to as low as reasonably achievable. COMPARISON: CT angio of head and neck 10/04/2023 CLINICAL HISTORY: Brain/CNS neoplasm, monitor. Non con. Principal Problem: GBM (glioblastoma multiforme) (HCC). Chief complaints: Weakness, Near Syncope, Code Stroke. Status post biopsy. FINDINGS: BRAIN AND VENTRICLES: Right parietal craniotomy for biopsy of the mass is again noted. The operative bed is unchanged. More hyperdense component extending into the right posterior aspect of the corpus callosum with stable. This creates mass effect on the right lateral ventricle. Vasogenic edema in the high right parietal lobe is stable. ORBITS: No acute abnormality. SINUSES: No acute abnormality. SOFT TISSUES AND SKULL: No acute soft tissue abnormality. No skull fracture. IMPRESSION: 1. Stable right parietal operative bed and mass  with more hyperdense component extending into the right posterior aspect of the corpus callosum, creating mass effect on the right lateral ventricle. 2.  Stable vasogenic edema in the high right parietal lobe. Electronically signed by: Lonni Necessary MD 07/23/2024 03:49 PM EDT RP Workstation: [id number]   US  Abdomen Limited RUQ (LIVER/GB) Result Date: 07/23/2024 CLINICAL DATA:  Right upper quadrant abdominal pain EXAM: ULTRASOUND ABDOMEN LIMITED RIGHT UPPER QUADRANT COMPARISON:  None Available. FINDINGS: Gallbladder: Borderline gallbladder wall thickening may be due to nondistention. No sonographic Murphy sign noted by sonographer. Common bile duct: Diameter: 0.4 cm Liver: No focal lesion identified. Nodular liver contour raises suspicion for cirrhosis. Portal vein is patent on color Doppler imaging with normal direction of blood flow towards the liver. Other: None. IMPRESSION: 1. Nodular liver contour raises suspicion for cirrhosis. 2. Borderline gallbladder wall thickening may be due to nondistention. No sonographic Murphy sign noted by sonographer. Electronically Signed   By: Ryan Salvage M.D.   On: 07/23/2024 15:48   Overnight EEG with video Result Date: 07/22/2024 Shelton Arlin KIDD, MD     07/23/2024  7:03 AM Patient Name: KHRISTOPHER KAPAUN MRN: [id number] Epilepsy Attending: Arlin KIDD Shelton Referring Physician/Provider: Khaliqdina, Salman, MD Duration: 07/21/2024 1227 to 07/22/2024 1227  Patient history: 68 y.o. 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 EEG to evaluate for seizure  Level of alertness: awake/ lethargic , asleep  AEDs during EEG study: LEV  Technical aspects: This EEG study was done with scalp electrodes positioned according to the 10-20 International system of electrode placement. Electrical activity was reviewed with band pass filter of 1-70Hz , sensitivity of 7 uV/mm, display speed of 85mm/sec with a 60Hz   notched filter applied as appropriate. EEG data were recorded continuously and digitally stored.  Video monitoring was available and reviewed as appropriate.  Description: The posterior dominant rhythm consists of 7.5 Hz activity of moderate voltage (25-35 uV) seen predominantly in posterior head regions, asymmetric ( right<left) and reactive to eye opening and eye closing. Sleep was characterized by vertex waves, sleep spindles (12 to 14 Hz), maximal frontocentral region. EEG showed continuous 3 to 6 Hz theta-delta slowing right hemisphere, maximal right temporal region. Intermittent generalized 3-5hz  theta-delta slowing was also noted. Hyperventilation and photic stimulation were not performed.    ABNORMALITY - Continuous slow, right hemisphere - Intermittent slow, generalized  IMPRESSION: This study is suggestive of cortical dysfunction arising from right hemisphere likely due to underlying structural abnormality. Additionally there is mild to moderate diffuse encephalopathy. No definite seizures were seen throughout the recording.   Arlin KIDD Shelton   DG CHEST PORT 1 VIEW Result Date: 07/21/2024 CLINICAL DATA:  68 year old male with cough.  Grade 4 Glioma. EXAM: PORTABLE CHEST 1 VIEW COMPARISON:  Portable chest 07/17/2024 and earlier. FINDINGS: Portable AP semi upright view at 0656 hours. Stable cardiomegaly and mediastinal contours. Stable left chest superficial cardiac device. Enteric feeding tube courses to the abdomen, tip not included. Lung volumes are within normal limits. Allowing for portable technique the lungs are clear. Visualized tracheal air column is within normal limits. Stable visualized osseous structures. IMPRESSION: No acute cardiopulmonary abnormality. Electronically Signed   By: VEAR Hurst M.D.   On: 07/21/2024 10:48   CT ANGIO HEAD NECK W WO CM Result Date: 07/21/2024 CLINICAL DATA:  Initial evaluation for acute neuro deficit, stroke. EXAM: CT ANGIOGRAPHY HEAD AND NECK TECHNIQUE:  Multidetector CT imaging of the head and neck was performed using the standard protocol during bolus administration of intravenous contrast. Multiplanar CT image reconstructions and MIPs were obtained  to evaluate the vascular anatomy. Carotid stenosis measurements (when applicable) are obtained utilizing NASCET criteria, using the distal internal carotid diameter as the denominator. RADIATION DOSE REDUCTION: This exam was performed according to the departmental dose-optimization program which includes automated exposure control, adjustment of the mA and/or kV according to patient size and/or use of iterative reconstruction technique. CONTRAST:  75mL OMNIPAQUE  IOHEXOL  350 MG/ML SOLN COMPARISON:  Prior CT from earlier the same day. FINDINGS: CT HEAD FINDINGS Brain: Examination degraded by motion artifact. Patient's right cerebral mass with superimposed hemorrhage again noted, not significantly changed. No new finding from CT performed earlier the same day. Vascular: No visible hyperdense vessel. Skull: No visible new finding. Sinuses/Orbits: No visible new finding. Other: None. Review of the MIP images confirms the above findings CTA NECK FINDINGS Aortic arch: Aortic arch and origin of the great vessels not visualized on this exam. Atheromatous change about the visualized proximal great vessels noted. No visible stenosis. Right carotid system: Right common and internal carotid arteries are patent without stenosis or dissection. Left carotid system: Left common and internal carotid arteries are patent without dissection. Eccentric calcified plaque at the left carotid bulb with estimated 50% stenosis, stable. 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 Distal reconstitution at the V3 segment via muscular collaterals, stable. Vertebral arteries remain patent as they course into the cranial vault. No visible stenosis or dissection. Skeleton: No  worrisome osseous lesions.  Patient is edentulous. Other neck: No other acute finding. Upper chest: No other acute finding. Review of the MIP images confirms the above findings CTA HEAD FINDINGS Anterior circulation: Both internal carotid arteries are patent to the termini without stenosis. A1 segments patent bilaterally. Right A1 hypoplastic. Normal anterior communicating artery complex. Anterior cerebral arteries patent without stenosis. Atheromatous plaque at the right ICA terminus/proximal right M1 segment with short-segment mild-to-moderate stenosis (series 10, image 111). Left M1 segment widely patent. No proximal MCA branch occlusion. Distal MCA branches perfused and symmetric. Posterior circulation: Right V4 segment patent without stenosis. Right PICA not seen. Left vertebral artery patent to the takeoff of the left PICA, with subsequent reocclusion. Left PICA patent. Basilar diminutive but patent without stenosis. Superior cerebellar and posterior cerebral arteries remain patent bilaterally. Venous sinuses: Grossly patent allowing for timing the contrast bolus. Anatomic variants: As above.  No aneurysm. Review of the MIP images confirms the above findings IMPRESSION: 1. Stable CTA of the head and neck. No large vessel occlusion or other emergent finding. 2. 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 Left V4 segment reoccludes beyond the takeoff of the left PICA. 3. Atheromatous plaque at the right ICA terminus/proximal right M1 segment with short-segment mild-to-moderate stenosis. 4. Atheromatous plaque at the left carotid bulb with estimated 50% stenosis, stable. 5. Right cerebral mass with superimposed hemorrhage, not significantly changed from CT performed earlier the same day. Electronically Signed   By: Morene Hoard M.D.   On: 07/21/2024 00:03   CT Head Wo Contrast Result Date: 07/20/2024 CLINICAL  DATA:  Initial evaluation for acute neuro deficit, stroke suspected. EXAM: CT HEAD WITHOUT CONTRAST TECHNIQUE: Contiguous axial images were obtained from the base of the skull through the vertex without intravenous contrast. RADIATION DOSE REDUCTION: This exam was performed according to the departmental dose-optimization program which includes automated exposure control, adjustment of the mA and/or kV according to patient size and/or use of iterative reconstruction  technique. COMPARISON:  Prior CT from 07/18/2024. FINDINGS: Brain: Postoperative changes from prior right temporal craniotomy for tumor biopsy again noted. Postoperative pneumocephalus has resolved. Previously identified infiltrating mass within the right cerebral hemisphere again seen, grossly stable. Focus of hemorrhage within the mass itself has slightly contracted in the interim, now measuring 1.6 x 1.1 x 1.3 cm, previously 1.9 x 1.2 x 1.5 cm. Trace residual hemorrhage along the biopsy tract noted as well, decreased. Similar surrounding vasogenic edema and regional mass effect without significant midline shift. No other new acute intracranial hemorrhage. No visible acute large vessel territory infarct. No other mass lesion or mass effect. No hydrocephalus. No significant extra-axial fluid collection. Vascular: No abnormal hyperdense vessel. Calcified atherosclerosis present at the skull base. Skull: Post craniotomy changes on the right. Skin staples remain in place. Sinuses/Orbits: Globes and orbital soft tissues demonstrate no acute finding. Paranasal sinuses and mastoid air cells remain largely clear. Other: Nasogastric tube in place. IMPRESSION: 1. Postoperative changes from prior right temporal craniotomy for tumor biopsy. Previously identified infiltrating mass within the right cerebral hemisphere is grossly stable. Focus of hemorrhage within the mass itself has slightly contracted in the interim, now measuring 1.6 x 1.1 x 1.3 cm, previously 1.9  x 1.2 x 1.5 cm. Trace residual hemorrhage along the biopsy tract has decreased as well. Similar surrounding vasogenic edema and regional mass effect without significant midline shift. 2. No other new acute intracranial abnormality. Electronically Signed   By: Morene Hoard M.D.   On: 07/20/2024 21:01   CT HEAD WO CONTRAST Result Date: 07/18/2024 CLINICAL DATA:  Initial postoperative evaluation. EXAM: CT HEAD WITHOUT CONTRAST TECHNIQUE: Contiguous axial images were obtained from the base of the skull through the vertex without intravenous contrast. RADIATION DOSE REDUCTION: This exam was performed according to the departmental dose-optimization program which includes automated exposure control, adjustment of the mA and/or kV according to patient size and/or use of iterative reconstruction technique. COMPARISON:  Comparison made with prior CT from 07/08/2024 as well as previous exams. FINDINGS: Brain: Postoperative changes from interval right temporal craniotomy for presumed biopsy of previously identified infiltrating right cerebral mass. Small amount of postoperative pneumocephalus overlies the right cerebral convexity. Focus of hemorrhage measuring up to 1.4 cm seen along the biopsy tract at the posterior right temporal lobe (series 2, image 15). Additional focus of hemorrhage within the resection cavity itself measures 1.9 x 1.2 x 1.5 cm (series 2, image 18). No other complicating features. The underlying infiltrating mass involving the right temporal lobe with extension into the right splenium is otherwise grossly stable. Surrounding vasogenic edema with mass effect on the posterior right lateral ventricle, stable. No midline shift. Basilar cisterns remain patent. Remainder of the brain is otherwise stable. No other acute intracranial hemorrhage. No acute large vessel territory infarct. No other mass lesion or mass effect. No hydrocephalus or extra-axial fluid collection. Vascular: No abnormal hyperdense  vessel. Calcified atherosclerosis present at the skull base. Skull: Post craniotomy changes on the right. Skin staples in subgaleal drain remain in place. No adverse features. Sinuses/Orbits: Globes orbital soft tissues demonstrate no acute finding. Paranasal sinuses are largely clear. Small right with trace left mastoid effusions, of doubtful significance. Nasogastric tube in place. Other: None. IMPRESSION: 1. Postoperative changes from interval right temporal craniotomy for biopsy of patient's known right cerebral mass. Small volume postoperative hemorrhage along the biopsy tract and within the resection cavity as above. No other complicating features. The underlying right cerebral mass is otherwise grossly stable. 2.  No other new acute intracranial abnormality. Electronically Signed   By: Morene Hoard M.D.   On: 07/18/2024 02:12   DG CHEST PORT 1 VIEW Result Date: 07/17/2024 CLINICAL DATA:  [id number] Respiratory failure (HCC) 33498. EXAM: PORTABLE CHEST 1 VIEW COMPARISON:  07/13/2024. FINDINGS: There is nonspecific heterogeneous opacity overlying the lateral aspect of the right mid lung zone, which was likely present on the prior radiograph. This is indeterminate. Bilateral lung fields are otherwise clear. No acute consolidation or lung collapse. Bilateral costophrenic angles are clear. Stable cardio-mediastinal silhouette. Presumed loop recorder device noted overlying the left lower chest. No acute osseous abnormalities. The soft tissues are within normal limits. Enteric tube is seen coursing below the left hemidiaphragm however, the tip and side hole are not included in the film. IMPRESSION: *No acute cardiopulmonary abnormality. Nonspecific heterogeneous opacity overlying the lateral aspect of the right mid lung zone, which was likely present on the prior radiograph. This is indeterminate. Correlate clinically to determine the need for further evaluation with nonemergent chest CT scan. Electronically  Signed   By: Ree Molt M.D.   On: 07/17/2024 08:18   DG Chest Port 1 View Result Date: 07/13/2024 CLINICAL DATA:  Respiratory distress. EXAM: PORTABLE CHEST 1 VIEW COMPARISON:  Chest radiograph dated 07/11/2024 FINDINGS: Enteric tube extends below the diaphragm with tip beyond the inferior margin of the image. Right-sided PICC with tip over central SVC. There is cardiomegaly with vascular congestion and edema, significantly progressed since the prior radiograph. Pneumonia is not excluded. A small left pleural effusion may be present. No pneumothorax. A loop recorder device. No acute osseous pathology. IMPRESSION: Cardiomegaly with findings of CHF, significantly progressed since the prior radiograph. Pneumonia is not excluded. Electronically Signed   By: Vanetta Chou M.D.   On: 07/13/2024 19:24   DG Chest Port 1 View Result Date: 07/11/2024 CLINICAL DATA:  [id number].  Pneumonia. EXAM: PORTABLE CHEST 1 VIEW COMPARISON:  Portable chest yesterday at 5:05 p.m. FINDINGS: 5:48 a.m. ETT tip is 3.6 cm from the carina. NGT enters the stomach with the intragastric course not fully seen. Right PICC tip approaches the superior cavoatrial junction. There is a left chest loop recorder device. Stable cardiomegaly. There is mild perihilar vascular prominence but no overt edema. There are small layering pleural effusions, asymmetric consolidation left lower lobe. Remaining lungs appear clear.  No new or worsening findings. Stable mediastinum with calcification in the transverse aorta. Stable osseous structures. IMPRESSION: 1. No significant change from yesterday's study. 2. Stable cardiomegaly and mild perihilar vascular prominence. 3. Small layering pleural effusions and left lower lobe consolidation. Electronically Signed   By: Francis Quam M.D.   On: 07/11/2024 07:52   DG CHEST PORT 1 VIEW Result Date: 07/10/2024 CLINICAL DATA:  Central line placement. Clinical notes confirm venous placement. EXAM: PORTABLE CHEST 1  VIEW COMPARISON:  Earlier today FINDINGS: Unchanged positioning of right upper extremity PICC, the tip projects over the mid mediastinum to the left of midline. Endotracheal tube tip 3.7 cm from the carina. Enteric tube tip below the diaphragm cardiomegaly is unchanged. Left chest wall loop recorder. Unchanged left lung base opacity and pleural effusion. No pneumothorax or new airspace disease. IMPRESSION: 1. Unchanged positioning of right upper extremity PICC, the tip projects over the mid mediastinum to the left of midline. Given confirmation of venous placement clinically, this is in the region of the mid SVC. 2. Unchanged left lung base opacity and pleural effusion. Electronically Signed   By: Andrea Marlee HERO.D.  On: 07/10/2024 20:41   DG CHEST PORT 1 VIEW Result Date: 07/10/2024 CLINICAL DATA:  [id number] S/P PICC central line placement [id number] EXAM: PORTABLE CHEST 1 VIEW COMPARISON:  Chest x-ray 07/10/2024 FINDINGS: Endotracheal tube terminates 4 cm above the carina. Right PICC with tip overlying mid mediastinum just inferior to the carina. Enteric tube courses below the hemidiaphragm with tip and side port collimated off view. Wireless cardiac device overlies the left chest. The heart and mediastinal contours are unchanged. Persistent left lower lung zone opacity. No pulmonary edema. No right pleural effusion. Possible left pleural effusion. No pneumothorax. No acute osseous abnormality. IMPRESSION: 1. Right PICC with tip overlying mid mediastinum just inferior to the carina. Question malpositioning/arterial positioning. Consider repeat chest x-ray and possible use of CT for further evaluation. 2. Persistent left lower lung zone opacity. Possible left pleural effusion. These results will be called to the ordering clinician or representative by the Radiologist Assistant, and communication documented in the PACS or [employer]. Electronically Signed   By: Morgane  Naveau M.D.   On: 07/10/2024 16:00   US   EKG SITE RITE Result Date: 07/10/2024 If Site Rite image not attached, placement could not be confirmed due to current cardiac rhythm.  DG Chest Port 1 View Result Date: 07/10/2024 CLINICAL DATA:  Interest per dependence, endotracheal tube EXAM: PORTABLE CHEST 1 VIEW COMPARISON:  July 07, 2024 chest radiograph FINDINGS: Endotracheal tube in place. Partially imaged enteric tube. Unchanged cardiac loop recorder projecting over the left chest wall. Bibasilar opacities, slightly increased from prior and may represent atelectasis. Unchanged cardiomediastinal silhouette. IMPRESSION: Unchanged support devices. Slightly increased bibasilar opacities, possibly representing atelectasis. Electronically Signed   By: Michaeline Blanch M.D.   On: 07/10/2024 11:44   Overnight EEG with video Result Date: 07/08/2024 Shelton Arlin KIDD, MD     07/09/2024  7:10 AM Patient Name: ALEKSANDR PELLOW MRN: [id number] Epilepsy Attending: Arlin KIDD Shelton Referring Physician/Provider: Khaliqdina, Salman, MD Duration: 07/07/2024 9070 to 07/08/2024 9070 Patient history: 68 y.o. 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 EEG to evaluate for seizure Level of alertness: comatose/ lethargic AEDs during EEG study: LEV, propofol , Versed ,VPA Technical aspects: This EEG study was done with scalp electrodes positioned according to the 10-20 International system of electrode placement. Electrical activity was reviewed with band pass filter of 1-70Hz , sensitivity of 7 uV/mm, display speed of 44mm/sec with a 60Hz  notched filter applied as appropriate. EEG data were recorded continuously and digitally stored.  Video monitoring was available and reviewed as appropriate. Description: EEG showed continuous generalized and lateralized right hemisphere 3 to 6 Hz theta-delta slowing admixed with 13-15hz  beta activity. At the beginning of the study, EEG also showed lateralized periodic  discharges ( LPDs) with overriding fast activity at 1-1.5Hz  in right hemisphere. As sedation was adjusted, overriding fast activity improved Hyperventilation and photic stimulation were not performed.   ABNORMALITY - Lateralized periodic discharges with overriding fast activity ( LPD +F)  right hemisphere - Continuous slow, generalized and lateralized right hemisphere IMPRESSION: This study showed evidence of epileptogenicity and cortical dysfunction arising from right hemisphere likely due to underlying structural abnormality. This eeg pattern is on the ictal-interictal continuum with high risk of seizure recurrence. Additionally there is severe diffuse encephalopathy, likely related to sedation. No seizures were seen throughout the recording. Priyanka KIDD Shelton   CT HEAD WO CONTRAST ( ) Result Date: 07/08/2024 CLINICAL DATA:  Initial evaluation  for acute mental status change, unknown cause. EXAM: CT HEAD WITHOUT CONTRAST TECHNIQUE: Contiguous axial images were obtained from the base of the skull through the vertex without intravenous contrast. RADIATION DOSE REDUCTION: This exam was performed according to the departmental dose-optimization program which includes automated exposure control, adjustment of the mA and/or kV according to patient size and/or use of iterative reconstruction technique. COMPARISON:  Comparison made with prior studies from 07/07/2024. FINDINGS: Brain: Examination somewhat technically limited by streak artifact from overlying EEG leads. Previously identified infiltrating mass involving the right temporal lobe and posterior right splenium again seen, relatively stable from prior. Surrounding vasogenic edema, also unchanged. No acute intracranial hemorrhage. No other acute large vessel territory infarct. No other mass lesion or mass effect. No hydrocephalus or extra-axial fluid collection. Few small chronic cerebellar infarcts again noted. Vascular: No abnormal hyperdense vessel. Calcified  atherosclerosis present at skull base. Skull: No acute finding.  EEG leads overlie the scalp. Sinuses/Orbits: Globes and orbital soft tissues within normal limits. Paranasal sinuses and mastoid air cells remain largely clear. Other: Endotracheal tube partially visualized. IMPRESSION: 1. Stable head CT with infiltrating mass involving the posterior right temporal lobe and splenium with surrounding vasogenic edema. 2. No other new acute intracranial abnormality. Electronically Signed   By: Morene Hoard M.D.   On: 07/08/2024 02:47   ECHOCARDIOGRAM COMPLETE Result Date: 07/07/2024    ECHOCARDIOGRAM REPORT   Patient Name:   DONYA TOMARO Date of Exam: 07/07/2024 Medical Rec #:  [id number]     Height:       69.0 in Accession #:    [id number]    Weight:       163.0 lb Date of Birth:  21-Jan-1956      BSA:          1.894 m Patient Age:    68 years      BP:           104/66 mmHg Patient Gender: M             HR:           69 bpm. Exam Location:  Inpatient Procedure: 2D Echo, Color Doppler and Cardiac Doppler (Both Spectral and Color            Flow Doppler were utilized during procedure). Indications:    Stroke  History:        Patient has prior history of Echocardiogram examinations, most                 recent 06/11/2022.  Sonographer:    Tinnie Rgers RDCS Referring Phys: [id number] COURTNEY F HORTON IMPRESSIONS  1. Left ventricular ejection fraction, by estimation, is 60 to 65%. Left ventricular ejection fraction by PLAX is 61 %. The left ventricle has normal function. The left ventricle has no regional wall motion abnormalities. There is moderate left ventricular hypertrophy. Left ventricular diastolic function could not be evaluated.  2. Right ventricular systolic function is moderately reduced. The right ventricular size is mildly enlarged.  3. Left atrial size was moderately dilated.  4. Right atrial size was severely dilated.  5. The mitral valve is grossly normal. Trivial mitral valve regurgitation.  6. The aortic  valve is calcified. Aortic valve regurgitation is mild. Aortic valve sclerosis/calcification is present, without any evidence of aortic stenosis.  7. The inferior vena cava is dilated in size with <50% respiratory variability, suggesting right atrial pressure of 15 mmHg.  8. Cannot exclude a small PFO.  9. Rhythm  strip during this exam demonstrates atrial fibrillation. Comparison(s): Changes from prior study are noted. 03/15/2024: LVEF 65-70%, mildly dilated RV with normal systolic function. FINDINGS  Left Ventricle: Left ventricular ejection fraction, by estimation, is 60 to 65%. Left ventricular ejection fraction by PLAX is 61 %. The left ventricle has normal function. The left ventricle has no regional wall motion abnormalities. The left ventricular internal cavity size was normal in size. There is moderate left ventricular hypertrophy. Left ventricular diastolic function could not be evaluated due to atrial fibrillation. Left ventricular diastolic function could not be evaluated. Right Ventricle: The right ventricular size is mildly enlarged. No increase in right ventricular wall thickness. Right ventricular systolic function is moderately reduced. Left Atrium: Left atrial size was moderately dilated. Right Atrium: Right atrial size was severely dilated. Pericardium: There is no evidence of pericardial effusion. Mitral Valve: The mitral valve is grossly normal. Trivial mitral valve regurgitation. Tricuspid Valve: The tricuspid valve is grossly normal. Tricuspid valve regurgitation is trivial. Aortic Valve: The aortic valve is calcified. Aortic valve regurgitation is mild. Aortic valve sclerosis/calcification is present, without any evidence of aortic stenosis. Pulmonic Valve: The pulmonic valve was normal in structure. Pulmonic valve regurgitation is not visualized. Aorta: The aortic root and ascending aorta are structurally normal, with no evidence of dilitation. Venous: The inferior vena cava is dilated in size  with less than 50% respiratory variability, suggesting right atrial pressure of 15 mmHg. IAS/Shunts: Cannot exclude a small PFO. EKG: Rhythm strip during this exam demonstrates atrial fibrillation.  LEFT VENTRICLE PLAX 2D LV EF:         Left            Diastology                ventricular     LV e' lateral: 14.10 cm/s                ejection                fraction by                PLAX is 61                %. LVIDd:         4.27 cm LVIDs:         2.88 cm LV PW:         1.29 cm LV IVS:        1.24 cm LVOT diam:     2.00 cm LV SV:         44 LV SV Index:   23 LVOT Area:     3.14 cm  RIGHT VENTRICLE            IVC RV S prime:     6.66 cm/s  IVC diam: 2.23 cm TAPSE (M-mode): 1.3 cm LEFT ATRIUM             Index        RIGHT ATRIUM           Index LA diam:        4.67 cm 2.47 cm/m   RA Area:     24.70 cm LA Vol (A2C):   73.9 ml 39.02 ml/m  RA Volume:   73.70 ml  38.91 ml/m LA Vol (A4C):   72.9 ml 38.49 ml/m LA Biplane Vol: 76.0 ml 40.13 ml/m  AORTIC VALVE LVOT Vmax:   76.60 cm/s LVOT Vmean:  47.000 cm/s LVOT  VTI:    0.140 m  AORTA Ao Root diam: 3.04 cm Ao Asc diam:  2.92 cm  SHUNTS Systemic VTI:  0.14 m Systemic Diam: 2.00 cm Vinie Maxcy MD Electronically signed by Vinie Maxcy MD Signature Date/Time: 07/07/2024/4:18:11 PM    Final    DG Chest Portable 1 View Result Date: 07/07/2024 CLINICAL DATA:  Intubation. EXAM: PORTABLE CHEST 1 VIEW COMPARISON:  August 08, 2022. FINDINGS: Stable cardiomegaly. Endotracheal and nasogastric tubes are in grossly good position. Lungs are clear. Bony thorax is unremarkable. IMPRESSION: Endotracheal and nasogastric tubes are in grossly good position. Electronically Signed   By: Lynwood Landy Raddle M.D.   On: 07/07/2024 13:25   DG Abd Portable 1 View Result Date: 07/07/2024 CLINICAL DATA:  Nasogastric tube placement. EXAM: PORTABLE ABDOMEN - 1 VIEW COMPARISON:  None Available. FINDINGS: Two images of the abdomen were obtained. On the second image, nasogastric tube tip is  seen in expected position of proximal stomach. IMPRESSION: Nasogastric tube tip is seen in expected position of proximal stomach. Electronically Signed   By: Lynwood Landy Raddle M.D.   On: 07/07/2024 13:23   MR BRAIN W WO CONTRAST Result Date: 07/07/2024 CLINICAL DATA:  68 year old male with new onset seizure. Code stroke presentation. EXAM: MRI HEAD WITHOUT AND WITH CONTRAST TECHNIQUE: Multiplanar, multiecho pulse sequences of the brain and surrounding structures were obtained without and with intravenous contrast. CONTRAST:  7.5mL GADAVIST  GADOBUTROL  1 MMOL/ML IV SOLN COMPARISON:  Head CTs and CTA earlier today. FINDINGS: Study is mildly degraded by motion artifact despite repeated imaging attempts. Brain: Nodular, heterogeneously enhancing, and elongated tumor in the posterior right hemisphere tracks from the splenium of the corpus callosum near midline laterally into the right temporal lobe. Enhancing component of tumor encompasses an area of about 3 cm by close to 6 cm x 3.3 cm (AP by transverse by CC). Surrounding confluent T2 and FLAIR hyperintensity. Mild hemosiderin or mineralization on T2*. Abnormal diffusion which appears to be on the basis of hypercellularity, with mildly diminished T2 hyperintensity in those areas. Confluent periventricular involvement along the posterior right lateral ventricle, atrium. No contralateral or additional abnormal enhancement identified. No dural thickening identified. No midline shift or significant intracranial mass effect at this time. Normal basilar cisterns. No superimposed restricted diffusion suggestive of acute infarction. No ventriculomegaly, extra-axial collection or acute intracranial hemorrhage. Cervicomedullary junction and pituitary are within normal limits. 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 No other hemosiderin identified. No cortical encephalomalacia identified. Coronal thin slice imaging. Tumoral edema  adjacent to the right hippocampal formation which otherwise appears within normal limits. Other mesial temporal lobe structures within normal limits. Vascular: Major intracranial vascular flow voids appear preserved. Following contrast the major dural venous sinuses are enhancing and appear to be patent. Skull and upper cervical spine: Negative. Visualized bone marrow signal is within normal limits. Sinuses/Orbits: Leftward gaze, otherwise negative. Paranasal Visualized paranasal sinuses and mastoids are stable and well aerated. Other: Visible internal auditory structures appear normal. Scalp EEG leads in place. IMPRESSION: 1. Positive for relatively large and infiltrative tumor with heterogeneous enhancement tracking from the splenium of the corpus callosum near midline into the right temporal lobe. Confluent regional tumoral edema. No midline shift or significant intracranial mass effect. Top differential considerations are Glioblastoma and CNS Lymphoma. 2. No other acute intracranial abnormality, with underlying small chronic cerebellar infarcts. Electronically Signed   By: VEAR Hurst M.D.   On: 07/07/2024 08:53   CT HEAD WO CONTRAST (  ) Result Date: 07/07/2024 CLINICAL DATA:  Initial evaluation for acute mental status change, unknown cause. EXAM: CT HEAD WITHOUT CONTRAST TECHNIQUE: Contiguous axial images were obtained from the base of the skull through the vertex without intravenous contrast. RADIATION DOSE REDUCTION: This exam was performed according to the departmental dose-optimization program which includes automated exposure control, adjustment of the mA and/or kV according to patient size and/or use of iterative reconstruction technique. COMPARISON:  Comparison made with CTs from earlier the same day. FINDINGS: Brain: IV contrast materials on board related to CTA performed earlier the same evening. Now evident is a probable enhancing intra-axial mass involving the posterior right frontotemporal region,  in the region of previously suspected right MCA infarct. The mass is enhancing and appears bilobed. Dominant component at the right temporal region measures 2.9 cm (series 5, image 15). Additional dominant component partially extending towards the midline at the splenium measures up to 3.7 cm (series 5, image 18). Surrounding hypodensity therefore likely reflects vasogenic edema. Mild mass effect on the adjacent right lateral ventricle without midline shift. No other visible mass lesion or abnormal enhancement. No acute intracranial hemorrhage. No acute large vessel territory infarct. No extra-axial fluid collection. Suspected small remote right cerebellar infarct noted. Vascular: IV contrast material seen throughout the intracranial vasculature. Calcified atherosclerosis present at skull base. Skull: Scalp soft tissues and calvarium demonstrate no new finding. Sinuses/Orbits: Globes orbital soft tissues demonstrate no acute finding. Paranasal sinuses and mastoid air cells remain clear. Other: None. IMPRESSION: 1. Now evident is an enhancing intra-axial mass involving the posterior right frontotemporal region, in the region of previously suspected right MCA infarct. The enhancing mass is bilobed, with dominant components measuring 2.9 cm and 3.7 cm at the right temporal lobe and splenium respectively. Surrounding vasogenic edema without midline shift. 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 Further evaluation with dedicated MRI, with and without contrast, recommended. 2. No other acute intracranial abnormality. Case briefly discussed by telephone at the time of interpretation on 07/07/2024 at 1:35 a.m. to provider [facility]. Electronically Signed   By: Morene Hoard M.D.   On: 07/07/2024 01:46   CT ANGIO HEAD NECK W WO CM (CODE STROKE) Result Date: 07/07/2024 CLINICAL DATA:  Initial evaluation for acute neuro deficit, stroke. EXAM: CT ANGIOGRAPHY HEAD AND NECK WITH AND  WITHOUT CONTRAST TECHNIQUE: Multidetector CT imaging of the head and neck was performed using the standard protocol during bolus administration of intravenous contrast. Multiplanar CT image reconstructions and MIPs were obtained to evaluate the vascular anatomy. Carotid stenosis measurements (when applicable) are obtained utilizing NASCET criteria, using the distal internal carotid diameter as the denominator. RADIATION DOSE REDUCTION: This exam was performed according to the departmental dose-optimization program which includes automated exposure control, adjustment of the mA and/or kV according to patient size and/or use of iterative reconstruction technique. CONTRAST:  75mL OMNIPAQUE  IOHEXOL  350 MG/ML SOLN COMPARISON:  CT from earlier the same day. FINDINGS: CTA NECK FINDINGS Aortic arch: Aortic arch within normal limits for caliber standard branch pattern. Aortic atherosclerosis. No significant stenosis about the origin the great vessels. Right carotid system: No evidence of dissection, stenosis (50% or greater), or occlusion. Left carotid system: Left common and internal carotid arteries are patent without dissection. Mixed plaque about the left carotid bulb with estimated 50% stenosis by NASCET criteria. 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 Distal reconstitution at the V3 segments via muscular collaterals, greater on  the right (series 5, image 165, 160). Vertebral arteries are patent as they course into the cranial vault. No visible stenosis or dissection. Skeleton: No worrisome osseous lesions.  Patient is edentulous. Other neck: No other acute finding. Upper chest: No other acute finding. Review of the MIP images confirms the above findings CTA HEAD FINDINGS Anterior circulation: Both internal carotid arteries are patent through the siphons without stenosis. A1 segments patent bilaterally. Right A1 hypoplastic. Normal anterior  communicating artery complex. Anterior cerebral arteries patent without stenosis. No M1 stenosis or occlusion. No proximal MCA branch occlusion. Distal MCA branches perfused and fairly symmetric. Posterior circulation: Right V4 segment patent without stenosis. Left V4 segment patent proximally to the takeoff of the left PICA, but largely acute lose distally to the vertebrobasilar junction. Left PICA patent. Right PICA not well seen. Basilar diminutive but patent without stenosis. Superior cerebral arteries patent bilaterally. Left PCA supplied via the basilar. Right PCA supplied via the basilar as well as a robust right posterior communicating artery. Both PCAs patent to their distal aspects without significant stenosis. Venous sinuses: Grossly patent allowing for timing the contrast bolus. Anatomic variants: As above.  No aneurysm. Review of the MIP images confirms the above findings IMPRESSION: 1. Negative CTA for acute large vessel occlusion or other emergent finding. 2. 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 Distal left V4 segment subsequently reoccludes beyond the takeoff of the left PICA. 3. Atheromatous change about the left carotid bulb with estimated 50% stenosis by NASCET criteria. Aortic Atherosclerosis (ICD10-I70.0). Electronically Signed   By: Morene Hoard M.D.   On: 07/07/2024 00:50   CT HEAD CODE STROKE WO CONTRAST Result Date: 07/07/2024 CLINICAL DATA:  Code stroke. Initial evaluation for acute neuro deficit, stroke. No other relevant history provided. EXAM: CT HEAD WITHOUT CONTRAST TECHNIQUE: Contiguous axial images were obtained from the base of the skull through the vertex without intravenous contrast. RADIATION DOSE REDUCTION: This exam was performed according to the departmental dose-optimization program which includes automated exposure control, adjustment of the mA and/or kV according to patient  size and/or use of iterative reconstruction technique. COMPARISON:  None Available. FINDINGS: Brain: Cerebral volume within normal limits. 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 No associated hemorrhage. No significant mass effect. Gray-white matter differentiation otherwise maintained. No mass lesion or midline shift. No hydrocephalus or extra-axial fluid collection. Vascular: No convincing hyperdense vessel by CT. Scattered vascular calcifications noted within the carotid siphons. Skull: Scalp soft tissues within normal limits.  Calvarium intact. Sinuses/Orbits: Globes orbital soft tissues within normal limits. Paranasal sinuses and mastoid air cells are largely clear. Other: None. ASPECTS [facility] Stroke Program Early CT Score) - Ganglionic level infarction (caudate, lentiform nuclei, internal capsule, insula, M1-M3 cortex): 6 - Supraganglionic infarction (M4-M6 cortex): 2 Total score (0-10 with 10 being normal): 8 IMPRESSION: 1. 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 No associated hemorrhage or significant mass effect. 2. Aspects is 8. These results were communicated to Dr. Vanessa at 12:18 am on 07/07/2024 by text page via the [facility] messaging system. Electronically Signed   By: Morene Hoard M.D.   On: 07/07/2024 00:19

## 2024-07-27 NOTE — Assessment & Plan Note (Addendum)
 Recently with improved po intake. Still with smallbore feeding tube (Cortrak) left nare. Continue to follow goals of care discussions with palliative. RD continues to follow. - Feeding supplement (Jevity 1.5 Cal/fiber) liquid at 45 mL/hr - Feeding supplement (Prosource TF 20) liquid 60 mL daily - Fiber supplement (Banatrol TF) liquid 60 mL BID - Mupirocin  ointment 2% twice daily - Protonix  40 mg daily - Appreciate nutrition, palliative recs

## 2024-07-27 NOTE — Assessment & Plan Note (Addendum)
 Rate controlled. Initially anticoagulated with heparin  following discussion with family on 9/15, now switching to Eliquis  as above. - Rate control: Coreg  12.5 mg twice daily - Anticoagulation: Eliquis  5 mg BID

## 2024-07-27 NOTE — Plan of Care (Signed)
  Problem: Activity: Goal: Ability to tolerate increased activity will improve Outcome: Progressing   Problem: Respiratory: Goal: Ability to maintain a clear airway and adequate ventilation will improve Outcome: Progressing   Problem: Role Relationship: Goal: Method of communication will improve Outcome: Progressing   Problem: Education: Goal: Knowledge of General Education information will improve Description: Including pain rating scale, medication(s)/side effects and non-pharmacologic comfort measures Outcome: Progressing   Problem: Health Behavior/Discharge Planning: Goal: Ability to manage health-related needs will improve Outcome: Progressing   Problem: Clinical Measurements: Goal: Ability to maintain clinical measurements within normal limits will improve Outcome: Progressing Goal: Will remain free from infection Outcome: Progressing Goal: Diagnostic test results will improve Outcome: Progressing Goal: Respiratory complications will improve Outcome: Progressing Goal: Cardiovascular complication will be avoided Outcome: Progressing   Problem: Activity: Goal: Risk for activity intolerance will decrease Outcome: Progressing   Problem: Nutrition: Goal: Adequate nutrition will be maintained Outcome: Progressing   Problem: Coping: Goal: Level of anxiety will decrease Outcome: Progressing   Problem: Elimination: Goal: Will not experience complications related to bowel motility Outcome: Progressing Goal: Will not experience complications related to urinary retention Outcome: Progressing   Problem: Pain Managment: Goal: General experience of comfort will improve and/or be controlled Outcome: Progressing   Problem: Safety: Goal: Ability to remain free from injury will improve Outcome: Progressing   Problem: Skin Integrity: Goal: Risk for impaired skin integrity will decrease Outcome: Progressing   Problem: Education: Goal: Ability to describe self-care  measures that may prevent or decrease complications (Diabetes Survival Skills Education) will improve Outcome: Progressing Goal: Individualized Educational Video(s) Outcome: Progressing   Problem: Coping: Goal: Ability to adjust to condition or change in health will improve Outcome: Progressing   Problem: Fluid Volume: Goal: Ability to maintain a balanced intake and output will improve Outcome: Progressing   Problem: Health Behavior/Discharge Planning: Goal: Ability to identify and utilize available resources and services will improve Outcome: Progressing Goal: Ability to manage health-related needs will improve Outcome: Progressing   Problem: Metabolic: Goal: Ability to maintain appropriate glucose levels will improve Outcome: Progressing   Problem: Nutritional: Goal: Maintenance of adequate nutrition will improve Outcome: Progressing Goal: Progress toward achieving an optimal weight will improve Outcome: Progressing   Problem: Skin Integrity: Goal: Risk for impaired skin integrity will decrease Outcome: Progressing   Problem: Tissue Perfusion: Goal: Adequacy of tissue perfusion will improve Outcome: Progressing   Problem: Education: Goal: Knowledge of the prescribed therapeutic regimen will improve Outcome: Progressing   Problem: Clinical Measurements: Goal: Usual level of consciousness will be regained or maintained. Outcome: Progressing Goal: Neurologic status will improve Outcome: Progressing Goal: Ability to maintain intracranial pressure will improve Outcome: Progressing   Problem: Skin Integrity: Goal: Demonstration of wound healing without infection will improve Outcome: Progressing

## 2024-07-28 ENCOUNTER — Encounter: Admitting: Neurosurgery

## 2024-07-28 ENCOUNTER — Encounter (HOSPITAL_COMMUNITY): Payer: Self-pay

## 2024-07-28 ENCOUNTER — Inpatient Hospital Stay (HOSPITAL_COMMUNITY)

## 2024-07-28 DIAGNOSIS — I8290 Acute embolism and thrombosis of unspecified vein: Secondary | ICD-10-CM | POA: Diagnosis not present

## 2024-07-28 DIAGNOSIS — Z515 Encounter for palliative care: Secondary | ICD-10-CM | POA: Diagnosis not present

## 2024-07-28 DIAGNOSIS — G9389 Other specified disorders of brain: Secondary | ICD-10-CM | POA: Diagnosis not present

## 2024-07-28 DIAGNOSIS — G9340 Encephalopathy, unspecified: Secondary | ICD-10-CM | POA: Diagnosis not present

## 2024-07-28 DIAGNOSIS — C719 Malignant neoplasm of brain, unspecified: Secondary | ICD-10-CM | POA: Diagnosis not present

## 2024-07-28 LAB — GLUCOSE, CAPILLARY
Glucose-Capillary: 194 mg/dL — ABNORMAL HIGH (ref 70–99)
Glucose-Capillary: 211 mg/dL — ABNORMAL HIGH (ref 70–99)
Glucose-Capillary: 223 mg/dL — ABNORMAL HIGH (ref 70–99)
Glucose-Capillary: 234 mg/dL — ABNORMAL HIGH (ref 70–99)
Glucose-Capillary: 268 mg/dL — ABNORMAL HIGH (ref 70–99)
Glucose-Capillary: 291 mg/dL — ABNORMAL HIGH (ref 70–99)
Glucose-Capillary: 92 mg/dL (ref 70–99)
Glucose-Capillary: 98 mg/dL (ref 70–99)

## 2024-07-28 LAB — CBC
HCT: 45.6 % (ref 39.0–52.0)
Hemoglobin: 15.4 g/dL (ref 13.0–17.0)
MCH: 30.5 pg (ref 26.0–34.0)
MCHC: 33.8 g/dL (ref 30.0–36.0)
MCV: 90.3 fL (ref 80.0–100.0)
Platelets: 611 K/uL — ABNORMAL HIGH (ref 150–400)
RBC: 5.05 MIL/uL (ref 4.22–5.81)
RDW: 12.8 % (ref 11.5–15.5)
WBC: 14.6 K/uL — ABNORMAL HIGH (ref 4.0–10.5)
nRBC: 0 % (ref 0.0–0.2)

## 2024-07-28 LAB — BASIC METABOLIC PANEL WITH GFR
Anion gap: 14 (ref 5–15)
BUN: 31 mg/dL — ABNORMAL HIGH (ref 8–23)
CO2: 22 mmol/L (ref 22–32)
Calcium: 8.8 mg/dL — ABNORMAL LOW (ref 8.9–10.3)
Chloride: 101 mmol/L (ref 98–111)
Creatinine, Ser: 0.65 mg/dL (ref 0.61–1.24)
GFR, Estimated: >60 mL/min (ref >60)
Glucose, Bld: 124 mg/dL — ABNORMAL HIGH (ref 70–99)
Potassium: 4.6 mmol/L (ref 3.5–5.1)
Sodium: 137 mmol/L (ref 135–145)

## 2024-07-28 MED ORDER — FREE WATER
100.0000 mL | Status: DC
Start: 2024-07-28 — End: 2024-07-31
  Administered 2024-07-28 – 2024-07-31 (×16): 100 mL

## 2024-07-28 MED ORDER — BANATROL TF EN LIQD
60.0000 mL | Freq: Every day | ENTERAL | Status: DC
Start: 2024-07-29 — End: 2024-07-31
  Administered 2024-07-29 – 2024-07-31 (×3): 60 mL via ORAL
  Filled 2024-07-28 (×3): qty 60

## 2024-07-28 MED ORDER — KATE FARMS STANDARD 1.4 EN LIQD
1000.0000 mL | Freq: Every day | ENTERAL | Status: DC
  Administered 2024-07-28 – 2024-07-30 (×3): 1000 mL via ORAL
  Filled 2024-07-28 (×5): qty 1000

## 2024-07-28 NOTE — Assessment & Plan Note (Addendum)
 Suspected fungal etiology. - Clotrimazole  cream twice daily - Gerhardt's butt cream TID  - Desitin 40% ointment  - Imodium  4mg  prn - Consider rectal tube if diarrhea continues - Turn patient every 2 hours

## 2024-07-28 NOTE — Assessment & Plan Note (Addendum)
 Smallbore feeding tube (Cortrak) left nare.  No percent meals recorded last 24 hours, nor p.o. intake recorded-will message nursing. -RD consulted, appreciate recs - Tube feeding via Cortrak: [name] 1.4 @ 63 mL/hr x 16 hours (1000 mL/day) - PROSource TF20 60 mL 1x/d - Fiber supplement (Banatrol TF) liquid 60 mL BID  - Mupirocin  ointment 2% twice daily - Protonix  40 mg daily

## 2024-07-28 NOTE — Progress Notes (Signed)
 Inpatient Rehab Admissions Coordinator:   CIR has been following peripherally for P/GOC.  Discussed with team yesterday and plan for family meeting with medical team and palliative next week to discuss options/directions of care.  Per documentation pt is making some progress with AMS, but continues to be profoundly limited in his ability to complete mobility/ADLs.  He has consistently required near total or total assist of 2 people for all mobility/ADLs throughout this hospital stay.  Oncology does not feel this can be explained by his tumor and cranial imaging remains stable/improved.  We will continue to follow for therapy progress over the weekend/into next week to see whether rehab may be an option for him.    Reche Lowers, PT, DPT Admissions Coordinator [phone] 07/28/24  12:03 PM

## 2024-07-28 NOTE — Assessment & Plan Note (Addendum)
 Right gastrocnemius veins. No swelling of lower extremity, no pain. - Anticoagulation as above

## 2024-07-28 NOTE — Assessment & Plan Note (Addendum)
 Urinary retention - Foley in place (failed void trail); on Flomax  0.4mg  HTN -normotensive this a.m. Continue home Coreg  12.5 mg BID. Holding home Losartan . Discontinued hydralazine  9/12. Systolic goal <160.  HLD - Continue home Atorvastatin  40mg  daily

## 2024-07-28 NOTE — Assessment & Plan Note (Addendum)
 HR in goal today. - Coreg  12.5 mg twice daily - Eliquis  5 mg BID

## 2024-07-28 NOTE — Assessment & Plan Note (Addendum)
 Remains without seizure. - Neurology consulted, recs appreciated - Neurosurgery consulted, appreciate recommendations  - Palliative care consulted, appreciate assistance in patient's care - Oncology consulted, appreciate recommendations -Repeat stat CT head if there is acute change in mental status or new neurological deficits - Keppra  1000 mg BID - Versed  prn for clinical seizures - IV Dexamethasone  2 mg Q8H (8am, 1pm, and 6pm along with other nightly medications at 6pm) - Taper per neurology: reduce to 2 mg q12h after 2 weeks (on 10/1), then after 2 weeks reduce to 2mg  once daily (on 10/15) - Glucose regimen: Lantus  12u at bedtime and SSI while on steroids to maintain glucose 140-180  - Eliquis  5 mg BID - Pain regimen: acetaminophen  1000 mg q6h, prn oxycodone  5 mg q4h - Nausea regimen: Zofran  4 mg PRN - Continuous cardiac monitoring - Neuro checks Q4H - PT/OT following - SLP following - Fall and seizure precautions - AM Labs: CBC

## 2024-07-28 NOTE — Assessment & Plan Note (Signed)
 Rate controlled. Initially anticoagulated with heparin  following discussion with family on 9/15, now on Eliquis . - Rate control: Coreg  12.5 mg twice daily - Anticoagulation: Eliquis  5 mg BID

## 2024-07-28 NOTE — Assessment & Plan Note (Addendum)
 As seen on photos from 9/14 AM. Satellite lesions suggestive of fungal etiology. Likely from prolonged immobility and moisture from diarrhea. Anticipate improvement with resolution of diarrhea. - Clotrimazole  cream twice daily - Gerhardt's butt cream TID  - Desitin 40% ointment  - Imodium  4mg  prn - Consider rectal tube if diarrhea continues - Turn patient every 2 hours

## 2024-07-28 NOTE — Progress Notes (Addendum)
 Daily Progress Note Intern Pager: [phone]  Patient name: Wayne Molina Medical record number: [id number] Date of birth: 05-09-1956 Age: 68 y.o. Gender: male  Primary Care Provider: Janna Ferrier, DO Consultants: Neurosurgery, neurology, oncology, palliative Code Status: DNR-limited  Pt Overview and Major Events to Date:  8/29-admitted, focal seizure in ED, intubated, to the ICU 9/4-extubated 9/8-neurosurgery completed open craniotomy for tumor biopsy and confirmed glioblastoma 9/10-care resumed from ICU 9/15-started on heparin  9/18-switched to Eliquis   Assessment and Plan:  Wayne Molina is a 68 year old male admitted for seizure and unfortunately found to have glioblastoma multiforme.  Patient continues to work on his p.o. and strength.  Planning for care team meeting with family on Tuesday 9/23 in the morning. 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 - Neurology consulted, recs appreciated - Neurosurgery consulted, appreciate recommendations  - Palliative care consulted, appreciate assistance in patient's care - Oncology consulted, appreciate recommendations -Repeat stat CT head if there is acute change in mental status or new neurological deficits - Keppra  1000 mg BID - Versed  prn for clinical seizures - IV Dexamethasone  2 mg Q8H (8am, 1pm, and 6pm along with other nightly medications at 6pm) - Taper per neurology: reduce to 2 mg q12h after 2 weeks (on 10/1), then after 2 weeks reduce to 2mg  once daily (on 10/15) - Glucose regimen: Lantus  12u at bedtime and SSI while on steroids to maintain glucose 140-180  - Eliquis  5 mg BID - Pain regimen: acetaminophen  1000 mg q6h, prn oxycodone  5 mg q4h - Nausea regimen: Zofran  4 mg PRN - Continuous cardiac monitoring - Neuro checks Q4H - PT/OT following - SLP following - Fall and seizure precautions - AM Labs: CBC Malnutrition of moderate  degree Smallbore feeding tube (Cortrak) left nare.  No percent meals recorded last 24 hours, nor p.o. intake recorded-will message nursing. -RD consulted, appreciate recs - Tube feeding via Cortrak: [name] 1.4 @ 63 mL/hr x 16 hours (1000 mL/day) - PROSource TF20 60 mL 1x/d - Fiber supplement (Banatrol TF) liquid 60 mL BID  - Mupirocin  ointment 2% twice daily - Protonix  40 mg daily Wound of gluteal cleft Irritant contact dermatitis due to fecal incontinence Suspected fungal etiology. - Clotrimazole  cream twice daily - Gerhardt's butt cream TID  - Desitin 40% ointment  - Imodium  4mg  prn - Consider rectal tube if diarrhea continues - Turn patient every 2 hours Atrial fibrillation with RVR (HCC) HR in goal today. - Coreg  12.5 mg twice daily - Eliquis  5 mg BID Thrombosis/embolism, venous Right gastrocnemius veins. No swelling of lower extremity, no pain. - Anticoagulation as above Sundowning - Seroquel  25 mg at 6pm; additional Seroquel  12.5 mg at 8pm - Melatonin 3 mg at 6pm Chronic health problem Urinary retention - Foley in place (failed void trail); on Flomax  0.4mg  HTN -normotensive this a.m. Continue home Coreg  12.5 mg BID. Holding home Losartan . Discontinued hydralazine  9/12. Systolic goal <160.  HLD - Continue home Atorvastatin  40mg  daily  FEN/GI: Dysphagia 3 PPx: Eliquis   Dispo: Pending improved p.o. intake, also pending goals of care team discussion with palliative/oncology/neurology this upcoming Tuesday  Subjective:  Patient resting, easily awakes.  Denies any pain today, no acute concerns.  Patient speech is more clear and voice louder today.  Objective: Temp:  [97.8 F (36.6 C)-98.7 F (37.1 C)] 97.8 F (36.6 C) (09/19 1938) Pulse Rate:  [73-91] 87 (09/19 1938) Resp:  [16-18] 18 (09/19 1938) BP: (109-142)/(70-99)  109/70 (09/19 1938) SpO2:  [96 %-100 %] 100 % (09/19 1938) Weight:  [77 kg] 77 kg (09/19 0314) Physical Exam: General: NAD, resting in  bed Cardiovascular: Irregularly irregular Respiratory: Normal work of breathing on room air, CTAB Abdomen: Soft, nontender, nondistended Extremities: No bilateral lower extremity edema   Laboratory: Most recent CBC Lab Results  Component Value Date   WBC 14.6 (H) 07/28/2024   HGB 15.4 07/28/2024   HCT 45.6 07/28/2024   MCV 90.3 07/28/2024   PLT 611 (H) 07/28/2024   Most recent BMP    Latest Ref Rng & Units 07/28/2024    2:45 AM  BMP  Glucose 70 - 99 mg/dL 875   BUN 8 - 23 mg/dL 31   Creatinine 9.38 - 1.24 mg/dL 9.34   Sodium 864 - 854 mmol/L 137   Potassium 3.5 - 5.1 mmol/L 4.6   Chloride 98 - 111 mmol/L 101   CO2 22 - 32 mmol/L 22   Calcium  8.9 - 10.3 mg/dL 8.8    Howell Lunger, DO 07/28/2024, 9:30 PM  PGY-3, [facility] FPTS Intern pager: [phone], text pages welcome Secure chat group [facility] Teaching Service

## 2024-07-28 NOTE — Progress Notes (Signed)
 Speech Language Pathology Treatment: Dysphagia;Cognitive-Linguistic  Patient Details Name: Wayne Molina MRN: [id number] DOB: 05-02-1956 Today's Date: 07/28/2024 Time: 8996-8973 SLP Time Calculation (min) (ACUTE ONLY): 23 min  Assessment / Plan / Recommendation Clinical Impression  Pt sitting up in chair with wife and bedside. Breakfast tray untouched. Wife reported pt was hungry, though breakfast interrupted by PT session; however, pt declined all PO trials offered including reheated breakfast. Encouragement for PO intake continuously offered. Wife endorsed that he drank a whole Ensure. Reminded pt and wife that Ensure is a great supplement, though does not replace the nutrients from whole foods.   SLP address both speech/cognitive and dysphagia goals this session. Pt recalled some of PT therapy activities with min cues. He required moderate cues and redirection throughout conversation to re-attend to topic and directly answer questions. Continued to encourage adequate breath support to project voice to aid intelligibility. Pt consumed sips of Ensure during session with increased wet vocal quality noted. He continues to report intermittent globus sensation with meats. SLP revisited option to complete MBSS and with education, pt was agreeable to schedule MBSS for Monday, 9/22.  He declined to complete MBSS today, despite availability.   Continue current diet as tolerated. MBSS anticipated on Monday, 9/22, with pt request to complete in the afternoon if able.    HPI HPI: Patient is a 68 y.o. man who presented to [facility] 8/28, with AMS, Code Stroke. PMHx significant for HTN, HLD, Afib on Eliquis , untreated OSA. Patient presented to ER as a Code Stroke after coworker noticed he was altered since 8/28 around 2230 and pale, patient reported feeling dizzy. In ER, noted to have L sided weakness and left hemianopsia. CT Head initially concerning for right posterior MCA stroke but CTA negative for LVO. Repeat CT  Head concerning for mass. Course complicated by seizure episode. Intubated 8/29, extubated 9/5 but was still with difficulty managing secreations. Per RN 9/18, pt with significant improvement.  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 Patient was intubated for open craniotomy for biopsy (right head) on 09/08. He has been maintained on cortrak tube feeding and some po intake.      SLP Plan  Continue with current plan of care;MBS          Recommendations  Diet recommendations: Dysphagia 3 (mechanical soft);Thin liquid Liquids provided via: Cup;Straw Medication Administration:  (as tolerated) Compensations: Minimize environmental distractions;Slow rate;Small sips/bites Postural Changes and/or Swallow Maneuvers: Seated upright 90 degrees;Upright 30-60 min after meal                  Oral care BID   Frequent or constant Supervision/Assistance Cognitive communication deficit (R41.841);Dysphagia, unspecified (R13.10)     Continue with current plan of care;MBS     Wayne Molina  07/28/2024, 10:40 AM

## 2024-07-28 NOTE — Progress Notes (Signed)
 Physical Therapy Treatment Patient Details Name: Wayne Molina MRN: [id number] DOB: [birth year] Today's Date: 07/28/2024   History of Present Illness Pt is a 68 y/o male presenting on 8/28 with L arm weakness, L hemianopsia, and slurred speech. 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 Course complicated by seizure episode. Intubated 8/29, extubated 9/4 but still with difficulty managing secretions.  Biopsy on 9/8. +DVT R LE 9/14.  PMH includes: anxiety, HLD, HTN, sleep apnea, chronic afib.   PT Comments  Pt making steady progress towards acute PT goals. Pt improved by needing less assistance for bed mobility and to maintain seated balance. He had good initiation throughout the session and good carryover from prior sessions with practiced tasks. Able to maintain seated balance with CGA for safety and no UE support. Worked on multiple sit<>stand repetitions and pre-gait training with MaxA and RW. Continue to recommend >3hrs post acute rehab with acute PT to follow.    If plan is discharge home, recommend the following: Two people to help with walking and/or transfers;Two people to help with bathing/dressing/bathroom;Assistance with cooking/housework;Assistance with feeding;Direct supervision/assist for medications management;Assist for transportation;Direct supervision/assist for financial management;Supervision due to cognitive status   Can travel by private vehicle      No  Equipment Recommendations  Wheelchair (measurements PT);Wheelchair cushion (measurements PT);Hospital bed;Hoyer lift       Precautions / Restrictions Precautions Precautions: Fall;Other (comment) Recall of Precautions/Restrictions: Impaired Precaution/Restrictions Comments: cortrak, watch vitals, SBP <160 Restrictions Weight Bearing Restrictions Per Provider Order: No     Mobility  Bed Mobility Overal bed mobility: Needs  Assistance Bed Mobility: Rolling, Sidelying to Sit Rolling: Min assist Sidelying to sit: HOB elevated, Used rails, Min assist    General bed mobility comments: MinA to complete moving LE's off EOB with slight assist to raise trunk. Good carryover from prior sessions with pt reaching for rail and pushing without cues    Transfers Overall transfer level: Needs assistance Equipment used: Rolling walker (2 wheels) Transfers: Sit to/from Stand, Bed to chair/wheelchair/BSC Sit to Stand: Max assist   Step pivot transfers: Max assist, +2 physical assistance, +2 safety/equipment    General transfer comment: MaxA to power-up and for steadying assist. Flexed at hips and knees with cues for upright posture. Performed step-pivot with MaxAx2    Ambulation/Gait  Pre-gait activities: x10 bilateral weight-shifts, x10 standing marches with limited foot clearance      Modified Rankin (Stroke Patients Only) Modified Rankin (Stroke Patients Only) Pre-Morbid Rankin Score: No symptoms Modified Rankin: Severe disability     Balance Overall balance assessment: Needs assistance Sitting-balance support: Feet supported, Bilateral upper extremity supported Sitting balance-Leahy Scale: Fair Sitting balance - Comments: CGA for safety with no leaning observed   Standing balance support: Bilateral upper extremity supported, Reliant on assistive device for balance, During functional activity Standing balance-Leahy Scale: Poor Standing balance comment: reliant on RW     Communication Communication Communication: Impaired Factors Affecting Communication: Reduced clarity of speech (soft voice)  Cognition Arousal: Alert Behavior During Therapy: Flat affect   PT - Cognitive impairments: Sequencing, Problem solving, Safety/Judgement    PT - Cognition Comments: Improvements noted in initiation with step by step sequencing. Would only communicate if asked direct questions Following commands:  Impaired Following commands impaired: Follows one step commands with increased time, Follows multi-step commands inconsistently    Cueing Cueing Techniques: Verbal cues, Tactile cues, Visual cues  Exercises General  Exercises - Lower Extremity Long Arc Quad: AROM, Both, 10 reps, Seated (x5 sec hold) Straight Leg Raises: AROM, Both, 5 reps, Supine Hip Flexion/Marching: AROM, Both, 10 reps, Seated (x5 sec hold) Other Exercises Other Exercises: x3 sit<>stand with MaxAx2 and RW    General Comments General comments (skin integrity, edema, etc.): Wife present and supportive during session      Pertinent Vitals/Pain Pain Assessment Pain Assessment: No/denies pain Pain Intervention(s): Monitored during session     PT Goals (current goals can now be found in the care plan section) Acute Rehab PT Goals PT Goal Formulation: With patient/family Time For Goal Achievement: 08/11/24 Potential to Achieve Goals: Good Progress towards PT goals: Progressing toward goals    Frequency    Min 3X/week       AM-PAC PT 6 Clicks Mobility   Outcome Measure  Help needed turning from your back to your side while in a flat bed without using bedrails?: A Lot Help needed moving from lying on your back to sitting on the side of a flat bed without using bedrails?: A Lot Help needed moving to and from a bed to a chair (including a wheelchair)?: Total Help needed standing up from a chair using your arms (e.g., wheelchair or bedside chair)?: A Lot Help needed to walk in hospital room?: Total Help needed climbing 3-5 steps with a railing? : Total 6 Click Score: 9    End of Session Equipment Utilized During Treatment: Gait belt Activity Tolerance: Patient tolerated treatment well Patient left: in chair;with call bell/phone within reach;with chair alarm set Nurse Communication: Mobility status PT Visit Diagnosis: Unsteadiness on feet (R26.81);Other abnormalities of gait and mobility (R26.89);Muscle  weakness (generalized) (M62.81);Difficulty in walking, not elsewhere classified (R26.2);Other symptoms and signs involving the nervous system (R29.898)     Time: 9178-9151 PT Time Calculation (min) (ACUTE ONLY): 27 min  Charges:    $Therapeutic Exercise: 8-22 mins $Therapeutic Activity: 8-22 mins PT General Charges $$ ACUTE PT VISIT: 1 Visit                    Kate ORN, PT, DPT Secure Chat Preferred  Rehab Office [phone]   Kate BRAVO [name] 07/28/2024, 9:07 AM

## 2024-07-28 NOTE — Evaluation (Signed)
 Modified Barium Swallow Study  Patient Details  Name: Wayne Molina MRN: [id number] Date of Birth: 1956/06/28  Today's Date: 07/28/2024  Modified Barium Swallow completed.  Full report located under Chart Review in the Imaging Section.  History of Present Illness Wayne Molina is a 68 y.o. man who presented to [facility] 8/28 with AMS. MRI large infiltrative tumor with heterogeneous enhancement tracking from the splenium of corpus callosum near midline into right temporal lobe. Course complicated by seizure; intubated 8/29-9/5.  Underwent craniotomy and biopsy 9/8 with result of glioblastoma multiforme stage IV. Has cortrak; on dysphagia 3/thin liquids. PMHx significant for HTN, HLD, Afib on Eliquis , untreated OSA.   Clinical Impression Pt presented with a moderate oropharyngeal dysphagia with a total DIGEST score of  2. Safety grade: 2 (intermittent, not gross aspiration) and efficiency grade: 1 (less than half residue).  There was decreased oral propulsion and reduced tongue base contraction against pharyngeal wall, leading to residue in the valleculae as well as inconsistent epiglottic inversion over the larynx.  Larger, weightier volumes helped with epiglottic closure.  Aspiration of thin and nectars occurred as trace amounts of material spilled through gap between epiglottis and arytenoids, settling on the on vocal folds and ultimately passing into the trachea.  Pt had poor sensation and did not spontaneously cough in response to aspiration.  As study progressed and pt had more opportunities to swallow, airway protection improved and he had fewer incidents of aspiration by the end of the study.    Spoke with pt and his wife after the MBS - he has been on oral diet of dysphagia 3/thin liquids for ten days without adverse consequences, no pna.  Aspiration is inconsistent and swallow function actually improved as he swallowed more.  Given these factors, we decided pt should remain on PO diet.  He should  continue to sit upright for PO intake and intentionally cough intermittently while eating meals.  SLP will follow closely.  D/W RN. Factors that may increase risk of adverse event in presence of aspiration Noe & Lianne 2021): Limited mobility  Swallow Evaluation Recommendations Recommendations: PO diet PO Diet Recommendation: Dysphagia 3 (Mechanical soft);Thin liquids (Level 0) Liquid Administration via: Cup;Straw Medication Administration: Via alternative means Supervision: Full assist for feeding Swallowing strategies  : Clear throat intermittently;Hard cough after swallowing Postural changes: Position pt fully upright for meals Oral care recommendations: Oral care BID (2x/day)      Vona Palma Laurice 07/28/2024,3:35 PM   Jaimie Pippins L. Vona, MA CCC/SLP Clinical Specialist - Acute Care SLP Acute Rehabilitation Services Office number [phone]

## 2024-07-28 NOTE — Progress Notes (Addendum)
 Calorie Count Note  48-hour calorie count ordered.  Limited tickets available to review this AM. However, was able to record what was taken in at breakfast as tray was at bedside and wife present able to recall what pt had for dinner last night. Reports that he did not take in much ensure yesterday but they also were not brought into the room, had to ask for the supplement that was offered.   Yesterday TF volume and infusion time was adjusted to meet ~75% of pt's needs which correlated with the increase in PO intake that was reported by RN between breakfast 9/18 and dinner 9/17. Kcal count was started to be able to gain consistent data on intake and adjust enteral feeds based on pt's intake. Stools also improving after change in formula.   TF rate was adjusted yesterday afternoon by attending to 68mL/h x 16 hours which provided 400mL of formula overnight. Would not recommend this rate be continued as it provides significantly less than pt needs with his current PO intake.   Discussed kcal count with RN and wife at bedside. Wife document all outside food that is brought in as well as supplements. Will also place meal tickets in envelopes if she is present. RN aware of plan.  As patient will be at the hospital through the weekend (family meeting planned for Tuesday) will increase rate to account for the amount of nutrition that patient needs to take in to facilitate building strength and functional status back up.    Diet: DYS3/thin Supplements: Ensure Plus High Protein QID  Estimated Nutritional Needs:  Kcal:  1800-2000 Protein:  95-110g Fluid:  >/=1.8L  9/18 Dinner: 181 kcal, 12g protein 9/19 Breakfast: 253 kcal, 6g protein  Supplements: None yesterday per wife  Total intake x 2 meals: 434 kcal (24% of minimum estimated needs)  18 protein (19% of minimum estimated needs)  NUTRITION DIAGNOSIS:  Moderate Malnutrition related to chronic illness as evidenced by moderate fat depletion,  severe muscle depletion. - remains applicable   GOAL:  Patient will meet greater than or equal to 90% of their needs - progressing   INTERVENTION:  Continue current diet as ordered Encourage PO intake Family to assist with ordering meals Ensure Plus High Protein po QID, each supplement provides 350 kcal and 20 grams of protein, pt prefers chocolate Continue tube feeding via Cortrak. Change to the following: Mallie Pinion 1.4 @ 65 mL/hr x 14 hours (910 mL/day) PROSource TF20 60 mL 1x/d free water  q4 hours Tube feeding regimen provides 1354 kcal, 76 grams of protein, and 655 ml of H2O. ( free water  TF+flush) This regimen meets ~ 70% of pt's estimated needs Banatrol BID to aid in thickening stools Continue Kcal count through the weekend    Vernell Lukes, RD, LDN, CNSC Registered Dietitian II Please reach out via secure chat

## 2024-07-28 NOTE — Assessment & Plan Note (Addendum)
-   Seroquel  25 mg at 6pm; additional Seroquel  12.5 mg at 8pm - Melatonin 3 mg at 6pm

## 2024-07-28 NOTE — Progress Notes (Signed)
 Daily Progress Note Intern Pager: [phone]  Patient name: Wayne Molina Medical record number: [id number] Date of birth: 09-Oct-1956 Age: 68 y.o. Gender: male  Primary Care Provider: Janna Ferrier, DO Consultants: Neurosurgery, neurology, oncology, palliative Code Status: DNR-Limited  Pt Overview and Major Events to Date:  8/29-admitted, focal seizure in ED, intubated, to the ICU 9/4-extubated 9/8-neurosurgery completed open craniotomy for tumor biopsy and confirmed glioblastoma 9/10-care resumed from ICU 9/15-started on heparin  9/18-switched to Eliquis   Medical Decision Making:  Wayne Molina is a 68 y.o. male with PMH significant for A fib, OSA, osteoporosis, HTN, HLD admitted with seizure found to be 2/2 glioblastoma multiforme. Patient continuing to work on improved po, strength.  Planning for care team meeting with family on Tues 9/23 morning.  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 - Neurology consulted, recs appreciated - If any acute change in mental status or neuro neurological deficits, will need stat CT head - Neurosurgery consulted, appreciate recommendations  - Palliative care consulted, appreciate assistance in patient's care - Oncology consulted, appreciate recommendations - Keppra  1000 mg BID - Versed  prn for clinical seizures - IV Dexamethasone  2 mg Q8H (8am, 1pm, and 6pm along with other nightly medications at 6pm) - Taper per neurology: reduce to 2 mg q12h after 2 weeks (on 10/1), then after 2 weeks reduce to 2mg  once daily (on 10/15) - Sugar regimen: Lantus  12u at bedtime and SSI while on steroids to maintain glucose 140-180  - Anticoagulated with Eliquis  5 mg BID; stable Hgb following transition of 15.4. - Pain regimen: acetaminophen  1000 mg q6h, prn oxycodone  5 mg q4h - Nausea regimen: Zofran  4 mg PRN - Continuous cardiac monitoring - Neuro checks Q4H - PT/OT following - SLP following - Fall and  seizure precautions - AM Labs: CBC Malnutrition of moderate degree Smallbore feeding tube (Cortrak) left nare. Continued improved PO intake, reducing amount of feed via tube to encourage po per RD. Continue to follow goals of care discussions with palliative. RD continues to follow. - Tube feeding via Cortrak: Wayne Molina 1.4 @ 63 mL/hr x 16 hours (1000 mL/day) - PROSource TF20 60 mL 1x/d - Fiber supplement (Banatrol TF) liquid 60 mL BID  - Mupirocin  ointment 2% twice daily - Protonix  40 mg daily - Appreciate nutrition, palliative recs Wound of gluteal cleft Irritant contact dermatitis due to fecal incontinence As seen on photos from 9/14 AM. Satellite lesions suggestive of fungal etiology. Likely from prolonged immobility and moisture from diarrhea. Anticipate improvement with resolution of diarrhea. - Clotrimazole  cream twice daily - Gerhardt's butt cream TID  - Desitin 40% ointment  - Imodium  4mg  prn - Consider rectal tube if diarrhea continues - Turn patient every 2 hours Atrial fibrillation with RVR (HCC) Rate controlled. Initially anticoagulated with heparin  following discussion with family on 9/15, now on Eliquis . - Rate control: Coreg  12.5 mg twice daily - Anticoagulation: Eliquis  5 mg BID Thrombosis/embolism, venous DVT US  on 9/14 suggestive of acute intramuscular thrombosis involving the right gastrocnemius veins. - Anticoagulation as above Sundowning - Seroquel  25 mg at 6pm; additional Seroquel  12.5 mg at 8pm if patient not resting [per neurology] - Melatonin 3 mg at 6pm Chronic health problem Urinary retention - Foley in place (failed void trail); on Flomax  0.4mg  HTN - BP today in the 150s/90s. Continue home Coreg  12.5 mg BID. Holding home Losartan . Discontinued hydralazine  9/12. Systolic goal <160.  HLD - Continue home Atorvastatin  40mg  daily  FEN/GI: Dys 3 PPx: Eliquis  5 mg BID Dispo: Pending improved po intake versus other nutrition management; also pending  continued care team discussions with palliative/oncology/neurology  Subjective:  Patient reports he is doing well overall.  Still has some pain of his buttocks, but reports no other concerns.  He is more talkative today with a somewhat louder voice and was seen while he was eating breakfast.  Wife also present throughout discussion, and reports diarrhea has improved.  He was also not as agitated last night as he had been previous nights.  Objective: Temp:  [97.7 F (36.5 C)-98.6 F (37 C)] 98.6 F (37 C) (09/19 1148) Pulse Rate:  [73-91] 90 (09/19 1148) Resp:  [16-18] 18 (09/19 1148) BP: (114-142)/(75-99) 114/75 (09/19 1148) SpO2:  [94 %-99 %] 99 % (09/19 1148) Weight:  [77 kg] 77 kg (09/19 0314) Physical Exam: General: Patient is seated in recliner eating breakfast, no acute distress. Cardiovascular: Irregularly irregular, no murmurs/rubs/gallops. Respiratory: Normal work of breathing on room air. Clear to auscultation bilaterally; no wheezes, crackles. Abdomen: Bowel sounds present and normoactive bilaterally. Soft, nondistended, nontender. Extremities: Skin hold, dry. No bilateral lower extremity edema.  No pain to palpation of bilateral calves.  Laboratory: Most recent CBC Lab Results  Component Value Date   WBC 14.6 (H) 07/28/2024   HGB 15.4 07/28/2024   HCT 45.6 07/28/2024   MCV 90.3 07/28/2024   PLT 611 (H) 07/28/2024   Most recent BMP    Latest Ref Rng & Units 07/28/2024    2:45 AM  BMP  Glucose 70 - 99 mg/dL 875   BUN 8 - 23 mg/dL 31   Creatinine 9.38 - 1.24 mg/dL 9.34   Sodium 864 - 854 mmol/L 137   Potassium 3.5 - 5.1 mmol/L 4.6   Chloride 98 - 111 mmol/L 101   CO2 22 - 32 mmol/L 22   Calcium  8.9 - 10.3 mg/dL 8.8     Larraine Palma, MD 07/28/2024, 1:15 PM  PGY-1, [facility] Family Medicine FPTS Intern pager: [phone], text pages welcome Secure chat group [facility] Teaching Service

## 2024-07-28 NOTE — Assessment & Plan Note (Addendum)
 Smallbore feeding tube (Cortrak) left nare. Continued improved PO intake, reducing amount of feed via tube to encourage po per RD. Continue to follow goals of care discussions with palliative. RD continues to follow. - Tube feeding via Cortrak: [name] 1.4 @ 63 mL/hr x 16 hours (1000 mL/day) - PROSource TF20 60 mL 1x/d - Fiber supplement (Banatrol TF) liquid 60 mL BID  - Mupirocin  ointment 2% twice daily - Protonix  40 mg daily - Appreciate nutrition, palliative recs

## 2024-07-28 NOTE — Assessment & Plan Note (Signed)
 DVT US  on 9/14 suggestive of acute intramuscular thrombosis involving the right gastrocnemius veins. - Anticoagulation as above

## 2024-07-28 NOTE — TOC Progression Note (Signed)
 Transition of Care [facility]) - Progression Note    Patient Details  Name: Wayne Molina MRN: [id number] Date of Birth: 1956-09-13  [facility] of [facility]) CM/SW Contact  Andrez JULIANNA George, RN Phone Number: 07/28/2024, 3:51 PM  Clinical Narrative:     Scheduled family meeting for Tuesday 9/23 at 10am  CIR following for d/c plan.  IP Care management following.  Expected Discharge Plan: IP Rehab Facility Barriers to Discharge: Continued Medical Work up               Expected Discharge Plan and Services       Living arrangements for the past 2 months: Single Family Home                                       Social Drivers of Health (SDOH) Interventions SDOH Screenings   Food Insecurity: No Food Insecurity (03/22/2024)  Housing: Low Risk  (03/22/2024)  Transportation Needs: No Transportation Needs (03/22/2024)  Utilities: Not At Risk (04/16/2023)  Alcohol Screen: Low Risk  (04/16/2023)  Depression (PHQ2-9): Low Risk  (03/23/2024)  Financial Resource Strain: Low Risk  (03/22/2024)  Physical Activity: Sufficiently Active (03/22/2024)  Social Connections: Unknown (03/22/2024)  Stress: No Stress Concern Present (03/22/2024)  Tobacco Use: Medium Risk (07/07/2024)    Readmission Risk Interventions     No data to display

## 2024-07-28 NOTE — Assessment & Plan Note (Addendum)
 Urinary retention - Foley in place (failed void trail); on Flomax  0.4mg  HTN - BP today in the 150s/90s. Continue home Coreg  12.5 mg BID. Holding home Losartan . Discontinued hydralazine  9/12. Systolic goal <160.  HLD - Continue home Atorvastatin  40mg  daily

## 2024-07-28 NOTE — Assessment & Plan Note (Addendum)
-   Neurology consulted, recs appreciated - If any acute change in mental status or neuro neurological deficits, will need stat CT head - Neurosurgery consulted, appreciate recommendations  - Palliative care consulted, appreciate assistance in patient's care - Oncology consulted, appreciate recommendations - Keppra  1000 mg BID - Versed  prn for clinical seizures - IV Dexamethasone  2 mg Q8H (8am, 1pm, and 6pm along with other nightly medications at 6pm) - Taper per neurology: reduce to 2 mg q12h after 2 weeks (on 10/1), then after 2 weeks reduce to 2mg  once daily (on 10/15) - Sugar regimen: Lantus  12u at bedtime and SSI while on steroids to maintain glucose 140-180  - Anticoagulated with Eliquis  5 mg BID; stable Hgb following transition of 15.4. - Pain regimen: acetaminophen  1000 mg q6h, prn oxycodone  5 mg q4h - Nausea regimen: Zofran  4 mg PRN - Continuous cardiac monitoring - Neuro checks Q4H - PT/OT following - SLP following - Fall and seizure precautions - AM Labs: CBC

## 2024-07-28 NOTE — Assessment & Plan Note (Signed)
-   Seroquel  25 mg at 6pm; additional Seroquel  12.5 mg at 8pm if patient not resting [per neurology] - Melatonin 3 mg at 6pm

## 2024-07-28 NOTE — Progress Notes (Addendum)
 Daily Progress Note   Patient Name: Wayne Molina       Date: 07/28/2024 DOB: 02/03/1956  Age: 68 y.o. MRN#: [id number] Attending Physician: McDiarmid, Krystal BIRCH, MD Primary Care Physician: Janna Ferrier, DO Admit Date: 07/06/2024  Reason for Consultation/Follow-up: Establishing goals of care  Subjective: Medical records reviewed including progress notes, labs and imaging. Patient assessed at the bedside. He is sitting on commode with sitz bath. His wife is present visiting.  Created space and opportunity for patient and family's thoughts and feelings on patient's current illness. They are glad that his skin irritation and diarrhea are being addressed for symptom management. They confirmed their understanding of a plan to hold multidisciplinary meeting on Tuesday 9/23 to include primary team, palliative, oncology and neurology. Scheduled for 10am. No other needs or concerns at this time.  Questions and concerns addressed. PMT will continue to support holistically.   Length of Stay: 21   Physical Exam Vitals and nursing note reviewed.  Constitutional:      General: He is not in acute distress.    Appearance: He is ill-appearing.     Interventions: Nasal cannula in place.     Comments: Cortrak in place  Cardiovascular:     Rate and Rhythm: Normal rate.  Pulmonary:     Effort: Pulmonary effort is normal.  Neurological:     Mental Status: He is lethargic.  Psychiatric:        Speech: Speech is delayed.             Vital Signs: BP (!) 142/99 (BP Location: Right Arm)   Pulse 73   Temp 98.2 F (36.8 C) (Oral)   Resp 18   Ht 5' 9 (1.753 m)   Wt 77 kg   SpO2 98%   BMI 25.07 kg/m  SpO2: SpO2: 98 % O2 Device: O2 Device: Room Air O2 Flow Rate: O2 Flow Rate (L/min): 5 L/min      Palliative Assessment/Data: TBD   Palliative Care  Assessment & Plan   Patient Profile: 68 y.o. male  with past medical history of atrial fibrillation, depression/anxiety, hypertension, hyperlipidemia, atypical variant hypertrophic cardiomyopathy, OSA, osteoporosis admitted on 07/06/2024 with weakness of left arm and slurred speech.    I nitially brought in as code stroke in the emergency room, CTA negative for large vessel occlusion and suggestive of possible right MCA stroke. Repeat head CT was obtained in the ED due to AMS and eye twitching, suggestive of mass. He then received lorazepam  for seizure-like activity, subsequently intubated for airway protection and transferred to the intensive care unit. Patient underwent biopsy of  mass 9/8 with result of glioblastoma multiforme WHO grade 4. Hospitalization further complicated by fevers, RLE DVT, HAP, afib/RVR, malnutrition requiring cortrak placement, sundowning.    PMT has been consulted to assist with goals of care conversation.  Assessment: Goals of care conversation Seizure secondary to brain mass GBM HAP Acute DVT RLE  Recommendations/Plan: Continue DNR/DNI Continue full scope otherwise Scheduled family meeting for Tuesday 9/23 at 10am Psychosocial and emotional support provided PMT will continue to follow   Prognosis:  Poor with highly aggressive BGM and several acute complications   Discharge Planning: To Be Determined  Care plan was discussed with patient, patient's wife, primary team/neuro/oncology via secure chat   Ming Mcmannis SHAUNNA Fell, PA-C  Palliative Medicine Team Team phone # [phone]  Thank you for allowing the Palliative Medicine Team to assist in the care of this patient. Please utilize secure chat with additional questions, if there is no response within 30 minutes please call the above phone number.  Palliative Medicine Team providers are available by phone from 7am to 7pm daily and can be reached through the team cell phone.  Should this patient require  assistance outside of these hours, please call the patient's attending physician.     Time Total: 35  Visit consisted of counseling and education dealing with the complex and emotionally intense issues of symptom management and palliative care in the setting of serious and potentially life-threatening illness. Greater than 50% of this time was spent counseling and coordinating care related to the above assessment and plan.  Personally spent 35 minutes in patient care including extensive chart review (labs, imaging, progress/consult notes, vital signs), medically appropraite exam, discussed with treatment team, education to patient, family, and staff, documenting clinical information, medication review and management, coordination of care, and available advanced directive documents.

## 2024-07-29 DIAGNOSIS — C719 Malignant neoplasm of brain, unspecified: Secondary | ICD-10-CM | POA: Diagnosis not present

## 2024-07-29 DIAGNOSIS — L24A2 Irritant contact dermatitis due to fecal, urinary or dual incontinence: Secondary | ICD-10-CM | POA: Diagnosis not present

## 2024-07-29 DIAGNOSIS — G9340 Encephalopathy, unspecified: Secondary | ICD-10-CM | POA: Diagnosis not present

## 2024-07-29 DIAGNOSIS — E440 Moderate protein-calorie malnutrition: Secondary | ICD-10-CM | POA: Diagnosis not present

## 2024-07-29 LAB — CBC
HCT: 47 % (ref 39.0–52.0)
Hemoglobin: 15.9 g/dL (ref 13.0–17.0)
MCH: 30.8 pg (ref 26.0–34.0)
MCHC: 33.8 g/dL (ref 30.0–36.0)
MCV: 90.9 fL (ref 80.0–100.0)
Platelets: 602 K/uL — ABNORMAL HIGH (ref 150–400)
RBC: 5.17 MIL/uL (ref 4.22–5.81)
RDW: 12.7 % (ref 11.5–15.5)
WBC: 14 K/uL — ABNORMAL HIGH (ref 4.0–10.5)
nRBC: 0 % (ref 0.0–0.2)

## 2024-07-29 LAB — GLUCOSE, CAPILLARY
Glucose-Capillary: 176 mg/dL — ABNORMAL HIGH (ref 70–99)
Glucose-Capillary: 156 mg/dL — ABNORMAL HIGH (ref 70–99)
Glucose-Capillary: 164 mg/dL — ABNORMAL HIGH (ref 70–99)
Glucose-Capillary: 223 mg/dL — ABNORMAL HIGH (ref 70–99)
Glucose-Capillary: 221 mg/dL — ABNORMAL HIGH (ref 70–99)

## 2024-07-29 MED ORDER — INSULIN GLARGINE 100 UNIT/ML ~~LOC~~ SOLN
16.0000 [IU] | Freq: Every day | SUBCUTANEOUS | Status: DC
  Administered 2024-07-29 – 2024-07-30 (×2): 16 [IU] via SUBCUTANEOUS
  Filled 2024-07-29 (×3): qty 0.16

## 2024-07-29 NOTE — Plan of Care (Signed)
  Problem: Education: Goal: Knowledge of General Education information will improve Description: Including pain rating scale, medication(s)/side effects and non-pharmacologic comfort measures Outcome: Progressing   Problem: Health Behavior/Discharge Planning: Goal: Ability to manage health-related needs will improve Outcome: Progressing   Problem: Clinical Measurements: Goal: Ability to maintain clinical measurements within normal limits will improve Outcome: Progressing Goal: Will remain free from infection Outcome: Progressing Goal: Diagnostic test results will improve Outcome: Progressing Goal: Respiratory complications will improve Outcome: Progressing   Problem: Activity: Goal: Risk for activity intolerance will decrease Outcome: Progressing   Problem: Nutrition: Goal: Adequate nutrition will be maintained Outcome: Progressing   Problem: Elimination: Goal: Will not experience complications related to bowel motility Outcome: Progressing Goal: Will not experience complications related to urinary retention Outcome: Progressing   Problem: Pain Managment: Goal: General experience of comfort will improve and/or be controlled Outcome: Progressing   Problem: Education: Goal: Ability to describe self-care measures that may prevent or decrease complications (Diabetes Survival Skills Education) will improve Outcome: Progressing Goal: Individualized Educational Video(s) Outcome: Progressing

## 2024-07-30 DIAGNOSIS — C719 Malignant neoplasm of brain, unspecified: Secondary | ICD-10-CM | POA: Diagnosis not present

## 2024-07-30 DIAGNOSIS — L24A2 Irritant contact dermatitis due to fecal, urinary or dual incontinence: Secondary | ICD-10-CM | POA: Diagnosis not present

## 2024-07-30 DIAGNOSIS — G9340 Encephalopathy, unspecified: Secondary | ICD-10-CM | POA: Diagnosis not present

## 2024-07-30 LAB — MAGNESIUM
Magnesium: 2.2 mg/dL (ref 1.7–2.4)
Magnesium: 2.2 mg/dL (ref 1.7–2.4)

## 2024-07-30 LAB — GLUCOSE, CAPILLARY
Glucose-Capillary: 104 mg/dL — ABNORMAL HIGH (ref 70–99)
Glucose-Capillary: 108 mg/dL — ABNORMAL HIGH (ref 70–99)
Glucose-Capillary: 119 mg/dL — ABNORMAL HIGH (ref 70–99)
Glucose-Capillary: 156 mg/dL — ABNORMAL HIGH (ref 70–99)
Glucose-Capillary: 161 mg/dL — ABNORMAL HIGH (ref 70–99)
Glucose-Capillary: 216 mg/dL — ABNORMAL HIGH (ref 70–99)
Glucose-Capillary: 235 mg/dL — ABNORMAL HIGH (ref 70–99)

## 2024-07-30 LAB — BASIC METABOLIC PANEL WITH GFR
Anion gap: 9 (ref 5–15)
Anion gap: 8 (ref 5–15)
BUN: 32 mg/dL — ABNORMAL HIGH (ref 8–23)
BUN: 33 mg/dL — ABNORMAL HIGH (ref 8–23)
CO2: 28 mmol/L (ref 22–32)
CO2: 25 mmol/L (ref 22–32)
Calcium: 8.9 mg/dL (ref 8.9–10.3)
Calcium: 8.5 mg/dL — ABNORMAL LOW (ref 8.9–10.3)
Chloride: 98 mmol/L (ref 98–111)
Chloride: 101 mmol/L (ref 98–111)
Creatinine, Ser: 0.72 mg/dL (ref 0.61–1.24)
Creatinine, Ser: 0.78 mg/dL (ref 0.61–1.24)
GFR, Estimated: >60 mL/min (ref >60)
GFR, Estimated: >60 mL/min (ref >60)
Glucose, Bld: 101 mg/dL — ABNORMAL HIGH (ref 70–99)
Glucose, Bld: 102 mg/dL — ABNORMAL HIGH (ref 70–99)
Potassium: 4.7 mmol/L (ref 3.5–5.1)
Potassium: 4.8 mmol/L (ref 3.5–5.1)
Sodium: 135 mmol/L (ref 135–145)
Sodium: 134 mmol/L — ABNORMAL LOW (ref 135–145)

## 2024-07-30 LAB — CBC
HCT: 50.9 % (ref 39.0–52.0)
Hemoglobin: 17.4 g/dL — ABNORMAL HIGH (ref 13.0–17.0)
MCH: 30.8 pg (ref 26.0–34.0)
MCHC: 34.2 g/dL (ref 30.0–36.0)
MCV: 90.1 fL (ref 80.0–100.0)
Platelets: 571 K/uL — ABNORMAL HIGH (ref 150–400)
RBC: 5.65 MIL/uL (ref 4.22–5.81)
RDW: 13 % (ref 11.5–15.5)
WBC: 15 K/uL — ABNORMAL HIGH (ref 4.0–10.5)
nRBC: 0 % (ref 0.0–0.2)

## 2024-07-30 LAB — PHOSPHORUS: Phosphorus: 4.4 mg/dL (ref 2.5–4.6)

## 2024-07-30 NOTE — Assessment & Plan Note (Addendum)
 Remains without seizure. - Neurology consulted, recs appreciated - Neurosurgery consulted, appreciate recommendations  - Palliative care consulted, appreciate assistance in patient's care - Oncology consulted, appreciate recommendations -Repeat stat CT head if there is acute change in mental status or new neurological deficits - Keppra  1000 mg BID - Versed  prn for clinical seizures - IV Dexamethasone  2 mg Q8H (8am, 1pm, and 6pm along with other nightly medications at 6pm) - Taper per neurology: reduce to 2 mg q12h after 2 weeks (on 10/1), then after 2 weeks reduce to 2mg  once daily (on 10/15) - Glucose regimen: Lantus  16u at bedtime and SSI while on steroids to maintain glucose 140-180  - Eliquis  5 mg BID - Pain regimen: acetaminophen  1000 mg q6h, prn oxycodone  5 mg q4h - Nausea regimen: Zofran  4 mg PRN - Continuous cardiac monitoring - Neuro checks Q4H - PT/OT following - SLP following - Fall and seizure precautions - AM Labs: CBC

## 2024-07-30 NOTE — Assessment & Plan Note (Addendum)
 Smallbore feeding tube (Cortrak) left nare.  Appetite improving. -RD consulted, appreciate recs - Tube feeding via Cortrak: [name] 1.4 @ 63 mL/hr x 16 hours (1000 mL/day) - PROSource TF20 60 mL 1x/d - Fiber supplement (Banatrol TF) liquid 60 mL BID  - Mupirocin  ointment 2% twice daily - Protonix  40 mg daily

## 2024-07-30 NOTE — Progress Notes (Signed)
 Speech Language Pathology Treatment: Dysphagia;Cognitive-Linguistic  Patient Details Name: Wayne Molina MRN: [id number] DOB: [birth year] Today's Date: 07/30/2024 Time: 1207-1237 SLP Time Calculation (min) (ACUTE ONLY): 30 min  Assessment / Plan / Recommendation Clinical Impression  Patient was seen for both cognitive and swallow goals. Patient's voice was much improved since last visit. His voice is still weak and hoarse but this is improvement from recent sessions when patient was completely aphonic. Patient's alertness and attention have also improved from previous sessions. Patient engaged in conversation with the SLP during appropriate times, without frequent interruptions as noted by initial evaluation. Noticiable improvement in patient's tangential responses. The patient recalled the recent medical staff who had previously visited earlier in the morning. SLP noticed great improvement in the patient's attention and cognitive status.   Patient wasn't feel hungry during the session. The wife shared she thinks it might be due to the tube feedings not being turned off the previous night. Patient drank straw sips of water  for SLP. An immediate cough was observed following his straw sips. Cough is consistent to SLP's recent observations. Spouse and patient report PO intake has improved overall. SLP recommending continue with current diet with precautions which are posted at head of bed.    HPI HPI: Wayne Molina is a 68 y.o. man who presented to [facility] 8/28 with AMS. MRI large infiltrative tumor with heterogeneous enhancement tracking from the splenium of corpus callosum near midline into right temporal lobe. Course complicated by seizure; intubated 8/29-9/5.  Underwent craniotomy and biopsy 9/8 with result of glioblastoma multiforme stage IV. Has cortrak; on dysphagia 3/thin liquids. PMHx significant for HTN, HLD, Afib on Eliquis , untreated OSA.      SLP Plan  Continue with current plan of care           Recommendations  Diet recommendations: Dysphagia 3 (mechanical soft);Thin liquid Liquids provided via: Cup;Straw Medication Administration: Other (Comment) (as tolerated) Supervision: Full supervision/cueing for compensatory strategies Compensations: Minimize environmental distractions;Slow rate;Small sips/bites Postural Changes and/or Swallow Maneuvers: Seated upright 90 degrees;Upright 30-60 min after meal                  Oral care BID     Dysphagia, oropharyngeal phase (R13.12)     Continue with current plan of care    Damien Hy  Graduate SLP Clinican

## 2024-07-30 NOTE — Assessment & Plan Note (Addendum)
 HR at goal today. - Coreg  12.5 mg twice daily - Eliquis  5 mg BID

## 2024-07-30 NOTE — Assessment & Plan Note (Signed)
 Right gastrocnemius veins. No swelling of lower extremity, no pain. - Anticoagulation as above

## 2024-07-30 NOTE — Assessment & Plan Note (Signed)
 Suspected fungal etiology. - Clotrimazole  cream twice daily - Gerhardt's butt cream TID  - Desitin 40% ointment  - Imodium  4mg  prn - Consider rectal tube if diarrhea continues - Turn patient every 2 hours

## 2024-07-30 NOTE — Progress Notes (Signed)
 Notified by tCCMD pt had 34 beat run of VT. Pt asymptomatic BP 114/79 (BP Location: Left Arm)   Pulse 72   Temp 98 F (36.7 C)   Resp 18   Ht 5' 9 (1.753 m)   Wt 77 kg   SpO2 100%   BMI 25.07 kg/m   Family Medicine Intern notified through epic chat and page by phone # in [place]

## 2024-07-30 NOTE — Progress Notes (Addendum)
 Patient declined to sit up in chair today with multiple offers throughout shift. Patient able to repositioned in bed independently. MD place order to allow patient to leave unit and go outside. Please see order if patient request.

## 2024-07-30 NOTE — Assessment & Plan Note (Signed)
-   Seroquel  25 mg at 6pm; additional Seroquel  12.5 mg at 8pm - Melatonin 3 mg at 6pm

## 2024-07-30 NOTE — Progress Notes (Signed)
 Daily Progress Note Intern Pager: [phone]  Patient name: Wayne Molina Medical record number: [id number] Date of birth: 1956/04/28 Age: 68 y.o. Gender: male  Primary Care Provider: Janna Ferrier, DO Consultants: Neurosurgery, neurology, oncology, palliative  Code Status: DNR-limited   Pt Overview and Major Events to Date:  8/29-admitted, focal seizure in ED, intubated, to the ICU 9/4-extubated 9/8-neurosurgery completed open craniotomy for tumor biopsy and confirmed glioblastoma 9/10-care resumed from ICU 9/15-started on heparin  9/18-switched to Eliquis   Medical Decision Making:  Wayne Molina is a 68 year old male admitted for seizure unfortunately found to have a glioblastoma multiform.  Patient continues to work on functional status after status epilepticus.  Goals of care and team meeting on 09/23.  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 - Neurology consulted, recs appreciated - Neurosurgery consulted, appreciate recommendations  - Palliative care consulted, appreciate assistance in patient's care - Oncology consulted, appreciate recommendations -Repeat stat CT head if there is acute change in mental status or new neurological deficits - Keppra  1000 mg BID - Versed  prn for clinical seizures - IV Dexamethasone  2 mg Q8H (8am, 1pm, and 6pm along with other nightly medications at 6pm) - Taper per neurology: reduce to 2 mg q12h after 2 weeks (on 10/1), then after 2 weeks reduce to 2mg  once daily (on 10/15) - Glucose regimen: Lantus  16u at bedtime and SSI while on steroids to maintain glucose 140-180  - Eliquis  5 mg BID - Pain regimen: acetaminophen  1000 mg q6h, prn oxycodone  5 mg q4h - Nausea regimen: Zofran  4 mg PRN - Continuous cardiac monitoring - Neuro checks Q4H - PT/OT following - SLP following - Fall and seizure precautions - AM Labs: CBC Malnutrition of moderate degree Smallbore feeding  tube (Cortrak) left nare.  Appetite improving. -RD consulted, appreciate recs - Tube feeding via Cortrak: [name] 1.4 @ 63 mL/hr x 16 hours (1000 mL/day) - PROSource TF20 60 mL 1x/d - Fiber supplement (Banatrol TF) liquid 60 mL BID  - Mupirocin  ointment 2% twice daily - Protonix  40 mg daily Wound of gluteal cleft Irritant contact dermatitis due to fecal incontinence Suspected fungal etiology. - Clotrimazole  cream twice daily - Gerhardt's butt cream TID  - Desitin 40% ointment  - Imodium  4mg  prn - Consider rectal tube if diarrhea continues - Turn patient every 2 hours Atrial fibrillation with RVR (HCC) HR at goal today. - Coreg  12.5 mg twice daily - Eliquis  5 mg BID Thrombosis/embolism, venous Right gastrocnemius veins. No swelling of lower extremity, no pain. - Anticoagulation as above Sundowning - Seroquel  25 mg at 6pm; additional Seroquel  12.5 mg at 8pm - Melatonin 3 mg at 6pm Chronic health problem Urinary retention - Foley in place (failed void trail); on Flomax  0.4mg  HTN -controlled, continue home Coreg  12.5 mg BID. Holding home Losartan . Discontinued hydralazine  9/12. Systolic goal <160.  HLD - Continue home Atorvastatin  40mg  daily   FEN/GI: Dysphagia 3 PPx: Eliquis  Dispo: Pending improved p.o. intake and goals of care discussion  Subjective:  No acute events overnight.  Patient's partner at bedside reports he did have some confusion last night trying to get out of bed.  He was however easily redirectable.  Patient reports this morning he is doing okay, no new concerns.  Short run of vtac overnight, asymptomatic.  Objective: Temp:  [97.5 F (36.4 C)-98.8 F (37.1 C)] 98 F (36.7 C) (09/21 0006) Pulse Rate:  [73-91] 76 (09/21 0006) Resp:  [18-20] 18 (  09/21 0006) BP: (114-145)/(68-87) 124/74 (09/21 0006) SpO2:  [98 %-100 %] 99 % (09/21 0006) Physical Exam: General: NAD, frail-appearing, lying in hospital bed Neuro: A&O Cardiovascular: Irregular rate and  rhythm, no murmurs,  Respiratory: normal WOB on RA, CTAB, no wheezes, ronchi or rales Abdomen: soft, NTTP, no rebound or guarding Extremities: Moving all 4 extremities equally, no peripheral edema   Laboratory: Most recent CBC Lab Results  Component Value Date   WBC 14.0 (H) 07/29/2024   HGB 15.9 07/29/2024   HCT 47.0 07/29/2024   MCV 90.9 07/29/2024   PLT 602 (H) 07/29/2024   Most recent BMP    Latest Ref Rng & Units 07/28/2024    2:45 AM  BMP  Glucose 70 - 99 mg/dL 875   BUN 8 - 23 mg/dL 31   Creatinine 9.38 - 1.24 mg/dL 9.34   Sodium 864 - 854 mmol/L 137   Potassium 3.5 - 5.1 mmol/L 4.6   Chloride 98 - 111 mmol/L 101   CO2 22 - 32 mmol/L 22   Calcium  8.9 - 10.3 mg/dL 8.8      Imaging/Diagnostic Tests: No new imaging.  Alba Sharper, MD 07/30/2024, 12:33 AM  PGY-3, [facility] Family Medicine FPTS Intern pager: [phone], text pages welcome Secure chat group [facility] Teaching Service

## 2024-07-30 NOTE — Assessment & Plan Note (Addendum)
 Urinary retention - Foley in place (failed void trail); on Flomax  0.4mg  HTN -controlled, continue home Coreg  12.5 mg BID. Holding home Losartan . Discontinued hydralazine  9/12. Systolic goal <160.  HLD - Continue home Atorvastatin  40mg  daily

## 2024-07-31 ENCOUNTER — Inpatient Hospital Stay (HOSPITAL_COMMUNITY)

## 2024-07-31 DIAGNOSIS — L24A2 Irritant contact dermatitis due to fecal, urinary or dual incontinence: Secondary | ICD-10-CM | POA: Diagnosis not present

## 2024-07-31 DIAGNOSIS — C719 Malignant neoplasm of brain, unspecified: Secondary | ICD-10-CM | POA: Diagnosis not present

## 2024-07-31 LAB — GLUCOSE, CAPILLARY
Glucose-Capillary: 180 mg/dL — ABNORMAL HIGH (ref 70–99)
Glucose-Capillary: 137 mg/dL — ABNORMAL HIGH (ref 70–99)
Glucose-Capillary: 90 mg/dL (ref 70–99)
Glucose-Capillary: 156 mg/dL — ABNORMAL HIGH (ref 70–99)
Glucose-Capillary: 130 mg/dL — ABNORMAL HIGH (ref 70–99)

## 2024-07-31 MED ORDER — LACTULOSE 10 GM/15ML PO SOLN: 10.0000 g | Freq: Three times a day (TID) | ORAL | Status: DC

## 2024-07-31 MED ORDER — LACTULOSE 10 GM/15ML PO SOLN
10.0000 g | Freq: Three times a day (TID) | ORAL | Status: DC
  Administered 2024-08-01: 10 g via ORAL
  Filled 2024-07-31: qty 30

## 2024-07-31 MED ORDER — QUETIAPINE FUMARATE 25 MG PO TABS: 12.5000 mg | ORAL_TABLET | Freq: Every evening | ORAL | Status: DC | PRN

## 2024-07-31 MED ORDER — POLYETHYLENE GLYCOL 3350 17 G PO PACK
17.0000 g | PACK | Freq: Every day | ORAL | Status: DC
  Administered 2024-07-31: 17 g via ORAL
  Filled 2024-07-31: qty 1

## 2024-07-31 MED ORDER — FREE WATER
30.0000 mL | Freq: Every day | Status: DC
Start: 2024-07-31 — End: 2024-08-01
  Administered 2024-07-31 – 2024-08-01 (×2): 30 mL

## 2024-07-31 MED ORDER — GUAIFENESIN 100 MG/5ML PO LIQD: 15.0000 mL | Freq: Four times a day (QID) | ORAL | Status: DC | PRN

## 2024-07-31 MED ORDER — FLEET ENEMA RE ENEM
1.0000 | ENEMA | Freq: Once | RECTAL | Status: AC
Start: 2024-07-31 — End: 2024-07-31
  Administered 2024-07-31: 1 via RECTAL
  Filled 2024-07-31: qty 1

## 2024-07-31 MED ORDER — INSULIN GLARGINE 100 UNIT/ML ~~LOC~~ SOLN
8.0000 [IU] | Freq: Every day | SUBCUTANEOUS | Status: DC
  Administered 2024-07-31 – 2024-08-03 (×4): 8 [IU] via SUBCUTANEOUS
  Filled 2024-07-31 (×6): qty 0.08

## 2024-07-31 NOTE — Assessment & Plan Note (Addendum)
 Smallbore feeding tube (Cortrak) left nare.  - Holding all tube feeds today 9/22 - RD consulted, appreciate recs - Tube feeding via Cortrak: [name] 1.4 @ 63 mL/hr x 16 hours (1000 mL/day) - PROSource TF20 60 mL 1x/d - Fiber supplement (Banatrol TF) liquid 60 mL BID > stopping - Ensure QID - Protonix  40 mg daily

## 2024-07-31 NOTE — Assessment & Plan Note (Signed)
 Suspected fungal etiology. - Clotrimazole  cream twice daily - Gerhardt's butt cream TID  - Desitin 40% ointment  - Turn patient every 2 hours - Consider rectal tube if diarrhea continues

## 2024-07-31 NOTE — Assessment & Plan Note (Signed)
 Urinary retention - Foley in place (failed void trail); on Flomax  0.4mg  HTN -controlled, continue home Coreg  12.5 mg BID. Holding home Losartan . Discontinued hydralazine  9/12. Systolic goal <160.  HLD - Continue home Atorvastatin  40mg  daily

## 2024-07-31 NOTE — Plan of Care (Signed)
 HPI: Wayne Molina to see patient at request of patient's wife and nurse.  He reports he feels like he has 20 pounds of potatoes and a 5 pound bag in his stomach.  Reports abdominal pain and feeling of distention/fullness.  No nausea or vomiting.  No bowel movement over the last 2 days, reports he is still passing gas.  Reports he normally has 3-4 bowel movements a day at baseline.  Is unable to eat much or tolerate p.o. given abdominal discomfort.  Trying to move legs and stay active.  PE: Abd: Bowel sounds present and hyperactive bilaterally. Soft, mildly distended, mildly tender to palpation in LLQ, no rebound, no guarding.  A/P: Abdominal Pain Suspect related to constipation.  Reassuringly, with soft abdomen and bowel sounds present, still passing gas.  Discussed options for management, and patient requesting enema. - Ordered Fleet enema once - Lactulose  10 mg 3 times daily ordered for tomorrow 9/23; if enema not effective in improving pain, can consider earlier dosing today 9/22  Wayne Flies, MD 07/31/24 3:33 PM PGY-1, [place] [facility]

## 2024-07-31 NOTE — Progress Notes (Signed)
 Nutrition Follow-up  DOCUMENTATION CODES:  Non-severe (moderate) malnutrition in context of chronic illness  INTERVENTION:  Continue current diet as ordered Encourage PO intake Family to assist with ordering meals Ensure Plus High Protein po QID, each supplement provides 350 kcal and 20 grams of protein, pt prefers chocolate Discontinue enteral feeds and water  flushes per MD request until after family meeting.   NUTRITION DIAGNOSIS:  Moderate Malnutrition related to chronic illness as evidenced by moderate fat depletion, severe muscle depletion. - remains applicable  GOAL:  Patient will meet greater than or equal to 90% of their needs - progressing  MONITOR:  PO intake, TF tolerance, I & O's, Skin  REASON FOR ASSESSMENT:  Consult, Ventilator Enteral/tube feeding initiation and management  ASSESSMENT:  Pt admitted with AMS, found to have new brain mass. PMH significant for afib on eliquis , HTN, HLD.  8/28: admitted 8/29: intubated d/t acute hypoxic respiratory failure 8/31: tube feeding initiated 9/2 - Cortrak placed, tip gastric  9/4 - extubated 9/8 - Op, right craniotomy to obtain brain tumor biopsy 9/9 - SLP BSE, DYS2, thin 9/10 - Diet changed to DYS3  Pt and wife present in room at the time of visit. Discussed kcal count over the weekend pt's intake on average meeting <50% of needs. Stools have solidified and pt now feeling constipated. Bowel regimen being added by resident team. Neurology requests TF be held so see if it helps with intake. Stressed to pt and wife that on days pt consumed ensure he was much closer to meeting his needs. Encouraged 1-2 minimum each day.  PMT meeting to be held tomorrow to discuss pt's overall intake and plan of care moving forward.   Admit weight: 73.9 kg  Current weight: 77 kg    Nutritionally Relevant Medications: Scheduled Meds:  atorvastatin   40 mg Oral Daily   dexamethasone   2 mg Intravenous 3 times per day   Ensure Plus High  Protein  237 mL Oral TID PC & HS   [employer] 1.4  1,000 mL Oral Daily   PROSource TF20  60 mL Per Tube Daily   BANATROL TF  60 mL Oral Daily   free water   100 mL Per Tube Q4H   insulin  aspart  0-15 Units Subcutaneous Q4H   insulin  glargine  16 Units Subcutaneous QHS   levETIRAcetam   1,000 mg Oral BID   pantoprazole   40 mg Oral Daily   PRN Meds: ondansetron    Labs Reviewed: BUN 29 CBG ranges from 90-235 mg/dL over the last 24 hours HgbA1c 6.0% (8/29)  NUTRITION - FOCUSED PHYSICAL EXAM: Flowsheet Row Most Recent Value  Orbital Region Moderate depletion  Upper Arm Region Mild depletion  Thoracic and Lumbar Region Mild depletion  Buccal Region Moderate depletion  Temple Region Severe depletion  Clavicle Bone Region Moderate depletion  Clavicle and Acromion Bone Region Moderate depletion  Scapular Bone Region Unable to assess  Dorsal Hand Moderate depletion  Patellar Region Severe depletion  Anterior Thigh Region Severe depletion  Posterior Calf Region Moderate depletion  Edema (RD Assessment) None  Hair Reviewed  Eyes Reviewed  Mouth Reviewed  Skin Reviewed  Nails Reviewed    Diet Order:   Diet Order             DIET DYS 3 Room service appropriate? Yes with Assist; Fluid consistency: Thin  Diet effective now                   EDUCATION NEEDS:  Education  needs have been addressed  Skin:  Skin Assessment: Skin Integrity Issues: (irritant dermatitis, buttocks)  Last BM:  9/20 - type 4  Height:  Ht Readings from Last 1 Encounters:  07/07/24 5' 9 (1.753 m)    Weight:  Wt Readings from Last 1 Encounters:  07/28/24 77 kg    Ideal Body Weight:  72.7 kg  BMI:  Body mass index is 25.07 kg/m.  Estimated Nutritional Needs:  Kcal:  1800-2000 Protein:  95-110g Fluid:  >/=1.8L    Vernell Lukes, RD, LDN, CNSC Registered Dietitian II Please reach out via secure chat

## 2024-07-31 NOTE — Assessment & Plan Note (Addendum)
 Continues to have improvement in po intake; continuing to work on strength. - Neurology consulted, recs appreciated - Neurosurgery consulted, appreciate recommendations  - Palliative consulted, appreciate assistance in patient's care - Oncology consulted, appreciate recommendations - Repeat stat CT head if there is acute change in mental status or new neurological deficits - Keppra  1000 mg BID - Versed  prn for clinical seizures - IV Dexamethasone  2 mg Q8H (8am, 1pm, and 6pm along with other nightly medications at 6pm) - Taper per neurology: reduce to 2 mg q12h after 2 weeks (on 10/1), then after 2 weeks reduce to 2mg  once daily (on 10/15) - Glucose regimen: Lantus  16u at bedtime and SSI while on steroids to maintain glucose 140-180 > halving to 8u tonight given no tube feeds today - Eliquis  5 mg BID - Pain regimen: acetaminophen  1000 mg q6h, prn oxycodone  5 mg q4h - Cough: Guaifenesin  15 mL q6h > changing to PRN; throat spray prn - Bowel Regimen: Stopping fiber through tube; adding Lactulose  10g TID through 9/23 (volume of Miralax  too much for pt to tolerate) - Nausea regimen: Zofran  4 mg PRN - Continuous cardiac monitoring - Neuro checks Q4H - PT/OT following - SLP following - Fall and seizure precautions - AM Labs: BMP, Mag, Phos, CBC

## 2024-07-31 NOTE — Progress Notes (Signed)
 Daily Progress Note Intern Pager: [phone]  Patient name: SAMANTHA OLIVERA Medical record number: [id number] Date of birth: 03/05/1956 Age: 68 y.o. Gender: male  Primary Care Provider: Janna Ferrier, DO Consultants: Neurosurgery, neurology, oncology, palliative Code Status: DNR-Limited  Pt Overview and Major Events to Date:  8/29-admitted, focal seizure in ED, intubated, to the ICU 9/4-extubated 9/8-neurosurgery completed open craniotomy for tumor biopsy and confirmed glioblastoma 9/10-care resumed from ICU 9/15-started on heparin  9/18-switched to Eliquis   Medical Decision Making:  TIM CORRIHER is a 68 y.o. male with PMH of A fib, OSA, osteoporosis, HTN, HLD admitted for seizure and found to have glioblastoma multiforme. Continuing to work on functional status (strength, po intake).   Goals of care discussion and team meeting upcoming on 9/23. 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 Continues to have improvement in po intake; continuing to work on strength. - Neurology consulted, recs appreciated - Neurosurgery consulted, appreciate recommendations  - Palliative consulted, appreciate assistance in patient's care - Oncology consulted, appreciate recommendations - Repeat stat CT head if there is acute change in mental status or new neurological deficits - Keppra  1000 mg BID - Versed  prn for clinical seizures - IV Dexamethasone  2 mg Q8H (8am, 1pm, and 6pm along with other nightly medications at 6pm) - Taper per neurology: reduce to 2 mg q12h after 2 weeks (on 10/1), then after 2 weeks reduce to 2mg  once daily (on 10/15) - Glucose regimen: Lantus  16u at bedtime and SSI while on steroids to maintain glucose 140-180 > halving to 8u tonight given no tube feeds today - Eliquis  5 mg BID - Pain regimen: acetaminophen  1000 mg q6h, prn oxycodone  5 mg q4h - Cough: Guaifenesin  15 mL q6h > changing to PRN; throat spray prn - Bowel  Regimen: Stopping fiber through tube; adding Lactulose  10g TID through 9/23 (volume of Miralax  too much for pt to tolerate) - Nausea regimen: Zofran  4 mg PRN - Continuous cardiac monitoring - Neuro checks Q4H - PT/OT following - SLP following - Fall and seizure precautions - AM Labs: BMP, Mag, Phos, CBC Malnutrition of moderate degree Smallbore feeding tube (Cortrak) left nare.  - Holding all tube feeds today 9/22 - RD consulted, appreciate recs - Tube feeding via Cortrak: [name] 1.4 @ 63 mL/hr x 16 hours (1000 mL/day) - PROSource TF20 60 mL 1x/d - Fiber supplement (Banatrol TF) liquid 60 mL BID > stopping - Ensure QID - Protonix  40 mg daily Wound of gluteal cleft Irritant contact dermatitis due to fecal incontinence Suspected fungal etiology. - Clotrimazole  cream twice daily - Gerhardt's butt cream TID  - Desitin 40% ointment  - Turn patient every 2 hours - Consider rectal tube if diarrhea continues Atrial fibrillation with RVR (HCC) - Coreg  12.5 mg twice daily - Eliquis  5 mg BID Thrombosis/embolism, venous Right gastrocnemius veins. - Anticoagulation as above Sundowning - Seroquel  25 mg at 6pm; additional Seroquel  12.5 mg at 8pm > changing 12.5 to PRN - Melatonin 3 mg at 6pm Chronic health problem Urinary retention - Foley in place (failed void trail); on Flomax  0.4mg  HTN - controlled, continue home Coreg  12.5 mg BID. Holding home Losartan . Discontinued hydralazine  9/12. Systolic goal <160.  HLD - Continue home Atorvastatin  40mg  daily  FEN/GI: Dys 3 PPx: Eliquis  5 mg BID Dispo: Pending improved po intake and goals of care discussion  Subjective:  Patient reports he is doing well overall.  Does report stomach fullness with eating, and  has not had a bowel movement in 2 days.  He does report pain in buttocks has improved, though he is hesitant to have a bowel movement given increased pain with this.    He was seen eating breakfast.  Wife was present throughout  visit and contributed to discussion.  Objective: Temp:  [97.5 F (36.4 C)-98.8 F (37.1 C)] 97.5 F (36.4 C) (09/22 0358) Pulse Rate:  [62-87] 87 (09/22 0358) Resp:  [17-18] 18 (09/22 0358) BP: (111-133)/(79-93) 133/93 (09/22 0358) SpO2:  [94 %-98 %] 97 % (09/22 0358) Physical Exam: General: Patient lying in bed with head of bed elevated, eating breakfast, no acute distress. Cardiovascular: Irregularly irregular Respiratory: Normal work of breathing on room air. Clear to auscultation bilaterally; no wheezes, crackles. Abdomen: Bowel sounds present and normoactive bilaterally. Soft, nondistended.  Tenderness to palpation over bilateral lower abdomen, no rebound, no guarding. Extremities: Skin cool, dry. No bilateral lower extremity edema.  Laboratory: Most recent CBC Lab Results  Component Value Date   WBC 15.0 (H) 07/30/2024   HGB 17.4 (H) 07/30/2024   HCT 50.9 07/30/2024   MCV 90.1 07/30/2024   PLT 571 (H) 07/30/2024   Most recent BMP    Latest Ref Rng & Units 07/30/2024    8:52 AM  BMP  Glucose 70 - 99 mg/dL 897   BUN 8 - 23 mg/dL 33   Creatinine 9.38 - 1.24 mg/dL 9.21   Sodium 864 - 854 mmol/L 134   Potassium 3.5 - 5.1 mmol/L 4.8   Chloride 98 - 111 mmol/L 101   CO2 22 - 32 mmol/L 25   Calcium  8.9 - 10.3 mg/dL 8.5     Larraine Palma, MD 07/31/2024, 7:16 AM  PGY-1, [place] [facility] FPTS Intern pager: [phone], text pages welcome Secure chat group [facility] Teaching Service

## 2024-07-31 NOTE — Progress Notes (Signed)
 Occupational Therapy Treatment Patient Details Name: Wayne Molina MRN: [id number] DOB: 06-Nov-1956 Today's Date: 07/31/2024   History of present illness Pt is a 68 y/o male presenting on 8/28 with L arm weakness, L hemianopsia, and slurred speech. 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 Course complicated by seizure episode. Intubated 8/29, extubated 9/4 but still with difficulty managing secretions.  Biopsy on 9/8. +DVT R LE 9/14.  PMH includes: anxiety, HLD, HTN, sleep apnea, chronic afib.   OT comments  Pt greeted in supine, alert and agreeable for OT session. Supportive wife at bedside. Pt's goals updated today given his great OT progress. Team and CIR coordinator updated as well. Scored 15/24 on AMPAC ADL, indicating improved functional performance. He was able to transition EOB with min A, cued to attend to L side. Pt maintained seated balance with supervision while engaged in grooming tasks with setup - needing intermittent cues to locate items located to the left side of tray table. Pt with improved transfers today needing no more than min-mod A +1 to stand to RW and min A to take several small steps to recliner chair via RW. Cued for sequencing and safe approach to chair. See below for further details re: functional cognition. Left upright in chair with alarm on and wife at bedside. OT to continue to follow.  Pt has demonstrated great progress towards OT goals and is still recommended for high-intensity post-acute rehab (>3 hours/day) upon discharge.      If plan is discharge home, recommend the following:  A lot of help with walking and/or transfers;Two people to help with walking and/or transfers;A little help with bathing/dressing/bathroom;Assistance with cooking/housework;Direct supervision/assist for medications management;Direct supervision/assist for financial management;Assist for  transportation;Help with stairs or ramp for entrance;Supervision due to cognitive status;Assistance with feeding   Equipment Recommendations  Other (comment) (defer to next level of care)    Recommendations for Other Services      Precautions / Restrictions Precautions Precautions: Fall;Other (comment) Recall of Precautions/Restrictions: Impaired Precaution/Restrictions Comments: delirium prevention; Cortrak; SBP <160 Restrictions Weight Bearing Restrictions Per Provider Order: No       Mobility Bed Mobility Overal bed mobility: Needs Assistance Bed Mobility: Supine to Sit     Supine to sit: Min assist, HOB elevated, Used rails     General bed mobility comments: Cues for sequencing and bed features to assist. Exited to the L side with cues to attend to L.    Transfers Overall transfer level: Needs assistance Equipment used: Rolling walker (2 wheels) Transfers: Sit to/from Stand, Bed to chair/wheelchair/BSC Sit to Stand: Min assist, Mod assist     Step pivot transfers: Min assist     General transfer comment: Pt min-mod A +1 to stand from lower bed surface with multimodal cueing for safe hand placement. Took several small steps to chair + RW with min A and cueing for sequencing and safe chair approach.     Balance Overall balance assessment: Needs assistance Sitting-balance support: No upper extremity supported, Feet supported Sitting balance-Leahy Scale: Good Sitting balance - Comments: pt without LOB/lean observed while EOB, maintained dynamic sitting balance without UE support to complete ADL tasks Postural control: Posterior lean (initially in stance) Standing balance support: Bilateral upper extremity supported, Reliant on assistive device for balance, During functional activity Standing balance-Leahy Scale: Poor Standing balance comment: utilized RW in stance; pt initially retropulsive upon standing but corrects with cueing  ADL either performed or assessed with clinical judgement   ADL   Eating/Feeding: Set up;Sitting Eating/Feeding Details (indicate cue type and reason): sipping from straw in cup without spillage; provided meal and utensil setup on lunch tray Grooming: Oral care;Wash/dry face;Set up;Cueing for sequencing;Sitting Grooming Details (indicate cue type and reason): cues to locate grooming supplies located to the left side of tray table, cued for task sequencing and overall organization for improved efficiency         Upper Body Dressing : Minimal assistance;Cueing for sequencing;Bed level (HOB elevated) Upper Body Dressing Details (indicate cue type and reason): donned clean gown, cues for sequencing and assist to thoroughly thread LUE                        Extremity/Trunk Assessment Upper Extremity Assessment LUE Deficits / Details: at least ~3/5 based off observation via functional tasks            Vision   Vision Assessment?: Vision impaired- to be further tested in functional context Additional Comments: continues to require multimodal cues to scan environment and visually attend to the L side   Perception Perception Perception: Impaired Preception Impairment Details: Inattention/Neglect Perception-Other Comments: L visual inattention present   Praxis Praxis Praxis: Impaired Praxis Impairment Details: Motor planning;Organization   Communication Communication Communication: Impaired Factors Affecting Communication: Reduced clarity of speech (hypophonic)   Cognition Arousal: Alert Behavior During Therapy: Flat affect Cognition: Cognition impaired   Orientation impairments: Situation Awareness: Intellectual awareness impaired Memory impairment (select all impairments): Short-term memory Attention impairment (select first level of impairment): Sustained attention Executive functioning impairment (select all impairments): Organization, Sequencing, Reasoning,  Problem solving OT - Cognition Comments: pt with flat affect but participatory; reduced attention throughout but able to be redirected; less impulsivity noted this date                 Following commands: Impaired Following commands impaired: Follows one step commands with increased time, Follows multi-step commands inconsistently      Cueing   Cueing Techniques: Verbal cues, Tactile cues, Visual cues  Exercises      Shoulder Instructions       General Comments wife present and supportive during OT session    Pertinent Vitals/ Pain       Pain Assessment Pain Assessment: No/denies pain Pain Score: 0-No pain  Home Living                                          Prior Functioning/Environment              Frequency  Min 2X/week        Progress Toward Goals  OT Goals(current goals can now be found in the care plan section)  Progress towards OT goals: Progressing toward goals  Acute Rehab OT Goals Time For Goal Achievement: 08/14/24 ADL Goals Pt Will Perform Grooming: with contact guard assist;standing Pt Will Transfer to Toilet: with contact guard assist;ambulating;bedside commode Pt/caregiver will Perform Home Exercise Program: Increased strength;Increased ROM;Left upper extremity;With Supervision Additional ADL Goal #1: Pt will visually track and attend to locate items on L side 75% of the time to promote improved independence with ADLs and functional tasks. Additional ADL Goal #2: Pt will perform bed mobility with supervision in preparation for seated ADLs and functional tasks.  Plan      Co-evaluation  AM-PAC OT 6 Clicks Daily Activity     Outcome Measure   Help from another person eating meals?: A Little Help from another person taking care of personal grooming?: A Little Help from another person toileting, which includes using toliet, bedpan, or urinal?: A Lot Help from another person bathing (including  washing, rinsing, drying)?: A Lot Help from another person to put on and taking off regular upper body clothing?: A Little Help from another person to put on and taking off regular lower body clothing?: A Lot 6 Click Score: 15    End of Session Equipment Utilized During Treatment: Gait belt;Rolling walker (2 wheels)  OT Visit Diagnosis: Unsteadiness on feet (R26.81);Other abnormalities of gait and mobility (R26.89);Muscle weakness (generalized) (M62.81)   Activity Tolerance Patient tolerated treatment well   Patient Left in chair;with call bell/phone within reach;with chair alarm set;with family/visitor present   Nurse Communication Mobility status     Time: 8781-8753 OT Time Calculation (min): 28 min  Charges: OT General Charges $OT Visit: 1 Visit OT Treatments $Self Care/Home Management : 23-37 mins  Pharrah Rottman D., MS, OTR/L Acute Rehabilitation Services [phone] Secure Chat Preferred  Rikki Milch 07/31/2024, 2:12 PM

## 2024-07-31 NOTE — Progress Notes (Signed)
 Calorie Count Note  48-hour calorie count ordered.  Collected tickets x 72 hours over the weekend. Results can be found below. Full follow-up note to be completed today. Discussed results with MD.  Diet: DYS3/thin Supplements: Ensure Plus High Protein QID  Estimated Nutritional Needs:  Kcal:  1800-2000 Protein:  95-110g Fluid:  >/=1.8L  Day 1 9/19 Breakfast: 200 kcal, 6g protein 9/19 Lunch: pt refused 9/19 Dinner: 320 kcal, 11g protein Supplements: 1 ensure 350 kcal, 20g protein  Total intake Day 1: 870 kcal (48% of minimum estimated needs)  37 protein (39% of minimum estimated needs)  Day 2 9/20 Breakfast: 236 kcal, 6g protein 9/20 Lunch: pt refused 9/20 Dinner: 182 kcal, 8g protein Supplements: 2 ensures 700 kcal, 40g protein  Total intake Day 2: 1118 kcal (62% of minimum estimated needs)  54 protein (57% of minimum estimated needs)  Day 3 9/21 Breakfast: 150 kcal, 9g protein 9/21 Lunch: 80kcal, 7g protein 9/21 Dinner: 201 kcal, 13g protein Supplements: none recorded  Total intake Day 3: 431 kcal (24% of minimum estimated needs)  29 protein (31% of minimum estimated needs)    72-hour average: 806 kcal/d (45% of minimum estimated needs)  40 protein (42% of minimum estimated needs)   Vernell Lukes, RD, LDN, CNSC Registered Dietitian II Please reach out via secure chat

## 2024-07-31 NOTE — TOC Progression Note (Signed)
 Transition of Care [facility]) - Progression Note    Patient Details  Name: Wayne Molina MRN: [id number] Date of Birth: 07-02-1956  [facility] CM/SW Contact  Andrez JULIANNA George, RN Phone Number: 07/31/2024, 10:59 AM  Clinical Narrative:     Goals of care and team meeting on 09/23. Pt still with cortrak and nocturnal tube feedings.  IP Care management following.  Expected Discharge Plan: IP Rehab Facility Barriers to Discharge: Continued Medical Work up               Expected Discharge Plan and Services       Living arrangements for the past 2 months: Single Family Home                                       Social Drivers of Health (SDOH) Interventions SDOH Screenings   Food Insecurity: No Food Insecurity (03/22/2024)  Housing: Low Risk  (03/22/2024)  Transportation Needs: No Transportation Needs (03/22/2024)  Utilities: Not At Risk (04/16/2023)  Alcohol Screen: Low Risk  (04/16/2023)  Depression (PHQ2-9): Low Risk  (03/23/2024)  Financial Resource Strain: Low Risk  (03/22/2024)  Physical Activity: Sufficiently Active (03/22/2024)  Social Connections: Unknown (03/22/2024)  Stress: No Stress Concern Present (03/22/2024)  Tobacco Use: Medium Risk (07/07/2024)    Readmission Risk Interventions     No data to display

## 2024-07-31 NOTE — Progress Notes (Signed)
 Subjective: No acute events overnight.  Patient and wife state he has been trying to eat more but still not eating as much as he does at home because states he is too full.  States the burning sensation in his throat has significantly improved.  ROS: negative except above Examination  Vital signs in last 24 hours: Temp:  [97.5 F (36.4 C)-98.8 F (37.1 C)] 97.6 F (36.4 C) (09/22 0804) Pulse Rate:  [76-90] 90 (09/22 0804) Resp:  [16-18] 16 (09/22 0804) BP: (113-133)/(79-93) 120/84 (09/22 0804) SpO2:  [97 %-100 %] 100 % (09/22 0804)  General: lying in bed, NAD Neuro:Awake, alert, oriented to person and place, follows commands, antigravity strength in all 4 extremities   Basic Metabolic Panel: Recent Labs  Lab 07/26/24 0800 07/27/24 0443 07/28/24 0245 07/30/24 0623 07/30/24 0852  NA 135 135 137 135 134*  K 5.0 4.7 4.6 4.7 4.8  CL 102 98 101 98 101  CO2 16* 22 22 28 25   GLUCOSE 156* 164* 124* 101* 102*  BUN 38* 32* 31* 32* 33*  CREATININE 0.92 0.85 0.65 0.72 0.78  CALCIUM  8.7* 8.9 8.8* 8.9 8.5*  MG 2.3  --   --  2.2 2.2  PHOS  --   --   --  4.4  --     CBC: Recent Labs  Lab 07/26/24 0800 07/27/24 0443 07/28/24 0245 07/29/24 0545 07/30/24 0623  WBC 14.0* 13.0* 14.6* 14.0* 15.0*  HGB 15.9 15.5 15.4 15.9 17.4*  HCT 48.7 45.8 45.6 47.0 50.9  MCV 93.8 89.8 90.3 90.9 90.1  PLT 395 650* 611* 602* 571*     Coagulation Studies: No results for input(s): LABPROT, INR in the last 72 hours.  Imaging No new brain imaging overnight   ASSESSMENT AND PLAN:68 y.o. 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  After 2 weeks, can reduce to 2 mg every 12 hours and after another 2 weeks reduce to 2 mg once daily.  - Continue Eliquis  for A-fib -Continue to  encourage p.o. diet and hold tube feeds today.  Communicated with dietitian and medicine team -As needed Versed  for clinical seizures -Plan for goals of care meeting tomorrow at 10 AM. -Discussed plan with wife at bedside and medicine team via secure chat       I personally spent a total of 36 minutes in the care of the patient today including getting/reviewing separately obtained history, performing a medically appropriate exam/evaluation, counseling and educating, placing orders, referring and communicating with other health care professionals, documenting clinical information in the EHR, independently interpreting results, and coordinating care.          [facility] For questions after 5pm please refer to AMION to reach the Neurologist on call

## 2024-07-31 NOTE — Assessment & Plan Note (Addendum)
-   Seroquel  25 mg at 6pm; additional Seroquel  12.5 mg at 8pm > changing 12.5 to PRN - Melatonin 3 mg at 6pm

## 2024-07-31 NOTE — Progress Notes (Signed)
 Remote Loop Recorder Transmission

## 2024-07-31 NOTE — Assessment & Plan Note (Signed)
-   Coreg  12.5 mg twice daily - Eliquis  5 mg BID

## 2024-07-31 NOTE — Assessment & Plan Note (Signed)
 Right gastrocnemius veins. - Anticoagulation as above

## 2024-08-01 DIAGNOSIS — L24A2 Irritant contact dermatitis due to fecal, urinary or dual incontinence: Secondary | ICD-10-CM | POA: Diagnosis not present

## 2024-08-01 DIAGNOSIS — Z7189 Other specified counseling: Secondary | ICD-10-CM | POA: Diagnosis not present

## 2024-08-01 DIAGNOSIS — G9340 Encephalopathy, unspecified: Secondary | ICD-10-CM | POA: Diagnosis not present

## 2024-08-01 DIAGNOSIS — C719 Malignant neoplasm of brain, unspecified: Secondary | ICD-10-CM | POA: Diagnosis not present

## 2024-08-01 DIAGNOSIS — E440 Moderate protein-calorie malnutrition: Secondary | ICD-10-CM | POA: Diagnosis not present

## 2024-08-01 DIAGNOSIS — Z515 Encounter for palliative care: Secondary | ICD-10-CM | POA: Diagnosis not present

## 2024-08-01 DIAGNOSIS — I472 Ventricular tachycardia, unspecified: Secondary | ICD-10-CM | POA: Insufficient documentation

## 2024-08-01 LAB — CBC
HCT: 48.3 % (ref 39.0–52.0)
Hemoglobin: 16.5 g/dL (ref 13.0–17.0)
MCH: 30.6 pg (ref 26.0–34.0)
MCHC: 34.2 g/dL (ref 30.0–36.0)
MCV: 89.6 fL (ref 80.0–100.0)
Platelets: 474 K/uL — ABNORMAL HIGH (ref 150–400)
RBC: 5.39 MIL/uL (ref 4.22–5.81)
RDW: 13 % (ref 11.5–15.5)
WBC: 13.5 K/uL — ABNORMAL HIGH (ref 4.0–10.5)
nRBC: 0 % (ref 0.0–0.2)

## 2024-08-01 LAB — GLUCOSE, CAPILLARY
Glucose-Capillary: 103 mg/dL — ABNORMAL HIGH (ref 70–99)
Glucose-Capillary: 113 mg/dL — ABNORMAL HIGH (ref 70–99)
Glucose-Capillary: 194 mg/dL — ABNORMAL HIGH (ref 70–99)
Glucose-Capillary: 245 mg/dL — ABNORMAL HIGH (ref 70–99)
Glucose-Capillary: 264 mg/dL — ABNORMAL HIGH (ref 70–99)
Glucose-Capillary: 88 mg/dL (ref 70–99)

## 2024-08-01 LAB — BASIC METABOLIC PANEL WITH GFR
Anion gap: 9 (ref 5–15)
BUN: 40 mg/dL — ABNORMAL HIGH (ref 8–23)
CO2: 27 mmol/L (ref 22–32)
Calcium: 8.8 mg/dL — ABNORMAL LOW (ref 8.9–10.3)
Chloride: 100 mmol/L (ref 98–111)
Creatinine, Ser: 0.79 mg/dL (ref 0.61–1.24)
GFR, Estimated: >60 mL/min (ref >60)
Glucose, Bld: 90 mg/dL (ref 70–99)
Potassium: 4.7 mmol/L (ref 3.5–5.1)
Sodium: 136 mmol/L (ref 135–145)

## 2024-08-01 LAB — MAGNESIUM: Magnesium: 2.4 mg/dL (ref 1.7–2.4)

## 2024-08-01 LAB — PHOSPHORUS: Phosphorus: 4.5 mg/dL (ref 2.5–4.6)

## 2024-08-01 MED ORDER — LACTULOSE 10 GM/15ML PO SOLN
10.0000 g | Freq: Three times a day (TID) | ORAL | Status: DC
  Administered 2024-08-01 – 2024-08-04 (×9): 10 g via ORAL
  Filled 2024-08-01 (×9): qty 30

## 2024-08-01 MED ORDER — OXYCODONE HCL 5 MG PO TABS
5.0000 mg | ORAL_TABLET | Freq: Four times a day (QID) | ORAL | Status: DC | PRN
  Administered 2024-08-01 – 2024-08-02 (×2): 5 mg via ORAL
  Filled 2024-08-01 (×2): qty 1

## 2024-08-01 MED ORDER — DEXAMETHASONE 2 MG PO TABS: 2.0000 mg | ORAL_TABLET | Freq: Two times a day (BID) | ORAL | Status: DC

## 2024-08-01 MED ORDER — DEXAMETHASONE 2 MG PO TABS: 2.0000 mg | ORAL_TABLET | Freq: Every day | ORAL | Status: DC

## 2024-08-01 MED ORDER — INSULIN ASPART 100 UNIT/ML IJ SOLN: 0.0000 [IU] | Freq: Three times a day (TID) | INTRAMUSCULAR | Status: DC

## 2024-08-01 MED ORDER — INSULIN ASPART 100 UNIT/ML IJ SOLN
0.0000 [IU] | Freq: Three times a day (TID) | INTRAMUSCULAR | Status: DC
  Administered 2024-08-01: 3 [IU] via SUBCUTANEOUS
  Administered 2024-08-02: 2 [IU] via SUBCUTANEOUS
  Administered 2024-08-02: 1 [IU] via SUBCUTANEOUS
  Administered 2024-08-02: 3 [IU] via SUBCUTANEOUS
  Administered 2024-08-03 (×2): 2 [IU] via SUBCUTANEOUS
  Administered 2024-08-04: 1 [IU] via SUBCUTANEOUS
  Administered 2024-08-04: 2 [IU] via SUBCUTANEOUS

## 2024-08-01 MED ORDER — DEXAMETHASONE 2 MG PO TABS
2.0000 mg | ORAL_TABLET | Freq: Three times a day (TID) | ORAL | Status: DC
  Administered 2024-08-01 – 2024-08-04 (×10): 2 mg via ORAL
  Filled 2024-08-01 (×10): qty 1

## 2024-08-01 NOTE — Progress Notes (Signed)
 Subjective: Was constipated overnight and had enema.  Reported feeling much better this morning.  Eating his breakfast.  Wife and brother at bedside.  States he is interested in lifting weights and walking on treadmill to improve his strength for chemoradiation  ROS: negative except above  Examination  Vital signs in last 24 hours: Temp:  [97.4 F (36.3 C)-98 F (36.7 C)] 97.4 F (36.3 C) (09/23 0802) Pulse Rate:  [46-96] 76 (09/23 0802) Resp:  [14-18] 18 (09/23 0802) BP: (110-133)/(65-96) 125/76 (09/23 0802) SpO2:  [90 %-100 %] 99 % (09/23 0802)  General: lying in bed, NAD Neuro:Awake, alert, oriented to person and place, follows commands, antigravity strength in all 4 extremities     Basic Metabolic Panel: Recent Labs  Lab 07/26/24 0800 07/27/24 0443 07/28/24 0245 07/30/24 0623 07/30/24 0852 08/01/24 0438  NA 135 135 137 135 134* 136  K 5.0 4.7 4.6 4.7 4.8 4.7  CL 102 98 101 98 101 100  CO2 16* 22 22 28 25 27   GLUCOSE 156* 164* 124* 101* 102* 90  BUN 38* 32* 31* 32* 33* 40*  CREATININE 0.92 0.85 0.65 0.72 0.78 0.79  CALCIUM  8.7* 8.9 8.8* 8.9 8.5* 8.8*  MG 2.3  --   --  2.2 2.2 2.4  PHOS  --   --   --  4.4  --  4.5    CBC: Recent Labs  Lab 07/27/24 0443 07/28/24 0245 07/29/24 0545 07/30/24 0623 08/01/24 0438  WBC 13.0* 14.6* 14.0* 15.0* 13.5*  HGB 15.5 15.4 15.9 17.4* 16.5  HCT 45.8 45.6 47.0 50.9 48.3  MCV 89.8 90.3 90.9 90.1 89.6  PLT 650* 611* 602* 571* 474*     Coagulation Studies: No results for input(s): LABPROT, INR in the last 72 hours.  Imaging No new brain imaging overnight     ASSESSMENT AND PLAN:68 y.o. 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  After 2 weeks, can reduce to 2 mg every 12 hours and after another 2 weeks  reduce to 2 mg once daily.  - Continue Eliquis  for A-fib -As needed Versed  for clinical seizures - Had GOC discussion with patient, wife and brother at bedside.  Patient states he would never want a feeding tube.  Also confirmed DNR. -Patient very motivated to go to acute rehab and get stronger.  Will also remove NG tube and continue to encourage p.o. diet -Recommended continuing conversations with oncology and palliative care -Discussed plan with wife at bedside and medicine team via secure chat -Neurology will sign off.  Please schedule follow-up with neurology in 4 to 6 weeks after discharge    I personally spent a total of 45 minutes in the care of the patient today including getting/reviewing separately obtained history, performing a medically appropriate exam/evaluation, counseling and educating, placing orders, referring and communicating with other health care professionals, documenting clinical information in the EHR, independently interpreting results, and coordinating care.     [facility] For questions after 5pm please refer to AMION to reach the Neurologist on call

## 2024-08-01 NOTE — Assessment & Plan Note (Addendum)
-   Neurology consulted, appreciate recommendations - Neurosurgery consulted, appreciate recommendations  - Palliative consulted, appreciate assistance in patient's care - Oncology consulted, appreciate recommendations - Repeat stat CT head if there is acute change in mental status or new neurological deficits - Keppra  1000 mg BID - Versed  prn for clinical seizures - IV Dexamethasone  2 mg Q8H (8am, 1pm, and 6pm along with other nightly medications at 6pm) > transitioning to PO with neurology's go-ahead - Taper per neurology: reduce to 2 mg q12h after 2 weeks (on 10/1), then after 2 weeks reduce to 2mg  once daily (on 10/15) - Glucose regimen: Lantus  8u at bedtime and SSI TID with meals while on steroids to maintain glucose 140-180 - Eliquis  5 mg BID - Pain regimen: acetaminophen  1000 mg q6h; DISCONTINUE prn oxycodone  5 mg q4h - Cough: Guaifenesin  15 mL q6h prn, throat spray prn - Bowel Regimen: Lactulose  10 g TID - Nausea regimen: Zofran  4 mg PRN - Continuous cardiac monitoring - Neuro checks Q4H - PT/OT following - SLP following - Fall and seizure precautions - AM Labs: BMP, Mag, Phos, CBC - PMNR order already placed for CIR readiness

## 2024-08-01 NOTE — Assessment & Plan Note (Signed)
 Urinary retention - Foley in place (failed void trail); on Flomax  0.4mg  HTN -controlled, continue home Coreg  12.5 mg BID. Holding home Losartan . Discontinued hydralazine  9/12. Systolic goal <160.  HLD - Continue home Atorvastatin  40mg  daily

## 2024-08-01 NOTE — Progress Notes (Signed)
 Wayne Molina   DOB:05-06-1956   FM#:[id number]      ASSESSMENT & PLAN:  Wayne Molina 68 y.o. male patient with newly diagnosed brain mass.  Path significant for GBM/glioblastoma multiforme.  He was admitted 9 8 and is status post brain biopsy.  NeuroOnc/Dr. [name] following.    Brain mass, Glioblastoma multiforme (GBM) - Status post right stereotactic brain biopsy 9/8.   -- Path shows high-grade glioma, WHO grade 4.   -- No treatments to start until 2-3 weeks recovery from surgery.  Oncologic therapy will be dependent upon patient's functional status.   -- Patient's tumor is in non-dominant hemisphere, mostly temporal lobe, not all that large, and he should be able to be treated if functional status improves.   -- IDH 1 and IDH 2 mutations not detected.   - Plan is for patient to follow-up with Dr. Vaslow/Neuro-Oncology as outpatient as his functional status will drive treatment options.  Discussed this with patient and his family who are agreeable to following up at [facility].  Rehab to weigh in on timing of discharge.   -- Highly recommend continue addressing nutrition.  Also physical therapy. -- Will likely need Rad Onc eval - Neuro oncology/Dr. [name] following closely.  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  Recent CT head supportive of this.  -- Oncologic treatment will be offered if patient continues to improve.   However if no significant improvement, he will not be offered any treatments.  -- Continue supportive care.   Leukocytosis -- WBC decreasing 13.5 -- Elevated WBC may be related to steroids or infectious process  -- Continue IV antibiotics as ordered --  Monitor fever curve, afebrile   Thrombocytosis   -- Platelets elevated however decreasing 474K --  No intervention at this time.   -- Continue to monitor CBC with differential    Code Status DNR-Limited  Subjective:  Patient seen awake and alert sitting up in chair at bedside.  Patient's wife and niece both at bedside.  Patient states NG tube is very uncomfortable and makes him feel full.  Patient gives that as the reason that he does not want a PEG tube.  Denies any pain.  States he is feeling better and getting stronger every day.  Denies acute complaints.   Objective:   Intake/Output Summary (Last 24 hours) at 08/01/2024 1620 Last data filed at 08/01/2024 1044 Gross per 24 hour  Intake 30 ml  Output 1150 ml  Net -1120 ml     PHYSICAL EXAMINATION: ECOG PERFORMANCE STATUS: 3 - Symptomatic, >50% confined to bed  Vitals:   08/01/24 0802 08/01/24 1209  BP: 125/76 136/80  Pulse: 76 78  Resp: 18 18  Temp: (!) 97.4 F (36.3 C) 97.7 F (36.5 C)  SpO2: 99% 98%   Filed Weights   07/23/24 0500 07/26/24 0004 07/28/24 0314  Weight: 162 lb 7.7 oz (73.7 kg) 168 lb (76.2 kg) 169 lb 12.1 oz (77 kg)    GENERAL: alert, no distress and comfortable SKIN: skin color, texture, turgor are normal, no rashes or significant lesions EYES: normal, conjunctiva are pink and non-injected, sclera clear OROPHARYNX: +NGT intact NECK: supple, thyroid  normal size, non-tender, without nodularity LYMPH: no palpable  lymphadenopathy in the cervical, axillary or inguinal LUNGS: clear to auscultation and percussion with normal breathing effort HEART: regular rate & rhythm and no murmurs and no lower extremity edema ABDOMEN: abdomen soft, non-tender and normal bowel sounds MUSCULOSKELETAL: +ambulating with walker and assist  PSYCH: alert & oriented x 3 with fluent speech  NEURO: no focal motor/sensory deficits   All questions were answered. The patient knows to call the clinic with any problems,  questions or concerns.   The total time spent in the appointment was 40 minutes encounter with patient including review of chart and various tests results, discussions about plan of care and coordination of care plan  Olam JINNY Brunner, NP 08/01/2024 4:20 PM    Labs Reviewed:  Lab Results  Component Value Date   WBC 13.5 (H) 08/01/2024   HGB 16.5 08/01/2024   HCT 48.3 08/01/2024   MCV 89.6 08/01/2024   PLT 474 (H) 08/01/2024   Recent Labs    07/10/24 0503 07/10/24 1700 07/11/24 0508 07/12/24 0337 07/24/24 0843 07/25/24 0513 07/30/24 0623 07/30/24 0852 08/01/24 0438  NA 138   < > 137   < > 135   < > 135 134* 136  K 3.8   < > 4.0   < > 4.9   < > 4.7 4.8 4.7  CL 107  --  105   < > 101   < > 98 101 100  CO2 22  --  23   < > 23   < > 28 25 27   GLUCOSE 115*  --  145*   < > 187*   < > 101* 102* 90  BUN 20  --  18   < > 29*   < > 32* 33* 40*  CREATININE 0.84  --  0.78   < > 0.76   < > 0.72 0.78 0.79  CALCIUM  8.1*  --  8.1*   < > 8.7*   < > 8.9 8.5* 8.8*  GFRNONAA >60  --  >60   < > >60   < > >60 >60 >60  PROT 5.8*  --  6.0*  --  6.5  --   --   --   --   ALBUMIN 2.7*  --  2.7*  --  2.4*  --   --   --   --   AST 32  --  29  --  40  --   --   --   --   ALT 27  --  27  --  88*  --   --   --   --   ALKPHOS 43  --  64  --  96  --   --   --   --   BILITOT 1.3*  --  1.2  --  0.9  --   --   --   --    < > = values in this interval not displayed.    Studies Reviewed:  DG Swallowing Func-Speech Pathology Result Date: 07/28/2024 Table formatting from the original result was not included. Modified Barium Swallow Study Patient Details Name: Wayne Molina MRN: [id number] Date of Birth: [birth year] Today's Date: 07/28/2024 HPI/PMH: HPI: Wayne Molina is a 68 y.o. man who presented to [facility] 8/28 with AMS. MRI large infiltrative tumor with heterogeneous enhancement tracking from the splenium of corpus callosum near midline into right temporal lobe. Course complicated by seizure; intubated 8/29-9/5.   Underwent craniotomy and biopsy 9/8 with result  of glioblastoma multiforme stage IV. Has cortrak; on dysphagia 3/thin liquids. PMHx significant for HTN, HLD, Afib on Eliquis , untreated OSA. Clinical Impression: Pt presented with a moderate oropharyngeal dysphagia with a total DIGEST score of  2. Safety grade: 2 (intermittent, not gross aspiration) and efficiency grade: 1 (less than half residue).  There was decreased oral propulsion and reduced tongue base contraction against pharyngeal wall, leading to residue in the valleculae as well as inconsistent epiglottic inversion over the larynx.  Larger, weightier volumes helped with epiglottic closure.  Aspiration of thin and nectars occurred as trace amounts of material spilled through gap between epiglottis and arytenoids, settling on the on vocal folds and ultimately passing into the trachea.  Pt had poor sensation and did not spontaneously cough in response to aspiration.  As study progressed and pt had more opportunities to swallow, airway protection improved and he had fewer incidents of aspiration by the end of the study.   Spoke with pt and his wife after the MBS - he has been on oral diet of dysphagia 3/thin liquids for ten days without adverse consequences, no pna.  Aspiration is inconsistent and swallow function actually improved as he swallowed more.  Given these factors, we decided pt should remain on PO diet.  He should continue to sit upright for PO intake and intentionally cough intermittently while eating meals.  SLP will follow closely.  D/W RN. Factors that may increase risk of adverse event in presence of aspiration Noe & Lianne 2021): Factors that may increase risk of adverse event in presence of aspiration Noe & Lianne 2021): Limited mobility Recommendations/Plan: Swallowing Evaluation Recommendations Swallowing Evaluation Recommendations Recommendations: PO diet PO Diet Recommendation: Dysphagia 3 (Mechanical soft); Thin liquids (Level 0)  Liquid Administration via: Cup; Straw Medication Administration: Via alternative means Supervision: Full assist for feeding Swallowing strategies  : Clear throat intermittently; Hard cough after swallowing Postural changes: Position pt fully upright for meals Oral care recommendations: Oral care BID (2x/day) Treatment Plan Treatment Plan Treatment recommendations: Therapy as outlined in treatment plan below Follow-up recommendations: Acute inpatient rehab (3 hours/day) Functional status assessment: Patient has had a recent decline in their functional status and demonstrates the ability to make significant improvements in function in a reasonable and predictable amount of time. Interventions: Aspiration precaution training; Patient/family education; Respiratory muscle strength training Recommendations Recommendations for follow up therapy are one component of a multi-disciplinary discharge planning process, led by the attending physician.  Recommendations may be updated based on patient status, additional functional criteria and insurance authorization. Assessment: Orofacial Exam: Orofacial Exam Oral Cavity - Dentition: Dentures, top Anatomy: Anatomy: WFL Boluses Administered: Boluses Administered Boluses Administered: Thin liquids (Level 0); Mildly thick liquids (Level 2, nectar thick); Moderately thick liquids (Level 3, honey thick); Puree; Solid  Oral Impairment Domain: Oral Impairment Domain Lip Closure: No labial escape Tongue control during bolus hold: Not tested Bolus preparation/mastication: Slow prolonged chewing/mashing with complete recollection Bolus transport/lingual motion: Slow tongue motion Oral residue: Trace residue lining oral structures Location of oral residue : Floor of mouth; Tongue; Lateral sulci Initiation of pharyngeal swallow : Valleculae  Pharyngeal Impairment Domain: Pharyngeal Impairment Domain Soft palate elevation: No bolus between soft palate (SP)/pharyngeal wall (PW) Laryngeal  elevation: Complete superior movement of thyroid  cartilage with complete approximation of arytenoids to epiglottic petiole Anterior hyoid excursion: Complete anterior movement Epiglottic movement: Partial inversion Laryngeal vestibule closure: Incomplete, narrow column air/contrast in laryngeal vestibule Pharyngeal stripping wave : Present - diminished Pharyngeal contraction (A/P view only): N/A  Pharyngoesophageal segment opening: Complete distension and complete duration, no obstruction of flow Tongue base retraction: Trace column of contrast or air between tongue base and PPW; Narrow column of contrast or air between tongue base and PPW Pharyngeal residue: Collection of residue within or on pharyngeal structures Location of pharyngeal residue: Valleculae  Esophageal Impairment Domain: Esophageal Impairment Domain Esophageal clearance upright position: Complete clearance, esophageal coating Pill: Pill Consistency administered: -- (na) Penetration/Aspiration Scale Score: Penetration/Aspiration Scale Score 1.  Material does not enter airway: Puree; Solid; Moderately thick liquids (Level 3, honey thick) 8.  Material enters airway, passes BELOW cords without attempt by patient to eject out (silent aspiration) : Thin liquids (Level 0); Mildly thick liquids (Level 2, nectar thick) Compensatory Strategies: Compensatory Strategies Compensatory strategies: Yes Straw: -- (no difference) Chin tuck: Ineffective Ineffective Chin Tuck: Mildly thick liquid (Level 2, nectar thick) Left head turn: -- (negligible difference)   General Information: Caregiver present: No  Diet Prior to this Study: Dysphagia 3 (mechanical soft); Thin liquids (Level 0)   Temperature : Normal   Respiratory Status: WFL   Supplemental O2: None (Room air)   History of Recent Intubation: No  Behavior/Cognition: Alert; Cooperative No data recorded Baseline vocal quality/speech: Dysphonic Volitional Cough: Able to elicit Volitional Swallow: Able to elicit No  data recorded Goal Planning: Prognosis for improved oropharyngeal function: Good No data recorded No data recorded Patient/Family Stated Goal: to get pt to eat more, pt with ongoing odynophagia No data recorded Pain: Pain Assessment Pain Assessment: No/denies pain Faces Pain Scale: 0 Pain Location: throat pain - odynophagia Pain Intervention(s): Monitored during session End of Session: Start Time:SLP Start Time (ACUTE ONLY): 1335 Stop Time: SLP Stop Time (ACUTE ONLY): 1410 Time Calculation:SLP Time Calculation (min) (ACUTE ONLY): 35 min Charges: SLP Evaluations $ SLP Speech Visit: 1 Visit SLP Evaluations $MBS Swallow: 1 Procedure $Swallowing Treatment: 1 Procedure $Speech Treatment for Individual: 1 Procedure SLP visit diagnosis: SLP Visit Diagnosis: Dysphagia, oropharyngeal phase (R13.12) Past Medical History: Past Medical History: Diagnosis Date  Anxiety   Arthritis   fingers  Chronic atrial fibrillation (HCC)   Depression   Family history of breast cancer   Family history of cancer of male genital organ   Family history of gene mutation   BRIP1  Family history of malignant neoplasm of gastrointestinal tract   Hyperlipidemia   Hypertension   Personal history of colonic polyps 11/12/2006  Sleep apnea  Past Surgical History: Past Surgical History: Procedure Laterality Date  2-D echocardiogram  07/22/2010  Ejection fraction greater than 55%. Left atrium moderately dilated. Right atrium moderately dilated. 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 Mild to Moderate MR. Mild to moderate TR.  APPLICATION OF CRANIAL NAVIGATION Right 07/17/2024  Procedure: COMPUTER-ASSISTED NAVIGATION, FOR CRANIAL PROCEDURE;  Surgeon: Rosslyn Dino HERO, MD;  Location: [facility] OR;  Service: Neurosurgery;  Laterality: Right;  CRANIAL NAVIGATION  ATRIAL ABLATION SURGERY  2004  EP IMPLANTABLE DEVICE N/A 02/11/2016  Procedure: Loop Recorder Insertion;  Surgeon: Jerel Balding, MD;  Location: [facility] INVASIVE CV LAB;  Service: Cardiovascular;  Laterality: N/A;  Exercise  Myoview stress test  07/08/2000  Nonischemic low-risk.  HIP FRACTURE SURGERY  1970s  IR GENERIC HISTORICAL  01/21/2017  IR RADIOLOGIST EVAL & MGMT 01/21/2017 [facility]-INTERV RAD  STERIOTACTIC STIMULATOR INSERTION Right 07/17/2024  Procedure: OPEN CRANIOTOMY FOR BIOPSY;  Surgeon: Rosslyn Dino HERO, MD;  Location: [facility] OR;  Service: Neurosurgery;  Laterality: Right;  RIGHT STEREOTACTIC BRAIN BIOPSY Vona Alan Bradford 07/28/2024, 3:37 PM Amanda L. Couture, MA CCC/SLP Clinical  Specialist - Acute Care SLP Acute Rehabilitation Services Office number [phone]  CT HEAD WO CONTRAST ( ) Result Date: 07/26/2024 EXAM: CT HEAD WITHOUT CONTRAST 07/26/2024 01:39:00 PM TECHNIQUE: CT of the head was performed without the administration of intravenous contrast. Automated exposure control, iterative reconstruction, and/or weight based adjustment of the mA/kV was utilized to reduce the radiation dose to as low as reasonably achievable. COMPARISON: CT head 07/23/2024. CLINICAL HISTORY: Follow up imaging post brain biopsy tract hemorrhage. FINDINGS: BRAIN AND VENTRICLES: Similar appearance of right parietal craniotomy for biopsy of mass lesion. There are decreased hyperdense components of the mass compared to prior, likely reflecting decreased postbiopsy blood products. Similar vasogenic edema in the right parietal lobe. Additional vasogenic edema in the posterior right temporal lobe also similar to prior. 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 Similar mass effect on the right lateral ventricle. No acute hemorrhage. No evidence of acute infarct. No hydrocephalus. No extra-axial collection. No midline shift. ORBITS: No acute abnormality. SINUSES: No acute abnormality. SOFT TISSUES AND SKULL: No acute soft tissue abnormality. No skull fracture. IMPRESSION: 1. Similar appearance of right parietal craniotomy for biopsy of mass lesion. There are decreased hyperdense components within the mass compatible with decreased post-biopsy  blood products. 2. Similar vasogenic edema and mass effect. Electronically signed by: Donnice Mania MD 07/26/2024 04:12 PM EDT RP Workstation: [id number]   CUP PACEART REMOTE DEVICE CHECK Result Date: 07/25/2024 ILR summary report received. Battery status OK. Normal device function. No new symptom, tachy, brady, or pause episodes. No new AF episodes. Presenting EGM with irregular R-R intervals and indiscernible P waves c/w hx of permanent  atrial arrhythmia, V rates are controlled, on Eliquis  per Epic. Monthly summary reports and ROV/PRN. [facility], [facility]  VAS US  LOWER EXTREMITY VENOUS (DVT) Result Date: 07/24/2024  Lower Venous DVT Study Patient Name:  Wayne Molina  Date of Exam:   07/23/2024 Medical Rec #: [id number]      Accession #:    [id number] Date of Birth: 1956-01-21       Patient Gender: M Patient Age:   62 years Exam Location:  [facility] Procedure:      VAS US  LOWER EXTREMITY VENOUS (DVT) Referring Phys: CARINA BROWN --------------------------------------------------------------------------------  Indications: Pain.  Risk Factors: Recent cancer diagnosis (GDM). Comparison Study: Previous exam (LLEV) on 08/15/2020 was negative for DVT Performing Technologist: Ezzie Potters RVT, RDMS  Examination Guidelines: A complete evaluation includes B-mode imaging, spectral Doppler, color Doppler, and power Doppler as needed of all accessible portions of each vessel. Bilateral testing is considered an integral part of a complete examination. Limited examinations for reoccurring indications may be performed as noted. The reflux portion of the exam is performed with the patient in reverse Trendelenburg.  +---------+---------------+---------+-----------+----------+--------------+ RIGHT    CompressibilityPhasicitySpontaneityPropertiesThrombus Aging +---------+---------------+---------+-----------+----------+--------------+ CFV      Full           Yes      Yes                                  +---------+---------------+---------+-----------+----------+--------------+ SFJ      Full                                                        +---------+---------------+---------+-----------+----------+--------------+ FV Prox  Full           Yes      Yes                                 +---------+---------------+---------+-----------+----------+--------------+ FV Mid   Full           Yes      Yes                                 +---------+---------------+---------+-----------+----------+--------------+ FV DistalFull           Yes      Yes                                 +---------+---------------+---------+-----------+----------+--------------+ PFV      Full                                                        +---------+---------------+---------+-----------+----------+--------------+ POP      Full           Yes      Yes                                 +---------+---------------+---------+-----------+----------+--------------+ PTV      Full                                                        +---------+---------------+---------+-----------+----------+--------------+ PERO     Full                                                        +---------+---------------+---------+-----------+----------+--------------+ Gastroc  None           No       No                   Acute          +---------+---------------+---------+-----------+----------+--------------+   +---------+---------------+---------+-----------+----------+--------------+ LEFT     CompressibilityPhasicitySpontaneityPropertiesThrombus Aging +---------+---------------+---------+-----------+----------+--------------+ CFV      Full           Yes      Yes                                 +---------+---------------+---------+-----------+----------+--------------+ SFJ      Full                                                         +---------+---------------+---------+-----------+----------+--------------+ FV Prox  Full           Yes  Yes                                 +---------+---------------+---------+-----------+----------+--------------+ FV Mid   Full           Yes      Yes                                 +---------+---------------+---------+-----------+----------+--------------+ FV DistalFull           Yes      Yes                                 +---------+---------------+---------+-----------+----------+--------------+ PFV      Full                                                        +---------+---------------+---------+-----------+----------+--------------+ POP      Full           Yes      Yes                                 +---------+---------------+---------+-----------+----------+--------------+ PTV      Full                                                        +---------+---------------+---------+-----------+----------+--------------+ PERO     Full                                                        +---------+---------------+---------+-----------+----------+--------------+ Gastroc  Full                                                        +---------+---------------+---------+-----------+----------+--------------+     Summary: BILATERAL: - No evidence of deep vein thrombosis seen in the lower extremities, bilaterally. -No evidence of popliteal cyst, bilaterally. RIGHT: Findings consistent with acute intramuscular thrombosis involving the right gastrocnemius veins.   *See table(s) above for measurements and observations. Electronically signed by Debby Robertson on 07/24/2024 at 11:33:00 AM.    Final    CT HEAD WO CONTRAST ( ) Result Date: 07/23/2024 EXAM: CT HEAD WITHOUT CONTRAST 07/23/2024 03:38:00 PM TECHNIQUE: CT of the head was performed without the administration of intravenous contrast. Automated exposure control, iterative reconstruction, and/or  weight based adjustment of the mA/kV was utilized to reduce the radiation dose to as low as reasonably achievable. COMPARISON: CT angio of head and neck 10/04/2023 CLINICAL HISTORY: Brain/CNS neoplasm, monitor. Non con. Principal Problem: GBM (glioblastoma multiforme) (HCC). Chief complaints: Weakness, Near Syncope, Code Stroke. Status post biopsy. FINDINGS: BRAIN AND VENTRICLES: Right parietal  craniotomy for biopsy of the mass is again noted. The operative bed is unchanged. More hyperdense component extending into the right posterior aspect of the corpus callosum with stable. This creates mass effect on the right lateral ventricle. Vasogenic edema in the high right parietal lobe is stable. ORBITS: No acute abnormality. SINUSES: No acute abnormality. SOFT TISSUES AND SKULL: No acute soft tissue abnormality. No skull fracture. IMPRESSION: 1. Stable right parietal operative bed and mass with more hyperdense component extending into the right posterior aspect of the corpus callosum, creating mass effect on the right lateral ventricle. 2. Stable vasogenic edema in the high right parietal lobe. Electronically signed by: Lonni Necessary MD 07/23/2024 03:49 PM EDT RP Workstation: [id number]   US  Abdomen Limited RUQ (LIVER/GB) Result Date: 07/23/2024 CLINICAL DATA:  Right upper quadrant abdominal pain EXAM: ULTRASOUND ABDOMEN LIMITED RIGHT UPPER QUADRANT COMPARISON:  None Available. FINDINGS: Gallbladder: Borderline gallbladder wall thickening may be due to nondistention. No sonographic Murphy sign noted by sonographer. Common bile duct: Diameter: 0.4 cm Liver: No focal lesion identified. Nodular liver contour raises suspicion for cirrhosis. Portal vein is patent on color Doppler imaging with normal direction of blood flow towards the liver. Other: None. IMPRESSION: 1. Nodular liver contour raises suspicion for cirrhosis. 2. Borderline gallbladder wall thickening may be due to nondistention. No sonographic Murphy  sign noted by sonographer. Electronically Signed   By: Ryan Salvage M.D.   On: 07/23/2024 15:48   Overnight EEG with video Result Date: 07/22/2024 Shelton Arlin KIDD, MD     07/23/2024  7:03 AM Patient Name: ZAYVIER CARAVELLO MRN: [id number] Epilepsy Attending: Arlin KIDD Shelton Referring Physician/Provider: Khaliqdina, Salman, MD Duration: 07/21/2024 1227 to 07/22/2024 1227  Patient history: 68 y.o. 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 EEG to evaluate for seizure  Level of alertness: awake/ lethargic , asleep  AEDs during EEG study: LEV  Technical aspects: This EEG study was done with scalp electrodes positioned according to the 10-20 International system of electrode placement. Electrical activity was reviewed with band pass filter of 1-70Hz , sensitivity of 7 uV/mm, display speed of 40mm/sec with a 60Hz  notched filter applied as appropriate. EEG data were recorded continuously and digitally stored.  Video monitoring was available and reviewed as appropriate.  Description: The posterior dominant rhythm consists of 7.5 Hz activity of moderate voltage (25-35 uV) seen predominantly in posterior head regions, asymmetric ( right<left) and reactive to eye opening and eye closing. Sleep was characterized by vertex waves, sleep spindles (12 to 14 Hz), maximal frontocentral region. EEG showed continuous 3 to 6 Hz theta-delta slowing right hemisphere, maximal right temporal region. Intermittent generalized 3-5hz  theta-delta slowing was also noted. Hyperventilation and photic stimulation were not performed.    ABNORMALITY - Continuous slow, right hemisphere - Intermittent slow, generalized  IMPRESSION: This study is suggestive of cortical dysfunction arising from right hemisphere likely due to underlying structural abnormality. Additionally there is mild to moderate diffuse encephalopathy. No definite seizures were seen throughout the  recording.   Arlin KIDD Shelton   DG CHEST PORT 1 VIEW Result Date: 07/21/2024 CLINICAL DATA:  68 year old male with cough.  Grade 4 Glioma. EXAM: PORTABLE CHEST 1 VIEW COMPARISON:  Portable chest 07/17/2024 and earlier. FINDINGS: Portable AP semi upright view at 0656 hours. Stable cardiomegaly and mediastinal contours. Stable left chest superficial cardiac device. Enteric feeding tube courses to the abdomen, tip not included. Lung volumes  are within normal limits. Allowing for portable technique the lungs are clear. Visualized tracheal air column is within normal limits. Stable visualized osseous structures. IMPRESSION: No acute cardiopulmonary abnormality. Electronically Signed   By: VEAR Hurst M.D.   On: 07/21/2024 10:48   CT ANGIO HEAD NECK W WO CM Result Date: 07/21/2024 CLINICAL DATA:  Initial evaluation for acute neuro deficit, stroke. EXAM: CT ANGIOGRAPHY HEAD AND NECK TECHNIQUE: Multidetector CT imaging of the head and neck was performed using the standard protocol during bolus administration of intravenous contrast. Multiplanar CT image reconstructions and MIPs were obtained to evaluate the vascular anatomy. Carotid stenosis measurements (when applicable) are obtained utilizing NASCET criteria, using the distal internal carotid diameter as the denominator. RADIATION DOSE REDUCTION: This exam was performed according to the departmental dose-optimization program which includes automated exposure control, adjustment of the mA and/or kV according to patient size and/or use of iterative reconstruction technique. CONTRAST:  75mL OMNIPAQUE  IOHEXOL  350 MG/ML SOLN COMPARISON:  Prior CT from earlier the same day. FINDINGS: CT HEAD FINDINGS Brain: Examination degraded by motion artifact. Patient's right cerebral mass with superimposed hemorrhage again noted, not significantly changed. No new finding from CT performed earlier the same day. Vascular: No visible hyperdense vessel. Skull: No visible new finding.  Sinuses/Orbits: No visible new finding. Other: None. Review of the MIP images confirms the above findings CTA NECK FINDINGS Aortic arch: Aortic arch and origin of the great vessels not visualized on this exam. Atheromatous change about the visualized proximal great vessels noted. No visible stenosis. Right carotid system: Right common and internal carotid arteries are patent without stenosis or dissection. Left carotid system: Left common and internal carotid arteries are patent without dissection. Eccentric calcified plaque at the left carotid bulb with estimated 50% stenosis, stable. 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 Distal reconstitution at the V3 segment via muscular collaterals, stable. Vertebral arteries remain patent as they course into the cranial vault. No visible stenosis or dissection. Skeleton: No worrisome osseous lesions.  Patient is edentulous. Other neck: No other acute finding. Upper chest: No other acute finding. Review of the MIP images confirms the above findings CTA HEAD FINDINGS Anterior circulation: Both internal carotid arteries are patent to the termini without stenosis. A1 segments patent bilaterally. Right A1 hypoplastic. Normal anterior communicating artery complex. Anterior cerebral arteries patent without stenosis. Atheromatous plaque at the right ICA terminus/proximal right M1 segment with short-segment mild-to-moderate stenosis (series 10, image 111). Left M1 segment widely patent. No proximal MCA branch occlusion. Distal MCA branches perfused and symmetric. Posterior circulation: Right V4 segment patent without stenosis. Right PICA not seen. Left vertebral artery patent to the takeoff of the left PICA, with subsequent reocclusion. Left PICA patent. Basilar diminutive but patent without stenosis. Superior cerebellar and posterior cerebral arteries remain patent bilaterally. Venous sinuses: Grossly  patent allowing for timing the contrast bolus. Anatomic variants: As above.  No aneurysm. Review of the MIP images confirms the above findings IMPRESSION: 1. Stable CTA of the head and neck. No large vessel occlusion or other emergent finding. 2. 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 Left V4 segment reoccludes beyond the takeoff of the left PICA. 3. Atheromatous plaque at the right ICA terminus/proximal right M1 segment with short-segment mild-to-moderate stenosis. 4. Atheromatous plaque at the left carotid bulb with estimated 50% stenosis, stable. 5. Right cerebral mass with superimposed hemorrhage,  not significantly changed from CT performed earlier the same day. Electronically Signed   By: Morene Hoard M.D.   On: 07/21/2024 00:03   CT Head Wo Contrast Result Date: 07/20/2024 CLINICAL DATA:  Initial evaluation for acute neuro deficit, stroke suspected. EXAM: CT HEAD WITHOUT CONTRAST TECHNIQUE: Contiguous axial images were obtained from the base of the skull through the vertex without intravenous contrast. RADIATION DOSE REDUCTION: This exam was performed according to the departmental dose-optimization program which includes automated exposure control, adjustment of the mA and/or kV according to patient size and/or use of iterative reconstruction technique. COMPARISON:  Prior CT from 07/18/2024. FINDINGS: Brain: Postoperative changes from prior right temporal craniotomy for tumor biopsy again noted. Postoperative pneumocephalus has resolved. Previously identified infiltrating mass within the right cerebral hemisphere again seen, grossly stable. Focus of hemorrhage within the mass itself has slightly contracted in the interim, now measuring 1.6 x 1.1 x 1.3 cm, previously 1.9 x 1.2 x 1.5 cm. Trace residual hemorrhage along the biopsy tract noted as well, decreased. Similar surrounding vasogenic edema and  regional mass effect without significant midline shift. No other new acute intracranial hemorrhage. No visible acute large vessel territory infarct. No other mass lesion or mass effect. No hydrocephalus. No significant extra-axial fluid collection. Vascular: No abnormal hyperdense vessel. Calcified atherosclerosis present at the skull base. Skull: Post craniotomy changes on the right. Skin staples remain in place. Sinuses/Orbits: Globes and orbital soft tissues demonstrate no acute finding. Paranasal sinuses and mastoid air cells remain largely clear. Other: Nasogastric tube in place. IMPRESSION: 1. Postoperative changes from prior right temporal craniotomy for tumor biopsy. Previously identified infiltrating mass within the right cerebral hemisphere is grossly stable. Focus of hemorrhage within the mass itself has slightly contracted in the interim, now measuring 1.6 x 1.1 x 1.3 cm, previously 1.9 x 1.2 x 1.5 cm. Trace residual hemorrhage along the biopsy tract has decreased as well. Similar surrounding vasogenic edema and regional mass effect without significant midline shift. 2. No other new acute intracranial abnormality. Electronically Signed   By: Morene Hoard M.D.   On: 07/20/2024 21:01   CT HEAD WO CONTRAST Result Date: 07/18/2024 CLINICAL DATA:  Initial postoperative evaluation. EXAM: CT HEAD WITHOUT CONTRAST TECHNIQUE: Contiguous axial images were obtained from the base of the skull through the vertex without intravenous contrast. RADIATION DOSE REDUCTION: This exam was performed according to the departmental dose-optimization program which includes automated exposure control, adjustment of the mA and/or kV according to patient size and/or use of iterative reconstruction technique. COMPARISON:  Comparison made with prior CT from 07/08/2024 as well as previous exams. FINDINGS: Brain: Postoperative changes from interval right temporal craniotomy for presumed biopsy of previously identified  infiltrating right cerebral mass. Small amount of postoperative pneumocephalus overlies the right cerebral convexity. Focus of hemorrhage measuring up to 1.4 cm seen along the biopsy tract at the posterior right temporal lobe (series 2, image 15). Additional focus of hemorrhage within the resection cavity itself measures 1.9 x 1.2 x 1.5 cm (series 2, image 18). No other complicating features. The underlying infiltrating mass involving the right temporal lobe with extension into the right splenium is otherwise grossly stable. Surrounding vasogenic edema with mass effect on the posterior right lateral ventricle, stable. No midline shift. Basilar cisterns remain patent. Remainder of the brain is otherwise stable. No other acute intracranial hemorrhage. No acute large vessel territory infarct. No other mass lesion or mass effect. No hydrocephalus or extra-axial fluid collection. Vascular: No abnormal hyperdense vessel. Calcified  atherosclerosis present at the skull base. Skull: Post craniotomy changes on the right. Skin staples in subgaleal drain remain in place. No adverse features. Sinuses/Orbits: Globes orbital soft tissues demonstrate no acute finding. Paranasal sinuses are largely clear. Small right with trace left mastoid effusions, of doubtful significance. Nasogastric tube in place. Other: None. IMPRESSION: 1. Postoperative changes from interval right temporal craniotomy for biopsy of patient's known right cerebral mass. Small volume postoperative hemorrhage along the biopsy tract and within the resection cavity as above. No other complicating features. The underlying right cerebral mass is otherwise grossly stable. 2. No other new acute intracranial abnormality. Electronically Signed   By: Morene Hoard M.D.   On: 07/18/2024 02:12   DG CHEST PORT 1 VIEW Result Date: 07/17/2024 CLINICAL DATA:  [id number] Respiratory failure (HCC) 33498. EXAM: PORTABLE CHEST 1 VIEW COMPARISON:  07/13/2024. FINDINGS: There is  nonspecific heterogeneous opacity overlying the lateral aspect of the right mid lung zone, which was likely present on the prior radiograph. This is indeterminate. Bilateral lung fields are otherwise clear. No acute consolidation or lung collapse. Bilateral costophrenic angles are clear. Stable cardio-mediastinal silhouette. Presumed loop recorder device noted overlying the left lower chest. No acute osseous abnormalities. The soft tissues are within normal limits. Enteric tube is seen coursing below the left hemidiaphragm however, the tip and side hole are not included in the film. IMPRESSION: *No acute cardiopulmonary abnormality. Nonspecific heterogeneous opacity overlying the lateral aspect of the right mid lung zone, which was likely present on the prior radiograph. This is indeterminate. Correlate clinically to determine the need for further evaluation with nonemergent chest CT scan. Electronically Signed   By: Ree Molt M.D.   On: 07/17/2024 08:18   DG Chest Port 1 View Result Date: 07/13/2024 CLINICAL DATA:  Respiratory distress. EXAM: PORTABLE CHEST 1 VIEW COMPARISON:  Chest radiograph dated 07/11/2024 FINDINGS: Enteric tube extends below the diaphragm with tip beyond the inferior margin of the image. Right-sided PICC with tip over central SVC. There is cardiomegaly with vascular congestion and edema, significantly progressed since the prior radiograph. Pneumonia is not excluded. A small left pleural effusion may be present. No pneumothorax. A loop recorder device. No acute osseous pathology. IMPRESSION: Cardiomegaly with findings of CHF, significantly progressed since the prior radiograph. Pneumonia is not excluded. Electronically Signed   By: Vanetta Chou M.D.   On: 07/13/2024 19:24   DG Chest Port 1 View Result Date: 07/11/2024 CLINICAL DATA:  [id number].  Pneumonia. EXAM: PORTABLE CHEST 1 VIEW COMPARISON:  Portable chest yesterday at 5:05 p.m. FINDINGS: 5:48 a.m. ETT tip is 3.6 cm from the  carina. NGT enters the stomach with the intragastric course not fully seen. Right PICC tip approaches the superior cavoatrial junction. There is a left chest loop recorder device. Stable cardiomegaly. There is mild perihilar vascular prominence but no overt edema. There are small layering pleural effusions, asymmetric consolidation left lower lobe. Remaining lungs appear clear.  No new or worsening findings. Stable mediastinum with calcification in the transverse aorta. Stable osseous structures. IMPRESSION: 1. No significant change from yesterday's study. 2. Stable cardiomegaly and mild perihilar vascular prominence. 3. Small layering pleural effusions and left lower lobe consolidation. Electronically Signed   By: Francis Quam M.D.   On: 07/11/2024 07:52   DG CHEST PORT 1 VIEW Result Date: 07/10/2024 CLINICAL DATA:  Central line placement. Clinical notes confirm venous placement. EXAM: PORTABLE CHEST 1 VIEW COMPARISON:  Earlier today FINDINGS: Unchanged positioning of right upper extremity PICC, the tip  projects over the mid mediastinum to the left of midline. Endotracheal tube tip 3.7 cm from the carina. Enteric tube tip below the diaphragm cardiomegaly is unchanged. Left chest wall loop recorder. Unchanged left lung base opacity and pleural effusion. No pneumothorax or new airspace disease. IMPRESSION: 1. Unchanged positioning of right upper extremity PICC, the tip projects over the mid mediastinum to the left of midline. Given confirmation of venous placement clinically, this is in the region of the mid SVC. 2. Unchanged left lung base opacity and pleural effusion. Electronically Signed   By: Andrea Gasman M.D.   On: 07/10/2024 20:41   DG CHEST PORT 1 VIEW Result Date: 07/10/2024 CLINICAL DATA:  [id number] S/P PICC central line placement [id number] EXAM: PORTABLE CHEST 1 VIEW COMPARISON:  Chest x-ray 07/10/2024 FINDINGS: Endotracheal tube terminates 4 cm above the carina. Right PICC with tip overlying mid  mediastinum just inferior to the carina. Enteric tube courses below the hemidiaphragm with tip and side port collimated off view. Wireless cardiac device overlies the left chest. The heart and mediastinal contours are unchanged. Persistent left lower lung zone opacity. No pulmonary edema. No right pleural effusion. Possible left pleural effusion. No pneumothorax. No acute osseous abnormality. IMPRESSION: 1. Right PICC with tip overlying mid mediastinum just inferior to the carina. Question malpositioning/arterial positioning. Consider repeat chest x-ray and possible use of CT for further evaluation. 2. Persistent left lower lung zone opacity. Possible left pleural effusion. These results will be called to the ordering clinician or representative by the Radiologist Assistant, and communication documented in the PACS or [employer]. Electronically Signed   By: Morgane  Naveau M.D.   On: 07/10/2024 16:00   US  EKG SITE RITE Result Date: 07/10/2024 If Site Rite image not attached, placement could not be confirmed due to current cardiac rhythm.  DG Chest Port 1 View Result Date: 07/10/2024 CLINICAL DATA:  Interest per dependence, endotracheal tube EXAM: PORTABLE CHEST 1 VIEW COMPARISON:  July 07, 2024 chest radiograph FINDINGS: Endotracheal tube in place. Partially imaged enteric tube. Unchanged cardiac loop recorder projecting over the left chest wall. Bibasilar opacities, slightly increased from prior and may represent atelectasis. Unchanged cardiomediastinal silhouette. IMPRESSION: Unchanged support devices. Slightly increased bibasilar opacities, possibly representing atelectasis. Electronically Signed   By: Michaeline Blanch M.D.   On: 07/10/2024 11:44   Overnight EEG with video Result Date: 07/08/2024 Shelton Arlin KIDD, MD     07/09/2024  7:10 AM Patient Name: Wayne Molina MRN: [id number] Epilepsy Attending: Arlin KIDD Shelton Referring Physician/Provider: Khaliqdina, Salman, MD Duration: 07/07/2024 9070 to  07/08/2024 9070 Patient history: 68 y.o. 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 EEG to evaluate for seizure Level of alertness: comatose/ lethargic AEDs during EEG study: LEV, propofol , Versed ,VPA Technical aspects: This EEG study was done with scalp electrodes positioned according to the 10-20 International system of electrode placement. Electrical activity was reviewed with band pass filter of 1-70Hz , sensitivity of 7 uV/mm, display speed of 31mm/sec with a 60Hz  notched filter applied as appropriate. EEG data were recorded continuously and digitally stored.  Video monitoring was available and reviewed as appropriate. Description: EEG showed continuous generalized and lateralized right hemisphere 3 to 6 Hz theta-delta slowing admixed with 13-15hz  beta activity. At the beginning of the study, EEG also showed lateralized periodic discharges ( LPDs) with overriding fast activity at 1-1.5Hz  in right hemisphere. As sedation was adjusted, overriding fast activity  improved Hyperventilation and photic stimulation were not performed.   ABNORMALITY - Lateralized periodic discharges with overriding fast activity ( LPD +F)  right hemisphere - Continuous slow, generalized and lateralized right hemisphere IMPRESSION: This study showed evidence of epileptogenicity and cortical dysfunction arising from right hemisphere likely due to underlying structural abnormality. This eeg pattern is on the ictal-interictal continuum with high risk of seizure recurrence. Additionally there is severe diffuse encephalopathy, likely related to sedation. No seizures were seen throughout the recording. Priyanka MALVA Krebs   CT HEAD WO CONTRAST ( ) Result Date: 07/08/2024 CLINICAL DATA:  Initial evaluation for acute mental status change, unknown cause. EXAM: CT HEAD WITHOUT CONTRAST TECHNIQUE: Contiguous axial images were obtained from the base of the skull through  the vertex without intravenous contrast. RADIATION DOSE REDUCTION: This exam was performed according to the departmental dose-optimization program which includes automated exposure control, adjustment of the mA and/or kV according to patient size and/or use of iterative reconstruction technique. COMPARISON:  Comparison made with prior studies from 07/07/2024. FINDINGS: Brain: Examination somewhat technically limited by streak artifact from overlying EEG leads. Previously identified infiltrating mass involving the right temporal lobe and posterior right splenium again seen, relatively stable from prior. Surrounding vasogenic edema, also unchanged. No acute intracranial hemorrhage. No other acute large vessel territory infarct. No other mass lesion or mass effect. No hydrocephalus or extra-axial fluid collection. Few small chronic cerebellar infarcts again noted. Vascular: No abnormal hyperdense vessel. Calcified atherosclerosis present at skull base. Skull: No acute finding.  EEG leads overlie the scalp. Sinuses/Orbits: Globes and orbital soft tissues within normal limits. Paranasal sinuses and mastoid air cells remain largely clear. Other: Endotracheal tube partially visualized. IMPRESSION: 1. Stable head CT with infiltrating mass involving the posterior right temporal lobe and splenium with surrounding vasogenic edema. 2. No other new acute intracranial abnormality. Electronically Signed   By: Morene Hoard M.D.   On: 07/08/2024 02:47   ECHOCARDIOGRAM COMPLETE Result Date: 07/07/2024    ECHOCARDIOGRAM REPORT   Patient Name:   Wayne Molina Date of Exam: 07/07/2024 Medical Rec #:  [id number]     Height:       69.0 in Accession #:    [id number]    Weight:       163.0 lb Date of Birth:  19-Sep-1956      BSA:          1.894 m Patient Age:    68 years      BP:           104/66 mmHg Patient Gender: M             HR:           69 bpm. Exam Location:  Inpatient Procedure: 2D Echo, Color Doppler and Cardiac Doppler  (Both Spectral and Color            Flow Doppler were utilized during procedure). Indications:    Stroke  History:        Patient has prior history of Echocardiogram examinations, most                 recent 06/11/2022.  Sonographer:    Tinnie Rgers RDCS Referring Phys: [id number] COURTNEY F HORTON IMPRESSIONS  1. Left ventricular ejection fraction, by estimation, is 60 to 65%. Left ventricular ejection fraction by PLAX is 61 %. The left ventricle has normal function. The left ventricle has no regional wall motion abnormalities. There is moderate left ventricular hypertrophy. Left ventricular diastolic function  could not be evaluated.  2. Right ventricular systolic function is moderately reduced. The right ventricular size is mildly enlarged.  3. Left atrial size was moderately dilated.  4. Right atrial size was severely dilated.  5. The mitral valve is grossly normal. Trivial mitral valve regurgitation.  6. The aortic valve is calcified. Aortic valve regurgitation is mild. Aortic valve sclerosis/calcification is present, without any evidence of aortic stenosis.  7. The inferior vena cava is dilated in size with <50% respiratory variability, suggesting right atrial pressure of 15 mmHg.  8. Cannot exclude a small PFO.  9. Rhythm strip during this exam demonstrates atrial fibrillation. Comparison(s): Changes from prior study are noted. 03/15/2024: LVEF 65-70%, mildly dilated RV with normal systolic function. FINDINGS  Left Ventricle: Left ventricular ejection fraction, by estimation, is 60 to 65%. Left ventricular ejection fraction by PLAX is 61 %. The left ventricle has normal function. The left ventricle has no regional wall motion abnormalities. The left ventricular internal cavity size was normal in size. There is moderate left ventricular hypertrophy. Left ventricular diastolic function could not be evaluated due to atrial fibrillation. Left ventricular diastolic function could not be evaluated. Right Ventricle: The  right ventricular size is mildly enlarged. No increase in right ventricular wall thickness. Right ventricular systolic function is moderately reduced. Left Atrium: Left atrial size was moderately dilated. Right Atrium: Right atrial size was severely dilated. Pericardium: There is no evidence of pericardial effusion. Mitral Valve: The mitral valve is grossly normal. Trivial mitral valve regurgitation. Tricuspid Valve: The tricuspid valve is grossly normal. Tricuspid valve regurgitation is trivial. Aortic Valve: The aortic valve is calcified. Aortic valve regurgitation is mild. Aortic valve sclerosis/calcification is present, without any evidence of aortic stenosis. Pulmonic Valve: The pulmonic valve was normal in structure. Pulmonic valve regurgitation is not visualized. Aorta: The aortic root and ascending aorta are structurally normal, with no evidence of dilitation. Venous: The inferior vena cava is dilated in size with less than 50% respiratory variability, suggesting right atrial pressure of 15 mmHg. IAS/Shunts: Cannot exclude a small PFO. EKG: Rhythm strip during this exam demonstrates atrial fibrillation.  LEFT VENTRICLE PLAX 2D LV EF:         Left            Diastology                ventricular     LV e' lateral: 14.10 cm/s                ejection                fraction by                PLAX is 61                %. LVIDd:         4.27 cm LVIDs:         2.88 cm LV PW:         1.29 cm LV IVS:        1.24 cm LVOT diam:     2.00 cm LV SV:         44 LV SV Index:   23 LVOT Area:     3.14 cm  RIGHT VENTRICLE            IVC RV S prime:     6.66 cm/s  IVC diam: 2.23 cm TAPSE (M-mode): 1.3 cm LEFT ATRIUM  Index        RIGHT ATRIUM           Index LA diam:        4.67 cm 2.47 cm/m   RA Area:     24.70 cm LA Vol (A2C):   73.9 ml 39.02 ml/m  RA Volume:   73.70 ml  38.91 ml/m LA Vol (A4C):   72.9 ml 38.49 ml/m LA Biplane Vol: 76.0 ml 40.13 ml/m  AORTIC VALVE LVOT Vmax:   76.60 cm/s LVOT Vmean:  47.000  cm/s LVOT VTI:    0.140 m  AORTA Ao Root diam: 3.04 cm Ao Asc diam:  2.92 cm  SHUNTS Systemic VTI:  0.14 m Systemic Diam: 2.00 cm Vinie Maxcy MD Electronically signed by Vinie Maxcy MD Signature Date/Time: 07/07/2024/4:18:11 PM    Final    DG Chest Portable 1 View Result Date: 07/07/2024 CLINICAL DATA:  Intubation. EXAM: PORTABLE CHEST 1 VIEW COMPARISON:  August 08, 2022. FINDINGS: Stable cardiomegaly. Endotracheal and nasogastric tubes are in grossly good position. Lungs are clear. Bony thorax is unremarkable. IMPRESSION: Endotracheal and nasogastric tubes are in grossly good position. Electronically Signed   By: Lynwood Landy Raddle M.D.   On: 07/07/2024 13:25   DG Abd Portable 1 View Result Date: 07/07/2024 CLINICAL DATA:  Nasogastric tube placement. EXAM: PORTABLE ABDOMEN - 1 VIEW COMPARISON:  None Available. FINDINGS: Two images of the abdomen were obtained. On the second image, nasogastric tube tip is seen in expected position of proximal stomach. IMPRESSION: Nasogastric tube tip is seen in expected position of proximal stomach. Electronically Signed   By: Lynwood Landy Raddle M.D.   On: 07/07/2024 13:23   MR BRAIN W WO CONTRAST Result Date: 07/07/2024 CLINICAL DATA:  68 year old male with new onset seizure. Code stroke presentation. EXAM: MRI HEAD WITHOUT AND WITH CONTRAST TECHNIQUE: Multiplanar, multiecho pulse sequences of the brain and surrounding structures were obtained without and with intravenous contrast. CONTRAST:  7.5mL GADAVIST  GADOBUTROL  1 MMOL/ML IV SOLN COMPARISON:  Head CTs and CTA earlier today. FINDINGS: Study is mildly degraded by motion artifact despite repeated imaging attempts. Brain: Nodular, heterogeneously enhancing, and elongated tumor in the posterior right hemisphere tracks from the splenium of the corpus callosum near midline laterally into the right temporal lobe. Enhancing component of tumor encompasses an area of about 3 cm by close to 6 cm x 3.3 cm (AP by transverse by  CC). Surrounding confluent T2 and FLAIR hyperintensity. Mild hemosiderin or mineralization on T2*. Abnormal diffusion which appears to be on the basis of hypercellularity, with mildly diminished T2 hyperintensity in those areas. Confluent periventricular involvement along the posterior right lateral ventricle, atrium. No contralateral or additional abnormal enhancement identified. No dural thickening identified. No midline shift or significant intracranial mass effect at this time. Normal basilar cisterns. No superimposed restricted diffusion suggestive of acute infarction. No ventriculomegaly, extra-axial collection or acute intracranial hemorrhage. Cervicomedullary junction and pituitary are within normal limits. 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 No other hemosiderin identified. No cortical encephalomalacia identified. Coronal thin slice imaging. Tumoral edema adjacent to the right hippocampal formation which otherwise appears within normal limits. Other mesial temporal lobe structures within normal limits. Vascular: Major intracranial vascular flow voids appear preserved. Following contrast the major dural venous sinuses are enhancing and appear to be patent. Skull and upper cervical spine: Negative. Visualized bone marrow signal is within normal limits. Sinuses/Orbits: Leftward gaze, otherwise negative. Paranasal Visualized paranasal sinuses and mastoids are stable  and well aerated. Other: Visible internal auditory structures appear normal. Scalp EEG leads in place. IMPRESSION: 1. Positive for relatively large and infiltrative tumor with heterogeneous enhancement tracking from the splenium of the corpus callosum near midline into the right temporal lobe. Confluent regional tumoral edema. No midline shift or significant intracranial mass effect. Top differential considerations are Glioblastoma and CNS Lymphoma. 2. No other acute intracranial abnormality, with  underlying small chronic cerebellar infarcts. Electronically Signed   By: VEAR Hurst M.D.   On: 07/07/2024 08:53   CT HEAD WO CONTRAST ( ) Result Date: 07/07/2024 CLINICAL DATA:  Initial evaluation for acute mental status change, unknown cause. EXAM: CT HEAD WITHOUT CONTRAST TECHNIQUE: Contiguous axial images were obtained from the base of the skull through the vertex without intravenous contrast. RADIATION DOSE REDUCTION: This exam was performed according to the departmental dose-optimization program which includes automated exposure control, adjustment of the mA and/or kV according to patient size and/or use of iterative reconstruction technique. COMPARISON:  Comparison made with CTs from earlier the same day. FINDINGS: Brain: IV contrast materials on board related to CTA performed earlier the same evening. Now evident is a probable enhancing intra-axial mass involving the posterior right frontotemporal region, in the region of previously suspected right MCA infarct. The mass is enhancing and appears bilobed. Dominant component at the right temporal region measures 2.9 cm (series 5, image 15). Additional dominant component partially extending towards the midline at the splenium measures up to 3.7 cm (series 5, image 18). Surrounding hypodensity therefore likely reflects vasogenic edema. Mild mass effect on the adjacent right lateral ventricle without midline shift. No other visible mass lesion or abnormal enhancement. No acute intracranial hemorrhage. No acute large vessel territory infarct. No extra-axial fluid collection. Suspected small remote right cerebellar infarct noted. Vascular: IV contrast material seen throughout the intracranial vasculature. Calcified atherosclerosis present at skull base. Skull: Scalp soft tissues and calvarium demonstrate no new finding. Sinuses/Orbits: Globes orbital soft tissues demonstrate no acute finding. Paranasal sinuses and mastoid air cells remain clear. Other: None.  IMPRESSION: 1. Now evident is an enhancing intra-axial mass involving the posterior right frontotemporal region, in the region of previously suspected right MCA infarct. The enhancing mass is bilobed, with dominant components measuring 2.9 cm and 3.7 cm at the right temporal lobe and splenium respectively. Surrounding vasogenic edema without midline shift. 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 Further evaluation with dedicated MRI, with and without contrast, recommended. 2. No other acute intracranial abnormality. Case briefly discussed by telephone at the time of interpretation on 07/07/2024 at 1:35 a.m. to provider [facility]. Electronically Signed   By: Morene Hoard M.D.   On: 07/07/2024 01:46   CT ANGIO HEAD NECK W WO CM (CODE STROKE) Result Date: 07/07/2024 CLINICAL DATA:  Initial evaluation for acute neuro deficit, stroke. EXAM: CT ANGIOGRAPHY HEAD AND NECK WITH AND WITHOUT CONTRAST TECHNIQUE: Multidetector CT imaging of the head and neck was performed using the standard protocol during bolus administration of intravenous contrast. Multiplanar CT image reconstructions and MIPs were obtained to evaluate the vascular anatomy. Carotid stenosis measurements (when applicable) are obtained utilizing NASCET criteria, using the distal internal carotid diameter as the denominator. RADIATION DOSE REDUCTION: This exam was performed according to the departmental dose-optimization program which includes automated exposure control, adjustment of the mA and/or kV according to patient size and/or use of iterative reconstruction technique. CONTRAST:  75mL OMNIPAQUE  IOHEXOL  350 MG/ML SOLN COMPARISON:  CT from earlier the same day. FINDINGS:  CTA NECK FINDINGS Aortic arch: Aortic arch within normal limits for caliber standard branch pattern. Aortic atherosclerosis. No significant stenosis about the origin the great vessels. Right carotid system: No evidence of dissection, stenosis (50%  or greater), or occlusion. Left carotid system: Left common and internal carotid arteries are patent without dissection. Mixed plaque about the left carotid bulb with estimated 50% stenosis by NASCET criteria. 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 Distal reconstitution at the V3 segments via muscular collaterals, greater on the right (series 5, image 165, 160). Vertebral arteries are patent as they course into the cranial vault. No visible stenosis or dissection. Skeleton: No worrisome osseous lesions.  Patient is edentulous. Other neck: No other acute finding. Upper chest: No other acute finding. Review of the MIP images confirms the above findings CTA HEAD FINDINGS Anterior circulation: Both internal carotid arteries are patent through the siphons without stenosis. A1 segments patent bilaterally. Right A1 hypoplastic. Normal anterior communicating artery complex. Anterior cerebral arteries patent without stenosis. No M1 stenosis or occlusion. No proximal MCA branch occlusion. Distal MCA branches perfused and fairly symmetric. Posterior circulation: Right V4 segment patent without stenosis. Left V4 segment patent proximally to the takeoff of the left PICA, but largely acute lose distally to the vertebrobasilar junction. Left PICA patent. Right PICA not well seen. Basilar diminutive but patent without stenosis. Superior cerebral arteries patent bilaterally. Left PCA supplied via the basilar. Right PCA supplied via the basilar as well as a robust right posterior communicating artery. Both PCAs patent to their distal aspects without significant stenosis. Venous sinuses: Grossly patent allowing for timing the contrast bolus. Anatomic variants: As above.  No aneurysm. Review of the MIP images confirms the above findings IMPRESSION: 1. Negative CTA for acute large vessel occlusion or other emergent finding. 2. 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 Distal left V4 segment subsequently reoccludes beyond the takeoff of the left PICA. 3. Atheromatous change about the left carotid bulb with estimated 50% stenosis by NASCET criteria. Aortic Atherosclerosis (ICD10-I70.0). Electronically Signed   By: Morene Hoard M.D.   On: 07/07/2024 00:50   CT HEAD CODE STROKE WO CONTRAST Result Date: 07/07/2024 CLINICAL DATA:  Code stroke. Initial evaluation for acute neuro deficit, stroke. No other relevant history provided. EXAM: CT HEAD WITHOUT CONTRAST TECHNIQUE: Contiguous axial images were obtained from the base of the skull through the vertex without intravenous contrast. RADIATION DOSE REDUCTION: This exam was performed according to the departmental dose-optimization program which includes automated exposure control, adjustment of the mA and/or kV according to patient size and/or use of iterative reconstruction technique. COMPARISON:  None Available. FINDINGS: Brain: Cerebral volume within normal limits. 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 No associated hemorrhage. No significant mass effect. Gray-white matter differentiation otherwise maintained. No mass lesion or midline shift. No hydrocephalus or extra-axial fluid collection. Vascular: No convincing hyperdense vessel by CT. Scattered vascular calcifications noted within the carotid siphons. Skull: Scalp soft tissues within normal limits.  Calvarium intact. Sinuses/Orbits: Globes orbital soft tissues within normal limits. Paranasal sinuses and mastoid air cells are largely clear. Other: None. ASPECTS [facility] Stroke Program Early CT Score) - Ganglionic level infarction (caudate, lentiform nuclei, internal capsule, insula, M1-M3 cortex): 6 - Supraganglionic infarction (M4-M6 cortex): 2 Total score (0-10 with 10 being normal): 8  IMPRESSION: 1. Evolving acute  to early subacute right MCA distribution infarct involving the posterior right frontotemporal region. No associated hemorrhage or significant mass effect. 2. Aspects is 8. These results were communicated to Dr. Vanessa at 12:18 am on 07/07/2024 by text page via the [facility] messaging system. Electronically Signed   By: Morene Hoard M.D.   On: 07/07/2024 00:19

## 2024-08-01 NOTE — Assessment & Plan Note (Signed)
 5 beat run of VT last night, asymptomatic. Will continue to monitor. - Coreg  12.5 mg twice daily; consider increased dose if sustained run of Vtach - Eliquis  5 mg BID

## 2024-08-01 NOTE — Progress Notes (Signed)
 Physical Therapy Treatment Patient Details Name: Wayne Molina MRN: [id number] DOB: 1955-12-14 Today's Date: 08/01/2024   History of Present Illness Pt is a 68 y/o male presenting on 8/28 with L arm weakness, L hemianopsia, and slurred speech. 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 Course complicated by seizure episode. Intubated 8/29, extubated 9/4 but still with difficulty managing secretions.  Biopsy on 9/8. +DVT R LE 9/14.  PMH includes: anxiety, HLD, HTN, sleep apnea, chronic afib.    PT Comments  Pt received in supine and agreeable to session. Pt demonstrates good progress towards functional mobility goals this session. Pt demonstrates improved sitting and standing balance and is able to progress gait distance with RW support. Pt reports tactile cues are helpful during ambulation and demonstrates slight improvement in step length. Patient will benefit from intensive inpatient follow-up therapy, >3 hours/day to maximize progress towards functional mobility goals. Acutely, pt continues to benefit from PT services to progress toward functional mobility goals.     If plan is discharge home, recommend the following: Two people to help with walking and/or transfers;Two people to help with bathing/dressing/bathroom;Assistance with cooking/housework;Assistance with feeding;Direct supervision/assist for medications management;Assist for transportation;Direct supervision/assist for financial management;Supervision due to cognitive status   Can travel by Doctor, hospital (measurements PT);Wheelchair cushion (measurements PT);Hospital bed;Hoyer lift    Recommendations for Other Services       Precautions / Restrictions Precautions Precautions: Fall;Other (comment) Recall of Precautions/Restrictions: Impaired Precaution/Restrictions Comments: delirium prevention;  Cortrak; SBP <160 Restrictions Weight Bearing Restrictions Per Provider Order: No     Mobility  Bed Mobility Overal bed mobility: Needs Assistance Bed Mobility: Supine to Sit     Supine to sit: Min assist, HOB elevated, Used rails     General bed mobility comments: cues for sequencing and min A for trunk elevation    Transfers Overall transfer level: Needs assistance Equipment used: Rolling walker (2 wheels) Transfers: Sit to/from Stand Sit to Stand: Min assist, +2 safety/equipment           General transfer comment: From EOB x2 with cues for hand placement and min A for anterior weight shift. Initial posterior bias and LOB with pt sitting back to EOB, cues for anterior weight shift during second trial with improvement noted.    Ambulation/Gait Ambulation/Gait assistance: Min assist, +2 safety/equipment Gait Distance (Feet): 40 Feet Assistive device: Rolling walker (2 wheels) Gait Pattern/deviations: Step-to pattern, Decreased step length - right, Trunk flexed, Decreased stride length     Pre-gait activities: static standing marches General Gait Details: slow, short steps with low foot clearance. Cues for upright posture and increased step length and height with slight improvement. Improved step length with tactile cues.   Stairs             Wheelchair Mobility     Tilt Bed    Modified Rankin (Stroke Patients Only) Modified Rankin (Stroke Patients Only) Pre-Morbid Rankin Score: No symptoms Modified Rankin: Severe disability     Balance Overall balance assessment: Needs assistance Sitting-balance support: No upper extremity supported, Feet supported Sitting balance-Leahy Scale: Good Sitting balance - Comments: sitting EOB   Standing balance support: Bilateral upper extremity supported, Reliant on assistive device for balance, During functional activity Standing balance-Leahy Scale: Poor Standing balance comment: reliant on RW support and initial  posterior bias  Communication Communication Communication: Impaired Factors Affecting Communication: Reduced clarity of speech  Cognition Arousal: Alert Behavior During Therapy: Flat affect   PT - Cognitive impairments: Sequencing, Problem solving, Safety/Judgement                       PT - Cognition Comments: improved verbalizations and interaction this session. Following commands: Impaired Following commands impaired: Follows one step commands with increased time, Follows multi-step commands inconsistently    Cueing Cueing Techniques: Verbal cues, Tactile cues, Visual cues  Exercises      General Comments        Pertinent Vitals/Pain Pain Assessment Pain Assessment: No/denies pain     PT Goals (current goals can now be found in the care plan section) Acute Rehab PT Goals Patient Stated Goal: to return to work PT Goal Formulation: With patient/family Time For Goal Achievement: 08/11/24 Progress towards PT goals: Progressing toward goals    Frequency    Min 3X/week       AM-PAC PT 6 Clicks Mobility   Outcome Measure  Help needed turning from your back to your side while in a flat bed without using bedrails?: A Little Help needed moving from lying on your back to sitting on the side of a flat bed without using bedrails?: A Little Help needed moving to and from a bed to a chair (including a wheelchair)?: A Little Help needed standing up from a chair using your arms (e.g., wheelchair or bedside chair)?: A Little Help needed to walk in hospital room?: A Lot Help needed climbing 3-5 steps with a railing? : Total 6 Click Score: 15    End of Session Equipment Utilized During Treatment: Gait belt Activity Tolerance: Patient tolerated treatment well Patient left: in chair;with call bell/phone within reach;with chair alarm set Nurse Communication: Mobility status PT Visit Diagnosis: Unsteadiness on feet  (R26.81);Other abnormalities of gait and mobility (R26.89);Muscle weakness (generalized) (M62.81);Difficulty in walking, not elsewhere classified (R26.2);Other symptoms and signs involving the nervous system (R29.898)     Time: 8649-8582 PT Time Calculation (min) (ACUTE ONLY): 27 min  Charges:    $Gait Training: 8-22 mins $Therapeutic Activity: 8-22 mins PT General Charges $$ ACUTE PT VISIT: 1 Visit                     Darryle George, PTA Acute Rehabilitation Services Secure Chat Preferred  [phone]    Darryle George 08/01/2024, 3:41 PM

## 2024-08-01 NOTE — Assessment & Plan Note (Addendum)
-   Seroquel  25 mg > retiming for 10pm; additional Seroquel  12.5 mg PRN at 8pm - Melatonin 3 mg > retiming for 10pm

## 2024-08-01 NOTE — Plan of Care (Cosign Needed)
 FMTS Brief Progress Note  S: Mr. Steenson is a 68 y.o. 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 Received paged by RN states patient with new c/o bilateral legs pain L>R. Patient c/o new onset of burning pain rate 9/10. Per patient he never experience similar pain before. 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 Extremities warm to touch. Cap refill <2 sec palpable pulse  - Continue to monitor  - Restart oxycodone  5 mg Q6H PRN for pain   Suzen Houston NOVAK, DO 08/01/2024, 9:13 PM PGY-1, [facility] Night Resident  Please page [phone] with questions.

## 2024-08-01 NOTE — Progress Notes (Addendum)
 Daily Progress Note Intern Pager: [phone]  Patient name: Wayne Molina Medical record number: [id number] Date of birth: [birth year] Age: 68 y.o. Gender: male  Primary Care Provider: Janna Ferrier, DO Consultants: Neurosurgery, neurology, oncology, palliative  Code Status: DNR-Limited  Pt Overview and Major Events to Date:  8/29-admitted, focal seizure in ED, intubated, to the ICU 9/4-extubated 9/8-neurosurgery completed open craniotomy for tumor biopsy and confirmed glioblastoma 9/10-care resumed from ICU 9/15-started on heparin  9/18-switched to Eliquis  9/23-team meeting + goals of care discussion  Medical Decision Making:  Wayne Molina is a 68 y.o. 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 Patient continues to work on functional status (strength/mobility, po intake), overall improving.  Family meeting today at 10am. Recommendations following this for removal of feeding tube, PMNR consult for readiness for CIR, and encouraged po intake. Confirmed code status. See plan of care note for further details. 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 - Neurology consulted, appreciate recommendations - Neurosurgery consulted, appreciate recommendations  - Palliative consulted, appreciate assistance in patient's care - Oncology consulted, appreciate recommendations - Repeat stat CT head if there is acute change in mental status or new neurological deficits - Keppra  1000 mg BID - Versed  prn for clinical seizures - IV Dexamethasone  2 mg Q8H (8am, 1pm, and 6pm along with other nightly medications at 6pm) > transitioning to PO with neurology's go-ahead - Taper per neurology: reduce to 2 mg q12h after 2 weeks (on 10/1), then after 2 weeks reduce to 2mg  once daily (on 10/15) - Glucose regimen: Lantus  8u at bedtime and SSI TID with meals while on steroids to maintain  glucose 140-180 - Eliquis  5 mg BID - Pain regimen: acetaminophen  1000 mg q6h; DISCONTINUE prn oxycodone  5 mg q4h - Cough: Guaifenesin  15 mL q6h prn, throat spray prn - Bowel Regimen: Lactulose  10 g TID - Nausea regimen: Zofran  4 mg PRN - Continuous cardiac monitoring - Neuro checks Q4H - PT/OT following - SLP following - Fall and seizure precautions - AM Labs: BMP, Mag, Phos, CBC - PMNR order already placed for CIR readiness Malnutrition of moderate degree Smallbore feeding tube (Cortrak) left nare. - Will discontinue feeding tube following family meeting   - RD consulted, appreciate recs - Ensure QID - Protonix  40 mg daily - Consider mirtazapine or Marinol for appetite stimulation as needed Wound of gluteal cleft Irritant contact dermatitis due to fecal incontinence Suspected fungal etiology. - Clotrimazole  cream twice daily - Gerhardt's butt cream TID  - Desitin 40% ointment  - Turn patient every 2 hours - Consider rectal tube if diarrhea continues Atrial fibrillation with RVR (HCC) Ventricular tachycardia (HCC) 5 beat run of VT last night, asymptomatic. Will continue to monitor. - Coreg  12.5 mg twice daily; consider increased dose if sustained run of Vtach - Eliquis  5 mg BID Thrombosis/embolism, venous Right gastrocnemius veins. - Anticoagulation as above Sundowning - Seroquel  25 mg > retiming for 10pm; additional Seroquel  12.5 mg PRN at 8pm - Melatonin 3 mg > retiming for 10pm Chronic health problem Urinary retention - Foley in place (failed void trail); on Flomax  0.4mg  HTN - controlled, continue home Coreg  12.5 mg BID. Holding home Losartan . Discontinued hydralazine  9/12. Systolic goal <160.  HLD - Continue home Atorvastatin  40mg  daily  FEN/GI: Dys 3 PPx: Eliquis  5 mg BID Dispo:CIR following evaluation for readiness.  Subjective:  Saw patient this morning ~8am,  after he had finished eating breakfast (ate around 1/2 plate). Reported feeling improved in terms of  abdominal pain following bowel movement post-enema yesterday. No other concerns. Wife and brother present and participated in discussion.  Objective: Temp:  [97.4 F (36.3 C)-98 F (36.7 C)] 97.4 F (36.3 C) (09/23 0802) Pulse Rate:  [46-96] 76 (09/23 0802) Resp:  [14-18] 18 (09/23 0802) BP: (110-133)/(65-96) 125/76 (09/23 0802) SpO2:  [90 %-100 %] 99 % (09/23 0802) Physical Exam: General: Patient in bed with head of bed elevated, finishing breakfast, no acute distress. Cardiovascular: Irregularly irregular. Respiratory: Normal work of breathing on room air. Clear to auscultation bilaterally; no wheezes, crackles. Unconnected feeding tube in nose Abdomen: Bowel sounds present and normoactive bilaterally. Soft, nondistended, nontender. Extremities: Skin cool, dry.  No bilateral lower extremity edema.  Laboratory: Most recent CBC Lab Results  Component Value Date   WBC 13.5 (H) 08/01/2024   HGB 16.5 08/01/2024   HCT 48.3 08/01/2024   MCV 89.6 08/01/2024   PLT 474 (H) 08/01/2024   Most recent BMP    Latest Ref Rng & Units 08/01/2024    4:38 AM  BMP  Glucose 70 - 99 mg/dL 90   BUN 8 - 23 mg/dL 40   Creatinine 9.38 - 1.24 mg/dL 9.20   Sodium 864 - 854 mmol/L 136   Potassium 3.5 - 5.1 mmol/L 4.7   Chloride 98 - 111 mmol/L 100   CO2 22 - 32 mmol/L 27   Calcium  8.9 - 10.3 mg/dL 8.8   Mag: 2.4  Phos: 4.5   Larraine Palma, MD 08/01/2024, 11:18 AM  PGY-1, [facility] FPTS Intern pager: [phone], text pages welcome Secure chat group [facility] Teaching Service

## 2024-08-01 NOTE — Assessment & Plan Note (Signed)
 Suspected fungal etiology. - Clotrimazole  cream twice daily - Gerhardt's butt cream TID  - Desitin 40% ointment  - Turn patient every 2 hours - Consider rectal tube if diarrhea continues

## 2024-08-01 NOTE — Assessment & Plan Note (Addendum)
 5 beat run of VT last night, asymptomatic. Will continue to monitor. - Coreg  12.5 mg twice daily; consider increased dose if sustained run of Vtach - Eliquis  5 mg BID

## 2024-08-01 NOTE — Progress Notes (Signed)
 Speech Language Pathology Treatment: Dysphagia  Patient Details Name: Wayne Molina MRN: [id number] DOB: Aug 24, 1956 Today's Date: 08/01/2024 Time: 8688-8650 SLP Time Calculation (min) (ACUTE ONLY): 38 min  Assessment / Plan / Recommendation Clinical Impression  Patient seen by SLP for skilled treatment focused on speech and swallow function goals. His wife was in the room during session. SLP educated and demonstrated swallow exercises: Effortful Swallow and Masako. He was able to return demonstrate and perform each exercise three times following moderate amount of cueing and modeling. SLP provided handout of instructions and recommended patient perform each exercise 10 reps, 3 times a day. SLP then instructed patient on use of EMST device. After initial trials, device was set to resistance of 10cmH20. Patient was able to perform two sets of 5 reps, requiring initially moderate amount of cues for proper amount of time between reps and sets, improving to minimal amount of cues. SLP instructed patient to perform 5 sets of 5 reps (25 total). Patient indicated that he felt the EMST device was helpful. SLP also educated patient and spouse on the flutter valve which another provider had brought into room as neither indicated they knew purpose of it. Patient continues to have the goal of inpatient rehab to work on his strength. SLP will continue to follow.   HPI HPI: Sly L. Gaudin is a 68 y.o. man who presented to [facility] 8/28 with AMS. MRI large infiltrative tumor with heterogeneous enhancement tracking from the splenium of corpus callosum near midline into right temporal lobe. Course complicated by seizure; intubated 8/29-9/5.  Underwent craniotomy and biopsy 9/8 with result of glioblastoma multiforme stage IV. Has cortrak; on dysphagia 3/thin liquids. PMHx significant for HTN, HLD, Afib on Eliquis , untreated OSA.      SLP Plan  Continue with current plan of care          Recommendations  Diet  recommendations: Dysphagia 3 (mechanical soft);Thin liquid Liquids provided via: Cup;Straw Medication Administration: Other (Comment) (as tolerated) Supervision: Full supervision/cueing for compensatory strategies Compensations: Minimize environmental distractions;Slow rate;Small sips/bites Postural Changes and/or Swallow Maneuvers: Seated upright 90 degrees;Upright 30-60 min after meal                  Oral care BID   Frequent or constant Supervision/Assistance Dysphagia, oropharyngeal phase (R13.12)     Continue with current plan of care    Norleen IVAR Blase, MA, CCC-SLP Speech Therapy

## 2024-08-01 NOTE — Progress Notes (Signed)
 Daily Progress Note   Patient Name: Wayne Molina       Date: 08/01/2024 DOB: [birth year]  Age: 68 y.o. MRN#: [id number] Attending Physician: McDiarmid, Krystal BIRCH, MD Primary Care Physician: Janna Ferrier, DO Admit Date: 07/06/2024  Reason for Consultation/Follow-up: Establishing goals of care  Subjective: Medical records reviewed including progress notes, labs and imaging. Patient assessed at the bedside. He is improving, cortrak remains in place. His wife and brother are present for scheduling family meeting and we are also joined by his niece. PMT, neurology, and family medicine teams present for goals of care discussion.  Created space and opportunity for patient and family's thoughts and feelings on patient's current illness. Patient is looking forward to more improvement and taking advantage of his strong days. Family has questions about CIR and timeline of next steps. Unfortunately, oncology team was not available to answer questions regarding treatment of his cancer - goals are clear to optimize functioning and nutrition to pursue aggressive treatment. Patient confirms he remains agreeable to outpatient palliative care visits at the cancer center in conjunction with his oncology visits.   Detailed discussed was had regarding patient's care preferences for nutrition, including option for PEG if unable to meet nutritional needs. He does not want to pursue a PEG. He confirms his wishes for DNR/DNI as well.   Questions and concerns addressed. PMT will continue to support holistically.   Length of Stay: 25   Physical Exam Vitals and nursing note reviewed.  Constitutional:      General: He is not in acute distress.    Appearance: He is ill-appearing.     Interventions: Nasal cannula in place.     Comments: Cortrak in place  HENT:      Head: Normocephalic and atraumatic.  Cardiovascular:     Rate and Rhythm: Normal rate.  Pulmonary:     Effort: Pulmonary effort is normal.  Neurological:     Mental Status: He is alert and oriented to person, place, and time.  Psychiatric:        Mood and Affect: Mood normal.            Vital Signs: BP 125/76 (BP Location: Right Arm)   Pulse 76   Temp (!) 97.4 F (36.3 C) (Oral)   Resp 18   Ht 5' 9 (1.753 m)   Wt 77 kg   SpO2 99%   BMI 25.07 kg/m  SpO2: SpO2: 99 % O2 Device: O2 Device: Room Air O2 Flow Rate: O2 Flow Rate (L/min): 5 L/min  Palliative Care Assessment & Plan   Patient Profile: 67 y.o. male  with past medical history of atrial fibrillation, depression/anxiety, hypertension, hyperlipidemia, atypical variant hypertrophic cardiomyopathy, OSA, osteoporosis admitted on 07/06/2024 with weakness of left arm and slurred speech.    I nitially brought in as code stroke in the emergency room, CTA negative for large vessel occlusion and suggestive of possible right MCA stroke. Repeat head CT was obtained in the ED due to AMS and eye twitching, suggestive of mass. He then received lorazepam  for seizure-like activity, subsequently intubated for airway protection and transferred to the intensive care unit. 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 Hospitalization further complicated by fevers, RLE DVT, HAP, afib/RVR, malnutrition requiring cortrak placement, sundowning.    PMT has been consulted to assist with goals of care conversation.  Assessment: Goals of care conversation Seizure secondary to brain mass GBM HAP Acute DVT RLE  Recommendations/Plan: Continue DNR/DNI Goals are for as much improvement as possible to pursue cancer treatment, CIR preferred if eligible and then [facility] if still requiring SNF after that NO PEG Referral to outpatient palliative care at [facility] previously placed Psychosocial and emotional support  provided PMT remains available as needed   Prognosis:  Guarded with highly aggressive BGM  Discharge Planning: CIR  Care plan was discussed with patient, patient's wife/brother/niece, primary team, neurology   Maela Takeda SHAUNNA Fell, PA-C  Palliative Medicine Team Team phone # [phone]  Thank you for allowing the Palliative Medicine Team to assist in the care of this patient. Please utilize secure chat with additional questions, if there is no response within 30 minutes please call the above phone number.  Palliative Medicine Team providers are available by phone from 7am to 7pm daily and can be reached through the team cell phone.  Should this patient require assistance outside of these hours, please call the patient's attending physician.     Time Total: 65  Visit consisted of counseling and education dealing with the complex and emotionally intense issues of symptom management and palliative care in the setting of serious and potentially life-threatening illness. Greater than 50% of this time was spent counseling and coordinating care related to the above assessment and plan.  Personally spent 65 minutes in patient care including extensive chart review (labs, imaging, progress/consult notes, vital signs), medically appropraite exam, discussed with treatment team, education to patient, family, and staff, documenting clinical information, medication review and management, coordination of care, and available advanced directive documents.

## 2024-08-01 NOTE — Assessment & Plan Note (Addendum)
 Smallbore feeding tube (Cortrak) left nare. - Will discontinue feeding tube following family meeting   - RD consulted, appreciate recs - Ensure QID - Protonix  40 mg daily - Consider mirtazapine or Marinol for appetite stimulation as needed

## 2024-08-01 NOTE — Plan of Care (Signed)
 Goals of Care/Family Meeting Discussion at 10am: Alongside palliative and neurology team (oncology unfortunately unable to attend), met with patient and family (brother, wife, niece).  - Discussed patient's preference for not having a PEG tube placed and encouraged continued increased PO intake. Given this, will plan to take out nasal feeding tube.  - Also discussed patient's appropriateness for AIR, and our plan to start this referral process. Patient reported he has days where he feels strong, and that these are increasing in frequency; he was very active prior to this hospitalization and worked out 3-4 times a week. Will reach out to [facility] for evaluation for CIR. - Encouraged patient to ask oncology team about what things he can do to increase his readiness for/ability to receive chemo treatment.  - Confirmed with patient and family desire for code status to be DNR-Limited. All current questions answered.  Alan Flies, MD 11:25 AM 08/01/24

## 2024-08-01 NOTE — Assessment & Plan Note (Signed)
 Right gastrocnemius veins. - Anticoagulation as above

## 2024-08-02 DIAGNOSIS — M79604 Pain in right leg: Secondary | ICD-10-CM | POA: Insufficient documentation

## 2024-08-02 DIAGNOSIS — C719 Malignant neoplasm of brain, unspecified: Secondary | ICD-10-CM | POA: Diagnosis not present

## 2024-08-02 LAB — GLUCOSE, CAPILLARY
Glucose-Capillary: 124 mg/dL — ABNORMAL HIGH (ref 70–99)
Glucose-Capillary: 206 mg/dL — ABNORMAL HIGH (ref 70–99)
Glucose-Capillary: 172 mg/dL — ABNORMAL HIGH (ref 70–99)
Glucose-Capillary: 148 mg/dL — ABNORMAL HIGH (ref 70–99)
Glucose-Capillary: 211 mg/dL — ABNORMAL HIGH (ref 70–99)

## 2024-08-02 MED ORDER — BISMUTH SUBSALICYLATE 262 MG/15ML PO SUSP
30.0000 mL | ORAL | Status: DC | PRN
  Administered 2024-08-03 (×3): 30 mL via ORAL
  Filled 2024-08-02: qty 236

## 2024-08-02 NOTE — Plan of Care (Signed)
  Problem: Education: Goal: Knowledge of General Education information will improve Description: Including pain rating scale, medication(s)/side effects and non-pharmacologic comfort measures Outcome: Progressing   Problem: Health Behavior/Discharge Planning: Goal: Ability to manage health-related needs will improve Outcome: Progressing   Problem: Clinical Measurements: Goal: Ability to maintain clinical measurements within normal limits will improve Outcome: Progressing Goal: Will remain free from infection Outcome: Progressing Goal: Diagnostic test results will improve Outcome: Progressing Goal: Respiratory complications will improve Outcome: Progressing   Problem: Activity: Goal: Risk for activity intolerance will decrease Outcome: Progressing   Problem: Nutrition: Goal: Adequate nutrition will be maintained Outcome: Progressing   Problem: Coping: Goal: Level of anxiety will decrease Outcome: Progressing   Problem: Elimination: Goal: Will not experience complications related to bowel motility Outcome: Progressing Goal: Will not experience complications related to urinary retention Outcome: Progressing   Problem: Pain Managment: Goal: General experience of comfort will improve and/or be controlled Outcome: Progressing   Problem: Safety: Goal: Ability to remain free from injury will improve Outcome: Progressing   Problem: Skin Integrity: Goal: Risk for impaired skin integrity will decrease Outcome: Progressing   Problem: Education: Goal: Ability to describe self-care measures that may prevent or decrease complications (Diabetes Survival Skills Education) will improve Outcome: Progressing Goal: Individualized Educational Video(s) Outcome: Progressing   Problem: Coping: Goal: Ability to adjust to condition or change in health will improve Outcome: Progressing   Problem: Fluid Volume: Goal: Ability to maintain a balanced intake and output will  improve Outcome: Progressing   Problem: Health Behavior/Discharge Planning: Goal: Ability to identify and utilize available resources and services will improve Outcome: Progressing Goal: Ability to manage health-related needs will improve Outcome: Progressing   Problem: Metabolic: Goal: Ability to maintain appropriate glucose levels will improve Outcome: Progressing   Problem: Nutritional: Goal: Maintenance of adequate nutrition will improve Outcome: Progressing Goal: Progress toward achieving an optimal weight will improve Outcome: Progressing   Problem: Skin Integrity: Goal: Risk for impaired skin integrity will decrease Outcome: Progressing   Problem: Tissue Perfusion: Goal: Adequacy of tissue perfusion will improve Outcome: Progressing   Problem: Education: Goal: Knowledge of the prescribed therapeutic regimen will improve Outcome: Progressing   Problem: Clinical Measurements: Goal: Usual level of consciousness will be regained or maintained. Outcome: Progressing Goal: Neurologic status will improve Outcome: Progressing Goal: Ability to maintain intracranial pressure will improve Outcome: Progressing   Problem: Skin Integrity: Goal: Demonstration of wound healing without infection will improve Outcome: Progressing

## 2024-08-02 NOTE — Assessment & Plan Note (Addendum)
 Episode last night, improved after oxy 5 mg.  Trying to avoid opioids in this patient.  Suspect some contribution from walking with PT for first time since getting out of ICU. - AM labs: Iron, TIBC, ferritin - Pain regimen: acetaminophen  1000 mg q6h, oxycodone  5 mg q6h PRN [trying to limit opioid use]

## 2024-08-02 NOTE — Assessment & Plan Note (Addendum)
 Currently PO only, feeding tube stopped 9/23. Currently, patient not wanting PEG tube, but open to reconsidering this if nutrition declines.  Tube still in nose. - RD consulted, appreciate recs - Ensure QID - Protonix  40 mg daily - Consider mirtazapine or Marinol for appetite stimulation as needed

## 2024-08-02 NOTE — Assessment & Plan Note (Addendum)
-   Neurology signed-off - Neurosurgery signed-off - Palliative consulted, appreciate assistance in patient's care - Oncology consulted, appreciate recommendations - Repeat stat CT head if there is acute change in mental status or new neurological deficits - Keppra  1000 mg BID - Versed  prn for clinical seizures - PO Dexamethasone  2 mg Q8H (8am, 1pm, and 6pm along with other nightly medications at 6pm) - Taper per neurology: reduce to 2 mg q12h after 2 weeks (on 10/1), then after 2 weeks reduce to 2mg  once daily (on 10/15) - Glucose regimen: Lantus  8u at bedtime and SSI TID with meals while on steroids to maintain glucose 140-180 - Eliquis  5 mg BID - Pain regimen: acetaminophen  1000 mg q6h, oxycodone  5 mg q6h PRN - Cough: Guaifenesin  15 mL q6h prn, throat spray prn - Bowel Regimen: Lactulose  10 g TID - Nausea regimen: Zofran  4 mg PRN - Continuous cardiac monitoring - Neuro checks Q4H > discontinue - PT/OT following - SLP following - Fall and seizure precautions - AM Labs: AM CBC, BMP, Mag, Phos  - PMNR to eval for CIR readiness

## 2024-08-02 NOTE — Assessment & Plan Note (Signed)
 Right gastrocnemius veins. - Anticoagulation as above

## 2024-08-02 NOTE — Assessment & Plan Note (Signed)
 Suspected fungal etiology. - Clotrimazole  cream twice daily - Gerhardt's butt cream TID  - Desitin 40% ointment  - Turn patient every 2 hours

## 2024-08-02 NOTE — Assessment & Plan Note (Signed)
 Occasional asymptomatic runs of Vtach. None last night. - Coreg  12.5 mg twice daily; consider increased dose if sustained run of Vtach - Eliquis  5 mg BID

## 2024-08-02 NOTE — Assessment & Plan Note (Signed)
-   Seroquel  25 mg at 10pm; additional Seroquel  12.5 mg PRN at 8pm - Melatonin 3 mg at 10pm

## 2024-08-02 NOTE — Progress Notes (Signed)
 Remote Loop Recorder Transmission

## 2024-08-02 NOTE — Progress Notes (Signed)
 Daily Progress Note Intern Pager: [phone]  Patient name: Wayne Molina Medical record number: [id number] Date of birth: Mar 18, 1956 Age: 68 y.o. Gender: male  Primary Care Provider: Janna Ferrier, DO Consultants: Neurosurgery (signed-off), neurology (signed-off), oncology, palliative  Code Status: DNR-Limited  Pt Overview and Major Events to Date:  8/29-admitted, focal seizure in ED, intubated, to the ICU 9/4-extubated 9/8-neurosurgery completed open craniotomy for tumor biopsy and confirmed glioblastoma 9/10-care resumed from ICU 9/15-started on heparin  9/18-switched to Eliquis  9/23-family meeting  Medical Decision Making:  Wayne Molina is a 68 y.o. male with PMH of A fib, OSA, osteoporosis, HTN, HLD admitted for seizure and found to have glioblastoma multiform.   Patient is stable for discharge to AIR from a medical perspective; awaiting eval by [facility] and bed. 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 - Neurology signed-off - Neurosurgery signed-off - Palliative consulted, appreciate assistance in patient's care - Oncology consulted, appreciate recommendations - Repeat stat CT head if there is acute change in mental status or new neurological deficits - Keppra  1000 mg BID - Versed  prn for clinical seizures - PO Dexamethasone  2 mg Q8H (8am, 1pm, and 6pm along with other nightly medications at 6pm) - Taper per neurology: reduce to 2 mg q12h after 2 weeks (on 10/1), then after 2 weeks reduce to 2mg  once daily (on 10/15) - Glucose regimen: Lantus  8u at bedtime and SSI TID with meals while on steroids to maintain glucose 140-180 - Eliquis  5 mg BID - Pain regimen: acetaminophen  1000 mg q6h, oxycodone  5 mg q6h PRN - Cough: Guaifenesin  15 mL q6h prn, throat spray prn - Bowel Regimen: Lactulose  10 g TID - Nausea regimen: Zofran  4 mg PRN - Continuous cardiac monitoring - Neuro checks Q4H > discontinue - PT/OT following - SLP  following - Fall and seizure precautions - AM Labs: AM CBC, BMP, Mag, Phos  - PMNR to eval for CIR readiness Bilateral leg pain Episode last night, improved after oxy 5 mg.  Trying to avoid opioids in this patient.  Suspect some contribution from walking with PT for first time since getting out of ICU. - AM labs: Iron, TIBC, ferritin - Pain regimen: acetaminophen  1000 mg q6h, oxycodone  5 mg q6h PRN [trying to limit opioid use] Malnutrition of moderate degree Currently PO only, feeding tube stopped 9/23. Currently, patient not wanting PEG tube, but open to reconsidering this if nutrition declines.  Tube still in nose. - RD consulted, appreciate recs - Ensure QID - Protonix  40 mg daily - Consider mirtazapine or Marinol for appetite stimulation as needed Wound of gluteal cleft Irritant contact dermatitis due to fecal incontinence Suspected fungal etiology. - Clotrimazole  cream twice daily - Gerhardt's butt cream TID  - Desitin 40% ointment  - Turn patient every 2 hours Atrial fibrillation with RVR (HCC) Ventricular tachycardia (HCC) Occasional asymptomatic runs of Vtach. None last night. - Coreg  12.5 mg twice daily; consider increased dose if sustained run of Vtach - Eliquis  5 mg BID Thrombosis/embolism, venous Right gastrocnemius veins. - Anticoagulation as above Sundowning - Seroquel  25 mg at 10pm; additional Seroquel  12.5 mg PRN at 8pm - Melatonin 3 mg at 10pm Chronic health problem Urinary retention - Foley in place (failed void trail); on Flomax  0.4mg  HTN - controlled, continue home Coreg  12.5 mg BID. Holding home Losartan . Discontinued hydralazine  9/12. Systolic goal <160.  HLD - Continue home Atorvastatin  40mg  daily  FEN/GI: Dys 3 PPx: Eliquis  5 mg BID Dispo: CIR  pending eval by PMNR and bed availability  Subjective:  Patient reports he is doing well today overall.  He was able to eat nearly his full dinner last night and is ready for breakfast this morning.  Per wife,  he did better with the nighttime timing of his medications.  He was able to walk with physical therapy yesterday.  He reports that the leg pain from last night has resolved, and has no issues with this currently.  Last bowel movement 2 days ago, denies current feeling of abdominal fullness or pain.  No other concerns.  Wife was present throughout visit and contributed to conversation.  Objective: Temp:  [97.4 F (36.3 C)-98 F (36.7 C)] 97.4 F (36.3 C) (09/24 0757) Pulse Rate:  [57-80] 63 (09/24 0757) Resp:  [16-19] 19 (09/24 0757) BP: (102-136)/(52-96) 102/75 (09/24 0757) SpO2:  [95 %-100 %] 100 % (09/24 0757) Physical Exam: General: Patient lying in bed with head of bed elevated, no acute distress. Cardiovascular: Irregularly irregular, no murmurs/rubs/gallops Respiratory: Normal work of breathing on room air, clear to auscultation bilaterally.  Feeding tube still is in left nostril Abdomen: Bowel sounds present and normoactive bilaterally. Soft, nondistended, nontender. Extremities: Skin warm, dry. No bilateral lower extremity edema.  DP pulses 2+ bilaterally.  No pain to palpation of bilateral calves.  Laboratory: Most recent CBC Lab Results  Component Value Date   WBC 13.5 (H) 08/01/2024   HGB 16.5 08/01/2024   HCT 48.3 08/01/2024   MCV 89.6 08/01/2024   PLT 474 (H) 08/01/2024   Most recent BMP    Latest Ref Rng & Units 08/01/2024    4:38 AM  BMP  Glucose 70 - 99 mg/dL 90   BUN 8 - 23 mg/dL 40   Creatinine 9.38 - 1.24 mg/dL 9.20   Sodium 864 - 854 mmol/L 136   Potassium 3.5 - 5.1 mmol/L 4.7   Chloride 98 - 111 mmol/L 100   CO2 22 - 32 mmol/L 27   Calcium  8.9 - 10.3 mg/dL 8.8     Wayne Palma, MD 08/02/2024, 10:56 AM  PGY-1, [place] [facility] FPTS Intern pager: [phone], text pages welcome Secure chat group [facility] Teaching Service

## 2024-08-02 NOTE — Assessment & Plan Note (Signed)
 Urinary retention - Foley in place (failed void trail); on Flomax  0.4mg  HTN -controlled, continue home Coreg  12.5 mg BID. Holding home Losartan . Discontinued hydralazine  9/12. Systolic goal <160.  HLD - Continue home Atorvastatin  40mg  daily

## 2024-08-02 NOTE — Progress Notes (Signed)
 Inpatient Rehab Coordinator Note:  I met with patient and his family at bedside to discuss CIR recommendations and goals/expectations of CIR stay.  We reviewed 3 hrs/day of therapy, physician follow up, and average length of stay 2 weeks (dependent upon progress) with goals to maximize functional status prior to oncology follow up as an outpatient.  We discussed that progress on CIR may be non-linear and regardless of functional status at discharge would recommend 24/7 support.  Pt's spouse and brother both confirm pt will have necessary caregiver support at discharge.  I clarified pt's goal to pursue all aggressive intervention offered at this time (he confirms) and that if his goals were to change at any time that was okay.  I reviewed need for insurance prior auth with [facility] and I will work on this.  Will ask rehab MD to see for formal consult to assist with auth process.  We will continue to follow.   Reche Lowers, PT, DPT Admissions Coordinator [phone] 08/02/24  3:23 PM

## 2024-08-02 NOTE — Progress Notes (Signed)
 Occupational Therapy Treatment Patient Details Name: Wayne Molina MRN: [id number] DOB: [birth year] Today's Date: 08/02/2024   History of present illness Pt is a 68 y/o male presenting on 8/28 with L arm weakness, L hemianopsia, and slurred speech. 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 Course complicated by seizure episode. Intubated 8/29, extubated 9/4 but still with difficulty managing secretions.  Biopsy on 9/8. +DVT R LE 9/14.  PMH includes: anxiety, HLD, HTN, sleep apnea, chronic afib.   OT comments  Pt greeted in supine, agreeable for OT visit, eager to participate. RN pre-medicated for BLE tightness ~30 mins prior to OT arrival. Pt made great progress towards OT goals today - able to progress to standing grooming ADLs at the sink. Still requires up to min A for transfers and ambulation via RW and min A for grooming task completion/termination. Cognitive cueing provided throughout. HR briefly to 150s during mobility, per RN non-sustained and relieved with rest. Pt asymptomatic. OT to continue to follow.      If plan is discharge home, recommend the following:  A lot of help with walking and/or transfers;A little help with bathing/dressing/bathroom;Assistance with cooking/housework;Direct supervision/assist for medications management;Direct supervision/assist for financial management;Assist for transportation;Help with stairs or ramp for entrance;Supervision due to cognitive status   Equipment Recommendations  Other (comment) (defer to next level of care)    Recommendations for Other Services      Precautions / Restrictions Precautions Precautions: Fall Precaution/Restrictions Comments: delirium prevention; SBP <160 Restrictions Weight Bearing Restrictions Per Provider Order: No       Mobility Bed Mobility Overal bed mobility: Needs Assistance Bed Mobility: Supine to Sit, Sit to Supine      Supine to sit: Supervision, HOB elevated, Used rails Sit to supine: Min assist, HOB elevated, Used rails   General bed mobility comments: Assist for LE mgmt during transitions    Transfers Overall transfer level: Needs assistance Equipment used: Rolling walker (2 wheels) Transfers: Sit to/from Stand Sit to Stand: Min assist           General transfer comment: Cueing for hand placement and momentum swing to initiate powering up to RW. Ambulated to sink via RW with min A, small steps, cues to maintain proximity within RW. Responds well to big steps and with visual cues for foot placement to safely approach sink.     Balance Overall balance assessment: Needs assistance Sitting-balance support: No upper extremity supported, Feet supported Sitting balance-Leahy Scale: Good Sitting balance - Comments: seated EOB without back support Postural control: Posterior lean (initially retropulsive) Standing balance support: Bilateral upper extremity supported, Reliant on assistive device for balance, During functional activity Standing balance-Leahy Scale: Poor Standing balance comment: relies on RW for stability, intermittently misplacing feet within RW when changing directions (especially to turning to the L side)                           ADL either performed or assessed with clinical judgement   ADL   Eating/Feeding: Set up;Bed level   Grooming: Wash/dry hands;Wash/dry face;Oral care;Minimal assistance;Cueing for sequencing;Standing Grooming Details (indicate cue type and reason): cued to terminate oral care task and for completed sequencing, assist for stability             Lower Body Dressing: Contact guard assist;Sitting/lateral leans Lower Body Dressing Details (indicate cue type and reason): donned B socks via  hip hike technique, inc time with LLE sock                    Extremity/Trunk Assessment Upper Extremity Assessment LUE Deficits / Details: LUE  grossly 3+/5 to MMT            Vision   Vision Assessment?: Vision impaired- to be further tested in functional context Additional Comments: con't to benefit from cues for L attention   Perception Perception Perception: Impaired Preception Impairment Details: Inattention/Neglect Perception-Other Comments: cues for scanning environment to locate items to the L side   Praxis     Communication Communication Communication: Impaired Factors Affecting Communication: Reduced clarity of speech   Cognition Arousal: Alert Behavior During Therapy: Flat affect Cognition: Cognition impaired     Awareness: Intellectual awareness impaired Memory impairment (select all impairments): Short-term memory, Working memory Attention impairment (select first level of impairment): Selective attention Executive functioning impairment (select all impairments): Organization, Sequencing, Reasoning, Problem solving OT - Cognition Comments: pt very participatory & motivated during OT session                 Following commands: Impaired Following commands impaired: Follows multi-step commands inconsistently, Follows one step commands with increased time      Cueing   Cueing Techniques: Verbal cues, Tactile cues, Visual cues  Exercises      Shoulder Instructions       General Comments      Pertinent Vitals/ Pain       Pain Assessment Pain Assessment: 0-10 Pain Score: 6  Pain Location: BLE Pain Descriptors / Indicators: Tightness Pain Intervention(s): Limited activity within patient's tolerance, Monitored during session, Premedicated before session, Repositioned  Home Living                                          Prior Functioning/Environment              Frequency  Min 2X/week        Progress Toward Goals  OT Goals(current goals can now be found in the care plan section)  Progress towards OT goals: Progressing toward goals     Plan       Co-evaluation                 AM-PAC OT 6 Clicks Daily Activity     Outcome Measure   Help from another person eating meals?: A Little Help from another person taking care of personal grooming?: A Little Help from another person toileting, which includes using toliet, bedpan, or urinal?: A Lot Help from another person bathing (including washing, rinsing, drying)?: A Lot Help from another person to put on and taking off regular upper body clothing?: A Little Help from another person to put on and taking off regular lower body clothing?: A Lot 6 Click Score: 15    End of Session Equipment Utilized During Treatment: Gait belt;Rolling walker (2 wheels)  OT Visit Diagnosis: Unsteadiness on feet (R26.81);Other abnormalities of gait and mobility (R26.89);Muscle weakness (generalized) (M62.81)   Activity Tolerance Patient tolerated treatment well   Patient Left in bed;with call bell/phone within reach;with family/visitor present   Nurse Communication Mobility status        Time: 8488-8460 OT Time Calculation (min): 28 min  Charges: OT General Charges $OT Visit: 1 Visit OT Treatments $Self Care/Home Management : 23-37 mins  Zola Runion D.,  MSOT, OTR/L Acute Rehabilitation Services [phone] Secure Chat Preferred  Katalena Malveaux 08/02/2024, 4:05 PM

## 2024-08-02 NOTE — Progress Notes (Signed)
 OT Cancellation Note  Patient Details Name: ELAND LAMANTIA MRN: [id number] DOB: 10-07-1956   Cancelled Treatment:    Reason Eval/Treat Not Completed: Pain limiting ability to participate (Pt endorsing 10/10 LE pain, RN notified and providing pain meds. Will continue efforts.)   Casin Federici D., MSOT, OTR/L Acute Rehabilitation Services [phone] Secure Chat Preferred  Margert Edsall 08/02/2024, 2:39 PM

## 2024-08-03 ENCOUNTER — Telehealth: Payer: Self-pay

## 2024-08-03 ENCOUNTER — Telehealth: Payer: Self-pay | Admitting: *Deleted

## 2024-08-03 DIAGNOSIS — C719 Malignant neoplasm of brain, unspecified: Secondary | ICD-10-CM | POA: Diagnosis not present

## 2024-08-03 LAB — GLUCOSE, CAPILLARY
Glucose-Capillary: 116 mg/dL — ABNORMAL HIGH (ref 70–99)
Glucose-Capillary: 178 mg/dL — ABNORMAL HIGH (ref 70–99)
Glucose-Capillary: 188 mg/dL — ABNORMAL HIGH (ref 70–99)
Glucose-Capillary: 166 mg/dL — ABNORMAL HIGH (ref 70–99)

## 2024-08-03 LAB — BASIC METABOLIC PANEL WITH GFR
Anion gap: 3 — ABNORMAL LOW (ref 5–15)
BUN: 27 mg/dL — ABNORMAL HIGH (ref 8–23)
CO2: 26 mmol/L (ref 22–32)
Calcium: 8.3 mg/dL — ABNORMAL LOW (ref 8.9–10.3)
Chloride: 103 mmol/L (ref 98–111)
Creatinine, Ser: 0.97 mg/dL (ref 0.61–1.24)
GFR, Estimated: >60 mL/min (ref >60)
Glucose, Bld: 130 mg/dL — ABNORMAL HIGH (ref 70–99)
Potassium: 5.1 mmol/L (ref 3.5–5.1)
Sodium: 132 mmol/L — ABNORMAL LOW (ref 135–145)

## 2024-08-03 LAB — CBC
HCT: 42.7 % (ref 39.0–52.0)
Hemoglobin: 14.3 g/dL (ref 13.0–17.0)
MCH: 30.4 pg (ref 26.0–34.0)
MCHC: 33.5 g/dL (ref 30.0–36.0)
MCV: 90.9 fL (ref 80.0–100.0)
Platelets: 357 K/uL (ref 150–400)
RBC: 4.7 MIL/uL (ref 4.22–5.81)
RDW: 12.8 % (ref 11.5–15.5)
WBC: 11.4 K/uL — ABNORMAL HIGH (ref 4.0–10.5)
nRBC: 0 % (ref 0.0–0.2)

## 2024-08-03 LAB — IRON AND TIBC
Iron: 136 ug/dL (ref 45–182)
Saturation Ratios: 52 % — ABNORMAL HIGH (ref 17.9–39.5)
TIBC: 262 ug/dL (ref 250–450)
UIBC: 126 ug/dL

## 2024-08-03 LAB — FERRITIN: Ferritin: 648 ng/mL — ABNORMAL HIGH (ref 24–336)

## 2024-08-03 LAB — MAGNESIUM: Magnesium: 2 mg/dL (ref 1.7–2.4)

## 2024-08-03 LAB — PHOSPHORUS: Phosphorus: 3.5 mg/dL (ref 2.5–4.6)

## 2024-08-03 MED ORDER — GABAPENTIN 100 MG PO CAPS
100.0000 mg | ORAL_CAPSULE | Freq: Three times a day (TID) | ORAL | Status: DC
  Administered 2024-08-03 – 2024-08-04 (×4): 100 mg via ORAL
  Filled 2024-08-03 (×4): qty 1

## 2024-08-03 MED ORDER — SENNA 8.6 MG PO TABS
1.0000 | ORAL_TABLET | Freq: Every day | ORAL | Status: DC
Start: 2024-08-03 — End: 2024-08-04
  Administered 2024-08-03 – 2024-08-04 (×2): 8.6 mg via ORAL
  Filled 2024-08-03 (×2): qty 1

## 2024-08-03 MED ORDER — ACETAMINOPHEN 500 MG PO TABS
1000.0000 mg | ORAL_TABLET | Freq: Four times a day (QID) | ORAL | Status: DC
  Administered 2024-08-03 – 2024-08-04 (×4): 1000 mg via ORAL
  Filled 2024-08-03 (×5): qty 2

## 2024-08-03 NOTE — Assessment & Plan Note (Signed)
-   Seroquel  25 mg at 10pm; additional Seroquel  12.5 mg PRN at 8pm - Melatonin 3 mg at 10pm

## 2024-08-03 NOTE — Telephone Encounter (Signed)
 Received outpatient palliative care referral at [facility], will wait to schedule once d/c plans are in place.

## 2024-08-03 NOTE — Discharge Summary (Shared)
 Family Medicine Teaching [facility] Discharge Summary  Patient name: Wayne Molina Medical record number: [id number] Date of birth: 01/13/1956 Age: 68 y.o. 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   Concern for Stroke [facility])   Acute encephalopathy   Malnutrition of moderate degree   Sundowning   Wound of gluteal cleft / Irritant contact dermatitis due to fecal incontinence   Thrombosis/embolism, venous   Urinary retention   Ventricular tachycardia (HCC)   Bilateral leg pain   Brief Hospital Course:  Wayne Molina is a 68 y.o.male with a history of atrial fibrillation, HTN who was admitted to the [facility] Medicine Teaching Service at [facility] for left sided weakness and slurred speech found to be 2/2 glioblastoma. 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 Initial CT Head and CT Angio Head/Neck concerning for evolving MCA stroke. Neurology consulted. Initiated stroke work-up.   While in ED patient had a change in mental status with signs of focal seizure (nystagmus, periorbital twitching, extremity stiffening). Patient loaded with Keppra  and received Ativan . EEG was positive for epileptogenicity arising from right hemisphere (likely 2/2 mass). Keppra  dosing titrated to 1250 mg BID. Later weaned off Versed  drip without recurrence of seizure activity.  Repeat CT Head  post-initial seizure showed concern for new brain mass rather than stroke. MRI Brain showed large and infiltrative tumor tracking from splenium of corpus callosum into right temporal lobe. Due to worsening mental status and worsening respiratory depression after Ativan , he was intubated on 8/29 and sent to ICU. Neurosurgery held off on biopsy of the lesion until after extubation on 9/4. 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 CT head 9/17 with reduction in post-biopsy bleeding. Oncology, palliative consulted.  Started on IV Dexamethasone  6mg  q6h on 9/8 for cerebral edema from malignancy; transitioned to 4mg  q6h on 9/9, then to 4mg  q8h on 9/10, then to 2mg  q8h on 9/17. Switched to po 9/23.  Taper per neurology: reduce to 2 mg q12h after 2 weeks (on 10/1), then after 2 weeks reduce to 2mg  once daily (on 10/15). Was kept on insulin  while on steroids to help control blood glucose; regimen at discharge of Lantus  8u at bedtime and SSI TID with meals.  Family and goals of care meeting held on 9/23 with decision made to move forward with CIR referral, discontinue Cortrak feeding tube with continued encouraged PO intake; code status also re-confirmed at this time. Neurology signed off followed. PMNR was consulted for evaluation re: CIR, and patient was approved for this on 9/26.  Malnutrition of moderate degree Had poor po intake during admission. RD consulted and helped adjust nutrition regimen. Received feeds via smallbore Cortrak feeding tube, placed 8/31; po intake later improved and Cortrak removed on 9/24. Continued on Ensure supplementation. Patient decided against gastric tube. PO intake improved throughout hospitalization.  Bilateral leg pain Had a few episodes of severe burning leg pain since starting more intense therapy with PT/OT on 9/23. Treated with  oxy with improvement, though trying to avoid regular opioid use in this patient. Ferritin elevated, iron and  TIBC wnl, unlikely RLS. Started on gabapentin  100 mg TID for possible neuropathic component.  Venous Thrombosis US  on 9/14 suggestive of acute intramuscular thrombosis involving the right gastrocnemius vein. Following discussion with family, patient was started on anticoagulation with IV heparin  in 9/15. Following CT head on 9/17 with decreased post-biopsy residual bleed, and with okay from neurology and discussion with patient and family, he was switched to po Eliquis  on 9/18. No issues with bleeding following.  Wound of gluteal cleft In the setting of immobility. Seen on 9/14 and with satellite lesions suggestive of fungal etiology started on clotrimazole  cream BID. Also receiving wound care and Gerhardt's butt cream. Improved throughout hospitalization.  Atrial fibrillation on Eliquis  Ventricular tachycardia Overall rate controlled during admission. Heparin  was held the night before biopsy and was held postprocedure. He was bridged with aspirin , later discontinued. Anticoagulation course then as above. He did have a few intermittent overnight asymptomatic runs of Vtach, nonsustained and so no intervention indicated.  Acute hypoxic respiratory failure due to pneumonia, resolved Due to MSSA and haemophilus influenza pneumonia on sputum culture while admitted for above concerns. Experienced hypoxia requiring intubation on 8/29; extubated on 9/4. Received Unasyn  9/1-9/3. He was placed on Ancef  (9/4-9/9), later switched to Zosyn  (9/12-9/16). No further issues with respiration.  Sundowning Initially with increased agitation at night. Started on Seroquel  25 mg with additional 12.5 mg PRN; overall agitation improved.   Other chronic conditions were medically managed with home medications and formulary alternatives as necessary (urinary retention, hypertension, hyperlipidemia)  PCP Follow-up Recommendations: Neurology follow up in 4-6 weeks; will need to determine length of daily steroid. Oncology +  palliative follow up outpatient Ensure improvement of gluteal wound Continue DOAC for at least 3 months, likely provoked with malignancy and immobility Ensure taper of steroids as above  Results/Tests Pending at Time of Discharge: None  Disposition: CIR  Discharge Condition: Stable  Discharge Exam:  Vitals:   08/04/24 0825 08/04/24 1148  BP: 113/62 119/79  Pulse: 76 (!) 114  Resp: 16 16  Temp: (!) 97.5 F (36.4 C) (!) 97.4 F (36.3 C)  SpO2: 100% 99%   Physical Exam: General: Patient in bed with head of bed elevated, starting to eat breakfast, no acute distress. Cardiovascular: Irregularly irregular, no murmurs/rubs/gallops. Respiratory: Normal work of breathing on room air.  Lungs clear to auscultation bilaterally, anteriorly. Abdomen: Bowel sounds present.  Abdomen soft, nondistended, nontender to palpation. Extremities: Legs warm, dry.  No bilateral extremity edema.  DP pulses 2+ bilaterally.  Significant Procedures:  - 8/29: Intubated (extubated 9/4) - 8/31: Cortrak feeding tube placed (removed 9/24) - 9/8: Right craniotomy and biopsy of brain tumor; confirmed to be glioblastoma - 9/19: Modified Barium Swallow Study  Significant Labs and Imaging:  Recent Labs  Lab 08/03/24 0449  WBC 11.4*  HGB 14.3  HCT 42.7  PLT 357   Recent Labs  Lab 08/03/24 0449  NA 132*  K 5.1  CL 103  CO2 26  GLUCOSE 130*  BUN 27*  CREATININE 0.97  CALCIUM  8.3*  MG 2.0  PHOS 3.5   Iron/TIBC/Ferritin/ %Sat    Component Value Date/Time   IRON 136 08/03/2024 0449   TIBC 262 08/03/2024 0449   FERRITIN 648 (H) 08/03/2024 0449   IRONPCTSAT 52 (H) 08/03/2024 0449    IDH 1/2 Mutation: [From biopsy] Not detected  A1c 8/29: 6.0%  Pertinent Imaging: 8/29: CT  head (initial) Evolving acute to early subacute right MCA distribution infarct involving the posterior right frontotemporal region. No associated hemorrhage or significant mass effect.   8/29: CT Head (repeat) Now evident  is an enhancing intra-axial mass involving the posterior right frontotemporal region, in the region of previously suspected right MCA infarct. The enhancing mass is bilobed, with dominant components measuring 2.9 cm and 3.7 cm at the right temporal lobe and splenium respectively. Surrounding vasogenic edema without midline shift. Finding is concerning for a primary CNS neoplasm (GBM suspected)   8/29: CTA head/neck 1. Negative CTA for acute large vessel occlusion or other emergent finding. 2. 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 Distal left V4 segment subsequently reoccludes beyond the takeoff of the left PICA. 3. Estimated 50% stenosis or left carotid bulb  8/29: MRI Brain 1. Relatively large and infiltrative tumor with heterogeneous enhancement tracking from the splenium of the corpus callosum near midline into the right temporal lobe. Confluent regional tumoral edema. No midline shift or significant intracranial mass effect.   2. Underlying small chronic cerebellar infarcts.  8/29: Echo LVEF 60-65%. LV with no regional wall motion abnormalities; moderate LVH. Moderately reduce RV systolic function. Moderately dilated LA. Severely dilated RA. Trivial MR. No aortic stenosis, mild AR. RA pressure 15 mmHg. Cannot exclude small PFO.  9/1: CXR 1. Right PICC with tip overlying mid mediastinum just inferior to the carina (unchanged on repeat, in region of mid SVC).  2. Persistent left lower lung zone opacity. Possible left pleural effusion.  9/4: CXR Cardiomegaly with findings of CHF, significantly progressed since prior radiograph. Pneumonia is not excluded.  9/9: CT Head Postoperative changes from interval right temporal craniotomy for biopsy. Small volume postoperative hemorrhage along the biopsy tract and within the resection cavity. No other complicating features. The underlying right cerebral mass is  otherwise grossly stable.  9/11: CT Head Postoperative changes from prior right temporal craniotomy for tumor biopsy. Previously identified infiltrating mass within the right cerebral hemisphere is grossly stable. Focus of hemorrhage within the mass itself has slightly contracted in the interim. Trace residual hemorrhage along the biopsy tract has decreased as well. Similar surrounding vasogenic edema and regional mass effect without significant midline shift.  9/12: CXR Stable cardiomegaly. Lungs clear. Trachea normal. Enteric feeding tube to abdomen.  9/14: US  RUQ Abdomen Nodular liver contour raises suspicion for cirrhosis. Borderline gallbladder wall thickening may be due to nondistention. No sonographic Murphy sign.  9/15: Vascular US  bilateral Acute intramuscular thrombus involving right gastrocnemius veins  9/17: CT Head Similar appearance of right parietal craniotomy for biopsy of mass lesion. Decreased post-biopsy blood products. Similar vasogenic edema and mass effect.   Discharge Medications:  Allergies as of 08/04/2024   No Known Allergies      Medication List     STOP taking these medications    cyclobenzaprine  5 MG tablet Commonly known as: FLEXERIL    lidocaine  5 % Commonly known as: Lidoderm    losartan  100 MG tablet Commonly known as: COZAAR    Potassium Gluconate 2 MEQ Tabs   sildenafil  50 MG tablet Commonly known as: VIAGRA        TAKE these medications    acetaminophen  500 MG tablet Commonly known as: TYLENOL  Take 2 tablets (1,000 mg total) by mouth 4 (four) times daily.   albuterol  (2.5 MG/3ML) 0.083% nebulizer solution Commonly known as: PROVENTIL  Take 3 mLs (2.5 mg total) by nebulization every 6 (six) hours as  needed for wheezing or shortness of breath.   atorvastatin  40 MG tablet Commonly known as: LIPITOR Take 1 tablet (40 mg total) by mouth daily.   bismuth  subsalicylate 262 MG/15ML suspension Commonly known as: PEPTO BISMOL Take 30  mLs by mouth every 4 (four) hours as needed for diarrhea or loose stools or indigestion.   calcium  carbonate 1500 (600 Ca) MG Tabs tablet Commonly known as: OSCAL Take by mouth 2 (two) times daily with a meal.   carvedilol  12.5 MG tablet Commonly known as: COREG  Take 1 tablet (12.5 mg total) by mouth 2 (two) times daily.   clotrimazole  1 % cream Commonly known as: LOTRIMIN  Apply topically 2 (two) times daily.   dexamethasone  2 MG tablet Commonly known as: DECADRON  Take 1 tablet (2 mg total) by mouth 3 (three) times daily for 6 days, THEN 1 tablet (2 mg total) 2 (two) times daily for 14 days, THEN 1 tablet (2 mg total) daily. Start taking on: August 04, 2024   Eliquis  5 MG Tabs tablet Generic drug: apixaban  Take 1 tablet (5 mg total) by mouth 2 (two) times daily.   feeding supplement Liqd Take 237 mLs by mouth 4 (four) times daily - after meals and at bedtime.   gabapentin  100 MG capsule Commonly known as: NEURONTIN  Take 1 capsule (100 mg total) by mouth 3 (three) times daily.   Gerhardt's butt cream Crea Apply 1 Application topically 3 (three) times daily.   guaiFENesin  100 MG/5ML liquid Commonly known as: ROBITUSSIN Take 15 mLs by mouth every 6 (six) hours as needed for cough or to loosen phlegm.   insulin  aspart 100 UNIT/ML injection Commonly known as: novoLOG  Inject 0-9 Units into the skin 3 (three) times daily with meals.   insulin  glargine 100 UNIT/ML injection Commonly known as: LANTUS  Inject 0.08 mLs (8 Units total) into the skin at bedtime.   lactulose  10 GM/15ML solution Commonly known as: CHRONULAC  Take 15 mLs (10 g total) by mouth 3 (three) times daily.   levETIRAcetam  1000 MG tablet Commonly known as: KEPPRA  Take 1 tablet (1,000 mg total) by mouth 2 (two) times daily.   liver oil-zinc  oxide 40 % ointment Commonly known as: DESITIN Apply topically 2 (two) times daily as needed for irritation.   Magnesium Oxide 250 MG Tabs Take 1 tablet by mouth  daily in the afternoon.   melatonin 3 MG Tabs tablet Take 1 tablet (3 mg total) by mouth at bedtime.   mouth rinse Liqd solution 15 mLs by Mouth Rinse route 4 (four) times daily.   MULTIVITAMIN ADULT PO Take 1 tablet by mouth daily.   ondansetron  4 MG tablet Commonly known as: ZOFRAN  Take 1 tablet (4 mg total) by mouth every 4 (four) hours as needed for nausea or vomiting.   pantoprazole  40 MG tablet Commonly known as: PROTONIX  Take 1 tablet (40 mg total) by mouth daily.   phenol 1.4 % Liqd Commonly known as: CHLORASEPTIC Use as directed 1 spray in the mouth or throat as needed for throat irritation / pain.   QUEtiapine  25 MG tablet Commonly known as: SEROQUEL  Take 1 tablet (25 mg total) by mouth daily.   senna 8.6 MG Tabs tablet Commonly known as: SENOKOT Take 1 tablet (8.6 mg total) by mouth daily.   tamsulosin  0.4 MG Caps capsule Commonly known as: FLOMAX  Take 1 capsule (0.4 mg total) by mouth daily.   VITAMIN B-12 PO Take 1 capsule by mouth daily.        Discharge Instructions:  Please refer to Patient Instructions section of EMR for full details.  Patient was counseled important signs and symptoms that should prompt return to medical care, changes in medications, dietary instructions, activity restrictions, and follow up appointments.   Follow-Up Appointments: To be made after CIR stay.  Larraine Palma, MD 08/04/2024, 12:42 PM PGY-1, [facility]  I agree with the assessment and plan as documented above.  Stuart Redo, MD PGY-3, [facility]

## 2024-08-03 NOTE — Consult Note (Signed)
 Physical Medicine and Rehabilitation Consult Reason for Consult: Evaluate appropriateness for Inpatient Rehab Referring Physician: Dr. Orie    HPI: Wayne Molina is a 68 y.o. male with PMHx of  has a past medical history of Anxiety, Arthritis, Chronic atrial fibrillation (HCC), Depression, Family history of breast cancer, Family history of cancer of male genital organ, Family history of gene mutation, Family history of malignant neoplasm of gastrointestinal tract, Hyperlipidemia, Hypertension, Personal history of colonic polyps (11/12/2006), and Sleep apnea. . They were admitted to [facility] on 07/06/2024 for acute left-sided weakness and slurred speech while working in the [employer], brought up to the ER by 2 other employees.  Initial CT concerning for right MCA CVA, however patient had witnessed seizure in the ER and repeat CT was concerning for mass.  Patient was loaded with Keppra  and admitted, subsequent MRI showed infiltrating tumor concerning for glioblastoma versus lymphoma.  He was intubated from 8-29 to 9-5.  Had open craniotomy and biopsy with Dr. Rosslyn 9-8, pathology showed glioblastoma multiforme.  Per oncology, no treatments to start until 2 to 3 weeks after biopsy to allow for recovery; good prognosis for treatment given it is in the nondominant hemisphere and small in size.  He remains on Keppra  and steroids, and as shown functional improvement.  Hospital course was otherwise complicated by seizures, leukocytosis, thrombocytosis, poor p.o. intakes, bilateral leg pain, gluteal cleft wound, fecal incontinence/constipation, atrial fibrillation with RVR, right gastrocnemius DVT, cognitive deficits/sundowning, hypertension, and urinary retention failing DC Foley trial.  Per oncology, size of tumor does not explain severity of current functional deficits.  PM&R was consulted to evaluate appropriateness for IPR admission.   Prior to admission, patient  was independent without DME, working materials at [employer], and living with his wife in a 1 level house with a level entry.  He has 24/7 assistance available at discharge with family members.  Currently, he is min assist for transfers and ambulation with a rolling walker and min assist for grooming tasks, contact-guard for lower body dressing, and supervision for feeding.  He remains on a core track for supplementation and a dysphagia 3/thin liquid diet for nutrition, refuses PEG tube for nutritional supplementation.  Cognition remains altered but improving in terms of tangential responses and recall of staff.  Review of Systems  Constitutional:  Negative for chills and fever.  Eyes:  Negative for blurred vision and double vision.  Respiratory:  Negative for cough and shortness of breath.   Cardiovascular:  Negative for chest pain and palpitations.  Gastrointestinal:  Positive for abdominal pain and constipation. Negative for nausea and vomiting.  Genitourinary:        Retention  Musculoskeletal:  Positive for myalgias. Negative for neck pain.  Neurological:  Positive for speech change and focal weakness. Negative for dizziness, tingling, sensory change and headaches.  Psychiatric/Behavioral:         Confusion, worse at nighttime   Past Medical History:  Diagnosis Date   Anxiety    Arthritis    fingers   Chronic atrial fibrillation (HCC)    Depression    Family history of breast cancer    Family history of cancer of male genital organ    Family history of gene mutation    BRIP1   Family history of malignant neoplasm of gastrointestinal tract    Hyperlipidemia    Hypertension    Personal history of colonic polyps 11/12/2006   Sleep apnea  Past Surgical History:  Procedure Laterality Date   2-D echocardiogram  07/22/2010   Ejection fraction greater than 55%. Left atrium moderately dilated. Right atrium moderately dilated. 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 Mild to Moderate MR. Mild to  moderate TR.   APPLICATION OF CRANIAL NAVIGATION Right 07/17/2024   Procedure: COMPUTER-ASSISTED NAVIGATION, FOR CRANIAL PROCEDURE;  Surgeon: Rosslyn Dino HERO, MD;  Location: [facility] OR;  Service: Neurosurgery;  Laterality: Right;  CRANIAL NAVIGATION   ATRIAL ABLATION SURGERY  2004   EP IMPLANTABLE DEVICE N/A 02/11/2016   Procedure: Loop Recorder Insertion;  Surgeon: Jerel Balding, MD;  Location: [facility] INVASIVE CV LAB;  Service: Cardiovascular;  Laterality: N/A;   Exercise Myoview stress test  07/08/2000   Nonischemic low-risk.   HIP FRACTURE SURGERY  1970s   IR GENERIC HISTORICAL  01/21/2017   IR RADIOLOGIST EVAL & MGMT 01/21/2017 [facility]-INTERV RAD   STERIOTACTIC STIMULATOR INSERTION Right 07/17/2024   Procedure: OPEN CRANIOTOMY FOR BIOPSY;  Surgeon: Rosslyn Dino HERO, MD;  Location: [facility] OR;  Service: Neurosurgery;  Laterality: Right;  RIGHT STEREOTACTIC BRAIN BIOPSY   Family History  Problem Relation Age of Onset   Dementia Mother    Heart disease Father    Diabetes Father    Other Sister        BRIP1 gene mutation   Kidney disease Brother    Colon cancer Maternal Uncle    Lung cancer Paternal Aunt        d. >50   Cancer Paternal Aunt        unknown type, d. >50   Cancer Cousin 17       gynecologic (paternal first cousin)   Colon cancer Nephew 27       arising in colon polyp   Cancer Niece 3       gynecologic; niece in her 30's   Breast cancer Niece 59   Other Niece        BRIP1 gene mutation   Cancer Paternal Great-grandmother        abdominal cancer (PGF's mother) great grandmother   Social History:  reports that he has quit smoking. His smoking use included cigarettes. He has never used smokeless tobacco. He reports that he does not drink alcohol and does not use drugs. Allergies: No Known Allergies Medications Prior to Admission  Medication Sig Dispense Refill   apixaban  (ELIQUIS ) 5 MG TABS tablet Take 1 tablet (5 mg total) by mouth 2 (two) times daily. 180 tablet 1   atorvastatin   (LIPITOR) 40 MG tablet Take 1 tablet (40 mg total) by mouth daily. 90 tablet 1   calcium  carbonate (OSCAL) 1500 (600 Ca) MG TABS tablet Take by mouth 2 (two) times daily with a meal.     Cyanocobalamin (VITAMIN B-12 PO) Take 1 capsule by mouth daily.     losartan  (COZAAR ) 100 MG tablet Take 1 tablet (100 mg total) by mouth daily. 90 tablet 3   Magnesium Oxide 250 MG TABS Take 1 tablet by mouth daily in the afternoon.     Multiple Vitamin (MULTIVITAMIN ADULT PO) Take 1 tablet by mouth daily.     Potassium Gluconate 2 MEQ TABS Take 1 tablet by mouth daily in the afternoon.     sildenafil  (VIAGRA ) 50 MG tablet Take 2 tablets (100 mg total) by mouth daily as needed. 10 tablet 2   cyclobenzaprine  (FLEXERIL ) 5 MG tablet Take 1 tablet (5 mg total) by mouth 2 (two) times daily as needed for muscle spasms. (Patient not taking: Reported  on 07/07/2024) 10 tablet 0   lidocaine  (LIDODERM ) 5 % Place 1 patch onto the skin daily. Apply to affected area on neck/shoulder and keep in place for 12 hours and remove for 12 hours (Patient not taking: Reported on 07/07/2024) 10 patch 0    Home: Home Living Family/patient expects to be discharged to:: Private residence Living Arrangements: Spouse/significant other Available Help at Discharge: Family, Available 24 hours/day Type of Home: House Home Access: Level entry Home Layout: One level Bathroom Shower/Tub: Engineer, manufacturing systems: Standard Home Equipment: None  Functional History: Prior Function Prior Level of Function : Independent/Modified Independent, History of Falls (last six months) Mobility Comments: No AD, x1 fall when stepped off deck a few weeks ago, but no other falls ADLs Comments: Works night shift in Proofreader at [employer] Functional Status:  Mobility: Bed Mobility Overal bed mobility: Needs Assistance Bed Mobility: Supine to Sit, Sit to Supine Rolling: Min assist Sidelying to sit: HOB elevated, Used rails, Min assist Supine to sit:  Supervision, HOB elevated, Used rails Sit to supine: Min assist, HOB elevated, Used rails Sit to sidelying: Mod assist, +2 for physical assistance, +2 for safety/equipment General bed mobility comments: Assist for LE mgmt during transitions Transfers Overall transfer level: Needs assistance Equipment used: Rolling walker (2 wheels) Transfers: Sit to/from Stand Sit to Stand: Min assist Bed to/from chair/wheelchair/BSC transfer type:: Step pivot Squat pivot transfers: Total assist, +2 safety/equipment Step pivot transfers: Min assist Transfer via Lift Equipment: Stedy General transfer comment: Cueing for hand placement and momentum swing to initiate powering up to RW. Ambulated to sink via RW with min A, small steps, cues to maintain proximity within RW. Responds well to big steps and with visual cues for foot placement to safely approach sink. Ambulation/Gait Ambulation/Gait assistance: Min assist, +2 safety/equipment Gait Distance (Feet): 40 Feet Assistive device: Rolling walker (2 wheels) Gait Pattern/deviations: Step-to pattern, Decreased step length - right, Trunk flexed, Decreased stride length General Gait Details: slow, short steps with low foot clearance. Cues for upright posture and increased step length and height with slight improvement. Improved step length with tactile cues. Pre-gait activities: static standing marches    ADL: ADL Overall ADL's : Needs assistance/impaired Eating/Feeding: Set up, Bed level Eating/Feeding Details (indicate cue type and reason): sipping from straw in cup without spillage; provided meal and utensil setup on lunch tray Grooming: Wash/dry hands, Wash/dry face, Oral care, Minimal assistance, Cueing for sequencing, Standing Grooming Details (indicate cue type and reason): cued to terminate oral care task and for completed sequencing, assist for stability Upper Body Bathing: Moderate assistance, Sitting Lower Body Bathing: Maximal  assistance Upper Body Dressing : Minimal assistance, Cueing for sequencing, Bed level (HOB elevated) Upper Body Dressing Details (indicate cue type and reason): donned clean gown, cues for sequencing and assist to thoroughly thread LUE Lower Body Dressing: Contact guard assist, Sitting/lateral leans Lower Body Dressing Details (indicate cue type and reason): donned B socks via hip hike technique, inc time with LLE sock Toilet Transfer: Moderate assistance, +2 for physical assistance Toilet Transfer Details (indicate cue type and reason): stand step using RW to recliner Toileting- Clothing Manipulation and Hygiene: Total assistance Functional mobility during ADLs: Moderate assistance, +2 for physical assistance  Cognition: Cognition Overall Cognitive Status: Impaired/Different from baseline Arousal/Alertness: Lethargic Orientation Level: Oriented X4 Year: 2025 Month: September Day of Week: Incorrect Attention: Sustained Sustained Attention: Impaired Sustained Attention Impairment: Verbal basic, Functional basic Memory: Impaired Memory Impairment: Decreased recall of new information Awareness: Impaired Awareness Impairment:  Intellectual impairment Problem Solving: Impaired Problem Solving Impairment: Verbal basic, Functional basic Executive Function: Initiating Initiating: Impaired Initiating Impairment: Verbal basic, Functional basic Safety/Judgment: Impaired Cognition Arousal: Alert Behavior During Therapy: Flat affect Overall Cognitive Status: Impaired/Different from baseline  Blood pressure 125/89, pulse 77, temperature 98.6 F (37 C), temperature source Oral, resp. rate 16, height 5' 9 (1.753 m), weight 77 kg, SpO2 96%. Physical Exam Constitutional: No apparent distress. Appropriate appearance for age.  HENT: No JVD. Neck Supple. Trachea midline. Atraumatic, normocephalic. Eyes: PERRLA. EOMI. Visual fields grossly intact.  Cardiovascular: RRR, no murmurs/rub/gallops. No  Edema. Peripheral pulses 2+  Respiratory: CTAB. No rales, rhonchi, or wheezing. On RA.  Abdomen: + bowel sounds, normoactive. Mild distention and moderate tenderness.  GU: Not examined. +Foley, draining clear urine.  Skin: C/D/I. No apparent lesions. Red discoloration of bilateral toes without wounds, brisk capillary refill. R craniotomy with staples intact, sutures at distal ends, well approximated without drainage.  MSK:      No apparent deformity. Plantarflexion positioning of feet, can range to neutral.       Neurologic exam:  Cognition: AAO to person, place, time and event.  Language: Fluent, No substitutions or neoglisms. + Moderate dysarthria and hypophonia. Names 3/3 objects correctly.  Memory: Recalls 1/3 objects at 5 minutes without cues, 3/3 with cues. + Attention deficits Insight: Fair insight into current condition.  Mood: Pleasant affect, appropriate mood.  Sensation: To light touch intact in BL UEs and LEs  Reflexes: 2+ in BL UE and LEs. Negative Hoffman's and babinski signs bilaterally.  CN: 2-12 grossly intact.  Coordination: No apparent tremors. No ataxia on FTN, HTS bilaterally.  Spasticity: MAS 0 in all extremities.       Strength:                RUE: 4/5 SA, 5-/5 EF, 5-/5 EE, 4/5 WE, 4/5 FF, 4/5 FA                LUE:  4/5 SA, 4/5 EF, 4/5 EE, 4-/5 WE, 4-/5 FF, 4-/5 FA                RLE: 4/5 HF, 5/5 KE, 5/5  DF, 5/5  EHL, 5/5  PF                 LLE:  4/5 HF, 5/5 KE, 5/5  DF, 5/5  EHL, 5/5  PF     Results for orders placed or performed during the hospital encounter of 07/06/24 (from the past 24 hours)  Glucose, capillary     Status: Abnormal   Collection Time: 08/02/24 11:59 AM  Result Value Ref Range   Glucose-Capillary 206 (H) 70 - 99 mg/dL   Comment 1 Notify RN    Comment 2 Document in Chart   Glucose, capillary     Status: Abnormal   Collection Time: 08/02/24  3:47 PM  Result Value Ref Range   Glucose-Capillary 172 (H) 70 - 99 mg/dL   Comment 1 Notify RN     Comment 2 Document in Chart   Glucose, capillary     Status: Abnormal   Collection Time: 08/02/24  5:15 PM  Result Value Ref Range   Glucose-Capillary 148 (H) 70 - 99 mg/dL  Glucose, capillary     Status: Abnormal   Collection Time: 08/02/24  9:22 PM  Result Value Ref Range   Glucose-Capillary 211 (H) 70 - 99 mg/dL  Iron and TIBC     Status: Abnormal  Collection Time: 08/03/24  4:49 AM  Result Value Ref Range   Iron 136 45 - 182 ug/dL   TIBC 737 749 - 549 ug/dL   Saturation Ratios 52 (H) 17.9 - 39.5 %   UIBC 126 ug/dL  Ferritin     Status: Abnormal   Collection Time: 08/03/24  4:49 AM  Result Value Ref Range   Ferritin 648 (H) 24 - 336 ng/mL  CBC     Status: Abnormal   Collection Time: 08/03/24  4:49 AM  Result Value Ref Range   WBC 11.4 (H) 4.0 - 10.5 K/uL   RBC 4.70 4.22 - 5.81 MIL/uL   Hemoglobin 14.3 13.0 - 17.0 g/dL   HCT 57.2 60.9 - 47.9 %   MCV 90.9 80.0 - 100.0 fL   MCH 30.4 26.0 - 34.0 pg   MCHC 33.5 30.0 - 36.0 g/dL   RDW 87.1 88.4 - 84.4 %   Platelets 357 150 - 400 K/uL   nRBC 0.0 0.0 - 0.2 %  Magnesium     Status: None   Collection Time: 08/03/24  4:49 AM  Result Value Ref Range   Magnesium 2.0 1.7 - 2.4 mg/dL  Phosphorus     Status: None   Collection Time: 08/03/24  4:49 AM  Result Value Ref Range   Phosphorus 3.5 2.5 - 4.6 mg/dL  Basic metabolic panel     Status: Abnormal   Collection Time: 08/03/24  4:49 AM  Result Value Ref Range   Sodium 132 (L) 135 - 145 mmol/L   Potassium 5.1 3.5 - 5.1 mmol/L   Chloride 103 98 - 111 mmol/L   CO2 26 22 - 32 mmol/L   Glucose, Bld 130 (H) 70 - 99 mg/dL   BUN 27 (H) 8 - 23 mg/dL   Creatinine, Ser 9.02 0.61 - 1.24 mg/dL   Calcium  8.3 (L) 8.9 - 10.3 mg/dL   GFR, Estimated >39 >39 mL/min   Anion gap 3 (L) 5 - 15  Glucose, capillary     Status: Abnormal   Collection Time: 08/03/24  6:16 AM  Result Value Ref Range   Glucose-Capillary 116 (H) 70 - 99 mg/dL   No results found.  Assessment/Plan: Diagnosis: GBM  s/p R craniotomy and biopsy Does the need for close, 24 hr/day medical supervision in concert with the patient's rehab needs make it unreasonable for this patient to be served in a less intensive setting? Yes Co-Morbidities requiring supervision/potential complications: L hemiparesis,  seizures, leukocytosis, thrombocytosis, poor p.o. intakes, bilateral leg pain, gluteal cleft wound, fecal incontinence/constipation, atrial fibrillation with RVR, right gastrocnemius DVT, cognitive deficits/sundowning, hypertension, and urinary retention failing DC Foley trial. Due to bladder management, bowel management, safety, skin/wound care, disease management, medication administration, and patient education, does the patient require 24 hr/day rehab nursing? Yes Does the patient require coordinated care of a physician, rehab nurse, therapy disciplines of PT, OT, and SLP to address physical and functional deficits in the context of the above medical diagnosis(es)? Yes Addressing deficits in the following areas: balance, endurance, locomotion, strength, transferring, bowel/bladder control, bathing, dressing, feeding, grooming, toileting, cognition, speech, language, swallowing, and psychosocial support Can the patient actively participate in an intensive therapy program of at least 3 hrs of therapy per day at least 5 days per week? Yes The potential for patient to make measurable gains while on inpatient rehab is good Anticipated functional outcomes upon discharge from inpatient rehab are min assist  with PT, min assist with OT, supervision with SLP.  Estimated rehab length of stay to reach the above functional goals is: 5-7 days Anticipated discharge destination: Home Overall Rehab/Functional Prognosis: good  POST ACUTE RECOMMENDATIONS: This patient's condition is appropriate for continued rehabilitative care in the following setting: CIR Patient has agreed to participate in recommended program. Yes Note that  insurance prior authorization may be required for reimbursement for recommended care.  Comment: Per Oncology, patient will need to optimize functional recovery over a short period of ideally one week or less before initiation of treatment for aggressive intracranial cancer, as further timelines would result in worsening of disease an function prior to treatment. He is tolerating therapies well and is an ideal candidate for a short, intensive inpatient rehab program. He has 24/7 physical assistance available from his wife and excellent family support for discharge.     I have personally performed a face to face diagnostic evaluation of this patient. Additionally, I have examined the patient's medical record including any pertinent labs and radiographic images. If the physician assistant has documented in this note, I have reviewed and edited or otherwise concur with the physician assistant's documentation.  Thanks,  Joesph JAYSON Likes, DO 08/03/2024

## 2024-08-03 NOTE — Progress Notes (Signed)
 Physical Therapy Treatment Patient Details Name: Wayne Molina MRN: [id number] DOB: [birth year] Today's Date: 08/03/2024   History of Present Illness Pt is a 68 y/o male presenting on 8/28 with L arm weakness, L hemianopsia, and slurred speech. 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 Course complicated by seizure episode. Intubated 8/29, extubated 9/4 but still with difficulty managing secretions.  Biopsy on 9/8. +DVT R LE 9/14.  PMH includes: anxiety, HLD, HTN, sleep apnea, chronic afib.    PT Comments  Pt seated in recliner upon arrival and eager for mobility. Pt continues to require MinA/ModA for sit<>stand repetitions with initial posterior lean. Able to ambulate short distances with brief seated rest breaks in between. Required MinA to assist with RW management when turning with +2 for chair follow. Frequent cueing for technique with increased time to follow commands. Pt was initially able to perform pericare in standing, however, needed assist to complete. Continue to recommend >3hrs post acute rehab with acute PT to follow.    If plan is discharge home, recommend the following: Two people to help with walking and/or transfers;Two people to help with bathing/dressing/bathroom;Assistance with cooking/housework;Assistance with feeding;Direct supervision/assist for medications management;Assist for transportation;Direct supervision/assist for financial management;Supervision due to cognitive status     Equipment Recommendations  Wheelchair (measurements PT);Wheelchair cushion (measurements PT)       Precautions / Restrictions Precautions Precautions: Fall Recall of Precautions/Restrictions: Impaired Precaution/Restrictions Comments: delirium prevention; SBP <160 Restrictions Weight Bearing Restrictions Per Provider Order: No     Mobility  Bed Mobility  General bed mobility comments: in recliner     Transfers Overall transfer level: Needs assistance Equipment used: Rolling walker (2 wheels) Transfers: Sit to/from Stand Sit to Stand: Min assist, Mod assist    General transfer comment: MinA to ModA for sit<>stand repetitions with assist to boost up. Cues for hand placement    Ambulation/Gait Ambulation/Gait assistance: Min assist, +2 safety/equipment Gait Distance (Feet): 15 Feet (x15, x30, x15) Assistive device: Rolling walker (2 wheels) Gait Pattern/deviations: Step-to pattern, Trunk flexed, Decreased stride length Gait velocity: decr     General Gait Details: short and shuffling steps with cues for RW proximity and upright posture. Slightly able to increase step length with cueing. MinA for RW management    Modified Rankin (Stroke Patients Only) Modified Rankin (Stroke Patients Only) Pre-Morbid Rankin Score: No symptoms Modified Rankin: Moderately severe disability     Balance Overall balance assessment: Needs assistance Sitting-balance support: No upper extremity supported, Feet supported Sitting balance-Leahy Scale: Good     Standing balance support: Bilateral upper extremity supported, Reliant on assistive device for balance, During functional activity Standing balance-Leahy Scale: Poor Standing balance comment: reliant on UE and external support       Communication Communication Communication: Impaired Factors Affecting Communication: Reduced clarity of speech  Cognition Arousal: Alert Behavior During Therapy: Flat affect   PT - Cognitive impairments: Sequencing, Problem solving, Safety/Judgement    Following commands: Impaired Following commands impaired: Follows multi-step commands inconsistently, Follows one step commands with increased time    Cueing Cueing Techniques: Verbal cues, Tactile cues, Visual cues         Pertinent Vitals/Pain Pain Assessment Pain Assessment: No/denies pain     PT Goals (current goals can now be found in the care  plan section) Acute Rehab PT Goals PT Goal Formulation: With patient/family Time For Goal Achievement: 08/11/24 Potential to Achieve Goals: Good Progress  towards PT goals: Progressing toward goals    Frequency    Min 3X/week       AM-PAC PT 6 Clicks Mobility   Outcome Measure  Help needed turning from your back to your side while in a flat bed without using bedrails?: A Little Help needed moving from lying on your back to sitting on the side of a flat bed without using bedrails?: A Little Help needed moving to and from a bed to a chair (including a wheelchair)?: A Little Help needed standing up from a chair using your arms (e.g., wheelchair or bedside chair)?: A Little Help needed to walk in hospital room?: A Lot Help needed climbing 3-5 steps with a railing? : Total 6 Click Score: 15    End of Session Equipment Utilized During Treatment: Gait belt Activity Tolerance: Patient tolerated treatment well Patient left: in chair;with call bell/phone within reach;with chair alarm set;with family/visitor present Nurse Communication: Mobility status PT Visit Diagnosis: Unsteadiness on feet (R26.81);Other abnormalities of gait and mobility (R26.89);Muscle weakness (generalized) (M62.81);Difficulty in walking, not elsewhere classified (R26.2);Other symptoms and signs involving the nervous system (R29.898)     Time: 8593-8568 PT Time Calculation (min) (ACUTE ONLY): 25 min  Charges:    $Gait Training: 8-22 mins $Therapeutic Activity: 8-22 mins PT General Charges $$ ACUTE PT VISIT: 1 Visit                    Kate ORN, PT, DPT Secure Chat Preferred  Rehab Office [phone]   Kate BRAVO [name] 08/03/2024, 3:24 PM

## 2024-08-03 NOTE — Assessment & Plan Note (Signed)
 Urinary retention - Foley in place (failed void trail); on Flomax  0.4mg  HTN - controlled, continue home Coreg  12.5 mg BID. Holding home Losartan . Discontinued hydralazine  9/12. Systolic goal <160.  HLD - Continue home Atorvastatin  40mg  daily

## 2024-08-03 NOTE — Assessment & Plan Note (Addendum)
 Has had two episodes of severe leg pain since 9/23. Treated with oxy with improvement, though trying to avoid regular opioid use in this patient. Ferritin elevated, iron and TIBC wnl.  - START gabapentin  100 mg TID [neuro recommending this dose] - Pain regimen: acetaminophen  1000 mg q6h > to QID (so no 6am wakeups), oxycodone  5 mg q6h PRN [trying to limit opioid use]

## 2024-08-03 NOTE — Assessment & Plan Note (Signed)
 Right gastrocnemius veins. - Anticoagulation as above

## 2024-08-03 NOTE — Progress Notes (Signed)
 Speech Language Pathology Treatment: Dysphagia  Patient Details Name: JAVONNI MACKE MRN: [id number] DOB: 19-Nov-1955 Today's Date: 08/03/2024 Time: 8874-8854 SLP Time Calculation (min) (ACUTE ONLY): 20 min  Assessment / Plan / Recommendation Clinical Impression  Patient seen by SLP for skilled treatment focused on dysphagia goals. His spouse was in the room and patient had just gotten into recliner after NT assisted him to the bathroom. He indicated that he is still having some trouble sleeping and this is from constipation. His spouse told SLP he is eating much better and proudly stating I have pictures showing this. Patient told SLP he has been using his EMST trainer. He was able to demonstrate improved accuracy and consistency when performing Masako exercise and he independently performed effortful swallows when taking straw sips of thin liquids. No overt s/s aspiration observed as had been in previous sessions, though patient did exhibit mild wet sounding voice. Vocal quality continues to be hoarse but is improving. SLP will continue to follow.    HPI HPI: Gerardo L. Ammon is a 68 y.o. man who presented to [facility] 8/28 with AMS. MRI large infiltrative tumor with heterogeneous enhancement tracking from the splenium of corpus callosum near midline into right temporal lobe. Course complicated by seizure; intubated 8/29-9/5.  Underwent craniotomy and biopsy 9/8 with result of glioblastoma multiforme stage IV. Has cortrak; on dysphagia 3/thin liquids. PMHx significant for HTN, HLD, Afib on Eliquis , untreated OSA.      SLP Plan  Continue with current plan of care          Recommendations  Diet recommendations: Dysphagia 3 (mechanical soft);Thin liquid Liquids provided via: Cup;Straw Medication Administration: Other (Comment) Supervision: Full supervision/cueing for compensatory strategies Compensations: Minimize environmental distractions;Slow rate;Small sips/bites Postural Changes and/or Swallow  Maneuvers: Seated upright 90 degrees;Upright 30-60 min after meal                  Oral care BID   Intermittent Supervision/Assistance Dysphagia, oropharyngeal phase (R13.12)     Continue with current plan of care    Norleen IVAR Blase, MA, CCC-SLP Speech Therapy

## 2024-08-03 NOTE — Assessment & Plan Note (Signed)
 Continues to have increased PO. Patient not wanting PEG tube, but open to discussion if needed. - RD consulted, appreciate recs - Ensure QID - Protonix  40 mg daily - Consider mirtazapine or Marinol for appetite stimulation as needed

## 2024-08-03 NOTE — Progress Notes (Signed)
 Daily Progress Note Intern Pager: [phone]  Patient name: Wayne Molina Medical record number: [id number] Date of birth: [birth year] Age: 68 y.o. Gender: male  Primary Care Provider: Janna Ferrier, DO Consultants: Neurosurgery (signed-off), neurology (signed-off), oncology, palliative  Code Status: DNR-Limited  Pt Overview and Major Events to Date:  8/29-admitted, focal seizure in ED, intubated, to the ICU 9/4-extubated 9/8-neurosurgery completed open craniotomy for tumor biopsy and confirmed glioblastoma 9/10-care resumed from ICU 9/15-started on heparin  9/18-switched to Eliquis  9/23-family meeting  Medical Decision Making:  Wayne Molina is a 68 y.o. male with PMH of A fib, OSA, osteoporosis, HTN, HLD admitted for seizure and found to have glioblastoma multiform. Patient medically stable for discharge to CIR; awaiting eval by [facility] doctor in continued workup for CIR. 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 - Palliative consulted, appreciate assistance in patient's care - Oncology consulted, appreciate recommendations - Repeat stat CT head if there is acute change in mental status or new neurological deficits - Keppra  1000 mg BID - Versed  prn for clinical seizures - PO Dexamethasone  2 mg Q8H (8am, 1pm, and 6pm along with other nightly medications at 6pm) - Taper per neurology: reduce to 2 mg q12h after 2 weeks (on 10/1), then after 2 weeks reduce to 2mg  once daily (on 10/15) - Glucose regimen: Lantus  8u at bedtime and SSI TID with meals while on steroids to maintain glucose 140-180 - Eliquis  5 mg BID - Cough: Guaifenesin  15 mL q6h prn, throat spray prn - Bowel Regimen: Lactulose  10 g TID, pepto bismol 30 mL q4h PRN; START senna daily - Nausea regimen: Zofran  4 mg PRN - Continuous cardiac monitoring - PT/OT following - SLP following - Fall and seizure precautions - AM Labs: Holiday - PMNR evaluating for CIR  readiness Bilateral leg pain Has had two episodes of severe leg pain since 9/23. Treated with oxy with improvement, though trying to avoid regular opioid use in this patient. Ferritin elevated, iron and TIBC wnl.  - START gabapentin  100 mg TID [neuro recommending this dose] - Pain regimen: acetaminophen  1000 mg q6h > to QID (so no 6am wakeups), oxycodone  5 mg q6h PRN [trying to limit opioid use] Malnutrition of moderate degree Continues to have increased PO. Patient not wanting PEG tube, but open to discussion if needed. - RD consulted, appreciate recs - Ensure QID - Protonix  40 mg daily - Consider mirtazapine or Marinol for appetite stimulation as needed Wound of gluteal cleft Irritant contact dermatitis due to fecal incontinence Suspected fungal etiology. - Clotrimazole  cream twice daily - Gerhardt's butt cream TID  - Desitin 40% ointment  - Turn patient every 2 hours Atrial fibrillation with RVR (HCC) Ventricular tachycardia (HCC) Occasional asymptomatic runs of Vtach. None recently. - Coreg  12.5 mg twice daily; consider increased dose if sustained run of Vtach - Eliquis  5 mg BID Thrombosis/embolism, venous Right gastrocnemius veins. - Anticoagulation as above Sundowning - Seroquel  25 mg at 10pm; additional Seroquel  12.5 mg PRN at 8pm - Melatonin 3 mg at 10pm Chronic health problem Urinary retention - Foley in place (failed void trail); on Flomax  0.4mg  HTN - controlled, continue home Coreg  12.5 mg BID. Holding home Losartan . Discontinued hydralazine  9/12. Systolic goal <160.  HLD - Continue home Atorvastatin  40mg  daily   FEN/GI: Dys 3 PPx: Eliquis  5 mg BID Dispo: CIR pending eval by PMNR and bed availability  Subjective:  Patient was seen with wife present at bedside and participating in discussion  this morning.  He had finished breakfast, which he had eaten most of.  Overall he reports he is doing well, still with some feeling of constipation, but improved after earlier  Pepto-Bismol.  Had a bowel movement yesterday, soft.  No other concerns.  Has been moving and walking well with PT/OT.  Objective: Temp:  [97.3 F (36.3 C)-98.7 F (37.1 C)] 98.6 F (37 C) (09/25 0729) Pulse Rate:  [63-86] 77 (09/25 0729) Resp:  [16-19] 16 (09/25 0729) BP: (111-143)/(63-89) 125/89 (09/25 0729) SpO2:  [93 %-100 %] 96 % (09/25 0729) Physical Exam: General: Patient in bed with head of bed elevated, finishing breakfast, no acute distress. Cardiovascular: Regularly irregular, no murmurs/rubs/gallops. Respiratory: Normal work of breathing on room air.  Clear to auscultation anteriorly bilaterally. Abdomen: Bowel sounds present.  Soft, some stool burden, no pain to palpation. Extremities: Warm, dry.  No bilateral lower extremity edema.  DP pulses 2+.  Laboratory: Most recent CBC Lab Results  Component Value Date   WBC 11.4 (H) 08/03/2024   HGB 14.3 08/03/2024   HCT 42.7 08/03/2024   MCV 90.9 08/03/2024   PLT 357 08/03/2024   Most recent BMP    Latest Ref Rng & Units 08/03/2024    4:49 AM  BMP  Glucose 70 - 99 mg/dL 869   BUN 8 - 23 mg/dL 27   Creatinine 9.38 - 1.24 mg/dL 9.02   Sodium 864 - 854 mmol/L 132   Potassium 3.5 - 5.1 mmol/L 5.1   Chloride 98 - 111 mmol/L 103   CO2 22 - 32 mmol/L 26   Calcium  8.9 - 10.3 mg/dL 8.3   Mag 2.0  Phos 3.5  Iron/TIBC/Ferritin/ %Sat    Component Value Date/Time   IRON 136 08/03/2024 0449   TIBC 262 08/03/2024 0449   FERRITIN 648 (H) 08/03/2024 0449   IRONPCTSAT 52 (H) 08/03/2024 0449    Larraine Palma, MD 08/03/2024, 9:02 AM  PGY-1, [facility] FPTS Intern pager: [phone], text pages welcome Secure chat group [facility] Teaching Service

## 2024-08-03 NOTE — Assessment & Plan Note (Signed)
 Suspected fungal etiology. - Clotrimazole  cream twice daily - Gerhardt's butt cream TID  - Desitin 40% ointment  - Turn patient every 2 hours

## 2024-08-03 NOTE — Assessment & Plan Note (Addendum)
-   Palliative consulted, appreciate assistance in patient's care - Oncology consulted, appreciate recommendations - Repeat stat CT head if there is acute change in mental status or new neurological deficits - Keppra  1000 mg BID - Versed  prn for clinical seizures - PO Dexamethasone  2 mg Q8H (8am, 1pm, and 6pm along with other nightly medications at 6pm) - Taper per neurology: reduce to 2 mg q12h after 2 weeks (on 10/1), then after 2 weeks reduce to 2mg  once daily (on 10/15) - Glucose regimen: Lantus  8u at bedtime and SSI TID with meals while on steroids to maintain glucose 140-180 - Eliquis  5 mg BID - Cough: Guaifenesin  15 mL q6h prn, throat spray prn - Bowel Regimen: Lactulose  10 g TID, pepto bismol 30 mL q4h PRN; START senna daily - Nausea regimen: Zofran  4 mg PRN - Continuous cardiac monitoring - PT/OT following - SLP following - Fall and seizure precautions - AM Labs: Holiday - PMNR evaluating for CIR readiness

## 2024-08-03 NOTE — Progress Notes (Signed)
 Inpatient Rehab Admissions Coordinator:   Will submit for insurance auth today.   Reche Lowers, PT, DPT Admissions Coordinator [phone] 08/03/24  12:41 PM

## 2024-08-03 NOTE — TOC Progression Note (Signed)
 Transition of Care [facility]) - Progression Note    Patient Details  Name: Wayne Molina MRN: [id number] Date of Birth: [birth year]  [facility]) CM/SW Contact  Andrez JULIANNA George, RN Phone Number: 08/03/2024, 3:44 PM  Clinical Narrative:     Cortrak out. CIR starting insurance for rehab.  IP Care management following.  Expected Discharge Plan: IP Rehab Facility Barriers to Discharge: Continued Medical Work up               Expected Discharge Plan and Services       Living arrangements for the past 2 months: Single Family Home                                       Social Drivers of Health (SDOH) Interventions SDOH Screenings   Food Insecurity: No Food Insecurity (03/22/2024)  Housing: Low Risk  (03/22/2024)  Transportation Needs: No Transportation Needs (03/22/2024)  Utilities: Not At Risk (04/16/2023)  Alcohol Screen: Low Risk  (04/16/2023)  Depression (PHQ2-9): Low Risk  (03/23/2024)  Financial Resource Strain: Low Risk  (03/22/2024)  Physical Activity: Sufficiently Active (03/22/2024)  Social Connections: Unknown (03/22/2024)  Stress: No Stress Concern Present (03/22/2024)  Tobacco Use: Medium Risk (07/07/2024)    Readmission Risk Interventions     No data to display

## 2024-08-03 NOTE — Assessment & Plan Note (Signed)
 Occasional asymptomatic runs of Vtach. None recently. - Coreg  12.5 mg twice daily; consider increased dose if sustained run of Vtach - Eliquis  5 mg BID

## 2024-08-03 NOTE — Progress Notes (Signed)
 Nutrition Follow-up  DOCUMENTATION CODES:  Non-severe (moderate) malnutrition in context of chronic illness  INTERVENTION:  Continue current diet as ordered Encourage PO intake Family to assist with ordering meals Ensure Plus High Protein po QID, each supplement provides 350 kcal and 20 grams of protein, pt prefers chocolate  NUTRITION DIAGNOSIS:  Moderate Malnutrition related to chronic illness as evidenced by moderate fat depletion, severe muscle depletion. - remains applicable  GOAL:  Patient will meet greater than or equal to 90% of their needs - progressing  MONITOR:  PO intake, TF tolerance, I & O's, Skin  REASON FOR ASSESSMENT:  Consult, Ventilator Enteral/tube feeding initiation and management  ASSESSMENT:  Pt admitted with AMS, found to have new brain mass. PMH significant for afib on eliquis , HTN, HLD.  8/28: admitted 8/29: intubated d/t acute hypoxic respiratory failure 8/31: tube feeding initiated 9/2 - Cortrak placed, tip gastric  9/4 - extubated 9/8 - Op, right craniotomy to obtain brain tumor biopsy 9/9 - SLP BSE, DYS2, thin 9/10 - Diet changed to [facility] 9/22 - TF discontinued per MD request 9/24 - cortrak removed  Pt speaking with MD at the time of visit and SLP in room in the middle of treatment. SLP able to report that pt told him intake was much improved and that he had been able to consume more. Will continue current interventions at this time.   Admit weight: 73.9 kg  Current weight: 77 kg    Nutritionally Relevant Medications: Scheduled Meds:  atorvastatin   40 mg Oral Daily   dexamethasone   2 mg Oral TID   Ensure Plus High Protein  237 mL Oral TID PC & HS   insulin  aspart  0-9 Units Subcutaneous TID WC   insulin  glargine  8 Units Subcutaneous QHS   lactulose   10 g Oral TID   levETIRAcetam   1,000 mg Oral BID   pantoprazole   40 mg Oral Daily   PRN Meds: ondansetron    Labs Reviewed: BUN 29 CBG ranges from 90-235 mg/dL over the last 24  hours HgbA1c 6.0% (8/29)  NUTRITION - FOCUSED PHYSICAL EXAM: Flowsheet Row Most Recent Value  Orbital Region Moderate depletion  Upper Arm Region Mild depletion  Thoracic and Lumbar Region Mild depletion  Buccal Region Moderate depletion  Temple Region Severe depletion  Clavicle Bone Region Moderate depletion  Clavicle and Acromion Bone Region Moderate depletion  Scapular Bone Region Unable to assess  Dorsal Hand Moderate depletion  Patellar Region Severe depletion  Anterior Thigh Region Severe depletion  Posterior Calf Region Moderate depletion  Edema (RD Assessment) None  Hair Reviewed  Eyes Reviewed  Mouth Reviewed  Skin Reviewed  Nails Reviewed    Diet Order:   Diet Order             DIET DYS 3 Room service appropriate? Yes with Assist; Fluid consistency: Thin  Diet effective now                   EDUCATION NEEDS:  Education needs have been addressed  Skin:  Skin Assessment: Skin Integrity Issues: (irritant dermatitis, buttocks)  Last BM:  9/25 - type 6  Height:  Ht Readings from Last 1 Encounters:  07/07/24 5' 9 (1.753 m)    Weight:  Wt Readings from Last 1 Encounters:  07/28/24 77 kg    Ideal Body Weight:  72.7 kg  BMI:  Body mass index is 25.07 kg/m.  Estimated Nutritional Needs:  Kcal:  1800-2000 Protein:  95-110g Fluid:  >/=1.8L  Wayne Molina, RD, LDN, CNSC Registered Dietitian II Please reach out via secure chat

## 2024-08-03 NOTE — Telephone Encounter (Signed)
 PC to patient's wife, [name] - informed her we have patient scheduled to see Dr [name] 08/10/24 at 10:00.  If he has not been DC'd from rehab, we can change this appointment.  She verbalizes understanding.

## 2024-08-03 NOTE — Progress Notes (Signed)
 Inpatient Rehab Admissions Coordinator:   Contacted by [facility] to request pre-determination peer to peer information from Attending service.  They would not accept PMR physician's contact information.  Dr. Tharon agreed to do P2P, if requested by [facility], for attending service.  Will provide them his info and availability for tomorrow.    Reche Lowers, PT, DPT Admissions Coordinator [phone] 08/03/24  4:29 PM

## 2024-08-04 ENCOUNTER — Inpatient Hospital Stay (HOSPITAL_COMMUNITY)
Admission: AD | Admit: 2024-08-04 | Discharge: 2024-08-09 | DRG: 945 | Disposition: A | Discharge status: Home Health | Source: Intra-hospital | Attending: Physical Medicine & Rehabilitation | Admitting: Physical Medicine & Rehabilitation

## 2024-08-04 ENCOUNTER — Encounter (HOSPITAL_COMMUNITY): Payer: Self-pay | Admitting: Physical Medicine & Rehabilitation

## 2024-08-04 ENCOUNTER — Other Ambulatory Visit: Payer: Self-pay

## 2024-08-04 DIAGNOSIS — N319 Neuromuscular dysfunction of bladder, unspecified: Secondary | ICD-10-CM | POA: Diagnosis present

## 2024-08-04 DIAGNOSIS — Z801 Family history of malignant neoplasm of trachea, bronchus and lung: Secondary | ICD-10-CM

## 2024-08-04 DIAGNOSIS — M542 Cervicalgia: Secondary | ICD-10-CM | POA: Diagnosis not present

## 2024-08-04 DIAGNOSIS — R739 Hyperglycemia, unspecified: Secondary | ICD-10-CM | POA: Diagnosis not present

## 2024-08-04 DIAGNOSIS — I482 Chronic atrial fibrillation, unspecified: Secondary | ICD-10-CM | POA: Diagnosis not present

## 2024-08-04 DIAGNOSIS — R5381 Other malaise: Secondary | ICD-10-CM | POA: Diagnosis not present

## 2024-08-04 DIAGNOSIS — K592 Neurogenic bowel, not elsewhere classified: Secondary | ICD-10-CM | POA: Diagnosis present

## 2024-08-04 DIAGNOSIS — Z79899 Other long term (current) drug therapy: Secondary | ICD-10-CM

## 2024-08-04 DIAGNOSIS — I82461 Acute embolism and thrombosis of right calf muscular vein: Secondary | ICD-10-CM | POA: Diagnosis present

## 2024-08-04 DIAGNOSIS — T380X5A Adverse effect of glucocorticoids and synthetic analogues, initial encounter: Secondary | ICD-10-CM | POA: Diagnosis present

## 2024-08-04 DIAGNOSIS — Z8249 Family history of ischemic heart disease and other diseases of the circulatory system: Secondary | ICD-10-CM | POA: Diagnosis not present

## 2024-08-04 DIAGNOSIS — G40909 Epilepsy, unspecified, not intractable, without status epilepticus: Secondary | ICD-10-CM | POA: Diagnosis not present

## 2024-08-04 DIAGNOSIS — E876 Hypokalemia: Secondary | ICD-10-CM | POA: Diagnosis not present

## 2024-08-04 DIAGNOSIS — Z833 Family history of diabetes mellitus: Secondary | ICD-10-CM

## 2024-08-04 DIAGNOSIS — K5900 Constipation, unspecified: Secondary | ICD-10-CM | POA: Diagnosis not present

## 2024-08-04 DIAGNOSIS — I10 Essential (primary) hypertension: Secondary | ICD-10-CM | POA: Diagnosis present

## 2024-08-04 DIAGNOSIS — C719 Malignant neoplasm of brain, unspecified: Secondary | ICD-10-CM | POA: Diagnosis not present

## 2024-08-04 DIAGNOSIS — R1312 Dysphagia, oropharyngeal phase: Secondary | ICD-10-CM | POA: Diagnosis present

## 2024-08-04 DIAGNOSIS — E785 Hyperlipidemia, unspecified: Secondary | ICD-10-CM | POA: Diagnosis not present

## 2024-08-04 DIAGNOSIS — G9349 Other encephalopathy: Secondary | ICD-10-CM | POA: Diagnosis not present

## 2024-08-04 DIAGNOSIS — R4589 Other symptoms and signs involving emotional state: Secondary | ICD-10-CM | POA: Diagnosis not present

## 2024-08-04 DIAGNOSIS — Z820 Family history of epilepsy and other diseases of the nervous system: Secondary | ICD-10-CM

## 2024-08-04 DIAGNOSIS — F32A Depression, unspecified: Secondary | ICD-10-CM | POA: Diagnosis present

## 2024-08-04 DIAGNOSIS — E1165 Type 2 diabetes mellitus with hyperglycemia: Secondary | ICD-10-CM | POA: Diagnosis present

## 2024-08-04 DIAGNOSIS — G4733 Obstructive sleep apnea (adult) (pediatric): Secondary | ICD-10-CM | POA: Diagnosis not present

## 2024-08-04 DIAGNOSIS — I422 Other hypertrophic cardiomyopathy: Secondary | ICD-10-CM | POA: Diagnosis not present

## 2024-08-04 DIAGNOSIS — Z7901 Long term (current) use of anticoagulants: Secondary | ICD-10-CM

## 2024-08-04 DIAGNOSIS — F419 Anxiety disorder, unspecified: Secondary | ICD-10-CM | POA: Diagnosis not present

## 2024-08-04 DIAGNOSIS — R269 Unspecified abnormalities of gait and mobility: Secondary | ICD-10-CM | POA: Diagnosis not present

## 2024-08-04 DIAGNOSIS — L89152 Pressure ulcer of sacral region, stage 2: Secondary | ICD-10-CM | POA: Diagnosis present

## 2024-08-04 DIAGNOSIS — Z800 Family history of malignant neoplasm of digestive organs: Secondary | ICD-10-CM

## 2024-08-04 DIAGNOSIS — Z803 Family history of malignant neoplasm of breast: Secondary | ICD-10-CM

## 2024-08-04 DIAGNOSIS — Z860100 Personal history of colon polyps, unspecified: Secondary | ICD-10-CM

## 2024-08-04 DIAGNOSIS — Z515 Encounter for palliative care: Secondary | ICD-10-CM

## 2024-08-04 DIAGNOSIS — I959 Hypotension, unspecified: Secondary | ICD-10-CM | POA: Diagnosis present

## 2024-08-04 DIAGNOSIS — Z87891 Personal history of nicotine dependence: Secondary | ICD-10-CM

## 2024-08-04 DIAGNOSIS — Z83719 Family history of colon polyps, unspecified: Secondary | ICD-10-CM

## 2024-08-04 DIAGNOSIS — Z95818 Presence of other cardiac implants and grafts: Secondary | ICD-10-CM

## 2024-08-04 LAB — GLUCOSE, CAPILLARY
Glucose-Capillary: 130 mg/dL — ABNORMAL HIGH (ref 70–99)
Glucose-Capillary: 191 mg/dL — ABNORMAL HIGH (ref 70–99)
Glucose-Capillary: 178 mg/dL — ABNORMAL HIGH (ref 70–99)
Glucose-Capillary: 204 mg/dL — ABNORMAL HIGH (ref 70–99)

## 2024-08-04 MED ORDER — ORAL CARE MOUTH RINSE
15.0000 mL | OROMUCOSAL | Status: DC
  Administered 2024-08-05 – 2024-08-09 (×15): 15 mL via OROMUCOSAL

## 2024-08-04 MED ORDER — GERHARDT'S BUTT CREAM: 1.0000 | TOPICAL_CREAM | Freq: Three times a day (TID) | CUTANEOUS | Status: DC

## 2024-08-04 MED ORDER — PANTOPRAZOLE SODIUM 40 MG PO TBEC
40.0000 mg | DELAYED_RELEASE_TABLET | Freq: Every day | ORAL | Status: DC
  Administered 2024-08-05 – 2024-08-09 (×5): 40 mg via ORAL
  Filled 2024-08-04 (×5): qty 1

## 2024-08-04 MED ORDER — ACETAMINOPHEN 325 MG PO TABS: 325.0000 mg | ORAL_TABLET | ORAL | Status: DC | PRN

## 2024-08-04 MED ORDER — BISMUTH SUBSALICYLATE 262 MG/15ML PO SUSP: 30.0000 mL | ORAL | Status: DC | PRN

## 2024-08-04 MED ORDER — ONDANSETRON HCL 4 MG PO TABS: 4.0000 mg | ORAL_TABLET | ORAL | Status: DC | PRN

## 2024-08-04 MED ORDER — CLOTRIMAZOLE 1 % EX CREA: TOPICAL_CREAM | Freq: Two times a day (BID) | CUTANEOUS | Status: DC

## 2024-08-04 MED ORDER — CARVEDILOL 12.5 MG PO TABS
12.5000 mg | ORAL_TABLET | Freq: Two times a day (BID) | ORAL | Status: DC
  Administered 2024-08-04 – 2024-08-08 (×9): 12.5 mg via ORAL
  Filled 2024-08-04 (×9): qty 1

## 2024-08-04 MED ORDER — DEXAMETHASONE 2 MG PO TABS: ORAL_TABLET | ORAL | Status: DC

## 2024-08-04 MED ORDER — TAMSULOSIN HCL 0.4 MG PO CAPS: 0.4000 mg | ORAL_CAPSULE | Freq: Every day | ORAL | Status: DC

## 2024-08-04 MED ORDER — APIXABAN 5 MG PO TABS
5.0000 mg | ORAL_TABLET | Freq: Two times a day (BID) | ORAL | Status: DC
  Administered 2024-08-04 – 2024-08-09 (×10): 5 mg via ORAL
  Filled 2024-08-04 (×10): qty 1

## 2024-08-04 MED ORDER — MELATONIN 3 MG PO TABS
3.0000 mg | ORAL_TABLET | Freq: Every day | ORAL | Status: DC
  Administered 2024-08-04 – 2024-08-08 (×5): 3 mg via ORAL
  Filled 2024-08-04 (×5): qty 1

## 2024-08-04 MED ORDER — INSULIN ASPART 100 UNIT/ML IJ SOLN
0.0000 [IU] | Freq: Three times a day (TID) | INTRAMUSCULAR | Status: DC
  Administered 2024-08-04: 2 [IU] via SUBCUTANEOUS
  Administered 2024-08-05: 1 [IU] via SUBCUTANEOUS
  Administered 2024-08-05: 2 [IU] via SUBCUTANEOUS
  Administered 2024-08-06 (×2): 1 [IU] via SUBCUTANEOUS
  Administered 2024-08-06: 2 [IU] via SUBCUTANEOUS
  Administered 2024-08-07 (×2): 3 [IU] via SUBCUTANEOUS
  Administered 2024-08-07 – 2024-08-08 (×2): 2 [IU] via SUBCUTANEOUS
  Administered 2024-08-08: 1 [IU] via SUBCUTANEOUS
  Administered 2024-08-08 – 2024-08-09 (×2): 2 [IU] via SUBCUTANEOUS

## 2024-08-04 MED ORDER — QUETIAPINE FUMARATE 25 MG PO TABS: 25.0000 mg | ORAL_TABLET | Freq: Every day | ORAL | Status: DC

## 2024-08-04 MED ORDER — QUETIAPINE FUMARATE 25 MG PO TABS
25.0000 mg | ORAL_TABLET | Freq: Every day | ORAL | Status: DC
  Administered 2024-08-04 – 2024-08-08 (×5): 25 mg via ORAL
  Filled 2024-08-04 (×5): qty 1

## 2024-08-04 MED ORDER — GERHARDT'S BUTT CREAM
TOPICAL_CREAM | Freq: Three times a day (TID) | CUTANEOUS | Status: DC
  Administered 2024-08-07 – 2024-08-08 (×2): 1 via TOPICAL
  Filled 2024-08-04: qty 60

## 2024-08-04 MED ORDER — CARVEDILOL 12.5 MG PO TABS: 12.5000 mg | ORAL_TABLET | Freq: Two times a day (BID) | ORAL | Status: DC

## 2024-08-04 MED ORDER — INSULIN ASPART 100 UNIT/ML IJ SOLN: 0.0000 [IU] | Freq: Three times a day (TID) | INTRAMUSCULAR | Status: DC

## 2024-08-04 MED ORDER — DEXAMETHASONE 2 MG PO TABS: 2.0000 mg | ORAL_TABLET | Freq: Two times a day (BID) | ORAL | Status: DC

## 2024-08-04 MED ORDER — ORAL CARE MOUTH RINSE: 15.0000 mL | Freq: Four times a day (QID) | OROMUCOSAL | Status: DC

## 2024-08-04 MED ORDER — DEXAMETHASONE 2 MG PO TABS: 2.0000 mg | ORAL_TABLET | Freq: Every day | ORAL | Status: DC

## 2024-08-04 MED ORDER — LEVETIRACETAM 1000 MG PO TABS: 1000.0000 mg | ORAL_TABLET | Freq: Two times a day (BID) | ORAL | Status: DC

## 2024-08-04 MED ORDER — ZINC OXIDE 40 % EX OINT: TOPICAL_OINTMENT | Freq: Two times a day (BID) | CUTANEOUS | Status: DC | PRN

## 2024-08-04 MED ORDER — SENNA 8.6 MG PO TABS
1.0000 | ORAL_TABLET | Freq: Every day | ORAL | Status: DC
  Administered 2024-08-04 – 2024-08-09 (×6): 8.6 mg via ORAL
  Filled 2024-08-04 (×6): qty 1

## 2024-08-04 MED ORDER — MELATONIN 3 MG PO TABS: 3.0000 mg | ORAL_TABLET | Freq: Every day | ORAL | Status: DC

## 2024-08-04 MED ORDER — OXYCODONE HCL 5 MG PO TABS
5.0000 mg | ORAL_TABLET | Freq: Four times a day (QID) | ORAL | Status: DC | PRN
  Filled 2024-08-04: qty 1

## 2024-08-04 MED ORDER — LACTULOSE 10 GM/15ML PO SOLN
10.0000 g | Freq: Three times a day (TID) | ORAL | Status: DC
  Administered 2024-08-04 – 2024-08-09 (×14): 10 g via ORAL
  Filled 2024-08-04 (×14): qty 15

## 2024-08-04 MED ORDER — GABAPENTIN 100 MG PO CAPS
100.0000 mg | ORAL_CAPSULE | Freq: Three times a day (TID) | ORAL | Status: DC
  Administered 2024-08-04 – 2024-08-09 (×14): 100 mg via ORAL
  Filled 2024-08-04 (×14): qty 1

## 2024-08-04 MED ORDER — DEXAMETHASONE 2 MG PO TABS
2.0000 mg | ORAL_TABLET | Freq: Three times a day (TID) | ORAL | Status: DC
  Administered 2024-08-04 – 2024-08-09 (×14): 2 mg via ORAL
  Filled 2024-08-04 (×14): qty 1

## 2024-08-04 MED ORDER — ACETAMINOPHEN 500 MG PO TABS: 1000.0000 mg | ORAL_TABLET | Freq: Four times a day (QID) | ORAL | Status: DC

## 2024-08-04 MED ORDER — INSULIN GLARGINE 100 UNIT/ML ~~LOC~~ SOLN
8.0000 [IU] | Freq: Every day | SUBCUTANEOUS | Status: DC
  Administered 2024-08-04 – 2024-08-08 (×4): 8 [IU] via SUBCUTANEOUS
  Filled 2024-08-04 (×6): qty 0.08

## 2024-08-04 MED ORDER — GABAPENTIN 100 MG PO CAPS: 100.0000 mg | ORAL_CAPSULE | Freq: Three times a day (TID) | ORAL | Status: DC

## 2024-08-04 MED ORDER — QUETIAPINE FUMARATE 25 MG PO TABS
12.5000 mg | ORAL_TABLET | Freq: Every evening | ORAL | Status: DC | PRN
  Filled 2024-08-04: qty 1

## 2024-08-04 MED ORDER — CLOTRIMAZOLE 1 % EX CREA
TOPICAL_CREAM | Freq: Two times a day (BID) | CUTANEOUS | Status: DC
  Administered 2024-08-06 – 2024-08-08 (×3): 1 via TOPICAL
  Filled 2024-08-04: qty 15

## 2024-08-04 MED ORDER — PANTOPRAZOLE SODIUM 40 MG PO TBEC: 40.0000 mg | DELAYED_RELEASE_TABLET | Freq: Every day | ORAL | Status: DC

## 2024-08-04 MED ORDER — INSULIN GLARGINE 100 UNIT/ML ~~LOC~~ SOLN: 8.0000 [IU] | Freq: Every day | SUBCUTANEOUS | Status: DC

## 2024-08-04 MED ORDER — ATORVASTATIN CALCIUM 40 MG PO TABS
40.0000 mg | ORAL_TABLET | Freq: Every day | ORAL | Status: DC
  Administered 2024-08-05 – 2024-08-09 (×5): 40 mg via ORAL
  Filled 2024-08-04 (×5): qty 1

## 2024-08-04 MED ORDER — TAMSULOSIN HCL 0.4 MG PO CAPS
0.4000 mg | ORAL_CAPSULE | Freq: Every day | ORAL | Status: DC
  Administered 2024-08-05 – 2024-08-09 (×5): 0.4 mg via ORAL
  Filled 2024-08-04 (×5): qty 1

## 2024-08-04 MED ORDER — BISMUTH SUBSALICYLATE 262 MG/15ML PO SUSP
30.0000 mL | ORAL | Status: DC | PRN
  Administered 2024-08-05: 30 mL via ORAL

## 2024-08-04 MED ORDER — ONDANSETRON HCL 4 MG/2ML IJ SOLN: 4.0000 mg | INTRAMUSCULAR | Status: DC | PRN

## 2024-08-04 MED ORDER — LEVETIRACETAM 500 MG PO TABS
1000.0000 mg | ORAL_TABLET | Freq: Two times a day (BID) | ORAL | Status: DC
  Administered 2024-08-04 – 2024-08-09 (×10): 1000 mg via ORAL
  Filled 2024-08-04 (×10): qty 2

## 2024-08-04 MED ORDER — ENSURE PLUS HIGH PROTEIN PO LIQD: 237.0000 mL | Freq: Three times a day (TID) | ORAL | Status: DC

## 2024-08-04 MED ORDER — GUAIFENESIN 100 MG/5ML PO LIQD: 15.0000 mL | Freq: Four times a day (QID) | ORAL | Status: DC | PRN

## 2024-08-04 MED ORDER — SENNA 8.6 MG PO TABS: 1.0000 | ORAL_TABLET | Freq: Every day | ORAL | Status: DC

## 2024-08-04 MED ORDER — ALBUTEROL SULFATE (2.5 MG/3ML) 0.083% IN NEBU
2.5000 mg | INHALATION_SOLUTION | Freq: Four times a day (QID) | RESPIRATORY_TRACT | Status: DC | PRN
  Administered 2024-08-07 (×2): 2.5 mg via RESPIRATORY_TRACT
  Filled 2024-08-04 (×2): qty 3

## 2024-08-04 MED ORDER — PHENOL 1.4 % MT LIQD: 1.0000 | OROMUCOSAL | Status: DC | PRN

## 2024-08-04 MED ORDER — ALBUTEROL SULFATE (2.5 MG/3ML) 0.083% IN NEBU: 2.5000 mg | INHALATION_SOLUTION | Freq: Four times a day (QID) | RESPIRATORY_TRACT | Status: DC | PRN

## 2024-08-04 MED ORDER — ENSURE PLUS HIGH PROTEIN PO LIQD
237.0000 mL | Freq: Three times a day (TID) | ORAL | Status: DC
  Administered 2024-08-04 – 2024-08-09 (×16): 237 mL via ORAL

## 2024-08-04 MED ORDER — LACTULOSE 10 GM/15ML PO SOLN: 10.0000 g | Freq: Three times a day (TID) | ORAL | Status: DC

## 2024-08-04 NOTE — Assessment & Plan Note (Signed)
 Right gastrocnemius veins. - Anticoagulation as above

## 2024-08-04 NOTE — Assessment & Plan Note (Signed)
-   Seroquel  25 mg at 10pm; additional Seroquel  12.5 mg PRN at 8pm - Melatonin 3 mg at 10pm

## 2024-08-04 NOTE — H&P (Shared)
 Physical Medicine and Rehabilitation Admission H&P    Chief Complaint  Patient presents with   Weakness   Near Syncope   Code Stroke  : HPI: Wayne Molina is a 68 year old right-handed male with history significant for atrial fibrillation status post loop recorder 2017 per Dr.Croitoru maintained on chronic Eliquis , atypical variant hypertrophic cardiomyopathy depression/anxiety, hypertension, hyperlipidemia, colonic polyps, OSA/tobacco use.  Per chart review patient lives with spouse.  1 level home with level entry.  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  Reports 1 fall when stepped off a deck a few weeks ago no loss of consciousness no other falls reported.  Presented 07/07/2024 with acute onset of left-sided weakness/slurred speech and hemianopsia while at work.  Initial CT concerning for right MCA CVA however patient had witnessed seizure in the ER repeat CT was concerning for mass.  EEG showed evidence of L lepto Jenise and cortical dysfunction arising from right hemisphere likely due to underlying structural abnormality.  Additionally there was severe diffuse encephalopathy.  No seizure was seen throughout the recording.  Patient was loaded with Keppra  for seizure prophylaxis and subsequent MRI showed infiltrating tumor concerning for glioblastoma versus lymphoma.  Admission chemistries unremarkable except glucose 125.  Hospital course patient did require intubation for airway protection 8/29 - 9/5 for Enterobacter in the setting of progressive encephalopathy.  Patient underwent right craniotomy for biopsy of brain tumor 07/17/2024 per Dr.Janjua and pathology consistent with glioblastoma multiforme.  Medical oncology Dr. [name] consulted and no current formal plan for treatment to start for 2-3 weeks and latest cranial CT scan 07/26/2024 showed no hydrocephalus without acute changes with similar vasogenic edema and mass effect..  Patient currently remains on  Decadron  therapy as indicated.  He had been cleared to resume chronic Eliquis .  Venous Doppler studies 07/23/2024 did show a right gastrocnemius vein patient remained on chronic Eliquis  as indicated.  He remains on Keppra  for seizure prophylaxis.  Palliative care was consulted to establish goals of care.  Leukocytosis 11,400-15,000 felt to be steroid-induced.  He did experience bouts of urinary retention with Foley/voiding trial and maintained on Flomax .  Patient with decreased nutritional storage initially with nasogastric tube for nutritional support currently on a dysphagia #3 thin liquid diet patient had refused gastrostomy tube placement for nutritional supplementation.  Patient has had intermittent confusion and restlessness especially at nighttime maintained on Seroquel .  Therapy evaluations completed due to patient decreased functional mobility cognitive deficits was admitted for a comprehensive rehab program.  Review of Systems  Constitutional:  Negative for chills and fever.  HENT:  Negative for hearing loss.   Eyes:  Negative for blurred vision and double vision.  Respiratory:  Negative for cough, shortness of breath and wheezing.   Cardiovascular:  Positive for palpitations. Negative for chest pain.  Gastrointestinal:  Positive for abdominal pain and constipation.  Genitourinary:  Positive for urgency. Negative for dysuria, flank pain and hematuria.       Urinary retention  Musculoskeletal:  Positive for myalgias.       Recent fall x 1 a few weeks ago  Skin:  Negative for rash.  Neurological:  Positive for speech change and focal weakness.  Psychiatric/Behavioral:  Positive for depression.        Anxiety as well as nighttime confusion  All other systems reviewed and are negative.  Past Medical History:  Diagnosis Date   Anxiety    Arthritis    fingers   Chronic atrial  fibrillation (HCC)    Depression    Family history of breast cancer    Family history of cancer of male  genital organ    Family history of gene mutation    BRIP1   Family history of malignant neoplasm of gastrointestinal tract    Hyperlipidemia    Hypertension    Personal history of colonic polyps 11/12/2006   Sleep apnea    Past Surgical History:  Procedure Laterality Date   2-D echocardiogram  07/22/2010   Ejection fraction greater than 55%. Left atrium moderately dilated. Right atrium moderately dilated. 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 Mild to Moderate MR. Mild to moderate TR.   APPLICATION OF CRANIAL NAVIGATION Right 07/17/2024   Procedure: COMPUTER-ASSISTED NAVIGATION, FOR CRANIAL PROCEDURE;  Surgeon: Rosslyn Dino HERO, MD;  Location: [facility] OR;  Service: Neurosurgery;  Laterality: Right;  CRANIAL NAVIGATION   ATRIAL ABLATION SURGERY  2004   EP IMPLANTABLE DEVICE N/A 02/11/2016   Procedure: Loop Recorder Insertion;  Surgeon: Jerel Balding, MD;  Location: [facility] INVASIVE CV LAB;  Service: Cardiovascular;  Laterality: N/A;   Exercise Myoview stress test  07/08/2000   Nonischemic low-risk.   HIP FRACTURE SURGERY  1970s   IR GENERIC HISTORICAL  01/21/2017   IR RADIOLOGIST EVAL & MGMT 01/21/2017 [facility]INTER[facility] RAD   STERIOTACTIC STIMULATOR INSERTION Right 07/17/2024   Procedure: OPEN CRANIOTOMY FOR BIOPSY;  Surgeon: Rosslyn Dino HERO, MD;  Location[facility] OR;  Service: Neurosurgery;  Laterality: Right;  RIGHT STEREOTACTIC BRAIN BIOPSY   Family History  Problem Relation Age of Onset   Dementia Mother    Heart disease Father    Diabetes Father    Other Sister        BRIP1 gene mutation   Kidney disease Brother    Colon cancer Maternal Uncle    Lung cancer Paternal Aunt        d. >50   Cancer Paternal Aunt        unknown type, d. >50   Cancer Cousin 17       gynecologic (paternal first cousin)   Colon cancer Nephew 27       arising in colon polyp   Cancer Niece 38       gynecologic; niece in her 13's   Breast cancer Niece 48   Other Niece        BRIP1 gene mutation   Cancer Paternal Great-grandmother         abdominal cancer (PGF's mother) great grandmother   Social History:  reports that he has quit smoking. His smoking use included cigarettes. He has never used smokeless tobacco. He reports that he does not drink alcohol and does not use drugs. Allergies: No Known Allergies Medications Prior to Admission  Medication Sig Dispense Refill   apixaban  (ELIQUIS ) 5 MG TABS tablet Take 1 tablet (5 mg total) by mouth 2 (two) times daily. 180 tablet 1   atorvastatin  (LIPITOR) 40 MG tablet Take 1 tablet (40 mg total) by mouth daily. 90 tablet 1   calcium  carbonate (OSCAL) 1500 (600 Ca) MG TABS tablet Take by mouth 2 (two) times daily with a meal.     Cyanocobalamin (VITAMIN B-12 PO) Take 1 capsule by mouth daily.     losartan  (COZAAR ) 100 MG tablet Take 1 tablet (100 mg total) by mouth daily. 90 tablet 3   Magnesium Oxide 250 MG TABS Take 1 tablet by mouth daily in the afternoon.     Multiple Vitamin (MULTIVITAMIN ADULT PO) Take 1 tablet by  mouth daily.     Potassium Gluconate 2 MEQ TABS Take 1 tablet by mouth daily in the afternoon.     sildenafil  (VIAGRA ) 50 MG tablet Take 2 tablets (100 mg total) by mouth daily as needed. 10 tablet 2   cyclobenzaprine  (FLEXERIL ) 5 MG tablet Take 1 tablet (5 mg total) by mouth 2 (two) times daily as needed for muscle spasms. (Patient not taking: Reported on 07/07/2024) 10 tablet 0   lidocaine  (LIDODERM ) 5 % Place 1 patch onto the skin daily. Apply to affected area on neck/shoulder and keep in place for 12 hours and remove for 12 hours (Patient not taking: Reported on 07/07/2024) 10 patch 0      Home: Home Living Family/patient expects to be discharged to:: Private residence Living Arrangements: Spouse/significant other Available Help at Discharge: Family, Available 24 hours/day Type of Home: House Home Access: Level entry Home Layout: One level Bathroom Shower/Tub: Engineer, manufacturing systems: Standard Home Equipment: None   Functional History: Prior  Function Prior Level of Function : Independent/Modified Independent, History of Falls (last six months) Mobility Comments: No AD, x1 fall when stepped off deck a few weeks ago, but no other falls ADLs Comments: Works night shift in Proofreader at [employer]  Functional Status:  Mobility: Bed Mobility Overal bed mobility: Needs Assistance Bed Mobility: Supine to Sit, Sit to Supine Rolling: Min assist Sidelying to sit: HOB elevated, Used rails, Min assist Supine to sit: Supervision, HOB elevated, Used rails Sit to supine: Min assist, HOB elevated, Used rails Sit to sidelying: Mod assist, +2 for physical assistance, +2 for safety/equipment General bed mobility comments: in recliner Transfers Overall transfer level: Needs assistance Equipment used: Rolling walker (2 wheels) Transfers: Sit to/from Stand Sit to Stand: Min assist, Mod assist Bed to/from chair/wheelchair/BSC transfer type:: Step pivot Squat pivot transfers: Total assist, +2 safety/equipment Step pivot transfers: Min assist Transfer via Lift Equipment: Stedy General transfer comment: MinA to ModA for sit<>stand repetitions with assist to boost up. Cues for hand placement Ambulation/Gait Ambulation/Gait assistance: Min assist, +2 safety/equipment Gait Distance (Feet): 15 Feet (x15, x30, x15) Assistive device: Rolling walker (2 wheels) Gait Pattern/deviations: Step-to pattern, Trunk flexed, Decreased stride length General Gait Details: short and shuffling steps with cues for RW proximity and upright posture. Slightly able to increase step length with cueing. MinA for RW management Gait velocity: decr Pre-gait activities: static standing marches    ADL: ADL Overall ADL's : Needs assistance/impaired Eating/Feeding: Set up, Bed level Eating/Feeding Details (indicate cue type and reason): sipping from straw in cup without spillage; provided meal and utensil setup on lunch tray Grooming: Wash/dry hands, Wash/dry face, Oral care,  Minimal assistance, Cueing for sequencing, Standing Grooming Details (indicate cue type and reason): cued to terminate oral care task and for completed sequencing, assist for stability Upper Body Bathing: Moderate assistance, Sitting Lower Body Bathing: Maximal assistance Upper Body Dressing : Minimal assistance, Cueing for sequencing, Bed level (HOB elevated) Upper Body Dressing Details (indicate cue type and reason): donned clean gown, cues for sequencing and assist to thoroughly thread LUE Lower Body Dressing: Contact guard assist, Sitting/lateral leans Lower Body Dressing Details (indicate cue type and reason): donned B socks via hip hike technique, inc time with LLE sock Toilet Transfer: Moderate assistance, +2 for physical assistance Toilet Transfer Details (indicate cue type and reason): stand step using RW to recliner Toileting- Clothing Manipulation and Hygiene: Total assistance Functional mobility during ADLs: Moderate assistance, +2 for physical assistance  Cognition: Cognition Overall Cognitive Status:  Impaired/Different from baseline Arousal/Alertness: Lethargic Orientation Level: Oriented X4 Year: 2025 Month: September Day of Week: Incorrect Attention: Sustained Sustained Attention: Impaired Sustained Attention Impairment: Verbal basic, Functional basic Memory: Impaired Memory Impairment: Decreased recall of new information Awareness: Impaired Awareness Impairment: Intellectual impairment Problem Solving: Impaired Problem Solving Impairment: Verbal basic, Functional basic Executive Function: Initiating Initiating: Impaired Initiating Impairment: Verbal basic, Functional basic Safety/Judgment: Impaired Cognition Arousal: Alert Behavior During Therapy: Flat affect Overall Cognitive Status: Impaired/Different from baseline  Physical Exam: Blood pressure (!) 97/57, pulse 65, temperature (!) 97.5 F (36.4 C), temperature source Oral, resp. rate 18, height 5' 9  (1.753 m), weight 77 kg, SpO2 99%. Physical Exam HENT:     Head:     Comments: Craniotomy site clean and dry Neurological:     Comments: Patient is alert sitting up in bed.  Makes eye contact with examiner.  Moderate dysarthria.  He does not name simple objects correctly.  Fair insight and awareness.     Results for orders placed or performed during the hospital encounter of 07/06/24 (from the past 48 hours)  Glucose, capillary     Status: Abnormal   Collection Time: 08/02/24  6:19 AM  Result Value Ref Range   Glucose-Capillary 124 (H) 70 - 99 mg/dL    Comment: Glucose reference range applies only to samples taken after fasting for at least 8 hours.   Comment 1 Notify RN    Comment 2 Document in Chart   Glucose, capillary     Status: Abnormal   Collection Time: 08/02/24 11:59 AM  Result Value Ref Range   Glucose-Capillary 206 (H) 70 - 99 mg/dL    Comment: Glucose reference range applies only to samples taken after fasting for at least 8 hours.   Comment 1 Notify RN    Comment 2 Document in Chart   Glucose, capillary     Status: Abnormal   Collection Time: 08/02/24  3:47 PM  Result Value Ref Range   Glucose-Capillary 172 (H) 70 - 99 mg/dL    Comment: Glucose reference range applies only to samples taken after fasting for at least 8 hours.   Comment 1 Notify RN    Comment 2 Document in Chart   Glucose, capillary     Status: Abnormal   Collection Time: 08/02/24  5:15 PM  Result Value Ref Range   Glucose-Capillary 148 (H) 70 - 99 mg/dL    Comment: Glucose reference range applies only to samples taken after fasting for at least 8 hours.  Glucose, capillary     Status: Abnormal   Collection Time: 08/02/24  9:22 PM  Result Value Ref Range   Glucose-Capillary 211 (H) 70 - 99 mg/dL    Comment: Glucose reference range applies only to samples taken after fasting for at least 8 hours.  Iron and TIBC     Status: Abnormal   Collection Time: 08/03/24  4:49 AM  Result Value Ref Range    Iron 136 45 - 182 ug/dL   TIBC 737 749 - 549 ug/dL   Saturation Ratios 52 (H) 17.9 - 39.5 %   UIBC 126 ug/dL    Comment: Performed at [facility], [address] [place], [place] [address]  Ferritin     Status: Abnormal   Collection Time: 08/03/24  4:49 AM  Result Value Ref Range   Ferritin 648 (H) 24 - 336 ng/mL    Comment: Performed at [facility], [address], [place], [place] [address]  CBC     Status: Abnormal   Collection Time: 08/03/24  4:49 AM  Result Value Ref Range   WBC 11.4 (H) 4.0 - 10.5 K/uL   RBC 4.70 4.22 - 5.81 MIL/uL   Hemoglobin 14.3 13.0 - 17.0 g/dL   HCT 57.2 60.9 - 47.9 %   MCV 90.9 80.0 - 100.0 fL   MCH 30.4 26.0 - 34.0 pg   MCHC 33.5 30.0 - 36.0 g/dL   RDW 87.1 88.4 - 84.4 %   Platelets 357 150 - 400 K/uL   nRBC 0.0 0.0 - 0.2 %    Comment: Performed at [facility], [address] [place], [place] [address]  Magnesium     Status: None   Collection Time: 08/03/24  4:49 AM  Result Value Ref Range   Magnesium 2.0 1.7 - 2.4 mg/dL    Comment: Performed at [facility], [address] [place], [place] [address]  Phosphorus     Status: None   Collection Time: 08/03/24  4:49 AM  Result Value Ref Range   Phosphorus 3.5 2.5 - 4.6 mg/dL    Comment: Performed at [facility], [address], [place], [place] [address]  Basic metabolic panel     Status: Abnormal   Collection Time: 08/03/24  4:49 AM  Result Value Ref Range   Sodium 132 (L) 135 - 145 mmol/L   Potassium 5.1 3.5 - 5.1 mmol/L   Chloride 103 98 - 111 mmol/L   CO2 26 22 - 32 mmol/L   Glucose, Bld 130 (H) 70 - 99 mg/dL    Comment: Glucose reference range applies only to samples taken after fasting for at least 8 hours.   BUN 27 (H) 8 - 23 mg/dL   Creatinine, Ser 9.02 0.61 - 1.24 mg/dL   Calcium  8.3 (L) 8.9 - 10.3 mg/dL   GFR, Estimated >39 >39 mL/min    Comment: (NOTE) Calculated using the CKD-EPI Creatinine Equation (2021)    Anion gap 3 (L) 5 - 15     Comment: Performed at [facility], [address], [place], [place] [address]  Glucose, capillary     Status: Abnormal   Collection Time: 08/03/24  6:16 AM  Result Value Ref Range   Glucose-Capillary 116 (H) 70 - 99 mg/dL    Comment: Glucose reference range applies only to samples taken after fasting for at least 8 hours.  Glucose, capillary     Status: Abnormal   Collection Time: 08/03/24 11:49 AM  Result Value Ref Range   Glucose-Capillary 178 (H) 70 - 99 mg/dL    Comment: Glucose reference range applies only to samples taken after fasting for at least 8 hours.  Glucose, capillary     Status: Abnormal   Collection Time: 08/03/24  4:11 PM  Result Value Ref Range   Glucose-Capillary 188 (H) 70 - 99 mg/dL    Comment: Glucose reference range applies only to samples taken after fasting for at least 8 hours.  Glucose, capillary     Status: Abnormal   Collection Time: 08/03/24  9:12 PM  Result Value Ref Range   Glucose-Capillary 166 (H) 70 - 99 mg/dL    Comment: Glucose reference range applies only to samples taken after fasting for at least 8 hours.   No results found.    Blood pressure (!) 97/57, pulse 65, temperature (!) 97.5 F (36.4 C), temperature source Oral, resp. rate 18, height 5' 9 (1.753 m), weight  77 kg, SpO2 99%.  Medical Problem List and Plan: 1. Functional deficits secondary to GBM.  Status post right craniotomy and biopsy 07/17/2024 per Dr.Janjua.  Continue Decadron  therapy follow-up outpatient medical oncology Dr. [name]  -patient may *** shower  -ELOS/Goals: *** 2.  Antithrombotics: -DVT/anticoagulation: Right gastrocnemius DVT  Pharmaceutical: Eliquis  chronic for history of atrial fibrillation  -antiplatelet therapy: N/A 3. Pain Management: Neurontin  100 mg 3 times daily, oxycodone  as needed 4. Mood/Behavior/Sleep: Melatonin 3 mg nightly.  Provide emotional support  -antipsychotic agents: Seroquel  25 mg daily as well as 12.5 mg nightly as needed  agitation 5. Neuropsych/cognition: This patient is not capable of making decisions on his own behalf. 6. Skin/Wound Care: Routine skin checks 7. Fluids/Electrolytes/Nutrition: Routine ins and outs with follow-up chemistries 8.  Seizure prophylaxis.  Keppra  1000 mg twice daily 9.  Urinary retention.  Flomax  0.4 mg daily.  Foley tube.  Plan voiding trial 10.  Hyperlipidemia.  Lipitor 11.  Hypertension/hypertrophic cardiomyopathy.  Coreg  12.5 mg twice daily.  Monitor with increased mobility 12.  OSA/tobacco use.  Check oxygen saturations every shift 13.  Atrial fibrillation.  Status post loop recorder 2017.  Followed by Dr.Croitoru.  Continue Eliquis .  Cardiac rate controlled 14.  Hyperglycemia secondary to Decadron .  Hemoglobin A1c 6.0.  Currently on Lantus  8 units nightly.  Check blood sugars 3 times daily 15.  Decreased nutritional storage.  Dysphagia #3 thin liquid diet.  Follow-up dietary services 16.  Wound of gluteal cleft.  Suspect fungal etiology. Wayne PARAS Jaleena Viviani, PA-C 08/04/2024

## 2024-08-04 NOTE — Assessment & Plan Note (Signed)
 Suspected fungal etiology. - Clotrimazole  cream twice daily - Gerhardt's butt cream TID  - Desitin 40% ointment  - Turn patient every 2 hours

## 2024-08-04 NOTE — Assessment & Plan Note (Addendum)
-   Palliative consulted, appreciate assistance in patient's care - Oncology consulted, appreciate recommendations - Repeat stat CT head if there is acute change in mental status or new neurological deficits - Keppra  1000 mg BID - Versed  prn for clinical seizures - PO Dexamethasone  2 mg Q8H (8am, 1pm, and 6pm along with other nightly medications at 6pm) - Taper per neurology: reduce to 2 mg q12h after 2 weeks (on 10/1), then after 2 weeks reduce to 2mg  once daily (on 10/15) - Glucose regimen: Lantus  8u at bedtime and SSI TID with meals while on steroids to maintain glucose 140-180 - Eliquis  5 mg BID - Cough: Guaifenesin  15 mL q6h prn, throat spray prn - Bowel Regimen: Senna 8.6 mg daily, Lactulose  10 g TID, pepto bismol 30 mL q4h PRN - Nausea regimen: Zofran  4 mg PRN - Continuous cardiac monitoring - PT/OT following - SLP following - Fall and seizure precautions - AM Labs: [address]

## 2024-08-04 NOTE — PMR Pre-admission (Signed)
 PMR Admission Coordinator Pre-Admission Assessment  Patient: Wayne Molina is an 68 y.o., male MRN: [id number] DOB: 11/18/1955 Height: 5' 9 (175.3 cm) Weight: 77 kg              Insurance Information HMO:     PPO:      PCP:      IPA:      80/20:      OTHER:  PRIMARY: [employer]      Policy#: [id number]      Subscriber: pt CM Name: Thersia Drown      Phone#: not provided     Fax#: [phone] Pre-Cert#: [id number] auth for CIR from [facility] with [facility] for admit 9/26 with next review date 08/11/2024.  Updates due to [place] at fax listed above.        Employer:  Benefits:  Phone #: [phone]     Name:  Eff. Date: 11/10/23     Deduct: $0      Out of Pocket Max: [phone] (met $345)      Life Max: n/a  CIR: $395/day for days 1-5      SNF: 20 full days  Outpatient:      Co-Pay: $45/visit Home Health: 100%      Co-Pay:  DME: 80%     Co-Pay: 20% Providers:  SECONDARY: T[employer] for Life      Policy#: [id number]      Phone#:   Artist:       Phone#:   The Data processing manager" for patients in Inpatient Rehabilitation Facilities with attached "Privacy Act Statement-Health Care Records" was provided and verbally reviewed with: Patient and Family  Emergency Contact Information Contact Information     Name Relation Home Work Mobile   [place] Spouse   [phone]   Vaughan,Rodney Brother [phone]     Kimura,Angelina Daughter [phone]  [phone]      Other Contacts   None on File    Current Medical History  Patient Admitting Diagnosis: Glioblastoma   History of Present Illness:  Wayne Molina is a 68 y/o male with PMH of AF (loop recorder 2017 per Dr.Croitoru maintained on chronic Eliquis ), atypical variant hypertrophic cardiomyopathy, depression/anxiety, HTN,  OSA/tobacco use who presented to [facility] on 07/07/2024 with acute onset of left-sided weakness/slurred speech and hemianopsia while at work.  Initial CT concerning for right MCA CVA  however patient had witnessed seizure in the ER repeat CT was concerning for mass.  EEG showed evidence of L lepto Jenise and cortical dysfunction arising from right hemisphere likely due to underlying structural abnormality.  Additionally there was severe diffuse encephalopathy.  No seizure was seen throughout the recording.  Patient was loaded with Keppra  for seizure prophylaxis and subsequent MRI showed infiltrating tumor concerning for glioblastoma versus lymphoma.  Admission chemistries unremarkable except glucose 125.  Hospital course patient did require intubation for airway protection 8/29 - 9/5 for Enterobacter in the setting of progressive encephalopathy.  Patient underwent right craniotomy for biopsy of brain tumor 07/17/2024 per Dr.Janjua and pathology consistent with glioblastoma multiforme.  Medical oncology Dr. [name] consulted and no current formal plan for treatment to start for 2-3 weeks and latest cranial CT scan 07/26/2024 showed no hydrocephalus without acute changes with similar vasogenic edema and mass effect..  Patient currently remains on Decadron  therapy as indicated.  He had been cleared to resume chronic Eliquis .  Venous Doppler studies 07/23/2024 did show a right gastrocnemius vein patient remained on chronic Eliquis  as indicated.  He remains on Keppra  for seizure prophylaxis.  Palliative care was consulted to establish goals of care.  Leukocytosis 11,400-15,000 felt to be steroid-induced.  He did experience bouts of urinary retention with Foley/voiding trial and maintained on Flomax .  Patient with decreased nutritional storage initially with nasogastric tube for nutritional support currently on a dysphagia #3, thin liquid diet. Patient has had intermittent confusion and restlessness, especially at nighttime, maintained on Seroquel .  Therapy evaluations completed and pt was recommended for a comprehensive rehab program.   Complete NIHSS TOTAL: 4 Glasgow Coma Scale Score: 15  Patient's  medical record from [facility] has been reviewed by the rehabilitation admission coordinator and physician.  Past Medical History  Past Medical History:  Diagnosis Date   Anxiety    Arthritis    fingers   Chronic atrial fibrillation (HCC)    Depression    Family history of breast cancer    Family history of cancer of male genital organ    Family history of gene mutation    BRIP1   Family history of malignant neoplasm of gastrointestinal tract    Hyperlipidemia    Hypertension    Personal history of colonic polyps 11/12/2006   Sleep apnea     Has the patient had major surgery during 100 days prior to admission? Yes  Family History  family history includes Breast cancer (age of onset: 57) in his niece; Cancer in his paternal aunt and paternal great-grandmother; Cancer (age of onset: 72) in his cousin; Cancer (age of onset: 32) in his niece; Colon cancer in his maternal uncle; Colon cancer (age of onset: 79) in his nephew; Dementia in his mother; Diabetes in his father; Heart disease in his father; Kidney disease in his brother; Lung cancer in his paternal aunt; Other in his niece and sister.   Current Medications   Current Facility-Administered Medications:    acetaminophen  (TYLENOL ) tablet 1,000 mg, 1,000 mg, Oral, QID, Gomes, Adriana, DO, 1,000 mg at 08/04/24 0908   albuterol  (PROVENTIL ) (2.5 MG/3ML) 0.083% nebulizer solution 2.5 mg, 2.5 mg, Nebulization, Q6H PRN, Pawar, Rahul, MD, 2.5 mg at 07/13/24 1416   apixaban  (ELIQUIS ) tablet 5 mg, 5 mg, Oral, BID, Nemecek, Amanda, MD, 5 mg at 08/04/24 0907   atorvastatin  (LIPITOR) tablet 40 mg, 40 mg, Oral, Daily, Delores Suzann HERO, MD, 40 mg at 08/04/24 0908   bismuth  subsalicylate (PEPTO BISMOL) 262 MG/15ML suspension 30 mL, 30 mL, Oral, Q4H PRN, Suknaim, Kulkaew B, DO, 30 mL at 08/03/24 1615   carvedilol  (COREG ) tablet 12.5 mg, 12.5 mg, Oral, BID, Tharon Lung, MD, 12.5 mg at 08/04/24 0907   Chlorhexidine  Gluconate Cloth 2 % PADS 6  each, 6 each, Topical, Daily, Delores Suzann HERO, MD, 6 each at 08/04/24 9075   clotrimazole  (LOTRIMIN ) 1 % cream, , Topical, BID, Tharon Lung, MD, Given at 08/04/24 9075   dexamethasone  (DECADRON ) tablet 2 mg, 2 mg, Oral, TID, 2 mg at 08/04/24 0859 **FOLLOWED BY** [START ON 08/10/2024] dexamethasone  (DECADRON ) tablet 2 mg, 2 mg, Oral, BID **FOLLOWED BY** [START ON 08/24/2024] dexamethasone  (DECADRON ) tablet 2 mg, 2 mg, Oral, Daily, Nemecek, Amanda, MD   feeding supplement (ENSURE PLUS HIGH PROTEIN) liquid 237 mL, 237 mL, Oral, TID PC & HS, Delores Suzann HERO, MD, 237 mL at 08/04/24 9173   gabapentin  (NEURONTIN ) capsule 100 mg, 100 mg, Oral, TID, Janna, Adriana, DO, 100 mg at 08/04/24 0909   Gerhardt's butt cream, , Topical, TID, Delores Suzann HERO, MD, Given at 08/04/24 [id number]   guaiFENesin  (  ROBITUSSIN) 100 MG/5ML liquid 15 mL, 15 mL, Oral, Q6H PRN, Nygaard, Joseph, MD   insulin  aspart (novoLOG ) injection 0-9 Units, 0-9 Units, Subcutaneous, TID WC, Hammons, Kimberly B, RPH, 1 Units at 08/04/24 9370   insulin  glargine (LANTUS ) injection 8 Units, 8 Units, Subcutaneous, QHS, Nygaard, Joseph, MD, 8 Units at 08/03/24 2117   lactulose  (CHRONULAC ) 10 GM/15ML solution 10 g, 10 g, Oral, TID, Nemecek, Amanda, MD, 10 g at 08/04/24 0909   levETIRAcetam  (KEPPRA ) tablet 1,000 mg, 1,000 mg, Oral, BID, Yadav, Priyanka O, MD, 1,000 mg at 08/04/24 0908   liver oil-zinc  oxide (DESITIN) 40 % ointment, , Topical, BID PRN, Tharon Lung, MD, Given at 07/24/24 9081   melatonin tablet 3 mg, 3 mg, Oral, QHS, Tharon Lung, MD, 3 mg at 08/03/24 2118   ondansetron  (ZOFRAN ) tablet 4 mg, 4 mg, Oral, Q4H PRN **OR** ondansetron  (ZOFRAN ) injection 4 mg, 4 mg, Intravenous, Q4H PRN, Janjua, Rashid M, MD   Oral care mouth rinse, 15 mL, Mouth Rinse, 4 times per day, Pawar, Rahul, MD, 15 mL at 08/04/24 0859   oxyCODONE  (Oxy IR/ROXICODONE ) immediate release tablet 5 mg, 5 mg, Oral, Q6H PRN, Suknaim, Kulkaew B, DO, 5 mg at 08/02/24 1428    pantoprazole  (PROTONIX ) EC tablet 40 mg, 40 mg, Oral, Daily, Delores Suzann HERO, MD, 40 mg at 08/04/24 0908   phenol (CHLORASEPTIC) mouth spray 1 spray, 1 spray, Mouth/Throat, PRN, Delores Suzann HERO, MD, 1 spray at 07/25/24 1130   QUEtiapine  (SEROQUEL ) tablet 12.5 mg, 12.5 mg, Oral, QHS PRN, Nygaard, Joseph, MD   QUEtiapine  (SEROQUEL ) tablet 25 mg, 25 mg, Oral, Daily, Mabe, Gerald, MD, 25 mg at 08/03/24 2118   senna (SENOKOT) tablet 8.6 mg, 1 tablet, Oral, Daily, Janna, Adriana, DO, 8.6 mg at 08/04/24 0909   tamsulosin  (FLOMAX ) capsule 0.4 mg, 0.4 mg, Oral, Daily, Janna, Adriana, DO, 0.4 mg at 08/04/24 0907  Patients Current Diet:  Diet Order             DIET DYS 3 Room service appropriate? 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  Independent  Indoor Mobility: Did the patient need assistance with walking from room to room (with or without device)? Independent  Stairs: Did the patient need assistance with internal or external stairs (with or without device)? Independent  Functional Cognition: Did the patient need help planning regular tasks such as shopping or remembering to take medications?  Independent  Patient Information Are you of Hispanic, Latino/a,or Spanish origin?: A. No, not of Hispanic, Latino/a, or Spanish origin What is your race?: A. White Do you need or want an interpreter to communicate with a doctor or health care staff?: 0. No  Patient's Response To:  Health Literacy and Transportation Is the patient able to respond to health literacy and transportation needs?: Yes Health Literacy - How often do you need to have someone help you when you read instructions, pamphlets, or other written material from your doctor or pharmacy?: Never In the past 12 months, has lack of transportation kept you from medical appointments or from getting medications?: No In the past 12 months, has lack of transportation kept you from meetings, work, or from getting things needed for daily living?: No  Home Assistive Devices / Equipment Home Equipment: None  Prior Device Use: Indicate devices/aids used by the patient prior to current illness, exacerbation or injury? None of the above  Current Functional Level Cognition  Arousal/Alertness: Lethargic Overall Cognitive Status: Impaired/Different from baseline Orientation Level: Oriented X4 Attention: Sustained Sustained Attention: Impaired Sustained Attention Impairment: Verbal basic, Functional basic Memory: Impaired Memory Impairment: Decreased recall of new information Awareness: Impaired Awareness Impairment: Intellectual impairment Problem Solving: Impaired Problem Solving Impairment: Verbal basic, Functional basic Executive Function: Initiating Initiating: Impaired Initiating Impairment: Verbal basic, Functional basic Safety/Judgment: Impaired    Extremity Assessment (includes Sensation/Coordination)  Upper Extremity Assessment: Left hand dominant, LUE deficits/detail LUE Deficits / Details: LUE grossly 3+/5 to MMT LUE Sensation: decreased light touch LUE Coordination: decreased fine motor, decreased gross motor   Lower Extremity Assessment: Generalized weakness RLE Deficits / Details: difficult to fully assess due to lethargy and cognitive and communication deficits but pt denies numbness/tingling bil with light touch but not very specific when asked to state where he was feeling the light touch bil (unsure if due to cog or communication though), able to extend knees against gravity but difficulty flexing hips against gravity with gross MMT scores of 2+ to 4- bil, questionable apraxia with movement as pt would sometimes do the opposite of what was cued LLE Deficits / Details: difficult to fully assess due to lethargy and cognitive and communication deficits but pt denies numbness/tingling bil with light touch but not very specific when asked to state where he was feeling the light touch bil (unsure if due to cog or communication though), able to extend knees against gravity but difficulty flexing hips against gravity with gross MMT scores of 2+ to 4- bil, questionable apraxia with movement as pt would sometimes do the opposite of what was cued    ADLs  Overall ADL's : Needs assistance/impaired Eating/Feeding: Set up, Bed level Eating/Feeding Details (indicate cue type and reason): sipping from straw in cup without spillage; provided meal and utensil setup on lunch tray Grooming: Wash/dry hands, Wash/dry face, Oral care, Minimal assistance, Cueing for sequencing, Standing Grooming Details (indicate cue type and reason): cued to terminate oral care task and for completed sequencing, assist for stability Upper Body Bathing: Moderate assistance, Sitting Lower Body Bathing: Maximal assistance Upper Body Dressing : Minimal assistance, Cueing for sequencing, Bed level (HOB elevated) Upper Body Dressing Details (indicate cue type and reason): donned clean gown, cues for sequencing and assist to thoroughly thread LUE Lower Body Dressing: Contact guard assist, Sitting/lateral leans Lower Body Dressing Details  (indicate cue type and reason): donned B socks via hip hike technique, inc time with LLE sock Toilet Transfer: Moderate assistance, +2 for physical assistance Toilet Transfer Details (indicate cue type and reason): stand step using RW to recliner Toileting- Clothing Manipulation and Hygiene: Total assistance Functional mobility during ADLs: Moderate assistance, +2 for physical assistance    Mobility  Overal bed mobility: Needs Assistance Bed Mobility: Supine to Sit Rolling: Min assist  Sidelying to sit: HOB elevated, Used rails, Min assist Supine to sit: Supervision, HOB elevated, Used rails Sit to supine: Min assist, HOB elevated, Used rails Sit to sidelying: Mod assist, +2 for physical assistance, +2 for safety/equipment General bed mobility comments: increased time    Transfers  Overall transfer level: Needs assistance Equipment used: Rolling walker (2 wheels) Transfers: Sit to/from Stand Sit to Stand: Min assist Bed to/from chair/wheelchair/BSC transfer type:: Step pivot Squat pivot transfers: Total assist, +2 safety/equipment Step pivot transfers: Min assist Transfer via Lift Equipment: Stedy General transfer comment: From EOB with min A for anterior weight shift and power up due to posterior bias    Ambulation / Gait / Stairs / Wheelchair Mobility  Ambulation/Gait Ambulation/Gait assistance: Editor, commissioning (Feet): 10 Feet Assistive device: Rolling walker (2 wheels) Gait Pattern/deviations: Step-to pattern, Trunk flexed, Decreased stride length, Wide base of support General Gait Details: Pt demonstrates short steps with stiff BLE and wide BOS behind RW. Cues for RW proximity, increased step length, and upright posture. Noted BUE stiffness with attempts to bring RW closer. Gait velocity: decr Pre-gait activities: static standing marches    Posture / Balance Dynamic Sitting Balance Sitting balance - Comments: seated EOB without back support Balance Overall balance  assessment: Needs assistance Sitting-balance support: No upper extremity supported, Feet supported Sitting balance-Leahy Scale: Good Sitting balance - Comments: seated EOB without back support Postural control: Posterior lean (initially retropulsive) Standing balance support: Bilateral upper extremity supported, Reliant on assistive device for balance, During functional activity Standing balance-Leahy Scale: Poor Standing balance comment: reliant on UE and external support    Special considerations/ Life events Special service needs oncology, palliative following     Previous Home Environment (from acute therapy documentation) Living Arrangements: Spouse/significant other Available Help at Discharge: Family, Available 24 hours/day Type of Home: House Home Layout: One level Home Access: Level entry Bathroom Shower/Tub: Engineer, manufacturing systems: Standard Home Care Services: No  Discharge Living Setting Plans for Discharge Living Setting: Patient's home, Lives with (comment) (spouse) Type of Home at Discharge: House Discharge Home Layout: One level Discharge Home Access: Level entry Discharge Bathroom Shower/Tub: Tub/shower unit Discharge Bathroom Toilet: Standard Discharge Bathroom Accessibility: Yes How Accessible: Accessible via walker Does the patient have any problems obtaining your medications?: No  Social/Family/Support Systems Patient Roles: Spouse Anticipated Caregiver: spouse, [name], and brother, Adriana Anticipated Caregiver's Contact Information: [name]  [phone]; Adriana [phone] Ability/Limitations of Caregiver: min assist for w/c level, supervision for ambulatory level Caregiver Availability: 24/7 Discharge Plan Discussed with Primary Caregiver: Yes Is Caregiver In Agreement with Plan?: Yes   Goals Patient/Family Goal for Rehab: PT/OT supervision to min assist; SLP supervision Expected length of stay: 5-7 days Additional Information: Discharge  plan: home with spouse and brother for 24/7 support.  Will need short rehab stay to maximize functional status for treatment options with oncology Pt/Family Agrees to Admission and willing to participate: Yes Program Orientation Provided & Reviewed with Pt/Caregiver Including Roles  & Responsibilities: Yes   Decrease burden of Care through IP rehab admission: n/a   Possible need for SNF placement upon discharge: Not anticipated.  Plan for short rehab stay to maximize functional status with d/c home with family.    Patient Condition: This patient's condition remains as documented in the consult dated 08/03/24, in which the Rehabilitation Physician determined and documented that the patient's condition is appropriate for intensive rehabilitative care in an inpatient rehabilitation facility. Will admit to inpatient rehab today.  Preadmission Screen  Completed By:  Caitlin E Warren, PT, DPT 08/04/2024 10:59 AM ______________________________________________________________________   Discussed status with Dr. Emeline on 08/04/24 at  11:07 AM  and received approval for admission today.  Admission Coordinator:  Caitlin E Warren, time 11:07 AM /Date09/26/25

## 2024-08-04 NOTE — Progress Notes (Signed)
 Occupational Therapy Treatment Patient Details Name: Wayne Molina MRN: [id number] DOB: Jul 24, 1956 Today's Date: 08/04/2024   History of present illness Pt is a 68 y/o male presenting on 8/28 with L arm weakness, L hemianopsia, and slurred speech. 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 Course complicated by seizure episode. Intubated 8/29, extubated 9/4 but still with difficulty managing secretions.  Biopsy on 9/8. +DVT R LE 9/14.  PMH includes: anxiety, HLD, HTN, sleep apnea, chronic afib.   OT comments  Pt greeted in chair, agreeable for OT visit. Pt reported feeling lethargic today despite decent sleep last night; still participatory. Supportive spouse at bedside. Pt able to complete standing grooming and simple UB bathing with CGA, occasional cues for sequencing. He required up to min A for mobility via RW to appropriately navigate obstacles and maintain appropriate proximity within RW. Still benefits from cues to attend to L side. OT to continue to follow.      If plan is discharge home, recommend the following:  A lot of help with walking and/or transfers;A little help with bathing/dressing/bathroom;Assistance with cooking/housework;Direct supervision/assist for medications management;Direct supervision/assist for financial management;Assist for transportation;Help with stairs or ramp for entrance;Supervision due to cognitive status   Equipment Recommendations       Recommendations for Other Services      Precautions / Restrictions Precautions Precautions: Fall Recall of Precautions/Restrictions: Impaired Precaution/Restrictions Comments: delirium prevention; SBP <160 Restrictions Weight Bearing Restrictions Per Provider Order: No       Mobility Bed Mobility               General bed mobility comments: not assessed - pt OOB throughout session    Transfers Overall transfer level:  Needs assistance Equipment used: Rolling walker (2 wheels) Transfers: Sit to/from Stand Sit to Stand: Min assist           General transfer comment: Cued for hand placement and powering up, cued to properly place feet within frame of RW.     Balance Overall balance assessment: Needs assistance Sitting-balance support: No upper extremity supported, Feet supported Sitting balance-Leahy Scale: Good     Standing balance support: Bilateral upper extremity supported, Reliant on assistive device for balance, During functional activity Standing balance-Leahy Scale: Poor                             ADL either performed or assessed with clinical judgement   ADL       Grooming: Wash/dry face;Wash/dry hands;Contact guard assist;Standing Grooming Details (indicate cue type and reason): assist for stability and cueing to maintain upright                                    Extremity/Trunk Assessment Upper Extremity Assessment LUE Deficits / Details: functional use of LUE improving today, able to use for bilateral integrational tasks            Vision   Additional Comments: benefits from cues to attend to L side   Perception     Praxis     Communication Communication Communication: Impaired Factors Affecting Communication: Reduced clarity of speech   Cognition Arousal: Lethargic Behavior During Therapy: Flat affect Cognition: Cognition impaired     Awareness: Intellectual awareness impaired Memory impairment (select all impairments): Short-term memory, Working memory Attention  impairment (select first level of impairment): Selective attention Executive functioning impairment (select all impairments): Organization, Sequencing, Reasoning, Problem solving OT - Cognition Comments: reported feeling more tired today despite decent sleep last night; still motivated & participatory with OT                 Following commands: Impaired Following  commands impaired: Follows multi-step commands inconsistently, Follows one step commands with increased time      Cueing   Cueing Techniques: Verbal cues, Tactile cues, Visual cues  Exercises      Shoulder Instructions       General Comments supportive wife present at bedside    Pertinent Vitals/ Pain       Pain Assessment Pain Assessment: No/denies pain Pain Score: 0-No pain  Home Living                                          Prior Functioning/Environment              Frequency  Min 2X/week        Progress Toward Goals  OT Goals(current goals can now be found in the care plan section)  Progress towards OT goals: Progressing toward goals     Plan      Co-evaluation                 AM-PAC OT 6 Clicks Daily Activity     Outcome Measure   Help from another person eating meals?: A Little Help from another person taking care of personal grooming?: A Little Help from another person toileting, which includes using toliet, bedpan, or urinal?: A Lot Help from another person bathing (including washing, rinsing, drying)?: A Lot Help from another person to put on and taking off regular upper body clothing?: A Little Help from another person to put on and taking off regular lower body clothing?: A Lot 6 Click Score: 15    End of Session Equipment Utilized During Treatment: Gait belt;Rolling walker (2 wheels)  OT Visit Diagnosis: Unsteadiness on feet (R26.81);Other abnormalities of gait and mobility (R26.89);Muscle weakness (generalized) (M62.81)   Activity Tolerance Patient tolerated treatment well   Patient Left in chair;with call bell/phone within reach;with family/visitor present   Nurse Communication Mobility status        Time: 8951-8894 OT Time Calculation (min): 17 min  Charges: OT General Charges $OT Visit: 1 Visit OT Treatments $Self Care/Home Management : 8-22 mins  Decker Cogdell D., MSOT, OTR/L Acute Rehabilitation  Services [phone] Secure Chat Preferred  Wayne Molina 08/04/2024, 1:13 PM

## 2024-08-04 NOTE — Assessment & Plan Note (Signed)
 Urinary retention - Foley in place (failed void trail); on Flomax  0.4mg  HTN -controlled, continue home Coreg  12.5 mg BID. Holding home Losartan . Discontinued hydralazine  9/12. Systolic goal <160.  HLD - Continue home Atorvastatin  40mg  daily

## 2024-08-04 NOTE — Discharge Summary (Signed)
 Physician Discharge Summary  Patient ID: Wayne Molina MRN: [id number] DOB/AGE: [birth year] 70 y.o.  Admit date: 08/04/2024 Discharge date: 08/09/2024  Discharge Diagnoses:  Principal Problem:   GBM (glioblastoma multiforme) (HCC) Right gastrocnemius DVT Seizure prophylaxis Urinary retention/neurogenic bladder Hyperlipidemia Hypertension/hypertrophic cardiomyopathy OSA/tobacco use Atrial fibrillation Hyperglycemia secondary to Decadron  Decreased nutritional storage Wound of gluteal cleft Constipation Mood stabilization  Discharged Condition: Stable  Significant Diagnostic Studies: DG Swallowing Func-Speech Pathology Result Date: 07/28/2024 Table formatting from the original result was not included. Modified Barium Swallow Study Patient Details Name: Wayne Molina MRN: [id number] Date of Birth: 1956/06/12 Today's Date: 07/28/2024 HPI/PMH: HPI: Wayne Molina is a 68 y.o. man who presented to [facility] 8/28 with AMS. MRI large infiltrative tumor with heterogeneous enhancement tracking from the splenium of corpus callosum near midline into right temporal lobe. Course complicated by seizure; intubated 8/29-9/5.  Underwent craniotomy and biopsy 9/8 with result of glioblastoma multiforme stage IV. Has cortrak; on dysphagia 3/thin liquids. PMHx significant for HTN, HLD, Afib on Eliquis , untreated OSA. Clinical Impression: Pt presented with a moderate oropharyngeal dysphagia with a total DIGEST score of  2. Safety grade: 2 (intermittent, not gross aspiration) and efficiency grade: 1 (less than half residue).  There was decreased oral propulsion and reduced tongue base contraction against pharyngeal wall, leading to residue in the valleculae as well as inconsistent epiglottic inversion over the larynx.  Larger, weightier volumes helped with epiglottic closure.  Aspiration of thin and nectars occurred as trace amounts of material spilled through gap between epiglottis and arytenoids, settling on the on vocal  folds and ultimately passing into the trachea.  Pt had poor sensation and did not spontaneously cough in response to aspiration.  As study progressed and pt had more opportunities to swallow, airway protection improved and he had fewer incidents of aspiration by the end of the study.   Spoke with pt and his wife after the MBS - he has been on oral diet of dysphagia 3/thin liquids for ten days without adverse consequences, no pna.  Aspiration is inconsistent and swallow function actually improved as he swallowed more.  Given these factors, we decided pt should remain on PO diet.  He should continue to sit upright for PO intake and intentionally cough intermittently while eating meals.  SLP will follow closely.  D/W RN. Factors that may increase risk of adverse event in presence of aspiration Noe & Lianne 2021): Factors that may increase risk of adverse event in presence of aspiration Noe & Lianne 2021): Limited mobility Recommendations/Plan: Swallowing Evaluation Recommendations Swallowing Evaluation Recommendations Recommendations: PO diet PO Diet Recommendation: Dysphagia 3 (Mechanical soft); Thin liquids (Level 0) Liquid Administration via: Cup; Straw Medication Administration: Via alternative means Supervision: Full assist for feeding Swallowing strategies  : Clear throat intermittently; Hard cough after swallowing Postural changes: Position pt fully upright for meals Oral care recommendations: Oral care BID (2x/day) Treatment Plan Treatment Plan Treatment recommendations: Therapy as outlined in treatment plan below Follow-up recommendations: Acute inpatient rehab (3 hours/day) Functional status assessment: Patient has had a recent decline in their functional status and demonstrates the ability to make significant improvements in function in a reasonable and predictable amount of time. Interventions: Aspiration precaution training; Patient/family education; Respiratory muscle strength training  Recommendations Recommendations for follow up therapy are one component of a multi-disciplinary discharge planning process, led by the attending physician.  Recommendations may be updated based on patient status, additional functional criteria and insurance authorization. Assessment: Orofacial Exam: Orofacial Exam Oral Cavity -  Dentition: Dentures, top Anatomy: Anatomy: WFL Boluses Administered: Boluses Administered Boluses Administered: Thin liquids (Level 0); Mildly thick liquids (Level 2, nectar thick); Moderately thick liquids (Level 3, honey thick); Puree; Solid  Oral Impairment Domain: Oral Impairment Domain Lip Closure: No labial escape Tongue control during bolus hold: Not tested Bolus preparation/mastication: Slow prolonged chewing/mashing with complete recollection Bolus transport/lingual motion: Slow tongue motion Oral residue: Trace residue lining oral structures Location of oral residue : Floor of mouth; Tongue; Lateral sulci Initiation of pharyngeal swallow : Valleculae  Pharyngeal Impairment Domain: Pharyngeal Impairment Domain Soft palate elevation: No bolus between soft palate (SP)/pharyngeal wall (PW) Laryngeal elevation: Complete superior movement of thyroid  cartilage with complete approximation of arytenoids to epiglottic petiole Anterior hyoid excursion: Complete anterior movement Epiglottic movement: Partial inversion Laryngeal vestibule closure: Incomplete, narrow column air/contrast in laryngeal vestibule Pharyngeal stripping wave : Present - diminished Pharyngeal contraction (A/P view only): N/A Pharyngoesophageal segment opening: Complete distension and complete duration, no obstruction of flow Tongue base retraction: Trace column of contrast or air between tongue base and PPW; Narrow column of contrast or air between tongue base and PPW Pharyngeal residue: Collection of residue within or on pharyngeal structures Location of pharyngeal residue: Valleculae  Esophageal Impairment Domain:  Esophageal Impairment Domain Esophageal clearance upright position: Complete clearance, esophageal coating Pill: Pill Consistency administered: -- (na) Penetration/Aspiration Scale Score: Penetration/Aspiration Scale Score 1.  Material does not enter airway: Puree; Solid; Moderately thick liquids (Level 3, honey thick) 8.  Material enters airway, passes BELOW cords without attempt by patient to eject out (silent aspiration) : Thin liquids (Level 0); Mildly thick liquids (Level 2, nectar thick) Compensatory Strategies: Compensatory Strategies Compensatory strategies: Yes Straw: -- (no difference) Chin tuck: Ineffective Ineffective Chin Tuck: Mildly thick liquid (Level 2, nectar thick) Left head turn: -- (negligible difference)   General Information: Caregiver present: No  Diet Prior to this Study: Dysphagia 3 (mechanical soft); Thin liquids (Level 0)   Temperature : Normal   Respiratory Status: WFL   Supplemental O2: None (Room air)   History of Recent Intubation: No  Behavior/Cognition: Alert; Cooperative No data recorded Baseline vocal quality/speech: Dysphonic Volitional Cough: Able to elicit Volitional Swallow: Able to elicit No data recorded Goal Planning: Prognosis for improved oropharyngeal function: Good No data recorded No data recorded Patient/Family Stated Goal: to get pt to eat more, pt with ongoing odynophagia No data recorded Pain: Pain Assessment Pain Assessment: No/denies pain Faces Pain Scale: 0 Pain Location: throat pain - odynophagia Pain Intervention(s): Monitored during session End of Session: Start Time:SLP Start Time (ACUTE ONLY): 1335 Stop Time: SLP Stop Time (ACUTE ONLY): 1410 Time Calculation:SLP Time Calculation (min) (ACUTE ONLY): 35 min Charges: SLP Evaluations $ SLP Speech Visit: 1 Visit SLP Evaluations $MBS Swallow: 1 Procedure $Swallowing Treatment: 1 Procedure $Speech Treatment for Individual: 1 Procedure SLP visit diagnosis: SLP Visit Diagnosis: Dysphagia, oropharyngeal phase  (R13.12) Past Medical History: Past Medical History: Diagnosis Date  Anxiety   Arthritis   fingers  Chronic atrial fibrillation (HCC)   Depression   Family history of breast cancer   Family history of cancer of male genital organ   Family history of gene mutation   BRIP1  Family history of malignant neoplasm of gastrointestinal tract   Hyperlipidemia   Hypertension   Personal history of colonic polyps 11/12/2006  Sleep apnea  Past Surgical History: Past Surgical History: Procedure Laterality Date  2-D echocardiogram  07/22/2010  Ejection fraction greater than 55%. Left atrium moderately dilated. Right atrium moderately  dilated. 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 Mild to Moderate MR. Mild to moderate TR.  APPLICATION OF CRANIAL NAVIGATION Right 07/17/2024  Procedure: COMPUTER-ASSISTED NAVIGATION, FOR CRANIAL PROCEDURE;  Surgeon: Rosslyn Dino HERO, MD;  Location: [facility] OR;  Service: Neurosurgery;  Laterality: Right;  CRANIAL NAVIGATION  ATRIAL ABLATION SURGERY  2004  EP IMPLANTABLE DEVICE N/A 02/11/2016  Procedure: Loop Recorder Insertion;  Surgeon: Jerel Balding, MD;  Location: [facility] INVASIVE CV LAB;  Service: Cardiovascular;  Laterality: N/A;  Exercise Myoview stress test  07/08/2000  Nonischemic low-risk.  HIP FRACTURE SURGERY  1970s  IR GENERIC HISTORICAL  01/21/2017  IR RADIOLOGIST EVAL & MGMT 01/21/2017 [facility]-INTERV RAD  STERIOTACTIC STIMULATOR INSERTION Right 07/17/2024  Procedure: OPEN CRANIOTOMY FOR BIOPSY;  Surgeon: Rosslyn Dino HERO, MD;  Location: [facility] OR;  Service: Neurosurgery;  Laterality: Right;  RIGHT STEREOTACTIC BRAIN BIOPSY Vona Alan Bradford 07/28/2024, 3:37 PM Amanda L. Vona, MA CCC/SLP Clinical Specialist - Acute Care SLP Acute Rehabilitation Services Office number [phone]  CT HEAD WO CONTRAST ( ) Result Date: 07/26/2024 EXAM: CT HEAD WITHOUT CONTRAST 07/26/2024 01:39:00 PM TECHNIQUE: CT of the head was performed without the administration of intravenous contrast. Automated exposure control, iterative  reconstruction, and/or weight based adjustment of the mA/kV was utilized to reduce the radiation dose to as low as reasonably achievable. COMPARISON: CT head 07/23/2024. CLINICAL HISTORY: Follow up imaging post brain biopsy tract hemorrhage. FINDINGS: BRAIN AND VENTRICLES: Similar appearance of right parietal craniotomy for biopsy of mass lesion. There are decreased hyperdense components of the mass compared to prior, likely reflecting decreased postbiopsy blood products. Similar vasogenic edema in the right parietal lobe. Additional vasogenic edema in the posterior right temporal lobe also similar to prior. 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 Similar mass effect on the right lateral ventricle. No acute hemorrhage. No evidence of acute infarct. No hydrocephalus. No extra-axial collection. No midline shift. ORBITS: No acute abnormality. SINUSES: No acute abnormality. SOFT TISSUES AND SKULL: No acute soft tissue abnormality. No skull fracture. IMPRESSION: 1. Similar appearance of right parietal craniotomy for biopsy of mass lesion. There are decreased hyperdense components within the mass compatible with decreased post-biopsy blood products. 2. Similar vasogenic edema and mass effect. Electronically signed by: Donnice Mania MD 07/26/2024 04:12 PM EDT RP Workstation: [id number]   CUP PACEART REMOTE DEVICE CHECK Result Date: 07/25/2024 ILR summary report received. Battery status OK. Normal device function. No new symptom, tachy, brady, or pause episodes. No new AF episodes. Presenting EGM with irregular R-R intervals and indiscernible P waves c/w hx of permanent  atrial arrhythmia, V rates are controlled, on Eliquis  per Epic. Monthly summary reports and ROV/PRN. [facility], [facility]  VAS US  LOWER EXTREMITY VENOUS (DVT) Result Date: 07/24/2024  Lower Venous DVT Study Patient Name:  Wayne Molina  Date of Exam:   07/23/2024 Medical Rec #: [id number]      Accession #:    [id number] Date of Birth: 11-08-1956       Patient  Gender: M Patient Age:   81 years Exam Location:  [facility] Procedure:      VAS US  LOWER EXTREMITY VENOUS (DVT) Referring Phys: CARINA BROWN --------------------------------------------------------------------------------  Indications: Pain.  Risk Factors: Recent cancer diagnosis (GDM). Comparison Study: Previous exam (LLEV) on 08/15/2020 was negative for DVT Performing Technologist: Ezzie Potters RVT, RDMS  Examination Guidelines: A complete evaluation includes B-mode imaging, spectral Doppler, color Doppler, and power Doppler as needed of all accessible portions of each vessel. Bilateral testing is considered an integral part of a complete  examination. Limited examinations for reoccurring indications may be performed as noted. The reflux portion of the exam is performed with the patient in reverse Trendelenburg.  +---------+---------------+---------+-----------+----------+--------------+ RIGHT    CompressibilityPhasicitySpontaneityPropertiesThrombus Aging +---------+---------------+---------+-----------+----------+--------------+ CFV      Full           Yes      Yes                                 +---------+---------------+---------+-----------+----------+--------------+ SFJ      Full                                                        +---------+---------------+---------+-----------+----------+--------------+ FV Prox  Full           Yes      Yes                                 +---------+---------------+---------+-----------+----------+--------------+ FV Mid   Full           Yes      Yes                                 +---------+---------------+---------+-----------+----------+--------------+ FV DistalFull           Yes      Yes                                 +---------+---------------+---------+-----------+----------+--------------+ PFV      Full                                                         +---------+---------------+---------+-----------+----------+--------------+ POP      Full           Yes      Yes                                 +---------+---------------+---------+-----------+----------+--------------+ PTV      Full                                                        +---------+---------------+---------+-----------+----------+--------------+ PERO     Full                                                        +---------+---------------+---------+-----------+----------+--------------+ Gastroc  None           No       No  Acute          +---------+---------------+---------+-----------+----------+--------------+   +---------+---------------+---------+-----------+----------+--------------+ LEFT     CompressibilityPhasicitySpontaneityPropertiesThrombus Aging +---------+---------------+---------+-----------+----------+--------------+ CFV      Full           Yes      Yes                                 +---------+---------------+---------+-----------+----------+--------------+ SFJ      Full                                                        +---------+---------------+---------+-----------+----------+--------------+ FV Prox  Full           Yes      Yes                                 +---------+---------------+---------+-----------+----------+--------------+ FV Mid   Full           Yes      Yes                                 +---------+---------------+---------+-----------+----------+--------------+ FV DistalFull           Yes      Yes                                 +---------+---------------+---------+-----------+----------+--------------+ PFV      Full                                                        +---------+---------------+---------+-----------+----------+--------------+ POP      Full           Yes      Yes                                  +---------+---------------+---------+-----------+----------+--------------+ PTV      Full                                                        +---------+---------------+---------+-----------+----------+--------------+ PERO     Full                                                        +---------+---------------+---------+-----------+----------+--------------+ Gastroc  Full                                                        +---------+---------------+---------+-----------+----------+--------------+  Summary: BILATERAL: - No evidence of deep vein thrombosis seen in the lower extremities, bilaterally. -No evidence of popliteal cyst, bilaterally. RIGHT: Findings consistent with acute intramuscular thrombosis involving the right gastrocnemius veins.   *See table(s) above for measurements and observations. Electronically signed by Debby Robertson on 07/24/2024 at 11:33:00 AM.    Final    CT HEAD WO CONTRAST ( ) Result Date: 07/23/2024 EXAM: CT HEAD WITHOUT CONTRAST 07/23/2024 03:38:00 PM TECHNIQUE: CT of the head was performed without the administration of intravenous contrast. Automated exposure control, iterative reconstruction, and/or weight based adjustment of the mA/kV was utilized to reduce the radiation dose to as low as reasonably achievable. COMPARISON: CT angio of head and neck 10/04/2023 CLINICAL HISTORY: Brain/CNS neoplasm, monitor. Non con. Principal Problem: GBM (glioblastoma multiforme) (HCC). Chief complaints: Weakness, Near Syncope, Code Stroke. Status post biopsy. FINDINGS: BRAIN AND VENTRICLES: Right parietal craniotomy for biopsy of the mass is again noted. The operative bed is unchanged. More hyperdense component extending into the right posterior aspect of the corpus callosum with stable. This creates mass effect on the right lateral ventricle. Vasogenic edema in the high right parietal lobe is stable. ORBITS: No acute abnormality. SINUSES: No acute abnormality. SOFT  TISSUES AND SKULL: No acute soft tissue abnormality. No skull fracture. IMPRESSION: 1. Stable right parietal operative bed and mass with more hyperdense component extending into the right posterior aspect of the corpus callosum, creating mass effect on the right lateral ventricle. 2. Stable vasogenic edema in the high right parietal lobe. Electronically signed by: Lonni Necessary MD 07/23/2024 03:49 PM EDT RP Workstation: [id number]   US  Abdomen Limited RUQ (LIVER/GB) Result Date: 07/23/2024 CLINICAL DATA:  Right upper quadrant abdominal pain EXAM: ULTRASOUND ABDOMEN LIMITED RIGHT UPPER QUADRANT COMPARISON:  None Available. FINDINGS: Gallbladder: Borderline gallbladder wall thickening may be due to nondistention. No sonographic Murphy sign noted by sonographer. Common bile duct: Diameter: 0.4 cm Liver: No focal lesion identified. Nodular liver contour raises suspicion for cirrhosis. Portal vein is patent on color Doppler imaging with normal direction of blood flow towards the liver. Other: None. IMPRESSION: 1. Nodular liver contour raises suspicion for cirrhosis. 2. Borderline gallbladder wall thickening may be due to nondistention. No sonographic Murphy sign noted by sonographer. Electronically Signed   By: Ryan Salvage M.D.   On: 07/23/2024 15:48   Overnight EEG with video Result Date: 07/22/2024 Shelton Arlin KIDD, MD     07/23/2024  7:03 AM Patient Name: Wayne Molina MRN: [id number] Epilepsy Attending: Arlin KIDD Shelton Referring Physician/Provider: Khaliqdina, Salman, MD Duration: 07/21/2024 1227 to 07/22/2024 1227  Patient history: 68 y.o. 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 EEG to evaluate for seizure  Level of alertness: awake/ lethargic , asleep  AEDs during EEG study: LEV  Technical aspects: This EEG study was done with scalp electrodes positioned according to the 10-20 International system of electrode  placement. Electrical activity was reviewed with band pass filter of 1-70Hz , sensitivity of 7 uV/mm, display speed of 94mm/sec with a 60Hz  notched filter applied as appropriate. EEG data were recorded continuously and digitally stored.  Video monitoring was available and reviewed as appropriate.  Description: The posterior dominant rhythm consists of 7.5 Hz activity of moderate voltage (25-35 uV) seen predominantly in posterior head regions, asymmetric ( right<left) and reactive to eye opening and eye closing. Sleep was characterized by vertex waves, sleep spindles (12 to 14 Hz), maximal  frontocentral region. EEG showed continuous 3 to 6 Hz theta-delta slowing right hemisphere, maximal right temporal region. Intermittent generalized 3-5hz  theta-delta slowing was also noted. Hyperventilation and photic stimulation were not performed.    ABNORMALITY - Continuous slow, right hemisphere - Intermittent slow, generalized  IMPRESSION: This study is suggestive of cortical dysfunction arising from right hemisphere likely due to underlying structural abnormality. Additionally there is mild to moderate diffuse encephalopathy. No definite seizures were seen throughout the recording.   Arlin MALVA Krebs   DG CHEST PORT 1 VIEW Result Date: 07/21/2024 CLINICAL DATA:  68 year old male with cough.  Grade 4 Glioma. EXAM: PORTABLE CHEST 1 VIEW COMPARISON:  Portable chest 07/17/2024 and earlier. FINDINGS: Portable AP semi upright view at 0656 hours. Stable cardiomegaly and mediastinal contours. Stable left chest superficial cardiac device. Enteric feeding tube courses to the abdomen, tip not included. Lung volumes are within normal limits. Allowing for portable technique the lungs are clear. Visualized tracheal air column is within normal limits. Stable visualized osseous structures. IMPRESSION: No acute cardiopulmonary abnormality. Electronically Signed   By: VEAR Hurst M.D.   On: 07/21/2024 10:48   CT ANGIO HEAD NECK W WO CM Result  Date: 07/21/2024 CLINICAL DATA:  Initial evaluation for acute neuro deficit, stroke. EXAM: CT ANGIOGRAPHY HEAD AND NECK TECHNIQUE: Multidetector CT imaging of the head and neck was performed using the standard protocol during bolus administration of intravenous contrast. Multiplanar CT image reconstructions and MIPs were obtained to evaluate the vascular anatomy. Carotid stenosis measurements (when applicable) are obtained utilizing NASCET criteria, using the distal internal carotid diameter as the denominator. RADIATION DOSE REDUCTION: This exam was performed according to the departmental dose-optimization program which includes automated exposure control, adjustment of the mA and/or kV according to patient size and/or use of iterative reconstruction technique. CONTRAST:  75mL OMNIPAQUE  IOHEXOL  350 MG/ML SOLN COMPARISON:  Prior CT from earlier the same day. FINDINGS: CT HEAD FINDINGS Brain: Examination degraded by motion artifact. Patient's right cerebral mass with superimposed hemorrhage again noted, not significantly changed. No new finding from CT performed earlier the same day. Vascular: No visible hyperdense vessel. Skull: No visible new finding. Sinuses/Orbits: No visible new finding. Other: None. Review of the MIP images confirms the above findings CTA NECK FINDINGS Aortic arch: Aortic arch and origin of the great vessels not visualized on this exam. Atheromatous change about the visualized proximal great vessels noted. No visible stenosis. Right carotid system: Right common and internal carotid arteries are patent without stenosis or dissection. Left carotid system: Left common and internal carotid arteries are patent without dissection. Eccentric calcified plaque at the left carotid bulb with estimated 50% stenosis, stable. 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 Distal reconstitution at the V3 segment via muscular collaterals,  stable. Vertebral arteries remain patent as they course into the cranial vault. No visible stenosis or dissection. Skeleton: No worrisome osseous lesions.  Patient is edentulous. Other neck: No other acute finding. Upper chest: No other acute finding. Review of the MIP images confirms the above findings CTA HEAD FINDINGS Anterior circulation: Both internal carotid arteries are patent to the termini without stenosis. A1 segments patent bilaterally. Right A1 hypoplastic. Normal anterior communicating artery complex. Anterior cerebral arteries patent without stenosis. Atheromatous plaque at the right ICA terminus/proximal right M1 segment with short-segment mild-to-moderate stenosis (series 10, image 111). Left M1 segment widely patent. No proximal MCA branch occlusion. Distal MCA branches perfused and symmetric. Posterior circulation: Right  V4 segment patent without stenosis. Right PICA not seen. Left vertebral artery patent to the takeoff of the left PICA, with subsequent reocclusion. Left PICA patent. Basilar diminutive but patent without stenosis. Superior cerebellar and posterior cerebral arteries remain patent bilaterally. Venous sinuses: Grossly patent allowing for timing the contrast bolus. Anatomic variants: As above.  No aneurysm. Review of the MIP images confirms the above findings IMPRESSION: 1. Stable CTA of the head and neck. No large vessel occlusion or other emergent finding. 2. 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 Left V4 segment reoccludes beyond the takeoff of the left PICA. 3. Atheromatous plaque at the right ICA terminus/proximal right M1 segment with short-segment mild-to-moderate stenosis. 4. Atheromatous plaque at the left carotid bulb with estimated 50% stenosis, stable. 5. Right cerebral mass with superimposed hemorrhage, not significantly changed from CT performed earlier the same day.  Electronically Signed   By: Morene Hoard M.D.   On: 07/21/2024 00:03   CT Head Wo Contrast Result Date: 07/20/2024 CLINICAL DATA:  Initial evaluation for acute neuro deficit, stroke suspected. EXAM: CT HEAD WITHOUT CONTRAST TECHNIQUE: Contiguous axial images were obtained from the base of the skull through the vertex without intravenous contrast. RADIATION DOSE REDUCTION: This exam was performed according to the departmental dose-optimization program which includes automated exposure control, adjustment of the mA and/or kV according to patient size and/or use of iterative reconstruction technique. COMPARISON:  Prior CT from 07/18/2024. FINDINGS: Brain: Postoperative changes from prior right temporal craniotomy for tumor biopsy again noted. Postoperative pneumocephalus has resolved. Previously identified infiltrating mass within the right cerebral hemisphere again seen, grossly stable. Focus of hemorrhage within the mass itself has slightly contracted in the interim, now measuring 1.6 x 1.1 x 1.3 cm, previously 1.9 x 1.2 x 1.5 cm. Trace residual hemorrhage along the biopsy tract noted as well, decreased. Similar surrounding vasogenic edema and regional mass effect without significant midline shift. No other new acute intracranial hemorrhage. No visible acute large vessel territory infarct. No other mass lesion or mass effect. No hydrocephalus. No significant extra-axial fluid collection. Vascular: No abnormal hyperdense vessel. Calcified atherosclerosis present at the skull base. Skull: Post craniotomy changes on the right. Skin staples remain in place. Sinuses/Orbits: Globes and orbital soft tissues demonstrate no acute finding. Paranasal sinuses and mastoid air cells remain largely clear. Other: Nasogastric tube in place. IMPRESSION: 1. Postoperative changes from prior right temporal craniotomy for tumor biopsy. Previously identified infiltrating mass within the right cerebral hemisphere is grossly  stable. Focus of hemorrhage within the mass itself has slightly contracted in the interim, now measuring 1.6 x 1.1 x 1.3 cm, previously 1.9 x 1.2 x 1.5 cm. Trace residual hemorrhage along the biopsy tract has decreased as well. Similar surrounding vasogenic edema and regional mass effect without significant midline shift. 2. No other new acute intracranial abnormality. Electronically Signed   By: Morene Hoard M.D.   On: 07/20/2024 21:01   CT HEAD WO CONTRAST Result Date: 07/18/2024 CLINICAL DATA:  Initial postoperative evaluation. EXAM: CT HEAD WITHOUT CONTRAST TECHNIQUE: Contiguous axial images were obtained from the base of the skull through the vertex without intravenous contrast. RADIATION DOSE REDUCTION: This exam was performed according to the departmental dose-optimization program which includes automated exposure control, adjustment of the mA and/or kV according to patient size and/or use of iterative reconstruction technique. COMPARISON:  Comparison made with prior CT from 07/08/2024 as well as previous exams. FINDINGS: Brain:  Postoperative changes from interval right temporal craniotomy for presumed biopsy of previously identified infiltrating right cerebral mass. Small amount of postoperative pneumocephalus overlies the right cerebral convexity. Focus of hemorrhage measuring up to 1.4 cm seen along the biopsy tract at the posterior right temporal lobe (series 2, image 15). Additional focus of hemorrhage within the resection cavity itself measures 1.9 x 1.2 x 1.5 cm (series 2, image 18). No other complicating features. The underlying infiltrating mass involving the right temporal lobe with extension into the right splenium is otherwise grossly stable. Surrounding vasogenic edema with mass effect on the posterior right lateral ventricle, stable. No midline shift. Basilar cisterns remain patent. Remainder of the brain is otherwise stable. No other acute intracranial hemorrhage. No acute large  vessel territory infarct. No other mass lesion or mass effect. No hydrocephalus or extra-axial fluid collection. Vascular: No abnormal hyperdense vessel. Calcified atherosclerosis present at the skull base. Skull: Post craniotomy changes on the right. Skin staples in subgaleal drain remain in place. No adverse features. Sinuses/Orbits: Globes orbital soft tissues demonstrate no acute finding. Paranasal sinuses are largely clear. Small right with trace left mastoid effusions, of doubtful significance. Nasogastric tube in place. Other: None. IMPRESSION: 1. Postoperative changes from interval right temporal craniotomy for biopsy of patient's known right cerebral mass. Small volume postoperative hemorrhage along the biopsy tract and within the resection cavity as above. No other complicating features. The underlying right cerebral mass is otherwise grossly stable. 2. No other new acute intracranial abnormality. Electronically Signed   By: Morene Hoard M.D.   On: 07/18/2024 02:12   DG CHEST PORT 1 VIEW Result Date: 07/17/2024 CLINICAL DATA:  [id number] Respiratory failure (HCC) [id number]. EXAM: PORTABLE CHEST 1 VIEW COMPARISON:  07/13/2024. FINDINGS: There is nonspecific heterogeneous opacity overlying the lateral aspect of the right mid lung zone, which was likely present on the prior radiograph. This is indeterminate. Bilateral lung fields are otherwise clear. No acute consolidation or lung collapse. Bilateral costophrenic angles are clear. Stable cardio-mediastinal silhouette. Presumed loop recorder device noted overlying the left lower chest. No acute osseous abnormalities. The soft tissues are within normal limits. Enteric tube is seen coursing below the left hemidiaphragm however, the tip and side hole are not included in the film. IMPRESSION: *No acute cardiopulmonary abnormality. Nonspecific heterogeneous opacity overlying the lateral aspect of the right mid lung zone, which was likely present on the prior  radiograph. This is indeterminate. Correlate clinically to determine the need for further evaluation with nonemergent chest CT scan. Electronically Signed   By: Ree Molt M.D.   On: 07/17/2024 08:18   DG Chest Port 1 View Result Date: 07/13/2024 CLINICAL DATA:  Respiratory distress. EXAM: PORTABLE CHEST 1 VIEW COMPARISON:  Chest radiograph dated 07/11/2024 FINDINGS: Enteric tube extends below the diaphragm with tip beyond the inferior margin of the image. Right-sided PICC with tip over central SVC. There is cardiomegaly with vascular congestion and edema, significantly progressed since the prior radiograph. Pneumonia is not excluded. A small left pleural effusion may be present. No pneumothorax. A loop recorder device. No acute osseous pathology. IMPRESSION: Cardiomegaly with findings of CHF, significantly progressed since the prior radiograph. Pneumonia is not excluded. Electronically Signed   By: Vanetta Chou M.D.   On: 07/13/2024 19:24   DG Chest Port 1 View Result Date: 07/11/2024 CLINICAL DATA:  [id number].  Pneumonia. EXAM: PORTABLE CHEST 1 VIEW COMPARISON:  Portable chest yesterday at 5:05 p.m. FINDINGS: 5:48 a.m. ETT tip is 3.6 cm from the carina. NGT  enters the stomach with the intragastric course not fully seen. Right PICC tip approaches the superior cavoatrial junction. There is a left chest loop recorder device. Stable cardiomegaly. There is mild perihilar vascular prominence but no overt edema. There are small layering pleural effusions, asymmetric consolidation left lower lobe. Remaining lungs appear clear.  No new or worsening findings. Stable mediastinum with calcification in the transverse aorta. Stable osseous structures. IMPRESSION: 1. No significant change from yesterday's study. 2. Stable cardiomegaly and mild perihilar vascular prominence. 3. Small layering pleural effusions and left lower lobe consolidation. Electronically Signed   By: Francis Quam M.D.   On: 07/11/2024 07:52   DG  CHEST PORT 1 VIEW Result Date: 07/10/2024 CLINICAL DATA:  Central line placement. Clinical notes confirm venous placement. EXAM: PORTABLE CHEST 1 VIEW COMPARISON:  Earlier today FINDINGS: Unchanged positioning of right upper extremity PICC, the tip projects over the mid mediastinum to the left of midline. Endotracheal tube tip 3.7 cm from the carina. Enteric tube tip below the diaphragm cardiomegaly is unchanged. Left chest wall loop recorder. Unchanged left lung base opacity and pleural effusion. No pneumothorax or new airspace disease. IMPRESSION: 1. Unchanged positioning of right upper extremity PICC, the tip projects over the mid mediastinum to the left of midline. Given confirmation of venous placement clinically, this is in the region of the mid SVC. 2. Unchanged left lung base opacity and pleural effusion. Electronically Signed   By: Wayne Gasman M.D.   On: 07/10/2024 20:41   DG CHEST PORT 1 VIEW Result Date: 07/10/2024 CLINICAL DATA:  [id number] S/P PICC central line placement [id number] EXAM: PORTABLE CHEST 1 VIEW COMPARISON:  Chest x-ray 07/10/2024 FINDINGS: Endotracheal tube terminates 4 cm above the carina. Right PICC with tip overlying mid mediastinum just inferior to the carina. Enteric tube courses below the hemidiaphragm with tip and side port collimated off view. Wireless cardiac device overlies the left chest. The heart and mediastinal contours are unchanged. Persistent left lower lung zone opacity. No pulmonary edema. No right pleural effusion. Possible left pleural effusion. No pneumothorax. No acute osseous abnormality. IMPRESSION: 1. Right PICC with tip overlying mid mediastinum just inferior to the carina. Question malpositioning/arterial positioning. Consider repeat chest x-ray and possible use of CT for further evaluation. 2. Persistent left lower lung zone opacity. Possible left pleural effusion. These results will be called to the ordering clinician or representative by the Radiologist  Assistant, and communication documented in the PACS or [employer]. Electronically Signed   By: Morgane  Naveau M.D.   On: 07/10/2024 16:00   US  EKG SITE RITE Result Date: 07/10/2024 If Site Rite image not attached, placement could not be confirmed due to current cardiac rhythm.  DG Chest Port 1 View Result Date: 07/10/2024 CLINICAL DATA:  Interest per dependence, endotracheal tube EXAM: PORTABLE CHEST 1 VIEW COMPARISON:  July 07, 2024 chest radiograph FINDINGS: Endotracheal tube in place. Partially imaged enteric tube. Unchanged cardiac loop recorder projecting over the left chest wall. Bibasilar opacities, slightly increased from prior and may represent atelectasis. Unchanged cardiomediastinal silhouette. IMPRESSION: Unchanged support devices. Slightly increased bibasilar opacities, possibly representing atelectasis. Electronically Signed   By: Michaeline Blanch M.D.   On: 07/10/2024 11:44   Overnight EEG with video Result Date: 07/08/2024 Shelton Arlin KIDD, MD     07/09/2024  7:10 AM Patient Name: Wayne Molina MRN: [id number] Epilepsy Attending: Arlin KIDD Shelton Referring Physician/Provider: Khaliqdina, Salman, MD Duration: 07/07/2024 9070 to 07/08/2024 9070 Patient history: 68 y.o. male with hx of  Htn, HLD, pAfibb on eliquis , OSA who presents to the ED with acute onset L arm weakness and L hemianopsia. EEG to evaluate for seizure Level of alertness: comatose/ lethargic AEDs during EEG study: LEV, propofol , Versed ,VPA Technical aspects: This EEG study was done with scalp electrodes positioned according to the 10-20 International system of electrode placement. Electrical activity was reviewed with band pass filter of 1-70Hz , sensitivity of 7 uV/mm, display speed of 4mm/sec with a 60Hz  notched filter applied as appropriate. EEG data were recorded continuously and digitally stored.  Video monitoring was available and reviewed as appropriate. Description: EEG showed continuous generalized and lateralized right  hemisphere 3 to 6 Hz theta-delta slowing admixed with 13-15hz  beta activity. At the beginning of the study, EEG also showed lateralized periodic discharges ( LPDs) with overriding fast activity at 1-1.5Hz  in right hemisphere. As sedation was adjusted, overriding fast activity improved Hyperventilation and photic stimulation were not performed.   ABNORMALITY - Lateralized periodic discharges with overriding fast activity ( LPD +F)  right hemisphere - Continuous slow, generalized and lateralized right hemisphere IMPRESSION: This study showed evidence of epileptogenicity and cortical dysfunction arising from right hemisphere likely due to underlying structural abnormality. This eeg pattern is on the ictal-interictal continuum with high risk of seizure recurrence. Additionally there is severe diffuse encephalopathy, likely related to sedation. No seizures were seen throughout the recording. Priyanka MALVA Krebs   CT HEAD WO CONTRAST ( ) Result Date: 07/08/2024 CLINICAL DATA:  Initial evaluation for acute mental status change, unknown cause. EXAM: CT HEAD WITHOUT CONTRAST TECHNIQUE: Contiguous axial images were obtained from the base of the skull through the vertex without intravenous contrast. RADIATION DOSE REDUCTION: This exam was performed according to the departmental dose-optimization program which includes automated exposure control, adjustment of the mA and/or kV according to patient size and/or use of iterative reconstruction technique. COMPARISON:  Comparison made with prior studies from 07/07/2024. FINDINGS: Brain: Examination somewhat technically limited by streak artifact from overlying EEG leads. Previously identified infiltrating mass involving the right temporal lobe and posterior right splenium again seen, relatively stable from prior. Surrounding vasogenic edema, also unchanged. No acute intracranial hemorrhage. No other acute large vessel territory infarct. No other mass lesion or mass effect. No  hydrocephalus or extra-axial fluid collection. Few small chronic cerebellar infarcts again noted. Vascular: No abnormal hyperdense vessel. Calcified atherosclerosis present at skull base. Skull: No acute finding.  EEG leads overlie the scalp. Sinuses/Orbits: Globes and orbital soft tissues within normal limits. Paranasal sinuses and mastoid air cells remain largely clear. Other: Endotracheal tube partially visualized. IMPRESSION: 1. Stable head CT with infiltrating mass involving the posterior right temporal lobe and splenium with surrounding vasogenic edema. 2. No other new acute intracranial abnormality. Electronically Signed   By: Morene Hoard M.D.   On: 07/08/2024 02:47   ECHOCARDIOGRAM COMPLETE Result Date: 07/07/2024    ECHOCARDIOGRAM REPORT   Patient Name:   Wayne Molina Date of Exam: 07/07/2024 Medical Rec #:  [id number]     Height:       69.0 in Accession #:    [id number]    Weight:       163.0 lb Date of Birth:  July 08, 1956      BSA:          1.894 m Patient Age:    68 years      BP:           104/66 mmHg Patient Gender: M  HR:           69 bpm. Exam Location:  Inpatient Procedure: 2D Echo, Color Doppler and Cardiac Doppler (Both Spectral and Color            Flow Doppler were utilized during procedure). Indications:    Stroke  History:        Patient has prior history of Echocardiogram examinations, most                 recent 06/11/2022.  Sonographer:    Tinnie Rgers RDCS Referring Phys: [id number] COURTNEY F HORTON IMPRESSIONS  1. Left ventricular ejection fraction, by estimation, is 60 to 65%. Left ventricular ejection fraction by PLAX is 61 %. The left ventricle has normal function. The left ventricle has no regional wall motion abnormalities. There is moderate left ventricular hypertrophy. Left ventricular diastolic function could not be evaluated.  2. Right ventricular systolic function is moderately reduced. The right ventricular size is mildly enlarged.  3. Left atrial size was  moderately dilated.  4. Right atrial size was severely dilated.  5. The mitral valve is grossly normal. Trivial mitral valve regurgitation.  6. The aortic valve is calcified. Aortic valve regurgitation is mild. Aortic valve sclerosis/calcification is present, without any evidence of aortic stenosis.  7. The inferior vena cava is dilated in size with <50% respiratory variability, suggesting right atrial pressure of 15 mmHg.  8. Cannot exclude a small PFO.  9. Rhythm strip during this exam demonstrates atrial fibrillation. Comparison(s): Changes from prior study are noted. 03/15/2024: LVEF 65-70%, mildly dilated RV with normal systolic function. FINDINGS  Left Ventricle: Left ventricular ejection fraction, by estimation, is 60 to 65%. Left ventricular ejection fraction by PLAX is 61 %. The left ventricle has normal function. The left ventricle has no regional wall motion abnormalities. The left ventricular internal cavity size was normal in size. There is moderate left ventricular hypertrophy. Left ventricular diastolic function could not be evaluated due to atrial fibrillation. Left ventricular diastolic function could not be evaluated. Right Ventricle: The right ventricular size is mildly enlarged. No increase in right ventricular wall thickness. Right ventricular systolic function is moderately reduced. Left Atrium: Left atrial size was moderately dilated. Right Atrium: Right atrial size was severely dilated. Pericardium: There is no evidence of pericardial effusion. Mitral Valve: The mitral valve is grossly normal. Trivial mitral valve regurgitation. Tricuspid Valve: The tricuspid valve is grossly normal. Tricuspid valve regurgitation is trivial. Aortic Valve: The aortic valve is calcified. Aortic valve regurgitation is mild. Aortic valve sclerosis/calcification is present, without any evidence of aortic stenosis. Pulmonic Valve: The pulmonic valve was normal in structure. Pulmonic valve regurgitation is not  visualized. Aorta: The aortic root and ascending aorta are structurally normal, with no evidence of dilitation. Venous: The inferior vena cava is dilated in size with less than 50% respiratory variability, suggesting right atrial pressure of 15 mmHg. IAS/Shunts: Cannot exclude a small PFO. EKG: Rhythm strip during this exam demonstrates atrial fibrillation.  LEFT VENTRICLE PLAX 2D LV EF:         Left            Diastology                ventricular     LV e' lateral: 14.10 cm/s                ejection                fraction by  PLAX is 61                %. LVIDd:         4.27 cm LVIDs:         2.88 cm LV PW:         1.29 cm LV IVS:        1.24 cm LVOT diam:     2.00 cm LV SV:         44 LV SV Index:   23 LVOT Area:     3.14 cm  RIGHT VENTRICLE            IVC RV S prime:     6.66 cm/s  IVC diam: 2.23 cm TAPSE (M-mode): 1.3 cm LEFT ATRIUM             Index        RIGHT ATRIUM           Index LA diam:        4.67 cm 2.47 cm/m   RA Area:     24.70 cm LA Vol (A2C):   73.9 ml 39.02 ml/m  RA Volume:   73.70 ml  38.91 ml/m LA Vol (A4C):   72.9 ml 38.49 ml/m LA Biplane Vol: 76.0 ml 40.13 ml/m  AORTIC VALVE LVOT Vmax:   76.60 cm/s LVOT Vmean:  47.000 cm/s LVOT VTI:    0.140 m  AORTA Ao Root diam: 3.04 cm Ao Asc diam:  2.92 cm  SHUNTS Systemic VTI:  0.14 m Systemic Diam: 2.00 cm Vinie Maxcy MD Electronically signed by Vinie Maxcy MD Signature Date/Time: 07/07/2024/4:18:11 PM    Final    DG Chest Portable 1 View Result Date: 07/07/2024 CLINICAL DATA:  Intubation. EXAM: PORTABLE CHEST 1 VIEW COMPARISON:  August 08, 2022. FINDINGS: Stable cardiomegaly. Endotracheal and nasogastric tubes are in grossly good position. Lungs are clear. Bony thorax is unremarkable. IMPRESSION: Endotracheal and nasogastric tubes are in grossly good position. Electronically Signed   By: Lynwood Landy Raddle M.D.   On: 07/07/2024 13:25   DG Abd Portable 1 View Result Date: 07/07/2024 CLINICAL DATA:  Nasogastric tube  placement. EXAM: PORTABLE ABDOMEN - 1 VIEW COMPARISON:  None Available. FINDINGS: Two images of the abdomen were obtained. On the second image, nasogastric tube tip is seen in expected position of proximal stomach. IMPRESSION: Nasogastric tube tip is seen in expected position of proximal stomach. Electronically Signed   By: Lynwood Landy Raddle M.D.   On: 07/07/2024 13:23   MR BRAIN W WO CONTRAST Result Date: 07/07/2024 CLINICAL DATA:  68 year old male with new onset seizure. Code stroke presentation. EXAM: MRI HEAD WITHOUT AND WITH CONTRAST TECHNIQUE: Multiplanar, multiecho pulse sequences of the brain and surrounding structures were obtained without and with intravenous contrast. CONTRAST:  7.5mL GADAVIST  GADOBUTROL  1 MMOL/ML IV SOLN COMPARISON:  Head CTs and CTA earlier today. FINDINGS: Study is mildly degraded by motion artifact despite repeated imaging attempts. Brain: Nodular, heterogeneously enhancing, and elongated tumor in the posterior right hemisphere tracks from the splenium of the corpus callosum near midline laterally into the right temporal lobe. Enhancing component of tumor encompasses an area of about 3 cm by close to 6 cm x 3.3 cm (AP by transverse by CC). Surrounding confluent T2 and FLAIR hyperintensity. Mild hemosiderin or mineralization on T2*. Abnormal diffusion which appears to be on the basis of hypercellularity, with mildly diminished T2 hyperintensity in those areas. Confluent periventricular involvement along the posterior right lateral ventricle, atrium.  No contralateral or additional abnormal enhancement identified. No dural thickening identified. No midline shift or significant intracranial mass effect at this time. Normal basilar cisterns. No superimposed restricted diffusion suggestive of acute infarction. No ventriculomegaly, extra-axial collection or acute intracranial hemorrhage. Cervicomedullary junction and pituitary are within normal limits. Small chronic infarcts in the  bilateral cerebellum, including right PICA territory. No other hemosiderin identified. No cortical encephalomalacia identified. Coronal thin slice imaging. Tumoral edema adjacent to the right hippocampal formation which otherwise appears within normal limits. Other mesial temporal lobe structures within normal limits. Vascular: Major intracranial vascular flow voids appear preserved. Following contrast the major dural venous sinuses are enhancing and appear to be patent. Skull and upper cervical spine: Negative. Visualized bone marrow signal is within normal limits. Sinuses/Orbits: Leftward gaze, otherwise negative. Paranasal Visualized paranasal sinuses and mastoids are stable and well aerated. Other: Visible internal auditory structures appear normal. Scalp EEG leads in place. IMPRESSION: 1. Positive for relatively large and infiltrative tumor with heterogeneous enhancement tracking from the splenium of the corpus callosum near midline into the right temporal lobe. Confluent regional tumoral edema. No midline shift or significant intracranial mass effect. Top differential considerations are Glioblastoma and CNS Lymphoma. 2. No other acute intracranial abnormality, with underlying small chronic cerebellar infarcts. Electronically Signed   By: VEAR Hurst M.D.   On: 07/07/2024 08:53   CT HEAD WO CONTRAST ( ) Result Date: 07/07/2024 CLINICAL DATA:  Initial evaluation for acute mental status change, unknown cause. EXAM: CT HEAD WITHOUT CONTRAST TECHNIQUE: Contiguous axial images were obtained from the base of the skull through the vertex without intravenous contrast. RADIATION DOSE REDUCTION: This exam was performed according to the departmental dose-optimization program which includes automated exposure control, adjustment of the mA and/or kV according to patient size and/or use of iterative reconstruction technique. COMPARISON:  Comparison made with CTs from earlier the same day. FINDINGS: Brain: IV contrast  materials on board related to CTA performed earlier the same evening. Now evident is a probable enhancing intra-axial mass involving the posterior right frontotemporal region, in the region of previously suspected right MCA infarct. The mass is enhancing and appears bilobed. Dominant component at the right temporal region measures 2.9 cm (series 5, image 15). Additional dominant component partially extending towards the midline at the splenium measures up to 3.7 cm (series 5, image 18). Surrounding hypodensity therefore likely reflects vasogenic edema. Mild mass effect on the adjacent right lateral ventricle without midline shift. No other visible mass lesion or abnormal enhancement. No acute intracranial hemorrhage. No acute large vessel territory infarct. No extra-axial fluid collection. Suspected small remote right cerebellar infarct noted. Vascular: IV contrast material seen throughout the intracranial vasculature. Calcified atherosclerosis present at skull base. Skull: Scalp soft tissues and calvarium demonstrate no new finding. Sinuses/Orbits: Globes orbital soft tissues demonstrate no acute finding. Paranasal sinuses and mastoid air cells remain clear. Other: None. IMPRESSION: 1. Now evident is an enhancing intra-axial mass involving the posterior right frontotemporal region, in the region of previously suspected right MCA infarct. The enhancing mass is bilobed, with dominant components measuring 2.9 cm and 3.7 cm at the right temporal lobe and splenium respectively. Surrounding vasogenic edema without midline shift. 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 Further evaluation with dedicated MRI, with and without contrast, recommended. 2. No other acute intracranial abnormality. Case briefly discussed by telephone at the time of interpretation on 07/07/2024 at 1:35 a.m. to provider [facility]. Electronically Signed   By: Morene Nicholes HERO.D.  On: 07/07/2024 01:46   CT  ANGIO HEAD NECK W WO CM (CODE STROKE) Result Date: 07/07/2024 CLINICAL DATA:  Initial evaluation for acute neuro deficit, stroke. EXAM: CT ANGIOGRAPHY HEAD AND NECK WITH AND WITHOUT CONTRAST TECHNIQUE: Multidetector CT imaging of the head and neck was performed using the standard protocol during bolus administration of intravenous contrast. Multiplanar CT image reconstructions and MIPs were obtained to evaluate the vascular anatomy. Carotid stenosis measurements (when applicable) are obtained utilizing NASCET criteria, using the distal internal carotid diameter as the denominator. RADIATION DOSE REDUCTION: This exam was performed according to the departmental dose-optimization program which includes automated exposure control, adjustment of the mA and/or kV according to patient size and/or use of iterative reconstruction technique. CONTRAST:  75mL OMNIPAQUE  IOHEXOL  350 MG/ML SOLN COMPARISON:  CT from earlier the same day. FINDINGS: CTA NECK FINDINGS Aortic arch: Aortic arch within normal limits for caliber standard branch pattern. Aortic atherosclerosis. No significant stenosis about the origin the great vessels. Right carotid system: No evidence of dissection, stenosis (50% or greater), or occlusion. Left carotid system: Left common and internal carotid arteries are patent without dissection. Mixed plaque about the left carotid bulb with estimated 50% stenosis by NASCET criteria. 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 Distal reconstitution at the V3 segments via muscular collaterals, greater on the right (series 5, image 165, 160). Vertebral arteries are patent as they course into the cranial vault. No visible stenosis or dissection. Skeleton: No worrisome osseous lesions.  Patient is edentulous. Other neck: No other acute finding. Upper chest: No other acute finding. Review of the MIP images confirms the above findings CTA HEAD  FINDINGS Anterior circulation: Both internal carotid arteries are patent through the siphons without stenosis. A1 segments patent bilaterally. Right A1 hypoplastic. Normal anterior communicating artery complex. Anterior cerebral arteries patent without stenosis. No M1 stenosis or occlusion. No proximal MCA branch occlusion. Distal MCA branches perfused and fairly symmetric. Posterior circulation: Right V4 segment patent without stenosis. Left V4 segment patent proximally to the takeoff of the left PICA, but largely acute lose distally to the vertebrobasilar junction. Left PICA patent. Right PICA not well seen. Basilar diminutive but patent without stenosis. Superior cerebral arteries patent bilaterally. Left PCA supplied via the basilar. Right PCA supplied via the basilar as well as a robust right posterior communicating artery. Both PCAs patent to their distal aspects without significant stenosis. Venous sinuses: Grossly patent allowing for timing the contrast bolus. Anatomic variants: As above.  No aneurysm. Review of the MIP images confirms the above findings IMPRESSION: 1. Negative CTA for acute large vessel occlusion or other emergent finding. 2. 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 Distal left V4 segment subsequently reoccludes beyond the takeoff of the left PICA. 3. Atheromatous change about the left carotid bulb with estimated 50% stenosis by NASCET criteria. Aortic Atherosclerosis (ICD10-I70.0). Electronically Signed   By: Morene Hoard M.D.   On: 07/07/2024 00:50   CT HEAD CODE STROKE WO CONTRAST Result Date: 07/07/2024 CLINICAL DATA:  Code stroke. Initial evaluation for acute neuro deficit, stroke. No other relevant history provided. EXAM: CT HEAD WITHOUT CONTRAST TECHNIQUE: Contiguous axial images were obtained from the base of the skull through the vertex without intravenous contrast. RADIATION DOSE  REDUCTION: This exam was performed according to the departmental dose-optimization program which includes automated exposure control, adjustment of the mA and/or kV according  to patient size and/or use of iterative reconstruction technique. COMPARISON:  None Available. FINDINGS: Brain: Cerebral volume within normal limits. 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 No associated hemorrhage. No significant mass effect. Gray-white matter differentiation otherwise maintained. No mass lesion or midline shift. No hydrocephalus or extra-axial fluid collection. Vascular: No convincing hyperdense vessel by CT. Scattered vascular calcifications noted within the carotid siphons. Skull: Scalp soft tissues within normal limits.  Calvarium intact. Sinuses/Orbits: Globes orbital soft tissues within normal limits. Paranasal sinuses and mastoid air cells are largely clear. Other: None. ASPECTS [facility] Stroke Program Early CT Score) - Ganglionic level infarction (caudate, lentiform nuclei, internal capsule, insula, M1-M3 cortex): 6 - Supraganglionic infarction (M4-M6 cortex): 2 Total score (0-10 with 10 being normal): 8 IMPRESSION: 1. 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 No associated hemorrhage or significant mass effect. 2. Aspects is 8. These results were communicated to Dr. Vanessa at 12:18 am on 07/07/2024 by text page via the [facility] messaging system. Electronically Signed   By: Morene Hoard M.D.   On: 07/07/2024 00:19    Labs:  Basic Metabolic Panel: Recent Labs  Lab 07/30/24 0623 07/30/24 0852 08/01/24 0438 08/03/24 0449  NA 135 134* 136 132*  K 4.7 4.8 4.7 5.1  CL 98 101 100 103  CO2 28 25 27 26   GLUCOSE 101* 102* 90 130*  BUN 32* 33* 40* 27*  CREATININE 0.72 0.78 0.79 0.97  CALCIUM  8.9 8.5* 8.8* 8.3*  MG 2.2 2.2 2.4 2.0  PHOS 4.4  --  4.5 3.5     CBC: Recent Labs  Lab 07/30/24 0623 08/01/24 0438 08/03/24 0449  WBC 15.0* 13.5* 11.4*  HGB 17.4* 16.5 14.3  HCT 50.9 48.3 42.7  MCV 90.1 89.6 90.9  PLT 571* 474* 357    CBG: Recent Labs  Lab 08/03/24 1611 08/03/24 2112 08/04/24 0607 08/04/24 1148 08/04/24 1626  GLUCAP 188* 166* 130* 191* 178*   Family history.  Mother with the dementia father with diabetes as well as heart disease.  Denies any colon cancer esophageal cancer or rectal cancer   Brief HPI:   Wayne Molina is a 68 y.o. right handed male with history significant for atrial fibrillation status post loop recorder 2017 per Dr.Croituru maintained on chronic Eliquis , atypical variant hypertrophic cardiomyopathy depression/anxiety, hypertension hyperlipidemia, colonic polyps, OSA with tobacco use.  Per chart review patient lives with spouse.  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  Reports 1 fall when she stepped off a deck a few weeks ago no loss of consciousness no other falls reported.  Presented 07/07/2024 with acute onset of left-sided weakness slurred speech and hemianopsia while at work.  Initial CT concerning for right MCA CVA however patient had witnessed seizure in the ER repeat CT was concerning for mass.  EEG showed evidence of a cortical dysfunction arising from right hemisphere likely due to underlying structural abnormality.  Additionally there was severe diffuse encephalopathy.  No seizure was seen throughout the recording.  Patient was loaded with Keppra  for seizure prophylaxis and subsequent MRI showed infiltrating tumor concerning for glioblastoma versus lymphoma.  Admission chemistries unremarkable except glucose 125.  Hospital course patient did require intubation for airway protection 8/29 - 9/5 for Enterobacter in the setting of progressive encephalopathy.  Patient underwent right craniotomy for biopsy of brain tumor 07/17/2024 per Dr.Janjua and  pathology consistent  with glioblastoma multiforme.  Medical oncology Dr. [name] consulted no current formal plan for treatment but to start in 2 to 3 weeks and latest cranial CT scan 07/26/2024 showed no hydrocephalus or acute changes.  Patient remained on Decadron  therapy as indicated.  He was cleared to resume chronic Eliquis .  Venous Doppler studies 07/23/2024 did show a right gastrocnemius vein DVT remained on chronic Eliquis  as indicated.  Remained on Keppra  for seizure prophylaxis.  Palliative care consulted to establish goals of care.  Leukocytosis 11,400-15,000 felt to be steroid-induced.  He did experience bouts of urinary retention with Foley tube/voiding trial maintained on Flomax .  Patient with decreased nutritional stores nasogastric tube initially in place advance to a mechanical soft diet he had refused gastrostomy tube placement.  Patient intermittent confusion and restlessness especially at nighttime maintained on Seroquel .  Therapy evaluations completed due to patient's decreased functional mobility was admitted for a comprehensive rehab program.   Hospital Course: Wayne Molina was admitted to rehab 08/04/2024 for inpatient therapies to consist of PT, ST and OT at least three hours five days a week. Past admission physiatrist, therapy team and rehab RN have worked together to provide customized collaborative inpatient rehab.  Pertaining to patient's glioblastoma status post right craniotomy 07/17/2024 per neurosurgery Dr.Janjua.  Decadron  therapy as indicated follow-up outpatient medical oncology Dr. [name].  Patient remained on chronic Eliquis  for history of atrial fibrillation cardiac rate controlled.  He did receive venous Doppler study showing right gastrocnemius vein remained on Eliquis .  No bleeding episodes.  Pain management with use of Neurontin  scheduled titrated as needed as well as oxycodone  for breakthrough pain.  Mood stabilization with melatonin for sleep he had initially been using  Seroquel  to help aid in restlessness.  Seizure prophylaxis with Keppra  ongoing no seizure activity EEG negative.  Urinary retention/neurogenic bladder Flomax  as indicated.  Lipitor ongoing for hyperlipidemia.  He did have a history of hypertension with hypertrophic cardiomyopathy Coreg  12.5 mg twice daily followed outpatient by cardiology services.  History of OSA with tobacco use monitoring of oxygen saturations every shift.  Hyperglycemia secondary to Decadron  low-dose insulin  therapy hemoglobin A1c of 6.0.  No insulin  at time of discharge as his Decadron  continued to be tapered.  His diet has slowly been advanced per speech therapy and monitored.  Hospital course wound of gluteal cleft suspect fungal etiology and conservative care.  Bouts of constipation with bowel program established.   Blood pressures were monitored on TID basis and remained controlled and monitored  Diabetes has been monitored with ac/hs CBG checks and SSI was use prn for tighter BS control.    Rehab course: During patient's stay in rehab weekly team conferences were held to monitor patient's progress, set goals and discuss barriers to discharge. At admission, patient required moderate assist sit to stand minimal assist step pivot transfers minimal assist 15 feet rolling walker  He/She  has had improvement in activity tolerance, balance, postural control as well as ability to compensate for deficits. He/She has had improvement in functional use RUE/LUE  and RLE/LLE as well as improvement in awareness.  Bed mobility completed with supervision and use of hospital bed features.  Donned shoes with assist for time management.  Sit to stand rolling walker minimal assist due to slight retropulsion.  Ambulated with contact-guard assist rolling walker from bed to toilet some cues for body with then walker.  Curb transfer training with 5 inch curb to simulate home entrance.  Patient completed times for curb transfers with  minimal assist and  rolling walker and mod to max instructions and step-by-step cues throughout.  Stair training completed using 6 inch steps and 2 handrails completing with step to pattern for both ascending and descending.  Supervision upper body bathing minimal assist lower body bathing supervision upper body dressing minimal assist lower body dressing.  Follow-up speech therapy for moderate oropharyngeal dysphagia silent aspiration and currently tolerating a mechanical soft diet.  Full family teaching completed plan discharge to home       Disposition:  There are no questions and answers to display.         Diet: Mechanical soft  Special Instructions: No driving smoking or alcohol  Follow-up outpatient medical oncology Dr. [name] for ongoing care of glioblastoma  Medications at discharge. 1.  Tylenol  as needed 2.  Eliquis  5 mg p.o. twice daily 3.  Lipitor 40 mg p.o. daily 4.  Coreg  12.5 mg p.o. twice daily 5.  Neurontin  100 mg p.o. 3 times daily 6.  Keppra  1000 mg p.o. twice daily 7.  Melatonin 3 mg p.o. nightly 8.  Oxycodone  5 mg every 6 hours as needed moderate pain 9.  Protonix  40 mg p.o. daily 10.  Flomax  0.4 mg daily 11.  Multivitamin daily 12.  Magnesium oxide 250 mg p.o. daily 13.  Vitamin B12 1 capsule daily 14.  Calcium  carbonate twice daily 15.  Seroquel  25 mg daily 16.  Decadron  2 mg every 12 hours until 10/15 and begin 2 mg daily 17.  Vitamin B12 1 capsule daily  30-35 minutes were spent completing discharge summary and discharge planning     Follow-up Information     Kirsteins, Prentice BRAVO, MD Follow up.   Specialty: Physical Medicine and Rehabilitation Why: Office to call for appointment Contact information: [address] [place] [address] [place] [address] [phone]         Janna Ferrier, DO Follow up.   Specialty: Family Medicine Why: Call for appointment Contact information: [address] [place] [address] [phone]         Rosslyn Dino HERO, MD Follow up.   Specialty: Neurosurgery Why: Call for appointment Contact information: [address] [place] [address] [phone]         [name] Arthea POUR, MD Follow up.   Specialties: Psychiatry, Neurology, Oncology Why: Call for appointment Contact information: [address] [place] [address] [phone]                 Signed: Toribio JINNY Pitch 08/04/2024, 6:33 PM

## 2024-08-04 NOTE — H&P (Signed)
 Physical Medicine and Rehabilitation Admission H&P        Chief Complaint  Patient presents with   Weakness   Near Syncope   Code Stroke  : HPI: Wayne Molina is a 68 year old right-handed male with history significant for atrial fibrillation status post loop recorder 2017 per Dr.Croitoru maintained on chronic Eliquis , atypical variant hypertrophic cardiomyopathy depression/anxiety, hypertension, hyperlipidemia, colonic polyps, OSA/tobacco use.  Per chart review patient lives with spouse.  1 level home with level entry.  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  Reports 1 fall when stepped off a deck a few weeks ago no loss of consciousness no other falls reported.  Presented 07/07/2024 with acute onset of left-sided weakness/slurred speech and hemianopsia while at work.  Initial CT concerning for right MCA CVA however patient had witnessed seizure in the ER repeat CT was concerning for mass.  EEG showed evidence of L lepto Jenise and cortical dysfunction arising from right hemisphere likely due to underlying structural abnormality.  Additionally there was severe diffuse encephalopathy.  No seizure was seen throughout the recording.  Patient was loaded with Keppra  for seizure prophylaxis and subsequent MRI showed infiltrating tumor concerning for glioblastoma versus lymphoma.  Admission chemistries unremarkable except glucose 125.  Hospital course patient did require intubation for airway protection 8/29 - 9/5 for Enterobacter in the setting of progressive encephalopathy.  Patient underwent right craniotomy for biopsy of brain tumor 07/17/2024 per Dr.Janjua and pathology consistent with glioblastoma multiforme.  Medical oncology Dr. [name] consulted and no current formal plan for treatment to start for 2-3 weeks and latest cranial CT scan 07/26/2024 showed no hydrocephalus without acute changes with similar vasogenic edema and mass effect..  Patient currently  remains on Decadron  therapy as indicated.  He had been cleared to resume chronic Eliquis .  Venous Doppler studies 07/23/2024 did show a right gastrocnemius vein patient remained on chronic Eliquis  as indicated.  He remains on Keppra  for seizure prophylaxis.  Palliative care was consulted to establish goals of care.  Leukocytosis 11,400-15,000 felt to be steroid-induced.  He did experience bouts of urinary retention with Foley/voiding trial and maintained on Flomax .  Patient with decreased nutritional storage initially with nasogastric tube for nutritional support currently on a dysphagia #3 thin liquid diet patient had refused gastrostomy tube placement for nutritional supplementation.  Patient has had intermittent confusion and restlessness especially at nighttime maintained on Seroquel .  Therapy evaluations completed due to patient decreased functional mobility cognitive deficits was admitted for a comprehensive rehab program.   Review of Systems  Constitutional:  Negative for chills and fever.  HENT:  Negative for hearing loss.   Eyes:  Negative for blurred vision and double vision.  Respiratory:  Negative for cough, shortness of breath and wheezing.   Cardiovascular:  Positive for palpitations. Negative for chest pain.  Gastrointestinal:  Positive for abdominal pain and constipation.  Genitourinary:  Positive for urgency. Negative for dysuria, flank pain and hematuria.       Urinary retention  Musculoskeletal:  Positive for myalgias.       Recent fall x 1 a few weeks ago  Skin:  Negative for rash.  Neurological:  Positive for speech change and focal weakness.  Psychiatric/Behavioral:  Positive for depression.        Anxiety as well as nighttime confusion  All other systems reviewed and are negative.      Past Medical History:  Diagnosis Date  Anxiety     Arthritis      fingers   Chronic atrial fibrillation (HCC)     Depression     Family history of breast cancer     Family history of  cancer of male genital organ     Family history of gene mutation      BRIP1   Family history of malignant neoplasm of gastrointestinal tract     Hyperlipidemia     Hypertension     Personal history of colonic polyps 11/12/2006   Sleep apnea               Past Surgical History:  Procedure Laterality Date   2-D echocardiogram   07/22/2010    Ejection fraction greater than 55%. Left atrium moderately dilated. Right atrium moderately dilated. 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 Mild to Moderate MR. Mild to moderate TR.   APPLICATION OF CRANIAL NAVIGATION Right 07/17/2024    Procedure: COMPUTER-ASSISTED NAVIGATION, FOR CRANIAL PROCEDURE;  Surgeon: Rosslyn Dino HERO, MD;  Location: [facility] OR;  Service: Neurosurgery;  Laterality: Right;  CRANIAL NAVIGATION   ATRIAL ABLATION SURGERY   2004   EP IMPLANTABLE DEVICE N/A 02/11/2016    Procedure: Loop Recorder Insertion;  Surgeon: Jerel Balding, MD;  Location: [facility] INVASIVE CV LAB;  Service: Cardiovascular;  Laterality: N/A;   Exercise Myoview stress test   07/08/2000    Nonischemic low-risk.   HIP FRACTURE SURGERY   1970s   IR GENERIC HISTORICAL   01/21/2017    IR RADIOLOGIST EVAL & MGMT 01/21/2017 [facility]   STERIOTACTIC STIMULATOR INSERTION Right 07/17/2024    Procedure: OPEN CRANIOTOMY FOR BIOPSY;  Surgeon: Rosslyn Dino HERO, MD;  Location: [facility] OR;  Service: Neurosurgery;  Laterality: Right;  RIGHT STEREOTACTIC BRAIN BIOPSY             Family History  Problem Relation Age of Onset   Dementia Mother     Heart disease Father     Diabetes Father     Other Sister          BRIP1 gene mutation   Kidney disease Brother     Colon cancer Maternal Uncle     Lung cancer Paternal Aunt          d. >50   Cancer Paternal Aunt          unknown type, d. >50   Cancer Cousin 17        gynecologic (paternal first cousin)   Colon cancer Nephew 27        arising in colon polyp   Cancer Niece 50        gynecologic; niece in her 40's   Breast cancer Niece 84    Other Niece          BRIP1 gene mutation   Cancer Paternal Great-grandmother          abdominal cancer (PGF's mother) great grandmother        Social History:  reports that he has quit smoking. His smoking use included cigarettes. He has never used smokeless tobacco. He reports that he does not drink alcohol and does not use drugs. Allergies:  Allergies  No Known Allergies         Medications Prior to Admission  Medication Sig Dispense Refill   apixaban  (ELIQUIS ) 5 MG TABS tablet Take 1 tablet (5 mg total) by mouth 2 (two) times daily. 180 tablet 1   atorvastatin  (LIPITOR) 40 MG tablet Take 1 tablet (40 mg total)  by mouth daily. 90 tablet 1   calcium  carbonate (OSCAL) 1500 (600 Ca) MG TABS tablet Take by mouth 2 (two) times daily with a meal.       Cyanocobalamin (VITAMIN B-12 PO) Take 1 capsule by mouth daily.       losartan  (COZAAR ) 100 MG tablet Take 1 tablet (100 mg total) by mouth daily. 90 tablet 3   Magnesium Oxide 250 MG TABS Take 1 tablet by mouth daily in the afternoon.       Multiple Vitamin (MULTIVITAMIN ADULT PO) Take 1 tablet by mouth daily.       Potassium Gluconate 2 MEQ TABS Take 1 tablet by mouth daily in the afternoon.       sildenafil  (VIAGRA ) 50 MG tablet Take 2 tablets (100 mg total) by mouth daily as needed. 10 tablet 2   cyclobenzaprine  (FLEXERIL ) 5 MG tablet Take 1 tablet (5 mg total) by mouth 2 (two) times daily as needed for muscle spasms. (Patient not taking: Reported on 07/07/2024) 10 tablet 0   lidocaine  (LIDODERM ) 5 % Place 1 patch onto the skin daily. Apply to affected area on neck/shoulder and keep in place for 12 hours and remove for 12 hours (Patient not taking: Reported on 07/07/2024) 10 patch 0              Home: Home Living Family/patient expects to be discharged to:: Private residence Living Arrangements: Spouse/significant other Available Help at Discharge: Family, Available 24 hours/day Type of Home: House Home Access: Level entry Home  Layout: One level Bathroom Shower/Tub: Engineer, manufacturing systems: Standard Home Equipment: None   Functional History: Prior Function Prior Level of Function : Independent/Modified Independent, History of Falls (last six months) Mobility Comments: No AD, x1 fall when stepped off deck a few weeks ago, but no other falls ADLs Comments: Works night shift in Proofreader at [employer]   Functional Status:  Mobility: Bed Mobility Overal bed mobility: Needs Assistance Bed Mobility: Supine to Sit, Sit to Supine Rolling: Min assist Sidelying to sit: HOB elevated, Used rails, Min assist Supine to sit: Supervision, HOB elevated, Used rails Sit to supine: Min assist, HOB elevated, Used rails Sit to sidelying: Mod assist, +2 for physical assistance, +2 for safety/equipment General bed mobility comments: in recliner Transfers Overall transfer level: Needs assistance Equipment used: Rolling walker (2 wheels) Transfers: Sit to/from Stand Sit to Stand: Min assist, Mod assist Bed to/from chair/wheelchair/BSC transfer type:: Step pivot Squat pivot transfers: Total assist, +2 safety/equipment Step pivot transfers: Min assist Transfer via Lift Equipment: Stedy General transfer comment: MinA to ModA for sit<>stand repetitions with assist to boost up. Cues for hand placement Ambulation/Gait Ambulation/Gait assistance: Min assist, +2 safety/equipment Gait Distance (Feet): 15 Feet (x15, x30, x15) Assistive device: Rolling walker (2 wheels) Gait Pattern/deviations: Step-to pattern, Trunk flexed, Decreased stride length General Gait Details: short and shuffling steps with cues for RW proximity and upright posture. Slightly able to increase step length with cueing. MinA for RW management Gait velocity: decr Pre-gait activities: static standing marches   ADL: ADL Overall ADL's : Needs assistance/impaired Eating/Feeding: Set up, Bed level Eating/Feeding Details (indicate cue type and reason): sipping from  straw in cup without spillage; provided meal and utensil setup on lunch tray Grooming: Wash/dry hands, Wash/dry face, Oral care, Minimal assistance, Cueing for sequencing, Standing Grooming Details (indicate cue type and reason): cued to terminate oral care task and for completed sequencing, assist for stability Upper Body Bathing: Moderate assistance, Sitting Lower Body Bathing: Maximal  assistance Upper Body Dressing : Minimal assistance, Cueing for sequencing, Bed level (HOB elevated) Upper Body Dressing Details (indicate cue type and reason): donned clean gown, cues for sequencing and assist to thoroughly thread LUE Lower Body Dressing: Contact guard assist, Sitting/lateral leans Lower Body Dressing Details (indicate cue type and reason): donned B socks via hip hike technique, inc time with LLE sock Toilet Transfer: Moderate assistance, +2 for physical assistance Toilet Transfer Details (indicate cue type and reason): stand step using RW to recliner Toileting- Clothing Manipulation and Hygiene: Total assistance Functional mobility during ADLs: Moderate assistance, +2 for physical assistance   Cognition: Cognition Overall Cognitive Status: Impaired/Different from baseline Arousal/Alertness: Lethargic Orientation Level: Oriented X4 Year: 2025 Month: September Day of Week: Incorrect Attention: Sustained Sustained Attention: Impaired Sustained Attention Impairment: Verbal basic, Functional basic Memory: Impaired Memory Impairment: Decreased recall of new information Awareness: Impaired Awareness Impairment: Intellectual impairment Problem Solving: Impaired Problem Solving Impairment: Verbal basic, Functional basic Executive Function: Initiating Initiating: Impaired Initiating Impairment: Verbal basic, Functional basic Safety/Judgment: Impaired Cognition Arousal: Alert Behavior During Therapy: Flat affect Overall Cognitive Status: Impaired/Different from baseline   Physical  Exam: Blood pressure (!) 97/57, pulse 65, temperature (!) 97.5 F (36.4 C), temperature source Oral, resp. rate 18, height 5' 9 (1.753 m), weight 77 kg, SpO2 99%.   Constitutional: No apparent distress. Appropriate appearance for age.  Sitting upright in bed. HENT: No JVD. Neck Supple. Trachea midline. Atraumatic, normocephalic. Eyes: PERRLA. EOMI. Visual fields grossly intact.  Cardiovascular: RRR, no murmurs/rub/gallops. No Edema. Peripheral pulses 2+  Respiratory: CTAB. No rales, rhonchi, or wheezing. On RA.  Abdomen: + bowel sounds, normoactive. Mild distention and moderate tenderness.  GU: Not examined. +Foley, draining clear urine.  Skin: C/D/I. No apparent lesions. Red discoloration of bilateral toes without wounds, brisk capillary refill. R craniotomy with staples intact, sutures at distal ends, well approximated without drainage.  MSK:      No apparent deformity. Plantarflexion positioning of feet, can range to neutral.       Neurologic exam:  Cognition: AAO to person, place, time and event.  Language: Fluent, No substitutions or neoglisms. + Moderate dysarthria and hypophonia.  Memory: + Mild memory deficits. + Attention deficits Insight: Fair insight into current condition.  Mood: Pleasant affect, appropriate mood.  Sensation: To light touch intact in BL UEs and LEs  Reflexes: 2+ in BL UE and LEs. Negative Hoffman's and babinski signs bilaterally.  CN: 2-12 grossly intact.  Coordination: No apparent tremors.  Mild left sided ataxia  on FTN  Spasticity: MAS 0 in all extremities.       Strength:                RUE: 4/5 SA, 5-/5 EF, 5-/5 EE, 4/5 WE, 4/5 FF, 4/5 FA                LUE:  4/5 SA, 4/5 EF, 4/5 EE, 4-/5 WE, 4-/5 FF, 4-/5 FA                RLE: 4/5 HF, 5/5 KE, 5/5  DF, 5/5  EHL, 5/5  PF                 LLE:  4/5 HF, 5/5 KE, 5/5  DF, 5/5  EHL, 5/5  PF         Lab Results Last 48 Hours        Results for orders placed or performed during the hospital encounter of  07/06/24 (from the  past 48 hours)  Glucose, capillary     Status: Abnormal    Collection Time: 08/02/24  6:19 AM  Result Value Ref Range    Glucose-Capillary 124 (H) 70 - 99 mg/dL      Comment: Glucose reference range applies only to samples taken after fasting for at least 8 hours.    Comment 1 Notify RN      Comment 2 Document in Chart    Glucose, capillary     Status: Abnormal    Collection Time: 08/02/24 11:59 AM  Result Value Ref Range    Glucose-Capillary 206 (H) 70 - 99 mg/dL      Comment: Glucose reference range applies only to samples taken after fasting for at least 8 hours.    Comment 1 Notify RN      Comment 2 Document in Chart    Glucose, capillary     Status: Abnormal    Collection Time: 08/02/24  3:47 PM  Result Value Ref Range    Glucose-Capillary 172 (H) 70 - 99 mg/dL      Comment: Glucose reference range applies only to samples taken after fasting for at least 8 hours.    Comment 1 Notify RN      Comment 2 Document in Chart    Glucose, capillary     Status: Abnormal    Collection Time: 08/02/24  5:15 PM  Result Value Ref Range    Glucose-Capillary 148 (H) 70 - 99 mg/dL      Comment: Glucose reference range applies only to samples taken after fasting for at least 8 hours.  Glucose, capillary     Status: Abnormal    Collection Time: 08/02/24  9:22 PM  Result Value Ref Range    Glucose-Capillary 211 (H) 70 - 99 mg/dL      Comment: Glucose reference range applies only to samples taken after fasting for at least 8 hours.  Iron and TIBC     Status: Abnormal    Collection Time: 08/03/24  4:49 AM  Result Value Ref Range    Iron 136 45 - 182 ug/dL    TIBC 737 749 - 549 ug/dL    Saturation Ratios 52 (H) 17.9 - 39.5 %    UIBC 126 ug/dL      Comment: Performed at [facility], [address], [place], [place] [address]  Ferritin     Status: Abnormal    Collection Time: 08/03/24  4:49 AM  Result Value Ref Range    Ferritin 648 (H) 24 - 336 ng/mL      Comment:  Performed at [facility], [address] [place], [place] [address]  CBC     Status: Abnormal    Collection Time: 08/03/24  4:49 AM  Result Value Ref Range    WBC 11.4 (H) 4.0 - 10.5 K/uL    RBC 4.70 4.22 - 5.81 MIL/uL    Hemoglobin 14.3 13.0 - 17.0 g/dL    HCT 57.2 60.9 - 47.9 %    MCV 90.9 80.0 - 100.0 fL    MCH 30.4 26.0 - 34.0 pg    MCHC 33.5 30.0 - 36.0 g/dL    RDW 87.1 88.4 - 84.4 %    Platelets 357 150 - 400 K/uL    nRBC 0.0 0.0 - 0.2 %      Comment: Performed at [facility], [address], [place], [place] [address]  Magnesium     Status: None  Collection Time: 08/03/24  4:49 AM  Result Value Ref Range    Magnesium 2.0 1.7 - 2.4 mg/dL      Comment: Performed at [facility], [address], [place], [place] [address]  Phosphorus     Status: None    Collection Time: 08/03/24  4:49 AM  Result Value Ref Range    Phosphorus 3.5 2.5 - 4.6 mg/dL      Comment: Performed at [facility], [address] [place], [place] [address]  Basic metabolic panel     Status: Abnormal    Collection Time: 08/03/24  4:49 AM  Result Value Ref Range    Sodium 132 (L) 135 - 145 mmol/L    Potassium 5.1 3.5 - 5.1 mmol/L    Chloride 103 98 - 111 mmol/L    CO2 26 22 - 32 mmol/L    Glucose, Bld 130 (H) 70 - 99 mg/dL      Comment: Glucose reference range applies only to samples taken after fasting for at least 8 hours.    BUN 27 (H) 8 - 23 mg/dL    Creatinine, Ser 9.02 0.61 - 1.24 mg/dL    Calcium  8.3 (L) 8.9 - 10.3 mg/dL    GFR, Estimated >39 >39 mL/min      Comment: (NOTE) Calculated using the CKD-EPI Creatinine Equation (2021)      Anion gap 3 (L) 5 - 15      Comment: Performed at [facility], [address] [place], [place] [address]  Glucose, capillary     Status: Abnormal    Collection Time: 08/03/24  6:16 AM  Result Value Ref Range    Glucose-Capillary 116 (H) 70 - 99 mg/dL      Comment: Glucose reference range applies only to samples taken after  fasting for at least 8 hours.  Glucose, capillary     Status: Abnormal    Collection Time: 08/03/24 11:49 AM  Result Value Ref Range    Glucose-Capillary 178 (H) 70 - 99 mg/dL      Comment: Glucose reference range applies only to samples taken after fasting for at least 8 hours.  Glucose, capillary     Status: Abnormal    Collection Time: 08/03/24  4:11 PM  Result Value Ref Range    Glucose-Capillary 188 (H) 70 - 99 mg/dL      Comment: Glucose reference range applies only to samples taken after fasting for at least 8 hours.  Glucose, capillary     Status: Abnormal    Collection Time: 08/03/24  9:12 PM  Result Value Ref Range    Glucose-Capillary 166 (H) 70 - 99 mg/dL      Comment: Glucose reference range applies only to samples taken after fasting for at least 8 hours.      Imaging Results (Last 48 hours)  No results found.         Blood pressure (!) 97/57, pulse 65, temperature (!) 97.5 F (36.4 C), temperature source Oral, resp. rate 18, height 5' 9 (1.753 m), weight 77 kg, SpO2 99%.   Medical Problem List and Plan: 1. Functional deficits secondary to GBM.  Status post right craniotomy and biopsy 07/17/2024 per Dr.Janjua.  Continue Decadron  therapy follow-up outpatient medical oncology Dr. [name]             -patient may  shower             -ELOS/Goals: 5-7 days, Min A PT/OT, SPV SLP -  goal is to optimize functional status for upcoming cancer treatment therapies.  Tentative goal to discharge to appointment with Dr. [name] 10-2  - Stable for admission to inpatient rehab  2.  Antithrombotics: -DVT/anticoagulation: Right gastrocnemius DVT  Pharmaceutical: Eliquis  chronic for history of atrial fibrillation             -antiplatelet therapy: N/A 3. Pain Management: Neurontin  100 mg 3 times daily, oxycodone  as needed 4. Mood/Behavior/Sleep: Melatonin 3 mg nightly.  Provide emotional support             -antipsychotic agents: Seroquel  25 mg daily as well as 12.5 mg nightly as needed  agitation 5. Neuropsych/cognition: This patient is not capable of making decisions on his own behalf. 6. Skin/Wound Care: Routine skin checks 7. Fluids/Electrolytes/Nutrition: Routine ins and outs with follow-up chemistries 8.  Seizure prophylaxis.  Keppra  1000 mg twice daily 9.  Urinary retention/neurogenic bladder.  Flomax  0.4 mg daily.  Foley tube.  Plan voiding trial 10.  Hyperlipidemia.  Lipitor 11.  Hypertension/hypertrophic cardiomyopathy.  Coreg  12.5 mg twice daily.  Monitor with increased mobility 12.  OSA/tobacco use.  Check oxygen saturations every shift 13.  Atrial fibrillation.  Status post loop recorder 2017.  Followed by Dr.Croitoru.  Continue Eliquis .  Cardiac rate controlled 14.  Hyperglycemia secondary to Decadron .  Hemoglobin A1c 6.0.  Currently on Lantus  8 units nightly.  Check blood sugars 3 times daily 15.  Decreased nutritional storage.  Dysphagia #3 thin liquid diet.  Follow-up dietary services 16.  Wound of gluteal cleft.  Suspect fungal etiology. 17.  Constipation/neurogenic bowel.  Had aggressive bowel treatment 9-25, having continent daily movement since.  Toribio PARAS Angiulli, PA-C 08/04/2024

## 2024-08-04 NOTE — Discharge Instructions (Addendum)
 Inpatient Rehab Discharge Instructions  Wayne Molina Discharge date and time: No discharge date for patient encounter.   Activities/Precautions/ Functional Status: Activity: As tolerated Diet: Mechanical soft Wound Care: Routine skin checks Functional status:  ___ No restrictions     ___ Walk up steps independently ___ 24/7 supervision/assistance   ___ Walk up steps with assistance ___ Intermittent supervision/assistance  ___ Bathe/dress independently ___ Walk with walker     _x__ Bathe/dress with assistance ___ Walk Independently    ___ Shower independently ___ Walk with assistance    ___ Shower with assistance ___ No alcohol     ___ Return to work/school ________  Special Instructions: No driving smoking or alcohol  COMMUNITY REFERRALS UPON DISCHARGE:    Home Health:   PT    OT      SP                Agency:[facility]    Phone:[phone]    Medical Equipment/Items Ordered:wheelchair and 3 in 1  wife to get rolling walker and tub equipment on own since not covered                                                 Agency/Supplier:[employer]   [phone]    My questions have been answered and I understand these instructions. I will adhere to these goals and the provided educational materials after my discharge from the hospital.  Patient/Caregiver Signature _______________________________ Date __________  Clinician Signature _______________________________________ Date __________  Please bring this form and your medication list with you to all your follow-up doctor's appointments.

## 2024-08-04 NOTE — Progress Notes (Signed)
 Report called and patient discharged to [facility] room 21. Left unit on bed pushed by this writer and NT accompanied by wife and other family members. Arrived in stable condition.

## 2024-08-04 NOTE — Assessment & Plan Note (Signed)
 Continues to have improved PO intake. Patient not wanting PEG tube, but open to discussion if needed. - RD consulted, appreciate recs - Ensure QID - Protonix  40 mg daily

## 2024-08-04 NOTE — Progress Notes (Signed)
 PMR Admission Coordinator Pre-Admission Assessment   Patient: Wayne Molina is an 68 y.o., male MRN: [id number] DOB: 1955-11-29 Height: 5' 9 (175.3 cm) Weight: 77 kg                                                                                                                                                  Insurance Information HMO:     PPO:      PCP:      IPA:      80/20:      OTHER:  PRIMARY: [employer]      Policy#: [id number]      Subscriber: pt CM Name: Thersia Drown      Phone#: not provided     Fax#: [phone] Pre-Cert#: [id number] auth for CIR from [place] with [facility] for admit 9/26 with next review date 08/11/2024.  Updates due to [place] at fax listed above.        Employer:  Benefits:  Phone #: [phone]     Name:  Eff. Date: 11/10/23     Deduct: $0      Out of Pocket Max: [phone] (met $345)      Life Max: n/a  CIR: $395/day for days 1-5      SNF: 20 full days  Outpatient:      Co-Pay: $45/visit Home Health: 100%      Co-Pay:  DME: 80%     Co-Pay: 20% Providers:  SECONDARY: Tricare for Life      Policy#: [id number]      Phone#:    Artist:       Phone#:    The Data processing manager" for patients in Inpatient Rehabilitation Facilities with attached "Privacy Act Statement-Health Care Records" was provided and verbally reviewed with: Patient and Family   Emergency Contact Information Contact Information       Name Relation Home Work Mobile    [place] Spouse     [phone]    Akhavan,Rodney Brother [phone]        Sylvester,Angelina Daughter [phone]   [phone]         Other Contacts   None on File      Current Medical History  Patient Admitting Diagnosis: Glioblastoma    History of Present Illness:  Wayne Molina is a 68 y/o male with PMH of AF (loop recorder 2017 per Dr.Croitoru maintained on chronic Eliquis ), atypical variant hypertrophic cardiomyopathy, depression/anxiety, HTN,  OSA/tobacco use who presented to  [facility] on 07/07/2024 with acute onset of left-sided weakness/slurred speech and hemianopsia while at work.  Initial CT concerning for right MCA CVA however patient had witnessed seizure in the ER repeat CT was concerning for mass.  EEG showed evidence of L lepto Jenise and cortical dysfunction arising from right hemisphere likely due to underlying structural abnormality.  Additionally there was  severe diffuse encephalopathy.  No seizure was seen throughout the recording.  Patient was loaded with Keppra  for seizure prophylaxis and subsequent MRI showed infiltrating tumor concerning for glioblastoma versus lymphoma.  Admission chemistries unremarkable except glucose 125.  Hospital course patient did require intubation for airway protection 8/29 - 9/5 for Enterobacter in the setting of progressive encephalopathy.  Patient underwent right craniotomy for biopsy of brain tumor 07/17/2024 per Dr.Janjua and pathology consistent with glioblastoma multiforme.  Medical oncology Dr. [name] consulted and no current formal plan for treatment to start for 2-3 weeks and latest cranial CT scan 07/26/2024 showed no hydrocephalus without acute changes with similar vasogenic edema and mass effect..  Patient currently remains on Decadron  therapy as indicated.  He had been cleared to resume chronic Eliquis .  Venous Doppler studies 07/23/2024 did show a right gastrocnemius vein patient remained on chronic Eliquis  as indicated.  He remains on Keppra  for seizure prophylaxis.  Palliative care was consulted to establish goals of care.  Leukocytosis 11,400-15,000 felt to be steroid-induced.  He did experience bouts of urinary retention with Foley/voiding trial and maintained on Flomax .  Patient with decreased nutritional storage initially with nasogastric tube for nutritional support currently on a dysphagia #3, thin liquid diet. Patient has had intermittent confusion and restlessness, especially at nighttime, maintained on Seroquel .  Therapy  evaluations completed and pt was recommended for a comprehensive rehab program.    Complete NIHSS TOTAL: 4 Glasgow Coma Scale Score: 15   Patient's medical record from [facility] has been reviewed by the rehabilitation admission coordinator and physician.   Past Medical History      Past Medical History:  Diagnosis Date   Anxiety     Arthritis      fingers   Chronic atrial fibrillation (HCC)     Depression     Family history of breast cancer     Family history of cancer of male genital organ     Family history of gene mutation      BRIP1   Family history of malignant neoplasm of gastrointestinal tract     Hyperlipidemia     Hypertension     Personal history of colonic polyps 11/12/2006   Sleep apnea            Has the patient had major surgery during 100 days prior to admission? Yes   Family History  family history includes Breast cancer (age of onset: 79) in his niece; Cancer in his paternal aunt and paternal great-grandmother; Cancer (age of onset: 75) in his cousin; Cancer (age of onset: 76) in his niece; Colon cancer in his maternal uncle; Colon cancer (age of onset: 44) in his nephew; Dementia in his mother; Diabetes in his father; Heart disease in his father; Kidney disease in his brother; Lung cancer in his paternal aunt; Other in his niece and sister.     Current Medications   Current Medications    Current Facility-Administered Medications:    acetaminophen  (TYLENOL ) tablet 1,000 mg, 1,000 mg, Oral, QID, Gomes, Adriana, DO, 1,000 mg at 08/04/24 0908   albuterol  (PROVENTIL ) (2.5 MG/3ML) 0.083% nebulizer solution 2.5 mg, 2.5 mg, Nebulization, Q6H PRN, Pawar, Rahul, MD, 2.5 mg at 07/13/24 1416   apixaban  (ELIQUIS ) tablet 5 mg, 5 mg, Oral, BID, Nemecek, Amanda, MD, 5 mg at 08/04/24 0907   atorvastatin  (LIPITOR) tablet 40 mg, 40 mg, Oral, Daily, Delores Suzann HERO, MD, 40 mg at 08/04/24 0908   bismuth  subsalicylate (PEPTO BISMOL) 262 MG/15ML suspension 30 mL,  30 mL,  Oral, Q4H PRN, Suknaim, Kulkaew B, DO, 30 mL at 08/03/24 1615   carvedilol  (COREG ) tablet 12.5 mg, 12.5 mg, Oral, BID, Tharon Lung, MD, 12.5 mg at 08/04/24 0907   Chlorhexidine  Gluconate Cloth 2 % PADS 6 each, 6 each, Topical, Daily, Delores Suzann HERO, MD, 6 each at 08/04/24 9075   clotrimazole  (LOTRIMIN ) 1 % cream, , Topical, BID, Tharon Lung, MD, Given at 08/04/24 9075   dexamethasone  (DECADRON ) tablet 2 mg, 2 mg, Oral, TID, 2 mg at 08/04/24 0859 **FOLLOWED BY** [START ON 08/10/2024] dexamethasone  (DECADRON ) tablet 2 mg, 2 mg, Oral, BID **FOLLOWED BY** [START ON 08/24/2024] dexamethasone  (DECADRON ) tablet 2 mg, 2 mg, Oral, Daily, Nemecek, Amanda, MD   feeding supplement (ENSURE PLUS HIGH PROTEIN) liquid 237 mL, 237 mL, Oral, TID PC & HS, Delores Suzann HERO, MD, 237 mL at 08/04/24 9173   gabapentin  (NEURONTIN ) capsule 100 mg, 100 mg, Oral, TID, Janna, Adriana, DO, 100 mg at 08/04/24 0909   Gerhardt's butt cream, , Topical, TID, Delores Suzann HERO, MD, Given at 08/04/24 [id number]   guaiFENesin  (ROBITUSSIN) 100 MG/5ML liquid 15 mL, 15 mL, Oral, Q6H PRN, Nygaard, Joseph, MD   insulin  aspart (novoLOG ) injection 0-9 Units, 0-9 Units, Subcutaneous, TID WC, Hammons, Kimberly B, RPH, 1 Units at 08/04/24 9370   insulin  glargine (LANTUS ) injection 8 Units, 8 Units, Subcutaneous, QHS, Nygaard, Joseph, MD, 8 Units at 08/03/24 2117   lactulose  (CHRONULAC ) 10 GM/15ML solution 10 g, 10 g, Oral, TID, Nemecek, Amanda, MD, 10 g at 08/04/24 9090   levETIRAcetam  (KEPPRA ) tablet 1,000 mg, 1,000 mg, Oral, BID, Yadav, Priyanka O, MD, 1,000 mg at 08/04/24 0908   liver oil-zinc  oxide (DESITIN) 40 % ointment, , Topical, BID PRN, Tharon Lung, MD, Given at 07/24/24 9081   melatonin tablet 3 mg, 3 mg, Oral, QHS, Tharon Lung, MD, 3 mg at 08/03/24 2118   ondansetron  (ZOFRAN ) tablet 4 mg, 4 mg, Oral, Q4H PRN **OR** ondansetron  (ZOFRAN ) injection 4 mg, 4 mg, Intravenous, Q4H PRN, Janjua, Rashid M, MD   Oral care mouth rinse, 15 mL, Mouth  Rinse, 4 times per day, Pawar, Rahul, MD, 15 mL at 08/04/24 0859   oxyCODONE  (Oxy IR/ROXICODONE ) immediate release tablet 5 mg, 5 mg, Oral, Q6H PRN, Suknaim, Kulkaew B, DO, 5 mg at 08/02/24 1428   pantoprazole  (PROTONIX ) EC tablet 40 mg, 40 mg, Oral, Daily, Delores Suzann HERO, MD, 40 mg at 08/04/24 0908   phenol (CHLORASEPTIC) mouth spray 1 spray, 1 spray, Mouth/Throat, PRN, Delores Suzann HERO, MD, 1 spray at 07/25/24 1130   QUEtiapine  (SEROQUEL ) tablet 12.5 mg, 12.5 mg, Oral, QHS PRN, Nygaard, Joseph, MD   QUEtiapine  (SEROQUEL ) tablet 25 mg, 25 mg, Oral, Daily, Mabe, Gerald, MD, 25 mg at 08/03/24 2118   senna (SENOKOT) tablet 8.6 mg, 1 tablet, Oral, Daily, Janna, Adriana, DO, 8.6 mg at 08/04/24 0909   tamsulosin  (FLOMAX ) capsule 0.4 mg, 0.4 mg, Oral, Daily, Janna, Adriana, DO, 0.4 mg at 08/04/24 0907     Patients Current Diet:  Diet Order                  DIET DYS 3 Room service appropriate? 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  Independent   Indoor Mobility: Did the patient need assistance with walking from room to room (with or without device)? Independent   Stairs: Did the patient need  assistance with internal or external stairs (with or without device)? Independent   Functional Cognition: Did the patient need help planning regular tasks such as shopping or remembering to take medications? Independent   Patient Information Are you of Hispanic, Latino/a,or Spanish origin?: A. No, not of Hispanic, Latino/a, or Spanish origin What is your race?: A. White Do you need or want an interpreter to communicate with a doctor or health care staff?: 0. No   Patient's Response To:  Health Literacy and Transportation Is the patient able to respond to health literacy and transportation needs?: Yes Health Literacy - How often do you need to have someone help you when you read instructions, pamphlets, or other written material from your doctor or pharmacy?: Never In the past 12 months, has lack of transportation kept you from medical appointments or from getting medications?: No In the past 12 months, has lack of transportation kept you from meetings, work, or from getting things needed for daily living?: No   Home Assistive Devices / Equipment Home Equipment: None   Prior Device Use: Indicate devices/aids used by the patient prior to current illness, exacerbation or injury? None of the above   Current Functional Level Cognition   Arousal/Alertness: Lethargic Overall Cognitive Status: Impaired/Different from baseline Orientation Level: Oriented X4 Attention: Sustained Sustained Attention: Impaired Sustained Attention Impairment: Verbal basic, Functional basic Memory: Impaired Memory Impairment: Decreased recall of new information Awareness: Impaired Awareness Impairment: Intellectual impairment Problem Solving: Impaired Problem Solving Impairment: Verbal basic, Functional basic Executive Function: Initiating Initiating: Impaired Initiating Impairment: Verbal basic, Functional basic Safety/Judgment: Impaired    Extremity Assessment (includes Sensation/Coordination)   Upper  Extremity Assessment: Left hand dominant, LUE deficits/detail LUE Deficits / Details: LUE grossly 3+/5 to MMT LUE Sensation: decreased light touch LUE Coordination: decreased fine motor, decreased gross motor  Lower Extremity Assessment: Generalized weakness RLE Deficits / Details: difficult to fully assess due to lethargy and cognitive and communication deficits but pt denies numbness/tingling bil with light touch but not very specific when asked to state where he was feeling the light touch bil (unsure if due to cog or communication though), able to extend knees against gravity but difficulty flexing hips against gravity with gross MMT scores of 2+ to 4- bil, questionable apraxia with movement as pt would sometimes do the opposite of what was cued LLE Deficits / Details: difficult to fully assess due to lethargy and cognitive and communication deficits but pt denies numbness/tingling bil with light touch but not very specific when asked to state where he was feeling the light touch bil (unsure if due to cog or communication though), able to extend knees against gravity but difficulty flexing hips against gravity with gross MMT scores of 2+ to 4- bil, questionable apraxia with  movement as pt would sometimes do the opposite of what was cued     ADLs   Overall ADL's : Needs assistance/impaired Eating/Feeding: Set up, Bed level Eating/Feeding Details (indicate cue type and reason): sipping from straw in cup without spillage; provided meal and utensil setup on lunch tray Grooming: Wash/dry hands, Wash/dry face, Oral care, Minimal assistance, Cueing for sequencing, Standing Grooming Details (indicate cue type and reason): cued to terminate oral care task and for completed sequencing, assist for stability Upper Body Bathing: Moderate assistance, Sitting Lower Body Bathing: Maximal assistance Upper Body Dressing : Minimal assistance, Cueing for sequencing, Bed level (HOB elevated) Upper Body Dressing  Details (indicate cue type and reason): donned clean gown, cues for sequencing and assist to thoroughly thread LUE Lower Body Dressing: Contact guard assist, Sitting/lateral leans Lower Body Dressing Details (indicate cue type and reason): donned B socks via hip hike technique, inc time with LLE sock Toilet Transfer: Moderate assistance, +2 for physical assistance Toilet Transfer Details (indicate cue type and reason): stand step using RW to recliner Toileting- Clothing Manipulation and Hygiene: Total assistance Functional mobility during ADLs: Moderate assistance, +2 for physical assistance     Mobility   Overal bed mobility: Needs Assistance Bed Mobility: Supine to Sit Rolling: Min assist Sidelying to sit: HOB elevated, Used rails, Min assist Supine to sit: Supervision, HOB elevated, Used rails Sit to supine: Min assist, HOB elevated, Used rails Sit to sidelying: Mod assist, +2 for physical assistance, +2 for safety/equipment General bed mobility comments: increased time     Transfers   Overall transfer level: Needs assistance Equipment used: Rolling walker (2 wheels) Transfers: Sit to/from Stand Sit to Stand: Min assist Bed to/from chair/wheelchair/BSC transfer type:: Step pivot Squat pivot transfers: Total assist, +2 safety/equipment Step pivot transfers: Min assist Transfer via Lift Equipment: Stedy General transfer comment: From EOB with min A for anterior weight shift and power up due to posterior bias     Ambulation / Gait / Stairs / Wheelchair Mobility   Ambulation/Gait Ambulation/Gait assistance: Editor, commissioning (Feet): 10 Feet Assistive device: Rolling walker (2 wheels) Gait Pattern/deviations: Step-to pattern, Trunk flexed, Decreased stride length, Wide base of support General Gait Details: Pt demonstrates short steps with stiff BLE and wide BOS behind RW. Cues for RW proximity, increased step length, and upright posture. Noted BUE stiffness with attempts to  bring RW closer. Gait velocity: decr Pre-gait activities: static standing marches     Posture / Balance Dynamic Sitting Balance Sitting balance - Comments: seated EOB without back support Balance Overall balance assessment: Needs assistance Sitting-balance support: No upper extremity supported, Feet supported Sitting balance-Leahy Scale: Good Sitting balance - Comments: seated EOB without back support Postural control: Posterior lean (initially retropulsive) Standing balance support: Bilateral upper extremity supported, Reliant on assistive device for balance, During functional activity Standing balance-Leahy Scale: Poor Standing balance comment: reliant on UE and external support     Special considerations/ Life events Special service needs oncology, palliative following        Previous Home Environment (from acute therapy documentation) Living Arrangements: Spouse/significant other Available Help at Discharge: Family, Available 24 hours/day Type of Home: House Home Layout: One level Home Access: Level entry Bathroom Shower/Tub: Engineer, manufacturing systems: Standard Home Care Services: No   Discharge Living Setting Plans for Discharge Living Setting: Patient's home, Lives with (comment) (spouse) Type of Home at Discharge: House Discharge Home Layout: One level Discharge Home Access: Level entry Discharge Bathroom Shower/Tub: Tub/shower unit Discharge  Bathroom Toilet: Standard Discharge Bathroom Accessibility: Yes How Accessible: Accessible via walker Does the patient have any problems obtaining your medications?: No   Social/Family/Support Systems Patient Roles: Spouse Anticipated Caregiver: spouse, [name], and brother, Adriana Anticipated Caregiver's Contact Information: [name]  [phone]; Adriana [phone] Ability/Limitations of Caregiver: min assist for w/c level, supervision for ambulatory level Caregiver Availability: 24/7 Discharge Plan Discussed with  Primary Caregiver: Yes Is Caregiver In Agreement with Plan?: Yes     Goals Patient/Family Goal for Rehab: PT/OT supervision to min assist; SLP supervision Expected length of stay: 5-7 days Additional Information: Discharge plan: home with spouse and brother for 24/7 support.  Will need short rehab stay to maximize functional status for treatment options with oncology Pt/Family Agrees to Admission and willing to participate: Yes Program Orientation Provided & Reviewed with Pt/Caregiver Including Roles  & Responsibilities: Yes     Decrease burden of Care through IP rehab admission: n/a     Possible need for SNF placement upon discharge: Not anticipated.  Plan for short rehab stay to maximize functional status with d/c home with family.      Patient Condition: This patient's condition remains as documented in the consult dated 08/03/24, in which the Rehabilitation Physician determined and documented that the patient's condition is appropriate for intensive rehabilitative care in an inpatient rehabilitation facility. Will admit to inpatient rehab today.   Preadmission Screen Completed By:  Reche FORBES Lowers, PT, DPT 08/04/2024 10:59 AM ______________________________________________________________________   Discussed status with Dr. Emeline on 08/04/24 at  11:07 AM  and received approval for admission today.   Admission Coordinator:  Caitlin E Warren, time 11:07 AM /Date09/26/25

## 2024-08-04 NOTE — Assessment & Plan Note (Signed)
 Did not require oxycodone  yesterday 9/25 after starting gabapentin  - Pain regimen: gabapentin  100 mg TID, acetaminophen  1000 mg QID, oxycodone  5 mg q6h PRN [trying to limit opioid use]

## 2024-08-04 NOTE — Progress Notes (Signed)
 Inpatient Rehabilitation Admission Medication Review by a Pharmacist  A complete drug regimen review was completed for this patient to identify any potential clinically significant medication issues.  High Risk Drug Classes Is patient taking? Indication by Medication  Antipsychotic Yes Quetiapine  - mood/agitation  Anticoagulant Yes Apixaban  - VTE treatment  Antibiotic No   Opioid Yes Oxycodone  - PRN pain  Antiplatelet No   Hypoglycemics/insulin  Yes Insulin  aspart, glargine- DM  Vasoactive Medication Yes Carvedilol  - HTN  Chemotherapy No   Other Yes Atorvastatin  - HLD Albuterol  - PRN SOB Pepto Bismol - PRN diarrhea Clotrimazole  cream - topical antifungal tx Dexamethasone  taper - cerebral edema  Gabapentin  - neuropathy Lactulose , senokot - constipation Levetiracetam  - seizure ppx Melatonin - PRN sleep Ondansetron  - PRN nausea/vomiting Pantoprazole  - GERd ppx Tamsulosin  - urinary retention     Type of Medication Issue Identified Description of Issue Recommendation(s)  Drug Interaction(s) (clinically significant)     Duplicate Therapy     Allergy     No Medication Administration End Date     Incorrect Dose     Additional Drug Therapy Needed     Significant med changes from prior encounter (inform family/care partners about these prior to discharge). Cyclobenzaprine , losartan , sildenafil  stopped on inpatient discharge  Supplements not reordered on CIR admission (Mg oxide, MVI, vitamin B-12)  New apixaban  for VTE treatment Communicate relevant medication changes to patient/family members at discharge from CIR.   Other       Clinically significant medication issues were identified that warrant physician communication and completion of prescribed/recommended actions by midnight of the next day:  No  Pharmacist comments: n/a   Time spent performing this drug regimen review (minutes): 20   Thank you for allowing pharmacy to be a part of this patient's care.  Shelba Collier, PharmD, BCPS Clinical Pharmacist

## 2024-08-04 NOTE — TOC Transition Note (Signed)
 Transition of Care [facility]) - Discharge Note   Patient Details  Name: Wayne Molina MRN: [id number] Date of Birth: 03-21-1956  Transition of [facility] CM/SW Contact:  Andrez JULIANNA George, RN Phone Number: 08/04/2024, 9:39 AM   Clinical Narrative:     Pt is discharging to CIR today. No further needs per IP Care management.  Final next level of care: IP Rehab Facility Barriers to Discharge: No Barriers Identified   Patient Goals and CMS Choice   CMS Medicare.gov Compare Post Acute Care list provided to:: Patient Choice offered to / list presented to : Patient      Discharge Placement                       Discharge Plan and Services Additional resources added to the After Visit Summary for                                       Social Drivers of Health (SDOH) Interventions SDOH Screenings   Food Insecurity: No Food Insecurity (03/22/2024)  Housing: Low Risk  (03/22/2024)  Transportation Needs: No Transportation Needs (03/22/2024)  Utilities: Not At Risk (04/16/2023)  Alcohol Screen: Low Risk  (04/16/2023)  Depression (PHQ2-9): Low Risk  (03/23/2024)  Financial Resource Strain: Low Risk  (03/22/2024)  Physical Activity: Sufficiently Active (03/22/2024)  Social Connections: Unknown (03/22/2024)  Stress: No Stress Concern Present (03/22/2024)  Tobacco Use: Medium Risk (07/07/2024)     Readmission Risk Interventions     No data to display

## 2024-08-04 NOTE — Progress Notes (Signed)
 Daily Progress Note Intern Pager: [phone]  Patient name: Wayne Molina Medical record number: [id number] Date of birth: [birth year] Age: 68 y.o. Gender: male  Primary Care Provider: Janna Ferrier, DO Consultants: Neurosurgery (signed-off), neurology (signed-off), oncology, palliative Code Status: DNR-Limited  Pt Overview and Major Events to Date:  8/29-admitted, focal seizure in ED, intubated, to the ICU 9/4-extubated 9/8-neurosurgery completed open craniotomy for tumor biopsy and confirmed glioblastoma 9/10-care resumed from ICU 9/15-started on heparin  9/18-switched to Eliquis  9/23-family meeting  Medical Decision Making:  Wayne Molina is a 68 y.o. male with PMH significant for A fib, OSA, osteoporosis, HTN, HLD admitted for seizure and found to have glioblastoma multiform. Patient medically stable for discharge to CIR; awaiting insurance approval. 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 - Palliative consulted, appreciate assistance in patient's care - Oncology consulted, appreciate recommendations - Repeat stat CT head if there is acute change in mental status or new neurological deficits - Keppra  1000 mg BID - Versed  prn for clinical seizures - PO Dexamethasone  2 mg Q8H (8am, 1pm, and 6pm along with other nightly medications at 6pm) - Taper per neurology: reduce to 2 mg q12h after 2 weeks (on 10/1), then after 2 weeks reduce to 2mg  once daily (on 10/15) - Glucose regimen: Lantus  8u at bedtime and SSI TID with meals while on steroids to maintain glucose 140-180 - Eliquis  5 mg BID - Cough: Guaifenesin  15 mL q6h prn, throat spray prn - Bowel Regimen: Senna 8.6 mg daily, Lactulose  10 g TID, pepto bismol 30 mL q4h PRN - Nausea regimen: Zofran  4 mg PRN - Continuous cardiac monitoring - PT/OT following - SLP following - Fall and seizure precautions - AM Labs: Holiday Bilateral leg pain Did not require oxycodone   yesterday 9/25 after starting gabapentin  - Pain regimen: gabapentin  100 mg TID, acetaminophen  1000 mg QID, oxycodone  5 mg q6h PRN [trying to limit opioid use] Malnutrition of moderate degree Continues to have improved PO intake. Patient not wanting PEG tube, but open to discussion if needed. - RD consulted, appreciate recs - Ensure QID - Protonix  40 mg daily Wound of gluteal cleft Irritant contact dermatitis due to fecal incontinence Suspected fungal etiology. - Clotrimazole  cream twice daily - Gerhardt's butt cream TID  - Desitin 40% ointment  - Turn patient every 2 hours Atrial fibrillation with RVR (HCC) Ventricular tachycardia (HCC) Occasional asymptomatic runs of Vtach. None recently. - Coreg  12.5 mg twice daily; consider increased dose if sustained run of Vtach - Eliquis  5 mg BID Thrombosis/embolism, venous Right gastrocnemius veins. - Anticoagulation as above Sundowning - Seroquel  25 mg at 10pm; additional Seroquel  12.5 mg PRN at 8pm - Melatonin 3 mg at 10pm Chronic health problem Urinary retention - Foley in place (failed void trail); on Flomax  0.4mg  HTN - controlled, continue home Coreg  12.5 mg BID. Holding home Losartan . Discontinued hydralazine  9/12. Systolic goal <160.  HLD - Continue home Atorvastatin  40mg  daily   FEN/GI: Dys 3 PPx: Eliquis  5 mg BID Dispo: CIR pending insurance approval  Subjective:  Patient was seen this morning as he was starting on his breakfast.  Wife also present and contributed to discussion.  Patient reports he is feeling well overall this morning, is hungry and ready to eat.  Per wife, he slept through the night last night, which is the first time he has done this this hospitalization.  He denies any pain in his bilateral legs, and reports he did not have any  of this pain yesterday.  He is ready and eager to head to CIR.  Objective: Temp:  [97.5 F (36.4 C)-98.4 F (36.9 C)] 97.5 F (36.4 C) (09/26 0400) Pulse Rate:  [64-99] 65 (09/26  0400) Resp:  [15-19] 18 (09/26 0400) BP: (97-134)/(57-92) 97/57 (09/26 0400) SpO2:  [97 %-100 %] 99 % (09/26 0400) Physical Exam: General: Patient in bed with head of bed elevated, starting to eat breakfast, no acute distress. Cardiovascular: Irregularly irregular, no murmurs/rubs/gallops. Respiratory: Normal work of breathing on room air.  Lungs clear to auscultation bilaterally, anteriorly. Abdomen: Bowel sounds present.  Abdomen soft, nondistended, nontender to palpation. Extremities: Legs warm, dry.  No bilateral extremity edema.  DP pulses 2+ bilaterally.  Laboratory: Most recent CBC Lab Results  Component Value Date   WBC 11.4 (H) 08/03/2024   HGB 14.3 08/03/2024   HCT 42.7 08/03/2024   MCV 90.9 08/03/2024   PLT 357 08/03/2024   Most recent BMP    Latest Ref Rng & Units 08/03/2024    4:49 AM  BMP  Glucose 70 - 99 mg/dL 869   BUN 8 - 23 mg/dL 27   Creatinine 9.38 - 1.24 mg/dL 9.02   Sodium 864 - 854 mmol/L 132   Potassium 3.5 - 5.1 mmol/L 5.1   Chloride 98 - 111 mmol/L 103   CO2 22 - 32 mmol/L 26   Calcium  8.9 - 10.3 mg/dL 8.3     Larraine Palma, MD 08/04/2024, 8:12 AM  PGY-1, [facility] FPTS Intern pager: [phone], text pages welcome Secure chat group [facility] Teaching Service

## 2024-08-04 NOTE — Progress Notes (Signed)
 Inpatient Rehab Admissions Coordinator:    I have insurance approval and a bed available for pt to admit to CIR today. Dr. Larraine and FMTS in agreement and [facility] aware.  I've notified pt/family and they are in agreement to admit today.  I will make arrangements.    Reche Lowers, PT, DPT Admissions Coordinator [phone] 08/04/24  10:52 AM

## 2024-08-04 NOTE — Progress Notes (Signed)
 Physical Therapy Treatment Patient Details Name: Wayne Molina MRN: [id number] DOB: 17-Jan-1956 Today's Date: 08/04/2024   History of Present Illness Pt is a 68 y/o male presenting on 8/28 with L arm weakness, L hemianopsia, and slurred speech. 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 Course complicated by seizure episode. Intubated 8/29, extubated 9/4 but still with difficulty managing secretions.  Biopsy on 9/8. +DVT R LE 9/14.  PMH includes: anxiety, HLD, HTN, sleep apnea, chronic afib.    PT Comments  Pt received in supine and agreeable to session. Pt requests to use the bathroom and is able to perform pericare in standing with CGA for safety. Pt able to perform hand hygiene at the sink with CGA for safety and cues for sequencing. Pt requires UE support on sink for balance with pt leaning onto forearms. Pt demonstrates impaired awareness and problem solving requiring increased cues and assist throughout session. Pt continues to benefit from PT services to progress toward functional mobility goals.    If plan is discharge home, recommend the following: Two people to help with walking and/or transfers;Two people to help with bathing/dressing/bathroom;Assistance with cooking/housework;Assistance with feeding;Direct supervision/assist for medications management;Assist for transportation;Direct supervision/assist for financial management;Supervision due to cognitive status   Can travel by Doctor, hospital (measurements PT);Wheelchair cushion (measurements PT)    Recommendations for Other Services       Precautions / Restrictions Precautions Precautions: Fall Recall of Precautions/Restrictions: Impaired Precaution/Restrictions Comments: delirium prevention; SBP <160 Restrictions Weight Bearing Restrictions Per Provider Order: No     Mobility  Bed  Mobility Overal bed mobility: Needs Assistance Bed Mobility: Supine to Sit     Supine to sit: Supervision, HOB elevated, Used rails     General bed mobility comments: increased time    Transfers Overall transfer level: Needs assistance Equipment used: Rolling walker (2 wheels) Transfers: Sit to/from Stand Sit to Stand: Min assist           General transfer comment: From EOB with min A for anterior weight shift and power up due to posterior bias    Ambulation/Gait Ambulation/Gait assistance: Min assist Gait Distance (Feet): 10 Feet (+15) Assistive device: Rolling walker (2 wheels) Gait Pattern/deviations: Step-to pattern, Trunk flexed, Decreased stride length, Wide base of support       General Gait Details: Pt demonstrates short steps with stiff BLE and wide BOS behind RW. Cues for RW proximity, increased step length, and upright posture. Noted BUE stiffness with attempts to bring RW closer. Assist for RW management during obstacle negotiation   Stairs             Wheelchair Mobility     Tilt Bed    Modified Rankin (Stroke Patients Only) Modified Rankin (Stroke Patients Only) Pre-Morbid Rankin Score: No symptoms Modified Rankin: Moderately severe disability     Balance Overall balance assessment: Needs assistance Sitting-balance support: No upper extremity supported, Feet supported Sitting balance-Leahy Scale: Good     Standing balance support: Bilateral upper extremity supported, Reliant on assistive device for balance, During functional activity Standing balance-Leahy Scale: Poor Standing balance comment: reliant on UE and external support                            Communication Communication Communication: Impaired Factors Affecting Communication: Reduced clarity of speech  Cognition Arousal: Alert Behavior During Therapy: Flat affect   PT - Cognitive impairments: Sequencing, Problem solving, Safety/Judgement                          Following commands: Impaired Following commands impaired: Follows multi-step commands inconsistently, Follows one step commands with increased time    Cueing Cueing Techniques: Verbal cues, Tactile cues, Visual cues  Exercises      General Comments        Pertinent Vitals/Pain Pain Assessment Pain Assessment: No/denies pain     PT Goals (current goals can now be found in the care plan section) Acute Rehab PT Goals Patient Stated Goal: to return to work PT Goal Formulation: With patient/family Time For Goal Achievement: 08/11/24 Progress towards PT goals: Progressing toward goals    Frequency    Min 3X/week       AM-PAC PT 6 Clicks Mobility   Outcome Measure  Help needed turning from your back to your side while in a flat bed without using bedrails?: A Little Help needed moving from lying on your back to sitting on the side of a flat bed without using bedrails?: A Little Help needed moving to and from a bed to a chair (including a wheelchair)?: A Little Help needed standing up from a chair using your arms (e.g., wheelchair or bedside chair)?: A Little Help needed to walk in hospital room?: A Lot Help needed climbing 3-5 steps with a railing? : Total 6 Click Score: 15    End of Session Equipment Utilized During Treatment: Gait belt Activity Tolerance: Patient tolerated treatment well Patient left: in chair;with call bell/phone within reach;with chair alarm set;with family/visitor present Nurse Communication: Mobility status PT Visit Diagnosis: Unsteadiness on feet (R26.81);Other abnormalities of gait and mobility (R26.89);Muscle weakness (generalized) (M62.81);Difficulty in walking, not elsewhere classified (R26.2);Other symptoms and signs involving the nervous system (R29.898)     Time: 0930-1004 PT Time Calculation (min) (ACUTE ONLY): 34 min  Charges:    $Gait Training: 8-22 mins $Therapeutic Activity: 8-22 mins PT General Charges $$  ACUTE PT VISIT: 1 Visit                     Darryle George, PTA Acute Rehabilitation Services Secure Chat Preferred  [phone]    Darryle George 08/04/2024, 12:19 PM

## 2024-08-04 NOTE — Assessment & Plan Note (Signed)
 Occasional asymptomatic runs of Vtach. None recently. - Coreg  12.5 mg twice daily; consider increased dose if sustained run of Vtach - Eliquis  5 mg BID

## 2024-08-04 NOTE — Plan of Care (Signed)
     Referral previously received for Wayne Molina for goals of care discussion. Noted most recent palliative in-person assessment dated 08/01/24 at which time it was recommended to follow from a distance/chart check.   Received message from PT that patient is requesting to complete advance directives. Included spiritual care/chaplain on the information and entered spiritual care consult for AD completion.  No plan for in person follow-up at this time. Please contact the palliative medicine provider on service for any new/urgent needs that require our assistance with this patient.  Thank you for your referral and allowing PMT to assist in Wayne Molina's care.   Camellia Kays, NP Palliative Medicine Team Phone: [phone]  NO CHARGE

## 2024-08-04 NOTE — Plan of Care (Signed)
  Problem: Clinical Measurements: Goal: Ability to maintain clinical measurements within normal limits will improve Outcome: Progressing Goal: Will remain free from infection Outcome: Progressing Goal: Diagnostic test results will improve Outcome: Progressing   Problem: Activity: Goal: Risk for activity intolerance will decrease Outcome: Progressing   Problem: Nutrition: Goal: Adequate nutrition will be maintained Outcome: Progressing   Problem: Elimination: Goal: Will not experience complications related to bowel motility Outcome: Progressing Goal: Will not experience complications related to urinary retention Outcome: Progressing   Problem: Pain Managment: Goal: General experience of comfort will improve and/or be controlled Outcome: Progressing   Problem: Safety: Goal: Ability to remain free from injury will improve Outcome: Progressing   Problem: Skin Integrity: Goal: Risk for impaired skin integrity will decrease Outcome: Progressing

## 2024-08-05 LAB — GLUCOSE, CAPILLARY
Glucose-Capillary: 142 mg/dL — ABNORMAL HIGH (ref 70–99)
Glucose-Capillary: 76 mg/dL (ref 70–99)
Glucose-Capillary: 163 mg/dL — ABNORMAL HIGH (ref 70–99)
Glucose-Capillary: 222 mg/dL — ABNORMAL HIGH (ref 70–99)

## 2024-08-05 MED ORDER — CHLORHEXIDINE GLUCONATE CLOTH 2 % EX PADS
6.0000 | MEDICATED_PAD | Freq: Two times a day (BID) | CUTANEOUS | Status: DC
  Administered 2024-08-05 – 2024-08-07 (×4): 6 via TOPICAL

## 2024-08-05 NOTE — Evaluation (Signed)
 Physical Therapy Assessment and Plan  Patient Details  Name: Wayne Molina MRN: [id number] Date of Birth: 1956-10-04  PT Diagnosis: Difficulty walking, Impaired cognition, and Muscle weakness Rehab Potential: Good ELOS: 7-10 days   Today's Date: 08/05/2024 PT Individual Time: 0803-0920 PT Individual Time Calculation (min): 77 min    Hospital Problem: Principal Problem:   GBM (glioblastoma multiforme) (HCC)   Past Medical History:  Past Medical History:  Diagnosis Date   Anxiety    Arthritis    fingers   Chronic atrial fibrillation (HCC)    Depression    Family history of breast cancer    Family history of cancer of male genital organ    Family history of gene mutation    BRIP1   Family history of malignant neoplasm of gastrointestinal tract    Hyperlipidemia    Hypertension    Personal history of colonic polyps 11/12/2006   Sleep apnea    Past Surgical History:  Past Surgical History:  Procedure Laterality Date   2-D echocardiogram  07/22/2010   Ejection fraction greater than 55%. Left atrium moderately dilated. Right atrium moderately dilated. 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 Mild to Moderate MR. Mild to moderate TR.   APPLICATION OF CRANIAL NAVIGATION Right 07/17/2024   Procedure: COMPUTER-ASSISTED NAVIGATION, FOR CRANIAL PROCEDURE;  Surgeon: Rosslyn Dino HERO, MD;  Location: [facility] OR;  Service: Neurosurgery;  Laterality: Right;  CRANIAL NAVIGATION   ATRIAL ABLATION SURGERY  2004   EP IMPLANTABLE DEVICE N/A 02/11/2016   Procedure: Loop Recorder Insertion;  Surgeon: Jerel Balding, MD;  Location: [facility] INVASIVE CV LAB;  Service: Cardiovascular;  Laterality: N/A;   Exercise Myoview stress test  07/08/2000   Nonischemic low-risk.   HIP FRACTURE SURGERY  1970s   IR GENERIC HISTORICAL  01/21/2017   IR RADIOLOGIST EVAL & MGMT 01/21/2017 [facility] RAD   STERIOTACTIC STIMULATOR INSERTION Right 07/17/2024   Procedure: OPEN CRANIOTOMY FOR BIOPSY;  Surgeon: Rosslyn Dino HERO, MD;  Location: [facility]  OR;  Service: Neurosurgery;  Laterality: Right;  RIGHT STEREOTACTIC BRAIN BIOPSY    Assessment & Plan Clinical Impression: Patient is a 68 y.o. right-handed male with history significant for atrial fibrillation status post loop recorder 2017 per Dr.Croitoru maintained on chronic Eliquis , atypical variant hypertrophic cardiomyopathy depression/anxiety, hypertension, hyperlipidemia, colonic polyps, OSA/tobacco use. Per chart review patient lives with spouse. 1 level home with level entry. 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 Reports 1 fall when stepped off a deck a few weeks ago no loss of consciousness no other falls reported. Presented 07/07/2024 with acute onset of left-sided weakness/slurred speech and hemianopsia while at work. Initial CT concerning for right MCA CVA however patient had witnessed seizure in the ER repeat CT was concerning for mass. EEG showed evidence of L lepto Jenise and cortical dysfunction arising from right hemisphere likely due to underlying structural abnormality. Additionally there was severe diffuse encephalopathy. No seizure was seen throughout the recording. Patient was loaded with Keppra  for seizure prophylaxis and subsequent MRI showed infiltrating tumor concerning for glioblastoma versus lymphoma. Admission chemistries unremarkable except glucose 125. Hospital course patient did require intubation for airway protection 8/29 - 9/5 for Enterobacter in the setting of progressive encephalopathy. Patient underwent right craniotomy for biopsy of brain tumor 07/17/2024 per Dr.Janjua and pathology consistent with glioblastoma multiforme. Medical oncology Dr. [name] consulted and no current formal plan for treatment to start for 2-3 weeks and latest cranial CT scan 07/26/2024 showed no hydrocephalus without acute changes with similar  vasogenic edema and mass effect.. Patient currently remains on Decadron  therapy as indicated. He had been cleared to  resume chronic Eliquis . Venous Doppler studies 07/23/2024 did show a right gastrocnemius vein patient remained on chronic Eliquis  as indicated. He remains on Keppra  for seizure prophylaxis. Palliative care was consulted to establish goals of care. Leukocytosis 11,400-15,000 felt to be steroid-induced. He did experience bouts of urinary retention with Foley/voiding trial and maintained on Flomax . Patient with decreased nutritional storage initially with nasogastric tube for nutritional support currently on a dysphagia #3 thin liquid diet patient had refused gastrostomy tube placement for nutritional supplementation. Patient has had intermittent confusion and restlessness especially at nighttime maintained on Seroquel . Therapy evaluations completed due to patient decreased functional mobility cognitive deficits was admitted for a comprehensive rehab program.  Patient transferred to CIR on 08/04/2024 .   Patient currently requires min assist with mobility secondary to muscle weakness, decreased cardiorespiratoy endurance, impaired timing and sequencing and decreased motor planning, decreased attention to left, decreased awareness, decreased problem solving, decreased memory, and delayed processing, and decreased standing balance and decreased balance strategies.  Prior to hospitalization, patient was independent  with mobility and lived with Spouse in a House home.  Home access is 1 threshold stepStairs to enter.  Patient will benefit from skilled PT intervention to maximize safe functional mobility, minimize fall risk, and decrease caregiver burden for planned discharge home with 24 hour supervision.  Anticipate patient will benefit from follow up HH at discharge.  PT - End of Session Activity Tolerance: Tolerates 30+ min activity with multiple rests Endurance Deficit: Yes PT Assessment Rehab Potential (ACUTE/IP ONLY): Good PT Barriers to Discharge: Decreased caregiver support;Incontinence;Insurance for SNF  coverage;Pending chemo/radiation PT Patient demonstrates impairments in the following area(s): Balance;Behavior;Endurance;Motor;Perception;Safety PT Transfers Functional Problem(s): Bed Mobility;Bed to Chair;Car;Furniture PT Locomotion Functional Problem(s): Ambulation;Wheelchair Mobility;Stairs PT Plan PT Intensity: Minimum of 1-2 x/day ,45 to 90 minutes PT Frequency: 5 out of 7 days PT Duration Estimated Length of Stay: 7-10 days PT Treatment/Interventions: Ambulation/gait training;DME/adaptive equipment instruction;Neuromuscular re-education;Psychosocial support;Stair training;UE/LE Strength taining/ROM;Wheelchair propulsion/positioning;Balance/vestibular training;Discharge planning;Pain management;Skin care/wound management;Therapeutic Activities;UE/LE Coordination activities;Visual/perceptual remediation/compensation;Therapeutic Exercise;Splinting/orthotics;Patient/family education;Functional mobility training;Disease management/prevention;Cognitive remediation/compensation PT Transfers Anticipated Outcome(s): CGA PT Locomotion Anticipated Outcome(s): CGA PT Recommendation Recommendations for Other Services: None Follow Up Recommendations: Home health PT;Outpatient PT;24 hour supervision/assistance Patient destination: Home Equipment Recommended: To be determined   PT Evaluation Precautions/Restrictions Precautions Precautions: Fall Recall of Precautions/Restrictions: Impaired Precaution/Restrictions Comments: recent R crani Restrictions Weight Bearing Restrictions Per Provider Order: No General   Vital SignsTherapy Vitals Pulse Rate: (!) 54 Resp: (!) 22 BP: 116/60 Patient Position (if appropriate): Sitting Oxygen Therapy SpO2: 99 % O2 Device: Room Air Pain Pain Assessment Pain Scale: 0-10 Pain Score: 0-No pain Pain Type: Acute pain Pain Location: Neck Pain Orientation: Lower Pain Descriptors / Indicators: Aching Pain Onset: On-going Patients Stated Pain Goal: 0 Pain  Interference Pain Interference Pain Effect on Sleep: 2. Occasionally Pain Interference with Therapy Activities: 2. Occasionally Pain Interference with Day-to-Day Activities: 2. Occasionally Home Living/Prior Functioning Home Living Available Help at Discharge: Family;Available 24 hours/day Type of Home: House Home Access: Stairs to enter [employer] of Steps: 1 threshold step Entrance Stairs-Rails: None Home Layout: One level Bathroom Shower/Tub: Engineer, manufacturing systems: Standard  Lives With: Spouse Prior Function Level of Independence: Independent with basic ADLs;Independent with gait  Able to Take Stairs?: Yes Driving: Yes Vocation: Full time employment Vocation Requirements: Freight forwarder at [employer] Vision/Perception  Vision - History Ability to See in Adequate  Light: 1 Impaired Vision - Assessment Eye Alignment: Impaired (comment) (L eye with medial orientation) Ocular Range of Motion: Impaired-to be further tested in functional context Alignment/Gaze Preference: Within Defined Limits Tracking/Visual Pursuits: Left eye does not track laterally Saccades: Impaired - to be further tested in functional context Convergence: Impaired - to be further tested in functional context Diplopia Assessment: Disappears with one eye closed Additional Comments: benefits from cues to attend to L environment Perception Perception: Impaired Preception Impairment Details: Inattention/Neglect Perception-Other Comments: Cues to scan left with functional mobility Praxis Praxis: Impaired Praxis Impairment Details: Motor planning  Cognition Overall Cognitive Status: Impaired/Different from baseline Arousal/Alertness: Lethargic Orientation Level: Oriented X4 Attention: Sustained Sustained Attention: Impaired Sustained Attention Impairment: Functional basic Memory: Impaired Memory Impairment: Retrieval deficit;Decreased short term memory Decreased Short Term Memory:  Functional basic Awareness: Impaired Awareness Impairment: Intellectual impairment Problem Solving: Impaired Problem Solving Impairment: Functional basic Executive Function: Reasoning;Decision Making;Organizing Reasoning: Impaired Organizing: Impaired Organizing Impairment: Functional basic Decision Making: Impaired Initiating: Impaired Initiating Impairment: Functional basic Safety/Judgment: Impaired Sensation Sensation Light Touch: Appears Intact Hot/Cold: Appears Intact Proprioception: Appears Intact Stereognosis: Not tested Coordination Gross Motor Movements are Fluid and Coordinated: No Fine Motor Movements are Fluid and Coordinated: Yes Motor  Motor Motor: Other (comment) Motor - Skilled Clinical Observations: Generalized weakness/ fatigue affecting gross and stabilizing motor control   Trunk/Postural Assessment  Cervical Assessment Cervical Assessment: Exceptions to [facility] (forward head, downward gaze) Thoracic Assessment Thoracic Assessment: Exceptions to [facility] (mild trunk flexion with rounded shoulders) Lumbar Assessment Lumbar Assessment: Exceptions to [facility] (mild posterior pelvic tilt in standing) Postural Control Postural Control: Deficits on evaluation Righting Reactions: delayed response  Balance Balance Balance Assessed: Yes Static Sitting Balance Static Sitting - Balance Support: Right upper extremity supported;Left upper extremity supported;Feet supported Static Sitting - Level of Assistance: 5: Stand by assistance Dynamic Sitting Balance Dynamic Sitting - Balance Support: Right upper extremity supported;Left upper extremity supported;Feet supported Dynamic Sitting - Level of Assistance: 5: Stand by assistance Dynamic Sitting - Balance Activities: Forward lean/weight shifting Static Standing Balance Static Standing - Balance Support: Right upper extremity supported;Left upper extremity supported;During functional activity Static Standing - Level of  Assistance: 4: Min assist Dynamic Standing Balance Dynamic Standing - Balance Support: Right upper extremity supported;Left upper extremity supported;During functional activity Dynamic Standing - Level of Assistance: 4: Min assist Dynamic Standing - Balance Activities: Forward lean/weight shifting;Lateral lean/weight shifting;Reaching for objects Extremity Assessment  RUE Assessment RUE Assessment: Within Functional Limits General Strength Comments: 4/5 LUE Assessment LUE Assessment: Within Functional Limits General Strength Comments: 4/5 RLE Assessment RLE Assessment: Exceptions to [facility] RLE Strength RLE Overall Strength: Deficits Right Hip Flexion: 3+/5 Right Hip Extension: 4-/5 Right Hip ABduction: 4/5 Right Hip ADduction: 4/5 Right Knee Flexion: 4/5 Right Knee Extension: 5/5 Right Ankle Dorsiflexion: 4/5 Right Ankle Plantar Flexion: 4/5 LLE Assessment LLE Assessment: Exceptions to WFL LLE Strength LLE Overall Strength: Deficits Left Hip Flexion: 4/5 Left Hip Extension: 4/5 Left Hip ABduction: 4+/5 Left Hip ADduction: 4+/5 Left Knee Flexion: 4+/5 Left Knee Extension: 5/5 Left Ankle Dorsiflexion: 4+/5 Left Ankle Plantar Flexion: 4+/5  Care Tool Care Tool Bed Mobility Roll left and right activity   Roll left and right assist level: Contact Guard/Touching assist    Sit to lying activity   Sit to lying assist level: Contact Guard/Touching assist    Lying to sitting on side of bed activity   Lying to sitting on side of bed assist level: the ability to move from lying on the  back to sitting on the side of the bed with no back support.: Contact Guard/Touching assist     Care Tool Transfers Sit to stand transfer   Sit to stand assist level: Minimal Assistance - Patient > 75%    Chair/bed transfer   Chair/bed transfer assist level: Minimal Assistance - Patient > 75%    Car transfer   Car transfer assist level: Minimal Assistance - Patient > 75%      Care Tool  Locomotion Ambulation   Assist level: Minimal Assistance - Patient > 75%   Max distance: 60 ft  Walk 10 feet activity   Assist level: Minimal Assistance - Patient > 75% Assistive device: Walker-rolling   Walk 50 feet with 2 turns activity   Assist level: Minimal Assistance - Patient > 75% Assistive device: Walker-rolling  Walk 150 feet activity Walk 150 feet activity did not occur: Safety/medical concerns      Walk 10 feet on uneven surfaces activity Walk 10 feet on uneven surfaces activity did not occur: Safety/medical concerns      Stairs Stair activity did not occur: Safety/medical concerns        Walk up/down 1 step activity Walk up/down 1 step or curb (drop down) activity did not occur: Safety/medical concerns      Walk up/down 4 steps activity Walk up/down 4 steps activity did not occur: Safety/medical concerns      Walk up/down 12 steps activity Walk up/down 12 steps activity did not occur: Safety/medical concerns      Pick up small objects from floor Pick up small object from the floor (from standing position) activity did not occur: Safety/medical concerns      Wheelchair Is the patient using a wheelchair?: Yes     Wheelchair assist level: Supervision/Verbal cueing Max wheelchair distance: 80 ft  Wheel 50 feet with 2 turns activity   Assist Level: Supervision/Verbal cueing  Wheel 150 feet activity   Assist Level: Moderate Assistance - Patient 50 - 74%    Refer to Care Plan for Long Term Goals  SHORT TERM GOAL WEEK 1 PT Short Term Goal 1 (Week 1): STG = LTG d/t ELOS  Recommendations for other services: None   Skilled Therapeutic Intervention Mobility Bed Mobility Bed Mobility: Rolling Right;Right Sidelying to Sit Rolling Right: Contact Guard/Touching assist Right Sidelying to Sit: Contact Guard/Touching assist Transfers Transfers: Sit to Stand;Stand to Sit;Stand Pivot Transfers Sit to Stand: Moderate Assistance - Patient 50-74% Stand to Sit:  Moderate Assistance - Patient 50-74% Stand Pivot Transfers: Minimal Assistance - Patient > 75% Stand Pivot Transfer Details: Tactile cues for initiation;Verbal cues for sequencing;Tactile cues for placement;Tactile cues for weight shifting;Verbal cues for safe use of DME/AE;Verbal cues for technique;Verbal cues for precautions/safety;Visual cues for safe use of DME/AE Transfer (Assistive device): Rolling walker Locomotion  Gait Ambulation: Yes Gait Assistance: Minimal Assistance - Patient > 75% Gait Distance (Feet): 60 Feet Assistive device: Rolling walker Gait Assistance Details: Verbal cues for technique;Verbal cues for precautions/safety;Verbal cues for safe use of DME/AE;Tactile cues for posture Gait Gait: Yes Gait Pattern: Impaired Gait Pattern: Step-through pattern;Decreased stride length;Right flexed knee in stance;Left flexed knee in stance Gait velocity: decreased Stairs / Additional Locomotion Stairs: No Corporate treasurer: Yes Wheelchair Assistance: Doctor, general practice: Both upper extremities Wheelchair Parts Management: Needs assistance Distance: 85 feet  Skilled Intervention: PT Evaluation completed; see above for results. PT educated patient in roles of PT vs OT, PT POC, rehab potential, rehab goals, and discharge recommendations along  with recommendation for follow-up rehabilitation services. Individual treatment initiated:  Patient supine in bed and asleep upon PT arrival. Pt does require time and effort to fully wake then become alert and agreeable to PT session.   No pain complaint at start of session.  Therapeutic Activity: Bed Mobility: Patient performed supine > sit with vc for initiating LE and then completes to upright seated position on EOB with light CGA.  Transfers: Patient performed sit <> stand transfers throughout session with heavy MinA initially and improves to MinA. When allowed to attempt with no  assist pt tending to push from BUE on armrests and push hips forward without ability to weight shift forward and returning to sit. During NMR, guided in more posterior foot positioning, forward seat positioning and bend at hips to weight shift forward over feet. Pt tending to move R foot forward during transfer with minimal weight shift forward and continuing to require MinA. Will require continued training in technique to improve.   Car transfer performed with MinA for power up and CGA/ MinA to complete pivot turn. Requires vc to complete by pulling BLE into footwell of car. But then able to with supervision. Exits car with CGA and rises to stand easily to RW from elevated seat height.   Gait Training:  Patient ambulated 60 ft using RW with MinA for balance. Demonstrated forward flexed posture and inability to maintain good proximity to walker with BUE outstretched. Runs RW into tray table to L side and unable to self correct. Also requires vc to hold head with level gaze in order to navigate large obstacles.  Wheelchair Mobility:  Patient propelled wheelchair 48' x1/ 76' x1 with CGA initially and then able to complete distances with supervision. Provided vc/tc for technique initially but then able to demonstrate good technique for turns and good awareness of clearance on both sides when navigating turns and doorways.   Patient supine in bed  at end of session with brakes locked, bed alarm set, and all needs within reach. Wife in room providing supervision.     Discharge Criteria: Patient will be discharged from PT if patient refuses treatment 3 consecutive times without medical reason, if treatment goals not met, if there is a change in medical status, if patient makes no progress towards goals or if patient is discharged from hospital.  The above assessment, treatment plan, treatment alternatives and goals were discussed and mutually agreed upon: by patient and by family  Mliss DELENA Milliner PT, DPT,  CSRS 08/05/2024, 2:11 PM

## 2024-08-05 NOTE — Progress Notes (Signed)
 Physical Therapy Session Note  Patient Details  Name: Wayne Molina MRN: [id number] Date of Birth: 03/31/1956  Today's Date: 08/05/2024 PT Individual Time: 1355-1445 PT Individual Time Calculation (min): 50 min   Short Term Goals: Week 1:  PT Short Term Goal 1 (Week 1): STG = LTG d/t ELOS  Skilled Therapeutic Interventions/Progress Updates: Patient semi-reclined in bed with wife present on entrance to room. Patient alert and agreeable to PT session.   Patient reported no pain and need to have BM. Pt wife expressed concern about pt's decreased sound when speaking and some confusion that increases at night. Pt also presented with max cuing throughout session as noted below. PTA to communicate with rehab team to see if SLP evaluation would benefit.  Therapeutic Activity: Bed Mobility: Pt performed supine<sit on EOB (HOB elevated) with CGA/minA. VC required for hand placement to assist with anterior scoot. Transfers: Pt performed sit<>stand transfers throughout session with minA. Provided VC for anterior scoot, anterior weight shift, and hand placement (max cuing required due to pt's decreased recall of information).  Pt ambulated short distance from EOB<toilet in room in RW with CGA and light minA to navigate slight incline to enter room. Pt required maxA to donn/doff personal lower body clothing. Pt missed skilled PT minutes due to increased time required to have BM. Pt cued to use wet wipes to perform posterior pericare. Pt attempted to clean by reaching anteriorly vs leaning to side to wipe (pt reported not doing it that way before and didn't know why he did). Pt required maxA to clean posterior peri-area. Pt sitting in WC following and washed hands with no issue. Pt transported to day room gym in [facility] and ambulated roughly 25' x 1 (seated rest to donn personal shoes) and then 54' x 1 with rest break required. Pt required max cuing to maintain safe proximity to RW and to increase B step  clearance/length (3lb ankle weights donned B LE's with added cuing to increase B hip flexion to front of RW bar - weights added after roughly 20' of total 54'). Pt transported back to room dependently in [facility]  Patient sitting in WC at end of session with brakes locked, family present, and all needs within reach.  Therapy Documentation Precautions:  Precautions Precautions: Fall Recall of Precautions/Restrictions: Impaired Precaution/Restrictions Comments: recent R crani Restrictions Weight Bearing Restrictions Per Provider Order: No  Therapy/Group: Individual Therapy  Tzion Wedel PTA 08/05/2024, 3:44 PM

## 2024-08-05 NOTE — Evaluation (Signed)
 Occupational Therapy Assessment and Plan  Patient Details  Name: Wayne Molina MRN: [id number] Date of Birth: Nov 13, 1955  OT Diagnosis: acute pain, cognitive deficits, disturbance of vision, and muscle weakness (generalized) Rehab Potential: Rehab Potential (ACUTE ONLY): Good ELOS: 5-7 days   Today's Date: 08/05/2024 OT Individual Time: 8954-8854 OT Individual Time Calculation (min): 60 min     Hospital Problem: Principal Problem:   GBM (glioblastoma multiforme) (HCC)   Past Medical History:  Past Medical History:  Diagnosis Date   Anxiety    Arthritis    fingers   Chronic atrial fibrillation (HCC)    Depression    Family history of breast cancer    Family history of cancer of male genital organ    Family history of gene mutation    BRIP1   Family history of malignant neoplasm of gastrointestinal tract    Hyperlipidemia    Hypertension    Personal history of colonic polyps 11/12/2006   Sleep apnea    Past Surgical History:  Past Surgical History:  Procedure Laterality Date   2-D echocardiogram  07/22/2010   Ejection fraction greater than 55%. Left atrium moderately dilated. Right atrium moderately dilated. 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 Mild to Moderate MR. Mild to moderate TR.   APPLICATION OF CRANIAL NAVIGATION Right 07/17/2024   Procedure: COMPUTER-ASSISTED NAVIGATION, FOR CRANIAL PROCEDURE;  Surgeon: Wayne Dino HERO, MD;  Location: [facility] OR;  Service: Neurosurgery;  Laterality: Right;  CRANIAL NAVIGATION   ATRIAL ABLATION SURGERY  2004   EP IMPLANTABLE DEVICE N/A 02/11/2016   Procedure: Loop Recorder Insertion;  Surgeon: Wayne Balding, MD;  Location: [facility] INVASIVE CV LAB;  Service: Cardiovascular;  Laterality: N/A;   Exercise Myoview stress test  07/08/2000   Nonischemic low-risk.   HIP FRACTURE SURGERY  1970s   IR GENERIC HISTORICAL  01/21/2017   IR RADIOLOGIST EVAL & MGMT 01/21/2017 [facility]   STERIOTACTIC STIMULATOR INSERTION Right 07/17/2024   Procedure: OPEN  CRANIOTOMY FOR BIOPSY;  Surgeon: Wayne Dino HERO, MD;  Location: [facility] OR;  Service: Neurosurgery;  Laterality: Right;  RIGHT STEREOTACTIC BRAIN BIOPSY    Assessment & Plan Clinical Impression:  Wayne Molina is a 68 year old right-handed male with history significant for atrial fibrillation status post loop recorder 2017 per Wayne Molina maintained on chronic Eliquis , atypical variant hypertrophic cardiomyopathy depression/anxiety, hypertension, hyperlipidemia, colonic polyps, OSA/tobacco use. Per chart review patient lives with spouse. 1 level home with level entry. 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 Reports 1 fall when stepped off a deck a few weeks ago no loss of consciousness no other falls reported. Presented 07/07/2024 with acute onset of left-sided weakness/slurred speech and hemianopsia while at work. Initial CT concerning for right MCA CVA however patient had witnessed seizure in the ER repeat CT was concerning for mass. EEG showed evidence of L lepto Jenise and cortical dysfunction arising from right hemisphere likely due to underlying structural abnormality. Additionally there was severe diffuse encephalopathy. No seizure was seen throughout the recording. Patient was loaded with Keppra  for seizure prophylaxis and subsequent MRI showed infiltrating tumor concerning for glioblastoma versus lymphoma. Admission chemistries unremarkable except glucose 125. Hospital course patient did require intubation for airway protection 8/29 - 9/5 for Enterobacter in the setting of progressive encephalopathy. Patient underwent right craniotomy for biopsy of brain tumor 07/17/2024 per Wayne Molina and pathology consistent with glioblastoma multiforme. Medical oncology Wayne Molina consulted and no current formal plan for treatment to start for 2-3 weeks and latest cranial  CT scan 07/26/2024 showed no hydrocephalus without acute changes with similar vasogenic edema and mass effect..  Patient currently remains on Decadron  therapy as indicated. He had been cleared to resume chronic Eliquis . Venous Doppler studies 07/23/2024 did show a right gastrocnemius vein patient remained on chronic Eliquis  as indicated. He remains on Keppra  for seizure prophylaxis. Palliative care was consulted to establish goals of care. Leukocytosis 11,400-15,000 felt to be steroid-induced. He did experience bouts of urinary retention with Foley/voiding trial and maintained on Flomax . Patient with decreased nutritional storage initially with nasogastric tube for nutritional support currently on a dysphagia #3 thin liquid diet patient had refused gastrostomy tube placement for nutritional supplementation. Patient has had intermittent confusion and restlessness especially at nighttime maintained on Seroquel . Therapy evaluations completed due to patient decreased functional mobility cognitive deficits was admitted for a comprehensive rehab program.Patient transferred to CIR on 08/04/2024 .    Patient currently requires min with basic self-care skills secondary to muscle weakness, decreased visual perceptual skills and decreased visual motor skills, decreased initiation, decreased attention, decreased awareness, decreased problem solving, decreased safety awareness, decreased memory, and delayed processing, and decreased standing balance and decreased balance strategies.  Prior to hospitalization, patient could complete BADL/IADL with independent .  Patient will benefit from skilled intervention to decrease level of assist with basic self-care skills prior to discharge home with care partner.  Anticipate patient will require minimal physical assistance and follow up home health and follow up outpatient.  OT - End of Session Activity Tolerance: Decreased this session Endurance Deficit: Yes OT Assessment Rehab Potential (ACUTE ONLY): Good OT Barriers to Discharge: Pending chemo/radiation OT Patient demonstrates  impairments in the following area(s): Balance;Perception;Cognition;Endurance;Motor;Pain;Behavior;Safety;Vision OT Basic ADL's Functional Problem(s): Bathing;Dressing;Toileting OT Advanced ADL's Functional Problem(s): None OT Transfers Functional Problem(s): Toilet;Tub/Shower OT Additional Impairment(s): None OT Plan OT Intensity: Minimum of 1-2 x/day, 45 to 90 minutes OT Frequency: 5 out of 7 days OT Duration/Estimated Length of Stay: 5-7 days OT Treatment/Interventions: Balance/vestibular training;Discharge planning;Pain management;Self Care/advanced ADL retraining;Therapeutic Activities;UE/LE Coordination activities;Cognitive remediation/compensation;Disease mangement/prevention;Functional mobility training;Patient/family education;Skin care/wound managment;Therapeutic Exercise;Visual/perceptual remediation/compensation;DME/adaptive equipment instruction;Neuromuscular re-education;UE/LE Strength taining/ROM;Wheelchair propulsion/positioning OT Self Feeding Anticipated Outcome(s): Supervision OT Basic Self-Care Anticipated Outcome(s): Set up/ supervision OT Toileting Anticipated Outcome(s): Contact guard assist OT Bathroom Transfers Anticipated Outcome(s): Contact Guard Assist OT Recommendation Recommendations for Other Services: Speech consult Patient destination: Home Follow Up Recommendations: Home health OT Equipment Recommended: To be determined   OT Evaluation Precautions/Restrictions  Precautions Precautions: Fall Recall of Precautions/Restrictions: Impaired Precaution/Restrictions Comments: recent R crani Restrictions Weight Bearing Restrictions Per Provider Order: No General OT Amount of Missed Time: 15 Minutes PT Missed Treatment Reason: Other (Comment) (toileting) Vital Signs Therapy Vitals Pulse Rate: (!) 54 Resp: (!) 22 BP: 116/60 Patient Position (if appropriate): Sitting Oxygen Therapy SpO2: 99 % O2 Device: Room Air Pain Pain Assessment Pain Scale:  0-10 Pain Score: 0-No pain Home Living/Prior Functioning Home Living Family/patient expects to be discharged to:: Private residence Living Arrangements: Spouse/significant other Available Help at Discharge: Family, Available 24 hours/day Type of Home: House Home Access: Stairs to enter [employer] of Steps: 1 threshold step Entrance Stairs-Rails: None Home Layout: One level Bathroom Shower/Tub: Engineer, manufacturing systems: Standard  Lives With: Spouse IADL History Current License: Yes Mode of Transportation: Car Occupation: Full time employment Type of Occupation: Freight forwarder - [place] Prior Function Level of Independence: Independent with basic ADLs, Independent with gait  Able to Take Stairs?: Yes Driving: Yes Vocation: Full time employment Vocation Requirements: materials  management at [employer] Vision Baseline Vision/History: 0 No visual deficits Ability to See in Adequate Light: 1 Impaired Patient Visual Report: No change from baseline Vision Assessment?: Yes Eye Alignment: Impaired (comment) (L eye with medial orientation) Ocular Range of Motion: Impaired-to be further tested in functional context Alignment/Gaze Preference: Within Defined Limits Tracking/Visual Pursuits: Left eye does not track laterally Saccades: Impaired - to be further tested in functional context Convergence: Impaired - to be further tested in functional context Visual Fields: Impaired-to be further tested in functional context Diplopia Assessment: Disappears with one eye closed Depth Perception: Undershoots Additional Comments: benefits from cues to attend to L environment Perception  Perception: Impaired Perception-Other Comments: Cues to scan left with functional mobility Praxis Praxis: Impaired Praxis Impairment Details: Motor planning Cognition Cognition Overall Cognitive Status: Impaired/Different from baseline Arousal/Alertness: Lethargic Orientation Level:  Person;Place Memory: Impaired Memory Impairment: Retrieval deficit;Decreased short term memory Decreased Short Term Memory: Functional basic Attention: Sustained Sustained Attention: Impaired Sustained Attention Impairment: Functional basic Awareness: Impaired Awareness Impairment: Intellectual impairment Problem Solving: Impaired Problem Solving Impairment: Functional basic Executive Function: Reasoning;Decision Making;Organizing Reasoning: Impaired Organizing: Impaired Organizing Impairment: Functional basic Decision Making: Impaired Initiating: Impaired Initiating Impairment: Functional basic Safety/Judgment: Impaired Brief Interview for Mental Status (BIMS) Repetition of Three Words (First Attempt): 3 Temporal Orientation: Year: Correct Temporal Orientation: Month: Accurate within 5 days Temporal Orientation: Day: Incorrect Recall: Sock: No, could not recall Recall: Blue: Yes, no cue required Recall: Bed: Yes, after cueing (a piece of furniture) BIMS Summary Score: 11 Sensation Sensation Light Touch: Appears Intact Hot/Cold: Appears Intact Proprioception: Appears Intact Stereognosis: Not tested Coordination Gross Motor Movements are Fluid and Coordinated: No Fine Motor Movements are Fluid and Coordinated: Yes Motor  Motor Motor: Other (comment) Motor - Skilled Clinical Observations: Generalized weakness/ fatigue affecting gross and stabilizing motor control  Trunk/Postural Assessment  Cervical Assessment Cervical Assessment: Exceptions to [facility] (forward head, downward gaze) Thoracic Assessment Thoracic Assessment: Exceptions to [facility] (mild trunk flexion with rounded shoulders) Lumbar Assessment Lumbar Assessment: Exceptions to [facility] (mild posterior pelvic tilt in standing) Postural Control Postural Control: Deficits on evaluation Righting Reactions: delayed response  Balance Balance Balance Assessed: Yes Static Sitting Balance Static Sitting - Balance  Support: Right upper extremity supported;Left upper extremity supported;Feet supported Static Sitting - Level of Assistance: 5: Stand by assistance Dynamic Sitting Balance Dynamic Sitting - Balance Support: Right upper extremity supported;Left upper extremity supported;Feet supported Dynamic Sitting - Level of Assistance: 5: Stand by assistance Dynamic Sitting - Balance Activities: Forward lean/weight shifting Static Standing Balance Static Standing - Balance Support: Right upper extremity supported;Left upper extremity supported;During functional activity Static Standing - Level of Assistance: 4: Min assist Dynamic Standing Balance Dynamic Standing - Balance Support: Right upper extremity supported;Left upper extremity supported;During functional activity Dynamic Standing - Level of Assistance: 4: Min assist Dynamic Standing - Balance Activities: Forward lean/weight shifting;Lateral lean/weight shifting;Reaching for objects Extremity/Trunk Assessment RUE Assessment RUE Assessment: Within Functional Limits General Strength Comments: 4/5 LUE Assessment LUE Assessment: Within Functional Limits General Strength Comments: 4/5  Care Tool Care Tool Self Care Eating Eating activity did not occur: Refused      Oral Care  Oral care, brush teeth, clean dentures activity did not occur: Refused      Bathing   Body parts bathed by patient: Right arm;Left arm;Chest;Abdomen;Front perineal area;Buttocks;Right upper leg;Right lower leg;Left lower leg;Face     Assist Level: Minimal Assistance - Patient > 75%    Upper Body Dressing(including orthotics)   What is the  patient wearing?: Pull over shirt   Assist Level: Minimal Assistance - Patient > 75%    Lower Body Dressing (excluding footwear)   What is the patient wearing?: Underwear/pull up;Pants Assist for lower body dressing: Moderate Assistance - Patient 50 - 74%    Putting on/Taking off footwear   What is the patient wearing?: Ted  hose;Shoes Assist for footwear: Maximal Assistance - Patient 25 - 49%       Care Tool Toileting Toileting activity Toileting Activity did not occur (Clothing management and hygiene only): Refused       Care Tool Bed Mobility Roll left and right activity   Roll left and right assist level: Contact Guard/Touching assist    Sit to lying activity   Sit to lying assist level: Contact Guard/Touching assist    Lying to sitting on side of bed activity   Lying to sitting on side of bed assist level: the ability to move from lying on the back to sitting on the side of the bed with no back support.: Contact Guard/Touching assist     Care Tool Transfers Sit to stand transfer   Sit to stand assist level: Minimal Assistance - Patient > 75%    Chair/bed transfer   Chair/bed transfer assist level: Minimal Assistance - Patient > 75%     Toilet transfer  Min assist Assist Level: Minimal Assistance - Patient > 75%     Care Tool Cognition  Expression of Ideas and Wants Expression of Ideas and Wants: 3. Some difficulty - exhibits some difficulty with expressing needs and ideas (e.g, some words or finishing thoughts) or speech is not clear  Understanding Verbal and Non-Verbal Content Understanding Verbal and Non-Verbal Content: 3. Usually understands - understands most conversations, but misses some part/intent of message. Requires cues at times to understand   Memory/Recall Ability Memory/Recall Ability : Current season;That he or she is in a hospital/hospital unit   Refer to Care Plan for Long Term Goals  SHORT TERM GOAL WEEK 1 OT Short Term Goal 1 (Week 1): STG=LTG due to LOS  Recommendations for other services: Neuropsych   Skilled Therapeutic Intervention:   Patient received supine in bed - sleeping with wife at bedside.  Wife indicates patient did not sleep well last night.  Patient woke to gentle calling of his name and presented with dysconjugate gaze.  Left eye oriented more  medially.  Patient spontaneously compensating with one eye closed, describes diplopia.  Patient agreeable to shower and walked to bathroom with RW and min assist.  Patient needing cueing to logically sequence events of shower - eg. Removing shoes and socks prior to turning water  on.  Patient using BUE to wash and dress himself.  Patient reports pain in neck/ skull base - called nursing to see if patient could receive pain medication.  Patient indicated shower helped slightly with neck pain.  Patient's wife present this session and pleased to see him up and dressed.  Patient returned to bed at end of session - unable to tolerate second 75 min session this am.  Bed alarm engaged and call bell in reach - wife at bedside.   ADL ADL Eating: Unable to assess Grooming: Minimal assistance Where Assessed-Grooming: Chair Upper Body Bathing: Minimal assistance Where Assessed-Upper Body Bathing: Shower Lower Body Bathing: Minimal assistance Where Assessed-Lower Body Bathing: Shower Upper Body Dressing: Minimal assistance Where Assessed-Upper Body Dressing: Chair Lower Body Dressing: Moderate assistance Where Assessed-Lower Body Dressing: Chair Toileting: Unable to assess Toilet Transfer: Unable to  assess Tub/Shower Transfer: Unable to assess Tub/Shower Transfer Method: Unable to assess Film/video editor: Moderate assistance Film/video editor Method: Designer, industrial/product: Event organiser  Bed Mobility Bed Mobility: Rolling Right;Right Sidelying to Sit Rolling Right: Contact Guard/Touching assist Right Sidelying to Sit: Contact Guard/Touching assist Transfers Sit to Stand: Moderate Assistance - Patient 50-74% Stand to Sit: Moderate Assistance - Patient 50-74%   Discharge Criteria: Patient will be discharged from OT if patient refuses treatment 3 consecutive times without medical reason, if treatment goals not met, if there is a change in medical status, if  patient makes no progress towards goals or if patient is discharged from hospital.  The above assessment, treatment plan, treatment alternatives and goals were discussed and mutually agreed upon: by patient and by family  Candace Ramus M 08/05/2024, 4:03 PM

## 2024-08-05 NOTE — Plan of Care (Signed)
 Problem: RH Balance Goal: LTG Patient will maintain dynamic standing with ADLs (OT) Description: LTG:  Patient will maintain dynamic standing balance with assist during activities of daily living (OT)  Flowsheets (Taken 08/05/2024 1607) LTG: Pt will maintain dynamic standing balance during ADLs with: Independent with assistive device   Problem: Sit to Stand Goal: LTG:  Patient will perform sit to stand in prep for activites of daily living with assistance level (OT) Description: LTG:  Patient will perform sit to stand in prep for activites of daily living with assistance level (OT) Flowsheets (Taken 08/05/2024 1607) LTG: PT will perform sit to stand in prep for activites of daily living with assistance level: Independent with assistive device   Problem: RH Eating Goal: LTG Patient will perform eating w/assist, cues/equip (OT) Description: LTG: Patient will perform eating with assist, with/without cues using equipment (OT) Flowsheets (Taken 08/05/2024 1607) LTG: Pt will perform eating with assistance level of: Supervision/Verbal cueing   Problem: RH Grooming Goal: LTG Patient will perform grooming w/assist,cues/equip (OT) Description: LTG: Patient will perform grooming with assist, with/without cues using equipment (OT) Flowsheets (Taken 08/05/2024 1607) LTG: Pt will perform grooming with assistance level of: Supervision/Verbal cueing   Problem: RH Bathing Goal: LTG Patient will bathe all body parts with assist levels (OT) Description: LTG: Patient will bathe all body parts with assist levels (OT) Flowsheets (Taken 08/05/2024 1607) LTG: Pt will perform bathing with assistance level/cueing: Supervision/Verbal cueing   Problem: RH Dressing Goal: LTG Patient will perform upper body dressing (OT) Description: LTG Patient will perform upper body dressing with assist, with/without cues (OT). Flowsheets (Taken 08/05/2024 1607) LTG: Pt will perform upper body dressing with assistance level of:  Set up assist Goal: LTG Patient will perform lower body dressing w/assist (OT) Description: LTG: Patient will perform lower body dressing with assist, with/without cues in positioning using equipment (OT) Flowsheets (Taken 08/05/2024 1607) LTG: Pt will perform lower body dressing with assistance level of: Contact Guard/Touching assist   Problem: RH Toileting Goal: LTG Patient will perform toileting task (3/3 steps) with assistance level (OT) Description: LTG: Patient will perform toileting task (3/3 steps) with assistance level (OT)  Flowsheets (Taken 08/05/2024 1607) LTG: Pt will perform toileting task (3/3 steps) with assistance level: Contact Guard/Touching assist   Problem: RH Vision Goal: RH LTG Vision Consulting civil engineer) Flowsheets (Taken 08/05/2024 1607) LTG: Vision Goals: Patient will visually attend to needed ADL items left of midline with initial prompt   Problem: RH Toilet Transfers Goal: LTG Patient will perform toilet transfers w/assist (OT) Description: LTG: Patient will perform toilet transfers with assist, with/without cues using equipment (OT) Flowsheets (Taken 08/05/2024 1607) LTG: Pt will perform toilet transfers with assistance level of: Contact Guard/Touching assist   Problem: RH Tub/Shower Transfers Goal: LTG Patient will perform tub/shower transfers w/assist (OT) Description: LTG: Patient will perform tub/shower transfers with assist, with/without cues using equipment (OT) Flowsheets (Taken 08/05/2024 1607) LTG: Pt will perform tub/shower stall transfers with assistance level of: Minimal Assistance - Patient > 75%   Problem: RH Memory Goal: LTG Patient will demonstrate ability for day to day recall/carry over during activities of daily living with assistance level (OT) Description: LTG:  Patient will demonstrate ability for day to day recall/carry over during activities of daily living with assistance level (OT). Flowsheets (Taken 08/05/2024 1607) LTG:  Patient will  demonstrate ability for day to day recall/carry over during activities of daily living with assistance level (OT): Minimal Assistance - Patient > 75%   Problem: RH Attention Goal:  LTG Patient will demonstrate this level of attention during functional activites (OT) Description: LTG:  Patient will demonstrate this level of attention during functional activites  (OT) Flowsheets (Taken 08/05/2024 1607) Patient will demonstrate this level of attention during functional activites: Selective Patient will demonstrate above attention level in the following environment: Home LTG: Patient will demonstrate this level of attention during functional activites (OT): Minimal Assistance - Patient > 75%   Problem: RH Awareness Goal: LTG: Patient will demonstrate awareness during functional activites type of (OT) Description: LTG: Patient will demonstrate awareness during functional activites type of (OT) Flowsheets (Taken 08/05/2024 1607) Patient will demonstrate awareness during functional activites type of: Emergent LTG: Patient will demonstrate awareness during functional activites type of (OT): Minimal Assistance - Patient > 75%

## 2024-08-05 NOTE — Plan of Care (Signed)
  Problem: RH Balance Goal: LTG Patient will maintain dynamic standing balance (PT) Description: LTG:  Patient will maintain dynamic standing balance with assistance during mobility activities (PT) Flowsheets (Taken 08/05/2024 1217) LTG: Pt will maintain dynamic standing balance during mobility activities with:: Contact Guard/Touching assist   Problem: Sit to Stand Goal: LTG:  Patient will perform sit to stand with assistance level (PT) Description: LTG:  Patient will perform sit to stand with assistance level (PT) Flowsheets (Taken 08/05/2024 1417) LTG: PT will perform sit to stand in preparation for functional mobility with assistance level: Contact Guard/Touching assist   Problem: RH Bed Mobility Goal: LTG Patient will perform bed mobility with assist (PT) Description: LTG: Patient will perform bed mobility with assistance, with/without cues (PT). Flowsheets (Taken 08/05/2024 1417) LTG: Pt will perform bed mobility with assistance level of: Supervision/Verbal cueing   Problem: RH Bed to Chair Transfers Goal: LTG Patient will perform bed/chair transfers w/assist (PT) Description: LTG: Patient will perform bed to chair transfers with assistance (PT). Flowsheets (Taken 08/05/2024 1417) LTG: Pt will perform Bed to Chair Transfers with assistance level: Contact Guard/Touching assist   Problem: RH Car Transfers Goal: LTG Patient will perform car transfers with assist (PT) Description: LTG: Patient will perform car transfers with assistance (PT). Flowsheets (Taken 08/05/2024 1417) LTG: Pt will perform car transfers with assist:: Contact Guard/Touching assist   Problem: RH Furniture Transfers Goal: LTG Patient will perform furniture transfers w/assist (OT/PT) Description: LTG: Patient will perform furniture transfers  with assistance (OT/PT). Flowsheets (Taken 08/05/2024 1417) LTG: Pt will perform furniture transfers with assist:: Contact Guard/Touching assist   Problem: RH Ambulation Goal:  LTG Patient will ambulate in controlled environment (PT) Description: LTG: Patient will ambulate in a controlled environment, # of feet with assistance (PT). Flowsheets (Taken 08/05/2024 1417) LTG: Pt will ambulate in controlled environ  assist needed:: Contact Guard/Touching assist LTG: Ambulation distance in controlled environment: at least 125 ft using LRAD Goal: LTG Patient will ambulate in home environment (PT) Description: LTG: Patient will ambulate in home environment, # of feet with assistance (PT). Flowsheets (Taken 08/05/2024 1417) LTG: Pt will ambulate in home environ  assist needed:: Contact Guard/Touching assist LTG: Ambulation distance in home environment: up to 41ft per bout using LRAD   Problem: RH Wheelchair Mobility Goal: LTG Patient will propel w/c in controlled environment (PT) Description: LTG: Patient will propel wheelchair in controlled environment, # of feet with assist (PT) Flowsheets (Taken 08/05/2024 1417) LTG: Pt will propel w/c in controlled environ  assist needed:: Supervision/Verbal cueing LTG: Propel w/c distance in controlled environment: more than 200 ft   Problem: RH Stairs Goal: LTG Patient will ambulate up and down stairs w/assist (PT) Description: LTG: Patient will ambulate up and down # of stairs with assistance (PT) Flowsheets (Taken 08/05/2024 1417) LTG: Pt will ambulate up/down stairs assist needed:: Contact Guard/Touching assist LTG: Pt will  ambulate up and down number of stairs: at least 4 steps using BHR

## 2024-08-06 DIAGNOSIS — C719 Malignant neoplasm of brain, unspecified: Secondary | ICD-10-CM | POA: Diagnosis not present

## 2024-08-06 LAB — GLUCOSE, CAPILLARY
Glucose-Capillary: 138 mg/dL — ABNORMAL HIGH (ref 70–99)
Glucose-Capillary: 173 mg/dL — ABNORMAL HIGH (ref 70–99)
Glucose-Capillary: 188 mg/dL — ABNORMAL HIGH (ref 70–99)
Glucose-Capillary: 277 mg/dL — ABNORMAL HIGH (ref 70–99)

## 2024-08-06 NOTE — Plan of Care (Signed)
  Problem: RH Swallowing Goal: LTG Patient will consume least restrictive diet using compensatory strategies with assistance (SLP) Description: LTG:  Patient will consume least restrictive diet using compensatory strategies with assistance (SLP) Flowsheets (Taken 08/06/2024 1250) LTG: Pt Patient will consume least restrictive diet using compensatory strategies with assistance of (SLP): Minimal Assistance - Patient > 75%   Problem: RH Cognition - SLP Goal: RH LTG Patient will demonstrate orientation with cues Description:  LTG:  Patient will demonstrate orientation to person/place/time/situation with cues (SLP)   Flowsheets (Taken 08/06/2024 1250) LTG: Patient will demonstrate orientation using cueing (SLP): Minimal Assistance - Patient > 75%   Problem: RH Memory Goal: LTG Patient will use memory compensatory aids to (SLP) Description: LTG:  Patient will use memory compensatory aids to recall biographical/new, daily complex information with cues (SLP) Flowsheets (Taken 08/06/2024 1250) LTG: Patient will use memory compensatory aids to (SLP): Minimal Assistance - Patient > 75%   Problem: RH Attention Goal: LTG Patient will demonstrate this level of attention during functional activites (SLP) Description: LTG:  Patient will will demonstrate this level of attention during functional activites (SLP) Flowsheets (Taken 08/06/2024 1250) LTG: Patient will demonstrate this level of attention during cognitive/linguistic activities with assistance of (SLP): Minimal Assistance - Patient > 75%

## 2024-08-06 NOTE — Evaluation (Signed)
 Speech Language Pathology Assessment and Plan  Patient Details  Name: PELLEGRINO Molina MRN: [id number] Date of Birth: [birth year]  SLP Diagnosis: Voice disorder;Dysphagia;Cognitive Impairments  Rehab Potential: Good ELOS: 5-7 days    Today's Date: 08/06/2024 SLP Individual Time: 9074-8976 SLP Individual Time Calculation (min): 58 min   Hospital Problem: Principal Problem:   GBM (glioblastoma multiforme) (HCC)  Past Medical History:  Past Medical History:  Diagnosis Date   Anxiety    Arthritis    fingers   Chronic atrial fibrillation (HCC)    Depression    Family history of breast cancer    Family history of cancer of male genital organ    Family history of gene mutation    BRIP1   Family history of malignant neoplasm of gastrointestinal tract    Hyperlipidemia    Hypertension    Personal history of colonic polyps 11/12/2006   Sleep apnea    Past Surgical History:  Past Surgical History:  Procedure Laterality Date   2-D echocardiogram  07/22/2010   Ejection fraction greater than 55%. Left atrium moderately dilated. Right atrium moderately dilated. 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 Mild to Moderate MR. Mild to moderate TR.   APPLICATION OF CRANIAL NAVIGATION Right 07/17/2024   Procedure: COMPUTER-ASSISTED NAVIGATION, FOR CRANIAL PROCEDURE;  Surgeon: Rosslyn Dino HERO, MD;  Location: [facility] OR;  Service: Neurosurgery;  Laterality: Right;  CRANIAL NAVIGATION   ATRIAL ABLATION SURGERY  2004   EP IMPLANTABLE DEVICE N/A 02/11/2016   Procedure: Loop Recorder Insertion;  Surgeon: Jerel Balding, MD;  Location: [facility] INVASIVE CV LAB;  Service: Cardiovascular;  Laterality: N/A;   Exercise Myoview stress test  07/08/2000   Nonischemic low-risk.   HIP FRACTURE SURGERY  1970s   IR GENERIC HISTORICAL  01/21/2017   IR RADIOLOGIST EVAL & MGMT 01/21/2017 [facility]-INTERV RAD   STERIOTACTIC STIMULATOR INSERTION Right 07/17/2024   Procedure: OPEN CRANIOTOMY FOR BIOPSY;  Surgeon: Rosslyn Dino HERO, MD;  Location: [facility]  OR;  Service: Neurosurgery;  Laterality: Right;  RIGHT STEREOTACTIC BRAIN BIOPSY    Assessment / Plan / Recommendation Clinical Impression  Wayne Molina is a 68 y.o. man who presented to [facility] 8/28 with AMS. MRI large infiltrative tumor with heterogeneous enhancement tracking from the splenium of corpus callosum near midline into right temporal lobe. Course complicated by seizure; intubated 8/29-9/5. Underwent craniotomy and biopsy 9/8 with result of glioblastoma multiforme stage IV. Has cortrak; on dysphagia 3/thin liquids. PMHx significant for HTN, HLD, Afib on Eliquis , untreated OSA.    Skilled Therapeutic Interventions          Swallowing Pt with a known moderate oropharyngeal dysphagia and silent aspiration per MBSS completed on 07/28/24; however, he has been tolerating an oral diet since 07/18/24 without concerns for respiratory compromise or pulmonary infection. SLP observed pt consumes sips of liquids and bites of applesauce with whole meds with no overt or subtle s/s of aspiration. Pt is missing grapes and salads. Recommend continue current diet with plan for regular trial meal tray with SLP next week.  Speech/Communication: expressive and receptive language appears [facility] per informal observation. No apparent motor speech deficits. Concern for moderate-severe dysphonia that has not improved since extubation. Recommend ENT follow up post d/c from CIR to assess vocal cord movement. Pt has been completing IS and EMST exercises. SLP can continue to address; however, pt does need an ENT consult.   Cognition Pt presents with moderate cognitive deficits per results of SLUMS completed today. Score: 15/30. Cognitive deficits characterized by reduced  working memory, reduced attention, reduced simple problem solving, and reduced executive functions. We discussed that his cognition may decline further with treatment for glioblastoma and intensive cognitive therapy will be best following treatment. However,  recommend SLP services to address cognitive strategies and cognitive activities that pt can complete throughout treatment process to maximize independence and reduce caregiver burden.   Pt left sitting upright in bed with needs in reach and wife at bedside. Continue SLP PoC.     SLP Assessment  Patient will need skilled Speech Lanaguage Pathology Services during CIR admission    Recommendations  SLP Diet Recommendations: Thin;Dysphagia 3 (Mech soft) Liquid Administration via: Cup;Straw Medication Administration: Whole meds with puree Supervision: Patient able to self feed;Intermittent supervision to cue for compensatory strategies;Trained caregiver to feed patient Compensations: Minimize environmental distractions;Slow rate;Small sips/bites Postural Changes and/or Swallow Maneuvers: Seated upright 90 degrees;Upright 30-60 min after meal Oral Care Recommendations: Oral care BID Patient destination: Home Follow up Recommendations: Home Health SLP;Outpatient SLP Equipment Recommended: None recommended by SLP    SLP Frequency 3 to 5 out of 7 days   SLP Duration  SLP Intensity  SLP Treatment/Interventions 5-7 days  Minumum of 1-2 x/day, 30 to 90 minutes  Cognitive remediation/compensation;Dysphagia/aspiration precaution training;Patient/family education;Therapeutic Activities;Cueing hierarchy    Pain Pain Assessment Pain Scale: 0-10 Pain Score: 0-No pain  Prior Functioning Cognitive/Linguistic Baseline: Within functional limits Type of Home: House  Lives With: Spouse Available Help at Discharge: Family;Available 24 hours/day Vocation: Full time employment (at [employer])  SLP Evaluation Cognition Overall Cognitive Status: Impaired/Different from baseline Arousal/Alertness: Awake/alert Orientation Level: Oriented to person;Oriented to place;Oriented to situation Year: 2025 Month: September Day of Week: Incorrect Attention: Sustained Sustained Attention: Impaired Sustained  Attention Impairment: Functional basic Memory: Impaired Memory Impairment: Retrieval deficit;Decreased short term memory Decreased Short Term Memory: Functional basic Awareness: Impaired Awareness Impairment: Intellectual impairment Problem Solving: Impaired Problem Solving Impairment: Functional basic Executive Function: Reasoning;Decision Making;Organizing Reasoning: Impaired Organizing: Impaired Organizing Impairment: Functional basic  Comprehension Auditory Comprehension Overall Auditory Comprehension: Appears within functional limits for tasks assessed Expression Expression Primary Mode of Expression: Verbal Verbal Expression Overall Verbal Expression: Appears within functional limits for tasks assessed Oral Motor Oral Motor/Sensory Function Overall Oral Motor/Sensory Function: Within functional limits Motor Speech Overall Motor Speech: Appears within functional limits for tasks assessed Respiration: Within functional limits Resonance: Within functional limits Intelligibility: Intelligibility reduced Word: 75-100% accurate (due to voice) Phrase: 50-74% accurate Sentence: 50-74% accurate (due to voice) Conversation: 50-74% accurate Motor Planning: Within functional limits Motor Speech Errors: Not applicable Effective Techniques: Increased vocal intensity  Care Tool Care Tool Cognition Ability to hear (with hearing aid or hearing appliances if normally used Ability to hear (with hearing aid or hearing appliances if normally used): 0. Adequate - no difficulty in normal conservation, social interaction, listening to TV   Expression of Ideas and Wants Expression of Ideas and Wants: 3. Some difficulty - exhibits some difficulty with expressing needs and ideas (e.g, some words or finishing thoughts) or speech is not clear   Understanding Verbal and Non-Verbal Content Understanding Verbal and Non-Verbal Content: 3. Usually understands - understands most conversations, but  misses some part/intent of message. Requires cues at times to understand  Memory/Recall Ability Memory/Recall Ability : Current season;That he or she is in a hospital/hospital unit   PMSV Assessment  PMSV Trial Intelligibility: Intelligibility reduced Word: 75-100% accurate (due to voice) Phrase: 50-74% accurate Sentence: 50-74% accurate (due to voice) Conversation: 50-74% accurate  Bedside Swallowing Assessment General Date  of Onset: 07/18/24 Previous Swallow Assessment: MBSS 07/28/24 Diet Prior to this Study: Dysphagia 3 (mechanical soft);Thin liquids (Level 0) Temperature Spikes Noted: No Respiratory Status: Room air History of Recent Intubation: Yes Total duration of intubation (days): 8 days Date extubated: 07/14/24 Behavior/Cognition: Alert;Cooperative;Pleasant mood Oral Cavity - Dentition: Dentures, top Self-Feeding Abilities: Able to feed self Vision: Functional for self-feeding Patient Positioning: Upright in bed Baseline Vocal Quality: Breathy;Low vocal intensity (moderately dysphonic) Volitional Swallow: Able to elicit  Oral Care Assessment Oral Assessment  (WDL): Exceptions to WDL Lips: Symmetrical Teeth: Missing (Comment) Tongue: Pink;Moist Mucous Membrane(s): Moist;Pink Saliva: Moist, saliva free flowing Level of Consciousness: Alert Is patient on any of following O2 devices?: None of the above Nutritional status: Dysphagia Oral Assessment Risk : High Risk Ice Chips Ice chips: Not tested Thin Liquid Thin Liquid: Within functional limits Presentation: Straw Other Comments: known silent aspiration Nectar Thick Nectar Thick Liquid: Not tested Honey Thick Honey Thick Liquid: Not tested Puree Puree: Within functional limits Presentation: Spoon;Self Fed Solid Solid:  (declined due to recently completing meal tray) BSE Assessment Risk for Aspiration Impact on safety and function: Mild aspiration risk Other Related Risk Factors: History of  dysphagia  Short Term Goals: Week 1: SLP Short Term Goal 1 (Week 1): LTGs=STGs d/t ELOS  Refer to Care Plan for Long Term Goals  Recommendations for other services: None   Discharge Criteria: Patient will be discharged from SLP if patient refuses treatment 3 consecutive times without medical reason, if treatment goals not met, if there is a change in medical status, if patient makes no progress towards goals or if patient is discharged from hospital.  The above assessment, treatment plan, treatment alternatives and goals were discussed and mutually agreed upon: by patient and by family  Marsella Suman J Arieonna Medine 08/06/2024, 1:00 PM

## 2024-08-06 NOTE — Progress Notes (Addendum)
 PROGRESS NOTE   Subjective/Complaints: Patient's wife states he was told he could d/c Wednesday, encouraged discussion with primary team tomorrow C/o left sided neck pain, kpad ordered  ROS: +left sided neck pain   Objective:   No results found. No results for input(s): WBC, HGB, HCT, PLT in the last 72 hours. No results for input(s): NA, K, CL, CO2, GLUCOSE, BUN, CREATININE, CALCIUM  in the last 72 hours.  Intake/Output Summary (Last 24 hours) at 08/06/2024 1241 Last data filed at 08/06/2024 1030 Gross per 24 hour  Intake 573 ml  Output 1650 ml  Net -1077 ml     Wound 07/29/24 0900 Pressure Injury Sacrum Medial;Bilateral Stage 2 -  Partial thickness loss of dermis presenting as a shallow open injury with a red, pink wound bed without slough. (Active)     Wound 08/04/24 1545 Pressure Injury Sacrum Mid Stage 2 -  Partial thickness loss of dermis presenting as a shallow open injury with a red, pink wound bed without slough. (Active)    Physical Exam: Vital Signs Blood pressure 132/78, pulse 82, temperature 97.7 F (36.5 C), temperature source Oral, resp. rate 18, height 5' 8 (1.727 m), SpO2 100%. Gen: no distress, normal appearing HEENT: oral mucosa pink and moist, NCAT Cardio: Reg rate Chest: normal effort, normal rate of breathing Abd: soft, non-distended Ext: no edema GU: Not examined. +Foley, draining clear urine.  Skin: C/D/I. No apparent lesions. Red discoloration of bilateral toes without wounds, brisk capillary refill. R craniotomy with staples intact, sutures at distal ends, well approximated without drainage.  MSK:      No apparent deformity. Plantarflexion positioning of feet, can range to neutral.       Neurologic exam:  Cognition: AAO to person, place, time and event.  Language: Fluent, No substitutions or neoglisms. + Moderate dysarthria and hypophonia.  Memory: + Mild memory  deficits. + Attention deficits Insight: Fair insight into current condition.  Mood: Pleasant affect, appropriate mood.  Sensation: To light touch intact in BL UEs and LEs  Reflexes: 2+ in BL UE and LEs. Negative Hoffman's and babinski signs bilaterally.  CN: 2-12 grossly intact.  Coordination: No apparent tremors.  Mild left sided ataxia  on FTN  Spasticity: MAS 0 in all extremities.       Strength:                RUE: 4/5 SA, 5-/5 EF, 5-/5 EE, 4/5 WE, 4/5 FF, 4/5 FA                LUE:  4/5 SA, 4/5 EF, 4/5 EE, 4-/5 WE, 4-/5 FF, 4-/5 FA                RLE: 4/5 HF, 5/5 KE, 5/5  DF, 5/5  EHL, 5/5  PF                 LLE:  4/5 HF, 5/5 KE, 5/5  DF, 5/5  EHL, 5/5  PF      Assessment/Plan: 1. Functional deficits which require 3+ hours per day of interdisciplinary therapy in a comprehensive inpatient rehab setting. Physiatrist is providing close team supervision and 24 hour management of  active medical problems listed below. Physiatrist and rehab team continue to assess barriers to discharge/monitor patient progress toward functional and medical goals  Care Tool:  Bathing    Body parts bathed by patient: Right arm, Left arm, Chest, Abdomen, Front perineal area, Buttocks, Right upper leg, Right lower leg, Left lower leg, Face         Bathing assist Assist Level: Minimal Assistance - Patient > 75%     Upper Body Dressing/Undressing Upper body dressing   What is the patient wearing?: Pull over shirt    Upper body assist Assist Level: Minimal Assistance - Patient > 75%    Lower Body Dressing/Undressing Lower body dressing      What is the patient wearing?: Underwear/pull up, Pants     Lower body assist Assist for lower body dressing: Moderate Assistance - Patient 50 - 74%     Toileting Toileting Toileting Activity did not occur Press photographer and hygiene only): Refused  Toileting assist       Transfers Chair/bed transfer  Transfers assist     Chair/bed transfer  assist level: Minimal Assistance - Patient > 75%     Locomotion Ambulation   Ambulation assist      Assist level: Minimal Assistance - Patient > 75%   Max distance: 60 ft   Walk 10 feet activity   Assist     Assist level: Minimal Assistance - Patient > 75% Assistive device: Walker-rolling   Walk 50 feet activity   Assist    Assist level: Minimal Assistance - Patient > 75% Assistive device: Walker-rolling    Walk 150 feet activity   Assist Walk 150 feet activity did not occur: Safety/medical concerns         Walk 10 feet on uneven surface  activity   Assist Walk 10 feet on uneven surfaces activity did not occur: Safety/medical concerns         Wheelchair     Assist Is the patient using a wheelchair?: Yes      Wheelchair assist level: Supervision/Verbal cueing Max wheelchair distance: 80 ft    Wheelchair 50 feet with 2 turns activity    Assist        Assist Level: Supervision/Verbal cueing   Wheelchair 150 feet activity     Assist      Assist Level: Moderate Assistance - Patient 50 - 74%   Blood pressure 132/78, pulse 82, temperature 97.7 F (36.5 C), temperature source Oral, resp. rate 18, height 5' 8 (1.727 m), SpO2 100%.   Medical Problem List and Plan: 1. Functional deficits secondary to GBM.  Status post right craniotomy and biopsy 07/17/2024 per Dr.Janjua.  Continue Decadron  therapy follow-up outpatient medical oncology Dr. [name]             -patient may  shower             -ELOS/Goals: 5-7 days, Min A PT/OT, SPV SLP -goal is to optimize functional status for upcoming cancer treatment therapies.  Tentative goal to discharge to appointment with Dr. [name] 10-2             - Stable for admission to inpatient rehab   Discussed with wife that she was told patient can d/c Wednesday, encouraged discussion with primary team tomorrow  2.  Antithrombotics: -DVT/anticoagulation: Right gastrocnemius DVT  Pharmaceutical:  Eliquis  chronic for history of atrial fibrillation             -antiplatelet therapy: N/A 3. Left sided neck pain: kpad  ordered. Neurontin  100 mg 3 times daily, oxycodone  as needed 4. Mood/Behavior/Sleep: Melatonin 3 mg nightly.  Provide emotional support             -antipsychotic agents: Seroquel  25 mg daily as well as 12.5 mg nightly as needed agitation 5. Neuropsych/cognition: This patient is not capable of making decisions on his own behalf. 6. Skin/Wound Care: Routine skin checks 7. Fluids/Electrolytes/Nutrition: Routine ins and outs with follow-up chemistries 8.  Seizure prophylaxis.  Keppra  1000 mg twice daily 9.  Urinary retention/neurogenic bladder.  Flomax  0.4 mg daily.  D/c foley  10.  Hyperlipidemia.  Lipitor 11.  Hypertension/hypertrophic cardiomyopathy.  Coreg  12.5 mg twice daily.  Monitor with increased mobility, currently hypotensive- consider discussing with cardiology whether lower dose of coreg  can be used  12.  OSA/tobacco use.  Check oxygen saturations every shift, RT consult placed for CPAP  13.  Atrial fibrillation.  Status post loop recorder 2017.  Followed by Dr.Croitoru.  Continue Eliquis .  Cardiac rate controlled  14.  Hyperglycemia secondary to Decadron .  Hemoglobin A1c 6.0.  continue Lantus  8 units nightly.  Check blood sugars 3 times daily  15.  Decreased nutritional storage.  Dysphagia #3 thin liquid diet.  Follow-up dietary services  16.  Wound of gluteal cleft.  Suspect fungal etiology.  17.  Constipation/neurogenic bowel.  Last BM 9/28  LOS: 2 days A FACE TO FACE EVALUATION WAS PERFORMED  Sven SQUIBB Lauris Keepers 08/06/2024, 12:41 PM

## 2024-08-06 NOTE — Plan of Care (Signed)
  Problem: Consults Goal: RH BRAIN INJURY PATIENT EDUCATION Description: Description: See Patient Education module for eduction specifics Outcome: Progressing Goal: Skin Care Protocol Initiated - if Braden Score 18 or less Description: If consults are not indicated, leave blank or document N/A Outcome: Progressing Goal: Nutrition Consult-if indicated Outcome: Progressing   Problem: RH BOWEL ELIMINATION Goal: RH STG MANAGE BOWEL WITH ASSISTANCE Description: STG Manage Bowel with mod I Assistance. Outcome: Progressing Goal: RH STG MANAGE BOWEL W/MEDICATION W/ASSISTANCE Description: STG Manage Bowel with Medication with mod I Assistance. Outcome: Progressing   Problem: RH BLADDER ELIMINATION Goal: RH STG MANAGE BLADDER WITH ASSISTANCE Description: STG Manage Bladder With mod I  Assistance Outcome: Progressing Goal: RH STG MANAGE BLADDER WITH MEDICATION WITH ASSISTANCE Description: STG Manage Bladder With Medication With mod I Assistance. Outcome: Progressing Goal: RH STG MANAGE BLADDER WITH EQUIPMENT WITH ASSISTANCE Description: STG Manage Bladder With Equipment With mod I Assistance Outcome: Progressing   Problem: RH SKIN INTEGRITY Goal: RH STG SKIN FREE OF INFECTION/BREAKDOWN Description: Manage skin with min assist Outcome: Progressing Goal: RH STG MAINTAIN SKIN INTEGRITY WITH ASSISTANCE Description: STG Maintain Skin Integrity With min Assistance. Outcome: Progressing Goal: RH STG ABLE TO PERFORM INCISION/WOUND CARE W/ASSISTANCE Description: STG Able To Perform Incision/Wound Care With min Assistance. Outcome: Progressing   Problem: RH SAFETY Goal: RH STG ADHERE TO SAFETY PRECAUTIONS W/ASSISTANCE/DEVICE Description: STG Adhere to Safety Precautions With cues Assistance/Device. Outcome: Progressing   Problem: RH PAIN MANAGEMENT Goal: RH STG PAIN MANAGED AT OR BELOW PT'S PAIN GOAL Description: Pain < 4 with prns Outcome: Progressing   Problem: RH KNOWLEDGE DEFICIT  BRAIN INJURY Goal: RH STG INCREASE KNOWLEDGE OF SELF CARE AFTER BRAIN INJURY Description: Patient and spouse will be able to manage care at discharge using educational resources for medications, diet, nutrition, transfers, etc independently Outcome: Progressing Goal: RH STG INCREASE KNOWLEDGE OF DYSPHAGIA/FLUID INTAKE Description: Patient and spouse will be able to manage care at discharge using educational resources for medications, diet, nutrition, transfers, etc independently Outcome: Progressing   Problem: RH Vision Goal: RH LTG Vision (Specify) Outcome: Progressing

## 2024-08-07 DIAGNOSIS — C719 Malignant neoplasm of brain, unspecified: Secondary | ICD-10-CM | POA: Diagnosis not present

## 2024-08-07 DIAGNOSIS — R739 Hyperglycemia, unspecified: Secondary | ICD-10-CM

## 2024-08-07 DIAGNOSIS — T380X5A Adverse effect of glucocorticoids and synthetic analogues, initial encounter: Secondary | ICD-10-CM

## 2024-08-07 DIAGNOSIS — R269 Unspecified abnormalities of gait and mobility: Secondary | ICD-10-CM | POA: Diagnosis not present

## 2024-08-07 DIAGNOSIS — E876 Hypokalemia: Secondary | ICD-10-CM | POA: Diagnosis not present

## 2024-08-07 LAB — CBC WITH DIFFERENTIAL/PLATELET
Abs Immature Granulocytes: 0.12 K/uL — ABNORMAL HIGH (ref 0.00–0.07)
Basophils Absolute: 0 K/uL (ref 0.0–0.1)
Basophils Relative: 0 %
Eosinophils Absolute: 0 K/uL (ref 0.0–0.5)
Eosinophils Relative: 0 %
HCT: 42.6 % (ref 39.0–52.0)
Hemoglobin: 14.4 g/dL (ref 13.0–17.0)
Immature Granulocytes: 1 %
Lymphocytes Relative: 8 %
Lymphs Abs: 0.9 K/uL (ref 0.7–4.0)
MCH: 31.1 pg (ref 26.0–34.0)
MCHC: 33.8 g/dL (ref 30.0–36.0)
MCV: 92 fL (ref 80.0–100.0)
Monocytes Absolute: 0.6 K/uL (ref 0.1–1.0)
Monocytes Relative: 6 %
Neutro Abs: 9 K/uL — ABNORMAL HIGH (ref 1.7–7.7)
Neutrophils Relative %: 85 %
Platelets: 231 K/uL (ref 150–400)
RBC: 4.63 MIL/uL (ref 4.22–5.81)
RDW: 13.6 % (ref 11.5–15.5)
WBC: 10.6 K/uL — ABNORMAL HIGH (ref 4.0–10.5)
nRBC: 0 % (ref 0.0–0.2)

## 2024-08-07 LAB — GLUCOSE, CAPILLARY
Glucose-Capillary: 167 mg/dL — ABNORMAL HIGH (ref 70–99)
Glucose-Capillary: 165 mg/dL — ABNORMAL HIGH (ref 70–99)
Glucose-Capillary: 204 mg/dL — ABNORMAL HIGH (ref 70–99)

## 2024-08-07 LAB — COMPREHENSIVE METABOLIC PANEL WITH GFR
ALT: 58 U/L — ABNORMAL HIGH (ref 0–44)
AST: 29 U/L (ref 15–41)
Albumin: 2.4 g/dL — ABNORMAL LOW (ref 3.5–5.0)
Alkaline Phosphatase: 56 U/L (ref 38–126)
Anion gap: 9 (ref 5–15)
BUN: 33 mg/dL — ABNORMAL HIGH (ref 8–23)
CO2: 23 mmol/L (ref 22–32)
Calcium: 8.6 mg/dL — ABNORMAL LOW (ref 8.9–10.3)
Chloride: 103 mmol/L (ref 98–111)
Creatinine, Ser: 0.78 mg/dL (ref 0.61–1.24)
GFR, Estimated: >60 mL/min (ref >60)
Glucose, Bld: 160 mg/dL — ABNORMAL HIGH (ref 70–99)
Potassium: 5.6 mmol/L — ABNORMAL HIGH (ref 3.5–5.1)
Sodium: 135 mmol/L (ref 135–145)
Total Bilirubin: 0.8 mg/dL (ref 0.0–1.2)
Total Protein: 5.8 g/dL — ABNORMAL LOW (ref 6.5–8.1)

## 2024-08-07 MED ORDER — SODIUM ZIRCONIUM CYCLOSILICATE 5 G PO PACK
5.0000 g | PACK | Freq: Every day | ORAL | Status: AC
  Administered 2024-08-07: 5 g via ORAL
  Filled 2024-08-07: qty 1

## 2024-08-07 NOTE — Progress Notes (Signed)
 PROGRESS NOTE   Subjective/Complaints: Seen with PT Reviewed CBC , WBCs improving  Elevated K+ ROS: +left sided neck pain   Objective:   No results found. Recent Labs    08/07/24 0535  WBC 10.6*  HGB 14.4  HCT 42.6  PLT 231   Recent Labs    08/07/24 0535  NA 135  K 5.6*  CL 103  CO2 23  GLUCOSE 160*  BUN 33*  CREATININE 0.78  CALCIUM  8.6*    Intake/Output Summary (Last 24 hours) at 08/07/2024 0955 Last data filed at 08/07/2024 9166 Gross per 24 hour  Intake 1196 ml  Output 1100 ml  Net 96 ml     Wound 07/29/24 0900 Pressure Injury Sacrum Medial;Bilateral Stage 2 -  Partial thickness loss of dermis presenting as a shallow open injury with a red, pink wound bed without slough. (Active)     Wound 08/04/24 1545 Pressure Injury Sacrum Mid Stage 2 -  Partial thickness loss of dermis presenting as a shallow open injury with a red, pink wound bed without slough. (Active)    Physical Exam: Vital Signs Blood pressure (!) 152/98, pulse 66, temperature 97.6 F (36.4 C), temperature source Oral, resp. rate 17, height 5' 8 (1.727 m), SpO2 100%. Gen: no distress, normal appearing HEENT: severe vocal hoarseness , no stridor Cardio: Reg rate Chest: normal effort, normal rate of breathing Abd: soft, non-distended Ext: no edema GU: Not examined. +Foley, draining clear urine.  Skin: C/D/I. No apparent lesions. Red discoloration of bilateral toes without wounds, brisk capillary refill. R craniotomy with staples intact, sutures at distal ends, well approximated without drainage.  MSK:      No apparent deformity. Plantarflexion positioning of feet, can range to neutral.      Gait with RW CGA, needs minA to stand, dizziness but no LOB with amb  Neurologic exam:  Cognition: AAO to person, place, time and event.  Language: Fluent, No substitutions or neoglisms. + Moderate dysarthria and hypophonia.  Memory: + Mild memory  deficits. + Attention deficits Insight: Fair insight into current condition.  Mood: Pleasant affect, appropriate mood.  Sensation: To light touch intact in BL UEs and LEs  Reflexes: 2+ in BL UE and LEs. Negative Hoffman's and babinski signs bilaterally.  CN: 2-12 grossly intact.  Coordination: No apparent tremors.  Intact finger to thumb bilateral  No dysdiadochokinesis with sup /pron of forearms  Spasticity: MAS 0 in all extremities.       Strength:                RUE: 4/5 SA, 5-/5 EF, 5-/5 EE, 4/5 WE, 4/5 FF, 4/5 FA                LUE:  4/5 SA, 4/5 EF, 4/5 EE, 4-/5 WE, 4-/5 FF, 4-/5 FA                RLE: 4/5 HF, 5/5 KE, 5/5  DF, 5/5  EHL, 5/5  PF                 LLE:  4/5 HF, 5/5 KE, 5/5  DF, 5/5  EHL, 5/5  PF  Assessment/Plan: 1. Functional deficits which require 3+ hours per day of interdisciplinary therapy in a comprehensive inpatient rehab setting. Physiatrist is providing close team supervision and 24 hour management of active medical problems listed below. Physiatrist and rehab team continue to assess barriers to discharge/monitor patient progress toward functional and medical goals  Care Tool:  Bathing    Body parts bathed by patient: Right arm, Left arm, Chest, Abdomen, Front perineal area, Buttocks, Right upper leg, Right lower leg, Left lower leg, Face         Bathing assist Assist Level: Minimal Assistance - Patient > 75%     Upper Body Dressing/Undressing Upper body dressing   What is the patient wearing?: Pull over shirt    Upper body assist Assist Level: Minimal Assistance - Patient > 75%    Lower Body Dressing/Undressing Lower body dressing      What is the patient wearing?: Underwear/pull up, Pants     Lower body assist Assist for lower body dressing: Moderate Assistance - Patient 50 - 74%     Toileting Toileting Toileting Activity did not occur Press photographer and hygiene only): Refused  Toileting assist       Transfers Chair/bed  transfer  Transfers assist     Chair/bed transfer assist level: Minimal Assistance - Patient > 75%     Locomotion Ambulation   Ambulation assist      Assist level: Minimal Assistance - Patient > 75%   Max distance: 60 ft   Walk 10 feet activity   Assist     Assist level: Minimal Assistance - Patient > 75% Assistive device: Walker-rolling   Walk 50 feet activity   Assist    Assist level: Minimal Assistance - Patient > 75% Assistive device: Walker-rolling    Walk 150 feet activity   Assist Walk 150 feet activity did not occur: Safety/medical concerns         Walk 10 feet on uneven surface  activity   Assist Walk 10 feet on uneven surfaces activity did not occur: Safety/medical concerns         Wheelchair     Assist Is the patient using a wheelchair?: Yes      Wheelchair assist level: Supervision/Verbal cueing Max wheelchair distance: 80 ft    Wheelchair 50 feet with 2 turns activity    Assist        Assist Level: Supervision/Verbal cueing   Wheelchair 150 feet activity     Assist      Assist Level: Moderate Assistance - Patient 50 - 74%   Blood pressure (!) 152/98, pulse 66, temperature 97.6 F (36.4 C), temperature source Oral, resp. rate 17, height 5' 8 (1.727 m), SpO2 100%.   Medical Problem List and Plan: 1. 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  Status post right craniotomy and biopsy 07/17/2024 per Dr.Janjua.  Continue Decadron  therapy slow wean, follow-up outpatient medical oncology Dr. [name]- reached out to Dr V to coordinate, has 10/2 appt, may need to d/c at [facility] level if wife unable to provide minA physical.  Reportedly helped pt into bed today              -patient may  shower             -ELOS/Goals: 5-7 days, Min A PT/OT, SPV SLP -goal is to optimize functional status for upcoming cancer treatment therapies.  Tentative goal to discharge to appointment with Dr. [name] 10-2  Weaning Decadron  on 2mg  po q8H , plan to reduce to 2mg  q12H on 10/2  2.  Antithrombotics: -DVT/anticoagulation: Right gastrocnemius DVT  Pharmaceutical: Eliquis  chronic for history of atrial fibrillation             -antiplatelet therapy: N/A 3. Left sided neck pain: kpad ordered. Neurontin  100 mg 3 times daily, oxycodone  as needed 4. Mood/Behavior/Sleep: Melatonin 3 mg nightly.  Provide emotional support             -antipsychotic agents: Seroquel  25 mg daily as well as 12.5 mg nightly as needed agitation 5. Neuropsych/cognition: This patient is not capable of making decisions on his own behalf. 6. Skin/Wound Care: Routine skin checks 7. Fluids/Electrolytes/Nutrition: Routine ins and outs with follow-up chemistries 8.  Seizure prophylaxis.  Keppra  1000 mg twice daily 9.  Urinary retention/neurogenic bladder.  Flomax  0.4 mg daily.  D/c foley  10.  Hyperlipidemia.  Lipitor 11.  Hypertension/hypertrophic cardiomyopathy.  Coreg  12.5 mg twice daily.  Monitor with increased mobility, currently hypotensive- consider discussing with cardiology whether lower dose of coreg  can be used Vitals:   08/06/24 2109 08/07/24 0301  BP: 135/76 (!) 152/98  Pulse: 83 66  Resp:  17  Temp:  97.6 F (36.4 C)  SpO2:  100%   Some lability will monitor for pattern   12.  OSA/tobacco use.  Check oxygen saturations every shift, RT consult placed for CPAP  13.  Atrial fibrillation.  Status post loop recorder 2017.  Followed by Dr.Croitoru.  Continue Eliquis .  Cardiac rate controlled  14.  Hyperglycemia secondary to Decadron . Expect improvement on Decadron  wean  Hemoglobin A1c 6.0.  continue Lantus  8 units nightly.  Check blood sugars 3 times daily CBG (last 3)  Recent Labs    08/06/24 1648 08/06/24 2047 08/07/24 0555  GLUCAP 188* 277* 167*   Expect improvement with Decadron  taper   15.  Decreased nutritional storage.  Dysphagia #3 thin liquid diet.  Follow-up dietary services  16.  Wound of gluteal  cleft.  Suspect fungal etiology.  17.  Constipation/neurogenic bowel.  Last BM 9/28  LOS: 3 days A FACE TO FACE EVALUATION WAS PERFORMED  Prentice FORBES Compton 08/07/2024, 9:55 AM

## 2024-08-07 NOTE — Progress Notes (Signed)
 Patient ID: Wayne Molina, male   DOB: [birth year], 68 y.o.   MRN: [id number] have ordered wheelchair and 3 in 1 via [employer] which accepts his insurance. Wife to get rolling walker and tb bench on own. Wife hopeful can go home on 10/1 so can go the oncologist appointment on 10/2

## 2024-08-07 NOTE — Progress Notes (Signed)
 Occupational Therapy Session Note  Patient Details  Name: Wayne Molina MRN: [id number] Date of Birth: 01-05-1956  Today's Date: 08/07/2024 OT Individual Time: 1405-1502 OT Individual Time Calculation (min): 57 min    Short Term Goals: Week 1:  OT Short Term Goal 1 (Week 1): STG=LTG due to LOS  Skilled Therapeutic Interventions/Progress Updates:  Pt greeted supine in bed, pt agreeable to OT intervention.    Pts wife present during session.   Transfers/bed mobility/functional mobility:  Pt completed bed mobility with CGA. Pt completed all sit>stands with MIN A with RW. Pt completed functional ambulation in apt to recliner and flat HOB with RW and CGA- MINA. Did educate wife on potentially needing bed rail down the road to assist with decreasing caregiver burden.   Pt completed ambulatory transfer into bathroom to TTB with RW and CGA, MIN cues needed for Rw mgmt and technique. Wife reports they will get their own TTB.   Pt completed functional ambulation in hallway with RW and CGA with chair follow, ~ 30 ft. Decreased cadence and step length noted with mod cues needed for Rw proximity.   Education:  Pt had just received DME. Set- up manual w/c and provided education to wife on w/c parts and leg rest mgmt. Wife able to return demo managing w/c but would benefit from continued practice d/t short LOS. Educated wife on using BSC as needed and using bags in [facility] to decrease mess from toileting. Education provided on pressure relief strategies for every 30 mins if pt chooses to stay up in w/c at home as w/c cushion is just a basic cushion.    Ended session with pt supine in bed with all needs within reach and bed alarm activated.                    Therapy Documentation Precautions:  Precautions Precautions: Fall Recall of Precautions/Restrictions: Impaired Precaution/Restrictions Comments: recent R crani Restrictions Weight Bearing Restrictions Per Provider Order: No  Pain: No pain     Therapy/Group: Individual Therapy  Wayne Molina 08/07/2024, 3:14 PM

## 2024-08-07 NOTE — Progress Notes (Signed)
 Patient ID: Wayne Molina, male   DOB: Nov 19, 1955, 68 y.o.   MRN: [id number]     Diagnosis codes: C71.9, S32.009A, R56.9  Height:   5'9             Weight:   169 lbs         Patient suffers from Glioblastoma   which impairs their ability to perform daily activities like  ADL's   in the home.  A walker  will not resolve issue with performing activities of daily living.  A wheelchair will allow patient to safely perform daily activities.  Patient is not able to propel themselves in the home using a standard weight wheelchair due to issues with his glioblastoma diagnosis .  Patient can self propel in the lightweight wheelchair.

## 2024-08-07 NOTE — Progress Notes (Signed)
 Physical Therapy Session Note  Patient Details  Name: Wayne Molina MRN: [id number] Date of Birth: Jan 13, 1956  Today's Date: 08/07/2024 PT Individual Time: 1000-1115 PT Individual Time Calculation (min): 75 min   Short Term Goals: Week 1:  PT Short Term Goal 1 (Week 1): STG = LTG d/t ELOS  Skilled Therapeutic Interventions/Progress Updates:      Pt presents in bed with his spouse at the bedside. Pt has no reports of pain. Pt has a hoarse voice and is difficult to understand. Bed mobility completed with supervision with use of hospital bed features. Donned shoes with assist for time management.   Pt requests to use the bathroom before leaving his room. Sit<>Stand to RW with minA due to slight retropulsion - relies on back of legs/knees against the bed to aid in stability with transfer. Ambulated with CGA and RW from bed to toilet, cues for keeping body within walker frame and mindful turning to approach the toilet. Pt continent of bladder void, in the hat of toilet - charted and notified NT.   Pt transported in w/c to main gym for energy conservation.   Sit<>stand to RW from w/c height with CGA with cues for scooting forward and for split hand placement. Ambulates with CGA and RW ~41ft with cues only for upright posture and keeping proximity to walker frame.   Curb transfer training with 5 curb to simulate home entrance. Pt completed x4 curb transfers with minA and RW with mod to max instructional and step-by-step cues throughout. Pt had no knee buckling or LOB but reports moderate fatigue levels after each attempt.   Stair training completed using 6 steps and 2 hand rails. Pt completing with a step-to pattern for both ascent & descent. Needs minA for balance and powering to step up each step. Completed x4 stairs total, pt reporting significant fatigue after stair training.   Pt finished session on the [place]ust[place] on double rolling hills program - used BUE/BLE to challenge whole body  strengthening and endurance. Resistance varying from L2-L12 on hills. Completed x7 minutes total.   Pt returned to his room, pt requesting to lie down in bed to rest. Bed mobility completed with supervision. Family at the bedside who were updated on patient's therapy session.   Therapy Documentation Precautions:  Precautions Precautions: Fall Recall of Precautions/Restrictions: Impaired Precaution/Restrictions Comments: recent R crani Restrictions Weight Bearing Restrictions Per Provider Order: No General:      Therapy/Group: Individual Therapy  [name] 08/07/2024, 7:53 AM

## 2024-08-07 NOTE — Progress Notes (Signed)
 Inpatient Rehabilitation  Patient information reviewed and entered into eRehab system by [facility]. Karen Kays., CCC/SLP, PPS Coordinator.  Information including medical coding, functional ability and quality indicators will be reviewed and updated through discharge.

## 2024-08-07 NOTE — Progress Notes (Signed)
 08/07/24 1500  Spiritual Encounters  Type of Visit Follow up  Care provided to: Wayne Molina and family  Reason for visit Advance directives    Chaplain responded to consult request for ACD. Patient's wife was present in the room. The patient and the wife received the education on ACD. The patient stated that he would further discuss it with his wife, who is his designated health care agent.   Chaplain provided the Advance Directive packet as well as education on Advance Directives-documents an individual completes to communicate their health care directions in advance of a time when they may need them. Chaplain informed Wayne Molina the documents which may be completed here in the hospital are the Living Will and Health Care Power of [place].  Chaplain informed that the Health Care Power of [place] is a legal document in which an individual names another person, their Health Care Agent, to make health care decisions when the individual is not able to make them for themselves. The Health Care Agent's function can be temporary or permanent depending on the Wayne Molina's ability to make and communicate those decisions independently. Chaplain informed Wayne Molina in the absence of a Health Care Power of Attorney, the state of [place]  directs health care providers to look to the following individuals in the order listed: legal guardian; an attorney?in?fact under a general power of attorney (POA) if that POA includes the right to make health care decisions; a husband or wife; a majority of parents and adult children; a majority of adult brothers and sisters; or an individual who has an established relationship with you, who is acting in good faith and who can convey your wishes.  If none of these person are available or willing to make medical decisions on a patient's behalf, the law allows the patient's doctor to make decisions for them as long as another doctor agrees with those decisions.  Chaplain also informed the patient that the  Health Care agent has no decision-making authority over any affairs other than those related to his or her medical care.  The chaplain further educated the Wayne Molina that a Living Will is a legal document that allows an individual to state his or her desire not to receive life-prolonging measures in the event that they have a condition that is incurable and will result in their death in a short period of time; they are unconscious, and doctors are confident that they will not regain consciousness; and/or they have advanced dementia or other substantial and irreversible loss of mental function. The chaplain informed Wayne Molina that life-prolonging measures are medical treatments that would only serve to postpone death, including breathing machines, kidney dialysis, antibiotics, artificial nutrition and hydration (tube feeding), and similar forms of treatment and that if an individual is able to express their wishes, they may also make them known without the use of a Living Will, but in the event that an individual is not able to express their wishes themselves, a Living Will allows medical providers and the Wayne Molina's family and friends ensure that they are not making decisions on the Wayne Molina's behalf, but rather serving as the Wayne Molina's voice to convey decisions the Wayne Molina has already made.  The patient is aware that the decision to create an advance directive is theirs alone and they may chose not to complete the documents or may chose to complete one portion or both.  The patient was informed that they can revoke the documents at any time by striking through them and writing void or by  completing new documents, but that it is also advisable that the individual verbally notify interested parties that their wishes have changed.  They are also aware that the document must be signed in the presence of a notary public and two witnesses and that this can be done while the patient is still admitted to the hospital or after discharge in the  community. If they decide to complete Advance Directives after being discharged from the hospital, they have been advised to notify all interested parties and to provide those documents to their physicians and loved ones in addition to bringing them to the hospital in the event of another hospitalization.  The chaplain informed the Wayne Molina that if they desire to proceed with completing Advance Directive Documentation while they are still admitted, notary services are typically available at [facility] between the hours of 8:30 and 3:30 Monday-Friday.    When the patient is ready to have these documents completed, the patient should request that their nurse place a spiritual care consult and indicate that the patient is ready to have their advance directives notarized so that arrangements for witnesses and notary public can be made.  Please page spiritual care if the patient desires further education or has questions.   M.Kubra Susanna Kerry Resident [phone]

## 2024-08-07 NOTE — Progress Notes (Signed)
 Emeline Joesph BROCKS, DO  Physician Physical Medicine and Rehabilitation   Consult Note    Signed   Date of Service: 08/03/2024  9:34 AM  Related encounter: ED to Hosp-Admission (Discharged) from 07/06/2024 in [place] [facility]   Signed     Expand All Collapse All           Physical Medicine and Rehabilitation Consult Reason for Consult: Evaluate appropriateness for Inpatient Rehab Referring Physician: Dr. Orie       HPI: Wayne Molina is a 68 y.o. male with PMHx of  has a past medical history of Anxiety, Arthritis, Chronic atrial fibrillation (HCC), Depression, Family history of breast cancer, Family history of cancer of male genital organ, Family history of gene mutation, Family history of malignant neoplasm of gastrointestinal tract, Hyperlipidemia, Hypertension, Personal history of colonic polyps (11/12/2006), and Sleep apnea. . They were admitted to [facility] on 07/06/2024 for acute left-sided weakness and slurred speech while working in the [employer], brought up to the ER by 2 other employees.  Initial CT concerning for right MCA CVA, however patient had witnessed seizure in the ER and repeat CT was concerning for mass.  Patient was loaded with Keppra  and admitted, subsequent MRI showed infiltrating tumor concerning for glioblastoma versus lymphoma.  He was intubated from 8-29 to 9-5.  Had open craniotomy and biopsy with Dr. Rosslyn 9-8, pathology showed glioblastoma multiforme.  Per oncology, no treatments to start until 2 to 3 weeks after biopsy to allow for recovery; good prognosis for treatment given it is in the nondominant hemisphere and small in size.  He remains on Keppra  and steroids, and as shown functional improvement.  Hospital course was otherwise complicated by seizures, leukocytosis, thrombocytosis, poor p.o. intakes, bilateral leg pain, gluteal cleft wound, fecal incontinence/constipation, atrial fibrillation with RVR,  right gastrocnemius DVT, cognitive deficits/sundowning, hypertension, and urinary retention failing DC Foley trial.  Per oncology, size of tumor does not explain severity of current functional deficits.  PM&R was consulted to evaluate appropriateness for IPR admission.    Prior to admission, patient was independent without DME, working materials at [employer], and living with his wife in a 1 level house with a level entry.  He has 24/7 assistance available at discharge with family members.  Currently, he is min assist for transfers and ambulation with a rolling walker and min assist for grooming tasks, contact-guard for lower body dressing, and supervision for feeding.  He remains on a core track for supplementation and a dysphagia 3/thin liquid diet for nutrition, refuses PEG tube for nutritional supplementation.  Cognition remains altered but improving in terms of tangential responses and recall of staff.   Review of Systems  Constitutional:  Negative for chills and fever.  Eyes:  Negative for blurred vision and double vision.  Respiratory:  Negative for cough and shortness of breath.   Cardiovascular:  Negative for chest pain and palpitations.  Gastrointestinal:  Positive for abdominal pain and constipation. Negative for nausea and vomiting.  Genitourinary:        Retention  Musculoskeletal:  Positive for myalgias. Negative for neck pain.  Neurological:  Positive for speech change and focal weakness. Negative for dizziness, tingling, sensory change and headaches.  Psychiatric/Behavioral:         Confusion, worse at nighttime       Past Medical History:  Diagnosis Date   Anxiety     Arthritis      fingers  Chronic atrial fibrillation (HCC)     Depression     Family history of breast cancer     Family history of cancer of male genital organ     Family history of gene mutation      BRIP1   Family history of malignant neoplasm of gastrointestinal tract     Hyperlipidemia     Hypertension      Personal history of colonic polyps 11/12/2006   Sleep apnea               Past Surgical History:  Procedure Laterality Date   2-D echocardiogram   07/22/2010    Ejection fraction greater than 55%. Left atrium moderately dilated. Right atrium moderately dilated. 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 Mild to Moderate MR. Mild to moderate TR.   APPLICATION OF CRANIAL NAVIGATION Right 07/17/2024    Procedure: COMPUTER-ASSISTED NAVIGATION, FOR CRANIAL PROCEDURE;  Surgeon: Rosslyn Dino HERO, MD;  Location: [facility] OR;  Service: Neurosurgery;  Laterality: Right;  CRANIAL NAVIGATION   ATRIAL ABLATION SURGERY   2004   EP IMPLANTABLE DEVICE N/A 02/11/2016    Procedure: Loop Recorder Insertion;  Surgeon: Jerel Balding, MD;  Location: [facility] INVASIVE CV LAB;  Service: Cardiovascular;  Laterality: N/A;   Exercise Myoview stress test   07/08/2000    Nonischemic low-risk.   HIP FRACTURE SURGERY   1970s   IR GENERIC HISTORICAL   01/21/2017    IR RADIOLOGIST EVAL & MGMT 01/21/2017 [facility]-INTERV RAD   STERIOTACTIC STIMULATOR INSERTION Right 07/17/2024    Procedure: OPEN CRANIOTOMY FOR BIOPSY;  Surgeon: Rosslyn Dino HERO, MD;  Location: [facility] OR;  Service: Neurosurgery;  Laterality: Right;  RIGHT STEREOTACTIC BRAIN BIOPSY             Family History  Problem Relation Age of Onset   Dementia Mother     Heart disease Father     Diabetes Father     Other Sister          BRIP1 gene mutation   Kidney disease Brother     Colon cancer Maternal Uncle     Lung cancer Paternal Aunt          d. >50   Cancer Paternal Aunt          unknown type, d. >50   Cancer Cousin 17        gynecologic (paternal first cousin)   Colon cancer Nephew 27        arising in colon polyp   Cancer Niece 76        gynecologic; niece in her 37's   Breast cancer Niece 44   Other Niece          BRIP1 gene mutation   Cancer Paternal Great-grandmother          abdominal cancer (PGF's mother) great grandmother        Social History:  reports that he has  quit smoking. His smoking use included cigarettes. He has never used smokeless tobacco. He reports that he does not drink alcohol and does not use drugs. Allergies:  Allergies  No Known Allergies         Medications Prior to Admission  Medication Sig Dispense Refill   apixaban  (ELIQUIS ) 5 MG TABS tablet Take 1 tablet (5 mg total) by mouth 2 (two) times daily. 180 tablet 1   atorvastatin  (LIPITOR) 40 MG tablet Take 1 tablet (40 mg total) by mouth daily. 90 tablet 1   calcium  carbonate (OSCAL) 1500 (600 Ca)  MG TABS tablet Take by mouth 2 (two) times daily with a meal.       Cyanocobalamin (VITAMIN B-12 PO) Take 1 capsule by mouth daily.       losartan  (COZAAR ) 100 MG tablet Take 1 tablet (100 mg total) by mouth daily. 90 tablet 3   Magnesium Oxide 250 MG TABS Take 1 tablet by mouth daily in the afternoon.       Multiple Vitamin (MULTIVITAMIN ADULT PO) Take 1 tablet by mouth daily.       Potassium Gluconate 2 MEQ TABS Take 1 tablet by mouth daily in the afternoon.       sildenafil  (VIAGRA ) 50 MG tablet Take 2 tablets (100 mg total) by mouth daily as needed. 10 tablet 2   cyclobenzaprine  (FLEXERIL ) 5 MG tablet Take 1 tablet (5 mg total) by mouth 2 (two) times daily as needed for muscle spasms. (Patient not taking: Reported on 07/07/2024) 10 tablet 0   lidocaine  (LIDODERM ) 5 % Place 1 patch onto the skin daily. Apply to affected area on neck/shoulder and keep in place for 12 hours and remove for 12 hours (Patient not taking: Reported on 07/07/2024) 10 patch 0          Home: Home Living Family/patient expects to be discharged to:: Private residence Living Arrangements: Spouse/significant other Available Help at Discharge: Family, Available 24 hours/day Type of Home: House Home Access: Level entry Home Layout: One level Bathroom Shower/Tub: Engineer, manufacturing systems: Standard Home Equipment: None  Functional History: Prior Function Prior Level of Function : Independent/Modified  Independent, History of Falls (last six months) Mobility Comments: No AD, x1 fall when stepped off deck a few weeks ago, but no other falls ADLs Comments: Works night shift in Proofreader at [employer] Functional Status:  Mobility: Bed Mobility Overal bed mobility: Needs Assistance Bed Mobility: Supine to Sit, Sit to Supine Rolling: Min assist Sidelying to sit: HOB elevated, Used rails, Min assist Supine to sit: Supervision, HOB elevated, Used rails Sit to supine: Min assist, HOB elevated, Used rails Sit to sidelying: Mod assist, +2 for physical assistance, +2 for safety/equipment General bed mobility comments: Assist for LE mgmt during transitions Transfers Overall transfer level: Needs assistance Equipment used: Rolling walker (2 wheels) Transfers: Sit to/from Stand Sit to Stand: Min assist Bed to/from chair/wheelchair/BSC transfer type:: Step pivot Squat pivot transfers: Total assist, +2 safety/equipment Step pivot transfers: Min assist Transfer via Lift Equipment: Stedy General transfer comment: Cueing for hand placement and momentum swing to initiate powering up to RW. Ambulated to sink via RW with min A, small steps, cues to maintain proximity within RW. Responds well to big steps and with visual cues for foot placement to safely approach sink. Ambulation/Gait Ambulation/Gait assistance: Min assist, +2 safety/equipment Gait Distance (Feet): 40 Feet Assistive device: Rolling walker (2 wheels) Gait Pattern/deviations: Step-to pattern, Decreased step length - right, Trunk flexed, Decreased stride length General Gait Details: slow, short steps with low foot clearance. Cues for upright posture and increased step length and height with slight improvement. Improved step length with tactile cues. Pre-gait activities: static standing marches   ADL: ADL Overall ADL's : Needs assistance/impaired Eating/Feeding: Set up, Bed level Eating/Feeding Details (indicate cue type and reason): sipping  from straw in cup without spillage; provided meal and utensil setup on lunch tray Grooming: Wash/dry hands, Wash/dry face, Oral care, Minimal assistance, Cueing for sequencing, Standing Grooming Details (indicate cue type and reason): cued to terminate oral care task and for completed sequencing, assist  for stability Upper Body Bathing: Moderate assistance, Sitting Lower Body Bathing: Maximal assistance Upper Body Dressing : Minimal assistance, Cueing for sequencing, Bed level (HOB elevated) Upper Body Dressing Details (indicate cue type and reason): donned clean gown, cues for sequencing and assist to thoroughly thread LUE Lower Body Dressing: Contact guard assist, Sitting/lateral leans Lower Body Dressing Details (indicate cue type and reason): donned B socks via hip hike technique, inc time with LLE sock Toilet Transfer: Moderate assistance, +2 for physical assistance Toilet Transfer Details (indicate cue type and reason): stand step using RW to recliner Toileting- Clothing Manipulation and Hygiene: Total assistance Functional mobility during ADLs: Moderate assistance, +2 for physical assistance   Cognition: Cognition Overall Cognitive Status: Impaired/Different from baseline Arousal/Alertness: Lethargic Orientation Level: Oriented X4 Year: 2025 Month: September Day of Week: Incorrect Attention: Sustained Sustained Attention: Impaired Sustained Attention Impairment: Verbal basic, Functional basic Memory: Impaired Memory Impairment: Decreased recall of new information Awareness: Impaired Awareness Impairment: Intellectual impairment Problem Solving: Impaired Problem Solving Impairment: Verbal basic, Functional basic Executive Function: Initiating Initiating: Impaired Initiating Impairment: Verbal basic, Functional basic Safety/Judgment: Impaired Cognition Arousal: Alert Behavior During Therapy: Flat affect Overall Cognitive Status: Impaired/Different from baseline   Blood  pressure 125/89, pulse 77, temperature 98.6 F (37 C), temperature source Oral, resp. rate 16, height 5' 9 (1.753 m), weight 77 kg, SpO2 96%. Physical Exam Constitutional: No apparent distress. Appropriate appearance for age.  HENT: No JVD. Neck Supple. Trachea midline. Atraumatic, normocephalic. Eyes: PERRLA. EOMI. Visual fields grossly intact.  Cardiovascular: RRR, no murmurs/rub/gallops. No Edema. Peripheral pulses 2+  Respiratory: CTAB. No rales, rhonchi, or wheezing. On RA.  Abdomen: + bowel sounds, normoactive. Mild distention and moderate tenderness.  GU: Not examined. +Foley, draining clear urine.  Skin: C/D/I. No apparent lesions. Red discoloration of bilateral toes without wounds, brisk capillary refill. R craniotomy with staples intact, sutures at distal ends, well approximated without drainage.  MSK:      No apparent deformity. Plantarflexion positioning of feet, can range to neutral.       Neurologic exam:  Cognition: AAO to person, place, time and event.  Language: Fluent, No substitutions or neoglisms. + Moderate dysarthria and hypophonia. Names 3/3 objects correctly.  Memory: Recalls 1/3 objects at 5 minutes without cues, 3/3 with cues. + Attention deficits Insight: Fair insight into current condition.  Mood: Pleasant affect, appropriate mood.  Sensation: To light touch intact in BL UEs and LEs  Reflexes: 2+ in BL UE and LEs. Negative Hoffman's and babinski signs bilaterally.  CN: 2-12 grossly intact.  Coordination: No apparent tremors. No ataxia on FTN, HTS bilaterally.  Spasticity: MAS 0 in all extremities.       Strength:                RUE: 4/5 SA, 5-/5 EF, 5-/5 EE, 4/5 WE, 4/5 FF, 4/5 FA                LUE:  4/5 SA, 4/5 EF, 4/5 EE, 4-/5 WE, 4-/5 FF, 4-/5 FA                RLE: 4/5 HF, 5/5 KE, 5/5  DF, 5/5  EHL, 5/5  PF                 LLE:  4/5 HF, 5/5 KE, 5/5  DF, 5/5  EHL, 5/5  PF      Lab Results Last 24 Hours       Results for orders placed or performed  during the hospital encounter of 07/06/24 (from the past 24 hours)  Glucose, capillary     Status: Abnormal    Collection Time: 08/02/24 11:59 AM  Result Value Ref Range    Glucose-Capillary 206 (H) 70 - 99 mg/dL    Comment 1 Notify RN      Comment 2 Document in Chart    Glucose, capillary     Status: Abnormal    Collection Time: 08/02/24  3:47 PM  Result Value Ref Range    Glucose-Capillary 172 (H) 70 - 99 mg/dL    Comment 1 Notify RN      Comment 2 Document in Chart    Glucose, capillary     Status: Abnormal    Collection Time: 08/02/24  5:15 PM  Result Value Ref Range    Glucose-Capillary 148 (H) 70 - 99 mg/dL  Glucose, capillary     Status: Abnormal    Collection Time: 08/02/24  9:22 PM  Result Value Ref Range    Glucose-Capillary 211 (H) 70 - 99 mg/dL  Iron and TIBC     Status: Abnormal    Collection Time: 08/03/24  4:49 AM  Result Value Ref Range    Iron 136 45 - 182 ug/dL    TIBC 737 749 - 549 ug/dL    Saturation Ratios 52 (H) 17.9 - 39.5 %    UIBC 126 ug/dL  Ferritin     Status: Abnormal    Collection Time: 08/03/24  4:49 AM  Result Value Ref Range    Ferritin 648 (H) 24 - 336 ng/mL  CBC     Status: Abnormal    Collection Time: 08/03/24  4:49 AM  Result Value Ref Range    WBC 11.4 (H) 4.0 - 10.5 K/uL    RBC 4.70 4.22 - 5.81 MIL/uL    Hemoglobin 14.3 13.0 - 17.0 g/dL    HCT 57.2 60.9 - 47.9 %    MCV 90.9 80.0 - 100.0 fL    MCH 30.4 26.0 - 34.0 pg    MCHC 33.5 30.0 - 36.0 g/dL    RDW 87.1 88.4 - 84.4 %    Platelets 357 150 - 400 K/uL    nRBC 0.0 0.0 - 0.2 %  Magnesium     Status: None    Collection Time: 08/03/24  4:49 AM  Result Value Ref Range    Magnesium 2.0 1.7 - 2.4 mg/dL  Phosphorus     Status: None    Collection Time: 08/03/24  4:49 AM  Result Value Ref Range    Phosphorus 3.5 2.5 - 4.6 mg/dL  Basic metabolic panel     Status: Abnormal    Collection Time: 08/03/24  4:49 AM  Result Value Ref Range    Sodium 132 (L) 135 - 145 mmol/L    Potassium  5.1 3.5 - 5.1 mmol/L    Chloride 103 98 - 111 mmol/L    CO2 26 22 - 32 mmol/L    Glucose, Bld 130 (H) 70 - 99 mg/dL    BUN 27 (H) 8 - 23 mg/dL    Creatinine, Ser 9.02 0.61 - 1.24 mg/dL    Calcium  8.3 (L) 8.9 - 10.3 mg/dL    GFR, Estimated >39 >39 mL/min    Anion gap 3 (L) 5 - 15  Glucose, capillary     Status: Abnormal    Collection Time: 08/03/24  6:16 AM  Result Value Ref Range    Glucose-Capillary 116 (H) 70 - 99 mg/dL  Imaging Results (Last 48 hours)  No results found.     Assessment/Plan: Diagnosis: GBM s/p R craniotomy and biopsy Does the need for close, 24 hr/day medical supervision in concert with the patient's rehab needs make it unreasonable for this patient to be served in a less intensive setting? Yes Co-Morbidities requiring supervision/potential complications: L hemiparesis,  seizures, leukocytosis, thrombocytosis, poor p.o. intakes, bilateral leg pain, gluteal cleft wound, fecal incontinence/constipation, atrial fibrillation with RVR, right gastrocnemius DVT, cognitive deficits/sundowning, hypertension, and urinary retention failing DC Foley trial. Due to bladder management, bowel management, safety, skin/wound care, disease management, medication administration, and patient education, does the patient require 24 hr/day rehab nursing? Yes Does the patient require coordinated care of a physician, rehab nurse, therapy disciplines of PT, OT, and SLP to address physical and functional deficits in the context of the above medical diagnosis(es)? Yes Addressing deficits in the following areas: balance, endurance, locomotion, strength, transferring, bowel/bladder control, bathing, dressing, feeding, grooming, toileting, cognition, speech, language, swallowing, and psychosocial support Can the patient actively participate in an intensive therapy program of at least 3 hrs of therapy per day at least 5 days per week? Yes The potential for patient to make measurable gains while on  inpatient rehab is good Anticipated functional outcomes upon discharge from inpatient rehab are min assist  with PT, min assist with OT, supervision with SLP. Estimated rehab length of stay to reach the above functional goals is: 5-7 days Anticipated discharge destination: Home Overall Rehab/Functional Prognosis: good   POST ACUTE RECOMMENDATIONS: This patient's condition is appropriate for continued rehabilitative care in the following setting: CIR Patient has agreed to participate in recommended program. Yes Note that insurance prior authorization may be required for reimbursement for recommended care.   Comment: Per Oncology, patient will need to optimize functional recovery over a short period of ideally one week or less before initiation of treatment for aggressive intracranial cancer, as further timelines would result in worsening of disease an function prior to treatment. He is tolerating therapies well and is an ideal candidate for a short, intensive inpatient rehab program. He has 24/7 physical assistance available from his wife and excellent family support for discharge.        I have personally performed a face to face diagnostic evaluation of this patient. Additionally, I have examined the patient's medical record including any pertinent labs and radiographic images. If the physician assistant has documented in this note, I have reviewed and edited or otherwise concur with the physician assistant's documentation.   Thanks,   Joesph JAYSON Likes, DO 08/03/2024          Routing History

## 2024-08-07 NOTE — Progress Notes (Signed)
 Inpatient Rehabilitation Center Individual Statement of Services  Patient Name:  Wayne Molina  Date:  08/07/2024  Welcome to the Inpatient Rehabilitation Center.  Our goal is to provide you with an individualized program based on your diagnosis and situation, designed to meet your specific needs.  With this comprehensive rehabilitation program, you will be expected to participate in at least 3 hours of rehabilitation therapies Monday-Friday, with modified therapy programming on the weekends.  Your rehabilitation program will include the following services:  Physical Therapy (PT), Occupational Therapy (OT), Speech Therapy (ST), 24 hour per day rehabilitation nursing, Neuropsychology, Care Coordinator, Rehabilitation Medicine, Nutrition Services, and Pharmacy Services  Weekly team conferences will be held on Wednesday to discuss your progress.  Your Inpatient Rehabilitation Care Coordinator will talk with you frequently to get your input and to update you on team discussions.  Team conferences with you and your family in attendance may also be held.  Expected length of stay: 5-7 days  Overall anticipated outcome: supervision/CGA level  Depending on your progress and recovery, your program may change. Your Inpatient Rehabilitation Care Coordinator will coordinate services and will keep you informed of any changes. Your Inpatient Rehabilitation Care Coordinator's name and contact numbers are listed  below.  The following services may also be recommended but are not provided by the Inpatient Rehabilitation Center:  Driving Evaluations Home Health Rehabiltiation Services Outpatient Rehabilitation Services Vocational Rehabilitation   Arrangements will be made to provide these services after discharge if needed.  Arrangements include referral to agencies that provide these services.  Your insurance has been verified to be:  [employer] and [employer] Your primary doctor is:  Kathrine Melena  Pertinent information will be shared with your doctor and your insurance company.  Inpatient Rehabilitation Care Coordinator:  Rhoda Clement, KEN [phone] or ELIGAH BASQUES  Information discussed with and copy given to patient by: Clement Asberry MATSU, 08/07/2024, 10:53 AM

## 2024-08-07 NOTE — Progress Notes (Signed)
 Pt bladder scanned this morning for 511 ml at 0600. RN informed pt that it had been 6 hours since he last voided and that patients are usually In/Out cath for greater than 350. Pt stated, I'd rather not be cath, I will go after my cup of coffee and breakfast. RN educated pt and family about the importance of emptying his bladder and not letting large amounts of urine sit in the bladder. Pt reported that he would still like to try and go on his own since he went on his own earlier during the shift. Pt denies any pain or bladder discomfort at this time. Continue with plan of care.

## 2024-08-07 NOTE — Progress Notes (Signed)
 Inpatient Rehabilitation Care Coordinator Assessment and Plan Patient Details  Name: Wayne Molina MRN: [id number] Date of Birth: [birth year]  Today's Date: 08/07/2024  Hospital Problems: Principal Problem:   GBM (glioblastoma multiforme) (HCC)  Past Medical History:  Past Medical History:  Diagnosis Date   Anxiety    Arthritis    fingers   Chronic atrial fibrillation (HCC)    Depression    Family history of breast cancer    Family history of cancer of male genital organ    Family history of gene mutation    BRIP1   Family history of malignant neoplasm of gastrointestinal tract    Hyperlipidemia    Hypertension    Personal history of colonic polyps 11/12/2006   Sleep apnea    Past Surgical History:  Past Surgical History:  Procedure Laterality Date   2-D echocardiogram  07/22/2010   Ejection fraction greater than 55%. Left atrium moderately dilated. Right atrium moderately dilated. 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 Mild to Moderate MR. Mild to moderate TR.   APPLICATION OF CRANIAL NAVIGATION Right 07/17/2024   Procedure: COMPUTER-ASSISTED NAVIGATION, FOR CRANIAL PROCEDURE;  Surgeon: Rosslyn Dino HERO, MD;  Location: [facility] OR;  Service: Neurosurgery;  Laterality: Right;  CRANIAL NAVIGATION   ATRIAL ABLATION SURGERY  2004   EP IMPLANTABLE DEVICE N/A 02/11/2016   Procedure: Loop Recorder Insertion;  Surgeon: Jerel Balding, MD;  Location: [facility] INVASIVE CV LAB;  Service: Cardiovascular;  Laterality: N/A;   Exercise Myoview stress test  07/08/2000   Nonischemic low-risk.   HIP FRACTURE SURGERY  1970s   IR GENERIC HISTORICAL  01/21/2017   IR RADIOLOGIST EVAL & MGMT 01/21/2017 [facility]   STERIOTACTIC STIMULATOR INSERTION Right 07/17/2024   Procedure: OPEN CRANIOTOMY FOR BIOPSY;  Surgeon: Rosslyn Dino HERO, MD;  Location: [facility] OR;  Service: Neurosurgery;  Laterality: Right;  RIGHT STEREOTACTIC BRAIN BIOPSY   Social History:  reports that he has quit smoking. His smoking use included cigarettes.  He has never used smokeless tobacco. He reports that he does not drink alcohol and does not use drugs.  Family / Support Systems Marital Status: Married Patient Roles: Spouse Spouse/Significant Other: [name] [phone] Other Supports: Rodney-brother [phone] Anticipated Caregiver: [name] and brother Ability/Limitations of Caregiver: Wife is able to provide assist and will be here to learn his care in prepartion for home Caregiver Availability: 24/7 Family Dynamics: Wife is very involved and hands on with her husband. 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 to Work Plans: Unsure will probably retire now Marine scientist Issues: NA Guardian/Conservator: None-according to MD pt is not fully capable of making his own decisions will look toward his wife for any decisions to be made while here   Abuse/Neglect Abuse/Neglect Assessment Can Be Completed: Yes Physical Abuse: Denies Verbal Abuse: Denies Sexual Abuse: Denies Exploitation of patient/patient's resources: Denies Self-Neglect: Denies  Patient response to: Social Isolation - How often do you feel lonely or isolated from those around you?: Never  Emotional Status Pt's affect, behavior and adjustment status: Pt and wife are motivated to do what they can to assist pt and move forward to find  out his options for treatment. Pt was independent prior to admission and worked full time prior to his diagnosis. Recent Psychosocial Issues: other health issues- Psychiatric History: Hx-anxiety/depression takes medications  but is still processing all that has occurred and his diagnosis. Would benefit from seeing neuro-psych while here Substance Abuse History: NA  Patient / Family Perceptions, Expectations & Goals Pt/Family understanding of illness & functional limitations: Pt and wife can explain his surgery and diagnosis. Both are wanting to go to the appointment on 10/2 with Dr [name] to find out options and treatment. They do talk with the MD's involved and feel have a good understnading thus far about his plan Premorbid pt/family roles/activities: husband, sibling, employee, neighbor, freind, etc Anticipated changes in roles/activities/participation: resume Pt/family expectations/goals: Pt states:  I want to do well and get home.  Wife states:  I hope to learn his care and go the appointment on 10/2 with Dr. [name].  Community Resources [employer]: None Premorbid Home Care/DME Agencies: None Transportation available at discharge: wife Is the patient able to respond to transportation needs?: Yes In the past 12 months, has lack of transportation kept you from medical appointments or from getting medications?: No In the past 12 months, has lack of transportation kept you from meetings, work, or from getting things needed for daily living?: No Resource referrals recommended: Neuropsychology  Discharge Planning Living Arrangements: Spouse/significant other Support Systems: Spouse/significant other, Other relatives, Friends/neighbors Type of Residence: Private residence Community education officer Resources: Media planner (specify) ([employer]) Financial Resources: Employment, Family Support Financial Screen Referred: No Living Expenses: Own Money Management: Patient, Spouse Does the patient have any problems obtaining your medications?: No Home Management: wife Patient/Family Preliminary Plans: Return home with wife who is able to provide assist and is here presently to learn his care prior to discharge  home. Aware team feels 5-7 days ELOS and will need supervision/CGA level Care Coordinator Anticipated Follow Up Needs: HH/OP  Clinical Impression Pleasant gentleman who is motivated to recover and find out his options along with his wife who is present. He has an appointment 10/2 with Dr [name] and wants to be able to go to this, so would like to be discharge on 10/1  Raymonde Asberry MATSU 08/07/2024, 10:52 AM

## 2024-08-07 NOTE — Progress Notes (Signed)
 Occupational Therapy Session Note  Patient Details  Name: Wayne Molina MRN: [id number] Date of Birth: November 27, 1955  Today's Date: 08/07/2024 OT Individual Time: 9169-9054 OT Individual Time Calculation (min): 75 min    Short Term Goals: Week 1:  OT Short Term Goal 1 (Week 1): STG=LTG due to LOS  Skilled Therapeutic Interventions/Progress Updates:    Pt received in bed with his wife Camillo present.  Nurse tech reported pt c/o shortness of breath.   I checked his O2 sats during session and he was consistently 100%. Informed MD of pt's concerns. Spent time talking about his recent medical diagnosis and how to prepare for home with his fatigue levels likely to be affected by his upcoming cancer treatments.   Recommended they dc home with a w/c, tub bench, BSC and RW.   Pt agreeable to a shower, placed shower cap on head to avoid staples getting wet.  See ADL documentation below. Overall, pt only needed min A to CGA with mobility and LB dressing with several rest breaks.  He did make a mistake during oral care, using the toothpaste to try to fasten dentures vs the denture cream. Pt did say his vision was blurrier today.   He did demonstrate slight L inattention but it did not limit him much during therapy. Pt opted to rest in bed prior to next therapist.  Pt in bed with all needs met.   Therapy Documentation Precautions:  Precautions Precautions: Fall Recall of Precautions/Restrictions: Impaired Precaution/Restrictions Comments: recent R crani Restrictions Weight Bearing Restrictions Per Provider Order: No   Pain: Pain Assessment Pain Score: 3  Pain Type: Acute pain Pain Location: Neck Pain Descriptors / Indicators: Aching Pain Onset: On-going Pain Intervention(s): Shower ADL: ADL Eating: Unable to assess Grooming: Minimal assistance Where Assessed-Grooming: Chair Upper Body Bathing: Supervision/safety Where Assessed-Upper Body Bathing: Shower Lower Body Bathing: Minimal  assistance Where Assessed-Lower Body Bathing: Shower Upper Body Dressing: Supervision/safety Where Assessed-Upper Body Dressing: Edge of bed Lower Body Dressing: Minimal assistance Where Assessed-Lower Body Dressing: Edge of bed Toileting: Minimal assistance Where Assessed-Toileting: Teacher, adult education: Curator Method: Insurance claims handler: Unable to assess Tub/Shower Transfer Method: Unable to assess [employer] Transfer: Minimal assistance, Administrator, arts Method: Designer, industrial/product: Emergency planning/management officer   Therapy/Group: Individual Therapy  Kristilyn Coltrane 08/07/2024, 12:14 PM

## 2024-08-07 NOTE — Progress Notes (Signed)
 Wayne Joesph BROCKS, DO  Physician Physical Medicine and Rehabilitation   PMR Pre-admission    Signed   Date of Service: 08/04/2024 10:59 AM  Related encounter: ED to Hosp-Admission (Discharged) from 07/06/2024 in [facility]   Signed     Expand All Collapse All  PMR Admission Coordinator Pre-Admission Assessment   Patient: Wayne Molina is an 68 y.o., male MRN: [id number] DOB: May 15, 1956 Height: 5' 9 (175.3 cm) Weight: 77 kg                                                                                                                                                  Insurance Information HMO:     PPO:      PCP:      IPA:      80/20:      OTHER:  PRIMARY: [employer]      Policy#: [id number]      Subscriber: pt CM Name: Thersia Drown      Phone#: not provided     Fax#: [phone] Pre-Cert#: [id number] auth for CIR from [place] with [facility] for admit 9/26 with next review date 08/11/2024.  Updates due to [place] at fax listed above.        Employer:  Benefits:  Phone #: [phone]     Name:  Eff. Date: 11/10/23     Deduct: $0      Out of Pocket Max: [phone] (met $345)      Life Max: n/a  CIR: $395/day for days 1-5      SNF: 20 full days  Outpatient:      Co-Pay: $45/visit Home Health: 100%      Co-Pay:  DME: 80%     Co-Pay: 20% Providers:  SECONDARY: T[employer] for Life      Policy#: [id number]      Phone#:    Artist:       Phone#:    The Data processing manager" for patients in Inpatient Rehabilitation Facilities with attached "Privacy Act Statement-Health Care Records" was provided and verbally reviewed with: Patient and Family   Emergency Contact Information Contact Information       Name Relation Home Work Mobile    [place] Spouse     [phone]    Brinson,Rodney Brother [phone]        Thorp,Angelina Daughter [phone]   [phone]         Other Contacts   None on File      Current Medical History   Patient Admitting Diagnosis: Glioblastoma    History of Present Illness:  [name] is a 68 y/o male with PMH of AF (loop recorder 2017 per Dr.Croitoru maintained on chronic Eliquis ), atypical variant hypertrophic cardiomyopathy, depression/anxiety, HTN,  OSA/tobacco use who presented to [facility] on 07/07/2024 with acute onset of  left-sided weakness/slurred speech and hemianopsia while at work.  Initial CT concerning for right MCA CVA however patient had witnessed seizure in the ER repeat CT was concerning for mass.  EEG showed evidence of L lepto Jenise and cortical dysfunction arising from right hemisphere likely due to underlying structural abnormality.  Additionally there was severe diffuse encephalopathy.  No seizure was seen throughout the recording.  Patient was loaded with Keppra  for seizure prophylaxis and subsequent MRI showed infiltrating tumor concerning for glioblastoma versus lymphoma.  Admission chemistries unremarkable except glucose 125.  Hospital course patient did require intubation for airway protection 8/29 - 9/5 for Enterobacter in the setting of progressive encephalopathy.  Patient underwent right craniotomy for biopsy of brain tumor 07/17/2024 per Dr.Janjua and pathology consistent with glioblastoma multiforme.  Medical oncology Dr. [name] consulted and no current formal plan for treatment to start for 2-3 weeks and latest cranial CT scan 07/26/2024 showed no hydrocephalus without acute changes with similar vasogenic edema and mass effect..  Patient currently remains on Decadron  therapy as indicated.  He had been cleared to resume chronic Eliquis .  Venous Doppler studies 07/23/2024 did show a right gastrocnemius vein patient remained on chronic Eliquis  as indicated.  He remains on Keppra  for seizure prophylaxis.  Palliative care was consulted to establish goals of care.  Leukocytosis 11,400-15,000 felt to be steroid-induced.  He did experience bouts of urinary retention with  Foley/voiding trial and maintained on Flomax .  Patient with decreased nutritional storage initially with nasogastric tube for nutritional support currently on a dysphagia #3, thin liquid diet. Patient has had intermittent confusion and restlessness, especially at nighttime, maintained on Seroquel .  Therapy evaluations completed and pt was recommended for a comprehensive rehab program.    Complete NIHSS TOTAL: 4 Glasgow Coma Scale Score: 15   Patient's medical record from [facility] has been reviewed by the rehabilitation admission coordinator and physician.   Past Medical History      Past Medical History:  Diagnosis Date   Anxiety     Arthritis      fingers   Chronic atrial fibrillation (HCC)     Depression     Family history of breast cancer     Family history of cancer of male genital organ     Family history of gene mutation      BRIP1   Family history of malignant neoplasm of gastrointestinal tract     Hyperlipidemia     Hypertension     Personal history of colonic polyps 11/12/2006   Sleep apnea            Has the patient had major surgery during 100 days prior to admission? Yes   Family History  family history includes Breast cancer (age of onset: 64) in his niece; Cancer in his paternal aunt and paternal great-grandmother; Cancer (age of onset: 42) in his cousin; Cancer (age of onset: 48) in his niece; Colon cancer in his maternal uncle; Colon cancer (age of onset: 92) in his nephew; Dementia in his mother; Diabetes in his father; Heart disease in his father; Kidney disease in his brother; Lung cancer in his paternal aunt; Other in his niece and sister.     Current Medications   Current Medications    Current Facility-Administered Medications:    acetaminophen  (TYLENOL ) tablet 1,000 mg, 1,000 mg, Oral, QID, Gomes, Adriana, DO, 1,000 mg at 08/04/24 0908   albuterol  (PROVENTIL ) (2.5 MG/3ML) 0.083% nebulizer solution 2.5 mg, 2.5 mg, Nebulization, Q6H PRN, Pawar, Rahul,  MD, 2.5 mg at 07/13/24 1416   apixaban  (ELIQUIS ) tablet 5 mg, 5 mg, Oral, BID, Nemecek, Amanda, MD, 5 mg at 08/04/24 0907   atorvastatin  (LIPITOR) tablet 40 mg, 40 mg, Oral, Daily, Delores Suzann HERO, MD, 40 mg at 08/04/24 0908   bismuth  subsalicylate (PEPTO BISMOL) 262 MG/15ML suspension 30 mL, 30 mL, Oral, Q4H PRN, Suknaim, Kulkaew B, DO, 30 mL at 08/03/24 1615   carvedilol  (COREG ) tablet 12.5 mg, 12.5 mg, Oral, BID, Tharon Lung, MD, 12.5 mg at 08/04/24 0907   Chlorhexidine  Gluconate Cloth 2 % PADS 6 each, 6 each, Topical, Daily, Delores Suzann HERO, MD, 6 each at 08/04/24 9075   clotrimazole  (LOTRIMIN ) 1 % cream, , Topical, BID, Mabe, Gerald, MD, Given at 08/04/24 9075   dexamethasone  (DECADRON ) tablet 2 mg, 2 mg, Oral, TID, 2 mg at 08/04/24 0859 **FOLLOWED BY** [START ON 08/10/2024] dexamethasone  (DECADRON ) tablet 2 mg, 2 mg, Oral, BID **FOLLOWED BY** [START ON 08/24/2024] dexamethasone  (DECADRON ) tablet 2 mg, 2 mg, Oral, Daily, Nemecek, Amanda, MD   feeding supplement (ENSURE PLUS HIGH PROTEIN) liquid 237 mL, 237 mL, Oral, TID PC & HS, Delores Suzann HERO, MD, 237 mL at 08/04/24 9173   gabapentin  (NEURONTIN ) capsule 100 mg, 100 mg, Oral, TID, Janna, Adriana, DO, 100 mg at 08/04/24 0909   Gerhardt's butt cream, , Topical, TID, Delores Suzann HERO, MD, Given at 08/04/24 0924   guaiFENesin  (ROBITUSSIN) 100 MG/5ML liquid 15 mL, 15 mL, Oral, Q6H PRN, Nygaard, Joseph, MD   insulin  aspart (novoLOG ) injection 0-9 Units, 0-9 Units, Subcutaneous, TID WC, Hammons, Kimberly B, RPH, 1 Units at 08/04/24 9370   insulin  glargine (LANTUS ) injection 8 Units, 8 Units, Subcutaneous, QHS, Nygaard, Joseph, MD, 8 Units at 08/03/24 2117   lactulose  (CHRONULAC ) 10 GM/15ML solution 10 g, 10 g, Oral, TID, Nemecek, Amanda, MD, 10 g at 08/04/24 0909   levETIRAcetam  (KEPPRA ) tablet 1,000 mg, 1,000 mg, Oral, BID, Yadav, Priyanka O, MD, 1,000 mg at 08/04/24 0908   liver oil-zinc  oxide (DESITIN) 40 % ointment, , Topical, BID PRN, Tharon Lung, MD, Given at 07/24/24 9081   melatonin tablet 3 mg, 3 mg, Oral, QHS, Tharon Lung, MD, 3 mg at 08/03/24 2118   ondansetron  (ZOFRAN ) tablet 4 mg, 4 mg, Oral, Q4H PRN **OR** ondansetron  (ZOFRAN ) injection 4 mg, 4 mg, Intravenous, Q4H PRN, Janjua, Rashid M, MD   Oral care mouth rinse, 15 mL, Mouth Rinse, 4 times per day, Pawar, Rahul, MD, 15 mL at 08/04/24 0859   oxyCODONE  (Oxy IR/ROXICODONE ) immediate release tablet 5 mg, 5 mg, Oral, Q6H PRN, Suknaim, Kulkaew B, DO, 5 mg at 08/02/24 1428   pantoprazole  (PROTONIX ) EC tablet 40 mg, 40 mg, Oral, Daily, Delores Suzann HERO, MD, 40 mg at 08/04/24 0908   phenol (CHLORASEPTIC) mouth spray 1 spray, 1 spray, Mouth/Throat, PRN, Brown, Carina M, MD, 1 spray at 07/25/24 1130   QUEtiapine  (SEROQUEL ) tablet 12.5 mg, 12.5 mg, Oral, QHS PRN, Nygaard, Joseph, MD   QUEtiapine  (SEROQUEL ) tablet 25 mg, 25 mg, Oral, Daily, Mabe, Gerald, MD, 25 mg at 08/03/24 2118   senna (SENOKOT) tablet 8.6 mg, 1 tablet, Oral, Daily, Janna, Adriana, DO, 8.6 mg at 08/04/24 0909   tamsulosin  (FLOMAX ) capsule 0.4 mg, 0.4 mg, Oral, Daily, Janna, Adriana, DO, 0.4 mg at 08/04/24 0907     Patients Current Diet:  Diet Order                  DIET DYS 3 Room service appropriate? 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  Independent   Indoor Mobility: Did the patient need assistance with walking from room to room (with or without device)? Independent   Stairs: Did the patient need assistance with internal or external stairs (with or without device)? Independent   Functional Cognition: Did the patient need help planning regular tasks such as shopping or remembering to take medications? Independent   Patient Information Are you of Hispanic, Latino/a,or Spanish origin?: A. No, not of Hispanic, Latino/a, or Spanish origin What is your race?: A. White Do you need or want an interpreter to communicate with a doctor or health care staff?: 0. No   Patient's Response To:  Health Literacy and Transportation Is the patient able to respond to health literacy and transportation needs?: Yes Health Literacy - How often do you need to have someone help you when you read instructions, pamphlets, or other written material from your doctor or pharmacy?: Never In the past 12 months, has lack of transportation kept you from medical appointments or from getting medications?: No In the past 12 months, has lack of transportation kept you from meetings, work, or from getting things needed for daily living?: No   Home Assistive Devices / Equipment Home Equipment: None   Prior Device Use: Indicate devices/aids used by the patient prior to current illness, exacerbation or injury? None of the above   Current Functional Level Cognition   Arousal/Alertness: Lethargic Overall Cognitive Status: Impaired/Different from baseline Orientation Level: Oriented X4 Attention: Sustained Sustained Attention: Impaired Sustained Attention Impairment: Verbal basic, Functional basic Memory: Impaired Memory Impairment: Decreased recall of new information Awareness: Impaired Awareness Impairment: Intellectual  impairment Problem Solving: Impaired Problem Solving Impairment: Verbal basic, Functional basic Executive Function: Initiating Initiating: Impaired Initiating Impairment: Verbal basic, Functional basic Safety/Judgment: Impaired    Extremity Assessment (includes Sensation/Coordination)   Upper Extremity Assessment: Left hand dominant, LUE deficits/detail LUE Deficits / Details: LUE grossly 3+/5 to MMT LUE Sensation: decreased light touch LUE Coordination: decreased fine motor, decreased gross motor  Lower Extremity Assessment: Generalized weakness RLE Deficits / Details: difficult to fully assess due to lethargy and cognitive and communication deficits but pt denies numbness/tingling bil with light touch but not very specific when asked to state where he was feeling the light touch bil (unsure if due to cog or communication though), able to extend knees against gravity but difficulty flexing hips against gravity with gross MMT scores of 2+ to 4- bil, questionable apraxia with movement as pt would sometimes do the opposite of what was cued LLE Deficits / Details: difficult to fully assess due to lethargy and cognitive and communication deficits but  pt denies numbness/tingling bil with light touch but not very specific when asked to state where he was feeling the light touch bil (unsure if due to cog or communication though), able to extend knees against gravity but difficulty flexing hips against gravity with gross MMT scores of 2+ to 4- bil, questionable apraxia with movement as pt would sometimes do the opposite of what was cued     ADLs   Overall ADL's : Needs assistance/impaired Eating/Feeding: Set up, Bed level Eating/Feeding Details (indicate cue type and reason): sipping from straw in cup without spillage; provided meal and utensil setup on lunch tray Grooming: Wash/dry hands, Wash/dry face, Oral care, Minimal assistance, Cueing for sequencing, Standing Grooming Details (indicate cue type  and reason): cued to terminate oral care task and for completed sequencing, assist for stability Upper Body Bathing: Moderate assistance, Sitting Lower Body Bathing: Maximal assistance Upper Body Dressing : Minimal assistance, Cueing for sequencing, Bed level (HOB elevated) Upper Body Dressing Details (indicate cue type and reason): donned clean gown, cues for sequencing and assist to thoroughly thread LUE Lower Body Dressing: Contact guard assist, Sitting/lateral leans Lower Body Dressing Details (indicate cue type and reason): donned B socks via hip hike technique, inc time with LLE sock Toilet Transfer: Moderate assistance, +2 for physical assistance Toilet Transfer Details (indicate cue type and reason): stand step using RW to recliner Toileting- Clothing Manipulation and Hygiene: Total assistance Functional mobility during ADLs: Moderate assistance, +2 for physical assistance     Mobility   Overal bed mobility: Needs Assistance Bed Mobility: Supine to Sit Rolling: Min assist Sidelying to sit: HOB elevated, Used rails, Min assist Supine to sit: Supervision, HOB elevated, Used rails Sit to supine: Min assist, HOB elevated, Used rails Sit to sidelying: Mod assist, +2 for physical assistance, +2 for safety/equipment General bed mobility comments: increased time     Transfers   Overall transfer level: Needs assistance Equipment used: Rolling walker (2 wheels) Transfers: Sit to/from Stand Sit to Stand: Min assist Bed to/from chair/wheelchair/BSC transfer type:: Step pivot Squat pivot transfers: Total assist, +2 safety/equipment Step pivot transfers: Min assist Transfer via Lift Equipment: Stedy General transfer comment: From EOB with min A for anterior weight shift and power up due to posterior bias     Ambulation / Gait / Stairs / Wheelchair Mobility   Ambulation/Gait Ambulation/Gait assistance: Editor, commissioning (Feet): 10 Feet Assistive device: Rolling walker (2  wheels) Gait Pattern/deviations: Step-to pattern, Trunk flexed, Decreased stride length, Wide base of support General Gait Details: Pt demonstrates short steps with stiff BLE and wide BOS behind RW. Cues for RW proximity, increased step length, and upright posture. Noted BUE stiffness with attempts to bring RW closer. Gait velocity: decr Pre-gait activities: static standing marches     Posture / Balance Dynamic Sitting Balance Sitting balance - Comments: seated EOB without back support Balance Overall balance assessment: Needs assistance Sitting-balance support: No upper extremity supported, Feet supported Sitting balance-Leahy Scale: Good Sitting balance - Comments: seated EOB without back support Postural control: Posterior lean (initially retropulsive) Standing balance support: Bilateral upper extremity supported, Reliant on assistive device for balance, During functional activity Standing balance-Leahy Scale: Poor Standing balance comment: reliant on UE and external support     Special considerations/ Life events Special service needs oncology, palliative following        Previous Home Environment (from acute therapy documentation) Living Arrangements: Spouse/significant other Available Help at Discharge: Family, Available 24 hours/day Type of Home: House Home Layout:  One level Home Access: Level entry Bathroom Shower/Tub: Engineer, manufacturing systems: Standard Home Care Services: No   Discharge Living Setting Plans for Discharge Living Setting: Patient's home, Lives with (comment) (spouse) Type of Home at Discharge: House Discharge Home Layout: One level Discharge Home Access: Level entry Discharge Bathroom Shower/Tub: Tub/shower unit Discharge Bathroom Toilet: Standard Discharge Bathroom Accessibility: Yes How Accessible: Accessible via walker Does the patient have any problems obtaining your medications?: No   Social/Family/Support Systems Patient Roles:  Spouse Anticipated Caregiver: spouse, [name], and brother, Adriana Anticipated Caregiver's Contact Information: [name]  [phone]; Adriana [phone] Ability/Limitations of Caregiver: min assist for w/c level, supervision for ambulatory level Caregiver Availability: 24/7 Discharge Plan Discussed with Primary Caregiver: Yes Is Caregiver In Agreement with Plan?: Yes     Goals Patient/Family Goal for Rehab: PT/OT supervision to min assist; SLP supervision Expected length of stay: 5-7 days Additional Information: Discharge plan: home with spouse and brother for 24/7 support.  Will need short rehab stay to maximize functional status for treatment options with oncology Pt/Family Agrees to Admission and willing to participate: Yes Program Orientation Provided & Reviewed with Pt/Caregiver Including Roles  & Responsibilities: Yes     Decrease burden of Care through IP rehab admission: n/a     Possible need for SNF placement upon discharge: Not anticipated.  Plan for short rehab stay to maximize functional status with d/c home with family.      Patient Condition: This patient's condition remains as documented in the consult dated 08/03/24, in which the Rehabilitation Physician determined and documented that the patient's condition is appropriate for intensive rehabilitative care in an inpatient rehabilitation facility. Will admit to inpatient rehab today.   Preadmission Screen Completed By:  Reche FORBES Lowers, PT, DPT 08/04/2024 10:59 AM ______________________________________________________________________   Discussed status with Dr. Emeline on 08/04/24 at  11:07 AM  and received approval for admission today.   Admission Coordinator:  Elwyn Klosinski E Latese Dufault, time 11:07 AM /Date09/26/25             Revision History

## 2024-08-07 NOTE — Progress Notes (Signed)
   08/07/24 1300  Spiritual Encounters  Type of Visit Initial  Care provided to: Pt and family  Reason for visit Advance directives   Chaplain was paged for Advance Directives. The patient requested Chaplain's presence at 3pm. Chaplain follow up.     M.Kubra Susanna Kerry Resident [phone]

## 2024-08-07 NOTE — IPOC Note (Signed)
 Overall Plan of Care [facility]) Patient Details Name: Wayne Molina MRN: [id number] DOB: 1956-05-11  Admitting Diagnosis: GBM (glioblastoma multiforme) [facility])  Hospital Problems: Principal Problem:   GBM (glioblastoma multiforme) (HCC)     Functional Problem List: Nursing Bladder, Bowel, Endurance, Medication Management, Pain, Safety, Skin Integrity  PT Balance, Behavior, Endurance, Motor, Perception, Safety  OT Balance, Perception, Cognition, Endurance, Motor, Pain, Behavior, Safety, Vision  SLP Cognition, Perception, Safety  TR         Basic ADL's: OT Bathing, Dressing, Toileting     Advanced  ADL's: OT None     Transfers: PT Bed Mobility, Bed to Chair, Car, Occupational psychologist, Research scientist (life sciences): PT Ambulation, Psychologist, prison and probation services, Stairs     Additional Impairments: OT None  SLP Communication, Swallowing, Social Cognition expression Problem Solving, Memory, Attention, Awareness  TR      Anticipated Outcomes Item Anticipated Outcome  Self Feeding Supervision  Swallowing  Min A   Basic self-care  Set up/ supervision  Toileting  Contact guard assist   Bathroom Transfers Contact Guard Assist  Bowel/Bladder  manage bowel w mod I and bladder w mod I assist  Transfers  CGA  Locomotion  CGA  Communication  Min A  Cognition  Min A  Pain  Pain < 4 with prns  Safety/Judgment  manage safety w cues   Therapy Plan: PT Intensity: Minimum of 1-2 x/day ,45 to 90 minutes PT Frequency: 5 out of 7 days PT Duration Estimated Length of Stay: 7-10 days OT Intensity: Minimum of 1-2 x/day, 45 to 90 minutes OT Frequency: 5 out of 7 days OT Duration/Estimated Length of Stay: 5-7 days SLP Intensity: Minumum of 1-2 x/day, 30 to 90 minutes SLP Frequency: 3 to 5 out of 7 days SLP Duration/Estimated Length of Stay: 5-7 days   Team Interventions: Nursing Interventions Patient/Family Education, Medication Management, Skin Care/Wound Management, Bladder Management,  Bowel Management, Disease Management/Prevention, Pain Management, Discharge Planning  PT interventions Ambulation/gait training, DME/adaptive equipment instruction, Neuromuscular re-education, Psychosocial support, Stair training, UE/LE Strength taining/ROM, Wheelchair propulsion/positioning, Warden/ranger, Discharge planning, Pain management, Skin care/wound management, Therapeutic Activities, UE/LE Coordination activities, Visual/perceptual remediation/compensation, Therapeutic Exercise, Splinting/orthotics, Patient/family education, Functional mobility training, Disease management/prevention, Cognitive remediation/compensation  OT Interventions Balance/vestibular training, Discharge planning, Pain management, Self Care/advanced ADL retraining, Therapeutic Activities, UE/LE Coordination activities, Cognitive remediation/compensation, Disease mangement/prevention, Functional mobility training, Patient/family education, Skin care/wound managment, Therapeutic Exercise, Visual/perceptual remediation/compensation, DME/adaptive equipment instruction, Neuromuscular re-education, UE/LE Strength taining/ROM, Wheelchair propulsion/positioning  SLP Interventions Cognitive remediation/compensation, Dysphagia/aspiration precaution training, Patient/family education, Therapeutic Activities, Cueing hierarchy  TR Interventions    SW/CM Interventions Discharge Planning, Psychosocial Support, Patient/Family Education   Barriers to Discharge MD  Medical stability and Pending chemo/radiation  Nursing Decreased caregiver support, Wound Care 1 level/level entry w spouse  PT Decreased caregiver support, Incontinence, Insurance for SNF coverage, Pending chemo/radiation    OT Pending chemo/radiation    SLP      SW       Team Discharge Planning: Destination: PT-Home ,OT- Home , SLP-Home Projected Follow-up: PT-Home health PT, Outpatient PT, 24 hour supervision/assistance, OT-  Home health OT, SLP-Home  Health SLP, Outpatient SLP Projected Equipment Needs: PT-To be determined, OT- To be determined, SLP-None recommended by SLP Equipment Details: PT- , OT-  Patient/family involved in discharge planning: PT- Patient, Family member/caregiver,  OT-Patient, Family member/caregiver, SLP-Family member/caregiver  MD ELOS: 5-7d Medical Rehab Prognosis:  Good Assessment: The patient has been admitted for CIR therapies with the  diagnosis of GBM. The team will be addressing functional mobility, strength, stamina, balance, safety, adaptive techniques and equipment, self-care, bowel and bladder mgt, patient and caregiver education, Diabetic amangement. Goals have been set at Sup/Mod I. Anticipated discharge destination is Home .        See Team Conference Notes for weekly updates to the plan of care

## 2024-08-08 ENCOUNTER — Inpatient Hospital Stay (HOSPITAL_COMMUNITY): Admission: RE | Admit: 2024-08-08 | Source: Ambulatory Visit

## 2024-08-08 ENCOUNTER — Other Ambulatory Visit (HOSPITAL_COMMUNITY): Payer: Self-pay

## 2024-08-08 DIAGNOSIS — R4589 Other symptoms and signs involving emotional state: Secondary | ICD-10-CM

## 2024-08-08 LAB — GLUCOSE, CAPILLARY
Glucose-Capillary: 225 mg/dL — ABNORMAL HIGH (ref 70–99)
Glucose-Capillary: 122 mg/dL — ABNORMAL HIGH (ref 70–99)
Glucose-Capillary: 199 mg/dL — ABNORMAL HIGH (ref 70–99)
Glucose-Capillary: 182 mg/dL — ABNORMAL HIGH (ref 70–99)
Glucose-Capillary: 190 mg/dL — ABNORMAL HIGH (ref 70–99)

## 2024-08-08 LAB — POTASSIUM: Potassium: 4.2 mmol/L (ref 3.5–5.1)

## 2024-08-08 MED ORDER — APIXABAN 5 MG PO TABS
5.0000 mg | ORAL_TABLET | Freq: Two times a day (BID) | ORAL | 0 refills | Status: DC
  Filled 2024-08-08 – 2024-10-10 (×3): qty 60, 30d supply, fill #0

## 2024-08-08 MED ORDER — ACETAMINOPHEN 325 MG PO TABS: 325.0000 mg | ORAL_TABLET | ORAL | Status: DC | PRN

## 2024-08-08 MED ORDER — CARVEDILOL 12.5 MG PO TABS
12.5000 mg | ORAL_TABLET | Freq: Two times a day (BID) | ORAL | 0 refills | Status: DC
  Filled 2024-08-08: qty 60, 30d supply, fill #0

## 2024-08-08 MED ORDER — MAGNESIUM OXIDE 250 MG PO TABS
1.0000 | ORAL_TABLET | Freq: Every day | ORAL | 0 refills | Status: DC
  Filled 2024-08-08 – 2024-10-11 (×4): qty 30, 30d supply, fill #0

## 2024-08-08 MED ORDER — LEVETIRACETAM 1000 MG PO TABS
1000.0000 mg | ORAL_TABLET | Freq: Two times a day (BID) | ORAL | 0 refills | Status: DC
  Filled 2024-08-08: qty 60, 30d supply, fill #0

## 2024-08-08 MED ORDER — GERHARDT'S BUTT CREAM
TOPICAL_CREAM | CUTANEOUS | 0 refills | Status: DC
  Filled 2024-08-08: qty 60, 30d supply, fill #0

## 2024-08-08 MED ORDER — DEXAMETHASONE 2 MG PO TABS
2.0000 mg | ORAL_TABLET | Freq: Two times a day (BID) | ORAL | 0 refills | Status: DC
  Filled 2024-08-08: qty 60, 45d supply, fill #0

## 2024-08-08 MED ORDER — GABAPENTIN 100 MG PO CAPS
100.0000 mg | ORAL_CAPSULE | Freq: Three times a day (TID) | ORAL | 0 refills | Status: DC
  Filled 2024-08-08: qty 90, 30d supply, fill #0

## 2024-08-08 MED ORDER — ATORVASTATIN CALCIUM 40 MG PO TABS
40.0000 mg | ORAL_TABLET | Freq: Every day | ORAL | 0 refills | Status: DC
  Filled 2024-08-08 – 2024-10-10 (×3): qty 30, 30d supply, fill #0

## 2024-08-08 MED ORDER — PANTOPRAZOLE SODIUM 40 MG PO TBEC
40.0000 mg | DELAYED_RELEASE_TABLET | Freq: Every day | ORAL | 0 refills | Status: DC
  Filled 2024-08-08: qty 30, 30d supply, fill #0

## 2024-08-08 MED ORDER — TAMSULOSIN HCL 0.4 MG PO CAPS
0.4000 mg | ORAL_CAPSULE | Freq: Every day | ORAL | 0 refills | Status: DC
  Filled 2024-08-08: qty 30, 30d supply, fill #0

## 2024-08-08 MED ORDER — QUETIAPINE FUMARATE 25 MG PO TABS
25.0000 mg | ORAL_TABLET | Freq: Every day | ORAL | 0 refills | Status: DC
  Filled 2024-08-08: qty 30, 30d supply, fill #0

## 2024-08-08 MED ORDER — CALCIUM CARBONATE 1500 (600 CA) MG PO TABS
1500.0000 mg | ORAL_TABLET | Freq: Two times a day (BID) | ORAL | 0 refills | Status: DC
  Filled 2024-08-08: qty 60, 30d supply, fill #0

## 2024-08-08 MED ORDER — OXYCODONE HCL 5 MG PO TABS
5.0000 mg | ORAL_TABLET | Freq: Four times a day (QID) | ORAL | 0 refills | Status: DC | PRN
  Filled 2024-08-08: qty 30, 7d supply, fill #0

## 2024-08-08 MED ORDER — MELATONIN 3 MG PO TABS
3.0000 mg | ORAL_TABLET | Freq: Every day | ORAL | 0 refills | Status: DC
  Filled 2024-08-08: qty 30, 30d supply, fill #0

## 2024-08-08 NOTE — Progress Notes (Signed)
 Patient ID: Wayne Molina, male   DOB: 1956/10/11, 68 y.o.   MRN: [id number] Pt's equipment has been delivered and in his room. Wife aware plan to discharge tomorrow.

## 2024-08-08 NOTE — Plan of Care (Signed)
  Problem: RH Swallowing Goal: LTG Patient will consume least restrictive diet using compensatory strategies with assistance (SLP) Description: LTG:  Patient will consume least restrictive diet using compensatory strategies with assistance (SLP) Outcome: Not Met (patient discharged before speech therapy was initiated)   Problem: RH Cognition - SLP Goal: RH LTG Patient will demonstrate orientation with cues Description:  LTG:  Patient will demonstrate orientation to person/place/time/situation with cues (SLP)   Outcome: Not Met (patient discharged before speech therapy was initiated)   Problem: RH Memory Goal: LTG Patient will use memory compensatory aids to (SLP) Description: LTG:  Patient will use memory compensatory aids to recall biographical/new, daily complex information with cues (SLP) Outcome: Not Met (patient discharged before speech therapy was initiated)   Problem: RH Attention Goal: LTG Patient will demonstrate this level of attention during functional activites (SLP) Description: LTG:  Patient will will demonstrate this level of attention during functional activites (SLP) Outcome: Not Met (patient discharged before speech therapy was initiated)

## 2024-08-08 NOTE — Progress Notes (Signed)
 PROGRESS NOTE   Subjective/Complaints: Patient states he had a CPAP at home years ago but did not use it more than once or twice because it was too loud.  He is wondering if the CPAP's are quieter now.  He does not recall the sleep medicine specialist that he saw in the past.  We discussed that there are loaner's here for the hospital while he is here but would need to see his sleep specialist for outpatient management No headaches no discharge from surgical site  Amg to and from gym SBA using walker  ROS: Constipation, poor sleep at night, negative abdominal pain   Objective:   No results found. Recent Labs    08/07/24 0535  WBC 10.6*  HGB 14.4  HCT 42.6  PLT 231   Recent Labs    08/07/24 0535  NA 135  K 5.6*  CL 103  CO2 23  GLUCOSE 160*  BUN 33*  CREATININE 0.78  CALCIUM  8.6*    Intake/Output Summary (Last 24 hours) at 08/08/2024 0843 Last data filed at 08/08/2024 0815 Gross per 24 hour  Intake 712 ml  Output 1600 ml  Net -888 ml     Wound 07/29/24 0900 Pressure Injury Sacrum Medial;Bilateral Stage 2 -  Partial thickness loss of dermis presenting as a shallow open injury with a red, pink wound bed without slough. (Active)     Wound 08/04/24 1545 Pressure Injury Sacrum Mid Stage 2 -  Partial thickness loss of dermis presenting as a shallow open injury with a red, pink wound bed without slough. (Active)    Physical Exam: Vital Signs Blood pressure 126/65, pulse (!) 102, temperature 97.7 F (36.5 C), temperature source Oral, resp. rate 19, height 5' 8 (1.727 m), weight 69.9 kg, SpO2 99%. Gen: no distress, normal appearing HEENT: severe vocal hoarseness , no stridor Cardio: Reg rate Chest: normal effort, normal rate of breathing Abd: soft, non-distended Ext: no edema GU: Not examined. +Foley, draining clear urine.  Skin: C/D/I. No apparent lesions. Red discoloration of bilateral toes without wounds,  brisk capillary refill. R craniotomy with staples intact, sutures at distal ends, well approximated without drainage.  MSK:      No apparent deformity. Plantarflexion positioning of feet, can range to neutral.      Gait with RW CGA, needs minA to stand, dizziness but no LOB with amb  Neurologic exam:  Cognition: AAO to person, place, time and event.  Language: Fluent, No substitutions or neoglisms. + Moderate dysarthria and hypophonia.  Memory: + Mild memory deficits. + Attention deficits Insight: Fair insight into current condition.  Mood: Pleasant affect, appropriate mood.  Sensation: To light touch intact in BL UEs and LEs  Reflexes: 2+ in BL UE and LEs. Negative Hoffman's and babinski signs bilaterally.  CN: 2-12 grossly intact.  Coordination: No apparent tremors.  Intact finger to thumb bilateral  No dysdiadochokinesis with sup /pron of forearms  Spasticity: MAS 0 in all extremities.       Strength:                RUE: 4/5 SA, 5-/5 EF, 5-/5 EE, 4/5 WE, 4/5 FF, 4/5 FA  LUE:  4/5 SA, 4/5 EF, 4/5 EE, 4-/5 WE, 4-/5 FF, 4-/5 FA                RLE: 4/5 HF, 5/5 KE, 5/5  DF, 5/5  EHL, 5/5  PF                 LLE:  4/5 HF, 5/5 KE, 5/5  DF, 5/5  EHL, 5/5  PF      Assessment/Plan: 1. Functional deficits which require 3+ hours per day of interdisciplinary therapy in a comprehensive inpatient rehab setting. Physiatrist is providing close team supervision and 24 hour management of active medical problems listed below. Physiatrist and rehab team continue to assess barriers to discharge/monitor patient progress toward functional and medical goals  Care Tool:  Bathing    Body parts bathed by patient: Right arm, Left arm, Chest, Abdomen, Front perineal area, Buttocks, Right upper leg, Right lower leg, Left lower leg, Face         Bathing assist Assist Level: Minimal Assistance - Patient > 75%     Upper Body Dressing/Undressing Upper body dressing   What is the patient  wearing?: Pull over shirt    Upper body assist Assist Level: Minimal Assistance - Patient > 75%    Lower Body Dressing/Undressing Lower body dressing      What is the patient wearing?: Underwear/pull up, Pants     Lower body assist Assist for lower body dressing: Moderate Assistance - Patient 50 - 74%     Toileting Toileting Toileting Activity did not occur Press photographer and hygiene only): Refused  Toileting assist Assist for toileting: Contact Guard/Touching assist     Transfers Chair/bed transfer  Transfers assist     Chair/bed transfer assist level: Minimal Assistance - Patient > 75%     Locomotion Ambulation   Ambulation assist      Assist level: Contact Guard/Touching assist Assistive device: Walker-rolling Max distance: 50'   Walk 10 feet activity   Assist     Assist level: Contact Guard/Touching assist Assistive device: Walker-rolling   Walk 50 feet activity   Assist    Assist level: Contact Guard/Touching assist Assistive device: Walker-rolling    Walk 150 feet activity   Assist Walk 150 feet activity did not occur: Safety/medical concerns         Walk 10 feet on uneven surface  activity   Assist Walk 10 feet on uneven surfaces activity did not occur: Safety/medical concerns         Wheelchair     Assist Is the patient using a wheelchair?: Yes Type of Wheelchair: Manual    Wheelchair assist level: Supervision/Verbal cueing Max wheelchair distance: 80 ft    Wheelchair 50 feet with 2 turns activity    Assist        Assist Level: Supervision/Verbal cueing   Wheelchair 150 feet activity     Assist      Assist Level: Moderate Assistance - Patient 50 - 74%   Blood pressure 126/65, pulse (!) 102, temperature 97.7 F (36.5 C), temperature source Oral, resp. rate 19, height 5' 8 (1.727 m), weight 69.9 kg, SpO2 99%.   Medical Problem List and Plan: 1. 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  Status post right craniotomy and biopsy 07/17/2024 per Dr.Janjua.  Continue Decadron  therapy slow wean, follow-up outpatient medical oncology Dr. [name]- reached out to Dr V to coordinate, has 10/2 appt, may need to d/c at [facility] level if wife unable  to provide minA physical.  Reportedly helped pt into bed today              -patient may  shower             -ELOS/Goals: Plan d/c 10/1  Min A PT/OT, SPV SLP -goal is to optimize functional status for upcoming cancer treatment therapies.  Tentative goal to discharge to appointment with Dr. [name] 10-2           Weaning Decadron  on 2mg  po q8H , plan to reduce to 2mg  q12H on 10/2  2.  Antithrombotics: -DVT/anticoagulation: Right gastrocnemius DVT  Pharmaceutical: Eliquis  chronic for history of atrial fibrillation             -antiplatelet therapy: N/A 3. Left sided neck pain: kpad ordered. Neurontin  100 mg 3 times daily, oxycodone  as needed 4. Mood/Behavior/Sleep: Melatonin 3 mg nightly.  Provide emotional support             -antipsychotic agents: Seroquel  25 mg daily as well as 12.5 mg nightly as needed agitation 5. Neuropsych/cognition: This patient is not capable of making decisions on his own behalf. 6. Skin/Wound Care: Routine skin checks 7. Fluids/Electrolytes/Nutrition: Routine ins and outs with follow-up chemistries 8.  Seizure prophylaxis.  Keppra  1000 mg twice daily 9.  Urinary retention/neurogenic bladder.  Flomax  0.4 mg daily.  D/c foley  10.  Hyperlipidemia.  Lipitor 11.  Hypertension/hypertrophic cardiomyopathy.  Coreg  12.5 mg twice daily.  Monitor with increased mobility, currently hypotensive- consider discussing with cardiology whether lower dose of coreg  can be used Vitals:   08/07/24 2117 08/08/24 0326  BP: 127/77 126/65  Pulse: 77 (!) 102  Resp: 20 19  Temp: 97.9 F (36.6 C) 97.7 F (36.5 C)  SpO2: 98% 99%  Controlled    12.  OSA/tobacco use.  Check oxygen saturations every shift, RT consult placed for  CPAP  13.  Atrial fibrillation.  Status post loop recorder 2017.  Followed by Dr.Croitoru.  Continue Eliquis .  Cardiac rate controlled  14.  Hyperglycemia secondary to Decadron . Expect improvement on Decadron  wean  Hemoglobin A1c 6.0.  continue Lantus  8 units nightly.  Check blood sugars 3 times daily CBG (last 3)  Recent Labs    08/07/24 1157 08/07/24 2116 08/08/24 0553  GLUCAP 165* 204* 122*   Expect improvement with Decadron  taper   15.  Decreased nutritional storage.  Dysphagia #3 thin liquid diet.  Follow-up dietary services  16.  Wound of gluteal cleft.  Suspect fungal etiology.  17.  Constipation/neurogenic bowel.  Last BM 9/28- on lactulose  TID, senna daily , adding Mag gluconate as well for low Mg++ but should help as well   LOS: 4 days A FACE TO FACE EVALUATION WAS PERFORMED  Prentice FORBES Compton 08/08/2024, 8:43 AM

## 2024-08-08 NOTE — Progress Notes (Signed)
 Occupational Therapy Discharge Summary  Patient Details  Name: Wayne Molina MRN: [id number] Date of Birth: Apr 16, 1956  Date of Discharge from OT service:August 08, 2024  Today's Date: 08/08/2024 OT Individual Time: [phone] OT Individual Time Calculation (min): 30 min   Pt seen this session for family educatiowith his spouse, brother and sister in law.  Pt taken to ADL apt to demonstrate with return demonstration with his wife and brother helping of pt ambulating with RW to bathroom to practice tub bench transfer.  Reviewed safe and effective sit to stands with pushing up and leaning forward slightly for improved control of movement which pt was able to do with cues.   Discussed that if he is having an energetic day he can ambulate with RW to toilet and tub, but if he is extremely fatigued he should use the BSC and only do a sponge bath.  Pt demonstrated the ability to do the transfers with only light CGA to supervision. Reviewed how to place curtain with tub bench to avoid water  spillage, hand held shower recommendations, and for pt to only do lateral leans to wash bottom vs standing in tub.   He would benefit from a long handled sponge to reach feet more easily.     Discussed energy conservation and  activities to keep his memory and mental acuity strong.   Patient has met 9 of 13 long term goals due to improved activity tolerance, improved balance, ability to compensate for deficits, improved attention, improved awareness, and improved coordination.    Patient to discharge at [facility] Assist level.  Patient's care partner is independent to provide the necessary physical and cognitive assistance at discharge.    Family education completed with his spouse, brother and sister in law.    Reasons goals not met:   Pt is at an overall CGA level. His wife and family are able to assist pt at this level. Goals not fully met as pt had short LOS to leave hospital for a medical appointment. He  had sit to stand and balance goals at mod ind, bathing and LB dressing as supervision.    Recommendation:  Patient will benefit from ongoing skilled OT services in home health setting to continue to advance functional skills in the area of BADL.  Equipment: BSC - wife will obtain tub bench on her own   Reasons for discharge: treatment goals met  Patient/family agrees with progress made and goals achieved: Yes  OT Discharge Precautions/Restrictions  Precautions Precautions: Fall Precaution/Restrictions Comments: recent R crani Restrictions Weight Bearing Restrictions Per Provider Order: No   Pain Pain Assessment Pain Score: 0-No pain ADL ADL Eating: Set up Grooming: Setup Where Assessed-Grooming: Chair Upper Body Bathing: Supervision/safety Where Assessed-Upper Body Bathing: Shower Lower Body Bathing: Supervision/safety (using long sponge) Where Assessed-Lower Body Bathing: Shower Upper Body Dressing: Supervision/safety Where Assessed-Upper Body Dressing: Edge of bed Lower Body Dressing: Minimal assistance Where Assessed-Lower Body Dressing: Edge of bed Toileting: Contact guard Where Assessed-Toileting: Teacher, adult education: Furniture conservator/restorer Method: Ambulating Tub/Shower Transfer: Scientific laboratory technician Method: Ship broker: Insurance underwriter: Administrator, arts Method: Designer, industrial/product: Sales promotion account executive Baseline Vision/History: 0 No visual deficits Patient Visual Report: Blurring of vision Eye Alignment: Impaired (comment) Tracking/Visual Pursuits: Left eye does not track laterally Visual Fields: Left visual field deficit Perception   Slight L inattention  Praxis  WFL Cognition Cognition Overall Cognitive Status: Impaired/Different from baseline Arousal/Alertness: Awake/alert  Memory: Impaired Memory Impairment: Retrieval deficit;Decreased  short term memory Decreased Short Term Memory: Verbal basic;Functional basic Attention: Sustained Sustained Attention: Impaired Sustained Attention Impairment: Verbal basic;Functional basic Awareness: Impaired Problem Solving: Impaired Problem Solving Impairment: Verbal basic;Functional basic Brief Interview for Mental Status (BIMS) Repetition of Three Words (First Attempt): 3 Temporal Orientation: Year: Correct Temporal Orientation: Month: Accurate within 5 days Temporal Orientation: Day: Incorrect Recall: Sock: Yes, no cue required Recall: Blue: Yes, no cue required Recall: Bed: Yes, after cueing (a piece of furniture) BIMS Summary Score: 13 Sensation Sensation Light Touch: Appears Intact Coordination Gross Motor Movements are Fluid and Coordinated: No Fine Motor Movements are Fluid and Coordinated: Yes Motor  Motor Motor - Skilled Clinical Observations: Generalized weakness/ fatigue affecting gross and stabilizing motor control Motor - Discharge Observations: Generalized weakness/ fatigue affecting gross and stabilizing motor control Mobility    CGA with RW, CGA to stand Trunk/Postural Assessment  Postural Control Righting Reactions: delayed response  Balance Static Sitting Balance Static Sitting - Level of Assistance: 5: Stand by assistance Dynamic Sitting Balance Dynamic Sitting - Level of Assistance: 5: Stand by assistance Static Standing Balance Static Standing - Level of Assistance: 4: Min assist Dynamic Standing Balance Dynamic Standing - Level of Assistance: 4: Min assist Extremity/Trunk Assessment RUE Assessment General Strength Comments: 4/5 LUE Assessment General Strength Comments: 4/5   Shanta Dorvil 08/08/2024, 12:30 PM

## 2024-08-08 NOTE — Consult Note (Signed)
 Neuropsychological Consultation [facility]   Patient:   Wayne Molina   DOB:   [birth year]  MR Number:  [id number]  Location:  [facility] [address] CENTER [address] [place] [address] Dept: [phone] Loc: [phone]           Date of Service:   08/08/2024  Start Time:   1 PM End Time:   2 PM  Provider/Observer:  Norleen Asa, Psy.D.       Clinical Neuropsychologist       Billing Code/Service: [phone]  Reason for Service:    Wayne Molina is a 68 year old right-handed male with a past medical history of atrial fibrillation (status post loop recorder in 2017, maintained on Eliquis ), a typical variant hypertrophic cardiomyopathy, a history of depression/anxiety, hypertension, hyperlipidemia, and colonic polyps. There is a history of obstructive sleep apnea and tobacco use. A fall was reported a few weeks ago when stepping back off a deck, with no loss of consciousness. No other falls were reported. The patient presented on 07/07/2024 with acute onset of left-sided weakness, slurred speech, and hemiparesis while at work. An initial CT was concerning for a right MCA CVA. The patient experienced a witnessed seizure in the ER, and a repeat CT was concerning for a mass. An EEG showed dysfunction arising from the right hemisphere, likely due to an underlying structural abnormality. The patient was started on Keppra  for seizure prophylaxis. A subsequent MRI showed an infiltrating tumor concerning for glioblastoma versus lymphoma. He underwent a right craniotomy for biopsy of the brain tumor on 07/17/2024. Pathology was consistent with glioblastoma. He is being followed by oncology.   Medical History:   Past Medical History:  Diagnosis Date   Anxiety    Arthritis    fingers   Chronic atrial fibrillation (HCC)    Depression    Family history of breast cancer    Family history of cancer of male genital organ     Family history of gene mutation    BRIP1   Family history of malignant neoplasm of gastrointestinal tract    Hyperlipidemia    Hypertension    Personal history of colonic polyps 11/12/2006   Sleep apnea          Patient Active Problem List   Diagnosis Date Noted   Difficulty coping with disease 08/08/2024   Palliative care by specialist 08/04/2024   Bilateral leg pain 08/02/2024   Ventricular tachycardia (HCC) 08/01/2024   Irritant contact dermatitis due to fecal incontinence 07/27/2024   Urinary retention 07/26/2024   Thrombosis/embolism, venous 07/24/2024   Wound of gluteal cleft 07/23/2024   Sundowning 07/22/2024   Severe sepsis (HCC) 07/21/2024   Brain mass 07/17/2024   Ex-smoker 07/12/2024   Health maintenance alteration 07/12/2024   History of fracture of vertebra 07/12/2024   Malnutrition of moderate degree 07/11/2024   GBM (glioblastoma multiforme) (HCC) 07/07/2024   Chronic health problem 07/07/2024   Altered mental status 07/07/2024   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 07/07/2024   Concern for Stroke (HCC) 07/07/2024   Acute encephalopathy 07/07/2024   Right foot pain 12/16/2022   Chest pain 08/17/2022   Postural dizziness with presyncope 06/10/2022   Genetic testing 03/19/2021   Family history of gene mutation    Family history of breast cancer    Family history of malignant neoplasm of gastrointestinal tract    Family history of cancer of male genital organ  Peyronie disease 06/12/2020   Elevated bilirubin 05/26/2019   Lower urinary tract symptoms (LUTS) 05/24/2019   Osteoporosis 01/27/2017   Erectile dysfunction 01/29/2016   Syncope 01/01/2016   Lumbar vertebral fracture (HCC) 07/22/2015   Hyperlipidemia 07/22/2015   Myocardiopathy (HCC) 07/11/2013   Hypertension 05/23/2012   Sleep apnea: unable to tolerate CPAP 03/04/2011   Atrial fibrillation with RVR (HCC) 01/06/2007    Behavioral Observation/Mental Status:   Wayne Molina  is a  68 year old male. During the interaction, he reported his memory is terrible. He was informed that the current high dose of Keppra  for seizure prevention can affect clear thinking and that the dose may be adjusted over time. The primary goals of care were explained: preventing further decline, improving strength, and preventing further seizures. The purpose of his stay on the current unit is to facilitate a safe transition home and prevent rehospitalization. He was encouraged to ask questions. It was noted that he will be discharged to follow up with oncology. A team conference was planned to discuss the master plan, discharge goals, and a projected discharge date, although the family indicated he was scheduled for discharge soon to accommodate an oncology appointment.  Patient is now planned to be discharged tomorrow to be seen by oncology and ongoing medical status complicate how much and for what purpose CIR would be.          Electronically Signed   _______________________ Norleen Asa, Psy.D. Clinical Neuropsychologist

## 2024-08-08 NOTE — Progress Notes (Signed)
 Inpatient Rehabilitation Care Coordinator Discharge Note   Patient Details  Name: Wayne Molina MRN: [id number] Date of Birth: April 11, 1956   Discharge location: HOME WITH WIFE WHO CAN PROVIDE 24/7 CARE  Length of Stay: 5 DAYS  Discharge activity level: CGA LEVEL  Home/community participation: ACTIVE  Patient response un:Yzjouy Literacy - How often do you need to have someone help you when you read instructions, pamphlets, or other written material from your doctor or pharmacy?: Never  Patient response un:Dnrpjo Isolation - How often do you feel lonely or isolated from those around you?: Never  Services provided included: MD, RD, PT, OT, SLP, RN, CM, Pharmacy, Neuropsych, SW  Financial Services:  Field seismologist Utilized: Private Insurance [employer]  Choices offered to/list presented to: PT AND WIFE  Follow-up services arranged:  DME, Home Health Home Health Agency: [facility]  PT  OT  SP    DME : ROTECH WHEELCHAIR AND 3 IN 1 WIFE TO GET ROLLING WALKER AND TUB SEAT SINCE NOT COVERED    Patient response to transportation need: Is the patient able to respond to transportation needs?: Yes In the past 12 months, has lack of transportation kept you from medical appointments or from getting medications?: No In the past 12 months, has lack of transportation kept you from meetings, work, or from getting things needed for daily living?: No   Patient/Family verbalized understanding of follow-up arrangements:  Yes  Individual responsible for coordination of the follow-up planBETHA [name] Molina [phone]  Confirmed correct DME delivered: Wayne Molina 08/08/2024    Comments (or additional information): WIFE AND PT PREPARED FOR DISCHARGE. WIFE HAS BEEN HERE AND PARTICIPATED IN THERAPIES WITH HUSBAND AND AWARE OF HIS CARE NEEDS.   Summary of Stay    Date/Time Discharge Planning CSW  08/08/24 [phone] HOme with wife who has been here and participated in therapies with pt.  Aware of his need for 24/7 care. Wanted discharge early due to Oncology appointment 10/2 RGD       Wayne Molina, Wayne Molina

## 2024-08-08 NOTE — Progress Notes (Signed)
 Physical Therapy Discharge Summary  Patient Details  Name: Wayne Molina MRN: [id number] Date of Birth: 05/19/1956  Date of Discharge from PT service:August 08, 2024  Patient has met 9 of 10 long term goals due to {due un:6958322}.  Patient to discharge at [facility] level {LOA:3049010}.   Patient's care partner {care partner:3041650} to provide the necessary {assistance:3041652} assistance at discharge.  Reasons goals not met: Pt with quick d/c turnaround due to needing to make oncology appointment, therefore, pt was unable to progress towards stair navigation.  Recommendation:  Patient will benefit from ongoing skilled PT services in {setting:3041680} to continue to advance safe functional mobility, address ongoing impairments in ***, and minimize fall risk.  Equipment: {equipment:3041657}  Reasons for discharge: {Reason for discharge:3049018}  Patient/family agrees with progress made and goals achieved: {Pt/Family agree with progress/goals:3049020}  PT Discharge Precautions/Restrictions Precautions Precautions: Fall Precaution/Restrictions Comments: recent R crani Restrictions Weight Bearing Restrictions Per Provider Order: No Pain Pain Assessment Pain Scale: 0-10 Pain Score: 0-No pain Pain Interference   Vision/Perception  Vision - History Ability to See in Adequate Light: 1 Impaired Perception Perception: Impaired Preception Impairment Details: Inattention/Neglect (has slightly improved since evaluation)  Cognition Overall Cognitive Status: Impaired/Different from baseline Arousal/Alertness: Awake/alert Orientation Level: Oriented to place;Oriented to person;Oriented to situation;Disoriented to time Year: 2025 Month: September Day of Week: Incorrect Attention: Sustained Sustained Attention: Impaired Memory: Impaired Memory Impairment: Decreased short term memory Safety/Judgment: Impaired Sensation Sensation Light Touch: Appears Intact Hot/Cold:  Appears Intact Proprioception: Appears Intact Coordination Gross Motor Movements are Fluid and Coordinated: Yes Motor  Motor Motor: Other (comment) Motor - Discharge Observations: Generalized weakness/ fatigue affecting gross and stabilizing motor control  Mobility Bed Mobility Bed Mobility: Rolling Left;Sit to Supine;Supine to Sit Rolling Left: Supervision/Verbal cueing Right Sidelying to Sit: Supervision/Verbal cueing Supine to Sit: Supervision/Verbal cueing Sit to Supine: Supervision/Verbal cueing Transfers Transfers: Sit to Stand;Stand to Sit;Stand Pivot Transfers Sit to Stand: Supervision/Verbal cueing Stand to Sit: Supervision/Verbal cueing Stand Pivot Transfers: Supervision/Verbal cueing Stand Pivot Transfer Details: Verbal cues for safe use of DME/AE;Verbal cues for sequencing Transfer (Assistive device): Rolling walker Locomotion  Gait Ambulation: Yes Gait Assistance: Supervision/Verbal cueing Gait Distance (Feet): 173 Feet Assistive device: Rolling walker Gait Assistance Details: Verbal cues for safe use of DME/AE;Verbal cues for technique;Verbal cues for gait pattern;Verbal cues for sequencing Gait Gait: Yes Gait Pattern: Impaired Gait Pattern: Step-through pattern;Decreased stride length;Right flexed knee in stance;Left flexed knee in stance;Poor foot clearance - right;Poor foot clearance - left Gait velocity: decreased but has improved since evaluation Wheelchair Mobility Wheelchair Mobility: Yes Wheelchair Assistance: Doctor, general practice: Both upper extremities Wheelchair Parts Management: Needs assistance Distance: 200  Trunk/Postural Assessment  Cervical Assessment Cervical Assessment: Exceptions to [facility] (forward head (downward gaze slightly improved since evaluation)) Thoracic Assessment Thoracic Assessment: Exceptions to [facility] (mild trunk in flexion) Lumbar Assessment Lumbar Assessment: Exceptions to [facility] (mild posterior pelvic  tilt in standing) Postural Control Postural Control: Deficits on evaluation Righting Reactions: delayed response  Balance Balance Balance Assessed: Yes Static Sitting Balance Static Sitting - Balance Support: Feet supported;Bilateral upper extremity supported Static Sitting - Level of Assistance: 5: Stand by assistance Dynamic Sitting Balance Dynamic Sitting - Balance Support: Feet supported Dynamic Sitting - Level of Assistance: 5: Stand by assistance Static Standing Balance Static Standing - Balance Support: Bilateral upper extremity supported Static Standing - Level of Assistance: 5: Stand by assistance Dynamic Standing Balance Dynamic Standing - Balance Support: During functional activity;Left upper extremity supported Dynamic Standing - Level  of Assistance: Other (comment) (CGA) Extremity Assessment  RLE Assessment RLE Assessment: Exceptions to [facility] Strength Comments: Grossly 3+ to 5/5 (not formally assesed) LLE Assessment LLE Assessment: Exceptions to [facility] General Strength Comments: Grossly 4 to 5/5 (not formally assessed)   Pearley Baranek A Indalecio Malmstrom 08/08/2024, 3:53 PM

## 2024-08-08 NOTE — Progress Notes (Signed)
 Speech Language Pathology Discharge Summary  Patient Details  Name: Wayne Molina MRN: [id number] Date of Birth: 1955-11-27  Date of Discharge from SLP service:August 08, 2024  Today's Date: 08/08/2024 SLP Individual Time: 1000-1100 SLP Individual Time Calculation (min): 60 min   Skilled Therapeutic Interventions:   Skilled therapy session focused on cognitive and dysphagia goals. SLP facilitated session by re-evaluating patients cognitive linguistic skills via the [employer]ogn[employer] standardized assessment. Patient independently shared biographical information re: self, job and medical situation, though not oriented to correct date/day of the week. Patient with mild deficits in sustained attention and moderate deficits in problem solving and short term memory. Expressive/receptive language intact, though patient with hoarse vocal quality likely secondary to intubation. Recommend ENT consult if this does not resolve upon d/c. SLP observed patient with trials of regular solids and thin liquids. Patient with intermittent throat clears which may be representative of bolus misdirection. Patient with mildly prolonged mastication of solids, though complete oral clearance. Patient/family verbalized understanding of MBS results and risk of aspiration, however request continuation of current diet. SLP encouraged frequent oral care. Patient left in chair with alarm set and call bell in reach. Continue POC.     Patient has met  (0) of 4 long term goals.  Patient to discharge at overall Mod level.  Reasons goals not met: patient d/c before tx initiated   Clinical Impression/Discharge Summary:  Patient did not meet set long term goals as patient discharged before speech therapy was initiated. Patient is discharging at an overall modA level cognitively and for utilization of swallowing compensatory strategies. Pt/family education complete and pt will discharge home with 24 hour supervision from friends/family/etc.  Pt would benefit from f/u ST services to maximize cognition in order to maximize functional independence.     Care Partner:  Caregiver Able to Provide Assistance: Yes  Type of Caregiver Assistance: Physical;Cognitive  Recommendation:  Home Health SLP;Outpatient SLP;24 hour supervision/assistance  Rationale for SLP Follow Up: Maximize cognitive function and independence   Equipment: n/a   Reasons for discharge: Discharged from hospital   Patient/Family Agrees with Progress Made and Goals Achieved: Yes    Varie Machamer M.A., CCC-SLP 08/08/2024, 10:55 AM

## 2024-08-08 NOTE — Progress Notes (Addendum)
 Inpatient Rehabilitation Discharge Medication Review by a Pharmacist  A complete drug regimen review was completed for this patient to identify any potential clinically significant medication issues.  High Risk Drug Classes Is patient taking? Indication by Medication  Antipsychotic Yes Quetiapine  - mood/agitation  Anticoagulant Yes Apixaban  - R gastrocnemius DVT (9/14), hx atrial fibrillation  Antibiotic No   Opioid Yes PRN Oxycodone  - moderate pain  Antiplatelet No   Hypoglycemics/insulin  No   Vasoactive Medication Yes Carvedilol  - hypertension Tamsulosin  - urinary retention  Chemotherapy No   Other Yes Atorvastatin  - hyperlipidemia Calcium  carbonate, magnesium, Vitamin B-12. multivitamin - supplements Dexamethasone  slow taper - cerebral edema due to GBM Gabapentin  - neuropathic pain Levetiracetam  - seizure prophylaxis Melatonin - sleep Pantoprazole  - GERD  PRNs: Acetaminophen  - mild pain     Type of Medication Issue Identified Description of Issue Recommendation(s)  Drug Interaction(s) (clinically significant)     Duplicate Therapy     Allergy     No Medication Administration End Date     Incorrect Dose     Additional Drug Therapy Needed     Significant med changes from prior encounter (inform family/care partners about these prior to discharge). New Apixaban , Dexamethasone  and Levetiracetam  during inpatient admission.  Quetiapine  new during CIR admit.   Losartan  discontinued during inpatient admission.    Communicate changes with patient/family members prior to discharge.  Other       Clinically significant medication issues were identified that warrant physician communication and completion of prescribed/recommended actions by midnight of the next day:  No  Pharmacist comments: - Dexamethasone  to taper from q8h to BID on 10/2 and to daily on 10/16.  - Has been on sliding scale insulin  and Lantus  8 units daily > no insulin  orders at discharge due to steroid  taper and expect glucoses to trend down. Hemoglobin A1C 6.0.  Time spent performing this drug regimen review (minutes): 25   Thank you for allowing pharmacy to be a part of this patient's care.  Genaro Zebedee Calin, RPh 08/08/2024 4:52 PM

## 2024-08-08 NOTE — Progress Notes (Signed)
   08/08/24 1600  Spiritual Encounters  Type of Visit Follow up  Care provided to: Pt and family  Reason for visit Advance directives  OnCall Visit Yes    Chaplain made a follow up visit to notarize the [facility] document. The document notarized by a notary and two witnesses. Chaplain provided a copy to his wife who is his health care agent and put the another copy into his chart.    M.Kubra Wayne Molina Resident [phone]

## 2024-08-08 NOTE — Plan of Care (Signed)
 Pt is at an overall CGA level. His wife and family are able to assist pt at this level. Goals not fully met as pt had short LOS to leave hospital for a medical appointment.   Problem: RH Balance Goal: LTG Patient will maintain dynamic standing with ADLs (OT) Description: LTG:  Patient will maintain dynamic standing balance with assist during activities of daily living (OT)  Outcome: Adequate for Discharge   Problem: Sit to Stand Goal: LTG:  Patient will perform sit to stand in prep for activites of daily living with assistance level (OT) Description: LTG:  Patient will perform sit to stand in prep for activites of daily living with assistance level (OT) Outcome: Adequate for Discharge   Problem: RH Eating Goal: LTG Patient will perform eating w/assist, cues/equip (OT) Description: LTG: Patient will perform eating with assist, with/without cues using equipment (OT) Outcome: Completed/Met   Problem: RH Grooming Goal: LTG Patient will perform grooming w/assist,cues/equip (OT) Description: LTG: Patient will perform grooming with assist, with/without cues using equipment (OT) Outcome: Completed/Met   Problem: RH Bathing Goal: LTG Patient will bathe all body parts with assist levels (OT) Description: LTG: Patient will bathe all body parts with assist levels (OT) Outcome: Adequate for Discharge   Problem: RH Dressing Goal: LTG Patient will perform upper body dressing (OT) Description: LTG Patient will perform upper body dressing with assist, with/without cues (OT). Outcome: Completed/Met Goal: LTG Patient will perform lower body dressing w/assist (OT) Description: LTG: Patient will perform lower body dressing with assist, with/without cues in positioning using equipment (OT) Outcome: Adequate for Discharge   Problem: RH Toileting Goal: LTG Patient will perform toileting task (3/3 steps) with assistance level (OT) Description: LTG: Patient will perform toileting task (3/3 steps) with  assistance level (OT)  Outcome: Completed/Met   Problem: RH Toilet Transfers Goal: LTG Patient will perform toilet transfers w/assist (OT) Description: LTG: Patient will perform toilet transfers with assist, with/without cues using equipment (OT) Outcome: Completed/Met   Problem: RH Tub/Shower Transfers Goal: LTG Patient will perform tub/shower transfers w/assist (OT) Description: LTG: Patient will perform tub/shower transfers with assist, with/without cues using equipment (OT) Outcome: Completed/Met   Problem: RH Memory Goal: LTG Patient will demonstrate ability for day to day recall/carry over during activities of daily living with assistance level (OT) Description: LTG:  Patient will demonstrate ability for day to day recall/carry over during activities of daily living with assistance level (OT). Outcome: Completed/Met   Problem: RH Attention Goal: LTG Patient will demonstrate this level of attention during functional activites (OT) Description: LTG:  Patient will demonstrate this level of attention during functional activites  (OT) Outcome: Completed/Met   Problem: RH Awareness Goal: LTG: Patient will demonstrate awareness during functional activites type of (OT) Description: LTG: Patient will demonstrate awareness during functional activites type of (OT) Outcome: Completed/Met

## 2024-08-08 NOTE — Progress Notes (Signed)
 Physical Therapy Session Note  Patient Details  Name: Wayne Molina MRN: [id number] Date of Birth: 02/17/1956  Today's Date: 08/08/2024 PT Individual Time: 0820-0915; 1425 - 1454 PT Individual Time Calculation (min): 55 min; 29 min   Short Term Goals: Week 1:  PT Short Term Goal 1 (Week 1): STG = LTG d/t ELOS  SESSION 1 Skilled Therapeutic Interventions/Progress Updates: Patient supine in bed on entrance to room. Patient alert and agreeable to PT session.   Patient reported no pain during session. Discussed with pt and pt wife about upcoming d/c (10/1) tomorrow in order for pt to make it to oncology appointment 10/2. Pt feels that curb navigation to be the only thing pt can think of that he would like to work on again in order to safely enter threshold and step at home. Pt already supplied with standard WC with further education provided on part management and how to fold to place in car. Pt and pt wife also reported that respiratory therapy has not been up to see them to assess pt's breathing difficulty when sleeping (PTA reached out to attending physician to relay concern). Pt reported need to have BM. Pt performed supine<sit to EOB with supervision and VC to hand placement and supine<R sidelying via R hip bridge. PTA donned pt's personal shoes for time management. Pt ambulated to toilet with supervision and no LOB and cues to maintain safe proximity to RW. Pt required increased time to have BM (pt missed skilled PT minutes due to). Pt cued to use hand wipes to clean posterior area with pt still attempting to clean from anterior side. Pt with increased difficulty in processing need to lean to R in order to use L hand to wipe. Pt cued to stand to RW and then to use same hand to clean with pt still attempting to reach posteriorly between B LE's. Pt cued to reach around L side to clean. PTA followed up for cleanliness. Pt ambulated from room<>day room gym (173' x 1, and roughly 160' x 1) using RW with WC  follow for safety. Pt required supervision and continued cuing to increase B step length/clearance, upright posture and to maintain safe proximity to RW. Pt provided with seated rest break.  Patient sitting in WC at end of session with brakes locked, family present, and all needs within reach.  SESSION 2 Skilled Therapeutic Interventions/Progress Updates: Patient supine in bed with family present on entrance to room. Patient alert and agreeable to PT session.   Patient reported no pain during session. Chaplain arrived during session with need to obtain signatures with notary (missed time made up end of session)  Therapeutic Activity: Bed Mobility: Pt performed supine<>sit on EOB with supervision and VC to perform supine to sidelying with bridge sequence via L LE and to use B UE to assist with truncal elevation. Pt and pt family provided with continued education on sequence to avoid need of bed railing (informed on potential use of railing on days that pt requires more assistance due to fatigue following treatment at cancer center). Transfers: Pt performed sit<>stand transfers throughout session with supervision and VC for hand placement and safe management of RW.  Pt propelled WC 200' from room<main gym with supervision and safe navigation of hallway obstacles and doorways.  Pt participated in 4 curb navigation with RW and family present for education. Pt required CGA for safety and further education on sequence/safety of navigating curb to enter home with pt requiring multimodal cues to maintain close  proximity to RW and to place RW in safe position when pivoting to sit (R side of RW stopped by edge of curb that required manual placement of RW). Pt family further educated that pt will require cues to increase awareness when using RW for safety. Pt performed x 2 with PTA assisting first round (seated rest break required) and pt wife providing CGA for safety on 2nd (PTA close by for safety). PTA  demonstrated curb navigation to pt wife with errors provided on purpose for pt wife to cue correction without assistance. PTA further educated that pt had slight posterior weight shift onto heels when descending with RW and to cue pt to shift weight forward slightly (or to provide correction for safety if needed). Pt and pt family with no further concerns or questions regarding pt's d/c 10/1. Pt transported back to room in [facility] for time management.   Patient sitting in WC at end of session with brakes locked, family present, and all needs within reach.       Therapy Documentation Precautions:  Precautions Precautions: Fall Recall of Precautions/Restrictions: Impaired Precaution/Restrictions Comments: recent R crani Restrictions Weight Bearing Restrictions Per Provider Order: No  Therapy/Group: Individual Therapy  Codie Hainer PTA 08/08/2024, 11:53 AM

## 2024-08-09 ENCOUNTER — Other Ambulatory Visit (HOSPITAL_COMMUNITY): Payer: Self-pay

## 2024-08-09 LAB — GLUCOSE, CAPILLARY: Glucose-Capillary: 170 mg/dL — ABNORMAL HIGH (ref 70–99)

## 2024-08-09 NOTE — Progress Notes (Signed)
 Location/Histology of Brain Tumor: Glioblastoma Multiforme  Patient presented with symptoms of:  ***  Past or anticipated interventions, if any, per neurosurgery: {:18581}  Past or anticipated interventions, if any, per medical oncology: {:18581}  Dose of Decadron , if applicable:  Decadron  2mg  every 12 hours  Recent neurologic symptoms, if any:  Seizures: {:18581} Headaches: {:18581} Nausea: {:18581} Dizziness/ataxia: {:18581} Difficulty with hand coordination: {:18581} Focal numbness/weakness: {:18581} Visual deficits/changes: {:18581} Confusion/Memory deficits: {:18581}  Painful bone metastases at present, if any: {:18581}  SAFETY ISSUES: Prior radiation? {:18581} Pacemaker/ICD? {:18581} Possible current pregnancy? {:18581} Is the patient on methotrexate? {:18581}  Additional Complaints / other details: ***

## 2024-08-09 NOTE — Progress Notes (Signed)
 PROGRESS NOTE   Subjective/Complaints:  No issues overnite , repeat labs reviewed , K+ normalized Amg to and from gym SBA using walker  ROS: Constipation, poor sleep at night, negative abdominal pain   Objective:   No results found. Recent Labs    08/07/24 0535  WBC 10.6*  HGB 14.4  HCT 42.6  PLT 231   Recent Labs    08/07/24 0535 08/08/24 0915  NA 135  --   K 5.6* 4.2  CL 103  --   CO2 23  --   GLUCOSE 160*  --   BUN 33*  --   CREATININE 0.78  --   CALCIUM  8.6*  --     Intake/Output Summary (Last 24 hours) at 08/09/2024 0905 Last data filed at 08/09/2024 9343 Gross per 24 hour  Intake 180 ml  Output --  Net 180 ml     Wound 07/29/24 0900 Pressure Injury Sacrum Medial;Bilateral Stage 2 -  Partial thickness loss of dermis presenting as a shallow open injury with a red, pink wound bed without slough. (Active)     Wound 08/04/24 1545 Pressure Injury Sacrum Mid Stage 2 -  Partial thickness loss of dermis presenting as a shallow open injury with a red, pink wound bed without slough. (Active)    Physical Exam: Vital Signs Blood pressure (!) 101/90, pulse 90, temperature 97.8 F (36.6 C), temperature source Oral, resp. rate 16, height 5' 8 (1.727 m), weight 69.9 kg, SpO2 98%.  General: No acute distress Mood and affect are appropriate Heart: Regular rate and rhythm no rubs murmurs or extra sounds Lungs: Clear to auscultation, breathing unlabored, no rales or wheezes Abdomen: Positive bowel sounds, soft nontender to palpation, nondistended Extremities: No clubbing, cyanosis, or edema Skin: No evidence of breakdown, no evidence of rash   Spasticity: MAS 0 in all extremities.       Strength:                RUE: 4/5 SA, 5-/5 EF, 5-/5 EE, 4/5 WE, 4/5 FF, 4/5 FA                LUE:  4/5 SA, 4/5 EF, 4/5 EE, 4-/5 WE, 4-/5 FF, 4-/5 FA                RLE: 4/5 HF, 5/5 KE, 5/5  DF, 5/5  EHL, 5/5  PF                  LLE:  4/5 HF, 5/5 KE, 5/5  DF, 5/5  EHL, 5/5  PF      Assessment/Plan: 1. Functional deficits which require 3+ hours per day of interdisciplinary therapy in a comprehensive inpatient rehab setting. Physiatrist is providing close team supervision and 24 hour management of active medical problems listed below. Physiatrist and rehab team continue to assess barriers to discharge/monitor patient progress toward functional and medical goals  Care Tool:  Bathing    Body parts bathed by patient: Right arm, Left arm, Chest, Abdomen, Front perineal area, Buttocks, Right upper leg, Right lower leg, Left lower leg, Face, Left upper leg         Bathing assist  Assist Level: Supervision/Verbal cueing (with use of a long sponge)     Upper Body Dressing/Undressing Upper body dressing   What is the patient wearing?: Pull over shirt    Upper body assist Assist Level: Supervision/Verbal cueing    Lower Body Dressing/Undressing Lower body dressing      What is the patient wearing?: Underwear/pull up, Pants     Lower body assist Assist for lower body dressing: Minimal Assistance - Patient > 75%     Toileting Toileting Toileting Activity did not occur Press photographer and hygiene only): Refused  Toileting assist Assist for toileting: Contact Guard/Touching assist     Transfers Chair/bed transfer  Transfers assist     Chair/bed transfer assist level: Contact Guard/Touching assist     Locomotion Ambulation   Ambulation assist      Assist level: Contact Guard/Touching assist Assistive device: Walker-rolling Max distance: 50'   Walk 10 feet activity   Assist     Assist level: Contact Guard/Touching assist Assistive device: Walker-rolling   Walk 50 feet activity   Assist    Assist level: Contact Guard/Touching assist Assistive device: Walker-rolling    Walk 150 feet activity   Assist Walk 150 feet activity did not occur: Safety/medical concerns          Walk 10 feet on uneven surface  activity   Assist Walk 10 feet on uneven surfaces activity did not occur: Safety/medical concerns         Wheelchair     Assist Is the patient using a wheelchair?: Yes Type of Wheelchair: Manual    Wheelchair assist level: Supervision/Verbal cueing Max wheelchair distance: 80 ft    Wheelchair 50 feet with 2 turns activity    Assist        Assist Level: Supervision/Verbal cueing   Wheelchair 150 feet activity     Assist      Assist Level: Moderate Assistance - Patient 50 - 74%   Blood pressure (!) 101/90, pulse 90, temperature 97.8 F (36.6 C), temperature source Oral, resp. rate 16, height 5' 8 (1.727 m), weight 69.9 kg, SpO2 98%.   Medical Problem List and Plan: 1. 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  Status post right craniotomy and biopsy 07/17/2024 per Dr.Janjua.  Continue Decadron  therapy slow wean, follow-up outpatient medical oncology Dr. [name]- reached out to Dr V to coordinate, has 10/2 appt, may need to d/c at [facility] level if wife unable to provide minA physical.  Reportedly helped pt into bed today              -patient may  shower             -ELOS/Goals: Plan d/c 10/1  Min A PT/OT, SPV SLP -goal is to optimize functional status for upcoming cancer treatment therapies.  Tentative goal to discharge to appointment with Dr. [name] 10-2           Weaning Decadron  on 2mg  po q8H , plan to reduce to 2mg  q12H on 10/2  2.  Antithrombotics: -DVT/anticoagulation: Right gastrocnemius DVT  Pharmaceutical: Eliquis  chronic for history of atrial fibrillation             -antiplatelet therapy: N/A 3. Left sided neck pain: kpad ordered. Neurontin  100 mg 3 times daily, oxycodone  as needed 4. Mood/Behavior/Sleep: Melatonin 3 mg nightly.  Provide emotional support             -antipsychotic agents: Seroquel  25 mg daily as well as 12.5 mg nightly as needed  agitation 5. Neuropsych/cognition: This patient is not  capable of making decisions on his own behalf. 6. Skin/Wound Care: Routine skin checks 7. Fluids/Electrolytes/Nutrition: Routine ins and outs with follow-up chemistries 8.  Seizure prophylaxis.  Keppra  1000 mg twice daily 9.  Urinary retention/neurogenic bladder.  Flomax  0.4 mg daily.  D/c foley  10.  Hyperlipidemia.  Lipitor 11.  Hypertension/hypertrophic cardiomyopathy.  Coreg  12.5 mg twice daily.  Monitor with increased mobility, currently hypotensive- consider discussing with cardiology whether lower dose of coreg  can be used Vitals:   08/08/24 1959 08/09/24 0335  BP: 122/82 (!) 101/90  Pulse: 86 90  Resp: 16 16  Temp: 97.8 F (36.6 C) 97.8 F (36.6 C)  SpO2: 97% 98%  Controlled    12.  OSA/tobacco use.  Check oxygen saturations every shift, RT consult placed for CPAP  13.  Atrial fibrillation.  Status post loop recorder 2017.  Followed by Dr.Croitoru.  Continue Eliquis .  Cardiac rate controlled  14.  Hyperglycemia secondary to Decadron . Expect improvement on Decadron  wean  Hemoglobin A1c 6.0.  continue Lantus  8 units nightly.  Check blood sugars 3 times daily CBG (last 3)  Recent Labs    08/08/24 1655 08/08/24 2106 08/09/24 0601  GLUCAP 182* 190* 170*   Expect improvement with Decadron  taper cont current management   15.  Decreased nutritional storage.  Dysphagia #3 thin liquid diet.  Follow-up dietary services  16.  Wound of gluteal cleft.  Suspect fungal etiology.  17.  Constipation/neurogenic bowel.  Last BM 9/28- on lactulose  TID, senna daily , adding Mag gluconate as well for low Mg++ but should help as well  18.  OSA chronic- now interested in using CPAP has home unit which is too loud wants new unit, did overnite oximetry, needs to f/u with sleep medicine , PCP or Cardiology can help, [facility] has sleep med LOS: 5 days A FACE TO FACE EVALUATION WAS PERFORMED  Prentice FORBES Compton 08/09/2024, 9:05 AM

## 2024-08-09 NOTE — Plan of Care (Signed)
 Pt planned for DC home today with wife. Education provided for safe use of RW to use toilet/BSC to empty bowel/bladder. Pt skin is intact on sacrum, clean/dry mepilex in place. and peri area/groin has improved.

## 2024-08-10 ENCOUNTER — Inpatient Hospital Stay: Attending: Internal Medicine | Admitting: Internal Medicine

## 2024-08-10 ENCOUNTER — Telehealth: Payer: Self-pay

## 2024-08-10 ENCOUNTER — Other Ambulatory Visit (HOSPITAL_COMMUNITY): Payer: Self-pay

## 2024-08-10 ENCOUNTER — Encounter: Payer: Self-pay | Admitting: Internal Medicine

## 2024-08-10 ENCOUNTER — Telehealth: Payer: Self-pay | Admitting: *Deleted

## 2024-08-10 ENCOUNTER — Telehealth: Payer: Self-pay | Admitting: Cardiovascular Disease

## 2024-08-10 VITALS — BP 142/101 | HR 91 | Temp 97.3°F | Resp 18 | Wt 153.8 lb

## 2024-08-10 DIAGNOSIS — Z993 Dependence on wheelchair: Secondary | ICD-10-CM | POA: Insufficient documentation

## 2024-08-10 DIAGNOSIS — Z87891 Personal history of nicotine dependence: Secondary | ICD-10-CM

## 2024-08-10 DIAGNOSIS — Z801 Family history of malignant neoplasm of trachea, bronchus and lung: Secondary | ICD-10-CM | POA: Diagnosis not present

## 2024-08-10 DIAGNOSIS — Z79899 Other long term (current) drug therapy: Secondary | ICD-10-CM | POA: Diagnosis not present

## 2024-08-10 DIAGNOSIS — Z803 Family history of malignant neoplasm of breast: Secondary | ICD-10-CM | POA: Insufficient documentation

## 2024-08-10 DIAGNOSIS — Z800 Family history of malignant neoplasm of digestive organs: Secondary | ICD-10-CM | POA: Diagnosis not present

## 2024-08-10 DIAGNOSIS — Z809 Family history of malignant neoplasm, unspecified: Secondary | ICD-10-CM | POA: Diagnosis not present

## 2024-08-10 DIAGNOSIS — R269 Unspecified abnormalities of gait and mobility: Secondary | ICD-10-CM | POA: Diagnosis not present

## 2024-08-10 DIAGNOSIS — R531 Weakness: Secondary | ICD-10-CM | POA: Diagnosis not present

## 2024-08-10 DIAGNOSIS — C719 Malignant neoplasm of brain, unspecified: Secondary | ICD-10-CM

## 2024-08-10 DIAGNOSIS — Z808 Family history of malignant neoplasm of other organs or systems: Secondary | ICD-10-CM | POA: Insufficient documentation

## 2024-08-10 DIAGNOSIS — C713 Malignant neoplasm of parietal lobe: Secondary | ICD-10-CM | POA: Diagnosis present

## 2024-08-10 MED ORDER — ONDANSETRON HCL 8 MG PO TABS
8.0000 mg | ORAL_TABLET | Freq: Three times a day (TID) | ORAL | 1 refills | Status: DC | PRN
  Filled 2024-08-10 – 2024-08-14 (×2): qty 30, 10d supply, fill #0

## 2024-08-10 MED ORDER — TEMOZOLOMIDE 140 MG PO CAPS
140.0000 mg | ORAL_CAPSULE | Freq: Every day | ORAL | 0 refills | Status: DC
  Filled 2024-08-16: qty 21, 21d supply, fill #0

## 2024-08-10 NOTE — Progress Notes (Signed)
 [facility] [address]  [place], [place] [address] [phone]   New Patient Evaluation  Date of Service: 08/10/24 Patient Name: Wayne Molina Patient MRN: [id number] Patient DOB: 15-Sep-1956 Provider: Arthea MARLA Manns, MD  Identifying Statement:  Wayne Molina is a 68 y.o. male with right parietal glioblastoma who presents for initial consultation and evaluation.    Referring Provider: Janna Ferrier, DO [address] [place],  [place] [address]  Oncologic History: 07/17/24: Stereotactic biopsy, R parietal with Dr. Rosslyn; path demonstrates glioblastoma IDH-wt  Biomarkers:  MGMT Unknown.  IDH 1/2 Wild type.  EGFR Unknown  TERT Unknown   History of Present Illness: The patient's records from the referring physician were obtained and reviewed and the patient interviewed to confirm this HPI.  Terril LITTIE Sharps presented to neurologic attention on 07/07/24 with new onset seizures.  CNS imaging demonstrated enhancing masses within right hemisphere and callosum c/w primary brain tumor.  He underwent biopsy on 07/17/24 with Dr. Rosslyn; pathology demonstrated glioblastoma, IDH-wt.  Yesterday he was discharged from acute rehab.  At home he is using a wheelchair or walker to ambulate.  His wife is helping with some of his activities of daily living given his weakness and instability.  He is currently dosing decadron  2mg  twice per day, as well as Keppra  1000mg  twice per day.    Medications: Current Outpatient Medications on File Prior to Visit  Medication Sig Dispense Refill   acetaminophen  (TYLENOL ) 325 MG tablet Take 1-2 tablets (325-650 mg total) by mouth every 4 (four) hours as needed for mild pain (pain score 1-3).     apixaban  (ELIQUIS ) 5 MG TABS tablet Take 1 tablet (5 mg total) by mouth 2 (two) times daily. 60 tablet 0   atorvastatin  (LIPITOR) 40 MG tablet Take 1 tablet (40 mg total) by mouth daily. 30 tablet 0   calcium  carbonate (OSCAL) 1500 (600  Ca) MG TABS tablet Take 1 tablet (1,500 mg total) by mouth 2 (two) times daily with a meal. 60 tablet 0   carvedilol  (COREG ) 12.5 MG tablet Take 1 tablet (12.5 mg total) by mouth 2 (two) times daily. 60 tablet 0   Cyanocobalamin (VITAMIN B-12 PO) Take 1 capsule by mouth daily.     dexamethasone  (DECADRON ) 2 MG tablet Take 1 tablet (2 mg total) by mouth every 12 (twelve) hours until 08/23/2024 then 2 mg daily thereafter 60 tablet 0   gabapentin  (NEURONTIN ) 100 MG capsule Take 1 capsule (100 mg total) by mouth 3 (three) times daily. 90 capsule 0   levETIRAcetam  (KEPPRA ) 1000 MG tablet Take 1 tablet (1,000 mg total) by mouth 2 (two) times daily. 60 tablet 0   Magnesium Oxide 250 MG TABS Take 1 tablet (250 mg total) by mouth daily in the afternoon. 30 tablet 0   melatonin 3 MG TABS tablet Take 1 tablet (3 mg total) by mouth at bedtime. 30 tablet 0   Multiple Vitamin (MULTIVITAMIN ADULT PO) Take 1 tablet by mouth daily.     Nystatin (GERHARDT'S BUTT CREAM) CREA Apply to affected area 60 each 0   oxyCODONE  (OXY IR/ROXICODONE ) 5 MG immediate release tablet Take 1 tablet (5 mg total) by mouth every 6 (six) hours as needed for moderate pain (pain score 4-6). 30 tablet 0   pantoprazole  (PROTONIX ) 40 MG tablet Take 1 tablet (40 mg total) by mouth daily. 30 tablet 0   QUEtiapine  (SEROQUEL ) 25 MG tablet Take 1 tablet (25 mg total)  by mouth daily. 30 tablet 0   tamsulosin  (FLOMAX ) 0.4 MG CAPS capsule Take 1 capsule (0.4 mg total) by mouth daily. 30 capsule 0   No current facility-administered medications on file prior to visit.    Allergies: No Known Allergies Past Medical History:  Past Medical History:  Diagnosis Date   Anxiety    Arthritis    fingers   Chronic atrial fibrillation (HCC)    Depression    Family history of breast cancer    Family history of cancer of male genital organ    Family history of gene mutation    BRIP1   Family history of malignant neoplasm of gastrointestinal tract     Hyperlipidemia    Hypertension    Personal history of colonic polyps 11/12/2006   Sleep apnea    Past Surgical History:  Past Surgical History:  Procedure Laterality Date   2-D echocardiogram  07/22/2010   Ejection fraction greater than 55%. Left atrium moderately dilated. Right atrium moderately dilated. 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 Mild to Moderate MR. Mild to moderate TR.   APPLICATION OF CRANIAL NAVIGATION Right 07/17/2024   Procedure: COMPUTER-ASSISTED NAVIGATION, FOR CRANIAL PROCEDURE;  Surgeon: Rosslyn Dino HERO, MD;  Location: [facility] OR;  Service: Neurosurgery;  Laterality: Right;  CRANIAL NAVIGATION   ATRIAL ABLATION SURGERY  2004   EP IMPLANTABLE DEVICE N/A 02/11/2016   Procedure: Loop Recorder Insertion;  Surgeon: Jerel Balding, MD;  Location: [facility] INVASIVE CV LAB;  Service: Cardiovascular;  Laterality: N/A;   Exercise Myoview stress test  07/08/2000   Nonischemic low-risk.   HIP FRACTURE SURGERY  1970s   IR GENERIC HISTORICAL  01/21/2017   IR RADIOLOGIST EVAL & MGMT 01/21/2017 [facility]-INTERV RAD   STERIOTACTIC STIMULATOR INSERTION Right 07/17/2024   Procedure: OPEN CRANIOTOMY FOR BIOPSY;  Surgeon: Rosslyn Dino HERO, MD;  [facility];  Service: Neurosurgery;  Laterality: Right;  RIGHT STEREOTACTIC BRAIN BIOPSY   Social History:  Social History   Socioeconomic History   Marital status: Divorced    Spouse name: Not on file   Number of children: 3   Years of education: 12   Highest education level: GED or equivalent  Occupational History   Not on file  Tobacco Use   Smoking status: Former    Current packs/day: 1.00    Types: Cigarettes   Smokeless tobacco: Never   Tobacco comments:    Smoked for approximately 6 months in his life  Vaping Use   Vaping status: Never Used  Substance and Sexual Activity   Alcohol use: No   Drug use: No   Sexual activity: Yes  Other Topics Concern   Not on file  Social History Narrative   Patient lives alone in [place] in a one story home.  There are no steps.    Patient enjoys going to the gym, watching TV, and going to antique shops.    Patient is divorced with 3 children, two of which have passed; does have a girlfriend.    Patient has his own car and is able to drive.    Social Drivers of Corporate investment banker Strain: Low Risk  (03/22/2024)   Overall Financial Resource Strain (CARDIA)    Difficulty of Paying Living Expenses: Not hard at all  Food Insecurity: No Food Insecurity (03/22/2024)   Hunger Vital Sign    Worried About Running Out of Food in the Last Year: Never true    Ran Out of Food in the Last Year: Never true  Transportation Needs: No Transportation Needs (03/22/2024)   PRAPARE - Administrator, Civil Service (Medical): No    Lack of Transportation (Non-Medical): No  Physical Activity: Sufficiently Active (03/22/2024)   Exercise Vital Sign    Days of Exercise per Week: 5 days    Minutes of Exercise per Session: 60 min  Stress: No Stress Concern Present (03/22/2024)   [employer] of Occupational Health - Occupational Stress Questionnaire    Feeling of Stress : Only a little  Social Connections: Unknown (03/22/2024)   Social Connection and Isolation Panel    Frequency of Communication with Friends and Family: Not on file    Frequency of Social Gatherings with Friends and Family: Not on file    Attends Religious Services: Not on file    Active Member of Clubs or Organizations: Not on file    Attends Banker Meetings: Not on file    Marital Status: Married  Intimate Partner Violence: Not At Risk (04/16/2023)   Humiliation, Afraid, Rape, and Kick questionnaire    Fear of Current or Ex-Partner: No    Emotionally Abused: No    Physically Abused: No    Sexually Abused: No   Family History:  Family History  Problem Relation Age of Onset   Dementia Mother    Heart disease Father    Diabetes Father    Other Sister        BRIP1 gene mutation   Kidney disease Brother     Colon cancer Maternal Uncle    Lung cancer Paternal Aunt        d. >50   Cancer Paternal Aunt        unknown type, d. >50   Cancer Cousin 17       gynecologic (paternal first cousin)   Colon cancer Nephew 27       arising in colon polyp   Cancer Niece 45       gynecologic; niece in her 60's   Breast cancer Niece 59   Other Niece        BRIP1 gene mutation   Cancer Paternal Great-grandmother        abdominal cancer (PGF's mother) great grandmother    Review of Systems: Constitutional: Doesn't report fevers, chills or abnormal weight loss Eyes: Doesn't report blurriness of vision Ears, nose, mouth, throat, and face: Doesn't report sore throat Respiratory: Doesn't report cough, dyspnea or wheezes Cardiovascular: Doesn't report palpitation, chest discomfort  Gastrointestinal:  Doesn't report nausea, constipation, diarrhea GU: Doesn't report incontinence Skin: Doesn't report skin rashes Neurological: Per HPI Musculoskeletal: Doesn't report joint pain Behavioral/Psych: Doesn't report anxiety  Physical Exam: Vitals:   08/10/24 1020 08/10/24 1029  BP: (!) 146/101 (!) 142/101  Pulse: 91   Resp: 18   Temp: (!) 97.3 F (36.3 C)   SpO2: 98%    KPS: 70. General: Alert, cooperative, pleasant, in no acute distress Head: Normal EENT: No conjunctival injection or scleral icterus.  Lungs: Resp effort normal Cardiac: Regular rate Abdomen: Non-distended abdomen Skin: No rashes cyanosis or petechiae. Extremities: No clubbing or edema  Neurologic Exam: Mental Status: Awake, alert, attentive to examiner. Oriented to self and environment. Language is fluent with intact comprehension.  Some psychomotor slowing noted. Cranial Nerves: Visual acuity is grossly normal. Visual fields are full. Extra-ocular movements intact. No ptosis. Face is symmetric Motor: Tone and bulk are normal. Power is 4+/5 in left arm and leg. Reflexes are symmetric, no pathologic reflexes present.  Sensory: Intact  to light touch Gait: Hemiparetic   Labs: I have reviewed the data as listed    Component Value Date/Time   NA 135 08/07/2024 0535   NA 139 03/07/2024 1018   K 4.2 08/08/2024 0915   CL 103 08/07/2024 0535   CO2 23 08/07/2024 0535   GLUCOSE 160 (H) 08/07/2024 0535   BUN 33 (H) 08/07/2024 0535   BUN 17 03/07/2024 1018   CREATININE 0.78 08/07/2024 0535   CREATININE 0.97 09/18/2015 0927   CALCIUM  8.6 (L) 08/07/2024 0535   PROT 5.8 (L) 08/07/2024 0535   PROT 7.1 03/07/2024 1018   ALBUMIN 2.4 (L) 08/07/2024 0535   ALBUMIN 4.7 03/07/2024 1018   AST 29 08/07/2024 0535   ALT 58 (H) 08/07/2024 0535   ALKPHOS 56 08/07/2024 0535   BILITOT 0.8 08/07/2024 0535   BILITOT 1.5 (H) 03/07/2024 1018   GFRNONAA >60 08/07/2024 0535   GFRAA 71 03/29/2020 0814   Lab Results  Component Value Date   WBC 10.6 (H) 08/07/2024   NEUTROABS 9.0 (H) 08/07/2024   HGB 14.4 08/07/2024   HCT 42.6 08/07/2024   MCV 92.0 08/07/2024   PLT 231 08/07/2024    Imaging: DG Swallowing Func-Speech Pathology Result Date: 07/28/2024 Table formatting from the original result was not included. Modified Barium Swallow Study Patient Details Name: ROSCOE WITTS MRN: [id number] Date of Birth: [birth year] Today's Date: 07/28/2024 HPI/PMH: HPI: Zebulun L. Schroader is a 68 y.o. man who presented to [facility] 8/28 with AMS. MRI large infiltrative tumor with heterogeneous enhancement tracking from the splenium of corpus callosum near midline into right temporal lobe. Course complicated by seizure; intubated 8/29-9/5.  Underwent craniotomy and biopsy 9/8 with result of glioblastoma multiforme stage IV. Has cortrak; on dysphagia 3/thin liquids. PMHx significant for HTN, HLD, Afib on Eliquis , untreated OSA. Clinical Impression: Pt presented with a moderate oropharyngeal dysphagia with a total DIGEST score of  2. Safety grade: 2 (intermittent, not gross aspiration) and efficiency grade: 1 (less than half residue).  There was decreased oral propulsion and  reduced tongue base contraction against pharyngeal wall, leading to residue in the valleculae as well as inconsistent epiglottic inversion over the larynx.  Larger, weightier volumes helped with epiglottic closure.  Aspiration of thin and nectars occurred as trace amounts of material spilled through gap between epiglottis and arytenoids, settling on the on vocal folds and ultimately passing into the trachea.  Pt had poor sensation and did not spontaneously cough in response to aspiration.  As study progressed and pt had more opportunities to swallow, airway protection improved and he had fewer incidents of aspiration by the end of the study.   Spoke with pt and his wife after the MBS - he has been on oral diet of dysphagia 3/thin liquids for ten days without adverse consequences, no pna.  Aspiration is inconsistent and swallow function actually improved as he swallowed more.  Given these factors, we decided pt should remain on PO diet.  He should continue to sit upright for PO intake and intentionally cough intermittently while eating meals.  SLP will follow closely.  D/W RN. Factors that may increase risk of adverse event in presence of aspiration Noe & Lianne 2021): Factors that may increase risk of adverse event in presence of aspiration Noe & Lianne 2021): Limited mobility Recommendations/Plan: Swallowing Evaluation Recommendations Swallowing Evaluation Recommendations Recommendations: PO diet PO Diet Recommendation: Dysphagia 3 (Mechanical soft); Thin liquids (Level 0) Liquid Administration via: Cup; Straw Medication Administration: Via alternative means Supervision: Full  assist for feeding Swallowing strategies  : Clear throat intermittently; Hard cough after swallowing Postural changes: Position pt fully upright for meals Oral care recommendations: Oral care BID (2x/day) Treatment Plan Treatment Plan Treatment recommendations: Therapy as outlined in treatment plan below Follow-up recommendations:  Acute inpatient rehab (3 hours/day) Functional status assessment: Patient has had a recent decline in their functional status and demonstrates the ability to make significant improvements in function in a reasonable and predictable amount of time. Interventions: Aspiration precaution training; Patient/family education; Respiratory muscle strength training Recommendations Recommendations for follow up therapy are one component of a multi-disciplinary discharge planning process, led by the attending physician.  Recommendations may be updated based on patient status, additional functional criteria and insurance authorization. Assessment: Orofacial Exam: Orofacial Exam Oral Cavity - Dentition: Dentures, top Anatomy: Anatomy: WFL Boluses Administered: Boluses Administered Boluses Administered: Thin liquids (Level 0); Mildly thick liquids (Level 2, nectar thick); Moderately thick liquids (Level 3, honey thick); Puree; Solid  Oral Impairment Domain: Oral Impairment Domain Lip Closure: No labial escape Tongue control during bolus hold: Not tested Bolus preparation/mastication: Slow prolonged chewing/mashing with complete recollection Bolus transport/lingual motion: Slow tongue motion Oral residue: Trace residue lining oral structures Location of oral residue : Floor of mouth; Tongue; Lateral sulci Initiation of pharyngeal swallow : Valleculae  Pharyngeal Impairment Domain: Pharyngeal Impairment Domain Soft palate elevation: No bolus between soft palate (SP)/pharyngeal wall (PW) Laryngeal elevation: Complete superior movement of thyroid  cartilage with complete approximation of arytenoids to epiglottic petiole Anterior hyoid excursion: Complete anterior movement Epiglottic movement: Partial inversion Laryngeal vestibule closure: Incomplete, narrow column air/contrast in laryngeal vestibule Pharyngeal stripping wave : Present - diminished Pharyngeal contraction (A/P view only): N/A Pharyngoesophageal segment opening: Complete  distension and complete duration, no obstruction of flow Tongue base retraction: Trace column of contrast or air between tongue base and PPW; Narrow column of contrast or air between tongue base and PPW Pharyngeal residue: Collection of residue within or on pharyngeal structures Location of pharyngeal residue: Valleculae  Esophageal Impairment Domain: Esophageal Impairment Domain Esophageal clearance upright position: Complete clearance, esophageal coating Pill: Pill Consistency administered: -- (na) Penetration/Aspiration Scale Score: Penetration/Aspiration Scale Score 1.  Material does not enter airway: Puree; Solid; Moderately thick liquids (Level 3, honey thick) 8.  Material enters airway, passes BELOW cords without attempt by patient to eject out (silent aspiration) : Thin liquids (Level 0); Mildly thick liquids (Level 2, nectar thick) Compensatory Strategies: Compensatory Strategies Compensatory strategies: Yes Straw: -- (no difference) Chin tuck: Ineffective Ineffective Chin Tuck: Mildly thick liquid (Level 2, nectar thick) Left head turn: -- (negligible difference)   General Information: Caregiver present: No  Diet Prior to this Study: Dysphagia 3 (mechanical soft); Thin liquids (Level 0)   Temperature : Normal   Respiratory Status: WFL   Supplemental O2: None (Room air)   History of Recent Intubation: No  Behavior/Cognition: Alert; Cooperative No data recorded Baseline vocal quality/speech: Dysphonic Volitional Cough: Able to elicit Volitional Swallow: Able to elicit No data recorded Goal Planning: Prognosis for improved oropharyngeal function: Good No data recorded No data recorded Patient/Family Stated Goal: to get pt to eat more, pt with ongoing odynophagia No data recorded Pain: Pain Assessment Pain Assessment: No/denies pain Faces Pain Scale: 0 Pain Location: throat pain - odynophagia Pain Intervention(s): Monitored during session End of Session: Start Time:SLP Start Time (ACUTE ONLY): 1335 Stop Time:  SLP Stop Time (ACUTE ONLY): 1410 Time Calculation:SLP Time Calculation (min) (ACUTE ONLY): 35 min Charges: SLP Evaluations $ SLP Speech  Visit: 1 Visit SLP Evaluations $MBS Swallow: 1 Procedure $Swallowing Treatment: 1 Procedure $Speech Treatment for Individual: 1 Procedure SLP visit diagnosis: SLP Visit Diagnosis: Dysphagia, oropharyngeal phase (R13.12) Past Medical History: Past Medical History: Diagnosis Date  Anxiety   Arthritis   fingers  Chronic atrial fibrillation (HCC)   Depression   Family history of breast cancer   Family history of cancer of male genital organ   Family history of gene mutation   BRIP1  Family history of malignant neoplasm of gastrointestinal tract   Hyperlipidemia   Hypertension   Personal history of colonic polyps 11/12/2006  Sleep apnea  Past Surgical History: Past Surgical History: Procedure Laterality Date  2-D echocardiogram  07/22/2010  Ejection fraction greater than 55%. Left atrium moderately dilated. Right atrium moderately dilated. 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 Mild to Moderate MR. Mild to moderate TR.  APPLICATION OF CRANIAL NAVIGATION Right 07/17/2024  Procedure: COMPUTER-ASSISTED NAVIGATION, FOR CRANIAL PROCEDURE;  Surgeon: Rosslyn Dino HERO, MD;  Location: [facility] OR;  Service: Neurosurgery;  Laterality: Right;  CRANIAL NAVIGATION  ATRIAL ABLATION SURGERY  2004  EP IMPLANTABLE DEVICE N/A 02/11/2016  Procedure: Loop Recorder Insertion;  Surgeon: Jerel Balding, MD;  Location: [facility] INVASIVE CV LAB;  Service: Cardiovascular;  Laterality: N/A;  Exercise Myoview stress test  07/08/2000  Nonischemic low-risk.  HIP FRACTURE SURGERY  1970s  IR GENERIC HISTORICAL  01/21/2017  IR RADIOLOGIST EVAL & MGMT 01/21/2017 [facility]-INTERV RAD  STERIOTACTIC STIMULATOR INSERTION Right 07/17/2024  Procedure: OPEN CRANIOTOMY FOR BIOPSY;  Surgeon: Rosslyn Dino HERO, MD;  Location: [facility] OR;  Service: Neurosurgery;  Laterality: Right;  RIGHT STEREOTACTIC BRAIN BIOPSY Vona Alan Bradford 07/28/2024, 3:37 PM Amanda L.  Vona, MA CCC/SLP Clinical Specialist - Acute Care SLP Acute Rehabilitation Services Office number [phone]  CT HEAD WO CONTRAST ( ) Result Date: 07/26/2024 EXAM: CT HEAD WITHOUT CONTRAST 07/26/2024 01:39:00 PM TECHNIQUE: CT of the head was performed without the administration of intravenous contrast. Automated exposure control, iterative reconstruction, and/or weight based adjustment of the mA/kV was utilized to reduce the radiation dose to as low as reasonably achievable. COMPARISON: CT head 07/23/2024. CLINICAL HISTORY: Follow up imaging post brain biopsy tract hemorrhage. FINDINGS: BRAIN AND VENTRICLES: Similar appearance of right parietal craniotomy for biopsy of mass lesion. There are decreased hyperdense components of the mass compared to prior, likely reflecting decreased postbiopsy blood products. Similar vasogenic edema in the right parietal lobe. Additional vasogenic edema in the posterior right temporal lobe also similar to prior. 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 Similar mass effect on the right lateral ventricle. No acute hemorrhage. No evidence of acute infarct. No hydrocephalus. No extra-axial collection. No midline shift. ORBITS: No acute abnormality. SINUSES: No acute abnormality. SOFT TISSUES AND SKULL: No acute soft tissue abnormality. No skull fracture. IMPRESSION: 1. Similar appearance of right parietal craniotomy for biopsy of mass lesion. There are decreased hyperdense components within the mass compatible with decreased post-biopsy blood products. 2. Similar vasogenic edema and mass effect. Electronically signed by: Donnice Mania MD 07/26/2024 04:12 PM EDT RP Workstation: [id number]   CUP PACEART REMOTE DEVICE CHECK Result Date: 07/25/2024 ILR summary report received. Battery status OK. Normal device function. No new symptom, tachy, brady, or pause episodes. No new AF episodes. Presenting EGM with irregular R-R intervals and indiscernible P waves c/w hx of permanent   atrial arrhythmia, V rates are controlled, on Eliquis  per Epic. Monthly summary reports and ROV/PRN. [facility], [facility]  VAS US  LOWER EXTREMITY VENOUS (DVT) Result Date: 07/24/2024  Lower Venous  DVT Study Patient Name:  YUSEF LAMP  Date of Exam:   07/23/2024 Medical Rec #: [id number]      Accession #:    [id number] Date of Birth: 26-Jan-1956       Patient Gender: M Patient Age:   1 years Exam Location:  [facility] Procedure:      VAS US  LOWER EXTREMITY VENOUS (DVT) Referring Phys: CARINA BROWN --------------------------------------------------------------------------------  Indications: Pain.  Risk Factors: Recent cancer diagnosis (GDM). Comparison Study: Previous exam (LLEV) on 08/15/2020 was negative for DVT Performing Technologist: Ezzie Potters RVT, RDMS  Examination Guidelines: A complete evaluation includes B-mode imaging, spectral Doppler, color Doppler, and power Doppler as needed of all accessible portions of each vessel. Bilateral testing is considered an integral part of a complete examination. Limited examinations for reoccurring indications may be performed as noted. The reflux portion of the exam is performed with the patient in reverse Trendelenburg.  +---------+---------------+---------+-----------+----------+--------------+ RIGHT    CompressibilityPhasicitySpontaneityPropertiesThrombus Aging +---------+---------------+---------+-----------+----------+--------------+ CFV      Full           Yes      Yes                                 +---------+---------------+---------+-----------+----------+--------------+ SFJ      Full                                                        +---------+---------------+---------+-----------+----------+--------------+ FV Prox  Full           Yes      Yes                                 +---------+---------------+---------+-----------+----------+--------------+ FV Mid   Full           Yes      Yes                                  +---------+---------------+---------+-----------+----------+--------------+ FV DistalFull           Yes      Yes                                 +---------+---------------+---------+-----------+----------+--------------+ PFV      Full                                                        +---------+---------------+---------+-----------+----------+--------------+ POP      Full           Yes      Yes                                 +---------+---------------+---------+-----------+----------+--------------+ PTV      Full                                                        +---------+---------------+---------+-----------+----------+--------------+  PERO     Full                                                        +---------+---------------+---------+-----------+----------+--------------+ Gastroc  None           No       No                   Acute          +---------+---------------+---------+-----------+----------+--------------+   +---------+---------------+---------+-----------+----------+--------------+ LEFT     CompressibilityPhasicitySpontaneityPropertiesThrombus Aging +---------+---------------+---------+-----------+----------+--------------+ CFV      Full           Yes      Yes                                 +---------+---------------+---------+-----------+----------+--------------+ SFJ      Full                                                        +---------+---------------+---------+-----------+----------+--------------+ FV Prox  Full           Yes      Yes                                 +---------+---------------+---------+-----------+----------+--------------+ FV Mid   Full           Yes      Yes                                 +---------+---------------+---------+-----------+----------+--------------+ FV DistalFull           Yes      Yes                                  +---------+---------------+---------+-----------+----------+--------------+ PFV      Full                                                        +---------+---------------+---------+-----------+----------+--------------+ POP      Full           Yes      Yes                                 +---------+---------------+---------+-----------+----------+--------------+ PTV      Full                                                        +---------+---------------+---------+-----------+----------+--------------+ PERO     Full                                                        +---------+---------------+---------+-----------+----------+--------------+  Gastroc  Full                                                        +---------+---------------+---------+-----------+----------+--------------+     Summary: BILATERAL: - No evidence of deep vein thrombosis seen in the lower extremities, bilaterally. -No evidence of popliteal cyst, bilaterally. RIGHT: Findings consistent with acute intramuscular thrombosis involving the right gastrocnemius veins.   *See table(s) above for measurements and observations. Electronically signed by Debby Robertson on 07/24/2024 at 11:33:00 AM.    Final    CT HEAD WO CONTRAST ( ) Result Date: 07/23/2024 EXAM: CT HEAD WITHOUT CONTRAST 07/23/2024 03:38:00 PM TECHNIQUE: CT of the head was performed without the administration of intravenous contrast. Automated exposure control, iterative reconstruction, and/or weight based adjustment of the mA/kV was utilized to reduce the radiation dose to as low as reasonably achievable. COMPARISON: CT angio of head and neck 10/04/2023 CLINICAL HISTORY: Brain/CNS neoplasm, monitor. Non con. Principal Problem: GBM (glioblastoma multiforme) (HCC). Chief complaints: Weakness, Near Syncope, Code Stroke. Status post biopsy. FINDINGS: BRAIN AND VENTRICLES: Right parietal craniotomy for biopsy of the mass is again noted. The operative  bed is unchanged. More hyperdense component extending into the right posterior aspect of the corpus callosum with stable. This creates mass effect on the right lateral ventricle. Vasogenic edema in the high right parietal lobe is stable. ORBITS: No acute abnormality. SINUSES: No acute abnormality. SOFT TISSUES AND SKULL: No acute soft tissue abnormality. No skull fracture. IMPRESSION: 1. Stable right parietal operative bed and mass with more hyperdense component extending into the right posterior aspect of the corpus callosum, creating mass effect on the right lateral ventricle. 2. Stable vasogenic edema in the high right parietal lobe. Electronically signed by: Lonni Necessary MD 07/23/2024 03:49 PM EDT RP Workstation: [id number]   US  Abdomen Limited RUQ (LIVER/GB) Result Date: 07/23/2024 CLINICAL DATA:  Right upper quadrant abdominal pain EXAM: ULTRASOUND ABDOMEN LIMITED RIGHT UPPER QUADRANT COMPARISON:  None Available. FINDINGS: Gallbladder: Borderline gallbladder wall thickening may be due to nondistention. No sonographic Murphy sign noted by sonographer. Common bile duct: Diameter: 0.4 cm Liver: No focal lesion identified. Nodular liver contour raises suspicion for cirrhosis. Portal vein is patent on color Doppler imaging with normal direction of blood flow towards the liver. Other: None. IMPRESSION: 1. Nodular liver contour raises suspicion for cirrhosis. 2. Borderline gallbladder wall thickening may be due to nondistention. No sonographic Murphy sign noted by sonographer. Electronically Signed   By: Ryan Salvage M.D.   On: 07/23/2024 15:48   Overnight EEG with video Result Date: 07/22/2024 Shelton Arlin KIDD, MD     07/23/2024  7:03 AM Patient Name: ACESON LABELL MRN: [id number] Epilepsy Attending: Arlin KIDD Shelton Referring Physician/Provider: Khaliqdina, Salman, MD Duration: 07/21/2024 1227 to 07/22/2024 1227  Patient history: 68 y.o. male with hx of Htn, HLD, pAfibb on eliquis , OSA who presents  to the ED with acute onset L arm weakness and L hemianopsia. EEG to evaluate for seizure  Level of alertness: awake/ lethargic , asleep  AEDs during EEG study: LEV  Technical aspects: This EEG study was done with scalp electrodes positioned according to the 10-20 International system of electrode placement. Electrical activity was reviewed with band pass filter of 1-70Hz , sensitivity of 7 uV/mm, display speed of 43mm/sec with a 60Hz  notched filter applied as appropriate.  EEG data were recorded continuously and digitally stored.  Video monitoring was available and reviewed as appropriate.  Description: The posterior dominant rhythm consists of 7.5 Hz activity of moderate voltage (25-35 uV) seen predominantly in posterior head regions, asymmetric ( right<left) and reactive to eye opening and eye closing. Sleep was characterized by vertex waves, sleep spindles (12 to 14 Hz), maximal frontocentral region. EEG showed continuous 3 to 6 Hz theta-delta slowing right hemisphere, maximal right temporal region. Intermittent generalized 3-5hz  theta-delta slowing was also noted. Hyperventilation and photic stimulation were not performed.    ABNORMALITY - Continuous slow, right hemisphere - Intermittent slow, generalized  IMPRESSION: This study is suggestive of cortical dysfunction arising from right hemisphere likely due to underlying structural abnormality. Additionally there is mild to moderate diffuse encephalopathy. No definite seizures were seen throughout the recording.   Arlin MALVA Krebs   DG CHEST PORT 1 VIEW Result Date: 07/21/2024 CLINICAL DATA:  68 year old male with cough.  Grade 4 Glioma. EXAM: PORTABLE CHEST 1 VIEW COMPARISON:  Portable chest 07/17/2024 and earlier. FINDINGS: Portable AP semi upright view at 0656 hours. Stable cardiomegaly and mediastinal contours. Stable left chest superficial cardiac device. Enteric feeding tube courses to the abdomen, tip not included. Lung volumes are within normal limits.  Allowing for portable technique the lungs are clear. Visualized tracheal air column is within normal limits. Stable visualized osseous structures. IMPRESSION: No acute cardiopulmonary abnormality. Electronically Signed   By: VEAR Hurst M.D.   On: 07/21/2024 10:48   CT ANGIO HEAD NECK W WO CM Result Date: 07/21/2024 CLINICAL DATA:  Initial evaluation for acute neuro deficit, stroke. EXAM: CT ANGIOGRAPHY HEAD AND NECK TECHNIQUE: Multidetector CT imaging of the head and neck was performed using the standard protocol during bolus administration of intravenous contrast. Multiplanar CT image reconstructions and MIPs were obtained to evaluate the vascular anatomy. Carotid stenosis measurements (when applicable) are obtained utilizing NASCET criteria, using the distal internal carotid diameter as the denominator. RADIATION DOSE REDUCTION: This exam was performed according to the departmental dose-optimization program which includes automated exposure control, adjustment of the mA and/or kV according to patient size and/or use of iterative reconstruction technique. CONTRAST:  75mL OMNIPAQUE  IOHEXOL  350 MG/ML SOLN COMPARISON:  Prior CT from earlier the same day. FINDINGS: CT HEAD FINDINGS Brain: Examination degraded by motion artifact. Patient's right cerebral mass with superimposed hemorrhage again noted, not significantly changed. No new finding from CT performed earlier the same day. Vascular: No visible hyperdense vessel. Skull: No visible new finding. Sinuses/Orbits: No visible new finding. Other: None. Review of the MIP images confirms the above findings CTA NECK FINDINGS Aortic arch: Aortic arch and origin of the great vessels not visualized on this exam. Atheromatous change about the visualized proximal great vessels noted. No visible stenosis. Right carotid system: Right common and internal carotid arteries are patent without stenosis or dissection. Left carotid system: Left common and internal carotid arteries are  patent without dissection. Eccentric calcified plaque at the left carotid bulb with estimated 50% stenosis, stable. 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 Distal reconstitution at the V3 segment via muscular collaterals, stable. Vertebral arteries remain patent as they course into the cranial vault. No visible stenosis or dissection. Skeleton: No worrisome osseous lesions.  Patient is edentulous. Other neck: No other acute finding. Upper chest: No other acute finding. Review of the MIP images confirms the above findings CTA HEAD FINDINGS Anterior circulation: Both internal carotid  arteries are patent to the termini without stenosis. A1 segments patent bilaterally. Right A1 hypoplastic. Normal anterior communicating artery complex. Anterior cerebral arteries patent without stenosis. Atheromatous plaque at the right ICA terminus/proximal right M1 segment with short-segment mild-to-moderate stenosis (series 10, image 111). Left M1 segment widely patent. No proximal MCA branch occlusion. Distal MCA branches perfused and symmetric. Posterior circulation: Right V4 segment patent without stenosis. Right PICA not seen. Left vertebral artery patent to the takeoff of the left PICA, with subsequent reocclusion. Left PICA patent. Basilar diminutive but patent without stenosis. Superior cerebellar and posterior cerebral arteries remain patent bilaterally. Venous sinuses: Grossly patent allowing for timing the contrast bolus. Anatomic variants: As above.  No aneurysm. Review of the MIP images confirms the above findings IMPRESSION: 1. Stable CTA of the head and neck. No large vessel occlusion or other emergent finding. 2. 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 Left V4 segment reoccludes beyond the takeoff of the left PICA. 3. Atheromatous plaque at the  right ICA terminus/proximal right M1 segment with short-segment mild-to-moderate stenosis. 4. Atheromatous plaque at the left carotid bulb with estimated 50% stenosis, stable. 5. Right cerebral mass with superimposed hemorrhage, not significantly changed from CT performed earlier the same day. Electronically Signed   By: Morene Hoard M.D.   On: 07/21/2024 00:03   CT Head Wo Contrast Result Date: 07/20/2024 CLINICAL DATA:  Initial evaluation for acute neuro deficit, stroke suspected. EXAM: CT HEAD WITHOUT CONTRAST TECHNIQUE: Contiguous axial images were obtained from the base of the skull through the vertex without intravenous contrast. RADIATION DOSE REDUCTION: This exam was performed according to the departmental dose-optimization program which includes automated exposure control, adjustment of the mA and/or kV according to patient size and/or use of iterative reconstruction technique. COMPARISON:  Prior CT from 07/18/2024. FINDINGS: Brain: Postoperative changes from prior right temporal craniotomy for tumor biopsy again noted. Postoperative pneumocephalus has resolved. Previously identified infiltrating mass within the right cerebral hemisphere again seen, grossly stable. Focus of hemorrhage within the mass itself has slightly contracted in the interim, now measuring 1.6 x 1.1 x 1.3 cm, previously 1.9 x 1.2 x 1.5 cm. Trace residual hemorrhage along the biopsy tract noted as well, decreased. Similar surrounding vasogenic edema and regional mass effect without significant midline shift. No other new acute intracranial hemorrhage. No visible acute large vessel territory infarct. No other mass lesion or mass effect. No hydrocephalus. No significant extra-axial fluid collection. Vascular: No abnormal hyperdense vessel. Calcified atherosclerosis present at the skull base. Skull: Post craniotomy changes on the right. Skin staples remain in place. Sinuses/Orbits: Globes and orbital soft tissues demonstrate no  acute finding. Paranasal sinuses and mastoid air cells remain largely clear. Other: Nasogastric tube in place. IMPRESSION: 1. Postoperative changes from prior right temporal craniotomy for tumor biopsy. Previously identified infiltrating mass within the right cerebral hemisphere is grossly stable. Focus of hemorrhage within the mass itself has slightly contracted in the interim, now measuring 1.6 x 1.1 x 1.3 cm, previously 1.9 x 1.2 x 1.5 cm. Trace residual hemorrhage along the biopsy tract has decreased as well. Similar surrounding vasogenic edema and regional mass effect without significant midline shift. 2. No other new acute intracranial abnormality. Electronically Signed   By: Morene Hoard M.D.   On: 07/20/2024 21:01   CT HEAD WO CONTRAST Result Date: 07/18/2024 CLINICAL DATA:  Initial postoperative evaluation. EXAM: CT HEAD WITHOUT CONTRAST TECHNIQUE: Contiguous axial images were  obtained from the base of the skull through the vertex without intravenous contrast. RADIATION DOSE REDUCTION: This exam was performed according to the departmental dose-optimization program which includes automated exposure control, adjustment of the mA and/or kV according to patient size and/or use of iterative reconstruction technique. COMPARISON:  Comparison made with prior CT from 07/08/2024 as well as previous exams. FINDINGS: Brain: Postoperative changes from interval right temporal craniotomy for presumed biopsy of previously identified infiltrating right cerebral mass. Small amount of postoperative pneumocephalus overlies the right cerebral convexity. Focus of hemorrhage measuring up to 1.4 cm seen along the biopsy tract at the posterior right temporal lobe (series 2, image 15). Additional focus of hemorrhage within the resection cavity itself measures 1.9 x 1.2 x 1.5 cm (series 2, image 18). No other complicating features. The underlying infiltrating mass involving the right temporal lobe with extension into the  right splenium is otherwise grossly stable. Surrounding vasogenic edema with mass effect on the posterior right lateral ventricle, stable. No midline shift. Basilar cisterns remain patent. Remainder of the brain is otherwise stable. No other acute intracranial hemorrhage. No acute large vessel territory infarct. No other mass lesion or mass effect. No hydrocephalus or extra-axial fluid collection. Vascular: No abnormal hyperdense vessel. Calcified atherosclerosis present at the skull base. Skull: Post craniotomy changes on the right. Skin staples in subgaleal drain remain in place. No adverse features. Sinuses/Orbits: Globes orbital soft tissues demonstrate no acute finding. Paranasal sinuses are largely clear. Small right with trace left mastoid effusions, of doubtful significance. Nasogastric tube in place. Other: None. IMPRESSION: 1. Postoperative changes from interval right temporal craniotomy for biopsy of patient's known right cerebral mass. Small volume postoperative hemorrhage along the biopsy tract and within the resection cavity as above. No other complicating features. The underlying right cerebral mass is otherwise grossly stable. 2. No other new acute intracranial abnormality. Electronically Signed   By: Morene Hoard M.D.   On: 07/18/2024 02:12   DG CHEST PORT 1 VIEW Result Date: 07/17/2024 CLINICAL DATA:  33498 Respiratory failure (HCC) 33498. EXAM: PORTABLE CHEST 1 VIEW COMPARISON:  07/13/2024. FINDINGS: There is nonspecific heterogeneous opacity overlying the lateral aspect of the right mid lung zone, which was likely present on the prior radiograph. This is indeterminate. Bilateral lung fields are otherwise clear. No acute consolidation or lung collapse. Bilateral costophrenic angles are clear. Stable cardio-mediastinal silhouette. Presumed loop recorder device noted overlying the left lower chest. No acute osseous abnormalities. The soft tissues are within normal limits. Enteric tube is  seen coursing below the left hemidiaphragm however, the tip and side hole are not included in the film. IMPRESSION: *No acute cardiopulmonary abnormality. Nonspecific heterogeneous opacity overlying the lateral aspect of the right mid lung zone, which was likely present on the prior radiograph. This is indeterminate. Correlate clinically to determine the need for further evaluation with nonemergent chest CT scan. Electronically Signed   By: Ree Molt M.D.   On: 07/17/2024 08:18   DG Chest Port 1 View Result Date: 07/13/2024 CLINICAL DATA:  Respiratory distress. EXAM: PORTABLE CHEST 1 VIEW COMPARISON:  Chest radiograph dated 07/11/2024 FINDINGS: Enteric tube extends below the diaphragm with tip beyond the inferior margin of the image. Right-sided PICC with tip over central SVC. There is cardiomegaly with vascular congestion and edema, significantly progressed since the prior radiograph. Pneumonia is not excluded. A small left pleural effusion may be present. No pneumothorax. A loop recorder device. No acute osseous pathology. IMPRESSION: Cardiomegaly with findings of CHF, significantly progressed  since the prior radiograph. Pneumonia is not excluded. Electronically Signed   By: Vanetta Chou M.D.   On: 07/13/2024 19:24    Pathology:  PATHOLOGY SURGICAL PATHOLOGY CASE: [id number] PATIENT: TERRIL SHARPS Surgical Pathology Report     Clinical History: brain mass (cm)     FINAL MICROSCOPIC DIAGNOSIS:  A. BRAIN TUMOR, BIOPSY:      High-grade glioma, WHO grade 4.  B. BRAIN TUMOR, EXCISION:      High-grade glioma, WHO grade 4.  COMMENT:  Histological sections show hypercellular glial proliferation with vascular proliferation consistent with high-grade glioma. The main differential diagnosis includes astrocytoma, IDH-mutant and glioblastoma, IDH-wild type. IDH mutation test will be performed for further classification.  The case was peer-reviewed by Dr. Belvie who agrees with the  diagnosis. Dr. Janjua was notified via Epic messaging at 9:17 am on 07/19/2024.    Assessment/Plan Glioblastoma, IDH-wildtype (HCC)  We appreciate the opportunity to participate in the care of TERRIL LITTIE SHARPS.   We had an extensive conversation with him and his wife regarding pathology, prognosis, and available treatment pathways.  Right now he is limited in his functional status, mainly with regards to gait and left sided weakness.  We discussed several options moving forward, including hypo-fractionated radiation with or without chemotherapy.  We shared data regarding patients 70 and over with functional limitations, in which 3 weeks of RT was essentially equivalent to conventional 6 weeks course.  We ultimately recommended proceeding with abbreviated 3 weeks course of intensity modulated radiation therapy and concurrent daily Temozolomide.  Radiation will be administered Mon-Fri over 6 weeks, Temodar will be dosed at 75mg /m2 to be given daily over 21 days.  We reviewed side effects of temodar, including fatigue, nausea/vomiting, constipation, and cytopenias.  Informed consent was verbally obtained at bedside to proceed with oral chemotherapy.  Chemotherapy should be held for the following:  ANC less than 1,000  Platelets less than 100,000  LFT or creatinine greater than 2x ULN  If clinical concerns/contraindications develop  During week 2 of radiation, labs will be checked accompanied by a clinical evaluation in the brain tumor clinic.  Decadron  should be decreased to 2mg  daily if tolerated.  Screening for potential clinical trials was performed and discussed using eligibility criteria for active protocols at [facility], loco-regional tertiary centers, as well as national database available on [email or web address].    The patient is not a candidate for a research protocol at this time due to no suitable study identified.   We also discussed and patient consented for additional tumor  profiling and sequencing through [facility].  Advanced tumor profiling could help identify actionable mutation for targeted therapy and lead to direct clinical benefit.     Referral will also be placed to social work team to help address any barriers of care at home.  We spent twenty additional minutes teaching regarding the natural history, biology, and historical experience in the treatment of brain tumors. We then discussed in detail the current recommendations for therapy focusing on the mode of administration, mechanism of action, anticipated toxicities, and quality of life issues associated with this plan. We also provided teaching sheets for the patient to take home as an additional resource.  All questions were answered. The patient knows to call the clinic with any problems, questions or concerns. No barriers to learning were detected.  The total time spent in the encounter was 60 minutes and more than 50% was on counseling and review of test results   Kimsey Demaree K Sonnia Strong,  MD Medical Director of Neuro-Oncology [facility] at [place] 08/10/24 10:14 AM

## 2024-08-10 NOTE — Telephone Encounter (Signed)
 Oral Oncology Pharmacist Encounter  Received new prescription for Temodar (temozolomide) for the treatment of right parietal glioblastoma, IDH-wildtype in conjunction with radiation, planned duration of three weeks.  Labs from 08/10/24 assessed. No baseline dose adjustments required based on labs. Creatinine is at baseline and within normal limits. AST within normal limits, ALT slightly elevated, but not concerning. ANC and platelets are within normal limits. Prescription dose and frequency assessed for appropriateness.   Current medication list in Epic reviewed, no DDIs with Temodar (temozolomide) identified.  Evaluated chart and no patient barriers to medication adherence noted.   Patient agreement for treatment documented in MD note on 08/10/24.  Prescription has been e-scribed to the [facility] for benefits analysis and approval.  Oral Oncology Clinic will continue to follow for insurance authorization, copayment issues, initial counseling and start date.  Dionicia Canavan, PharmD, RPh PGY1 Acute Care Pharmacy Resident [facility]  08/10/2024 3:32 PM   Oral Oncology Clinic [phone]

## 2024-08-10 NOTE — Telephone Encounter (Signed)
 Left message for patient to callback. Advised on contacting on-call provider if BP remains elevated this evening.   Signe, physical therapist with [facility], requesting callback for advisement regarding Pt's elevated BP.

## 2024-08-10 NOTE — Progress Notes (Signed)
 START ON PATHWAY REGIMEN - Neuro     One cycle, concurrent with RT:     Temozolomide   **Always confirm dose/schedule in your pharmacy ordering system**  Patient Characteristics: Glioma, Glioblastoma, IDH-wildtype, Newly Diagnosed or Treatment Naive, Poor Performance Status and/or Elderly Patient Disease Classification: Glioma Disease Classification: Glioblastoma, IDH-wildtype Disease Status: Newly Diagnosed or Treatment Naive Performance Status: Poor Performance Status and/or Elderly Patient Intent of Therapy: Non-Curative / Palliative Intent, Discussed with Patient

## 2024-08-10 NOTE — Telephone Encounter (Signed)
 Pt c/o BP issue: STAT if pt c/o blurred vision, one-sided weakness or slurred speech.   1. What is your BP concern?  Bp elevated without symptoms. Signe would like to know where to send patient when he leaves. Call may be returned to Jim's secure voicemail with advisement + patient.  2. Have you taken any BP medication today? Took 1st dose of Carvedilol  this morning, plans to take more this afternoon.  3. What are your last 5 BP readings? 160/100 HR slightly over 100 bpm at rest.  4. Are you having any other symptoms (ex. Dizziness, headache, blurred vision, passed out)?  No

## 2024-08-10 NOTE — Telephone Encounter (Signed)
 Molecular Profiling Requisition faxed to [facility], [phone].  Copy of demographic sheet, insurance cards, pathology report, MD note from 08/10/24 and most recent head CT also sent.  Fax confirmation received.

## 2024-08-10 NOTE — Addendum Note (Signed)
 Addended by: Hamsini Verrilli K on: 08/10/2024 03:10 PM   Modules accepted: Orders

## 2024-08-11 ENCOUNTER — Other Ambulatory Visit (HOSPITAL_COMMUNITY): Payer: Self-pay

## 2024-08-11 ENCOUNTER — Ambulatory Visit
Admission: RE | Admit: 2024-08-11 | Discharge: 2024-08-11 | Disposition: A | Discharge status: Home / Self Care | Source: Ambulatory Visit | Attending: Radiation Oncology | Admitting: Radiation Oncology

## 2024-08-11 ENCOUNTER — Other Ambulatory Visit: Payer: Self-pay

## 2024-08-11 ENCOUNTER — Inpatient Hospital Stay: Admitting: Licensed Clinical Social Worker

## 2024-08-11 ENCOUNTER — Telehealth: Payer: Self-pay

## 2024-08-11 DIAGNOSIS — C713 Malignant neoplasm of parietal lobe: Secondary | ICD-10-CM | POA: Insufficient documentation

## 2024-08-11 DIAGNOSIS — C719 Malignant neoplasm of brain, unspecified: Secondary | ICD-10-CM

## 2024-08-11 DIAGNOSIS — C7931 Secondary malignant neoplasm of brain: Secondary | ICD-10-CM

## 2024-08-11 MED ORDER — LORAZEPAM 0.5 MG PO TABS
ORAL_TABLET | ORAL | 0 refills | Status: DC
Start: 2024-08-11 — End: 2024-08-31
  Filled 2024-08-11: qty 7, 7d supply, fill #0

## 2024-08-11 MED ORDER — LORAZEPAM 0.5 MG PO TABS
ORAL_TABLET | ORAL | 0 refills | Status: DC
  Filled 2024-08-11: qty 7, fill #0

## 2024-08-11 NOTE — Progress Notes (Signed)
 Radiation Oncology         [phone] ________________________________  Initial Outpatient Consultation by telephone.  The patient opted for telemedicine to maximize safety during the pandemic.  MyChart video was not obtainable.   Name: Wayne Molina MRN: [id number]  Date: 08/11/2024  DOB: 04-25-1956  RR:Hnfzd, Kathrine, DO  Rosslyn Dino HERO, MD   REFERRING PHYSICIAN: Rosslyn Dino HERO, MD  DIAGNOSIS:    ICD-10-CM   1. Glioblastoma of parietal lobe (HCC)  C71.3 LORazepam  (ATIVAN ) 0.5 MG tablet    DISCONTINUED: LORazepam  (ATIVAN ) 0.5 MG tablet       WHO grade 4 high-grade glioblastoma, IDH-wildtype  CHIEF COMPLAINT: Here to discuss management of glioblastoma  HISTORY OF PRESENT ILLNESS::Wayne Molina is a 68 y.o. male who presented to the Family Medicine Teaching Service ED at [facility] on 07/06/24 with left sided neurological deficits. 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 A CTA of the head was also performed in the ED which showed: no evidence of acute large vessel occlusion but evidence of occlusion of both vertebral arteries at their origins, with distal reconstitution at the V3 segments via muscular collaterals. The distal left V4 segment subsequently reoccluded beyond the takeoff of the left PICA. An EEG was also performed that was positive for epileptogenicity arising from right hemisphere.   While in the ED, he has a change in mental status with signs of focal seizure including nystagmus, periorbital twitching, and extremity stiffening. He was loaded with keppra  and received Ativan . A repeat CT of the head was performed in this setting which further revealed: an enhancing intra-axial bilobed mass involving the posterior right frontotemporal region, in the region of the previously suspected right MCA infarct, measuring 2.9 cm and 3.7 cm at the right temporal lobe and  splenium, respectively. Evidence of surrounding vasogenic edema was also demonstrated, without midline shift. No other acute intracranial abnormalities were demonstrated.   He was accordingly admitted and underwent further work-up consisting of an MRI of the brain on 08/29 which redemonstrated the large and infiltrative tumor, measuring 6 cm in the greatest extent on this study, with heterogeneous enhancement tracking from the splenium of the corpus callosum near the midline into the right temporal lobe. Imaging also showed evidence of confluent regional tumoral edema without evidence of midline shift or significant intracranial mass effect.   Neurosurgery was consulted and he was cleared to undergo biopsies of the intracranial mass on 07/17/24. Pathology showed findings consistent with WHO grade 4 high-grade glioma. [employer] analysis was ordered on the biopsy specimen. Results were negative for IDH1 and IDH2 mutations. He also had a post-op head CT which showed a reduction in post-biopsy bleeding with expected post-op changes and no other new/acute intracranial findings. [name]   His hospital course was complicated by malnutrition secondary to decreased PO intake during his admission. He subsequently underwent feeding tube placement which was removed prior to discharge. He also developed mild and intermittent oropharyngeal dysphagia and underwent a modified barium swallow study on 07/28/24.   He also developed a pressure ulcer due to immobility during his stay. He received proper wound care with improvement of the ulcer achieved prior to discharge. His hospital course otherwise consisted of IV Dexamethasone  and he was discharged to IP rehab on 09/26.   Other imaging performed during his hospitalization included:  -- CT of the head on 07/20/24 which showed stability of the  right cerebral mass and slight contraction of the hemorrhage within the mass measuring 1.6 cm, previously measuring 1.9 cm. -- CTA of  the head on 07/20/24 showed stable findings in the head and neck without evidence of large vessel occlusion or other emergent findings. -- RUQ abdominal US  on 07/23/14 (in the setting of RUQ abdominal pain) showed: findings suspicious for liver cirrhosis, and borderline gallbladder wall thickening possibly due to nondistention. -- CT of the head and neck on 07/23/24 showed stability of the right parietal mass with a more hyperdense component extending into the right posterior aspect of the corpus callosum, resulting in mass effect on the right lateral ventricle. Imaging otherwise showed stable vasogenic edema in the high right parietal lobe. -- CTA of the head on 07/26/24 showed concurrent findings to the same day head and neck CT.    He was discharged from IP rehab on 10/01. Deficits addressed in rehab included decreased functional mobility and cognitive changes.  Since being discharged from rehab, he was seen in consultation by Dr. [name] yesterday to discuss treatment options. Dr. [name] has recommended proceeding with an abbreviated 3 weeks course of intensity modulated radiation therapy and concurrent daily Temozolomide. The patient has also agreed to pursue additional tumor profiling and sequencing through [facility] in order to help identify actionable mutation for targeted therapy.  PREVIOUS RADIATION THERAPY: No  PAST MEDICAL HISTORY:  has a past medical history of Anxiety, Arthritis, Chronic atrial fibrillation (HCC), Depression, Family history of breast cancer, Family history of cancer of male genital organ, Family history of gene mutation, Family history of malignant neoplasm of gastrointestinal tract, Hyperlipidemia, Hypertension, Personal history of colonic polyps (11/12/2006), and Sleep apnea.    PAST SURGICAL HISTORY: Past Surgical History:  Procedure Laterality Date   2-D echocardiogram  07/22/2010   Ejection fraction greater than 55%. Left atrium moderately dilated. Right atrium  moderately dilated. 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 Mild to Moderate MR. Mild to moderate TR.   APPLICATION OF CRANIAL NAVIGATION Right 07/17/2024   Procedure: COMPUTER-ASSISTED NAVIGATION, FOR CRANIAL PROCEDURE;  Surgeon: Rosslyn Dino HERO, MD;  Location: [facility] OR;  Service: Neurosurgery;  Laterality: Right;  CRANIAL NAVIGATION   ATRIAL ABLATION SURGERY  2004   EP IMPLANTABLE DEVICE N/A 02/11/2016   Procedure: Loop Recorder Insertion;  Surgeon: Jerel Balding, MD;  Location: [facility] INVASIVE CV LAB;  Service: Cardiovascular;  Laterality: N/A;   Exercise Myoview stress test  07/08/2000   Nonischemic low-risk.   HIP FRACTURE SURGERY  1970s   IR GENERIC HISTORICAL  01/21/2017   IR RADIOLOGIST EVAL & MGMT 01/21/2017 [facility]-INTER[facility] RAD   STERIOTACTIC STIMULATOR INSERTION Right 07/17/2024   Procedure: OPEN CRANIOTOMY FOR BIOPSY;  Surgeon: Rosslyn Dino HERO, MD;  Location: [facility] OR;  Service: Neurosurgery;  Laterality: Right;  RIGHT STEREOTACTIC BRAIN BIOPSY    FAMILY HISTORY: family history includes Breast cancer (age of onset: 18) in his niece; Cancer in his paternal aunt and paternal great-grandmother; Cancer (age of onset: 69) in his cousin; Cancer (age of onset: 16) in his niece; Colon cancer in his maternal uncle; Colon cancer (age of onset: 16) in his nephew; Dementia in his mother; Diabetes in his father; Heart disease in his father; Kidney disease in his brother; Lung cancer in his paternal aunt; Other in his niece and sister.  SOCIAL HISTORY:  reports that he has quit smoking. His smoking use included cigarettes. He has never used smokeless tobacco. He reports that he does not drink alcohol and does not use drugs.  ALLERGIES:  Patient has no known allergies.  MEDICATIONS:  Current Outpatient Medications  Medication Sig Dispense Refill   gabapentin  (NEURONTIN ) 100 MG capsule Take 1 capsule (100 mg total) by mouth 3 (three) times daily. 90 capsule 0   acetaminophen  (TYLENOL ) 325 MG tablet Take 1-2 tablets (325-650 mg  total) by mouth every 4 (four) hours as needed for mild pain (pain score 1-3).     apixaban  (ELIQUIS ) 5 MG TABS tablet Take 1 tablet (5 mg total) by mouth 2 (two) times daily. 60 tablet 0   atorvastatin  (LIPITOR) 40 MG tablet Take 1 tablet (40 mg total) by mouth daily. 30 tablet 0   calcium  carbonate (OSCAL) 1500 (600 Ca) MG TABS tablet Take 1 tablet (1,500 mg total) by mouth 2 (two) times daily with a meal. 60 tablet 0   carvedilol  (COREG ) 12.5 MG tablet Take 1 tablet (12.5 mg total) by mouth 2 (two) times daily. 60 tablet 0   Cyanocobalamin (VITAMIN B-12 PO) Take 1 capsule by mouth daily.     dexamethasone  (DECADRON ) 2 MG tablet Take 1 tablet (2 mg total) by mouth every 12 (twelve) hours until 08/23/2024 then 2 mg daily thereafter 60 tablet 0   levETIRAcetam  (KEPPRA ) 1000 MG tablet Take 1 tablet (1,000 mg total) by mouth 2 (two) times daily. 60 tablet 0   LORazepam  (ATIVAN ) 0.5 MG tablet Take 1 tablet 30 minutes prior to radiation procedures as needed to help with anxiety. 7 tablet 0   Magnesium Oxide 250 MG TABS Take 1 tablet (250 mg total) by mouth daily in the afternoon. 30 tablet 0   melatonin 3 MG TABS tablet Take 1 tablet (3 mg total) by mouth at bedtime. 30 tablet 0   Multiple Vitamin (MULTIVITAMIN ADULT PO) Take 1 tablet by mouth daily.     Nystatin (GERHARDT'S BUTT CREAM) CREA Apply to affected area 60 each 0   [START ON 08/14/2024] ondansetron  (ZOFRAN ) 8 MG tablet Take 1 tablet (8 mg total) by mouth every 8 (eight) hours as needed for nausea or vomiting. May take 30-60 minutes prior to Temodar administration if nausea/vomiting occurs as needed. 30 tablet 1   oxyCODONE  (OXY IR/ROXICODONE ) 5 MG immediate release tablet Take 1 tablet (5 mg total) by mouth every 6 (six) hours as needed for moderate pain (pain score 4-6). 30 tablet 0   pantoprazole  (PROTONIX ) 40 MG tablet Take 1 tablet (40 mg total) by mouth daily. 30 tablet 0   QUEtiapine  (SEROQUEL ) 25 MG tablet Take 1 tablet (25 mg total) by  mouth daily. 30 tablet 0   tamsulosin  (FLOMAX ) 0.4 MG CAPS capsule Take 1 capsule (0.4 mg total) by mouth daily. 30 capsule 0   [START ON 08/14/2024] temozolomide (TEMODAR) 140 MG capsule Take 1 capsule (140 mg total) by mouth daily. May take on an empty stomach to decrease nausea & vomiting. 21 capsule 0   No current facility-administered medications for this encounter.    REVIEW OF SYSTEMS:  Notable for that above.   PHYSICAL EXAM:  vitals were not taken for this visit.     LABORATORY DATA:  Lab Results  Component Value Date   WBC 10.6 (H) 08/07/2024   HGB 14.4 08/07/2024   HCT 42.6 08/07/2024   MCV 92.0 08/07/2024   PLT 231 08/07/2024   CMP     Component Value Date/Time   NA 135 08/07/2024 0535   NA 139 03/07/2024 1018   K 4.2 08/08/2024 0915   CL 103 08/07/2024 0535   CO2 23  08/07/2024 0535   GLUCOSE 160 (H) 08/07/2024 0535   BUN 33 (H) 08/07/2024 0535   BUN 17 03/07/2024 1018   CREATININE 0.78 08/07/2024 0535   CREATININE 0.97 09/18/2015 0927   CALCIUM  8.6 (L) 08/07/2024 0535   PROT 5.8 (L) 08/07/2024 0535   PROT 7.1 03/07/2024 1018   ALBUMIN 2.4 (L) 08/07/2024 0535   ALBUMIN 4.7 03/07/2024 1018   AST 29 08/07/2024 0535   ALT 58 (H) 08/07/2024 0535   ALKPHOS 56 08/07/2024 0535   BILITOT 0.8 08/07/2024 0535   BILITOT 1.5 (H) 03/07/2024 1018   EGFR 74 03/07/2024 1018   GFRNONAA >60 08/07/2024 0535         RADIOGRAPHY: DG Swallowing Func-Speech Pathology Result Date: 07/28/2024 Table formatting from the original result was not included. Modified Barium Swallow Study Patient Details Name: Wayne Molina MRN: [id number] Date of Birth: [birth year] Today's Date: 07/28/2024 HPI/PMH: HPI: Wayne Molina is a 68 y.o. man who presented to [facility] 8/28 with AMS. MRI large infiltrative tumor with heterogeneous enhancement tracking from the splenium of corpus callosum near midline into right temporal lobe. Course complicated by seizure; intubated 8/29-9/5.  Underwent craniotomy and  biopsy 9/8 with result of glioblastoma multiforme stage IV. Has cortrak; on dysphagia 3/thin liquids. PMHx significant for HTN, HLD, Afib on Eliquis , untreated OSA. Clinical Impression: Pt presented with a moderate oropharyngeal dysphagia with a total DIGEST score of  2. Safety grade: 2 (intermittent, not gross aspiration) and efficiency grade: 1 (less than half residue).  There was decreased oral propulsion and reduced tongue base contraction against pharyngeal wall, leading to residue in the valleculae as well as inconsistent epiglottic inversion over the larynx.  Larger, weightier volumes helped with epiglottic closure.  Aspiration of thin and nectars occurred as trace amounts of material spilled through gap between epiglottis and arytenoids, settling on the on vocal folds and ultimately passing into the trachea.  Pt had poor sensation and did not spontaneously cough in response to aspiration.  As study progressed and pt had more opportunities to swallow, airway protection improved and he had fewer incidents of aspiration by the end of the study.   Spoke with pt and his wife after the MBS - he has been on oral diet of dysphagia 3/thin liquids for ten days without adverse consequences, no pna.  Aspiration is inconsistent and swallow function actually improved as he swallowed more.  Given these factors, we decided pt should remain on PO diet.  He should continue to sit upright for PO intake and intentionally cough intermittently while eating meals.  SLP will follow closely.  D/W RN. Factors that may increase risk of adverse event in presence of aspiration Noe & Lianne 2021): Factors that may increase risk of adverse event in presence of aspiration Noe & Lianne 2021): Limited mobility Recommendations/Plan: Swallowing Evaluation Recommendations Swallowing Evaluation Recommendations Recommendations: PO diet PO Diet Recommendation: Dysphagia 3 (Mechanical soft); Thin liquids (Level 0) Liquid Administration  via: Cup; Straw Medication Administration: Via alternative means Supervision: Full assist for feeding Swallowing strategies  : Clear throat intermittently; Hard cough after swallowing Postural changes: Position pt fully upright for meals Oral care recommendations: Oral care BID (2x/day) Treatment Plan Treatment Plan Treatment recommendations: Therapy as outlined in treatment plan below Follow-up recommendations: Acute inpatient rehab (3 hours/day) Functional status assessment: Patient has had a recent decline in their functional status and demonstrates the ability to make significant improvements in function in a reasonable and predictable amount of time. Interventions: Aspiration precaution  training; Patient/family education; Respiratory muscle strength training Recommendations Recommendations for follow up therapy are one component of a multi-disciplinary discharge planning process, led by the attending physician.  Recommendations may be updated based on patient status, additional functional criteria and insurance authorization. Assessment: Orofacial Exam: Orofacial Exam Oral Cavity - Dentition: Dentures, top Anatomy: Anatomy: WFL Boluses Administered: Boluses Administered Boluses Administered: Thin liquids (Level 0); Mildly thick liquids (Level 2, nectar thick); Moderately thick liquids (Level 3, honey thick); Puree; Solid  Oral Impairment Domain: Oral Impairment Domain Lip Closure: No labial escape Tongue control during bolus hold: Not tested Bolus preparation/mastication: Slow prolonged chewing/mashing with complete recollection Bolus transport/lingual motion: Slow tongue motion Oral residue: Trace residue lining oral structures Location of oral residue : Floor of mouth; Tongue; Lateral sulci Initiation of pharyngeal swallow : Valleculae  Pharyngeal Impairment Domain: Pharyngeal Impairment Domain Soft palate elevation: No bolus between soft palate (SP)/pharyngeal wall (PW) Laryngeal elevation: Complete superior  movement of thyroid  cartilage with complete approximation of arytenoids to epiglottic petiole Anterior hyoid excursion: Complete anterior movement Epiglottic movement: Partial inversion Laryngeal vestibule closure: Incomplete, narrow column air/contrast in laryngeal vestibule Pharyngeal stripping wave : Present - diminished Pharyngeal contraction (A/P view only): N/A Pharyngoesophageal segment opening: Complete distension and complete duration, no obstruction of flow Tongue base retraction: Trace column of contrast or air between tongue base and PPW; Narrow column of contrast or air between tongue base and PPW Pharyngeal residue: Collection of residue within or on pharyngeal structures Location of pharyngeal residue: Valleculae  Esophageal Impairment Domain: Esophageal Impairment Domain Esophageal clearance upright position: Complete clearance, esophageal coating Pill: Pill Consistency administered: -- (na) Penetration/Aspiration Scale Score: Penetration/Aspiration Scale Score 1.  Material does not enter airway: Puree; Solid; Moderately thick liquids (Level 3, honey thick) 8.  Material enters airway, passes BELOW cords without attempt by patient to eject out (silent aspiration) : Thin liquids (Level 0); Mildly thick liquids (Level 2, nectar thick) Compensatory Strategies: Compensatory Strategies Compensatory strategies: Yes Straw: -- (no difference) Chin tuck: Ineffective Ineffective Chin Tuck: Mildly thick liquid (Level 2, nectar thick) Left head turn: -- (negligible difference)   General Information: Caregiver present: No  Diet Prior to this Study: Dysphagia 3 (mechanical soft); Thin liquids (Level 0)   Temperature : Normal   Respiratory Status: WFL   Supplemental O2: None (Room air)   History of Recent Intubation: No  Behavior/Cognition: Alert; Cooperative No data recorded Baseline vocal quality/speech: Dysphonic Volitional Cough: Able to elicit Volitional Swallow: Able to elicit No data recorded Goal Planning:  Prognosis for improved oropharyngeal function: Good No data recorded No data recorded Patient/Family Stated Goal: to get pt to eat more, pt with ongoing odynophagia No data recorded Pain: Pain Assessment Pain Assessment: No/denies pain Faces Pain Scale: 0 Pain Location: throat pain - odynophagia Pain Intervention(s): Monitored during session End of Session: Start Time:SLP Start Time (ACUTE ONLY): 1335 Stop Time: SLP Stop Time (ACUTE ONLY): 1410 Time Calculation:SLP Time Calculation (min) (ACUTE ONLY): 35 min Charges: SLP Evaluations $ SLP Speech Visit: 1 Visit SLP Evaluations $MBS Swallow: 1 Procedure $Swallowing Treatment: 1 Procedure $Speech Treatment for Individual: 1 Procedure SLP visit diagnosis: SLP Visit Diagnosis: Dysphagia, oropharyngeal phase (R13.12) Past Medical History: Past Medical History: Diagnosis Date  Anxiety   Arthritis   fingers  Chronic atrial fibrillation (HCC)   Depression   Family history of breast cancer   Family history of cancer of male genital organ   Family history of gene mutation   BRIP1  Family history of  malignant neoplasm of gastrointestinal tract   Hyperlipidemia   Hypertension   Personal history of colonic polyps 11/12/2006  Sleep apnea  Past Surgical History: Past Surgical History: Procedure Laterality Date  2-D echocardiogram  07/22/2010  Ejection fraction greater than 55%. Left atrium moderately dilated. Right atrium moderately dilated. 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 Mild to Moderate MR. Mild to moderate TR.  APPLICATION OF CRANIAL NAVIGATION Right 07/17/2024  Procedure: COMPUTER-ASSISTED NAVIGATION, FOR CRANIAL PROCEDURE;  Surgeon: Rosslyn Dino HERO, MD;  Location: [facility] OR;  Service: Neurosurgery;  Laterality: Right;  CRANIAL NAVIGATION  ATRIAL ABLATION SURGERY  2004  EP IMPLANTABLE DEVICE N/A 02/11/2016  Procedure: Loop Recorder Insertion;  Surgeon: Jerel Balding, MD;  Location: [facility] INVASIVE CV LAB;  Service: Cardiovascular;  Laterality: N/A;  Exercise Myoview stress test   07/08/2000  Nonischemic low-risk.  HIP FRACTURE SURGERY  1970s  IR GENERIC HISTORICAL  01/21/2017  IR RADIOLOGIST EVAL & MGMT 01/21/2017 [facility]-INTER[facility]  STERIOTACTIC STIMULATOR INSERTION Right 07/17/2024  Procedure: OPEN CRANIOTOMY FOR BIOPSY;  Surgeon: Rosslyn Dino HERO, MD;  Location: [facility] OR;  Service: Neurosurgery;  Laterality: Right;  RIGHT STEREOTACTIC BRAIN BIOPSY Vona Alan Bradford 07/28/2024, 3:37 PM Amanda L. Vona, MA CCC/SLP Clinical Specialist - Acute Care SLP Acute Rehabilitation Services Office number [phone]  CT HEAD WO CONTRAST ( ) Result Date: 07/26/2024 EXAM: CT HEAD WITHOUT CONTRAST 07/26/2024 01:39:00 PM TECHNIQUE: CT of the head was performed without the administration of intravenous contrast. Automated exposure control, iterative reconstruction, and/or weight based adjustment of the mA/kV was utilized to reduce the radiation dose to as low as reasonably achievable. COMPARISON: CT head 07/23/2024. CLINICAL HISTORY: Follow up imaging post brain biopsy tract hemorrhage. FINDINGS: BRAIN AND VENTRICLES: Similar appearance of right parietal craniotomy for biopsy of mass lesion. There are decreased hyperdense components of the mass compared to prior, likely reflecting decreased postbiopsy blood products. Similar vasogenic edema in the right parietal lobe. Additional vasogenic edema in the posterior right temporal lobe also similar to prior. 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 Similar mass effect on the right lateral ventricle. No acute hemorrhage. No evidence of acute infarct. No hydrocephalus. No extra-axial collection. No midline shift. ORBITS: No acute abnormality. SINUSES: No acute abnormality. SOFT TISSUES AND SKULL: No acute soft tissue abnormality. No skull fracture. IMPRESSION: 1. Similar appearance of right parietal craniotomy for biopsy of mass lesion. There are decreased hyperdense components within the mass compatible with decreased post-biopsy blood products. 2.  Similar vasogenic edema and mass effect. Electronically signed by: Donnice Mania MD 07/26/2024 04:12 PM EDT RP Workstation: [id number]   CUP PACEART REMOTE DEVICE CHECK Result Date: 07/25/2024 ILR summary report received. Battery status OK. Normal device function. No new symptom, tachy, brady, or pause episodes. No new AF episodes. Presenting EGM with irregular R-R intervals and indiscernible P waves c/w hx of permanent  atrial arrhythmia, V rates are controlled, on Eliquis  per Epic. Monthly summary reports and ROV/PRN. [facility], [facility]  VAS US  LOWER EXTREMITY VENOUS (DVT) Result Date: 07/24/2024  Lower Venous DVT Study Patient Name:  Wayne Molina  Date of Exam:   07/23/2024 Medical Rec #: [id number]      Accession #:    [id number] Date of Birth: [birth year]       Patient Gender: M Patient Age:   80 years Exam Location:  [facility] Procedure:      VAS US  LOWER EXTREMITY VENOUS (DVT) Referring Phys: CARINA BROWN --------------------------------------------------------------------------------  Indications: Pain.  Risk Factors: Recent cancer diagnosis (GDM). Comparison Study:  Previous exam (LLEV) on 08/15/2020 was negative for DVT Performing Technologist: Ezzie Potters RVT, RDMS  Examination Guidelines: A complete evaluation includes B-mode imaging, spectral Doppler, color Doppler, and power Doppler as needed of all accessible portions of each vessel. Bilateral testing is considered an integral part of a complete examination. Limited examinations for reoccurring indications may be performed as noted. The reflux portion of the exam is performed with the patient in reverse Trendelenburg.  +---------+---------------+---------+-----------+----------+--------------+ RIGHT    CompressibilityPhasicitySpontaneityPropertiesThrombus Aging +---------+---------------+---------+-----------+----------+--------------+ CFV      Full           Yes      Yes                                  +---------+---------------+---------+-----------+----------+--------------+ SFJ      Full                                                        +---------+---------------+---------+-----------+----------+--------------+ FV Prox  Full           Yes      Yes                                 +---------+---------------+---------+-----------+----------+--------------+ FV Mid   Full           Yes      Yes                                 +---------+---------------+---------+-----------+----------+--------------+ FV DistalFull           Yes      Yes                                 +---------+---------------+---------+-----------+----------+--------------+ PFV      Full                                                        +---------+---------------+---------+-----------+----------+--------------+ POP      Full           Yes      Yes                                 +---------+---------------+---------+-----------+----------+--------------+ PTV      Full                                                        +---------+---------------+---------+-----------+----------+--------------+ PERO     Full                                                        +---------+---------------+---------+-----------+----------+--------------+  Gastroc  None           No       No                   Acute          +---------+---------------+---------+-----------+----------+--------------+   +---------+---------------+---------+-----------+----------+--------------+ LEFT     CompressibilityPhasicitySpontaneityPropertiesThrombus Aging +---------+---------------+---------+-----------+----------+--------------+ CFV      Full           Yes      Yes                                 +---------+---------------+---------+-----------+----------+--------------+ SFJ      Full                                                         +---------+---------------+---------+-----------+----------+--------------+ FV Prox  Full           Yes      Yes                                 +---------+---------------+---------+-----------+----------+--------------+ FV Mid   Full           Yes      Yes                                 +---------+---------------+---------+-----------+----------+--------------+ FV DistalFull           Yes      Yes                                 +---------+---------------+---------+-----------+----------+--------------+ PFV      Full                                                        +---------+---------------+---------+-----------+----------+--------------+ POP      Full           Yes      Yes                                 +---------+---------------+---------+-----------+----------+--------------+ PTV      Full                                                        +---------+---------------+---------+-----------+----------+--------------+ PERO     Full                                                        +---------+---------------+---------+-----------+----------+--------------+ Gastroc  Full                                                        +---------+---------------+---------+-----------+----------+--------------+  Summary: BILATERAL: - No evidence of deep vein thrombosis seen in the lower extremities, bilaterally. -No evidence of popliteal cyst, bilaterally. RIGHT: Findings consistent with acute intramuscular thrombosis involving the right gastrocnemius veins.   *See table(s) above for measurements and observations. Electronically signed by Debby Robertson on 07/24/2024 at 11:33:00 AM.    Final    CT HEAD WO CONTRAST ( ) Result Date: 07/23/2024 EXAM: CT HEAD WITHOUT CONTRAST 07/23/2024 03:38:00 PM TECHNIQUE: CT of the head was performed without the administration of intravenous contrast. Automated exposure control, iterative reconstruction, and/or  weight based adjustment of the mA/kV was utilized to reduce the radiation dose to as low as reasonably achievable. COMPARISON: CT angio of head and neck 10/04/2023 CLINICAL HISTORY: Brain/CNS neoplasm, monitor. Non con. Principal Problem: GBM (glioblastoma multiforme) (HCC). Chief complaints: Weakness, Near Syncope, Code Stroke. Status post biopsy. FINDINGS: BRAIN AND VENTRICLES: Right parietal craniotomy for biopsy of the mass is again noted. The operative bed is unchanged. More hyperdense component extending into the right posterior aspect of the corpus callosum with stable. This creates mass effect on the right lateral ventricle. Vasogenic edema in the high right parietal lobe is stable. ORBITS: No acute abnormality. SINUSES: No acute abnormality. SOFT TISSUES AND SKULL: No acute soft tissue abnormality. No skull fracture. IMPRESSION: 1. Stable right parietal operative bed and mass with more hyperdense component extending into the right posterior aspect of the corpus callosum, creating mass effect on the right lateral ventricle. 2. Stable vasogenic edema in the high right parietal lobe. Electronically signed by: Lonni Necessary MD 07/23/2024 03:49 PM EDT RP Workstation: [id number]   US  Abdomen Limited RUQ (LIVER/GB) Result Date: 07/23/2024 CLINICAL DATA:  Right upper quadrant abdominal pain EXAM: ULTRASOUND ABDOMEN LIMITED RIGHT UPPER QUADRANT COMPARISON:  None Available. FINDINGS: Gallbladder: Borderline gallbladder wall thickening may be due to nondistention. No sonographic Murphy sign noted by sonographer. Common bile duct: Diameter: 0.4 cm Liver: No focal lesion identified. Nodular liver contour raises suspicion for cirrhosis. Portal vein is patent on color Doppler imaging with normal direction of blood flow towards the liver. Other: None. IMPRESSION: 1. Nodular liver contour raises suspicion for cirrhosis. 2. Borderline gallbladder wall thickening may be due to nondistention. No sonographic Murphy  sign noted by sonographer. Electronically Signed   By: Ryan Salvage M.D.   On: 07/23/2024 15:48   Overnight EEG with video Result Date: 07/22/2024 Wayne Arlin KIDD, MD     07/23/2024  7:03 AM Patient Name: Wayne Molina MRN: [id number] Epilepsy Attending: Arlin Molina Wayne Referring Physician/Provider: Khaliqdina, Salman, MD Duration: 07/21/2024 1227 to 07/22/2024 1227  Patient history: 68 y.o. 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 EEG to evaluate for seizure  Level of alertness: awake/ lethargic , asleep  AEDs during EEG study: LEV  Technical aspects: This EEG study was done with scalp electrodes positioned according to the 10-20 International system of electrode placement. Electrical activity was reviewed with band pass filter of 1-70Hz , sensitivity of 7 uV/mm, display speed of 69mm/sec with a 60Hz  notched filter applied as appropriate. EEG data were recorded continuously and digitally stored.  Video monitoring was available and reviewed as appropriate.  Description: The posterior dominant rhythm consists of 7.5 Hz activity of moderate voltage (25-35 uV) seen predominantly in posterior head regions, asymmetric ( right<left) and reactive to eye opening and eye closing. Sleep was characterized by vertex waves, sleep spindles (12 to 14 Hz), maximal  frontocentral region. EEG showed continuous 3 to 6 Hz theta-delta slowing right hemisphere, maximal right temporal region. Intermittent generalized 3-5hz  theta-delta slowing was also noted. Hyperventilation and photic stimulation were not performed.    ABNORMALITY - Continuous slow, right hemisphere - Intermittent slow, generalized  IMPRESSION: This study is suggestive of cortical dysfunction arising from right hemisphere likely due to underlying structural abnormality. Additionally there is mild to moderate diffuse encephalopathy. No definite seizures were seen throughout the  recording.   Arlin MALVA Krebs   DG CHEST PORT 1 VIEW Result Date: 07/21/2024 CLINICAL DATA:  68 year old male with cough.  Grade 4 Glioma. EXAM: PORTABLE CHEST 1 VIEW COMPARISON:  Portable chest 07/17/2024 and earlier. FINDINGS: Portable AP semi upright view at 0656 hours. Stable cardiomegaly and mediastinal contours. Stable left chest superficial cardiac device. Enteric feeding tube courses to the abdomen, tip not included. Lung volumes are within normal limits. Allowing for portable technique the lungs are clear. Visualized tracheal air column is within normal limits. Stable visualized osseous structures. IMPRESSION: No acute cardiopulmonary abnormality. Electronically Signed   By: VEAR Hurst M.D.   On: 07/21/2024 10:48   CT ANGIO HEAD NECK W WO CM Result Date: 07/21/2024 CLINICAL DATA:  Initial evaluation for acute neuro deficit, stroke. EXAM: CT ANGIOGRAPHY HEAD AND NECK TECHNIQUE: Multidetector CT imaging of the head and neck was performed using the standard protocol during bolus administration of intravenous contrast. Multiplanar CT image reconstructions and MIPs were obtained to evaluate the vascular anatomy. Carotid stenosis measurements (when applicable) are obtained utilizing NASCET criteria, using the distal internal carotid diameter as the denominator. RADIATION DOSE REDUCTION: This exam was performed according to the departmental dose-optimization program which includes automated exposure control, adjustment of the mA and/or kV according to patient size and/or use of iterative reconstruction technique. CONTRAST:  75mL OMNIPAQUE  IOHEXOL  350 MG/ML SOLN COMPARISON:  Prior CT from earlier the same day. FINDINGS: CT HEAD FINDINGS Brain: Examination degraded by motion artifact. Patient's right cerebral mass with superimposed hemorrhage again noted, not significantly changed. No new finding from CT performed earlier the same day. Vascular: No visible hyperdense vessel. Skull: No visible new finding.  Sinuses/Orbits: No visible new finding. Other: None. Review of the MIP images confirms the above findings CTA NECK FINDINGS Aortic arch: Aortic arch and origin of the great vessels not visualized on this exam. Atheromatous change about the visualized proximal great vessels noted. No visible stenosis. Right carotid system: Right common and internal carotid arteries are patent without stenosis or dissection. Left carotid system: Left common and internal carotid arteries are patent without dissection. Eccentric calcified plaque at the left carotid bulb with estimated 50% stenosis, stable. 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 Distal reconstitution at the V3 segment via muscular collaterals, stable. Vertebral arteries remain patent as they course into the cranial vault. No visible stenosis or dissection. Skeleton: No worrisome osseous lesions.  Patient is edentulous. Other neck: No other acute finding. Upper chest: No other acute finding. Review of the MIP images confirms the above findings CTA HEAD FINDINGS Anterior circulation: Both internal carotid arteries are patent to the termini without stenosis. A1 segments patent bilaterally. Right A1 hypoplastic. Normal anterior communicating artery complex. Anterior cerebral arteries patent without stenosis. Atheromatous plaque at the right ICA terminus/proximal right M1 segment with short-segment mild-to-moderate stenosis (series 10, image 111). Left M1 segment widely patent. No proximal MCA branch occlusion. Distal MCA branches perfused and symmetric. Posterior circulation: Right  V4 segment patent without stenosis. Right PICA not seen. Left vertebral artery patent to the takeoff of the left PICA, with subsequent reocclusion. Left PICA patent. Basilar diminutive but patent without stenosis. Superior cerebellar and posterior cerebral arteries remain patent bilaterally. Venous sinuses: Grossly  patent allowing for timing the contrast bolus. Anatomic variants: As above.  No aneurysm. Review of the MIP images confirms the above findings IMPRESSION: 1. Stable CTA of the head and neck. No large vessel occlusion or other emergent finding. 2. 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 Left V4 segment reoccludes beyond the takeoff of the left PICA. 3. Atheromatous plaque at the right ICA terminus/proximal right M1 segment with short-segment mild-to-moderate stenosis. 4. Atheromatous plaque at the left carotid bulb with estimated 50% stenosis, stable. 5. Right cerebral mass with superimposed hemorrhage, not significantly changed from CT performed earlier the same day. Electronically Signed   By: Morene Hoard M.D.   On: 07/21/2024 00:03   CT Head Wo Contrast Result Date: 07/20/2024 CLINICAL DATA:  Initial evaluation for acute neuro deficit, stroke suspected. EXAM: CT HEAD WITHOUT CONTRAST TECHNIQUE: Contiguous axial images were obtained from the base of the skull through the vertex without intravenous contrast. RADIATION DOSE REDUCTION: This exam was performed according to the departmental dose-optimization program which includes automated exposure control, adjustment of the mA and/or kV according to patient size and/or use of iterative reconstruction technique. COMPARISON:  Prior CT from 07/18/2024. FINDINGS: Brain: Postoperative changes from prior right temporal craniotomy for tumor biopsy again noted. Postoperative pneumocephalus has resolved. Previously identified infiltrating mass within the right cerebral hemisphere again seen, grossly stable. Focus of hemorrhage within the mass itself has slightly contracted in the interim, now measuring 1.6 x 1.1 x 1.3 cm, previously 1.9 x 1.2 x 1.5 cm. Trace residual hemorrhage along the biopsy tract noted as well, decreased. Similar surrounding vasogenic edema and  regional mass effect without significant midline shift. No other new acute intracranial hemorrhage. No visible acute large vessel territory infarct. No other mass lesion or mass effect. No hydrocephalus. No significant extra-axial fluid collection. Vascular: No abnormal hyperdense vessel. Calcified atherosclerosis present at the skull base. Skull: Post craniotomy changes on the right. Skin staples remain in place. Sinuses/Orbits: Globes and orbital soft tissues demonstrate no acute finding. Paranasal sinuses and mastoid air cells remain largely clear. Other: Nasogastric tube in place. IMPRESSION: 1. Postoperative changes from prior right temporal craniotomy for tumor biopsy. Previously identified infiltrating mass within the right cerebral hemisphere is grossly stable. Focus of hemorrhage within the mass itself has slightly contracted in the interim, now measuring 1.6 x 1.1 x 1.3 cm, previously 1.9 x 1.2 x 1.5 cm. Trace residual hemorrhage along the biopsy tract has decreased as well. Similar surrounding vasogenic edema and regional mass effect without significant midline shift. 2. No other new acute intracranial abnormality. Electronically Signed   By: Morene Hoard M.D.   On: 07/20/2024 21:01   CT HEAD WO CONTRAST Result Date: 07/18/2024 CLINICAL DATA:  Initial postoperative evaluation. EXAM: CT HEAD WITHOUT CONTRAST TECHNIQUE: Contiguous axial images were obtained from the base of the skull through the vertex without intravenous contrast. RADIATION DOSE REDUCTION: This exam was performed according to the departmental dose-optimization program which includes automated exposure control, adjustment of the mA and/or kV according to patient size and/or use of iterative reconstruction technique. COMPARISON:  Comparison made with prior CT from 07/08/2024 as well as previous exams. FINDINGS: Brain:  Postoperative changes from interval right temporal craniotomy for presumed biopsy of previously identified  infiltrating right cerebral mass. Small amount of postoperative pneumocephalus overlies the right cerebral convexity. Focus of hemorrhage measuring up to 1.4 cm seen along the biopsy tract at the posterior right temporal lobe (series 2, image 15). Additional focus of hemorrhage within the resection cavity itself measures 1.9 x 1.2 x 1.5 cm (series 2, image 18). No other complicating features. The underlying infiltrating mass involving the right temporal lobe with extension into the right splenium is otherwise grossly stable. Surrounding vasogenic edema with mass effect on the posterior right lateral ventricle, stable. No midline shift. Basilar cisterns remain patent. Remainder of the brain is otherwise stable. No other acute intracranial hemorrhage. No acute large vessel territory infarct. No other mass lesion or mass effect. No hydrocephalus or extra-axial fluid collection. Vascular: No abnormal hyperdense vessel. Calcified atherosclerosis present at the skull base. Skull: Post craniotomy changes on the right. Skin staples in subgaleal drain remain in place. No adverse features. Sinuses/Orbits: Globes orbital soft tissues demonstrate no acute finding. Paranasal sinuses are largely clear. Small right with trace left mastoid effusions, of doubtful significance. Nasogastric tube in place. Other: None. IMPRESSION: 1. Postoperative changes from interval right temporal craniotomy for biopsy of patient's known right cerebral mass. Small volume postoperative hemorrhage along the biopsy tract and within the resection cavity as above. No other complicating features. The underlying right cerebral mass is otherwise grossly stable. 2. No other new acute intracranial abnormality. Electronically Signed   By: Morene Hoard M.D.   On: 07/18/2024 02:12   DG CHEST PORT 1 VIEW Result Date: 07/17/2024 CLINICAL DATA:  33498 Respiratory failure (HCC) 33498. EXAM: PORTABLE CHEST 1 VIEW COMPARISON:  07/13/2024. FINDINGS: There is  nonspecific heterogeneous opacity overlying the lateral aspect of the right mid lung zone, which was likely present on the prior radiograph. This is indeterminate. Bilateral lung fields are otherwise clear. No acute consolidation or lung collapse. Bilateral costophrenic angles are clear. Stable cardio-mediastinal silhouette. Presumed loop recorder device noted overlying the left lower chest. No acute osseous abnormalities. The soft tissues are within normal limits. Enteric tube is seen coursing below the left hemidiaphragm however, the tip and side hole are not included in the film. IMPRESSION: *No acute cardiopulmonary abnormality. Nonspecific heterogeneous opacity overlying the lateral aspect of the right mid lung zone, which was likely present on the prior radiograph. This is indeterminate. Correlate clinically to determine the need for further evaluation with nonemergent chest CT scan. Electronically Signed   By: Ree Molt M.D.   On: 07/17/2024 08:18   DG Chest Port 1 View Result Date: 07/13/2024 CLINICAL DATA:  Respiratory distress. EXAM: PORTABLE CHEST 1 VIEW COMPARISON:  Chest radiograph dated 07/11/2024 FINDINGS: Enteric tube extends below the diaphragm with tip beyond the inferior margin of the image. Right-sided PICC with tip over central SVC. There is cardiomegaly with vascular congestion and edema, significantly progressed since the prior radiograph. Pneumonia is not excluded. A small left pleural effusion may be present. No pneumothorax. A loop recorder device. No acute osseous pathology. IMPRESSION: Cardiomegaly with findings of CHF, significantly progressed since the prior radiograph. Pneumonia is not excluded. Electronically Signed   By: Vanetta Chou M.D.   On: 07/13/2024 19:24      IMPRESSION/PLAN:    ICD-10-CM   1. Glioblastoma of parietal lobe (HCC)  C71.3 LORazepam  (ATIVAN ) 0.5 MG tablet    DISCONTINUED: LORazepam  (ATIVAN ) 0.5 MG tablet       Today,  I talked to the patient  and his significant other about the findings and work-up thus far.  His wife provided most of the history but I could hear the patient speaking in the background at times, supplementing her responses.  We discussed the patient's diagnosis of high grade glioma and general treatment for this, highlighting the role of radiotherapy in the management.  We discussed the available radiation techniques, and focused on the details of logistics and delivery.    We discussed the risks, benefits, and side effects of a 3 week course of palliative IMRT partial brain radiotherapy.  I think this is an appropriate approach given his performance status and lack of resectability of the tumor.  We reviewed the side effects may include but not necessarily be limited to: hair loss, scalp irritation, fatigue, cognitive decline, brain injury. No guarantees of treatment were given. A consent form will be signed when he comes in for radiation planning and placed in the patient's medical record. The patient was encouraged to ask questions that I answered to the best of my ability.     We will arrange CT simulation in the near future.  I have prescribed lorazepam  to help with claustrophobia and anxiety during treatment.    This encounter was provided by telemedicine platform; patient desired telemedicine during pandemic precautions.  MyChart video was not available and therefore telemedicine was used. The patient has given verbal consent for this type of encounter and has been advised to only accept a meeting of this type in a secure network environment. On date of service, in total, I spent 30 minutes on this encounter.   The attendants for this meeting include Lauraine Golden  and Terril LITTIE Sharps During the encounter, Lauraine Golden was located at [facility] Department.  Terril LITTIE Sharps was located at home.     __________________________________________   Lauraine Golden, MD  This document serves as  a record of services personally performed by Lauraine Golden, MD. It was created on her behalf by Dorthy Fuse, a trained medical scribe. The creation of this record is based on the scribe's personal observations and the provider's statements to them. This document has been checked and approved by the attending provider.

## 2024-08-11 NOTE — Telephone Encounter (Signed)
 S/w [name]- informed her that I spoke with Dr Francyne and he said Not to restart Losartan . Instructed her to keep a daily log of bp- check 1-2 hrs after meds. Asked her to send weekly logs, please.   She said she and the patient are not active with MyChart. Sent a link to the pt's email to change mychart password and gave her the username. Also gave her the office number.  Asked her to notify us  if his bp is consistently 130-140/80-90 or higher; also notify us  if his BP, top number is less than 100. She verbalized understanding.

## 2024-08-11 NOTE — Telephone Encounter (Signed)
 Received call from Signe ALMETA Cella regarding concerns with patient.   Reports that BP was elevated throughout yesterday's visit. 160/100, 119/95, and 150/93. He also reports elevated resting HR of 102 that went up to 120's with exertion. Patient remained asymptomatic throughout visit.  Patient is compliant on carvedilol  12.5 mg that is being managed by cardiology.   2.  Signe also reports slight skin break down to gluteal cleft, no signs of infection.   3. States that patient has history of sleep apnea, however, does not have CPAP machine. Patient is interested in getting this set up.   4. Requesting verbal orders for PT 1:4. Also requesting orders for RN and SW evaluations.  Verbal orders provided per protocol.   Called patient and LVM requesting that he return call to office in order to schedule follow up appointment.   When patient calls back, please assist in scheduling follow up.   Chiquita JAYSON English, RN

## 2024-08-11 NOTE — Telephone Encounter (Signed)
 Please restart his losartan . He was taking 100 mg daily before the hospitalization, in addition to the carvedilol .

## 2024-08-11 NOTE — Progress Notes (Signed)
 [facility] Clinical Social Work  Initial Assessment   Wayne Molina is a 68 y.o. year old male contacted caregiver by phone. Clinical Social Work was referred by medical provider for assessment of psychosocial needs.   SDOH (Social Determinants of Health) assessments performed: Yes SDOH Interventions    Flowsheet Row Clinical Support from 04/16/2023 in [facility] [place] Clinical Support from 06/18/2021 in [facility] - [facility] Of [place]  SDOH Interventions    Food Insecurity Interventions Intervention Not Indicated --  Housing Interventions Intervention Not Indicated --  Transportation Interventions Intervention Not Indicated --  Utilities Interventions Intervention Not Indicated --  Alcohol Usage Interventions Intervention Not Indicated (Score <7) --  Depression Interventions/Treatment  -- PHQ2-9 Score <4 Follow-up Not Indicated  Financial Strain Interventions Intervention Not Indicated --  Physical Activity Interventions Intervention Not Indicated --  Stress Interventions Intervention Not Indicated --  Social Connections Interventions Intervention Not Indicated --    SDOH Screenings   Food Insecurity: No Food Insecurity (03/22/2024)  Housing: Low Risk  (03/22/2024)  Transportation Needs: No Transportation Needs (03/22/2024)  Utilities: Not At Risk (04/16/2023)  Alcohol Screen: Low Risk  (04/16/2023)  Depression (PHQ2-9): Low Risk  (08/10/2024)  Financial Resource Strain: Low Risk  (03/22/2024)  Physical Activity: Sufficiently Active (03/22/2024)  Social Connections: Unknown (03/22/2024)  Stress: No Stress Concern Present (03/22/2024)  Tobacco Use: Medium Risk (08/04/2024)    PHQ 2/9:    08/10/2024   10:43 AM 03/23/2024    4:02 PM 04/16/2023    3:17 PM  Depression screen PHQ 2/9  Decreased Interest 0 0 0  Down, Depressed, Hopeless 0 1 0  PHQ - 2 Score 0 1 0  Altered sleeping  1   Tired, decreased energy   1   Change in appetite  0   Feeling bad or failure about yourself   0   Trouble concentrating  0   Moving slowly or fidgety/restless  0   Suicidal thoughts  0   PHQ-9 Score  3   Difficult doing work/chores  Somewhat difficult      Distress Screen completed: No     No data to display            Family/Social Information:  Housing Arrangement: patient lives with his wife.  Per pt's wife Janese) the two have been together for 10 years and recently married in light of pt's health issues.  Pt's wife is worried about having the strength to provide the level of support pt needs at this time.  Pt was discharged earlier than initially planned from acute rehab to accommodate oncology appointment.  Home care is in place.  CSW advised pt's wife to communicate her needs to the home care team.  If pt's care needs exceed what she is able to provide pt may need to go to a skilled nursing facility. Family members/support persons in your life? Pt's wife will be his primary source of support.  [name]'s daughter assists as she is able and pt has a daughter, sister and brother who can assist in a limited capacity. Transportation concerns: not at this time; however, if pt's wife is unable to get pt in and out of the car it will become an issue.  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  Income source: Employment Financial concerns: No, pt's wife states pt was taking care of the finances  prior to hospitalization.  She is unaware of any concerns, but not sure of their finances. Type of concern: None Food access concerns: no Religious or spiritual practice: Yes-Holiness Advanced directives: Yes-scanned to chart Services Currently in place:  home care  Coping/ Adjustment to diagnosis: Patient understands treatment plan and what happens next? yes Concerns about diagnosis and/or treatment: Losing my job and/or losing income, Overwhelmed by information, How I will pay for the  services I need, and Quality of life Patient reported stressors: Anxiety/ nervousness, Adjusting to my illness, Facing my mortality, and Physical issues Hopes and/or priorities: pt's priority is to start treatment w/ the hope of positive results. Patient enjoys not discussed Current coping skills/ strengths: Motivation for treatment/growth  and Supportive family/friends     SUMMARY: Current SDOH Barriers:  No barriers identified at this time.  Clinical Social Work Clinical Goal(s):  No clinical social work goals at this time  Interventions: Discussed common feeling and emotions when being diagnosed with cancer, and the importance of support during treatment Informed patient of the support team roles and support services at [facility] Provided CSW contact information and encouraged patient to call with any questions or concerns CSW informed of financial support options available should pt's wife find there are financial concerns.  Brain tumor assessments deferred at this time until pt is more able to participate.   Follow Up Plan: Patient will contact CSW with any support or resource needs Patient verbalizes understanding of plan: Yes    Devere JONELLE Manna, LCSW Clinical Social Worker [facility]

## 2024-08-11 NOTE — Telephone Encounter (Signed)
  Francyne Headland, MD  Physician Cardiology   Telephone Encounter Signed   Creation Time: 08/11/2024 11:33 AM   Signed     Please restart his losartan . He was taking 100 mg daily before the hospitalization, in addition to the carvedilol .          S/w Shelia (hcpoa) Gave her the information above. She reports that today when she checked his bp few hours after taking morning meds, his bp was 106/73 and then when occupational heath came out, it was just about the same.   Informed her to continue to monitor BP. I will give Dr Francyne this information and see if he wants to proceed with the plan of care, or change it. Informed her that I would call her back soon. She verbalized understanding.

## 2024-08-14 ENCOUNTER — Telehealth: Payer: Self-pay

## 2024-08-14 ENCOUNTER — Other Ambulatory Visit (HOSPITAL_COMMUNITY): Payer: Self-pay

## 2024-08-14 ENCOUNTER — Ambulatory Visit: Admitting: Family Medicine

## 2024-08-14 VITALS — BP 128/107 | HR 80 | Ht 68.0 in

## 2024-08-14 DIAGNOSIS — R0602 Shortness of breath: Secondary | ICD-10-CM

## 2024-08-14 DIAGNOSIS — G4733 Obstructive sleep apnea (adult) (pediatric): Secondary | ICD-10-CM | POA: Diagnosis not present

## 2024-08-14 DIAGNOSIS — C713 Malignant neoplasm of parietal lobe: Secondary | ICD-10-CM | POA: Diagnosis not present

## 2024-08-14 NOTE — Assessment & Plan Note (Signed)
 STOP-BANG positive at score of 5 and with past history of OSA.  Reasonable to pursue sleep testing and CPAP titration at this time -Split-night study ordered, will likely need PA

## 2024-08-14 NOTE — Progress Notes (Signed)
 SUBJECTIVE:   CHIEF COMPLAINT / HPI:  Wayne Molina is a 68 y.o. 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  Patient was hospitalized from 8/28 - 9/26 with FMTS for seizures 2/2 glioblastoma multiforme.  He was then hospitalized with CIR until 10/1.  Currently completing dexamethasone  taper and should be at 2 mg Q12h since 10/1, will taper to 2 mg once daily on 10/15.  Planned to follow with Palliative Care outpatient.  Currently, patient endorses constant dyspnea, feeling like he cannot catch his breath.  This is not associated with exertion.  Dyspnea is worse when lying flat.  Sleeps with 2 pillows and likes to sleep in recliner.  Patient reports that these symptoms began at the same time as his hospitalization started in August but have not overall changed throughout his hospitalization and since discharge.  Notably, patient was ambulatory and active prior to his hospitalization and glioblastoma diagnosis in August.  He did not have any dyspnea before being hospitalized.  The patient's wife called the clinic today requesting a CPAP reporting worsening dyspnea since patient's hospitalization. Patient reportedly has a diagnosis of OSA, but has not used CPAP in multiple years due to not tolerating its sounds.  Patient is doing home health PT and OT at home. Patient endorses having and incentive spirometer at home, but states he only uses it approximately once weekly.  Patient is still taking Eliquis  5 mg BID for DVT on 9/14, has not missed any doses.  STOP-BANG Score for OSA (Y=+1; >=3 is positive result) Snores loudly? +1 Tired, fatigued, or sleepy during the day time? 0 Observed stop breathing at night? +1 High blood pressure? +1 BMI >35? 0 AGE >50? +1 Neck circumference >40 cm? 0 Gender male? +1 SCORE: 5   PERTINENT PMH / PSH: Glioblastoma  multiforme, following with Neuro and Oncology Seizure history on Keppra   *Remainder reviewed in problem list.   OBJECTIVE:   BP (!) 128/107   Pulse 80   Ht 5' 8 (1.727 m)   SpO2 100%   BMI 23.39 kg/m   General: Appears older than stated age, resting in wheelchair with lap belt place, NAD, alert and at baseline. HEENT: Head: Normocephalic, atraumatic. No tenderness to percussion over sinuses. Eyes: PERRLA. No conjunctival erythema or scleral injections. Nose: Non-erythematous turbinates. No rhinorrhea. Mouth/Oral: Clear, no tonsillar exudate. MMM.  Patient is quite hoarse always, was intubated 8/29-9/4. Neck: Supple. No LAD.  No JVD. Cardiovascular: Regular rate and rhythm. Normal S1/S2. No murmurs, rubs, or gallops appreciated. 2+ radial pulses. Pulmonary: Clear bilaterally to ascultation. No wheezes, crackles, or rhonchi. Normal WOB on room air. No accessory muscle use. Abdominal: No tenderness to deep or light palpation. No rebound or guarding. No HSM. Skin: Warm and dry. Extremities: No peripheral edema bilaterally. Capillary refill <2 seconds. Neuro: Quiet voice but no dysarthria.  CNs 2-12 without deficit.  Strength 5/5 in all extremities bilaterally but unable to ambulate and wheelchair dependent.   ASSESSMENT/PLAN:   Assessment & Plan Shortness of breath Suspect multifactorial including possible OSA component as well as atelectasis after prolonged hospitalization.  Reassuringly, patient is oxygenating well on room air and has no respiratory distress.  No infectious symptoms or evidence of pneumonia on exam.  TTE was normal with EF 60-65% on 8/29 at time of recent hospitalization and patient has no evidence of volume overload on exam and without recent  weight gain.  Patient has no concurrent chest pain and dyspnea has been persisting for several weeks overall unchanged, no concern for ACS.  Patient did have DVT while hospitalized, but remains on Eliquis  and has not missed any  doses.  No concern for PE. - Instructed patient to do incentive spirometry for minimum of 4-5 times daily, reviewed technique for using spirometer extensively - Return precautions if dyspnea worsening or unable to catch breath Obstructive sleep apnea syndrome STOP-BANG positive at score of 5 and with past history of OSA.  Reasonable to pursue sleep testing and CPAP titration at this time -Split-night study ordered, will likely need PA  Return in about 3 months (around 11/14/2024) for PCP follow up.  Wayne Cull Toma, MD [facility]

## 2024-08-14 NOTE — Telephone Encounter (Signed)
 Patients wife calls nurse line requesting a cpap.  She reports since his hospitalization he has had increased shortness of breath.   She reports he was diagnosed with sleep apnea, however has not used a cpap in a while due to not tolerating the sound.   She reports he is able to speak in full sentences and is not in acute distress at this time.   She denies any dizziness, chest pains, headaches or vision changes.   Patient scheduled for this afternoon for evaluation.   Strict ED precautions discussed with wife in the meantime.

## 2024-08-14 NOTE — Patient Instructions (Signed)
 It was great to see you today! Thank you for choosing [facility] for your primary care.  Today we addressed: 1. Obstructive sleep apnea syndrome I have placed a referral for a sleep study.  They will call you to set up an appointment. Please give us  a call if you don't hear back in the next 2 weeks.  2. Lung atelectasis/compression Please use your incentive spirometer 4-5 times daily at regular intervals.  Please remember to breath in steadily and keep the float valve BETWEEN the lines indicated on the incentive spirometry cylinder.  A hard fast deep breath is not the goal.  The goal is a slow, steady, deep breath.  You should return to our clinic in 3 months for follow up.  Thank you for coming to see us  at [facility] and for the opportunity to care for you! Travonna Swindle, MD 08/14/2024, 2:38 PM

## 2024-08-15 ENCOUNTER — Encounter: Admitting: Neurosurgery

## 2024-08-15 ENCOUNTER — Other Ambulatory Visit (HOSPITAL_COMMUNITY): Payer: Self-pay

## 2024-08-16 ENCOUNTER — Other Ambulatory Visit (HOSPITAL_COMMUNITY): Payer: Self-pay

## 2024-08-16 ENCOUNTER — Other Ambulatory Visit: Payer: Self-pay

## 2024-08-16 NOTE — Telephone Encounter (Signed)
 Oral Chemotherapy Pharmacist Encounter  Patient Education I spoke with patient's wife for overview of new oral chemotherapy medication: Temodar (temozolomide) for the treatment of right parietal glioblastoma, IDH-wildtype, in conjunction with radiation, planned duration 21 days.   Treatment goal: Palliative  Counseled patient's wife on administration, dosing, side effects, monitoring, drug-food interactions, safe handling, storage, and disposal.  Patient will take temozolomide 140mg  capsule, 1 capsule (140 mg total) by mouth daily for 21 days. Take this medication on an empty stomach (1 hour before a meal or 2 hours after a meal) in the evening at bedtime to decrease nausea/vomiting.   Start date: August 22, 2024 PM (patient's wife knows that patient should start temozolomide the night prior to first radiation treatment)  Side effects include but not limited to: decreased blood counts, fatigue, nausea/vomiting, constipation.    Reviewed importance of keeping a medication schedule and plan for any missed doses.  After discussion no patient barriers to medication adherence identified.   Distress thermometer flowsheet: Distress thermometer completed during telephone call and reviewed with patient's wife. Due to score, social work referral has not been sent.   Communication and Learning Assessment Primary learner: patient's wife Barriers to learning: No barriers Preferred language: English Learning preferences: Listening Reading  Patient's wife voiced understanding and appreciation. All questions answered. Medication handout provided.  Provided patient with Oral Chemotherapy Navigation Clinic phone number. Patient's wife knows to call the office with questions or concerns.  Dionicia Canavan, PharmD, RPh PGY1 Acute Care Pharmacy Resident [facility]  08/16/2024 2:33 PM

## 2024-08-16 NOTE — Progress Notes (Signed)
 Oral Chemotherapy Pharmacist Encounter  Patient's wife was counseled under telephone encounter from 08/10/24. Will not set up f/u call as patient will only be taking 21 days of temozolomide with radiation.   [name], PharmD, BCPS, BCOP Hematology/Oncology Clinical Pharmacist [facility] Oral Chemotherapy Navigation Clinics [phone] 08/16/2024 2:32 PM

## 2024-08-16 NOTE — Progress Notes (Signed)
 Specialty Pharmacy Initial Fill Coordination Note  Wayne Molina is a 68 y.o. male contacted today regarding refills of specialty medication(s) Temozolomide (TEMODAR) .  Patient requested Delivery  on 08/18/24  to verified address [address]   [place] [address]   Medication will be filled on 08/17/24.   Patient is aware of $0.00 copayment.    Lucie Lamer, CPhT [place]  [facility] Specialty Pharmacy Services Oncology Pharmacy Patient Advocate Specialist II THER[place] [place] Phone: [phone]  Fax: [phone] [email or web address]

## 2024-08-17 ENCOUNTER — Other Ambulatory Visit: Payer: Self-pay

## 2024-08-17 ENCOUNTER — Telehealth: Payer: Self-pay | Admitting: Internal Medicine

## 2024-08-17 ENCOUNTER — Ambulatory Visit
Admission: RE | Admit: 2024-08-17 | Discharge: 2024-08-17 | Disposition: A | Discharge status: Home / Self Care | Source: Ambulatory Visit | Attending: Radiation Oncology | Admitting: Radiation Oncology

## 2024-08-17 DIAGNOSIS — C713 Malignant neoplasm of parietal lobe: Secondary | ICD-10-CM | POA: Insufficient documentation

## 2024-08-17 DIAGNOSIS — Z87891 Personal history of nicotine dependence: Secondary | ICD-10-CM | POA: Insufficient documentation

## 2024-08-17 DIAGNOSIS — Z510 Encounter for antineoplastic radiation therapy: Secondary | ICD-10-CM | POA: Insufficient documentation

## 2024-08-17 NOTE — Progress Notes (Signed)
 Remote Loop Recorder Transmission

## 2024-08-17 NOTE — Telephone Encounter (Signed)
 Scheduled patient for next appointment. Called and spoke with the patients wife, she is aware.

## 2024-08-18 ENCOUNTER — Ambulatory Visit (HOSPITAL_COMMUNITY)
Admission: RE | Admit: 2024-08-18 | Discharge: 2024-08-18 | Disposition: A | Discharge status: Home / Self Care | Source: Ambulatory Visit | Attending: Internal Medicine | Admitting: Internal Medicine

## 2024-08-18 ENCOUNTER — Encounter (HOSPITAL_COMMUNITY): Payer: Self-pay | Admitting: Radiology

## 2024-08-18 DIAGNOSIS — C719 Malignant neoplasm of brain, unspecified: Secondary | ICD-10-CM | POA: Insufficient documentation

## 2024-08-18 MED ORDER — GADOBUTROL 1 MMOL/ML IV SOLN
7.0000 mL | Freq: Once | INTRAVENOUS | Status: AC | PRN
  Administered 2024-08-18: 7 mL via INTRAVENOUS

## 2024-08-21 DIAGNOSIS — Z510 Encounter for antineoplastic radiation therapy: Secondary | ICD-10-CM | POA: Diagnosis not present

## 2024-08-23 ENCOUNTER — Ambulatory Visit
Admission: RE | Admit: 2024-08-23 | Discharge: 2024-08-23 | Disposition: A | Discharge status: Home / Self Care | Source: Ambulatory Visit | Attending: Radiation Oncology | Admitting: Radiation Oncology

## 2024-08-23 ENCOUNTER — Other Ambulatory Visit: Payer: Self-pay

## 2024-08-23 DIAGNOSIS — Z510 Encounter for antineoplastic radiation therapy: Secondary | ICD-10-CM | POA: Diagnosis not present

## 2024-08-23 LAB — RAD ONC ARIA SESSION SUMMARY
Course Elapsed Days: 0
Plan Fractions Treated to Date: 1
Plan Prescribed Dose Per Fraction: 2.67 Gy
Plan Total Fractions Prescribed: 15
Plan Total Prescribed Dose: 40.05 Gy
Reference Point Dosage Given to Date: 2.67 Gy
Reference Point Session Dosage Given: 2.67 Gy
Session Number: 1

## 2024-08-24 ENCOUNTER — Encounter

## 2024-08-24 ENCOUNTER — Ambulatory Visit
Admission: RE | Admit: 2024-08-24 | Discharge: 2024-08-24 | Disposition: A | Discharge status: Home / Self Care | Source: Ambulatory Visit | Attending: Radiation Oncology | Admitting: Radiation Oncology

## 2024-08-24 ENCOUNTER — Other Ambulatory Visit: Payer: Self-pay

## 2024-08-24 ENCOUNTER — Encounter: Payer: Self-pay | Admitting: Internal Medicine

## 2024-08-24 DIAGNOSIS — Z510 Encounter for antineoplastic radiation therapy: Secondary | ICD-10-CM | POA: Diagnosis not present

## 2024-08-24 LAB — RAD ONC ARIA SESSION SUMMARY
Course Elapsed Days: 1
Plan Fractions Treated to Date: 2
Plan Prescribed Dose Per Fraction: 2.67 Gy
Plan Total Fractions Prescribed: 15
Plan Total Prescribed Dose: 40.05 Gy
Reference Point Dosage Given to Date: 5.34 Gy
Reference Point Session Dosage Given: 2.67 Gy
Session Number: 2

## 2024-08-25 ENCOUNTER — Ambulatory Visit: Admitting: Neurosurgery

## 2024-08-25 ENCOUNTER — Encounter (HOSPITAL_COMMUNITY): Payer: Self-pay

## 2024-08-25 ENCOUNTER — Other Ambulatory Visit: Payer: Self-pay

## 2024-08-25 ENCOUNTER — Ambulatory Visit
Admission: RE | Admit: 2024-08-25 | Discharge: 2024-08-25 | Disposition: A | Source: Ambulatory Visit | Attending: Radiation Oncology

## 2024-08-25 ENCOUNTER — Ambulatory Visit (INDEPENDENT_AMBULATORY_CARE_PROVIDER_SITE_OTHER)

## 2024-08-25 ENCOUNTER — Encounter: Payer: Self-pay | Admitting: Neurosurgery

## 2024-08-25 VITALS — BP 102/69 | HR 66 | Temp 97.5°F | Ht 68.0 in | Wt 153.1 lb

## 2024-08-25 DIAGNOSIS — G9389 Other specified disorders of brain: Secondary | ICD-10-CM

## 2024-08-25 DIAGNOSIS — Z510 Encounter for antineoplastic radiation therapy: Secondary | ICD-10-CM | POA: Diagnosis not present

## 2024-08-25 DIAGNOSIS — R55 Syncope and collapse: Secondary | ICD-10-CM | POA: Diagnosis not present

## 2024-08-25 LAB — RAD ONC ARIA SESSION SUMMARY
Course Elapsed Days: 2
Plan Fractions Treated to Date: 3
Plan Prescribed Dose Per Fraction: 2.67 Gy
Plan Total Fractions Prescribed: 15
Plan Total Prescribed Dose: 40.05 Gy
Reference Point Dosage Given to Date: 8.01 Gy
Reference Point Session Dosage Given: 2.67 Gy
Session Number: 3

## 2024-08-25 NOTE — Progress Notes (Unsigned)
 68 year old gentleman with a glioblastoma for which he underwent biopsy.  After rehab, patient started his chemo and radiation which he is currently pursuing.  He is in very good spirits and neurologically unchanged.  I shared with him that I am happy to be on the sidelines as he continues his treatment and if ever there is a surgical indication, he is welcome to come and see me.  He and his family are okay with that plan.

## 2024-08-27 ENCOUNTER — Emergency Department (HOSPITAL_COMMUNITY)
Admission: EM | Admit: 2024-08-27 | Discharge: 2024-08-27 | Disposition: A | Discharge status: Home / Self Care | Attending: Emergency Medicine | Admitting: Emergency Medicine

## 2024-08-27 ENCOUNTER — Emergency Department (HOSPITAL_COMMUNITY)

## 2024-08-27 ENCOUNTER — Encounter (HOSPITAL_COMMUNITY): Payer: Self-pay

## 2024-08-27 DIAGNOSIS — Z7901 Long term (current) use of anticoagulants: Secondary | ICD-10-CM | POA: Insufficient documentation

## 2024-08-27 DIAGNOSIS — K5641 Fecal impaction: Secondary | ICD-10-CM | POA: Insufficient documentation

## 2024-08-27 DIAGNOSIS — K644 Residual hemorrhoidal skin tags: Secondary | ICD-10-CM | POA: Diagnosis not present

## 2024-08-27 DIAGNOSIS — K625 Hemorrhage of anus and rectum: Secondary | ICD-10-CM | POA: Diagnosis present

## 2024-08-27 DIAGNOSIS — I482 Chronic atrial fibrillation, unspecified: Secondary | ICD-10-CM | POA: Insufficient documentation

## 2024-08-27 DIAGNOSIS — K5730 Diverticulosis of large intestine without perforation or abscess without bleeding: Secondary | ICD-10-CM | POA: Insufficient documentation

## 2024-08-27 LAB — CBC WITH DIFFERENTIAL/PLATELET
Abs Immature Granulocytes: 0.23 K/uL — ABNORMAL HIGH (ref 0.00–0.07)
Abs Immature Granulocytes: 0.16 K/uL — ABNORMAL HIGH (ref 0.00–0.07)
Basophils Absolute: 0 K/uL (ref 0.0–0.1)
Basophils Absolute: 0 K/uL (ref 0.0–0.1)
Basophils Relative: 0 %
Basophils Relative: 0 %
Eosinophils Absolute: 0 K/uL (ref 0.0–0.5)
Eosinophils Absolute: 0 K/uL (ref 0.0–0.5)
Eosinophils Relative: 0 %
Eosinophils Relative: 0 %
HCT: 39.6 % (ref 39.0–52.0)
HCT: 38.2 % — ABNORMAL LOW (ref 39.0–52.0)
Hemoglobin: 13.2 g/dL (ref 13.0–17.0)
Hemoglobin: 12.4 g/dL — ABNORMAL LOW (ref 13.0–17.0)
Immature Granulocytes: 2 %
Immature Granulocytes: 1 %
Lymphocytes Relative: 8 %
Lymphocytes Relative: 10 %
Lymphs Abs: 0.9 K/uL (ref 0.7–4.0)
Lymphs Abs: 1.2 K/uL (ref 0.7–4.0)
MCH: 31.7 pg (ref 26.0–34.0)
MCH: 31.2 pg (ref 26.0–34.0)
MCHC: 33.3 g/dL (ref 30.0–36.0)
MCHC: 32.5 g/dL (ref 30.0–36.0)
MCV: 95 fL (ref 80.0–100.0)
MCV: 96 fL (ref 80.0–100.0)
Monocytes Absolute: 0.5 K/uL (ref 0.1–1.0)
Monocytes Absolute: 0.5 K/uL (ref 0.1–1.0)
Monocytes Relative: 4 %
Monocytes Relative: 4 %
Neutro Abs: 10.1 K/uL — ABNORMAL HIGH (ref 1.7–7.7)
Neutro Abs: 9.8 K/uL — ABNORMAL HIGH (ref 1.7–7.7)
Neutrophils Relative %: 86 %
Neutrophils Relative %: 85 %
Platelets: 200 K/uL (ref 150–400)
Platelets: 238 K/uL (ref 150–400)
RBC: 4.17 MIL/uL — ABNORMAL LOW (ref 4.22–5.81)
RBC: 3.98 MIL/uL — ABNORMAL LOW (ref 4.22–5.81)
RDW: 15 % (ref 11.5–15.5)
RDW: 15.1 % (ref 11.5–15.5)
WBC: 11.8 K/uL — ABNORMAL HIGH (ref 4.0–10.5)
WBC: 11.7 K/uL — ABNORMAL HIGH (ref 4.0–10.5)
nRBC: 0 % (ref 0.0–0.2)
nRBC: 0 % (ref 0.0–0.2)

## 2024-08-27 LAB — COMPREHENSIVE METABOLIC PANEL WITH GFR
ALT: 83 U/L — ABNORMAL HIGH (ref 0–44)
AST: 40 U/L (ref 15–41)
Albumin: 3 g/dL — ABNORMAL LOW (ref 3.5–5.0)
Alkaline Phosphatase: 54 U/L (ref 38–126)
Anion gap: 13 (ref 5–15)
BUN: 21 mg/dL (ref 8–23)
CO2: 24 mmol/L (ref 22–32)
Calcium: 8.7 mg/dL — ABNORMAL LOW (ref 8.9–10.3)
Chloride: 96 mmol/L — ABNORMAL LOW (ref 98–111)
Creatinine, Ser: 0.78 mg/dL (ref 0.61–1.24)
GFR, Estimated: >60 mL/min (ref >60)
Glucose, Bld: 136 mg/dL — ABNORMAL HIGH (ref 70–99)
Potassium: 4.4 mmol/L (ref 3.5–5.1)
Sodium: 133 mmol/L — ABNORMAL LOW (ref 135–145)
Total Bilirubin: 1.7 mg/dL — ABNORMAL HIGH (ref 0.0–1.2)
Total Protein: 6.2 g/dL — ABNORMAL LOW (ref 6.5–8.1)

## 2024-08-27 LAB — CUP PACEART REMOTE DEVICE CHECK
Date Time Interrogation Session: 20251016232749
Implantable Pulse Generator Implant Date: 20240111

## 2024-08-27 LAB — POC OCCULT BLOOD, ED: Fecal Occult Bld: POSITIVE — AB

## 2024-08-27 LAB — LIPASE, BLOOD: Lipase: 31 U/L (ref 11–51)

## 2024-08-27 MED ORDER — FLEET ENEMA RE ENEM
1.0000 | ENEMA | Freq: Once | RECTAL | Status: AC
  Administered 2024-08-27: 1 via RECTAL
  Filled 2024-08-27: qty 1

## 2024-08-27 MED ORDER — LIDOCAINE HCL URETHRAL/MUCOSAL 2 % EX GEL
1.0000 | Freq: Once | CUTANEOUS | Status: AC
  Administered 2024-08-27: 1 via TOPICAL
  Filled 2024-08-27: qty 11

## 2024-08-27 MED ORDER — IOHEXOL 350 MG/ML SOLN
75.0000 mL | Freq: Once | INTRAVENOUS | Status: AC | PRN
  Administered 2024-08-27: 75 mL via INTRAVENOUS

## 2024-08-27 NOTE — Discharge Instructions (Signed)
 As we discussed, you have bleeding likely from stool impaction  Please take MiraLAX  twice daily for a week  Please increase your fiber intake  Please stay hydrated  Please use Anusol  cream for your hemorrhoids  You are expected to have some bleeding for several days  Follow-up with your doctor.  Return to ER if you have worse bleeding or severe abdominal pain or vomiting

## 2024-08-27 NOTE — ED Triage Notes (Signed)
 Pt BIB EMS from home for complaints of bright red rectal bleeding. Pt denies abdominal pain and denies N/V/D. Pt is currently on on chemo and radiation. Pt is AOX4.

## 2024-08-27 NOTE — ED Provider Triage Note (Addendum)
 Emergency Medicine Provider Triage Evaluation Note  Wayne Molina , a 68 y.o. male  was evaluated in triage.  Pt complains of bright red blood per rectum. Patient stating that it is his hemorrhoids. Denies abdominal pain, nausea, vomiting, diarrhea, UTI complaints. Family member on their way to provide better history.  Patient later complaining of left flank pain.  Review of Systems  Positive:  Negative:   Physical Exam  There were no vitals taken for this visit. Gen:   Awake, no distress   Resp:  Normal effort  MSK:   Moves extremities without difficulty  Other:    Medical Decision Making  Medically screening exam initiated at 11:19 AM.  Appropriate orders placed.  Wayne Molina was informed that the remainder of the evaluation will be completed by another provider, this initial triage assessment does not replace that evaluation, and the importance of remaining in the ED until their evaluation is complete.    Hoy Nidia FALCON, [place] 08/27/24 1122    Hoy Nidia F, [place] 08/27/24 1335

## 2024-08-27 NOTE — ED Provider Notes (Signed)
 [place] EMERGENCY DEPARTMENT AT [facility] Provider Note   CSN: [id number] Arrival date & time: 08/27/24  1111     Patient presents with: Rectal Bleeding   Wayne Molina is a 68 y.o. male history of A-fib on Eliquis , here presenting with bright red blood per rectum.  Patient states that he has been constipated for several days.  Patient states that he noticed some hard stool and today he tried to have a bowel movement and then noticed some blood around the stool.  Patient states that he has history of hemorrhoids.  He noticed some abdominal cramps as well.   The history is provided by the patient.       Prior to Admission medications   Medication Sig Start Date End Date Taking? Authorizing Provider  acetaminophen  (TYLENOL ) 325 MG tablet Take 1-2 tablets (325-650 mg total) by mouth every 4 (four) hours as needed for mild pain (pain score 1-3). 08/08/24   Angiulli, Toribio PARAS, PA-C  apixaban  (ELIQUIS ) 5 MG TABS tablet Take 1 tablet (5 mg total) by mouth 2 (two) times daily. 08/08/24   Angiulli, Toribio PARAS, PA-C  atorvastatin  (LIPITOR) 40 MG tablet Take 1 tablet (40 mg total) by mouth daily. 08/08/24   Angiulli, Toribio PARAS, PA-C  calcium  carbonate (OSCAL) 1500 (600 Ca) MG TABS tablet Take 1 tablet (1,500 mg total) by mouth 2 (two) times daily with a meal. 08/08/24   Angiulli, Toribio PARAS, PA-C  carvedilol  (COREG ) 12.5 MG tablet Take 1 tablet (12.5 mg total) by mouth 2 (two) times daily. 08/08/24   Angiulli, Daniel J, PA-C  Cyanocobalamin (VITAMIN B-12 PO) Take 1 capsule by mouth daily.    [provider]  dexamethasone  (DECADRON ) 2 MG tablet Take 1 tablet (2 mg total) by mouth every 12 (twelve) hours until 08/23/2024 then 2 mg daily thereafter 08/08/24   Angiulli, Toribio PARAS, PA-C  gabapentin  (NEURONTIN ) 100 MG capsule Take 1 capsule (100 mg total) by mouth 3 (three) times daily. 08/08/24   Angiulli, Toribio PARAS, PA-C  levETIRAcetam  (KEPPRA ) 1000 MG tablet Take 1 tablet (1,000 mg total)  by mouth 2 (two) times daily. 08/08/24   Angiulli, Toribio PARAS, PA-C  LORazepam  (ATIVAN ) 0.5 MG tablet Take 1 tablet 30 minutes prior to radiation procedures as needed to help with anxiety. 08/11/24   Izell Domino, MD  Magnesium Oxide 250 MG TABS Take 1 tablet (250 mg total) by mouth daily in the afternoon. 08/08/24   Angiulli, Toribio PARAS, PA-C  melatonin 3 MG TABS tablet Take 1 tablet (3 mg total) by mouth at bedtime. 08/08/24   Angiulli, Daniel J, PA-C  Multiple Vitamin (MULTIVITAMIN ADULT PO) Take 1 tablet by mouth daily. 02/07/18   [provider]  Nystatin (GERHARDT'S BUTT CREAM) CREA Apply to affected area 08/08/24   Angiulli, Toribio PARAS, PA-C  ondansetron  (ZOFRAN ) 8 MG tablet Take 1 tablet (8 mg total) by mouth every 8 (eight) hours as needed for nausea or vomiting. May take 30-60 minutes prior to Temodar administration if nausea/vomiting occurs as needed. 08/14/24   Vaslow, Zachary K, MD  oxyCODONE  (OXY IR/ROXICODONE ) 5 MG immediate release tablet Take 1 tablet (5 mg total) by mouth every 6 (six) hours as needed for moderate pain (pain score 4-6). 08/08/24   Angiulli, Toribio PARAS, PA-C  pantoprazole  (PROTONIX ) 40 MG tablet Take 1 tablet (40 mg total) by mouth daily. 08/08/24   Angiulli, Toribio PARAS, PA-C  QUEtiapine  (SEROQUEL ) 25 MG tablet Take 1 tablet (25 mg total) by  mouth daily. 08/08/24   Angiulli, Toribio PARAS, PA-C  tamsulosin  (FLOMAX ) 0.4 MG CAPS capsule Take 1 capsule (0.4 mg total) by mouth daily. 08/08/24   Angiulli, Toribio PARAS, PA-C  temozolomide (TEMODAR) 140 MG capsule Take 1 capsule (140 mg total) by mouth daily. May take on an empty stomach to decrease nausea & vomiting. 08/14/24   Vaslow, Zachary K, MD    Allergies: Patient has no known allergies.    Review of Systems  Gastrointestinal:  Positive for blood in stool.  All other systems reviewed and are negative.   Updated Vital Signs BP (!) 105/58   Pulse 76   Temp (!) 97.5 F (36.4 C) (Oral)   Resp 18   SpO2 100%   Physical  Exam Vitals and nursing note reviewed.  Constitutional:      Appearance: Normal appearance.  HENT:     Head: Normocephalic.     Nose: Nose normal.     Mouth/Throat:     Mouth: Mucous membranes are moist.  Eyes:     Extraocular Movements: Extraocular movements intact.     Pupils: Pupils are equal, round, and reactive to light.  Cardiovascular:     Rate and Rhythm: Normal rate and regular rhythm.     Pulses: Normal pulses.     Heart sounds: Normal heart sounds.  Pulmonary:     Effort: Pulmonary effort is normal.     Breath sounds: Normal breath sounds.  Abdominal:     General: Abdomen is flat.     Palpations: Abdomen is soft.  Genitourinary:    Comments: + stool impaction, 2 small external hemorrhoids with no obvious thrombosis Musculoskeletal:     Cervical back: Normal range of motion and neck supple.  Skin:    General: Skin is warm.     Capillary Refill: Capillary refill takes less than 2 seconds.  Neurological:     General: No focal deficit present.     Mental Status: He is alert and oriented to person, place, and time.  Psychiatric:        Mood and Affect: Mood normal.        Behavior: Behavior normal.     (all labs ordered are listed, but only abnormal results are displayed) Labs Reviewed  CBC WITH DIFFERENTIAL/PLATELET - Abnormal; Notable for the following components:      Result Value   WBC 11.8 (*)    RBC 4.17 (*)    Neutro Abs 10.1 (*)    Abs Immature Granulocytes 0.23 (*)    All other components within normal limits  COMPREHENSIVE METABOLIC PANEL WITH GFR - Abnormal; Notable for the following components:   Sodium 133 (*)    Chloride 96 (*)    Glucose, Bld 136 (*)    Calcium  8.7 (*)    Total Protein 6.2 (*)    Albumin 3.0 (*)    ALT 83 (*)    Total Bilirubin 1.7 (*)    All other components within normal limits  LIPASE, BLOOD  URINALYSIS, ROUTINE W REFLEX MICROSCOPIC  CBC WITH DIFFERENTIAL/PLATELET  POC OCCULT BLOOD, ED     EKG: None  Radiology: CT ABDOMEN PELVIS W CONTRAST Result Date: 08/27/2024 CLINICAL DATA:  Abdominal pain. EXAM: CT ABDOMEN AND PELVIS WITH CONTRAST TECHNIQUE: Multidetector CT imaging of the abdomen and pelvis was performed using the standard protocol following bolus administration of intravenous contrast. RADIATION DOSE REDUCTION: This exam was performed according to the departmental dose-optimization program which includes automated exposure control, adjustment of the  mA and/or kV according to patient size and/or use of iterative reconstruction technique. CONTRAST:  75mL OMNIPAQUE  IOHEXOL  350 MG/ML SOLN COMPARISON:  None Available. FINDINGS: Lower chest: Linear atelectasis/scarring at the left lung base. The visualized lung bases are otherwise clear. No intra-abdominal free air or free fluid. Hepatobiliary: There is irregularity of the liver contour consistent with changes of cirrhosis. There is a 2.2 x 1.8 cm hypoenhancing area in the right lobe of the liver posterior to the right portal vein (21/2). This is not characterized but does not demonstrate mass effect and likely represents an area of fatty infiltration. Further evaluation with ultrasound on a nonemergent/outpatient basis recommended. No biliary dilatation. The gallbladder is unremarkable. Pancreas: Unremarkable. No pancreatic ductal dilatation or surrounding inflammatory changes. Spleen: Normal in size without focal abnormality. Adrenals/Urinary Tract: The adrenal glands unremarkable. There is a 2 mm nonobstructing left renal interpolar calculus. No hydronephrosis. There is symmetric enhancement and excretion of contrast by both kidneys. The visualized ureters and urinary bladder appear unremarkable. Stomach/Bowel: There is moderate stool throughout the colon. There is sigmoid diverticulosis. There is no bowel obstruction or active inflammation. The appendix is normal. Vascular/Lymphatic: Moderate aortoiliac atherosclerotic disease. The  IVC is unremarkable. No portal venous gas. There is no adenopathy. Reproductive: The prostate and seminal vesicles are grossly unremarkable Other: None Musculoskeletal: Osteopenia with degenerative changes of spine. Age indeterminate, chronic appearing compression fractures of L2 and L4. No acute osseous pathology. IMPRESSION: 1. No acute intra-abdominal or pelvic pathology. 2. Cirrhosis. 3. Probable focal area of fatty infiltration in the right lobe of the liver. Further evaluation with ultrasound on a nonemergent/outpatient basis recommended. 4. A 2 mm nonobstructing left renal interpolar calculus. No hydronephrosis. 5. Sigmoid diverticulosis. No bowel obstruction. Normal appendix. 6. Age indeterminate, chronic appearing compression fractures of L2 and L4. 7.  Aortic Atherosclerosis (ICD10-I70.0). Electronically Signed   By: Vanetta Chou M.D.   On: 08/27/2024 14:49     Procedures   Medications Ordered in the ED  lidocaine  (XYLOCAINE ) 2 % jelly 1 Application (has no administration in time range)  sodium phosphate  (FLEET) enema 1 enema (has no administration in time range)  iohexol  (OMNIPAQUE ) 350 MG/ML injection 75 mL (75 mLs Intravenous Contrast Given 08/27/24 1432)                                    Medical Decision Making Wayne Molina is a 68 y.o. male here presenting with stool impaction and external hemorrhoids with bleeding.  I think his main issue is stool impaction that is causing hemorrhoidal bleeding.  Also consider diverticulosis as well.  Will try enema to relieve the stool impaction we will check CBC and CT abdomen pelvis  5:04 PM Reviewed patient's labs initial hemoglobin is 13.2.  Repeat hemoglobin is 12.4 which is not significantly changed.  Patient is guaiac positive but patient has stool impaction and also hemorrhoid on exam.  Patient had a large bowel movement after enema.  Patient is feeling much better now.  CT does show diverticulosis but I do not think he has a  diverticular bleed.  He I think he is likely bleeding from his hemorrhoid.  He does have Anusol  at home.  Recommend MiraLAX  twice daily for a week.  Problems Addressed: External hemorrhoid: acute illness or injury Impacted stool in intestine [facility]): acute illness or injury  Amount and/or Complexity of Data Reviewed Labs: ordered. Decision-making details documented in  ED Course.  Risk OTC drugs.     Final diagnoses:  None    ED Discharge Orders     None          Wayne Alm Macho, MD 08/27/24 [phone]

## 2024-08-28 ENCOUNTER — Other Ambulatory Visit: Payer: Self-pay

## 2024-08-28 ENCOUNTER — Ambulatory Visit
Admission: RE | Admit: 2024-08-28 | Discharge: 2024-08-28 | Disposition: A | Source: Ambulatory Visit | Attending: Radiation Oncology

## 2024-08-28 ENCOUNTER — Telehealth: Payer: Self-pay

## 2024-08-28 DIAGNOSIS — Z510 Encounter for antineoplastic radiation therapy: Secondary | ICD-10-CM | POA: Diagnosis not present

## 2024-08-28 LAB — RAD ONC ARIA SESSION SUMMARY
Course Elapsed Days: 5
Plan Fractions Treated to Date: 4
Plan Prescribed Dose Per Fraction: 2.67 Gy
Plan Total Fractions Prescribed: 15
Plan Total Prescribed Dose: 40.05 Gy
Reference Point Dosage Given to Date: 10.68 Gy
Reference Point Session Dosage Given: 2.67 Gy
Session Number: 4

## 2024-08-28 NOTE — Telephone Encounter (Signed)
 Received call from April, RN [facility] regarding concerns with patient's BP.   She received call from wife that BP this AM, 1 hour after taking carvedilol  was 90/59.  Patient is also experiencing leg swelling. Home Health RN advised them to elevate his legs while resting.   Called and spoke with wife. She verified low reading this morning after BP medication. Patient denies lightheadedness or dizziness. Wife also denies chest pain or shortness of breath.   ED visit yesterday with lower BP measurements: 102/84 and 105/58.   Wife reports home readings over the last week of 104-164 systolic and 59-84 diastolic.   Advised that we would want for him to be seen to further evaluate these concerns. We have scheduled him for tomorrow afternoon with PCP.   Dr. Janna- would you like for patient to hold nighttime dose of carvedilol ? Or should wife check BP and hold if below certain parameters?   Please advise.   *Discussed return/ED precautions.

## 2024-08-28 NOTE — Telephone Encounter (Signed)
 Called and spoke with patient's wife. Provided with parameters per Dr. Janna. She reports that patient is at another doctor's appointment and had BP rechecked. BP is now 111/73  Wife elected to cancel appt for tomorrow due to many other appts.   She will continue to keep BP log and call back with any concerns.   Chiquita JAYSON English, RN

## 2024-08-29 ENCOUNTER — Ambulatory Visit: Admitting: Family Medicine

## 2024-08-29 ENCOUNTER — Ambulatory Visit
Admission: RE | Admit: 2024-08-29 | Discharge: 2024-08-29 | Disposition: A | Discharge status: Home / Self Care | Source: Ambulatory Visit | Attending: Radiation Oncology | Admitting: Radiation Oncology

## 2024-08-29 ENCOUNTER — Inpatient Hospital Stay: Admitting: Internal Medicine

## 2024-08-29 ENCOUNTER — Inpatient Hospital Stay (HOSPITAL_BASED_OUTPATIENT_CLINIC_OR_DEPARTMENT_OTHER): Admitting: Nurse Practitioner

## 2024-08-29 ENCOUNTER — Encounter: Payer: Self-pay | Admitting: Nurse Practitioner

## 2024-08-29 ENCOUNTER — Inpatient Hospital Stay

## 2024-08-29 ENCOUNTER — Other Ambulatory Visit: Payer: Self-pay

## 2024-08-29 VITALS — BP 117/62 | HR 62 | Temp 97.8°F | Resp 16 | Wt 159.0 lb

## 2024-08-29 DIAGNOSIS — Z515 Encounter for palliative care: Secondary | ICD-10-CM

## 2024-08-29 DIAGNOSIS — K5901 Slow transit constipation: Secondary | ICD-10-CM | POA: Diagnosis not present

## 2024-08-29 DIAGNOSIS — Z510 Encounter for antineoplastic radiation therapy: Secondary | ICD-10-CM | POA: Diagnosis not present

## 2024-08-29 DIAGNOSIS — Z7189 Other specified counseling: Secondary | ICD-10-CM | POA: Diagnosis not present

## 2024-08-29 DIAGNOSIS — C719 Malignant neoplasm of brain, unspecified: Secondary | ICD-10-CM

## 2024-08-29 DIAGNOSIS — C713 Malignant neoplasm of parietal lobe: Secondary | ICD-10-CM | POA: Diagnosis not present

## 2024-08-29 DIAGNOSIS — R530 Neoplastic (malignant) related fatigue: Secondary | ICD-10-CM | POA: Diagnosis not present

## 2024-08-29 LAB — CBC WITH DIFFERENTIAL (CANCER CENTER ONLY)
Abs Immature Granulocytes: 0.11 K/uL — ABNORMAL HIGH (ref 0.00–0.07)
Basophils Absolute: 0 K/uL (ref 0.0–0.1)
Basophils Relative: 0 %
Eosinophils Absolute: 0 K/uL (ref 0.0–0.5)
Eosinophils Relative: 0 %
HCT: 37 % — ABNORMAL LOW (ref 39.0–52.0)
Hemoglobin: 12.5 g/dL — ABNORMAL LOW (ref 13.0–17.0)
Immature Granulocytes: 1 %
Lymphocytes Relative: 17 %
Lymphs Abs: 1.3 K/uL (ref 0.7–4.0)
MCH: 31.6 pg (ref 26.0–34.0)
MCHC: 33.8 g/dL (ref 30.0–36.0)
MCV: 93.7 fL (ref 80.0–100.0)
Monocytes Absolute: 0.6 K/uL (ref 0.1–1.0)
Monocytes Relative: 7 %
Neutro Abs: 5.8 K/uL (ref 1.7–7.7)
Neutrophils Relative %: 75 %
Platelet Count: 167 K/uL (ref 150–400)
RBC: 3.95 MIL/uL — ABNORMAL LOW (ref 4.22–5.81)
RDW: 15.5 % (ref 11.5–15.5)
WBC Count: 7.9 K/uL (ref 4.0–10.5)
nRBC: 0 % (ref 0.0–0.2)

## 2024-08-29 LAB — CMP (CANCER CENTER ONLY)
ALT: 74 U/L — ABNORMAL HIGH (ref 0–44)
AST: 27 U/L (ref 15–41)
Albumin: 3.4 g/dL — ABNORMAL LOW (ref 3.5–5.0)
Alkaline Phosphatase: 55 U/L (ref 38–126)
Anion gap: 5 (ref 5–15)
BUN: 21 mg/dL (ref 8–23)
CO2: 29 mmol/L (ref 22–32)
Calcium: 9.2 mg/dL (ref 8.9–10.3)
Chloride: 103 mmol/L (ref 98–111)
Creatinine: 0.77 mg/dL (ref 0.61–1.24)
GFR, Estimated: >60 mL/min (ref >60)
Glucose, Bld: 150 mg/dL — ABNORMAL HIGH (ref 70–99)
Potassium: 4 mmol/L (ref 3.5–5.1)
Sodium: 137 mmol/L (ref 135–145)
Total Bilirubin: 1.3 mg/dL — ABNORMAL HIGH (ref 0.0–1.2)
Total Protein: 5.9 g/dL — ABNORMAL LOW (ref 6.5–8.1)

## 2024-08-29 LAB — RAD ONC ARIA SESSION SUMMARY
Course Elapsed Days: 6
Plan Fractions Treated to Date: 5
Plan Prescribed Dose Per Fraction: 2.67 Gy
Plan Total Fractions Prescribed: 15
Plan Total Prescribed Dose: 40.05 Gy
Reference Point Dosage Given to Date: 13.35 Gy
Reference Point Session Dosage Given: 2.67 Gy
Session Number: 5

## 2024-08-29 NOTE — Progress Notes (Signed)
 [facility] [address]  [place], [place] [address] [phone]   Interval Evaluation  Date of Service: 08/29/24 Patient Name: Wayne Molina Patient MRN: [id number] Patient DOB: [birth year] Provider: Arthea MARLA Manns, MD  Identifying Statement:  Wayne Molina is a 68 y.o. male with right parietal glioblastoma   Oncologic History: 07/17/24: Stereotactic biopsy, R parietal with Dr. Rosslyn; path demonstrates glioblastoma IDH-wt 09/02/24: Begins 3 week course of IMRT with concurrent Temodar 75mg /m2 Audry)  Biomarkers:    Interval History: Wayne Molina presents today for follow up, now in midst of 3 weeks course of radiation and Temodar.  He is doing well overall, no new or progressive changes.  He does report swelling in his lower legs in recent days.  Also has been experiencing constipation, though this is improved since starting Miralax .  Continues on Keppra  1000mg  twice per day and decadron  2mg  twice per day.  H+P (08/10/24) Patient presented to neurologic attention on 07/07/24 with new onset seizures.  CNS imaging demonstrated enhancing masses within right hemisphere and callosum c/w primary brain tumor.  He underwent biopsy on 07/17/24 with Dr. Rosslyn; pathology demonstrated glioblastoma, IDH-wt.  Yesterday he was discharged from acute rehab.  At home he is using a wheelchair or walker to ambulate.  His wife is helping with some of his activities of daily living given his weakness and instability.  He is currently dosing decadron  2mg  twice per day, as well as Keppra  1000mg  twice per day.    Medications: Current Outpatient Medications on File Prior to Visit  Medication Sig Dispense Refill   acetaminophen  (TYLENOL ) 325 MG tablet Take 1-2 tablets (325-650 mg total) by mouth every 4 (four) hours as needed for mild pain (pain score 1-3).     apixaban  (ELIQUIS ) 5 MG TABS tablet Take 1 tablet (5 mg total) by mouth 2 (two) times daily. 60 tablet 0    atorvastatin  (LIPITOR) 40 MG tablet Take 1 tablet (40 mg total) by mouth daily. 30 tablet 0   calcium  carbonate (OSCAL) 1500 (600 Ca) MG TABS tablet Take 1 tablet (1,500 mg total) by mouth 2 (two) times daily with a meal. 60 tablet 0   carvedilol  (COREG ) 12.5 MG tablet Take 1 tablet (12.5 mg total) by mouth 2 (two) times daily. 60 tablet 0   Cyanocobalamin (VITAMIN B-12 PO) Take 1 capsule by mouth daily.     dexamethasone  (DECADRON ) 2 MG tablet Take 1 tablet (2 mg total) by mouth every 12 (twelve) hours until 08/23/2024 then 2 mg daily thereafter 60 tablet 0   gabapentin  (NEURONTIN ) 100 MG capsule Take 1 capsule (100 mg total) by mouth 3 (three) times daily. 90 capsule 0   levETIRAcetam  (KEPPRA ) 1000 MG tablet Take 1 tablet (1,000 mg total) by mouth 2 (two) times daily. 60 tablet 0   LORazepam  (ATIVAN ) 0.5 MG tablet Take 1 tablet 30 minutes prior to radiation procedures as needed to help with anxiety. 7 tablet 0   Magnesium Oxide 250 MG TABS Take 1 tablet (250 mg total) by mouth daily in the afternoon. 30 tablet 0   melatonin 3 MG TABS tablet Take 1 tablet (3 mg total) by mouth at bedtime. 30 tablet 0   Multiple Vitamin (MULTIVITAMIN ADULT PO) Take 1 tablet by mouth daily.     Nystatin (GERHARDT'S BUTT CREAM) CREA Apply to affected area 60 each 0   ondansetron  (ZOFRAN ) 8 MG tablet Take 1 tablet (8 mg total) by mouth every  8 (eight) hours as needed for nausea or vomiting. May take 30-60 minutes prior to Temodar administration if nausea/vomiting occurs as needed. 30 tablet 1   oxyCODONE  (OXY IR/ROXICODONE ) 5 MG immediate release tablet Take 1 tablet (5 mg total) by mouth every 6 (six) hours as needed for moderate pain (pain score 4-6). 30 tablet 0   pantoprazole  (PROTONIX ) 40 MG tablet Take 1 tablet (40 mg total) by mouth daily. 30 tablet 0   QUEtiapine  (SEROQUEL ) 25 MG tablet Take 1 tablet (25 mg total) by mouth daily. 30 tablet 0   tamsulosin  (FLOMAX ) 0.4 MG CAPS capsule Take 1 capsule (0.4 mg total)  by mouth daily. 30 capsule 0   temozolomide (TEMODAR) 140 MG capsule Take 1 capsule (140 mg total) by mouth daily. May take on an empty stomach to decrease nausea & vomiting. 21 capsule 0   No current facility-administered medications on file prior to visit.    Allergies: No Known Allergies Past Medical History:  Past Medical History:  Diagnosis Date   Anxiety    Arthritis    fingers   Chronic atrial fibrillation (HCC)    Depression    Family history of breast cancer    Family history of cancer of male genital organ    Family history of gene mutation    BRIP1   Family history of malignant neoplasm of gastrointestinal tract    Hyperlipidemia    Hypertension    Personal history of colonic polyps 11/12/2006   Sleep apnea    Past Surgical History:  Past Surgical History:  Procedure Laterality Date   2-D echocardiogram  07/22/2010   Ejection fraction greater than 55%. Left atrium moderately dilated. Right atrium moderately dilated. 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 Mild to Moderate MR. Mild to moderate TR.   APPLICATION OF CRANIAL NAVIGATION Right 07/17/2024   Procedure: COMPUTER-ASSISTED NAVIGATION, FOR CRANIAL PROCEDURE;  Surgeon: Rosslyn Dino HERO, MD;  Location: [facility] OR;  Service: Neurosurgery;  Laterality: Right;  CRANIAL NAVIGATION   ATRIAL ABLATION SURGERY  11/09/2002   EP IMPLANTABLE DEVICE N/A 02/11/2016   Procedure: Loop Recorder Insertion;  Surgeon: Jerel Balding, MD;  Location: [facility] INVASIVE CV LAB;  Service: Cardiovascular;  Laterality: N/A;   Exercise Myoview stress test  07/08/2000   Nonischemic low-risk.   HIP FRACTURE SURGERY  1970s   IR GENERIC HISTORICAL  01/21/2017   IR RADIOLOGIST EVAL & MGMT 01/21/2017 [facility]-INTERV RAD   STERIOTACTIC STIMULATOR INSERTION Right 07/17/2024   Procedure: OPEN CRANIOTOMY FOR BIOPSY;  Surgeon: Rosslyn Dino HERO, MD;  Location: [facility] OR;  Service: Neurosurgery;  Laterality: Right;  RIGHT STEREOTACTIC BRAIN BIOPSY   Social History:  Social  History   Socioeconomic History   Marital status: Divorced    Spouse name: Not on file   Number of children: 3   Years of education: 12   Highest education level: GED or equivalent  Occupational History   Not on file  Tobacco Use   Smoking status: Former    Current packs/day: 1.00    Types: Cigarettes   Smokeless tobacco: Never   Tobacco comments:    Smoked for approximately 6 months in his life  Vaping Use   Vaping status: Never Used  Substance and Sexual Activity   Alcohol use: No   Drug use: No   Sexual activity: Not Currently  Other Topics Concern   Not on file  Social History Narrative   Patient lives alone in [place] in a one story home. There are no steps.  Patient enjoys going to the gym, watching TV, and going to antique shops.    Patient is divorced with 3 children, two of which have passed; does have a girlfriend.    Patient has his own car and is able to drive.    Social Drivers of Corporate investment banker Strain: Low Risk  (03/22/2024)   Overall Financial Resource Strain (CARDIA)    Difficulty of Paying Living Expenses: Not hard at all  Food Insecurity: No Food Insecurity (08/11/2024)   Hunger Vital Sign    Worried About Running Out of Food in the Last Year: Never true    Ran Out of Food in the Last Year: Never true  Transportation Needs: Unmet Transportation Needs (08/11/2024)   PRAPARE - Administrator, Civil Service (Medical): Yes    Lack of Transportation (Non-Medical): Yes  Physical Activity: Sufficiently Active (03/22/2024)   Exercise Vital Sign    Days of Exercise per Week: 5 days    Minutes of Exercise per Session: 60 min  Stress: No Stress Concern Present (03/22/2024)   [employer] of Occupational Health - Occupational Stress Questionnaire    Feeling of Stress : Only a little  Social Connections: Unknown (03/22/2024)   Social Connection and Isolation Panel    Frequency of Communication with Friends and Family: Not on file     Frequency of Social Gatherings with Friends and Family: Not on file    Attends Religious Services: Not on file    Active Member of Clubs or Organizations: Not on file    Attends Banker Meetings: Not on file    Marital Status: Married  Intimate Partner Violence: Not At Risk (04/16/2023)   Humiliation, Afraid, Rape, and Kick questionnaire    Fear of Current or Ex-Partner: No    Emotionally Abused: No    Physically Abused: No    Sexually Abused: No   Family History:  Family History  Problem Relation Age of Onset   Dementia Mother    Heart disease Father    Diabetes Father    Other Sister        BRIP1 gene mutation   Kidney disease Brother    Colon cancer Maternal Uncle    Lung cancer Paternal Aunt        d. >50   Cancer Paternal Aunt        unknown type, d. >50   Cancer Cousin 17       gynecologic (paternal first cousin)   Colon cancer Nephew 27       arising in colon polyp   Cancer Niece 66       gynecologic; niece in her 72's   Breast cancer Niece 58   Other Niece        BRIP1 gene mutation   Cancer Paternal Great-grandmother        abdominal cancer (PGF's mother) great grandmother    Review of Systems: Constitutional: Doesn't report fevers, chills or abnormal weight loss Eyes: Doesn't report blurriness of vision Ears, nose, mouth, throat, and face: Doesn't report sore throat Respiratory: Doesn't report cough, dyspnea or wheezes Cardiovascular: Doesn't report palpitation, chest discomfort  Gastrointestinal:  Doesn't report nausea, constipation, diarrhea GU: Doesn't report incontinence Skin: Doesn't report skin rashes Neurological: Per HPI Musculoskeletal: Doesn't report joint pain Behavioral/Psych: Doesn't report anxiety  Physical Exam: Vitals:   08/29/24 0951  BP: 117/62  Pulse: 62  Resp: 16  Temp: 97.8 F (36.6 C)  SpO2: 100%  KPS: 70. General: Alert, cooperative, pleasant, in no acute distress Head: Normal EENT: No conjunctival  injection or scleral icterus.  Lungs: Resp effort normal Cardiac: Regular rate Abdomen: Non-distended abdomen Skin: No rashes cyanosis or petechiae. Extremities: pitting edema b/l LE  Neurologic Exam: Mental Status: Awake, alert, attentive to examiner. Oriented to self and environment. Language is fluent with intact comprehension.  Some psychomotor slowing noted. Cranial Nerves: Visual acuity is grossly normal. Visual fields are full. Extra-ocular movements intact. No ptosis. Face is symmetric Motor: Tone and bulk are normal. Power is 4+/5 in left arm and leg. Reflexes are symmetric, no pathologic reflexes present.  Sensory: Intact to light touch Gait: Hemiparetic   Labs: I have reviewed the data as listed    Component Value Date/Time   NA 133 (L) 08/27/2024 1151   NA 139 03/07/2024 1018   K 4.4 08/27/2024 1151   CL 96 (L) 08/27/2024 1151   CO2 24 08/27/2024 1151   GLUCOSE 136 (H) 08/27/2024 1151   BUN 21 08/27/2024 1151   BUN 17 03/07/2024 1018   CREATININE 0.78 08/27/2024 1151   CREATININE 0.97 09/18/2015 0927   CALCIUM  8.7 (L) 08/27/2024 1151   PROT 6.2 (L) 08/27/2024 1151   PROT 7.1 03/07/2024 1018   ALBUMIN 3.0 (L) 08/27/2024 1151   ALBUMIN 4.7 03/07/2024 1018   AST 40 08/27/2024 1151   ALT 83 (H) 08/27/2024 1151   ALKPHOS 54 08/27/2024 1151   BILITOT 1.7 (H) 08/27/2024 1151   BILITOT 1.5 (H) 03/07/2024 1018   GFRNONAA >60 08/27/2024 1151   GFRAA 71 03/29/2020 0814   Lab Results  Component Value Date   WBC 7.9 08/29/2024   NEUTROABS 5.8 08/29/2024   HGB 12.5 (L) 08/29/2024   HCT 37.0 (L) 08/29/2024   MCV 93.7 08/29/2024   PLT 167 08/29/2024    Imaging: CUP PACEART REMOTE DEVICE CHECK Result Date: 08/27/2024 ILR summary report received. Battery status OK. Normal device function. No new symptom, tachy, brady, or pause episodes. No new AF episodes. PVC burden 2.8%. Monthly summary reports and ROV/PRN ML, CVRS  CT ABDOMEN PELVIS W CONTRAST Result Date:  08/27/2024 CLINICAL DATA:  Abdominal pain. EXAM: CT ABDOMEN AND PELVIS WITH CONTRAST TECHNIQUE: Multidetector CT imaging of the abdomen and pelvis was performed using the standard protocol following bolus administration of intravenous contrast. RADIATION DOSE REDUCTION: This exam was performed according to the departmental dose-optimization program which includes automated exposure control, adjustment of the mA and/or kV according to patient size and/or use of iterative reconstruction technique. CONTRAST:  75mL OMNIPAQUE  IOHEXOL  350 MG/ML SOLN COMPARISON:  None Available. FINDINGS: Lower chest: Linear atelectasis/scarring at the left lung base. The visualized lung bases are otherwise clear. No intra-abdominal free air or free fluid. Hepatobiliary: There is irregularity of the liver contour consistent with changes of cirrhosis. There is a 2.2 x 1.8 cm hypoenhancing area in the right lobe of the liver posterior to the right portal vein (21/2). This is not characterized but does not demonstrate mass effect and likely represents an area of fatty infiltration. Further evaluation with ultrasound on a nonemergent/outpatient basis recommended. No biliary dilatation. The gallbladder is unremarkable. Pancreas: Unremarkable. No pancreatic ductal dilatation or surrounding inflammatory changes. Spleen: Normal in size without focal abnormality. Adrenals/Urinary Tract: The adrenal glands unremarkable. There is a 2 mm nonobstructing left renal interpolar calculus. No hydronephrosis. There is symmetric enhancement and excretion of contrast by both kidneys. The visualized ureters and urinary bladder appear unremarkable. Stomach/Bowel: There is moderate stool throughout  the colon. There is sigmoid diverticulosis. There is no bowel obstruction or active inflammation. The appendix is normal. Vascular/Lymphatic: Moderate aortoiliac atherosclerotic disease. The IVC is unremarkable. No portal venous gas. There is no adenopathy.  Reproductive: The prostate and seminal vesicles are grossly unremarkable Other: None Musculoskeletal: Osteopenia with degenerative changes of spine. Age indeterminate, chronic appearing compression fractures of L2 and L4. No acute osseous pathology. IMPRESSION: 1. No acute intra-abdominal or pelvic pathology. 2. Cirrhosis. 3. Probable focal area of fatty infiltration in the right lobe of the liver. Further evaluation with ultrasound on a nonemergent/outpatient basis recommended. 4. A 2 mm nonobstructing left renal interpolar calculus. No hydronephrosis. 5. Sigmoid diverticulosis. No bowel obstruction. Normal appendix. 6. Age indeterminate, chronic appearing compression fractures of L2 and L4. 7.  Aortic Atherosclerosis (ICD10-I70.0). Electronically Signed   By: Vanetta Chou M.D.   On: 08/27/2024 14:49   MR BRAIN W WO CONTRAST Result Date: 08/18/2024 EXAM: MRI BRAIN WITH AND WITHOUT CONTRAST 08/18/2024 03:28:47 PM TECHNIQUE: Multiplanar multisequence MRI of the head/brain was performed with and without the administration of intravenous contrast. COMPARISON: None available. CLINICAL HISTORY: Brain/CNS neoplasm, assess treatment response, Glioblastoma, IDH-wildtype. FINDINGS: BRAIN AND VENTRICLES: Evolving post-biopsy hematoma in the right aspect of the corpus callosum limits assessment. Increased size/bulk of enhancement associated with surrounding glioma in the right parietal lobe and corpus callosum. Increased extension across the corpus callosum to the left. Surrounding T2 FLAIR hyperintensity is improved. No acute infarct.  Small remote infarcts in the cerebellum. No hydrocephalus. Normal flow voids. ORBITS: No acute abnormality. SINUSES: No acute abnormality. BONES AND SOFT TISSUES: Normal bone marrow signal and enhancement. No acute soft tissue abnormality. IMPRESSION: 1. Evolving post-biopsy hematoma in the right aspect of the corpus callosum limits assessment; however, increased size/bulk surrounding  enhancing glioma as well as increased extension across the corpus callosum to the left is suspicious for disease progression. 2. Surrounding T2/FLAIR hyperintensity is improved. Electronically signed by: Gilmore Molt MD 08/18/2024 04:41 PM EDT RP Workstation: [id number]      Assessment/Plan Glioblastoma, IDH-wildtype (HCC)  Terril LITTIE Sharps is clinically stable today, now in week 2/3 of IMRT and concurrent Temodar.  Labs are within normal limits today.  We ultimately recommended continuing with abbreviated 3 weeks course of intensity modulated radiation therapy and concurrent daily Temozolomide.  Radiation will be administered Mon-Fri over 6 weeks, Temodar will be dosed at 75mg /m2 to be given daily over 21 days.  We reviewed side effects of temodar, including fatigue, nausea/vomiting, constipation, and cytopenias.  Informed consent was verbally obtained at bedside to proceed with oral chemotherapy.  Chemotherapy should be held for the following:  ANC less than 1,000  Platelets less than 100,000  LFT or creatinine greater than 2x ULN  If clinical concerns/contraindications develop  Decadron  should be decreased to 2mg  daily if tolerated.    We ask that LONN IM return to clinic in 1 months following post-RT brain MRI, or sooner as needed.  All questions were answered. The patient knows to call the clinic with any problems, questions or concerns. No barriers to learning were detected.  The total time spent in the encounter was 30 minutes and more than 50% was on counseling and review of test results   Arthea MARLA Manns, MD Medical Director of Neuro-Oncology [facility] 08/29/24 10:04 AM

## 2024-08-29 NOTE — Progress Notes (Signed)
 Remote Loop Recorder Transmission

## 2024-08-30 ENCOUNTER — Other Ambulatory Visit: Payer: Self-pay

## 2024-08-30 ENCOUNTER — Ambulatory Visit
Admission: RE | Admit: 2024-08-30 | Discharge: 2024-08-30 | Disposition: A | Discharge status: Home / Self Care | Source: Ambulatory Visit | Attending: Radiation Oncology | Admitting: Radiation Oncology

## 2024-08-30 ENCOUNTER — Ambulatory Visit: Payer: Self-pay | Admitting: Cardiovascular Disease

## 2024-08-30 ENCOUNTER — Encounter: Payer: Self-pay | Admitting: Internal Medicine

## 2024-08-30 DIAGNOSIS — Z510 Encounter for antineoplastic radiation therapy: Secondary | ICD-10-CM | POA: Diagnosis not present

## 2024-08-30 LAB — RAD ONC ARIA SESSION SUMMARY
Course Elapsed Days: 7
Plan Fractions Treated to Date: 6
Plan Prescribed Dose Per Fraction: 2.67 Gy
Plan Total Fractions Prescribed: 15
Plan Total Prescribed Dose: 40.05 Gy
Reference Point Dosage Given to Date: 16.02 Gy
Reference Point Session Dosage Given: 2.67 Gy
Session Number: 6

## 2024-08-30 NOTE — Progress Notes (Signed)
 Palliative Medicine [facility]  [phone]) [phone] [phone]) [phone]   Name: Wayne Molina Date: 08/30/2024 MRN: [id number]  DOB: May 20, 1956  Patient Care Team: Janna Ferrier, DO as PCP - General (Family Medicine) Francyne Headland, MD as PCP - Cardiology (Cardiology) Francyne Headland, MD as Consulting Physician (Cardiology) Tobie Franky SQUIBB, DPM as Consulting Physician (Podiatry)    REASON FOR CONSULTATION: Wayne Molina is a 68 y.o. male with oncologic medical history including right parietal glioblastoma with plans to begin 3 week course of IMRT with concurrent Temodar. Palliative is seeing patient for symptom management and goals of care.    SOCIAL HISTORY:     reports that he has quit smoking. His smoking use included cigarettes. He has never used smokeless tobacco. He reports that he does not drink alcohol and does not use drugs.  ADVANCE DIRECTIVES:  On file. His wife Wayne Molina is documented Clinical research associate.   CODE STATUS: Limited code  PAST MEDICAL HISTORY: Past Medical History:  Diagnosis Date   Anxiety    Arthritis    fingers   Chronic atrial fibrillation (HCC)    Depression    Family history of breast cancer    Family history of cancer of male genital organ    Family history of gene mutation    BRIP1   Family history of malignant neoplasm of gastrointestinal tract    Hyperlipidemia    Hypertension    Personal history of colonic polyps 11/12/2006   Sleep apnea     PAST SURGICAL HISTORY:  Past Surgical History:  Procedure Laterality Date   2-D echocardiogram  07/22/2010   Ejection fraction greater than 55%. Left atrium moderately dilated. Right atrium moderately dilated. 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 Mild to Moderate MR. Mild to moderate TR.   APPLICATION OF CRANIAL NAVIGATION Right 07/17/2024   Procedure: COMPUTER-ASSISTED NAVIGATION, FOR CRANIAL PROCEDURE;  Surgeon: Rosslyn Dino HERO, MD;  Location: [facility] OR;  Service:  Neurosurgery;  Laterality: Right;  CRANIAL NAVIGATION   ATRIAL ABLATION SURGERY  11/09/2002   EP IMPLANTABLE DEVICE N/A 02/11/2016   Procedure: Loop Recorder Insertion;  Surgeon: Headland Francyne, MD;  Location: [facility] INVASIVE CV LAB;  Service: Cardiovascular;  Laterality: N/A;   Exercise Myoview stress test  07/08/2000   Nonischemic low-risk.   HIP FRACTURE SURGERY  1970s   IR GENERIC HISTORICAL  01/21/2017   IR RADIOLOGIST EVAL & MGMT 01/21/2017 [facility]-INTERV RAD   STERIOTACTIC STIMULATOR INSERTION Right 07/17/2024   Procedure: OPEN CRANIOTOMY FOR BIOPSY;  Surgeon: Rosslyn Dino HERO, MD;  Location: [facility] OR;  Service: Neurosurgery;  Laterality: Right;  RIGHT STEREOTACTIC BRAIN BIOPSY    HEMATOLOGY/ONCOLOGY HISTORY:  Oncology History  Glioblastoma, IDH-wildtype (HCC)  07/07/2024 Initial Diagnosis   Glioblastoma, IDH-wildtype (HCC)   08/14/2024 -  Chemotherapy   Patient is on Treatment Plan : BRAIN GLIOBLASTOMA Newly Diagnosed/Treatment Naive Temozolomide with Concurrent Radiation Followed by Sequential Maintenance Temozolomide       ALLERGIES:  has no known allergies.  MEDICATIONS:  Current Outpatient Medications  Medication Sig Dispense Refill   acetaminophen  (TYLENOL ) 325 MG tablet Take 1-2 tablets (325-650 mg total) by mouth every 4 (four) hours as needed for mild pain (pain score 1-3).     apixaban  (ELIQUIS ) 5 MG TABS tablet Take 1 tablet (5 mg total) by mouth 2 (two) times daily. 60 tablet 0   atorvastatin  (LIPITOR) 40 MG tablet Take 1 tablet (40 mg total) by mouth daily. 30 tablet 0   calcium  carbonate (OSCAL)  1500 (600 Ca) MG TABS tablet Take 1 tablet (1,500 mg total) by mouth 2 (two) times daily with a meal. 60 tablet 0   carvedilol  (COREG ) 12.5 MG tablet Take 1 tablet (12.5 mg total) by mouth 2 (two) times daily. 60 tablet 0   Cyanocobalamin (VITAMIN B-12 PO) Take 1 capsule by mouth daily.     dexamethasone  (DECADRON ) 2 MG tablet Take 1 tablet (2 mg total) by mouth every 12 (twelve)  hours until 08/23/2024 then 2 mg daily thereafter 60 tablet 0   gabapentin  (NEURONTIN ) 100 MG capsule Take 1 capsule (100 mg total) by mouth 3 (three) times daily. 90 capsule 0   levETIRAcetam  (KEPPRA ) 1000 MG tablet Take 1 tablet (1,000 mg total) by mouth 2 (two) times daily. 60 tablet 0   LORazepam  (ATIVAN ) 0.5 MG tablet Take 1 tablet 30 minutes prior to radiation procedures as needed to help with anxiety. 7 tablet 0   Magnesium Oxide 250 MG TABS Take 1 tablet (250 mg total) by mouth daily in the afternoon. 30 tablet 0   melatonin 3 MG TABS tablet Take 1 tablet (3 mg total) by mouth at bedtime. 30 tablet 0   Multiple Vitamin (MULTIVITAMIN ADULT PO) Take 1 tablet by mouth daily.     Nystatin (GERHARDT'S BUTT CREAM) CREA Apply to affected area 60 each 0   ondansetron  (ZOFRAN ) 8 MG tablet Take 1 tablet (8 mg total) by mouth every 8 (eight) hours as needed for nausea or vomiting. May take 30-60 minutes prior to Temodar administration if nausea/vomiting occurs as needed. 30 tablet 1   oxyCODONE  (OXY IR/ROXICODONE ) 5 MG immediate release tablet Take 1 tablet (5 mg total) by mouth every 6 (six) hours as needed for moderate pain (pain score 4-6). 30 tablet 0   pantoprazole  (PROTONIX ) 40 MG tablet Take 1 tablet (40 mg total) by mouth daily. 30 tablet 0   polyethylene glycol (MIRALAX  / GLYCOLAX ) 17 g packet Take 17 g by mouth 2 (two) times daily.     QUEtiapine  (SEROQUEL ) 25 MG tablet Take 1 tablet (25 mg total) by mouth daily. 30 tablet 0   tamsulosin  (FLOMAX ) 0.4 MG CAPS capsule Take 1 capsule (0.4 mg total) by mouth daily. 30 capsule 0   temozolomide (TEMODAR) 140 MG capsule Take 1 capsule (140 mg total) by mouth daily. May take on an empty stomach to decrease nausea & vomiting. 21 capsule 0   No current facility-administered medications for this visit.    VITAL SIGNS: There were no vitals taken for this visit. There were no vitals filed for this visit.  Estimated body mass index is 24.18 kg/m as  calculated from the following:   Height as of 08/25/24: 5' 8 (1.727 m).   Weight as of an earlier encounter on 08/29/24: 159 lb (72.1 kg).  LABS: CBC:    Component Value Date/Time   WBC 7.9 08/29/2024 0934   WBC 11.7 (H) 08/27/2024 1630   HGB 12.5 (L) 08/29/2024 0934   HCT 37.0 (L) 08/29/2024 0934   PLT 167 08/29/2024 0934   MCV 93.7 08/29/2024 0934   NEUTROABS 5.8 08/29/2024 0934   LYMPHSABS 1.3 08/29/2024 0934   MONOABS 0.6 08/29/2024 0934   EOSABS 0.0 08/29/2024 0934   BASOSABS 0.0 08/29/2024 0934   Comprehensive Metabolic Panel:    Component Value Date/Time   NA 137 08/29/2024 0934   NA 139 03/07/2024 1018   K 4.0 08/29/2024 0934   CL 103 08/29/2024 0934   CO2 29 08/29/2024 0934  BUN 21 08/29/2024 0934   BUN 17 03/07/2024 1018   CREATININE 0.77 08/29/2024 0934   CREATININE 0.97 09/18/2015 0927   GLUCOSE 150 (H) 08/29/2024 0934   CALCIUM  9.2 08/29/2024 0934   AST 27 08/29/2024 0934   ALT 74 (H) 08/29/2024 0934   ALKPHOS 55 08/29/2024 0934   BILITOT 1.3 (H) 08/29/2024 0934   PROT 5.9 (L) 08/29/2024 0934   PROT 7.1 03/07/2024 1018   ALBUMIN 3.4 (L) 08/29/2024 0934   ALBUMIN 4.7 03/07/2024 1018    RADIOGRAPHIC STUDIES: CUP PACEART REMOTE DEVICE CHECK Result Date: 08/27/2024 ILR summary report received. Battery status OK. Normal device function. No new symptom, tachy, brady, or pause episodes. No new AF episodes. PVC burden 2.8%. Monthly summary reports and ROV/PRN ML, CVRS  CT ABDOMEN PELVIS W CONTRAST Result Date: 08/27/2024 CLINICAL DATA:  Abdominal pain. EXAM: CT ABDOMEN AND PELVIS WITH CONTRAST TECHNIQUE: Multidetector CT imaging of the abdomen and pelvis was performed using the standard protocol following bolus administration of intravenous contrast. RADIATION DOSE REDUCTION: This exam was performed according to the departmental dose-optimization program which includes automated exposure control, adjustment of the mA and/or kV according to patient size and/or  use of iterative reconstruction technique. CONTRAST:  75mL OMNIPAQUE  IOHEXOL  350 MG/ML SOLN COMPARISON:  None Available. FINDINGS: Lower chest: Linear atelectasis/scarring at the left lung base. The visualized lung bases are otherwise clear. No intra-abdominal free air or free fluid. Hepatobiliary: There is irregularity of the liver contour consistent with changes of cirrhosis. There is a 2.2 x 1.8 cm hypoenhancing area in the right lobe of the liver posterior to the right portal vein (21/2). This is not characterized but does not demonstrate mass effect and likely represents an area of fatty infiltration. Further evaluation with ultrasound on a nonemergent/outpatient basis recommended. No biliary dilatation. The gallbladder is unremarkable. Pancreas: Unremarkable. No pancreatic ductal dilatation or surrounding inflammatory changes. Spleen: Normal in size without focal abnormality. Adrenals/Urinary Tract: The adrenal glands unremarkable. There is a 2 mm nonobstructing left renal interpolar calculus. No hydronephrosis. There is symmetric enhancement and excretion of contrast by both kidneys. The visualized ureters and urinary bladder appear unremarkable. Stomach/Bowel: There is moderate stool throughout the colon. There is sigmoid diverticulosis. There is no bowel obstruction or active inflammation. The appendix is normal. Vascular/Lymphatic: Moderate aortoiliac atherosclerotic disease. The IVC is unremarkable. No portal venous gas. There is no adenopathy. Reproductive: The prostate and seminal vesicles are grossly unremarkable Other: None Musculoskeletal: Osteopenia with degenerative changes of spine. Age indeterminate, chronic appearing compression fractures of L2 and L4. No acute osseous pathology. IMPRESSION: 1. No acute intra-abdominal or pelvic pathology. 2. Cirrhosis. 3. Probable focal area of fatty infiltration in the right lobe of the liver. Further evaluation with ultrasound on a nonemergent/outpatient  basis recommended. 4. A 2 mm nonobstructing left renal interpolar calculus. No hydronephrosis. 5. Sigmoid diverticulosis. No bowel obstruction. Normal appendix. 6. Age indeterminate, chronic appearing compression fractures of L2 and L4. 7.  Aortic Atherosclerosis (ICD10-I70.0). Electronically Signed   By: Vanetta Chou M.D.   On: 08/27/2024 14:49   MR BRAIN W WO CONTRAST Result Date: 08/18/2024 EXAM: MRI BRAIN WITH AND WITHOUT CONTRAST 08/18/2024 03:28:47 PM TECHNIQUE: Multiplanar multisequence MRI of the head/brain was performed with and without the administration of intravenous contrast. COMPARISON: None available. CLINICAL HISTORY: Brain/CNS neoplasm, assess treatment response, Glioblastoma, IDH-wildtype. FINDINGS: BRAIN AND VENTRICLES: Evolving post-biopsy hematoma in the right aspect of the corpus callosum limits assessment. Increased size/bulk of enhancement associated with surrounding glioma in  the right parietal lobe and corpus callosum. Increased extension across the corpus callosum to the left. Surrounding T2 FLAIR hyperintensity is improved. No acute infarct.  Small remote infarcts in the cerebellum. No hydrocephalus. Normal flow voids. ORBITS: No acute abnormality. SINUSES: No acute abnormality. BONES AND SOFT TISSUES: Normal bone marrow signal and enhancement. No acute soft tissue abnormality. IMPRESSION: 1. Evolving post-biopsy hematoma in the right aspect of the corpus callosum limits assessment; however, increased size/bulk surrounding enhancing glioma as well as increased extension across the corpus callosum to the left is suspicious for disease progression. 2. Surrounding T2/FLAIR hyperintensity is improved. Electronically signed by: Gilmore Molt MD 08/18/2024 04:41 PM EDT RP Workstation: [id number]   PERFORMANCE STATUS (ECOG) : 1 - Symptomatic but completely ambulatory  Review of Systems  Constitutional:  Positive for activity change and fatigue.  Unless otherwise noted, a complete  review of systems is negative.  Physical Exam General: NAD Cardiovascular: regular rate and rhythm Pulmonary: clear ant fields Abdomen: soft, nontender, + bowel sounds Extremities: no edema, no joint deformities Skin: no rashes Neurological: Alert and oriented x3  Discussed the use of AI scribe software for clinical note transcription with the patient, who gave verbal consent to proceed.  History of Present Illness Wayne Molina is a 68 year old male newly diagnosed parietal glioblastoma who presents for initial outpatient palliative visit. Patient was seen during recent hospitalization by our team. He is accompanied by his wife, Wayne Molina, who is also his medical decision maker.   Wayne Molina is alert and able to engage appropriately in discussions.   I introduced myself, Maygan RN, and Palliative's role in collaboration with the oncology team. Concept of Palliative Care was introduced as specialized medical care for people and their families living with serious illness.  It focuses on providing relief from the symptoms and stress of a serious illness.  The goal is to improve quality of life for both the patient and the family. Values and goals of care important to patient and family were attempted to be elicited.  Patient lives in the home with his new wife. They have known each other for over 10 years however married in April 2025. He has 2 children from previous marriage with 3 grandchildren. 2 stepchildren. He is retired from the [employer]. 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   In the home Wayne Molina is able to perform most ADLs independently with some limitations in fatigue. Gait instability requiring use of walker and gait belt.   His appetite is good, and he denies any nausea or vomiting. He was experiencing constipation but is now taking Miralax  twice a day, which has helped manage this symptom.  His weight was recorded as 159  pounds today, which is a slight increase from 157 pounds yesterday, possibly due to scale variance. Previously, on October 17th, his weight was 153 pounds.  He has been experiencing increased sleepiness since yesterday, despite sleeping well at night. His wife has noted this change and has also been assisting him by propping his legs up with pillows to help with swelling in lower extremities.   He reports pain in his buttocks due to lack of cushioning and discomfort in his legs associated with swelling.   Goals of Care  We discussed Wayne Molina current illness and what it means in the larger context of his on-going co-morbidities. Natural disease trajectory and expectations were discussed.  Patient and his wife are  realistic in their understanding of current diagnosis and treatment plan.  They are clear and expressed wishes to continue to treat the treatable allow him every opportunity to continue to thrive.  His quality of life is most important to him.  I empathetically approach discussions regarding his advance directives, CODE STATUS, and healthcare limitations.  Patient has a documented advanced directive on file which has been reviewed.  No changes at this time.  His wife Wayne Molina is his documented Museum/gallery exhibitions officer.  Patient is clear in his expressed wishes for natural death with no life-sustaining measures in the event of terminal illness and vegetative state. No artificial feedings or tubes.  I discussed the importance of continued conversation with family and their medical providers regarding overall plan of care and treatment options, ensuring decisions are within the context of the patients values and GOCs. Assessment & Plan Established therapeutic relationship. Education provided on palliative's role in collaboration with their Oncology/Radiation team.  Malignant neoplasm undergoing radiation therapy Currently undergoing radiation therapy with the last session scheduled for November  4th. No acute issues related to the therapy. - Schedule follow-up appointment on November 4th at 12:45 PM before radiation session. - Call radiation department to check for earlier availability, ensuring readiness by 11:30 AM if possible. -3 week course of IMRT with concurrent Temodar 75mg    Mobility impairment requiring walker Uses a walker for mobility and can independently move from inside to the car and access the bathroom. No acute changes in mobility status.  Lower extremity edema Reports swelling in the legs. No acute changes. -continue elevating while sitting. Notify medical team if worsens  Chronic constipation managed with Miralax  Previously experienced diarrhea, now managed with Miralax  twice daily. No current issues with constipation or diarrhea.  Fatigue and increased sleepiness Increased sleepiness noted starting yesterday. Sleeps well at night.  Advanced Care Planning Patient has documented advanced directives.  Clear and his expressed wishes to continue to treat the treatable allow him every opportunity to thrive.  His quality of life is most important to him.  He and wife are realistic and understanding of cancer diagnosis and treatment plan. - Wife Wayne Molina is his documented medical decision maker - DNR/DNI - No artificial feedings or life-sustaining measures  Follow-up I will plan to see patient back in 2-3 weeks.  Sooner if needed.  Patient expressed understanding and was in agreement with this plan. He also understands that He can call the clinic at any time with any questions, concerns, or complaints.   Thank you for your referral and allowing Palliative to assist in Mr. Terril CROME Nusbaum's care.   Number and complexity of problems addressed: HIGH - 1 or more chronic illnesses with SEVERE exacerbation, progression, or side effects of treatment - advanced cancer, pain. Any controlled substances utilized were prescribed in the context of palliative care.  Visit  consisted of counseling and education dealing with the complex and emotionally intense issues of symptom management and palliative care in the setting of serious and potentially life-threatening illness.  Signed by: Levon Borer, AGPCNP-BC Palliative Medicine Team/[facility]

## 2024-08-31 ENCOUNTER — Other Ambulatory Visit: Payer: Self-pay | Admitting: Radiation Oncology

## 2024-08-31 ENCOUNTER — Encounter: Payer: Self-pay | Admitting: Pharmacist

## 2024-08-31 ENCOUNTER — Other Ambulatory Visit: Payer: Self-pay

## 2024-08-31 ENCOUNTER — Other Ambulatory Visit: Payer: Self-pay | Admitting: Internal Medicine

## 2024-08-31 ENCOUNTER — Other Ambulatory Visit (HOSPITAL_COMMUNITY): Payer: Self-pay

## 2024-08-31 ENCOUNTER — Ambulatory Visit
Admission: RE | Admit: 2024-08-31 | Discharge: 2024-08-31 | Disposition: A | Source: Ambulatory Visit | Attending: Radiation Oncology

## 2024-08-31 DIAGNOSIS — C713 Malignant neoplasm of parietal lobe: Secondary | ICD-10-CM

## 2024-08-31 DIAGNOSIS — C719 Malignant neoplasm of brain, unspecified: Secondary | ICD-10-CM

## 2024-08-31 DIAGNOSIS — Z510 Encounter for antineoplastic radiation therapy: Secondary | ICD-10-CM | POA: Diagnosis not present

## 2024-08-31 LAB — RAD ONC ARIA SESSION SUMMARY
Course Elapsed Days: 8
Plan Fractions Treated to Date: 7
Plan Prescribed Dose Per Fraction: 2.67 Gy
Plan Total Fractions Prescribed: 15
Plan Total Prescribed Dose: 40.05 Gy
Reference Point Dosage Given to Date: 18.69 Gy
Reference Point Session Dosage Given: 2.67 Gy
Session Number: 7

## 2024-08-31 MED ORDER — LORAZEPAM 0.5 MG PO TABS
ORAL_TABLET | ORAL | 0 refills | Status: DC
  Filled 2024-08-31: qty 18, 10d supply, fill #0
  Filled 2024-09-01: qty 18, 7d supply, fill #0

## 2024-08-31 MED ORDER — TEMOZOLOMIDE 140 MG PO CAPS
140.0000 mg | ORAL_CAPSULE | Freq: Every day | ORAL | 0 refills | Status: DC
  Filled 2024-08-31 – 2024-09-05 (×3): qty 21, 21d supply, fill #0

## 2024-09-01 ENCOUNTER — Other Ambulatory Visit: Payer: Self-pay

## 2024-09-01 ENCOUNTER — Telehealth: Payer: Self-pay

## 2024-09-01 ENCOUNTER — Other Ambulatory Visit (HOSPITAL_COMMUNITY): Payer: Self-pay

## 2024-09-01 ENCOUNTER — Ambulatory Visit
Admission: RE | Admit: 2024-09-01 | Discharge: 2024-09-01 | Disposition: A | Discharge status: Home / Self Care | Source: Ambulatory Visit | Attending: Radiation Oncology | Admitting: Radiation Oncology

## 2024-09-01 DIAGNOSIS — Z510 Encounter for antineoplastic radiation therapy: Secondary | ICD-10-CM | POA: Diagnosis not present

## 2024-09-01 LAB — RAD ONC ARIA SESSION SUMMARY
Course Elapsed Days: 9
Plan Fractions Treated to Date: 8
Plan Prescribed Dose Per Fraction: 2.67 Gy
Plan Total Fractions Prescribed: 15
Plan Total Prescribed Dose: 40.05 Gy
Reference Point Dosage Given to Date: 21.36 Gy
Reference Point Session Dosage Given: 2.67 Gy
Session Number: 8

## 2024-09-01 NOTE — Telephone Encounter (Signed)
 Signe PT with [facility] calls nurse line in regards to home visit.  He reports patient has a resting heart rate of 52. He reports bilateral leg swelling +2.  He reports the patient denies any shortness of breath, chest pains, dizziness or feeling faint.   He reports the patient feels generally well.   Spoke with patient and wife. Advised he would need an evaluation soon. She reports he has a lot of apts with Oncology and the soonest they could schedule would be 10/29. Patient scheduled.   Strict precautions discussed with patient and wife.

## 2024-09-02 ENCOUNTER — Encounter: Payer: Self-pay | Admitting: Internal Medicine

## 2024-09-02 ENCOUNTER — Other Ambulatory Visit (HOSPITAL_COMMUNITY): Payer: Self-pay

## 2024-09-02 MED ORDER — FLUZONE HIGH-DOSE 0.5 ML IM SUSY
0.5000 mL | PREFILLED_SYRINGE | Freq: Once | INTRAMUSCULAR | 0 refills | Status: AC
  Filled 2024-09-02: qty 0.5, 1d supply, fill #0

## 2024-09-04 ENCOUNTER — Other Ambulatory Visit: Payer: Self-pay

## 2024-09-04 ENCOUNTER — Ambulatory Visit
Admission: RE | Admit: 2024-09-04 | Discharge: 2024-09-04 | Disposition: A | Source: Ambulatory Visit | Attending: Radiation Oncology

## 2024-09-04 ENCOUNTER — Inpatient Hospital Stay (HOSPITAL_BASED_OUTPATIENT_CLINIC_OR_DEPARTMENT_OTHER): Admitting: Physician Assistant

## 2024-09-04 ENCOUNTER — Other Ambulatory Visit (HOSPITAL_COMMUNITY): Payer: Self-pay

## 2024-09-04 VITALS — BP 145/83 | HR 59 | Temp 97.7°F | Resp 20 | Wt 169.2 lb

## 2024-09-04 DIAGNOSIS — R600 Localized edema: Secondary | ICD-10-CM

## 2024-09-04 DIAGNOSIS — C719 Malignant neoplasm of brain, unspecified: Secondary | ICD-10-CM

## 2024-09-04 DIAGNOSIS — Z510 Encounter for antineoplastic radiation therapy: Secondary | ICD-10-CM | POA: Diagnosis not present

## 2024-09-04 DIAGNOSIS — C713 Malignant neoplasm of parietal lobe: Secondary | ICD-10-CM | POA: Diagnosis not present

## 2024-09-04 LAB — RAD ONC ARIA SESSION SUMMARY
Course Elapsed Days: 12
Plan Fractions Treated to Date: 9
Plan Prescribed Dose Per Fraction: 2.67 Gy
Plan Total Fractions Prescribed: 15
Plan Total Prescribed Dose: 40.05 Gy
Reference Point Dosage Given to Date: 24.03 Gy
Reference Point Session Dosage Given: 2.67 Gy
Session Number: 9

## 2024-09-04 MED ORDER — FUROSEMIDE 20 MG PO TABS
20.0000 mg | ORAL_TABLET | Freq: Every day | ORAL | 0 refills | Status: DC | PRN
  Filled 2024-09-04 (×2): qty 14, 14d supply, fill #0

## 2024-09-04 NOTE — Progress Notes (Unsigned)
 Symptom Management Consult Note [facility]    Patient Care Team: Janna Ferrier, DO as PCP - General (Family Medicine) Croitoru, Jerel, MD as PCP - Cardiology (Cardiology) Croitoru, Jerel, MD as Consulting Physician (Cardiology) Tobie Franky SQUIBB, DPM as Consulting Physician (Podiatry) Pickenpack-Cousar, Fannie SAILOR, NP as Nurse Practitioner (Hospice and Palliative Medicine) Silvano Valrie SQUIBB, RN as Registered Nurse    Name / MRN / DOB: Wayne Molina  [id number]  17-Feb-1956   Date of visit: 09/04/2024   Chief Complaint/Reason for visit: leg swelling   Current Therapy: Temodar and radiation    ASSESSMENT AND PLAN Patient is a 68 y.o. male with oncologic history of glioblastoma, IDH-wildtype followed by Dr. [name].  I have viewed most recent oncology note and lab work.  #Glioblastoma, IDH-wildtype  - Next appointment with oncologist is 10/09/24. Next palliative care appt   #Bilateral lower extremity edema - Lower extremity edema, likely associated with steroid therapy - Has SOB, baseline per patient. Clear lung exam. No hypoxia.  - Per discussion with Dr. [name] recommendation is to start 20 mg BID lasix . Patient is hesitant for BID dose as he has intermittent episodes of feeling light headed with position changes. He has approx 14 days of decadron  left. Will prescribe lasix  as 20 mg daily PRN x 14 days. Patient and spouse aware and provided with written instructions that he can also take it 20 mg BID. Discussed to stop lasix  if symptomatic, hypotensive, or when edema resolves. - Patient has PCP appointment in x 2 days and will ask for legs to be rechecked then.  - Rechecked patient after wrapping feet in heated blanket and tactile temperature is equal throughout bilateral lower extremities. - Chart review today shows history of normal renal function.  Strict ED precautions discussed should symptoms worsen.   HEME/ONC HISTORY Oncology History  Glioblastoma,  IDH-wildtype (HCC)  07/07/2024 Initial Diagnosis   Glioblastoma, IDH-wildtype (HCC)   08/14/2024 -  Chemotherapy   Patient is on Treatment Plan : BRAIN GLIOBLASTOMA Newly Diagnosed/Treatment Naive Temozolomide with Concurrent Radiation Followed by Sequential Maintenance Temozolomide         INTERVAL HISTORY  Discussed the use of AI scribe software for clinical note transcription with the patient, who gave verbal consent to proceed.    Wayne Molina is a 68 y.o. male with oncologic history as above presenting to [facility] today with chief complaint of leg swelling. Accompanied to clinic today by spouse who provides additional history.  He experiences persistent coldness in his feet, which has recently worsened. There is no associated pain, but he describes a sensation of tightness. His hands also get cold. He has not been previously evaluated for circulation issues and has no diagnosis of Raynaud's or similar conditions. This has been going on for many years per patient and is unchanged today.  Her has has swelling in his legs for at least one month now. Does not typically improve with elevation. Does not eat a high salt diet. He has not used diuretics like Lasix  before. The swelling is similar to the last several weeks.. He has a history of a blood clot in his right leg, diagnosed before his discharge from the hospital in late September. He is compliant with his Eliquis . He is currently on Decadron , reduced from 2 mg twice daily to 2 mg daily on October 15.  He experiences shortness of breath, which he considers normal, and occasional lightheadedness, particularly when standing, which he attributes to low blood  pressure. His home blood pressure readings have been around 120/63, with some variation.     ROS  All other systems are reviewed and are negative for acute change except as noted in the HPI.    No Known Allergies   Past Medical History:  Diagnosis Date   Anxiety    Arthritis     fingers   Chronic atrial fibrillation (HCC)    Depression    Family history of breast cancer    Family history of cancer of male genital organ    Family history of gene mutation    BRIP1   Family history of malignant neoplasm of gastrointestinal tract    Hyperlipidemia    Hypertension    Personal history of colonic polyps 11/12/2006   Sleep apnea      Past Surgical History:  Procedure Laterality Date   2-D echocardiogram  07/22/2010   Ejection fraction greater than 55%. Left atrium moderately dilated. Right atrium moderately dilated. 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 Mild to Moderate MR. Mild to moderate TR.   APPLICATION OF CRANIAL NAVIGATION Right 07/17/2024   Procedure: COMPUTER-ASSISTED NAVIGATION, FOR CRANIAL PROCEDURE;  Surgeon: Rosslyn Dino HERO, MD;  Location: [facility] OR;  Service: Neurosurgery;  Laterality: Right;  CRANIAL NAVIGATION   ATRIAL ABLATION SURGERY  11/09/2002   EP IMPLANTABLE DEVICE N/A 02/11/2016   Procedure: Loop Recorder Insertion;  Surgeon: Jerel Balding, MD;  Location: [facility] INVASIVE CV LAB;  Service: Cardiovascular;  Laterality: N/A;   Exercise Myoview stress test  07/08/2000   Nonischemic low-risk.   HIP FRACTURE SURGERY  1970s   IR GENERIC HISTORICAL  01/21/2017   IR RADIOLOGIST EVAL & MGMT 01/21/2017 [facility]-INTERV RAD   STERIOTACTIC STIMULATOR INSERTION Right 07/17/2024   Procedure: OPEN CRANIOTOMY FOR BIOPSY;  Surgeon: Rosslyn Dino HERO, MD;  Location: [facility] OR;  Service: Neurosurgery;  Laterality: Right;  RIGHT STEREOTACTIC BRAIN BIOPSY    Social History   Socioeconomic History   Marital status: Divorced    Spouse name: Not on file   Number of children: 3   Years of education: 12   Highest education level: GED or equivalent  Occupational History   Not on file  Tobacco Use   Smoking status: Former    Current packs/day: 1.00    Types: Cigarettes   Smokeless tobacco: Never   Tobacco comments:    Smoked for approximately 6 months in his life  Vaping Use    Vaping status: Never Used  Substance and Sexual Activity   Alcohol use: No   Drug use: No   Sexual activity: Not Currently  Other Topics Concern   Not on file  Social History Narrative   Patient lives alone in [place] in a one story home. There are no steps.    Patient enjoys going to the gym, watching TV, and going to antique shops.    Patient is divorced with 3 children, two of which have passed; does have a girlfriend.    Patient has his own car and is able to drive.    Social Drivers of Corporate Investment Banker Strain: Low Risk  (03/22/2024)   Overall Financial Resource Strain (CARDIA)    Difficulty of Paying Living Expenses: Not hard at all  Food Insecurity: No Food Insecurity (08/11/2024)   Hunger Vital Sign    Worried About Running Out of Food in the Last Year: Never true    Ran Out of Food in the Last Year: Never true  Transportation Needs: Unmet Transportation Needs (08/11/2024)  PRAPARE - Administrator, Civil Service (Medical): Yes    Lack of Transportation (Non-Medical): Yes  Physical Activity: Sufficiently Active (03/22/2024)   Exercise Vital Sign    Days of Exercise per Week: 5 days    Minutes of Exercise per Session: 60 min  Stress: No Stress Concern Present (03/22/2024)   [employer] of Occupational Health - Occupational Stress Questionnaire    Feeling of Stress : Only a little  Social Connections: Unknown (03/22/2024)   Social Connection and Isolation Panel    Frequency of Communication with Friends and Family: Not on file    Frequency of Social Gatherings with Friends and Family: Not on file    Attends Religious Services: Not on file    Active Member of Clubs or Organizations: Not on file    Attends Banker Meetings: Not on file    Marital Status: Married  Intimate Partner Violence: Not At Risk (04/16/2023)   Humiliation, Afraid, Rape, and Kick questionnaire    Fear of Current or Ex-Partner: No    Emotionally Abused: No     Physically Abused: No    Sexually Abused: No    Family History  Problem Relation Age of Onset   Dementia Mother    Heart disease Father    Diabetes Father    Other Sister        BRIP1 gene mutation   Kidney disease Brother    Colon cancer Maternal Uncle    Lung cancer Paternal Aunt        d. >50   Cancer Paternal Aunt        unknown type, d. >50   Cancer Cousin 17       gynecologic (paternal first cousin)   Colon cancer Nephew 27       arising in colon polyp   Cancer Niece 14       gynecologic; niece in her 76's   Breast cancer Niece 44   Other Niece        BRIP1 gene mutation   Cancer Paternal Great-grandmother        abdominal cancer (PGF's mother) great grandmother     Current Outpatient Medications:    furosemide  (LASIX ) 20 MG tablet, Take 1 tablet (20 mg total) by mouth daily as needed for up to 14 days. Stop taking if swelling resolves, Disp: 14 tablet, Rfl: 0   acetaminophen  (TYLENOL ) 325 MG tablet, Take 1-2 tablets (325-650 mg total) by mouth every 4 (four) hours as needed for mild pain (pain score 1-3)., Disp: , Rfl:    apixaban  (ELIQUIS ) 5 MG TABS tablet, Take 1 tablet (5 mg total) by mouth 2 (two) times daily., Disp: 60 tablet, Rfl: 0   atorvastatin  (LIPITOR) 40 MG tablet, Take 1 tablet (40 mg total) by mouth daily., Disp: 30 tablet, Rfl: 0   calcium  carbonate (OSCAL) 1500 (600 Ca) MG TABS tablet, Take 1 tablet (1,500 mg total) by mouth 2 (two) times daily with a meal., Disp: 60 tablet, Rfl: 0   carvedilol  (COREG ) 12.5 MG tablet, Take 1 tablet (12.5 mg total) by mouth 2 (two) times daily., Disp: 60 tablet, Rfl: 0   Cyanocobalamin (VITAMIN B-12 PO), Take 1 capsule by mouth daily., Disp: , Rfl:    dexamethasone  (DECADRON ) 2 MG tablet, Take 1 tablet (2 mg total) by mouth every 12 (twelve) hours until 08/23/2024 then 2 mg daily thereafter, Disp: 60 tablet, Rfl: 0   gabapentin  (NEURONTIN ) 100 MG capsule, Take 1 capsule (100  mg total) by mouth 3 (three) times daily.,  Disp: 90 capsule, Rfl: 0   levETIRAcetam  (KEPPRA ) 1000 MG tablet, Take 1 tablet (1,000 mg total) by mouth 2 (two) times daily., Disp: 60 tablet, Rfl: 0   LORazepam  (ATIVAN ) 0.5 MG tablet, Take 1 tablet 30 minutes prior to radiation procedures or MRIs as needed to help with anxiety. (OK to take 2 tablets before MRIs)., Disp: 18 tablet, Rfl: 0   Magnesium Oxide 250 MG TABS, Take 1 tablet (250 mg total) by mouth daily in the afternoon., Disp: 30 tablet, Rfl: 0   melatonin 3 MG TABS tablet, Take 1 tablet (3 mg total) by mouth at bedtime., Disp: 30 tablet, Rfl: 0   Multiple Vitamin (MULTIVITAMIN ADULT PO), Take 1 tablet by mouth daily., Disp: , Rfl:    Nystatin (GERHARDT'S BUTT CREAM) CREA, Apply to affected area, Disp: 60 each, Rfl: 0   ondansetron  (ZOFRAN ) 8 MG tablet, Take 1 tablet (8 mg total) by mouth every 8 (eight) hours as needed for nausea or vomiting. May take 30-60 minutes prior to Temodar administration if nausea/vomiting occurs as needed., Disp: 30 tablet, Rfl: 1   oxyCODONE  (OXY IR/ROXICODONE ) 5 MG immediate release tablet, Take 1 tablet (5 mg total) by mouth every 6 (six) hours as needed for moderate pain (pain score 4-6)., Disp: 30 tablet, Rfl: 0   pantoprazole  (PROTONIX ) 40 MG tablet, Take 1 tablet (40 mg total) by mouth daily., Disp: 30 tablet, Rfl: 0   polyethylene glycol (MIRALAX  / GLYCOLAX ) 17 g packet, Take 17 g by mouth 2 (two) times daily., Disp: , Rfl:    QUEtiapine  (SEROQUEL ) 25 MG tablet, Take 1 tablet (25 mg total) by mouth daily., Disp: 30 tablet, Rfl: 0   tamsulosin  (FLOMAX ) 0.4 MG CAPS capsule, Take 1 capsule (0.4 mg total) by mouth daily., Disp: 30 capsule, Rfl: 0   temozolomide (TEMODAR) 140 MG capsule, Take 1 capsule (140 mg total) by mouth daily. May take on an empty stomach to decrease nausea & vomiting., Disp: 21 capsule, Rfl: 0  PHYSICAL EXAM ECOG FS:1 - Symptomatic but completely ambulatory    Vitals:   09/04/24 1522  BP: (!) 145/83  Pulse: (!) 59  Resp: 20   Temp: 97.7 F (36.5 C)  SpO2: 100%  Weight: 169 lb 3.2 oz (76.7 kg)   Physical Exam Vitals and nursing note reviewed.  Constitutional:      Appearance: He is not ill-appearing or toxic-appearing.  HENT:     Head: Normocephalic.  Eyes:     Conjunctiva/sclera: Conjunctivae normal.  Cardiovascular:     Rate and Rhythm: Normal rate and regular rhythm.     Pulses:          Dorsalis pedis pulses are 2+ on the right side and 2+ on the left side.     Heart sounds: Normal heart sounds.  Pulmonary:     Effort: Pulmonary effort is normal.     Breath sounds: Normal breath sounds.  Abdominal:     General: There is no distension.  Musculoskeletal:     Cervical back: Normal range of motion.     Right lower leg: 2+ Edema present.     Left lower leg: 2+ Edema present.  Feet:     Right foot:     Skin integrity: Dry skin present.     Toenail Condition: Right toenails are abnormally thick.     Left foot:     Skin integrity: Dry skin present.     Toenail  Condition: Left toenails are abnormally thick.  Skin:    General: Skin is warm and dry.     Comments: Bilateral feet are cooler to the touch compared to upper legs. Skin blanches on bilateral feet.  Neurological:     Mental Status: He is alert.        LABORATORY DATA I have reviewed the data as listed    Latest Ref Rng & Units 08/29/2024    9:34 AM 08/27/2024    4:30 PM 08/27/2024   11:51 AM  CBC  WBC 4.0 - 10.5 K/uL 7.9  11.7  11.8   Hemoglobin 13.0 - 17.0 g/dL 87.4  87.5  86.7   Hematocrit 39.0 - 52.0 % 37.0  38.2  39.6   Platelets 150 - 400 K/uL 167  238  200         Latest Ref Rng & Units 08/29/2024    9:34 AM 08/27/2024   11:51 AM 08/08/2024    9:15 AM  CMP  Glucose 70 - 99 mg/dL 849  863    BUN 8 - 23 mg/dL 21  21    Creatinine 9.38 - 1.24 mg/dL 9.22  9.21    Sodium 864 - 145 mmol/L 137  133    Potassium 3.5 - 5.1 mmol/L 4.0  4.4  4.2   Chloride 98 - 111 mmol/L 103  96    CO2 22 - 32 mmol/L 29  24    Calcium   8.9 - 10.3 mg/dL 9.2  8.7    Total Protein 6.5 - 8.1 g/dL 5.9  6.2    Total Bilirubin 0.0 - 1.2 mg/dL 1.3  1.7    Alkaline Phos 38 - 126 U/L 55  54    AST 15 - 41 U/L 27  40    ALT 0 - 44 U/L 74  83         RADIOGRAPHIC STUDIES (from last 24 hours if applicable) I have personally reviewed the radiological images as listed and agreed with the findings in the report. No results found.      Visit Diagnosis: 1. Glioblastoma, IDH-wildtype (HCC)   2. Bilateral lower extremity edema      No orders of the defined types were placed in this encounter.   All questions were answered. The patient knows to call the clinic with any problems, questions or concerns. No barriers to learning was detected.  A total of more than 30 minutes were spent on this encounter with face-to-face time and non-face-to-face time, including preparing to see the patient, ordering tests and/or medications, counseling the patient and coordination of care as outlined above.    Thank you for allowing me to participate in the care of this patient.    Jenevieve Kirschbaum E  Walisiewicz, PA-C Department of Hematology/Oncology [facility] Phone: [phone]  [phone]    09/05/2024 9:49 AM

## 2024-09-04 NOTE — Patient Instructions (Signed)
 Lasix  can be taken twice daily if you tolerate it or once daily.    When the swelling resolves stop taking lasix .   If your blood pressure is low or you feel dizzy skip the lasix  dose.

## 2024-09-05 ENCOUNTER — Encounter: Payer: Self-pay | Admitting: Internal Medicine

## 2024-09-05 ENCOUNTER — Other Ambulatory Visit: Payer: Self-pay

## 2024-09-05 ENCOUNTER — Ambulatory Visit
Admission: RE | Admit: 2024-09-05 | Discharge: 2024-09-05 | Disposition: A | Discharge status: Home / Self Care | Source: Ambulatory Visit | Attending: Radiation Oncology | Admitting: Radiation Oncology

## 2024-09-05 ENCOUNTER — Telehealth: Payer: Self-pay

## 2024-09-05 ENCOUNTER — Other Ambulatory Visit (HOSPITAL_COMMUNITY): Payer: Self-pay

## 2024-09-05 DIAGNOSIS — Z510 Encounter for antineoplastic radiation therapy: Secondary | ICD-10-CM | POA: Diagnosis not present

## 2024-09-05 LAB — RAD ONC ARIA SESSION SUMMARY
Course Elapsed Days: 13
Plan Fractions Treated to Date: 10
Plan Prescribed Dose Per Fraction: 2.67 Gy
Plan Total Fractions Prescribed: 15
Plan Total Prescribed Dose: 40.05 Gy
Reference Point Dosage Given to Date: 26.7 Gy
Reference Point Session Dosage Given: 2.67 Gy
Session Number: 10

## 2024-09-05 NOTE — Telephone Encounter (Signed)
 Received call from April, home health RN with [facility] regarding patient.  She reports that during visit today, she noticed that he was having drainage from between toes on both feet. States that drainage is clear and does not have odor.   Patient was started on Lasix  yesterday for BLE edema. She reports that the swelling has slightly improved, however, is asking if patient should also be wearing compression socks and at what level of compression.   Patient is not having SHOB or chest pain.   Patient is scheduled with Dr. Manon tomorrow for follow up.   Chiquita JAYSON English, RN

## 2024-09-06 ENCOUNTER — Other Ambulatory Visit: Payer: Self-pay

## 2024-09-06 ENCOUNTER — Ambulatory Visit (INDEPENDENT_AMBULATORY_CARE_PROVIDER_SITE_OTHER)

## 2024-09-06 ENCOUNTER — Ambulatory Visit
Admission: RE | Admit: 2024-09-06 | Discharge: 2024-09-06 | Disposition: A | Source: Ambulatory Visit | Attending: Radiation Oncology

## 2024-09-06 ENCOUNTER — Other Ambulatory Visit (HOSPITAL_COMMUNITY): Payer: Self-pay

## 2024-09-06 VITALS — BP 104/60 | HR 71 | Ht 68.0 in | Wt 170.6 lb

## 2024-09-06 DIAGNOSIS — Z5181 Encounter for therapeutic drug level monitoring: Secondary | ICD-10-CM | POA: Diagnosis not present

## 2024-09-06 DIAGNOSIS — R400 Somnolence: Secondary | ICD-10-CM

## 2024-09-06 DIAGNOSIS — Z510 Encounter for antineoplastic radiation therapy: Secondary | ICD-10-CM | POA: Diagnosis not present

## 2024-09-06 DIAGNOSIS — R600 Localized edema: Secondary | ICD-10-CM | POA: Diagnosis not present

## 2024-09-06 LAB — RAD ONC ARIA SESSION SUMMARY
Course Elapsed Days: 14
Plan Fractions Treated to Date: 11
Plan Prescribed Dose Per Fraction: 2.67 Gy
Plan Total Fractions Prescribed: 15
Plan Total Prescribed Dose: 40.05 Gy
Reference Point Dosage Given to Date: 29.37 Gy
Reference Point Session Dosage Given: 2.67 Gy
Session Number: 11

## 2024-09-06 MED ORDER — PANTOPRAZOLE SODIUM 40 MG PO TBEC
40.0000 mg | DELAYED_RELEASE_TABLET | Freq: Every day | ORAL | 0 refills | Status: DC
  Filled 2024-09-06: qty 30, 30d supply, fill #0

## 2024-09-06 MED ORDER — TAMSULOSIN HCL 0.4 MG PO CAPS
0.4000 mg | ORAL_CAPSULE | Freq: Every day | ORAL | 0 refills | Status: DC
  Filled 2024-09-06: qty 30, 30d supply, fill #0

## 2024-09-06 MED ORDER — QUETIAPINE FUMARATE 25 MG PO TABS
25.0000 mg | ORAL_TABLET | Freq: Every day | ORAL | 0 refills | Status: DC
  Filled 2024-09-06: qty 30, 30d supply, fill #0

## 2024-09-06 NOTE — Patient Instructions (Signed)
 It was great to see you today!  Today we talked about your swelling in your legs and associated skin breakdown.  The swelling in your legs is likely due to the chronic steroid medicines that you are taking.  While you are on these medicines you should continue to elevate your legs whenever possible, and use compression stockings to limit the accumulation of fluid in your feet when you need to be up and about. If you notice any areas of skin breakdown or pressure spots, please put a small dressing to prevent further breakdown, as his skin is more susceptible while swollen. Today I did not appreciate any areas of concern on his feet.   We'll do one lab test in the office today to check his kidney function while he's on the lasix , which he can continue to take with the existing blood pressure instructions.   Please start taking the Quetiapine  medication at night time. It is primarily to help with agitation, not for seizures, and taking it at night may help him be less sleepy during the day.   Thank you for choosing [facility].   Please call [phone] with any questions about today's appointment.  Wayne Scriver, DO Family Medicine

## 2024-09-06 NOTE — Progress Notes (Signed)
    SUBJECTIVE:   CHIEF COMPLAINT / HPI:   Patient presents with 2-3 weeks of LE edema, and 1 week of clear odorless drainage between his toes b/l. He also has one red spot on the back of his L calf. He has been given Lasix  to take PRN by his oncologist to help with the swelling, which they state was likely due to the chronic steroids he's taking at the moment as part of his chemo regimen. 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 They are elevating his legs whenever possible. They are curious how to manage the swelling and whether any of the spots on his skin or drainage is concerning.   He is doing remarkably well overall other than some daytime sleepiness. Rarely needs his narcotic pain medication.   PERTINENT  PMH / PSH: GBM, Afib, HTN, VTE, sleep apnea  OBJECTIVE:   BP 104/60   Pulse 71   Ht 5' 8 (1.727 m)   Wt 170 lb 9.6 oz (77.4 kg)   SpO2 98%   BMI 25.94 kg/m   General: Awake, alert, NAD.  Cardio: irregular rhythm, No murmur, rub, gallop appreciated. 2+ radial and dorsalis pedis pulses b/l w/ good capillary refill. Moderate b/l 3+ pitting LE edema.  Resp: CTA bilaterally. No wheezes, rales, or rhonchi. Normal work of breathing on room air.  Skin: No rash appreciated. No abnormal nevi. L posterior calf with one 1x1cm red erosion w/o drainage or surrounding erythema. L little toe w/ mild erythema over the medial aspect representing a possible pressure spot.   ASSESSMENT/PLAN:   Assessment & Plan Bilateral leg edema 2 to 3 weeks of bilateral lower extremity swelling to the midshin.  Likely secondary to chronic oral steroid use part of his cancer treatment regimen with possible contribution from abnormal echo with normal left ventricular function.  Patient denies shortness of breath today. Commended consistent elevation and use of compression stockings.  Use Lasix  once daily as needed for swelling when blood pressure greater than  110. No evidence of foot ulcers or wounds today, one erosion on posterior L calf to be bandaged. Continue to place pressure bandages on hotspots and areas of skin breakdown.  Medication monitoring encounter BMP today for monitoring while on Lasix .  Patient instructed by oncologist to take the medication as needed for swelling when blood pressure is greater than 110. Daytime sleepiness Patient has yet to be contacted for his sleep study.  Will reorder. Switching Seroquel  to nightly to avoid daytime sedation.     Leafy Scriver, DO [facility]

## 2024-09-07 ENCOUNTER — Ambulatory Visit: Payer: Self-pay

## 2024-09-07 ENCOUNTER — Ambulatory Visit
Admission: RE | Admit: 2024-09-07 | Discharge: 2024-09-07 | Disposition: A | Source: Ambulatory Visit | Attending: Radiation Oncology

## 2024-09-07 ENCOUNTER — Encounter: Payer: Self-pay | Admitting: Internal Medicine

## 2024-09-07 ENCOUNTER — Other Ambulatory Visit: Payer: Self-pay

## 2024-09-07 ENCOUNTER — Other Ambulatory Visit: Payer: Self-pay | Admitting: Pharmacy Technician

## 2024-09-07 DIAGNOSIS — Z510 Encounter for antineoplastic radiation therapy: Secondary | ICD-10-CM | POA: Diagnosis not present

## 2024-09-07 LAB — BASIC METABOLIC PANEL WITH GFR
BUN/Creatinine Ratio: 24 (ref 10–24)
BUN: 21 mg/dL (ref 8–27)
CO2: 24 mmol/L (ref 20–29)
Calcium: 9.2 mg/dL (ref 8.6–10.2)
Chloride: 102 mmol/L (ref 96–106)
Creatinine, Ser: 0.87 mg/dL (ref 0.76–1.27)
Glucose: 106 mg/dL — ABNORMAL HIGH (ref 70–99)
Potassium: 4.4 mmol/L (ref 3.5–5.2)
Sodium: 141 mmol/L (ref 134–144)
eGFR: 93 mL/min/1.73 (ref >59)

## 2024-09-07 LAB — RAD ONC ARIA SESSION SUMMARY
Course Elapsed Days: 15
Plan Fractions Treated to Date: 12
Plan Prescribed Dose Per Fraction: 2.67 Gy
Plan Total Fractions Prescribed: 15
Plan Total Prescribed Dose: 40.05 Gy
Reference Point Dosage Given to Date: 32.04 Gy
Reference Point Session Dosage Given: 2.67 Gy
Session Number: 12

## 2024-09-07 NOTE — Progress Notes (Signed)
 Specialty Pharmacy Refill Coordination Note  Wayne Molina is a 68 y.o. male contacted today regarding refills of specialty medication(s) Temozolomide (TEMODAR)  Spoke with Wife  Patient requested Delivery   Delivery date: 09/08/24   Verified address: [address]  [place]   Medication will be filled on: 09/07/24

## 2024-09-08 ENCOUNTER — Other Ambulatory Visit: Payer: Self-pay

## 2024-09-08 ENCOUNTER — Encounter: Payer: Self-pay | Admitting: Internal Medicine

## 2024-09-08 ENCOUNTER — Ambulatory Visit
Admission: RE | Admit: 2024-09-08 | Discharge: 2024-09-08 | Disposition: A | Discharge status: Home / Self Care | Source: Ambulatory Visit | Attending: Radiation Oncology | Admitting: Radiation Oncology

## 2024-09-08 ENCOUNTER — Other Ambulatory Visit (HOSPITAL_COMMUNITY): Payer: Self-pay

## 2024-09-08 ENCOUNTER — Other Ambulatory Visit: Payer: Self-pay | Admitting: Family Medicine

## 2024-09-08 DIAGNOSIS — Z510 Encounter for antineoplastic radiation therapy: Secondary | ICD-10-CM | POA: Diagnosis not present

## 2024-09-08 LAB — RAD ONC ARIA SESSION SUMMARY
Course Elapsed Days: 16
Plan Fractions Treated to Date: 13
Plan Prescribed Dose Per Fraction: 2.67 Gy
Plan Total Fractions Prescribed: 15
Plan Total Prescribed Dose: 40.05 Gy
Reference Point Dosage Given to Date: 34.71 Gy
Reference Point Session Dosage Given: 2.67 Gy
Session Number: 13

## 2024-09-08 MED ORDER — GABAPENTIN 100 MG PO CAPS
100.0000 mg | ORAL_CAPSULE | Freq: Three times a day (TID) | ORAL | 2 refills | Status: DC
  Filled 2024-09-08 (×2): qty 90, 30d supply, fill #0
  Filled 2024-10-10: qty 90, 30d supply, fill #1
  Filled 2024-11-04: qty 90, 30d supply, fill #2

## 2024-09-08 MED ORDER — CALCIUM CARBONATE 1500 (600 CA) MG PO TABS
1500.0000 mg | ORAL_TABLET | Freq: Two times a day (BID) | ORAL | 2 refills | Status: DC
  Filled 2024-09-08 – 2024-10-10 (×3): qty 60, 30d supply, fill #0
  Filled 2024-11-04 – 2024-11-16 (×2): qty 60, 30d supply, fill #1

## 2024-09-08 MED ORDER — CARVEDILOL 12.5 MG PO TABS
12.5000 mg | ORAL_TABLET | Freq: Two times a day (BID) | ORAL | 3 refills | Status: DC
  Filled 2024-09-08 (×2): qty 60, 30d supply, fill #0
  Filled 2024-10-08: qty 60, 30d supply, fill #1
  Filled 2024-10-31: qty 60, 30d supply, fill #2

## 2024-09-08 MED ORDER — LEVETIRACETAM 1000 MG PO TABS
1000.0000 mg | ORAL_TABLET | Freq: Two times a day (BID) | ORAL | 2 refills | Status: DC
  Filled 2024-09-08 (×2): qty 60, 30d supply, fill #0

## 2024-09-08 MED ORDER — MELATONIN 3 MG PO TABS
3.0000 mg | ORAL_TABLET | Freq: Every day | ORAL | 2 refills | Status: DC
  Filled 2024-09-08 – 2024-10-13 (×4): qty 30, 30d supply, fill #0

## 2024-09-11 ENCOUNTER — Other Ambulatory Visit (HOSPITAL_COMMUNITY): Payer: Self-pay

## 2024-09-11 ENCOUNTER — Other Ambulatory Visit: Payer: Self-pay | Admitting: Internal Medicine

## 2024-09-11 ENCOUNTER — Ambulatory Visit
Admission: RE | Admit: 2024-09-11 | Discharge: 2024-09-11 | Disposition: A | Source: Ambulatory Visit | Attending: Radiation Oncology

## 2024-09-11 ENCOUNTER — Other Ambulatory Visit: Payer: Self-pay

## 2024-09-11 ENCOUNTER — Ambulatory Visit
Admission: RE | Admit: 2024-09-11 | Discharge: 2024-09-11 | Disposition: A | Discharge status: Home / Self Care | Source: Ambulatory Visit | Attending: Radiation Oncology | Admitting: Radiation Oncology

## 2024-09-11 DIAGNOSIS — Z87891 Personal history of nicotine dependence: Secondary | ICD-10-CM | POA: Diagnosis not present

## 2024-09-11 DIAGNOSIS — Z510 Encounter for antineoplastic radiation therapy: Secondary | ICD-10-CM | POA: Diagnosis present

## 2024-09-11 DIAGNOSIS — C713 Malignant neoplasm of parietal lobe: Secondary | ICD-10-CM | POA: Diagnosis present

## 2024-09-11 LAB — RAD ONC ARIA SESSION SUMMARY
Course Elapsed Days: 19
Plan Fractions Treated to Date: 14
Plan Prescribed Dose Per Fraction: 2.67 Gy
Plan Total Fractions Prescribed: 15
Plan Total Prescribed Dose: 40.05 Gy
Reference Point Dosage Given to Date: 37.38 Gy
Reference Point Session Dosage Given: 2.67 Gy
Session Number: 14

## 2024-09-11 MED ORDER — DEXAMETHASONE 1 MG PO TABS
ORAL_TABLET | ORAL | 0 refills | Status: DC
  Filled 2024-09-11: qty 42, 21d supply, fill #0

## 2024-09-12 ENCOUNTER — Telehealth: Payer: Self-pay | Admitting: *Deleted

## 2024-09-12 ENCOUNTER — Other Ambulatory Visit: Payer: Self-pay

## 2024-09-12 ENCOUNTER — Ambulatory Visit
Admission: RE | Admit: 2024-09-12 | Discharge: 2024-09-12 | Disposition: A | Source: Ambulatory Visit | Attending: Radiation Oncology

## 2024-09-12 ENCOUNTER — Inpatient Hospital Stay: Attending: Internal Medicine | Admitting: Nurse Practitioner

## 2024-09-12 ENCOUNTER — Encounter: Payer: Self-pay | Admitting: Nurse Practitioner

## 2024-09-12 ENCOUNTER — Other Ambulatory Visit (HOSPITAL_COMMUNITY): Payer: Self-pay

## 2024-09-12 VITALS — BP 111/77 | HR 74 | Temp 97.9°F | Resp 17 | Wt 167.0 lb

## 2024-09-12 DIAGNOSIS — C719 Malignant neoplasm of brain, unspecified: Secondary | ICD-10-CM | POA: Diagnosis not present

## 2024-09-12 DIAGNOSIS — G893 Neoplasm related pain (acute) (chronic): Secondary | ICD-10-CM

## 2024-09-12 DIAGNOSIS — R530 Neoplastic (malignant) related fatigue: Secondary | ICD-10-CM

## 2024-09-12 DIAGNOSIS — Z515 Encounter for palliative care: Secondary | ICD-10-CM

## 2024-09-12 DIAGNOSIS — Z510 Encounter for antineoplastic radiation therapy: Secondary | ICD-10-CM | POA: Diagnosis not present

## 2024-09-12 LAB — RAD ONC ARIA SESSION SUMMARY
Course Elapsed Days: 20
Plan Fractions Treated to Date: 15
Plan Prescribed Dose Per Fraction: 2.67 Gy
Plan Total Fractions Prescribed: 15
Plan Total Prescribed Dose: 40.05 Gy
Reference Point Dosage Given to Date: 40.05 Gy
Reference Point Session Dosage Given: 2.67 Gy
Session Number: 15

## 2024-09-12 MED ORDER — OXYCODONE HCL 5 MG PO TABS
5.0000 mg | ORAL_TABLET | Freq: Four times a day (QID) | ORAL | 0 refills | Status: DC | PRN
  Filled 2024-09-12: qty 30, 8d supply, fill #0

## 2024-09-12 NOTE — Progress Notes (Signed)
 Palliative Medicine [facility]  [phone] [phone]   Name: Wayne Molina Date: 09/12/2024 MRN: [id number]  DOB: [birth year]  Patient Care Team: Janna Ferrier, DO as PCP - General (Family Medicine) Croitoru, Jerel, MD as PCP - Cardiology (Cardiology) Croitoru, Jerel, MD as Consulting Physician (Cardiology) Tobie Franky SQUIBB, DPM as Consulting Physician (Podiatry) Wayne Molina, Wayne SAILOR, Wayne Molina as Nurse Practitioner (Hospice and Palliative Medicine) Silvano Valrie SQUIBB, RN as Registered Nurse    INTERVAL HISTORY: Wayne Molina is a 68 y.o. male with oncologic medical history including right parietal glioblastoma with plans to begin 3 week course of IMRT with concurrent Temodar. Palliative is seeing patient for symptom management and goals of care.    SOCIAL HISTORY:     reports that he has quit smoking. His smoking use included cigarettes. He has never used smokeless tobacco. He reports that he does not drink alcohol and does not use drugs.  ADVANCE DIRECTIVES:  On file. His wife Wayne Molina is documented clinical research associate.   CODE STATUS: Limited code  PAST MEDICAL HISTORY: Past Medical History:  Diagnosis Date   Anxiety    Arthritis    fingers   Chronic atrial fibrillation (HCC)    Depression    Family history of breast cancer    Family history of cancer of male genital organ    Family history of gene mutation    BRIP1   Family history of malignant neoplasm of gastrointestinal tract    Hyperlipidemia    Hypertension    Personal history of colonic polyps 11/12/2006   Sleep apnea     ALLERGIES:  has no known allergies.  MEDICATIONS:  Current Outpatient Medications  Medication Sig Dispense Refill   acetaminophen  (TYLENOL ) 325 MG tablet Take 1-2 tablets (325-650 mg total) by mouth every 4 (four) hours as needed for mild pain (pain score 1-3).     apixaban  (ELIQUIS ) 5 MG TABS tablet Take 1 tablet (5 mg total) by mouth 2 (two)  times daily. 60 tablet 0   atorvastatin  (LIPITOR) 40 MG tablet Take 1 tablet (40 mg total) by mouth daily. 30 tablet 0   calcium  carbonate (OSCAL) 1500 (600 Ca) MG TABS tablet Take 1 tablet (1,500 mg total) by mouth 2 (two) times daily with a meal. 60 tablet 2   carvedilol  (COREG ) 12.5 MG tablet Take 1 tablet (12.5 mg total) by mouth 2 (two) times daily. 60 tablet 3   Cyanocobalamin (VITAMIN B-12 PO) Take 1 capsule by mouth daily.     dexamethasone  (DECADRON ) 1 MG tablet Take 3 tablets (3 mg total) by mouth daily with breakfast for 7 days, THEN 2 tablets (2 mg total) daily with breakfast for 7 days, THEN 1 tablet (1 mg total) daily with breakfast for 7 days. 42 tablet 0   furosemide  (LASIX ) 20 MG tablet Take 1 tablet (20 mg total) by mouth daily as needed for up to 14 days. Stop taking if swelling resolves 14 tablet 0   gabapentin  (NEURONTIN ) 100 MG capsule Take 1 capsule (100 mg total) by mouth 3 (three) times daily. 90 capsule 2   levETIRAcetam  (KEPPRA ) 1000 MG tablet Take 1 tablet (1,000 mg total) by mouth 2 (two) times daily. 60 tablet 2   LORazepam  (ATIVAN ) 0.5 MG tablet Take 1 tablet 30 minutes prior to radiation procedures or MRIs as needed to help with anxiety. (OK to take 2 tablets before MRIs). 18 tablet 0   Magnesium Oxide 250 MG TABS Take  1 tablet (250 mg total) by mouth daily in the afternoon. 30 tablet 0   melatonin 3 MG TABS tablet Take 1 tablet (3 mg total) by mouth at bedtime. 30 tablet 2   Multiple Vitamin (MULTIVITAMIN ADULT PO) Take 1 tablet by mouth daily.     Nystatin (GERHARDT'S BUTT CREAM) CREA Apply to affected area 60 each 0   ondansetron  (ZOFRAN ) 8 MG tablet Take 1 tablet (8 mg total) by mouth every 8 (eight) hours as needed for nausea or vomiting. May take 30-60 minutes prior to Temodar administration if nausea/vomiting occurs as needed. 30 tablet 1   oxyCODONE  (OXY IR/ROXICODONE ) 5 MG immediate release tablet Take 1 tablet (5 mg total) by mouth every 6 (six) hours as  needed for moderate pain (pain score 4-6). 30 tablet 0   pantoprazole  (PROTONIX ) 40 MG tablet Take 1 tablet (40 mg total) by mouth daily. 30 tablet 0   polyethylene glycol (MIRALAX  / GLYCOLAX ) 17 g packet Take 17 g by mouth 2 (two) times daily.     QUEtiapine  (SEROQUEL ) 25 MG tablet Take 1 tablet (25 mg total) by mouth daily. 30 tablet 0   tamsulosin  (FLOMAX ) 0.4 MG CAPS capsule Take 1 capsule (0.4 mg total) by mouth daily. 30 capsule 0   temozolomide (TEMODAR) 140 MG capsule Take 1 capsule (140 mg total) by mouth daily. May take on an empty stomach to decrease nausea & vomiting. 21 capsule 0   No current facility-administered medications for this visit.    VITAL SIGNS: BP 111/77 (BP Location: Left Arm, Patient Position: Sitting)   Pulse 74   Temp 97.9 F (36.6 C) (Temporal)   Resp 17   Wt 167 lb (75.8 kg)   SpO2 100%   BMI 25.39 kg/m  Filed Weights   09/12/24 1250  Weight: 167 lb (75.8 kg)    Estimated body mass index is 25.39 kg/m as calculated from the following:   Height as of 09/06/24: 5' 8 (1.727 m).   Weight as of this encounter: 167 lb (75.8 kg).   PERFORMANCE STATUS (ECOG) : 1 - Symptomatic but completely ambulatory   Physical Exam General: NAD Cardiovascular: regular rate and rhythm Pulmonary: normal breathing pattern Extremities: bilateral lower extremity edema, redness and scattered irritation sites, no joint deformities Skin: no rashes Neurological: AAO x3  IMPRESSION: Discussed the use of AI scribe software for clinical note transcription with the patient, who gave verbal consent to proceed.  History of Present Illness Wayne Molina is a 68 year old male with glioblastoma who presents to clinic for symptom management follow-up. He is scheduled for his last radiation treatment today. He is accompanied by his wife. No acute distress. Fatigue at times. Denies concerns for nausea, vomiting, constipation, or diarrhea.   He lasix  prescribed with parameters to  be taken if his blood pressure is above a certain threshold. He took one dose this morning which has helped with his lower extremity edema. He is concerned about the ongoing use of steroids and the impact on his leg swelling, as his legs are already small and the swelling makes them appear larger and tighter. Also attributing leg irritation. Discussed with Dr. Eward team. Education provided on recent prescription required to wean down safely and prevent complications after taking higher doses over the past several weeks. Wife verbalized understanding.  We discussed his pain. Mr. Kihara states his pain is managed and with some improvement however lower extremities with discomfort. He is taking his oxycodone  primarily at night  when the pain is most severe. We will continue to closely monitor. No adjustments at this time.   All questions answered and support provided.   Assessment & Plan Malignant neoplasm of brain Undergoing treatment with last radiation session scheduled for today. Timodar discontinued after last radiation session as per Radiation oncologist's advice.  Neoplasm related pain Experiencing pain, particularly at night, managed with oxycodone  as needed. - Continue oxycodone  5mg  every 6 hours as needed for pain management as needed  Edema and wounds of lower extremities Experiencing swelling and pain in legs. Wounds present on legs and ankles, managed with bandages. Concerns about increased swelling and potential for worsening wounds with continued steroid use. - Continue lasix  as prescribed - Continue bandaging wounds on legs and ankles -Patient and wife aware to continue dexamethasone  titration scheduled as confirmed with Dr. [name].   I will plan to follow-up in 1-2 weeks. Sooner if needed.    Patient expressed understanding and was in agreement with this plan. He also understands that He can call the clinic at any time with any questions, concerns, or complaints.   Any  controlled substances utilized were prescribed in the context of palliative care. PDMP has been reviewed.   Visit consisted of counseling and education dealing with the complex and emotionally intense issues of symptom management and palliative care in the setting of serious and potentially life-threatening illness.  Levon Borer, AGPCNP-BC  Palliative Medicine Team/[facility]

## 2024-09-12 NOTE — Telephone Encounter (Signed)
 PC to patient's wife Wayne Molina - informed her MRI brain has been scheduled on 10/05/27 at [facility] at 12:00, he is to arrive at 11:30.  His F/U appointment with Dr [name] is 10/09/24 at 10:00.  Wayne Molina asked about dexamethasone  taper which was prescribed yesterday.  Discussed dosages as prescribed. The rx states he is to begin taking 3 mg for the next 7 days, however, she states patient has only been taking 2 mg daily since 08/23/24.  Informed Wayne Molina patient may decrease dose to 1 mg daily for the next 7 days.  She verbalizes understanding.

## 2024-09-13 ENCOUNTER — Other Ambulatory Visit: Payer: Self-pay

## 2024-09-13 NOTE — Radiation Completion Notes (Signed)
 Patient Name: Wayne Molina, Wayne Molina MRN: [id number] Date of Birth: September 28, 1956 Referring Physician: RASHID JANJUA, M.D. Date of Service: 2024-09-13 Radiation Oncologist: Lauraine Golden, M.D. 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 Intent: Palliative     ==========DELIVERED PLANS==========  First Treatment Date: 2024-08-23 Last Treatment Date: 2024-09-12   Plan Name: Brain_R_[place] Site: Parietal Lobe Technique: IMRT Mode: Photon Dose Per Fraction: 2.67 Gy Prescribed Dose (Delivered / Prescribed): 40.05 Gy / 40.05 Gy Prescribed Fxs (Delivered / Prescribed): 15 / 15     ==========ON TREATMENT VISIT DATES========== 2024-08-28, 2024-09-04, 2024-09-08, 2024-09-11     ==========UPCOMING VISITS========== 11/26/2024 [facility] REMOTE PACER [facility] HVT DEVICE REMOTES  10/26/2024 [facility] REMOTE PACER [facility] HVT DEVICE REMOTES  10/17/2024 [facility]RADIATION ONC FOLLOW UP [address] Leeroy [place], [place]  10/09/2024 [facility]-MED ONCOLOGY PALLIATIVE CARE    [facility] PALLIATIVE CARE  10/09/2024 [facility]-MED ONCOLOGY EST PT 30 [name] Arthea POUR, [place]  10/09/2024 [facility] ONCOLOGY LAB [facility]-ONC LAB  10/04/2024 [facility] MR BRAIN W WO CONTRAST [facility]-MR 1  09/25/2024 [facility] HOME REMOTE PACER CK [facility] HVT DEVICE REMOTES  09/18/2024 [facility] FACULTY MEDICARE AWV, SEQUENTIAL [facility] ANNUAL WELLNESS VISIT        ==========APPENDIX - ON TREATMENT VISIT NOTES==========   See weekly On Treatment Notes in Epic for details in the Media tab (listed as Progress notes on the On Treatment Visit Dates listed above).

## 2024-09-13 NOTE — Progress Notes (Signed)
 Patient completed 21 day radiation and concurrent Temodar as of 09/11/24. Disenrolled.

## 2024-09-18 ENCOUNTER — Ambulatory Visit

## 2024-09-18 VITALS — Ht 68.0 in | Wt 167.0 lb

## 2024-09-18 DIAGNOSIS — Z0000 Encounter for general adult medical examination without abnormal findings: Secondary | ICD-10-CM | POA: Diagnosis not present

## 2024-09-18 NOTE — Patient Instructions (Signed)
 Wayne Molina,  Thank you for taking the time for your Medicare Wellness Visit. I appreciate your continued commitment to your health goals. Please review the care plan we discussed, and feel free to reach out if I can assist you further.  Please note that Annual Wellness Visits do not include a physical exam. Some assessments may be limited, especially if the visit was conducted virtually. If needed, we may recommend an in-person follow-up with your provider.  Ongoing Care Seeing your primary care provider every 3 to 6 months helps us  monitor your health and provide consistent, personalized care.   Referrals If a referral was made during today's visit and you haven't received any updates within two weeks, please contact the referred provider directly to check on the status.  Recommended Screenings:  Health Maintenance  Topic Date Due   COVID-19 Vaccine (4 - 2025-26 season) 09/22/2024*   Colon Cancer Screening  06/04/2025   DTaP/Tdap/Td vaccine (3 - Td or Tdap) 01/27/2028   Pneumococcal Vaccine for age over 70  Completed   Flu Shot  Completed   Hepatitis C Screening  Completed   Zoster (Shingles) Vaccine  Completed   Meningitis B Vaccine  Aged Out  *Topic was postponed. The date shown is not the original due date.       09/18/2024    1:14 PM  Advanced Directives  Does Patient Have a Medical Advance Directive? Yes  Does patient want to make changes to medical advance directive? No - Patient declined  Copy of Healthcare Power of Attorney in Chart? Yes - validated most recent copy scanned in chart (See row information)    Vision: Annual vision screenings are recommended for early detection of glaucoma, cataracts, and diabetic retinopathy. These exams can also reveal signs of chronic conditions such as diabetes and high blood pressure.  Dental: Annual dental screenings help detect early signs of oral cancer, gum disease, and other conditions linked to overall health, including heart  disease and diabetes.  Please see the attached documents for additional preventive care recommendations.

## 2024-09-18 NOTE — Progress Notes (Signed)
 I connected with  Wayne Molina on 09/18/24 by a audio enabled telemedicine application and verified that I am speaking with the correct person using two identifiers.  Patient Location: Home  Provider Location: Office/Clinic  Persons Participating in Visit: Patient assisted by wife.  I discussed the limitations of evaluation and management by telemedicine. The patient expressed understanding and agreed to proceed.   Vital Signs: Because this visit was a virtual/telehealth visit, some criteria may be missing or patient reported. Any vitals not documented were not able to be obtained and vitals that have been documented are patient reported.   If you're able to add these things as option in the [employer], we won't need it ... but for those who refuse to use the template, I guess it does need to be updated    Subjective:   Wayne Molina is a 68 y.o. male who presents for a Medicare Annual Wellness Visit.  Allergies (verified) Patient has no known allergies.   History: Past Medical History:  Diagnosis Date   Anxiety    Arthritis    fingers   Chronic atrial fibrillation (HCC)    Depression    Family history of breast cancer    Family history of cancer of male genital organ    Family history of gene mutation    BRIP1   Family history of malignant neoplasm of gastrointestinal tract    Hyperlipidemia    Hypertension    Personal history of colonic polyps 11/12/2006   Sleep apnea    Past Surgical History:  Procedure Laterality Date   2-D echocardiogram  07/22/2010   Ejection fraction greater than 55%. Left atrium moderately dilated. Right atrium moderately dilated. 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 Mild to Moderate MR. Mild to moderate TR.   APPLICATION OF CRANIAL NAVIGATION Right 07/17/2024   Procedure: COMPUTER-ASSISTED NAVIGATION, FOR CRANIAL PROCEDURE;  Surgeon: Rosslyn Dino HERO, MD;  Location: [facility] OR;  Service: Neurosurgery;  Laterality: Right;  CRANIAL  NAVIGATION   ATRIAL ABLATION SURGERY  11/09/2002   EP IMPLANTABLE DEVICE N/A 02/11/2016   Procedure: Loop Recorder Insertion;  Surgeon: Jerel Balding, MD;  Location: [facility] INVASIVE CV LAB;  Service: Cardiovascular;  Laterality: N/A;   Exercise Myoview stress test  07/08/2000   Nonischemic low-risk.   HIP FRACTURE SURGERY  1970s   IR GENERIC HISTORICAL  01/21/2017   IR RADIOLOGIST EVAL & MGMT 01/21/2017 [facility]INTER[facility] RAD   STERIOTACTIC STIMULATOR INSERTION Right 07/17/2024   Procedure: OPEN CRANIOTOMY FOR BIOPSY;  Surgeon: Rosslyn Dino HERO, MD;  Location: [facility] OR;  Service: Neurosurgery;  Laterality: Right;  RIGHT STEREOTACTIC BRAIN BIOPSY   Family History  Problem Relation Age of Onset   Dementia Mother    Heart disease Father    Diabetes Father    Other Sister        BRIP1 gene mutation   Kidney disease Brother    Colon cancer Maternal Uncle    Lung cancer Paternal Aunt        d. >50   Cancer Paternal Aunt        unknown type, d. >50   Cancer Cousin 17       gynecologic (paternal first cousin)   Colon cancer Nephew 27       arising in colon polyp   Cancer Niece 42       gynecologic; niece in her 13's   Breast cancer Niece 69   Other Niece        BRIP1  gene mutation   Cancer Paternal Great-grandmother        abdominal cancer (PGF's mother) great grandmother   Social History   Occupational History   Not on file  Tobacco Use   Smoking status: Former    Current packs/day: 1.00    Types: Cigarettes   Smokeless tobacco: Never   Tobacco comments:    Smoked for approximately 6 months in his life  Vaping Use   Vaping status: Never Used  Substance and Sexual Activity   Alcohol use: No   Drug use: No   Sexual activity: Not Currently   Tobacco Counseling Counseling given: Not Answered Tobacco comments: Smoked for approximately 6 months in his life  SDOH Screenings   Food Insecurity: No Food Insecurity (09/18/2024)  Housing: Low Risk  (09/18/2024)  Transportation Needs:  Unmet Transportation Needs (09/18/2024)  Utilities: Not At Risk (09/18/2024)  Alcohol Screen: Low Risk  (04/16/2023)  Depression (PHQ2-9): High Risk (09/18/2024)  Financial Resource Strain: Low Risk  (03/22/2024)  Physical Activity: Sufficiently Active (09/18/2024)  Social Connections: Moderately Integrated (09/18/2024)  Stress: No Stress Concern Present (09/18/2024)  Tobacco Use: Medium Risk (09/18/2024)  Health Literacy: Adequate Health Literacy (09/18/2024)   Depression Screen    09/18/2024    1:32 PM 09/06/2024   10:06 AM 09/04/2024    3:20 PM 08/29/2024   10:05 AM 08/14/2024    2:24 PM 08/11/2024    2:25 PM 08/10/2024   10:43 AM  PHQ 2/9 Scores  PHQ - 2 Score 3 3 0 0 3 0 0  PHQ- 9 Score 12 12    15         Data saved with a previous flowsheet row definition     Goals Addressed             This Visit's Progress    09/18/2024: To get stronger with walking and other ADLs.         Visit info / Clinical Intake: Medicare Wellness Visit Type:: Initial Annual Wellness Visit Persons participating in visit:: patient Medicare Wellness Visit Mode:: Telephone If telephone:: video error Because this visit was a virtual/telehealth visit:: pt reported vitals If Telephone or Video please confirm:: I connected with the patient using audio enabled telemedicine application and verified that I am speaking with the correct person using two identifiers; The patient expressed understanding and agreed to proceed; I discussed the limitations of evaluation and management by telemedicine Patient Location:: HOME Provider Location:: [facility] Information given by:: patient Interpreter Needed?: No Pre-visit prep was completed: yes AWV questionnaire completed by patient prior to visit?: no Living arrangements:: lives with spouse/significant other Patient's Overall Health Status Rating: (!) fair Typical amount of pain: none Does pain affect daily life?: no Are you currently prescribed  opioids?: (!) yes  Dietary Habits and Nutritional Risks How many meals a day?: 3 (SNACKS) Eats fruit and vegetables daily?: yes Most meals are obtained by: preparing own meals Diabetic:: no  Functional Status Activities of Daily Living (to include ambulation/medication): (!) Needs Assist Feeding: Independent Dressing/Grooming: Needs assistance Bathing: Needs assistance Toileting: Independent Transfer: Independent Ambulation: Independent with device- listed below Home Assistive Devices/Equipment: Walker (specify Type); Beside Commode; Shower/tub chair; Elevated toliet seat Medication Administration: Independent Home Management: Needs assistance (comment) Manage your own finances?: (!) no (WIFE) Primary transportation is: family/friends (NOT ABLE TO DRIVE) Concerns about vision?: (!) yes Concerns about hearing?: no  Fall Screening Falls in the past year?: 1 Number of falls in past year: 0 Was there  an injury with Fall?: 0 Fall Risk Category Calculator: 1 Patient Fall Risk Level: Low Fall Risk  Fall Risk Patient at Risk for Falls Due to: No Fall Risks Fall risk Follow up: Falls evaluation completed; Education provided  Home and Transportation Safety: All rugs have non-skid backing?: yes All stairs or steps have railings?: N/A, no stairs Grab bars in the bathtub or shower?: yes Have non-skid surface in bathtub or shower?: yes Good home lighting?: yes Regular seat belt use?: yes Hospital stays in the last year:: (!) yes How many hospital stays:: 1 Reason: GBM  Cognitive Assessment Difficulty concentrating, remembering, or making decisions? : yes (CONCENTRATING) Will 6CIT or Mini Cog be Completed: no 6CIT or Mini Cog Declined: patient alert, oriented, able to answer questions appropriately and recall recent events  Advance Directives (For Healthcare) Does Patient Have a Medical Advance Directive?: Yes Does patient want to make changes to medical advance directive?: No -  Patient declined Type of Advance Directive: Living will; Healthcare Power of Attorney Copy of Healthcare Power of Attorney in Chart?: Yes - validated most recent copy scanned in chart (See row information) Copy of Living Will in Chart?: Yes - validated most recent copy scanned in chart (See row information) Would patient like information on creating a medical advance directive?: Yes (Inpatient - patient requests chaplain consult to create a medical advance directive)  Reviewed/Updated  Reviewed/Updated: Reviewed All (Medical, Surgical, Family, Medications, Allergies, Care Teams, Patient Goals)        Objective:    Today's Vitals   09/18/24 1312  Weight: 167 lb (75.8 kg)  Height: 5' 8 (1.727 m)  PainSc: 0-No pain   Body mass index is 25.39 kg/m.  Current Medications (verified) Outpatient Encounter Medications as of 09/18/2024  Medication Sig   acetaminophen  (TYLENOL ) 325 MG tablet Take 1-2 tablets (325-650 mg total) by mouth every 4 (four) hours as needed for mild pain (pain score 1-3).   apixaban  (ELIQUIS ) 5 MG TABS tablet Take 1 tablet (5 mg total) by mouth 2 (two) times daily.   atorvastatin  (LIPITOR) 40 MG tablet Take 1 tablet (40 mg total) by mouth daily.   calcium  carbonate (OSCAL) 1500 (600 Ca) MG TABS tablet Take 1 tablet (1,500 mg total) by mouth 2 (two) times daily with a meal.   carvedilol  (COREG ) 12.5 MG tablet Take 1 tablet (12.5 mg total) by mouth 2 (two) times daily.   Cyanocobalamin (VITAMIN B-12 PO) Take 1 capsule by mouth daily.   dexamethasone  (DECADRON ) 1 MG tablet Take 3 tablets (3 mg total) by mouth daily with breakfast for 7 days, THEN 2 tablets (2 mg total) daily with breakfast for 7 days, THEN 1 tablet (1 mg total) daily with breakfast for 7 days.   furosemide  (LASIX ) 20 MG tablet Take 1 tablet (20 mg total) by mouth daily as needed for up to 14 days. Stop taking if swelling resolves   gabapentin  (NEURONTIN ) 100 MG capsule Take 1 capsule (100 mg total) by  mouth 3 (three) times daily.   levETIRAcetam  (KEPPRA ) 1000 MG tablet Take 1 tablet (1,000 mg total) by mouth 2 (two) times daily.   LORazepam  (ATIVAN ) 0.5 MG tablet Take 1 tablet 30 minutes prior to radiation procedures or MRIs as needed to help with anxiety. (OK to take 2 tablets before MRIs).   Magnesium Oxide 250 MG TABS Take 1 tablet (250 mg total) by mouth daily in the afternoon.   melatonin 3 MG TABS tablet Take 1 tablet (3 mg total) by  mouth at bedtime.   Multiple Vitamin (MULTIVITAMIN ADULT PO) Take 1 tablet by mouth daily.   Nystatin (GERHARDT'S BUTT CREAM) CREA Apply to affected area   ondansetron  (ZOFRAN ) 8 MG tablet Take 1 tablet (8 mg total) by mouth every 8 (eight) hours as needed for nausea or vomiting. May take 30-60 minutes prior to Temodar administration if nausea/vomiting occurs as needed.   oxyCODONE  (OXY IR/ROXICODONE ) 5 MG immediate release tablet Take 1 tablet (5 mg total) by mouth every 6 (six) hours as needed for moderate pain (pain score 4-6).   pantoprazole  (PROTONIX ) 40 MG tablet Take 1 tablet (40 mg total) by mouth daily.   polyethylene glycol (MIRALAX  / GLYCOLAX ) 17 g packet Take 17 g by mouth 2 (two) times daily.   QUEtiapine  (SEROQUEL ) 25 MG tablet Take 1 tablet (25 mg total) by mouth daily.   tamsulosin  (FLOMAX ) 0.4 MG CAPS capsule Take 1 capsule (0.4 mg total) by mouth daily.   temozolomide (TEMODAR) 140 MG capsule Take 1 capsule (140 mg total) by mouth daily. May take on an empty stomach to decrease nausea & vomiting.   No facility-administered encounter medications on file as of 09/18/2024.   Hearing/Vision screen Hearing Screening - Comments:: Denies hearing difficulties.  Vision Screening - Comments:: No rx glasses - not up to date with routine eye exams. Defer to PCP for referral.  Immunizations and Health Maintenance Health Maintenance  Topic Date Due   COVID-19 Vaccine (4 - 2025-26 season) 09/22/2024 (Originally 07/10/2024)   Colonoscopy  06/04/2025    DTaP/Tdap/Td (3 - Td or Tdap) 01/27/2028   Pneumococcal Vaccine: 50+ Years  Completed   Influenza Vaccine  Completed   Hepatitis C Screening  Completed   Zoster Vaccines- Shingrix   Completed   Meningococcal B Vaccine  Aged Out        Assessment/Plan:  This is a routine wellness examination for Wayne Molina.  Patient Care Team: Janna Ferrier, DO as PCP - General (Family Medicine) Croitoru, Jerel, MD as PCP - Cardiology (Cardiology) Croitoru, Jerel, MD as Consulting Physician (Cardiology) Tobie Franky SQUIBB, DPM as Consulting Physician (Podiatry) Pickenpack-Cousar, Fannie SAILOR, NP as Nurse Practitioner (Hospice and Palliative Medicine) Silvano Valrie SQUIBB, RN as Registered Nurse  I have personally reviewed and noted the following in the patient's chart:   Medical and social history Use of alcohol, tobacco or illicit drugs  Current medications and supplements including opioid prescriptions. Functional ability and status Nutritional status Physical activity Advanced directives List of other physicians Hospitalizations, surgeries, and ER visits in previous 12 months Vitals Screenings to include cognitive, depression, and falls Referrals and appointments  No orders of the defined types were placed in this encounter.  In addition, I have reviewed and discussed with patient certain preventive protocols, quality metrics, and best practice recommendations. A written personalized care plan for preventive services as well as general preventive health recommendations were provided to patient.   Roz SAILOR Fuller, LPN   88/89/7974   Return in 1 year (on 09/18/2025).  After Visit Summary: (MyChart) Due to this being a telephonic visit, the after visit summary with patients personalized plan was offered to patient via MyChart   Nurse Notes: Patient is up to date with care gaps.  Patient has not had eye exam in years.  Please refer patient for routine eye exam.

## 2024-09-20 ENCOUNTER — Other Ambulatory Visit (HOSPITAL_COMMUNITY): Payer: Self-pay

## 2024-09-25 ENCOUNTER — Other Ambulatory Visit (HOSPITAL_COMMUNITY): Payer: Self-pay

## 2024-09-25 ENCOUNTER — Ambulatory Visit

## 2024-09-25 ENCOUNTER — Other Ambulatory Visit: Payer: Self-pay

## 2024-09-25 ENCOUNTER — Other Ambulatory Visit: Payer: Self-pay | Admitting: *Deleted

## 2024-09-25 ENCOUNTER — Telehealth: Payer: Self-pay

## 2024-09-25 ENCOUNTER — Telehealth: Payer: Self-pay | Admitting: *Deleted

## 2024-09-25 DIAGNOSIS — R55 Syncope and collapse: Secondary | ICD-10-CM

## 2024-09-25 LAB — CUP PACEART REMOTE DEVICE CHECK
Date Time Interrogation Session: 20251116233115
Implantable Pulse Generator Implant Date: 20240111

## 2024-09-25 MED ORDER — DEXAMETHASONE 4 MG PO TABS
4.0000 mg | ORAL_TABLET | Freq: Every day | ORAL | 0 refills | Status: DC
  Filled 2024-09-25: qty 7, 7d supply, fill #0

## 2024-09-25 NOTE — Telephone Encounter (Signed)
 Patients wife called to report increased sleepiness/fatigue/ decreased appetite and weaker with ambulation.  Denies any new seizures, is adhering strictly to seizure medication.  Reports was dosing Decadron  1 mg once daily from 08/23/2024 - 09/20/2024 and then stopped.  Per Dr [name] lets try dosing Decadron  4 mg once daily X 1 week and then do a phone visit to follow up in 1 week.  Wife states understanding.

## 2024-09-25 NOTE — Telephone Encounter (Signed)
 Patient's wife LVM on nurse line regarding concerns with patient having weakness and grogginess.   Returned call to wife.   Wife reports that patient has brain cancer and has finished his radiation treatments (total of 15).   She reports that over the weekend, patient has been sleeping more than usual. Also reports decreased appetite and weakness.   She denies facial drooping, headaches, slurred speech, arm weakness. No fever or pain.   Advised that patient receive evaluation. Scheduled for this PM in ATC.   Also advised that wife contact the oncologist so they are aware of this as well.   Strict ED precautions discussed.   Chiquita JAYSON English, RN

## 2024-09-27 ENCOUNTER — Other Ambulatory Visit (HOSPITAL_COMMUNITY): Payer: Self-pay

## 2024-09-27 NOTE — Progress Notes (Signed)
 Remote Loop Recorder Transmission

## 2024-09-29 ENCOUNTER — Ambulatory Visit: Admitting: Family Medicine

## 2024-09-29 ENCOUNTER — Telehealth: Payer: Self-pay | Admitting: Pharmacist

## 2024-09-29 DIAGNOSIS — C713 Malignant neoplasm of parietal lobe: Secondary | ICD-10-CM

## 2024-09-29 NOTE — Progress Notes (Signed)
 [facility] Telemedicine Visit  Patient consented to have virtual visit and was identified by name and date of birth. Method of visit: Telephone  Encounter participants: Patient: Wayne Molina - located at home, spoke with his Wayne Molina Provider: Kathrine Melena - located at [facility]  Chief Complaint: FMLA paperwork  HPI: Needed paperwork filled out for short term disability through end of December when he retires.  No concerns at this time.  Reportedly doing all right based on his wife and his own report.  ROS: per HPI  Pertinent PMHx: Glioblastoma of parietal lobe, A-fib, HTN, VTE  Exam:  There were no vitals taken for this visit.  Respiratory: No respiratory distress  Assessment & Plan Glioblastoma of parietal lobe (HCC) Spoke with [name] Sharps, patient's wife to discuss FMLA paperwork.  Reportedly he is seeking short-term disability through the end of December when he plans to retire after being diagnosed with glioblastoma in August that year and having multiple rounds of radiation/chemotherapy performed thereafter.  Form was completed and will be placed and to be faxed container.  Pictures can be found in the media tab  Time spent during visit with patient: 17 minutes  Kathrine Melena, DO 09/29/24 3:41 PM

## 2024-09-29 NOTE — Assessment & Plan Note (Signed)
 Spoke with [name] Sharps, patient's wife to discuss FMLA paperwork.  Reportedly he is seeking short-term disability through the end of December when he plans to retire after being diagnosed with glioblastoma in August that year and having multiple rounds of radiation/chemotherapy performed thereafter.  Form was completed and will be placed and to be faxed container.  Pictures can be found in the media tab

## 2024-09-29 NOTE — Telephone Encounter (Signed)
 Reviewed and agree with Dr Rennis plan.

## 2024-09-29 NOTE — Telephone Encounter (Signed)
 Patient contacted for follow-up of adherence QI for hypertension medication Losartan  100mg   Since last contact patient reports that he does not have Losartan  currently.   Upon further review, Losartan  was discontinued by Dr. Crotuiro.  Total time with patient call and documentation of interaction: 8 minutes.

## 2024-10-02 ENCOUNTER — Ambulatory Visit: Payer: Self-pay | Admitting: Cardiovascular Disease

## 2024-10-02 ENCOUNTER — Telehealth: Payer: Self-pay | Admitting: *Deleted

## 2024-10-02 ENCOUNTER — Encounter (HOSPITAL_COMMUNITY): Payer: Self-pay

## 2024-10-02 NOTE — Telephone Encounter (Signed)
 Completed [employer] FMLA paperwork   Sent to provider to review, amend, sign and return to this nurse to return to claims benefit manager.

## 2024-10-03 ENCOUNTER — Other Ambulatory Visit: Payer: Self-pay

## 2024-10-03 ENCOUNTER — Inpatient Hospital Stay: Admitting: Internal Medicine

## 2024-10-03 ENCOUNTER — Other Ambulatory Visit (HOSPITAL_COMMUNITY): Payer: Self-pay

## 2024-10-03 DIAGNOSIS — C719 Malignant neoplasm of brain, unspecified: Secondary | ICD-10-CM

## 2024-10-03 MED ORDER — DEXAMETHASONE 1 MG PO TABS
ORAL_TABLET | ORAL | 0 refills | Status: AC
  Filled 2024-10-03: qty 42, 21d supply, fill #0

## 2024-10-03 MED ORDER — LEVETIRACETAM 1000 MG PO TABS
1000.0000 mg | ORAL_TABLET | Freq: Two times a day (BID) | ORAL | 2 refills | Status: DC
  Filled 2024-10-03: qty 60, 30d supply, fill #0
  Filled 2024-10-31: qty 60, 30d supply, fill #1

## 2024-10-03 NOTE — Progress Notes (Signed)
 I connected with Terril LITTIE Sharps on 10/03/24 at  9:45 AM EST by telephone visit and verified that I am speaking with the correct person using two identifiers.  I discussed the limitations, risks, security and privacy concerns of performing an evaluation and management service by telemedicine and the availability of in-person appointments. I also discussed with the patient that there may be a patient responsible charge related to this service. The patient expressed understanding and agreed to proceed.  Other persons participating in the visit and their role in the encounter:  spouse   Patient's location:  Home Provider's location:  Office Chief Complaint:  Glioblastoma, IDH-wildtype (HCC)  History of Present Ilness: YOSHIHARU BRASSELL reports clinical improvement since resuming the decadron  4mg  daily.  His energy is better as well as overall function.  No issues or side effects thus far.  MRI is scheduled for tomorrow.  Observations: Language and cognition at baseline  Assessment and Plan: Glioblastoma, IDH-wildtype (HCC)  Clinically improved with decadron .  Recommended decreasing decadron  by 1mg  each week, starting with 3mg  daily tomorrow.  Dose may be modified if focal symptoms recur.  Follow Up Instructions: We ask that AUTUMN GUNN return to clinic in 1 week following next brain MRI, or sooner as needed.  I discussed the assessment and treatment plan with the patient.  The patient was provided an opportunity to ask questions and all were answered.  The patient agreed with the plan and demonstrated understanding of the instructions.    The patient was advised to call back or seek an in-person evaluation if the symptoms worsen or if the condition fails to improve as anticipated.    Kawon Willcutt [facility] [place], MD   I provided 20 minutes of non face-to-face telephone visit time during this encounter, and > 50% was spent counseling as documented under my assessment & plan.

## 2024-10-04 ENCOUNTER — Ambulatory Visit (HOSPITAL_COMMUNITY)
Admission: RE | Admit: 2024-10-04 | Discharge: 2024-10-04 | Disposition: A | Discharge status: Home / Self Care | Source: Ambulatory Visit | Attending: Internal Medicine | Admitting: Internal Medicine

## 2024-10-04 DIAGNOSIS — C719 Malignant neoplasm of brain, unspecified: Secondary | ICD-10-CM | POA: Diagnosis present

## 2024-10-04 MED ORDER — GADOBUTROL 1 MMOL/ML IV SOLN
7.0000 mL | Freq: Once | INTRAVENOUS | Status: AC | PRN
  Administered 2024-10-04: 7 mL via INTRAVENOUS

## 2024-10-04 NOTE — Telephone Encounter (Signed)
 10/02/2024 Completed [employer] paperwork sent to provider to review, make any needed amendments, sign and return to form staff.   10/03/2024 Signed paperwork returned to this nurse.   Successfully returned via [employer] [phone])..   No request received for medical records.  Copy to [facility] HIM bin designated for items to be scanned to EMR.   Prepared paperwork to be mailed to address on file. [address] [place] [address] Message left [phone] (home) for patient notification of above  information.. Process completed with no further instructions received, actions performed or required by this nurse.

## 2024-10-06 ENCOUNTER — Other Ambulatory Visit (HOSPITAL_COMMUNITY): Payer: Self-pay

## 2024-10-06 ENCOUNTER — Other Ambulatory Visit: Payer: Self-pay | Admitting: Family Medicine

## 2024-10-08 ENCOUNTER — Other Ambulatory Visit: Payer: Self-pay

## 2024-10-08 ENCOUNTER — Other Ambulatory Visit (HOSPITAL_COMMUNITY): Payer: Self-pay

## 2024-10-09 ENCOUNTER — Encounter: Payer: Self-pay | Admitting: Nurse Practitioner

## 2024-10-09 ENCOUNTER — Inpatient Hospital Stay: Admitting: Internal Medicine

## 2024-10-09 ENCOUNTER — Other Ambulatory Visit: Payer: Self-pay

## 2024-10-09 ENCOUNTER — Other Ambulatory Visit (HOSPITAL_COMMUNITY): Payer: Self-pay

## 2024-10-09 ENCOUNTER — Telehealth: Payer: Self-pay | Admitting: Pharmacist

## 2024-10-09 ENCOUNTER — Inpatient Hospital Stay (HOSPITAL_BASED_OUTPATIENT_CLINIC_OR_DEPARTMENT_OTHER): Admitting: Nurse Practitioner

## 2024-10-09 ENCOUNTER — Inpatient Hospital Stay: Attending: Internal Medicine

## 2024-10-09 VITALS — BP 122/78 | HR 67 | Temp 97.1°F | Resp 17 | Ht 68.0 in | Wt 168.0 lb

## 2024-10-09 DIAGNOSIS — Z515 Encounter for palliative care: Secondary | ICD-10-CM | POA: Diagnosis not present

## 2024-10-09 DIAGNOSIS — C713 Malignant neoplasm of parietal lobe: Secondary | ICD-10-CM | POA: Insufficient documentation

## 2024-10-09 DIAGNOSIS — Z87891 Personal history of nicotine dependence: Secondary | ICD-10-CM | POA: Diagnosis not present

## 2024-10-09 DIAGNOSIS — G893 Neoplasm related pain (acute) (chronic): Secondary | ICD-10-CM | POA: Diagnosis not present

## 2024-10-09 DIAGNOSIS — C719 Malignant neoplasm of brain, unspecified: Secondary | ICD-10-CM

## 2024-10-09 DIAGNOSIS — R530 Neoplastic (malignant) related fatigue: Secondary | ICD-10-CM

## 2024-10-09 LAB — CBC WITH DIFFERENTIAL (CANCER CENTER ONLY)
Abs Immature Granulocytes: 0.2 K/uL — ABNORMAL HIGH (ref 0.00–0.07)
Basophils Absolute: 0 K/uL (ref 0.0–0.1)
Basophils Relative: 0 %
Eosinophils Absolute: 0 K/uL (ref 0.0–0.5)
Eosinophils Relative: 0 %
HCT: 41 % (ref 39.0–52.0)
Hemoglobin: 13.8 g/dL (ref 13.0–17.0)
Immature Granulocytes: 2 %
Lymphocytes Relative: 15 %
Lymphs Abs: 1.7 K/uL (ref 0.7–4.0)
MCH: 31.6 pg (ref 26.0–34.0)
MCHC: 33.7 g/dL (ref 30.0–36.0)
MCV: 93.8 fL (ref 80.0–100.0)
Monocytes Absolute: 0.8 K/uL (ref 0.1–1.0)
Monocytes Relative: 7 %
Neutro Abs: 8.6 K/uL — ABNORMAL HIGH (ref 1.7–7.7)
Neutrophils Relative %: 76 %
Platelet Count: 198 K/uL (ref 150–400)
RBC: 4.37 MIL/uL (ref 4.22–5.81)
RDW: 16.1 % — ABNORMAL HIGH (ref 11.5–15.5)
WBC Count: 11.3 K/uL — ABNORMAL HIGH (ref 4.0–10.5)
nRBC: 0 % (ref 0.0–0.2)

## 2024-10-09 LAB — CMP (CANCER CENTER ONLY)
ALT: 37 U/L (ref 0–44)
AST: 25 U/L (ref 15–41)
Albumin: 4.1 g/dL (ref 3.5–5.0)
Alkaline Phosphatase: 53 U/L (ref 38–126)
Anion gap: 10 (ref 5–15)
BUN: 25 mg/dL — ABNORMAL HIGH (ref 8–23)
CO2: 25 mmol/L (ref 22–32)
Calcium: 9.6 mg/dL (ref 8.9–10.3)
Chloride: 102 mmol/L (ref 98–111)
Creatinine: 0.9 mg/dL (ref 0.61–1.24)
GFR, Estimated: >60 mL/min (ref >60)
Glucose, Bld: 140 mg/dL — ABNORMAL HIGH (ref 70–99)
Potassium: 4.2 mmol/L (ref 3.5–5.1)
Sodium: 138 mmol/L (ref 135–145)
Total Bilirubin: 1.6 mg/dL — ABNORMAL HIGH (ref 0.0–1.2)
Total Protein: 6.7 g/dL (ref 6.5–8.1)

## 2024-10-09 MED ORDER — PANTOPRAZOLE SODIUM 40 MG PO TBEC
40.0000 mg | DELAYED_RELEASE_TABLET | Freq: Every day | ORAL | 0 refills | Status: DC
  Filled 2024-10-09: qty 30, 30d supply, fill #0
  Filled 2024-10-09: qty 90, 90d supply, fill #0

## 2024-10-09 MED ORDER — TEMOZOLOMIDE 140 MG PO CAPS
150.0000 mg/m2/d | ORAL_CAPSULE | Freq: Every day | ORAL | 0 refills | Status: DC
  Filled 2024-10-09: qty 10, 5d supply, fill #0
  Filled 2024-10-10: qty 10, 28d supply, fill #0

## 2024-10-09 MED ORDER — TEMOZOLOMIDE 140 MG PO CAPS: 150.0000 mg/m2/d | ORAL_CAPSULE | Freq: Every day | ORAL | 0 refills | Status: DC

## 2024-10-09 MED ORDER — TAMSULOSIN HCL 0.4 MG PO CAPS
0.4000 mg | ORAL_CAPSULE | Freq: Every day | ORAL | 0 refills | Status: DC
  Filled 2024-10-09: qty 90, 90d supply, fill #0
  Filled 2024-10-09: qty 30, 30d supply, fill #0

## 2024-10-09 MED ORDER — QUETIAPINE FUMARATE 25 MG PO TABS
25.0000 mg | ORAL_TABLET | Freq: Every day | ORAL | 0 refills | Status: DC
  Filled 2024-10-09: qty 90, 90d supply, fill #0

## 2024-10-09 MED ORDER — ONDANSETRON HCL 8 MG PO TABS
8.0000 mg | ORAL_TABLET | Freq: Three times a day (TID) | ORAL | 1 refills | Status: DC | PRN
  Filled 2024-10-09: qty 30, 10d supply, fill #0

## 2024-10-09 MED ORDER — OXYCODONE HCL 5 MG PO TABS
5.0000 mg | ORAL_TABLET | Freq: Four times a day (QID) | ORAL | 0 refills | Status: DC | PRN
  Filled 2024-10-09: qty 30, 8d supply, fill #0

## 2024-10-09 NOTE — Telephone Encounter (Signed)
 Oral Oncology Pharmacist Encounter  Received new prescription for Temodar  (temozolomide ) for the maintenance treatment of glioblastoma, IDH-WT, planned duration until disease progression or unacceptable drug toxicity.  CBC w/ Diff and CMP from 10/09/24 assessed, noted patietn T. Bili 1.6mg /dL, but AST/ALT WNL. No hepatic dose adjustments required for Temodar . Prescription dose and frequency assessed for appropriateness.  Current medication list in Epic reviewed, no relevant/significant DDIs with Temodar  identified.  Evaluated chart and no patient barriers to medication adherence noted.   Patient agreement for treatment documented in MD note on 10/09/24.  Prescription has been e-scribed to the [facility] for benefits analysis and approval.  Oral Oncology Clinic will continue to follow for insurance authorization, copayment issues, initial counseling and start date.  Wayne Molina, PharmD, BCPS, BCOP Hematology/Oncology Clinical Pharmacist [phone] 10/09/2024 10:38 AM

## 2024-10-09 NOTE — Telephone Encounter (Signed)
 Oral Chemotherapy Pharmacist Encounter  I spoke with patient's wife, Wayne Molina, for overview of: Temodar  (temozolomide ) for the maintenance treatment of glioblastoma, planned duration 6-12 months of treatment.  Treatment goal: Palliative  Counseled on administration, dosing, side effects, monitoring, drug-food interactions, safe handling, storage, and disposal.  Patient will take Temodar  140mg  capsules (two capsules), 280mg  total daily dose, by mouth once daily, may take at bedtime and on an empty stomach to decrease nausea and vomiting. Patient will take Temodar  daily for 5 days on, 23 days off, and repeated.  If 1st cycle is well tolerated, patient's wife informed that Temodar  dose may be increased to 200 mg/m2 daily for 5 days on, 23 days off, repeated every 28 days for subsequent cycles   Temodar  start date: 10/16/24 PM    Adverse effects include but are not limited to: nausea, vomiting, GI upset, rash, and fatigue.  Nausea/Vomiting PPX: Patient will take Zofran  8mg  tablet, 1 tablet by mouth 30-60 min prior to Temodar  dose to help decrease N/V. We discussed strategies to manage constipation if they occur secondary to ondansetron  dosing.  Reviewed importance of keeping a medication schedule and plan for any missed doses. No barriers to medication adherence identified.  Medication reconciliation performed and medication/allergy list updated.  Distress thermometer flowsheet: Distress thermometer not completed during telephone call as patient has been on previous lines of therapy.   Communication and Learning Assessment Primary learner: Patient's spouse Barriers to learning: No barriers Preferred language: English Learning preferences: Listening Reading  All questions answered.  Patient's wife voiced understanding and appreciation.   Medication education handout placed in mail for patient. Patient's wife knows to call the office with questions or concerns. Oral Chemotherapy Clinic  phone number provided.   Wayne Molina, PharmD, BCPS, BCOP Hematology/Oncology Clinical Pharmacist [phone] 10/09/2024 11:44 AM

## 2024-10-09 NOTE — Progress Notes (Signed)
 Specialty Pharmacy Initial Fill Coordination Note  Wayne Molina is a 68 y.o. male contacted today regarding refills of specialty medication(s) Temozolomide  (TEMODAR ) .  Patient requested Delivery  on 10/11/24  to verified address [address]   [place]  [id number]   Medication will be filled on 10/10/24.   Patient is aware of $0.00 copayment.   Please deliver out any medications that are currently being worked on for him as soon as you can, he is out of his tamsulosin .  Lucie Lamer, CPhT [place]  [facility] [place] [facility] Oncology Pharmacy Patient Advocate Specialist [address] [place] Phone: [phone]  Fax: [phone] [email or web address]

## 2024-10-09 NOTE — Progress Notes (Signed)
 Palliative Medicine [facility]  [phone] [phone]   Name: Wayne Molina Date: 10/09/2024 MRN: [id number]  DOB: [birth year]  Patient Care Team: Janna Ferrier, DO as PCP - General (Family Medicine) Croitoru, Jerel, MD as PCP - Cardiology (Cardiology) Croitoru, Jerel, MD as Consulting Physician (Cardiology) Tobie Franky SQUIBB, DPM as Consulting Physician (Podiatry) Pickenpack-Cousar, Fannie SAILOR, NP as Nurse Practitioner (Hospice and Palliative Medicine) Silvano Valrie SQUIBB, RN as Registered Nurse    INTERVAL HISTORY: Wayne Molina is a 68 y.o. male with oncologic medical history including right parietal glioblastoma with plans to begin 3 week course of IMRT with concurrent Temodar . Palliative is seeing patient for symptom management and goals of care.    SOCIAL HISTORY:     reports that he has quit smoking. His smoking use included cigarettes. He has never used smokeless tobacco. He reports that he does not drink alcohol and does not use drugs.  ADVANCE DIRECTIVES:  On file. His wife [name] is documented clinical research associate.   CODE STATUS: Limited code  PAST MEDICAL HISTORY: Past Medical History:  Diagnosis Date   Anxiety    Arthritis    fingers   Chronic atrial fibrillation (HCC)    Depression    Family history of breast cancer    Family history of cancer of male genital organ    Family history of gene mutation    BRIP1   Family history of malignant neoplasm of gastrointestinal tract    Hyperlipidemia    Hypertension    Personal history of colonic polyps 11/12/2006   Sleep apnea     ALLERGIES:  has no known allergies.  MEDICATIONS:  Current Outpatient Medications  Medication Sig Dispense Refill   acetaminophen  (TYLENOL ) 325 MG tablet Take 1-2 tablets (325-650 mg total) by mouth every 4 (four) hours as needed for mild pain (pain score 1-3).     apixaban  (ELIQUIS ) 5 MG TABS tablet Take 1 tablet (5 mg total) by mouth 2 (two)  times daily. 60 tablet 0   atorvastatin  (LIPITOR) 40 MG tablet Take 1 tablet (40 mg total) by mouth daily. 30 tablet 0   calcium  carbonate (OSCAL) 1500 (600 Ca) MG TABS tablet Take 1 tablet (1,500 mg total) by mouth 2 (two) times daily with a meal. 60 tablet 2   carvedilol  (COREG ) 12.5 MG tablet Take 1 tablet (12.5 mg total) by mouth 2 (two) times daily. 60 tablet 3   Cyanocobalamin (VITAMIN B-12 Molina) Take 1 capsule by mouth daily.     dexamethasone  (DECADRON ) 1 MG tablet Take 3 tablets (3 mg total) by mouth daily with breakfast for 7 days, THEN 2 tablets (2 mg total) daily with breakfast for 7 days, THEN 1 tablet (1 mg total) daily with breakfast for 7 days. 42 tablet 0   furosemide  (LASIX ) 20 MG tablet Take 1 tablet (20 mg total) by mouth daily as needed for up to 14 days. Stop taking if swelling resolves (Patient not taking: Reported on 10/09/2024) 14 tablet 0   gabapentin  (NEURONTIN ) 100 MG capsule Take 1 capsule (100 mg total) by mouth 3 (three) times daily. 90 capsule 2   levETIRAcetam  (KEPPRA ) 1000 MG tablet Take 1 tablet (1,000 mg total) by mouth 2 (two) times daily. 60 tablet 2   LORazepam  (ATIVAN ) 0.5 MG tablet Take 1 tablet 30 minutes prior to radiation procedures or MRIs as needed to help with anxiety. (OK to take 2 tablets before MRIs). 18 tablet 0  Magnesium  Oxide 250 MG TABS Take 1 tablet (250 mg total) by mouth daily in the afternoon. 30 tablet 0   melatonin 3 MG TABS tablet Take 1 tablet (3 mg total) by mouth at bedtime. 30 tablet 2   Multiple Vitamin (MULTIVITAMIN ADULT Molina) Take 1 tablet by mouth daily.     Nystatin (GERHARDT'S BUTT CREAM) CREA Apply to affected area 60 each 0   ondansetron  (ZOFRAN ) 8 MG tablet Take 1 tablet (8 mg total) by mouth every 8 (eight) hours as needed for nausea or vomiting. May take 30-60 minutes prior to Temodar  administration if nausea/vomiting occurs as needed. 30 tablet 1   oxyCODONE  (OXY IR/ROXICODONE ) 5 MG immediate release tablet Take 1 tablet (5 mg  total) by mouth every 6 (six) hours as needed for moderate pain (pain score 4-6). 30 tablet 0   pantoprazole  (PROTONIX ) 40 MG tablet Take 1 tablet (40 mg total) by mouth daily. 90 tablet 0   polyethylene glycol (MIRALAX  / GLYCOLAX ) 17 g packet Take 17 g by mouth 2 (two) times daily.     QUEtiapine  (SEROQUEL ) 25 MG tablet Take 1 tablet (25 mg total) by mouth daily. 90 tablet 0   tamsulosin  (FLOMAX ) 0.4 MG CAPS capsule Take 1 capsule (0.4 mg total) by mouth daily. 90 capsule 0   temozolomide  (TEMODAR ) 140 MG capsule Take 2 capsules (280 mg total) by mouth daily. Take for 5 days on, 23 days off. Repeat every 28 days. May take on an empty stomach to decrease nausea & vomiting. 10 capsule 0   No current facility-administered medications for this visit.    VITAL SIGNS: There were no vitals taken for this visit. There were no vitals filed for this visit.   Estimated body mass index is 25.54 kg/m as calculated from the following:   Height as of an earlier encounter on 10/09/24: 5' 8 (1.727 m).   Weight as of an earlier encounter on 10/09/24: 168 lb (76.2 kg).   PERFORMANCE STATUS (ECOG) : 1 - Symptomatic but completely ambulatory   Physical Exam General: NAD Cardiovascular: regular rate and rhythm Pulmonary: normal breathing pattern Extremities: no joint deformities Skin: no rashes Neurological: AAO x3  IMPRESSION: Discussed the use of AI scribe software for clinical note transcription with the patient, who gave verbal consent to proceed.  History of Present Illness Wayne Molina is a 68 year old male with glioblastoma who presents to clinic for symptom management follow-up. He is accompanied by his wife. No acute distress. Fatigue at times. Denies concerns for nausea, vomiting, constipation, or diarrhea. He is doing well overall. He speaks to plans to potentially begin exercising if he feels up to it. We discussed listening to his body and resting as needed.   We discussed his pain. [name] states his pain is managed and with some improvement however lower extremities with discomfort. He is taking his oxycodone  primarily at night when the pain is most severe. We discussed occasional use during the day for severe pain as needed.  We will continue to closely monitor. No adjustments at this time.   All questions answered and support provided.   Assessment & Plan Glioblastoma Glioblastoma, under ongoing management with chemotherapy. Recent consultation with Dr.Vaslow indicated stable condition. - Continue chemotherapy as prescribed by Dr. [name] - Will schedule follow-up appointment after Christmas.  Neoplasm related pain Pain managed with medication at bedtime. No daytime use reported, but occasional daytime use is permissible if needed. - Continue current pain management regimen  with medication at bedtime. - Allow for occasional daytime use of pain medication if needed. - Continue oxycodone  5mg  every 6 hours as needed for pain management as needed - Contact provider for refill if necessary.  Palliative care management Focus on maintaining quality of life and managing symptoms. No issues with constipation or diarrhea reported. Encouraged to exercise as tolerated without overexertion. - Encouraged exercise as tolerated without overexertion.  I will plan to see patient back in 4-6 weeks. Sooner if needed.    Patient expressed understanding and was in agreement with this plan. He also understands that He can call the clinic at any time with any questions, concerns, or complaints.   Any controlled substances utilized were prescribed in the context of palliative care. PDMP has been reviewed.   Visit consisted of counseling and education dealing with the complex and emotionally intense issues of symptom management and palliative care in the setting of serious and potentially life-threatening illness.  Levon Borer, AGPCNP-BC  Palliative Medicine Team/[facility]

## 2024-10-09 NOTE — Progress Notes (Signed)
 Oral Chemotherapy Pharmacist Encounter  Patient's spouse was counseled under telephone encounter from 10/09/24.  [name], PharmD, BCPS, BCOP Hematology/Oncology Clinical Pharmacist [phone] 10/09/2024 11:52 AM

## 2024-10-09 NOTE — Progress Notes (Signed)
 [facility] [address]  [place], [place] [address] [phone]   Interval Evaluation  Date of Service: 10/09/24 Patient Name: Wayne Molina Patient MRN: [id number] Patient DOB: [birth year] Provider: Arthea MARLA Manns, MD  Identifying Statement:  Wayne Molina is a 68 y.o. male with right parietal glioblastoma   Oncologic History: 07/17/24: Stereotactic biopsy, R parietal with Dr. Rosslyn; path demonstrates glioblastoma IDH-wt 09/02/24: Begins 3 week course of IMRT with concurrent Temodar  75mg /m2 Wayne Molina)  Biomarkers:    Interval History: Wayne Molina presents today for follow up, now 1 month after having completed 3 weeks course of radiation and Temodar .  He is doing well overall, no new or progressive changes.  He still is using a walker to ambulate at home, a wheelchair if going out.  Continues to do well with the decadron , now down to 3mg  daily with taper.  Continues on Keppra  1000mg  twice per day.   H+P (08/10/24) Patient presented to neurologic attention on 07/07/24 with new onset seizures.  CNS imaging demonstrated enhancing masses within right hemisphere and callosum c/w primary brain tumor.  He underwent biopsy on 07/17/24 with Dr. Rosslyn; pathology demonstrated glioblastoma, IDH-wt.  Yesterday he was discharged from acute rehab.  At home he is using a wheelchair or walker to ambulate.  His wife is helping with some of his activities of daily living given his weakness and instability.  He is currently dosing decadron  2mg  twice per day, as well as Keppra  1000mg  twice per day.    Medications: Current Outpatient Medications on File Prior to Visit  Medication Sig Dispense Refill   acetaminophen  (TYLENOL ) 325 MG tablet Take 1-2 tablets (325-650 mg total) by mouth every 4 (four) hours as needed for mild pain (pain score 1-3).     apixaban  (ELIQUIS ) 5 MG TABS tablet Take 1 tablet (5 mg total) by mouth 2 (two) times daily. 60 tablet 0   atorvastatin   (LIPITOR) 40 MG tablet Take 1 tablet (40 mg total) by mouth daily. 30 tablet 0   calcium  carbonate (OSCAL) 1500 (600 Ca) MG TABS tablet Take 1 tablet (1,500 mg total) by mouth 2 (two) times daily with a meal. 60 tablet 2   carvedilol  (COREG ) 12.5 MG tablet Take 1 tablet (12.5 mg total) by mouth 2 (two) times daily. 60 tablet 3   Cyanocobalamin (VITAMIN B-12 PO) Take 1 capsule by mouth daily.     dexamethasone  (DECADRON ) 1 MG tablet Take 3 tablets (3 mg total) by mouth daily with breakfast for 7 days, THEN 2 tablets (2 mg total) daily with breakfast for 7 days, THEN 1 tablet (1 mg total) daily with breakfast for 7 days. 42 tablet 0   furosemide  (LASIX ) 20 MG tablet Take 1 tablet (20 mg total) by mouth daily as needed for up to 14 days. Stop taking if swelling resolves 14 tablet 0   gabapentin  (NEURONTIN ) 100 MG capsule Take 1 capsule (100 mg total) by mouth 3 (three) times daily. 90 capsule 2   levETIRAcetam  (KEPPRA ) 1000 MG tablet Take 1 tablet (1,000 mg total) by mouth 2 (two) times daily. 60 tablet 2   LORazepam  (ATIVAN ) 0.5 MG tablet Take 1 tablet 30 minutes prior to radiation procedures or MRIs as needed to help with anxiety. (OK to take 2 tablets before MRIs). 18 tablet 0   Magnesium  Oxide 250 MG TABS Take 1 tablet (250 mg total) by mouth daily in the afternoon. 30 tablet 0   melatonin  3 MG TABS tablet Take 1 tablet (3 mg total) by mouth at bedtime. 30 tablet 2   Multiple Vitamin (MULTIVITAMIN ADULT PO) Take 1 tablet by mouth daily.     Nystatin (GERHARDT'S BUTT CREAM) CREA Apply to affected area 60 each 0   ondansetron  (ZOFRAN ) 8 MG tablet Take 1 tablet (8 mg total) by mouth every 8 (eight) hours as needed for nausea or vomiting. May take 30-60 minutes prior to Temodar  administration if nausea/vomiting occurs as needed. 30 tablet 1   oxyCODONE  (OXY IR/ROXICODONE ) 5 MG immediate release tablet Take 1 tablet (5 mg total) by mouth every 6 (six) hours as needed for moderate pain (pain score 4-6). 30  tablet 0   pantoprazole  (PROTONIX ) 40 MG tablet Take 1 tablet (40 mg total) by mouth daily. 90 tablet 0   polyethylene glycol (MIRALAX  / GLYCOLAX ) 17 g packet Take 17 g by mouth 2 (two) times daily.     QUEtiapine  (SEROQUEL ) 25 MG tablet Take 1 tablet (25 mg total) by mouth daily. 90 tablet 0   tamsulosin  (FLOMAX ) 0.4 MG CAPS capsule Take 1 capsule (0.4 mg total) by mouth daily. 90 capsule 0   temozolomide  (TEMODAR ) 140 MG capsule Take 1 capsule (140 mg total) by mouth daily. May take on an empty stomach to decrease nausea & vomiting. 21 capsule 0   No current facility-administered medications on file prior to visit.    Allergies: No Known Allergies Past Medical History:  Past Medical History:  Diagnosis Date   Anxiety    Arthritis    fingers   Chronic atrial fibrillation (HCC)    Depression    Family history of breast cancer    Family history of cancer of male genital organ    Family history of gene mutation    BRIP1   Family history of malignant neoplasm of gastrointestinal tract    Hyperlipidemia    Hypertension    Personal history of colonic polyps 11/12/2006   Sleep apnea    Past Surgical History:  Past Surgical History:  Procedure Laterality Date   2-D echocardiogram  07/22/2010   Ejection fraction greater than 55%. Left atrium moderately dilated. Right atrium moderately dilated. 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 Mild to Moderate MR. Mild to moderate TR.   APPLICATION OF CRANIAL NAVIGATION Right 07/17/2024   Procedure: COMPUTER-ASSISTED NAVIGATION, FOR CRANIAL PROCEDURE;  Surgeon: Rosslyn Dino HERO, MD;  Location: [facility] OR;  Service: Neurosurgery;  Laterality: Right;  CRANIAL NAVIGATION   ATRIAL ABLATION SURGERY  11/09/2002   EP IMPLANTABLE DEVICE N/A 02/11/2016   Procedure: Loop Recorder Insertion;  Surgeon: Jerel Balding, MD;  Location: [facility] INVASIVE CV LAB;  Service: Cardiovascular;  Laterality: N/A;   Exercise Myoview stress test  07/08/2000   Nonischemic low-risk.   HIP  FRACTURE SURGERY  1970s   IR GENERIC HISTORICAL  01/21/2017   IR RADIOLOGIST EVAL & MGMT 01/21/2017 [facility]-INTERV RAD   STERIOTACTIC STIMULATOR INSERTION Right 07/17/2024   Procedure: OPEN CRANIOTOMY FOR BIOPSY;  Surgeon: Rosslyn Dino HERO, MD;  Location: [facility] OR;  Service: Neurosurgery;  Laterality: Right;  RIGHT STEREOTACTIC BRAIN BIOPSY   Social History:  Social History   Socioeconomic History   Marital status: Divorced    Spouse name: Not on file   Number of children: 3   Years of education: 12   Highest education level: GED or equivalent  Occupational History   Not on file  Tobacco Use   Smoking status: Former    Current packs/day: 1.00  Types: Cigarettes   Smokeless tobacco: Never   Tobacco comments:    Smoked for approximately 6 months in his life  Vaping Use   Vaping status: Never Used  Substance and Sexual Activity   Alcohol use: No   Drug use: No   Sexual activity: Not Currently  Other Topics Concern   Not on file  Social History Narrative   Patient lives alone in [place] in a one story home. There are no steps.    Patient enjoys going to the gym, watching TV, and going to antique shops.    Patient is divorced with 3 children, two of which have passed; does have a girlfriend.    Patient has his own car and is able to drive.    Social Drivers of Corporate Investment Banker Strain: Low Risk  (03/22/2024)   Overall Financial Resource Strain (CARDIA)    Difficulty of Paying Living Expenses: Not hard at all  Food Insecurity: No Food Insecurity (09/18/2024)   Hunger Vital Sign    Worried About Running Out of Food in the Last Year: Never true    Ran Out of Food in the Last Year: Never true  Transportation Needs: Unmet Transportation Needs (09/18/2024)   PRAPARE - Administrator, Civil Service (Medical): Yes    Lack of Transportation (Non-Medical): Yes  Physical Activity: Sufficiently Active (09/18/2024)   Exercise Vital Sign    Days of Exercise per Week:  5 days    Minutes of Exercise per Session: 60 min  Stress: No Stress Concern Present (09/18/2024)   [employer] of Occupational Health - Occupational Stress Questionnaire    Feeling of Stress: Only a little  Social Connections: Moderately Integrated (09/18/2024)   Social Connection and Isolation Panel    Frequency of Communication with Friends and Family: More than three times a week    Frequency of Social Gatherings with Friends and Family: Three times a week    Attends Religious Services: Never    Active Member of Clubs or Organizations: Yes    Attends Banker Meetings: Never    Marital Status: Married  Catering Manager Violence: Not At Risk (09/18/2024)   Humiliation, Afraid, Rape, and Kick questionnaire    Fear of Current or Ex-Partner: No    Emotionally Abused: No    Physically Abused: No    Sexually Abused: No   Family History:  Family History  Problem Relation Age of Onset   Dementia Mother    Heart disease Father    Diabetes Father    Other Sister        BRIP1 gene mutation   Kidney disease Brother    Colon cancer Maternal Uncle    Lung cancer Paternal Aunt        d. >50   Cancer Paternal Aunt        unknown type, d. >50   Cancer Cousin 17       gynecologic (paternal first cousin)   Colon cancer Nephew 27       arising in colon polyp   Cancer Niece 61       gynecologic; niece in her 40's   Breast cancer Niece 72   Other Niece        BRIP1 gene mutation   Cancer Paternal Great-grandmother        abdominal cancer (PGF's mother) great grandmother    Review of Systems: Constitutional: Doesn't report fevers, chills or abnormal weight loss Eyes: Doesn't report blurriness  of vision Ears, nose, mouth, throat, and face: Doesn't report sore throat Respiratory: Doesn't report cough, dyspnea or wheezes Cardiovascular: Doesn't report palpitation, chest discomfort  Gastrointestinal:  Doesn't report nausea, constipation, diarrhea GU: Doesn't report  incontinence Skin: Doesn't report skin rashes Neurological: Per HPI Musculoskeletal: Doesn't report joint pain Behavioral/Psych: Doesn't report anxiety  Physical Exam: Vitals:   10/09/24 0943  BP: 122/78  Pulse: 67  Resp: 17  Temp: (!) 97.1 F (36.2 C)  SpO2: 100%   KPS: 70. General: Alert, cooperative, pleasant, in no acute distress Head: Normal EENT: No conjunctival injection or scleral icterus.  Lungs: Resp effort normal Cardiac: Regular rate Abdomen: Non-distended abdomen Skin: No rashes cyanosis or petechiae. Extremities: pitting edema b/l LE  Neurologic Exam: Mental Status: Awake, alert, attentive to examiner. Oriented to self and environment. Language is fluent with intact comprehension.  Some psychomotor slowing noted. Cranial Nerves: Visual acuity is grossly normal. Visual fields are full. Extra-ocular movements intact. No ptosis. Face is symmetric Motor: Tone and bulk are normal. Power is 4+/5 in left arm and leg. Reflexes are symmetric, no pathologic reflexes present.  Sensory: Intact to light touch Gait: Hemiparetic   Labs: I have reviewed the data as listed    Component Value Date/Time   NA 138 10/09/2024 0910   NA 141 09/06/2024 1122   K 4.2 10/09/2024 0910   CL 102 10/09/2024 0910   CO2 25 10/09/2024 0910   GLUCOSE 140 (H) 10/09/2024 0910   BUN 25 (H) 10/09/2024 0910   BUN 21 09/06/2024 1122   CREATININE 0.90 10/09/2024 0910   CREATININE 0.97 09/18/2015 0927   CALCIUM  9.6 10/09/2024 0910   PROT 6.7 10/09/2024 0910   PROT 7.1 03/07/2024 1018   ALBUMIN 4.1 10/09/2024 0910   ALBUMIN 4.7 03/07/2024 1018   AST 25 10/09/2024 0910   ALT 37 10/09/2024 0910   ALKPHOS 53 10/09/2024 0910   BILITOT 1.6 (H) 10/09/2024 0910   GFRNONAA >60 10/09/2024 0910   GFRAA 71 03/29/2020 0814   Lab Results  Component Value Date   WBC 11.3 (H) 10/09/2024   NEUTROABS 8.6 (H) 10/09/2024   HGB 13.8 10/09/2024   HCT 41.0 10/09/2024   MCV 93.8 10/09/2024   PLT 198  10/09/2024    Imaging:  [facility] Clinician Interpretation: I have personally reviewed the CNS images as listed.  My interpretation, in the context of the patient's clinical presentation, is treatment effect vs true progression  MR BRAIN W WO CONTRAST Result Date: 10/04/2024 EXAM: MRI BRAIN WITH AND WITHOUT CONTRAST 10/04/2024 02:31:01 PM TECHNIQUE: Multiplanar multisequence MRI of the head/brain was performed with and without the administration of 7 mL of gadobutrol  (GADAVIST ) 1 MMOL/ML intravenous contrast. COMPARISON: MR Head 08/18/2024. CLINICAL HISTORY: Brain/CNS neoplasm, assess treatment response, Glioblastoma, IDH-wildtype. FINDINGS: BRAIN AND VENTRICLES: Evolving post biopsy changes and hematoma are present in the right temporoparietal region and splenium of the corpus callosum. Surrounding thick, irregular enhancement has mildly increased in thickness posteriorly as well as slightly increased in superior extent in the right parietal lobe (series 16 image 103). Confluent nonenhancing T2 hyperintensity in the right parietal white matter has increased. Milder nonenhancing T2 hyperintensity extending to the left of midline across the splenium of the corpus callosum has also increased (series 9 image 30), and there is new mild T2 hyperintensity in the left periatrial white matter (series 9 image 32). Mass effect on the right lateral ventricle has slightly improved. No acute large territory infarct, hydrocephalus, midline shift, or extra-axial fluid collection  is identified. There is mild cerebral atrophy. 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 Scattered small T2 hyperintensities elsewhere in the cerebral white matter bilaterally are unchanged and nonspecific but compatible with mild chronic small vessel ischemic disease. 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 ORBITS: No acute abnormality. SINUSES: No acute abnormality. BONES AND  SOFT TISSUES: Posterior right temporal craniotomy. No acute soft tissue abnormality. IMPRESSION: 1. Known glioblastoma with evolving post-biopsy changes. Mildly increased enhancement in the right parietal region with increased surrounding T2 hyperintensity and increased, mild T2 hyperintensity extending across the corpus callosum into the left periatrial region. Findings could reflect post-treatment changes or tumor. Electronically signed by: Dasie Hamburg MD 10/04/2024 03:58 PM EST RP Workstation: [id number]   CUP PACEART REMOTE DEVICE CHECK Result Date: 09/25/2024 ILR summary report received. Battery status OK. Normal device function. No new symptom, tachy, brady, or pause episodes. No new AF episodes. PVC burden 3.5%.  Monthly summary reports and ROV/PRN ML, CVRS      Assessment/Plan Glioblastoma, IDH-wildtype (HCC)  Wayne Molina is clinically stable today, now having completed 3 weeks of IMRT and concurrent Temodar .  MRI brain demonstrates changes consistent with early tumor progression or pseudo-progression.  We recommended initiating treatment with Temozolomide  150-200 mg/m2, on for five days and off for twenty three days in twenty eight day cycles. The patient will have a complete blood count performed on days 21 and 28 of each cycle, and a comprehensive metabolic panel performed on day 28 of each cycle. Labs may need to be performed more often. Zofran  will prescribed for home use for nausea/vomiting.   Informed consent was obtained verbally at bedside to proceed with oral chemotherapy.  Chemotherapy should be held for the following:  ANC less than 1,000  Platelets less than 100,000  LFT or creatinine greater than 2x ULN  If clinical concerns/contraindications develop  Recommended decreasing decadron  by 1mg  each week, starting with 2mg  daily tomorrow.  Dose may be modified if focal symptoms recur.  We ask that Wayne Molina return to clinic in 1 months prior to cycle #2 with labs  for evaluation, or sooner as needed.  All questions were answered. The patient knows to call the clinic with any problems, questions or concerns. No barriers to learning were detected.  The total time spent in the encounter was 40 minutes and more than 50% was on counseling and review of test results   Arthea MARLA Manns, MD Medical Director of Neuro-Oncology [facility] 10/09/24 9:51 AM

## 2024-10-10 ENCOUNTER — Other Ambulatory Visit: Payer: Self-pay

## 2024-10-10 ENCOUNTER — Other Ambulatory Visit (HOSPITAL_COMMUNITY): Payer: Self-pay

## 2024-10-10 ENCOUNTER — Encounter: Payer: Self-pay | Admitting: Internal Medicine

## 2024-10-10 MED ORDER — PANTOPRAZOLE SODIUM 40 MG PO TBEC
40.0000 mg | DELAYED_RELEASE_TABLET | Freq: Every day | ORAL | 0 refills | Status: DC
  Filled 2024-10-10 (×2): qty 90, 90d supply, fill #0

## 2024-10-10 MED ORDER — TAMSULOSIN HCL 0.4 MG PO CAPS
0.4000 mg | ORAL_CAPSULE | Freq: Every day | ORAL | 0 refills | Status: DC
  Filled 2024-10-10 (×2): qty 90, 90d supply, fill #0

## 2024-10-11 ENCOUNTER — Telehealth: Payer: Self-pay | Admitting: Internal Medicine

## 2024-10-11 ENCOUNTER — Other Ambulatory Visit: Payer: Self-pay

## 2024-10-11 ENCOUNTER — Other Ambulatory Visit (HOSPITAL_COMMUNITY): Payer: Self-pay

## 2024-10-11 NOTE — Telephone Encounter (Signed)
 Scheduled patient for next appointment. Called but could not leave a message, vm box is not set up.

## 2024-10-12 ENCOUNTER — Other Ambulatory Visit: Payer: Self-pay

## 2024-10-12 ENCOUNTER — Other Ambulatory Visit (HOSPITAL_COMMUNITY): Payer: Self-pay

## 2024-10-13 ENCOUNTER — Other Ambulatory Visit: Payer: Self-pay

## 2024-10-13 ENCOUNTER — Encounter: Payer: Self-pay | Admitting: Internal Medicine

## 2024-10-16 ENCOUNTER — Other Ambulatory Visit: Payer: Self-pay

## 2024-10-16 NOTE — Progress Notes (Signed)
 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         Most recent MRI of the brain on 10/04/2024 demonstrates the known glioblastoma to exhibit evolving post-biopsy changes. Mildly increased enhancement in the right parietal region with increased surrounding T2 hyperintensity and increased, mild T2 hyperintensity extending across the corpus callosum, reflective of post-treatment changes versus tumor.   Symptomatically, denies  seizures, headaches, nausea, new neurologic deficits, visual changes.    Patient complains of ***.  Medical oncology's plans for the patient are to initiate Temazolomide for five days and off for twenty three days in twenty eight day cycles.      ALLERGIES:  has no known allergies.  Meds: Current Outpatient Medications  Medication Sig Dispense Refill   acetaminophen  (TYLENOL ) 325 MG tablet Take 1-2 tablets (325-650 mg total) by mouth every 4 (four) hours as needed for mild pain (pain score 1-3).     apixaban  (ELIQUIS ) 5 MG TABS tablet Take 1 tablet (5 mg total) by mouth 2 (two) times daily. 60 tablet 0   atorvastatin  (LIPITOR) 40 MG tablet Take 1  tablet (40 mg total) by mouth daily. 30 tablet 0   calcium  carbonate (OSCAL) 1500 (600 Ca) MG TABS tablet Take 1 tablet (1,500 mg total) by mouth 2 (two) times daily with a meal. 60 tablet 2   carvedilol  (COREG ) 12.5 MG tablet Take 1 tablet (12.5 mg total) by mouth 2 (two) times daily. 60 tablet 3   Cyanocobalamin (VITAMIN B-12 PO) Take 1 capsule by mouth daily.     dexamethasone  (DECADRON ) 1 MG tablet Take 3 tablets (3 mg total) by mouth daily with breakfast for 7 days, THEN 2 tablets (2 mg total) daily with breakfast for 7 days, THEN 1 tablet (1 mg total) daily with breakfast for 7 days. 42 tablet 0   furosemide  (LASIX ) 20 MG tablet Take 1 tablet (20 mg total) by mouth daily as needed for up to 14 days. Stop taking if swelling resolves (Patient not taking: Reported on 10/09/2024) 14 tablet 0   gabapentin  (NEURONTIN ) 100 MG capsule Take 1 capsule (100 mg total) by mouth 3 (three) times daily. 90 capsule 2   levETIRAcetam  (KEPPRA ) 1000 MG tablet Take 1 tablet (1,000 mg total) by mouth 2 (two) times daily. 60 tablet 2   LORazepam  (ATIVAN ) 0.5 MG tablet Take 1 tablet 30 minutes prior to radiation procedures or MRIs as needed to help with anxiety. (OK to take 2 tablets before MRIs). 18 tablet 0   Magnesium  Oxide 250 MG TABS Take 1 tablet (250 mg total) by mouth daily in the afternoon. 30 tablet 0   melatonin 3 MG TABS tablet Take 1 tablet (3 mg total) by mouth at bedtime.  30 tablet 2   Multiple Vitamin (MULTIVITAMIN ADULT PO) Take 1 tablet by mouth daily.     Nystatin (GERHARDT'S BUTT CREAM) CREA Apply to affected area 60 each 0   ondansetron  (ZOFRAN ) 8 MG tablet Take 1 tablet (8 mg total) by mouth every 8 (eight) hours as needed for nausea or vomiting. May take 30-60 minutes prior to Temodar  administration if nausea/vomiting occurs as needed. 30 tablet 1   oxyCODONE  (OXY IR/ROXICODONE ) 5 MG immediate release tablet Take 1 tablet (5 mg total) by mouth every 6 (six) hours as needed for moderate pain (pain  score 4-6). 30 tablet 0   pantoprazole  (PROTONIX ) 40 MG tablet Take 1 tablet (40 mg total) by mouth daily. 90 tablet 0   polyethylene glycol (MIRALAX  / GLYCOLAX ) 17 g packet Take 17 g by mouth 2 (two) times daily.     QUEtiapine  (SEROQUEL ) 25 MG tablet Take 1 tablet (25 mg total) by mouth daily. 90 tablet 0   tamsulosin  (FLOMAX ) 0.4 MG CAPS capsule Take 1 capsule (0.4 mg total) by mouth daily. 90 capsule 0   temozolomide  (TEMODAR ) 140 MG capsule Take 2 capsules (280 mg total) by mouth daily. Take for 5 days on, 23 days off. Repeat every 28 days. May take on an empty stomach to decrease nausea & vomiting. 10 capsule 0   No current facility-administered medications for this visit.    Physical Findings: The patient is in no acute distress. Patient is alert and oriented.  vitals were not taken for this visit. .  No significant changes.  Lab Findings: Lab Results  Component Value Date   WBC 11.3 (H) 10/09/2024   HGB 13.8 10/09/2024   HCT 41.0 10/09/2024   MCV 93.8 10/09/2024   PLT 198 10/09/2024    @LASTCHEMISTRY @  Radiographic Findings: MR BRAIN W WO CONTRAST Result Date: 10/04/2024 EXAM: MRI BRAIN WITH AND WITHOUT CONTRAST 10/04/2024 02:31:01 PM TECHNIQUE: Multiplanar multisequence MRI of the head/brain was performed with and without the administration of 7 mL of gadobutrol  (GADAVIST ) 1 MMOL/ML intravenous contrast. COMPARISON: MR Head 08/18/2024. CLINICAL HISTORY: Brain/CNS neoplasm, assess treatment response, Glioblastoma, IDH-wildtype. FINDINGS: BRAIN AND VENTRICLES: Evolving post biopsy changes and hematoma are present in the right temporoparietal region and splenium of the corpus callosum. Surrounding thick, irregular enhancement has mildly increased in thickness posteriorly as well as slightly increased in superior extent in the right parietal lobe (series 16 image 103). Confluent nonenhancing T2 hyperintensity in the right parietal white matter has increased. Milder nonenhancing T2  hyperintensity extending to the left of midline across the splenium of the corpus callosum has also increased (series 9 image 30), and there is new mild T2 hyperintensity in the left periatrial white matter (series 9 image 32). Mass effect on the right lateral ventricle has slightly improved. No acute large territory infarct, hydrocephalus, midline shift, or extra-axial fluid collection is identified. There is mild cerebral atrophy. 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 Scattered small T2 hyperintensities elsewhere in the cerebral white matter bilaterally are unchanged and nonspecific but compatible with mild chronic small vessel ischemic disease. 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 ORBITS: No acute abnormality. SINUSES: No acute abnormality. BONES AND SOFT TISSUES: Posterior right temporal craniotomy. No acute soft tissue abnormality. IMPRESSION: 1. Known glioblastoma with evolving post-biopsy changes. Mildly increased enhancement in the right parietal region with increased surrounding T2 hyperintensity and increased, mild T2 hyperintensity extending across the corpus callosum into the left periatrial region. Findings could  reflect post-treatment changes or tumor. Electronically signed by: Dasie Hamburg MD 10/04/2024 03:58 PM EST RP Workstation: [id number]   CUP PACEART REMOTE DEVICE CHECK Result Date: 09/25/2024 ILR summary report received. Battery status OK. Normal device function. No new symptom, tachy, brady, or pause episodes. No new AF episodes. PVC burden 3.5%.  Monthly summary reports and ROV/PRN ML, CVRS   Impression/Plan:  68 yo gentleman with right parietal glioblastoma; s/p concurrent IMRT and Temodar  completed on 09/12/2024   Patient is doing well after the completion of his radiation treatment ***. MRI of the brain demonstrates changes of early tumor progression verus pseudo-progression. He has continued with  Temozolomide  therapy under the care of Dr. [name]. He is scheduled to see him next on 11/06/2024.   Radiation follow up PRN. We appreciate the opportunity to take part in this patient's care. He was encouraged to call with any questions or concerns in the meantime.    On date of service, in total, I spent *** minutes on this encounter. Patient was seen in person.  _____________________________________    Leeroy Due, PA-C

## 2024-10-17 ENCOUNTER — Telehealth: Payer: Self-pay | Admitting: *Deleted

## 2024-10-17 ENCOUNTER — Encounter: Payer: Self-pay | Admitting: Internal Medicine

## 2024-10-17 ENCOUNTER — Encounter: Payer: Self-pay | Admitting: Radiology

## 2024-10-17 ENCOUNTER — Ambulatory Visit: Admission: RE | Admit: 2024-10-17 | Discharge: 2024-10-17 | Attending: Radiology | Admitting: Radiology

## 2024-10-17 DIAGNOSIS — C719 Malignant neoplasm of brain, unspecified: Secondary | ICD-10-CM

## 2024-10-17 HISTORY — DX: Personal history of irradiation: Z92.3

## 2024-10-17 NOTE — Telephone Encounter (Signed)
 Completed progress report for [employer] STD paperwork   Sent to provider to review, amend, sign and return to this nurse to return to claims benefit manager.  Sent ROI through DocuSign to [email or web address].  Called and connected with patient and spouse to advise to sign ROI.  He verbally agreed.  Advised a signed is required to file in records and for SW HIM.  Spouse has to assist. Verbal instructions provided to complete ROI.

## 2024-10-17 NOTE — Progress Notes (Signed)
 Wayne Molina presents via phone today to speak with Leeroy Due PA-C for follow up post radiation to the brain.    They completed their radiation on: 09-12-2024  Does the patient complain of any of the following: Headache: Denies Visual Changes: Denies Hearing Changes: denies Nausea: Denies Vomiting: Denies Balance or coordination issues: Denies Memory issues: Denies  Is the patient currently on a Decadron  regimen? : Yes **Take 3 tablets (3 mg total) by mouth daily with breakfast for 7 days, THEN 2 tablets (2 mg total) daily with breakfast for 7 days, THEN 1 tablet (1 mg total) daily with breakfast for 7 days.   Additional comments if applicable:None at this time.  No vitals were taken for this visit.

## 2024-10-18 ENCOUNTER — Other Ambulatory Visit: Payer: Self-pay

## 2024-10-23 ENCOUNTER — Other Ambulatory Visit: Payer: Self-pay

## 2024-10-26 ENCOUNTER — Other Ambulatory Visit: Payer: Self-pay

## 2024-10-26 ENCOUNTER — Encounter

## 2024-10-26 DIAGNOSIS — R55 Syncope and collapse: Secondary | ICD-10-CM | POA: Diagnosis not present

## 2024-10-26 LAB — CUP PACEART REMOTE DEVICE CHECK
Date Time Interrogation Session: 20251217233049
Implantable Pulse Generator Implant Date: 20240111

## 2024-10-27 NOTE — Progress Notes (Signed)
 Remote Loop Recorder Transmission

## 2024-10-30 ENCOUNTER — Encounter: Payer: Self-pay | Admitting: Internal Medicine

## 2024-10-30 ENCOUNTER — Other Ambulatory Visit (HOSPITAL_COMMUNITY): Payer: Self-pay

## 2024-10-31 ENCOUNTER — Encounter: Payer: Self-pay | Admitting: Internal Medicine

## 2024-10-31 ENCOUNTER — Other Ambulatory Visit (HOSPITAL_COMMUNITY): Payer: Self-pay

## 2024-10-31 ENCOUNTER — Other Ambulatory Visit: Payer: Self-pay

## 2024-10-31 MED ORDER — MAGNESIUM OXIDE -MG SUPPLEMENT 250 MG PO TABS
250.0000 mg | ORAL_TABLET | Freq: Every day | ORAL | 0 refills | Status: DC
  Filled 2024-10-31 – 2024-11-08 (×2): qty 30, 30d supply, fill #0

## 2024-11-01 ENCOUNTER — Encounter (HOSPITAL_COMMUNITY): Payer: Self-pay

## 2024-11-01 ENCOUNTER — Other Ambulatory Visit (HOSPITAL_COMMUNITY): Payer: Self-pay

## 2024-11-03 ENCOUNTER — Emergency Department (HOSPITAL_COMMUNITY)

## 2024-11-03 ENCOUNTER — Other Ambulatory Visit: Payer: Self-pay

## 2024-11-03 ENCOUNTER — Encounter: Payer: Self-pay | Admitting: Internal Medicine

## 2024-11-03 ENCOUNTER — Inpatient Hospital Stay (HOSPITAL_COMMUNITY)

## 2024-11-03 ENCOUNTER — Telehealth: Payer: Self-pay | Admitting: *Deleted

## 2024-11-03 ENCOUNTER — Inpatient Hospital Stay (HOSPITAL_COMMUNITY)
Admission: EM | Admit: 2024-11-03 | Discharge: 2024-11-10 | DRG: 054 | Disposition: A | Discharge status: Rehabilitation Facility | Attending: Family Medicine | Admitting: Family Medicine

## 2024-11-03 ENCOUNTER — Encounter (HOSPITAL_COMMUNITY): Payer: Self-pay

## 2024-11-03 DIAGNOSIS — K5900 Constipation, unspecified: Secondary | ICD-10-CM | POA: Diagnosis not present

## 2024-11-03 DIAGNOSIS — I482 Chronic atrial fibrillation, unspecified: Secondary | ICD-10-CM | POA: Diagnosis present

## 2024-11-03 DIAGNOSIS — G936 Cerebral edema: Secondary | ICD-10-CM | POA: Diagnosis present

## 2024-11-03 DIAGNOSIS — E785 Hyperlipidemia, unspecified: Secondary | ICD-10-CM | POA: Diagnosis present

## 2024-11-03 DIAGNOSIS — Z833 Family history of diabetes mellitus: Secondary | ICD-10-CM

## 2024-11-03 DIAGNOSIS — F32A Depression, unspecified: Secondary | ICD-10-CM | POA: Diagnosis present

## 2024-11-03 DIAGNOSIS — Z66 Do not resuscitate: Secondary | ICD-10-CM | POA: Diagnosis not present

## 2024-11-03 DIAGNOSIS — D72829 Elevated white blood cell count, unspecified: Secondary | ICD-10-CM | POA: Diagnosis not present

## 2024-11-03 DIAGNOSIS — Z7901 Long term (current) use of anticoagulants: Secondary | ICD-10-CM

## 2024-11-03 DIAGNOSIS — I2489 Other forms of acute ischemic heart disease: Secondary | ICD-10-CM | POA: Diagnosis present

## 2024-11-03 DIAGNOSIS — K219 Gastro-esophageal reflux disease without esophagitis: Secondary | ICD-10-CM | POA: Diagnosis present

## 2024-11-03 DIAGNOSIS — Z515 Encounter for palliative care: Secondary | ICD-10-CM | POA: Diagnosis not present

## 2024-11-03 DIAGNOSIS — G8194 Hemiplegia, unspecified affecting left nondominant side: Secondary | ICD-10-CM | POA: Diagnosis present

## 2024-11-03 DIAGNOSIS — G4733 Obstructive sleep apnea (adult) (pediatric): Secondary | ICD-10-CM | POA: Diagnosis present

## 2024-11-03 DIAGNOSIS — Z9221 Personal history of antineoplastic chemotherapy: Secondary | ICD-10-CM | POA: Diagnosis not present

## 2024-11-03 DIAGNOSIS — R194 Change in bowel habit: Secondary | ICD-10-CM | POA: Insufficient documentation

## 2024-11-03 DIAGNOSIS — F17210 Nicotine dependence, cigarettes, uncomplicated: Secondary | ICD-10-CM | POA: Diagnosis present

## 2024-11-03 DIAGNOSIS — W19XXXD Unspecified fall, subsequent encounter: Secondary | ICD-10-CM | POA: Diagnosis not present

## 2024-11-03 DIAGNOSIS — Z79899 Other long term (current) drug therapy: Secondary | ICD-10-CM | POA: Diagnosis not present

## 2024-11-03 DIAGNOSIS — L304 Erythema intertrigo: Secondary | ICD-10-CM | POA: Diagnosis present

## 2024-11-03 DIAGNOSIS — R569 Unspecified convulsions: Secondary | ICD-10-CM | POA: Diagnosis not present

## 2024-11-03 DIAGNOSIS — M6281 Muscle weakness (generalized): Secondary | ICD-10-CM | POA: Diagnosis not present

## 2024-11-03 DIAGNOSIS — I4891 Unspecified atrial fibrillation: Secondary | ICD-10-CM | POA: Diagnosis not present

## 2024-11-03 DIAGNOSIS — W19XXXA Unspecified fall, initial encounter: Secondary | ICD-10-CM | POA: Diagnosis present

## 2024-11-03 DIAGNOSIS — C719 Malignant neoplasm of brain, unspecified: Principal | ICD-10-CM | POA: Diagnosis present

## 2024-11-03 DIAGNOSIS — R319 Hematuria, unspecified: Secondary | ICD-10-CM | POA: Diagnosis not present

## 2024-11-03 DIAGNOSIS — Z923 Personal history of irradiation: Secondary | ICD-10-CM | POA: Diagnosis not present

## 2024-11-03 DIAGNOSIS — Z8249 Family history of ischemic heart disease and other diseases of the circulatory system: Secondary | ICD-10-CM

## 2024-11-03 DIAGNOSIS — I10 Essential (primary) hypertension: Secondary | ICD-10-CM | POA: Diagnosis present

## 2024-11-03 DIAGNOSIS — R059 Cough, unspecified: Secondary | ICD-10-CM | POA: Diagnosis not present

## 2024-11-03 DIAGNOSIS — I160 Hypertensive urgency: Secondary | ICD-10-CM | POA: Diagnosis present

## 2024-11-03 DIAGNOSIS — N401 Enlarged prostate with lower urinary tract symptoms: Secondary | ICD-10-CM | POA: Diagnosis not present

## 2024-11-03 DIAGNOSIS — N400 Enlarged prostate without lower urinary tract symptoms: Secondary | ICD-10-CM | POA: Diagnosis not present

## 2024-11-03 DIAGNOSIS — R531 Weakness: Secondary | ICD-10-CM | POA: Diagnosis present

## 2024-11-03 DIAGNOSIS — R300 Dysuria: Secondary | ICD-10-CM | POA: Insufficient documentation

## 2024-11-03 DIAGNOSIS — R17 Unspecified jaundice: Secondary | ICD-10-CM | POA: Diagnosis present

## 2024-11-03 DIAGNOSIS — Z789 Other specified health status: Secondary | ICD-10-CM

## 2024-11-03 DIAGNOSIS — R350 Frequency of micturition: Secondary | ICD-10-CM | POA: Diagnosis not present

## 2024-11-03 LAB — CBC WITH DIFFERENTIAL/PLATELET
Abs Immature Granulocytes: 0.03 K/uL (ref 0.00–0.07)
Basophils Absolute: 0 K/uL (ref 0.0–0.1)
Basophils Relative: 0 %
Eosinophils Absolute: 0.1 K/uL (ref 0.0–0.5)
Eosinophils Relative: 2 %
HCT: 40.7 % (ref 39.0–52.0)
Hemoglobin: 13.4 g/dL (ref 13.0–17.0)
Immature Granulocytes: 1 %
Lymphocytes Relative: 22 %
Lymphs Abs: 1.4 K/uL (ref 0.7–4.0)
MCH: 32.4 pg (ref 26.0–34.0)
MCHC: 32.9 g/dL (ref 30.0–36.0)
MCV: 98.5 fL (ref 80.0–100.0)
Monocytes Absolute: 0.6 K/uL (ref 0.1–1.0)
Monocytes Relative: 10 %
Neutro Abs: 4.3 K/uL (ref 1.7–7.7)
Neutrophils Relative %: 65 %
Platelets: 220 K/uL (ref 150–400)
RBC: 4.13 MIL/uL — ABNORMAL LOW (ref 4.22–5.81)
RDW: 14.3 % (ref 11.5–15.5)
WBC: 6.5 K/uL (ref 4.0–10.5)
nRBC: 0 % (ref 0.0–0.2)

## 2024-11-03 LAB — URINALYSIS, ROUTINE W REFLEX MICROSCOPIC
Bacteria, UA: NONE SEEN
Bilirubin Urine: NEGATIVE
Glucose, UA: NEGATIVE mg/dL
Hgb urine dipstick: NEGATIVE
Ketones, ur: NEGATIVE mg/dL
Leukocytes,Ua: NEGATIVE
Nitrite: NEGATIVE
Protein, ur: 30 mg/dL — AB
Specific Gravity, Urine: 1.018 (ref 1.005–1.030)
pH: 5 (ref 5.0–8.0)

## 2024-11-03 LAB — COMPREHENSIVE METABOLIC PANEL WITH GFR
ALT: 25 U/L (ref 0–44)
AST: 32 U/L (ref 15–41)
Albumin: 3.4 g/dL — ABNORMAL LOW (ref 3.5–5.0)
Alkaline Phosphatase: 44 U/L (ref 38–126)
Anion gap: 11 (ref 5–15)
BUN: 18 mg/dL (ref 8–23)
CO2: 22 mmol/L (ref 22–32)
Calcium: 8.6 mg/dL — ABNORMAL LOW (ref 8.9–10.3)
Chloride: 106 mmol/L (ref 98–111)
Creatinine, Ser: 0.77 mg/dL (ref 0.61–1.24)
GFR, Estimated: >60 mL/min
Glucose, Bld: 94 mg/dL (ref 70–99)
Potassium: 4.3 mmol/L (ref 3.5–5.1)
Sodium: 139 mmol/L (ref 135–145)
Total Bilirubin: 1.8 mg/dL — ABNORMAL HIGH (ref 0.0–1.2)
Total Protein: 5.9 g/dL — ABNORMAL LOW (ref 6.5–8.1)

## 2024-11-03 LAB — LIPASE, BLOOD: Lipase: 24 U/L (ref 11–51)

## 2024-11-03 LAB — TROPONIN T, HIGH SENSITIVITY
Troponin T High Sensitivity: 60 ng/L — ABNORMAL HIGH (ref 0–19)
Troponin T High Sensitivity: 47 ng/L — ABNORMAL HIGH (ref 0–19)

## 2024-11-03 MED ORDER — LACTATED RINGERS IV BOLUS
1000.0000 mL | Freq: Once | INTRAVENOUS | Status: AC
  Administered 2024-11-03: 1000 mL via INTRAVENOUS

## 2024-11-03 MED ORDER — ACETAMINOPHEN 325 MG PO TABS
325.0000 mg | ORAL_TABLET | ORAL | Status: DC | PRN
  Filled 2024-11-03: qty 2

## 2024-11-03 MED ORDER — OXYCODONE HCL 5 MG PO TABS
5.0000 mg | ORAL_TABLET | Freq: Four times a day (QID) | ORAL | Status: DC | PRN
  Administered 2024-11-04 – 2024-11-09 (×8): 5 mg via ORAL
  Filled 2024-11-03 (×2): qty 1

## 2024-11-03 MED ORDER — DEXAMETHASONE SOD PHOSPHATE PF 10 MG/ML IJ SOLN
10.0000 mg | Freq: Once | INTRAMUSCULAR | Status: AC
  Administered 2024-11-03: 10 mg via INTRAVENOUS

## 2024-11-03 MED ORDER — APIXABAN 5 MG PO TABS
5.0000 mg | ORAL_TABLET | Freq: Two times a day (BID) | ORAL | Status: DC
  Administered 2024-11-03 – 2024-11-10 (×14): 5 mg via ORAL
  Filled 2024-11-03 (×5): qty 1

## 2024-11-03 MED ORDER — CALCIUM CARBONATE 1250 (500 CA) MG PO TABS
1250.0000 mg | ORAL_TABLET | Freq: Two times a day (BID) | ORAL | Status: DC
  Administered 2024-11-03 – 2024-11-10 (×14): 1250 mg via ORAL
  Filled 2024-11-03 (×5): qty 1

## 2024-11-03 MED ORDER — LORAZEPAM 2 MG/ML IJ SOLN
1.0000 mg | Freq: Once | INTRAMUSCULAR | Status: AC
  Administered 2024-11-03: 1 mg via INTRAVENOUS
  Filled 2024-11-03: qty 1

## 2024-11-03 MED ORDER — CARVEDILOL 12.5 MG PO TABS
12.5000 mg | ORAL_TABLET | Freq: Two times a day (BID) | ORAL | Status: DC
  Administered 2024-11-03 – 2024-11-10 (×14): 12.5 mg via ORAL
  Filled 2024-11-03 (×5): qty 1

## 2024-11-03 MED ORDER — HYDRALAZINE HCL 20 MG/ML IJ SOLN
10.0000 mg | Freq: Once | INTRAMUSCULAR | Status: AC
  Administered 2024-11-03: 10 mg via INTRAVENOUS
  Filled 2024-11-03: qty 1

## 2024-11-03 MED ORDER — LORAZEPAM 0.5 MG PO TABS
0.5000 mg | ORAL_TABLET | Freq: Once | ORAL | Status: AC | PRN
  Administered 2024-11-03: 0.5 mg via ORAL
  Filled 2024-11-03: qty 1

## 2024-11-03 MED ORDER — GADOBUTROL 1 MMOL/ML IV SOLN
7.0000 mL | Freq: Once | INTRAVENOUS | Status: AC | PRN
  Administered 2024-11-03: 7 mL via INTRAVENOUS

## 2024-11-03 MED ORDER — MELATONIN 3 MG PO TABS
3.0000 mg | ORAL_TABLET | Freq: Every day | ORAL | Status: DC
  Administered 2024-11-03 – 2024-11-09 (×7): 3 mg via ORAL
  Filled 2024-11-03 (×3): qty 1

## 2024-11-03 MED ORDER — DEXAMETHASONE SODIUM PHOSPHATE 4 MG/ML IJ SOLN
4.0000 mg | Freq: Four times a day (QID) | INTRAMUSCULAR | Status: DC
  Administered 2024-11-03 – 2024-11-07 (×15): 4 mg via INTRAVENOUS
  Filled 2024-11-03 (×10): qty 1

## 2024-11-03 MED ORDER — LEVETIRACETAM 500 MG PO TABS
1000.0000 mg | ORAL_TABLET | Freq: Two times a day (BID) | ORAL | Status: DC
  Administered 2024-11-04 – 2024-11-10 (×13): 1000 mg via ORAL
  Filled 2024-11-03 (×4): qty 2

## 2024-11-03 MED ORDER — POLYETHYLENE GLYCOL 3350 17 G PO PACK
17.0000 g | PACK | Freq: Every day | ORAL | Status: DC
  Administered 2024-11-04 – 2024-11-09 (×3): 17 g via ORAL
  Filled 2024-11-03 (×3): qty 1

## 2024-11-03 MED ORDER — SODIUM CHLORIDE 0.9% FLUSH
3.0000 mL | Freq: Two times a day (BID) | INTRAVENOUS | Status: DC
  Administered 2024-11-03 – 2024-11-10 (×14): 3 mL via INTRAVENOUS

## 2024-11-03 MED ORDER — LEVETIRACETAM 500 MG PO TABS
1000.0000 mg | ORAL_TABLET | Freq: Once | ORAL | Status: AC
  Administered 2024-11-03: 1000 mg via ORAL
  Filled 2024-11-03: qty 2

## 2024-11-03 MED ORDER — CARVEDILOL 12.5 MG PO TABS: 12.5000 mg | ORAL_TABLET | Freq: Two times a day (BID) | ORAL | Status: DC

## 2024-11-03 MED ORDER — PANTOPRAZOLE SODIUM 40 MG PO TBEC
40.0000 mg | DELAYED_RELEASE_TABLET | Freq: Every day | ORAL | Status: DC
  Administered 2024-11-03 – 2024-11-10 (×8): 40 mg via ORAL
  Filled 2024-11-03 (×4): qty 1

## 2024-11-03 MED ORDER — QUETIAPINE FUMARATE 25 MG PO TABS
25.0000 mg | ORAL_TABLET | Freq: Every day | ORAL | Status: DC
  Filled 2024-11-03: qty 1

## 2024-11-03 MED ORDER — ATORVASTATIN CALCIUM 40 MG PO TABS
40.0000 mg | ORAL_TABLET | Freq: Every day | ORAL | Status: DC
  Administered 2024-11-03 – 2024-11-10 (×8): 40 mg via ORAL
  Filled 2024-11-03 (×3): qty 1

## 2024-11-03 MED ORDER — QUETIAPINE FUMARATE 25 MG PO TABS
25.0000 mg | ORAL_TABLET | Freq: Every day | ORAL | Status: DC
  Administered 2024-11-03 – 2024-11-09 (×7): 25 mg via ORAL
  Filled 2024-11-03 (×2): qty 1

## 2024-11-03 NOTE — ED Provider Notes (Signed)
 " [place] EMERGENCY DEPARTMENT AT [facility] Provider Note   CSN: [id number] Arrival date & time: 11/03/24  1150     Patient presents with: Weakness   Wayne Molina is a 68 y.o. male.   This is a 67 year old male presenting emergency department with generalized weakness for the past week.  Decreased p.o. intake.  Does have brain cancer, currently receiving therapy.  Was on steroids, but stopped them a week ago.  Unclear reasons.  Patient notes progressive weakness.  Fell earlier today, does not feel that he can stand he feels so weak.  He denies headache, vision loss, pain from the fall, no chest pain or shortness of breath no abdominal pain nausea vomiting.   Weakness      Prior to Admission medications  Medication Sig Start Date End Date Taking? Authorizing Provider  acetaminophen  (TYLENOL ) 325 MG tablet Take 1-2 tablets (325-650 mg total) by mouth every 4 (four) hours as needed for mild pain (pain score 1-3). 08/08/24  Yes Angiulli, Toribio PARAS, PA-C  apixaban  (ELIQUIS ) 5 MG TABS tablet Take 1 tablet (5 mg total) by mouth 2 (two) times daily. 08/08/24  Yes Angiulli, Toribio PARAS, PA-C  atorvastatin  (LIPITOR) 40 MG tablet Take 1 tablet (40 mg total) by mouth daily. 08/08/24  Yes Angiulli, Toribio PARAS, PA-C  calcium  carbonate (OSCAL) 1500 (600 Ca) MG TABS tablet Take 1 tablet (1,500 mg total) by mouth 2 (two) times daily with a meal. 09/08/24  Yes Gomes, Adriana, DO  carvedilol  (COREG ) 12.5 MG tablet Take 1 tablet (12.5 mg total) by mouth 2 (two) times daily. 09/08/24  Yes Janna Ferrier, DO  Cyanocobalamin (VITAMIN B-12 PO) Take 1 capsule by mouth daily.   Yes [provider]  gabapentin  (NEURONTIN ) 100 MG capsule Take 1 capsule (100 mg total) by mouth 3 (three) times daily. Patient taking differently: Take 100 mg by mouth 3 (three) times daily as needed. 09/08/24  Yes Janna Ferrier, DO  levETIRAcetam  (KEPPRA ) 1000 MG tablet Take 1 tablet (1,000 mg total) by mouth 2 (two)  times daily. 10/03/24  Yes Vaslow, Zachary K, MD  Magnesium  Oxide -Mg Supplement 250 MG TABS Take 1 tablet (250 mg total) by mouth daily in the afternoon. 08/08/24  Yes Angiulli, Toribio PARAS, PA-C  Magnesium  Oxide 250 MG TABS Take 1 tablet (250 mg total) by mouth daily in the afternoon. 08/08/24  Yes Angiulli, Toribio PARAS, PA-C  melatonin 3 MG TABS tablet Take 1 tablet (3 mg total) by mouth at bedtime. 09/08/24  Yes Gomes, Adriana, DO  Multiple Vitamin (MULTIVITAMIN ADULT PO) Take 1 tablet by mouth daily. 02/07/18  Yes [provider]  ondansetron  (ZOFRAN ) 8 MG tablet Take 1 tablet (8 mg total) by mouth every 8 (eight) hours as needed for nausea or vomiting. May take 30-60 minutes prior to Temodar  administration if nausea/vomiting occurs as needed. 10/09/24  Yes Vaslow, Zachary K, MD  oxyCODONE  (OXY IR/ROXICODONE ) 5 MG immediate release tablet Take 1 tablet (5 mg total) by mouth every 6 (six) hours as needed for moderate pain (pain score 4-6). 10/09/24  Yes Pickenpack-Cousar, Fannie SAILOR, NP  pantoprazole  (PROTONIX ) 40 MG tablet Take 1 tablet (40 mg total) by mouth daily. 10/10/24  Yes Manon Jester, DO  polyethylene glycol (MIRALAX  / GLYCOLAX ) 17 g packet Take 17 g by mouth daily.   Yes [provider]  QUEtiapine  (SEROQUEL ) 25 MG tablet Take 1 tablet (25 mg total) by mouth daily. 10/09/24  Yes Janna Ferrier, DO  tamsulosin  (  FLOMAX ) 0.4 MG CAPS capsule Take 1 capsule (0.4 mg total) by mouth daily. Patient taking differently: Take 0.4 mg by mouth daily as needed. 10/10/24  Yes Manon Jester, DO  temozolomide  (TEMODAR ) 140 MG capsule Take 2 capsules (280 mg total) by mouth daily. Take for 5 days on, 23 days off. Repeat every 28 days. May take on an empty stomach to decrease nausea & vomiting. 10/09/24  Yes Vaslow, Zachary K, MD  furosemide  (LASIX ) 20 MG tablet Take 1 tablet (20 mg total) by mouth daily as needed for up to 14 days. Stop taking if swelling resolves Patient not taking: Reported on  10/17/2024 09/04/24 09/18/24  Walisiewicz, Kaitlyn E, PA-C  LORazepam  (ATIVAN ) 0.5 MG tablet Take 1 tablet 30 minutes prior to radiation procedures or MRIs as needed to help with anxiety. (OK to take 2 tablets before MRIs). Patient not taking: Reported on 11/03/2024 08/31/24   Izell Domino, MD  Nystatin (GERHARDT'S BUTT CREAM) CREA Apply to affected area Patient not taking: Reported on 11/03/2024 08/08/24   Pegge Toribio PARAS, PA-C    Allergies: Patient has no known allergies.    Review of Systems  Neurological:  Positive for weakness.    Updated Vital Signs BP (!) 153/96   Pulse 92   Temp 99.3 F (37.4 C) (Oral)   Resp 20   Ht 5' 8 (1.727 m)   Wt 72.6 kg   SpO2 98%   BMI 24.33 kg/m   Physical Exam Vitals and nursing note reviewed.  Constitutional:      General: He is not in acute distress.    Appearance: He is not toxic-appearing.  HENT:     Head: Normocephalic and atraumatic.     Nose: Nose normal.     Mouth/Throat:     Mouth: Mucous membranes are moist.  Eyes:     Conjunctiva/sclera: Conjunctivae normal.  Cardiovascular:     Rate and Rhythm: Normal rate and regular rhythm.  Pulmonary:     Effort: Pulmonary effort is normal.     Breath sounds: Normal breath sounds.  Abdominal:     General: Abdomen is flat. There is no distension.     Tenderness: There is no abdominal tenderness. There is no guarding or rebound.  Musculoskeletal:        General: Normal range of motion.  Skin:    General: Skin is warm and dry.     Capillary Refill: Capillary refill takes less than 2 seconds.  Neurological:     General: No focal deficit present.     Mental Status: He is alert and oriented to person, place, and time. Mental status is at baseline.  Psychiatric:        Mood and Affect: Mood normal.        Behavior: Behavior normal.     (all labs ordered are listed, but only abnormal results are displayed) Labs Reviewed  CBC WITH DIFFERENTIAL/PLATELET - Abnormal; Notable for  the following components:      Result Value   RBC 4.13 (*)    All other components within normal limits  URINALYSIS, ROUTINE W REFLEX MICROSCOPIC - Abnormal; Notable for the following components:   Protein, ur 30 (*)    All other components within normal limits  COMPREHENSIVE METABOLIC PANEL WITH GFR - Abnormal; Notable for the following components:   Calcium  8.6 (*)    Total Protein 5.9 (*)    Albumin 3.4 (*)    Total Bilirubin 1.8 (*)    All other components  within normal limits  TROPONIN T, HIGH SENSITIVITY - Abnormal; Notable for the following components:   Troponin T High Sensitivity 60 (*)    All other components within normal limits  LIPASE, BLOOD    EKG: None  Radiology: DG Chest 2 View Result Date: 11/03/2024 CLINICAL DATA:  Weakness EXAM: DG CHEST 2V COMPARISON:  July 21, 2024 FINDINGS: Stable cardiomegaly. Both lungs are clear. The visualized skeletal structures are unremarkable. IMPRESSION: No active cardiopulmonary disease. Electronically Signed   By: Lynwood Landy Raddle M.D.   On: 11/03/2024 13:21   CT Head Wo Contrast Result Date: 11/03/2024 EXAM: CT HEAD WITHOUT CONTRAST _study_datetime_ TECHNIQUE: CT of the head was performed without the administration of intravenous contrast. Automated exposure control, iterative reconstruction, and/or weight based adjustment of the mA/kV was utilized to reduce the radiation dose to as low as reasonably achievable. COMPARISON: MRI 10/04/2024 and CT head 07/26/2024. CLINICAL HISTORY: FINDINGS: BRAIN AND VENTRICLES: Redemonstrated postsurgical changes of right parietal craniotomy for biopsy of known mass lesion. Similar appearance of postprocedure changes along the biopsy tract. There is no evidence of acute intracranial hemorrhage. Right parietal mass is overall slightly increased in size with slight increased involvement of the right splenium of the corpus callosum. There are increased areas of vasogenic edema within the right parietal  lobe extending into the posterior right temporal lobe. There is similar effacement of the atrium and occipital horn of the right lateral ventricle. No significant midline shift. Atherosclerosis at the skull base. Remote infarcts in the right cerebellum. No evidence of acute infarct. No hydrocephalus. No extra-axial collection. ORBITS: No acute abnormality. SINUSES: No acute abnormality. SOFT TISSUES AND SKULL: Soft tissue swelling in the left facial soft tissues concerning for possible contusion. No skull fracture. IMPRESSION: 1. Slightly increased size of the right parietal mass with increased involvement of the right splenium of the corpus callosum. Increased associated vasogenic edema in the right parietal lobe. Recommend MRI brain with and without contrast for further evaluation. 2. Soft tissue swelling in the left facial soft tissues, concerning for possible contusion. 3. Similar evolving post-biopsy changes in the right temporoparietal lobes. Electronically signed by: Donnice Mania MD 11/03/2024 12:48 PM EST RP Workstation: [id number]     Procedures   Medications Ordered in the ED  levETIRAcetam  (KEPPRA ) tablet 1,000 mg (has no administration in time range)  hydrALAZINE  (APRESOLINE ) injection 10 mg (has no administration in time range)  lactated ringers  bolus 1,000 mL (0 mLs Intravenous Stopped 11/03/24 1509)  dexamethasone  (DECADRON ) injection 10 mg (10 mg Intravenous Given 11/03/24 1405)    Clinical Course as of 11/03/24 1634  Fri Nov 03, 2024  1156 Oncology note early this month: Wayne Molina is clinically stable today, now having completed 3 weeks of IMRT and concurrent Temodar .  MRI brain demonstrates changes consistent with early tumor progression or pseudo-progression.   We recommended initiating treatment with Temozolomide  150-200 mg/m2, on for five days and off for twenty three days in twenty eight day cycles. The patient will have a complete blood count performed on days 21 and 28 of  each cycle, and a comprehensive metabolic panel performed on day 28 of each cycle. Labs may need to be performed more often. Zofran  will prescribed for home use for nausea/vomiting.    Informed consent was obtained verbally at bedside to proceed with oral chemotherapy.  Chemotherapy should be held for the following:  ANC less than 1,000  Platelets less than 100,000  LFT or creatinine greater than 2x ULN  If clinical concerns/contraindications develop  [TY]  1311 CT Head Wo Contrast IMPRESSION: 1. Slightly increased size of the right parietal mass with increased involvement of the right splenium of the corpus callosum. Increased associated vasogenic edema in the right parietal lobe. Recommend MRI brain with and without contrast for further evaluation. 2. Soft tissue swelling in the left facial soft tissues, concerning for possible contusion. 3. Similar evolving post-biopsy changes in the right temporoparietal lobes.  Electronically signed by: Donnice Mania MD 11/03/2024 12:48 PM EST RP Workstation: [id number]   [TY]    Clinical Course User Index [TY] Neysa Caron PARAS, DO                                 Medical Decision Making This is a 68 year old male presenting emergency department with generalized weakness.  Significant comorbidities to include glioblastoma, chronic atrial fibrillation on Eliquis , hypertension, hyperlipidemia.  He is afebrile nontachycardic, is hypertensive maintaining oxygen saturation on room air.  Well-appearing on exam with no signs of trauma despite a fall earlier today.  No neurodeficits, does have some generalized weakness on exam.  His workup with mildly elevated troponin.  EKG without overt ischemic changes however.  Possibly elevated blood pressure?  Possibly related to chemotherapy agents?  He has no significant metabolic derangements.  Normal renal function.  No transaminitis to suggest acute hepatobiliary disease, soft nontender abdomen as well.  UA  without evidence of UTI.  No leukocytosis or anemia that would explain his weakness.  CT head with progressive disease with some edema.  Given Decadron .  Case discussed with family medicine who agrees to accept patient.  Amount and/or Complexity of Data Reviewed Independent Historian:     Details: Family member notes that he is a paced External Data Reviewed:     Details: A-fib on Eliquis  Labs: ordered. Radiology: ordered and independent interpretation performed. Decision-making details documented in ED Course.    Details: Does have worsening disease on independent review. ECG/medicine tests: ordered and independent interpretation performed.    Details: No ischemic changes  Risk Prescription drug management. Decision regarding hospitalization.      Final diagnoses:  Weakness  Hypertensive urgency    ED Discharge Orders     None          Neysa Caron PARAS, DO 11/03/24 1634  "

## 2024-11-03 NOTE — Assessment & Plan Note (Addendum)
 Reassuringly, no focal deficits on exam or herniation/midline shift on CT. CT head with increased size of known R parietal mass and increased vasogenic edema, s/p IV Decadron  10mg  x1, IV hydralazine  10mg  x1, Keppra  1000mg  x1 - MRI brain w/wo contrast - Spoke with patient's neuro oncologist Dr. [name], recommended IV Decadron  4mg  q6hrs, then likely PO Decadron  4mg  BID upon discharge. The CT findings may very well be consistent with typical post radiation changes. Currently no need to consult neurosurgery. - Consider consult prior to d/c for taper regimen - Per Dr. [name], he would be available Monday 12/29 to evaluate the patient, can secure chat in the meantime. 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 Patient's spouse is unsure of how many days off they are per med rec. Consider discussing with neuro oncology before restarting. - Pain: Tylenol  325 mg q4hrs PRN, Oxycodone  5 mg q6hrs PRN - Palliative care consult

## 2024-11-03 NOTE — Assessment & Plan Note (Signed)
 Overall stable, afebrile and no focal deficits on exam which is reassuring. No signs of trauma from fall. Likely 2/2 glioblastoma. - Admit to [facility], tele, attending Dr. Donah - PT/OT consult - AM BMP, Mg, CBC - Rest of plan as below

## 2024-11-03 NOTE — ED Notes (Signed)
 Pt returned from CT, now being transported to x-ray.

## 2024-11-03 NOTE — ED Triage Notes (Signed)
 Pt bib [facility] from home, wife said he had a mechanical fall this morning but did not want to go to hospital. Wife called physician and wanted him seen. Hx of brain cancer, taken off steroids 6 days ago and reports increasing weakness and lethargy. Pt reports he did not hit his head. Pt is on Eliquis .   20g L hand  NS 177/96 141 cbg

## 2024-11-03 NOTE — H&P (Addendum)
 "    Hospital Admission History and Physical Service Pager: [phone]  Patient name: Wayne Molina Medical record number: [id number] Date of Birth: [birth year] Age: 68 y.o. Gender: male  Primary Care Provider: Janna Ferrier, DO Consultants: Neuro oncology Code Status: Full code Preferred Emergency Contact:  Contact Information     Name Relation Home Work Mobile   Leonor,[name] Spouse   [phone]   Cost,Rodney Brother [phone]     Ricciardelli,Angelina Daughter [phone]  [phone]      Other Contacts   None on File     Chief Complaint: Generalized weakness  Assessment and Plan: Wayne Molina is a 68 y.o. male with PMH glioblastoma, chronic A-fib on Eliquis , HTN, HLD, OSA, anxiety/depression and h/o seizures 2/2 brain mass presenting with generalized weakness. Infectious etiology possible, though less likely as patient is afebrile and without leukocytosis. UA without evidence of UTI and no reported viral sx. Normal Hgb so anemia not likely. Presentation likely due to glioblastoma for which he is being currently treated. CT head with progressive worsening of previous  mass and increased vasogenic edema making this more likely.  Of note, EKG with A-fib and diffuse T wave inversions however unchanged from prior in September. Additionally, pt without any significant chest pain or shortness of breath. Troponin elevated to 60 but this could also be demand ischemia. He was slightly tachycardic on exam as well, has known afib. BP and HR likely elevated as patient  has not taken any home meds today.  Assessment & Plan Generalized weakness Fall Overall stable, afebrile and no focal deficits on exam which is reassuring. No signs of trauma from fall. Likely 2/2 glioblastoma. - Admit to FMTS, tele, attending Dr. Donah - PT/OT consult - AM BMP, Mg, CBC - Rest of plan as below Glioblastoma, IDH-wildtype (HCC) Vasogenic edema (HCC) Reassuringly, no focal deficits on exam or  herniation/midline shift on CT. CT head with increased size of known R parietal mass and increased vasogenic edema, s/p IV Decadron  10mg  x1, IV hydralazine  10mg  x1, Keppra  1000mg  x1 - MRI brain w/wo contrast - Spoke with patient's neuro oncologist Dr. [name], recommended IV Decadron  4mg  q6hrs, then likely PO Decadron  4mg  BID upon discharge. The CT findings may very well be consistent with typical post radiation changes. Currently no need to consult neurosurgery. - Consider consult prior to d/c for taper regimen - Per Dr. [name], he would be available Monday 12/29 to evaluate the patient, can secure chat in the meantime. 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 Patient's spouse is unsure of how many days off they are per med rec. Consider discussing with neuro oncology before restarting. - Pain: Tylenol  325 mg q4hrs PRN, Oxycodone  5 mg q6hrs PRN - Palliative care consult Atrial fibrillation with RVR (HCC) Irregularly irregular on EKG with HR up to 130s at bedside and BP elevated to 170/91 . HR 120 on EKG. Currently not rate-controlled. Anticipate improvement upon continuation of home meds  - did not take today. - Continue home Eliquis  5 mg BID, Carvedilol  12.5 mg BID - Serial troponin - Consider consult to cardiology if unable to achieve rate control or troponin trends up - Continuous cardiac monitoring, pulse ox - AM BMP, Mg Chronic health problem HTN - continue home Carvedilol  12.5 mg BID HLD - continue home Atorvastatin  40 mg daily Anxiety/depression - continue  home Quetiapine  20 mg daily GERD - continue home Pantoprazole  40 mg daily   FEN/GI: Regular diet VTE Prophylaxis: Eliquis   Disposition: Telemetry  History of Present Illness:  Wayne Molina is a 68 y.o. male presenting with fall and generalized weakness  Had a fall today. Was walking to the  bathroom and his knees buckled. He did not have any preceding CP, SOB, palpitation, dizziness, tunnel vision/LOC, N/V. No recent fevers or illness. Landed on his buttocks. Did not hit  his head, denies LOC. Per wife, he was unable to recall his name this morning when EMS arrived. She also reports increased generalized shaking since this AM.  They called oncology this AM and were instructed to go to [facility] but were brought to [facility] by EMS due to findings of Afib and T wave abnormalities on EKG.  History of brain cancer. Was tapered off of steroids recently, most recent dose prior to arrival was last Saturday 12/20. Per patient and spouse this type of weakness tends to happen whenever he comes off of steroids. They also report decreased PO intake which is common with tapering  No new focal numbness, facial droop, speech or swallowing difficulty. Family reports some cognitive slowing recently since he's been off of steroids but nothing new.  In the ED, labs significant for Tbili 1.8, troponin 60 and 30 protein in UA. EKG with Afib and diffuse T wave inversion. CT head showed slightly increased size of the right parietal mass with increased involvement of the right splenium of the corpus callosum; increased associated vasogenic edema in the right parietal lobe. Received decadron , hydralazine , keppra .  Review Of Systems: Per HPI with the following additions: None  Pertinent Past Medical History: Glioblastoma on radiation therapy HTN HLD Chronic A-fib Anxiety/depression OSA Hx of seizures 2/2 brain mass  Remainder reviewed in history tab.   Pertinent Past Surgical History: Stereotactic stimulator insertion neurosurgery Atrial fibrillation Application of cranial navigation Hip fracture Foot fracture ICD  Remainder reviewed in history tab.   Pertinent Social History: Tobacco use: None Alcohol use: None Other Substance use: None Lives with wife  Pertinent Family  History: Mother - dementia, stroke Father - heart disease, T2DM Brother - kidney disease *Family history of colon and gynecologic cancers in 2nd degree relatives  Remainder reviewed in history tab.   Important Outpatient Medications: Acetaminophen  325 mg q4hrs PRN Eliquis  5 mg BID Atorvastatin  40 mg daily Calcium  carbonate 1500 mg BID Carvedilol  12.5 mg BID Vitamin B12 daily Gabapentin  100 mg TID PRN Keppra  1000mg  BID Mag oxide 250mg  daily Melatonin 3mg  dialy Multivitamin daily Zofran  q8hrs PRN Oxycodone  5 mg q6hrs PRN Pantoprazole  40 mg daily MiraLAX  daily Quetiapine  20 mg daily Tamsulosin  0.4 mg daily PRN Temozolomide  280 mg (take for 5 days, hold for 23 days; repeat q28 days)  *Has not taken any meds today  Remainder reviewed in medication history.   Objective: BP (!) 153/96   Pulse 92   Temp 99.3 F (37.4 C) (Oral)   Resp 20   Ht 5' 8 (1.727 m)   Wt 72.6 kg   SpO2 98%   BMI 24.33 kg/m   Exam: General: Alert, well-appearing male in NAD.  HEENT: No sign of trauma, EOM grossly intact. R eyelid drooping but this is not new. Cardiovascular: Irregularly irregular, no m/r/g appreciated. Radial/pedal pulse +2 bilaterally Pulmonary: Normal WOB. CTAB with no w/c/r present. Abdomen: Soft, non-tender, non-distended. Extremities: Lesion ~4cm in size with erythematous border and central dry patch on the posterior  aspect of the L calf, nonpruritic. Patient and spouse unsure of how long this has been there. Neurologic: Alert and appropriately responding to questions. PERRL. EOMI. Normal sensation in V1, V2, V3. Symmetric smile and brow raise. Normal hearing. Symmetric palate raise. 5/5 shoulder shrug. Symmetric tongue protrusion. UE and LE strength 5/5. Normal sensation in UE and LE bilaterally.  Psych: Appropriate mood and affect  Labs:  CBC BMET  Recent Labs  Lab 11/03/24 1203  WBC 6.5  HGB 13.4  HCT 40.7  PLT 220   Recent Labs  Lab 11/03/24 1325  NA 139  K  4.3  CL 106  CO2 22  BUN 18  CREATININE 0.77  GLUCOSE 94  CALCIUM  8.6*     Pertinent additional labs: CMP: albumin 3.4, Tbili 1.8 Troponin 60  UA: 30 protein  EKG: Afib, diffuse T wave inversion  Imaging Studies Performed: DG Chest 2 View Result Date: 11/03/2024 CNo active cardiopulmonary disease.   CT Head Wo Contrast Result Date: 11/03/2024 1. Slightly increased size of the right parietal mass with increased involvement of the right splenium of the corpus callosum. Increased associated vasogenic edema in the right parietal lobe. Recommend MRI brain with and without contrast for further evaluation.  2. Soft tissue swelling in the left facial soft tissues, concerning for possible contusion.  3. Similar evolving post-biopsy changes in the right temporoparietal lobes.    Jerrie Gathers, DO 11/03/2024, 4:17 PM PGY-1, [facility]  FPTS Intern pager: [phone], text pages welcome Secure chat group [facility] Teaching Service    FPTS Upper-Level Resident Addendum   I have independently interviewed and examined the patient. I have discussed the above with Dr. Jerrie and agree with the documented plan. My edits for correction/addition/clarification are included above. Please see any attending notes.   Payton Coward, MD PGY-3, [facility] 11/03/2024 6:33 PM  FPTS Service pager: [phone] (text pages welcome through AMION) "

## 2024-11-03 NOTE — Assessment & Plan Note (Addendum)
 Irregularly irregular on EKG with HR up to 130s at bedside and BP elevated to 170/91 . HR 120 on EKG. Currently not rate-controlled. Anticipate improvement upon continuation of home meds  - did not take today. - Continue home Eliquis  5 mg BID, Carvedilol  12.5 mg BID - Serial troponin - Consider consult to cardiology if unable to achieve rate control or troponin trends up - Continuous cardiac monitoring, pulse ox - AM BMP, Mg

## 2024-11-03 NOTE — Plan of Care (Signed)
 FMTS Interim Progress Note  S:Night rounded on pt. Wife at bedside. Pt reports that he feel anxious, but denies chest pain, SOB. He has not taken meds since last night, so has not taken Coreg  this AM.  O: BP (!) 148/96   Pulse 92   Temp 99.3 F (37.4 C) (Oral)   Resp (!) 23   Ht 5' 8 (1.727 m)   Wt 72.6 kg   SpO2 96%   BMI 24.33 kg/m   Gen: Alert, laying in bed, speaking in full sentences. NAD CV: RRR Resp: CTAB. Normal WOB on RA.  A/P:  Afib RVR Asymptomatic and maintaining MAPs. Likely due to missing AM Coreg . - Restarted home coreg  - If RVR persistent, will consider spot dosing IV metop  Anxiety - Restarted home seroquel  and melatonin and giving now.   Rest of plan per H&P   Elicia Hamlet, MD 11/03/2024, 7:15 PM PGY-3, [facility] Family Medicine Service pager [phone]

## 2024-11-03 NOTE — Telephone Encounter (Signed)
 Pt wife called and stated that he was weak and couldn't get around at all. Pt had fallen the day prior and ambulance had been called to help assist getting him off of the floor. Wife stated that pt could not tell responders his name. She also states that pt had been weaned off of steroids last week and has not been eating or drinking much and feels like he has declined since. Recommended to wife for pt to taking to the ER for further evaluation. Pt wife stated that she could not take him due to his weakness and will speak with him about calling EMS to transport him. Wife verbalized understanding.

## 2024-11-03 NOTE — Assessment & Plan Note (Signed)
 HTN - continue home Carvedilol  12.5 mg BID HLD - continue home Atorvastatin  40 mg daily Anxiety/depression - continue home Quetiapine  20 mg daily GERD - continue home Pantoprazole  40 mg daily

## 2024-11-03 NOTE — ED Notes (Signed)
 Family at bedside, states that he has not taken any medications today. He has not had his seizure medication or gabapentin  for his leg pain. Young, DO notified.

## 2024-11-04 ENCOUNTER — Other Ambulatory Visit (HOSPITAL_COMMUNITY): Payer: Self-pay

## 2024-11-04 DIAGNOSIS — R531 Weakness: Secondary | ICD-10-CM

## 2024-11-04 DIAGNOSIS — L304 Erythema intertrigo: Secondary | ICD-10-CM

## 2024-11-04 DIAGNOSIS — Z515 Encounter for palliative care: Secondary | ICD-10-CM

## 2024-11-04 LAB — CBC
HCT: 39.6 % (ref 39.0–52.0)
Hemoglobin: 13.6 g/dL (ref 13.0–17.0)
MCH: 32.7 pg (ref 26.0–34.0)
MCHC: 34.3 g/dL (ref 30.0–36.0)
MCV: 95.2 fL (ref 80.0–100.0)
Platelets: 213 K/uL (ref 150–400)
RBC: 4.16 MIL/uL — ABNORMAL LOW (ref 4.22–5.81)
RDW: 13.8 % (ref 11.5–15.5)
WBC: 4.3 K/uL (ref 4.0–10.5)
nRBC: 0 % (ref 0.0–0.2)

## 2024-11-04 LAB — COMPREHENSIVE METABOLIC PANEL WITH GFR
ALT: 26 U/L (ref 0–44)
AST: 25 U/L (ref 15–41)
Albumin: 3.7 g/dL (ref 3.5–5.0)
Alkaline Phosphatase: 46 U/L (ref 38–126)
Anion gap: 13 (ref 5–15)
BUN: 16 mg/dL (ref 8–23)
CO2: 21 mmol/L — ABNORMAL LOW (ref 22–32)
Calcium: 8.8 mg/dL — ABNORMAL LOW (ref 8.9–10.3)
Chloride: 104 mmol/L (ref 98–111)
Creatinine, Ser: 0.69 mg/dL (ref 0.61–1.24)
GFR, Estimated: >60 mL/min
Glucose, Bld: 158 mg/dL — ABNORMAL HIGH (ref 70–99)
Potassium: 3.8 mmol/L (ref 3.5–5.1)
Sodium: 137 mmol/L (ref 135–145)
Total Bilirubin: 2.1 mg/dL — ABNORMAL HIGH (ref 0.0–1.2)
Total Protein: 6.4 g/dL — ABNORMAL LOW (ref 6.5–8.1)

## 2024-11-04 LAB — TROPONIN T, HIGH SENSITIVITY
Troponin T High Sensitivity: 47 ng/L — ABNORMAL HIGH (ref 0–19)
Troponin T High Sensitivity: 66 ng/L — ABNORMAL HIGH (ref 0–19)

## 2024-11-04 LAB — MAGNESIUM: Magnesium: 2.1 mg/dL (ref 1.7–2.4)

## 2024-11-04 MED ORDER — GUAIFENESIN-DM 100-10 MG/5ML PO SYRP
5.0000 mL | ORAL_SOLUTION | ORAL | Status: DC | PRN
  Administered 2024-11-04: 5 mL via ORAL
  Filled 2024-11-04: qty 5

## 2024-11-04 MED ORDER — NYSTATIN 100000 UNIT/GM EX POWD
Freq: Two times a day (BID) | CUTANEOUS | Status: DC
  Administered 2024-11-09: 1 via TOPICAL
  Filled 2024-11-04: qty 15

## 2024-11-04 NOTE — Progress Notes (Cosign Needed)
 "    Daily Progress Note Intern Pager: [phone]  Patient name: Wayne Molina Medical record number: [id number] Date of birth: [birth year] Age: 68 y.o. Gender: male  Primary Care Provider: Janna Ferrier, DO Consultants: Neuro oncology Code Status: Full code   Pt Overview and Major Events to Date:  12/26 - Admitted  Medical Decision Making:  Wayne Molina is a-year-old male with a PMH of glioblastoma, chronic A-fib on Eliquis , HTN, HLD, OSA, anxiety/depression, and history of seizures 2/2 brain mass who presented with generalized weakness and a fall.  This is most likely due to his known glioblastoma.   Patient also found to be in A-fib with RVR.  He had missed his home medications due to being in the emergency department.  Now back on home meds with improving rate control. Assessment & Plan Generalized weakness Fall Overall stable, likely 2/2 glioblastoma. Total bilirubin uptrending 1.8 --> 2.1.  - AM CMP - Rest of plan as below Glioblastoma, IDH-wildtype (HCC) Vasogenic edema (HCC) MRI with continued increase in peripheral enhancement of known glioblastoma with increased surrounding FLAIR hyperintense signal, tumor progression and/or post-treatment change.  - Neuro-oncology consulted, appreciate recommendations  - No chemo inpatient - Consider consult prior to d/c for taper regimen - Per Dr. [name], he would be available Monday 12/29 to evaluate the patient, can secure chat in the meantime. May need to re-consult on Monday - Continue home Keppra  1000mg  BID for seizures 2/2 to brain mass  - Pain: Tylenol  325 mg q4hrs PRN, Oxycodone  5 mg q6hrs PRN - Palliative care consult Atrial fibrillation with RVR (HCC) Troponin downtrending 66 --> 47. Rate - Continue home Eliquis  5 mg BID, Carvedilol  12.5 mg BID - Consider consult to cardiology if unable to achieve rate control - Continuous cardiac monitoring, pulse ox - AM CMP Intertrigo Intertrigo present between left thigh and scrotum.   Wife states this is there because he wears a diaper at nighttime and sleeps on his left side.  She uses a powder at home that she states works well. - Nystatin  topical powder twice daily Chronic health problem HTN - continue home Carvedilol  12.5 mg BID HLD - continue home Atorvastatin  40 mg daily Anxiety/depression - continue home Quetiapine  20 mg daily GERD - continue home Pantoprazole  40 mg daily  FEN/GI: regular  PPx: Eliquis  Dispo: Home pending treatment of neurogenic edema.    Subjective:  Patient seen lying in bed resting comfortably.  Wife is present at bedside.  Wife states she was with patient yesterday when he began to fall she was able to help guide him gently to the floor.  She states that while getting the patient's MRI yesterday the plastic of the table pulled the patient's skin on his left hip.  She also states that his memory seems to be worse since yesterday.  She states that he will remember things they talked about a few minutes before.  The patient himself states he is doing all right this morning.  He denies any pain at this time.  Objective: Temp:  [98.1 F (36.7 C)-99.3 F (37.4 C)] 98.1 F (36.7 C) (12/27 0418) Pulse Rate:  [85-127] 92 (12/27 0418) Resp:  [14-26] 17 (12/27 0418) BP: (121-179)/(75-146) 121/76 (12/27 0418) SpO2:  [96 %-100 %] 96 % (12/27 0418) Weight:  [72.6 kg] 72.6 kg (12/26 1201) Physical Exam: General: NAD Cardiovascular: regular rate, irregularly, irregular rhythm  Respiratory: CTAB, normal work of breathing on room air  Abdomen: soft, non-distended, non-tender to palpation  Extremities: warm,  dry, no edema Skin: well demarcated, erythematous skin between left thigh and scrotum, lateral left hip with skin   Laboratory: Most recent CBC Lab Results  Component Value Date   WBC 4.3 11/04/2024   HGB 13.6 11/04/2024   HCT 39.6 11/04/2024   MCV 95.2 11/04/2024   PLT 213 11/04/2024   Most recent BMP    Latest Ref Rng & Units 11/04/2024     1:34 AM  BMP  Glucose 70 - 99 mg/dL 841   BUN 8 - 23 mg/dL 16   Creatinine 9.38 - 1.24 mg/dL 9.30   Sodium 864 - 854 mmol/L 137   Potassium 3.5 - 5.1 mmol/L 3.8   Chloride 98 - 111 mmol/L 104   CO2 22 - 32 mmol/L 21   Calcium  8.9 - 10.3 mg/dL 8.8     Other pertinent labs:  Total Bilirubin 2.1   Imaging/Diagnostic Tests:  MRI Brain Radiologist Impression: Continued increase in peripheral enhancement of the known glioblastoma with increased surrounding FLAIR hyperintense signal, which could represent tumor progression and/or post-treatment change.  [name], DO 11/04/2024, 7:29 AM  PGY-1, [facility] Family Medicine FPTS Intern pager: [phone], text pages welcome Secure chat group [facility] Teaching Service   "

## 2024-11-04 NOTE — Progress Notes (Signed)
 Inpatient Rehab Admissions Coordinator Note:   Per therapy patient was screened for CIR candidacy by Chundra Sauerwein SHAUNNA Yvone Cohens, CCC-SLP. At this time, pt appears to be a potential candidate for CIR. If pt would like to be considered, please place an IP Rehab MD consult order.    Tinnie Yvone Cohens, MS, CCC-SLP Admissions Coordinator [phone] 11/04/2024 6:02 PM

## 2024-11-04 NOTE — Evaluation (Signed)
 Physical Therapy Evaluation Patient Details Name: Wayne Molina MRN: [id number] DOB: 1956-02-18 Today's Date: 11/04/2024  History of Present Illness  68 y/o M presenting to ED on 12/26 with fall, generalized weakness, CT head with progressive worsening of previous mass and incr vasogenic edema.    PMH includes glioblastoma, A fib on Eliquis , HTN, HLD, OSA, anxiety/depression, seizures 2/2 brain mass  Clinical Impression  Pt presents with admitting diagnosis above. Co-treat with OT. Pt able to ambulate to door and back with RW +2 Mod A. Constant cues for proximity to RW, safety, and sequencing. PTA pt was ambulatory short distances with RW per wife however recently has needed a lot more assistance. Patient will benefit from intensive inpatient follow-up therapy, >3 hours/day. PT will continue to follow.         If plan is discharge home, recommend the following: Two people to help with walking and/or transfers;A lot of help with bathing/dressing/bathroom;Assistance with cooking/housework;Assist for transportation;Help with stairs or ramp for entrance;Supervision due to cognitive status   Can travel by private vehicle        Equipment Recommendations Hospital bed  Recommendations for Other Services  Rehab consult    Functional Status Assessment Patient has had a recent decline in their functional status and demonstrates the ability to make significant improvements in function in a reasonable and predictable amount of time.     Precautions / Restrictions Precautions Precautions: Fall Restrictions Weight Bearing Restrictions Per Provider Order: No      Mobility  Bed Mobility Overal bed mobility: Needs Assistance Bed Mobility: Supine to Sit     Supine to sit: Contact guard     General bed mobility comments: Increased time but no physical assistance.    Transfers Overall transfer level: Needs assistance Equipment used: Rolling walker (2 wheels) Transfers: Sit to/from  Stand Sit to Stand: Min assist, +2 physical assistance                Ambulation/Gait Ambulation/Gait assistance: +2 physical assistance, +2 safety/equipment, Mod assist Gait Distance (Feet): 20 Feet Assistive device: Rolling walker (2 wheels) Gait Pattern/deviations: Trunk flexed, Wide base of support, Shuffle, Decreased stride length, Step-through pattern Gait velocity: decreased     General Gait Details: Pt able to ambulate to door and back with RW +2 Mod A. Constant cues for proximity to RW, safety, and sequencing.  Stairs            Wheelchair Mobility     Tilt Bed    Modified Rankin (Stroke Patients Only)       Balance Overall balance assessment: Needs assistance Sitting-balance support: Feet supported Sitting balance-Leahy Scale: Good     Standing balance support: During functional activity, Reliant on assistive device for balance Standing balance-Leahy Scale: Poor Standing balance comment: tactile cues to weightshift, cues to keep RW close                             Pertinent Vitals/Pain Pain Assessment Pain Assessment: No/denies pain    Home Living Family/patient expects to be discharged to:: Private residence Living Arrangements: Spouse/significant other Available Help at Discharge: Family;Available 24 hours/day Type of Home: House Home Access: Stairs to enter Entrance Stairs-Rails: None Entrance Stairs-Number of Steps: 1 threshold step   Home Layout: One level Home Equipment: Pharmacist, Hospital (2 wheels);Wheelchair - manual;BSC/3in1      Prior Function Prior Level of Function : Independent/Modified Independent;History of Falls (last six months)  Mobility Comments: ambulatory short distances to bathroom with RW ADLs Comments: assist for bathing, ind with toileting, ind with dressing and grooming tasks     Extremity/Trunk Assessment   Upper Extremity Assessment Upper Extremity Assessment:  Generalized weakness    Lower Extremity Assessment Lower Extremity Assessment: Generalized weakness    Cervical / Trunk Assessment Cervical / Trunk Assessment: Normal  Communication   Communication Communication: No apparent difficulties    Cognition Arousal: Alert Behavior During Therapy: Flat affect                             Following commands: Impaired Following commands impaired: Follows one step commands inconsistently     Cueing Cueing Techniques: Verbal cues     General Comments General comments (skin integrity, edema, etc.): VSS on RA    Exercises     Assessment/Plan    PT Assessment Patient needs continued PT services  PT Problem List Decreased strength;Decreased activity tolerance;Decreased range of motion;Decreased balance;Decreased mobility;Decreased coordination;Decreased cognition;Decreased safety awareness;Decreased knowledge of use of DME;Cardiopulmonary status limiting activity;Decreased knowledge of precautions       PT Treatment Interventions DME instruction;Gait training;Stair training;Functional mobility training;Therapeutic exercise;Therapeutic activities;Balance training;Patient/family education;Neuromuscular re-education;Cognitive remediation    PT Goals (Current goals can be found in the Care Plan section)  Acute Rehab PT Goals Patient Stated Goal: to go home PT Goal Formulation: With patient Time For Goal Achievement: 11/18/24 Potential to Achieve Goals: Fair    Frequency Min 2X/week     Co-evaluation PT/OT/SLP Co-Evaluation/Treatment: Yes Reason for Co-Treatment: Complexity of the patient's impairments (multi-system involvement);To address functional/ADL transfers;For patient/therapist safety PT goals addressed during session: Mobility/safety with mobility;Proper use of DME;Balance         AM-PAC PT 6 Clicks Mobility  Outcome Measure Help needed turning from your back to your side while in a flat bed without using  bedrails?: A Little Help needed moving from lying on your back to sitting on the side of a flat bed without using bedrails?: A Little Help needed moving to and from a bed to a chair (including a wheelchair)?: A Lot Help needed standing up from a chair using your arms (e.g., wheelchair or bedside chair)?: A Lot Help needed to walk in hospital room?: A Lot Help needed climbing 3-5 steps with a railing? : Total 6 Click Score: 13    End of Session Equipment Utilized During Treatment: Gait belt Activity Tolerance: Patient tolerated treatment well Patient left: in chair;with call bell/phone within reach;with chair alarm set;with family/visitor present Nurse Communication: Mobility status PT Visit Diagnosis: Other abnormalities of gait and mobility (R26.89)    Time: 9047-8981 PT Time Calculation (min) (ACUTE ONLY): 26 min   Charges:   PT Evaluation $PT Eval Moderate Complexity: 1 Mod PT Treatments $Gait Training: 8-22 mins PT General Charges $$ ACUTE PT VISIT: 1 Visit         Sueellen NOVAK, PT, DPT Acute Rehab Services [phone]   Lennie Vasco 11/04/2024, 2:55 PM

## 2024-11-04 NOTE — Progress Notes (Signed)
 PT Cancellation Note  Patient Details Name: Wayne [facility]ina MRN: [id number] DOB: [birth year]   Cancelled Treatment:    Reason Eval/Treat Not Completed: Patient declined, no reason specified (Pt eating breakfast. Politely requesting PT and OT to come back later.)   Darbi Chandran 11/04/2024, 9:21 AM

## 2024-11-04 NOTE — Assessment & Plan Note (Addendum)
 HTN - continue home Carvedilol  12.5 mg BID HLD - continue home Atorvastatin  40 mg daily Anxiety/depression - continue home Quetiapine  20 mg daily GERD - continue home Pantoprazole  40 mg daily

## 2024-11-04 NOTE — Assessment & Plan Note (Addendum)
 Overall stable, likely 2/2 glioblastoma. Total bilirubin uptrending 1.8 --> 2.1.  - AM CMP - Rest of plan as below

## 2024-11-04 NOTE — Assessment & Plan Note (Addendum)
 MRI with continued increase in peripheral enhancement of known glioblastoma with increased surrounding FLAIR hyperintense signal, tumor progression and/or post-treatment change.  - Neuro-oncology consulted, appreciate recommendations  - No chemo inpatient - Consider consult prior to d/c for taper regimen - Per Dr. [name], he would be available Monday 12/29 to evaluate the patient, can secure chat in the meantime. 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

## 2024-11-04 NOTE — Assessment & Plan Note (Addendum)
 Troponin downtrending 66 --> 47. Rate - Continue home Eliquis  5 mg BID, Carvedilol  12.5 mg BID - Consider consult to cardiology if unable to achieve rate control - Continuous cardiac monitoring, pulse ox - AM CMP

## 2024-11-04 NOTE — Assessment & Plan Note (Signed)
 Intertrigo present between left thigh and scrotum.  Wife states this is there because he wears a diaper at nighttime and sleeps on his left side.  She uses a powder at home that she states works well. - Nystatin  topical powder twice daily

## 2024-11-04 NOTE — Progress Notes (Signed)
 Patient voided into urinal and urine yellow, no obvious blood in the urinal, but afterwards, frank blood noted to be leaking out of penis. Dr. Lennie with Family Medicine Teaching Services made aware.

## 2024-11-04 NOTE — Evaluation (Signed)
 Occupational Therapy Evaluation Patient Details Name: Wayne Molina MRN: [id number] DOB: 1956/03/27 Today's Date: 11/04/2024   History of Present Illness   68 y/o M presenting to ED on 12/26 with fall, generalized weakness, CT head with progressive worsening of previous mass and incr vasogenic edema.    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 Pt from home with spouse, spouse present 24/7 to assist. Pt currently needs up to mod A for ADLs, CGA for bed mobility and min +2 for transfers with RW, multimodal cues to weight shift and keeping RW close to body. Pt with L visual field deficits, running into multiple items on L side of room. Pt presenting with impairments listed below, will follow acutely. Patient will benefit from intensive inpatient follow-up therapy, >3 hours/day to maximize safety/ind with ADL/functional mobility.      If plan is discharge home, recommend the following:   A lot of help with walking and/or transfers;A lot of help with bathing/dressing/bathroom;Assistance with cooking/housework;Direct supervision/assist for medications management;Direct supervision/assist for financial management;Assist for transportation;Help with stairs or ramp for entrance     Functional Status Assessment   Patient has had a recent decline in their functional status and demonstrates the ability to make significant improvements in function in a reasonable and predictable amount of time.     Equipment Recommendations   None recommended by OT     Recommendations for Other Services   PT consult;Rehab consult     Precautions/Restrictions   Precautions Precautions: Fall Restrictions Weight Bearing Restrictions Per Provider Order: No     Mobility Bed Mobility Overal bed mobility: Needs Assistance Bed  Mobility: Supine to Sit     Supine to sit: Contact guard          Transfers Overall transfer level: Needs assistance Equipment used: Rolling walker (2 wheels) Transfers: Sit to/from Stand Sit to Stand: Min assist, +2 physical assistance                  Balance Overall balance assessment: Needs assistance Sitting-balance support: Feet supported Sitting balance-Leahy Scale: Good     Standing balance support: During functional activity, Reliant on assistive device for balance Standing balance-Leahy Scale: Poor Standing balance comment: tactile cues to weightshift, cues to keep RW close                           ADL either performed or assessed with clinical judgement   ADL Overall ADL's : Needs assistance/impaired Eating/Feeding: Set up   Grooming: Set up   Upper Body Bathing: Moderate assistance   Lower Body Bathing: Moderate assistance   Upper Body Dressing : Moderate assistance   Lower Body Dressing: Moderate assistance   Toilet Transfer: Moderate assistance;+2 for physical assistance;Ambulation;Rolling walker (2 wheels)   Toileting- Clothing Manipulation and Hygiene: Moderate assistance       Functional mobility during ADLs: Moderate assistance;Rolling walker (2 wheels);+2 for physical assistance       Vision   Vision Assessment?: Vision impaired- to be further tested in functional context;Yes Eye Alignment: Within Functional Limits Ocular Range of Motion: Impaired-to be further tested in functional context Additional Comments: L visual field deficit, R eye lid Ptosis     Perception Perception: Not tested       Praxis Praxis: Not tested  Pertinent Vitals/Pain Pain Assessment Pain Assessment: No/denies pain     Extremity/Trunk Assessment Upper Extremity Assessment Upper Extremity Assessment: Generalized weakness   Lower Extremity Assessment Lower Extremity Assessment: Defer to PT evaluation   Cervical / Trunk  Assessment Cervical / Trunk Assessment: Normal   Communication Communication Communication: No apparent difficulties   Cognition Arousal: Alert Behavior During Therapy: Flat affect Cognition: Cognition impaired   Orientation impairments: Situation Awareness: Online awareness impaired Memory impairment (select all impairments): Short-term memory Attention impairment (select first level of impairment): Sustained attention Executive functioning impairment (select all impairments): Initiation, Problem solving                   Following commands: Impaired Following commands impaired: Follows one step commands inconsistently     Cueing  General Comments   Cueing Techniques: Verbal cues  VSS on RA   Exercises     Shoulder Instructions      Home Living Family/patient expects to be discharged to:: Private residence Living Arrangements: Spouse/significant other Available Help at Discharge: Family;Available 24 hours/day Type of Home: House Home Access: Stairs to enter [employer] of Steps: 1 threshold step Entrance Stairs-Rails: None Home Layout: One level     Bathroom Shower/Tub: Chief Strategy Officer: Standard     Home Equipment: Pharmacist, Hospital (2 wheels);Wheelchair - manual;BSC/3in1          Prior Functioning/Environment Prior Level of Function : Independent/Modified Independent;History of Falls (last six months)             Mobility Comments: ambulatory short distances to bathroom with RW ADLs Comments: assist for bathing, ind with toileting, ind with dressing and grooming tasks    OT Problem List: Decreased strength;Decreased range of motion;Decreased activity tolerance;Impaired balance (sitting and/or standing);Decreased cognition;Decreased safety awareness;Impaired vision/perception;Decreased coordination   OT Treatment/Interventions: Self-care/ADL training;Therapeutic exercise;Energy conservation;DME and/or AE  instruction;Therapeutic activities;Patient/family education;Balance training;Cognitive remediation/compensation;Visual/perceptual remediation/compensation      OT Goals(Current goals can be found in the care plan section)   Acute Rehab OT Goals Patient Stated Goal: none stated OT Goal Formulation: With patient Time For Goal Achievement: 11/18/24 Potential to Achieve Goals: Good ADL Goals Pt Will Perform Upper Body Dressing: sitting;standing;with supervision Pt Will Perform Lower Body Dressing: sitting/lateral leans;sit to/from stand;with supervision Pt Will Transfer to Toilet: with contact guard assist;ambulating;regular height toilet Pt Will Perform Tub/Shower Transfer: Shower transfer;with contact guard assist;ambulating;shower seat   OT Frequency:  Min 2X/week    Co-evaluation              AM-PAC OT 6 Clicks Daily Activity     Outcome Measure Help from another person eating meals?: A Little Help from another person taking care of personal grooming?: A Little Help from another person toileting, which includes using toliet, bedpan, or urinal?: A Lot Help from another person bathing (including washing, rinsing, drying)?: A Lot Help from another person to put on and taking off regular upper body clothing?: A Lot Help from another person to put on and taking off regular lower body clothing?: A Lot 6 Click Score: 14   End of Session Equipment Utilized During Treatment: Gait belt;Rolling walker (2 wheels) Nurse Communication: Mobility status  Activity Tolerance: Patient tolerated treatment well Patient left: with call bell/phone within reach;with chair alarm set;in chair  OT Visit Diagnosis: Unsteadiness on feet (R26.81);Other abnormalities of gait and mobility (R26.89);Muscle weakness (generalized) (M62.81)                Time: 9048-8982  OT Time Calculation (min): 26 min Charges:  OT General Charges $OT Visit: 1 Visit OT Evaluation $OT Eval Moderate Complexity: 1  Mod  Japji Kok K, OTD, OTR/L SecureChat Preferred Acute Rehab [phone]   Laneta POUR Koonce 11/04/2024, 10:31 AM

## 2024-11-05 DIAGNOSIS — R531 Weakness: Secondary | ICD-10-CM | POA: Diagnosis not present

## 2024-11-05 LAB — URINALYSIS, ROUTINE W REFLEX MICROSCOPIC
Bacteria, UA: NONE SEEN
Bilirubin Urine: NEGATIVE
Glucose, UA: 150 mg/dL — AB
Ketones, ur: NEGATIVE mg/dL
Leukocytes,Ua: NEGATIVE
Nitrite: NEGATIVE
Protein, ur: 30 mg/dL — AB
Specific Gravity, Urine: 1.021 (ref 1.005–1.030)
pH: 6 (ref 5.0–8.0)

## 2024-11-05 LAB — COMPREHENSIVE METABOLIC PANEL WITH GFR
ALT: 23 U/L (ref 0–44)
AST: 22 U/L (ref 15–41)
Albumin: 3.7 g/dL (ref 3.5–5.0)
Alkaline Phosphatase: 41 U/L (ref 38–126)
Anion gap: 11 (ref 5–15)
BUN: 19 mg/dL (ref 8–23)
CO2: 23 mmol/L (ref 22–32)
Calcium: 9.1 mg/dL (ref 8.9–10.3)
Chloride: 103 mmol/L (ref 98–111)
Creatinine, Ser: 0.71 mg/dL (ref 0.61–1.24)
GFR, Estimated: >60 mL/min
Glucose, Bld: 169 mg/dL — ABNORMAL HIGH (ref 70–99)
Potassium: 4.1 mmol/L (ref 3.5–5.1)
Sodium: 137 mmol/L (ref 135–145)
Total Bilirubin: 1.5 mg/dL — ABNORMAL HIGH (ref 0.0–1.2)
Total Protein: 6.1 g/dL — ABNORMAL LOW (ref 6.5–8.1)

## 2024-11-05 MED ORDER — ACETAMINOPHEN 500 MG PO TABS
500.0000 mg | ORAL_TABLET | Freq: Four times a day (QID) | ORAL | Status: DC
  Administered 2024-11-05 – 2024-11-10 (×18): 500 mg via ORAL
  Filled 2024-11-05 (×3): qty 1

## 2024-11-05 NOTE — Assessment & Plan Note (Addendum)
 Physical exam stable. Will contact Dr. [name] about evaluating patient tomorrow.  - Neuro-oncology consulted, appreciate recommendations  - No chemo inpatient - Consider consult prior to d/c for taper regimen - Per Dr. [name], he would be available Monday 12/29 to evaluate the patient, can secure chat in the meantime. 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

## 2024-11-05 NOTE — Assessment & Plan Note (Addendum)
 Heart rate controlled. - Continue home Eliquis  5 mg BID, Carvedilol  12.5 mg BID - Continuous cardiac monitoring, pulse ox

## 2024-11-05 NOTE — Progress Notes (Signed)
 "    Daily Progress Note Intern Pager: [phone]  Patient name: JARROD MCENERY Medical record number: [id number] Date of birth: 1956/11/01 Age: 68 y.o. Gender: male  Primary Care Provider: Janna Ferrier, DO Consultants: Neuro oncology  Code Status: Full code   Pt Overview and Major Events to Date:  12/26: Admitted   Medical Decision Making:  Wayne Molina is a 68 year old male with PMH of glioblastoma, chronic A-fib on Eliquis , HTN, HLD, OSA, anxiety/depression, and history of seizure 2/2 brain mass who presented with generalized weakness and a fall.  This is most likely due to his known glioblastoma.  Patient also initially in A-fib with RVR after missing his medications due to being in the emergency department.  Now back on home meds, with rate improvement. Assessment & Plan Generalized weakness Fall Afebrile. Overall stable, weakness most likely secondary to known glioblastoma. Total bilirubin downtrending 2.1 --> 1.5. Patient awaiting formal CIR evaluation.  - Rest of plan as below Glioblastoma, IDH-wildtype (HCC) Vasogenic edema (HCC) Physical exam stable. Will contact Dr. [name] about evaluating patient tomorrow.  - Neuro-oncology consulted, appreciate recommendations  - No chemo inpatient - Consider consult prior to d/c for taper regimen - Per Dr. [name], he would be available Monday 12/29 to evaluate the patient, can secure chat in the meantime. 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  - Nystatin  topical powder twice daily Atrial fibrillation with RVR (HCC) (Resolved: 11/05/2024) Heart rate controlled. - Continue home Eliquis  5 mg BID, Carvedilol  12.5 mg BID - Continuous cardiac monitoring, pulse ox Chronic health problem HTN - continue home Carvedilol  12.5 mg BID HLD - continue  home Atorvastatin  40 mg daily Anxiety/depression - continue home Quetiapine  20 mg daily GERD - continue home Pantoprazole  40 mg daily     FEN/GI: regular PPx: Eliquis   Dispo: Home versus CIR pending clinical improvement.   Subjective:  Patient seen this morning lying in bed resting comfortably. He denies any pain at this time. Yesterday he had an incident of blood from the urethral meatus. His wife states that this happened again last night. He denies any pain with urination. His wife also mentions that he has begun coughing since being here in the hospital.   Objective: Temp:  [97.7 F (36.5 C)-98.1 F (36.7 C)] 97.9 F (36.6 C) (12/28 0500) Pulse Rate:  [70-97] 70 (12/28 0500) Resp:  [12-18] 14 (12/28 0500) BP: (121-152)/(79-98) 152/98 (12/28 0500) SpO2:  [95 %-97 %] 97 % (12/28 0500) Physical Exam: General: NAD Cardiovascular: regular rate, irregularly irregular rhythm  Respiratory: CTAB, normal work of breathing on room air  Abdomen: soft, non-distended, non-tender to palpation Extremities: dry, warm, no edema   Laboratory: Most recent CBC Lab Results  Component Value Date   WBC 4.3 11/04/2024   HGB 13.6 11/04/2024   HCT 39.6 11/04/2024   MCV 95.2 11/04/2024   PLT 213 11/04/2024   Most recent BMP    Latest Ref Rng & Units 11/05/2024    6:50 AM  BMP  Glucose 70 - 99 mg/dL 830   BUN 8 - 23 mg/dL 19   Creatinine 9.38 - 1.24 mg/dL 9.28   Sodium 864 - 854 mmol/L 137   Potassium 3.5 - 5.1 mmol/L 4.1   Chloride 98 - 111 mmol/L 103   CO2 22 - 32  mmol/L 23   Calcium  8.9 - 10.3 mg/dL 9.1     Other pertinent labs: Total bilirubin 1.5   Imaging/Diagnostic Tests: No new imaging.   [name], DO 11/05/2024, 7:43 AM  PGY-1, [facility] FPTS Intern pager: [phone], text pages welcome Secure chat group [facility] Teaching Service   "

## 2024-11-05 NOTE — Consult Note (Signed)
 "                                                  Palliative Care Consult Note                                  Date: 11/05/2024   Patient Name: Wayne Molina  DOB: 10/23/1956  MRN: [id number]  Age / Sex: 68 y.o., male  PCP: Wayne Ferrier, DO Referring Physician: [name], MD  Reason for Consultation: Establishing goals of care  HPI/Patient Profile: 68 y.o. 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  He was also found to be in A-fib with RVR.  MRI showed continued increase in peripheral enhancement of known glioblastoma with increased surrounding FLAIR hyperintense signal, which could represent tumor progression and/or posttreatment change.  Palliative Medicine has been consulted for goals of care discussions and complex medical decision making.   Clinical Assessment and Goals of Care:   Extensive chart review has been completed including labs, vital signs, imaging, progress/consult notes, orders, medications and available advance directive documents.    I met with patient and his wife Wayne Molina to discuss diagnosis, prognosis, GOC, disposition, and options. Patient is drowsy, so conversation is mostly with Wayne Molina. She reports he has been intermittently confused and with memory impairment.   Patient is known to PMT, both from prior hospitalizations and also from outpatient palliative clinic at [facility] (with Wayne Pickenpack-Cousar NP). I re-introduced Palliative Medicine as specialized medical care for people living with serious illness. It focuses on providing relief from the symptoms and stress of a serious illness.   Created space and opportunity for wife to express thoughts and feelings regarding current medical situation. Values and goals of care were attempted to be elicited.  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 He and Wayne Molina were married earlier this year, but have been together for 10 years. Wayne Molina has 1 living daughter from a previous marriage.   Discussion: We discussed patient's current medical situation and what it means in the larger context of his/her ongoing co-morbidities. Current clinical status was reviewed. Reviewed MRI results and that patient's weakness is likely due to tumor progression.  Reviewed natural trajectory of advanced cancer as progressive and noncurable.   Wayne Molina reports that Wayne [facility]ina has been doing well until recently. Due to his recent decline in both cognitive and functional status, Wayne Molina is concerned she cannot care for him at home in his current condition. She expresses interest in CIR. She is hopeful for improvement in functional status so that he can continue cancer-directed treatment. She is also eager to hear recommendations from Dr. [name].   We discussed code status. Wayne Molina confirms that [facility] previously agreed to DNR/DNI status. On this admission, he expressed desire to be full code. However, she does not think he understood this conversation. I explained that if Wayne Molina's cognitive status does not improve, as his spouse and HCPOA she can make the decision for DNR status as consistent with his previously stated wishes.  Wayne Molina verbalizes understanding, and plans to  speak to patient's daughter to make sure she agrees with changing code status back to DNR.   Discussed the importance of continued conversation with the medical team regarding overall plan of care and treatment options. Questions and concerns addressed. Emotional support provided.   Review of Systems  Unable to perform ROS   Objective:   Primary Diagnoses: Present on Admission:  Atrial fibrillation with RVR (HCC)  Glioblastoma, IDH-wildtype (HCC)   Physical Exam Vitals reviewed.  Constitutional:      General:  He is not in acute distress.    Appearance: He is ill-appearing.  Pulmonary:     Effort: Pulmonary effort is normal.  Neurological:     Mental Status: He is lethargic and confused.     Comments: Oriented to person, place  Psychiatric:        Cognition and Memory: Memory is impaired.     Palliative Assessment/Data: PPS 40%     Assessment & Plan:   SUMMARY OF RECOMMENDATIONS   Full code for now, with ongoing discussion Continue full scope care Goal of care is rehab to allow opportunity for improvement of functional status PMT will continue to follow  Primary Decision Maker: HCPOA - wife/Wayne Molina  Existing Vynca/ACP Documentation: HCPOA document and living will  Prognosis:  Unable to determine  Discharge Planning:  To Be Determined    Thank you for allowing us  to participate in the care of Wayne Molina   Time Total: 76 minutes  Detailed review of medical records (labs, imaging, vital signs), medically appropriate exam, discussed with treatment team, counseling and education to patient, family, & staff, documenting clinical information, coordination of care.   Signed by: Wayne Loll, NP Palliative Medicine Team  Team Phone # [phone]  For individual providers, please see AMION                "

## 2024-11-05 NOTE — Progress Notes (Signed)
 Inpatient Rehab Admissions:  Inpatient Rehab Consult received.  I met with patient and wife [name] at the bedside for rehabilitation assessment and to discuss goals and expectations of an inpatient rehab admission.  Discussed average length of stay, insurance authorization requirement and discharge home after completion of CIR. [name] acknowledged understanding. Pt would like to pursue CIR and [name] supportive. [name] confirmed that she will be able to provide 24/7 support for pt after discharge. Will continue to follow.  Signed: Tinnie Yvone Cohens, MS, CCC-SLP Admissions Coordinator [phone]

## 2024-11-05 NOTE — Assessment & Plan Note (Addendum)
 Improving with Nystatin  topical powder.  - Nystatin  topical powder twice daily

## 2024-11-05 NOTE — Plan of Care (Signed)

## 2024-11-05 NOTE — PMR Pre-admission (Signed)
 PMR Admission Coordinator Pre-Admission Assessment  Patient: Wayne Molina is an 68 y.o., male MRN: [id number] DOB: 22-Jan-1956 Height: 5' 8 (172.7 cm) Weight: 72.6 kg  Insurance Information HMO: ***    PPO: ***     PCP:      IPA:      80/20:      OTHER:  PRIMARY: [facility]      Policy#: [id number]      Subscriber: patient CM Name: ***      Phone#: [phone]     Fax#: [phone] Pre-Cert#: [id number] approved on 11/08/24 for rehab for 7 days      Employer: Retired Benefits:  Phone #: [phone]     Name: checked on [email or web address] and verified Eff. Date: 11/10/23     Deduct: $0      Out of Pocket Max: [phone] (met [phone])      Life Max: n/a CIR: $395 copay for days 1-5      SNF: $0 for days 1-20; $214 for days 21-60; $0 for days 61-100 Outpatient:      Co-Pay: $45/visit Home Health: 100%      Co-Pay: none DME: 80%     Co-Pay: 20% Providers: in-network  SECONDARY: Tricare for Life      Policy#: [id number]     Phone#: [phone]  Financial Counselor:       Phone#:   The Data Collection Information Summary for patients in Inpatient Rehabilitation Facilities with attached Privacy Act Statement-Health Care Records was provided and verbally reviewed with: Family  Emergency Contact Information Contact Information     Name Relation Home Work Mobile   [place] Spouse   [phone]   Yaworski,Rodney Brother [phone]     Lazare,Angelina Daughter [phone]  [phone]      Other Contacts   None on File     Current Medical History  Patient Admitting Diagnosis: debility History of Present Illness: Pt is a 68 year old male with medical hx significant for: brain CA/glioblastoma, HTN, hyperlipidemia, chronic A-fib. Pt presented to [facility] on 11/03/24 d/t generalized weakness x1 week. Pt also with decreased PO intake. Pt fell on day of presentation. CT showed slightly increased size of right parietal mass with increased involvement of right splenium of corpus  callosum. Increased vasogenic edema in right parietal lobe. Given Decadron . MRI showed either progression of disease or post-treatment effect. Oncology consulted. Palliative Medicine consulted. *** Therapy evaluations completed and CIR recommended d/t pt's deficits with functional mobility.     Patient's medical record from [facility] has been reviewed by the rehabilitation admission coordinator and physician.  Past Medical History  Past Medical History:  Diagnosis Date   Anxiety    Arthritis    fingers   Chronic atrial fibrillation (HCC)    Depression    Family history of breast cancer    Family history of cancer of male genital organ    Family history of gene mutation    BRIP1   Family history of malignant neoplasm of gastrointestinal tract    History of radiation therapy    08-23-24 - 09-12-24 Brain (parietal lobe) Dr. Lauraine Golden, MD   Hyperlipidemia    Hypertension    Personal history of colonic polyps 11/12/2006   Sleep apnea     Has the patient had major surgery during 100 days prior to admission? No  Family History   family history includes Breast cancer (age of onset: 26) in his niece; Cancer in his paternal aunt and paternal great-grandmother;  Cancer (age of onset: 64) in his cousin; Cancer (age of onset: 71) in his niece; Colon cancer in his maternal uncle; Colon cancer (age of onset: 26) in his nephew; Dementia in his mother; Diabetes in his father; Heart disease in his father; Kidney disease in his brother; Lung cancer in his paternal aunt; Other in his niece and sister.  Current Medications Current Medications[1]  Patients Current Diet:  Diet Order             Diet regular Room service appropriate? Yes; Fluid consistency: Thin  Diet effective now                   Precautions / Restrictions Precautions Precautions: Fall Restrictions Weight Bearing Restrictions Per Provider Order: No   Has the patient had 2 or more falls or a fall with  injury in the past year? Yes  Prior Activity Level Limited Community (1-2x/wk): gets out of house ~2 days/week  Prior Functional Level Self Care: Did the patient need help bathing, dressing, using the toilet or eating? Needed some help  Indoor Mobility: Did the patient need assistance with walking from room to room (with or without device)? Independent  Stairs: Did the patient need assistance with internal or external stairs (with or without device)? Unknown (avoids stairs)  Functional Cognition: Did the patient need help planning regular tasks such as shopping or remembering to take medications? Dependent  Patient Information Are you of Hispanic, Latino/a,or Spanish origin?: X. Patient unable to respond, A. No, not of Hispanic, Latino/a, or Spanish origin What is your race?: X. Patient unable to respond, A. White Do you need or want an interpreter to communicate with a doctor or health care staff?: 9. Unable to respond Patient information obtained via proxy : wife answered questions (Pt does not need an interpreter)  Patient's Response To:  Health Literacy and Transportation Is the patient able to respond to health literacy and transportation needs?: No Health Literacy - How often do you need to have someone help you when you read instructions, pamphlets, or other written material from your doctor or pharmacy?: Patient unable to respond Health Literacy and Transportation obtained via proxy: wife answered questions (Pt has not missed any appointments d/t transportation issues)  Journalist, Newspaper / Equipment Home Equipment: Shower seat, Agricultural Consultant (2 wheels), Wheelchair - manual, BSC/3in1  Prior Device Use: Indicate devices/aids used by the patient prior to current illness, exacerbation or injury? Walker  Current Functional Level Cognition  Orientation Level: Oriented to person, Oriented to place, Oriented to situation    Extremity Assessment (includes  Sensation/Coordination)  Upper Extremity Assessment: Generalized weakness  Lower Extremity Assessment: Generalized weakness    ADLs  Overall ADL's : Needs assistance/impaired Eating/Feeding: Set up Grooming: Wash/dry hands, Wash/dry face, Oral care, Brushing hair, Contact guard assist, Standing Upper Body Bathing: Minimal assistance, Standing Lower Body Bathing: Moderate assistance, Sit to/from stand Upper Body Dressing : Moderate assistance Lower Body Dressing: Moderate assistance Toilet Transfer: Moderate assistance, +2 for physical assistance, Ambulation, Rolling walker (2 wheels) Toileting- Clothing Manipulation and Hygiene: Moderate assistance Functional mobility during ADLs: Moderate assistance, Rolling walker (2 wheels), +2 for physical assistance General ADL Comments: occasional cues for sequencing    Mobility  Overal bed mobility: Needs Assistance Bed Mobility: Rolling, Sidelying to Sit Rolling: Supervision Sidelying to sit: Contact guard assist, HOB elevated, Used rails Supine to sit: Contact guard Sit to supine: Contact guard assist General bed mobility comments: increased time and verbal cues  Transfers  Overall transfer level: Needs assistance Equipment used: Rolling walker (2 wheels) Transfers: Sit to/from Stand Sit to Stand: Min assist General transfer comment: cues for hand placement and min assist to power up and occasional cues for walker management and keeping close distance with RW    Ambulation / Gait / Stairs / Wheelchair Mobility  Ambulation/Gait Ambulation/Gait assistance: +2 safety/equipment, Min assist Gait Distance (Feet): 25 Feet (seated rest, 25) Assistive device: Rolling walker (2 wheels) Gait Pattern/deviations: Trunk flexed, Wide base of support, Shuffle, Decreased stride length, Step-through pattern General Gait Details: constant cues and assist for proximity to RW, upright posture, and incr step length. Chair follow for safety Gait velocity:  decreased Pre-gait activities: see above in transfers; no +2 for chair follow    Posture / Balance Dynamic Sitting Balance Sitting balance - Comments: EOB Balance Overall balance assessment: Needs assistance Sitting-balance support: No upper extremity supported, Feet supported Sitting balance-Leahy Scale: Fair Sitting balance - Comments: EOB Standing balance support: Single extremity supported, Bilateral upper extremity supported, During functional activity Standing balance-Leahy Scale: Poor Standing balance comment: one extremity support when standing at sink for self care    Special considerations/life events  Skin Abrasion: buttocks, knee; Rash: groin/left   Previous Home Environment (from acute therapy documentation) Living Arrangements: Spouse/significant other  Lives With: Spouse Available Help at Discharge: Family, Available 24 hours/day Type of Home: House Home Layout: One level Home Access: Ramped entrance Entrance Stairs-Rails: None Entrance Stairs-Number of Steps: 1 threshold step Bathroom Shower/Tub: Engineer, Manufacturing Systems: Handicapped height Bathroom Accessibility: Yes How Accessible: Accessible via walker Home Care Services: No  Discharge Living Setting Plans for Discharge Living Setting: Patient's home Type of Home at Discharge: House Discharge Home Layout: One level Discharge Home Access: Ramped entrance Discharge Bathroom Shower/Tub: Tub/shower unit Discharge Bathroom Toilet: Handicapped height Discharge Bathroom Accessibility: Yes How Accessible: Accessible via walker Does the patient have any problems obtaining your medications?: No  Social/Family/Support Systems Anticipated Caregiver: [name] (wife) Anticipated Caregiver's Contact Information: [phone] Caregiver Availability: 24/7 Discharge Plan Discussed with Primary Caregiver: Yes Is Caregiver In Agreement with Plan?: Yes  Goals Patient/Family Goal for Rehab: *** Expected length of  stay: *** Pt/Family Agrees to Admission and willing to participate: Yes Program Orientation Provided & Reviewed with Pt/Caregiver Including Roles  & Responsibilities: Yes  Decrease burden of Care through IP rehab admission: NA  Possible need for SNF placement upon discharge: Not anticipated  Patient Condition: {PATIENT'S CONDITION:22832}  Preadmission Screen Completed By:  Tinnie SHAUNNA Yvone Delayne, 11/08/2024 2:31 PM ______________________________________________________________________   Discussed status with Dr. PIERRETTE on *** at *** and received approval for admission today.  Admission Coordinator:  Tinnie SHAUNNA Yvone Delayne, CCC-SLP, time ***/Date ***   Assessment/Plan: Diagnosis: *** Does the need for close, 24 hr/day Medical supervision in concert with the patient's rehab needs make it unreasonable for this patient to be served in a less intensive setting? {yes_no_potentially:3041433} Co-Morbidities requiring supervision/potential complications: *** Due to {due un:6958565}, does the patient require 24 hr/day rehab nursing? {yes_no_potentially:3041433} Does the patient require coordinated care of a physician, rehab nurse, PT, OT, and SLP to address physical and functional deficits in the context of the above medical diagnosis(es)? {yes_no_potentially:3041433} Addressing deficits in the following areas: {deficits:3041436} Can the patient actively participate in an intensive therapy program of at least 3 hrs of therapy 5 days a week? {yes_no_potentially:3041433} The potential for patient to make measurable gains while on inpatient rehab is {potential:3041437} Anticipated functional outcomes upon discharge from inpatient rehab: {  functional outcomes:304600100} PT, {functional outcomes:304600100} OT, {functional outcomes:304600100} SLP Estimated rehab length of stay to reach the above functional goals is: *** Anticipated discharge destination: {anticipated dc setting:21604} 10. Overall  Rehab/Functional Prognosis: {potential:3041437}   MD Signature: ***     [1]  Current Facility-Administered Medications:    acetaminophen  (TYLENOL ) tablet 500 mg, 500 mg, Oral, Q6H, Baker, Amelia G, DO, 500 mg at 11/08/24 9472   apixaban  (ELIQUIS ) tablet 5 mg, 5 mg, Oral, BID, McIntyre, Brittany J, MD, 5 mg at 11/08/24 9091   atorvastatin  (LIPITOR) tablet 40 mg, 40 mg, Oral, Daily, [name], MD, 40 mg at 11/08/24 0908   calcium  carbonate (OS-CAL - dosed in mg of elemental calcium ) tablet 1,250 mg, 1,250 mg, Oral, BID WC, [name], MD, 1,250 mg at 11/08/24 9092   carvedilol  (COREG ) tablet 12.5 mg, 12.5 mg, Oral, BID, Zheng, Jacky, MD, 12.5 mg at 11/08/24 0907   dexamethasone  (DECADRON ) injection 4 mg, 4 mg, Intravenous, BID, Baloch, Mahnoor, MD, 4 mg at 11/08/24 0915   levETIRAcetam  (KEPPRA ) tablet 1,000 mg, 1,000 mg, Oral, BID, McIntyre, Brittany J, MD, 1,000 mg at 11/08/24 9092   melatonin tablet 3 mg, 3 mg, Oral, QHS, Zheng, Jacky, MD, 3 mg at 11/07/24 2147   nystatin  (MYCOSTATIN /NYSTOP ) topical powder, , Topical, BID, Baker, Raguel MATSU, DO, Given at 11/08/24 9089   oxyCODONE  (Oxy IR/ROXICODONE ) immediate release tablet 5 mg, 5 mg, Oral, Q6H PRN, [name], MD, 5 mg at 11/08/24 0915   pantoprazole  (PROTONIX ) EC tablet 40 mg, 40 mg, Oral, Daily, [name], MD, 40 mg at 11/08/24 0908   polyethylene glycol (MIRALAX  / GLYCOLAX ) packet 17 g, 17 g, Oral, Daily, [name], MD, 17 g at 11/06/24 9052   QUEtiapine  (SEROQUEL ) tablet 25 mg, 25 mg, Oral, QHS, Zheng, Jacky, MD, 25 mg at 11/07/24 2147   sodium chloride  flush (NS) 0.9 % injection 3 mL, 3 mL, Intravenous, Q12H, [name], MD, 3 mL at 11/08/24 [phone]

## 2024-11-05 NOTE — Hospital Course (Addendum)
 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 His hospital course is detailed below:  Generalized weakness I Fall  Postradiation inflammation and treatment effect  Patient stable on admission without altered mental status. Afebrile without focal deficits or lab abnormalities. CT head with progressive worsening of previous mass and increased vasogenic edema. Patient's steroids were recently tapered off, making symptoms likely due to vasogenic edema secondary to glioblastoma. He received decadron  10 mg, hydralazine , and keppra  1000 mg in the ED. Dr. [name], patient's neuro oncologist recommended IV decadron  4 mg q 6 hours and then po 4 mg decadron  BID upon discharge. He also mentioned that CT findings may very well be consistent with typical post radiation changes. Currently no need to consult neurosurgery. Home Keppra  1000 mg BID was continued. Chemotherapy (Temozolomide  280 mg) was held during admission.   Afib with RVR  Patient had Afib with rates up to 130. He missed one day of his home medications, likely the cause. Home eliquis  5 mg BID and carvedilol  12.5 mg BID were continued and heart rate improved.   Hyperbilirubinemia  Elevated up to 2.1. Resolved spontaneously without further intervention or work up.  Other chronic conditions were medically managed with home medications and formulary alternatives as necessary   PCP Follow-up Recommendations: Chemotherapy regimen after discharge  Consider outpatient follow up with one time hematuria noted during admission

## 2024-11-05 NOTE — Assessment & Plan Note (Addendum)
 HTN - continue home Carvedilol  12.5 mg BID HLD - continue home Atorvastatin  40 mg daily Anxiety/depression - continue home Quetiapine  20 mg daily GERD - continue home Pantoprazole  40 mg daily

## 2024-11-05 NOTE — Assessment & Plan Note (Addendum)
 Afebrile. Overall stable, weakness most likely secondary to known glioblastoma. Total bilirubin downtrending 2.1 --> 1.5. Patient awaiting formal CIR evaluation.  - Rest of plan as below

## 2024-11-06 ENCOUNTER — Other Ambulatory Visit: Payer: Self-pay

## 2024-11-06 ENCOUNTER — Inpatient Hospital Stay: Admitting: Nurse Practitioner

## 2024-11-06 ENCOUNTER — Inpatient Hospital Stay: Admitting: Internal Medicine

## 2024-11-06 ENCOUNTER — Encounter: Payer: Self-pay | Admitting: Internal Medicine

## 2024-11-06 ENCOUNTER — Other Ambulatory Visit (HOSPITAL_COMMUNITY): Payer: Self-pay

## 2024-11-06 ENCOUNTER — Inpatient Hospital Stay

## 2024-11-06 DIAGNOSIS — C719 Malignant neoplasm of brain, unspecified: Secondary | ICD-10-CM

## 2024-11-06 DIAGNOSIS — R531 Weakness: Secondary | ICD-10-CM | POA: Diagnosis not present

## 2024-11-06 NOTE — Assessment & Plan Note (Addendum)
 Reports strength improved vs yesterday. Still struggling with getting out of bed. Likely dispo is CIR.  - PT eval and treat

## 2024-11-06 NOTE — Assessment & Plan Note (Addendum)
 HTN - continue home Carvedilol  12.5 mg BID HLD - continue home Atorvastatin  40 mg daily Anxiety/depression - continue home Quetiapine  25 mg daily GERD - continue home Pantoprazole  40 mg daily

## 2024-11-06 NOTE — Plan of Care (Signed)
 In bed .[name] Wifebat bedside. Voiced no complaints    Problem: Education: Goal: Knowledge of General Education information will improve Description: Including pain rating scale, medication(s)/side effects and non-pharmacologic comfort measures Outcome: Progressing   Problem: Activity: Goal: Risk for activity intolerance will decrease Outcome: Progressing   Problem: Nutrition: Goal: Adequate nutrition will be maintained Outcome: Progressing   Problem: Pain Managment: Goal: General experience of comfort will improve and/or be controlled Outcome: Progressing   Problem: Safety: Goal: Ability to remain free from injury will improve Outcome: Progressing   Problem: Skin Integrity: Goal: Risk for impaired skin integrity will decrease Outcome: Progressing

## 2024-11-06 NOTE — Progress Notes (Signed)
 "    Daily Progress Note Intern Pager: [phone]  Patient name: Wayne Molina Medical record number: [id number] Date of birth: Dec 29, 1955 Age: 68 y.o. Gender: male  Primary Care Provider: Janna Ferrier, DO Consultants: Heme/Onc Code Status: Full  Pt Overview and Major Events to Date:  12/26 - admitted  Medical Decision Making:  68 yo male w/ hx of glioblastoma w/ hx of seizures due to the same, Afib on Eliquis , HTN, HLD presenting due to increased weakness. He had previously tapered off of his home steroids. Restarted IV decadron  on admission, consider transition to PO moving forward. Holding chemotherapy agent while inpatient. Oncology to see today. PT to eval today, he has good ROM but strength reduced and trouble with normal ambulation w/ RW.   Assessment & Plan Generalized weakness Fall Reports strength improved vs yesterday. Still struggling with getting out of bed. Likely dispo is CIR.  - PT eval and treat Glioblastoma, IDH-wildtype (HCC) Vasogenic edema (HCC) Oncologist Dr [name] to see pt this afternoon. - Neuro-oncology consulted, appreciate recommendations  - No chemo inpatient - Decadron  4mg  IV q6h. Consider transition to PO.  - Continue home Keppra  1000mg  BID for seizures 2/2 to brain mass  - Pain: Tylenol  500 mg q6hrs, Oxycodone  5 mg q6hrs PRN - Palliative care consulted Intertrigo Improving with Nystatin  topical powder.  - Nystatin  topical powder twice daily Chronic health problem HTN - continue home Carvedilol  12.5 mg BID HLD - continue home Atorvastatin  40 mg daily Anxiety/depression - continue home Quetiapine  25 mg daily GERD - continue home Pantoprazole  40 mg daily   FEN/GI: Regular PPx: Home Eliquis  Dispo:CIR in 2-3 days. Barriers include auth.   Subjective:  NAEON. Pt and wife describe his episode of weakness prompting admission. He reports feeling much better relative to that day, and marginally stronger than yesterday. Still having trouble with  getting out of the bed. Looking forward to working with PT today. No significant pain to report this AM.   Objective: Temp:  [97.6 F (36.4 C)-98.4 F (36.9 C)] 98.4 F (36.9 C) (12/29 0454) Pulse Rate:  [75-95] 75 (12/29 0454) Resp:  [18] 18 (12/29 0454) BP: (104-138)/(58-92) 116/65 (12/29 0454) SpO2:  [96 %-100 %] 98 % (12/29 0454) Physical Exam: General: Awake, alert, NAD. Communicates clearly. HEENT: Persistent ptosis of R eyelid. EOMI.  Cardio: Irregular rhythm. Normal S1, S2. No murmur, rub, gallop appreciated. 2+ radial and dorsalis pedis pulses b/l w/ good capillary refill. Resp: CTA bilaterally. No wheezes, rales, or rhonchi. Normal work of breathing on room air Abdomen: soft, non-tender, non-distended.  Extremities: Full ROM w/ no TTP or obvious deformity. 4/5 strength in b/l UE and LE.  Neuro: AOx4. No acute focal deficits.   Laboratory: Most recent CBC Lab Results  Component Value Date   WBC 4.3 11/04/2024   HGB 13.6 11/04/2024   HCT 39.6 11/04/2024   MCV 95.2 11/04/2024   PLT 213 11/04/2024   Most recent BMP    Latest Ref Rng & Units 11/05/2024    6:50 AM  BMP  Glucose 70 - 99 mg/dL 830   BUN 8 - 23 mg/dL 19   Creatinine 9.38 - 1.24 mg/dL 9.28   Sodium 864 - 854 mmol/L 137   Potassium 3.5 - 5.1 mmol/L 4.1   Chloride 98 - 111 mmol/L 103   CO2 22 - 32 mmol/L 23   Calcium  8.9 - 10.3 mg/dL 9.1    None  Imaging/Diagnostic Tests: None   Manon Jester, DO 11/06/2024, 7:41 AM  PGY-1, [facility] Family Medicine FPTS Intern pager: [phone], text pages welcome Secure chat group [facility] Teaching Service   "

## 2024-11-06 NOTE — Assessment & Plan Note (Addendum)
 Oncologist Dr [name] to see pt this afternoon. - Neuro-oncology consulted, appreciate recommendations  - No chemo inpatient - Decadron  4mg  IV q6h. Consider transition to PO.  - Continue home Keppra  1000mg  BID for seizures 2/2 to brain mass  - Pain: Tylenol  500 mg q6hrs, Oxycodone  5 mg q6hrs PRN - Palliative care consulted

## 2024-11-06 NOTE — Care Management Important Message (Signed)
 Important Message  Patient Details  Name: Wayne Molina MRN: [id number] Date of Birth: Apr 03, 1956   Important Message Given:  Yes - Medicare and Tricare IM     Claretta Deed 11/06/2024, 2:47 PM

## 2024-11-06 NOTE — Consult Note (Signed)
 [facility] Neuro-Oncology Consult Note  Patient Care Team: Janna Ferrier, DO as PCP - General (Family Medicine) Croitoru, Jerel, MD as PCP - Cardiology (Cardiology) Croitoru, Jerel, MD as Consulting Physician (Cardiology) Tobie Franky SQUIBB, DPM as Consulting Physician (Podiatry) Pickenpack-Cousar, Fannie SAILOR, NP as Nurse Practitioner (Hospice and Palliative Medicine) Silvano Valrie SQUIBB, RN as Registered Nurse  CHIEF COMPLAINTS/PURPOSE OF CONSULTATION:  Glioblastoma  HISTORY OF PRESENTING ILLNESS:  Wayne Molina 68 y.o. male presented with worsening left sided weakness over a period of several days.  He began having difficulty walking, using his left arm.  In the hospital, MRI was obtained and he was started back on higher dose of decadron .  Since admission, his strength has clearly improved.  He has done some walking with PT in past couple of days.  MEDICAL HISTORY:  Past Medical History:  Diagnosis Date   Anxiety    Arthritis    fingers   Chronic atrial fibrillation (HCC)    Depression    Family history of breast cancer    Family history of cancer of male genital organ    Family history of gene mutation    BRIP1   Family history of malignant neoplasm of gastrointestinal tract    History of radiation therapy    08-23-24 - 09-12-24 Brain (parietal lobe) Dr. Lauraine Golden, MD   Hyperlipidemia    Hypertension    Personal history of colonic polyps 11/12/2006   Sleep apnea     SURGICAL HISTORY: Past Surgical History:  Procedure Laterality Date   2-D echocardiogram  07/22/2010   Ejection fraction greater than 55%. Left atrium moderately dilated. Right atrium moderately dilated. 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 Mild to Moderate MR. Mild to moderate TR.   APPLICATION OF CRANIAL NAVIGATION Right 07/17/2024   Procedure: COMPUTER-ASSISTED NAVIGATION, FOR CRANIAL PROCEDURE;  Surgeon: Rosslyn Dino HERO, MD;  Location: [facility] OR;  Service: Neurosurgery;  Laterality: Right;  CRANIAL  NAVIGATION   ATRIAL ABLATION SURGERY  11/09/2002   EP IMPLANTABLE DEVICE N/A 02/11/2016   Procedure: Loop Recorder Insertion;  Surgeon: Jerel Balding, MD;  Location: [facility] INVASIVE CV LAB;  Service: Cardiovascular;  Laterality: N/A;   Exercise Myoview stress test  07/08/2000   Nonischemic low-risk.   HIP FRACTURE SURGERY  1970s   IR GENERIC HISTORICAL  01/21/2017   IR RADIOLOGIST EVAL & MGMT 01/21/2017 [facility]-INTERV RAD   STERIOTACTIC STIMULATOR INSERTION Right 07/17/2024   Procedure: OPEN CRANIOTOMY FOR BIOPSY;  Surgeon: Rosslyn Dino HERO, MD;  Location: [facility] OR;  Service: Neurosurgery;  Laterality: Right;  RIGHT STEREOTACTIC BRAIN BIOPSY    SOCIAL HISTORY: Social History   Socioeconomic History   Marital status: Divorced    Spouse name: Not on file   Number of children: 3   Years of education: 12   Highest education level: GED or equivalent  Occupational History   Not on file  Tobacco Use   Smoking status: Former    Current packs/day: 1.00    Types: Cigarettes   Smokeless tobacco: Never   Tobacco comments:    Smoked for approximately 6 months in his life  Vaping Use   Vaping status: Never Used  Substance and Sexual Activity   Alcohol use: No   Drug use: No   Sexual activity: Not Currently  Other Topics Concern   Not on file  Social History Narrative   Patient lives alone in [place] in a one story home. There are no steps.    Patient enjoys going to the  gym, watching TV, and going to antique shops.    Patient is divorced with 3 children, two of which have passed; does have a girlfriend.    Patient has his own car and is able to drive.    Social Drivers of Health   Tobacco Use: Medium Risk (11/03/2024)   Patient History    Smoking Tobacco Use: Former    Smokeless Tobacco Use: Never    Passive Exposure: Not on file  Financial Resource Strain: Low Risk (03/22/2024)   Overall Financial Resource Strain (CARDIA)    Difficulty of Paying Living Expenses: Not hard at all  Food  Insecurity: No Food Insecurity (11/03/2024)   Epic    Worried About Programme Researcher, Broadcasting/film/video in the Last Year: Never true    Ran Out of Food in the Last Year: Never true  Transportation Needs: No Transportation Needs (11/03/2024)   Epic    Lack of Transportation (Medical): No    Lack of Transportation (Non-Medical): No  Recent Concern: Transportation Needs - Unmet Transportation Needs (09/18/2024)   Epic    Lack of Transportation (Medical): Yes    Lack of Transportation (Non-Medical): Yes  Physical Activity: Sufficiently Active (09/18/2024)   Exercise Vital Sign    Days of Exercise per Week: 5 days    Minutes of Exercise per Session: 60 min  Stress: No Stress Concern Present (09/18/2024)   [employer] of Occupational Health - Occupational Stress Questionnaire    Feeling of Stress: Only a little  Social Connections: Moderately Integrated (11/03/2024)   Social Connection and Isolation Panel    Frequency of Communication with Friends and Family: More than three times a week    Frequency of Social Gatherings with Friends and Family: More than three times a week    Attends Religious Services: More than 4 times per year    Active Member of Clubs or Organizations: No    Attends Banker Meetings: Never    Marital Status: Married  Catering Manager Violence: Not At Risk (11/03/2024)   Epic    Fear of Current or Ex-Partner: No    Emotionally Abused: No    Physically Abused: No    Sexually Abused: No  Depression (PHQ2-9): Low Risk (10/09/2024)   Depression (PHQ2-9)    PHQ-2 Score: 0  Recent Concern: Depression (PHQ2-9) - High Risk (09/18/2024)   Depression (PHQ2-9)    PHQ-2 Score: 12  Alcohol Screen: Low Risk (04/16/2023)   Alcohol Screen    Last Alcohol Screening Score (AUDIT): 0  Housing: Low Risk (11/03/2024)   Epic    Unable to Pay for Housing in the Last Year: No    Number of Times Moved in the Last Year: 0    Homeless in the Last Year: No  Utilities: Not At Risk  (11/03/2024)   Epic    Threatened with loss of utilities: No  Health Literacy: Adequate Health Literacy (09/18/2024)   B1300 Health Literacy    Frequency of need for help with medical instructions: Rarely    FAMILY HISTORY: Family History  Problem Relation Age of Onset   Dementia Mother    Heart disease Father    Diabetes Father    Other Sister        BRIP1 gene mutation   Kidney disease Brother    Colon cancer Maternal Uncle    Lung cancer Paternal Aunt        d. >50   Cancer Paternal Aunt  unknown type, d. >50   Cancer Cousin 17       gynecologic (paternal first cousin)   Colon cancer Nephew 27       arising in colon polyp   Cancer Niece 84       gynecologic; niece in her 26's   Breast cancer Niece 43   Other Niece        BRIP1 gene mutation   Cancer Paternal Great-grandmother        abdominal cancer (PGF's mother) great grandmother    ALLERGIES:  has no known allergies.  MEDICATIONS:  Current Facility-Administered Medications  Medication Dose Route Frequency Provider Last Rate Last Admin   acetaminophen  (TYLENOL ) tablet 500 mg  500 mg Oral Q6H Baker, Amelia G, DO   500 mg at 11/06/24 1239   apixaban  (ELIQUIS ) tablet 5 mg  5 mg Oral BID McIntyre, Brittany J, MD   5 mg at 11/06/24 9052   atorvastatin  (LIPITOR) tablet 40 mg  40 mg Oral Daily McIntyre, Brittany J, MD   40 mg at 11/06/24 0947   calcium  carbonate (OS-CAL - dosed in mg of elemental calcium ) tablet 1,250 mg  1,250 mg Oral BID WC [name], MD   1,250 mg at 11/06/24 9178   carvedilol  (COREG ) tablet 12.5 mg  12.5 mg Oral BID Zheng, Jacky, MD   12.5 mg at 11/06/24 0947   dexamethasone  (DECADRON ) injection 4 mg  4 mg Intravenous Q6H Romelle Booty, MD   4 mg at 11/06/24 1245   guaiFENesin -dextromethorphan  (ROBITUSSIN DM) 100-10 MG/5ML syrup 5 mL  5 mL Oral Q4H PRN [name], MD   5 mL at 11/04/24 2357   levETIRAcetam  (KEPPRA ) tablet 1,000 mg  1,000 mg Oral BID McIntyre, Brittany J, MD    1,000 mg at 11/06/24 0947   melatonin tablet 3 mg  3 mg Oral QHS Zheng, Jacky, MD   3 mg at 11/05/24 2300   nystatin  (MYCOSTATIN /NYSTOP ) topical powder   Topical BID Baker, Amelia G, DO   Given at 11/06/24 9048   oxyCODONE  (Oxy IR/ROXICODONE ) immediate release tablet 5 mg  5 mg Oral Q6H PRN McIntyre, Brittany J, MD   5 mg at 11/05/24 2307   pantoprazole  (PROTONIX ) EC tablet 40 mg  40 mg Oral Daily McIntyre, Brittany J, MD   40 mg at 11/06/24 9052   polyethylene glycol (MIRALAX  / GLYCOLAX ) packet 17 g  17 g Oral Daily [name], MD   17 g at 11/06/24 9052   QUEtiapine  (SEROQUEL ) tablet 25 mg  25 mg Oral QHS Zheng, Jacky, MD   25 mg at 11/05/24 2300   sodium chloride  flush (NS) 0.9 % injection 3 mL  3 mL Intravenous Q12H [name], MD   3 mL at 11/06/24 9047    REVIEW OF SYSTEMS:   Constitutional: Denies fevers, chills or abnormal weight loss Eyes: Denies blurriness of vision Ears, nose, mouth, throat, and face: Denies mucositis or sore throat Respiratory: Denies cough, dyspnea or wheezes Cardiovascular: Denies palpitation, chest discomfort or lower extremity swelling Gastrointestinal:  Denies nausea, constipation, diarrhea GU: Denies dysuria or incontinence Skin: Denies abnormal skin rashes Neurological: Per HPI Musculoskeletal: Denies joint pain, back or neck discomfort. No decrease in ROM Behavioral/Psych: Denies anxiety, disturbance in thought content, and mood instability   PHYSICAL EXAMINATION: Vitals:   11/06/24 0747 11/06/24 1212  BP: 129/77 130/82  Pulse: 98 75  Resp: 17 18  Temp: 98 F (36.7 C) 97.7 F (36.5 C)  SpO2:  99% 97%   KPS: 70. General: Alert, cooperative, pleasant, in no acute distress Head: Normal EENT: No conjunctival injection or scleral icterus.  Lungs: Resp effort normal Cardiac: Regular rate Abdomen: Non-distended abdomen Skin: No rashes cyanosis or petechiae. Extremities: pitting edema b/l LE   Neurologic Exam: Mental  Status: Awake, alert, attentive to examiner. Oriented to self and environment. Language is fluent with intact comprehension.  Some psychomotor slowing noted. Cranial Nerves: Visual acuity is grossly normal. Visual fields are full. Extra-ocular movements intact. No ptosis. Face is symmetric Motor: Tone and bulk are normal. Power is 4+/5 in left arm and leg. Reflexes are symmetric, no pathologic reflexes present.  Sensory: Intact to light touch Gait: Deferred   LABORATORY DATA:  I have reviewed the data as listed Lab Results  Component Value Date   WBC 4.3 11/04/2024   HGB 13.6 11/04/2024   HCT 39.6 11/04/2024   MCV 95.2 11/04/2024   PLT 213 11/04/2024   Recent Labs    11/03/24 1325 11/04/24 0134 11/05/24 0650  NA 139 137 137  K 4.3 3.8 4.1  CL 106 104 103  CO2 22 21* 23  GLUCOSE 94 158* 169*  BUN 18 16 19   CREATININE 0.77 0.69 0.71  CALCIUM  8.6* 8.8* 9.1  GFRNONAA >60 >60 >60  PROT 5.9* 6.4* 6.1*  ALBUMIN 3.4* 3.7 3.7  AST 32 25 22  ALT 25 26 23   ALKPHOS 44 46 41  BILITOT 1.8* 2.1* 1.5*    RADIOGRAPHIC STUDIES: I have personally reviewed the radiological images as listed and agreed with the findings in the report. MR BRAIN W WO CONTRAST Result Date: 11/03/2024 EXAM: MRI BRAIN WITH CONTRAST 11/03/2024 10:44:32 PM TECHNIQUE: Multiplanar multisequence MRI of the head/brain was performed with the administration of intravenous contrast. COMPARISON: MRI head 10/04/2024. CLINICAL HISTORY: Brain/CNS neoplasm, assess treatment response FINDINGS: BRAIN AND VENTRICLES: Continued increase in thickness of peripheral enhancement of a complex right temporoparietal mass .for example, peripheral enhancement measures 1.5 cm in thickness in the right temporal lobe on series 34, 36, previously 1.1 cm. Peripheral enhancement measures 1.2 cm in thickness posterior medial series 34, 41, previously 0.7 cm . Also, moderate increase in surrounding FLAIR hyperintensity in the right temporal, parietal,  and corpus callosal white matter. No new acute intracranial hemorrhage. No mass effect or midline shift. No hydrocephalus. The sella is unremarkable. 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 Scattered small T2 hyperintensities elsewhere in the cerebral white matter bilaterally are unchanged and compatible with mild chronic small vessel ischemic disease. 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 ORBITS: No acute abnormality. SINUSES: No acute abnormality. BONES AND SOFT TISSUES: Normal bone marrow signal and enhancement. No acute soft tissue abnormality. IMPRESSION: 1. Continued increase in peripheral enhancement of the known glioblastoma with increased surrounding FLAIR hyperintense signal, which could represent tumor progression and/or post-treatment change. Electronically signed by: Gilmore Molt 11/03/2024 11:42 PM EST RP Workstation: [id number]   DG Chest 2 View Result Date: 11/03/2024 CLINICAL DATA:  Weakness EXAM: DG CHEST 2V COMPARISON:  July 21, 2024 FINDINGS: Stable cardiomegaly. Both lungs are clear. The visualized skeletal structures are unremarkable. IMPRESSION: No active cardiopulmonary disease. Electronically Signed   By: Lynwood Landy Raddle M.D.   On: 11/03/2024 13:21   CT Head Wo Contrast Result Date: 11/03/2024 EXAM: CT HEAD WITHOUT CONTRAST _study_datetime_ TECHNIQUE: CT of the head was performed without the administration of intravenous contrast. Automated exposure control, iterative reconstruction, and/or weight based adjustment of  the mA/kV was utilized to reduce the radiation dose to as low as reasonably achievable. COMPARISON: MRI 10/04/2024 and CT head 07/26/2024. CLINICAL HISTORY: FINDINGS: BRAIN AND VENTRICLES: Redemonstrated postsurgical changes of right parietal craniotomy for biopsy of known mass lesion. Similar appearance of postprocedure changes along the biopsy tract. There is no evidence of acute  intracranial hemorrhage. Right parietal mass is overall slightly increased in size with slight increased involvement of the right splenium of the corpus callosum. There are increased areas of vasogenic edema within the right parietal lobe extending into the posterior right temporal lobe. There is similar effacement of the atrium and occipital horn of the right lateral ventricle. No significant midline shift. Atherosclerosis at the skull base. Remote infarcts in the right cerebellum. No evidence of acute infarct. No hydrocephalus. No extra-axial collection. ORBITS: No acute abnormality. SINUSES: No acute abnormality. SOFT TISSUES AND SKULL: Soft tissue swelling in the left facial soft tissues concerning for possible contusion. No skull fracture. IMPRESSION: 1. Slightly increased size of the right parietal mass with increased involvement of the right splenium of the corpus callosum. Increased associated vasogenic edema in the right parietal lobe. Recommend MRI brain with and without contrast for further evaluation. 2. Soft tissue swelling in the left facial soft tissues, concerning for possible contusion. 3. Similar evolving post-biopsy changes in the right temporoparietal lobes. Electronically signed by: Donnice Mania MD 11/03/2024 12:48 PM EST RP Workstation: [id number]   CUP PACEART REMOTE DEVICE CHECK Result Date: 10/26/2024 ILR summary report received. Battery status OK. Normal device function. No new symptom, tachy, brady, or pause episodes. No new AF episodes. Monthly summary reports and ROV/PRN AB, CVRS   ASSESSMENT & PLAN:  Glioblastoma Left Sided Weakness  SAIVON PROWSE presents with clinical and radiographic syndrome consistent with worsening motor function.  Etiology is suspected post-radiation inflammation and treatment effect.  This is based on radiographic changes, and very good response to steroids thus far.  Lower on differential is organic progression of tumor/neoplasm.  Recommended  continuing decadron  4mg  BID, even at discharge given burden of inflammation.  May withhold further chemotherapy until admission is complete.   He may dispo to inpatient rehab which is acceptable.   We will make arrangements for him to follow up with us  in clinic, once disposition is in place.   All questions were answered. The patient knows to call the clinic with any problems, questions or concerns.  I personally spent a total of 60 minutes including counseling and review of test results.     Jermany Sundell K Bless Belshe, MD 11/06/2024 4:16 PM

## 2024-11-06 NOTE — Progress Notes (Signed)
 Physical Therapy Treatment Patient Details Name: Wayne Molina MRN: [id number] DOB: 1956-06-29 Today's Date: 11/06/2024   History of Present Illness 68 y/o M presenting to ED on 12/26 with fall, generalized weakness, CT head with progressive worsening of previous mass and incr vasogenic edema.    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 Patient with rt knee buckle with descent to sitting on EOB with first standing trial and no further buckling after that (stood total of 6 times). Progressed to marching with RW and side-stepping along EOB. Pt required min (sometimes mod) assist to come to stand and min assist for balance and upright posture in standing. Patient very motivated and will benefit from post-acute inpatient therapies >3 hrs/day.     If plan is discharge home, recommend the following: Two people to help with walking and/or transfers;A lot of help with bathing/dressing/bathroom;Assistance with cooking/housework;Assist for transportation;Help with stairs or ramp for entrance;Supervision due to cognitive status   Can travel by private vehicle        Equipment Recommendations  Hospital bed    Recommendations for Other Services       Precautions / Restrictions Precautions Precautions: Fall Restrictions Weight Bearing Restrictions Per Provider Order: No     Mobility  Bed Mobility Overal bed mobility: Needs Assistance Bed Mobility: Rolling, Sidelying to Sit, Sit to Supine Rolling: Supervision Sidelying to sit: Contact guard assist, HOB elevated, Used rails   Sit to supine: Contact guard assist   General bed mobility comments: Increased time but no physical assistance.    Transfers Overall transfer level: Needs assistance Equipment used: Rolling walker (2 wheels), 1 person hand held assist Transfers: Sit to/from Stand Sit to  Stand: Min assist, Mod assist           General transfer comment: initial 2 transfers face to face with pt pushing off bed and then coming up to hold PTs UEs; initial attempt to achieve full upright with Rt knee buckling and pt returned to sitting EOB; ultimately progress to side stepping along EOB and then marching with RW    Ambulation/Gait             Pre-gait activities: see above in transfers; no +2 for chair follow     Stairs             Wheelchair Mobility     Tilt Bed    Modified Rankin (Stroke Patients Only)       Balance Overall balance assessment: Needs assistance Sitting-balance support: No upper extremity supported, Feet supported Sitting balance-Leahy Scale: Fair     Standing balance support: Bilateral upper extremity supported, During functional activity Standing balance-Leahy Scale: Poor                              Communication Communication Communication: No apparent difficulties  Cognition Arousal: Alert Behavior During Therapy: Flat affect                             Following commands: Impaired Following commands impaired: Follows one step commands with increased time    Cueing Cueing Techniques: Verbal cues, Gestural cues  Exercises General Exercises - Lower Extremity Heel Slides: AROM, Both, 10 reps Straight Leg Raises: AROM, Both, 10 reps Low Level/ICU Exercises Ankle Circles/Pumps: AROM,  Both, 10 reps Hip ABduction/ADduction: AROM, Both, 10 reps, Supine Stabilized Bridging: AROM, Both, 10 reps    General Comments General comments (skin integrity, edema, etc.): wife present      Pertinent Vitals/Pain Pain Assessment Pain Assessment: No/denies pain    Home Living         Home Access: Ramped entrance                Prior Function            PT Goals (current goals can now be found in the care plan section) Acute Rehab PT Goals Patient Stated Goal: to go home Time For Goal  Achievement: 11/18/24 Potential to Achieve Goals: Fair Progress towards PT goals: Progressing toward goals    Frequency    Min 2X/week      PT Plan      Co-evaluation              AM-PAC PT 6 Clicks Mobility   Outcome Measure  Help needed turning from your back to your side while in a flat bed without using bedrails?: A Little Help needed moving from lying on your back to sitting on the side of a flat bed without using bedrails?: A Little Help needed moving to and from a bed to a chair (including a wheelchair)?: A Lot Help needed standing up from a chair using your arms (e.g., wheelchair or bedside chair)?: A Lot Help needed to walk in hospital room?: Total Help needed climbing 3-5 steps with a railing? : Total 6 Click Score: 12    End of Session Equipment Utilized During Treatment: Gait belt Activity Tolerance: Patient tolerated treatment well Patient left: with call bell/phone within reach;with family/visitor present;in bed;with bed alarm set Nurse Communication: Mobility status PT Visit Diagnosis: Other abnormalities of gait and mobility (R26.89)     Time: 1536-1601 PT Time Calculation (min) (ACUTE ONLY): 25 min  Charges:    $Gait Training: 8-22 mins $Therapeutic Exercise: 8-22 mins PT General Charges $$ ACUTE PT VISIT: 1 Visit                      Wayne RAMAN, PT Acute Rehabilitation Services  Office [phone]    Wayne Molina 11/06/2024, 4:13 PM

## 2024-11-06 NOTE — Assessment & Plan Note (Signed)
 Improving with Nystatin  topical powder.  - Nystatin  topical powder twice daily

## 2024-11-07 ENCOUNTER — Other Ambulatory Visit: Payer: Self-pay

## 2024-11-07 DIAGNOSIS — I4891 Unspecified atrial fibrillation: Secondary | ICD-10-CM | POA: Diagnosis not present

## 2024-11-07 DIAGNOSIS — R531 Weakness: Secondary | ICD-10-CM | POA: Diagnosis not present

## 2024-11-07 DIAGNOSIS — W19XXXD Unspecified fall, subsequent encounter: Secondary | ICD-10-CM

## 2024-11-07 DIAGNOSIS — E785 Hyperlipidemia, unspecified: Secondary | ICD-10-CM

## 2024-11-07 DIAGNOSIS — R194 Change in bowel habit: Secondary | ICD-10-CM | POA: Insufficient documentation

## 2024-11-07 DIAGNOSIS — I10 Essential (primary) hypertension: Secondary | ICD-10-CM

## 2024-11-07 DIAGNOSIS — F32A Depression, unspecified: Secondary | ICD-10-CM

## 2024-11-07 DIAGNOSIS — M6281 Muscle weakness (generalized): Secondary | ICD-10-CM

## 2024-11-07 DIAGNOSIS — L304 Erythema intertrigo: Secondary | ICD-10-CM

## 2024-11-07 DIAGNOSIS — C719 Malignant neoplasm of brain, unspecified: Secondary | ICD-10-CM | POA: Diagnosis not present

## 2024-11-07 MED ORDER — DEXAMETHASONE SODIUM PHOSPHATE 4 MG/ML IJ SOLN
4.0000 mg | Freq: Two times a day (BID) | INTRAMUSCULAR | Status: DC
  Administered 2024-11-07 – 2024-11-10 (×6): 4 mg via INTRAVENOUS
  Filled 2024-11-07 (×6): qty 1

## 2024-11-07 NOTE — Assessment & Plan Note (Signed)
 Patient with 4 loose bowel movements yesterday. Discussed decreasing bowel regimen (taking miralax  17 g daily), but patient desires to continue current bowel regimen out of fear of missing a day of stool.  - continue miralax  17 g daily, low threshold to discontinue

## 2024-11-07 NOTE — Assessment & Plan Note (Addendum)
-   oncology following, suspect symptoms due to post-radiation inflammation, appreciate treatment recs - continue IV decadron  4 mg q6h to transition to PO Decadron  4 mg BID at discharge - no Chemo inpatient - PT eval and treat - Appreciate CIR evaluation, assistance with patient placement - Continue home Keppra  1000mg  BID for seizures 2/2 to brain mass  - Pain: Tylenol  500 mg q6hrs, Oxycodone  5 mg q6hrs PRN - Palliative care consulted, appreciate recs - lab holiday

## 2024-11-07 NOTE — TOC CM/SW Note (Addendum)
 Transition of Care [facility]) - Inpatient Brief Assessment   Patient Details  Name: ONOFRE GAINS MRN: [id number] Date of Birth: 06/25/1956  Transition of Care [facility]) CM/SW Contact:    Andrez JULIANNA George, RN Phone Number: 11/07/2024, 9:53 AM   Clinical Narrative:  Plan is for CIR pending Md consult for rehab and insurance auth.  IP Care management following.  Transition of Care Asessment: Insurance and Status: Insurance coverage has been reviewed Patient has primary care physician: Yes Home environment has been reviewed: home with spouse   Prior/Current Home Services: No current home services Social Drivers of Health Review: SDOH reviewed no interventions necessary Readmission risk has been reviewed: Yes Transition of care needs: transition of care needs identified, TOC will continue to follow

## 2024-11-07 NOTE — Assessment & Plan Note (Addendum)
 HTN - continue home Carvedilol  12.5 mg BID HLD - continue home Atorvastatin  40 mg daily Anxiety/depression - continue home Quetiapine  25 mg daily GERD - continue home Pantoprazole  40 mg daily

## 2024-11-07 NOTE — Progress Notes (Signed)
 IP rehab admissions - We are opening the case with insurance carrier to request CIR admission.  I will update all once we hear back from insurance case manager.  [phone]

## 2024-11-07 NOTE — Plan of Care (Signed)
   Problem: Activity: Goal: Risk for activity intolerance will decrease Outcome: Progressing

## 2024-11-07 NOTE — Consult Note (Signed)
 "     Physical Medicine and Rehabilitation Consult Reason for Consult:Rehab Referring Physician: Alena   HPI: Wayne Molina is a 68 y.o. male with past medical history of A-fib on Eliquis , hyperlipidemia, OSA, hypertension, depression, glioblastoma status post craniotomy, seizures, arthritis who presented to the hospital with generalized weakness.  Was recently taken off steroids with the use to treat his glioblastoma.  Patient had a prior admission at [facility] and was discharged on 08/09/2024.  CT of his head with slightly increased size of right parietal mass and increased vasogenic edema in the right parietal lobe, soft tissue swelling of left facial soft tissue concerning for contusion.  MRI brain with continued increase in peripheral enhancement of known glioblastoma with increased surrounding FLAIR hyperintense signal that could represent tumor progression and/or post treatment changes.  Patient was seen by medical oncology and continued on steroids.  Patient seen by PT and OT and felt to be a candidate for CIR.  Patient lives in a [place] home, family can assist 24 hours a day.  Home: Home Living Family/patient expects to be discharged to:: Private residence Living Arrangements: Spouse/significant other Available Help at Discharge: Family, Available 24 hours/day Type of Home: House Home Access: Ramped entrance Entrance Stairs-Number of Steps: 1 threshold step Entrance Stairs-Rails: None Home Layout: One level Bathroom Shower/Tub: Engineer, Manufacturing Systems: Handicapped height Bathroom Accessibility: Yes Home Equipment: Information systems manager, Agricultural Consultant (2 wheels), Wheelchair - manual, BSC/3in1  Lives With: Spouse  Functional History: Prior Function Prior Level of Function : Independent/Modified Independent, History of Falls (last six months) Mobility Comments: ambulatory short distances to bathroom with RW ADLs Comments: assist for bathing, ind with toileting, ind with dressing  and grooming tasks Functional Status:  Mobility: Bed Mobility Overal bed mobility: Needs Assistance Bed Mobility: Rolling, Sidelying to Sit, Sit to Supine Rolling: Supervision Sidelying to sit: Contact guard assist, HOB elevated, Used rails Supine to sit: Contact guard Sit to supine: Contact guard assist General bed mobility comments: Increased time but no physical assistance. Transfers Overall transfer level: Needs assistance Equipment used: Rolling walker (2 wheels), 1 person hand held assist Transfers: Sit to/from Stand Sit to Stand: Min assist, Mod assist General transfer comment: initial 2 transfers face to face with pt pushing off bed and then coming up to hold PTs UEs; initial attempt to achieve full upright with Rt knee buckling and pt returned to sitting EOB; ultimately progress to side stepping along EOB and then marching with RW Ambulation/Gait Ambulation/Gait assistance: +2 physical assistance, +2 safety/equipment, Mod assist Gait Distance (Feet): 20 Feet Assistive device: Rolling walker (2 wheels) Gait Pattern/deviations: Trunk flexed, Wide base of support, Shuffle, Decreased stride length, Step-through pattern General Gait Details: Pt able to ambulate to door and back with RW +2 Mod A. Constant cues for proximity to RW, safety, and sequencing. Gait velocity: decreased Pre-gait activities: see above in transfers; no +2 for chair follow    ADL: ADL Overall ADL's : Needs assistance/impaired Eating/Feeding: Set up Grooming: Set up Upper Body Bathing: Moderate assistance Lower Body Bathing: Moderate assistance Upper Body Dressing : Moderate assistance Lower Body Dressing: Moderate assistance Toilet Transfer: Moderate assistance, +2 for physical assistance, Ambulation, Rolling walker (2 wheels) Toileting- Clothing Manipulation and Hygiene: Moderate assistance Functional mobility during ADLs: Moderate assistance, Rolling walker (2 wheels), +2 for physical  assistance  Cognition: Cognition Orientation Level: Oriented X4 Cognition Arousal: Alert Behavior During Therapy: Flat affect   Review of Systems  Constitutional:  Negative for chills and fever.  HENT:  Negative for hearing loss.   Eyes:  Negative for blurred vision and double vision.  Respiratory:  Negative for cough and shortness of breath.   Cardiovascular:  Negative for chest pain.  Gastrointestinal:  Negative for abdominal pain, constipation, diarrhea, nausea and vomiting.  Genitourinary: Negative.   Musculoskeletal:  Positive for joint pain. Negative for back pain and neck pain.  Skin:  Negative for rash.  Neurological:  Positive for tingling and weakness. Negative for speech change.  Psychiatric/Behavioral:  Positive for memory loss. Negative for depression.    Past Medical History:  Diagnosis Date   Anxiety    Arthritis    fingers   Chronic atrial fibrillation (HCC)    Depression    Family history of breast cancer    Family history of cancer of male genital organ    Family history of gene mutation    BRIP1   Family history of malignant neoplasm of gastrointestinal tract    History of radiation therapy    08-23-24 - 09-12-24 Brain (parietal lobe) Dr. Lauraine Golden, MD   Hyperlipidemia    Hypertension    Personal history of colonic polyps 11/12/2006   Sleep apnea    Past Surgical History:  Procedure Laterality Date   2-D echocardiogram  07/22/2010   Ejection fraction greater than 55%. Left atrium moderately dilated. Right atrium moderately dilated. 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 Mild to Moderate MR. Mild to moderate TR.   APPLICATION OF CRANIAL NAVIGATION Right 07/17/2024   Procedure: COMPUTER-ASSISTED NAVIGATION, FOR CRANIAL PROCEDURE;  Surgeon: Rosslyn Dino HERO, MD;  Location: [facility] OR;  Service: Neurosurgery;  Laterality: Right;  CRANIAL NAVIGATION   ATRIAL ABLATION SURGERY  11/09/2002   EP IMPLANTABLE DEVICE N/A 02/11/2016   Procedure: Loop Recorder Insertion;   Surgeon: Jerel Balding, MD;  Location: [facility] INVASIVE CV LAB;  Service: Cardiovascular;  Laterality: N/A;   Exercise Myoview stress test  07/08/2000   Nonischemic low-risk.   HIP FRACTURE SURGERY  1970s   IR GENERIC HISTORICAL  01/21/2017   IR RADIOLOGIST EVAL & MGMT 01/21/2017 [facility]-INTERV RAD   STERIOTACTIC STIMULATOR INSERTION Right 07/17/2024   Procedure: OPEN CRANIOTOMY FOR BIOPSY;  Surgeon: Rosslyn Dino HERO, MD;  Location: [facility];  Service: Neurosurgery;  Laterality: Right;  RIGHT STEREOTACTIC BRAIN BIOPSY   Family History  Problem Relation Age of Onset   Dementia Mother    Heart disease Father    Diabetes Father    Other Sister        BRIP1 gene mutation   Kidney disease Brother    Colon cancer Maternal Uncle    Lung cancer Paternal Aunt        d. >50   Cancer Paternal Aunt        unknown type, d. >50   Cancer Cousin 17       gynecologic (paternal first cousin)   Colon cancer Nephew 27       arising in colon polyp   Cancer Niece 21       gynecologic; niece in her 65's   Breast cancer Niece 43   Other Niece        BRIP1 gene mutation   Cancer Paternal Great-grandmother        abdominal cancer (PGF's mother) great grandmother   Social History:  reports that he has quit smoking. His smoking use included cigarettes. He has never used smokeless tobacco. He reports that he does not drink alcohol and does not use drugs. Allergies: Allergies[1] Medications Prior to  Admission  Medication Sig Dispense Refill   acetaminophen  (TYLENOL ) 325 MG tablet Take 1-2 tablets (325-650 mg total) by mouth every 4 (four) hours as needed for mild pain (pain score 1-3).     apixaban  (ELIQUIS ) 5 MG TABS tablet Take 1 tablet (5 mg total) by mouth 2 (two) times daily. 60 tablet 0   atorvastatin  (LIPITOR) 40 MG tablet Take 1 tablet (40 mg total) by mouth daily. 30 tablet 0   calcium  carbonate (OSCAL) 1500 (600 Ca) MG TABS tablet Take 1 tablet (1,500 mg total) by mouth 2 (two) times daily with a meal. 60  tablet 2   carvedilol  (COREG ) 12.5 MG tablet Take 1 tablet (12.5 mg total) by mouth 2 (two) times daily. 60 tablet 3   Cyanocobalamin (VITAMIN B-12 PO) Take 1 capsule by mouth daily.     gabapentin  (NEURONTIN ) 100 MG capsule Take 1 capsule (100 mg total) by mouth 3 (three) times daily. (Patient taking differently: Take 100 mg by mouth 3 (three) times daily as needed.) 90 capsule 2   levETIRAcetam  (KEPPRA ) 1000 MG tablet Take 1 tablet (1,000 mg total) by mouth 2 (two) times daily. 60 tablet 2   Magnesium  Oxide -Mg Supplement 250 MG TABS Take 1 tablet (250 mg total) by mouth daily in the afternoon. 30 tablet 0   Magnesium  Oxide 250 MG TABS Take 1 tablet (250 mg total) by mouth daily in the afternoon. 30 tablet 0   melatonin 3 MG TABS tablet Take 1 tablet (3 mg total) by mouth at bedtime. 30 tablet 2   Multiple Vitamin (MULTIVITAMIN ADULT PO) Take 1 tablet by mouth daily.     ondansetron  (ZOFRAN ) 8 MG tablet Take 1 tablet (8 mg total) by mouth every 8 (eight) hours as needed for nausea or vomiting. May take 30-60 minutes prior to Temodar  administration if nausea/vomiting occurs as needed. 30 tablet 1   oxyCODONE  (OXY IR/ROXICODONE ) 5 MG immediate release tablet Take 1 tablet (5 mg total) by mouth every 6 (six) hours as needed for moderate pain (pain score 4-6). 30 tablet 0   pantoprazole  (PROTONIX ) 40 MG tablet Take 1 tablet (40 mg total) by mouth daily. 90 tablet 0   polyethylene glycol (MIRALAX  / GLYCOLAX ) 17 g packet Take 17 g by mouth daily.     QUEtiapine  (SEROQUEL ) 25 MG tablet Take 1 tablet (25 mg total) by mouth daily. 90 tablet 0   tamsulosin  (FLOMAX ) 0.4 MG CAPS capsule Take 1 capsule (0.4 mg total) by mouth daily. (Patient taking differently: Take 0.4 mg by mouth daily as needed.) 90 capsule 0   temozolomide  (TEMODAR ) 140 MG capsule Take 2 capsules (280 mg total) by mouth daily. Take for 5 days on, 23 days off. Repeat every 28 days. May take on an empty stomach to decrease nausea & vomiting.  10 capsule 0   furosemide  (LASIX ) 20 MG tablet Take 1 tablet (20 mg total) by mouth daily as needed for up to 14 days. Stop taking if swelling resolves (Patient not taking: Reported on 10/17/2024) 14 tablet 0   LORazepam  (ATIVAN ) 0.5 MG tablet Take 1 tablet 30 minutes prior to radiation procedures or MRIs as needed to help with anxiety. (OK to take 2 tablets before MRIs). (Patient not taking: Reported on 11/03/2024) 18 tablet 0   Nystatin  (GERHARDT'S BUTT CREAM) CREA Apply to affected area (Patient not taking: Reported on 11/03/2024) 60 each 0     Blood pressure 130/87, pulse 94, temperature (!) 97.3 F (36.3 C), temperature source  Oral, resp. rate 16, height 5' 8 (1.727 m), weight 72.6 kg, SpO2 99%. Physical Exam  General: No apparent distress HEENT: Head is normocephalic, atraumatic, sclera anicteric, oral mucosa a little dry Heart: Reg rate and rhythm. No murmurs rubs or gallops Chest: CTA bilaterally without wheezes, rales, or rhonchi; no distress Abdomen: Soft, non-tender, non-distended, bowel sounds positive. Extremities: No clubbing, cyanosis, or edema. Psych: Pt's affect is appropriate. Pt is cooperative Skin: Clean and intact without signs of breakdown, abrasion on his left thigh-reports happened during MRI Neuro:    Mental Status: AAOx person and place, not date (2026), Recalls 2/3 words 5 min later, slightly delayed responses Speech/Languate: Naming and repetition intact, fluent, speech a little slurred,  follows simple commands   CRANIAL NERVES: II: PERRL. Visual fields full III, IV, VI: EOM intact,  R eye ptosis- chronic  V: normal sensation bilaterally VII: no asymmetry VIII: normal hearing to speech IX, X: normal palatal elevation XI: 5/5 head turn and 5/5 shoulder shrug bilaterally XII: Tongue midline   MOTOR: RUE: 4+/5 Deltoid, 4/5 Biceps, 4/5 Triceps,4/5 Grip LUE: 4+/5 Deltoid, 4/5 Biceps, 4/5 Triceps, 4/5 Grip RLE: HF 4-/5, KE 4/5, ADF 4/5, APF 4/5 LLE: HF  4-/5, KE 4/5, ADF 4/5, APF 4/5   REFLEXES: No hyperreflexia noted, no Hoffman's, no clonus  SENSORY: Normal to touch all 4 extremities  Coordination: Normal finger to nose    No results found for this or any previous visit (from the past 24 hours). No results found.  Assessment/Plan: Diagnosis: Glioblastoma with vasogenic edema Does the need for close, 24 hr/day medical supervision in concert with the patient's rehab needs make it unreasonable for this patient to be served in a less intensive setting? Yes Co-Morbidities requiring supervision/potential complications:  - Intertrigo, hypertension, hyperlipidemia, anxiety, depression, GERD, A-fib, seizures Due to bladder management, bowel management, safety, skin/wound care, disease management, medication administration, pain management, and patient education, does the patient require 24 hr/day rehab nursing? Yes Does the patient require coordinated care of a physician, rehab nurse, therapy disciplines of PT/OT/SLP to address physical and functional deficits in the context of the above medical diagnosis(es)? Yes Addressing deficits in the following areas: balance, endurance, locomotion, strength, transferring, bowel/bladder control, bathing, dressing, feeding, grooming, toileting, cognition, speech, language, and psychosocial support Can the patient actively participate in an intensive therapy program of at least 3 hrs of therapy per day at least 5 days per week? Yes The potential for patient to make measurable gains while on inpatient rehab is excellent Anticipated functional outcomes upon discharge from inpatient rehab are supervision  with PT, supervision with OT, supervision with SLP. Estimated rehab length of stay to reach the above functional goals is: 7-10 Anticipated discharge destination: Home Overall Rehab/Functional Prognosis: excellent  POST ACUTE RECOMMENDATIONS: This patient's condition is appropriate for continued  rehabilitative care in the following setting: CIR Patient has agreed to participate in recommended program. Yes Note that insurance prior authorization may be required for reimbursement for recommended care.  Comment: I think patient be a good candidate for CIR, rehab coordinator to follow-up.    I have personally performed a face to face diagnostic evaluation of this patient. Additionally, I have examined the patient's medical record including any pertinent labs and radiographic images.    Thanks,  Murray Collier, MD 11/07/2024     [1] No Known Allergies  "

## 2024-11-07 NOTE — Progress Notes (Signed)
 Physical Therapy Treatment Patient Details Name: Wayne Molina MRN: [id number] DOB: 10/29/1956 Today's Date: 11/07/2024   History of Present Illness 68 y/o M presenting to ED on 12/26 with fall, generalized weakness, CT head with progressive worsening of previous mass and incr vasogenic edema.    PMH includes glioblastoma, A fib on Eliquis , HTN, HLD, OSA, anxiety/depression, seizures 2/2 brain mass    PT Comments  Patient remains highly motivated and able to make some adjustments to his technique with cues and facilitation to progress towards greater independence. With +2 follow with chair for safety, pt able to progress to ambulating with RW with min assist 25 ft x 2 reps (seated rest). Continues with decr strength, balance, endurance, and knowledge of use of DME for which he can benefit from post-acute inpatient therapies >3 hrs/day.     If plan is discharge home, recommend the following: Two people to help with walking and/or transfers;A lot of help with bathing/dressing/bathroom;Assistance with cooking/housework;Assist for transportation;Help with stairs or ramp for entrance;Supervision due to cognitive status   Can travel by private vehicle        Equipment Recommendations  Hospital bed    Recommendations for Other Services       Precautions / Restrictions Precautions Precautions: Fall Restrictions Weight Bearing Restrictions Per Provider Order: No     Mobility  Bed Mobility Overal bed mobility: Needs Assistance Bed Mobility: Rolling, Sidelying to Sit Rolling: Supervision Sidelying to sit: Contact guard assist, HOB elevated, Used rails       General bed mobility comments: Increased time but no physical assistance.    Transfers Overall transfer level: Needs assistance Equipment used: Rolling walker (2 wheels), 1 person hand held assist Transfers: Sit to/from Stand Sit to Stand: Min assist, +2 safety/equipment           General transfer comment: from EOB and from  recliner to RW with cues for sequencing; boosting assist    Ambulation/Gait Ambulation/Gait assistance: +2 safety/equipment, Min assist Gait Distance (Feet): 25 Feet (seated rest, 25) Assistive device: Rolling walker (2 wheels) Gait Pattern/deviations: Trunk flexed, Wide base of support, Shuffle, Decreased stride length, Step-through pattern Gait velocity: decreased     General Gait Details: constant cues and assist for proximity to RW, upright posture, and incr step length. Chair follow for safety   Stairs             Wheelchair Mobility     Tilt Bed    Modified Rankin (Stroke Patients Only)       Balance Overall balance assessment: Needs assistance Sitting-balance support: No upper extremity supported, Feet supported Sitting balance-Leahy Scale: Fair     Standing balance support: Bilateral upper extremity supported, During functional activity Standing balance-Leahy Scale: Poor Standing balance comment: tactile cues to weightshift, cues to keep RW close                            Communication Communication Communication: No apparent difficulties  Cognition Arousal: Alert Behavior During Therapy: Flat affect                             Following commands: Impaired Following commands impaired: Follows one step commands with increased time    Cueing Cueing Techniques: Verbal cues, Gestural cues  Exercises      General Comments General comments (skin integrity, edema, etc.): wife present      Pertinent Vitals/Pain Pain Assessment  Pain Assessment: No/denies pain    Home Living                          Prior Function            PT Goals (current goals can now be found in the care plan section) Acute Rehab PT Goals Patient Stated Goal: to go home Time For Goal Achievement: 11/18/24 Potential to Achieve Goals: Fair Progress towards PT goals: Progressing toward goals    Frequency    Min 2X/week      PT  Plan      Co-evaluation              AM-PAC PT 6 Clicks Mobility   Outcome Measure  Help needed turning from your back to your side while in a flat bed without using bedrails?: A Little Help needed moving from lying on your back to sitting on the side of a flat bed without using bedrails?: A Little Help needed moving to and from a bed to a chair (including a wheelchair)?: A Little Help needed standing up from a chair using your arms (e.g., wheelchair or bedside chair)?: A Little Help needed to walk in hospital room?: Total Help needed climbing 3-5 steps with a railing? : Total 6 Click Score: 14    End of Session Equipment Utilized During Treatment: Gait belt Activity Tolerance: Patient tolerated treatment well Patient left: with call bell/phone within reach;with family/visitor present;in chair;with chair alarm set Nurse Communication: Mobility status PT Visit Diagnosis: Other abnormalities of gait and mobility (R26.89)     Time: 8941-8884 PT Time Calculation (min) (ACUTE ONLY): 17 min  Charges:    $Gait Training: 8-22 mins PT General Charges $$ ACUTE PT VISIT: 1 Visit                      Macario RAMAN, PT Acute Rehabilitation Services  Office [phone]    Macario SHAUNNA Soja 11/07/2024, 11:55 AM

## 2024-11-07 NOTE — Assessment & Plan Note (Addendum)
 Continues to improve with Nystatin  topical powder.  - Nystatin  topical powder twice daily

## 2024-11-07 NOTE — Progress Notes (Signed)
 "                                                                                                                                                                                                         Palliative Medicine Progress Note   Patient Name: Wayne Molina       Date: 11/07/2024 DOB: 1956/04/03  Age: 68 y.o. MRN#: [id number] Attending Physician: Rosalynn Camie LITTIE, MD Primary Care Physician: Janna Ferrier, DO Admit Date: 11/03/2024   HPI/Patient Profile: 68 y.o. 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  He was also found to be in A-fib with RVR.  MRI showed continued increase in peripheral enhancement of known glioblastoma with increased surrounding FLAIR hyperintense signal, which could represent tumor progression and/or posttreatment change.   Palliative Medicine has been consulted for goals of care discussions and complex medical decision making.   Subjective: Chart reviewed. Patient assessed. He is currently sleeping and I did not attempt to wake him.   I met with his wife Wayne Molina at bedside as a follow up for palliative needs and emotional support. She feels his clinical condition has improved and is hopeful he will be accepted to CIR. Reviewed recommendations per neuro oncology; may withhold further chemotherapy until admission is complete.   Hard choices book and MOST form provided for review.    Objective:  Physical Exam Vitals reviewed.  Constitutional:      General: He is sleeping. He is not in acute distress.    Appearance: He is ill-appearing.  Pulmonary:     Effort: Pulmonary effort is normal.  Skin:    General: Skin is warm and dry.             Palliative Medicine Assessment & Plan   Assessment: Principal Problem:   Generalized weakness Active Problems:   Glioblastoma,  IDH-wildtype (HCC)   Chronic health problem   Fall   Vasogenic edema (HCC)   Intertrigo   Frequent bowel movements    Recommendations/Plan: Full code for now, with ongoing discussion Continue full scope care Goal of care is rehab to allow opportunity for improvement of functional status PMT will continue to follow   Primary Decision Maker: HCPOA - wife/Wayne Molina   Existing Vynca/ACP Documentation: HCPOA document and living will   Prognosis:  Unable to determine   Discharge Planning:  To Be Determined    Thank you for allowing the  Palliative Medicine Team to assist in the care of this patient.   Time: 28 minutes   Recardo KATHEE Loll, NP   Please contact Palliative Medicine Team phone at [phone] for questions and concerns.  For individual providers, please see AMION.      "

## 2024-11-07 NOTE — Progress Notes (Signed)
" ° ° ° °  Daily Progress Note Intern Pager: [phone]  Patient name: Wayne Molina Medical record number: [id number] Date of birth: Nov 22, 1955 Age: 68 y.o. 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  - Nystatin  topical powder twice daily Frequent bowel movements Patient with 4 loose bowel movements yesterday. Discussed decreasing bowel regimen (taking miralax  17 g daily), but patient desires to continue current bowel regimen out of fear of missing a day of stool.  - continue miralax  17 g daily, low threshold to discontinue Chronic health problem HTN - continue home Carvedilol  12.5 mg BID HLD - continue home Atorvastatin  40 mg daily Anxiety/depression - continue home Quetiapine  25 mg daily GERD - continue home Pantoprazole  40 mg daily  FEN/GI: regular PPx: eliquis  Dispo:CIR when eval completed/bed available.   Subjective:  No concerns this morning.  His medial thigh and groin irritation is improving and his nystatin  powder. Pooped 4 times yesterday. Wants to keep taking miralax  daily. Good appetite.   Objective: Temp:  [97.4 F (36.3 C)-98.4 F (36.9 C)] 98 F (36.7 C) (12/29 2320) Pulse Rate:  [70-98] 70 (12/29 2320) Resp:  [16-18] 16 (12/29 2320) BP: (114-136)/(65-83) 120/82 (12/29 2320) SpO2:  [97 %-99 %] 97 % (12/29 2320) Physical Exam: General: well appearing male sitting up in bed, moon facies Cardiovascular: RRR,  radial pulses Respiratory: CTAB Abdomen: + bowel sounds, soft, distended, nontender Neuro: A&O to person, place, situation, 4+/5 BUE and BLE strength, sensation intact BLE and BUE  Laboratory: None  Imaging/Diagnostic Tests: None  Alena Morrison, Obryan Radu, MD 11/07/2024, 1:33 AM  PGY-1, [facility] Family Medicine FPTS Intern pager: [phone], text pages welcome Secure chat group [facility] Teaching Service   "

## 2024-11-07 NOTE — Plan of Care (Signed)
 Pt. Is progressing, awake, alert, able to follow commands, and cooperative. Wife is at bedside.

## 2024-11-08 ENCOUNTER — Other Ambulatory Visit (HOSPITAL_COMMUNITY): Payer: Self-pay

## 2024-11-08 ENCOUNTER — Encounter: Payer: Self-pay | Admitting: Pharmacist

## 2024-11-08 ENCOUNTER — Other Ambulatory Visit: Payer: Self-pay

## 2024-11-08 ENCOUNTER — Encounter: Payer: Self-pay | Admitting: Internal Medicine

## 2024-11-08 DIAGNOSIS — G936 Cerebral edema: Secondary | ICD-10-CM

## 2024-11-08 DIAGNOSIS — C719 Malignant neoplasm of brain, unspecified: Secondary | ICD-10-CM | POA: Diagnosis not present

## 2024-11-08 DIAGNOSIS — R531 Weakness: Secondary | ICD-10-CM | POA: Diagnosis not present

## 2024-11-08 NOTE — Assessment & Plan Note (Signed)
-   oncology following, suspect symptoms due to post-radiation inflammation, appreciate treatment recs - continue IV decadron  4 mg q6h to transition to PO Decadron  4 mg BID at discharge - no Chemo inpatient - PT eval and treat - Appreciate CIR evaluation, assistance with patient placement - Continue home Keppra  1000mg  BID for seizures 2/2 to brain mass  - Pain: Tylenol  500 mg q6hrs, Oxycodone  5 mg q6hrs PRN - Palliative care consulted, appreciate recs

## 2024-11-08 NOTE — Assessment & Plan Note (Signed)
 HTN - continue home Carvedilol  12.5 mg BID, if diastolics remain elevated may require additional BP medicine HLD - continue home Atorvastatin  40 mg daily Anxiety/depression - continue home Quetiapine  25 mg daily GERD - continue home Pantoprazole  40 mg daily Intertrigo - Nystatin  topical powder twice daily

## 2024-11-08 NOTE — Assessment & Plan Note (Signed)
 HTN - continue home Carvedilol  12.5 mg BID HLD - continue home Atorvastatin  40 mg daily Anxiety/depression - continue home Quetiapine  25 mg daily GERD - continue home Pantoprazole  40 mg daily

## 2024-11-08 NOTE — Progress Notes (Shared)
" ° ° ° °  Daily Progress Note Intern Pager: [phone]  Patient name: Wayne Molina Medical record number: [id number] Date of birth: 1956-02-23 Age: 68 y.o. Gender: male  Primary Care Provider: Janna Ferrier, DO Consultants: heme/onc, palliative care  Code Status: Full  Pt Overview and Major Events to Date:  12/26 - admitted   Medical Decision Making:  68 yo male with PMH of Afib on Eliquis , HTN, HLD currently admitted after a fall in setting of known  glioblastoma complicated by seizures with intermittent weakness.  Now improved with reinitiation of higher dose steroids.  He is now medically stable for discharge to CIR.  Assessment & Plan Generalized weakness Fall Glioblastoma, IDH-wildtype (HCC) Vasogenic edema (HCC) - oncology following, suspect symptoms due to post-radiation inflammation, appreciate treatment recs - continue IV decadron  4 mg q6h to transition to PO Decadron  4 mg BID at discharge - no Chemo inpatient - PT eval and treat - Appreciate CIR evaluation, assistance with patient placement - Continue home Keppra  1000mg  BID for seizures 2/2 to brain mass  - Pain: Tylenol  500 mg q6hrs, Oxycodone  5 mg q6hrs PRN - Palliative care consulted, appreciate recs Intertrigo Continues to improve with Nystatin  topical powder.  - Nystatin  topical powder twice daily Frequent bowel movements - continue miralax  17 g daily, low threshold to discontinue Chronic health problem HTN - continue home Carvedilol  12.5 mg BID HLD - continue home Atorvastatin  40 mg daily Anxiety/depression - continue home Quetiapine  25 mg daily GERD - continue home Pantoprazole  40 mg daily   FEN/GI: Regular PPx: Eliquis  Dispo: Pending insurance authorization for SNF  Subjective:  ***  Objective: Temp:  [97.4 F (36.3 C)-97.8 F (36.6 C)] 97.4 F (36.3 C) (12/31 2015) Pulse Rate:  [73-95] 81 (12/31 2015) Resp:  [16-19] 18 (12/31 2015) BP: (122-140)/(83-92) 126/89 (12/31 2015) SpO2:  [95 %-98 %] 96 %  (12/31 2015) Physical Exam: General: *** Cardiovascular: *** Respiratory: *** Abdomen: *** Extremities: ***  Laboratory: Most recent CBC Lab Results  Component Value Date   WBC 4.3 11/04/2024   HGB 13.6 11/04/2024   HCT 39.6 11/04/2024   MCV 95.2 11/04/2024   PLT 213 11/04/2024   Most recent BMP    Latest Ref Rng & Units 11/05/2024    6:50 AM  BMP  Glucose 70 - 99 mg/dL 830   BUN 8 - 23 mg/dL 19   Creatinine 9.38 - 1.24 mg/dL 9.28   Sodium 864 - 854 mmol/L 137   Potassium 3.5 - 5.1 mmol/L 4.1   Chloride 98 - 111 mmol/L 103   CO2 22 - 32 mmol/L 23   Calcium  8.9 - 10.3 mg/dL 9.1     Imaging/Diagnostic Tests: No new imaging.   Alba Sharper, MD 11/08/2024, 8:29 PM  PGY-3, [facility] Health Family Medicine FPTS Intern pager: [phone], text pages welcome Secure chat group [facility] Teaching Service    "

## 2024-11-08 NOTE — Progress Notes (Signed)
" ° ° ° °  Daily Progress Note Intern Pager: [phone]  Patient name: Wayne Molina Medical record number: [id number] Date of birth: 1956/03/02 Age: 68 y.o. Gender: male  Primary Care Provider: Janna Ferrier, DO Consultants: heme/onc, palliative care  Code Status: Full  Pt Overview and Major Events to Date:  12/26 - admitted   Medical Decision Making:  68 yo male with PMH of Afib on Eliquis , HTN, HLD currently admitted after a fall in setting of known  glioblastoma complicated by seizures.  Now improved with reinitiation of higher dose steroids.  He is now medically stable for discharge to CIR. Assessment & Plan Generalized weakness Fall Glioblastoma, IDH-wildtype (HCC) Vasogenic edema (HCC) - oncology following, suspect symptoms due to post-radiation inflammation, appreciate treatment recs - continue IV decadron  4 mg q6h to transition to PO Decadron  4 mg BID at discharge - no Chemo inpatient - PT eval and treat - Appreciate CIR evaluation, assistance with patient placement - Continue home Keppra  1000mg  BID for seizures 2/2 to brain mass  - Pain: Tylenol  500 mg q6hrs, Oxycodone  5 mg q6hrs PRN - Palliative care consulted, appreciate recs Intertrigo Continues to improve with Nystatin  topical powder.  - Nystatin  topical powder twice daily Frequent bowel movements - continue miralax  17 g daily, low threshold to discontinue Chronic health problem HTN - continue home Carvedilol  12.5 mg BID HLD - continue home Atorvastatin  40 mg daily Anxiety/depression - continue home Quetiapine  25 mg daily GERD - continue home Pantoprazole  40 mg daily       FEN/GI: Regular PPx: Eliquis  Dispo: Pending insurance authorization for CIR  Subjective:  No acute events overnight.  Patient reports he is overall improved since he came to the hospital.  Understands plan for CIR pending insurance authorization.  Answered all questions.  Objective: Temp:  [97.3 F (36.3 C)-98.6 F (37 C)] 97.8 F (36.6  C) (12/31 0318) Pulse Rate:  [68-94] 78 (12/31 0318) Resp:  [16-17] 16 (12/31 0318) BP: (115-173)/(72-87) 123/87 (12/31 0318) SpO2:  [95 %-99 %] 96 % (12/31 0318) Physical Exam: General: NAD, well-appearing Neuro: A&O, symmetrically weak 4+ out of 5 bilateral upper extremities, no gross focal deficits Cardiovascular: RRR, no murmurs,  Respiratory: normal WOB on RA, CTAB, no wheezes, ronchi or rales Extremities: Moving all 4 extremities equally, no peripheral edema   Laboratory: Most recent CBC Lab Results  Component Value Date   WBC 4.3 11/04/2024   HGB 13.6 11/04/2024   HCT 39.6 11/04/2024   MCV 95.2 11/04/2024   PLT 213 11/04/2024   Most recent BMP    Latest Ref Rng & Units 11/05/2024    6:50 AM  BMP  Glucose 70 - 99 mg/dL 830   BUN 8 - 23 mg/dL 19   Creatinine 9.38 - 1.24 mg/dL 9.28   Sodium 864 - 854 mmol/L 137   Potassium 3.5 - 5.1 mmol/L 4.1   Chloride 98 - 111 mmol/L 103   CO2 22 - 32 mmol/L 23   Calcium  8.9 - 10.3 mg/dL 9.1     Imaging/Diagnostic Tests: No new imaging.  Alba Sharper, MD 11/08/2024, 5:21 AM  PGY-3, [facility] Health Family Medicine FPTS Intern pager: [phone], text pages welcome Secure chat group [facility] Teaching Service    "

## 2024-11-08 NOTE — Progress Notes (Signed)
 Occupational Therapy Treatment Patient Details Name: Wayne Molina MRN: [id number] DOB: 01/13/1956 Today's Date: 11/08/2024   History of present illness 68 y/o M presenting to ED on 12/26 with fall, generalized weakness, CT head with progressive worsening of previous mass and incr vasogenic edema.    PMH includes glioblastoma, A fib on Eliquis , HTN, HLD, OSA, anxiety/depression, seizures 2/2 brain mass   OT comments  Patient received in supine with wife present and eager to participate with OT. Patient demonstrating good gains with OT treatment with bed mobility, self care, and standing balance. Patient states fatigue following self care at sink and in recliner at end of session. Patient will benefit from intensive inpatient follow-up therapy, >3 hours/day.  Acute OT to continue to follow to address established goals to facilitate DC to next venue of care.        If plan is discharge home, recommend the following:  A lot of help with walking and/or transfers;A lot of help with bathing/dressing/bathroom;Assistance with cooking/housework;Direct supervision/assist for medications management;Direct supervision/assist for financial management;Assist for transportation;Help with stairs or ramp for entrance   Equipment Recommendations  None recommended by OT    Recommendations for Other Services      Precautions / Restrictions Precautions Precautions: Fall Restrictions Weight Bearing Restrictions Per Provider Order: No       Mobility Bed Mobility Overal bed mobility: Needs Assistance Bed Mobility: Rolling, Sidelying to Sit Rolling: Supervision Sidelying to sit: Contact guard assist, HOB elevated, Used rails       General bed mobility comments: increased time and verbal cues    Transfers Overall transfer level: Needs assistance Equipment used: Rolling walker (2 wheels) Transfers: Sit to/from Stand Sit to Stand: Min assist           General transfer comment: cues for hand  placement and min assist to power up and occasional cues for walker management and keeping close distance with RW     Balance Overall balance assessment: Needs assistance Sitting-balance support: No upper extremity supported, Feet supported Sitting balance-Leahy Scale: Fair Sitting balance - Comments: EOB   Standing balance support: Single extremity supported, Bilateral upper extremity supported, During functional activity Standing balance-Leahy Scale: Poor Standing balance comment: one extremity support when standing at sink for self care                           ADL either performed or assessed with clinical judgement   ADL Overall ADL's : Needs assistance/impaired Eating/Feeding: Set up   Grooming: Wash/dry hands;Wash/dry face;Oral care;Brushing hair;Contact guard assist;Standing   Upper Body Bathing: Minimal assistance;Standing   Lower Body Bathing: Moderate assistance;Sit to/from stand       Lower Body Dressing: Moderate assistance                 General ADL Comments: occasional cues for sequencing    Extremity/Trunk Assessment              Vision       Perception     Praxis     Communication Communication Communication: No apparent difficulties   Cognition Arousal: Alert Behavior During Therapy: Flat affect Cognition: Cognition impaired   Orientation impairments: Situation Awareness: Online awareness impaired Memory impairment (select all impairments): Short-term memory Attention impairment (select first level of impairment): Sustained attention Executive functioning impairment (select all impairments): Problem solving, Sequencing  Following commands: Impaired Following commands impaired: Follows one step commands with increased time      Cueing   Cueing Techniques: Verbal cues, Gestural cues  Exercises      Shoulder Instructions       General Comments wife present and supportive    Pertinent  Vitals/ Pain       Pain Assessment Pain Assessment: No/denies pain  Home Living                                          Prior Functioning/Environment              Frequency  Min 2X/week        Progress Toward Goals  OT Goals(current goals can now be found in the care plan section)  Progress towards OT goals: Progressing toward goals  Acute Rehab OT Goals Patient Stated Goal: to go home OT Goal Formulation: With patient/family Time For Goal Achievement: 11/18/24 Potential to Achieve Goals: Good ADL Goals Pt Will Perform Upper Body Dressing: sitting;standing;with supervision Pt Will Perform Lower Body Dressing: sitting/lateral leans;sit to/from stand;with supervision Pt Will Transfer to Toilet: with contact guard assist;ambulating;regular height toilet Pt Will Perform Tub/Shower Transfer: Shower transfer;with contact guard assist;ambulating;shower seat  Plan      Co-evaluation                 AM-PAC OT 6 Clicks Daily Activity     Outcome Measure   Help from another person eating meals?: A Little Help from another person taking care of personal grooming?: A Little Help from another person toileting, which includes using toliet, bedpan, or urinal?: A Lot Help from another person bathing (including washing, rinsing, drying)?: A Lot Help from another person to put on and taking off regular upper body clothing?: A Lot Help from another person to put on and taking off regular lower body clothing?: A Lot 6 Click Score: 14    End of Session Equipment Utilized During Treatment: Gait belt;Rolling walker (2 wheels)  OT Visit Diagnosis: Unsteadiness on feet (R26.81);Other abnormalities of gait and mobility (R26.89);Muscle weakness (generalized) (M62.81)   Activity Tolerance Patient tolerated treatment well   Patient Left in chair;with call bell/phone within reach;with chair alarm set;with family/visitor present   Nurse Communication Mobility  status        Time: 0726-0759 OT Time Calculation (min): 33 min  Charges: OT General Charges $OT Visit: 1 Visit OT Treatments $Self Care/Home Management : 23-37 mins  Dick Laine, OTA Acute Rehabilitation Services  Office [phone]   [name] 11/08/2024, 9:15 AM

## 2024-11-08 NOTE — Assessment & Plan Note (Signed)
 Continues to improve with Nystatin  topical powder.  - Nystatin  topical powder twice daily

## 2024-11-08 NOTE — Assessment & Plan Note (Addendum)
-   continue miralax  17 g daily, low threshold to discontinue

## 2024-11-08 NOTE — Plan of Care (Signed)
   Problem: Nutrition: Goal: Adequate nutrition will be maintained Outcome: Progressing

## 2024-11-08 NOTE — Assessment & Plan Note (Signed)
-   continue miralax  17 g daily, low threshold to discontinue

## 2024-11-09 DIAGNOSIS — R531 Weakness: Secondary | ICD-10-CM | POA: Diagnosis not present

## 2024-11-09 DIAGNOSIS — R300 Dysuria: Secondary | ICD-10-CM | POA: Insufficient documentation

## 2024-11-09 LAB — URINALYSIS, ROUTINE W REFLEX MICROSCOPIC
Bacteria, UA: NONE SEEN
Bilirubin Urine: NEGATIVE
Glucose, UA: >=500 mg/dL — AB
Hgb urine dipstick: NEGATIVE
Ketones, ur: NEGATIVE mg/dL
Leukocytes,Ua: NEGATIVE
Nitrite: NEGATIVE
Protein, ur: 30 mg/dL — AB
Specific Gravity, Urine: 1.021 (ref 1.005–1.030)
pH: 5 (ref 5.0–8.0)

## 2024-11-09 NOTE — Assessment & Plan Note (Signed)
-   oncology following, suspect symptoms due to post-radiation inflammation, appreciate treatment recs - continue IV decadron  4 mg q6h to transition to PO Decadron  4 mg BID at discharge - no Chemo inpatient - PT eval and treat - Appreciate CIR evaluation, assistance with patient placement - Continue home Keppra  1000mg  BID for seizures 2/2 to brain mass  - Pain: Tylenol  500 mg q6hrs, Oxycodone  5 mg q6hrs PRN - Palliative care consulted, appreciate recs

## 2024-11-09 NOTE — Plan of Care (Signed)
 Wife is with him at the bedside.  Problem: Skin Integrity: Goal: Risk for impaired skin integrity will decrease Outcome: Progressing   Problem: Pain Managment: Goal: General experience of comfort will improve and/or be controlled Outcome: Progressing   Problem: Elimination: Goal: Will not experience complications related to urinary retention Outcome: Progressing   Problem: Elimination: Goal: Will not experience complications related to bowel motility Outcome: Progressing   Problem: Activity: Goal: Risk for activity intolerance will decrease Outcome: Progressing   Problem: Nutrition: Goal: Adequate nutrition will be maintained Outcome: Progressing   Problem: Education: Goal: Knowledge of General Education information will improve Description: Including pain rating scale, medication(s)/side effects and non-pharmacologic comfort measures Outcome: Progressing

## 2024-11-09 NOTE — Assessment & Plan Note (Signed)
-   Repeat UA today for potential infection - Patient reported gross hematuria, will need outpatient urology evaluation, but may be secondary to chemotherapy and Eliquis  - AM CBC

## 2024-11-09 NOTE — Assessment & Plan Note (Signed)
 Repeat UA 1/1 without evidence of infection. - Patient has previously reported gross hematuria, will need outpatient urology evaluation, but may be secondary to chemotherapy and Eliquis  - AM CBC to trend

## 2024-11-09 NOTE — Plan of Care (Signed)

## 2024-11-09 NOTE — Progress Notes (Signed)
" ° ° ° °  Daily Progress Note Intern Pager: [phone]  Patient name: Wayne Molina Medical record number: [id number] Date of birth: [birth year] Age: 69 y.o. Gender: male  Primary Care Provider: Janna Ferrier, DO Consultants: Heme/Onc, Palliative Code Status: FULL  Pt Overview and Major Events to Date:  12/26 - Admitted to FMTS ***  Medical Decision Making: 69 yo male with PMH of Afib on Eliquis , HTN, HLD currently admitted after a fall in the setting of known glioblastoma complicated by seizures with intermittent weakness.  Now improved with reinitiation of higher dose steroids.  He remains medically stable for discharge to CIR.  *** Assessment & Plan Generalized weakness Fall Glioblastoma, IDH-wildtype (HCC) Vasogenic edema (HCC) - oncology following, suspect symptoms due to post-radiation inflammation, appreciate treatment recs - continue IV decadron  4 mg q6h to transition to PO Decadron  4 mg BID at discharge *** - no Chemo inpatient - PT eval and treat - Appreciate CIR evaluation, assistance with patient placement - Continue home Keppra  1000mg  BID for seizures 2/2 to brain mass  - Pain: Tylenol  500 mg q6hrs, Oxycodone  5 mg q6hrs PRN - Palliative care consulted, appreciate recs Dysuria Repeat UA 1/1 without evidence of infection. - Patient has previously reported gross hematuria, will need outpatient urology evaluation, but may be secondary to chemotherapy and Eliquis  - AM CBC to trend Chronic health problem HTN - continue home Carvedilol  12.5 mg BID, if diastolics remain elevated may require additional BP medicine HLD - continue home Atorvastatin  40 mg daily Anxiety/depression - continue home Quetiapine  25 mg daily GERD - continue home Pantoprazole  40 mg daily Intertrigo - Nystatin  topical powder twice daily  FEN/GI: Regular, Miralax  daily PPx: Eliquis  Dispo: Pending insurance authorization for SNF/CIR approval  Subjective:  NAEON. Patient seen this AM  ***  Objective: Temp:  [97.3 F (36.3 C)-98.6 F (37 C)] 97.6 F (36.4 C) (01/01 1509) Pulse Rate:  [65-81] 79 (01/01 1509) Resp:  [16-18] 16 (01/01 1509) BP: (110-151)/(65-104) 132/84 (01/01 1509) SpO2:  [96 %-99 %] 99 % (01/01 1509) Physical Exam: General: *** Cardiovascular: *** Respiratory: *** Abdomen: *** Extremities: ***  Laboratory: Most recent CBC Lab Results  Component Value Date   WBC 4.3 11/04/2024   HGB 13.6 11/04/2024   HCT 39.6 11/04/2024   MCV 95.2 11/04/2024   PLT 213 11/04/2024   Most recent BMP    Latest Ref Rng & Units 11/05/2024    6:50 AM  BMP  Glucose 70 - 99 mg/dL 830   BUN 8 - 23 mg/dL 19   Creatinine 9.38 - 1.24 mg/dL 9.28   Sodium 864 - 854 mmol/L 137   Potassium 3.5 - 5.1 mmol/L 4.1   Chloride 98 - 111 mmol/L 103   CO2 22 - 32 mmol/L 23   Calcium  8.9 - 10.3 mg/dL 9.1    Lafe Domino, DO 11/09/2024, 9:04 PM  PGY-2, [facility] FPTS Intern pager: [phone], text pages welcome Secure chat group [facility] Teaching Service   "

## 2024-11-09 NOTE — Assessment & Plan Note (Signed)
 HTN - continue home Carvedilol  12.5 mg BID, if diastolics remain elevated may require additional BP medicine HLD - continue home Atorvastatin  40 mg daily Anxiety/depression - continue home Quetiapine  25 mg daily GERD - continue home Pantoprazole  40 mg daily Intertrigo - Nystatin  topical powder twice daily

## 2024-11-09 NOTE — Plan of Care (Signed)
" °  Problem: Education: Goal: Knowledge of General Education information will improve Description: Including pain rating scale, medication(s)/side effects and non-pharmacologic comfort measures 11/09/2024 0700 by Durenda Lieu, RN Outcome: Progressing 11/09/2024 0700 by Durenda Lieu, RN Outcome: Progressing   Problem: Health Behavior/Discharge Planning: Goal: Ability to manage health-related needs will improve 11/09/2024 0700 by Durenda Lieu, RN Outcome: Progressing 11/09/2024 0700 by Durenda Lieu, RN Outcome: Progressing   Problem: Clinical Measurements: Goal: Ability to maintain clinical measurements within normal limits will improve 11/09/2024 0700 by Durenda Lieu, RN Outcome: Progressing 11/09/2024 0700 by Durenda Lieu, RN Outcome: Progressing Goal: Will remain free from infection 11/09/2024 0700 by Durenda Lieu, RN Outcome: Progressing 11/09/2024 0700 by Durenda Lieu, RN Outcome: Progressing Goal: Diagnostic test results will improve 11/09/2024 0700 by Durenda Lieu, RN Outcome: Progressing 11/09/2024 0700 by Durenda Lieu, RN Outcome: Progressing Goal: Respiratory complications will improve 11/09/2024 0700 by Durenda Lieu, RN Outcome: Progressing 11/09/2024 0700 by Durenda Lieu, RN Outcome: Progressing Goal: Cardiovascular complication will be avoided 11/09/2024 0700 by Durenda Lieu, RN Outcome: Progressing 11/09/2024 0700 by Durenda Lieu, RN Outcome: Progressing   Problem: Activity: Goal: Risk for activity intolerance will decrease 11/09/2024 0700 by Durenda Lieu, RN Outcome: Progressing 11/09/2024 0700 by Durenda Lieu, RN Outcome: Progressing   Problem: Nutrition: Goal: Adequate nutrition will be maintained 11/09/2024 0700 by Durenda Lieu, RN Outcome: Progressing 11/09/2024 0700 by Durenda Lieu, RN Outcome: Progressing   "

## 2024-11-10 ENCOUNTER — Other Ambulatory Visit: Payer: Self-pay

## 2024-11-10 ENCOUNTER — Other Ambulatory Visit: Payer: Self-pay | Admitting: Family Medicine

## 2024-11-10 ENCOUNTER — Other Ambulatory Visit (HOSPITAL_COMMUNITY): Payer: Self-pay

## 2024-11-10 ENCOUNTER — Encounter (HOSPITAL_COMMUNITY): Payer: Self-pay | Admitting: Physical Medicine & Rehabilitation

## 2024-11-10 ENCOUNTER — Inpatient Hospital Stay (HOSPITAL_COMMUNITY)

## 2024-11-10 ENCOUNTER — Inpatient Hospital Stay (HOSPITAL_COMMUNITY)
Admission: AD | Admit: 2024-11-10 | Discharge: 2024-11-17 | DRG: 081 | Disposition: A | Discharge status: Home Health | Source: Intra-hospital | Attending: Physical Medicine & Rehabilitation | Admitting: Physical Medicine & Rehabilitation

## 2024-11-10 DIAGNOSIS — E785 Hyperlipidemia, unspecified: Secondary | ICD-10-CM

## 2024-11-10 DIAGNOSIS — F32A Depression, unspecified: Secondary | ICD-10-CM | POA: Diagnosis present

## 2024-11-10 DIAGNOSIS — R531 Weakness: Secondary | ICD-10-CM | POA: Diagnosis present

## 2024-11-10 DIAGNOSIS — G936 Cerebral edema: Secondary | ICD-10-CM | POA: Diagnosis present

## 2024-11-10 DIAGNOSIS — Z803 Family history of malignant neoplasm of breast: Secondary | ICD-10-CM

## 2024-11-10 DIAGNOSIS — R319 Hematuria, unspecified: Secondary | ICD-10-CM

## 2024-11-10 DIAGNOSIS — R5383 Other fatigue: Secondary | ICD-10-CM | POA: Diagnosis present

## 2024-11-10 DIAGNOSIS — Z801 Family history of malignant neoplasm of trachea, bronchus and lung: Secondary | ICD-10-CM

## 2024-11-10 DIAGNOSIS — Z7901 Long term (current) use of anticoagulants: Secondary | ICD-10-CM

## 2024-11-10 DIAGNOSIS — I10 Essential (primary) hypertension: Secondary | ICD-10-CM | POA: Diagnosis present

## 2024-11-10 DIAGNOSIS — Z800 Family history of malignant neoplasm of digestive organs: Secondary | ICD-10-CM

## 2024-11-10 DIAGNOSIS — I4891 Unspecified atrial fibrillation: Secondary | ICD-10-CM

## 2024-11-10 DIAGNOSIS — Z86718 Personal history of other venous thrombosis and embolism: Secondary | ICD-10-CM | POA: Diagnosis not present

## 2024-11-10 DIAGNOSIS — N401 Enlarged prostate with lower urinary tract symptoms: Secondary | ICD-10-CM | POA: Diagnosis present

## 2024-11-10 DIAGNOSIS — Z833 Family history of diabetes mellitus: Secondary | ICD-10-CM

## 2024-11-10 DIAGNOSIS — Z87891 Personal history of nicotine dependence: Secondary | ICD-10-CM | POA: Diagnosis not present

## 2024-11-10 DIAGNOSIS — Z7952 Long term (current) use of systemic steroids: Secondary | ICD-10-CM

## 2024-11-10 DIAGNOSIS — K5900 Constipation, unspecified: Secondary | ICD-10-CM | POA: Diagnosis present

## 2024-11-10 DIAGNOSIS — Z83719 Family history of colon polyps, unspecified: Secondary | ICD-10-CM | POA: Diagnosis not present

## 2024-11-10 DIAGNOSIS — I482 Chronic atrial fibrillation, unspecified: Secondary | ICD-10-CM | POA: Diagnosis present

## 2024-11-10 DIAGNOSIS — M810 Age-related osteoporosis without current pathological fracture: Secondary | ICD-10-CM | POA: Diagnosis present

## 2024-11-10 DIAGNOSIS — Z8249 Family history of ischemic heart disease and other diseases of the circulatory system: Secondary | ICD-10-CM | POA: Diagnosis not present

## 2024-11-10 DIAGNOSIS — R569 Unspecified convulsions: Secondary | ICD-10-CM | POA: Diagnosis not present

## 2024-11-10 DIAGNOSIS — C719 Malignant neoplasm of brain, unspecified: Principal | ICD-10-CM | POA: Diagnosis present

## 2024-11-10 DIAGNOSIS — Z9181 History of falling: Secondary | ICD-10-CM

## 2024-11-10 DIAGNOSIS — Z923 Personal history of irradiation: Secondary | ICD-10-CM

## 2024-11-10 DIAGNOSIS — R350 Frequency of micturition: Secondary | ICD-10-CM | POA: Diagnosis not present

## 2024-11-10 DIAGNOSIS — G40909 Epilepsy, unspecified, not intractable, without status epilepticus: Secondary | ICD-10-CM | POA: Diagnosis present

## 2024-11-10 DIAGNOSIS — N400 Enlarged prostate without lower urinary tract symptoms: Secondary | ICD-10-CM | POA: Diagnosis not present

## 2024-11-10 DIAGNOSIS — D72829 Elevated white blood cell count, unspecified: Secondary | ICD-10-CM | POA: Diagnosis present

## 2024-11-10 DIAGNOSIS — Y842 Radiological procedure and radiotherapy as the cause of abnormal reaction of the patient, or of later complication, without mention of misadventure at the time of the procedure: Secondary | ICD-10-CM | POA: Diagnosis present

## 2024-11-10 DIAGNOSIS — I422 Other hypertrophic cardiomyopathy: Secondary | ICD-10-CM | POA: Diagnosis present

## 2024-11-10 DIAGNOSIS — R059 Cough, unspecified: Secondary | ICD-10-CM | POA: Diagnosis not present

## 2024-11-10 DIAGNOSIS — R051 Acute cough: Secondary | ICD-10-CM | POA: Diagnosis present

## 2024-11-10 DIAGNOSIS — Z808 Family history of malignant neoplasm of other organs or systems: Secondary | ICD-10-CM

## 2024-11-10 DIAGNOSIS — Z79899 Other long term (current) drug therapy: Secondary | ICD-10-CM | POA: Diagnosis not present

## 2024-11-10 DIAGNOSIS — Z8419 Family history of other disorders of kidney and ureter: Secondary | ICD-10-CM

## 2024-11-10 DIAGNOSIS — Z860100 Personal history of colon polyps, unspecified: Secondary | ICD-10-CM

## 2024-11-10 DIAGNOSIS — F419 Anxiety disorder, unspecified: Secondary | ICD-10-CM | POA: Diagnosis present

## 2024-11-10 DIAGNOSIS — G4733 Obstructive sleep apnea (adult) (pediatric): Secondary | ICD-10-CM | POA: Diagnosis present

## 2024-11-10 LAB — CBC
HCT: 42.3 % (ref 39.0–52.0)
Hemoglobin: 14.3 g/dL (ref 13.0–17.0)
MCH: 32.3 pg (ref 26.0–34.0)
MCHC: 33.8 g/dL (ref 30.0–36.0)
MCV: 95.5 fL (ref 80.0–100.0)
Platelets: 305 K/uL (ref 150–400)
RBC: 4.43 MIL/uL (ref 4.22–5.81)
RDW: 13.5 % (ref 11.5–15.5)
WBC: 11.7 K/uL — ABNORMAL HIGH (ref 4.0–10.5)
nRBC: 0.3 % — ABNORMAL HIGH (ref 0.0–0.2)

## 2024-11-10 MED ORDER — APIXABAN 5 MG PO TABS
5.0000 mg | ORAL_TABLET | Freq: Two times a day (BID) | ORAL | Status: DC
  Administered 2024-11-10 – 2024-11-17 (×14): 5 mg via ORAL
  Filled 2024-11-10 (×14): qty 1

## 2024-11-10 MED ORDER — NYSTATIN 100000 UNIT/GM EX POWD: Freq: Two times a day (BID) | CUTANEOUS | Status: DC

## 2024-11-10 MED ORDER — NYSTATIN 100000 UNIT/GM EX POWD
Freq: Two times a day (BID) | CUTANEOUS | Status: DC
  Filled 2024-11-10 (×2): qty 15

## 2024-11-10 MED ORDER — LEVETIRACETAM 250 MG PO TABS
1000.0000 mg | ORAL_TABLET | Freq: Two times a day (BID) | ORAL | Status: DC
  Administered 2024-11-10 – 2024-11-17 (×14): 1000 mg via ORAL
  Filled 2024-11-10 (×14): qty 4

## 2024-11-10 MED ORDER — ATORVASTATIN CALCIUM 40 MG PO TABS
40.0000 mg | ORAL_TABLET | Freq: Every day | ORAL | Status: DC
  Administered 2024-11-11 – 2024-11-17 (×7): 40 mg via ORAL
  Filled 2024-11-10 (×7): qty 1

## 2024-11-10 MED ORDER — CALCIUM CARBONATE 1250 (500 CA) MG PO TABS
1250.0000 mg | ORAL_TABLET | Freq: Two times a day (BID) | ORAL | Status: DC
  Administered 2024-11-10 – 2024-11-17 (×14): 1250 mg via ORAL
  Filled 2024-11-10 (×14): qty 1

## 2024-11-10 MED ORDER — CARVEDILOL 12.5 MG PO TABS
12.5000 mg | ORAL_TABLET | Freq: Two times a day (BID) | ORAL | Status: DC
  Administered 2024-11-10 – 2024-11-17 (×14): 12.5 mg via ORAL
  Filled 2024-11-10 (×14): qty 1

## 2024-11-10 MED ORDER — ACETAMINOPHEN 325 MG PO TABS
650.0000 mg | ORAL_TABLET | Freq: Four times a day (QID) | ORAL | Status: DC | PRN
  Administered 2024-11-13 – 2024-11-16 (×2): 650 mg via ORAL
  Filled 2024-11-10 (×2): qty 2

## 2024-11-10 MED ORDER — MELATONIN 3 MG PO TABS
3.0000 mg | ORAL_TABLET | Freq: Every day | ORAL | Status: DC
  Administered 2024-11-10 – 2024-11-16 (×7): 3 mg via ORAL
  Filled 2024-11-10 (×7): qty 1

## 2024-11-10 MED ORDER — PANTOPRAZOLE SODIUM 40 MG PO TBEC
40.0000 mg | DELAYED_RELEASE_TABLET | Freq: Every day | ORAL | Status: DC
  Administered 2024-11-11 – 2024-11-17 (×7): 40 mg via ORAL
  Filled 2024-11-10 (×7): qty 1

## 2024-11-10 MED ORDER — BENZONATATE 100 MG PO CAPS: 100.0000 mg | ORAL_CAPSULE | Freq: Three times a day (TID) | ORAL | Status: DC | PRN

## 2024-11-10 MED ORDER — QUETIAPINE FUMARATE 25 MG PO TABS
25.0000 mg | ORAL_TABLET | Freq: Every day | ORAL | Status: DC
  Administered 2024-11-10 – 2024-11-16 (×7): 25 mg via ORAL
  Filled 2024-11-10 (×7): qty 1

## 2024-11-10 MED ORDER — OXYCODONE HCL 5 MG PO TABS
5.0000 mg | ORAL_TABLET | Freq: Four times a day (QID) | ORAL | Status: DC | PRN
  Administered 2024-11-10 – 2024-11-13 (×4): 5 mg via ORAL
  Filled 2024-11-10 (×4): qty 1

## 2024-11-10 MED ORDER — DEXAMETHASONE 4 MG PO TABS
4.0000 mg | ORAL_TABLET | Freq: Two times a day (BID) | ORAL | Status: DC
  Administered 2024-11-10 – 2024-11-17 (×14): 4 mg via ORAL
  Filled 2024-11-10 (×14): qty 1

## 2024-11-10 MED ORDER — DEXAMETHASONE 4 MG PO TABS: 4.0000 mg | ORAL_TABLET | Freq: Two times a day (BID) | ORAL | Status: DC

## 2024-11-10 MED ORDER — BENZONATATE 100 MG PO CAPS
100.0000 mg | ORAL_CAPSULE | Freq: Three times a day (TID) | ORAL | Status: DC | PRN
  Administered 2024-11-10: 100 mg via ORAL
  Filled 2024-11-10: qty 1

## 2024-11-10 MED ORDER — POLYETHYLENE GLYCOL 3350 17 G PO PACK
17.0000 g | PACK | Freq: Every day | ORAL | Status: DC
  Administered 2024-11-12 – 2024-11-17 (×6): 17 g via ORAL
  Filled 2024-11-10 (×7): qty 1

## 2024-11-10 NOTE — H&P (Signed)
 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 Rosslyn complicated by seizures maintained on Keppra  and followed by medical oncology Dr. [name] and received CIR 08/04/2024 - 08/09/2024 discharged to home ambulating contact-guard assist with walker.  Per chart review patient lives with spouse.  1 level home one-step to entry.  Modified independent with rolling walker prior to admission.  Wife did assist with ADLs.  Presented 11/03/2024 with generalized weakness/lethargy as well as increasing left-sided weakness over several days .  Wife had reported fall day prior to admission..  It was noted patient recently taken off of steroids which were being used to treat his glioblastoma.  CT/MRI of the brain showed slightly increased size of the right parietal mass with increased involvement of the right splenium of the corpus callosum.  Increased associated vasogenic edema in the right parietal lobe.  Soft tissue swelling in the left facial soft tissues concerning for possible contusion.  Admission chemistries unremarkable except total protein 5.9, albumin 3.4, total bilirubin 1.8, urinalysis negative nitrite, troponin 60-47-66.  Hospital course follow-up medical oncology Dr. [name] suspect symptoms due to postradiation inflammation and Decadron  has been resumed indefinitely 4 mg twice daily and strength overall has clearly improved.  Plan to discuss chemotherapy as outpatient.   Chronic Eliquis  ongoing for history of atrial fibrillation.  Keppra  as indicated 1000 mg twice daily.  Hospital course intermittent bouts of hematuria felt to be related to chronic Eliquis  latest urine study unremarkable.  Pt reports some urinary urgency, UA yesterday did not indicate infection.  Elevated troponin felt to be related to demand ischemia per curb consult with cardiology services with noted history of atypical variant hypertrophic cardiomyopathy. Reports increased occasional cough past 2 days.  Palliative care consulted to establish goals of care.  Tolerating a regular consistency diet.  Therapy evaluations completed due to patient's decreased functional mobility was admitted for a comprehensive rehab program.   Review of Systems  Constitutional:  Negative for chills and fever.  HENT:  Negative for hearing loss.   Eyes:  Negative for blurred vision and double vision.  Respiratory:  Positive for cough. Negative for shortness of breath and wheezing.   Cardiovascular:  Negative for chest pain, leg swelling and PND.  Gastrointestinal:  Negative for abdominal pain, constipation, nausea and vomiting.  Genitourinary:  Positive for dysuria, hematuria and urgency. Negative for flank pain.  Musculoskeletal:  Positive for falls, joint pain and myalgias.  Skin:  Negative for rash.  Neurological:  Positive for dizziness, speech change, seizures and weakness. Negative for sensory change.  Psychiatric/Behavioral:  Positive for depression. The patient has insomnia.        Anxiety  All other systems reviewed and are negative.      Past Medical History:  Diagnosis Date   Anxiety     Arthritis      fingers   Chronic atrial fibrillation (HCC)     Depression     Family history of breast cancer     Family history of cancer of  male genital organ     Family history of gene mutation      BRIP1   Family history of malignant neoplasm of gastrointestinal tract     History of radiation therapy       08-23-24 - 09-12-24 Brain (parietal lobe) Dr. Lauraine Golden, MD   Hyperlipidemia     Hypertension     Personal history of colonic polyps 11/12/2006   Sleep apnea               Past Surgical History:  Procedure Laterality Date   2-D echocardiogram   07/22/2010    Ejection fraction greater than 55%. Left atrium moderately dilated. Right atrium moderately dilated. 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 Mild to Moderate MR. Mild to moderate TR.   APPLICATION OF CRANIAL NAVIGATION Right 07/17/2024    Procedure: COMPUTER-ASSISTED NAVIGATION, FOR CRANIAL PROCEDURE;  Surgeon: Rosslyn Dino HERO, MD;  Location: [facility] OR;  Service: Neurosurgery;  Laterality: Right;  CRANIAL NAVIGATION   ATRIAL ABLATION SURGERY   11/09/2002   EP IMPLANTABLE DEVICE N/A 02/11/2016    Procedure: Loop Recorder Insertion;  Surgeon: Jerel Balding, MD;  Location: [facility] INVASIVE CV LAB;  Service: Cardiovascular;  Laterality: N/A;   Exercise Myoview stress test   07/08/2000    Nonischemic low-risk.   HIP FRACTURE SURGERY   1970s   IR GENERIC HISTORICAL   01/21/2017    IR RADIOLOGIST EVAL & MGMT 01/21/2017 [facility]   STERIOTACTIC STIMULATOR INSERTION Right 07/17/2024    Procedure: OPEN CRANIOTOMY FOR BIOPSY;  Surgeon: Rosslyn Dino HERO, MD;  Location: [facility] OR;  Service: Neurosurgery;  Laterality: Right;  RIGHT STEREOTACTIC BRAIN BIOPSY             Family History  Problem Relation Age of Onset   Dementia Mother     Heart disease Father     Diabetes Father     Other Sister          BRIP1 gene mutation   Kidney disease Brother     Colon cancer Maternal Uncle     Lung cancer Paternal Aunt          d. >50   Cancer Paternal Aunt          unknown type, d. >50   Cancer Cousin 17        gynecologic (paternal first cousin)   Colon cancer Nephew 27        arising in colon polyp   Cancer Niece 43        gynecologic; niece in her 70's   Breast cancer Niece 33   Other Niece          BRIP1 gene mutation   Cancer Paternal  Great-grandmother          abdominal cancer (PGF's mother) great grandmother        Social History:  reports that he has quit smoking. His smoking use included cigarettes. He has never used smokeless tobacco. He reports that he does not drink alcohol and does not use drugs. Allergies: [Allergies]  [Allergies] No Known Allergies       Medications Prior to Admission  Medication Sig Dispense Refill   acetaminophen  (TYLENOL ) 325 MG tablet Take 1-2 tablets (325-650 mg total) by mouth every 4 (four) hours as needed for mild pain (pain score 1-3).       apixaban  (ELIQUIS ) 5 MG TABS tablet Take 1 tablet (5 mg total) by mouth 2 (two) times daily. 60 tablet 0   atorvastatin  (LIPITOR)  40 MG tablet Take 1 tablet (40 mg total) by mouth daily. 30 tablet 0   calcium  carbonate (OSCAL) 1500 (600 Ca) MG TABS tablet Take 1 tablet (1,500 mg total) by mouth 2 (two) times daily with a meal. 60 tablet 2   carvedilol  (COREG ) 12.5 MG tablet Take 1 tablet (12.5 mg total) by mouth 2 (two) times daily. 60 tablet 3   Cyanocobalamin (VITAMIN B-12 PO) Take 1 capsule by mouth daily.       gabapentin  (NEURONTIN ) 100 MG capsule Take 1 capsule (100 mg total) by mouth 3 (three) times daily. (Patient taking differently: Take 100 mg by mouth 3 (three) times daily as needed.) 90 capsule 2   levETIRAcetam  (KEPPRA ) 1000 MG tablet Take 1 tablet (1,000 mg total) by mouth 2 (two) times daily. 60 tablet 2   Magnesium  Oxide -Mg Supplement 250 MG TABS Take 1 tablet (250 mg total) by mouth daily in the afternoon. 30 tablet 0   Magnesium  Oxide 250 MG TABS Take 1 tablet (250 mg total) by mouth daily in the afternoon. 30 tablet 0   melatonin 3 MG TABS tablet Take 1 tablet (3 mg total) by mouth at bedtime. 30 tablet 2   Multiple Vitamin (MULTIVITAMIN ADULT PO) Take 1 tablet by mouth daily.       ondansetron  (ZOFRAN ) 8 MG tablet Take 1 tablet (8 mg total) by mouth every 8 (eight) hours as needed for nausea or vomiting. May take 30-60 minutes  prior to Temodar  administration if nausea/vomiting occurs as needed. 30 tablet 1   oxyCODONE  (OXY IR/ROXICODONE ) 5 MG immediate release tablet Take 1 tablet (5 mg total) by mouth every 6 (six) hours as needed for moderate pain (pain score 4-6). 30 tablet 0   pantoprazole  (PROTONIX ) 40 MG tablet Take 1 tablet (40 mg total) by mouth daily. 90 tablet 0   polyethylene glycol (MIRALAX  / GLYCOLAX ) 17 g packet Take 17 g by mouth daily.       QUEtiapine  (SEROQUEL ) 25 MG tablet Take 1 tablet (25 mg total) by mouth daily. 90 tablet 0   tamsulosin  (FLOMAX ) 0.4 MG CAPS capsule Take 1 capsule (0.4 mg total) by mouth daily. (Patient taking differently: Take 0.4 mg by mouth daily as needed.) 90 capsule 0   temozolomide  (TEMODAR ) 140 MG capsule Take 2 capsules (280 mg total) by mouth daily. Take for 5 days on, 23 days off. Repeat every 28 days. May take on an empty stomach to decrease nausea & vomiting. 10 capsule 0   furosemide  (LASIX ) 20 MG tablet Take 1 tablet (20 mg total) by mouth daily as needed for up to 14 days. Stop taking if swelling resolves (Patient not taking: Reported on 10/17/2024) 14 tablet 0   LORazepam  (ATIVAN ) 0.5 MG tablet Take 1 tablet 30 minutes prior to radiation procedures or MRIs as needed to help with anxiety. (OK to take 2 tablets before MRIs). (Patient not taking: Reported on 11/03/2024) 18 tablet 0   Nystatin  (GERHARDT'S BUTT CREAM) CREA Apply to affected area (Patient not taking: Reported on 11/03/2024) 60 each 0              Home: Home Living Family/patient expects to be discharged to:: Private residence Living Arrangements: Spouse/significant other Available Help at Discharge: Family, Available 24 hours/day Type of Home: House Home Access: Ramped entrance Entrance Stairs-Number of Steps: 1 threshold step Entrance Stairs-Rails: None Home Layout: One level Bathroom Shower/Tub: Engineer, Manufacturing Systems: Handicapped height Bathroom Accessibility: Yes Home Equipment:  Shower seat,  Rolling Environmental Consultant (2 wheels), Wheelchair - manual, BSC/3in1  Lives With: Spouse   Functional History: Prior Function Prior Level of Function : Independent/Modified Independent, History of Falls (last six months) Mobility Comments: ambulatory short distances to bathroom with RW ADLs Comments: assist for bathing, ind with toileting, ind with dressing and grooming tasks   Functional Status:  Mobility: Bed Mobility Overal bed mobility: Needs Assistance Bed Mobility: Rolling, Sidelying to Sit Rolling: Supervision Sidelying to sit: Contact guard assist, HOB elevated, Used rails Supine to sit: Contact guard Sit to supine: Contact guard assist General bed mobility comments: increased time and verbal cues Transfers Overall transfer level: Needs assistance Equipment used: Rolling walker (2 wheels) Transfers: Sit to/from Stand Sit to Stand: Min assist General transfer comment: cues for hand placement and min assist to power up and occasional cues for walker management and keeping close distance with RW Ambulation/Gait Ambulation/Gait assistance: +2 safety/equipment, Min assist Gait Distance (Feet): 25 Feet (seated rest, 25) Assistive device: Rolling walker (2 wheels) Gait Pattern/deviations: Trunk flexed, Wide base of support, Shuffle, Decreased stride length, Step-through pattern General Gait Details: constant cues and assist for proximity to RW, upright posture, and incr step length. Chair follow for safety Gait velocity: decreased Pre-gait activities: see above in transfers; no +2 for chair follow   ADL: ADL Overall ADL's : Needs assistance/impaired Eating/Feeding: Set up Grooming: Wash/dry hands, Wash/dry face, Oral care, Brushing hair, Contact guard assist, Standing Upper Body Bathing: Minimal assistance, Standing Lower Body Bathing: Moderate assistance, Sit to/from stand Upper Body Dressing : Moderate assistance Lower Body Dressing: Moderate assistance Toilet Transfer:  Moderate assistance, +2 for physical assistance, Ambulation, Rolling walker (2 wheels) Toileting- Clothing Manipulation and Hygiene: Moderate assistance Functional mobility during ADLs: Moderate assistance, Rolling walker (2 wheels), +2 for physical assistance General ADL Comments: occasional cues for sequencing   Cognition: Cognition Orientation Level: Oriented X4 Cognition Arousal: Alert Behavior During Therapy: Flat affect   Physical Exam: Blood pressure (!) 146/92, pulse 71, temperature 98.1 F (36.7 C), temperature source Oral, resp. rate 18, height 5' 8 (1.727 m), weight 72.6 kg, SpO2 98%.   General: No apparent distress HEENT: Head is normocephalic, atraumatic, sclera anicteric, oral mucosa dry Heart: Reg rate and rhythm. No murmurs rubs or gallops Chest: CTA bilaterally without wheezes, rales, or rhonchi; no distress Abdomen: Soft, non-tender, non-distended, bowel sounds positive. Extremities: No clubbing, cyanosis, or edema. Psych: Pt's affect is appropriate. Pt is cooperative Skin: Clean and intact without signs of breakdown, abrasion on his left thigh-reports happened during MRI Neuro:     Mental Status: AAOx person and place, not date, Recalls 1/3 words 5 min later, slightly delayed responses   Speech/Languate: Naming and repetition intact, fluent, speech a little dysarthric,  follows simple commands     CRANIAL NERVES: II: PERRL. Visual fields full III, IV, VI: EOM intact,  R eye ptosis- chronic  V: normal sensation bilaterally VII: no asymmetry VIII: normal hearing to speech IX, X: normal palatal elevation XI: head turn intact XII: Tongue midline     MOTOR: RUE: 4+/5 Deltoid, 4/5 Biceps, 4/5 Triceps,4/5 Grip LUE: 4+/5 Deltoid, 4/5 Biceps, 4/5 Triceps, 4/5 Grip RLE: HF 4-/5, KE 4/5, ADF 4/5, APF 4/5 LLE: HF 4-/5, KE 4-/5, ADF 4-/5, APF 4-/5     REFLEXES: No hyperreflexia noted, no Hoffman's, no clonus   SENSORY: Normal to touch all 4 extremities    Coordination: Normal finger to nose   IV R arm looks ok    Lab Results Last 48  Hours        Results for orders placed or performed during the hospital encounter of 11/03/24 (from the past 48 hours)  Urinalysis, Routine w reflex microscopic -Urine, Clean Catch     Status: Abnormal    Collection Time: 11/09/24  1:16 PM  Result Value Ref Range    Color, Urine YELLOW YELLOW    APPearance CLEAR CLEAR    Specific Gravity, Urine 1.021 1.005 - 1.030    pH 5.0 5.0 - 8.0    Glucose, UA >=500 (A) NEGATIVE mg/dL    Hgb urine dipstick NEGATIVE NEGATIVE    Bilirubin Urine NEGATIVE NEGATIVE    Ketones, ur NEGATIVE NEGATIVE mg/dL    Protein, ur 30 (A) NEGATIVE mg/dL    Nitrite NEGATIVE NEGATIVE    Leukocytes,Ua NEGATIVE NEGATIVE    RBC / HPF 0-5 0 - 5 RBC/hpf    WBC, UA 0-5 0 - 5 WBC/hpf    Bacteria, UA NONE SEEN NONE SEEN    Squamous Epithelial / HPF 0-5 0 - 5 /HPF    Mucus PRESENT        Comment: Performed at [facility], [address], [place], [place] [address]  CBC     Status: Abnormal    Collection Time: 11/10/24  2:20 AM  Result Value Ref Range    WBC 11.7 (H) 4.0 - 10.5 K/uL    RBC 4.43 4.22 - 5.81 MIL/uL    Hemoglobin 14.3 13.0 - 17.0 g/dL    HCT 57.6 60.9 - 47.9 %    MCV 95.5 80.0 - 100.0 fL    MCH 32.3 26.0 - 34.0 pg    MCHC 33.8 30.0 - 36.0 g/dL    RDW 86.4 88.4 - 84.4 %    Platelets 305 150 - 400 K/uL    nRBC 0.3 (H) 0.0 - 0.2 %      Comment: Performed at [facility], [address], [place], [place] [address]      Imaging Results (Last 48 hours)  No results found.         Blood pressure (!) 146/92, pulse 71, temperature 98.1 F (36.7 C), temperature source Oral, resp. rate 18, height 5' 8 (1.727 m), weight 72.6 kg, SpO2 98%.   Medical Problem List and Plan: 1. Functional deficits secondary to glioblastoma with vasogenic edema suspect symptoms due to postradiation inflammation with history of right craniotomy 08/06/2024.  Chronic Decadron  resumed.   Plan to address chemotherapy as outpatient per medical oncology Dr. [name]             -patient may shower             -ELOS/Goals: PT/OT/SLP supervision             -Admit to CIR 2.  Antithrombotics: -DVT/anticoagulation:  Pharmaceutical: Eliquis              -antiplatelet therapy: N/A 3. Pain Management: Oxycodone  as needed 4. Mood/Behavior/Sleep: Melatonin 3 mg nightly.  Provide emotional support             -antipsychotic agents: Seroquel  25 mg nightly 5. Neuropsych/cognition: This patient is not capable of making decisions on his own behalf. 6. Skin/Wound Care: Routine skin checks 7. Fluids/Electrolytes/Nutrition: Routine N and outs with follow-up chemistries. 8.  Seizure disorder.  Keppra  1000 mg twice daily 9.  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  Continue Eliquis .  Cardiac rate controlled.  Follow-up cardiology services. Daily weight.  10.  Hypertension.  Coreg  12.5 mg twice daily.  Monitor with increased mobility 11.  Hyperlipidemia.  Lipitor 12.  Constipation.  MiraLAX  daily. LBM 11/10/24 13.  BPH/history of hematuria.  Check PVR.  Patient on Flomax . U/A 11/09/24 did not indicate infection  prior to admission but only taking it as needed 14.  Osteoporosis.  Os-Cal 1250 mg twice daily 15.  Cough. Check CXR   Toribio JINNY Pitch, PA-C 11/10/2024   I have personally performed a face to face diagnostic evaluation of this patient and formulated the key components of the plan.  Additionally, I have personally reviewed laboratory data, imaging studies, as well as relevant notes and concur with the physician assistant's documentation above.   The patient's status has not changed from the original H&P.  Any changes in documentation from the acute care chart have been noted above.   Murray Collier, MD "

## 2024-11-10 NOTE — Progress Notes (Signed)
 Inpatient Rehab Admissions Coordinator:  There is a bed available for pt in CIR today. Dr. Manon aware and in agreement. Pt, pt's wife [name], NSG and TOC made aware.   Tinnie Yvone Cohens, MS, CCC-SLP Admissions Coordinator [phone]

## 2024-11-10 NOTE — TOC Transition Note (Signed)
 Transition of Care [facility]) - Discharge Note   Patient Details  Name: Wayne Molina MRN: [id number] Date of Birth: 19-Aug-1956  Transition of [facility] CM/SW Contact:  Almarie CHRISTELLA Goodie, LCSW Phone Number: 11/10/2024, 10:55 AM   Clinical Narrative:   Patient discharging to CIR today, no further inpatient care management needs at this time.    Final next level of care: IP Rehab Facility Barriers to Discharge: Barriers Resolved   Patient Goals and CMS Choice            Discharge Placement                       Discharge Plan and Services Additional resources added to the After Visit Summary for                                       Social Drivers of Health (SDOH) Interventions SDOH Screenings   Food Insecurity: No Food Insecurity (11/03/2024)  Housing: Low Risk (11/03/2024)  Transportation Needs: No Transportation Needs (11/03/2024)  Recent Concern: Transportation Needs - Unmet Transportation Needs (09/18/2024)  Utilities: Not At Risk (11/03/2024)  Alcohol Screen: Low Risk (04/16/2023)  Depression (PHQ2-9): Low Risk (10/09/2024)  Recent Concern: Depression (PHQ2-9) - High Risk (09/18/2024)  Financial Resource Strain: Low Risk (03/22/2024)  Physical Activity: Sufficiently Active (09/18/2024)  Social Connections: Moderately Integrated (11/03/2024)  Stress: No Stress Concern Present (09/18/2024)  Tobacco Use: Medium Risk (11/03/2024)  Health Literacy: Adequate Health Literacy (09/18/2024)     Readmission Risk Interventions     No data to display

## 2024-11-10 NOTE — Progress Notes (Signed)
 "  Signed      Expand All Collapse All PMR Admission Coordinator Pre-Admission Assessment   Patient: Wayne Molina is an 69 y.o., male MRN: [id number] DOB: 06/11/1956 Height: 5' 8 (172.7 cm) Weight: 72.6 kg   Insurance Information HMO:     PPO:      PCP:      IPA:      80/20:      OTHER:  PRIMARY: [facility]      Policy#: [id number]      Subscriber: patient CM Name: TBD      Phone#: [phone]     Fax#: [phone] Pre-Cert#: [id number] Approval received from [employer]rom Bustillo via fax on 11/08/24. Pt approved for 7 days. Pt approved from 11/08/24-11/15/24.      Employer: Retired Benefits:  Phone #: [phone]     Name: checked on [email or web address] and verified Eff. Date: 11/10/23     Deduct: $0      Out of Pocket Max: [phone] (met [phone])      Life Max: n/a CIR: $395 copay for days 1-5      SNF: $0 for days 1-20; $214 for days 21-60; $0 for days 61-100 Outpatient:      Co-Pay: $45/visit Home Health: 100%      Co-Pay: none DME: 80%     Co-Pay: 20% Providers: in-network  SECONDARY: Tricare for Life      Policy#: [id number]     Phone#: [phone]   Financial Counselor:       Phone#:    The Data Collection Information Summary for patients in Inpatient Rehabilitation Facilities with attached Privacy Act Statement-Health Care Records was provided and verbally reviewed with: Family   Emergency Contact Information Contact Information       Name Relation Home Work Mobile    Hunter Spouse     [phone]    Wheelwright,Rodney Brother [phone]        Shuman,Angelina Daughter [phone]   [phone]         Other Contacts   None on File        Current Medical History  Patient Admitting Diagnosis: debility History of Present Illness: Pt is a 69 year old male with medical hx significant for: brain CA/glioblastoma, HTN, hyperlipidemia, chronic A-fib. Pt presented to [facility] on 11/03/24 d/t generalized weakness x1 week. Pt also with decreased PO intake. Pt fell  on day of presentation. CT showed slightly increased size of right parietal mass with increased involvement of right splenium of corpus callosum. Increased vasogenic edema in right parietal lobe. Given Decadron . MRI showed either progression of disease or post-treatment effect. Oncology consulted and recommended continuing Decadron  and no inpatient chemotherapy. Palliative Medicine consulted. UA on 1/1/ negative for infection. Therapy evaluations completed and CIR recommended d/t pt's deficits with functional mobility.    Patient's medical record from [facility] has been reviewed by the rehabilitation admission coordinator and physician.   Past Medical History      Past Medical History:  Diagnosis Date   Anxiety     Arthritis      fingers   Chronic atrial fibrillation (HCC)     Depression     Family history of breast cancer     Family history of cancer of male genital organ     Family history of gene mutation      BRIP1   Family history of malignant neoplasm of gastrointestinal tract     History of radiation therapy  08-23-24 - 09-12-24 Brain (parietal lobe) Dr. Lauraine Golden, MD   Hyperlipidemia     Hypertension     Personal history of colonic polyps 11/12/2006   Sleep apnea            Has the patient had major surgery during 100 days prior to admission? No   Family History   family history includes Breast cancer (age of onset: 40) in his niece; Cancer in his paternal aunt and paternal great-grandmother; Cancer (age of onset: 64) in his cousin; Cancer (age of onset: 37) in his niece; Colon cancer in his maternal uncle; Colon cancer (age of onset: 31) in his nephew; Dementia in his mother; Diabetes in his father; Heart disease in his father; Kidney disease in his brother; Lung cancer in his paternal aunt; Other in his niece and sister.   Current Medications [Current Medications]  [Current Medications]    Current Facility-Administered Medications:    acetaminophen   (TYLENOL ) tablet 500 mg, 500 mg, Oral, Q6H, Baker, Amelia G, DO, 500 mg at 11/10/24 9341   apixaban  (ELIQUIS ) tablet 5 mg, 5 mg, Oral, BID, McIntyre, Brittany J, MD, 5 mg at 11/10/24 9074   atorvastatin  (LIPITOR) tablet 40 mg, 40 mg, Oral, Daily, [name], MD, 40 mg at 11/10/24 9074   benzonatate  (TESSALON ) capsule 100 mg, 100 mg, Oral, TID PRN, Suknaim, Kulkaew B, DO, 100 mg at 11/10/24 9341   calcium  carbonate (OS-CAL - dosed in mg of elemental calcium ) tablet 1,250 mg, 1,250 mg, Oral, BID WC, [name], MD, 1,250 mg at 11/10/24 9074   carvedilol  (COREG ) tablet 12.5 mg, 12.5 mg, Oral, BID, Zheng, Jacky, MD, 12.5 mg at 11/10/24 9074   dexamethasone  (DECADRON ) injection 4 mg, 4 mg, Intravenous, BID, Baloch, Mahnoor, MD, 4 mg at 11/10/24 9074   levETIRAcetam  (KEPPRA ) tablet 1,000 mg, 1,000 mg, Oral, BID, McIntyre, Brittany J, MD, 1,000 mg at 11/10/24 9074   melatonin tablet 3 mg, 3 mg, Oral, QHS, Zheng, Jacky, MD, 3 mg at 11/09/24 2206   nystatin  (MYCOSTATIN /NYSTOP ) topical powder, , Topical, BID, Baker, Raguel MATSU, DO, Given at 11/10/24 9074   oxyCODONE  (Oxy IR/ROXICODONE ) immediate release tablet 5 mg, 5 mg, Oral, Q6H PRN, [name], MD, 5 mg at 11/09/24 2206   pantoprazole  (PROTONIX ) EC tablet 40 mg, 40 mg, Oral, Daily, [name], MD, 40 mg at 11/10/24 9074   polyethylene glycol (MIRALAX  / GLYCOLAX ) packet 17 g, 17 g, Oral, Daily, [name], MD, 17 g at 11/09/24 1046   QUEtiapine  (SEROQUEL ) tablet 25 mg, 25 mg, Oral, QHS, Zheng, Jacky, MD, 25 mg at 11/09/24 2206   sodium chloride  flush (NS) 0.9 % injection 3 mL, 3 mL, Intravenous, Q12H, [name], MD, 3 mL at 11/10/24 9073    Patients Current Diet:  Diet Order                  Diet regular Room service appropriate? Yes; Fluid consistency: Thin  Diet effective now                         Precautions / Restrictions Precautions Precautions: Fall Restrictions Weight  Bearing Restrictions Per Provider Order: No    Has the patient had 2 or more falls or a fall with injury in the past year? Yes   Prior Activity Level Limited Community (1-2x/wk): gets out of house ~2 days/week   Prior Functional Level Self Care: Did the patient need help bathing,  dressing, using the toilet or eating? Needed some help   Indoor Mobility: Did the patient need assistance with walking from room to room (with or without device)? Independent   Stairs: Did the patient need assistance with internal or external stairs (with or without device)? Unknown (avoids stairs)   Functional Cognition: Did the patient need help planning regular tasks such as shopping or remembering to take medications? Dependent   Patient Information Are you of Hispanic, Latino/a,or Spanish origin?: X. Patient unable to respond, A. No, not of Hispanic, Latino/a, or Spanish origin What is your race?: X. Patient unable to respond, A. White Do you need or want an interpreter to communicate with a doctor or health care staff?: 9. Unable to respond Patient information obtained via proxy : wife answered questions (Pt does not need an interpreter)   Patient's Response To:  Health Literacy and Transportation Is the patient able to respond to health literacy and transportation needs?: No Health Literacy - How often do you need to have someone help you when you read instructions, pamphlets, or other written material from your doctor or pharmacy?: Patient unable to respond Health Literacy and Transportation obtained via proxy: wife answered questions (Pt has not missed any appointments d/t transportation issues)   Journalist, Newspaper / Equipment Home Equipment: Shower seat, Agricultural Consultant (2 wheels), Wheelchair - manual, BSC/3in1   Prior Device Use: Indicate devices/aids used by the patient prior to current illness, exacerbation or injury? Walker   Current Functional Level Cognition   Orientation Level:  Oriented X4    Extremity Assessment (includes Sensation/Coordination)   Upper Extremity Assessment: Generalized weakness  Lower Extremity Assessment: Generalized weakness     ADLs   Overall ADL's : Needs assistance/impaired Eating/Feeding: Set up Grooming: Wash/dry hands, Wash/dry face, Oral care, Brushing hair, Contact guard assist, Standing Upper Body Bathing: Minimal assistance, Standing Lower Body Bathing: Moderate assistance, Sit to/from stand Upper Body Dressing : Moderate assistance Lower Body Dressing: Moderate assistance Toilet Transfer: Moderate assistance, +2 for physical assistance, Ambulation, Rolling walker (2 wheels) Toileting- Clothing Manipulation and Hygiene: Moderate assistance Functional mobility during ADLs: Moderate assistance, Rolling walker (2 wheels), +2 for physical assistance General ADL Comments: occasional cues for sequencing     Mobility   Overal bed mobility: Needs Assistance Bed Mobility: Rolling, Sidelying to Sit Rolling: Supervision Sidelying to sit: Contact guard assist, HOB elevated, Used rails Supine to sit: Contact guard Sit to supine: Contact guard assist General bed mobility comments: increased time and verbal cues     Transfers   Overall transfer level: Needs assistance Equipment used: Rolling walker (2 wheels) Transfers: Sit to/from Stand Sit to Stand: Min assist General transfer comment: cues for hand placement and min assist to power up and occasional cues for walker management and keeping close distance with RW     Ambulation / Gait / Stairs / Wheelchair Mobility   Ambulation/Gait Ambulation/Gait assistance: +2 safety/equipment, Min assist Gait Distance (Feet): 25 Feet (seated rest, 25) Assistive device: Rolling walker (2 wheels) Gait Pattern/deviations: Trunk flexed, Wide base of support, Shuffle, Decreased stride length, Step-through pattern General Gait Details: constant cues and assist for proximity to RW, upright posture, and  incr step length. Chair follow for safety Gait velocity: decreased Pre-gait activities: see above in transfers; no +2 for chair follow     Posture / Balance Dynamic Sitting Balance Sitting balance - Comments: EOB Balance Overall balance assessment: Needs assistance Sitting-balance support: No upper extremity supported, Feet supported Sitting balance-Leahy Scale: Fair Sitting  balance - Comments: EOB Standing balance support: Single extremity supported, Bilateral upper extremity supported, During functional activity Standing balance-Leahy Scale: Poor Standing balance comment: one extremity support when standing at sink for self care     Special considerations/life events  Skin Abrasion: buttocks, knee; Rash: groin/left    Previous Home Environment (from acute therapy documentation) Living Arrangements: Spouse/significant other  Lives With: Spouse Available Help at Discharge: Family, Available 24 hours/day Type of Home: House Home Layout: One level Home Access: Ramped entrance Entrance Stairs-Rails: None Entrance Stairs-Number of Steps: 1 threshold step Bathroom Shower/Tub: Engineer, Manufacturing Systems: Handicapped height Bathroom Accessibility: Yes How Accessible: Accessible via walker Home Care Services: No   Discharge Living Setting Plans for Discharge Living Setting: Patient's home Type of Home at Discharge: House Discharge Home Layout: One level Discharge Home Access: Ramped entrance Discharge Bathroom Shower/Tub: Tub/shower unit Discharge Bathroom Toilet: Handicapped height Discharge Bathroom Accessibility: Yes How Accessible: Accessible via walker Does the patient have any problems obtaining your medications?: No   Social/Family/Support Systems Anticipated Caregiver: [name] (wife) Anticipated Caregiver's Contact Information: [phone] Caregiver Availability: 24/7 Discharge Plan Discussed with Primary Caregiver: Yes Is Caregiver In Agreement with Plan?: Yes    Goals Patient/Family Goal for Rehab: Supervision: PT/OT/ST Expected length of stay: 7-10 days Pt/Family Agrees to Admission and willing to participate: Yes Program Orientation Provided & Reviewed with Pt/Caregiver Including Roles  & Responsibilities: Yes   Decrease burden of Care through IP rehab admission: NA   Possible need for SNF placement upon discharge: Not anticipated   Patient Condition: This patient's medical and functional status has changed since the consult dated: 11/07/24 in which the Rehabilitation Physician determined and documented that the patient's condition is appropriate for intensive rehabilitative care in an inpatient rehabilitation facility. See History of Present Illness (above) for medical update. Functional changes are: Pt currently Min A with mobility and Min-Mod A with ADLs. Patient's medical and functional status update has been discussed with the Rehabilitation physician and patient remains appropriate for inpatient rehabilitation. Will admit to inpatient rehab today.   Preadmission Screen Completed By:  Tinnie SHAUNNA Yvone Delayne, 11/10/2024 10:07 AM ______________________________________________________________________   Discussed status with Dr. Urbano on 11/10/2024  at 10:07 AM and received approval for admission today.   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  Does the need for close, 24 hr/day Medical supervision in concert with the patient's rehab needs make it unreasonable for this patient to be served in a less intensive setting? Yes Co-Morbidities requiring supervision/potential complications: constipation, HTN, hld, BPH, osteoporosis, afib, cardiomyopathy Due to bladder management, bowel management, safety, skin/wound care, disease management,  medication administration, pain management, and patient education, does the patient require 24 hr/day rehab nursing? Yes Does the patient require coordinated care of a physician, rehab nurse, PT, OT, and SLP to address physical and functional deficits in the context of the above medical diagnosis(es)? Yes Addressing deficits in the following areas: balance, endurance, locomotion, strength, transferring, bowel/bladder control, bathing, dressing, feeding, grooming, toileting, cognition, speech, language, and psychosocial support Can the patient actively participate in an intensive therapy program of at least 3 hrs of therapy 5 days a week? Yes The potential for patient to make measurable gains while on inpatient rehab is excellent Anticipated functional outcomes upon discharge from inpatient rehab: supervision PT, supervision OT, supervision SLP Estimated rehab  length of stay to reach the above functional goals is: 7-10 Anticipated discharge destination: Home 10. Overall Rehab/Functional Prognosis: excellent     MD Signature: Murray Collier             Revision History  Date/Time User Provider Type Action  11/10/2024  3:08 PM Collier Murray, MD Physician Sign  11/10/2024 11:00 AM Collier Murray, MD Physician Share  11/10/2024 10:15 AM Yvone Delayne Tinnie SHAUNNA, CCC-SLP Rehab Admission Coordinator Share  11/10/2024 10:07 AM Yvone Delayne Tinnie SHAUNNA, CCC-SLP Rehab Admission Coordinator Share  11/08/2024  2:31 PM Duanne Lovett HERO, RN Rehab Admission Coordinator Share  11/06/2024  3:18 PM Yvone Delayne Tinnie SHAUNNA, CCC-SLP Rehab Admission Coordinator Share   "

## 2024-11-10 NOTE — Progress Notes (Signed)
 Inpatient Rehabilitation Admission Medication Review by a Pharmacist  A complete drug regimen review was completed for this patient to identify any potential clinically significant medication issues.  High Risk Drug Classes Is patient taking? Indication by Medication  Antipsychotic Yes Seroquel  - mood  Anticoagulant Yes Apixaban  - afib  Antibiotic No   Opioid Yes Oxycodone  - pain  Antiplatelet No   Hypoglycemics/insulin  No   Vasoactive Medication Yes Carvedilol  - HTN, afib  Chemotherapy No   Other Yes Nystatin  - fungal infection Atorvastatin  - cholesterol Benzonatate  - cough Oscal - supplement Dexamethasone  - brain swelling Keppra  - seizures PEG - constipation Pantoprazole - reflux  Acetaminophen - pain   Melatonin - insomnia     Type of Medication Issue Identified Description of Issue Recommendation(s)  Drug Interaction(s) (clinically significant)     Duplicate Therapy     Allergy     No Medication Administration End Date     Incorrect Dose     Additional Drug Therapy Needed     Significant med changes from prior encounter (inform family/care partners about these prior to discharge). PTA Temodar  on hold per Oncology Communicate relevant medication changes to patient/family members at discharge from CIR.   Restart or discontinue PTA meds not resumed in CIR at discharge if clinically indicated.   Other       Clinically significant medication issues were identified that warrant physician communication and completion of prescribed/recommended actions by midnight of the next day:  No  Name of provider notified for urgent issues identified:   Provider Method of Notification:     Pharmacist comments:   Time spent performing this drug regimen review (minutes):  20  Rocky Slade, PharmD, BCPS 11/10/2024 5:03 PM

## 2024-11-10 NOTE — H&P (Signed)
 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 Rosslyn complicated by seizures maintained on Keppra  and followed by medical oncology Dr. [name] and received CIR 08/04/2024 - 08/09/2024 discharged to home ambulating contact-guard assist with walker.  Per chart review patient lives with spouse.  1 level home one-step to entry.  Modified independent with rolling walker prior to admission.  Wife did assist with ADLs.  Presented 11/03/2024 with generalized weakness/lethargy as well as increasing left-sided weakness over several days .  Wife had reported fall day prior to admission..  It was noted patient recently taken off of steroids which were being used to treat his glioblastoma.  CT/MRI of the brain showed slightly increased size of the right parietal mass with increased involvement of the right splenium of the corpus callosum.  Increased associated vasogenic edema in the right parietal lobe.  Soft tissue swelling in the left facial soft tissues concerning for possible contusion.  Admission chemistries unremarkable except total protein 5.9, albumin 3.4, total bilirubin 1.8, urinalysis negative nitrite, troponin 60-47-66.  Hospital course follow-up medical oncology Dr. [name] suspect symptoms due to postradiation inflammation and Decadron  has been resumed indefinitely 4 mg twice daily and strength overall has clearly improved.  Plan to discuss chemotherapy as outpatient.  Chronic  Eliquis  ongoing for history of atrial fibrillation.  Keppra  as indicated 1000 mg twice daily.  Hospital course intermittent bouts of hematuria felt to be related to chronic Eliquis  latest urine study unremarkable.  Pt reports some urinary urgency, UA yesterday did not indicate infection.  Elevated troponin felt to be related to demand ischemia per curb consult with cardiology services with noted history of atypical variant hypertrophic cardiomyopathy. Reports increased occasional cough past 2 days.  Palliative care consulted to establish goals of care.  Tolerating a regular consistency diet.  Therapy evaluations completed due to patient's decreased functional mobility was admitted for a comprehensive rehab program.  Review of Systems  Constitutional:  Negative for chills and fever.  HENT:  Negative for hearing loss.   Eyes:  Negative for blurred vision and double vision.  Respiratory:  Positive for cough. Negative for shortness of breath and wheezing.   Cardiovascular:  Negative for chest pain, leg swelling and PND.  Gastrointestinal:  Negative for abdominal pain, constipation, nausea and vomiting.  Genitourinary:  Positive for dysuria, hematuria and urgency. Negative for flank pain.  Musculoskeletal:  Positive for falls, joint pain and myalgias.  Skin:  Negative for rash.  Neurological:  Positive for dizziness, speech change, seizures and weakness. Negative for sensory change.  Psychiatric/Behavioral:  Positive for depression. The patient has insomnia.        Anxiety  All other systems reviewed and are negative.  Past Medical History:  Diagnosis Date   Anxiety    Arthritis    fingers   Chronic atrial fibrillation (HCC)    Depression    Family history of breast cancer    Family history of cancer of male genital organ    Family history of gene mutation    BRIP1  Family history of malignant neoplasm of gastrointestinal tract    History of radiation therapy    08-23-24 - 09-12-24 Brain  (parietal lobe) Dr. Lauraine Golden, MD   Hyperlipidemia    Hypertension    Personal history of colonic polyps 11/12/2006   Sleep apnea    Past Surgical History:  Procedure Laterality Date   2-D echocardiogram  07/22/2010   Ejection fraction greater than 55%. Left atrium moderately dilated. Right atrium moderately dilated. 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 Mild to Moderate MR. Mild to moderate TR.   APPLICATION OF CRANIAL NAVIGATION Right 07/17/2024   Procedure: COMPUTER-ASSISTED NAVIGATION, FOR CRANIAL PROCEDURE;  Surgeon: Rosslyn Dino HERO, MD;  Location: [facility] OR;  Service: Neurosurgery;  Laterality: Right;  CRANIAL NAVIGATION   ATRIAL ABLATION SURGERY  11/09/2002   EP IMPLANTABLE DEVICE N/A 02/11/2016   Procedure: Loop Recorder Insertion;  Surgeon: Jerel Balding, MD;  Location: [facility] INVASIVE CV LAB;  Service: Cardiovascular;  Laterality: N/A;   Exercise Myoview stress test  07/08/2000   Nonischemic low-risk.   HIP FRACTURE SURGERY  1970s   IR GENERIC HISTORICAL  01/21/2017   IR RADIOLOGIST EVAL & MGMT 01/21/2017 [facility] RAD   STERIOTACTIC STIMULATOR INSERTION Right 07/17/2024   Procedure: OPEN CRANIOTOMY FOR BIOPSY;  Surgeon: Rosslyn Dino HERO, MD;  Location: [facility] OR;  Service: Neurosurgery;  Laterality: Right;  RIGHT STEREOTACTIC BRAIN BIOPSY   Family History  Problem Relation Age of Onset   Dementia Mother    Heart disease Father    Diabetes Father    Other Sister        BRIP1 gene mutation   Kidney disease Brother    Colon cancer Maternal Uncle    Lung cancer Paternal Aunt        d. >50   Cancer Paternal Aunt        unknown type, d. >50   Cancer Cousin 17       gynecologic (paternal first cousin)   Colon cancer Nephew 27       arising in colon polyp   Cancer Niece 39       gynecologic; niece in her 32's   Breast cancer Niece 48   Other Niece        BRIP1 gene mutation   Cancer Paternal Great-grandmother        abdominal cancer (PGF's mother) great grandmother   Social  History:  reports that he has quit smoking. His smoking use included cigarettes. He has never used smokeless tobacco. He reports that he does not drink alcohol and does not use drugs. Allergies: Allergies[1] Medications Prior to Admission  Medication Sig Dispense Refill   acetaminophen  (TYLENOL ) 325 MG tablet Take 1-2 tablets (325-650 mg total) by mouth every 4 (four) hours as needed for mild pain (pain score 1-3).     apixaban  (ELIQUIS ) 5 MG TABS tablet Take 1 tablet (5 mg total) by mouth 2 (two) times daily. 60 tablet 0   atorvastatin  (LIPITOR) 40 MG tablet Take 1 tablet (40 mg total) by mouth daily. 30 tablet 0   calcium  carbonate (OSCAL) 1500 (600 Ca) MG TABS tablet Take 1 tablet (1,500 mg total) by mouth 2 (two) times daily with a meal. 60 tablet 2   carvedilol  (COREG ) 12.5 MG tablet Take 1 tablet (12.5 mg total) by mouth 2 (two) times daily. 60 tablet 3   Cyanocobalamin (VITAMIN B-12 PO) Take 1 capsule by mouth daily.     gabapentin  (NEURONTIN ) 100 MG capsule Take 1 capsule (100 mg total)  by mouth 3 (three) times daily. (Patient taking differently: Take 100 mg by mouth 3 (three) times daily as needed.) 90 capsule 2   levETIRAcetam  (KEPPRA ) 1000 MG tablet Take 1 tablet (1,000 mg total) by mouth 2 (two) times daily. 60 tablet 2   Magnesium  Oxide -Mg Supplement 250 MG TABS Take 1 tablet (250 mg total) by mouth daily in the afternoon. 30 tablet 0   Magnesium  Oxide 250 MG TABS Take 1 tablet (250 mg total) by mouth daily in the afternoon. 30 tablet 0   melatonin 3 MG TABS tablet Take 1 tablet (3 mg total) by mouth at bedtime. 30 tablet 2   Multiple Vitamin (MULTIVITAMIN ADULT PO) Take 1 tablet by mouth daily.     ondansetron  (ZOFRAN ) 8 MG tablet Take 1 tablet (8 mg total) by mouth every 8 (eight) hours as needed for nausea or vomiting. May take 30-60 minutes prior to Temodar  administration if nausea/vomiting occurs as needed. 30 tablet 1   oxyCODONE  (OXY IR/ROXICODONE ) 5 MG immediate release tablet  Take 1 tablet (5 mg total) by mouth every 6 (six) hours as needed for moderate pain (pain score 4-6). 30 tablet 0   pantoprazole  (PROTONIX ) 40 MG tablet Take 1 tablet (40 mg total) by mouth daily. 90 tablet 0   polyethylene glycol (MIRALAX  / GLYCOLAX ) 17 g packet Take 17 g by mouth daily.     QUEtiapine  (SEROQUEL ) 25 MG tablet Take 1 tablet (25 mg total) by mouth daily. 90 tablet 0   tamsulosin  (FLOMAX ) 0.4 MG CAPS capsule Take 1 capsule (0.4 mg total) by mouth daily. (Patient taking differently: Take 0.4 mg by mouth daily as needed.) 90 capsule 0   temozolomide  (TEMODAR ) 140 MG capsule Take 2 capsules (280 mg total) by mouth daily. Take for 5 days on, 23 days off. Repeat every 28 days. May take on an empty stomach to decrease nausea & vomiting. 10 capsule 0   furosemide  (LASIX ) 20 MG tablet Take 1 tablet (20 mg total) by mouth daily as needed for up to 14 days. Stop taking if swelling resolves (Patient not taking: Reported on 10/17/2024) 14 tablet 0   LORazepam  (ATIVAN ) 0.5 MG tablet Take 1 tablet 30 minutes prior to radiation procedures or MRIs as needed to help with anxiety. (OK to take 2 tablets before MRIs). (Patient not taking: Reported on 11/03/2024) 18 tablet 0   Nystatin  (GERHARDT'S BUTT CREAM) CREA Apply to affected area (Patient not taking: Reported on 11/03/2024) 60 each 0      Home: Home Living Family/patient expects to be discharged to:: Private residence Living Arrangements: Spouse/significant other Available Help at Discharge: Family, Available 24 hours/day Type of Home: House Home Access: Ramped entrance Entrance Stairs-Number of Steps: 1 threshold step Entrance Stairs-Rails: None Home Layout: One level Bathroom Shower/Tub: Engineer, Manufacturing Systems: Handicapped height Bathroom Accessibility: Yes Home Equipment: Information systems manager, Agricultural Consultant (2 wheels), Wheelchair - manual, BSC/3in1  Lives With: Spouse   Functional History: Prior Function Prior Level of Function :  Independent/Modified Independent, History of Falls (last six months) Mobility Comments: ambulatory short distances to bathroom with RW ADLs Comments: assist for bathing, ind with toileting, ind with dressing and grooming tasks  Functional Status:  Mobility: Bed Mobility Overal bed mobility: Needs Assistance Bed Mobility: Rolling, Sidelying to Sit Rolling: Supervision Sidelying to sit: Contact guard assist, HOB elevated, Used rails Supine to sit: Contact guard Sit to supine: Contact guard assist General bed mobility comments: increased time and verbal cues Transfers Overall transfer  level: Needs assistance Equipment used: Rolling walker (2 wheels) Transfers: Sit to/from Stand Sit to Stand: Min assist General transfer comment: cues for hand placement and min assist to power up and occasional cues for walker management and keeping close distance with RW Ambulation/Gait Ambulation/Gait assistance: +2 safety/equipment, Min assist Gait Distance (Feet): 25 Feet (seated rest, 25) Assistive device: Rolling walker (2 wheels) Gait Pattern/deviations: Trunk flexed, Wide base of support, Shuffle, Decreased stride length, Step-through pattern General Gait Details: constant cues and assist for proximity to RW, upright posture, and incr step length. Chair follow for safety Gait velocity: decreased Pre-gait activities: see above in transfers; no +2 for chair follow    ADL: ADL Overall ADL's : Needs assistance/impaired Eating/Feeding: Set up Grooming: Wash/dry hands, Wash/dry face, Oral care, Brushing hair, Contact guard assist, Standing Upper Body Bathing: Minimal assistance, Standing Lower Body Bathing: Moderate assistance, Sit to/from stand Upper Body Dressing : Moderate assistance Lower Body Dressing: Moderate assistance Toilet Transfer: Moderate assistance, +2 for physical assistance, Ambulation, Rolling walker (2 wheels) Toileting- Clothing Manipulation and Hygiene: Moderate  assistance Functional mobility during ADLs: Moderate assistance, Rolling walker (2 wheels), +2 for physical assistance General ADL Comments: occasional cues for sequencing  Cognition: Cognition Orientation Level: Oriented X4 Cognition Arousal: Alert Behavior During Therapy: Flat affect  Physical Exam: Blood pressure (!) 146/92, pulse 71, temperature 98.1 F (36.7 C), temperature source Oral, resp. rate 18, height 5' 8 (1.727 m), weight 72.6 kg, SpO2 98%.  General: No apparent distress HEENT: Head is normocephalic, atraumatic, sclera anicteric, oral mucosa dry Heart: Reg rate and rhythm. No murmurs rubs or gallops Chest: CTA bilaterally without wheezes, rales, or rhonchi; no distress Abdomen: Soft, non-tender, non-distended, bowel sounds positive. Extremities: No clubbing, cyanosis, or edema. Psych: Pt's affect is appropriate. Pt is cooperative Skin: Clean and intact without signs of breakdown, abrasion on his left thigh-reports happened during MRI Neuro:     Mental Status: AAOx person and place, not date, Recalls 1/3 words 5 min later, slightly delayed responses  Speech/Languate: Naming and repetition intact, fluent, speech a little dysarthric,  follows simple commands     CRANIAL NERVES: II: PERRL. Visual fields full III, IV, VI: EOM intact,  R eye ptosis- chronic  V: normal sensation bilaterally VII: no asymmetry VIII: normal hearing to speech IX, X: normal palatal elevation XI: head turn intact XII: Tongue midline     MOTOR: RUE: 4+/5 Deltoid, 4/5 Biceps, 4/5 Triceps,4/5 Grip LUE: 4+/5 Deltoid, 4/5 Biceps, 4/5 Triceps, 4/5 Grip RLE: HF 4-/5, KE 4/5, ADF 4/5, APF 4/5 LLE: HF 4-/5, KE 4-/5, ADF 4-/5, APF 4-/5     REFLEXES: No hyperreflexia noted, no Hoffman's, no clonus   SENSORY: Normal to touch all 4 extremities   Coordination: Normal finger to nose  IV R arm looks ok   Results for orders placed or performed during the hospital encounter of 11/03/24 (from  the past 48 hours)  Urinalysis, Routine w reflex microscopic -Urine, Clean Catch     Status: Abnormal   Collection Time: 11/09/24  1:16 PM  Result Value Ref Range   Color, Urine YELLOW YELLOW   APPearance CLEAR CLEAR   Specific Gravity, Urine 1.021 1.005 - 1.030   pH 5.0 5.0 - 8.0   Glucose, UA >=500 (A) NEGATIVE mg/dL   Hgb urine dipstick NEGATIVE NEGATIVE   Bilirubin Urine NEGATIVE NEGATIVE   Ketones, ur NEGATIVE NEGATIVE mg/dL   Protein, ur 30 (A) NEGATIVE mg/dL   Nitrite NEGATIVE NEGATIVE   Leukocytes,Ua NEGATIVE  NEGATIVE   RBC / HPF 0-5 0 - 5 RBC/hpf   WBC, UA 0-5 0 - 5 WBC/hpf   Bacteria, UA NONE SEEN NONE SEEN   Squamous Epithelial / HPF 0-5 0 - 5 /HPF   Mucus PRESENT     Comment: Performed at [facility], [address], [place], [place] [address]  CBC     Status: Abnormal   Collection Time: 11/10/24  2:20 AM  Result Value Ref Range   WBC 11.7 (H) 4.0 - 10.5 K/uL   RBC 4.43 4.22 - 5.81 MIL/uL   Hemoglobin 14.3 13.0 - 17.0 g/dL   HCT 57.6 60.9 - 47.9 %   MCV 95.5 80.0 - 100.0 fL   MCH 32.3 26.0 - 34.0 pg   MCHC 33.8 30.0 - 36.0 g/dL   RDW 86.4 88.4 - 84.4 %   Platelets 305 150 - 400 K/uL   nRBC 0.3 (H) 0.0 - 0.2 %    Comment: Performed at [facility], [address], [place], [place] [address]   No results found.    Blood pressure (!) 146/92, pulse 71, temperature 98.1 F (36.7 C), temperature source Oral, resp. rate 18, height 5' 8 (1.727 m), weight 72.6 kg, SpO2 98%.  Medical Problem List and Plan: 1. Functional deficits secondary to glioblastoma with vasogenic edema suspect symptoms due to postradiation inflammation with history of right craniotomy 08/06/2024.  Chronic Decadron  resumed.  Plan to address chemotherapy as outpatient per medical oncology Dr. [name]  -patient may shower  -ELOS/Goals: PT/OT/SLP supervision  -Admit to CIR 2.  Antithrombotics: -DVT/anticoagulation:  Pharmaceutical: Eliquis   -antiplatelet therapy: N/A 3. Pain  Management: Oxycodone  as needed 4. Mood/Behavior/Sleep: Melatonin 3 mg nightly.  Provide emotional support  -antipsychotic agents: Seroquel  25 mg nightly 5. Neuropsych/cognition: This patient is not capable of making decisions on his own behalf. 6. Skin/Wound Care: Routine skin checks 7. Fluids/Electrolytes/Nutrition: Routine N and outs with follow-up chemistries. 8.  Seizure disorder.  Keppra  1000 mg twice daily 9.  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  Continue Eliquis .  Cardiac rate controlled.  Follow-up cardiology services. Daily weight.  10.  Hypertension.  Coreg  12.5 mg twice daily.  Monitor with increased mobility 11.  Hyperlipidemia.  Lipitor 12.  Constipation.  MiraLAX  daily. LBM 11/10/24 13.  BPH/history of hematuria.  Check PVR.  Patient on Flomax . U/A 11/09/24 did not indicate infection  prior to admission but only taking it as needed 14.  Osteoporosis.  Os-Cal 1250 mg twice daily 15.  Cough. Check CXR  Toribio JINNY Pitch, PA-C 11/10/2024  I have personally performed a face to face diagnostic evaluation of this patient and formulated the key components of the plan.  Additionally, I have personally reviewed laboratory data, imaging studies, as well as relevant notes and concur with the physician assistant's documentation above.  The patient's status has not changed from the original H&P.  Any changes in documentation from the acute care chart have been noted above.  Murray Collier, MD     [1] No Known Allergies  "

## 2024-11-10 NOTE — Plan of Care (Signed)
" °  Problem: Consults Goal: RH GENERAL PATIENT EDUCATION Description: See Patient Education module for education specifics. Outcome: Progressing Goal: Nutrition Consult-if indicated Outcome: Progressing   Problem: RH BOWEL ELIMINATION Goal: RH STG MANAGE BOWEL WITH ASSISTANCE Description: STG Manage Bowel with mod I Assistance. Outcome: Progressing Goal: RH STG MANAGE BOWEL W/MEDICATION W/ASSISTANCE Description: STG Manage Bowel with Medication with  mod I Assistance. Outcome: Progressing   Problem: RH BLADDER ELIMINATION Goal: RH STG MANAGE BLADDER WITH ASSISTANCE Description: STG Manage Bladder With mod I  Assistance Outcome: Progressing Goal: RH STG MANAGE BLADDER WITH MEDICATION WITH ASSISTANCE Description: STG Manage Bladder With Medication With Assistance. Outcome: Progressing   Problem: RH SAFETY Goal: RH STG ADHERE TO SAFETY PRECAUTIONS W/ASSISTANCE/DEVICE Description: STG Adhere to Safety Precautions With cues Assistance/Device. Outcome: Progressing   Problem: RH KNOWLEDGE DEFICIT GENERAL Goal: RH STG INCREASE KNOWLEDGE OF SELF CARE AFTER HOSPITALIZATION Description: Patient and wife will be able to manage care at discharge using educational resources for medications and care independently Outcome: Progressing   "

## 2024-11-10 NOTE — Discharge Summary (Signed)
 "  Family Medicine Teaching [facility] Discharge Summary  Patient name: Wayne Molina Medical record number: [id number] Date of birth: 06-Feb-1956 Age: 69 y.o. Gender: male Date of Admission: 11/03/2024  Date of Discharge: 11/10/2024 Admitting Physician: Laymon JINNY Legions, MD  Primary Care Provider: Janna Ferrier, DO Consultants: Heme/Onc, Palliative  Indication for Hospitalization: Generalized weakness  Discharge Diagnoses/Problem List:  Principal Problem for Admission: Intermittent Weakness Other Problems addressed during stay:  Principal Problem:   Generalized weakness Active Problems:   Glioblastoma, IDH-wildtype (HCC)   Chronic health problem   Fall   Vasogenic edema (HCC)   Frequent bowel movements   Dysuria  Brief Hospital Course:  RAHKEEM SENFT is a 69 y.o.male with a history of glioblastoma receiving chemotherapy, chronic Afib on eliquis , HTN, HLD, OSA, 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 His hospital course is detailed below:  Generalized weakness I Fall  Postradiation inflammation and treatment effect  Patient stable on admission without altered mental status. Afebrile without focal deficits or lab abnormalities. CT head with progressive worsening of previous mass and increased vasogenic edema. Patient's steroids were recently tapered off, making symptoms likely due to vasogenic edema secondary to glioblastoma. He received decadron  10 mg, hydralazine , and keppra  1000 mg in the ED. Dr. [name], patient's neuro oncologist recommended IV decadron  4 mg q 6 hours and then po 4 mg decadron  BID upon discharge. He also mentioned that CT findings may very well be consistent with typical post radiation changes. Currently no need to consult neurosurgery. Home Keppra  1000 mg BID was continued. Chemotherapy (Temozolomide  280 mg) was held during admission.   Afib with RVR  Patient  had Afib with rates up to 130. He missed one day of his home medications, likely the cause. Home eliquis  5 mg BID and carvedilol  12.5 mg BID were continued and heart rate improved.   Hyperbilirubinemia  Elevated up to 2.1. Resolved spontaneously without further intervention or work up.  Hematuria  Patient had hematuria on 12/27. Improved then had repeat instances.   Other chronic conditions were medically managed with home medications and formulary alternatives as necessary   PCP Follow-up Recommendations: Chemotherapy regimen after discharge  Consider outpatient follow up with urology for hematuria during admission      Results/Tests Pending at Time of Discharge:  Unresulted Labs (From admission, onward)     Start     Ordered   Signed and Held  Comprehensive metabolic panel  Once,   R       Question:  Specimen collection method  Answer:  Lab=Lab collect   Signed and Held   Signed and Held  CBC with Differential/Platelet  Once,   R       Question:  Specimen collection method  Answer:  Lab=Lab collect   Signed and Held             Disposition: CIR  Discharge Condition: Stable  Discharge Exam:  Vitals:   11/09/24 2200 11/10/24 0915  BP: (!) 146/92 120/83  Pulse: 71 90  Resp: 18 18  Temp: 98.1 F (36.7 C) 98.4 F (36.9 C)  SpO2: 98% 97%   General: sleeping, NAD Cardiovascular: RRR Respiratory: CTAB, normal WOB on RA Extremities: moves all equally Neuro: A&O, easily arousal, no gross focal deficits present  Significant Procedures: none  Significant Labs and Imaging:  Recent Labs  Lab 11/10/24 0220  WBC 11.7*  HGB 14.3  HCT  42.3  PLT 305   No results for input(s): NA, K, CL, CO2, GLUCOSE, BUN, CREATININE, CALCIUM , MG, PHOS, ALKPHOS, AST, ALT, ALBUMIN, PROTEIN in the last 48 hours.  Discharge Medications:  Allergies as of 11/10/2024   No Known Allergies      Medication List     STOP taking these medications     furosemide  20 MG tablet Commonly known as: LASIX    LORazepam  0.5 MG tablet Commonly known as: ATIVAN        TAKE these medications    acetaminophen  325 MG tablet Commonly known as: TYLENOL  Take 1-2 tablets (325-650 mg total) by mouth every 4 (four) hours as needed for mild pain (pain score 1-3).   atorvastatin  40 MG tablet Commonly known as: LIPITOR Take 1 tablet (40 mg total) by mouth daily.   benzonatate  100 MG capsule Commonly known as: TESSALON  Take 1 capsule (100 mg total) by mouth 3 (three) times daily as needed for cough.   calcium  carbonate 1500 (600 Ca) MG Tabs tablet Commonly known as: OSCAL Take 1 tablet (1,500 mg total) by mouth 2 (two) times daily with a meal.   carvedilol  12.5 MG tablet Commonly known as: COREG  Take 1 tablet (12.5 mg total) by mouth 2 (two) times daily.   dexamethasone  4 MG tablet Commonly known as: DECADRON  Take 1 tablet (4 mg total) by mouth 2 (two) times daily.   Eliquis  5 MG Tabs tablet Generic drug: apixaban  Take 1 tablet (5 mg total) by mouth 2 (two) times daily.   gabapentin  100 MG capsule Commonly known as: NEURONTIN  Take 1 capsule (100 mg total) by mouth 3 (three) times daily. What changed:  when to take this reasons to take this   Gerhardt's butt cream Crea Apply to affected area   levETIRAcetam  1000 MG tablet Commonly known as: KEPPRA  Take 1 tablet (1,000 mg total) by mouth 2 (two) times daily.   Magnesium  Oxide -Mg Supplement 250 MG Tabs Take 1 tablet (250 mg total) by mouth daily in the afternoon.   Magnesium  Oxide 250 MG Tabs Take 1 tablet (250 mg total) by mouth daily in the afternoon.   melatonin 3 MG Tabs tablet Take 1 tablet (3 mg total) by mouth at bedtime.   MULTIVITAMIN ADULT PO Take 1 tablet by mouth daily.   nystatin  powder Commonly known as: MYCOSTATIN /NYSTOP  Apply topically 2 (two) times daily.   ondansetron  8 MG tablet Commonly known as: ZOFRAN  Take 1 tablet (8 mg total) by mouth every 8  (eight) hours as needed for nausea or vomiting. May take 30-60 minutes prior to Temodar  administration if nausea/vomiting occurs as needed.   oxyCODONE  5 MG immediate release tablet Commonly known as: Oxy IR/ROXICODONE  Take 1 tablet (5 mg total) by mouth every 6 (six) hours as needed for moderate pain (pain score 4-6).   pantoprazole  40 MG tablet Commonly known as: PROTONIX  Take 1 tablet (40 mg total) by mouth daily.   polyethylene glycol 17 g packet Commonly known as: MIRALAX  / GLYCOLAX  Take 17 g by mouth daily.   QUEtiapine  25 MG tablet Commonly known as: SEROQUEL  Take 1 tablet (25 mg total) by mouth daily.   tamsulosin  0.4 MG Caps capsule Commonly known as: FLOMAX  Take 1 capsule (0.4 mg total) by mouth daily. What changed:  when to take this reasons to take this   temozolomide  140 MG capsule Commonly known as: TEMODAR  Take 2 capsules (280 mg total) by mouth daily. Take for 5 days on, 23 days off. Repeat every 28  days. May take on an empty stomach to decrease nausea & vomiting.   VITAMIN B-12 PO Take 1 capsule by mouth daily.        Discharge Instructions: Please refer to Patient Instructions section of EMR for full details.  Patient was counseled important signs and symptoms that should prompt return to medical care, changes in medications, dietary instructions, activity restrictions, and follow up appointments.   Follow-Up Appointments:   Cleotilde Lukes, DO 11/10/2024, 10:47 AM PGY-2, [facility]  "

## 2024-11-10 NOTE — Plan of Care (Signed)

## 2024-11-10 NOTE — Progress Notes (Signed)
 Pt is a new admit. Alert and oriented x4. He is calm, cooperative and pleasant. Family at bedside. Vitals WNL. Pt has multiple areas of erythema (blanchable): left heel and buttocks. Mepilex foam applied for protection. Abrasion to left hip.( See Media). Skin otherwise intact. Pt denies any pain or discomfort. Able to verbalize needs. He has no concerns at this time.

## 2024-11-10 NOTE — Progress Notes (Signed)
 Occupational Therapy Treatment Patient Details Name: Wayne Molina MRN: [id number] DOB: 17-Feb-1956 Today's Date: 11/10/2024   History of present illness 69 y/o M presenting to ED on 12/26 with fall, generalized weakness, CT head with progressive worsening of previous mass and incr vasogenic edema.    PMH includes glioblastoma, A fib on Eliquis , HTN, HLD, OSA, anxiety/depression, seizures 2/2 brain mass   OT comments  Patient demonstrating good gains with OT treatment. Patient performed shower today with cues for stepping into shower and min assist and patient able to stand for peri area bathing while standing with support from grab bars. Patient making gains with LB dressing but required mod assists to complete. Patient will benefit from intensive inpatient follow-up therapy, >3 hours/day.  Acute OT to continue to follow to address established goals to facilitate DC to next venue of care.        If plan is discharge home, recommend the following:  A lot of help with bathing/dressing/bathroom;Assistance with cooking/housework;Direct supervision/assist for medications management;Direct supervision/assist for financial management;Assist for transportation;Help with stairs or ramp for entrance;A little help with walking and/or transfers   Equipment Recommendations  None recommended by OT    Recommendations for Other Services      Precautions / Restrictions Precautions Precautions: Fall Restrictions Weight Bearing Restrictions Per Provider Order: No       Mobility Bed Mobility Overal bed mobility: Needs Assistance Bed Mobility: Rolling, Sidelying to Sit Rolling: Supervision Sidelying to sit: Contact guard assist, HOB elevated, Used rails       General bed mobility comments: increased time and verbal cues    Transfers Overall transfer level: Needs assistance Equipment used: Rolling walker (2 wheels) Transfers: Sit to/from Stand Sit to Stand: Min assist           General  transfer comment: cues for hand placement and min assist to power up     Balance Overall balance assessment: Needs assistance Sitting-balance support: No upper extremity supported, Feet supported Sitting balance-Leahy Scale: Fair Sitting balance - Comments: EOB   Standing balance support: Single extremity supported, Bilateral upper extremity supported, During functional activity Standing balance-Leahy Scale: Poor Standing balance comment: reliant on at least one extremity support when standing                           ADL either performed or assessed with clinical judgement   ADL Overall ADL's : Needs assistance/impaired     Grooming: Set up;Sitting   Upper Body Bathing: Supervision/ safety;Sitting Upper Body Bathing Details (indicate cue type and reason): in shower Lower Body Bathing: Moderate assistance;Sit to/from stand Lower Body Bathing Details (indicate cue type and reason): in shower Upper Body Dressing : Minimal assistance;Sitting Upper Body Dressing Details (indicate cue type and reason): gown change Lower Body Dressing: Moderate assistance Lower Body Dressing Details (indicate cue type and reason): to donn socks         Tub/ Shower Transfer: Walk-in shower;Minimal assistance;Grab bars Web Designer Details (indicate cue type and reason): min assist to steady with cues for technique Functional mobility during ADLs: Minimal assistance;Rolling walker (2 wheels) General ADL Comments: occasional cues for sequencing    Extremity/Trunk Assessment              Vision       Perception     Praxis     Communication Communication Communication: No apparent difficulties   Cognition Arousal: Alert Behavior During Therapy: Flat affect Cognition: Cognition impaired  Memory impairment (select all impairments): Short-term memory Attention impairment (select first level of impairment): Sustained attention Executive functioning impairment  (select all impairments): Problem solving, Sequencing                   Following commands: Impaired Following commands impaired: Follows one step commands with increased time      Cueing   Cueing Techniques: Verbal cues, Gestural cues  Exercises      Shoulder Instructions       General Comments wife present and supportive    Pertinent Vitals/ Pain       Pain Assessment Pain Assessment: No/denies pain  Home Living                                          Prior Functioning/Environment              Frequency  Min 2X/week        Progress Toward Goals  OT Goals(current goals can now be found in the care plan section)  Progress towards OT goals: Progressing toward goals     Plan      Co-evaluation                 AM-PAC OT 6 Clicks Daily Activity     Outcome Measure   Help from another person eating meals?: A Little Help from another person taking care of personal grooming?: A Little Help from another person toileting, which includes using toliet, bedpan, or urinal?: A Lot Help from another person bathing (including washing, rinsing, drying)?: A Lot Help from another person to put on and taking off regular upper body clothing?: A Lot Help from another person to put on and taking off regular lower body clothing?: A Lot 6 Click Score: 14    End of Session Equipment Utilized During Treatment: Gait belt;Rolling walker (2 wheels)  OT Visit Diagnosis: Unsteadiness on feet (R26.81);Other abnormalities of gait and mobility (R26.89);Muscle weakness (generalized) (M62.81)   Activity Tolerance Patient tolerated treatment well   Patient Left in chair;with call bell/phone within reach;with chair alarm set;with family/visitor present   Nurse Communication Mobility status        Time: 9189-9147 OT Time Calculation (min): 42 min  Charges: OT General Charges $OT Visit: 1 Visit OT Treatments $Self Care/Home Management : 38-52  mins  Wayne Molina, OTA Acute Rehabilitation Services  Office [phone]   Wayne Molina 11/10/2024, 1:14 PM

## 2024-11-10 NOTE — Progress Notes (Signed)
 Signed      Expand All Collapse All          Physical Medicine and Rehabilitation Consult Reason for Consult:Rehab Referring Physician: Alena     HPI: Wayne Molina is a 69 y.o. male with past medical history of A-fib on Eliquis , hyperlipidemia, OSA, hypertension, depression, glioblastoma status post craniotomy, seizures, arthritis who presented to the hospital with generalized weakness.  Was recently taken off steroids with the use to treat his glioblastoma.  Patient had a prior admission at [facility] and was discharged on 08/09/2024.  CT of his head with slightly increased size of right parietal mass and increased vasogenic edema in the right parietal lobe, soft tissue swelling of left facial soft tissue concerning for contusion.  MRI brain with continued increase in peripheral enhancement of known glioblastoma with increased surrounding FLAIR hyperintense signal that could represent tumor progression and/or post treatment changes.  Patient was seen by medical oncology and continued on steroids.  Patient seen by PT and OT and felt to be a candidate for CIR.   Patient lives in a [place] home, family can assist 24 hours a day.   Home: Home Living Family/patient expects to be discharged to:: Private residence Living Arrangements: Spouse/significant other Available Help at Discharge: Family, Available 24 hours/day Type of Home: House Home Access: Ramped entrance Entrance Stairs-Number of Steps: 1 threshold step Entrance Stairs-Rails: None Home Layout: One level Bathroom Shower/Tub: Engineer, Manufacturing Systems: Handicapped height Bathroom Accessibility: Yes Home Equipment: Information systems manager, Agricultural Consultant (2 wheels), Wheelchair - manual, BSC/3in1  Lives With: Spouse  Functional History: Prior Function Prior Level of Function : Independent/Modified Independent, History of Falls (last six months) Mobility Comments: ambulatory short distances to bathroom with RW ADLs Comments: assist for  bathing, ind with toileting, ind with dressing and grooming tasks Functional Status:  Mobility: Bed Mobility Overal bed mobility: Needs Assistance Bed Mobility: Rolling, Sidelying to Sit, Sit to Supine Rolling: Supervision Sidelying to sit: Contact guard assist, HOB elevated, Used rails Supine to sit: Contact guard Sit to supine: Contact guard assist General bed mobility comments: Increased time but no physical assistance. Transfers Overall transfer level: Needs assistance Equipment used: Rolling walker (2 wheels), 1 person hand held assist Transfers: Sit to/from Stand Sit to Stand: Min assist, Mod assist General transfer comment: initial 2 transfers face to face with pt pushing off bed and then coming up to hold PTs UEs; initial attempt to achieve full upright with Rt knee buckling and pt returned to sitting EOB; ultimately progress to side stepping along EOB and then marching with RW Ambulation/Gait Ambulation/Gait assistance: +2 physical assistance, +2 safety/equipment, Mod assist Gait Distance (Feet): 20 Feet Assistive device: Rolling walker (2 wheels) Gait Pattern/deviations: Trunk flexed, Wide base of support, Shuffle, Decreased stride length, Step-through pattern General Gait Details: Pt able to ambulate to door and back with RW +2 Mod A. Constant cues for proximity to RW, safety, and sequencing. Gait velocity: decreased Pre-gait activities: see above in transfers; no +2 for chair follow   ADL: ADL Overall ADL's : Needs assistance/impaired Eating/Feeding: Set up Grooming: Set up Upper Body Bathing: Moderate assistance Lower Body Bathing: Moderate assistance Upper Body Dressing : Moderate assistance Lower Body Dressing: Moderate assistance Toilet Transfer: Moderate assistance, +2 for physical assistance, Ambulation, Rolling walker (2 wheels) Toileting- Clothing Manipulation and Hygiene: Moderate assistance Functional mobility during ADLs: Moderate assistance, Rolling walker  (2 wheels), +2 for physical assistance   Cognition: Cognition Orientation Level: Oriented X4 Cognition Arousal: Alert Behavior  During Therapy: Flat affect     Review of Systems  Constitutional:  Negative for chills and fever.  HENT:  Negative for hearing loss.   Eyes:  Negative for blurred vision and double vision.  Respiratory:  Negative for cough and shortness of breath.   Cardiovascular:  Negative for chest pain.  Gastrointestinal:  Negative for abdominal pain, constipation, diarrhea, nausea and vomiting.  Genitourinary: Negative.   Musculoskeletal:  Positive for joint pain. Negative for back pain and neck pain.  Skin:  Negative for rash.  Neurological:  Positive for tingling and weakness. Negative for speech change.  Psychiatric/Behavioral:  Positive for memory loss. Negative for depression.        Past Medical History:  Diagnosis Date   Anxiety     Arthritis      fingers   Chronic atrial fibrillation (HCC)     Depression     Family history of breast cancer     Family history of cancer of male genital organ     Family history of gene mutation      BRIP1   Family history of malignant neoplasm of gastrointestinal tract     History of radiation therapy      08-23-24 - 09-12-24 Brain (parietal lobe) Dr. Lauraine Golden, MD   Hyperlipidemia     Hypertension     Personal history of colonic polyps 11/12/2006   Sleep apnea               Past Surgical History:  Procedure Laterality Date   2-D echocardiogram   07/22/2010    Ejection fraction greater than 55%. Left atrium moderately dilated. Right atrium moderately dilated. 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 Mild to Moderate MR. Mild to moderate TR.   APPLICATION OF CRANIAL NAVIGATION Right 07/17/2024    Procedure: COMPUTER-ASSISTED NAVIGATION, FOR CRANIAL PROCEDURE;  Surgeon: Rosslyn Dino HERO, MD;  Location: [facility] OR;  Service: Neurosurgery;  Laterality: Right;  CRANIAL NAVIGATION   ATRIAL ABLATION SURGERY   11/09/2002   EP  IMPLANTABLE DEVICE N/A 02/11/2016    Procedure: Loop Recorder Insertion;  Surgeon: Jerel Balding, MD;  Location: [facility] INVASIVE CV LAB;  Service: Cardiovascular;  Laterality: N/A;   Exercise Myoview stress test   07/08/2000    Nonischemic low-risk.   HIP FRACTURE SURGERY   1970s   IR GENERIC HISTORICAL   01/21/2017    IR RADIOLOGIST EVAL & MGMT 01/21/2017 [facility]-INTERV RAD   STERIOTACTIC STIMULATOR INSERTION Right 07/17/2024    Procedure: OPEN CRANIOTOMY FOR BIOPSY;  Surgeon: Rosslyn Dino HERO, MD;  Location: [facility] OR;  Service: Neurosurgery;  Laterality: Right;  RIGHT STEREOTACTIC BRAIN BIOPSY             Family History  Problem Relation Age of Onset   Dementia Mother     Heart disease Father     Diabetes Father     Other Sister          BRIP1 gene mutation   Kidney disease Brother     Colon cancer Maternal Uncle     Lung cancer Paternal Aunt          d. >50   Cancer Paternal Aunt          unknown type, d. >50   Cancer Cousin 17        gynecologic (paternal first cousin)   Colon cancer Nephew 27        arising in colon polyp   Cancer Niece 74  gynecologic; niece in her 60's   Breast cancer Niece 54   Other Niece          BRIP1 gene mutation   Cancer Paternal Great-grandmother          abdominal cancer (PGF's mother) great grandmother        Social History:  reports that he has quit smoking. His smoking use included cigarettes. He has never used smokeless tobacco. He reports that he does not drink alcohol and does not use drugs. Allergies: [Allergies]  [Allergies] No Known Allergies       Medications Prior to Admission  Medication Sig Dispense Refill   acetaminophen  (TYLENOL ) 325 MG tablet Take 1-2 tablets (325-650 mg total) by mouth every 4 (four) hours as needed for mild pain (pain score 1-3).       apixaban  (ELIQUIS ) 5 MG TABS tablet Take 1 tablet (5 mg total) by mouth 2 (two) times daily. 60 tablet 0   atorvastatin  (LIPITOR) 40 MG tablet Take 1 tablet (40 mg total) by  mouth daily. 30 tablet 0   calcium  carbonate (OSCAL) 1500 (600 Ca) MG TABS tablet Take 1 tablet (1,500 mg total) by mouth 2 (two) times daily with a meal. 60 tablet 2   carvedilol  (COREG ) 12.5 MG tablet Take 1 tablet (12.5 mg total) by mouth 2 (two) times daily. 60 tablet 3   Cyanocobalamin (VITAMIN B-12 PO) Take 1 capsule by mouth daily.       gabapentin  (NEURONTIN ) 100 MG capsule Take 1 capsule (100 mg total) by mouth 3 (three) times daily. (Patient taking differently: Take 100 mg by mouth 3 (three) times daily as needed.) 90 capsule 2   levETIRAcetam  (KEPPRA ) 1000 MG tablet Take 1 tablet (1,000 mg total) by mouth 2 (two) times daily. 60 tablet 2   Magnesium  Oxide -Mg Supplement 250 MG TABS Take 1 tablet (250 mg total) by mouth daily in the afternoon. 30 tablet 0   Magnesium  Oxide 250 MG TABS Take 1 tablet (250 mg total) by mouth daily in the afternoon. 30 tablet 0   melatonin 3 MG TABS tablet Take 1 tablet (3 mg total) by mouth at bedtime. 30 tablet 2   Multiple Vitamin (MULTIVITAMIN ADULT PO) Take 1 tablet by mouth daily.       ondansetron  (ZOFRAN ) 8 MG tablet Take 1 tablet (8 mg total) by mouth every 8 (eight) hours as needed for nausea or vomiting. May take 30-60 minutes prior to Temodar  administration if nausea/vomiting occurs as needed. 30 tablet 1   oxyCODONE  (OXY IR/ROXICODONE ) 5 MG immediate release tablet Take 1 tablet (5 mg total) by mouth every 6 (six) hours as needed for moderate pain (pain score 4-6). 30 tablet 0   pantoprazole  (PROTONIX ) 40 MG tablet Take 1 tablet (40 mg total) by mouth daily. 90 tablet 0   polyethylene glycol (MIRALAX  / GLYCOLAX ) 17 g packet Take 17 g by mouth daily.       QUEtiapine  (SEROQUEL ) 25 MG tablet Take 1 tablet (25 mg total) by mouth daily. 90 tablet 0   tamsulosin  (FLOMAX ) 0.4 MG CAPS capsule Take 1 capsule (0.4 mg total) by mouth daily. (Patient taking differently: Take 0.4 mg by mouth daily as needed.) 90 capsule 0   temozolomide  (TEMODAR ) 140 MG  capsule Take 2 capsules (280 mg total) by mouth daily. Take for 5 days on, 23 days off. Repeat every 28 days. May take on an empty stomach to decrease nausea & vomiting. 10 capsule 0   furosemide  (  LASIX ) 20 MG tablet Take 1 tablet (20 mg total) by mouth daily as needed for up to 14 days. Stop taking if swelling resolves (Patient not taking: Reported on 10/17/2024) 14 tablet 0   LORazepam  (ATIVAN ) 0.5 MG tablet Take 1 tablet 30 minutes prior to radiation procedures or MRIs as needed to help with anxiety. (OK to take 2 tablets before MRIs). (Patient not taking: Reported on 11/03/2024) 18 tablet 0   Nystatin  (GERHARDT'S BUTT CREAM) CREA Apply to affected area (Patient not taking: Reported on 11/03/2024) 60 each 0            Blood pressure 130/87, pulse 94, temperature (!) 97.3 F (36.3 C), temperature source Oral, resp. rate 16, height 5' 8 (1.727 m), weight 72.6 kg, SpO2 99%. Physical Exam   General: No apparent distress HEENT: Head is normocephalic, atraumatic, sclera anicteric, oral mucosa a little dry Heart: Reg rate and rhythm. No murmurs rubs or gallops Chest: CTA bilaterally without wheezes, rales, or rhonchi; no distress Abdomen: Soft, non-tender, non-distended, bowel sounds positive. Extremities: No clubbing, cyanosis, or edema. Psych: Pt's affect is appropriate. Pt is cooperative Skin: Clean and intact without signs of breakdown, abrasion on his left thigh-reports happened during MRI Neuro:     Mental Status: AAOx person and place, not date (2026), Recalls 2/3 words 5 min later, slightly delayed responses Speech/Languate: Naming and repetition intact, fluent, speech a little slurred,  follows simple commands     CRANIAL NERVES: II: PERRL. Visual fields full III, IV, VI: EOM intact,  R eye ptosis- chronic  V: normal sensation bilaterally VII: no asymmetry VIII: normal hearing to speech IX, X: normal palatal elevation XI: 5/5 head turn and 5/5 shoulder shrug bilaterally XII:  Tongue midline     MOTOR: RUE: 4+/5 Deltoid, 4/5 Biceps, 4/5 Triceps,4/5 Grip LUE: 4+/5 Deltoid, 4/5 Biceps, 4/5 Triceps, 4/5 Grip RLE: HF 4-/5, KE 4/5, ADF 4/5, APF 4/5 LLE: HF 4-/5, KE 4/5, ADF 4/5, APF 4/5     REFLEXES: No hyperreflexia noted, no Hoffman's, no clonus   SENSORY: Normal to touch all 4 extremities   Coordination: Normal finger to nose       Lab Results Last 24 Hours  No results found for this or any previous visit (from the past 24 hours).   Imaging Results (Last 48 hours)  No results found.     Assessment/Plan: Diagnosis: Glioblastoma with vasogenic edema Does the need for close, 24 hr/day medical supervision in concert with the patient's rehab needs make it unreasonable for this patient to be served in a less intensive setting? Yes Co-Morbidities requiring supervision/potential complications:  - Intertrigo, hypertension, hyperlipidemia, anxiety, depression, GERD, A-fib, seizures Due to bladder management, bowel management, safety, skin/wound care, disease management, medication administration, pain management, and patient education, does the patient require 24 hr/day rehab nursing? Yes Does the patient require coordinated care of a physician, rehab nurse, therapy disciplines of PT/OT/SLP to address physical and functional deficits in the context of the above medical diagnosis(es)? Yes Addressing deficits in the following areas: balance, endurance, locomotion, strength, transferring, bowel/bladder control, bathing, dressing, feeding, grooming, toileting, cognition, speech, language, and psychosocial support Can the patient actively participate in an intensive therapy program of at least 3 hrs of therapy per day at least 5 days per week? Yes The potential for patient to make measurable gains while on inpatient rehab is excellent Anticipated functional outcomes upon discharge from inpatient rehab are supervision  with PT, supervision with OT, supervision with  SLP. Estimated  rehab length of stay to reach the above functional goals is: 7-10 Anticipated discharge destination: Home Overall Rehab/Functional Prognosis: excellent   POST ACUTE RECOMMENDATIONS: This patient's condition is appropriate for continued rehabilitative care in the following setting: CIR Patient has agreed to participate in recommended program. Yes Note that insurance prior authorization may be required for reimbursement for recommended care.   Comment: I think patient be a good candidate for CIR, rehab coordinator to follow-up.       I have personally performed a face to face diagnostic evaluation of this patient. Additionally, I have examined the patient's medical record including any pertinent labs and radiographic images.     Thanks,   Murray Collier, MD 11/07/2024

## 2024-11-11 DIAGNOSIS — C719 Malignant neoplasm of brain, unspecified: Secondary | ICD-10-CM | POA: Diagnosis not present

## 2024-11-11 DIAGNOSIS — D72829 Elevated white blood cell count, unspecified: Secondary | ICD-10-CM | POA: Diagnosis not present

## 2024-11-11 NOTE — Progress Notes (Signed)
 "                                                        PROGRESS NOTE   Subjective/Complaints:  No SOB this am no fever overnite, elevated WBCs but on decadron   Objective:   DG Chest 2 View Result Date: 11/11/2024 EXAM: 2 VIEW(S) XRAY OF THE CHEST 11/10/2024 06:20:00 PM COMPARISON: 11/03/2024 CLINICAL HISTORY: Cough FINDINGS: LINES, TUBES AND DEVICES: Left chest loop recorder in place. LUNGS AND PLEURA: No focal pulmonary opacity. No pleural effusion. No pneumothorax. HEART AND MEDIASTINUM: Stable mild cardiomegaly. Calcified aortic atherosclerosis. BONES AND SOFT TISSUES: No acute osseous abnormality. UPPER ABDOMEN: Paucity of bowel gas. IMPRESSION: 1. No acute findings. Electronically signed by: Franky Stanford MD 11/11/2024 02:06 AM EST RP Workstation: [id number]   Recent Labs    11/10/24 0220  WBC 11.7*  HGB 14.3  HCT 42.3  PLT 305   No results for input(s): NA, K, CL, CO2, GLUCOSE, BUN, CREATININE, CALCIUM  in the last 72 hours.  Intake/Output Summary (Last 24 hours) at 11/11/2024 0727 Last data filed at 11/11/2024 0607 Gross per 24 hour  Intake 360 ml  Output 850 ml  Net -490 ml        Physical Exam: Vital Signs Blood pressure (!) 153/91, pulse 85, temperature 97.7 F (36.5 C), temperature source Oral, resp. rate 18, height 5' 8 (1.727 m), weight 73.5 kg, SpO2 97%.  EENT- strabismus Left eye General: No acute distress Mood and affect are appropriate Heart: Regular rate and rhythm no rubs murmurs or extra sounds Lungs: Clear to auscultation, breathing unlabored, no rales or wheezes Abdomen: Positive bowel sounds, soft nontender to palpation, nondistended Extremities: No clubbing, cyanosis, or edema Skin: No evidence of breakdown, no evidence of rash Neurologic: Cranial nerves II through XII intact, motor strength is 5/5 in bilateral deltoid, bicep, tricep, grip, hip flexor, knee extensors, ankle dorsiflexor and plantar flexor Sensory exam normal sensation  to light touch  in bilateral upper and lower extremities Cerebellar exam normal finger to nose to finger as well as heel to shin in bilateral upper and lower extremities Musculoskeletal: Full range of motion in all 4 extremities. No joint swelling   Assessment/Plan: 1. Functional deficits which require 3+ hours per day of interdisciplinary therapy in a comprehensive inpatient rehab setting. Physiatrist is providing close team supervision and 24 hour management of active medical problems listed below. Physiatrist and rehab team continue to assess barriers to discharge/monitor patient progress toward functional and medical goals  Care Tool:  Bathing              Bathing assist       Upper Body Dressing/Undressing Upper body dressing        Upper body assist      Lower Body Dressing/Undressing Lower body dressing            Lower body assist       Toileting Toileting    Toileting assist       Transfers Chair/bed transfer  Transfers assist           Locomotion Ambulation   Ambulation assist              Walk 10 feet activity   Assist           Walk 50 feet  activity   Assist           Walk 150 feet activity   Assist           Walk 10 feet on uneven surface  activity   Assist           Wheelchair     Assist               Wheelchair 50 feet with 2 turns activity    Assist            Wheelchair 150 feet activity     Assist          Blood pressure (!) 153/91, pulse 85, temperature 97.7 F (36.5 C), temperature source Oral, resp. rate 18, height 5' 8 (1.727 m), weight 73.5 kg, SpO2 97%.  Medical Problem List and Plan: 1. Functional deficits secondary to glioblastoma with vasogenic edema suspect symptoms due to postradiation inflammation with history of right craniotomy 08/06/2024.  Chronic Decadron  resumed.  Plan to address chemotherapy as outpatient per medical oncology Dr. [name]              -patient may shower             -ELOS/Goals: PT/OT/SLP supervision, ELOS 7d             -Admit to CIR 2.  Antithrombotics: -DVT/anticoagulation:  Pharmaceutical: Eliquis              -antiplatelet therapy: N/A 3. Pain Management: Oxycodone  as needed 4. Mood/Behavior/Sleep: Melatonin 3 mg nightly.  Provide emotional support             -antipsychotic agents: Seroquel  25 mg nightly 5. Neuropsych/cognition: This patient is not capable of making decisions on his own behalf. 6. Skin/Wound Care: Routine skin checks 7. Fluids/Electrolytes/Nutrition: Routine N and outs with follow-up chemistries. 8.  Seizure disorder.  Keppra  1000 mg twice daily 9.  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  Continue Eliquis .  Cardiac rate controlled.  Follow-up cardiology services. Daily weight.  10.  Hypertension.  Coreg  12.5 mg twice daily.  Monitor with increased mobility 11.  Hyperlipidemia.  Lipitor 12.  Constipation.  MiraLAX  daily. LBM 11/10/24 13.  BPH/history of hematuria.  Check PVR.  Patient on Flomax . U/A 11/09/24 did not indicate infection  prior to admission but only taking it as needed 14.  Osteoporosis.  Os-Cal 1250 mg twice daily 15.  Cough. Check CXR      LOS: 1 days A FACE TO FACE EVALUATION WAS PERFORMED  Prentice FORBES Compton 11/11/2024, 7:27 AM     "

## 2024-11-11 NOTE — Evaluation (Signed)
 Physical Therapy Assessment and Plan  Patient Details  Name: Wayne Molina MRN: [id number] Date of Birth: 10/15/1956  PT Diagnosis: Difficulty walking, Hemiparesis non-dominant, Impaired cognition, and Muscle weakness Rehab Potential: Good ELOS: 7- 10 days   Today's Date: 11/11/2024 PT Individual Time: 0808-0920 PT Individual Time Calculation (min): 72 min    Hospital Problem: Principal Problem:   GBM (glioblastoma multiforme) (HCC)   Past Medical History:  Past Medical History:  Diagnosis Date   Anxiety    Arthritis    fingers   Chronic atrial fibrillation (HCC)    Depression    Family history of breast cancer    Family history of cancer of male genital organ    Family history of gene mutation    BRIP1   Family history of malignant neoplasm of gastrointestinal tract    History of radiation therapy    08-23-24 - 09-12-24 Brain (parietal lobe) Dr. Lauraine Golden, MD   Hyperlipidemia    Hypertension    Personal history of colonic polyps 11/12/2006   Sleep apnea    Past Surgical History:  Past Surgical History:  Procedure Laterality Date   2-D echocardiogram  07/22/2010   Ejection fraction greater than 55%. Left atrium moderately dilated. Right atrium moderately dilated. 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 Mild to Moderate MR. Mild to moderate TR.   APPLICATION OF CRANIAL NAVIGATION Right 07/17/2024   Procedure: COMPUTER-ASSISTED NAVIGATION, FOR CRANIAL PROCEDURE;  Surgeon: Rosslyn Dino HERO, MD;  Location: [facility] OR;  Service: Neurosurgery;  Laterality: Right;  CRANIAL NAVIGATION   ATRIAL ABLATION SURGERY  11/09/2002   EP IMPLANTABLE DEVICE N/A 02/11/2016   Procedure: Loop Recorder Insertion;  Surgeon: Jerel Balding, MD;  Location: [facility] INVASIVE CV LAB;  Service: Cardiovascular;  Laterality: N/A;   Exercise Myoview stress test  07/08/2000   Nonischemic low-risk.   HIP FRACTURE SURGERY  1970s   IR GENERIC HISTORICAL  01/21/2017   IR RADIOLOGIST EVAL & MGMT 01/21/2017 [facility]INTER[facility] RAD    STERIOTACTIC STIMULATOR INSERTION Right 07/17/2024   Procedure: OPEN CRANIOTOMY FOR BIOPSY;  Surgeon: Rosslyn Dino HERO, MD;  Location: [facility] OR;  Service: Neurosurgery;  Laterality: Right;  RIGHT STEREOTACTIC BRAIN BIOPSY    Assessment & Plan Clinical Impression: Patient is a 69 y.o. 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 Rosslyn complicated by seizures maintained on Keppra  and followed by medical oncology Dr. [name] and received CIR 08/04/2024 - 08/09/2024 discharged to home ambulating contact-guard assist with walker. Per chart review patient lives with spouse. 1 level home one-step to entry. Modified independent with rolling walker prior to admission. Wife did assist with ADLs. Presented 11/03/2024 with generalized weakness/lethargy as well as increasing left-sided weakness over several days . Wife had reported fall day prior to admission.. It was noted patient recently taken off of steroids which were being used to treat his glioblastoma. CT/MRI of the brain showed slightly increased size of the right parietal mass with increased involvement of the right splenium of the corpus callosum. Increased associated vasogenic edema in the right parietal lobe. Soft tissue swelling in the left facial soft tissues concerning for possible contusion. Admission chemistries unremarkable except total protein 5.9, albumin 3.4, total bilirubin 1.8, urinalysis negative nitrite, troponin 60-47-66. Hospital  course follow-up medical oncology Dr. [name] suspect symptoms due to postradiation inflammation and Decadron  has been resumed  indefinitely 4 mg twice daily and strength overall has clearly improved. Plan to discuss chemotherapy as outpatient. Chronic Eliquis  ongoing for history of atrial fibrillation. Keppra  as indicated 1000 mg twice daily. Hospital course intermittent bouts of hematuria felt to be related to chronic Eliquis  latest urine study unremarkable. Pt reports some urinary urgency, UA yesterday did not indicate infection. Elevated troponin felt to be related to demand ischemia per curb consult with cardiology services with noted history of atypical variant hypertrophic cardiomyopathy. Reports increased occasional cough past 2 days. Palliative care consulted to establish goals of care. Tolerating a regular consistency diet. Therapy evaluations completed due to patient's decreased functional mobility was admitted for a comprehensive rehab program. Patient transferred to CIR on 11/10/2024 .   Patient currently requires min assist with mobility secondary to muscle weakness, decreased cardiorespiratoy endurance, impaired timing and sequencing and unbalanced muscle activation, hemianopsia, decreased midline orientation, decreased attention to left, and decreased motor planning, decreased awareness, decreased problem solving, decreased safety awareness, decreased memory, and delayed processing, and decreased standing balance, hemiplegia, and decreased balance strategies.  Prior to hospitalization, patient was modified independent  with mobility and lived with Spouse in a House.  Home access is  Ramped entrance.  Patient will benefit from skilled PT intervention to maximize safe functional mobility, minimize fall risk, and decrease caregiver burden for planned discharge home with 24 hour assist.  Anticipate patient will benefit from follow up [facility] at discharge.  PT - End of Session Activity Tolerance: Tolerates 30+ min activity with multiple rests Endurance Deficit: Yes PT Assessment Rehab Potential (ACUTE/IP ONLY): Good PT  Barriers to Discharge: Decreased caregiver support;Incontinence;Wound Care;Insurance for SNF coverage PT Patient demonstrates impairments in the following area(s): Balance;Endurance;Motor;Perception;Safety;Skin Integrity PT Transfers Functional Problem(s): Bed Mobility;Car;Furniture PT Locomotion Functional Problem(s): Ambulation;Stairs PT Plan PT Intensity: Minimum of 1-2 x/day ,45 to 90 minutes PT Frequency: 5 out of 7 days PT Duration Estimated Length of Stay: 7- 10 days PT Treatment/Interventions: Ambulation/gait training;Community reintegration;DME/adaptive equipment instruction;Neuromuscular re-education;Psychosocial support;Stair training;UE/LE Strength taining/ROM;Wheelchair propulsion/positioning;Balance/vestibular training;Discharge planning;Pain management;Skin care/wound management;Therapeutic Activities;UE/LE Coordination activities;Cognitive remediation/compensation;Disease management/prevention;Functional mobility training;Patient/family education;Splinting/orthotics;Therapeutic Exercise;Visual/perceptual remediation/compensation PT Transfers Anticipated Outcome(s): Mod I PT Locomotion Anticipated Outcome(s): supervision PT Recommendation Recommendations for Other Services: Therapeutic Recreation consult Therapeutic Recreation Interventions: Kitchen group;Stress management Follow Up Recommendations: Home health PT;Outpatient PT;24 hour supervision/assistance Patient destination: Home Equipment Recommended: To be determined   PT Evaluation Precautions/Restrictions   General   Vital Signs  Pain Pain Assessment Pain Scale: 0-10 Pain Score: 0-No pain Pain Interference Pain Interference Pain Effect on Sleep: 0. Does not apply - I have not had any pain or hurting in the past 5 days Pain Interference with Therapy Activities: 0. Does not apply - I have not received rehabilitationtherapy in the past 5 days Pain Interference with Day-to-Day Activities: 1. Rarely or not at  all Home Living/Prior Functioning Home Living Available Help at Discharge: Family;Available 24 hours/day Type of Home: House Home Access: Ramped entrance Home Layout: One level Bathroom Shower/Tub: [employer]: Handicapped height Bathroom Accessibility: Yes  Lives With: Spouse Prior Function Level of Independence: Requires assistive device for independence;Independent with transfers;Independent with gait;Independent with basic ADLs  Able to Take Stairs?: No Driving: No Vision/Perception  Vision - History Ability to See in Adequate Light: 0 Adequate Vision - Assessment Eye Alignment: Within Functional Limits Ocular Range of Motion: Restricted on the right;Restricted on the left;Restricted  looking down;Impaired-to be further tested in functional context Alignment/Gaze Preference: Other (comment) (R eye ptosis) Tracking/Visual Pursuits: Decreased smoothness of horizontal tracking;Decreased smoothness of vertical tracking;Decreased smoothness of eye movement to LEFT inferior field;Decreased smoothness of eye movement to RIGHT inferior field;Other (comment) (slow to track) Saccades: Undershoots;Other (comment) (undershoots to L side and slow to L side) Additional Comments: L visual field deficit, R eye lid Ptosis Perception Perception: Within Functional Limits Praxis Praxis: WFL  Cognition Overall Cognitive Status: Impaired/Different from baseline Arousal/Alertness: Awake/alert Orientation Level: Oriented to place;Oriented to situation;Oriented to person;Disoriented to time Year: 2026 Month: September Day of Week: Incorrect Attention: Focused;Sustained Focused Attention: Appears intact Sustained Attention: Appears intact Memory: Impaired Memory Impairment: Storage deficit;Retrieval deficit;Decreased recall of new information;Decreased short term memory Decreased Short Term Memory: Verbal basic Awareness: Impaired Awareness Impairment: Emergent  impairment Problem Solving: Impaired Problem Solving Impairment: Verbal basic Executive Function: Decision Making;Self Correcting Decision Making: Impaired Self Correcting: Impaired Safety/Judgment: Impaired Sensation Sensation Light Touch: Appears Intact Coordination Gross Motor Movements are Fluid and Coordinated: No Fine Motor Movements are Fluid and Coordinated: No Coordination and Movement Description: slow to initiate, slow to correct even with vc/ tc Motor  Motor Motor: Other (comment) (hemipareisis) Motor - Skilled Clinical Observations: slow to initiate, slow to correct even with vc/ tc   Trunk/Postural Assessment  Cervical Assessment Cervical Assessment: Exceptions to [facility] (forward head with rounding to cervical spine) Thoracic Assessment Thoracic Assessment: Exceptions to [facility] (rounded shoulders and mild kyphosis) Lumbar Assessment Lumbar Assessment: Exceptions to [facility] (posterior pelvic tilt with decreased lordosis) Postural Control Postural Control: Deficits on evaluation Righting Reactions: delayed Protective Responses: delayed  Balance Balance Balance Assessed: Yes Static Sitting Balance Static Sitting - Balance Support: Feet supported Static Sitting - Level of Assistance: 5: Stand by assistance Dynamic Sitting Balance Dynamic Sitting - Balance Support: Feet supported;During functional activity Dynamic Sitting - Level of Assistance: 5: Stand by assistance;Other (comment) (CGA) Static Standing Balance Static Standing - Balance Support: Bilateral upper extremity supported;During functional activity Static Standing - Level of Assistance: Other (comment);5: Stand by assistance (CGA) Dynamic Standing Balance Dynamic Standing - Balance Support: During functional activity;Bilateral upper extremity supported Dynamic Standing - Level of Assistance: Other (comment);4: Min assist (CGA) Extremity Assessment      RLE Assessment RLE Assessment: Exceptions to [facility] RLE  Strength Right Hip Flexion: 4-/5 Right Hip Extension: 4-/5 Right Hip ABduction: 4/5 Right Hip ADduction: 4/5 Right Knee Flexion: 4-/5 Right Knee Extension: 4/5 Right Ankle Dorsiflexion: 4/5 Right Ankle Plantar Flexion: 4/5 LLE Assessment LLE Assessment: Exceptions to [facility] LLE Strength Left Hip Flexion: 4-/5 Left Hip Extension: 4-/5 Left Hip ABduction: 4-/5 Left Hip ADduction: 4-/5 Left Knee Flexion: 3+/5 Left Knee Extension: 4-/5 Left Ankle Dorsiflexion: 4-/5 Left Ankle Plantar Flexion: 4-/5  Care Tool Care Tool Bed Mobility Roll left and right activity   Roll left and right assist level: Supervision/Verbal cueing    Sit to lying activity   Sit to lying assist level: Supervision/Verbal cueing    Lying to sitting on side of bed activity   Lying to sitting on side of bed assist level: the ability to move from lying on the back to sitting on the side of the bed with no back support.: Supervision/Verbal cueing     Care Tool Transfers Sit to stand transfer   Sit to stand assist level: Minimal Assistance - Patient > 75%    Chair/bed transfer   Chair/bed transfer assist level: Minimal Assistance - Patient > 75%    Car  transfer   Car transfer assist level: Minimal Assistance - Patient > 75%;Contact Guard/Touching assist      Care Tool Locomotion Ambulation   Assist level: Minimal Assistance - Patient > 75% Assistive device: Walker-rolling Max distance: 30 ft  Walk 10 feet activity   Assist level: Minimal Assistance - Patient > 75% Assistive device: Walker-rolling   Walk 50 feet with 2 turns activity Walk 50 feet with 2 turns activity did not occur: Safety/medical concerns      Walk 150 feet activity Walk 150 feet activity did not occur: Safety/medical concerns      Walk 10 feet on uneven surfaces activity Walk 10 feet on uneven surfaces activity did not occur: Safety/medical concerns      Stairs Stair activity did not occur: Safety/medical concerns        Walk  up/down 1 step activity Walk up/down 1 step or curb (drop down) activity did not occur: Safety/medical concerns      Walk up/down 4 steps activity Walk up/down 4 steps activity did not occur: Safety/medical concerns      Walk up/down 12 steps activity Walk up/down 12 steps activity did not occur: Safety/medical concerns      Pick up small objects from floor   Pick up small object from the floor assist level: Moderate Assistance - Patient 50 - 74% Pick up small object from the floor assistive device: RW  Wheelchair Is the patient using a wheelchair?: Yes Type of Wheelchair: Manual   Wheelchair assist level: Supervision/Verbal cueing Max wheelchair distance: 50 ft  Wheel 50 feet with 2 turns activity   Assist Level: Minimal Assistance - Patient > 75%  Wheel 150 feet activity   Assist Level: Maximal Assistance - Patient 25 - 49%    Refer to Care Plan for Long Term Goals  SHORT TERM GOAL WEEK 1 PT Short Term Goal 1 (Week 1): STG = LTG d/t ELOS  Recommendations for other services: Therapeutic Recreation  Kitchen group and Stress management  Skilled Therapeutic Intervention Mobility Bed Mobility Bed Mobility: Supine to Sit;Sit to Supine;Sitting - Scoot to Edge of Bed Supine to Sit: Supervision/Verbal cueing Sitting - Scoot to Edge of Bed: Supervision/Verbal cueing Sit to Supine: Supervision/Verbal cueing Transfers Transfers: Sit to Stand;Stand to Sit;Stand Pivot Transfers Sit to Stand: Contact Guard/Touching assist Stand to Sit: Contact Guard/Touching assist Stand Pivot Transfers: Contact Guard/Touching assist Transfer (Assistive device): Rolling walker Locomotion  Gait Ambulation: Yes Gait Distance (Feet): 30 Feet Assistive device: Rolling walker Gait Gait: Yes Gait Pattern: Impaired Gait Pattern: Decreased step length - right;Decreased step length - left;Trunk flexed;Wide base of support;Poor foot clearance - left Gait velocity: decreased Stairs / Additional  Locomotion Stairs: No Ramp: Doctor, Hospital Mobility: Yes Wheelchair Assistance: Doctor, General Practice: Both upper extremities Wheelchair Parts Management: Needs assistance Distance: 50 ft straight path  Skilled Intervention: PT Evaluation completed; see above for results. PT educated patient in roles of PT vs OT, PT POC, rehab potential, rehab goals, and discharge recommendations along with recommendation for follow-up rehabilitation services. Individual treatment initiated:  Patient supine in bed and asleep upon PT arrival. Wife present. With gentle rousing, pt then alert and agreeable to PT session.   No pain complaint at start of session.  Therapeutic Activity: Bed mobility and transfers performed as noted above. Of note, pt does require MinA initially from EOB with vc for positioning and technique. Improves from armchair/ wheelchair. On initial stance, pt relates lightheadedness and need to sit.  Symptoms resolve in sitting and pt does not relate any more lightheadedness throughout session.   Completes car transfer later in session using RW with overall light CGA/ SBA.   Gait Training:  Patient ambulated 30 ft into hallway from room using RW with close w/c follow from wife and overall CGA/ MinA. Demonstrated flexed posture and hold of RW with outstretched arms. Wife reports pt is positioned like this at home despite vc from her re: posture. Pt also c/o arm fatigue and educated that arms will fatigue with increased pressure into RW. With upright posture, arms have less pressure into RW and will not have to work so hard to hold him up. Demos understanding. Provided vc/tc throughout for upright posture, midline positioning d/t R sided bias in walker.  Pt able to ambulate up/ down ramp requiring vc/ tc for directionality and turn to L. Requires slight increase in assist to descend d/t need for increased control of RW  with forward flexed posture and inability to shift weight more posteriorly for control.   Wheelchair Mobility:  Patient propelled wheelchair 55 feet with supervision using BUE. Relates ease of use of standard w/c compared to transport chair at home. Provided vc/tc for awareness of foot positioning and light drag on floor.   Patient supine in bed at end of session with brakes locked, bed alarm set, and all needs within reach.    Discharge Criteria: Patient will be discharged from PT if patient refuses treatment 3 consecutive times without medical reason, if treatment goals not met, if there is a change in medical status, if patient makes no progress towards goals or if patient is discharged from hospital.  The above assessment, treatment plan, treatment alternatives and goals were discussed and mutually agreed upon: by patient and by family  Mliss DELENA Milliner PT, DPT, CSRS 11/11/2024, 1:27 PM

## 2024-11-11 NOTE — Plan of Care (Signed)
" °  Problem: RH Balance Goal: LTG: Patient will maintain dynamic sitting balance (OT) Description: LTG:  Patient will maintain dynamic sitting balance with assistance during activities of daily living (OT) Flowsheets (Taken 11/11/2024 1607) LTG: Pt will maintain dynamic sitting balance during ADLs with: Independent with assistive device Goal: LTG Patient will maintain dynamic standing with ADLs (OT) Description: LTG:  Patient will maintain dynamic standing balance with assist during activities of daily living (OT)  Flowsheets (Taken 11/11/2024 1607) LTG: Pt will maintain dynamic standing balance during ADLs with: Supervision/Verbal cueing   Problem: Sit to Stand Goal: LTG:  Patient will perform sit to stand in prep for activites of daily living with assistance level (OT) Description: LTG:  Patient will perform sit to stand in prep for activites of daily living with assistance level (OT) Flowsheets (Taken 11/11/2024 1607) LTG: PT will perform sit to stand in prep for activites of daily living with assistance level: Independent with assistive device   Problem: RH Grooming Goal: LTG Patient will perform grooming w/assist,cues/equip (OT) Description: LTG: Patient will perform grooming with assist, with/without cues using equipment (OT) Flowsheets (Taken 11/11/2024 1607) LTG: Pt will perform grooming with assistance level of: Independent with assistive device    Problem: RH Bathing Goal: LTG Patient will bathe all body parts with assist levels (OT) Description: LTG: Patient will bathe all body parts with assist levels (OT) Flowsheets (Taken 11/11/2024 1607) LTG: Pt will perform bathing with assistance level/cueing: Contact Guard/Touching assist   Problem: RH Dressing Goal: LTG Patient will perform upper body dressing (OT) Description: LTG Patient will perform upper body dressing with assist, with/without cues (OT). Flowsheets (Taken 11/11/2024 1607) LTG: Pt will perform upper body dressing with  assistance level of: Independent with assistive device Goal: LTG Patient will perform lower body dressing w/assist (OT) Description: LTG: Patient will perform lower body dressing with assist, with/without cues in positioning using equipment (OT) Flowsheets (Taken 11/11/2024 1607) LTG: Pt will perform lower body dressing with assistance level of: Supervision/Verbal cueing   Problem: RH Toileting Goal: LTG Patient will perform toileting task (3/3 steps) with assistance level (OT) Description: LTG: Patient will perform toileting task (3/3 steps) with assistance level (OT)  Flowsheets (Taken 11/11/2024 1607) LTG: Pt will perform toileting task (3/3 steps) with assistance level: Supervision/Verbal cueing   Problem: RH Toilet Transfers Goal: LTG Patient will perform toilet transfers w/assist (OT) Description: LTG: Patient will perform toilet transfers with assist, with/without cues using equipment (OT) Flowsheets (Taken 11/11/2024 1607) LTG: Pt will perform toilet transfers with assistance level of: Supervision/Verbal cueing   Problem: RH Tub/Shower Transfers Goal: LTG Patient will perform tub/shower transfers w/assist (OT) Description: LTG: Patient will perform tub/shower transfers with assist, with/without cues using equipment (OT) Flowsheets (Taken 11/11/2024 1607) LTG: Pt will perform tub/shower stall transfers with assistance level of: Supervision/Verbal cueing   "

## 2024-11-11 NOTE — Discharge Summary (Signed)
 Physician Discharge Summary  Patient ID: Wayne Molina MRN: [id number] DOB/AGE: October 22, 1956 69 y.o.  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 Automated exposure control, iterative reconstruction, and/or weight based adjustment of the mA/kV was utilized to reduce the radiation dose to as low as reasonably achievable. COMPARISON: Head CT and MRI 11/03/2024. CLINICAL HISTORY: Glioblastoma; decreased mental acuity assess for increased edema. FINDINGS: BRAIN AND VENTRICLES: A heterogeneous, predominantly hypodense mass is again seen in the right temporoparietal region and involving the splenium of the corpus callosum consistent with the known glioblastoma. Small foci of calcification are present in this region, likely along with some nonacute blood products. The mass overall appears mildly smaller than on the prior CT. There is only at most very mild surrounding edema which has greatly decreased from prior. There is persistent mild mass effect on the right lateral ventricle. No acute intracranial hemorrhage, acute large territory infarct, midline shift, or extra-axial fluid collection is identified. There is no interval ventricular dilatation. A biopsy track is again noted in the posterior right temporal lobe. Calcified atherosclerosis at the skull  base. ORBITS: No acute abnormality. SINUSES: No acute abnormality. SOFT TISSUES AND SKULL: Right temporoparietal burr hole. No acute soft tissue abnormality. No skull fracture. IMPRESSION: 1. 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 No definite progressive or acute findings. Electronically signed by: Dasie Hamburg MD MD 11/15/2024 03:28 PM EST RP Workstation: [id number]   DG Chest 2 View Result Date: 11/11/2024 EXAM: 2 VIEW(S) XRAY OF THE CHEST 11/10/2024 06:20:00 PM COMPARISON: 11/03/2024 CLINICAL HISTORY: Cough FINDINGS: LINES, TUBES AND DEVICES: Left chest loop recorder in place. LUNGS AND PLEURA: No focal pulmonary opacity. No pleural effusion. No pneumothorax. HEART AND MEDIASTINUM: Stable mild cardiomegaly. Calcified aortic atherosclerosis. BONES AND SOFT TISSUES: No acute osseous abnormality. UPPER ABDOMEN: Paucity of bowel gas. IMPRESSION: 1. No acute findings. Electronically signed by: Franky Stanford MD 11/11/2024 02:06 AM EST RP Workstation: [id number]   MR BRAIN W WO CONTRAST Result Date: 11/03/2024 EXAM: MRI BRAIN WITH CONTRAST 11/03/2024 10:44:32 PM TECHNIQUE: Multiplanar multisequence MRI of the head/brain was performed with the administration of intravenous contrast. COMPARISON: MRI head 10/04/2024. CLINICAL HISTORY: Brain/CNS neoplasm, assess treatment response FINDINGS: BRAIN AND VENTRICLES: Continued increase in thickness of peripheral enhancement of a complex right temporoparietal mass .for example, peripheral enhancement measures 1.5 cm in thickness in the right temporal lobe on series 34, 36, previously 1.1 cm. Peripheral enhancement measures 1.2 cm in thickness posterior medial series 34, 41, previously 0.7 cm . Also, moderate increase in surrounding FLAIR hyperintensity in the right temporal, parietal, and corpus callosal white matter. No new acute intracranial hemorrhage. No mass effect or midline shift. No hydrocephalus. The sella is unremarkable. 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 Scattered small T2 hyperintensities elsewhere in the cerebral white matter bilaterally are unchanged and compatible with mild chronic small vessel ischemic disease. 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 ORBITS: No acute abnormality. SINUSES: No acute abnormality. BONES  AND SOFT TISSUES: Normal bone marrow signal and enhancement. No acute soft tissue abnormality. IMPRESSION: 1. Continued increase in peripheral enhancement of the known glioblastoma with increased surrounding FLAIR hyperintense signal, which could represent tumor progression and/or post-treatment change. Electronically signed by: Gilmore Molt 11/03/2024 11:42 PM EST RP Workstation: [id number]   DG Chest 2 View Result Date: 11/03/2024 CLINICAL DATA:  Weakness EXAM: DG CHEST 2V COMPARISON:  July 21, 2024 FINDINGS: Stable cardiomegaly. Both lungs are clear. The visualized skeletal structures are unremarkable. IMPRESSION: No active cardiopulmonary disease. Electronically Signed   By: Lynwood Landy Raddle M.D.   On: 11/03/2024 13:21   CT Head Wo Contrast Result Date: 11/03/2024 EXAM: CT HEAD WITHOUT CONTRAST _study_datetime_ TECHNIQUE: CT of the head was performed without the administration of intravenous contrast. Automated exposure control, iterative reconstruction, and/or weight based adjustment of the mA/kV was utilized to reduce the radiation dose to as low as reasonably achievable. COMPARISON: MRI 10/04/2024 and CT head 07/26/2024. CLINICAL HISTORY: FINDINGS: BRAIN AND VENTRICLES: Redemonstrated postsurgical changes of right parietal craniotomy for biopsy of known mass lesion. Similar appearance of postprocedure changes along the biopsy tract. There is no evidence of acute intracranial hemorrhage. Right parietal mass is overall slightly increased in size with slight increased involvement of the right splenium of the corpus callosum.  There are increased areas of vasogenic edema within the right parietal lobe extending into the posterior right temporal lobe. There is similar effacement of the atrium and occipital horn of the right lateral ventricle. No significant midline shift. Atherosclerosis at the skull base. Remote infarcts in the right cerebellum. No evidence of acute infarct. No hydrocephalus. No extra-axial collection. ORBITS: No acute abnormality. SINUSES: No acute abnormality. SOFT TISSUES AND SKULL: Soft tissue swelling in the left facial soft tissues concerning for possible contusion. No skull fracture. IMPRESSION: 1. Slightly increased size of the right parietal mass with increased involvement of the right splenium of the corpus callosum. Increased associated vasogenic edema in the right parietal lobe. Recommend MRI brain with and without contrast for further evaluation. 2. Soft tissue swelling in the left facial soft tissues, concerning for possible contusion. 3. Similar evolving post-biopsy changes in the right temporoparietal lobes. Electronically signed by: Donnice Mania MD 11/03/2024 12:48 PM EST RP Workstation: [id number]   CUP PACEART REMOTE DEVICE CHECK Result Date: 10/26/2024 ILR summary report received. Battery status OK. Normal device function. No new symptom, tachy, brady, or pause episodes. No new AF episodes. Monthly summary reports and ROV/PRN AB, CVRS   Labs:  Basic Metabolic Panel: Recent Labs  Lab 11/13/24 0607  NA 136  K 5.0  CL 101  CO2 27  GLUCOSE 234*  BUN 26*  CREATININE 0.83  CALCIUM  9.2    CBC: Recent Labs  Lab 11/13/24 0607  WBC 14.3*  NEUTROABS 10.8*  HGB 14.1  HCT 40.8  MCV 95.6  PLT 264    CBG: No results for input(s): GLUCAP in the last 168 hours.  Brief HPI:   Wayne Molina is a 69 y.o. 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 Norleen Sable complicated by seizures maintained on Keppra  and followed by medical oncology Dr. [name] receiving CIR 08/04/2024 - 08/09/2024 discharged to home ambulating contact-guard assist with  a walker.  Per chart review patient lives with spouse.  1 level home one-step to entry.  Modified independent with rolling walker prior to admission.  Wife did assist with some ADLs.  Presented 11/03/2024 with generalized weakness/lethargy as well as increasing left-sided weakness over several days.  Wife had reported fall day prior to admission.  It was noted patient recently taking off of steroids which was being used to treat his glioblastoma.  CT/MRI of the brain showed slightly increased size of the right parietal mass with increased involvement of the right splenium of the corpus callosum.  Increased associated vasogenic edema in the right parietal lobe.  Soft tissue swelling in the left facial soft tissue concerning for possible contusion.  Admission chemistries unremarkable except total protein 5.9 albumin 3.4 total bilirubin 1.8 troponin 60-47-66.  Hospital course follow-up medical oncology Dr. [name] suspect symptoms due to postradiation inflammation and Decadron  had been resumed indefinitely at 4 mg twice daily and strength overall clearly improved.  Plan was to discuss chemotherapy as an outpatient.  Chronic Eliquis  ongoing for history of atrial fibrillation.  Keppra  as indicated 1000 mg twice daily for seizure disorder.  Hospital course intermittent bouts of hematuria felt to be related to chronic Eliquis  latest urine study unremarkable.  Per patient report some urinary urgency urinalysis indicated no infection.  Elevated troponin felt to be related to demand ischemia  discussed with cardiology services no further workup indicated.  Reports increased cough over 2 days and monitored.  Palliative care consulted to establish goals of care.  Therapy evaluations completed due to patient's decreased functional mobility was admitted for a comprehensive rehab program.   Hospital Course: Wayne Molina was admitted to rehab 11/10/2024 for inpatient therapies to consist of PT, ST and OT at least three hours five days a week. Past admission physiatrist, therapy team and rehab RN have worked together to provide customized collaborative inpatient rehab.  Per patient's glioblastoma with vasogenic edema suspect symptoms due to postradiation inflammation with history of right craniotomy 08/06/2024.  Chronic Decadron  resumed and continued indefinitely.  Follow-up cranial CT scan 11/15/2024 shows greatly decreased edema.  No progressive or acute findings.  Plans to address chemotherapy as an outpatient with medical oncology Dr. [name].  Patient continued chronic Eliquis  for history of atrial fibrillation DVT no bleeding episodes and follow-up cardiology services maintained on Temodar  as directed.  Pain management with the use of oxycodone  as needed.  Mood stabilization with melatonin as well as continued Seroquel  nightly.  Patient was attending full therapies.  Seizure disorder with Keppra  1000 mg twice daily no seizure activity.  Blood pressure controlled with Coreg  12.5 mg twice daily monitored with increased mobility.  Lipitor ongoing for hyperlipidemia.  Bouts of constipation with MiraLAX  as indicated.  He did have a history of BPH with some hematuria maintained on Flomax  last urinalysis unremarkable.  He continued on Os-Cal for history of osteoporosis.   Blood pressures were monitored on TID basis and remained controlled and monitored   Rehab course: During patient's stay in rehab weekly team conferences were held to monitor patient's progress, set goals and discuss barriers to discharge.  At admission, patient required minimal assist ambulate 25 feet rolling walker minimal assist sit to stand  He/She  has had improvement in activity tolerance, balance, postural control as well as ability to compensate for deficits. He/She has had improvement in functional use RUE/LUE  and RLE/LLE as well as improvement in awareness.  Performs amatory transfer to toilet covering at [employer]  1 using rolling walker overall light contact-guard.  Ambulation performed back to his room using rolling walker maintaining contact-guard.  He did need some cues to attend to the left side.  During ADLs focused sessions on activity tolerance and dynamic balance as well as rolling walker management skills.  Transition to edge of bed with head of bed elevated with supervision.  Donned overhead shirt supervision.  Donned socks and shoes with supervision.  Stand pivot edge of bed using rolling walker minimal assist.  Contact-guard assist for balance.  SLP follow-up completed EMST using EMST75 for 5 sets of 5 at 10cmH2O with supervision cues to take rest breaks.  Follow-up for any dysphagia noting no overt penetration or aspiration.  Patient did benefit from minimal cues to orient date and SLP provided calendar visual aid to promote ongoing orientation throughout the day.  Patient did endorse specific difficulty with time and money based calculations, thus conducted verbally presented time based calculations were patient required max assist for working memory and problem-solving.  Full family teaching completed plan discharge to home.       Disposition:  Discharge disposition: 06-Home-Health Care Svc        Diet: Regular  Special Instructions: No driving smoking or alcohol  Follow-up medical oncology to discuss possible chemotherapy  Medications at discharge. 1.  Tylenol  as needed 2.  Eliquis  5 mg p.o. twice daily 3.  Lipitor 40 mg p.o. daily 4.  Calcium  carbonate 1500 mg twice daily 5.  Coreg  12.5 mg p.o. twice  daily 6.  Decadron  4 mg p.o. daily 7.  Keppra  1000 mg p.o. twice daily 8.  Melatonin 3 mg p.o. nightly 9.  Oxycodone  5 mg every 6 hours as needed pain 10.  Protonix  40 mg p.o. daily 11.  MiraLAX  daily 12.  Seroquel  25 mg p.o. nightly 13.  Neurontin  100 mg 3 times daily 14.  Magnesium  oxide 250 mg daily 15.  Multivitamin daily 10.  Temodar  take 2 capsules (280 mg total) by mouth daily.  Take for 5 days on, 23 days off.  Repeat every 28 days. 11.  Vitamin B12 1 capsule daily 12.  Flomax  0.4 mg daily 13.  Nystatin  topical powder twice daily  30-35 minutes were spent completing discharge summary and discharge planning     Follow-up Information     Kirsteins, Prentice BRAVO, MD Follow up.   Specialty: Physical Medicine and Rehabilitation Why: No formal follow-up needed Contact information: [address] [place] [address] [phone]         Janna Ferrier, DO Follow up.   Specialty: Family Medicine Why: Call for appointment Contact information: [address] [place] [address] [phone]         Vaslow, Zachary K, MD Follow up.   Specialties: Psychiatry, Neurology, Oncology Why: Call for appointment Contact information: [address] [place] [address] [phone]         Rosslyn Dino HERO, MD Follow up.   Specialty: Neurosurgery Why: As needed Contact information: [address] [place] [address] [phone]         Francyne Headland, MD Follow up.   Specialty: Cardiology Why: As needed Contact information: [address] [place] [address] [phone]                 Signed: Toribio PARAS Allysson Rinehimer 11/17/2024, 4:28 AM

## 2024-11-11 NOTE — Plan of Care (Signed)
" °  Problem: RH Swallowing Goal: LTG Patient will consume least restrictive diet using compensatory strategies with assistance (SLP) Description: LTG:  Patient will consume least restrictive diet using compensatory strategies with assistance (SLP) Flowsheets (Taken 11/11/2024 1225) LTG: Pt Patient will consume least restrictive diet using compensatory strategies with assistance of (SLP): Modified Independent   Problem: RH Cognition - SLP Goal: RH LTG Patient will demonstrate orientation with cues Description:  LTG:  Patient will demonstrate orientation to person/place/time/situation with cues (SLP)   Flowsheets (Taken 11/11/2024 1225) LTG Patient will demonstrate orientation to:  Person  Place  Time  Situation LTG: Patient will demonstrate orientation using cueing (SLP): Minimal Assistance - Patient > 75%   Problem: RH Expression Communication Goal: LTG Patient will verbally express basic/complex needs(SLP) Description: LTG:  Patient will verbally express basic/complex needs, wants or ideas with cues  (SLP) Flowsheets (Taken 11/11/2024 1225) LTG: Patient will verbally express basic/complex needs, wants or ideas (SLP): Supervision   Problem: RH Problem Solving Goal: LTG Patient will demonstrate problem solving for (SLP) Description: LTG:  Patient will demonstrate problem solving for basic/complex daily situations with cues  (SLP) Flowsheets (Taken 11/11/2024 1225) LTG: Patient will demonstrate problem solving for (SLP): Basic daily situations LTG Patient will demonstrate problem solving for: Minimal Assistance - Patient > 75%   Problem: RH Memory Goal: LTG Patient will demonstrate ability for day to day (SLP) Description: LTG:   Patient will demonstrate ability for day to day recall/carryover during cognitive/linguistic activities with assist  (SLP) Flowsheets (Taken 11/11/2024 1225) LTG: Patient will demonstrate ability for day to day recall: New information LTG: Patient will demonstrate  ability for day to day recall/carryover during cognitive/linguistic activities with assist (SLP): Minimal Assistance - Patient > 75%   Problem: RH Awareness Goal: LTG: Patient will demonstrate awareness during functional activites type of (SLP) Description: LTG: Patient will demonstrate awareness during functional activites type of (SLP) Flowsheets (Taken 11/11/2024 1225) Patient will demonstrate during cognitive/linguistic activities awareness type of: Emergent LTG: Patient will demonstrate awareness during cognitive/linguistic activities with assistance of (SLP): Minimal Assistance - Patient > 75%   "

## 2024-11-11 NOTE — Evaluation (Signed)
 Occupational Therapy Assessment and Plan  Patient Details  Name: AUBREY VOONG MRN: [id number] Date of Birth: [birth year]  OT Diagnosis: abnormal posture, ataxia, hemiplegia affecting dominant side, and muscle weakness (generalized) Rehab Potential: Rehab Potential (ACUTE ONLY): Good ELOS: 7 days   Today's Date: 11/11/2024 OT Individual Time: 1300-1415 OT Individual Time Calculation (min): 75 min     Hospital Problem: Principal Problem:   GBM (glioblastoma multiforme) (HCC)   Past Medical History:  Past Medical History:  Diagnosis Date   Anxiety    Arthritis    fingers   Chronic atrial fibrillation (HCC)    Depression    Family history of breast cancer    Family history of cancer of male genital organ    Family history of gene mutation    BRIP1   Family history of malignant neoplasm of gastrointestinal tract    History of radiation therapy    08-23-24 - 09-12-24 Brain (parietal lobe) Dr. Lauraine Golden, MD   Hyperlipidemia    Hypertension    Personal history of colonic polyps 11/12/2006   Sleep apnea    Past Surgical History:  Past Surgical History:  Procedure Laterality Date   2-D echocardiogram  07/22/2010   Ejection fraction greater than 55%. Left atrium moderately dilated. Right atrium moderately dilated. 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 Mild to Moderate MR. Mild to moderate TR.   APPLICATION OF CRANIAL NAVIGATION Right 07/17/2024   Procedure: COMPUTER-ASSISTED NAVIGATION, FOR CRANIAL PROCEDURE;  Surgeon: Rosslyn Dino HERO, MD;  Location: [facility] OR;  Service: Neurosurgery;  Laterality: Right;  CRANIAL NAVIGATION   ATRIAL ABLATION SURGERY  11/09/2002   EP IMPLANTABLE DEVICE N/A 02/11/2016   Procedure: Loop Recorder Insertion;  Surgeon: Jerel Balding, MD;  Location: [facility] INVASIVE CV LAB;  Service: Cardiovascular;  Laterality: N/A;   Exercise Myoview stress test  07/08/2000   Nonischemic low-risk.   HIP FRACTURE SURGERY  1970s   IR GENERIC HISTORICAL  01/21/2017   IR  RADIOLOGIST EVAL & MGMT 01/21/2017 [facility]INTER[facility] RAD   STERIOTACTIC STIMULATOR INSERTION Right 07/17/2024   Procedure: OPEN CRANIOTOMY FOR BIOPSY;  Surgeon: Rosslyn Dino HERO, MD;  Location: [facility] OR;  Service: Neurosurgery;  Laterality: Right;  RIGHT STEREOTACTIC BRAIN BIOPSY    Assessment & Plan Clinical Impression: Patient is a 69 y.o. 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 Rosslyn complicated by seizures maintained on Keppra  and followed by medical oncology Dr. [name] and received CIR 08/04/2024 - 08/09/2024 discharged to home ambulating contact-guard assist with walker. Per chart review patient lives with spouse. 1 level home one-step to entry. Modified independent with rolling walker prior to admission. Wife did assist with ADLs. Presented 11/03/2024 with generalized weakness/lethargy as well as increasing left-sided weakness over several days . Wife had reported fall day prior to admission.. It was noted patient recently taken off of steroids which were being used to treat his glioblastoma. CT/MRI of the brain showed slightly increased size of the right parietal mass with increased involvement of the right splenium of the corpus callosum. Increased associated vasogenic edema in the right parietal lobe. Soft tissue swelling in the left facial soft tissues concerning for possible contusion. Admission chemistries unremarkable except total protein 5.9, albumin 3.4, total bilirubin 1.8,  urinalysis negative nitrite, troponin 60-47-66. Hospital course follow-up medical oncology Dr. [name] suspect symptoms due to postradiation  inflammation and Decadron  has been resumed indefinitely 4 mg twice daily and strength overall has clearly improved. Plan to discuss chemotherapy as outpatient. Chronic Eliquis  ongoing for history of atrial fibrillation. Keppra  as indicated 1000 mg twice daily. Hospital course intermittent bouts of hematuria felt to be related to chronic Eliquis  latest urine study unremarkable. Pt reports some urinary urgency, UA yesterday did not indicate infection. Elevated troponin felt to be related to demand ischemia per curb consult with cardiology services with noted history of atypical variant hypertrophic cardiomyopathy. Reports increased occasional cough past 2 days. Palliative care consulted to establish goals of care. Tolerating a regular consistency diet. Therapy evaluations completed due to patient's decreased functional mobility was admitted for a comprehensive rehab program.   Patient currently requires max with basic self-care skills secondary to muscle weakness, decreased cardiorespiratoy endurance, ataxia, decreased coordination, and decreased motor planning, decreased awareness, decreased problem solving, and decreased safety awareness, and decreased sitting balance, decreased standing balance, decreased postural control, and decreased balance strategies.  Prior to hospitalization, patient could complete ADLs with supervision.  Patient will benefit from skilled intervention to decrease level of assist with basic self-care skills and increase independence with basic self-care skills prior to discharge home with care partner.  Anticipate patient will require 24 hour supervision and follow up home health.  OT - End of Session Activity Tolerance: Tolerates 30+ min activity with multiple rests Endurance Deficit: Yes OT Assessment Rehab Potential (ACUTE ONLY): Good OT Barriers to Discharge:  (increased weakness neeidng increased caregiver support) OT Patient demonstrates impairments in the following area(s):  Balance;Sensory;Behavior;Cognition;Motor;Endurance;Perception;Safety OT Basic ADL's Functional Problem(s): Bathing;Dressing;Toileting;Grooming OT Advanced ADL's Functional Problem(s): None OT Transfers Functional Problem(s): Toilet;Tub/Shower OT Additional Impairment(s): None OT Plan OT Intensity: Minimum of 1-2 x/day, 45 to 90 minutes OT Frequency: 5 out of 7 days OT Duration/Estimated Length of Stay: 7 days OT Treatment/Interventions: Balance/vestibular training;Disease mangement/prevention;Neuromuscular re-education;Therapeutic Exercise;Wheelchair propulsion/positioning;Discharge planning;Therapeutic Activities;Visual/perceptual remediation/compensation;Psychosocial support;Functional mobility training;Community reintegration;Patient/family education;UE/LE Coordination activities;Splinting/orthotics;UE/LE Strength taining/ROM;Skin care/wound managment;Pain management;DME/adaptive equipment instruction;Cognitive remediation/compensation OT Self Feeding Anticipated Outcome(s): mod I OT Basic Self-Care Anticipated Outcome(s): CG OT Toileting Anticipated Outcome(s): CG OT Bathroom Transfers Anticipated Outcome(s): SUP   OT Evaluation Precautions/Restrictions  Precautions Precautions: Fall Precaution/Restrictions Comments: L weakness, R eye ptosis Restrictions Weight Bearing Restrictions Per Provider Order: No General Chart Reviewed: Yes PT Missed Treatment Reason: Not applicable Response to Previous Treatment: Not applicable Family/Caregiver Present: Yes Vital Signs   Pain Pain Assessment Pain Scale: 0-10 Pain Score: 0-No pain Home Living/Prior Functioning Home Living Family/patient expects to be discharged to:: Private residence Living Arrangements: Spouse/significant other Available Help at Discharge: Family, Available 24 hours/day Type of Home: House Home Access: Ramped entrance Entrance Stairs-Number of Steps: 1 threshold step Entrance Stairs-Rails: None Home Layout: One  level Bathroom Shower/Tub: Engineer, Manufacturing Systems: Handicapped height Bathroom Accessibility: Yes Additional Comments: shower chair  Lives With: Spouse IADL History Homemaking Responsibilities: No Current License: Yes Mode of Transportation: Set Designer Occupation: Retired Prior Function Level of Independence: Requires assistive device for independence, Independent with transfers, Independent with gait, Independent with basic ADLs  Able to Take Stairs?: No Driving: No Vocation: Retired Administrator, Sports Baseline Vision/History: 0 No visual deficits Ability to See in Adequate Light: 0 Adequate Patient Visual Report: No change from baseline Vision Assessment?: Yes Eye Alignment: Within Functional Limits Ocular Range of Motion: Restricted on the right;Restricted on the left;Restricted looking down;Impaired-to be further tested  in functional context Alignment/Gaze Preference: Other (comment) Tracking/Visual Pursuits: Decreased smoothness of horizontal tracking;Decreased smoothness of vertical tracking;Decreased smoothness of eye movement to LEFT inferior field;Decreased smoothness of eye movement to RIGHT inferior field;Other (comment) Saccades: Undershoots;Other (comment) Visual Fields: Left visual field deficit Additional Comments: L visual field deficit, R eye lid Ptosis Perception  Perception: Within Functional Limits Praxis Praxis: WFL Cognition Cognition Overall Cognitive Status: Impaired/Different from baseline Arousal/Alertness: Awake/alert Memory: Impaired Memory Impairment: Storage deficit;Retrieval deficit;Decreased recall of new information;Decreased short term memory Decreased Short Term Memory: Verbal basic Attention: Focused;Sustained Focused Attention: Appears intact Sustained Attention: Appears intact Awareness: Impaired Awareness Impairment: Emergent impairment Problem Solving: Impaired Problem Solving Impairment: Verbal basic Executive Function: Decision Making;Self  Correcting Decision Making: Impaired Self Correcting: Impaired Safety/Judgment: Impaired Brief Interview for Mental Status (BIMS) Repetition of Three Words (First Attempt): 3 Temporal Orientation: Year: Missed by more than 5 years Temporal Orientation: Month: Accurate within 5 days Temporal Orientation: Day: No answer Recall: Sock: Yes, no cue required Recall: Blue: Yes, no cue required Recall: Bed: Yes, after cueing (a piece of furniture) BIMS Summary Score: 10 Sensation Sensation Light Touch: Appears Intact Coordination Gross Motor Movements are Fluid and Coordinated: No Fine Motor Movements are Fluid and Coordinated: No Coordination and Movement Description: slow to initiate, slow to correct even with vc/ tc Motor  Motor Motor: Other (comment) (hemiparesis) Motor - Skilled Clinical Observations: slow to initiate, slow to correct even with vc/ tc  Trunk/Postural Assessment  Cervical Assessment Cervical Assessment: Exceptions to [facility] (forward head) Thoracic Assessment Thoracic Assessment: Exceptions to [facility] Lumbar Assessment Lumbar Assessment: Exceptions to [facility] Postural Control Postural Control: Deficits on evaluation Righting Reactions: delayed Protective Responses: delayed  Balance Balance Balance Assessed: Yes Static Sitting Balance Static Sitting - Balance Support: Feet supported Static Sitting - Level of Assistance: 5: Stand by assistance Dynamic Sitting Balance Dynamic Sitting - Balance Support: Feet supported;During functional activity Dynamic Sitting - Level of Assistance: 4: Min assist;Other (comment) (CGA) Static Standing Balance Static Standing - Balance Support: Bilateral upper extremity supported;During functional activity Static Standing - Level of Assistance: Other (comment);5: Stand by assistance Dynamic Standing Balance Dynamic Standing - Balance Support: During functional activity;Bilateral upper extremity supported Dynamic Standing - Level of  Assistance: 4: Min assist;3: Mod assist Extremity/Trunk Assessment RUE Assessment RUE Assessment: Within Functional Limits LUE Assessment LUE Assessment: Within Functional Limits Active Range of Motion (AROM) Comments: WFL General Strength Comments: 4-/5  Care Tool Care Tool Self Care Eating   Eating Assist Level: Set up assist    Oral Care    Oral Care Assist Level: Supervision/Verbal cueing    Bathing   Body parts bathed by patient: Right arm;Chest;Left arm;Abdomen;Front perineal area;Face;Left lower leg;Right lower leg;Buttocks;Right upper leg;Left upper leg Body parts bathed by helper: Buttocks;Left lower leg;Right lower leg;Front perineal area;Right arm;Left arm   Assist Level: Maximal Assistance - Patient 24 - 49%    Upper Body Dressing(including orthotics)   What is the patient wearing?: Pull over shirt   Assist Level: Minimal Assistance - Patient > 75%    Lower Body Dressing (excluding footwear)   What is the patient wearing?: Underwear/pull up;Pants Assist for lower body dressing: Maximal Assistance - Patient 25 - 49%    Putting on/Taking off footwear   What is the patient wearing?: Socks Assist for footwear: Total Assistance - Patient < 25%       Care Tool Toileting Toileting activity   Assist for toileting: Moderate Assistance - Patient 50 - 74%  Care Tool Bed Mobility Roll left and right activity   Roll left and right assist level: Supervision/Verbal cueing    Sit to lying activity   Sit to lying assist level: Supervision/Verbal cueing    Lying to sitting on side of bed activity   Lying to sitting on side of bed assist level: the ability to move from lying on the back to sitting on the side of the bed with no back support.: Supervision/Verbal cueing     Care Tool Transfers Sit to stand transfer   Sit to stand assist level: Minimal Assistance - Patient > 75%    Chair/bed transfer   Chair/bed transfer assist level: Minimal Assistance - Patient >  75%     Toilet transfer   Assist Level: Minimal Assistance - Patient > 75%     Care Tool Cognition  Expression of Ideas and Wants Expression of Ideas and Wants: 4. Without difficulty (complex and basic) - expresses complex messages without difficulty and with speech that is clear and easy to understand  Understanding Verbal and Non-Verbal Content Understanding Verbal and Non-Verbal Content: 4. Understands (complex and basic) - clear comprehension without cues or repetitions   Memory/Recall Ability Memory/Recall Ability : That he or she is in a hospital/hospital unit   Refer to Care Plan for Long Term Goals  SHORT TERM GOAL WEEK 1 OT Short Term Goal 1 (Week 1): STG= LTG d/t ELOS  Recommendations for other services: None    Skilled Therapeutic Intervention Evaluation completed (see details above) with education on OT POC and goals and individual treatment initiated with focus on functional mobility/transfers, ADL re-training,  generalized strengthening and endurance, dynamic standing balance/coordination, pt/family education and discharge planning. Pt education provided on therapy schedule and safety policy with use of chair alarm. Pt participated in goal setting. Pt participated in modified ADL task (See above for functional performance).  Session note: patient received in room in bed. Patient completed UB/ LB dressing, footwear, toileitng with levels noted. Patient required assist due to poor balance with posterior bias noted with toileting, LB dressing,  UB dressing. Patient then completed non supported standing static to increase balance for ADL completion  ADL ADL Equipment Provided: Reacher Eating: Supervision/safety Where Assessed-Eating: Bed level Grooming: Supervision/safety Where Assessed-Grooming: Standing at sink Upper Body Bathing: Minimal assistance Where Assessed-Upper Body Bathing: Edge of bed Lower Body Bathing: Maximal assistance;Moderate assistance Where  Assessed-Lower Body Bathing: Edge of bed Upper Body Dressing: Moderate assistance Where Assessed-Upper Body Dressing: Edge of bed Lower Body Dressing: Maximal assistance Where Assessed-Lower Body Dressing: Edge of bed Toileting: Moderate assistance Where Assessed-Toileting: Teacher, Adult Education: Curator Method: Proofreader: Grab bars;Raised Copy: Insurance Underwriter Method: Designer, Industrial/product: Shower seat with back Mobility  Bed Mobility Bed Mobility: Supine to Sit;Sit to Supine;Sitting - Scoot to Edge of Bed Supine to Sit: Supervision/Verbal cueing Sitting - Scoot to Edge of Bed: Supervision/Verbal cueing Sit to Supine: Supervision/Verbal cueing Transfers Sit to Stand: Contact Guard/Touching assist Stand to Sit: Contact Guard/Touching assist   Discharge Criteria: Patient will be discharged from OT if patient refuses treatment 3 consecutive times without medical reason, if treatment goals not met, if there is a change in medical status, if patient makes no progress towards goals or if patient is discharged from hospital.  The above assessment, treatment plan, treatment alternatives and goals were discussed and mutually agreed upon: by patient  D'mariea L Velta Rockholt 11/11/2024, 2:13 PM

## 2024-11-11 NOTE — Discharge Instructions (Signed)
 Inpatient Rehab Discharge Instructions  Wayne Molina Discharge date and time: No discharge date for patient encounter.   Activities/Precautions/ Functional Status: Activity: As tolerated Diet: Regular Wound Care: Routine skin checks Functional status:  ___ No restrictions     ___ Walk up steps independently ___ 24/7 supervision/assistance   ___ Walk up steps with assistance ___ Intermittent supervision/assistance  ___ Bathe/dress independently ___ Walk with walker     _x__ Bathe/dress with assistance ___ Walk Independently    ___ Shower independently ___ Walk with assistance    ___ Shower with assistance ___ No alcohol     ___ Return to work/school ________  Special Instructions: No driving smoking or alcohol   My questions have been answered and I understand these instructions. I will adhere to these goals and the provided educational materials after my discharge from the hospital.  Patient/Caregiver Signature _______________________________ Date __________  Clinician Signature _______________________________________ Date __________  Please bring this form and your medication list with you to all your follow-up doctor's appointments.

## 2024-11-11 NOTE — Evaluation (Signed)
 Speech Language Pathology Assessment and Plan  Patient Details  Name: Wayne Molina MRN: [id number] Date of Birth: 1956/05/06  SLP Diagnosis: Voice disorder;Cognitive Impairments;Dysphagia  Rehab Potential: Good ELOS: 7-10 days    Today's Date: 11/11/2024 SLP Individual Time: 1100-1159 SLP Individual Time Calculation (min): 59 min   Hospital Problem: Principal Problem:   GBM (glioblastoma multiforme) (HCC)  Past Medical History:  Past Medical History:  Diagnosis Date   Anxiety    Arthritis    fingers   Chronic atrial fibrillation (HCC)    Depression    Family history of breast cancer    Family history of cancer of male genital organ    Family history of gene mutation    BRIP1   Family history of malignant neoplasm of gastrointestinal tract    History of radiation therapy    08-23-24 - 09-12-24 Brain (parietal lobe) Dr. Lauraine Golden, MD   Hyperlipidemia    Hypertension    Personal history of colonic polyps 11/12/2006   Sleep apnea    Past Surgical History:  Past Surgical History:  Procedure Laterality Date   2-D echocardiogram  07/22/2010   Ejection fraction greater than 55%. Left atrium moderately dilated. Right atrium moderately dilated. 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 Mild to Moderate MR. Mild to moderate TR.   APPLICATION OF CRANIAL NAVIGATION Right 07/17/2024   Procedure: COMPUTER-ASSISTED NAVIGATION, FOR CRANIAL PROCEDURE;  Surgeon: Wayne Dino HERO, MD;  Location: [facility] OR;  Service: Neurosurgery;  Laterality: Right;  CRANIAL NAVIGATION   ATRIAL ABLATION SURGERY  11/09/2002   EP IMPLANTABLE DEVICE N/A 02/11/2016   Procedure: Loop Recorder Insertion;  Surgeon: Jerel Balding, MD;  Location: [facility] INVASIVE CV LAB;  Service: Cardiovascular;  Laterality: N/A;   Exercise Myoview stress test  07/08/2000   Nonischemic low-risk.   HIP FRACTURE SURGERY  1970s   IR GENERIC HISTORICAL  01/21/2017   IR RADIOLOGIST EVAL & MGMT 01/21/2017 [facility]-INTER[facility] RAD   STERIOTACTIC STIMULATOR  INSERTION Right 07/17/2024   Procedure: OPEN CRANIOTOMY FOR BIOPSY;  Surgeon: Wayne Dino HERO, MD;  Location: [facility] OR;  Service: Neurosurgery;  Laterality: Right;  RIGHT STEREOTACTIC BRAIN BIOPSY    Assessment / Plan / Recommendation Clinical Impression HPI: : Pt is a 69 year old male with medical hx significant for: brain CA/glioblastoma, HTN, hyperlipidemia, chronic A-fib. Pt presented to [facility] on 11/03/24 d/t generalized weakness x1 week. Pt also with decreased PO intake. Pt fell on day of presentation. CT showed slightly increased size of right parietal mass with increased involvement of right splenium of corpus callosum. Increased vasogenic edema in right parietal lobe. Given Decadron . MRI showed either progression of disease or post-treatment effect. Oncology consulted and recommended continuing Decadron  and no inpatient chemotherapy. Palliative Medicine consulted. UA on 1/1/ negative for infection. Therapy evaluations completed and CIR recommended d/t pt's deficits with functional mobility.   Clinical Impression:  Bedside Swallow Evaluation: A bedside swallow evaluation was completed to assess for s/sx of oropharyngeal dysphagia. Oral mechanism exam WFL. POs administered included thin liquids via straw, puree and solids. Patient with timely mastication and complete oral clearance. No s/sx of aspiration present this date, however patient with known hx of dysphagia and intermittent silent aspiration (last MBS 07/28/24). Patient and family are aware of aspiration risk and patient with no hx of PNA w/ clear CXR completed yesterday. Recommend regular/thin diet with use of standardized precautions including sitting upright during PO, taking small bites/sips at a slow rate and thorough and frequent oral care. Continue to  assess for need of repeat MBS if indicated and patient/family is amendable. Cognitive Linguistic: Patient was evaluated via the Cognistat and informal measures to assess  cognitive linguistic functioning. Patient oriented to self, situation and location, however disoriented to time stating Sept of 2026. Patient with moderate deficits in STM and basic problem solving characterized by difficulty answering functional problem solving scenario questions and recalling events of AM. Patient with functional attention to task and awareness of medical sitaution. Receptive language is characterized by intact ability to follow 1-3 step commands, intact yes/no questions and repetition. Patient with increased use of filler words during conversation and reports some word finding difficulties, though with 100% accuracy during confrontational and reponsive naming. This is likely due to cognition (requiring extra processing time during conversation) rather than language. Patients speech continues to be characterized by reduced vocal intensity and respiratory strength with average sustained phonation times of 4 seconds. Patient reports improvements in vocal intensity and hoarseness compared to last admission through working with [facility] ST. Patient would benefit from targeting orientation, problem solving, short term memory, and respiratory support for speech during inpatient rehabilitation stay.  Pt would benefit from skilled ST services to maximize speech, cognition and dysphagia in order to maximize functional independence at d/c. Anticipate patient will require supervision at d/c and f/u SLP services.    Skilled Therapeutic Interventions          Patient evaluated using a standardized cognitive linguistic assessment and bedside swallow evaluation to assess current cognitive, communicative and swallowing function. See above for details.    SLP Assessment  Patient will need skilled Speech Lanaguage Pathology Services during CIR admission    Recommendations  SLP Diet Recommendations: Age appropriate regular solids;Thin Liquid Administration via: Straw;Cup Medication Administration: Whole meds  with liquid Supervision: Intermittent supervision to cue for compensatory strategies Compensations: Slow rate;Small sips/bites Postural Changes and/or Swallow Maneuvers: Seated upright 90 degrees Oral Care Recommendations: Oral care BID Patient destination: Home Follow up Recommendations: Home Health SLP Equipment Recommended: None recommended by SLP    SLP Frequency 3 to 5 out of 7 days   SLP Duration  SLP Intensity  SLP Treatment/Interventions 7-10 days  Minumum of 1-2 x/day, 30 to 90 minutes  Cognitive remediation/compensation;Cueing hierarchy;Dysphagia/aspiration precaution training;Functional tasks;Patient/family education;Speech/Language facilitation;Therapeutic Activities    Pain None reported   SLP Evaluation Cognition Overall Cognitive Status: Impaired/Different from baseline Arousal/Alertness: Awake/alert Orientation Level: Oriented to place;Oriented to situation;Oriented to person;Disoriented to time Year: 2026 Month: September Day of Week: Incorrect Attention: Focused;Sustained Focused Attention: Appears intact Sustained Attention: Appears intact Memory: Impaired Memory Impairment: Storage deficit;Retrieval deficit;Decreased recall of new information;Decreased short term memory Decreased Short Term Memory: Verbal basic Awareness: Impaired Awareness Impairment: Emergent impairment Problem Solving: Impaired Problem Solving Impairment: Verbal basic  Comprehension Auditory Comprehension Overall Auditory Comprehension: Appears within functional limits for tasks assessed Yes/No Questions: Within Functional Limits Commands: Within Functional Limits Expression Expression Primary Mode of Expression: Verbal Verbal Expression Overall Verbal Expression: Appears within functional limits for tasks assessed (increased use of filler words likely due to need for extra processing time during conversation) Oral Motor Oral Motor/Sensory Function Overall Oral Motor/Sensory  Function: Within functional limits Motor Speech Overall Motor Speech: Impaired at baseline (impaired before admission) Respiration: Impaired Level of Impairment: Phrase Phonation: Low vocal intensity;Hoarse Articulation: Impaired Level of Impairment: Conversation Intelligibility: Intelligibility reduced Conversation: 75-100% accurate Motor Planning: Within functional limits Effective Techniques: Increased vocal intensity  Care Tool Care Tool Cognition Ability to hear (with hearing aid or hearing appliances  if normally used Ability to hear (with hearing aid or hearing appliances if normally used): 0. Adequate - no difficulty in normal conservation, social interaction, listening to TV   Expression of Ideas and Wants Expression of Ideas and Wants: 4. Without difficulty (complex and basic) - expresses complex messages without difficulty and with speech that is clear and easy to understand   Understanding Verbal and Non-Verbal Content Understanding Verbal and Non-Verbal Content: 4. Understands (complex and basic) - clear comprehension without cues or repetitions  Memory/Recall Ability Memory/Recall Ability : That he or she is in a hospital/hospital unit    Bedside Swallowing Assessment General Previous Swallow Assessment: MBS 07/29/23 Diet Prior to this Study: Regular;Thin liquids (Level 0) Respiratory Status: Room air Behavior/Cognition: Alert;Cooperative;Pleasant mood Oral Cavity - Dentition: Edentulous;Dentures, not available Self-Feeding Abilities: Needs assist Patient Positioning: Upright in bed Baseline Vocal Quality: Low vocal intensity Volitional Cough: Strong Volitional Swallow: Able to elicit  Ice Chips Ice chips: Not tested Thin Liquid Thin Liquid: Within functional limits Presentation: Cup;Self Fed;Straw Nectar Thick Nectar Thick Liquid: Not tested Honey Thick Honey Thick Liquid: Not tested Puree Puree: Within functional limits Presentation: Self  Fed;Spoon Solid Solid: Within functional limits Presentation: Self Fed BSE Assessment Risk for Aspiration Impact on safety and function: Mild aspiration risk Other Related Risk Factors: History of dysphagia;Deconditioning  Short Term Goals: Week 1: SLP Short Term Goal 1 (Week 1): STG = LTG due to ELOS  Refer to Care Plan for Long Term Goals  Recommendations for other services: None   Discharge Criteria: Patient will be discharged from SLP if patient refuses treatment 3 consecutive times without medical reason, if treatment goals not met, if there is a change in medical status, if patient makes no progress towards goals or if patient is discharged from hospital.  The above assessment, treatment plan, treatment alternatives and goals were discussed and mutually agreed upon: by patient and by family  Lev Cervone M.A., CCC-SLP 11/11/2024, 12:22 PM

## 2024-11-11 NOTE — Plan of Care (Signed)
" °  Problem: RH Balance Goal: LTG Patient will maintain dynamic standing balance (PT) Description: LTG:  Patient will maintain dynamic standing balance with assistance during mobility activities (PT) Flowsheets (Taken 11/11/2024 1346) LTG: Pt will maintain dynamic standing balance during mobility activities with:: Supervision/Verbal cueing   Problem: Sit to Stand Goal: LTG:  Patient will perform sit to stand with assistance level (PT) Description: LTG:  Patient will perform sit to stand with assistance level (PT) Flowsheets (Taken 11/11/2024 1346) LTG: PT will perform sit to stand in preparation for functional mobility with assistance level: Independent with assistive device   Problem: RH Bed Mobility Goal: LTG Patient will perform bed mobility with assist (PT) Description: LTG: Patient will perform bed mobility with assistance, with/without cues (PT). Flowsheets (Taken 11/11/2024 1346) LTG: Pt will perform bed mobility with assistance level of: Independent with assistive device    Problem: RH Bed to Chair Transfers Goal: LTG Patient will perform bed/chair transfers w/assist (PT) Description: LTG: Patient will perform bed to chair transfers with assistance (PT). Flowsheets (Taken 11/11/2024 1346) LTG: Pt will perform Bed to Chair Transfers with assistance level: Set up assist    Problem: RH Car Transfers Goal: LTG Patient will perform car transfers with assist (PT) Description: LTG: Patient will perform car transfers with assistance (PT). Flowsheets (Taken 11/11/2024 1346) LTG: Pt will perform car transfers with assist:: Supervision/Verbal cueing   Problem: RH Furniture Transfers Goal: LTG Patient will perform furniture transfers w/assist (OT/PT) Description: LTG: Patient will perform furniture transfers  with assistance (OT/PT). Flowsheets (Taken 11/11/2024 1346) LTG: Pt will perform furniture transfers with assist:: Supervision/Verbal cueing   Problem: RH Ambulation Goal: LTG Patient will  ambulate in controlled environment (PT) Description: LTG: Patient will ambulate in a controlled environment, # of feet with assistance (PT). Flowsheets (Taken 11/11/2024 1346) LTG: Pt will ambulate in controlled environ  assist needed:: Supervision/Verbal cueing LTG: Ambulation distance in controlled environment: more than 75 ft using LRAD Goal: LTG Patient will ambulate in home environment (PT) Description: LTG: Patient will ambulate in home environment, # of feet with assistance (PT). Flowsheets (Taken 11/11/2024 1346) LTG: Pt will ambulate in home environ  assist needed:: Supervision/Verbal cueing LTG: Ambulation distance in home environment: up to 50 ft per bout using RW   Problem: RH Wheelchair Mobility Goal: LTG Patient will propel w/c in controlled environment (PT) Description: LTG: Patient will propel wheelchair in controlled environment, # of feet with assist (PT) Flowsheets (Taken 11/11/2024 1346) LTG: Pt will propel w/c in controlled environ  assist needed:: Supervision/Verbal cueing LTG: Propel w/c distance in controlled environment: using BLE up to 50 ft including all turns and avoiding all obstacles   Problem: RH Stairs Goal: LTG Patient will ambulate up and down stairs w/assist (PT) Description: LTG: Patient will ambulate up and down # of stairs with assistance (PT) Flowsheets (Taken 11/11/2024 1346) LTG: Pt will ambulate up/down stairs assist needed:: Contact Guard/Touching assist LTG: Pt will  ambulate up and down number of stairs: at least one curb step using LRAD to manuever in community   "

## 2024-11-12 DIAGNOSIS — D72829 Elevated white blood cell count, unspecified: Secondary | ICD-10-CM | POA: Diagnosis not present

## 2024-11-12 DIAGNOSIS — C719 Malignant neoplasm of brain, unspecified: Secondary | ICD-10-CM | POA: Diagnosis not present

## 2024-11-12 NOTE — Plan of Care (Signed)
  Problem: Consults Goal: RH GENERAL PATIENT EDUCATION Description: See Patient Education module for education specifics. Outcome: Progressing   Problem: RH BOWEL ELIMINATION Goal: RH STG MANAGE BOWEL WITH ASSISTANCE Description: STG Manage Bowel with mod I Assistance. Outcome: Progressing Goal: RH STG MANAGE BOWEL W/MEDICATION W/ASSISTANCE Description: STG Manage Bowel with Medication with mod I Assistance. Outcome: Progressing

## 2024-11-12 NOTE — Progress Notes (Signed)
 "                                                        PROGRESS NOTE   Subjective/Complaints:  Still has occ dry cough, no problems with swallowing intake is good ROS- neg CP, SOB, N/V/D Objective:   DG Chest 2 View Result Date: 11/11/2024 EXAM: 2 VIEW(S) XRAY OF THE CHEST 11/10/2024 06:20:00 PM COMPARISON: 11/03/2024 CLINICAL HISTORY: Cough FINDINGS: LINES, TUBES AND DEVICES: Left chest loop recorder in place. LUNGS AND PLEURA: No focal pulmonary opacity. No pleural effusion. No pneumothorax. HEART AND MEDIASTINUM: Stable mild cardiomegaly. Calcified aortic atherosclerosis. BONES AND SOFT TISSUES: No acute osseous abnormality. UPPER ABDOMEN: Paucity of bowel gas. IMPRESSION: 1. No acute findings. Electronically signed by: Franky Stanford MD 11/11/2024 02:06 AM EST RP Workstation: [id number]   Recent Labs    11/10/24 0220  WBC 11.7*  HGB 14.3  HCT 42.3  PLT 305   No results for input(s): NA, K, CL, CO2, GLUCOSE, BUN, CREATININE, CALCIUM  in the last 72 hours.  Intake/Output Summary (Last 24 hours) at 11/12/2024 0738 Last data filed at 11/12/2024 0520 Gross per 24 hour  Intake 972 ml  Output 775 ml  Net 197 ml        Physical Exam: Vital Signs Blood pressure (!) 148/75, pulse 84, temperature 98 F (36.7 C), temperature source Oral, resp. rate 18, height 5' 8 (1.727 m), weight 74.6 kg, SpO2 97%.  EENT- strabismus Left eye General: No acute distress Mood and affect are appropriate Heart: Regular rate and rhythm no rubs murmurs or extra sounds Lungs: Clear to auscultation, breathing unlabored, no rales or wheezes Abdomen: Positive bowel sounds, soft nontender to palpation, nondistended Extremities: No clubbing, cyanosis, or edema Skin: No evidence of breakdown, no evidence of rash Neurologic: Cranial nerves II through XII intact, motor strength is 5/5 in bilateral deltoid, bicep, tricep, grip, hip flexor, knee extensors, ankle dorsiflexor and plantar flexor Sensory  exam normal sensation to light touch  in bilateral upper and lower extremities Cerebellar exam normal finger to nose to finger as well as heel to shin in bilateral upper and lower extremities Musculoskeletal: Full range of motion in all 4 extremities. No joint swelling   Assessment/Plan: 1. Functional deficits which require 3+ hours per day of interdisciplinary therapy in a comprehensive inpatient rehab setting. Physiatrist is providing close team supervision and 24 hour management of active medical problems listed below. Physiatrist and rehab team continue to assess barriers to discharge/monitor patient progress toward functional and medical goals  Care Tool:  Bathing    Body parts bathed by patient: Right arm, Chest, Left arm, Abdomen, Front perineal area, Face, Left lower leg, Right lower leg, Buttocks, Right upper leg, Left upper leg   Body parts bathed by helper: Buttocks, Left lower leg, Right lower leg, Front perineal area, Right arm, Left arm     Bathing assist Assist Level: Maximal Assistance - Patient 24 - 49%     Upper Body Dressing/Undressing Upper body dressing   What is the patient wearing?: Pull over shirt    Upper body assist Assist Level: Minimal Assistance - Patient > 75%    Lower Body Dressing/Undressing Lower body dressing      What is the patient wearing?: Underwear/pull up, Pants     Lower body assist Assist for  lower body dressing: Maximal Assistance - Patient 25 - 49%     Toileting Toileting    Toileting assist Assist for toileting: Moderate Assistance - Patient 50 - 74%     Transfers Chair/bed transfer  Transfers assist     Chair/bed transfer assist level: Minimal Assistance - Patient > 75%     Locomotion Ambulation   Ambulation assist      Assist level: Minimal Assistance - Patient > 75% Assistive device: Walker-rolling Max distance: 30 ft   Walk 10 feet activity   Assist     Assist level: Minimal Assistance - Patient >  75% Assistive device: Walker-rolling   Walk 50 feet activity   Assist Walk 50 feet with 2 turns activity did not occur: Safety/medical concerns         Walk 150 feet activity   Assist Walk 150 feet activity did not occur: Safety/medical concerns         Walk 10 feet on uneven surface  activity   Assist Walk 10 feet on uneven surfaces activity did not occur: Safety/medical concerns         Wheelchair     Assist Is the patient using a wheelchair?: Yes Type of Wheelchair: Manual    Wheelchair assist level: Supervision/Verbal cueing Max wheelchair distance: 50 ft    Wheelchair 50 feet with 2 turns activity    Assist        Assist Level: Minimal Assistance - Patient > 75%   Wheelchair 150 feet activity     Assist      Assist Level: Maximal Assistance - Patient 25 - 49%   Blood pressure (!) 148/75, pulse 84, temperature 98 F (36.7 C), temperature source Oral, resp. rate 18, height 5' 8 (1.727 m), weight 74.6 kg, SpO2 97%.  Medical Problem List and Plan: 1. Functional deficits secondary to glioblastoma with vasogenic edema suspect symptoms due to postradiation inflammation with history of right craniotomy 08/06/2024.  Chronic Decadron  resumed.  Plan to address chemotherapy as outpatient per medical oncology Dr. [name]             -patient may shower             -ELOS/Goals: PT/OT/SLP supervision, ELOS 7d             -Admit to CIR 2.  Antithrombotics: -DVT/anticoagulation:  Pharmaceutical: Eliquis              -antiplatelet therapy: N/A 3. Pain Management: Oxycodone  as needed 4. Mood/Behavior/Sleep: Melatonin 3 mg nightly.  Provide emotional support             -antipsychotic agents: Seroquel  25 mg nightly 5. Neuropsych/cognition: This patient is not capable of making decisions on his own behalf. 6. Skin/Wound Care: Routine skin checks- Right arm IV site looks ok but no longer is using will d/c 7. Fluids/Electrolytes/Nutrition: Routine N and  outs with follow-up chemistries. 8.  Seizure disorder.  Keppra  1000 mg twice daily 9.  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  Continue Eliquis .  Cardiac rate controlled.  Follow-up cardiology services. Daily weight.  10.  Hypertension.  Coreg  12.5 mg twice daily.  Monitor with increased mobility Vitals:   11/11/24 2027 11/12/24 0446  BP: 130/84 (!) 148/75  Pulse: 81 84  Resp: 18 18  Temp:  98 F (36.7 C)  SpO2: 97% 97%   Fair control , HR in range 11.  Hyperlipidemia.  Lipitor 12.  Constipation.  MiraLAX  daily. LBM 11/10/24 13.  BPH/history of hematuria.  Check PVR.  Patient on Flomax . U/A 11/09/24 did not indicate infection  prior to admission but only taking it as needed 14.  Osteoporosis.  Os-Cal 1250 mg twice daily 15.  Cough. Check CXR      LOS: 2 days A FACE TO FACE EVALUATION WAS PERFORMED  Prentice FORBES Compton 11/12/2024, 7:38 AM     "

## 2024-11-13 ENCOUNTER — Other Ambulatory Visit: Payer: Self-pay

## 2024-11-13 ENCOUNTER — Other Ambulatory Visit (HOSPITAL_COMMUNITY): Payer: Self-pay

## 2024-11-13 DIAGNOSIS — I10 Essential (primary) hypertension: Secondary | ICD-10-CM | POA: Diagnosis not present

## 2024-11-13 DIAGNOSIS — R059 Cough, unspecified: Secondary | ICD-10-CM | POA: Diagnosis not present

## 2024-11-13 DIAGNOSIS — C719 Malignant neoplasm of brain, unspecified: Secondary | ICD-10-CM | POA: Diagnosis not present

## 2024-11-13 DIAGNOSIS — R350 Frequency of micturition: Secondary | ICD-10-CM

## 2024-11-13 DIAGNOSIS — N401 Enlarged prostate with lower urinary tract symptoms: Secondary | ICD-10-CM

## 2024-11-13 DIAGNOSIS — K5900 Constipation, unspecified: Secondary | ICD-10-CM

## 2024-11-13 LAB — CBC WITH DIFFERENTIAL/PLATELET
Abs Immature Granulocytes: 1.53 K/uL — ABNORMAL HIGH (ref 0.00–0.07)
Basophils Absolute: 0 K/uL (ref 0.0–0.1)
Basophils Relative: 0 %
Eosinophils Absolute: 0 K/uL (ref 0.0–0.5)
Eosinophils Relative: 0 %
HCT: 40.8 % (ref 39.0–52.0)
Hemoglobin: 14.1 g/dL (ref 13.0–17.0)
Immature Granulocytes: 11 %
Lymphocytes Relative: 7 %
Lymphs Abs: 1 K/uL (ref 0.7–4.0)
MCH: 33 pg (ref 26.0–34.0)
MCHC: 34.6 g/dL (ref 30.0–36.0)
MCV: 95.6 fL (ref 80.0–100.0)
Monocytes Absolute: 0.9 K/uL (ref 0.1–1.0)
Monocytes Relative: 6 %
Neutro Abs: 10.8 K/uL — ABNORMAL HIGH (ref 1.7–7.7)
Neutrophils Relative %: 76 %
Platelets: 264 K/uL (ref 150–400)
RBC: 4.27 MIL/uL (ref 4.22–5.81)
RDW: 13.5 % (ref 11.5–15.5)
WBC: 14.3 K/uL — ABNORMAL HIGH (ref 4.0–10.5)
nRBC: 0 % (ref 0.0–0.2)

## 2024-11-13 LAB — COMPREHENSIVE METABOLIC PANEL WITH GFR
ALT: 31 U/L (ref 0–44)
AST: 24 U/L (ref 15–41)
Albumin: 3.5 g/dL (ref 3.5–5.0)
Alkaline Phosphatase: 40 U/L (ref 38–126)
Anion gap: 8 (ref 5–15)
BUN: 26 mg/dL — ABNORMAL HIGH (ref 8–23)
CO2: 27 mmol/L (ref 22–32)
Calcium: 9.2 mg/dL (ref 8.9–10.3)
Chloride: 101 mmol/L (ref 98–111)
Creatinine, Ser: 0.83 mg/dL (ref 0.61–1.24)
GFR, Estimated: >60 mL/min
Glucose, Bld: 234 mg/dL — ABNORMAL HIGH (ref 70–99)
Potassium: 5 mmol/L (ref 3.5–5.1)
Sodium: 136 mmol/L (ref 135–145)
Total Bilirubin: 0.9 mg/dL (ref 0.0–1.2)
Total Protein: 5.7 g/dL — ABNORMAL LOW (ref 6.5–8.1)

## 2024-11-13 MED ORDER — ATORVASTATIN CALCIUM 40 MG PO TABS
40.0000 mg | ORAL_TABLET | Freq: Every day | ORAL | 3 refills | Status: DC
  Filled 2024-11-13: qty 90, 90d supply, fill #0

## 2024-11-13 MED ORDER — APIXABAN 5 MG PO TABS
5.0000 mg | ORAL_TABLET | Freq: Two times a day (BID) | ORAL | 3 refills | Status: DC
  Filled 2024-11-13: qty 60, 30d supply, fill #0

## 2024-11-13 NOTE — Progress Notes (Signed)
 Patient ID: Wayne Molina, male   DOB: 1956/10/28, 69 y.o.   MRN: [id number] Met with the patient and wife to review current medical situation, rehab process, team conference and plan of care. Patient noted appetite is good on steroids; wife concerned about appetite once off medications. Patient also expressed mind is better and feels better on steroids but notices a difference in his functional ability and mind when the med is tapered off. Reviewed secondary risks and medications. Reviewed concerns over feeling hot and constipation addressed. Declines need for appetite stimulant and notes insomnia addressed. Continue to follow along to address educational needs to facilitate preparation for discharge. Fredericka Barnie NOVAK

## 2024-11-13 NOTE — Progress Notes (Addendum)
 "                                                        PROGRESS NOTE   Subjective/Complaints: No new complaints or concerns this morning.  Continue to feel mentally foggy. ROS- neg fevers, chills, CP, SOB, N/V/D Objective:   No results found.  Recent Labs    11/13/24 0607  WBC 14.3*  HGB 14.1  HCT 40.8  PLT 264   Recent Labs    11/13/24 0607  NA 136  K 5.0  CL 101  CO2 27  GLUCOSE 234*  BUN 26*  CREATININE 0.83  CALCIUM  9.2    Intake/Output Summary (Last 24 hours) at 11/13/2024 0828 Last data filed at 11/12/2024 2046 Gross per 24 hour  Intake 177 ml  Output 250 ml  Net -73 ml        Physical Exam: Vital Signs Blood pressure (!) 149/84, pulse 79, temperature 98.1 F (36.7 C), resp. rate 18, height 5' 8 (1.727 m), weight 74.6 kg, SpO2 99%.  EENT- strabismus Left eye General: No acute distress, laying in bed appears comfortable Mood and affect are appropriate Heart: Regular rate and rhythm no rubs murmurs or extra sounds Lungs: Clear to auscultation, breathing unlabored, no rales or wheezes Abdomen: Positive bowel sounds, soft nontender to palpation, nondistended Extremities: No clubbing, cyanosis, or edema Skin: Warm and dry Neurologic: Cranial nerves II through XII intact, motor strength is 5/5 in bilateral deltoid, bicep, tricep, grip, hip flexor, knee extensors, ankle dorsiflexor and plantar flexor Sensory exam normal sensation to light touch  in bilateral upper and lower extremities Cerebellar exam normal finger to nose to finger as well as heel to shin in bilateral upper and lower extremities Musculoskeletal: Full range of motion in all 4 extremities. No joint swelling  Prior neuro assessment is c/w today's exam 11/13/2024.   Assessment/Plan: 1. Functional deficits which require 3+ hours per day of interdisciplinary therapy in a comprehensive inpatient rehab setting. Physiatrist is providing close team supervision and 24 hour management of active  medical problems listed below. Physiatrist and rehab team continue to assess barriers to discharge/monitor patient progress toward functional and medical goals  Care Tool:  Bathing    Body parts bathed by patient: Right arm, Chest, Left arm, Abdomen, Front perineal area, Face, Left lower leg, Right lower leg, Buttocks, Right upper leg, Left upper leg   Body parts bathed by helper: Buttocks, Left lower leg, Right lower leg, Front perineal area, Right arm, Left arm     Bathing assist Assist Level: Maximal Assistance - Patient 24 - 49%     Upper Body Dressing/Undressing Upper body dressing   What is the patient wearing?: Pull over shirt    Upper body assist Assist Level: Minimal Assistance - Patient > 75%    Lower Body Dressing/Undressing Lower body dressing      What is the patient wearing?: Underwear/pull up, Pants     Lower body assist Assist for lower body dressing: Maximal Assistance - Patient 25 - 49%     Toileting Toileting    Toileting assist Assist for toileting: Moderate Assistance - Patient 50 - 74%     Transfers Chair/bed transfer  Transfers assist     Chair/bed transfer assist level: Minimal Assistance - Patient > 75%     Locomotion  Ambulation   Ambulation assist      Assist level: Minimal Assistance - Patient > 75% Assistive device: Walker-rolling Max distance: 30 ft   Walk 10 feet activity   Assist     Assist level: Minimal Assistance - Patient > 75% Assistive device: Walker-rolling   Walk 50 feet activity   Assist Walk 50 feet with 2 turns activity did not occur: Safety/medical concerns         Walk 150 feet activity   Assist Walk 150 feet activity did not occur: Safety/medical concerns         Walk 10 feet on uneven surface  activity   Assist Walk 10 feet on uneven surfaces activity did not occur: Safety/medical concerns         Wheelchair     Assist Is the patient using a wheelchair?: Yes Type of  Wheelchair: Manual    Wheelchair assist level: Supervision/Verbal cueing Max wheelchair distance: 50 ft    Wheelchair 50 feet with 2 turns activity    Assist        Assist Level: Minimal Assistance - Patient > 75%   Wheelchair 150 feet activity     Assist      Assist Level: Maximal Assistance - Patient 25 - 49%   Blood pressure (!) 149/84, pulse 79, temperature 98.1 F (36.7 C), resp. rate 18, height 5' 8 (1.727 m), weight 74.6 kg, SpO2 99%.  Medical Problem List and Plan: 1. Functional deficits secondary to glioblastoma with vasogenic edema suspect symptoms due to postradiation inflammation with history of right craniotomy 08/06/2024.  Chronic Decadron  resumed.  Plan to address chemotherapy as outpatient per medical oncology Dr. [name]             -patient may shower             -ELOS/Goals: PT/OT/SLP supervision, ELOS 7d             - Okay to continue CIR 2.  Antithrombotics: -DVT/anticoagulation:  Pharmaceutical: Eliquis              -antiplatelet therapy: N/A 3. Pain Management: Oxycodone  as needed 4. Mood/Behavior/Sleep: Melatonin 3 mg nightly.  Provide emotional support             -antipsychotic agents: Seroquel  25 mg nightly 5. Neuropsych/cognition: This patient is not capable of making decisions on his own behalf. 6. Skin/Wound Care: Routine skin checks- Right arm IV site looks ok but no longer is using will d/c 7. Fluids/Electrolytes/Nutrition: Routine N and outs with follow-up chemistries. 8.  Seizure disorder.  Keppra  1000 mg twice daily 9.  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  Continue Eliquis .  Cardiac rate controlled.  Follow-up cardiology services. Daily weight.  Filed Weights   11/11/24 0500 11/12/24 0500 11/13/24 0500  Weight: 73.5 kg 74.6 kg 74.6 kg  HR stable  10.  Hypertension.  Coreg  12.5 mg twice daily.  Monitor with increased mobility Vitals:   11/12/24 1925 11/13/24 0400   BP: 121/80 (!) 149/84  Pulse: 76 79  Resp: 16 18  Temp: 98.2 F (36.8 C) 98.1 F (36.7 C)  SpO2: 97% 99%  1/5 BP fair control, continue current regimen and monitor 11.  Hyperlipidemia.  Lipitor 12.  Constipation.  MiraLAX  daily.  - LBM 1/5, continue current regimen 13.  BPH/history of hematuria.  Check PVR.  Patient on Flomax . U/A 11/09/24 did not indicate infection  prior to admission but only taking it as needed 1/5 improved,  denies recent hematuria, continent 14.  Osteoporosis.  Os-Cal 1250 mg twice daily 15.  Cough. Check CXR- no acute findings      LOS: 3 days A FACE TO FACE EVALUATION WAS PERFORMED  Murray Collier 11/13/2024, 8:28 AM     "

## 2024-11-13 NOTE — Plan of Care (Signed)
" °  Problem: Consults Goal: RH GENERAL PATIENT EDUCATION Description: See Patient Education module for education specifics. Outcome: Progressing Goal: Nutrition Consult-if indicated Outcome: Progressing   Problem: RH BOWEL ELIMINATION Goal: RH STG MANAGE BOWEL WITH ASSISTANCE Description: STG Manage Bowel with mod I Assistance. Outcome: Progressing Goal: RH STG MANAGE BOWEL W/MEDICATION W/ASSISTANCE Description: STG Manage Bowel with Medication with  mod I Assistance. Outcome: Progressing   Problem: RH BLADDER ELIMINATION Goal: RH STG MANAGE BLADDER WITH ASSISTANCE Description: STG Manage Bladder With mod I  Assistance Outcome: Progressing Goal: RH STG MANAGE BLADDER WITH MEDICATION WITH ASSISTANCE Description: STG Manage Bladder With Medication With Assistance. Outcome: Progressing   Problem: RH SAFETY Goal: RH STG ADHERE TO SAFETY PRECAUTIONS W/ASSISTANCE/DEVICE Description: STG Adhere to Safety Precautions With cues Assistance/Device. Outcome: Progressing   Problem: RH KNOWLEDGE DEFICIT GENERAL Goal: RH STG INCREASE KNOWLEDGE OF SELF CARE AFTER HOSPITALIZATION Description: Patient and wife will be able to manage care at discharge using educational resources for medications and care independently Outcome: Progressing   "

## 2024-11-13 NOTE — Progress Notes (Signed)
 Physical Therapy Session Note  Patient Details  Name: Wayne Molina MRN: [id number] Date of Birth: 1956/07/08  Today's Date: 11/13/2024 PT Individual Time: 1400-1502 PT Individual Time Calculation (min): 62 min   Short Term Goals: Week 1:  PT Short Term Goal 1 (Week 1): STG = LTG d/t ELOS  Skilled Therapeutic Interventions/Progress Updates:  Patient supine in bed on entrance to room. Patient alert and agreeable to PT session. Wife present.   Patient with no pain complaint at start of session.  Wife continues to have questions re: amount of Decadron  steroid that pt will be taking upon d/c. Is hopeful for decrease to 4mg / day total. Msg sent to nursing care manager for clarification of potential taper of current steroid prescription.   Therapeutic Activity: Bed Mobility: Pt performed supine > sit with supervision but requires vc for exiting on L side bed when pt moves to R side exit despite rail down on L side. Potentially d/t decreased vision to L side. At end of session, pt is able to return to supine with supervision and is also able to scoot self toward [facility] with setup of bed to more flat surface. Transfers: Pt performed sit<>stand transfers throughout session with SBA and noted intermittent use of Bil knee extension into seated surface for balance attainment. Stand pivot transfers throughout session with use of RW and CGA. Provided vc/ tc for directionality and RW mgmt.  Pt transported dependently to day room via w/c. Transfers to Nustep with stand pivot to R side. Requires MinA to place L foot into pedal. Pt guided in continuous reciprocation of BUE and BLE using NuStep L2 x 66min/ L3 x 51min/ L2 x29min with focus on maintaining pace with use of pace partner program and increasing focus of push into extension with LLE and push/ pull of LUE. He is able to maintain an average of 35 steps/ min throughout completing 550 steps over distance of 0.3 mi. Averages 1.4-1.5 METs during bout.  Gait  Training:  Pt ambulated 25' x1/ 38' x1/ 25' x1 using RW with CGA/ MinA. Demonstrated flexed forward posture with increased pressure from BUE into RW and slowly increasing distance of RW in front of pt. Provided vc/ tc for more upright posture with hands at low back and anterior shoulder, increased step height/ length, posterior pelvic tilt, improved proximity to RW with hand position just in front of pt. He is able to make all corrections  with vc/ tc but unable to maintain for longer periods. Noted that RW position is improved during final bout despite continued forward push of RW.   Patient supine in bed at end of session with brakes locked, bed alarm set, and all needs within reach. Wife hopeful for d/c home prior to Friday. Related need for training with wife as well as conference with team.    Therapy Documentation Precautions:  Precautions Precautions: Fall Precaution/Restrictions Comments: L weakness, R eye ptosis Restrictions Weight Bearing Restrictions Per Provider Order: No  Pain:  No pain related this session. Fatigue related and address with seated rest breaks during ambulation bouts.   Therapy/Group: Individual Therapy  Wayne Molina PT, DPT, CSRS 11/13/2024, 4:02 PM

## 2024-11-13 NOTE — Progress Notes (Signed)
 " Inpatient Rehabilitation Care Coordinator Assessment and Plan Patient Details  Name: Wayne Molina MRN: [id number] Date of Birth: 25-May-1956  Today's Date: 11/13/2024  Hospital Problems: Principal Problem:   GBM (glioblastoma multiforme) (HCC)  Past Medical History:  Past Medical History:  Diagnosis Date   Anxiety    Arthritis    fingers   Chronic atrial fibrillation (HCC)    Depression    Family history of breast cancer    Family history of cancer of male genital organ    Family history of gene mutation    BRIP1   Family history of malignant neoplasm of gastrointestinal tract    History of radiation therapy    08-23-24 - 09-12-24 Brain (parietal lobe) Dr. Lauraine Golden, MD   Hyperlipidemia    Hypertension    Personal history of colonic polyps 11/12/2006   Sleep apnea    Past Surgical History:  Past Surgical History:  Procedure Laterality Date   2-D echocardiogram  07/22/2010   Ejection fraction greater than 55%. Left atrium moderately dilated. Right atrium moderately dilated. 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 Mild to Moderate MR. Mild to moderate TR.   APPLICATION OF CRANIAL NAVIGATION Right 07/17/2024   Procedure: COMPUTER-ASSISTED NAVIGATION, FOR CRANIAL PROCEDURE;  Surgeon: Rosslyn Dino HERO, MD;  Location: [facility] OR;  Service: Neurosurgery;  Laterality: Right;  CRANIAL NAVIGATION   ATRIAL ABLATION SURGERY  11/09/2002   EP IMPLANTABLE DEVICE N/A 02/11/2016   Procedure: Loop Recorder Insertion;  Surgeon: Jerel Balding, MD;  Location: [facility] INVASIVE CV LAB;  Service: Cardiovascular;  Laterality: N/A;   Exercise Myoview stress test  07/08/2000   Nonischemic low-risk.   HIP FRACTURE SURGERY  1970s   IR GENERIC HISTORICAL  01/21/2017   IR RADIOLOGIST EVAL & MGMT 01/21/2017 [facility]-INTERV RAD   STERIOTACTIC STIMULATOR INSERTION Right 07/17/2024   Procedure: OPEN CRANIOTOMY FOR BIOPSY;  Surgeon: Rosslyn Dino HERO, MD;  Location: [facility];  Service: Neurosurgery;  Laterality: Right;  RIGHT  STEREOTACTIC BRAIN BIOPSY   Social History:  reports that he has quit smoking. His smoking use included cigarettes. He has never used smokeless tobacco. He reports that he does not drink alcohol and does not use drugs.  Family / Support Systems Marital Status: Married Patient Roles: Spouse, Other (Comment) (sibling) Spouse/Significant Other: Wayne Molina [phone] Other Supports: Wayne Molina-brother [phone] Anticipated Caregiver: Wife Ability/Limitations of Caregiver: None Caregiver Availability: 24/7 Family Dynamics: Close with sibling and has freinds. His main caregiver is his wife and she has been providing assist since was here in 08/2024. Wife reports it was going well until he was taken off of the steriods and then became very weak.  Social History Preferred language: English Religion: Holiness Cultural Background: NA Education: HS Health Literacy - How often do you need to have someone help you when you read instructions, pamphlets, or other written material from your doctor or pharmacy?: Always Writes: Yes Employment Status: Retired Date Retired/Disabled/Unemployed: 08/2024 Legal History/Current Legal Issues: NA Guardian/Conservator: None-according to MD pt is not fully capable of making his own decisions while here. Will look toward his wife who stays here with him to make any decisions while here   Abuse/Neglect Abuse/Neglect Assessment Can Be Completed: Yes Physical Abuse: Denies Verbal Abuse: Denies Sexual Abuse: Denies Exploitation of patient/patient's resources: Denies Self-Neglect: Denies  Patient response to: Social Isolation - How often do you feel lonely or isolated from those around you?: Never  Emotional Status Pt's affect, behavior and adjustment status: Pt lets his wife answer for him but does  answer with one word at times. Wife reports he was doing well until his steroids were discontinued and then re-started, but he is weak. He requires care now and was able to  ambulate around the house before the decline. Wife hopes he will get stronger here and more mobile. Recent Psychosocial Issues: health issues-glioblastoma-has had radiation treatments and chemo is on hold at this time Psychiatric History: Hx-depression takes medications feels helps but does not really answer the questions. Seems to be listening to the conversation. Substance Abuse History: NA  Patient / Family Perceptions, Expectations & Goals Pt/Family understanding of illness & functional limitations: Wife can explain his decline and feels now here on rehab he can get stronger and more mobile. She is willing to assist and does talk with the MD's involved in husband's care. Both are hopeful he will do well here and be home soon Premorbid pt/family roles/activities: husband, sibling, retiree, friend, etc Anticipated changes in roles/activities/participation: resume Pt/family expectations/goals: Pt states:  I'm glad I'm here.  Wife states:  I hope he can make progress while here and will stay here with him.   Community Resources [employer]: Other (Comment) [facility]) Premorbid Home Care/DME Agencies: Other (Comment) (rw, tub seat, wc, bsc) Transportation available at discharge: wife Is the patient able to respond to transportation needs?: Yes In the past 12 months, has lack of transportation kept you from medical appointments or from getting medications?: No In the past 12 months, has lack of transportation kept you from meetings, work, or from getting things needed for daily living?: No  Discharge Planning Living Arrangements: Spouse/significant other Support Systems: Spouse/significant other, Other relatives, Friends/neighbors, Children Type of Residence: Private residence Insurance Resources: Media Planner (specify) (Devoted health and Location Manager for [employer]) Financial Resources: Social Security, Family Support Financial Screen Referred: No Living Expenses: Own Money  Management: Spouse Does the patient have any problems obtaining your medications?: No Home Management: wife Patient/Family Preliminary Plans: Return home with wife who was assisting him prior to admission due to glioblastoma. She reports he got weaker once steroid stopped and have since been re-started. Pt is weak and needs to build his strength if he can prior to going home. Both familar with CIR sonce here in 08/2024. Will work on discharge needs. Care Coordinator Barriers to Discharge: Insurance for SNF coverage Care Coordinator Anticipated Follow Up Needs: HH/OP  Clinical Impression Pleasant gentleman who has declined since admission in October. His wife is very committed to him and stays here with him. Both aware of CIR process and will work on discharge needs. Personal fan ordered for pt due to feeling hot in his room and thermostat turned down for him  Wayne Molina 11/13/2024, 1:56 PM    "

## 2024-11-13 NOTE — Progress Notes (Signed)
 Inpatient Rehabilitation  Patient information reviewed and entered into eRehab system by [facility]. Karen Kays., CCC/SLP, PPS Coordinator.  Information including medical coding, functional ability and quality indicators will be reviewed and updated through discharge.

## 2024-11-13 NOTE — IPOC Note (Signed)
 Overall Plan of Care [facility]) Patient Details Name: Wayne Molina MRN: [id number] DOB: 10-30-1956  Admitting Diagnosis: GBM (glioblastoma multiforme) [facility])  Hospital Problems: Principal Problem:   GBM (glioblastoma multiforme) (HCC) Active Problems:   Cough   Benign prostatic hyperplasia with urinary frequency   Constipation     Functional Problem List: Nursing Bowel, Bladder, Endurance, Medication Management, Safety  PT Balance, Endurance, Motor, Perception, Safety, Skin Integrity  OT Balance, Sensory, Behavior, Cognition, Motor, Endurance, Perception, Safety  SLP Cognition, Linguistic, Nutrition  TR         Basic ADLs: OT Bathing, Dressing, Toileting, Grooming     Advanced  ADLs: OT None     Transfers: PT Bed Mobility, Car, Occupational Psychologist, Research Scientist (life Sciences): PT Ambulation, Stairs     Additional Impairments: OT None  SLP Swallowing, Social Cognition   Problem Solving, Memory, Awareness, Attention  TR      Anticipated Outcomes Item Anticipated Outcome  Self Feeding mod I  Swallowing  mod i   Basic self-care  CG  Toileting  CG   Bathroom Transfers SUP  Bowel/Bladder  manage bowel and bladder w mod I assist  Transfers  Mod I  Locomotion  supervision  Communication  mod i  Cognition  minA  Pain  n/a  Safety/Judgment  manage safety w cues   Therapy Plan: PT Intensity: Minimum of 1-2 x/day ,45 to 90 minutes PT Frequency: 5 out of 7 days PT Duration Estimated Length of Stay: 7- 10 days OT Intensity: Minimum of 1-2 x/day, 45 to 90 minutes OT Frequency: 5 out of 7 days OT Duration/Estimated Length of Stay: 7 days SLP Intensity: Minumum of 1-2 x/day, 30 to 90 minutes SLP Frequency: 3 to 5 out of 7 days SLP Duration/Estimated Length of Stay: 7-10 days   Team Interventions: Nursing Interventions Patient/Family Education, Bladder Management, Bowel Management, Disease Management/Prevention, Discharge Planning, Medication Management   PT interventions Ambulation/gait training, Community reintegration, DME/adaptive equipment instruction, Neuromuscular re-education, Psychosocial support, Stair training, UE/LE Strength taining/ROM, Wheelchair propulsion/positioning, Warden/ranger, Discharge planning, Pain management, Skin care/wound management, Therapeutic Activities, UE/LE Coordination activities, Cognitive remediation/compensation, Disease management/prevention, Functional mobility training, Patient/family education, Splinting/orthotics, Therapeutic Exercise, Visual/perceptual remediation/compensation  OT Interventions Balance/vestibular training, Disease mangement/prevention, Neuromuscular re-education, Therapeutic Exercise, Wheelchair propulsion/positioning, Discharge planning, Therapeutic Activities, Visual/perceptual remediation/compensation, Psychosocial support, Functional mobility training, Community reintegration, Equities Trader education, UE/LE Coordination activities, Splinting/orthotics, UE/LE Strength taining/ROM, Skin care/wound managment, Pain management, DME/adaptive equipment instruction, Cognitive remediation/compensation  SLP Interventions Cognitive remediation/compensation, Cueing hierarchy, Dysphagia/aspiration precaution training, Functional tasks, Patient/family education, Speech/Language facilitation, Therapeutic Activities  TR Interventions    SW/CM Interventions Discharge Planning, Psychosocial Support, Patient/Family Education   Barriers to Discharge MD  Medical stability  Nursing Decreased caregiver support 1 level ramped entry w spouse;modified independent with rolling walker prior to admission.  Wife did assist with ADLs  PT Decreased caregiver support, Incontinence, Wound Care, Insurance for SNF coverage    OT  (increased weakness neeidng increased caregiver support)    SLP      SW Insurance for SNF coverage     Team Discharge Planning: Destination: PT-Home ,OT- Home ,  SLP-Home Projected Follow-up: PT-Home health PT, Outpatient PT, 24 hour supervision/assistance, OT-  Home health OT, SLP-Home Health SLP Projected Equipment Needs: PT-To be determined, OT- Other (comment), SLP-None recommended by SLP Equipment Details: PT- , OT-has eqpt Patient/family involved in discharge planning: PT- Patient, Family member/caregiver,  OT-Patient, SLP-Patient, Family member/caregiver  MD ELOS: 7-10 days Medical Rehab Prognosis:  Excellent Assessment: The patient has been admitted for CIR therapies with the diagnosis of glioblastoma with vasogenic edema suspect symptoms due to postradiation inflammation with history of right craniotomy 08/06/2024 . The team will be addressing functional mobility, strength, stamina, balance, safety, adaptive techniques and equipment, self-care, bowel and bladder mgt, patient and caregiver education. Goals have been set at Mod I. Anticipated discharge destination is home.        See Team Conference Notes for weekly updates to the plan of care

## 2024-11-13 NOTE — Progress Notes (Signed)
 Speech Language Pathology Daily Session Note  Patient Details  Name: TRAVES MAJCHRZAK MRN: [id number] Date of Birth: [birth year]  Today's Date: 11/13/2024 SLP Individual Time: 1100-1159 SLP Individual Time Calculation (min): 59 min  Short Term Goals: Week 1: SLP Short Term Goal 1 (Week 1): STG = LTG due to ELOS  Skilled Therapeutic Interventions: Skilled therapy session focused on communication and cognitive goals. SLP facilitated session by evaluating patients maximum expiratory pressure. Patient with an average MEP of 20cm H2O. SLP initiated EMST at 10cm H2O in which patient completed 25 repetitions with minA. SLP then introduced memory notebook. Patient required modA to recall activities of AM with OT. SLP then targeted problem solving through completion of sorting game. Patient was prompted to sort cards according to shape, color and/or number. Patient required modA to complete game and recalled 2/3 directions at the end. Patient left in bed with alarm set and call bell in reach. Continue POC.  Pain Headache- nursing aware  Therapy/Group: Individual Therapy  Shaunte Weissinger M.A., CCC-SLP 11/13/2024, 7:45 AM

## 2024-11-13 NOTE — Progress Notes (Signed)
 Occupational Therapy Session Note  Patient Details  Name: Wayne Molina MRN: [id number] Date of Birth: [birth year]  Today's Date: 11/13/2024 OT Individual Time: 9069-8961 OT Individual Time Calculation (min): 68 min    Short Term Goals: Week 1:  OT Short Term Goal 1 (Week 1): STG= LTG d/t ELOS  Skilled Therapeutic Interventions/Progress Updates:     Pt received sitting up in bed with wife present in room. Pt presenting to be in good spirits receptive to skilled OT session reporting 0/10 pain- OT offering intermittent rest breaks, repositioning, and therapeutic support to optimize participation in therapy session. Wife and Pt expressing they would like to d/c soon. Educated on ELOS of 7 days, reason for keeping Pt in hospital to increase safety and readiness for d/c, and purpose of IPR with both Pt and wife verbalizing understanding. Wife expressed she is able to provide very light assistance (SUP-CGA) for mobility, however is unable to provide heavier levels of assistance. Plan to discuss d/t date in conference on Wednesday. Pt requesting to take shower this AM- focused session on ADL retraining and safety awareness. Supine > EOB SUP. Sit > stand to RW light Min A to power-up and verbal cues for hand placement. Posterior bias in standing, able to correct with MIN verbal and tactile cues. HE completed functional mobility to bathroom using RW and transferred to shower seat positioned in walk-in shower with light MIN A provided for balance and RW management- MAX verbal cues required for proximity to RW and trunk alignment. He sat on shower seat for entirety of shower for energy conservation with SUP provided for safety +increased time to allow for rest breaks. Dried self with MIN A following. Functional mobility completed back to room using RW with same level of assist. Applied lotion with assistance required for feet and OT assisted with donning new buttocks and heel pads to prevent skin breakdown. Noted  dry ring shaped patch on back of Pt's L calf with RN informed. Decreased trunk control sitting EOB with Pt leaning backwards, requiring MOD verbal cues to return trunk to midline using bed rail. Donned OH shirt with SUP and pants Min A to weave L LE. Pt fatigued at end of session returning to bed with SUP. Pt was left resting in bed with call bell in reach, bed alarm on, and all needs met.    Therapy Documentation Precautions:  Precautions Precautions: Fall Precaution/Restrictions Comments: L weakness, R eye ptosis Restrictions Weight Bearing Restrictions Per Provider Order: No   Therapy/Group: Individual Therapy  Katheryn SHAUNNA Mines 11/13/2024, 7:26 AM

## 2024-11-13 NOTE — Progress Notes (Signed)
 [facility] Individual Statement of Services  Patient Name:  Wayne Molina  Date:  11/13/2024  Welcome to the [facility].  Our goal is to provide you with an individualized program based on your diagnosis and situation, designed to meet your specific needs.  With this comprehensive rehabilitation program, you will be expected to participate in at least 3 hours of rehabilitation therapies Monday-Friday, with modified therapy programming on the weekends.  Your rehabilitation program will include the following services:  Physical Therapy (PT), Occupational Therapy (OT), Speech Therapy (ST), 24 hour per day rehabilitation nursing, Therapeutic Recreaction (TR), Care Coordinator, Rehabilitation Medicine, Nutrition Services, and Pharmacy Services  Weekly team conferences will be held on Wednesday to discuss your progress.  Your Inpatient Rehabilitation Care Coordinator will talk with you frequently to get your input and to update you on team discussions.  Team conferences with you and your family in attendance may also be held.  Expected length of stay: 7 days  Overall anticipated outcome: CGA-supervision level  Depending on your progress and recovery, your program may change. Your Inpatient Rehabilitation Care Coordinator will coordinate services and will keep you informed of any changes. Your Inpatient Rehabilitation Care Coordinator's name and contact numbers are listed  below.  The following services may also be recommended but are not provided by the [facility]:   Home Health Rehabiltiation Services Outpatient Rehabilitation Services    Arrangements will be made to provide these services after discharge if needed.  Arrangements include referral to agencies that provide these services.  Your insurance has been verified to be:  [employer] and [employer] Your primary doctor is:  Mace Melena  Pertinent information will be  shared with your doctor and your insurance company.  Inpatient Rehabilitation Care Coordinator:  Rhoda Clement, KEN [phone] or ELIGAH BASQUES  Information discussed with and copy given to patient by: Clement Asberry MATSU, 11/13/2024, 1:59 PM

## 2024-11-14 ENCOUNTER — Other Ambulatory Visit: Payer: Self-pay

## 2024-11-14 DIAGNOSIS — D72829 Elevated white blood cell count, unspecified: Secondary | ICD-10-CM | POA: Diagnosis not present

## 2024-11-14 DIAGNOSIS — C719 Malignant neoplasm of brain, unspecified: Secondary | ICD-10-CM | POA: Diagnosis not present

## 2024-11-14 MED ORDER — ENSURE PLUS HIGH PROTEIN PO LIQD
237.0000 mL | Freq: Two times a day (BID) | ORAL | Status: DC
  Administered 2024-11-14 – 2024-11-16 (×4): 237 mL via ORAL

## 2024-11-14 NOTE — Progress Notes (Signed)
 Physical Therapy Session Note  Patient Details  Name: Wayne Molina MRN: [id number] Date of Birth: 1956/10/22  Today's Date: 11/14/2024 PT Individual Time: 1122-1203 PT Individual Time Calculation (min): 41 min   Short Term Goals: Week 1:  PT Short Term Goal 1 (Week 1): STG = LTG d/t ELOS  Skilled Therapeutic Interventions/Progress Updates:  Patient seated upright in w/c on entrance to room. Patient alert and agreeable to PT session. Both pt and wife related that sitting up and drinking coffee did not assist with waking.   Patient with no pain complaint at start of session.  Neuromuscular Re-ed: NMR facilitated during session with focus on alertness, standing balance, motor processing and motor control. Pt transported dependently via w/c to day room and guided in standing and playing Connect 4. Pt challenged in back/ forth piece placement. Pt's vision is questioned as he continued to demo placement of game piece in same column directly in front of him for each gamepiece.    Guided in placement of gamepiece in alternating sitting standing positions. During game, pt relates need to toilet. So pt taken dependently back to room via w/c and pt performs ambulatory transfer to toilet covering 13' x1 using RW with overall light CGA. Slow pace with decreased hip/ knee flexion bilaterally. Again holds RW at arm's length to front. Provided with RW mgmt to control pt's arms held outright. Ambulation performed again back into room for pt to return to bed. Completes ambulation using RW with continued attempt to maintain close position to walker and cued to keep hands just in front of hips.   Pt catches L side of RW on bed and with vc is able to self correct. Vision appears to be impaired especially to L side and with fatigue with eyes closed.   NMR performed for improvements in motor control and coordination, balance, sequencing, judgement, and self confidence/ efficacy in performing all aspects of mobility  at highest level of independence.   Pt returns to supine with overall supervision and gentle effort. Patient supine in bed at end of session with brakes locked, bed alarm set, and all needs within reach. Oriented pt to current time and time of lunch followed by next therapy session. Filled out pt's memory book.    Therapy Documentation Precautions:  Precautions Precautions: Fall Precaution/Restrictions Comments: L weakness, R eye ptosis Restrictions Weight Bearing Restrictions Per Provider Order: No  Pain:  No pain related this day.    Therapy/Group: Individual Therapy  Mliss DELENA Milliner PT, DPT, CSRS 11/14/2024, 12:37 PM

## 2024-11-14 NOTE — Progress Notes (Signed)
 Physical Therapy Session Note  Patient Details  Name: Wayne Molina MRN: [id number] Date of Birth: January 15, 1956  Today's Date: 11/14/2024 PT Individual Time: 0808-0850 PT Individual Time Calculation (min): 42 min   Short Term Goals: Week 1:  PT Short Term Goal 1 (Week 1): STG = LTG d/t ELOS  Skilled Therapeutic Interventions/Progress Updates:  Patient supine in  bed and asleep on entrance to room. Wife present and relates pt has been very fatigued and out of it this morning. She had difficulty waking him for breakfast. Pt requires gentle rousing and then is agreeable to PT session. Alertness varies while in bed with pt asking PT what kind of birds are you talking about?   Patient with no pain complaint at start of session. Relates not sleeping through the night as he woke several times to urinate.   Therapeutic Activity: Bed Mobility: Pt performed supine > sit with increased cues to initiate this morning. Required CGA/ MinA to initiate and then CGA to complete to upright seated position on EOB. Requires consistent vc to scoot forward to place feet on floor. But can complete with CGA for increased forward lean with effort.   MD enters room for morning rounding and pt answers MD's questions well. Wife with question re: steroid. MD relates that oncologist is in chage of steroid medication. For now pt will go home on 8mg  each day but will be up to oncologist and pt's progress if dosage will decrease.   Transfers: Pt performed sit<>stand transfers throughout session with several attempts to rise from EOB but finally with CGA and improves to supervision from w/c with armrests available. Wife present and is able to demonstrate good technique to assist pt with transfers. She still uses vc from previous stay in IPR.   Requests time to urinate to urinal. Pt able to stand at bedside and requires MinA to manage LB clothing and hold of urinal in place. Upon completion, manages LB dressing and sits to EOB.    Gait Training:  Pt ambulated 10' x1/ 6' x1 using RW with CGA provided by wife with therapist providing w/c follow.  Demonstrated slow pace and pt continues to hold walker out at arm's length. Pt also continues to relate fatigue and need to sit prior to typical distance. Pt requests coffee in attempt for increased alertness. Provided vc/ tc for more upright posture and increased step height/ length. Minimal adjustment made by pt.   Patient seated upright in w/c at end of session with brakes locked, belt alarm set, and all needs within reach. Provided with coffee on request.    Therapy Documentation Precautions:  Precautions Precautions: Fall Precaution/Restrictions Comments: L weakness, R eye ptosis Restrictions Weight Bearing Restrictions Per Provider Order: No  Pain:  No pain related this session.    Therapy/Group: Individual Therapy  Mliss DELENA Milliner PT, DPT, CSRS 11/14/2024, 10:13 AM

## 2024-11-14 NOTE — Progress Notes (Signed)
 Occupational Therapy Session Note  Patient Details  Name: Wayne Molina MRN: [id number] Date of Birth: [birth year]  Today's Date: 11/14/2024 OT Individual Time: 1401-1457 OT Individual Time Calculation (min): 56 min    Short Term Goals: Week 1:  OT Short Term Goal 1 (Week 1): STG= LTG d/t ELOS  Skilled Therapeutic Interventions/Progress Updates:     Pt received deeply sleeping in bed requiring increased time to wake and initiate participation in skilled OT session. Pt presenting to be tired d/t not sleeping well the previous night reporting 0/10 pain- OT offering intermittent rest breaks, repositioning, and therapeutic support to optimize participation in therapy session. Pt receptive to going outside for change in environment. Focused this session on activity tolerance, dynamic balance, and RW management skills. He transitioned to Eob with HOB elevated with SUP +increased time. Donned OH shirt with SUP x2 attempted d/t increased challenges orienting shirt. Donned socks/shoe with SUP +increased time and MAX verbal cues. Stand pivot EOB > wc using RW MIN A to power up to standing +verbal cues for hand placement. Transported Pt to outdoor location in wc for energy conservation. Engaged Pt in completing 3 bouts of short distance functional mobility ~15 ft with CGA-MIN A required for balance and RW management +MAX verbal cues for trunk positioning, safety, and proximity to RW. Pt with forward flexed posture and heavy reliance on B UE support on RW. Addressed Pt's dynamic standing balance deficit by having Pt stand with RW and alternate reaching with single UE towards target completing 3x6 reps with CGA-MIN A for balance. Pt fearful of letting go of RW, however demo'ing increased confidence at end of activity. Transport back to room in wc. Stand pivot wc > EOM using RW CGA +MOD verbal cues for technique. EOB > supine SUP. Pt was left resting in bed with call bell in reach, bed alarm on, and all needs met.     Therapy Documentation Precautions:  Precautions Precautions: Fall Precaution/Restrictions Comments: L weakness, R eye ptosis Restrictions Weight Bearing Restrictions Per Provider Order: No    Therapy/Group: Individual Therapy  Katheryn SHAUNNA Mines 11/14/2024, 3:01 PM

## 2024-11-14 NOTE — Progress Notes (Signed)
 "                                                        PROGRESS NOTE   Subjective/Complaints:  No issues overnite  ROS- neg fevers, chills, CP, SOB, N/V/D Objective:   No results found.  Recent Labs    11/13/24 0607  WBC 14.3*  HGB 14.1  HCT 40.8  PLT 264   Recent Labs    11/13/24 0607  NA 136  K 5.0  CL 101  CO2 27  GLUCOSE 234*  BUN 26*  CREATININE 0.83  CALCIUM  9.2    Intake/Output Summary (Last 24 hours) at 11/14/2024 0815 Last data filed at 11/14/2024 0801 Gross per 24 hour  Intake 1577 ml  Output 1250 ml  Net 327 ml        Physical Exam: Vital Signs Blood pressure (!) 151/83, pulse 80, temperature 97.8 F (36.6 C), temperature source Oral, resp. rate 18, height 5' 8 (1.727 m), weight 74.1 kg, SpO2 99%.  EENT- strabismus Left eye General: No acute distress, laying in bed appears comfortable Mood and affect are appropriate Heart: Regular rate and rhythm no rubs murmurs or extra sounds Lungs: Clear to auscultation, breathing unlabored, no rales or wheezes Abdomen: Positive bowel sounds, soft nontender to palpation, nondistended Extremities: No clubbing, cyanosis, or edema Skin: Warm and dry Neurologic: Cranial nerves II through XII intact, motor strength is 5/5 in bilateral deltoid, bicep, tricep, grip, hip flexor, knee extensors, ankle dorsiflexor and plantar flexor Sitting balance fair, leans backward at EOB , needs cuing  Musculoskeletal: Full range of motion in all 4 extremities. No joint swelling  Prior neuro assessment is c/w today's exam 11/14/2024.   Assessment/Plan: 1. Functional deficits which require 3+ hours per day of interdisciplinary therapy in a comprehensive inpatient rehab setting. Physiatrist is providing close team supervision and 24 hour management of active medical problems listed below. Physiatrist and rehab team continue to assess barriers to discharge/monitor patient progress toward functional and medical goals  Care  Tool:  Bathing    Body parts bathed by patient: Right arm, Chest, Left arm, Abdomen, Front perineal area, Face, Left lower leg, Right lower leg, Buttocks, Right upper leg, Left upper leg   Body parts bathed by helper: Buttocks, Left lower leg, Right lower leg, Front perineal area, Right arm, Left arm     Bathing assist Assist Level: Maximal Assistance - Patient 24 - 49%     Upper Body Dressing/Undressing Upper body dressing   What is the patient wearing?: Pull over shirt    Upper body assist Assist Level: Minimal Assistance - Patient > 75%    Lower Body Dressing/Undressing Lower body dressing      What is the patient wearing?: Underwear/pull up, Pants     Lower body assist Assist for lower body dressing: Maximal Assistance - Patient 25 - 49%     Toileting Toileting    Toileting assist Assist for toileting: Moderate Assistance - Patient 50 - 74%     Transfers Chair/bed transfer  Transfers assist     Chair/bed transfer assist level: Minimal Assistance - Patient > 75%     Locomotion Ambulation   Ambulation assist      Assist level: Minimal Assistance - Patient > 75% Assistive device: Walker-rolling Max distance: 30 ft   Walk  10 feet activity   Assist     Assist level: Minimal Assistance - Patient > 75% Assistive device: Walker-rolling   Walk 50 feet activity   Assist Walk 50 feet with 2 turns activity did not occur: Safety/medical concerns         Walk 150 feet activity   Assist Walk 150 feet activity did not occur: Safety/medical concerns         Walk 10 feet on uneven surface  activity   Assist Walk 10 feet on uneven surfaces activity did not occur: Safety/medical concerns         Wheelchair     Assist Is the patient using a wheelchair?: Yes Type of Wheelchair: Manual    Wheelchair assist level: Supervision/Verbal cueing Max wheelchair distance: 50 ft    Wheelchair 50 feet with 2 turns activity    Assist         Assist Level: Minimal Assistance - Patient > 75%   Wheelchair 150 feet activity     Assist      Assist Level: Maximal Assistance - Patient 25 - 49%   Blood pressure (!) 151/83, pulse 80, temperature 97.8 F (36.6 C), temperature source Oral, resp. rate 18, height 5' 8 (1.727 m), weight 74.1 kg, SpO2 99%.  Medical Problem List and Plan: 1. Functional deficits secondary to glioblastoma with vasogenic edema suspect symptoms due to postradiation inflammation with history of right craniotomy 08/06/2024.  Chronic Decadron  resumed.  Plan to address chemotherapy as outpatient per medical oncology Dr. [name]             -patient may shower             -ELOS/Goals: PT/OT/SLP supervision, ELOS 7d             - Okay to continue CIR 2.  Antithrombotics: -DVT/anticoagulation:  Pharmaceutical: Eliquis              -antiplatelet therapy: N/A 3. Pain Management: Oxycodone  as needed 4. Mood/Behavior/Sleep: Melatonin 3 mg nightly.  Provide emotional support             -antipsychotic agents: Seroquel  25 mg nightly 5. Neuropsych/cognition: This patient is not capable of making decisions on his own behalf. 6. Skin/Wound Care: Routine skin checks- Right arm IV site looks ok but no longer is using will d/c 7. Fluids/Electrolytes/Nutrition: Routine N and outs with follow-up chemistries. 8.  Seizure disorder.  Keppra  1000 mg twice daily 9.  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  Continue Eliquis .  Cardiac rate controlled.  Follow-up cardiology services. Daily weight.  Filed Weights   11/12/24 0500 11/13/24 0500 11/14/24 0500  Weight: 74.6 kg 74.6 kg 74.1 kg  HR stable  10.  Hypertension.  Coreg  12.5 mg twice daily.  Monitor with increased mobility Vitals:   11/13/24 2045 11/14/24 0428  BP: 121/82 (!) 151/83  Pulse: 80 80  Resp: 18 18  Temp: (!) 97.5 F (36.4 C) 97.8 F (36.6 C)  SpO2: 98% 99%  1/5 BP fair control,  continue current regimen and monitor 11.  Hyperlipidemia.  Lipitor 12.  Constipation.  MiraLAX  daily.  - LBM 1/5, continue current regimen 13.  BPH/history of hematuria.  Check PVR.  Patient on Flomax . U/A 11/09/24 did not indicate infection  prior to admission but only taking it as needed 1/5 improved, denies recent hematuria, continent 14.  Osteoporosis.  Os-Cal 1250 mg twice daily 15.  Cough. Check CXR- no acute findings  16.  Leukocytosis without fever       Latest Ref Rng & Units 11/13/2024    6:07 AM 11/10/2024    2:20 AM 11/04/2024    1:34 AM  CBC  WBC 4.0 - 10.5 K/uL 14.3  11.7  4.3   Hemoglobin 13.0 - 17.0 g/dL 85.8  85.6  86.3   Hematocrit 39.0 - 52.0 % 40.8  42.3  39.6   Platelets 150 - 400 K/uL 264  305  213    On decadron  4mg  BID which is likely cause   LOS: 4 days A FACE TO FACE EVALUATION WAS PERFORMED  Wayne Molina 11/14/2024, 8:15 AM     "

## 2024-11-14 NOTE — Progress Notes (Signed)
 Speech Language Pathology Daily Session Note  Patient Details  Name: Wayne Molina MRN: [id number] Date of Birth: 03/09/1956  Today's Date: 11/14/2024 SLP Individual Time: 9059-8961 SLP Individual Time Calculation (min): 58 min  Short Term Goals: Week 1: SLP Short Term Goal 1 (Week 1): STG = LTG due to ELOS  Skilled Therapeutic Interventions: Skilled therapy session focused on cognitive goals. Patient extremely lethargic this date, requiring consistent cues to stay alert and participate in session. Patient oriented to self, situation and location independently, however required consistent max-total A to orient to time despite use of errorless learning and external aids. SLP targeted problem solving and attention goals through sequencing of months of the year and recalling holidays/traditions. Patient required maxA throughout to recall months and their matching holidays. Patient left in [facility] with wife present and memory book completed. Continue POC  Pain None reported   Therapy/Group: Individual Therapy  Keshawna Dix M.A., CCC-SLP 11/14/2024, 7:43 AM

## 2024-11-15 ENCOUNTER — Inpatient Hospital Stay (HOSPITAL_COMMUNITY)

## 2024-11-15 ENCOUNTER — Other Ambulatory Visit: Payer: Self-pay

## 2024-11-15 DIAGNOSIS — D72829 Elevated white blood cell count, unspecified: Secondary | ICD-10-CM | POA: Diagnosis not present

## 2024-11-15 DIAGNOSIS — C719 Malignant neoplasm of brain, unspecified: Secondary | ICD-10-CM | POA: Diagnosis not present

## 2024-11-15 LAB — LEVETIRACETAM LEVEL: Levetiracetam Lvl: 29 ug/mL (ref 10.0–40.0)

## 2024-11-15 MED ORDER — NYSTATIN 100000 UNIT/GM EX POWD: Freq: Two times a day (BID) | CUTANEOUS | Status: DC

## 2024-11-15 NOTE — Progress Notes (Signed)
 "                                                        PROGRESS NOTE   Subjective/Complaints:  No issues overnite except per wife pt clearing his throat a lot  ROS- neg fevers, chills, CP, SOB, N/V/D Objective:   No results found.  Recent Labs    11/13/24 0607  WBC 14.3*  HGB 14.1  HCT 40.8  PLT 264   Recent Labs    11/13/24 0607  NA 136  K 5.0  CL 101  CO2 27  GLUCOSE 234*  BUN 26*  CREATININE 0.83  CALCIUM  9.2    Intake/Output Summary (Last 24 hours) at 11/15/2024 0828 Last data filed at 11/15/2024 0757 Gross per 24 hour  Intake 1200 ml  Output 1450 ml  Net -250 ml        Physical Exam: Vital Signs Blood pressure (!) 134/96, pulse 69, temperature 98.1 F (36.7 C), temperature source Oral, resp. rate 17, height 5' 8 (1.727 m), weight 73.2 kg, SpO2 94%.  EENT- strabismus Left eye General: No acute distress, laying in bed appears comfortable Mood and affect are appropriate Heart: Regular rate and rhythm no rubs murmurs or extra sounds Lungs: Clear to auscultation, breathing unlabored, no rales or wheezes Abdomen: Positive bowel sounds, soft nontender to palpation, nondistended Extremities: No clubbing, cyanosis, or edema Skin: Warm and dry Neurologic: Cranial nerves II through XII intact, motor strength is 5/5 in bilateral deltoid, bicep, tricep, grip, hip flexor, knee extensors, ankle dorsiflexor and plantar flexor Sitting balance fair, leans backward at EOB , needs cuing  Musculoskeletal: Full range of motion in all 4 extremities. No joint swelling  Prior neuro assessment is c/w today's exam 11/15/2024.   Assessment/Plan: 1. Functional deficits which require 3+ hours per day of interdisciplinary therapy in a comprehensive inpatient rehab setting. Physiatrist is providing close team supervision and 24 hour management of active medical problems listed below. Physiatrist and rehab team continue to assess barriers to discharge/monitor patient progress toward  functional and medical goals  Care Tool:  Bathing    Body parts bathed by patient: Right arm, Chest, Left arm, Abdomen, Front perineal area, Face, Left lower leg, Right lower leg, Buttocks, Right upper leg, Left upper leg   Body parts bathed by helper: Buttocks, Left lower leg, Right lower leg, Front perineal area, Right arm, Left arm     Bathing assist Assist Level: Maximal Assistance - Patient 24 - 49%     Upper Body Dressing/Undressing Upper body dressing   What is the patient wearing?: Pull over shirt    Upper body assist Assist Level: Minimal Assistance - Patient > 75%    Lower Body Dressing/Undressing Lower body dressing      What is the patient wearing?: Underwear/pull up, Pants     Lower body assist Assist for lower body dressing: Maximal Assistance - Patient 25 - 49%     Toileting Toileting    Toileting assist Assist for toileting: Moderate Assistance - Patient 50 - 74%     Transfers Chair/bed transfer  Transfers assist     Chair/bed transfer assist level: Minimal Assistance - Patient > 75%     Locomotion Ambulation   Ambulation assist      Assist level: Minimal Assistance - Patient > 75% Assistive  device: Walker-rolling Max distance: 30 ft   Walk 10 feet activity   Assist     Assist level: Minimal Assistance - Patient > 75% Assistive device: Walker-rolling   Walk 50 feet activity   Assist Walk 50 feet with 2 turns activity did not occur: Safety/medical concerns         Walk 150 feet activity   Assist Walk 150 feet activity did not occur: Safety/medical concerns         Walk 10 feet on uneven surface  activity   Assist Walk 10 feet on uneven surfaces activity did not occur: Safety/medical concerns         Wheelchair     Assist Is the patient using a wheelchair?: Yes Type of Wheelchair: Manual    Wheelchair assist level: Supervision/Verbal cueing Max wheelchair distance: 50 ft    Wheelchair 50 feet with  2 turns activity    Assist        Assist Level: Minimal Assistance - Patient > 75%   Wheelchair 150 feet activity     Assist      Assist Level: Maximal Assistance - Patient 25 - 49%   Blood pressure (!) 134/96, pulse 69, temperature 98.1 F (36.7 C), temperature source Oral, resp. rate 17, height 5' 8 (1.727 m), weight 73.2 kg, SpO2 94%.  Medical Problem List and Plan: 1. Functional deficits secondary to glioblastoma with vasogenic edema suspect symptoms due to postradiation inflammation with history of right craniotomy 08/06/2024.  Chronic Decadron  resumed.  Plan to address chemotherapy as outpatient per medical oncology Dr. [name]             -patient may shower             -ELOS/Goals: PT/OT/SLP supervision, ELOS 7d             - Okay to continue CIR Fatigue - check sleep chart, keppra  level , no new sedating meds Per Dr [name] will reduce decadron  to 4mg  once pt d/ced from CIR  Reduced mental acuity and increased fatigue, await keppra  levels, check CT head for increased cerebral edema (but no new focal neuro signs)  2.  Antithrombotics: -DVT/anticoagulation:  Pharmaceutical: Eliquis              -antiplatelet therapy: N/A 3. Pain Management: Oxycodone  as needed 4. Mood/Behavior/Sleep: Melatonin 3 mg nightly.  Provide emotional support             -antipsychotic agents: Seroquel  25 mg nightly home med 5. Neuropsych/cognition: This patient is not capable of making decisions on his own behalf. 6. Skin/Wound Care: Routine skin checks- Right arm IV site looks ok but no longer is using will d/c 7. Fluids/Electrolytes/Nutrition: Routine N and outs with follow-up chemistries. 8.  Seizure disorder.  Keppra  1000 mg twice daily check levetiracetam  level  9.  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  Continue Eliquis .  Cardiac rate controlled.  Follow-up cardiology services. Daily weight.  Filed Weights   11/14/24  0500 11/15/24 0500 11/15/24 0525  Weight: 74.1 kg 73.9 kg 73.2 kg  HR stable  10.  Hypertension.  Coreg  12.5 mg twice daily.  Monitor with increased mobility Vitals:   11/14/24 1939 11/15/24 0525  BP: 135/87 (!) 134/96  Pulse: 85 69  Resp: 18 17  Temp: 97.8 F (36.6 C) 98.1 F (36.7 C)  SpO2: 95% 94%  1/7 BP fair control, continue current regimen and monitor 11.  Hyperlipidemia.  Lipitor 12.  Constipation.  MiraLAX  daily.  - LBM 1/5, continue current regimen 13.  BPH/history of hematuria.  Check PVR.  Patient on Flomax . U/A 11/09/24 did not indicate infection  prior to admission but only taking it as needed 1/5 improved, denies recent hematuria, continent 14.  Osteoporosis.  Os-Cal 1250 mg twice daily 15.  Cough. Check CXR- no acute findings  16.  Leukocytosis without fever       Latest Ref Rng & Units 11/13/2024    6:07 AM 11/10/2024    2:20 AM 11/04/2024    1:34 AM  CBC  WBC 4.0 - 10.5 K/uL 14.3  11.7  4.3   Hemoglobin 13.0 - 17.0 g/dL 85.8  85.6  86.3   Hematocrit 39.0 - 52.0 % 40.8  42.3  39.6   Platelets 150 - 400 K/uL 264  305  213    On decadron  4mg  BID which is likely cause   LOS: 5 days A FACE TO FACE EVALUATION WAS PERFORMED  Prentice FORBES Compton 11/15/2024, 8:28 AM     "

## 2024-11-15 NOTE — Progress Notes (Signed)
 Physical Therapy Note  Patient Details  Name: Wayne Molina MRN: [id number] Date of Birth: June 01, 1956 Today's Date: 11/15/2024    Pt politely deferring therapy treatment, requesting to rest. Pt missed 30 minutes of treatment.    [name] 11/15/2024, 4:37 PM

## 2024-11-15 NOTE — Progress Notes (Signed)
 Speech Language Pathology Daily Session Note  Patient Details  Name: Wayne Molina MRN: [id number] Date of Birth: 1956/03/04  Today's Date: 11/15/2024 SLP Individual Time: 9154-9072 SLP Individual Time Calculation (min): 42 min  Short Term Goals: Week 1: SLP Short Term Goal 1 (Week 1): STG = LTG due to ELOS  Skilled Therapeutic Interventions: SLP conducted skilled therapy session targeting swallowing and cognitive goals. Patient completed EMST using EMST75 for 5 sets of 5 at 10cmH2O with supervision cues to take rest breaks. During session, RN administered medications whole with water , one at a time. No overt s/sx of penetration/aspiration noted, though bedside indicators are likely unreliable due to hx of silent aspiration. Patient benefited from min cues to orient to date and SLP provided calendar visual aid to promote ongoing orientation throughout the day. Patient endorses specific difficulty with time and money-based calculations, thus conducted verbally presented time-based calculations where patient required max assist for working memory and problem solving. During visual scanning task, patient interpreted map directions task with supervision to min assist. In final minutes of session, patient required mod to max assist to draw times on provided clocks. Patient was left in room with call bell in reach and alarm set. SLP will continue to target goals per plan of care.        Pain  none  Therapy/Group: Individual Therapy  Rosina Downy, M.A., CCC-SLP  Minta Fair A [place] 11/15/2024, 9:30 AM

## 2024-11-15 NOTE — Progress Notes (Signed)
 Patient ID: Wayne Molina, male   DOB: Mar 25, 1956, 69 y.o.   MRN: [id number]  Met with pt and wife who is present to give team conference update toward his goals of supervision-CGA level. Pt is doing much better today in therapies not as tired and lethargic. His main issue is his fatigue and deconditioning. Both want him to get home health via [facility] again since had good experience with them. Will reach out to them and see if can accept the referral. Has all needed equipment from last time. Target discharge date of 1/9. Both pleased with this and feel he will be ready to go home.

## 2024-11-15 NOTE — Progress Notes (Signed)
 Physical Therapy Session Note  Patient Details  Name: Wayne Molina MRN: [id number] Date of Birth: [birth year]  Today's Date: 11/15/2024 PT Individual Time: 1101-1200 PT Individual Time Calculation (min): 59 min   Short Term Goals: Week 1:  PT Short Term Goal 1 (Week 1): STG = LTG d/t ELOS   Skilled Therapeutic Interventions/Progress Updates:  Patient supine in bed on entrance to room. Wife present. Patient alert and agreeable to PT session.   Patient with no pain complaint at start of session.  Therapeutic Activity: Bed Mobility: Pt instructed to come to sit on EOB to his L side. Pt continuously moves to R side of bed and requires MaxA to move bed linens for ease of exiting bed to L side. He performed supine > sit to L side with SBA but requiring extra time and effort to complete. Transfers: Pt performed sit<>stand and stand pivot transfers throughout session with cues for positioning in w/c, BUE placement. Completes all sit<>stand transfers with SBA/ CGA which is a significant improvement compared to yesterday. Provided vc/ tc for splint hand positioning, more posterior foot positioning and increasing forward lean over feet. Stand pivot transfers performed this session with pt discarding Rw to side in order to grasp w/c armrests instead. Cued for increased safety awareness.   Wheelchair Mobility:  Pt propelled wheelchair 100 feet with BUE only and supervision. Provided vc/ tc for maintaining straight path and improved quality of mobility.  Gait Training:  Pt guided in stair training with physical demonstration and verbal instructions provided prior to performance. Pt is able to complete four 6 steps using BHR with heavy CGA to ascend leading with RLE and then CGA to descend leading with LLE. VC provided at initiation of each direction for leading LE.   Next guided pt in curb step training using RW. Pt requires heavy CGA for rising up to step with significant vc for sequencing walker mgmt  and personal proximity to walker for increased safety. Wife will require training in next session for provision of safe assist in curb step. Both have expressed desire for community integration and visiting restaurants again in the future.   Pt positioned in hallway and cued for straight path ambulation back to room which is 105 feet away. Provided with visual goal. Pt ambulated 70 ft using RW with CGA and close w/c follow for fatigue. Demonstrated flexed posture with continued hold of walker with outstretched arms. He does self correct with vc and then cued for understanding of hand positioning in relation to body. Then cued to maitnain hand positioning just ahead of hips.  Patient seated upright in w/c at end of session with brakes locked, belt alarm set, and all needs within reach. CSW in room with pt and wife.    Therapy Documentation Precautions:  Precautions Precautions: Fall Precaution/Restrictions Comments: L weakness, R eye ptosis Restrictions Weight Bearing Restrictions Per Provider Order: No  Pain:  No pain related this session. Improved in fatigue level this session.  Therapy/Group: Individual Therapy  Mliss DELENA Milliner PT, DPT, CSRS 11/15/2024, 1:28 PM

## 2024-11-15 NOTE — Patient Care Conference (Signed)
 Inpatient RehabilitationTeam Conference and Plan of Care Update Date: 11/15/2024   Time: 10:39 AM    Patient Name: Wayne Molina      Medical Record Number: [id number]  Date of Birth: 1956/05/20 Sex: Male         Room/Bed: 4M09C/4M09C-01 Payor Info: Payor: [employer] / Plan: [employer] - [place] / Product Type: *No Product type* /    Admit Date/Time:  11/10/2024  4:55 PM  Primary Diagnosis:  GBM (glioblastoma multiforme) [facility])  Hospital Problems: Principal Problem:   GBM (glioblastoma multiforme) (HCC) Active Problems:   Cough   Benign prostatic hyperplasia with urinary frequency   Constipation    Expected Discharge Date: Expected Discharge Date: 11/17/24  Team Members Present: Physician leading conference: Dr. Prentice Compton Social Worker Present: Rhoda Clement, LCSW Nurse Present: Barnie Ronde, RN PT Present: Recardo Milliner, PT OT Present: Katheryn Mines, OT SLP Present: Recardo Cowing, SLP     Current Status/Progress Goal Weekly Team Focus  Bowel/Bladder   Patient is continent of bowel and bladder. LBM is 1/5.   Patient will remain continent of bowel and bladder.   Monitor bowel and bladder patterns to prevent constipation, diarrhea or urinary issues.    Swallow/Nutrition/ Hydration   reg/thin   mod i  use of strategies, reduced s/sx of aspiration, review need for MBS    ADL's   UB ADLs SUP, LB ADLs SBA-CGA, toilet/shower transfers SBA/CGA;Progressing, however limited by fatigue d/t not sleeping well, slowed initation, decreased attention   SUP transfers, CGA bathing, SUP bath/dressing   PT/family education, dynamic balance, safety awareness, RW managment, ADL retraining    Mobility   Bed mobility = SBA/ CGA, Standing Transfers = SBA/ CGA, ambulation = up to 30 ft per bout using RW   overall supervision  Barriers: vision field cut, hemibody weakness, fatigue /// Work on: standing balance, upright posture, activity tolerance, general strength, family education     Communication   reduced vocal intensity impacting intelligibility   supervision   EMST, SLOP    Safety/Cognition/ Behavioral Observations  modA   minA   orientation, problem solving, memory, emergent awareness    Pain   Patient denies pain tonight. Patient usually asks for PRN Oxycodone  at night but held it off tonight because he thinks that this medication made him very groggy at therapy on 1/6, which patient stated he never experienced before.   Patient will continue to report no pain.   Assess pain every shift and as needed and provide appropriate ordered pain management interventions.    Skin   Patient has redness to sacral area. Pt has scheduled nystatin  topical powder.   Patient's skin will remain intact and no further breakdown, and existing redness to the sacral area will improve or resolve prior to discharge.  Ongoing skin assessment every shift and as needed and carry out wound care as ordered to promote healing and prevent further breakdown.      Discharge Planning:  Home with wife who has been providing care prior to admission due to pt's illness. Hoping he will get stronger here and mobile like he was PTA. Wife stays here with him to provide support and participate in his care   Team Discussion: Patient admitted with debility and vasogenic edema post crani/radiation for gleoblastoma on decadron . Progress limited by fatigue and insomnia.  Patient on target to meet rehab goals: yes, currently needs supervision for upper body care (cues for orientation of shirt), supervision - CGA for lower body care  and CGA for toileting.  Patient is not safe using a RW/ leans and left visual field cut affects safety.  Needs mod assist for cognition; working on problem solving, memory and awareness with increased vocal intensity. Goals for discharge set for supervision overall.  *See Care Plan and progress notes for long and short-term goals.   Revisions to Treatment Plan:  N/a    Teaching Needs: Safety, medications, transfers, toileting, etc.   Current Barriers to Discharge: Decreased caregiver support  Possible Resolutions to Barriers: Family education HH follow up services     Medical Summary Current Status: Increased lethargy, URI symptoms, leukocytosis     Possible Resolutions to [employer] Focus: recheck CT head, check Keppra  levels , monitor neuro status, cont decadron    Continued Need for Acute Rehabilitation Level of Care: The patient requires daily medical management by a physician with specialized training in physical medicine and rehabilitation for the following reasons: Direction of a multidisciplinary physical rehabilitation program to maximize functional independence : Yes Medical management of patient stability for increased activity during participation in an intensive rehabilitation regime.: Yes Analysis of laboratory values and/or radiology reports with any subsequent need for medication adjustment and/or medical intervention. : Yes   I attest that I was present, lead the team conference, and concur with the assessment and plan of the team.   Fredericka Sober B 11/15/2024, 3:57 PM

## 2024-11-15 NOTE — Progress Notes (Signed)
 Occupational Therapy Session Note  Patient Details  Name: Wayne Molina MRN: [id number] Date of Birth: September 12, 1956  Today's Date: 11/15/2024 OT Individual Time: 8693-8585 OT Individual Time Calculation (min): 68 min    Short Term Goals: Week 1:  OT Short Term Goal 1 (Week 1): STG= LTG d/t ELOS  Skilled Therapeutic Interventions/Progress Updates:     Pt received resting in bed with wife present in room. Pt presenting to be tired from not sleeping well, however in good spirits receptive to skilled OT session reporting 0/10 pain- OT offering intermittent rest breaks, repositioning, and therapeutic support to optimize participation in therapy session. Focused this session on functional mobility training, RW management skills, and HEP education. He transitioned to EOB using bed rail MOD I +increased time and mod verbal cues for motivation and initiation. Donned shoes EOB set-up. Pt completed functional mobility EOB > wc using RW with CGA +verbal cues required to recall hand placement and RW positioning when turning to sit in wc- forward flexed posture and decreased step height. Provided time for visit with therapy dog to support moral with Pt participatory and happy to pet dog. Transported Pt to therapy gym in wc for energy conservation. Tied therband around front portion of gait belt and RW to provide external tactile and visual cue to improve proximity to RW during functional mobility. Engaged Pt in completing short bouts of functional mobility ~27ft to simulate ambulating household distance with external visual cue of therband with noted improvement- he completed 4 trials with SBA to CGA provided for safety and MIN verbal cues for trunk alignment. MAX verbal cues required for RW positioning when turning to sit in wc, hand placement, and safety. Remainder of the session focused on HEP education. Issued Pt a red (moderate) therband and HEP handout of the following exercises- Pt completed to support learning  and carryover with Ot providing:  Exercises - Seated Shoulder Horizontal Abduction with Resistance  - 1 x daily - 7 x weekly - 2 sets - 10 reps - Seated Shoulder Row with Resistance Anchored at Feet  - 1 x daily - 7 x weekly - 2 sets - 10 reps - Seated Elbow Flexion with Resistance Under Foot  - 1 x daily - 7 x weekly - 3 sets - 10 reps - Seated Elbow Extension with Self-Anchored Resistance  - 1 x daily - 7 x weekly - 3 sets - 10 reps - Seated Shoulder Diagonal Pulls with Resistance  - 1 x daily - 7 x weekly - 3 sets - 10 reps - Seated Shoulder Flexion with Self-Anchored Resistance  - 1 x daily - 7 x weekly - 3 sets - 10 reps  Transported Pt back to room total A in wc for energy conservation. Pt requesting to return to bed. Stand pivot wc > EOB using RW CGA. Pt was left resting in bed with call bell in reach, bed alarm on, and all needs met.    Therapy Documentation Precautions:  Precautions Precautions: Fall Precaution/Restrictions Comments: L weakness, R eye ptosis Restrictions Weight Bearing Restrictions Per Provider Order: No   Therapy/Group: Individual Therapy  Katheryn SHAUNNA [place]ines 11/15/2024, 7:19 AM

## 2024-11-16 ENCOUNTER — Other Ambulatory Visit (HOSPITAL_COMMUNITY): Payer: Self-pay

## 2024-11-16 ENCOUNTER — Encounter: Payer: Self-pay | Admitting: Internal Medicine

## 2024-11-16 DIAGNOSIS — D72829 Elevated white blood cell count, unspecified: Secondary | ICD-10-CM | POA: Diagnosis not present

## 2024-11-16 DIAGNOSIS — C719 Malignant neoplasm of brain, unspecified: Secondary | ICD-10-CM | POA: Diagnosis not present

## 2024-11-16 MED ORDER — ATORVASTATIN CALCIUM 40 MG PO TABS
40.0000 mg | ORAL_TABLET | Freq: Every day | ORAL | 0 refills | Status: DC
  Filled 2024-11-16: qty 30, 30d supply, fill #0

## 2024-11-16 MED ORDER — APIXABAN 5 MG PO TABS
5.0000 mg | ORAL_TABLET | Freq: Two times a day (BID) | ORAL | 0 refills | Status: DC
  Filled 2024-11-16 – 2024-12-08 (×4): qty 60, 30d supply, fill #0

## 2024-11-16 MED ORDER — PANTOPRAZOLE SODIUM 40 MG PO TBEC
40.0000 mg | DELAYED_RELEASE_TABLET | Freq: Every day | ORAL | 0 refills | Status: DC
  Filled 2024-11-16: qty 30, 30d supply, fill #0

## 2024-11-16 MED ORDER — TAMSULOSIN HCL 0.4 MG PO CAPS
0.4000 mg | ORAL_CAPSULE | Freq: Every day | ORAL | 0 refills | Status: DC
  Filled 2024-11-16: qty 30, 30d supply, fill #0

## 2024-11-16 MED ORDER — MELATONIN 3 MG PO TABS
3.0000 mg | ORAL_TABLET | Freq: Every day | ORAL | 0 refills | Status: DC
  Filled 2024-11-16: qty 30, 30d supply, fill #0

## 2024-11-16 MED ORDER — DEXAMETHASONE 4 MG PO TABS
4.0000 mg | ORAL_TABLET | Freq: Two times a day (BID) | ORAL | 0 refills | Status: DC
  Filled 2024-11-16: qty 60, 30d supply, fill #0

## 2024-11-16 MED ORDER — LEVETIRACETAM 1000 MG PO TABS
1000.0000 mg | ORAL_TABLET | Freq: Two times a day (BID) | ORAL | 0 refills | Status: DC
  Filled 2024-11-16 – 2024-11-30 (×3): qty 60, 30d supply, fill #0

## 2024-11-16 MED ORDER — QUETIAPINE FUMARATE 25 MG PO TABS
25.0000 mg | ORAL_TABLET | Freq: Every day | ORAL | 0 refills | Status: DC
  Filled 2024-11-16: qty 30, 30d supply, fill #0

## 2024-11-16 MED ORDER — MAGNESIUM OXIDE -MG SUPPLEMENT 400 (240 MG) MG PO TABS
200.0000 mg | ORAL_TABLET | Freq: Every day | ORAL | 0 refills | Status: DC
  Filled 2024-11-16: qty 30, 60d supply, fill #0

## 2024-11-16 MED ORDER — GABAPENTIN 100 MG PO CAPS
100.0000 mg | ORAL_CAPSULE | Freq: Three times a day (TID) | ORAL | 0 refills | Status: DC
  Filled 2024-11-16: qty 90, 30d supply, fill #0

## 2024-11-16 MED ORDER — CARVEDILOL 12.5 MG PO TABS
12.5000 mg | ORAL_TABLET | Freq: Two times a day (BID) | ORAL | 0 refills | Status: DC
  Filled 2024-11-16 – 2024-11-30 (×3): qty 60, 30d supply, fill #0

## 2024-11-16 NOTE — Progress Notes (Signed)
 "                                                        PROGRESS NOTE   Subjective/Complaints:  Reviewed images from CT head , discussed results with pt and wife Also discussed d/c date  ROS- neg fevers, chills, CP, SOB, N/V/D Objective:   CT HEAD WO CONTRAST ( ) Result Date: 11/15/2024 EXAM: CT HEAD WITHOUT CONTRAST 11/15/2024 03:09:07 PM TECHNIQUE: CT of the head was performed without the administration of intravenous contrast. Automated exposure control, iterative reconstruction, and/or weight based adjustment of the mA/kV was utilized to reduce the radiation dose to as low as reasonably achievable. COMPARISON: Head CT and MRI 11/03/2024. CLINICAL HISTORY: Glioblastoma; decreased mental acuity assess for increased edema. FINDINGS: BRAIN AND VENTRICLES: A heterogeneous, predominantly hypodense mass is again seen in the right temporoparietal region and involving the splenium of the corpus callosum consistent with the known glioblastoma. Small foci of calcification are present in this region, likely along with some nonacute blood products. The mass overall appears mildly smaller than on the prior CT. There is only at most very mild surrounding edema which has greatly decreased from prior. There is persistent mild mass effect on the right lateral ventricle. No acute intracranial hemorrhage, acute large territory infarct, midline shift, or extra-axial fluid collection is identified. There is no interval ventricular dilatation. A biopsy track is again noted in the posterior right temporal lobe. Calcified atherosclerosis at the skull base. ORBITS: No acute abnormality. SINUSES: No acute abnormality. SOFT TISSUES AND SKULL: Right temporoparietal burr hole. No acute soft tissue abnormality. No skull fracture. IMPRESSION: 1. 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 No definite progressive or acute findings. Electronically signed by: Dasie Hamburg MD MD 11/15/2024 03:28 PM EST RP  Workstation: [id number]    No results for input(s): WBC, HGB, HCT, PLT in the last 72 hours.  No results for input(s): NA, K, CL, CO2, GLUCOSE, BUN, CREATININE, CALCIUM  in the last 72 hours.   Intake/Output Summary (Last 24 hours) at 11/16/2024 0747 Last data filed at 11/16/2024 0609 Gross per 24 hour  Intake 1130 ml  Output 1300 ml  Net -170 ml        Physical Exam: Vital Signs Blood pressure (!) 143/82, pulse 88, temperature 97.7 F (36.5 C), resp. rate 18, height 5' 8 (1.727 m), weight 73.2 kg, SpO2 94%.  EENT- strabismus Left eye General: No acute distress, laying in bed appears comfortable Mood and affect are appropriate Heart: Regular rate and rhythm no rubs murmurs or extra sounds Lungs: Clear to auscultation, breathing unlabored, no rales or wheezes Abdomen: Positive bowel sounds, soft nontender to palpation, nondistended Extremities: No clubbing, cyanosis, or edema Skin: Warm and dry Neurologic: Cranial nerves II through XII intact, motor strength is 5/5 in bilateral deltoid, bicep, tricep, grip, hip flexor, knee extensors, ankle dorsiflexor and plantar flexor Sitting balance fair, leans backward at EOB , needs cuing  Musculoskeletal: Full range of motion in all 4 extremities. No joint swelling  Prior neuro assessment is c/w today's exam 11/16/2024.   Assessment/Plan: 1. Functional deficits which require 3+ hours per day of interdisciplinary therapy in a comprehensive inpatient rehab setting. Physiatrist is providing close team supervision and 24 hour management of active medical problems listed below. Physiatrist and rehab team continue to assess barriers  to discharge/monitor patient progress toward functional and medical goals  Care Tool:  Bathing    Body parts bathed by patient: Right arm, Chest, Left arm, Abdomen, Front perineal area, Face, Left lower leg, Right lower leg, Buttocks, Right upper leg, Left upper leg   Body parts bathed by  helper: Buttocks, Left lower leg, Right lower leg, Front perineal area, Right arm, Left arm     Bathing assist Assist Level: Maximal Assistance - Patient 24 - 49%     Upper Body Dressing/Undressing Upper body dressing   What is the patient wearing?: Pull over shirt    Upper body assist Assist Level: Minimal Assistance - Patient > 75%    Lower Body Dressing/Undressing Lower body dressing      What is the patient wearing?: Underwear/pull up, Pants     Lower body assist Assist for lower body dressing: Maximal Assistance - Patient 25 - 49%     Toileting Toileting    Toileting assist Assist for toileting: Moderate Assistance - Patient 50 - 74%     Transfers Chair/bed transfer  Transfers assist     Chair/bed transfer assist level: Set up assist     Locomotion Ambulation   Ambulation assist      Assist level: Contact Guard/Touching assist Assistive device: Walker-rolling Max distance: 70 ft   Walk 10 feet activity   Assist     Assist level: Supervision/Verbal cueing Assistive device: Walker-rolling   Walk 50 feet activity   Assist Walk 50 feet with 2 turns activity did not occur: Safety/medical concerns  Assist level: Contact Guard/Touching assist Assistive device: Walker-rolling    Walk 150 feet activity   Assist Walk 150 feet activity did not occur: Safety/medical concerns         Walk 10 feet on uneven surface  activity   Assist Walk 10 feet on uneven surfaces activity did not occur: Safety/medical concerns         Wheelchair     Assist Is the patient using a wheelchair?: Yes Type of Wheelchair: Manual    Wheelchair assist level: Supervision/Verbal cueing Max wheelchair distance: 124ft    Wheelchair 50 feet with 2 turns activity    Assist        Assist Level: Contact Guard/Touching assist   Wheelchair 150 feet activity     Assist      Assist Level: Minimal Assistance - Patient > 75%   Blood pressure  (!) 143/82, pulse 88, temperature 97.7 F (36.5 C), resp. rate 18, height 5' 8 (1.727 m), weight 73.2 kg, SpO2 94%.  Medical Problem List and Plan: 1. Functional deficits secondary to glioblastoma with vasogenic edema suspect symptoms due to postradiation inflammation with history of right craniotomy 08/06/2024.  Chronic Decadron  resumed.  Plan to address chemotherapy as outpatient per medical oncology Dr. [name]             -patient may shower             -ELOS/Goals: PT/OT/SLP supervision, 11/17/24 d/c date             - Okay to continue CIR Fatigue - check sleep chart, keppra  level , no new sedating meds Per Dr [name] will reduce decadron  to 4mg  once pt d/ced from CIR  Reduced mental acuity and increased fatigue, await keppra  levels, check CT head for increased cerebral edema (but no new focal neuro signs)  2.  Antithrombotics: -DVT/anticoagulation:  Pharmaceutical: Eliquis              -  antiplatelet therapy: N/A 3. Pain Management: Oxycodone  as needed 4. Mood/Behavior/Sleep: Melatonin 3 mg nightly.  Provide emotional support             -antipsychotic agents: Seroquel  25 mg nightly home med 5. Neuropsych/cognition: This patient is not capable of making decisions on his own behalf. 6. Skin/Wound Care: Routine skin checks- Right arm IV site looks ok but no longer is using will d/c 7. Fluids/Electrolytes/Nutrition: Routine N and outs with follow-up chemistries. 8.  Seizure disorder.  Keppra  1000 mg twice daily check levetiracetam  level  9.  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  Continue Eliquis .  Cardiac rate controlled.  Follow-up cardiology services. Daily weight.  Filed Weights   11/15/24 0500 11/15/24 0525 11/16/24 0500  Weight: 73.9 kg 73.2 kg 73.2 kg  HR stable  10.  Hypertension.  Coreg  12.5 mg twice daily.  Monitor with increased mobility Vitals:   11/15/24 1944 11/16/24 0623  BP: (!) 128/90 (!) 143/82  Pulse:  84 88  Resp: 18 18  Temp: 97.8 F (36.6 C) 97.7 F (36.5 C)  SpO2: 97% 94%  1/7 BP fair control, continue current regimen and monitor 11.  Hyperlipidemia.  Lipitor 12.  Constipation.  MiraLAX  daily.  - LBM 1/5, continue current regimen 13.  BPH/history of hematuria.  Check PVR.  Patient on Flomax . U/A 11/09/24 did not indicate infection  prior to admission but only taking it as needed 1/5 improved, denies recent hematuria, continent 14.  Osteoporosis.  Os-Cal 1250 mg twice daily 15.  Cough. Check CXR- no acute findings  16.  Leukocytosis without fever       Latest Ref Rng & Units 11/13/2024    6:07 AM 11/10/2024    2:20 AM 11/04/2024    1:34 AM  CBC  WBC 4.0 - 10.5 K/uL 14.3  11.7  4.3   Hemoglobin 13.0 - 17.0 g/dL 85.8  85.6  86.3   Hematocrit 39.0 - 52.0 % 40.8  42.3  39.6   Platelets 150 - 400 K/uL 264  305  213    On decadron  4mg  BID which is likely cause   LOS: 6 days A FACE TO FACE EVALUATION WAS PERFORMED  Prentice FORBES Compton 11/16/2024, 7:47 AM     "

## 2024-11-16 NOTE — Progress Notes (Signed)
 Occupational Therapy Discharge Summary  Patient Details  Name: Wayne Molina MRN: [id number] Date of Birth: 01-31-1956  Date of Discharge from OT service:November 16, 2024  Today's Date: 11/16/2024 OT Individual Time: 9263-9154 OT Individual Time Calculation (min): 69 min   Patient has met 10 of 10 long term goals due to improved activity tolerance, improved balance, postural control, ability to compensate for deficits, improved attention, improved awareness, and improved coordination.  Patient to discharge at overall Supervision level.  Patient's care partner is independent to provide the necessary physical and cognitive assistance at discharge.    Reasons goals not met: All goals met  Recommendation:  Patient will benefit from ongoing skilled OT services in home health setting to continue to advance functional skills in the area of BADL, iADL, and Reduce care partner burden.  Equipment: No equipment provided. Pt owns a TTB and BSC.   Reasons for discharge: treatment goals met and discharge from hospital  Patient/family agrees with progress made and goals achieved: Yes  OT Discharge Skilled Therapeutic Interventions/Progress Updates:  Pt received resting in bed with wife present in room. Pt presenting to be tired d/t not sleeping well, however in good spirits receptive to skilled OT session reporting 0/10 pain- OT offering intermittent rest breaks, repositioning, and therapeutic support to optimize participation in therapy session. Pt requesting to take shower and complete ADLs this session. He transitioned to EOB MOD I. Provided education on CVA etiology/recovery process, fall prevention, energy conservation techniques, and simple home modifications to increase Pt's safety. Education provided on utilizing gait belt 24/7 at d/c. Recommending Pt have 24/7 supervision at d/c, DME recommendations of TTB, and follow-up HHOT services with Pt and family in agreement with plan. Engaged Pt's wife in  assisting Pt during session for hand's on training- able to provide close SUP-CGA and demonstrated appropriate insight into Pt's deficits. Pt complete U/LB bathing SUP seated in shower, UB dressing EOB MOD I, and LB dressing EOB SUP. Pt requires intermittent verbal cues for hand positioning during sit<>stands and proximity to RW during transfers. Pt and Pt's wife reporting they feel prepared for d/c tomorrow. Pt was left resting in bed with call bell in reach, bed alarm on, and all needs met.   Precautions/Restrictions  Precautions Precautions: Fall Precaution/Restrictions Comments: L weakness, R eye ptosis Restrictions Weight Bearing Restrictions Per Provider Order: No Pain Pain Assessment Pain Scale: 0-10 Pain Score: 0-No pain ADL ADL Equipment Provided: Reacher Eating: Independent Where Assessed-Eating: Bed level Grooming: Modified independent Where Assessed-Grooming: Sitting at sink, Standing at sink Upper Body Bathing: Supervision/safety Where Assessed-Upper Body Bathing: Shower Lower Body Bathing: Supervision/safety Where Assessed-Lower Body Bathing: Shower Upper Body Dressing: Modified independent (Device) Where Assessed-Upper Body Dressing: Edge of bed Lower Body Dressing: Supervision/safety Where Assessed-Lower Body Dressing: Edge of bed Toileting: Supervision/safety Where Assessed-Toileting: Teacher, Adult Education: Close supervision Toilet Transfer Method: Ambulating, Stand pivot Acupuncturist: Grab bars, Raised toilet seat Tub/Shower Transfer: Close supervison Web Designer Method: Ambulating, Stand pivot Tub/Shower Equipment: Grab bars, Insurance Underwriter: Close supervision Film/video Editor Method: Designer, Industrial/product: Grab bars, Shower seat with back Vision Baseline Vision/History: 0 No visual deficits (R eye ptosis at baseline) Patient Visual Report: Other (comment) (decreased vision in L  eye) Vision Assessment?: Yes Eye Alignment: Within Functional Limits Ocular Range of Motion: Restricted on the right;Restricted on the left;Restricted looking down;Impaired-to be further tested in functional context Alignment/Gaze Preference: Other (comment) (maintains R eye closed d/t ptosis) Tracking/Visual Pursuits: Decreased  smoothness of horizontal tracking;Decreased smoothness of vertical tracking;Decreased smoothness of eye movement to LEFT inferior field;Decreased smoothness of eye movement to RIGHT inferior field;Other (comment) Saccades: Undershoots;Impaired - to be further tested in functional context Visual Fields: Left homonymous hemianopsia Depth Perception: Undershoots Additional Comments: L visual field deficit, R eye lid Ptosis Perception  Perception: Within Functional Limits Praxis Praxis: WFL Cognition Cognition Overall Cognitive Status: Impaired/Different from baseline Arousal/Alertness: Awake/alert Orientation Level: Person;Place;Situation Person: Oriented Place: Oriented Situation: Oriented Memory: Impaired Memory Impairment: Storage deficit;Retrieval deficit Decreased Short Term Memory: Functional basic Attention: Selective;Sustained;Focused Focused Attention: Appears intact Sustained Attention: Appears intact Selective Attention: Impaired Awareness: Impaired Awareness Impairment: Anticipatory impairment Problem Solving: Impaired Executive Function: Decision Making;Self Correcting Decision Making: Impaired Self Correcting: Impaired Safety/Judgment: Impaired Brief Interview for Mental Status (BIMS) Repetition of Three Words (First Attempt): 3 Temporal Orientation: Year: Correct Temporal Orientation: Month: Missed by 6 days to 1 month Temporal Orientation: Day: Correct Recall: Sock: No, could not recall Recall: Blue: Yes, no cue required Recall: Bed: Yes, after cueing (a piece of furniture) BIMS Summary Score: 11 Sensation Sensation Light  Touch: Appears Intact Coordination Gross Motor Movements are Fluid and Coordinated: No Fine Motor Movements are Fluid and Coordinated: No Coordination and Movement Description: slow to initiate, slow to correct even with vc/ tc Finger Nose Finger Test: undershooting with slowed movements Motor  Motor Motor: Other (comment) Motor - Skilled Clinical Observations: slow to initiate, slow to correct even with vc/ tc Motor - Discharge Observations: L hemipareisis, improved from eval Mobility  Bed Mobility Bed Mobility: Supine to Sit;Sit to Supine;Sitting - Scoot to Edge of Bed Supine to Sit: Independent with assistive device Sitting - Scoot to Edge of Bed: Independent with assistive device Sit to Supine: Independent with assistive device Transfers Sit to Stand: Independent with assistive device Stand to Sit: Independent with assistive device  Trunk/Postural Assessment  Cervical Assessment Cervical Assessment: Exceptions to [facility] (forward head with rounding) Thoracic Assessment Thoracic Assessment: Exceptions to [facility] (rounded shoulders, mild kyphosis) Lumbar Assessment Lumbar Assessment: Exceptions to [facility] (post tilt) Postural Control Postural Control: Deficits on evaluation Righting Reactions: delayed Protective Responses: delayed  Balance Balance Balance Assessed: Yes Static Sitting Balance Static Sitting - Balance Support: Feet supported Static Sitting - Level of Assistance: 7: Independent Dynamic Sitting Balance Dynamic Sitting - Balance Support: Feet supported;During functional activity Dynamic Sitting - Level of Assistance: 6: Modified independent (Device/Increase time) Static Standing Balance Static Standing - Balance Support: During functional activity;Left upper extremity supported Static Standing - Level of Assistance: 5: Stand by assistance Dynamic Standing Balance Dynamic Standing - Balance Support: During functional activity;Bilateral upper extremity supported Dynamic  Standing - Level of Assistance: 5: Stand by assistance Extremity/Trunk Assessment RUE Assessment RUE Assessment: Within Functional Limits LUE Assessment LUE Assessment: Within Functional Limits Active Range of Motion (AROM) Comments: [facility] General Strength Comments: 4-/5   Katheryn SHAUNNA [place] 11/16/2024, 8:48 AM

## 2024-11-16 NOTE — Progress Notes (Signed)
 Inpatient Rehabilitation Discharge Medication Review by a Pharmacist  A complete drug regimen review was completed for this patient to identify any potential clinically significant medication issues.  High Risk Drug Classes Is patient taking? Indication by Medication  Antipsychotic Yes Quetiapine  - mood  Anticoagulant Yes Apixaban  - afib  Antibiotic No   Opioid No   Antiplatelet No   Hypoglycemics/insulin  No   Vasoactive Medication Yes Tamsulosin  - BPH Carvedilol  - HTN  Chemotherapy Yes, Oral Chemotherapy Temzolomide - glioblastoma  Other Yes APAP - prn pain Atorvastatin  - HLD CaCarbonate - supplement, osteoporosis Dexamethasone  - glioblastoma Gabapentin  - neuropathic pain Levetiracetam  - seizures due to glioblastoma MagOx - supplement Melatonin - prn sleep Miralax  - prn constipation MVI - supplement Nystatin  powder - antifungal Pantoprazole  - GERD Vitamin B12 - supplement     Type of Medication Issue Identified Description of Issue Recommendation(s)  Drug Interaction(s) (clinically significant)     Duplicate Therapy     Allergy     No Medication Administration End Date     Incorrect Dose     Additional Drug Therapy Needed     Significant med changes from prior encounter (inform family/care partners about these prior to discharge).    Other       Clinically significant medication issues were identified that warrant physician communication and completion of prescribed/recommended actions by midnight of the next day:  No  Name of provider notified for urgent issues identified:   Provider Method of Notification:     Pharmacist comments:   Time spent performing this drug regimen review (minutes):  15   Endre Coutts, Pharm.D., BCPS Clinical Pharmacist Clinical phone for 11/16/2024 from 7:30-3:00 is [phone].  **Pharmacist phone directory can be found on [email or web address] listed under [facility].  11/16/2024 10:16 AM

## 2024-11-16 NOTE — Progress Notes (Signed)
 Speech Language Pathology Discharge Summary  Patient Details  Name: Wayne Molina MRN: [id number] Date of Birth: 01-09-1956  Date of Discharge from SLP service:November 16, 2024  Today's Date: 11/16/2024 SLP Individual Time: 1000-1100 SLP Individual Time Calculation (min): 60 min   Skilled Therapeutic Interventions:   Skilled therapy session focused on cognitive goals. SLP facilitated session by reviewing orientation. Patient independently oriented to self, location and general situation, however required modA for orientation to time and recall after a delay (using external aid). SLP targeted problem solving goals through compare/contrast task. Given two items, patient recalled how they were similiar/different given modA. SLP then prompted re-completion of holiday/month matching task targeted earlier this week. Patient required minA to complete this date - an improvement from prior! Patient left in [facility] with wife present.     Patient has met 2 of 6 long term goals.  Patient to discharge at overall Supervision;Mod level.  Reasons goals not met: cont to require mod-maxA during cognitive tasks   Clinical Impression/Discharge Summary:  Pt has made fair gains and has met 2 of 6 LTG's this admission due to improved speech intelligibility and toleration of regular/thin diet. Pt is currently an overall modA for cognitive tasks and therefore did not meet minA goals this admission. Of note, patient lethargic throughout ST sessions and is discharging before anticipated date due to improvements in physical abilities. Patient requires supervision cues for utilization of swallowing compensatory strategies to minimize overt s/sx of aspiration with regular/thin diet. Pt/family education complete and pt will discharge home with 24 hour supervision from friends/family/etc. Pt would benefit from [facility] f/u ST services to maximize cognition in order to maximize functional independence.   Care Partner:  Caregiver Able to  Provide Assistance: Yes  Type of Caregiver Assistance: Physical;Cognitive  Recommendation:  Home Health SLP  Rationale for SLP Follow Up: Maximize cognitive function and independence   Equipment: n/a   Reasons for discharge: Discharged from hospital   Patient/Family Agrees with Progress Made and Goals Achieved: Yes    Davis Vannatter M.A., CCC-SLP 11/16/2024, 11:07 AM

## 2024-11-16 NOTE — Progress Notes (Signed)
 Physical Therapy Discharge Summary  Patient Details  Name: Wayne Molina MRN: [id number] Date of Birth: 1956/09/10  Date of Discharge from PT service:November 16, 2024  Today's Date: 11/16/2024   Patient has met 10 of 10 long term goals due to improved activity tolerance, increased strength, functional use of  left upper extremity and left lower extremity, and improved awareness.  Patient to discharge at an ambulatory level close supervision for household distances.   Patient's care partner is independent to provide the necessary physical and cognitive assistance at discharge.  Reasons goals not met: n/a  Recommendation:  Patient will benefit from ongoing skilled PT services in home health setting to continue to advance safe functional mobility, address ongoing impairments in strength, coordination, balance, activity tolerance, cognition, safety awareness, and to minimize fall risk.  Equipment: No equipment provided  Reasons for discharge: treatment goals met and discharge from hospital  Patient/family agrees with progress made and goals achieved: Yes  PT Discharge Precautions/Restrictions Precautions Precautions: Fall Precaution/Restrictions Comments: L weakness, R eye ptosis Restrictions Weight Bearing Restrictions Per Provider Order: No Pain Interference Pain Interference Pain Effect on Sleep: 1. Rarely or not at all Pain Interference with Therapy Activities: 1. Rarely or not at all Pain Interference with Day-to-Day Activities: 1. Rarely or not at all Vision/Perception  Vision - History Ability to See in Adequate Light: 1 Impaired Vision - Assessment Eye Alignment: Within Functional Limits Ocular Range of Motion: Restricted on the right;Restricted on the left;Restricted looking down;Impaired-to be further tested in functional context Tracking/Visual Pursuits: Decreased smoothness of horizontal tracking;Decreased smoothness of vertical tracking;Decreased smoothness of eye  movement to LEFT inferior field;Decreased smoothness of eye movement to RIGHT inferior field;Other (comment) Saccades: Undershoots;Impaired - to be further tested in functional context Additional Comments: L visual field deficit, R eye lid Ptosis Perception Perception: Within Functional Limits Praxis Praxis: WFL  Cognition Overall Cognitive Status: Impaired/Different from baseline Arousal/Alertness: Awake/alert Orientation Level: Oriented X4 Attention: Focused;Sustained Focused Attention: Appears intact Sustained Attention: Appears intact Memory: Impaired Awareness: Impaired Problem Solving: Impaired Executive Function: Decision Making;Self Correcting Decision Making: Impaired Self Correcting: Impaired Safety/Judgment: Impaired Sensation Sensation Light Touch: Appears Intact Coordination Gross Motor Movements are Fluid and Coordinated: No Fine Motor Movements are Fluid and Coordinated: No Coordination and Movement Description: slow to initiate, slow to correct even with vc/ tc Motor  Motor Motor: Other (comment) (hemipareisis) Motor - Skilled Clinical Observations: slow to initiate, slow to correct even with vc/ tc  Mobility Bed Mobility Bed Mobility: Supine to Sit;Sit to Supine;Sitting - Scoot to Edge of Bed Supine to Sit: Independent with assistive device Sitting - Scoot to Edge of Bed: Independent with assistive device Sit to Supine: Independent with assistive device Transfers Transfers: Sit to Stand;Stand to Sit;Stand Pivot Transfers Sit to Stand: Independent with assistive device Stand to Sit: Supervision/Verbal cueing Stand Pivot Transfers: Supervision/Verbal cueing Transfer (Assistive device): Rolling walker Locomotion  Gait Ambulation: Yes Gait Assistance: Contact Guard/Touching assist;Supervision/Verbal cueing Gait Distance (Feet): 70 Feet Assistive device: Rolling walker Gait Assistance Details: Verbal cues for safe use of DME/AE Gait Gait: Yes Gait  Pattern: Impaired Gait Pattern: Decreased step length - right;Decreased step length - left;Trunk flexed;Wide base of support;Poor foot clearance - left Gait velocity: decreased Stairs / Additional Locomotion Stairs: Yes Stairs Assistance: Contact Guard/Touching assist Stair Management Technique: Two rails;Forwards Number of Stairs: 4 Height of Stairs: 6 Ramp: Contact Guard/touching assist Wheelchair Mobility Wheelchair Mobility: Yes Wheelchair Assistance: Doctor, General Practice: Both upper extremities Wheelchair Parts Management: Needs  assistance Distance: 169ft straight path  Trunk/Postural Assessment  Cervical Assessment Cervical Assessment: Exceptions to [facility] (forward head with rounding to cervical spine) Thoracic Assessment Thoracic Assessment: Exceptions to [facility] (rounded shoulders and mild kyphosis) Lumbar Assessment Lumbar Assessment: Exceptions to [facility] (posterior pelvic tilt with decreased lordosis) Postural Control Postural Control: Deficits on evaluation Righting Reactions: delayed Protective Responses: delayed  Balance Balance Balance Assessed: Yes Static Sitting Balance Static Sitting - Balance Support: Feet supported Static Sitting - Level of Assistance: 7: Independent Dynamic Sitting Balance Dynamic Sitting - Balance Support: Feet supported;During functional activity Dynamic Sitting - Level of Assistance: 6: Modified independent (Device/Increase time) Static Standing Balance Static Standing - Balance Support: During functional activity;Left upper extremity supported Static Standing - Level of Assistance: 5: Stand by assistance Dynamic Standing Balance Dynamic Standing - Balance Support: During functional activity;Bilateral upper extremity supported Dynamic Standing - Level of Assistance: 5: Stand by assistance;Other (comment) (CGA) Extremity Assessment      RLE Assessment RLE Assessment: Exceptions to [facility] RLE Strength Right Hip  Flexion: 3+/5 Right Hip Extension: 3+/5 Right Hip ADduction: 4-/5 Right Knee Flexion: 4-/5 Right Knee Extension: 4/5 Right Ankle Dorsiflexion: 4/5 Right Ankle Plantar Flexion: 4-/5 LLE Assessment LLE Assessment: Exceptions to [facility] LLE Strength Left Hip Flexion: 3+/5 Left Hip Extension: 3+/5 Left Hip ABduction: 3+/5 Left Hip ADduction: 3+/5 Left Knee Flexion: 3+/5 Left Knee Extension: 4-/5 Left Ankle Dorsiflexion: 4-/5 Left Ankle Plantar Flexion: 4-/5   Mliss DELENA Milliner PT, DPT, CSRS 11/16/2024, 7:13 AM

## 2024-11-16 NOTE — Progress Notes (Signed)
 Inpatient Rehabilitation Care Coordinator Discharge Note   Patient Details  Name: Wayne Molina MRN: [id number] Date of Birth: Jun 29, 1956   Discharge location: HOME WITH WIFE WHO HAS BEEN STAYING HERE WITH HIM AND PROVIDING CARE PRIOR TO ADMISSION  Length of Stay: 7 DAYS  Discharge activity level: SUPERVISION-CGA LEVEL  Home/community participation: ACTIVE  Patient response un:Yzjouy Literacy - How often do you need to have someone help you when you read instructions, pamphlets, or other written material from your doctor or pharmacy?: Always  Patient response un:Dnrpjo Isolation - How often do you feel lonely or isolated from those around you?: Never  Services provided included: MD, RD, PT, OT, SLP, RN, CM, TR, Pharmacy, SW  Financial Services:  Field Seismologist Utilized: Private Insurance [employer]OT[employer]  Choices offered to/list presented to: PT AND WIFE  Follow-up services arranged:  Home Health, Patient/Family request agency HH/DME Home Health Agency: [facility]  PT  OT   SP   HAS ALL EQUIPMENT FROM PREVIOUS ADMISSION   HH/DME Requested Agency: [facility] IN 08/2024  Patient response to transportation need: Is the patient able to respond to transportation needs?: Yes In the past 12 months, has lack of transportation kept you from medical appointments or from getting medications?: No In the past 12 months, has lack of transportation kept you from meetings, work, or from getting things needed for daily living?: No   Patient/Family verbalized understanding of follow-up arrangements:  Yes  Individual responsible for coordination of the follow-up plan: Wayne Molina  [phone]  Confirmed correct DME delivered: Raymonde Asberry MATSU 11/16/2024    Comments (or additional information):WIFE HAS BEEN HERE AND PARTICIPATED IN [facility] CARE. FEELS COMFORTABLE WITH CARE AND GOING HOME. PT READY TO GO HOME  Summary of Stay    Date/Time Discharge Planning CSW  11/15/24 [phone] Home with  wife who has been providing care prior to admission due to pt's illness. Hoping he will get stronger here and mobile like he was PTA. Wife stays here with him to provide support and participate in his care BGD       Nowell Sites G

## 2024-11-16 NOTE — Plan of Care (Signed)
  Problem: RH Balance Goal: LTG: Patient will maintain dynamic sitting balance (OT) Description: LTG:  Patient will maintain dynamic sitting balance with assistance during activities of daily living (OT) Outcome: Completed/Met Goal: LTG Patient will maintain dynamic standing with ADLs (OT) Description: LTG:  Patient will maintain dynamic standing balance with assist during activities of daily living (OT)  Outcome: Completed/Met   Problem: Sit to Stand Goal: LTG:  Patient will perform sit to stand in prep for activites of daily living with assistance level (OT) Description: LTG:  Patient will perform sit to stand in prep for activites of daily living with assistance level (OT) Outcome: Completed/Met   Problem: RH Grooming Goal: LTG Patient will perform grooming w/assist,cues/equip (OT) Description: LTG: Patient will perform grooming with assist, with/without cues using equipment (OT) Outcome: Completed/Met   Problem: RH Bathing Goal: LTG Patient will bathe all body parts with assist levels (OT) Description: LTG: Patient will bathe all body parts with assist levels (OT) Outcome: Completed/Met   Problem: RH Dressing Goal: LTG Patient will perform upper body dressing (OT) Description: LTG Patient will perform upper body dressing with assist, with/without cues (OT). Outcome: Completed/Met Goal: LTG Patient will perform lower body dressing w/assist (OT) Description: LTG: Patient will perform lower body dressing with assist, with/without cues in positioning using equipment (OT) Outcome: Completed/Met   Problem: RH Toileting Goal: LTG Patient will perform toileting task (3/3 steps) with assistance level (OT) Description: LTG: Patient will perform toileting task (3/3 steps) with assistance level (OT)  Outcome: Completed/Met   Problem: RH Toilet Transfers Goal: LTG Patient will perform toilet transfers w/assist (OT) Description: LTG: Patient will perform toilet transfers with assist,  with/without cues using equipment (OT) Outcome: Completed/Met   Problem: RH Tub/Shower Transfers Goal: LTG Patient will perform tub/shower transfers w/assist (OT) Description: LTG: Patient will perform tub/shower transfers with assist, with/without cues using equipment (OT) Outcome: Completed/Met   

## 2024-11-16 NOTE — Progress Notes (Signed)
 Physical Therapy Session Note  Patient Details  Name: Wayne Molina MRN: [id number] Date of Birth: 10-01-1956  Today's Date: 11/16/2024 PT Individual Time: 1430-1525 PT Individual Time Calculation (min): 55 min   Short Term Goals: Week 1:  PT Short Term Goal 1 (Week 1): STG = LTG d/t ELOS  Skilled Therapeutic Interventions/Progress Updates:       Pt presents in bed with his wife at bedside. Pt has no reports of pain. Both aware of DC plan tomorrow and have no therapy related questions. Only question is re: medication which was relayed to the RN.   Pt completed bed mobility without assist. Donned x2 gowns to keep modesty and pt completes sit<>stand to RW with supervision from bed. Ambulates ~65' with his wife providing CGA and PT providing supervision. Cues for keeping body within walker frame and for upright posture, tends to drift from his RW as he becomes fatigued.   Transported remaining distance to ortho gym to review car transfers. Car height set to simulate their personal vehicle. Pt completed car transfer with supervision and RW - cues for safety approach and general positioning, pt able to manage BLE in/out of vehicle without external assist.   In main gym, reviewed curb transfers using the RW. After completion of 5 curb transfer with RW (minA), wife reporting they have a portable ramp for their home entrance that they typically use for the curb. Encouraged them to continue using the curb for safety and fall prevention.   Provided patient and family with HEP handout as written below.   Access Code: [email or web address] URL: [email or web address] Date: 11/16/2024 Prepared by: Sherlean Perks  Exercises - Sit to Stand with Counter Support  - 1 x daily - 7 x weekly - 3 sets - 10 reps - Standing March with Counter Support  - 1 x daily - 7 x weekly - 3 sets - 10 reps - Standing Hip Abduction with Counter Support  - 1 x daily - 7 x weekly - 3 sets - 10 reps - Heel Raises with  Counter Support  - 1 x daily - 7 x weekly - 3 sets - 10 reps - Mini Squat with Counter Support  - 1 x daily - 7 x weekly - 3 sets - 10 reps - Side Stepping with Counter Support  - 1 x daily - 7 x weekly - 3 sets - 10 reps - Wide Stance with Counter Support  - 1 x daily - 7 x weekly - 3 sets - 10 reps - Narrow Stance with Counter Support  - 1 x daily - 7 x weekly - 3 sets - 10 reps  Pt reporting BLE fatigue and requesting to return to his room to rest. Transported via w/c and assisted to bed via stand pivot transfer using the RW, CGA for safety given his fatigue. Pt ended session seated EOB, wife at bedside. Needs met. He missed the last 15 minutes of therapy treatment due to fatigue.   Therapy Documentation Precautions:  Precautions Precautions: Fall Precaution/Restrictions Comments: L weakness, R eye ptosis Restrictions Weight Bearing Restrictions Per Provider Order: No General:     Therapy/Group: Individual Therapy  [name] 11/16/2024, 7:51 AM

## 2024-11-16 NOTE — Plan of Care (Signed)
" °  Problem: RH Swallowing Goal: LTG Patient will consume least restrictive diet using compensatory strategies with assistance (SLP) Description: LTG:  Patient will consume least restrictive diet using compensatory strategies with assistance (SLP) Outcome: Completed/Met   Problem: RH Expression Communication Goal: LTG Patient will verbally express basic/complex needs(SLP) Description: LTG:  Patient will verbally express basic/complex needs, wants or ideas with cues  (SLP) Outcome: Completed/Met   Problem: RH Cognition - SLP Goal: RH LTG Patient will demonstrate orientation with cues Description:  LTG:  Patient will demonstrate orientation to person/place/time/situation with cues (SLP)   Outcome: Not Met (cont to require modA)   Problem: RH Problem Solving Goal: LTG Patient will demonstrate problem solving for (SLP) Description: LTG:  Patient will demonstrate problem solving for basic/complex daily situations with cues  (SLP) Outcome: Not Met (cont to require modA)   Problem: RH Memory Goal: LTG Patient will demonstrate ability for day to day (SLP) Description: LTG:   Patient will demonstrate ability for day to day recall/carryover during cognitive/linguistic activities with assist  (SLP) Outcome: Not Met (cont to require modA)   Problem: RH Awareness Goal: LTG: Patient will demonstrate awareness during functional activites type of (SLP) Description: LTG: Patient will demonstrate awareness during functional activites type of (SLP) Outcome: Not Met (cont to require modA)   "

## 2024-11-17 ENCOUNTER — Encounter: Payer: Self-pay | Admitting: Internal Medicine

## 2024-11-17 ENCOUNTER — Other Ambulatory Visit (HOSPITAL_COMMUNITY): Payer: Self-pay

## 2024-11-17 MED ORDER — DEXAMETHASONE 4 MG PO TABS
4.0000 mg | ORAL_TABLET | Freq: Every day | ORAL | 0 refills | Status: DC
  Filled 2024-11-17: qty 30, 30d supply, fill #0

## 2024-11-17 MED ORDER — DEXAMETHASONE 4 MG PO TABS: 4.0000 mg | ORAL_TABLET | Freq: Every day | ORAL | Status: DC

## 2024-11-17 NOTE — Progress Notes (Signed)
 "                                                        PROGRESS NOTE   Subjective/Complaints:  Discussed d/c , need for MD f/u with PCP, Neuro Onc and Cardiology  ROS- neg fevers, chills, CP, SOB, N/V/D Objective:   CT HEAD WO CONTRAST ( ) Result Date: 11/15/2024 EXAM: CT HEAD WITHOUT CONTRAST 11/15/2024 03:09:07 PM TECHNIQUE: CT of the head was performed without the administration of intravenous contrast. Automated exposure control, iterative reconstruction, and/or weight based adjustment of the mA/kV was utilized to reduce the radiation dose to as low as reasonably achievable. COMPARISON: Head CT and MRI 11/03/2024. CLINICAL HISTORY: Glioblastoma; decreased mental acuity assess for increased edema. FINDINGS: BRAIN AND VENTRICLES: A heterogeneous, predominantly hypodense mass is again seen in the right temporoparietal region and involving the splenium of the corpus callosum consistent with the known glioblastoma. Small foci of calcification are present in this region, likely along with some nonacute blood products. The mass overall appears mildly smaller than on the prior CT. There is only at most very mild surrounding edema which has greatly decreased from prior. There is persistent mild mass effect on the right lateral ventricle. No acute intracranial hemorrhage, acute large territory infarct, midline shift, or extra-axial fluid collection is identified. There is no interval ventricular dilatation. A biopsy track is again noted in the posterior right temporal lobe. Calcified atherosclerosis at the skull base. ORBITS: No acute abnormality. SINUSES: No acute abnormality. SOFT TISSUES AND SKULL: Right temporoparietal burr hole. No acute soft tissue abnormality. No skull fracture. IMPRESSION: 1. 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 No definite progressive or acute findings. Electronically signed by: Dasie Hamburg MD MD 11/15/2024 03:28 PM EST RP Workstation: [id number]     No results for input(s): WBC, HGB, HCT, PLT in the last 72 hours.  No results for input(s): NA, K, CL, CO2, GLUCOSE, BUN, CREATININE, CALCIUM  in the last 72 hours.   Intake/Output Summary (Last 24 hours) at 11/17/2024 0810 Last data filed at 11/17/2024 0721 Gross per 24 hour  Intake 956 ml  Output --  Net 956 ml        Physical Exam: Vital Signs Blood pressure 114/74, pulse 60, temperature (!) 97.5 F (36.4 C), resp. rate 17, height 5' 8 (1.727 m), weight 73 kg, SpO2 95%.  EENT- strabismus Left eye General: No acute distress, laying in bed appears comfortable Mood and affect are appropriate Heart: Regular rate and rhythm no rubs murmurs or extra sounds Lungs: Clear to auscultation, breathing unlabored, no rales or wheezes Abdomen: Positive bowel sounds, soft nontender to palpation, nondistended Extremities: No clubbing, cyanosis, or edema Skin: Warm and dry Neurologic: Cranial nerves II through XII intact, motor strength is 5/5 in bilateral deltoid, bicep, tricep, grip, hip flexor, knee extensors, ankle dorsiflexor and plantar flexor Sitting balance fair, leans backward at EOB , needs cuing  Musculoskeletal: Full range of motion in all 4 extremities. No joint swelling  Prior neuro assessment is c/w today's exam 11/17/2024.   Assessment/Plan: 1. Functional deficits due to cerebral edema resulting from glioblastoma Stable for D/C today F/u PCP in 3-4 weeks Has cardiology appt 1/18 F/u Dr [name] , neuro onc 2 weeks No PMR f/u needed See D/C summary See D/C instructions   Care Tool:  Bathing    Body parts bathed by patient: Right arm, Chest, Left arm, Abdomen, Front perineal area, Face, Left lower leg, Right lower leg, Buttocks, Right upper leg, Left upper leg   Body parts bathed by helper: Buttocks, Left lower leg, Right lower leg, Front perineal area, Right arm, Left arm     Bathing assist Assist Level: Supervision/Verbal cueing     Upper  Body Dressing/Undressing Upper body dressing   What is the patient wearing?: Pull over shirt    Upper body assist Assist Level: Independent with assistive device    Lower Body Dressing/Undressing Lower body dressing      What is the patient wearing?: Underwear/pull up, Pants     Lower body assist Assist for lower body dressing: Supervision/Verbal cueing     Toileting Toileting    Toileting assist Assist for toileting: Supervision/Verbal cueing     Transfers Chair/bed transfer  Transfers assist     Chair/bed transfer assist level: Supervision/Verbal cueing     Locomotion Ambulation   Ambulation assist      Assist level: Contact Guard/Touching assist Assistive device: Walker-rolling Max distance: 70 ft   Walk 10 feet activity   Assist     Assist level: Supervision/Verbal cueing Assistive device: Walker-rolling   Walk 50 feet activity   Assist Walk 50 feet with 2 turns activity did not occur: Safety/medical concerns  Assist level: Contact Guard/Touching assist Assistive device: Walker-rolling    Walk 150 feet activity   Assist Walk 150 feet activity did not occur: Safety/medical concerns         Walk 10 feet on uneven surface  activity   Assist Walk 10 feet on uneven surfaces activity did not occur: Safety/medical concerns         Wheelchair     Assist Is the patient using a wheelchair?: Yes Type of Wheelchair: Manual    Wheelchair assist level: Supervision/Verbal cueing Max wheelchair distance: 145ft    Wheelchair 50 feet with 2 turns activity    Assist        Assist Level: Supervision/Verbal cueing   Wheelchair 150 feet activity     Assist      Assist Level: Minimal Assistance - Patient > 75%   Blood pressure 114/74, pulse 60, temperature (!) 97.5 F (36.4 C), resp. rate 17, height 5' 8 (1.727 m), weight 73 kg, SpO2 95%.  Medical Problem List and Plan: 1. Functional deficits secondary to  glioblastoma with vasogenic edema suspect symptoms due to postradiation inflammation with history of right craniotomy 08/06/2024.  Chronic Decadron  resumed.  Plan to address chemotherapy as outpatient per medical oncology Dr. [name]             -patient may shower             -ELOS/Goals: PT/OT/SLP supervision, 11/17/24 d/c date             - Okay to continue CIR Fatigue - check sleep chart, keppra  level , no new sedating meds Per Dr [name] will reduce decadron  to 4mg  once pt d/ced from CIR  Normal keppra  levels and CT head much improved  2.  Antithrombotics: -DVT/anticoagulation:  Pharmaceutical: Eliquis              -antiplatelet therapy: N/A 3. Pain Management: Oxycodone  as needed 4. Mood/Behavior/Sleep: Melatonin 3 mg nightly.  Provide emotional support             -antipsychotic agents: Seroquel  25 mg nightly home med 5. Neuropsych/cognition: This patient is not  capable of making decisions on his own behalf. 6. Skin/Wound Care: Routine skin checks- Right arm IV site looks ok but no longer is using will d/c 7. Fluids/Electrolytes/Nutrition: Routine N and outs with follow-up chemistries. 8.  Seizure disorder.  Keppra  1000 mg twice daily check levetiracetam  level  9.  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  Continue Eliquis .  Cardiac rate controlled.  Follow-up cardiology services. Daily weight.  Filed Weights   11/15/24 0525 11/16/24 0500 11/17/24 0500  Weight: 73.2 kg 73.2 kg 73 kg  HR stable  10.  Hypertension.  Coreg  12.5 mg twice daily.  Monitor with increased mobility Vitals:   11/16/24 2048 11/17/24 0605  BP: 128/87 114/74  Pulse: 78 60  Resp: 19 17  Temp: 97.8 F (36.6 C) (!) 97.5 F (36.4 C)  SpO2: 95% 95%  1/7 BP fair control, continue current regimen and monitor 11.  Hyperlipidemia.  Lipitor 12.  Constipation.  MiraLAX  daily.  - LBM 1/5, continue current regimen 13.  BPH/history of hematuria.  Check PVR.   Patient on Flomax . U/A 11/09/24 did not indicate infection  prior to admission but only taking it as needed 1/5 improved, denies recent hematuria, continent 14.  Osteoporosis.  Os-Cal 1250 mg twice daily 15.  Cough. Check CXR- no acute findings  16.  Leukocytosis without fever       Latest Ref Rng & Units 11/13/2024    6:07 AM 11/10/2024    2:20 AM 11/04/2024    1:34 AM  CBC  WBC 4.0 - 10.5 K/uL 14.3  11.7  4.3   Hemoglobin 13.0 - 17.0 g/dL 85.8  85.6  86.3   Hematocrit 39.0 - 52.0 % 40.8  42.3  39.6   Platelets 150 - 400 K/uL 264  305  213    On decadron  4mg  BID which is likely cause - reducing decardron to 4mg  every day per neuro onc  LOS: 7 days A FACE TO FACE EVALUATION WAS PERFORMED  Prentice FORBES Compton 11/17/2024, 8:10 AM     "

## 2024-11-20 ENCOUNTER — Telehealth: Payer: Self-pay | Admitting: Internal Medicine

## 2024-11-20 ENCOUNTER — Other Ambulatory Visit: Payer: Self-pay

## 2024-11-20 NOTE — Telephone Encounter (Signed)
 Scheduled patient for a follow-up this Thursday. Called and spoke with the patients wife, she is aware.

## 2024-11-23 ENCOUNTER — Emergency Department (HOSPITAL_COMMUNITY)
Admission: EM | Admit: 2024-11-23 | Discharge: 2024-11-23 | Disposition: A | Discharge status: Home / Self Care | Attending: Emergency Medicine | Admitting: Emergency Medicine

## 2024-11-23 ENCOUNTER — Other Ambulatory Visit: Payer: Self-pay

## 2024-11-23 ENCOUNTER — Encounter: Payer: Self-pay | Admitting: Internal Medicine

## 2024-11-23 ENCOUNTER — Encounter: Payer: Self-pay | Admitting: Pharmacist

## 2024-11-23 ENCOUNTER — Inpatient Hospital Stay: Attending: Internal Medicine | Admitting: Internal Medicine

## 2024-11-23 ENCOUNTER — Other Ambulatory Visit (HOSPITAL_COMMUNITY): Payer: Self-pay

## 2024-11-23 DIAGNOSIS — Z7901 Long term (current) use of anticoagulants: Secondary | ICD-10-CM | POA: Insufficient documentation

## 2024-11-23 DIAGNOSIS — R040 Epistaxis: Secondary | ICD-10-CM | POA: Insufficient documentation

## 2024-11-23 DIAGNOSIS — C719 Malignant neoplasm of brain, unspecified: Secondary | ICD-10-CM

## 2024-11-23 DIAGNOSIS — D72829 Elevated white blood cell count, unspecified: Secondary | ICD-10-CM | POA: Insufficient documentation

## 2024-11-23 DIAGNOSIS — C713 Malignant neoplasm of parietal lobe: Secondary | ICD-10-CM | POA: Diagnosis not present

## 2024-11-23 DIAGNOSIS — Z8673 Personal history of transient ischemic attack (TIA), and cerebral infarction without residual deficits: Secondary | ICD-10-CM | POA: Insufficient documentation

## 2024-11-23 LAB — CBC WITH DIFFERENTIAL/PLATELET
Abs Immature Granulocytes: 0.17 K/uL — ABNORMAL HIGH (ref 0.00–0.07)
Basophils Absolute: 0 K/uL (ref 0.0–0.1)
Basophils Relative: 0 %
Eosinophils Absolute: 0 K/uL (ref 0.0–0.5)
Eosinophils Relative: 0 %
HCT: 45.3 % (ref 39.0–52.0)
Hemoglobin: 15.3 g/dL (ref 13.0–17.0)
Immature Granulocytes: 1 %
Lymphocytes Relative: 6 %
Lymphs Abs: 0.7 K/uL (ref 0.7–4.0)
MCH: 32.4 pg (ref 26.0–34.0)
MCHC: 33.8 g/dL (ref 30.0–36.0)
MCV: 96 fL (ref 80.0–100.0)
Monocytes Absolute: 0.2 K/uL (ref 0.1–1.0)
Monocytes Relative: 2 %
Neutro Abs: 11.6 K/uL — ABNORMAL HIGH (ref 1.7–7.7)
Neutrophils Relative %: 91 %
Platelets: 224 K/uL (ref 150–400)
RBC: 4.72 MIL/uL (ref 4.22–5.81)
RDW: 13 % (ref 11.5–15.5)
WBC: 12.7 K/uL — ABNORMAL HIGH (ref 4.0–10.5)
nRBC: 0 % (ref 0.0–0.2)

## 2024-11-23 MED ORDER — AMOXICILLIN-POT CLAVULANATE 875-125 MG PO TABS
1.0000 | ORAL_TABLET | Freq: Two times a day (BID) | ORAL | 0 refills | Status: DC
  Filled 2024-11-23 (×2): qty 14, 7d supply, fill #0

## 2024-11-23 MED ORDER — OXYMETAZOLINE HCL 0.05 % NA SOLN
2.0000 | Freq: Once | NASAL | Status: AC
  Administered 2024-11-23: 2 via NASAL
  Filled 2024-11-23: qty 30

## 2024-11-23 MED ORDER — TRANEXAMIC ACID FOR EPISTAXIS
500.0000 mg | Freq: Once | TOPICAL | Status: AC
  Administered 2024-11-23: 500 mg via TOPICAL
  Filled 2024-11-23 (×2): qty 10

## 2024-11-23 NOTE — Progress Notes (Signed)
 I connected with Wayne Molina on 11/23/24 at 10:00 AM EST by telephone visit and verified that I am speaking with the correct person using two identifiers.  I discussed the limitations, risks, security and privacy concerns of performing an evaluation and management service by telemedicine and the availability of in-person appointments. I also discussed with the patient that there may be a patient responsible charge related to this service. The patient expressed understanding and agreed to proceed.   Other persons participating in the visit and their role in the encounter:  spouse  Patient's location:  Home Provider's location:  Office  Chief Complaint:  Glioblastoma, IDH-wildtype (HCC)  History of Present Ilness: Wayne Molina reports ongoing very modest improvements in gait and cognition since prior discussion at the end of December.  He is now home from rehab.  Using a walker to ambulate, he is able to get himself to the bathroom which is an improvement.  Still has some confusion, no seizures.  Continues on the decadron  4mg  once per day.  Observations: Language and cognition at baseline  Imaging:  CT HEAD WO CONTRAST ( ) Result Date: 11/15/2024 EXAM: CT HEAD WITHOUT CONTRAST 11/15/2024 03:09:07 PM TECHNIQUE: CT of the head was performed without the administration of intravenous contrast. Automated exposure control, iterative reconstruction, and/or weight based adjustment of the mA/kV was utilized to reduce the radiation dose to as low as reasonably achievable. COMPARISON: Head CT and MRI 11/03/2024. CLINICAL HISTORY: Glioblastoma; decreased mental acuity assess for increased edema. FINDINGS: BRAIN AND VENTRICLES: A heterogeneous, predominantly hypodense mass is again seen in the right temporoparietal region and involving the splenium of the corpus callosum consistent with the known glioblastoma. Small foci of calcification are present in this region, likely along with some nonacute blood  products. The mass overall appears mildly smaller than on the prior CT. There is only at most very mild surrounding edema which has greatly decreased from prior. There is persistent mild mass effect on the right lateral ventricle. No acute intracranial hemorrhage, acute large territory infarct, midline shift, or extra-axial fluid collection is identified. There is no interval ventricular dilatation. A biopsy track is again noted in the posterior right temporal lobe. Calcified atherosclerosis at the skull base. ORBITS: No acute abnormality. SINUSES: No acute abnormality. SOFT TISSUES AND SKULL: Right temporoparietal burr hole. No acute soft tissue abnormality. No skull fracture. IMPRESSION: 1. 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 No definite progressive or acute findings. Electronically signed by: Dasie Hamburg MD MD 11/15/2024 03:28 PM EST RP Workstation: [id number]   DG Chest 2 View Result Date: 11/11/2024 EXAM: 2 VIEW(S) XRAY OF THE CHEST 11/10/2024 06:20:00 PM COMPARISON: 11/03/2024 CLINICAL HISTORY: Cough FINDINGS: LINES, TUBES AND DEVICES: Left chest loop recorder in place. LUNGS AND PLEURA: No focal pulmonary opacity. No pleural effusion. No pneumothorax. HEART AND MEDIASTINUM: Stable mild cardiomegaly. Calcified aortic atherosclerosis. BONES AND SOFT TISSUES: No acute osseous abnormality. UPPER ABDOMEN: Paucity of bowel gas. IMPRESSION: 1. No acute findings. Electronically signed by: Franky Stanford MD 11/11/2024 02:06 AM EST RP Workstation: [id number]   MR BRAIN W WO CONTRAST Result Date: 11/03/2024 EXAM: MRI BRAIN WITH CONTRAST 11/03/2024 10:44:32 PM TECHNIQUE: Multiplanar multisequence MRI of the head/brain was performed with the administration of intravenous contrast. COMPARISON: MRI head 10/04/2024. CLINICAL HISTORY: Brain/CNS neoplasm, assess treatment response FINDINGS: BRAIN AND VENTRICLES: Continued increase in thickness of peripheral enhancement of a complex right  temporoparietal mass .for example, peripheral enhancement measures 1.5 cm in thickness in the right temporal lobe  on series 34, 36, previously 1.1 cm. Peripheral enhancement measures 1.2 cm in thickness posterior medial series 34, 41, previously 0.7 cm . Also, moderate increase in surrounding FLAIR hyperintensity in the right temporal, parietal, and corpus callosal white matter. No new acute intracranial hemorrhage. No mass effect or midline shift. No hydrocephalus. The sella is unremarkable. 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 Scattered small T2 hyperintensities elsewhere in the cerebral white matter bilaterally are unchanged and compatible with mild chronic small vessel ischemic disease. 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 ORBITS: No acute abnormality. SINUSES: No acute abnormality. BONES AND SOFT TISSUES: Normal bone marrow signal and enhancement. No acute soft tissue abnormality. IMPRESSION: 1. Continued increase in peripheral enhancement of the known glioblastoma with increased surrounding FLAIR hyperintense signal, which could represent tumor progression and/or post-treatment change. Electronically signed by: Gilmore Molt 11/03/2024 11:42 PM EST RP Workstation: [id number]   DG Chest 2 View Result Date: 11/03/2024 CLINICAL DATA:  Weakness EXAM: DG CHEST 2V COMPARISON:  July 21, 2024 FINDINGS: Stable cardiomegaly. Both lungs are clear. The visualized skeletal structures are unremarkable. IMPRESSION: No active cardiopulmonary disease. Electronically Signed   By: Lynwood Landy Raddle M.D.   On: 11/03/2024 13:21   CT Head Wo Contrast Result Date: 11/03/2024 EXAM: CT HEAD WITHOUT CONTRAST _study_datetime_ TECHNIQUE: CT of the head was performed without the administration of intravenous contrast. Automated exposure control, iterative reconstruction, and/or weight based adjustment of the mA/kV was utilized to reduce the  radiation dose to as low as reasonably achievable. COMPARISON: MRI 10/04/2024 and CT head 07/26/2024. CLINICAL HISTORY: FINDINGS: BRAIN AND VENTRICLES: Redemonstrated postsurgical changes of right parietal craniotomy for biopsy of known mass lesion. Similar appearance of postprocedure changes along the biopsy tract. There is no evidence of acute intracranial hemorrhage. Right parietal mass is overall slightly increased in size with slight increased involvement of the right splenium of the corpus callosum. There are increased areas of vasogenic edema within the right parietal lobe extending into the posterior right temporal lobe. There is similar effacement of the atrium and occipital horn of the right lateral ventricle. No significant midline shift. Atherosclerosis at the skull base. Remote infarcts in the right cerebellum. No evidence of acute infarct. No hydrocephalus. No extra-axial collection. ORBITS: No acute abnormality. SINUSES: No acute abnormality. SOFT TISSUES AND SKULL: Soft tissue swelling in the left facial soft tissues concerning for possible contusion. No skull fracture. IMPRESSION: 1. Slightly increased size of the right parietal mass with increased involvement of the right splenium of the corpus callosum. Increased associated vasogenic edema in the right parietal lobe. Recommend MRI brain with and without contrast for further evaluation. 2. Soft tissue swelling in the left facial soft tissues, concerning for possible contusion. 3. Similar evolving post-biopsy changes in the right temporoparietal lobes. Electronically signed by: Donnice Mania MD 11/03/2024 12:48 PM EST RP Workstation: [id number]   CUP PACEART REMOTE DEVICE CHECK Result Date: 10/26/2024 ILR summary report received. Battery status OK. Normal device function. No new symptom, tachy, brady, or pause episodes. No new AF episodes. Monthly summary reports and ROV/PRN AB, CVRS  Assessment and Plan: Glioblastoma, IDH-wildtype  (HCC)  Clinically stable to modestly improved, following hospitalization and rehab course.    Recommended repeating MRI brain study in 2 weeks, given progression noted on study from 12/26.  We discussed those changes could be consistent with treatment effect, but they need to be followed up on.  Based on scan results, will discuss resuming cycle #  2 5-day Temodar , or possibly initiating avastin therapy.  Will otherwise recommend continuing the decadron  4mg  once per day in the interim, he is not reporting any tolerability issues.    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  Follow Up Instructions: We ask that GARON MELANDER return to clinic in 1 months following next brain MRI, or sooner as needed.  I discussed the assessment and treatment plan with the patient.  The patient was provided an opportunity to ask questions and all were answered.  The patient agreed with the plan and demonstrated understanding of the instructions.    The patient was advised to call back or seek an in-person evaluation if the symptoms worsen or if the condition fails to improve as anticipated.    Leilany Digeronimo K Liela Rylee, MD   I provided 20 minutes of non face-to-face telephone visit time during this encounter, and > 50% was spent counseling as documented under my assessment & plan.

## 2024-11-23 NOTE — ED Provider Triage Note (Signed)
 Emergency Medicine Provider Triage Evaluation Note  Wayne Molina , a 69 y.o. male  was evaluated in triage.  Pt complains of epistaxis.  Patient presents to ED with concerns of nosebleed that started while he was watching TV.  Reports that show spontaneous but does remark that he may have been picking at his nose.  He does take Eliquis .  Denies any feelings of dizziness or lightheadedness.  Reports the bleeding started earlier today about an hour ago.  Review of Systems  Positive: As above Negative: As above  Physical Exam  BP (!) 151/96 (BP Location: Left Arm)   Pulse (!) 107   Resp 16   SpO2 100%  Gen:   Awake, no distress   Resp:  Normal effort  MSK:   Moves extremities without difficulty  Other:  Right sided nosebleed present  Medical Decision Making  Medically screening exam initiated at 2:28 PM.  Appropriate orders placed.  JAVONNI MACKE was informed that the remainder of the evaluation will be completed by another provider, this initial triage assessment does not replace that evaluation, and the importance of remaining in the ED until their evaluation is complete.     Mahrukh Seguin A, PA-C 11/23/24 1901

## 2024-11-23 NOTE — Discharge Instructions (Addendum)
 You were seen in the ER today for concerns of a nosebleed. Your labs were reassuring but you had a hard time getting your nosebleed under control conservatively. We had to place a balloon in your nose to help apply pressure to stop the bleeding . You will need to be seen by ENT for follow up on this in the next several days. Please reach out to their office to schedule this visit.  I have started you on a antibiotics to reduce the risk of infection if the nasal packing is to remain in place over the next 4-5 days. Once you are seen by ENT, you can ask them if you should continue these antibiotics as we are not treating an infection, but reducing the risk of infection with the nasal packing remaining in place for several days.

## 2024-11-23 NOTE — Progress Notes (Signed)
 This patient is appearing on a report for being at risk of failing the adherence measure for hypertension (ACEi/ARB) medications this calendar year.   Medication: losartan  100 mg   Medication was discontinued by Dr. Crotuiro in 2025. Patient is no longer taking.   Reviewed medication indication, dosing, and goals of therapy.

## 2024-11-23 NOTE — ED Provider Notes (Signed)
 " [place] EMERGENCY DEPARTMENT AT [facility] Provider Note   CSN: [id number] Arrival date & time: 11/23/24  1415     Patient presents with: Epistaxis   Wayne Molina is a 69 y.o. male. Patient with past history significant for prior stroke on blood thinner presents to the ED with concerns of a nosebleed. States that bleeding started spontaneously while watching TV. Denies nasal trauma. No reported fall or impact to the face. He states that he was sitting at home watching TV when his bleeding started and had not resolved on its own.  He called 911 and was transferred here for evaluation.  Denies history of significant or recurrent epistaxis.   Epistaxis      Prior to Admission medications  Medication Sig Start Date End Date Taking? Authorizing Provider  amoxicillin -clavulanate (AUGMENTIN ) 875-125 MG tablet Take 1 tablet by mouth every 12 (twelve) hours. 11/23/24  Yes Keino Placencia A, PA-C  acetaminophen  (TYLENOL ) 325 MG tablet Take 1-2 tablets (325-650 mg total) by mouth every 4 (four) hours as needed for mild pain (pain score 1-3). 08/08/24   Angiulli, Toribio PARAS, PA-C  apixaban  (ELIQUIS ) 5 MG TABS tablet Take 1 tablet (5 mg total) by mouth 2 (two) times daily. 11/16/24   Angiulli, Toribio PARAS, PA-C  atorvastatin  (LIPITOR) 40 MG tablet Take 1 tablet (40 mg total) by mouth daily. 11/16/24   Angiulli, Toribio PARAS, PA-C  calcium  carbonate (OSCAL) 1500 (600 Ca) MG TABS tablet Take 1 tablet (1,500 mg total) by mouth 2 (two) times daily with a meal. 09/08/24   Janna Ferrier, DO  carvedilol  (COREG ) 12.5 MG tablet Take 1 tablet (12.5 mg total) by mouth 2 (two) times daily. 11/16/24   Angiulli, Daniel J, PA-C  Cyanocobalamin (VITAMIN B-12 PO) Take 1 capsule by mouth daily.    [provider]  dexamethasone  (DECADRON ) 4 MG tablet Take 1 tablet (4 mg total) by mouth daily. 11/18/24   Angiulli, Toribio PARAS, PA-C  gabapentin  (NEURONTIN ) 100 MG capsule Take 1 capsule (100 mg total) by mouth 3  (three) times daily. 11/16/24   Angiulli, Toribio PARAS, PA-C  levETIRAcetam  (KEPPRA ) 1000 MG tablet Take 1 tablet (1,000 mg total) by mouth 2 (two) times daily. 11/16/24   Angiulli, Toribio PARAS, PA-C  magnesium  oxide (MAG-OX) 400 (240 Mg) MG tablet Take 0.5 tablets (200 mg total) by mouth daily in the afternoon. 11/16/24   Angiulli, Daniel J, PA-C  melatonin 3 MG TABS tablet Take 1 tablet (3 mg total) by mouth at bedtime. 11/16/24   Angiulli, Daniel J, PA-C  Multiple Vitamin (MULTIVITAMIN ADULT PO) Take 1 tablet by mouth daily. 02/07/18   [provider]  nystatin  (MYCOSTATIN /NYSTOP ) powder Apply topically 2 (two) times daily. 11/15/24   Angiulli, Toribio PARAS, PA-C  pantoprazole  (PROTONIX ) 40 MG tablet Take 1 tablet (40 mg total) by mouth daily. 11/16/24   Angiulli, Toribio PARAS, PA-C  polyethylene glycol (MIRALAX  / GLYCOLAX ) 17 g packet Take 17 g by mouth daily.    [provider]  QUEtiapine  (SEROQUEL ) 25 MG tablet Take 1 tablet (25 mg total) by mouth daily. 11/16/24   Angiulli, Toribio PARAS, PA-C  tamsulosin  (FLOMAX ) 0.4 MG CAPS capsule Take 1 capsule (0.4 mg total) by mouth daily. 11/16/24   Angiulli, Toribio PARAS, PA-C  temozolomide  (TEMODAR ) 140 MG capsule Take 2 capsules (280 mg total) by mouth daily. Take for 5 days on, 23 days off. Repeat every 28 days. May take on an empty stomach to decrease nausea &  vomiting. 10/09/24   Vaslow, Zachary K, MD    Allergies: Patient has no known allergies.    Review of Systems  HENT:  Positive for nosebleeds.   All other systems reviewed and are negative.   Updated Vital Signs BP (!) 121/95 (BP Location: Right Arm)   Pulse 96   Resp 16   SpO2 99%   Physical Exam Vitals and nursing note reviewed.  Constitutional:      General: He is not in acute distress.    Appearance: He is well-developed.  HENT:     Head: Normocephalic and atraumatic.     Nose: Nose normal. No congestion or rhinorrhea.     Comments: Small amount of bleeding present from the left  nostril.  After Afrin, bleeding slowed, but will start again quickly with positional changes. Eyes:     Conjunctiva/sclera: Conjunctivae normal.  Cardiovascular:     Rate and Rhythm: Normal rate and regular rhythm.     Heart sounds: No murmur heard. Pulmonary:     Effort: Pulmonary effort is normal. No respiratory distress.     Breath sounds: Normal breath sounds.  Abdominal:     Palpations: Abdomen is soft.     Tenderness: There is no abdominal tenderness.  Musculoskeletal:        General: No swelling.     Cervical back: Neck supple.  Skin:    General: Skin is warm and dry.     Capillary Refill: Capillary refill takes less than 2 seconds.  Neurological:     Mental Status: He is alert.  Psychiatric:        Mood and Affect: Mood normal.     (all labs ordered are listed, but only abnormal results are displayed) Labs Reviewed  CBC WITH DIFFERENTIAL/PLATELET - Abnormal; Notable for the following components:      Result Value   WBC 12.7 (*)    Neutro Abs 11.6 (*)    Abs Immature Granulocytes 0.17 (*)    All other components within normal limits    EKG: None  Radiology: No results found.   Procedures   Medications Ordered in the ED  oxymetazoline  (AFRIN) 0.05 % nasal spray 2 spray (2 sprays Each Nare Given 11/23/24 1442)  tranexamic acid  (CYKLOKAPRON ) 1000 MG/10ML topical solution 500 mg (500 mg Topical Given by Other 11/23/24 1735)                                    Medical Decision Making Amount and/or Complexity of Data Reviewed Labs: ordered.  Risk OTC drugs. Prescription drug management.   This patient presents to the ED for concern of nosebleed. Differential diagnosis includes anemia, posterior nosebleed, anterior nosebleed, weakness    Additional history obtained:  Additional history obtained from chart review   Lab Tests:  I Ordered, and personally interpreted labs.  The pertinent results include: CBC with mild leukocytosis with stable  hemoglobin   Medicines ordered and prescription drug management:  I ordered medication including Afrin, TXA for epistaxis Reevaluation of the patient after these medicines showed that the patient improved I have reviewed the patients home medicines and have made adjustments as needed   Problem List / ED Course:  Patient presents the emergency department with concerns of epistaxis.  Reportedly had a nosebleed occur mostly at home on the couch.  States that this happened spontaneously.  He cannot recall that he was picking or manipulating at  the nose.  He denies history of recurrent or significant epistaxis.  He is on blood thinners for prior stroke. There is minimal bleeding present on my initial exam, to the right nostril.  Will attempt Afrin for management.  No evidence of posterior nosebleed. Repeat assessment reveals bleeding mostly well-controlled any position changes above his head causes bleeding to begin briskly again.  Will attempt TXA soaked Rhino Rocket for packing. Rhino Rocket placed without significant discomfort. Tolerated well. Bleeding well controlled. Observed to see if bleeding would reccur with no bleeding seen. Advised need for ENT follow up for removal and reassessment. Given today is Thursday, advised to call ENT tomorrow to schedule follow up. As patient may have packing in place for 5 days, I have started him on antibiotics to reduce risk of infection. Return precautions advised. Discharged home in stable condition.   Social Determinants of Health:  None  Final diagnoses:  Epistaxis    ED Discharge Orders          Ordered    amoxicillin -clavulanate (AUGMENTIN ) 875-125 MG tablet  Every 12 hours        11/23/24 1759               Hildur Bayer A, PA-C 11/23/24 1909    Zammit, Joseph, MD 11/26/24 1040  "

## 2024-11-23 NOTE — ED Triage Notes (Signed)
 PT arrives via EMS from home with complaints of epistaxis that began spontaneously today while watching TV. PT takes Eliquis . Denies falls/Trauma.

## 2024-11-24 ENCOUNTER — Telehealth: Payer: Self-pay | Admitting: Internal Medicine

## 2024-11-24 NOTE — Telephone Encounter (Signed)
 Scheduled patient for next appointment. Called and spoke with the patients wife, she is aware of the appointments.

## 2024-11-26 ENCOUNTER — Ambulatory Visit (HOSPITAL_BASED_OUTPATIENT_CLINIC_OR_DEPARTMENT_OTHER): Admitting: Pulmonary Disease

## 2024-11-26 ENCOUNTER — Ambulatory Visit

## 2024-11-26 DIAGNOSIS — R55 Syncope and collapse: Secondary | ICD-10-CM | POA: Diagnosis not present

## 2024-11-27 ENCOUNTER — Other Ambulatory Visit: Payer: Self-pay

## 2024-11-27 ENCOUNTER — Ambulatory Visit (INDEPENDENT_AMBULATORY_CARE_PROVIDER_SITE_OTHER): Admitting: Otolaryngology

## 2024-11-27 ENCOUNTER — Other Ambulatory Visit (HOSPITAL_COMMUNITY): Payer: Self-pay

## 2024-11-27 ENCOUNTER — Encounter (INDEPENDENT_AMBULATORY_CARE_PROVIDER_SITE_OTHER): Payer: Self-pay | Admitting: Otolaryngology

## 2024-11-27 ENCOUNTER — Encounter: Payer: Self-pay | Admitting: Internal Medicine

## 2024-11-27 VITALS — BP 118/83 | HR 91 | Ht 68.0 in

## 2024-11-27 DIAGNOSIS — Z7901 Long term (current) use of anticoagulants: Secondary | ICD-10-CM | POA: Diagnosis not present

## 2024-11-27 DIAGNOSIS — R040 Epistaxis: Secondary | ICD-10-CM

## 2024-11-27 DIAGNOSIS — J3489 Other specified disorders of nose and nasal sinuses: Secondary | ICD-10-CM

## 2024-11-27 LAB — CUP PACEART REMOTE DEVICE CHECK
Date Time Interrogation Session: 20260117232917
Implantable Pulse Generator Implant Date: 20240111

## 2024-11-27 MED ORDER — MUPIROCIN 2 % EX OINT
1.0000 | TOPICAL_OINTMENT | Freq: Two times a day (BID) | CUTANEOUS | 0 refills | Status: AC
  Filled 2024-11-27 (×2): qty 22, 11d supply, fill #0
  Filled 2024-11-27: qty 22, 30d supply, fill #0
  Filled 2024-11-28: qty 22, 11d supply, fill #0

## 2024-11-27 MED ORDER — SALINE SPRAY 0.65 % NA SOLN
2.0000 | NASAL | 3 refills | Status: DC
  Filled 2024-11-27 (×3): qty 44, 37d supply, fill #0
  Filled 2024-11-28: qty 30, 25d supply, fill #0
  Filled 2024-11-30: qty 44, 30d supply, fill #0

## 2024-11-27 NOTE — Patient Instructions (Signed)
 Instructions to help prevent future episodes of nose bleeds and management: - Reduce nasal manipulation - no nose picking, no tissues in the nose other than for dabbing.  - Frequent nasal saline spray (every 4-6 hours as needed) to keep nose moist. - Can use hudmifier in the bedroom - Mupirocin  ointment: Apply a pea sized amount twice daily just to inside of each nostril using your pinkie finger for 7 days (apply to the nostril part, not the middle part), then pinch your nose for 10 seconds, then stop --- then can use vaseline for any dryness

## 2024-11-27 NOTE — Progress Notes (Signed)
 Dear Dr. Janna, Here is my assessment for our mutual patient, Wayne Molina. Thank you for allowing me the opportunity to care for your patient. Please do not hesitate to contact me should you have any other questions. Sincerely, Dr. Eldora Blanch  Otolaryngology Clinic Note  HISTORY:   Initial visit (11/2024): Discussed the use of AI scribe software for clinical note transcription with the patient, who gave verbal consent to proceed.  History of Present Illness Wayne Molina is a 69 year old male with atrial fibrillation on Eliquis  who presents with recurrent epistaxis.  A few days ago, he developed a significant nosebleed on left while sitting in a recliner, with possible digital trauma from nose picking as the only identified trigger. Bleeding began from the left nasal cavity. This was his first major episode, with one prior minor episode.  In the emergency department he underwent nasal packing on left and was started on antibiotics. Bleeding has resolved He has not used nasal medications or sprays in the past. He reports persistent nasal dryness and crusting. He denies prior nasal procedures, cautery, frequent sinus infections, or allergies or CRS sx. Nose is not an issue generally  He remains on Eliquis  for atrial fibrillation. He does not use tobacco or alcohol.  He is currently using no nasal medications.  AP/AC: Eliquis   Tobacco: not currently  PMHx: Glioblastoma, A-fib, HTN, CVA, Seizures  RADIOGRAPHIC EVALUATION AND INDEPENDENT REVIEW OF OTHER RECORDS:: Dr. Lenor (11/23/2024): noted left epistaxis, packed; Rx: ref to ENT and augmentin  until pack in place CBC 11/23/2024 and CMP 11/14/2023: WBC 12.7, Hgb 15.3, Plt 224; BUN/Cr 26/0.83; LFT wnl [facility] 11/15/2024 independently interpreted with respect to sinuses: paranasal sinuses that are visualized are well aerated and no large nasal masses appreciated Past Medical History:  Diagnosis Date   Anxiety    Arthritis    fingers   Chronic  atrial fibrillation (HCC)    Depression    Family history of breast cancer    Family history of cancer of male genital organ    Family history of gene mutation    BRIP1   Family history of malignant neoplasm of gastrointestinal tract    History of radiation therapy    08-23-24 - 09-12-24 Brain (parietal lobe) Dr. Lauraine Golden, MD   Hyperlipidemia    Hypertension    Personal history of colonic polyps 11/12/2006   Sleep apnea    Past Surgical History:  Procedure Laterality Date   2-D echocardiogram  07/22/2010   Ejection fraction greater than 55%. Left atrium moderately dilated. Right atrium moderately dilated. 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 Mild to Moderate MR. Mild to moderate TR.   APPLICATION OF CRANIAL NAVIGATION Right 07/17/2024   Procedure: COMPUTER-ASSISTED NAVIGATION, FOR CRANIAL PROCEDURE;  Surgeon: Rosslyn Dino HERO, MD;  Location: [facility] OR;  Service: Neurosurgery;  Laterality: Right;  CRANIAL NAVIGATION   ATRIAL ABLATION SURGERY  11/09/2002   EP IMPLANTABLE DEVICE N/A 02/11/2016   Procedure: Loop Recorder Insertion;  Surgeon: Jerel Balding, MD;  Location: [facility] INVASIVE CV LAB;  Service: Cardiovascular;  Laterality: N/A;   Exercise Myoview stress test  07/08/2000   Nonischemic low-risk.   HIP FRACTURE SURGERY  1970s   IR GENERIC HISTORICAL  01/21/2017   IR RADIOLOGIST EVAL & MGMT 01/21/2017 [facility]-INTERV RAD   STERIOTACTIC STIMULATOR INSERTION Right 07/17/2024   Procedure: OPEN CRANIOTOMY FOR BIOPSY;  Surgeon: Rosslyn Dino HERO, MD;  Location: [facility] OR;  Service: Neurosurgery;  Laterality: Right;  RIGHT STEREOTACTIC BRAIN BIOPSY   Family History  Problem  Relation Age of Onset   Dementia Mother    Heart disease Father    Diabetes Father    Other Sister        BRIP1 gene mutation   Kidney disease Brother    Colon cancer Maternal Uncle    Lung cancer Paternal Aunt        d. >50   Cancer Paternal Aunt        unknown type, d. >50   Cancer Cousin 17       gynecologic (paternal  first cousin)   Colon cancer Nephew 27       arising in colon polyp   Cancer Niece 24       gynecologic; niece in her 50's   Breast cancer Niece 71   Other Niece        BRIP1 gene mutation   Cancer Paternal Great-grandmother        abdominal cancer (PGF's mother) great grandmother   Social History   Tobacco Use   Smoking status: Former    Current packs/day: 1.00    Types: Cigarettes   Smokeless tobacco: Never   Tobacco comments:    Smoked for approximately 6 months in his life  Substance Use Topics   Alcohol use: No   Allergies[1] Current Outpatient Medications  Medication Sig Dispense Refill   acetaminophen  (TYLENOL ) 325 MG tablet Take 1-2 tablets (325-650 mg total) by mouth every 4 (four) hours as needed for mild pain (pain score 1-3).     amoxicillin -clavulanate (AUGMENTIN ) 875-125 MG tablet Take 1 tablet by mouth every 12 (twelve) hours. 14 tablet 0   apixaban  (ELIQUIS ) 5 MG TABS tablet Take 1 tablet (5 mg total) by mouth 2 (two) times daily. 60 tablet 0   atorvastatin  (LIPITOR) 40 MG tablet Take 1 tablet (40 mg total) by mouth daily. 30 tablet 0   calcium  carbonate (OSCAL) 1500 (600 Ca) MG TABS tablet Take 1 tablet (1,500 mg total) by mouth 2 (two) times daily with a meal. 60 tablet 2   carvedilol  (COREG ) 12.5 MG tablet Take 1 tablet (12.5 mg total) by mouth 2 (two) times daily. 60 tablet 0   Cyanocobalamin (VITAMIN B-12 PO) Take 1 capsule by mouth daily.     dexamethasone  (DECADRON ) 4 MG tablet Take 1 tablet (4 mg total) by mouth daily. 30 tablet 0   gabapentin  (NEURONTIN ) 100 MG capsule Take 1 capsule (100 mg total) by mouth 3 (three) times daily. 90 capsule 0   levETIRAcetam  (KEPPRA ) 1000 MG tablet Take 1 tablet (1,000 mg total) by mouth 2 (two) times daily. 60 tablet 0   magnesium  oxide (MAG-OX) 400 (240 Mg) MG tablet Take 0.5 tablets (200 mg total) by mouth daily in the afternoon. 30 tablet 0   melatonin 3 MG TABS tablet Take 1 tablet (3 mg total) by mouth at bedtime.  30 tablet 0   Multiple Vitamin (MULTIVITAMIN ADULT PO) Take 1 tablet by mouth daily.     mupirocin  ointment (BACTROBAN ) 2 % Apply 1 Application topically 2 (two) times daily for 7 days. 22 g 0   nystatin  (MYCOSTATIN /NYSTOP ) powder Apply topically 2 (two) times daily.     pantoprazole  (PROTONIX ) 40 MG tablet Take 1 tablet (40 mg total) by mouth daily. 30 tablet 0   polyethylene glycol (MIRALAX  / GLYCOLAX ) 17 g packet Take 17 g by mouth daily.     QUEtiapine  (SEROQUEL ) 25 MG tablet Take 1 tablet (25 mg total) by mouth daily. 30 tablet 0   sodium  chloride (OCEAN) 0.65 % SOLN nasal spray Place 2 sprays into both nostrils every 2 (two) hours. 30 mL 3   tamsulosin  (FLOMAX ) 0.4 MG CAPS capsule Take 1 capsule (0.4 mg total) by mouth daily. 30 capsule 0   temozolomide  (TEMODAR ) 140 MG capsule Take 2 capsules (280 mg total) by mouth daily. Take for 5 days on, 23 days off. Repeat every 28 days. May take on an empty stomach to decrease nausea & vomiting. 10 capsule 0   No current facility-administered medications for this visit.   BP 118/83 (BP Location: Right Arm, Patient Position: Sitting, Cuff Size: Large)   Pulse 91   Ht 5' 8 (1.727 m)   SpO2 96%   BMI 24.47 kg/m   PHYSICAL EXAM:  BP 118/83 (BP Location: Right Arm, Patient Position: Sitting, Cuff Size: Large)   Pulse 91   Ht 5' 8 (1.727 m)   SpO2 96%   BMI 24.47 kg/m    Salient findings:  Bilateral EAC clear and TM intact with well pneumatized middle ear spaces Nose: Pack in place (anterior rhinorocket) on left; hemotstatic; after removal, Anterior rhinoscopy reveals septum relatively midline, quite dry; no active epistaxis; prominent septal vessels b/l, no significant turbinate hypertrophy  Nasal endoscopy was indicated to better evaluate the nose and paranasal sinuses, given the patient's history and exam findings, and is detailed below. No lesions of oral cavity/oropharynx No respiratory distress or stridor   PROCEDURE:  Prior to  initiating any procedures, risks/benefits/alternatives were explained to the patient and verbal consent obtained. Diagnostic Nasal Endoscopy Pre-procedure diagnosis: Concern for epistaxis Post-procedure diagnosis: same Indication: See pre-procedure diagnosis and physical exam above Complications: None apparent EBL: 0 mL Anesthesia: Lidocaine  4% and topical decongestant was topically sprayed in each nasal cavity  Description of Procedure:  Patient was identified. A rigid 30 degree endoscope was utilized to evaluate the sinonasal cavities, mucosa, sinus ostia and turbinates and septum.  Overall, signs of mucosal inflammation are not significantly noted.  Also noted are prominent septal vessels bilaterally with moderately dry mucosa globally.  No mucopurulence, polyps, or masses noted.   Right Middle meatus: clear Right SE Recess: clear Left MM: clear Left SE Recess: clear  CPT CODE -- 31231 - Mod 25   ASSESSMENT:  69 y.o. with:  1. Epistaxis   2. Nasal dryness    On Eliquis ; pack removed, no bleeding; suspect episode due to digital trauma and worse b/c on AP/AC.  PLAN: We've discussed issues and options today.  We reviewed the nasal endoscopy images together.  The risks, benefits and alternatives were discussed and questions answered.  He has elected to proceed with:  1) Humidification measures --- ayr gel, nasal saline spray multiple times per day; humidifier in bedroom 2) stop augmentin ; start mupirocin  BID x7d 3) No digital trauma to nose D/w pt f/u, given lack of hx opted PRN  See below regarding exact medications prescribed this encounter including dosages and route: Meds ordered this encounter  Medications   mupirocin  ointment (BACTROBAN ) 2 %    Sig: Apply 1 Application topically 2 (two) times daily for 7 days.    Dispense:  22 g    Refill:  0   sodium chloride  (OCEAN) 0.65 % SOLN nasal spray    Sig: Place 2 sprays into both nostrils every 2 (two) hours.    Dispense:   30 mL    Refill:  3     Thank you for allowing me the opportunity to care for your patient.  Please do not hesitate to contact me should you have any other questions.  Sincerely, Eldora Blanch, MD Otolaryngologist (ENT), [facility] Phone: [phone] Fax: [phone]  MDM:  [phone] Complexity/Problems addressed: low Data complexity: mod - independent interpretation of CT; review of note, labs - Morbidity: mod  - Drug prescribed or managed: y  11/27/2024, 3:01 PM      [1] No Known Allergies

## 2024-11-28 ENCOUNTER — Encounter: Payer: Self-pay | Admitting: Internal Medicine

## 2024-11-28 ENCOUNTER — Other Ambulatory Visit: Payer: Self-pay

## 2024-11-28 ENCOUNTER — Other Ambulatory Visit (HOSPITAL_COMMUNITY): Payer: Self-pay

## 2024-11-28 NOTE — Progress Notes (Signed)
 Remote Loop Recorder Transmission

## 2024-11-29 ENCOUNTER — Other Ambulatory Visit (HOSPITAL_BASED_OUTPATIENT_CLINIC_OR_DEPARTMENT_OTHER): Payer: Self-pay

## 2024-11-29 ENCOUNTER — Encounter (HOSPITAL_COMMUNITY): Payer: Self-pay

## 2024-11-29 ENCOUNTER — Emergency Department (HOSPITAL_COMMUNITY)

## 2024-11-29 ENCOUNTER — Ambulatory Visit: Payer: Self-pay | Admitting: Cardiovascular Disease

## 2024-11-29 ENCOUNTER — Other Ambulatory Visit (HOSPITAL_COMMUNITY): Payer: Self-pay

## 2024-11-29 ENCOUNTER — Encounter: Payer: Self-pay | Admitting: Internal Medicine

## 2024-11-29 ENCOUNTER — Other Ambulatory Visit: Payer: Self-pay

## 2024-11-29 ENCOUNTER — Emergency Department (HOSPITAL_COMMUNITY)
Admission: EM | Admit: 2024-11-29 | Discharge: 2024-11-29 | Disposition: A | Discharge status: Home / Self Care | Attending: Emergency Medicine | Admitting: Emergency Medicine

## 2024-11-29 DIAGNOSIS — S61412A Laceration without foreign body of left hand, initial encounter: Secondary | ICD-10-CM | POA: Diagnosis not present

## 2024-11-29 DIAGNOSIS — W010XXA Fall on same level from slipping, tripping and stumbling without subsequent striking against object, initial encounter: Secondary | ICD-10-CM | POA: Diagnosis not present

## 2024-11-29 DIAGNOSIS — Z7901 Long term (current) use of anticoagulants: Secondary | ICD-10-CM | POA: Diagnosis not present

## 2024-11-29 DIAGNOSIS — I4891 Unspecified atrial fibrillation: Secondary | ICD-10-CM | POA: Diagnosis not present

## 2024-11-29 DIAGNOSIS — Z85841 Personal history of malignant neoplasm of brain: Secondary | ICD-10-CM | POA: Diagnosis not present

## 2024-11-29 DIAGNOSIS — S6992XA Unspecified injury of left wrist, hand and finger(s), initial encounter: Secondary | ICD-10-CM | POA: Diagnosis present

## 2024-11-29 DIAGNOSIS — S0990XA Unspecified injury of head, initial encounter: Secondary | ICD-10-CM | POA: Insufficient documentation

## 2024-11-29 DIAGNOSIS — W19XXXA Unspecified fall, initial encounter: Secondary | ICD-10-CM

## 2024-11-29 LAB — CBC WITH DIFFERENTIAL/PLATELET
Abs Immature Granulocytes: 0.17 K/uL — ABNORMAL HIGH (ref 0.00–0.07)
Basophils Absolute: 0 K/uL (ref 0.0–0.1)
Basophils Relative: 0 %
Eosinophils Absolute: 0 K/uL (ref 0.0–0.5)
Eosinophils Relative: 0 %
HCT: 41.2 % (ref 39.0–52.0)
Hemoglobin: 14.1 g/dL (ref 13.0–17.0)
Immature Granulocytes: 2 %
Lymphocytes Relative: 16 %
Lymphs Abs: 1.5 K/uL (ref 0.7–4.0)
MCH: 32.2 pg (ref 26.0–34.0)
MCHC: 34.2 g/dL (ref 30.0–36.0)
MCV: 94.1 fL (ref 80.0–100.0)
Monocytes Absolute: 0.5 K/uL (ref 0.1–1.0)
Monocytes Relative: 5 %
Neutro Abs: 7.3 K/uL (ref 1.7–7.7)
Neutrophils Relative %: 77 %
Platelets: 166 K/uL (ref 150–400)
RBC: 4.38 MIL/uL (ref 4.22–5.81)
RDW: 13.3 % (ref 11.5–15.5)
WBC: 9.5 K/uL (ref 4.0–10.5)
nRBC: 0 % (ref 0.0–0.2)

## 2024-11-29 LAB — BASIC METABOLIC PANEL WITH GFR
Anion gap: 10 (ref 5–15)
BUN: 20 mg/dL (ref 8–23)
CO2: 27 mmol/L (ref 22–32)
Calcium: 9.1 mg/dL (ref 8.9–10.3)
Chloride: 101 mmol/L (ref 98–111)
Creatinine, Ser: 0.84 mg/dL (ref 0.61–1.24)
GFR, Estimated: >60 mL/min
Glucose, Bld: 283 mg/dL — ABNORMAL HIGH (ref 70–99)
Potassium: 4.6 mmol/L (ref 3.5–5.1)
Sodium: 138 mmol/L (ref 135–145)

## 2024-11-29 NOTE — ED Notes (Signed)
Pt/family verbalized understanding of discharge instructions.   

## 2024-11-29 NOTE — Progress Notes (Signed)
 Orthopedic Tech Progress Note Patient Details:  Wayne Molina December 23, 1955 [id number]  Level 2 trauma   Patient ID: Wayne Molina, male   DOB: 07-24-1956, 69 y.o.   MRN: [id number]  [name] Pac 11/29/2024, 11:32 AM

## 2024-11-29 NOTE — ED Provider Notes (Signed)
 " [place] EMERGENCY DEPARTMENT AT [facility] Provider Note   CSN: [id number] Arrival date & time: 11/29/24  1127     Patient presents with: Wayne   [name] is a 69 y.o. male.   Patient with mechanical fall yesterday.  Did hit his head he is on blood thinner Eliquis  for A-fib.  He refused transport yesterday.  He had a televisit this morning and they encouraged him to come for head CT which she was amenable to.  He mostly uses a wheelchair to get around but he does get around with a walker at times.  He tripped and fell using his walker.  He got a skin tear to his left hand.  He denies any weakness numbness tingling.  Denies any headache neck pain or extremity pain otherwise.  He did not lose consciousness.  Level 2 trauma enacted by EMS given fall and head trauma on blood thinners.  The history is provided by the patient.       Prior to Admission medications  Medication Sig Start Date End Date Taking? Authorizing Provider  acetaminophen  (TYLENOL ) 325 MG tablet Take 1-2 tablets (325-650 mg total) by mouth every 4 (four) hours as needed for mild pain (pain score 1-3). 08/08/24   Angiulli, Daniel J, PA-C  amoxicillin -clavulanate (AUGMENTIN ) 875-125 MG tablet Take 1 tablet by mouth every 12 (twelve) hours. 11/23/24   Zelaya, Oscar A, PA-C  apixaban  (ELIQUIS ) 5 MG TABS tablet Take 1 tablet (5 mg total) by mouth 2 (two) times daily. 11/16/24   Angiulli, Toribio PARAS, PA-C  atorvastatin  (LIPITOR) 40 MG tablet Take 1 tablet (40 mg total) by mouth daily. 11/16/24   Angiulli, Toribio PARAS, PA-C  calcium  carbonate (OSCAL) 1500 (600 Ca) MG TABS tablet Take 1 tablet (1,500 mg total) by mouth 2 (two) times daily with a meal. 09/08/24   Janna Ferrier, DO  carvedilol  (COREG ) 12.5 MG tablet Take 1 tablet (12.5 mg total) by mouth 2 (two) times daily. 11/16/24   Angiulli, Daniel J, PA-C  Cyanocobalamin (VITAMIN B-12 PO) Take 1 capsule by mouth daily.    [provider]  dexamethasone  (DECADRON )  4 MG tablet Take 1 tablet (4 mg total) by mouth daily. 11/18/24   Angiulli, Toribio PARAS, PA-C  gabapentin  (NEURONTIN ) 100 MG capsule Take 1 capsule (100 mg total) by mouth 3 (three) times daily. 11/16/24   Angiulli, Toribio PARAS, PA-C  levETIRAcetam  (KEPPRA ) 1000 MG tablet Take 1 tablet (1,000 mg total) by mouth 2 (two) times daily. 11/16/24   Angiulli, Toribio PARAS, PA-C  magnesium  oxide (MAG-OX) 400 (240 Mg) MG tablet Take 0.5 tablets (200 mg total) by mouth daily in the afternoon. 11/16/24   Angiulli, Daniel J, PA-C  melatonin 3 MG TABS tablet Take 1 tablet (3 mg total) by mouth at bedtime. 11/16/24   Angiulli, Daniel J, PA-C  Multiple Vitamin (MULTIVITAMIN ADULT PO) Take 1 tablet by mouth daily. 02/07/18   [provider]  mupirocin  ointment (BACTROBAN ) 2 % Apply 1 Application topically 2 (two) times daily for 7 days. 11/27/24 12/09/24  Patel, Kunjan B, MD  nystatin  (MYCOSTATIN /NYSTOP ) powder Apply topically 2 (two) times daily. 11/15/24   Angiulli, Toribio PARAS, PA-C  pantoprazole  (PROTONIX ) 40 MG tablet Take 1 tablet (40 mg total) by mouth daily. 11/16/24   Angiulli, Toribio PARAS, PA-C  polyethylene glycol (MIRALAX  / GLYCOLAX ) 17 g packet Take 17 g by mouth daily.    [provider]  QUEtiapine  (SEROQUEL ) 25 MG tablet Take 1 tablet (  25 mg total) by mouth daily. 11/16/24   Angiulli, Daniel J, PA-C  sodium chloride  (OCEAN) 0.65 % SOLN nasal spray Place 2 sprays into both nostrils every 2 (two) hours. 11/27/24   Tobie Eldora NOVAK, MD  tamsulosin  (FLOMAX ) 0.4 MG CAPS capsule Take 1 capsule (0.4 mg total) by mouth daily. 11/16/24   Angiulli, Toribio PARAS, PA-C  temozolomide  (TEMODAR ) 140 MG capsule Take 2 capsules (280 mg total) by mouth daily. Take for 5 days on, 23 days off. Repeat every 28 days. May take on an empty stomach to decrease nausea & vomiting. 10/09/24   Vaslow, Zachary K, MD    Allergies: Patient has no known allergies.    Review of Systems  Updated Vital Signs BP 126/83   Pulse 89   Temp (!) 97.1 F (36.2  C) (Axillary)   Resp 16   Ht 5' 8 (1.727 m)   Wt 73 kg   SpO2 100%   BMI 24.47 kg/m   Physical Exam Vitals and nursing note reviewed.  Constitutional:      General: He is not in acute distress.    Appearance: He is well-developed. He is not ill-appearing.  HENT:     Head: Normocephalic and atraumatic.     Nose: Nose normal.     Mouth/Throat:     Mouth: Mucous membranes are moist.  Eyes:     Conjunctiva/sclera: Conjunctivae normal.     Pupils: Pupils are equal, round, and reactive to light.  Cardiovascular:     Rate and Rhythm: Normal rate and regular rhythm.     Pulses: Normal pulses.     Heart sounds: Normal heart sounds. No murmur heard. Pulmonary:     Effort: Pulmonary effort is normal. No respiratory distress.     Breath sounds: Normal breath sounds.  Abdominal:     General: Abdomen is flat.     Palpations: Abdomen is soft.     Tenderness: There is no abdominal tenderness.  Musculoskeletal:        General: No swelling. Normal range of motion.     Cervical back: Normal range of motion and neck supple.     Comments: No midline spinal tenderness  Skin:    General: Skin is warm and dry.     Capillary Refill: Capillary refill takes less than 2 seconds.     Comments: Skin tear to the left hand  Neurological:     General: No focal deficit present.     Mental Status: He is alert and oriented to person, place, and time.     Cranial Nerves: No cranial nerve deficit.     Sensory: No sensory deficit.     Motor: No weakness.     Coordination: Coordination normal.     Comments: Normal strength and sensation  Psychiatric:        Mood and Affect: Mood normal.     (all labs ordered are listed, but only abnormal results are displayed) Labs Reviewed  CBC WITH DIFFERENTIAL/PLATELET - Abnormal; Notable for the following components:      Result Value   Abs Immature Granulocytes 0.17 (*)    All other components within normal limits  BASIC METABOLIC PANEL WITH GFR - Abnormal;  Notable for the following components:   Glucose, Bld 283 (*)    All other components within normal limits    EKG: None  Radiology: CT Cervical Spine Wo Contrast Result Date: 11/29/2024 EXAM: CT CERVICAL SPINE WITHOUT CONTRAST 11/29/2024 12:37:33 PM TECHNIQUE: CT of the  cervical spine was performed without the administration of intravenous contrast. Multiplanar reformatted images are provided for review. Automated exposure control, iterative reconstruction, and/or weight based adjustment of the mA/kV was utilized to reduce the radiation dose to as low as reasonably achievable. COMPARISON: CTA neck 07/20/2024. CLINICAL HISTORY: 69 year old male status post fall on blood thinners, history of glioblastoma. FINDINGS: BONES AND ALIGNMENT: Stable straightening of cervical lordosis. No acute fracture or traumatic malalignment. Stable visible upper thoracic levels. DEGENERATIVE CHANGES: Mild for age cervical spine degeneration. SOFT TISSUES: No prevertebral soft tissue swelling. Bulky proximal left ICA calcified atherosclerosis. Otherwise negative visible non-contrast neck soft tissues. LUNGS: Negative visible lung apices. Calcified aortic arch atherosclerosis. IMPRESSION: 1. No acute traumatic injury identified in the cervical spine. 2. Aortic Atherosclerosis (ICD10-I70.0). Electronically signed by: Helayne Hurst MD 11/29/2024 12:54 PM EST RP Workstation: [id number]   CT Head Wo Contrast Result Date: 11/29/2024 EXAM: CT HEAD WITHOUT CONTRAST 11/29/2024 12:37:33 PM TECHNIQUE: CT of the head was performed without the administration of intravenous contrast. Automated exposure control, iterative reconstruction, and/or weight based adjustment of the mA/kV was utilized to reduce the radiation dose to as low as reasonably achievable. COMPARISON: Restaging brain MRI 11/03/2024, non-contrast head CT 11/15/2024. CLINICAL HISTORY: 69 year old male status post fall on blood thinners, also history of glioblastoma. FINDINGS:  BRAIN AND VENTRICLES: Mixed density white hemisphere glioblastoma with posterior periatrial, posterior right temporal horn region involvement appears stable by CT from earlier this month. Mild regional mass effect, stable. Superimposed small chronic cerebellar infarcts. Stable gray white differentiation. No new intracranial mass effect. No midline shift. No acute hemorrhage. No evidence of acute infarct. No hydrocephalus. No extra-axial collection. No suspicious intracranial vascular hyperdensity. Calcified atherosclerosis at the skull base. ORBITS: No acute orbit injury identified. SINUSES: Paranasal sinuses, tympanic cavities, and mastoids are clear. SOFT TISSUES AND SKULL: No acute scalp soft tissue injury identified. Stable chronic right posterior calvarium burr hole. No skull fracture. IMPRESSION: 1. Stable non-contrast CT appearance of right hemisphere glioblastoma. 2. No acute traumatic injury or new intracranial abnormality identified. Electronically signed by: Helayne Hurst MD 11/29/2024 12:51 PM EST RP Workstation: [id number]   DG Hand Complete Left Result Date: 11/29/2024 CLINICAL DATA:  Left hand pain. EXAM: LEFT HAND - COMPLETE 3+ VIEW COMPARISON:  Radiograph dated 08/08/2022 FINDINGS: No acute fracture or dislocation. The bones are osteopenic. No significant arthritic change. The soft tissues are unremarkable. IMPRESSION: 1. No acute fracture or dislocation. 2. Osteopenia. Electronically Signed   By: Vanetta Chou M.D.   On: 11/29/2024 12:32   DG Pelvis Portable Result Date: 11/29/2024 CLINICAL DATA:  Fall.  Patient on blood thinners. EXAM: PORTABLE PELVIS 1-2 VIEWS COMPARISON:  CT 08/27/2024 FINDINGS: Subtle bilateral degenerative change of the hips. No evidence of acute fracture or dislocation. Mild degenerate change of the spine. Stable L4 compression fracture. IMPRESSION: 1. No acute findings. 2. Subtle degenerative change of the hips. Electronically Signed   By: Toribio Agreste M.D.   On:  11/29/2024 11:57   DG Chest Portable 1 View Result Date: 11/29/2024 CLINICAL DATA:  Fall.  Patient on blood thinners. EXAM: PORTABLE CHEST 1 VIEW COMPARISON:  11/10/2024 FINDINGS: Loop recorder present. Lungs are adequately inflated without airspace consolidation, effusion or pneumothorax. Borderline stable cardiomegaly. No acute fracture. IMPRESSION: No acute cardiopulmonary disease. Electronically Signed   By: Toribio Agreste M.D.   On: 11/29/2024 11:55     Procedures   Medications Ordered in the ED - No data to display  Medical Decision Making Amount and/or Complexity of Data Reviewed Labs: ordered. Radiology: ordered.   Wayne Molina is here following mechanical fall last night while he hit his head.  He is on blood thinners.  He refused transport last night with EMS.  Vitals are normal.  He had a televisit today was encouraged to come.  He is doing well otherwise.  No extremity pain.  Will get a CT of his head neck chest x-ray pelvic x-ray and x-ray of his left hand.  Does have a skin tear to his left hand.  Will check some basic labs and reevaluate.  Does not have any major pain at this time.  His neurological exam is normal.  He does have history of A-fib.  Does have history of brain cancer.  He states mechanical fall.  Lab work is unremarkable.  CT scan of the head and neck are unremarkable.  No acute findings.  Stable appearance of glioblastoma.  X-rays show no traumatic process as well.  Patient discharged in good condition.  Understands return precautions.  Discharge.  This chart was dictated using voice recognition software.  Despite best efforts to proofread,  errors can occur which can change the documentation meaning.      Final diagnoses:  Fall, initial encounter    ED Discharge Orders     None          Ruthe Cornet, DO 11/29/24 1410  "

## 2024-11-29 NOTE — ED Triage Notes (Signed)
 Pt commiong in from home. Pt is alert and oriented x2 at baseline. Pt currently alert and oriented x2 . Pt fell last night 1/20 at 5pm and hit his head on a carpeted surface. No loc. Pt takes elequis for afib.   Ems vitals  126/88 Hr 84  97% on ra

## 2024-11-29 NOTE — Progress Notes (Signed)
 SPIRITUAL CARE AND COUNSELING CONSULT NOTE   VISIT SUMMARY Chaplain responded to [facility] page. Wife bedside. Pt Wayne Molina and his wife expressed gratitude for chaplain presence but politely stated that they felt fully supported already. No spiritual care services needed at this time. Chaplains continue to remain available.   SPIRITUAL ENCOUNTER                                                                                                                                                                      Type of Visit: Initial Care provided to:: Pt and family Conversation partners present during encounter: Nurse Referral source: Trauma page Reason for visit: Trauma OnCall Visit: No  INTERVENTIONS   Spiritual Care Interventions Made: Established relationship of care and support, Compassionate presence   INTERVENTION OUTCOMES   Outcomes: Connection to spiritual care, Awareness around self/spiritual resourses, Awareness of support  If immediate needs arise, please contact [place] 24 hour on call [phone]   Donnice JINNY Shuck, Chaplain  11/29/2024 1:59 PM

## 2024-11-30 ENCOUNTER — Other Ambulatory Visit (HOSPITAL_COMMUNITY): Payer: Self-pay

## 2024-11-30 ENCOUNTER — Encounter: Payer: Self-pay | Admitting: Internal Medicine

## 2024-11-30 ENCOUNTER — Other Ambulatory Visit: Payer: Self-pay

## 2024-12-05 ENCOUNTER — Telehealth: Payer: Self-pay

## 2024-12-05 ENCOUNTER — Telehealth: Payer: Self-pay | Admitting: *Deleted

## 2024-12-05 NOTE — Telephone Encounter (Signed)
 Pt's wife advised of recommendations and agreed to the plan of care.  She said she would call back on Friday morning with an update and if no improvement will take him to the ED

## 2024-12-05 NOTE — Telephone Encounter (Signed)
 T/C from pt's wife stating pt is having a really hard time.  He can no longer walk. It is hard for him to even move from the bed to a bedside commode. He is barely able to speak.  She said there is no way he can make it to his MRI appt on Friday or his f/up appt on Monday.  She is asking for assistance and recommendations.  Please advise

## 2024-12-05 NOTE — Telephone Encounter (Signed)
 Copied from CRM [id number]. Topic: Clinical - Home Health Verbal Orders >> Dec 01, 2024  1:58 PM Diannia DEL wrote: Caller/Agency: [facility] Callback Number: [phone] Option 1 Ext [phone] Service Requested: Physical Therapy, Home Health Aide Frequency: She states that would be determine by the doctor Any new concerns about the patient? No

## 2024-12-07 ENCOUNTER — Encounter: Payer: Self-pay | Admitting: Internal Medicine

## 2024-12-07 ENCOUNTER — Other Ambulatory Visit (HOSPITAL_COMMUNITY): Payer: Self-pay

## 2024-12-08 ENCOUNTER — Ambulatory Visit (HOSPITAL_COMMUNITY)
Admission: RE | Admit: 2024-12-08 | Discharge: 2024-12-08 | Disposition: A | Source: Ambulatory Visit | Attending: Internal Medicine

## 2024-12-08 ENCOUNTER — Other Ambulatory Visit (HOSPITAL_COMMUNITY): Payer: Self-pay

## 2024-12-08 ENCOUNTER — Telehealth: Payer: Self-pay | Admitting: *Deleted

## 2024-12-08 ENCOUNTER — Other Ambulatory Visit: Payer: Self-pay

## 2024-12-08 ENCOUNTER — Encounter: Payer: Self-pay | Admitting: Internal Medicine

## 2024-12-08 ENCOUNTER — Other Ambulatory Visit: Payer: Self-pay | Admitting: Internal Medicine

## 2024-12-08 DIAGNOSIS — C719 Malignant neoplasm of brain, unspecified: Secondary | ICD-10-CM | POA: Diagnosis present

## 2024-12-08 MED ORDER — DEXAMETHASONE 4 MG PO TABS
4.0000 mg | ORAL_TABLET | Freq: Four times a day (QID) | ORAL | 0 refills | Status: DC
  Filled 2024-12-08 (×2): qty 60, 15d supply, fill #0

## 2024-12-08 MED ORDER — GADOBUTROL 1 MMOL/ML IV SOLN
7.0000 mL | Freq: Once | INTRAVENOUS | Status: AC | PRN
  Administered 2024-12-08: 7 mL via INTRAVENOUS

## 2024-12-08 NOTE — Telephone Encounter (Signed)
 Contacted by Ms. Lowenthal about Mr Wambolt's decadron  refill. Advised her that Dr. [name] had sent in rx to [facility] OP for Decadron  4mg , take 4 times a day. She stated she would pick that up today as he was almost out of medication since increasing it.  Ms. Peel reports patient continues weak and is still not bearing much weight when he transfers from wheelchair to any other seat.  She said today his brother had to come help with lifting him into the car coming to [facility] for his MRI today and it was very difficult even with help.   Ms. Kottke also said they were informed during discharge from [facility] on 11/17/24 that they would be contacted by [facility] to set up PT/OT/Nursing once he was home. She said no one from [place] has contacted them.  Noted in his discharge that R. Dupree, LCSW provided discharge info to them regarding Home Health.  Ms. Pagliarulo asked if this office would contact Ms. Dupree for update regarding home health.  Staff message sent to SW who assisted with Mr. [facility] discharge with request to contact [facility] on his behalf to get the home health process started and/or contact office with update.  Ms.Maclellan said they plan to come to his appts on Monday for Lab/Dr. [name]. If they are not able to come due to poor driving conditions r/t weather, she will contact office.

## 2024-12-10 ENCOUNTER — Other Ambulatory Visit (HOSPITAL_COMMUNITY): Payer: Self-pay

## 2024-12-11 ENCOUNTER — Inpatient Hospital Stay: Admitting: Internal Medicine

## 2024-12-11 ENCOUNTER — Other Ambulatory Visit: Payer: Self-pay

## 2024-12-11 ENCOUNTER — Other Ambulatory Visit (HOSPITAL_COMMUNITY): Payer: Self-pay

## 2024-12-11 ENCOUNTER — Inpatient Hospital Stay

## 2024-12-12 ENCOUNTER — Other Ambulatory Visit: Payer: Self-pay

## 2024-12-12 ENCOUNTER — Encounter: Payer: Self-pay | Admitting: Internal Medicine

## 2024-12-12 ENCOUNTER — Encounter: Payer: Self-pay | Admitting: Nurse Practitioner

## 2024-12-12 ENCOUNTER — Inpatient Hospital Stay: Admitting: Internal Medicine

## 2024-12-12 ENCOUNTER — Inpatient Hospital Stay: Attending: Internal Medicine | Admitting: Nurse Practitioner

## 2024-12-12 DIAGNOSIS — R530 Neoplastic (malignant) related fatigue: Secondary | ICD-10-CM

## 2024-12-12 DIAGNOSIS — C719 Malignant neoplasm of brain, unspecified: Secondary | ICD-10-CM

## 2024-12-12 DIAGNOSIS — Z7189 Other specified counseling: Secondary | ICD-10-CM

## 2024-12-12 DIAGNOSIS — R630 Anorexia: Secondary | ICD-10-CM

## 2024-12-12 DIAGNOSIS — Z515 Encounter for palliative care: Secondary | ICD-10-CM

## 2024-12-12 DIAGNOSIS — R531 Weakness: Secondary | ICD-10-CM

## 2024-12-12 MED ORDER — DEXAMETHASONE 4 MG PO TABS: 4.0000 mg | ORAL_TABLET | Freq: Two times a day (BID) | ORAL | Status: DC

## 2024-12-13 ENCOUNTER — Other Ambulatory Visit (HOSPITAL_COMMUNITY): Payer: Self-pay

## 2024-12-13 ENCOUNTER — Other Ambulatory Visit: Payer: Self-pay

## 2024-12-13 ENCOUNTER — Inpatient Hospital Stay (HOSPITAL_COMMUNITY)

## 2024-12-13 ENCOUNTER — Encounter (HOSPITAL_COMMUNITY): Payer: Self-pay

## 2024-12-13 ENCOUNTER — Inpatient Hospital Stay (HOSPITAL_COMMUNITY)
Admission: EM | Admit: 2024-12-13 | Discharge: 2025-01-07 | Disposition: E | Source: Home / Self Care | Attending: Family Medicine | Admitting: Family Medicine

## 2024-12-13 ENCOUNTER — Telehealth (HOSPITAL_COMMUNITY): Payer: Self-pay | Admitting: Pharmacy Technician

## 2024-12-13 DIAGNOSIS — R569 Unspecified convulsions: Secondary | ICD-10-CM

## 2024-12-13 DIAGNOSIS — C719 Malignant neoplasm of brain, unspecified: Secondary | ICD-10-CM | POA: Diagnosis present

## 2024-12-13 DIAGNOSIS — R531 Weakness: Secondary | ICD-10-CM

## 2024-12-13 DIAGNOSIS — R519 Headache, unspecified: Secondary | ICD-10-CM | POA: Diagnosis present

## 2024-12-13 DIAGNOSIS — E1310 Other specified diabetes mellitus with ketoacidosis without coma: Secondary | ICD-10-CM

## 2024-12-13 DIAGNOSIS — G9341 Metabolic encephalopathy: Secondary | ICD-10-CM | POA: Diagnosis present

## 2024-12-13 DIAGNOSIS — I4891 Unspecified atrial fibrillation: Secondary | ICD-10-CM | POA: Diagnosis present

## 2024-12-13 DIAGNOSIS — I10 Essential (primary) hypertension: Secondary | ICD-10-CM | POA: Diagnosis present

## 2024-12-13 DIAGNOSIS — Z789 Other specified health status: Secondary | ICD-10-CM

## 2024-12-13 DIAGNOSIS — E0810 Diabetes mellitus due to underlying condition with ketoacidosis without coma: Secondary | ICD-10-CM | POA: Diagnosis not present

## 2024-12-13 DIAGNOSIS — E1110 Type 2 diabetes mellitus with ketoacidosis without coma: Secondary | ICD-10-CM | POA: Diagnosis present

## 2024-12-13 DIAGNOSIS — Z515 Encounter for palliative care: Secondary | ICD-10-CM

## 2024-12-13 DIAGNOSIS — R040 Epistaxis: Principal | ICD-10-CM | POA: Diagnosis present

## 2024-12-13 DIAGNOSIS — N401 Enlarged prostate with lower urinary tract symptoms: Secondary | ICD-10-CM | POA: Diagnosis present

## 2024-12-13 DIAGNOSIS — R4182 Altered mental status, unspecified: Secondary | ICD-10-CM | POA: Diagnosis present

## 2024-12-13 DIAGNOSIS — E785 Hyperlipidemia, unspecified: Secondary | ICD-10-CM | POA: Diagnosis present

## 2024-12-13 LAB — CBC WITH DIFFERENTIAL/PLATELET
Abs Immature Granulocytes: 0.16 10*3/uL — ABNORMAL HIGH (ref 0.00–0.07)
Abs Immature Granulocytes: 0.24 10*3/uL — ABNORMAL HIGH (ref 0.00–0.07)
Basophils Absolute: 0 10*3/uL (ref 0.0–0.1)
Basophils Absolute: 0 10*3/uL (ref 0.0–0.1)
Basophils Relative: 0 %
Basophils Relative: 0 %
Eosinophils Absolute: 0 10*3/uL (ref 0.0–0.5)
Eosinophils Absolute: 0 10*3/uL (ref 0.0–0.5)
Eosinophils Relative: 0 %
Eosinophils Relative: 0 %
HCT: 41.5 % (ref 39.0–52.0)
HCT: 36.3 % — ABNORMAL LOW (ref 39.0–52.0)
Hemoglobin: 14.6 g/dL (ref 13.0–17.0)
Hemoglobin: 12.9 g/dL — ABNORMAL LOW (ref 13.0–17.0)
Immature Granulocytes: 1 %
Immature Granulocytes: 2 %
Lymphocytes Relative: 2 %
Lymphocytes Relative: 4 %
Lymphs Abs: 0.3 10*3/uL — ABNORMAL LOW (ref 0.7–4.0)
Lymphs Abs: 0.6 10*3/uL — ABNORMAL LOW (ref 0.7–4.0)
MCH: 33 pg (ref 26.0–34.0)
MCH: 32.7 pg (ref 26.0–34.0)
MCHC: 35.2 g/dL (ref 30.0–36.0)
MCHC: 35.5 g/dL (ref 30.0–36.0)
MCV: 93.7 fL (ref 80.0–100.0)
MCV: 91.9 fL (ref 80.0–100.0)
Monocytes Absolute: 0.5 10*3/uL (ref 0.1–1.0)
Monocytes Absolute: 0.6 10*3/uL (ref 0.1–1.0)
Monocytes Relative: 4 %
Monocytes Relative: 5 %
Neutro Abs: 11.4 10*3/uL — ABNORMAL HIGH (ref 1.7–7.7)
Neutro Abs: 11.4 10*3/uL — ABNORMAL HIGH (ref 1.7–7.7)
Neutrophils Relative %: 93 %
Neutrophils Relative %: 89 %
Platelets: 233 10*3/uL (ref 150–400)
Platelets: 216 10*3/uL (ref 150–400)
RBC: 4.43 MIL/uL (ref 4.22–5.81)
RBC: 3.95 MIL/uL — ABNORMAL LOW (ref 4.22–5.81)
RDW: 13.5 % (ref 11.5–15.5)
RDW: 13.4 % (ref 11.5–15.5)
WBC: 12.4 10*3/uL — ABNORMAL HIGH (ref 4.0–10.5)
WBC: 12.8 10*3/uL — ABNORMAL HIGH (ref 4.0–10.5)
nRBC: 0.2 % (ref 0.0–0.2)
nRBC: 0.7 % — ABNORMAL HIGH (ref 0.0–0.2)

## 2024-12-13 LAB — I-STAT VENOUS BLOOD GAS, ED
Acid-base deficit: 5 mmol/L — ABNORMAL HIGH (ref 0.0–2.0)
Acid-base deficit: 2 mmol/L (ref 0.0–2.0)
Bicarbonate: 17.1 mmol/L — ABNORMAL LOW (ref 20.0–28.0)
Bicarbonate: 21.3 mmol/L (ref 20.0–28.0)
Calcium, Ion: 0.98 mmol/L — ABNORMAL LOW (ref 1.15–1.40)
Calcium, Ion: 1.04 mmol/L — ABNORMAL LOW (ref 1.15–1.40)
HCT: 37 % — ABNORMAL LOW (ref 39.0–52.0)
HCT: 35 % — ABNORMAL LOW (ref 39.0–52.0)
Hemoglobin: 12.6 g/dL — ABNORMAL LOW (ref 13.0–17.0)
Hemoglobin: 11.9 g/dL — ABNORMAL LOW (ref 13.0–17.0)
O2 Saturation: 69 %
O2 Saturation: 74 %
Potassium: 4.6 mmol/L (ref 3.5–5.1)
Potassium: 4.2 mmol/L (ref 3.5–5.1)
Sodium: 135 mmol/L (ref 135–145)
Sodium: 138 mmol/L (ref 135–145)
TCO2: 18 mmol/L — ABNORMAL LOW (ref 22–32)
TCO2: 22 mmol/L (ref 22–32)
pCO2, Ven: 24.8 mmHg — ABNORMAL LOW (ref 44–60)
pCO2, Ven: 30.3 mmHg — ABNORMAL LOW (ref 44–60)
pH, Ven: 7.446 — ABNORMAL HIGH (ref 7.25–7.43)
pH, Ven: 7.455 — ABNORMAL HIGH (ref 7.25–7.43)
pO2, Ven: 34 mmHg (ref 32–45)
pO2, Ven: 36 mmHg (ref 32–45)

## 2024-12-13 LAB — CBG MONITORING, ED
Glucose-Capillary: 479 mg/dL — ABNORMAL HIGH (ref 70–99)
Glucose-Capillary: 446 mg/dL — ABNORMAL HIGH (ref 70–99)
Glucose-Capillary: 313 mg/dL — ABNORMAL HIGH (ref 70–99)
Glucose-Capillary: 215 mg/dL — ABNORMAL HIGH (ref 70–99)
Glucose-Capillary: 203 mg/dL — ABNORMAL HIGH (ref 70–99)
Glucose-Capillary: 203 mg/dL — ABNORMAL HIGH (ref 70–99)
Glucose-Capillary: 200 mg/dL — ABNORMAL HIGH (ref 70–99)
Glucose-Capillary: 183 mg/dL — ABNORMAL HIGH (ref 70–99)
Glucose-Capillary: 274 mg/dL — ABNORMAL HIGH (ref 70–99)

## 2024-12-13 LAB — BASIC METABOLIC PANEL WITH GFR
Anion gap: 18 — ABNORMAL HIGH (ref 5–15)
Anion gap: 14 (ref 5–15)
Anion gap: 10 (ref 5–15)
BUN: 39 mg/dL — ABNORMAL HIGH (ref 8–23)
BUN: 42 mg/dL — ABNORMAL HIGH (ref 8–23)
BUN: 43 mg/dL — ABNORMAL HIGH (ref 8–23)
CO2: 18 mmol/L — ABNORMAL LOW (ref 22–32)
CO2: 19 mmol/L — ABNORMAL LOW (ref 22–32)
CO2: 22 mmol/L (ref 22–32)
Calcium: 8.7 mg/dL — ABNORMAL LOW (ref 8.9–10.3)
Calcium: 8.4 mg/dL — ABNORMAL LOW (ref 8.9–10.3)
Calcium: 8.4 mg/dL — ABNORMAL LOW (ref 8.9–10.3)
Chloride: 100 mmol/L (ref 98–111)
Chloride: 105 mmol/L (ref 98–111)
Chloride: 104 mmol/L (ref 98–111)
Creatinine, Ser: 0.79 mg/dL (ref 0.61–1.24)
Creatinine, Ser: 0.63 mg/dL (ref 0.61–1.24)
Creatinine, Ser: 0.7 mg/dL (ref 0.61–1.24)
GFR, Estimated: >60 mL/min
GFR, Estimated: >60 mL/min
GFR, Estimated: >60 mL/min
Glucose, Bld: 487 mg/dL — ABNORMAL HIGH (ref 70–99)
Glucose, Bld: 233 mg/dL — ABNORMAL HIGH (ref 70–99)
Glucose, Bld: 256 mg/dL — ABNORMAL HIGH (ref 70–99)
Potassium: 5 mmol/L (ref 3.5–5.1)
Potassium: 4.4 mmol/L (ref 3.5–5.1)
Potassium: 4.9 mmol/L (ref 3.5–5.1)
Sodium: 135 mmol/L (ref 135–145)
Sodium: 138 mmol/L (ref 135–145)
Sodium: 136 mmol/L (ref 135–145)

## 2024-12-13 LAB — TYPE AND SCREEN
ABO/RH(D): A POS
Antibody Screen: NEGATIVE

## 2024-12-13 LAB — ABO/RH: ABO/RH(D): A POS

## 2024-12-13 LAB — BETA-HYDROXYBUTYRIC ACID
Beta-Hydroxybutyric Acid: 4.38 mmol/L — ABNORMAL HIGH (ref 0.05–0.27)
Beta-Hydroxybutyric Acid: 1.11 mmol/L — ABNORMAL HIGH (ref 0.05–0.27)
Beta-Hydroxybutyric Acid: 0.33 mmol/L — ABNORMAL HIGH (ref 0.05–0.27)

## 2024-12-13 LAB — MAGNESIUM: Magnesium: 2.1 mg/dL (ref 1.7–2.4)

## 2024-12-13 LAB — PHOSPHORUS: Phosphorus: 1.7 mg/dL — ABNORMAL LOW (ref 2.5–4.6)

## 2024-12-13 LAB — PROTIME-INR
INR: 1.2 (ref 0.8–1.2)
Prothrombin Time: 15.8 s — ABNORMAL HIGH (ref 11.4–15.2)

## 2024-12-13 LAB — OSMOLALITY: Osmolality: 326 mosm/kg (ref 275–295)

## 2024-12-13 LAB — PRO BRAIN NATRIURETIC PEPTIDE: Pro Brain Natriuretic Peptide: 1216 pg/mL — ABNORMAL HIGH

## 2024-12-13 MED ORDER — DILTIAZEM HCL-DEXTROSE 125-5 MG/125ML-% IV SOLN (PREMIX)
5.0000 mg/h | INTRAVENOUS | Status: DC
  Administered 2024-12-13: 5 mg/h via INTRAVENOUS
  Filled 2024-12-13 (×2): qty 125

## 2024-12-13 MED ORDER — DEXAMETHASONE 4 MG PO TABS: 2.0000 mg | ORAL_TABLET | Freq: Two times a day (BID) | ORAL | Status: DC

## 2024-12-13 MED ORDER — CARVEDILOL 12.5 MG PO TABS: 12.5000 mg | ORAL_TABLET | Freq: Two times a day (BID) | ORAL | Status: DC

## 2024-12-13 MED ORDER — INSULIN ASPART 100 UNIT/ML IJ SOLN: 0.0000 [IU] | Freq: Three times a day (TID) | INTRAMUSCULAR | Status: DC

## 2024-12-13 MED ORDER — DEXAMETHASONE SODIUM PHOSPHATE 4 MG/ML IJ SOLN
4.0000 mg | Freq: Two times a day (BID) | INTRAMUSCULAR | Status: DC
  Administered 2024-12-13: 4 mg via INTRAVENOUS
  Filled 2024-12-13: qty 1

## 2024-12-13 MED ORDER — INSULIN ASPART 100 UNIT/ML IJ SOLN
0.0000 [IU] | Freq: Three times a day (TID) | INTRAMUSCULAR | Status: DC
  Administered 2024-12-13: 11 [IU] via SUBCUTANEOUS
  Administered 2024-12-14: 4 [IU] via SUBCUTANEOUS
  Filled 2024-12-13: qty 4
  Filled 2024-12-13: qty 11

## 2024-12-13 MED ORDER — ONDANSETRON HCL 4 MG PO TABS: 4.0000 mg | ORAL_TABLET | Freq: Four times a day (QID) | ORAL | Status: DC | PRN

## 2024-12-13 MED ORDER — DEXTROSE 50 % IV SOLN: 0.0000 mL | INTRAVENOUS | Status: DC | PRN

## 2024-12-13 MED ORDER — GABAPENTIN 100 MG PO CAPS
100.0000 mg | ORAL_CAPSULE | Freq: Three times a day (TID) | ORAL | Status: DC
  Administered 2024-12-13: 100 mg via ORAL
  Filled 2024-12-13: qty 1

## 2024-12-13 MED ORDER — DEXTROSE IN LACTATED RINGERS 5 % IV SOLN: INTRAVENOUS | Status: DC

## 2024-12-13 MED ORDER — APIXABAN 5 MG PO TABS: 5.0000 mg | ORAL_TABLET | Freq: Two times a day (BID) | ORAL | Status: DC

## 2024-12-13 MED ORDER — PANTOPRAZOLE SODIUM 40 MG PO TBEC
40.0000 mg | DELAYED_RELEASE_TABLET | Freq: Every day | ORAL | Status: DC
  Administered 2024-12-13: 40 mg via ORAL
  Filled 2024-12-13: qty 1

## 2024-12-13 MED ORDER — INSULIN STARTER KIT- PEN NEEDLES (ENGLISH)
1.0000 | Freq: Once | Status: AC
  Administered 2024-12-13: 1
  Filled 2024-12-13: qty 1

## 2024-12-13 MED ORDER — LIVING WELL WITH DIABETES BOOK
Freq: Once | Status: AC
  Filled 2024-12-13: qty 1

## 2024-12-13 MED ORDER — INSULIN REGULAR(HUMAN) IN NACL 100-0.9 UT/100ML-% IV SOLN
INTRAVENOUS | Status: DC
  Administered 2024-12-13: 9 [IU]/h via INTRAVENOUS

## 2024-12-13 MED ORDER — SODIUM CHLORIDE 0.9% FLUSH: 3.0000 mL | Freq: Two times a day (BID) | INTRAVENOUS | Status: DC

## 2024-12-13 MED ORDER — LEVETIRACETAM 500 MG PO TABS
1000.0000 mg | ORAL_TABLET | Freq: Two times a day (BID) | ORAL | Status: DC
  Administered 2024-12-13: 1000 mg via ORAL
  Filled 2024-12-13: qty 2

## 2024-12-13 MED ORDER — LACTATED RINGERS IV SOLN: INTRAVENOUS | Status: DC

## 2024-12-13 MED ORDER — ATORVASTATIN CALCIUM 40 MG PO TABS: 40.0000 mg | ORAL_TABLET | Freq: Every day | ORAL | Status: DC

## 2024-12-13 MED ORDER — TAMSULOSIN HCL 0.4 MG PO CAPS
0.4000 mg | ORAL_CAPSULE | Freq: Every day | ORAL | Status: DC
  Administered 2024-12-13: 0.4 mg via ORAL
  Filled 2024-12-13: qty 1

## 2024-12-13 MED ORDER — POLYETHYLENE GLYCOL 3350 17 G PO PACK
17.0000 g | PACK | Freq: Every day | ORAL | Status: DC
  Administered 2024-12-13: 17 g via ORAL
  Filled 2024-12-13: qty 1

## 2024-12-13 MED ORDER — HYDRALAZINE HCL 20 MG/ML IJ SOLN: 10.0000 mg | INTRAMUSCULAR | Status: DC | PRN

## 2024-12-13 MED ORDER — SENNOSIDES-DOCUSATE SODIUM 8.6-50 MG PO TABS: 1.0000 | ORAL_TABLET | Freq: Every evening | ORAL | Status: DC | PRN

## 2024-12-13 MED ORDER — CARVEDILOL 12.5 MG PO TABS
25.0000 mg | ORAL_TABLET | Freq: Two times a day (BID) | ORAL | Status: DC
  Administered 2024-12-13: 25 mg via ORAL
  Filled 2024-12-13: qty 2

## 2024-12-13 MED ORDER — DEXAMETHASONE 4 MG PO TABS: 4.0000 mg | ORAL_TABLET | Freq: Two times a day (BID) | ORAL | Status: DC

## 2024-12-13 MED ORDER — INSULIN GLARGINE-YFGN 100 UNIT/ML ~~LOC~~ SOLN
20.0000 [IU] | Freq: Every day | SUBCUTANEOUS | Status: DC
  Filled 2024-12-13: qty 0.2

## 2024-12-13 MED ORDER — OXYCODONE HCL 5 MG PO TABS: 5.0000 mg | ORAL_TABLET | ORAL | Status: DC | PRN

## 2024-12-13 MED ORDER — FLEET ENEMA RE ENEM: 1.0000 | ENEMA | Freq: Once | RECTAL | Status: DC | PRN

## 2024-12-13 MED ORDER — FUROSEMIDE 10 MG/ML IJ SOLN: 40.0000 mg | Freq: Once | INTRAMUSCULAR | Status: DC

## 2024-12-13 MED ORDER — ACETAMINOPHEN 325 MG PO TABS: 650.0000 mg | ORAL_TABLET | Freq: Four times a day (QID) | ORAL | Status: DC | PRN

## 2024-12-13 MED ORDER — POTASSIUM CHLORIDE 10 MEQ/100ML IV SOLN
10.0000 meq | INTRAVENOUS | Status: AC
  Administered 2024-12-13 (×2): 10 meq via INTRAVENOUS
  Filled 2024-12-13: qty 100

## 2024-12-13 MED ORDER — MAGNESIUM OXIDE -MG SUPPLEMENT 400 (240 MG) MG PO TABS: 200.0000 mg | ORAL_TABLET | Freq: Every day | ORAL | Status: DC

## 2024-12-13 MED ORDER — GUAIFENESIN 100 MG/5ML PO LIQD
10.0000 mL | Freq: Three times a day (TID) | ORAL | Status: DC
  Administered 2024-12-13: 10 mL via ORAL
  Filled 2024-12-13: qty 10

## 2024-12-13 MED ORDER — OXYMETAZOLINE HCL 0.05 % NA SOLN
1.0000 | Freq: Once | NASAL | Status: AC
  Administered 2024-12-13: 1 via NASAL
  Filled 2024-12-13: qty 15

## 2024-12-13 MED ORDER — QUETIAPINE FUMARATE 25 MG PO TABS: 25.0000 mg | ORAL_TABLET | Freq: Every day | ORAL | Status: DC

## 2024-12-13 MED ORDER — TEMOZOLOMIDE 5 MG PO CAPS: 150.0000 mg/m2/d | ORAL_CAPSULE | Freq: Every day | ORAL | Status: DC

## 2024-12-13 MED ORDER — CALCIUM CARBONATE 1250 (500 CA) MG PO TABS: 1250.0000 mg | ORAL_TABLET | Freq: Two times a day (BID) | ORAL | Status: DC

## 2024-12-13 MED ORDER — INSULIN REGULAR(HUMAN) IN NACL 100-0.9 UT/100ML-% IV SOLN
INTRAVENOUS | Status: DC
  Administered 2024-12-13: 10.5 [IU]/h via INTRAVENOUS
  Filled 2024-12-13: qty 100

## 2024-12-13 MED ORDER — HEPARIN SODIUM (PORCINE) 5000 UNIT/ML IJ SOLN: 5000.0000 [IU] | Freq: Three times a day (TID) | INTRAMUSCULAR | Status: DC

## 2024-12-13 MED ORDER — IPRATROPIUM BROMIDE 0.02 % IN SOLN: 0.5000 mg | Freq: Four times a day (QID) | RESPIRATORY_TRACT | Status: DC | PRN

## 2024-12-13 MED ORDER — ACETAMINOPHEN 650 MG RE SUPP
650.0000 mg | Freq: Four times a day (QID) | RECTAL | Status: DC | PRN
  Administered 2024-12-14: 650 mg via RECTAL
  Filled 2024-12-13: qty 1

## 2024-12-13 MED ORDER — ONDANSETRON HCL 4 MG/2ML IJ SOLN: 4.0000 mg | Freq: Four times a day (QID) | INTRAMUSCULAR | Status: DC | PRN

## 2024-12-13 MED ORDER — AMOXICILLIN-POT CLAVULANATE 875-125 MG PO TABS: 1.0000 | ORAL_TABLET | Freq: Two times a day (BID) | ORAL | Status: DC

## 2024-12-13 MED ORDER — INSULIN GLARGINE-YFGN 100 UNIT/ML ~~LOC~~ SOLN
20.0000 [IU] | Freq: Every day | SUBCUTANEOUS | Status: DC
  Administered 2024-12-13: 20 [IU] via SUBCUTANEOUS
  Filled 2024-12-13 (×2): qty 0.2

## 2024-12-13 MED ORDER — SENNOSIDES-DOCUSATE SODIUM 8.6-50 MG PO TABS: 1.0000 | ORAL_TABLET | Freq: Every day | ORAL | Status: DC

## 2024-12-13 MED ORDER — OXYMETAZOLINE HCL 0.05 % NA SOLN
2.0000 | Freq: Two times a day (BID) | NASAL | Status: DC
  Administered 2024-12-13 – 2024-12-14 (×3): 2 via NASAL
  Filled 2024-12-13 (×2): qty 30

## 2024-12-13 MED ORDER — TRAZODONE HCL 50 MG PO TABS: 25.0000 mg | ORAL_TABLET | Freq: Every evening | ORAL | Status: DC | PRN

## 2024-12-13 MED ORDER — MELATONIN 5 MG PO TABS: 5.0000 mg | ORAL_TABLET | Freq: Every day | ORAL | Status: DC

## 2024-12-13 MED ORDER — HYDROMORPHONE HCL 1 MG/ML IJ SOLN
0.5000 mg | INTRAMUSCULAR | Status: DC | PRN
  Administered 2024-12-13 – 2024-12-14 (×2): 0.5 mg via INTRAVENOUS
  Filled 2024-12-13 (×2): qty 1

## 2024-12-13 MED ORDER — DEXAMETHASONE 4 MG PO TABS
4.0000 mg | ORAL_TABLET | Freq: Two times a day (BID) | ORAL | Status: DC
  Administered 2024-12-13: 4 mg via ORAL
  Filled 2024-12-13: qty 1

## 2024-12-13 MED ORDER — LACTATED RINGERS IV BOLUS
20.0000 mL/kg | Freq: Once | INTRAVENOUS | Status: AC
  Administered 2024-12-13: 1434 mL via INTRAVENOUS

## 2024-12-13 MED ORDER — LEVETIRACETAM (KEPPRA) 500 MG/5 ML ADULT IV PUSH
1000.0000 mg | Freq: Two times a day (BID) | INTRAVENOUS | Status: DC
  Administered 2024-12-13 – 2024-12-14 (×2): 1000 mg via INTRAVENOUS
  Filled 2024-12-13 (×2): qty 10

## 2024-12-13 MED ORDER — NYSTATIN 100000 UNIT/GM EX POWD
Freq: Two times a day (BID) | CUTANEOUS | Status: DC
  Filled 2024-12-13: qty 15

## 2024-12-13 NOTE — Telephone Encounter (Signed)
 Patient Product/process Development Scientist completed.    The patient is insured through [employer].     Ran test claim for Lantus  Pen and the current 30 day co-pay is $0.00.  Ran test claim for Novolog  FlexPen and the current 30 day co-pay is $0.00.  Ran test claim for Dexcom G7 Sensor and the current 30 day co-pay is $0.00.  Ran test claim for Freestyle Libre 3 Plus Sensor and the current 30 day co-pay is $0.00.   This test claim was processed through [facility]- copay amounts may vary at other pharmacies due to pharmacy/plan contracts, or as the patient moves through the different stages of their insurance plan.     Reyes Sharps, CPHT Pharmacy Technician Patient Advocate Specialist Lead [facility] Patient Advocate Team Direct Number: [phone]  Fax: [phone]

## 2024-12-13 NOTE — Consult Note (Signed)
 "  Cardiology Consultation   Patient ID: Wayne Molina MRN: [id number]; DOB: 05/31/1956  Admit date: 12/13/2024 Date of Consult: 12/13/2024  PCP:  Janna Ferrier, DO   [facility] Providers Cardiologist:  Jerel Balding, MD        Patient Profile: Wayne Molina is a 69 y.o. 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  History of Present Illness: [name] has not undergone cMR evaluation for his apical variant hypertrophic cardiomyopathy 2/2 severe claustrophobia. Thankfully he has not developed HF symptoms prior to this admission. He did have syncope in 2017 prompting loop recorder placement with no concerning arrhthymias noted only nocturnal pauses [3-4s]. No pauses > 4s, so pacemaker evaluation has not been pursued at this time. He had not required any AV nodal blocking agents for his AF 2/2 bradycardia.   Last seen by Dr. Balding 03/2024. At that time did report chest pressure with sexual activity, though improved with sildenafil  use. Plan was to pursue repeat echocardiogram and PET scan for further evaluation. Additionally discussion was had about potentially pursing Inspira device as he is CPAP intolerant, question if there is a component of pHTN contributing to symptoms. Echo was obtained which showed LVEF 65-70% with severe LVH of the apical segment. Mildly enlarged RV, normal function. Bi-atrial enlargement. RAP 3 mmHg. PET was not obtained, as patient was then hosptialized 06/2024 for stroke like symptoms and diagnosed with GBM. He underwent right craniotomy for biopsy of brain tumor 07/17/2024. C/b sezires and  patient is now continued on keppra  and decadron  4 mg BID indefinitely. He was cleared at that time to continue chronic anticoagulation with eliquis  as patient had also developed a DVT during that admission. A repeat echo was obtained that admission which showed LVEF 60-65% though worsening RV which was mildly enlarged with moderately reduced function.  RAP 15 mmHG. He has not been seen by cardiology since. Appears he was started on Coreg  1/26 during rehab.  As for his GBM, he has undergone palliative radiation treatment with reduction in size of the mass. He follows with the palliative care team outpatient who last saw him 12/12/24. Concern for patient's overall health declining with severe functional decline and neurocognitive impairment having more confusion/delusions. High fall risk.  Presented to the ED 2/4 for nose bleed and hyperglycemia.  In the ED:  BP: 149/102 ECG: AF RVR 166 with diffuse TWI, with follow up ECG AF HR 111 with TWI resolution in all but inferior leads [though TWI have been seen on prior ECG]  Patient admitted for DKA suspected to be steroid induced and AF RVR. Patient has been given IV fluids, insulin , and started on IV cardizem . His home coreg  has also been increased to 25 mg BID.  On interview patient shared he is not having chest pain or shortness of breath.  Wife shared she was worried about his functional capacity and that he has been falling more frequently. Noted since last night a productive cough.     Past Medical History:  Diagnosis Date   Anxiety    Arthritis    fingers   Chronic atrial fibrillation (HCC)    Depression    Family history of  breast cancer    Family history of cancer of male genital organ    Family history of gene mutation    BRIP1   Family history of malignant neoplasm of gastrointestinal tract    History of radiation therapy    08-23-24 - 09-12-24 Brain (parietal lobe) Dr. Lauraine Golden, MD   Hyperlipidemia    Hypertension    Personal  history of colonic polyps 11/12/2006   Sleep apnea     Past Surgical History:  Procedure Laterality Date   2-D echocardiogram  07/22/2010   Ejection fraction greater than 55%. Left atrium moderately dilated. Right atrium moderately dilated. 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 Mild to Moderate MR. Mild to moderate TR.   APPLICATION OF CRANIAL NAVIGATION Right 07/17/2024   Procedure: COMPUTER-ASSISTED NAVIGATION, FOR CRANIAL PROCEDURE;  Surgeon: Rosslyn Dino HERO, MD;  Location: [facility] OR;  Service: Neurosurgery;  Laterality: Right;  CRANIAL NAVIGATION   ATRIAL ABLATION SURGERY  11/09/2002   EP IMPLANTABLE DEVICE N/A 02/11/2016   Procedure: Loop Recorder Insertion;  Surgeon: Jerel Balding, MD;  Location: [facility] INVASIVE CV LAB;  Service: Cardiovascular;  Laterality: N/A;   Exercise Myoview stress test  07/08/2000   Nonischemic low-risk.   HIP FRACTURE SURGERY  1970s   IR GENERIC HISTORICAL  01/21/2017   IR RADIOLOGIST EVAL & MGMT 01/21/2017 [facility]-INTERV RAD   STERIOTACTIC STIMULATOR INSERTION Right 07/17/2024   Procedure: OPEN CRANIOTOMY FOR BIOPSY;  Surgeon: Rosslyn Dino HERO, MD;  Location[facility] OR;  Service: Neurosurgery;  Laterality: Right;  RIGHT STEREOTACTIC BRAIN BIOPSY       Scheduled Meds:  [START ON 12-27-24] apixaban   5 mg Oral BID   atorvastatin   40 mg Oral Daily   calcium  carbonate  1,250 mg Oral BID WC   carvedilol   25 mg Oral BID   dexamethasone   4 mg Oral Q12H   gabapentin   100 mg Oral TID   guaiFENesin   10 mL Oral Q8H   insulin  aspart  0-20 Units Subcutaneous TID WC   insulin  glargine-yfgn  20 Units Subcutaneous QHS   levETIRAcetam   1,000 mg Oral BID   magnesium  oxide  200 mg Oral QHS   melatonin  5 mg Oral QHS   nystatin    Topical BID   oxymetazoline   2 spray Each Nare BID   pantoprazole   40 mg Oral Daily   polyethylene glycol  17 g Oral Daily   QUEtiapine   25 mg Oral Daily   tamsulosin   0.4 mg Oral Daily   Continuous Infusions:  dextrose  5% lactated ringers  125 mL/hr at  12/13/24 1012   diltiazem  (CARDIZEM ) infusion 10 mg/hr (12/13/24 0804)   insulin  4.8 Units/hr (12/13/24 1212)   lactated ringers  Stopped (12/13/24 1009)   PRN Meds: acetaminophen  **OR** acetaminophen , dextrose , hydrALAZINE , HYDROmorphone  (DILAUDID ) injection, ipratropium, ondansetron  **OR** ondansetron  (ZOFRAN ) IV, oxyCODONE , senna-docusate, traZODone   Allergies:   Allergies[1]  Social History:   Social History   Socioeconomic History   Marital status: Divorced    Spouse name: Not on file   Number of children: 3   Years of education: 12   Highest education level: GED or equivalent  Occupational History   Not on file  Tobacco Use   Smoking status: Former    Current packs/day: 1.00    Types: Cigarettes   Smokeless tobacco: Never   Tobacco comments:    Smoked for approximately 6 months in his life  Vaping Use   Vaping status: Never Used  Substance and Sexual Activity   Alcohol use: No  Drug use: No   Sexual activity: Not Currently  Other Topics Concern   Not on file  Social History Narrative   Patient lives alone in [place] in a one story home. There are no steps.    Patient enjoys going to the gym, watching TV, and going to antique shops.    Patient is divorced with 3 children, two of which have passed; does have a girlfriend.    Patient has his own car and is able to drive.    Social Drivers of Health   Tobacco Use: Medium Risk (12/13/2024)   Patient History    Smoking Tobacco Use: Former    Smokeless Tobacco Use: Never    Passive Exposure: Not on file  Financial Resource Strain: Low Risk (03/22/2024)   Overall Financial Resource Strain (CARDIA)    Difficulty of Paying Living Expenses: Not hard at all  Food Insecurity: No Food Insecurity (11/03/2024)   Epic    Worried About Programme Researcher, Broadcasting/film/video in the Last Year: Never true    Ran Out of Food in the Last Year: Never true  Transportation Needs: No Transportation Needs (11/03/2024)   Epic    Lack of  Transportation (Medical): No    Lack of Transportation (Non-Medical): No  Recent Concern: Transportation Needs - Unmet Transportation Needs (09/18/2024)   Epic    Lack of Transportation (Medical): Yes    Lack of Transportation (Non-Medical): Yes  Physical Activity: Sufficiently Active (09/18/2024)   Exercise Vital Sign    Days of Exercise per Week: 5 days    Minutes of Exercise per Session: 60 min  Stress: No Stress Concern Present (09/18/2024)   [employer] of Occupational Health - Occupational Stress Questionnaire    Feeling of Stress: Only a little  Social Connections: Moderately Integrated (11/03/2024)   Social Connection and Isolation Panel    Frequency of Communication with Friends and Family: More than three times a week    Frequency of Social Gatherings with Friends and Family: More than three times a week    Attends Religious Services: More than 4 times per year    Active Member of Clubs or Organizations: No    Attends Banker Meetings: Never    Marital Status: Married  Catering Manager Violence: Not At Risk (11/03/2024)   Epic    Fear of Current or Ex-Partner: No    Emotionally Abused: No    Physically Abused: No    Sexually Abused: No  Depression (PHQ2-9): Low Risk (10/09/2024)   Depression (PHQ2-9)    PHQ-2 Score: 0  Recent Concern: Depression (PHQ2-9) - High Risk (09/18/2024)   Depression (PHQ2-9)    PHQ-2 Score: 12  Alcohol Screen: Low Risk (04/16/2023)   Alcohol Screen    Last Alcohol Screening Score (AUDIT): 0  Housing: Low Risk (11/03/2024)   Epic    Unable to Pay for Housing in the Last Year: No    Number of Times Moved in the Last Year: 0    Homeless in the Last Year: No  Utilities: Not At Risk (11/03/2024)   Epic    Threatened with loss of utilities: No  Health Literacy: Adequate Health Literacy (09/18/2024)   B1300 Health Literacy    Frequency of need for help with medical instructions: Rarely    Family History:   Family History   Problem Relation Age of Onset   Dementia Mother    Heart disease Father    Diabetes Father    Other Sister  BRIP1 gene mutation   Kidney disease Brother    Colon cancer Maternal Uncle    Lung cancer Paternal Aunt        d. >50   Cancer Paternal Aunt        unknown type, d. >50   Cancer Cousin 17       gynecologic (paternal first cousin)   Colon cancer Nephew 27       arising in colon polyp   Cancer Niece 105       gynecologic; niece in her 27's   Breast cancer Niece 74   Other Niece        BRIP1 gene mutation   Cancer Paternal Great-grandmother        abdominal cancer (PGF's mother) great grandmother     ROS:  Please see the history of present illness.  All other ROS reviewed and negative.     Physical Exam/Data: Vitals:   12/13/24 1200 12/13/24 1229 12/13/24 1230 12/13/24 1245  BP: 137/72  112/79   Pulse: 94 85  80  Resp: 19 17  14   Temp:      TempSrc:      SpO2: 95% 96%  99%  Weight:      Height:        Intake/Output Summary (Last 24 hours) at 12/13/2024 1333 Last data filed at 12/13/2024 0804 Gross per 24 hour  Intake 8.7 ml  Output --  Net 8.7 ml      12/13/2024    5:35 AM 11/29/2024   11:39 AM 11/29/2024   11:38 AM  Last 3 Weights  Weight (lbs) 158 lb 160 lb 15 oz 160 lb 15 oz  Weight (kg) 71.668 kg 73 kg 73 kg     Body mass index is 24.02 kg/m.  General:  Chronically ill appearing gentlemen in no acute distress HEENT: Rt eyelid droop Cardiac:  Irregularly, irregular, no murmur Lungs:  crackle at right base  Ext: no edema Skin: warm and dry   EKG:  The EKG was personally reviewed and demonstrates:  see hpi Telemetry:  Telemetry was personally reviewed and demonstrates:  coarse AF vs AFL HR initially 165 now ~90  Relevant CV Studies: Echocardiogram 06/2024 IMPRESSIONS     1. Left ventricular ejection fraction, by estimation, is 60 to 65%. Left  ventricular ejection fraction by PLAX is 61 %. The left ventricle has  normal function. The  left ventricle has no regional wall motion  abnormalities. There is moderate left  ventricular hypertrophy. Left ventricular diastolic function could not be  evaluated.   2. Right ventricular systolic function is moderately reduced. The right  ventricular size is mildly enlarged.   3. Left atrial size was moderately dilated.   4. Right atrial size was severely dilated.   5. The mitral valve is grossly normal. Trivial mitral valve  regurgitation.   6. The aortic valve is calcified. Aortic valve regurgitation is mild.  Aortic valve sclerosis/calcification is present, without any evidence of  aortic stenosis.   7. The inferior vena cava is dilated in size with <50% respiratory  variability, suggesting right atrial pressure of 15 mmHg.   8. Cannot exclude a small PFO.   9. Rhythm strip during this exam demonstrates atrial fibrillation.   Comparison(s): Changes from prior study are noted. 03/15/2024: LVEF 65-70%,  mildly dilated RV with normal systolic function.   Laboratory Data:  Chemistry Recent Labs  Lab 12/13/24 0546 12/13/24 0636 12/13/24 1140  NA 135 135 138  K  5.0 4.6 4.4  CL 100  --  105  CO2 18*  --  19*  GLUCOSE 487*  --  233*  BUN 39*  --  42*  CREATININE 0.79  --  0.63  CALCIUM  8.7*  --  8.4*  MG  --   --  2.1  GFRNONAA >60  --  >60  ANIONGAP 18*  --  14    Hematology Recent Labs  Lab 12/13/24 0546 12/13/24 0636 12/13/24 1140  WBC 12.4*  --  12.8*  RBC 4.43  --  3.95*  HGB 14.6 12.6* 12.9*  HCT 41.5 37.0* 36.3*  MCV 93.7  --  91.9  MCH 33.0  --  32.7  MCHC 35.2  --  35.5  RDW 13.5  --  13.4  PLT 233  --  216    Radiology/Studies:  No results found.   Assessment and Plan:  Permanent Atrial Fibrillation,  with episode of RVR Chad Vas score  Keep K > 4 and Mag > 2  Admitted with epistaxis though resolved with afrin and stable hgb. He also diagnosed with GBM s/p palliative radiation. While initially cleared on DOAC, with recent cognitive decline  and fall risk need to consider if eliquis  is safe to continue though eliquis  is also being used for DVT tx at this time too. Follow hgb trend. Will need to be careful with AV nodal blocking agents. He has a history of bradycardia with significant nighttime pauses from untreated OSA. He is now on IV dilt and max dose coreg . Suspect his RVR was in response to his DKA and epistaxis, some component of chronic steroids as well. Continue to monitor HR. May need atropine at bedside.   Start to wean IV dilt Continue Coreg  25 BID for now Continue Eliquis  5 mg BID  Apical Hypertrophic cardiomyopathy  RV failure  OSA not on CPAP Last loop remote check this month showed no concerning ventricular arrhythmias.   Originally plan was to pursue pHTN work-up wih [facility] if abnormal echocardiogram possible inspira though with his GBM do not think he would benefit from further evaluation.  ProBNP 1216 this admission On exam, does not appear volume up though is requiring oxygen. He has a productive cough, possibly from bloody nasal drainage. Consider CXR  Would hold on diuresis at this time  Hypertension BP: 112/79 Medications as above Temozolomide  for GBM  Hyperlipidemia Continue lipitor 40 mg  DVT On eliquis , see above  Per primary DKA  GBM c/b seizures -palliative care consulted  Deconditioning  BPH Seizures Epistaxis- resolved with afrin  Risk Assessment/Risk Scores:        CHA2DS2-VASc Score = 3   This indicates a 3.2% annual risk of stroke. The patient's score is based upon: CHF History: 0 HTN History: 1 Diabetes History: 1 Stroke History: 0 Vascular Disease History: 0 Age Score: 1 Gender Score: 0        For questions or updates, please contact [facility] Please consult [email or web address] for contact info under      Signed, Leontine LOISE Salen, PA-C  12/13/2024 1:33 PM     [1] No Known Allergies  "

## 2024-12-13 NOTE — H&P (Addendum)
 " History and Physical   Patient: Wayne Molina                 PCP: Janna Ferrier, DO                    DOB: 03/13/1956            DOA: 12/13/2024 FMW:[id number]             DOS: 12/13/2024, 9:33 AM  Janna Ferrier, DO  Patient coming from:   HOME  I have personally reviewed patient's medical records, in electronic medical records, including:  [facility], and care everywhere.    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  ()No history of diabetes mellitus presented to ED with chief complaint of persistent nosebleed.  And palpitation.   Patient reports has been having intermittent nosebleeds for the past week, but around 3 AM had a persistent significant nosebleed.  Complaining of some increased frequency urination, but no excessive thirst. Denies any recent illnesses denies any fever, chills, nausea or vomiting.   ED Evaluation: Blood pressure 116/86, pulse 92, temperature (!) 97.4 F (36.3 C), temperature source Axillary, resp. rate (!) 30, height 5' 8 (1.727 m), weight 71.7 kg, SpO2 96%. LABs: CO2 18, glucose 47, BUN 39, calcium  8.7, anion gap 18, ionized calcium  0.98, beta-hydroxybutyrate 4.38, WBC 12.4, hemoglobin 12.6,   Patient was noted to be hypoglycemic, diabetic ketoacidotic state.  Started on insulin  drip. Treated epistaxis with Afrin.  Nosebleed has stopped Patient was started on Cardizem  drip for A-fib with RVR  Requested patient to be admitted for DKA, new onset diabetes, likely induced by high steroids treatment for glioblastoma, and A-fib with RVR.  Epistaxis.    Patient Denies having: Fever, Chills, Cough, SOB, Chest Pain, Abd pain, N/V/D, headache, dizziness, lightheadedness,  Dysuria, Joint pain, rash, open wounds     Review of Systems: As per HPI,  otherwise 10 point review of systems were negative.   ----------------------------------------------------------------------------------------------------------------------  Allergies[1]  Home MEDs:  Prior to Admission medications  Medication Sig Start Date End Date Taking? Authorizing Provider  acetaminophen  (TYLENOL ) 325 MG tablet Take 1-2 tablets (325-650 mg total) by mouth every 4 (four) hours as needed for mild pain (pain score 1-3). 08/08/24   Angiulli, Daniel J, PA-C  amoxicillin -clavulanate (AUGMENTIN ) 875-125 MG tablet Take 1 tablet by mouth every 12 (twelve) hours. 11/23/24   Zelaya, Oscar A, PA-C  apixaban  (ELIQUIS ) 5 MG TABS tablet Take 1 tablet (5 mg total) by mouth 2 (two) times daily. 11/16/24   Angiulli, Toribio PARAS, PA-C  atorvastatin  (LIPITOR) 40 MG tablet Take 1 tablet (40 mg total) by mouth daily. 11/16/24   Angiulli, Toribio PARAS, PA-C  calcium  carbonate (OSCAL) 1500 (600 Ca) MG TABS tablet Take 1 tablet (1,500 mg total) by mouth 2 (two) times daily with a meal. 09/08/24   Janna Ferrier, DO  carvedilol  (COREG ) 12.5 MG tablet Take 1 tablet (12.5 mg total) by mouth 2 (two) times daily. 11/16/24   Angiulli, Daniel J, PA-C  Cyanocobalamin (VITAMIN B-12 PO) Take 1 capsule by mouth daily.    [provider]  dexamethasone  (DECADRON ) 4 MG tablet Take 1 tablet (4 mg total) by mouth 2 (two) times daily. 12/12/24   Vaslow, Zachary K, MD  gabapentin  (NEURONTIN ) 100 MG  capsule Take 1 capsule (100 mg total) by mouth 3 (three) times daily. 11/16/24   Angiulli, Toribio PARAS, PA-C  levETIRAcetam  (KEPPRA ) 1000 MG tablet Take 1 tablet (1,000 mg total) by mouth 2 (two) times daily. 11/16/24   Angiulli, Toribio PARAS, PA-C  magnesium  oxide (MAG-OX) 400 (240 Mg) MG tablet Take 0.5 tablets (200 mg total) by mouth daily in the afternoon. 11/16/24   Angiulli, Daniel J, PA-C  melatonin 3 MG TABS tablet Take 1 tablet (3 mg total) by mouth at bedtime. 11/16/24   Angiulli, Daniel J, PA-C  Multiple Vitamin (MULTIVITAMIN ADULT  PO) Take 1 tablet by mouth daily. 02/07/18   [provider]  nystatin  (MYCOSTATIN /NYSTOP ) powder Apply topically 2 (two) times daily. 11/15/24   Angiulli, Toribio PARAS, PA-C  pantoprazole  (PROTONIX ) 40 MG tablet Take 1 tablet (40 mg total) by mouth daily. 11/16/24   Angiulli, Toribio PARAS, PA-C  polyethylene glycol (MIRALAX  / GLYCOLAX ) 17 g packet Take 17 g by mouth daily.    [provider]  QUEtiapine  (SEROQUEL ) 25 MG tablet Take 1 tablet (25 mg total) by mouth daily. 11/16/24   Angiulli, Daniel J, PA-C  sodium chloride  (OCEAN) 0.65 % SOLN nasal spray Place 2 sprays into both nostrils every 2 (two) hours. 11/27/24   Tobie Eldora NOVAK, MD  tamsulosin  (FLOMAX ) 0.4 MG CAPS capsule Take 1 capsule (0.4 mg total) by mouth daily. 11/16/24   Angiulli, Toribio PARAS, PA-C  temozolomide  (TEMODAR ) 140 MG capsule Take 2 capsules (280 mg total) by mouth daily. Take for 5 days on, 23 days off. Repeat every 28 days. May take on an empty stomach to decrease nausea & vomiting. 10/09/24   Vaslow, Zachary K, MD    PRN MEDs: acetaminophen  **OR** acetaminophen , dextrose , hydrALAZINE , HYDROmorphone  (DILAUDID ) injection, ipratropium, ondansetron  **OR** ondansetron  (ZOFRAN ) IV, oxyCODONE , senna-docusate, traZODone   Past Medical History:  Diagnosis Date   Anxiety    Arthritis    fingers   Chronic atrial fibrillation (HCC)    Depression    Family history of breast cancer    Family history of cancer of male genital organ    Family history of gene mutation    BRIP1   Family history of malignant neoplasm of gastrointestinal tract    History of radiation therapy    08-23-24 - 09-12-24 Brain (parietal lobe) Dr. Lauraine Golden, MD   Hyperlipidemia    Hypertension    Personal history of colonic polyps 11/12/2006   Sleep apnea     Past Surgical History:  Procedure Laterality Date   2-D echocardiogram  07/22/2010   Ejection fraction greater than 55%. Left atrium moderately dilated. Right atrium moderately dilated. 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 Mild to Moderate MR. Mild to moderate TR.   APPLICATION OF CRANIAL NAVIGATION Right 07/17/2024   Procedure: COMPUTER-ASSISTED NAVIGATION, FOR CRANIAL PROCEDURE;  Surgeon: Rosslyn Dino HERO, MD;  Location: [facility] OR;  Service: Neurosurgery;  Laterality: Right;  CRANIAL NAVIGATION   ATRIAL ABLATION SURGERY  11/09/2002   EP IMPLANTABLE DEVICE N/A 02/11/2016   Procedure: Loop Recorder Insertion;  Surgeon: Jerel Balding, MD;  Location: [facility] INVASIVE CV LAB;  Service: Cardiovascular;  Laterality: N/A;   Exercise Myoview stress test  07/08/2000   Nonischemic low-risk.   HIP FRACTURE SURGERY  1970s   IR GENERIC HISTORICAL  01/21/2017   IR RADIOLOGIST EVAL & MGMT 01/21/2017 [facility]   STERIOTACTIC STIMULATOR INSERTION Right 07/17/2024   Procedure: OPEN CRANIOTOMY FOR BIOPSY;  Surgeon: Rosslyn Dino HERO, MD;  Location: [facility] OR;  Service: Neurosurgery;  Laterality: Right;  RIGHT STEREOTACTIC BRAIN BIOPSY     reports that he has quit smoking. His smoking use included cigarettes. He has never used smokeless tobacco. He reports that he does not drink alcohol and does not use drugs.   Family History  Problem Relation Age of Onset   Dementia Mother    Heart disease Father    Diabetes Father    Other Sister        BRIP1 gene mutation   Kidney disease Brother    Colon cancer Maternal Uncle    Lung cancer Paternal Aunt        d. >50   Cancer Paternal Aunt        unknown type, d. >50   Cancer Cousin 17       gynecologic (paternal first cousin)   Colon cancer Nephew 27       arising in colon polyp   Cancer Niece 33       gynecologic; niece in her 25's   Breast cancer Niece 64   Other Niece        BRIP1 gene mutation   Cancer Paternal Great-grandmother        abdominal cancer (PGF's mother) great grandmother    Physical Exam:   Vitals:   12/13/24 0744 12/13/24 0745 12/13/24 0800 12/13/24 0815  BP: 136/77 126/76 (!) 123/94 116/86  Pulse: 76 90 92   Resp: (!) 28 (!) 26 (!)  30 (!) 30  Temp:      TempSrc:      SpO2: 96% 96% 94% 96%  Weight:      Height:       Constitutional: NAD, calm, comfortable Eyes: PERRL, lids and conjunctivae normal ENMT: Mucous membranes are moist. Posterior pharynx clear of any exudate or lesions.Normal dentition.  Neck: normal, supple, no masses, no thyromegaly Respiratory: clear to auscultation bilaterally, no wheezing, no crackles. Normal respiratory effort. No accessory muscle use.  Cardiovascular: Regular rate and rhythm, no murmurs / rubs / gallops. No extremity edema. 2+ pedal pulses. No carotid bruits.  Abdomen: no tenderness, no masses palpated. No hepatosplenomegaly. Bowel sounds positive.  Musculoskeletal: no clubbing / cyanosis. No joint deformity upper and lower extremities. Good ROM, no contractures. Normal muscle tone.  Neurologic: CN II-XII grossly intact. Sensation intact, DTR normal. Strength 5/5 in all 4.  Psychiatric: Normal judgment and insight. Alert and oriented x 3. Normal mood.  Skin: no rashes, lesions, ulcers. No induration          Labs on admission:    I have personally reviewed following labs and imaging studies  CBC: Recent Labs  Lab 12/13/24 0546 12/13/24 0636  WBC 12.4*  --   NEUTROABS 11.4*  --   HGB 14.6 12.6*  HCT 41.5 37.0*  MCV 93.7  --   PLT 233  --    Basic Metabolic Panel: Recent Labs  Lab 12/13/24 0546 12/13/24 0636  NA 135 135  K 5.0 4.6  CL 100  --   CO2 18*  --   GLUCOSE 487*  --   BUN 39*  --   CREATININE 0.79  --   CALCIUM  8.7*  --    GFR: Estimated Creatinine Clearance: 83.1 mL/min (by C-G formula based on SCr of 0.79 mg/dL). Liver Function Tests: No results for input(s): AST, ALT, ALKPHOS, BILITOT, PROT, ALBUMIN in the last 168 hours. No results for input(s): LIPASE, AMYLASE in the last 168 hours. No results for input(s): AMMONIA in the  last 168 hours. Coagulation Profile: Recent Labs  Lab 12/13/24 0650  INR 1.2    CBG: Recent  Labs  Lab 12/13/24 0647 12/13/24 0801 12/13/24 0901  GLUCAP 479* 446* 313*    Urine analysis:    Component Value Date/Time   COLORURINE YELLOW 11/09/2024 1316   APPEARANCEUR CLEAR 11/09/2024 1316   LABSPEC 1.021 11/09/2024 1316   PHURINE 5.0 11/09/2024 1316   GLUCOSEU >=500 (A) 11/09/2024 1316   HGBUR NEGATIVE 11/09/2024 1316   BILIRUBINUR NEGATIVE 11/09/2024 1316   KETONESUR NEGATIVE 11/09/2024 1316   PROTEINUR 30 (A) 11/09/2024 1316   NITRITE NEGATIVE 11/09/2024 1316   LEUKOCYTESUR NEGATIVE 11/09/2024 1316    Last A1C:  Lab Results  Component Value Date   HGBA1C 6.0 (H) 07/07/2024     Radiologic Exams on Admission:   No results found.  EKG:   Independently reviewed.  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   GBM (glioblastoma multiforme) (HCC)   Hypertension   Hyperlipidemia   Generalized weakness   Palliative care by specialist   Benign prostatic hyperplasia with urinary frequency   Epistaxis   Assessment and Plan: * DKA (diabetic ketoacidosis) (HCC) 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 -Trend H&H, mild drop noted -Continuing Afrin nasal spray as needed -monitoring closely (Afrin -may have contributed to RVR)   Atrial fibrillation with RVR (HCC) POA heart rate in the 160s -In ED patient started on Cardizem  drip, titrating up for heart rate less than 100 -MedRec reviewed not on any rate control meds -On Eliquis , (will continue, also presented with epistaxis) -Appreciate cardiology input   GBM (glioblastoma multiforme) (HCC) On high-dose Decadron , tapering off -Continue Temozolomide  -Follow-up with oncologist as outpatient -Palliative care consulted  Last MRI from January 30/2026 -reviewed Interval decrease in size and enhancement of the right temporoparietal mass. Residual nodular enhancement abutting the right lateral ventricle and additional residual thicker enhancement along the right posterior temporal lobe, which are not significantly changed. 2. Significantly decreased vasogenic edema.  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 Stable, no seizure episodes, tapering off steroids -Continue Keppra    Generalized weakness Once patient stable, consulting PT OT for evaluation Fall precaution  Hyperlipidemia Continue atorvastatin   Hypertension BP remains stable on Cardizem  drip -Restarting carvedilol   Benign prostatic hyperplasia with urinary frequency Monitoring for urine retention, continue tamsulosin   Palliative care by specialist Consulting palliative care to discuss CODE STATUS and goals of care -Patient verbalizes DNR/DNI status at this point        Consults called: Cardiology -------------------------------------------------------------------------------------------------------------------------------------------- DVT prophylaxis:  SCDs Start: 12/13/24 0812 Place TED hose Start: 12/13/24 0812 apixaban  (ELIQUIS ) tablet 5 mg   Code Status:   Code Status: Full Code   Admission status: Patient will be  admitted as Inpatient, with a  greater than 2 midnight length of stay. Level of care: Telemetry   Family Communication:  none at bedside  (The above findings and plan of care has been discussed with patient in detail, the patient expressed understanding and agreement of above plan)  --------------------------------------------------------------------------------------------------------------------------------------------------  Disposition Plan:  Anticipated 1-2 days Status is: Inpatient Remains inpatient appropriate because: Patient is in DKA, needing aggressive IV fluids, insulin  drip, with RVR, needing Cardizem  drip for rate control.[name]       --------------------------------------------------------------------------------------------------------------------------  Time spent:  37  Min.  Was spent seeing and evaluating the patient, reviewing all medical records, drawn plan of care.  SIGNED: Adriana DELENA Grams, MD, FHM. FAAFP. [facility], Pager  (Please use [email or web address] to page/ or secure chat through epic) If 7PM-7AM, please contact night-coverage [email or web address],  12/13/2024, 9:33 AM     [1] No Known Allergies  "

## 2024-12-13 NOTE — ED Provider Notes (Signed)
 " Physical Exam  BP (!) 123/94   Pulse 92   Temp (!) 97.4 F (36.3 C) (Axillary)   Resp (!) 30   Ht 5' 8 (1.727 m)   Wt 71.7 kg   SpO2 94%   BMI 24.02 kg/m   Physical Exam Vitals and nursing note reviewed.  Constitutional:      Appearance: He is ill-appearing.  HENT:     Head: Normocephalic and atraumatic.     Mouth/Throat:     Mouth: Mucous membranes are dry.  Cardiovascular:     Rate and Rhythm: Rhythm irregular.  Pulmonary:     Breath sounds: No wheezing or rales.  Abdominal:     General: Abdomen is flat.  Musculoskeletal:     Cervical back: Normal range of motion and neck supple.  Skin:    General: Skin is warm and dry.  Neurological:     Mental Status: He is disoriented.     Procedures  .Critical Care  Performed by: Madalina Rosman, PA-C Authorized by: Adamaris King, PA-C   Critical care provider statement:    Critical care time (minutes):  45   Critical care start time:  12/13/2024 6:30 AM   Critical care end time:  12/13/2024 7:15 AM   Critical care was necessary to treat or prevent imminent or life-threatening deterioration of the following conditions:  Cardiac failure and endocrine crisis   Critical care was time spent personally by me on the following activities:  Examination of patient, discussions with consultants and evaluation of patient's response to treatment   Care discussed with: admitting provider     ED Course / MDM   Clinical Course as of 12/13/24 0832  Wed Dec 13, 2024  0635 Hemoglobin: 14.6 [JS]  0648 Glucose-Capillary(!): 479 [JS]  0751 Anion gap(!): 18 [JS]  0751 Glucose(!): 487 [JS]  0751 Osmolality(!!): 326 [JS]  0814 Beta-Hydroxybutyric Acid(!): 4.38 [JS]    Clinical Course User Index [JS] Kaden Daughdrill, PA-C   Medical Decision Making Amount and/or Complexity of Data Reviewed Labs: ordered. Decision-making details documented in ED Course.  Risk OTC drugs. Prescription drug management.   Patient care assumed from Camie Pickett PA  at shift change, please see her note for a full HPI. Briefly, patient here with epistaxis but also CBG 524 but no underlying diabetes. Also history of Afib with HR 126, Cardizem  has been ordered. Patient blood sugar here is 479, no prior underlying diabetes will start medication to help with glucose control.   7:05 AM Spoke to wife [name] on the phone, he does not have a prior history of diabetes however due to his glioblastoma diagnosis he has been taking 16 mg of steroids for a few weeks, and this was now decreased to 8 mg which he began taking yesterday.  He was originally diagnosed with his glioblastoma on August 2025, they did have a palliative care visit yesterday, but patient will start treatment for radiation in approximately 3 weeks.  Patient is currently taking his Eliquis  for underlying history of A-fib, according to the wife he does report compliance of this medication.He also had previously signed DNR paperwork in the past, however according to our hospital records he was a full code on January 2026.   8:10 AM BMP with a glucose of 487, BUN is slightly increased.  Anion gap of 18 and beta hydroxybutyrate of 4, consistent with DKA.  No underlying history of diabetes in the past, suspect likely due to steroids.  CBC with a slight leukocytosis.  Patient with stable vital signs in rate has improved to 92 after Cardizem  drip.  Spoke to hospitalist Dr. Willette who will admit patient for further management. Patient stable for admission.    Portions of this note were generated with Scientist, clinical (histocompatibility and immunogenetics). Dictation errors may occur despite best attempts at proofreading.         Athena Baltz, PA-C 12/13/24 [id number]  "

## 2024-12-13 NOTE — ED Notes (Signed)
 Full pt care complete, bed and brief changed w RN

## 2024-12-13 NOTE — ED Provider Notes (Signed)
 I provided a substantive portion of the care of this patient.  I personally made/approved the management plan for this patient and take responsibility for the patient management. {Remember to document shared critical care using edcritical dot phrase:1} EKG Interpretation Date/Time:  Wednesday December 13 2024 06:54:35 EST Ventricular Rate:  166 PR Interval:    QRS Duration:  113 QT Interval:  307 QTC Calculation: 511 R Axis:   42  Text Interpretation: Atrial fibrillation with rapid V-rate Borderline intraventricular conduction delay Repolarization abnormality, prob rate related Compared with prior EKG from 11/03/2024 Confirmed by Kammerer, Megan ([id number]) on 12/13/2024 7:23:17 AM

## 2024-12-13 NOTE — ED Notes (Signed)
 Cardiologist at bedside, given verbal orders to decrease Diltiazem  rate to 22mL/hr.

## 2024-12-13 NOTE — Assessment & Plan Note (Signed)
 POA heart rate in the 160s -In ED patient started on Cardizem  drip, titrating up for heart rate less than 100 -MedRec reviewed not on any rate control meds -On Eliquis , (will continue, also presented with epistaxis) -Appreciate cardiology input

## 2024-12-13 NOTE — Assessment & Plan Note (Addendum)
 On high-dose Decadron , tapering off -Continue Temozolomide  -Follow-up with oncologist as outpatient -Palliative care consulted  Last MRI from January 30/2026 -reviewed Interval decrease in size and enhancement of the right temporoparietal mass. Residual nodular enhancement abutting the right lateral ventricle and additional residual thicker enhancement along the right posterior temporal lobe, which are not significantly changed. 2. Significantly decreased vasogenic edema.

## 2024-12-13 NOTE — ED Notes (Signed)
 Discussed the pt's current status on the phone with Adriana, MD. MD notified of pt's BP being soft, the pt failing his swallow screen, and the pt starting to grab at the right side of his head in pain. Pt was given Dilaudid  0.5mg , provider okay with the small dose for pain. Requesting neuro assessments when pt is not as sleepy and when the dilaudid  wears off. Keppra  and Decadron  will be changed to IV.

## 2024-12-13 NOTE — Assessment & Plan Note (Signed)
 Consulting palliative care to discuss CODE STATUS and goals of care -Patient verbalizes DNR/DNI status at this point

## 2024-12-13 NOTE — Assessment & Plan Note (Signed)
 Continue atorvastatin  Continue atorvastatin .

## 2024-12-13 NOTE — Assessment & Plan Note (Signed)
 Once patient stable, consulting PT OT for evaluation Fall precaution

## 2024-12-13 NOTE — Assessment & Plan Note (Signed)
 Monitoring for urine retention, continue tamsulosin 

## 2024-12-13 NOTE — ED Provider Notes (Signed)
 " Wayne Molina   CSN: [id number] Arrival date & time: 12/13/24  9480     Patient presents with: No chief complaint on file.   Wayne Molina is a 69 y.o. male.  {Add pertinent medical, surgical, social history, OB history to YEP:[id number]} Patient with history of chronic afib on Eliquis , glioblastoma, hypertension, hyperlipidemia presents today with complaints of epistaxis. Reports that he has been having a nosebleed off and on for the past few weeks. This time bleeding reportedly began at 3:30 this morning. EMS also noted patients blood sugar was in the 500s, has no history of diabetes but is on chronic steroids for glioblastoma.   The history is provided by the patient. No language interpreter was used.       Prior to Admission medications  Medication Sig Start Date End Date Taking? Authorizing Provider  acetaminophen  (TYLENOL ) 325 MG tablet Take 1-2 tablets (325-650 mg total) by mouth every 4 (four) hours as needed for mild pain (pain score 1-3). 08/08/24   Angiulli, Daniel J, PA-C  amoxicillin -clavulanate (AUGMENTIN ) 875-125 MG tablet Take 1 tablet by mouth every 12 (twelve) hours. 11/23/24   Zelaya, Oscar A, PA-C  apixaban  (ELIQUIS ) 5 MG TABS tablet Take 1 tablet (5 mg total) by mouth 2 (two) times daily. 11/16/24   Angiulli, Toribio PARAS, PA-C  atorvastatin  (LIPITOR) 40 MG tablet Take 1 tablet (40 mg total) by mouth daily. 11/16/24   Angiulli, Toribio PARAS, PA-C  calcium  carbonate (OSCAL) 1500 (600 Ca) MG TABS tablet Take 1 tablet (1,500 mg total) by mouth 2 (two) times daily with a meal. 09/08/24   Janna Ferrier, DO  carvedilol  (COREG ) 12.5 MG tablet Take 1 tablet (12.5 mg total) by mouth 2 (two) times daily. 11/16/24   Angiulli, Daniel J, PA-C  Cyanocobalamin (VITAMIN B-12 PO) Take 1 capsule by mouth daily.    [provider]  dexamethasone  (DECADRON ) 4 MG tablet Take 1 tablet (4 mg total) by mouth 2 (two) times daily. 12/12/24   Vaslow,  Zachary K, MD  gabapentin  (NEURONTIN ) 100 MG capsule Take 1 capsule (100 mg total) by mouth 3 (three) times daily. 11/16/24   Angiulli, Toribio PARAS, PA-C  levETIRAcetam  (KEPPRA ) 1000 MG tablet Take 1 tablet (1,000 mg total) by mouth 2 (two) times daily. 11/16/24   Angiulli, Toribio PARAS, PA-C  magnesium  oxide (MAG-OX) 400 (240 Mg) MG tablet Take 0.5 tablets (200 mg total) by mouth daily in the afternoon. 11/16/24   Angiulli, Daniel J, PA-C  melatonin 3 MG TABS tablet Take 1 tablet (3 mg total) by mouth at bedtime. 11/16/24   Angiulli, Daniel J, PA-C  Multiple Vitamin (MULTIVITAMIN ADULT PO) Take 1 tablet by mouth daily. 02/07/18   [provider]  nystatin  (MYCOSTATIN /NYSTOP ) powder Apply topically 2 (two) times daily. 11/15/24   Angiulli, Toribio PARAS, PA-C  pantoprazole  (PROTONIX ) 40 MG tablet Take 1 tablet (40 mg total) by mouth daily. 11/16/24   Angiulli, Toribio PARAS, PA-C  polyethylene glycol (MIRALAX  / GLYCOLAX ) 17 g packet Take 17 g by mouth daily.    [provider]  QUEtiapine  (SEROQUEL ) 25 MG tablet Take 1 tablet (25 mg total) by mouth daily. 11/16/24   Angiulli, Daniel J, PA-C  sodium chloride  (OCEAN) 0.65 % SOLN nasal spray Place 2 sprays into both nostrils every 2 (two) hours. 11/27/24   Tobie Eldora NOVAK, MD  tamsulosin  (FLOMAX ) 0.4 MG CAPS capsule Take 1 capsule (0.4 mg total) by mouth daily. 11/16/24  Angiulli, Toribio PARAS, PA-C  temozolomide  (TEMODAR ) 140 MG capsule Take 2 capsules (280 mg total) by mouth daily. Take for 5 days on, 23 days off. Repeat every 28 days. May take on an empty stomach to decrease nausea & vomiting. 10/09/24   Vaslow, Zachary K, MD    Allergies: Patient has no known allergies.    Review of Systems  HENT:  Positive for nosebleeds.   All other systems reviewed and are negative.   Updated Vital Signs BP (!) 149/102 (BP Location: Right Arm)   Pulse (!) 126   Resp 20   Ht 5' 8 (1.727 m)   Wt 71.7 kg   SpO2 95%   BMI 24.02 kg/m   Physical Exam Vitals and nursing  Molina reviewed.  Constitutional:      General: He is not in acute distress.    Appearance: He is normal weight. He is not toxic-appearing or diaphoretic.     Comments: Chronically ill appearing  HENT:     Head: Normocephalic and atraumatic.     Comments: Dried blood present in bilateral nares. No signs of active bleeding    Mouth/Throat:     Comments: Large blood clot presents in the oropharynx Cardiovascular:     Rate and Rhythm: Tachycardia present. Rhythm irregular.  Pulmonary:     Effort: Pulmonary effort is normal. No respiratory distress.  Musculoskeletal:        General: Normal range of motion.     Cervical back: Normal range of motion.  Skin:    General: Skin is warm and dry.  Neurological:     General: No focal deficit present.     Mental Status: He is alert.  Psychiatric:        Mood and Affect: Mood normal.        Behavior: Behavior normal.     (all labs ordered are listed, but only abnormal results are displayed) Labs Reviewed  CBC WITH DIFFERENTIAL/PLATELET - Abnormal; Notable for the following components:      Result Value   WBC 12.4 (*)    Neutro Abs 11.4 (*)    Lymphs Abs 0.3 (*)    Abs Immature Granulocytes 0.16 (*)    All other components within normal limits  I-STAT VENOUS BLOOD GAS, ED - Abnormal; Notable for the following components:   pH, Ven 7.446 (*)    pCO2, Ven 24.8 (*)    Bicarbonate 17.1 (*)    TCO2 18 (*)    Acid-base deficit 5.0 (*)    Calcium , Ion 0.98 (*)    HCT 37.0 (*)    Hemoglobin 12.6 (*)    All other components within normal limits  BASIC METABOLIC PANEL WITH GFR  URINALYSIS, ROUTINE W REFLEX MICROSCOPIC  BETA-HYDROXYBUTYRIC ACID  PROTIME-INR  CBG MONITORING, ED  TYPE AND SCREEN  ABO/RH    EKG: None  Radiology: No results found.  {Document cardiac monitor, telemetry assessment procedure when appropriate:32947} Procedures   Medications Ordered in the ED - No data to display  Clinical Course as of 12/13/24 0655   Wed Dec 13, 2024  0635 Hemoglobin: 14.6 [JS]  0648 Glucose-Capillary(!): 479 [JS]    Clinical Course User Index [JS] Soto, Johana, PA-C   {Click here for ABCD2, HEART and other calculators REFRESH Molina before signing:1}                              Medical Decision Making Amount and/or Complexity  of Data Reviewed Labs: ordered.   This patient is a 69 y.o. male who presents to the ED for concern of epistaxis, hyperglycemia, tachycardia, this involves an extensive number of treatment options, and is a complaint that carries with it a high risk of complications and morbidity. The emergent differential diagnosis prior to evaluation includes, but is not limited to,  DKA/HHS, anemia, sepsis . This is not an exhaustive differential.   Past Medical History / Co-morbidities / Social History:  has a past medical history of Anxiety, Arthritis, Chronic atrial fibrillation (HCC), Depression, Family history of breast cancer, Family history of cancer of male genital organ, Family history of gene mutation, Family history of malignant neoplasm of gastrointestinal tract, History of radiation therapy, Hyperlipidemia, Hypertension, Personal history of colonic polyps (11/12/2006), and Sleep apnea.  Additional history: Chart reviewed. Pertinent results include: ***  Physical Exam: Physical exam performed. The pertinent findings include: large blood clot present in the oropharynx with dried blood in bilateral nares. Able to use suction and get patient to cough up the large blood clot, sprayed out bilateral nares with Afrin, holding pressure currently without any residual bleeding present from the nares or within the oropharynx  Lab Tests: I ordered, and personally interpreted labs.  The pertinent results include:  ***   Imaging Studies: I ordered imaging studies including ***. I independently visualized and interpreted imaging which showed ***. I agree with the radiologist interpretation.   Cardiac  Monitoring:  The patient was maintained on a cardiac monitor.  My attending physician Dr. PIERRETTE viewed and interpreted the cardiac monitored which showed an underlying rhythm of: ***. I agree with this interpretation.   Medications: I ordered medication including ***  for ***. Reevaluation of the patient after these medicines showed that the patient {resolved/improved/worsened:23923::improved}. I have reviewed the patients home medicines and have made adjustments as needed.  Consultations Obtained: I requested consultation with the ***,  and discussed lab and imaging findings as well as pertinent plan - they recommend: ***   Disposition: After consideration of the diagnostic results and the patients response to treatment, I feel that *** .   ***emergency department workup does not suggest an emergent condition requiring admission or immediate intervention beyond what has been performed at this time. The plan is: ***. The patient is safe for discharge and has been instructed to return immediately for worsening symptoms, change in symptoms or any other concerns.  I discussed this case with my attending physician Dr. PIERRETTE who cosigned this Molina including patient's presenting symptoms, physical exam, and planned diagnostics and interventions. Attending physician stated agreement with plan or made changes to plan which were implemented.     {Document critical care time when appropriate  Document review of labs and clinical decision tools ie CHADS2VASC2, etc  Document your independent review of radiology images and any outside records  Document your discussion with family members, caretakers and with consultants  Document social determinants of health affecting pt's care  Document your decision making why or why not admission, treatments were needed:32947:::1}   Final diagnoses:  None    ED Discharge Orders     None        "

## 2024-12-13 NOTE — Hospital Course (Addendum)
 Wayne Molina is a 69 year old male with extensive history of glioblastoma (on high-dose steroid), HTN, HLD, anxiety/depression, A-fib (on Eliquis ), seizures, GERD..  (No history of diabetes mellitus)   presented to ED with chief complaint of persistent nosebleed.  And palpitation.  Patient reports has been having intermittent nosebleeds for the past week, but around 3 AM had a persistent significant nosebleed.  Complaining of some increased frequency urination, but no excessive thirst. Denies any recent illnesses denies any fever, chills, nausea or vomiting.   ED Evaluation: Blood pressure 116/86, pulse 92, temperature (!) 97.4 F (36.3 C), temperature source Axillary, resp. rate (!) 30, height 5' 8 (1.727 m), weight 71.7 kg, SpO2 96%. LABs: CO2 18, glucose 47, BUN 39, calcium  8.7, anion gap 18, ionized calcium  0.98, beta-hydroxybutyrate 4.38, WBC 12.4, hemoglobin 12.6,   Patient was noted to be hypoglycemic, diabetic ketoacidotic state.  Started on insulin  drip. Treated epistaxis with Afrin.  Nosebleed has stopped Patient was started on Cardizem  drip for A-fib with RVR  Requested patient to be admitted for DKA, new onset diabetes, likely induced by high steroids treatment for glioblastoma, and A-fib with RVR.  Epistaxis.

## 2024-12-13 NOTE — ED Triage Notes (Signed)
 Presents via EMS for nosebleed and hyperglycemia. Significant amount of blood in bed per EMS. Bleeding began approximately 0330. CBG 524. Takes eliquis . EMS also states patient was in Afib RVR upon EMS arrival. Family endorses increased weakness. Hx of brain cancer.

## 2024-12-13 NOTE — Assessment & Plan Note (Signed)
 Stable, no seizure episodes, tapering off steroids -Continue Keppra 

## 2024-12-13 NOTE — Assessment & Plan Note (Signed)
 Patient report has been having nosebleed off-and-on this week, but progressively worsened this a.m. -Trend H&H, mild drop noted -Continuing Afrin nasal spray monitoring closely

## 2024-12-13 NOTE — Consult Note (Signed)
 "                              Consultation Note Date: 12/13/2024   Patient Name: Wayne Molina  DOB: [birth year]  MRN: [id number]  Age / Sex: 69 y.o., male  PCP: Janna Ferrier, DO Referring Physician: Willette Adriana LABOR, MD  Reason for Consultation: Establishing goals of care  HPI/Patient Profile: 69 y.o. male  with past medical history of right parietal glioblastoma on high-dose steroids admitted on 12/13/2024 with nosebleed.   Nosebleed resolved in the ED with Afrin, however he was noted to be in A-fib with RVR as well as DKA.  PMT has been consulted to assist with goals of care conversation in the context of new diabetes diagnosis, as well as CODE STATUS.  Clinical Assessment and Goals of Care:  I have reviewed medical records including H&P, ED provider note, outpatient palliative note from yesterday, labs and imaging. Discussed with RN and MD, assessed the patient and then met at the bedside to discuss diagnosis prognosis, GOC, EOL wishes, disposition and options.  I introduced Palliative Medicine as specialized medical care for people living with serious illness. It focuses on providing relief from the symptoms and stress of a serious illness. The goal is to improve quality of life for both the patient and the family.  We discussed a brief life review of the patient and then focused on their current illness.   I attempted to elicit values and goals of care important to the patient.    Medical History Review and Understanding:  We discussed patient's acute illness in the context of their chronic comorbidities.  Patient understands the severity of his illness.  Social History: Patient lives at home with his wife.  He has a daughter and stepdaughter.  Has worked for [employer] in [employer].  Functional and Nutritional State: Progressive functional and nutritional decline.  Palliative Symptoms: Fatigue, patient denies other symptoms at this time  Advance Directives: A  detailed discussion regarding advanced directives was had.  He has completed documentation 08/08/2024 which is on file.  His wife is HCPOA.  Goals of care document in [facility] from 08/31/2024 reviewed as well.  Code Status: Concepts specific to code status, artifical feeding and hydration, and rehospitalization were considered and discussed.  DNR/DNI confirmed.  Discussion: I clarified patient's CODE STATUS with him at the bedside.  His wishes are clear on this, no CPR or intubation, no tubes of any kind.  I provided emotional support and therapeutic listening as he expressed his desire to continue treating the treatable.   Explored whether additional interventions, such as regular glucose checks and insulin , would be a deal breaker for his quality of life or change his overall goals of care.  He finds this acceptable and is agreeable to managing his glucose as recommended by the medical team to stabilize him as much as possible.   Discussed the importance of continued conversation with family and the medical providers regarding overall plan of care and treatment options, ensuring decisions are within the context of the patients values and GOCs.   Questions and concerns were addressed. The family was encouraged to call with questions or concerns.  PMT will continue to support holistically.   SUMMARY OF RECOMMENDATIONS   - CODE STATUS updated to DNR-Limited.  Will fill out gold form and scan into EMR to ensure that this translates across his admissions - Patient states his desire  to continue life-prolonging interventions for treatable conditions, including diabetes management moving forward - Psychosocial and emotional support provided - PMT will continue to follow and support  Prognosis:  Poor  Discharge Planning: To Be Determined      Primary Diagnoses: Present on Admission:  DKA (diabetic ketoacidosis) (HCC)  Atrial fibrillation with RVR (HCC)  Epistaxis  Benign prostatic hyperplasia  with urinary frequency  GBM (glioblastoma multiforme) (HCC)  Hyperlipidemia  Hypertension   Physical Exam Vitals and nursing note reviewed.  Constitutional:      General: He is not in acute distress.    Appearance: He is ill-appearing.     Comments: fatigued  HENT:     Head: Normocephalic and atraumatic.  Cardiovascular:     Rate and Rhythm: Normal rate.  Pulmonary:     Effort: Pulmonary effort is normal. No respiratory distress.  Neurological:     Mental Status: He is alert. Mental status is at baseline.  Psychiatric:        Mood and Affect: Mood normal.        Behavior: Behavior normal.     Vital Signs: BP 101/83   Pulse 99   Temp (!) 97.4 F (36.3 C) (Axillary)   Resp 20   Ht 5' 8 (1.727 m)   Wt 71.7 kg   SpO2 98%   BMI 24.02 kg/m  Pain Scale: 0-10   Pain Score: 0-No pain   SpO2: SpO2: 98 % O2 Device:SpO2: 98 % O2 Flow Rate: .    Phila Shoaf P Jemal Miskell, PA-C  Palliative Medicine Team Team phone # [phone]  Thank you for allowing the Palliative Medicine Team to assist in the care of this patient. Please utilize secure chat with additional questions, if there is no response within 30 minutes please call the above phone number.  Palliative Medicine Team providers are available by phone from 7am to 7pm daily and can be reached through the team cell phone.  Should this patient require assistance outside of these hours, please call the patient's attending physician.   I personally spent a total of 40 minutes in the care of the patient today including preparing to see the patient, getting/reviewing separately obtained history, performing a medically appropriate exam/evaluation, counseling and educating, placing orders, referring and communicating with other health care professionals, and documenting clinical information in the EHR.   "

## 2024-12-13 NOTE — Assessment & Plan Note (Signed)
 BP remains stable on Cardizem  drip -Restarting carvedilol 

## 2024-12-13 NOTE — Assessment & Plan Note (Signed)
 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

## 2024-12-13 NOTE — Inpatient Diabetes Management (Addendum)
 Inpatient Diabetes Program Recommendations  AACE/ADA: New Consensus Statement on Inpatient Glycemic Control (2015)  Target Ranges:  Prepandial:   less than 140 mg/dL      Peak postprandial:   less than 180 mg/dL (1-2 hours)      Critically ill patients:  140 - 180 mg/dL    Latest Reference Range & Units 12/13/24 05:46  Sodium 135 - 145 mmol/L 135  Potassium 3.5 - 5.1 mmol/L 5.0  Chloride 98 - 111 mmol/L 100  CO2 22 - 32 mmol/L 18 (L)  Glucose 70 - 99 mg/dL 512 (H)  BUN 8 - 23 mg/dL 39 (H)  Creatinine 9.38 - 1.24 mg/dL 9.20  Calcium  8.9 - 10.3 mg/dL 8.7 (L)  Anion gap 5 - 15  18 (H)    Latest Reference Range & Units 12/13/24 06:47 12/13/24 08:01 12/13/24 09:01 12/13/24 10:03  Glucose-Capillary 70 - 99 mg/dL 520 (H)  IV Insulin  Drip Started 446 (H) 313 (H) 215 (H)  (H): Data is abnormally high    To ED with Nosebleed and DKA and AFib w/ RVR 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  Most of the conversation focused with the wife as wife gives full time care to pt at home.  Discussed with wife that the steroids pt has been getting at home have caused diabetes/hyperglycemia.  Discussed with patient's wife diagnosis of DKA (pathophysiology), treatment of DKA, lab results, and transition plan to SQ insulin  regimen.  Wife understands pt will likely need insulin  at home--she is OK with this and will need to learn how to give insulin  as she is pt primary caretaker.  Wife asked me to wait until tomorrow to show her the insulin  pen as she is overwhelmed and tired.  We  talked about goals of care for home in regards to CBGs.  Discussed with wife that we do not have to have perfect CBG control at home but that it would be best to keep CBGs below 200.  Will reach out to OP pharmacy to check on insulin  costs and see if CGM is covered.       --Will follow patient during hospitalization--  Adina Rudolpho Arrow RN, MSN, CDCES Diabetes Coordinator Inpatient Glycemic Control Team Team Pager: [phone] (8a-5p)

## 2024-12-13 NOTE — ED Provider Notes (Incomplete)
 " [place] [facility] Provider Note   CSN: [id number] Arrival date & time: 12/13/24  9480     Patient presents with: No chief complaint on file.   Wayne Molina is a 69 y.o. male.  {Add pertinent medical, surgical, social history, OB history to YEP:67052} Patient with history of chronic afib on Eliquis , glioblastoma, hypertension, hyperlipidemia presents today with complaints of epistaxis. Reports that he has been having a nosebleed off and on for the past few weeks. This time bleeding reportedly began at 3:30 this morning. EMS also noted patients blood sugar was in the 500s, has no history of diabetes but is on chronic steroids for glioblastoma.   The history is provided by the patient. No language interpreter was used.       Prior to Admission medications  Medication Sig Start Date End Date Taking? Authorizing Provider  acetaminophen  (TYLENOL ) 325 MG tablet Take 1-2 tablets (325-650 mg total) by mouth every 4 (four) hours as needed for mild pain (pain score 1-3). 08/08/24   Angiulli, Daniel J, PA-C  amoxicillin -clavulanate (AUGMENTIN ) 875-125 MG tablet Take 1 tablet by mouth every 12 (twelve) hours. 11/23/24   Zelaya, Oscar A, PA-C  apixaban  (ELIQUIS ) 5 MG TABS tablet Take 1 tablet (5 mg total) by mouth 2 (two) times daily. 11/16/24   Angiulli, Toribio PARAS, PA-C  atorvastatin  (LIPITOR) 40 MG tablet Take 1 tablet (40 mg total) by mouth daily. 11/16/24   Angiulli, Toribio PARAS, PA-C  calcium  carbonate (OSCAL) 1500 (600 Ca) MG TABS tablet Take 1 tablet (1,500 mg total) by mouth 2 (two) times daily with a meal. 09/08/24   Janna Ferrier, DO  carvedilol  (COREG ) 12.5 MG tablet Take 1 tablet (12.5 mg total) by mouth 2 (two) times daily. 11/16/24   Angiulli, Daniel J, PA-C  Cyanocobalamin (VITAMIN B-12 PO) Take 1 capsule by mouth daily.    [provider]  dexamethasone  (DECADRON ) 4 MG tablet Take 1 tablet (4 mg total) by mouth 2 (two) times daily. 12/12/24   Vaslow,  Zachary K, MD  gabapentin  (NEURONTIN ) 100 MG capsule Take 1 capsule (100 mg total) by mouth 3 (three) times daily. 11/16/24   Angiulli, Toribio PARAS, PA-C  levETIRAcetam  (KEPPRA ) 1000 MG tablet Take 1 tablet (1,000 mg total) by mouth 2 (two) times daily. 11/16/24   Angiulli, Toribio PARAS, PA-C  magnesium  oxide (MAG-OX) 400 (240 Mg) MG tablet Take 0.5 tablets (200 mg total) by mouth daily in the afternoon. 11/16/24   Angiulli, Daniel J, PA-C  melatonin 3 MG TABS tablet Take 1 tablet (3 mg total) by mouth at bedtime. 11/16/24   Angiulli, Daniel J, PA-C  Multiple Vitamin (MULTIVITAMIN ADULT PO) Take 1 tablet by mouth daily. 02/07/18   [provider]  nystatin  (MYCOSTATIN /NYSTOP ) powder Apply topically 2 (two) times daily. 11/15/24   Angiulli, Toribio PARAS, PA-C  pantoprazole  (PROTONIX ) 40 MG tablet Take 1 tablet (40 mg total) by mouth daily. 11/16/24   Angiulli, Toribio PARAS, PA-C  polyethylene glycol (MIRALAX  / GLYCOLAX ) 17 g packet Take 17 g by mouth daily.    [provider]  QUEtiapine  (SEROQUEL ) 25 MG tablet Take 1 tablet (25 mg total) by mouth daily. 11/16/24   Angiulli, Daniel J, PA-C  sodium chloride  (OCEAN) 0.65 % SOLN nasal spray Place 2 sprays into both nostrils every 2 (two) hours. 11/27/24   Tobie Eldora NOVAK, MD  tamsulosin  (FLOMAX ) 0.4 MG CAPS capsule Take 1 capsule (0.4 mg total) by mouth daily. 11/16/24  Angiulli, Toribio PARAS, PA-C  temozolomide  (TEMODAR ) 140 MG capsule Take 2 capsules (280 mg total) by mouth daily. Take for 5 days on, 23 days off. Repeat every 28 days. May take on an empty stomach to decrease nausea & vomiting. 10/09/24   Vaslow, Zachary K, MD    Allergies: Patient has no known allergies.    Review of Systems  HENT:  Positive for nosebleeds.   All other systems reviewed and are negative.   Updated Vital Signs BP (!) 149/102 (BP Location: Right Arm)   Pulse (!) 126   Resp 20   Ht 5' 8 (1.727 m)   Wt 71.7 kg   SpO2 95%   BMI 24.02 kg/m   Physical Exam Vitals and nursing  note reviewed.  Constitutional:      General: He is not in acute distress.    Appearance: He is normal weight. He is not toxic-appearing or diaphoretic.     Comments: Chronically ill appearing  HENT:     Head: Normocephalic and atraumatic.     Comments: Dried blood present in bilateral nares. No signs of active bleeding    Mouth/Throat:     Comments: Large blood clot presents in the oropharynx Cardiovascular:     Rate and Rhythm: Tachycardia present. Rhythm irregular.  Pulmonary:     Effort: Pulmonary effort is normal. No respiratory distress.  Musculoskeletal:        General: Normal range of motion.     Cervical back: Normal range of motion.  Skin:    General: Skin is warm and dry.  Neurological:     General: No focal deficit present.     Mental Status: He is alert.  Psychiatric:        Mood and Affect: Mood normal.        Behavior: Behavior normal.     (all labs ordered are listed, but only abnormal results are displayed) Labs Reviewed  CBC WITH DIFFERENTIAL/PLATELET - Abnormal; Notable for the following components:      Result Value   WBC 12.4 (*)    Neutro Abs 11.4 (*)    Lymphs Abs 0.3 (*)    Abs Immature Granulocytes 0.16 (*)    All other components within normal limits  I-STAT VENOUS BLOOD GAS, ED - Abnormal; Notable for the following components:   pH, Ven 7.446 (*)    pCO2, Ven 24.8 (*)    Bicarbonate 17.1 (*)    TCO2 18 (*)    Acid-base deficit 5.0 (*)    Calcium , Ion 0.98 (*)    HCT 37.0 (*)    Hemoglobin 12.6 (*)    All other components within normal limits  BASIC METABOLIC PANEL WITH GFR  URINALYSIS, ROUTINE W REFLEX MICROSCOPIC  BETA-HYDROXYBUTYRIC ACID  PROTIME-INR  CBG MONITORING, ED  TYPE AND SCREEN  ABO/RH    EKG: None  Radiology: No results found.  {Document cardiac monitor, telemetry assessment procedure when appropriate:32947} Procedures   Medications Ordered in the ED - No data to display  Clinical Course as of 12/13/24 2248   Wed Dec 13, 2024  0635 Hemoglobin: 14.6 [JS]  0648 Glucose-Capillary(!): 479 [JS]  0751 Anion gap(!): 18 [JS]  0751 Glucose(!): 487 [JS]  0751 Osmolality(!!): 326 [JS]  0814 Beta-Hydroxybutyric Acid(!): 4.38 [JS]    Clinical Course User Index [JS] Soto, Johana, PA-C   {Click here for ABCD2, HEART and other calculators REFRESH Note before signing:1}  Medical Decision Making Amount and/or Complexity of Data Reviewed Labs: ordered. Decision-making details documented in ED Course.  Risk OTC drugs. Decision regarding hospitalization.   This patient is a 69 y.o. male who presents to the ED for concern of epistaxis, hyperglycemia, tachycardia, this involves an extensive number of treatment options, and is a complaint that carries with it a high risk of complications and morbidity. The emergent differential diagnosis prior to evaluation includes, but is not limited to,  DKA/HHS, anemia, sepsis . This is not an exhaustive differential.   Past Medical History / Co-morbidities / Social History:  has a past medical history of Anxiety, Arthritis, Chronic atrial fibrillation (HCC), Depression, Family history of breast cancer, Family history of cancer of male genital organ, Family history of gene mutation, Family history of malignant neoplasm of gastrointestinal tract, History of radiation therapy, Hyperlipidemia, Hypertension, Personal history of colonic polyps (11/12/2006), and Sleep apnea.  Additional history: Chart reviewed. Pertinent results include: ***  Physical Exam: Physical exam performed. The pertinent findings include: large blood clot present in the oropharynx with dried blood in bilateral nares. Able to use suction and get patient to cough up the large blood clot, sprayed out bilateral nares with Afrin, holding pressure currently without any residual bleeding present from the nares or within the oropharynx  Lab Tests: I ordered, and personally  interpreted labs.  The pertinent results include:  ***   Imaging Studies: I ordered imaging studies including ***. I independently visualized and interpreted imaging which showed ***. I agree with the radiologist interpretation.   Cardiac Monitoring:  The patient was maintained on a cardiac monitor.  My attending physician Dr. PIERRETTE viewed and interpreted the cardiac monitored which showed an underlying rhythm of: ***. I agree with this interpretation.   Medications: I ordered medication including ***  for ***. Reevaluation of the patient after these medicines showed that the patient {resolved/improved/worsened:23923::improved}. I have reviewed the patients home medicines and have made adjustments as needed.  Consultations Obtained: I requested consultation with the ***,  and discussed lab and imaging findings as well as pertinent plan - they recommend: ***   Disposition: After consideration of the diagnostic results and the patients response to treatment, I feel that *** .   ***emergency department workup does not suggest an emergent condition requiring admission or immediate intervention beyond what has been performed at this time. The plan is: ***. The patient is safe for discharge and has been instructed to return immediately for worsening symptoms, change in symptoms or any other concerns.  I discussed this case with my attending physician Dr. PIERRETTE who cosigned this note including patient's presenting symptoms, physical exam, and planned diagnostics and interventions. Attending physician stated agreement with plan or made changes to plan which were implemented.     {Document critical care time when appropriate  Document review of labs and clinical decision tools ie CHADS2VASC2, etc  Document your independent review of radiology images and any outside records  Document your discussion with family members, caretakers and with consultants  Document social determinants of health affecting  pt's care  Document your decision making why or why not admission, treatments were needed:32947:::1}   Final diagnoses:  None    ED Discharge Orders     None        "

## 2024-12-14 ENCOUNTER — Inpatient Hospital Stay (HOSPITAL_COMMUNITY)

## 2024-12-14 ENCOUNTER — Other Ambulatory Visit (HOSPITAL_COMMUNITY): Payer: Self-pay

## 2024-12-14 DIAGNOSIS — G9341 Metabolic encephalopathy: Secondary | ICD-10-CM | POA: Diagnosis present

## 2024-12-14 LAB — URINALYSIS, ROUTINE W REFLEX MICROSCOPIC
Bilirubin Urine: NEGATIVE
Glucose, UA: >=500 mg/dL — AB
Ketones, ur: 5 mg/dL — AB
Leukocytes,Ua: NEGATIVE
Nitrite: POSITIVE — AB
Protein, ur: 100 mg/dL — AB
Specific Gravity, Urine: 1.028 (ref 1.005–1.030)
pH: 5 (ref 5.0–8.0)

## 2024-12-14 LAB — BASIC METABOLIC PANEL WITH GFR
Anion gap: 10 (ref 5–15)
BUN: 43 mg/dL — ABNORMAL HIGH (ref 8–23)
CO2: 21 mmol/L — ABNORMAL LOW (ref 22–32)
Calcium: 8.6 mg/dL — ABNORMAL LOW (ref 8.9–10.3)
Chloride: 104 mmol/L (ref 98–111)
Creatinine, Ser: 0.65 mg/dL (ref 0.61–1.24)
GFR, Estimated: >60 mL/min
Glucose, Bld: 193 mg/dL — ABNORMAL HIGH (ref 70–99)
Potassium: 4.2 mmol/L (ref 3.5–5.1)
Sodium: 136 mmol/L (ref 135–145)

## 2024-12-14 LAB — CBC
HCT: 44.9 % (ref 39.0–52.0)
Hemoglobin: 15.4 g/dL (ref 13.0–17.0)
MCH: 32.4 pg (ref 26.0–34.0)
MCHC: 34.3 g/dL (ref 30.0–36.0)
MCV: 94.5 fL (ref 80.0–100.0)
Platelets: 219 10*3/uL (ref 150–400)
RBC: 4.75 MIL/uL (ref 4.22–5.81)
RDW: 13.6 % (ref 11.5–15.5)
WBC: 4.1 10*3/uL (ref 4.0–10.5)
nRBC: 2.4 % — ABNORMAL HIGH (ref 0.0–0.2)

## 2024-12-14 LAB — HEMOGLOBIN A1C
Hgb A1c MFr Bld: 11 % — ABNORMAL HIGH (ref 4.8–5.6)
Mean Plasma Glucose: 269 mg/dL

## 2024-12-14 LAB — CBG MONITORING, ED: Glucose-Capillary: 154 mg/dL — ABNORMAL HIGH (ref 70–99)

## 2024-12-14 LAB — TSH: TSH: 0.568 u[IU]/mL (ref 0.350–4.500)

## 2024-12-14 LAB — BETA-HYDROXYBUTYRIC ACID: Beta-Hydroxybutyric Acid: 0.21 mmol/L (ref 0.05–0.27)

## 2024-12-14 MED ORDER — FUROSEMIDE 10 MG/ML IJ SOLN: 40.0000 mg | INTRAMUSCULAR | Status: DC

## 2024-12-14 MED ORDER — POTASSIUM PHOSPHATES 15 MMOLE/5ML IV SOLN
20.0000 mmol | Freq: Once | INTRAVENOUS | Status: DC
  Filled 2024-12-14: qty 6.67

## 2024-12-14 MED ORDER — INSULIN GLARGINE-YFGN 100 UNIT/ML ~~LOC~~ SOLN: 10.0000 [IU] | Freq: Every day | SUBCUTANEOUS | Status: DC

## 2024-12-14 MED ORDER — DEXAMETHASONE SODIUM PHOSPHATE 4 MG/ML IJ SOLN: 2.0000 mg | Freq: Every day | INTRAMUSCULAR | Status: DC

## 2024-12-14 MED ORDER — DEXAMETHASONE SODIUM PHOSPHATE 4 MG/ML IJ SOLN
4.0000 mg | Freq: Two times a day (BID) | INTRAMUSCULAR | Status: DC
  Administered 2024-12-14: 4 mg via INTRAVENOUS
  Filled 2024-12-14: qty 1

## 2024-12-14 MED ORDER — INSULIN ASPART 100 UNIT/ML IJ SOLN: 0.0000 [IU] | Freq: Four times a day (QID) | INTRAMUSCULAR | Status: DC

## 2024-12-14 MED ORDER — SODIUM PHOSPHATES 45 MMOLE/15ML IV SOLN
45.0000 mmol | Freq: Once | INTRAVENOUS | Status: DC
  Filled 2024-12-14: qty 15

## 2024-12-14 NOTE — Progress Notes (Signed)
 "    Daily Progress Note Intern Pager: [phone]  Patient name: Wayne Molina Medical record number: [id number] Date of birth: [birth year] Age: 69 y.o. Gender: male  Primary Care Provider: Janna Ferrier, DO Consultants: Palliative, Cardiology Code Status: DNR (new as of 12/13/2024)  Pt Overview and Major Events to Date:  2/4 - admitted to [facility]  Medical Decision Making:  Pt is a 69 yo M w/ hx of Glioblastoma, HTN, HLD, Anxiety/Depression, A-fib (on Eliquis ), Seizures, GERD who presented to the ED w/ complaint of persistent nosebleed and was found in DKA.  Assessment & Plan DKA (diabetic ketoacidosis) (HCC) AMS (altered mental status) Acute metabolic encephalopathy Likely induced by high-dose decadron  given for Glioblastoma. B-Hydroxybuterate 4.38 (now 0.21), Anion gap 18 (now 10), CBG >500 in ED (now 193). Off of endotool, now on subcutaneous insulin . GCS 7. - DKA Protocol - Tapering off Decadron  (see below) - Resistant SSI TID w/ meals + 10U Glargine daily at bedtime - Start Lantus  45U daily  - STAT CT - UA w reflex - Blood cultures x2 w/ reflex - Add fluids if not transferred to ICU - Consulted CCM, appreciate recs - Urine Culture - Blood Culture x2 Atrial fibrillation with RVR (HCC) Epistaxis History of A.Fib, on Eliquis . Has been having nose-bleeds on and off this last week, and worsening bleeding last 2 days. Was in RVR in the ED, after administration of Afrin. - Eliquis  5mg  BID - Diltiazem  65mL/hr per cardiology - Monitor Hgb - Continue Afrin spray as needed - monitoring - Daily CBC until Hgb stable GBM (glioblastoma multiforme) (HCC) Glioblastoma, IDH-wildtype (HCC) History of GBM s/p craniotomy. Has been seeing oncology and been on High dose decadron . - Tapering down Decadron  (below rec per Dr. [name])  - 4mg  for next 3 days  - 2mg  daily starting 12/17/2024 - Continue Temozolomide  - Follow up with oncology outpatient - Palliative care consulted (for GOC, not  comfort cares) Headache Pt frequently grabs his head with both his hands, suggesting pain in his GCS 7 state. - STAT CT head to r/o bleed. Chronic health problem Seizures - Continue home keppra  (from GBM) Generalized Weakness - PT/OT consult, Fall precautions HLD - Continue home Lipitor 40mg  daily HTN - Continue home Carvedilol  25mg  BID per cardiology BPH - Monitoring for urine retention, continue tamsulosin   FEN/GI: NPO PPx: Eliquis  (A Fib) Dispo:Pending PT recommendations  pending clinical improvement . Barriers include clinical improvement.   Subjective:  Pt was GCS 7 on interview and only localizing to pain. Wife at bedside, her questions and concerns were answered.  Objective: Temp:  [97.1 F (36.2 C)-99.8 F (37.7 C)] 99.8 F (37.7 C) 12/21/2024 0656) Pulse Rate:  [42-99] 90 12/21/24 0520) Resp:  [13-32] 24 2024-12-21 0520) BP: (92-137)/(47-94) 104/57 12/21/24 0520) SpO2:  [90 %-100 %] 90 % 12-21-24 0520) Physical Exam Vitals and nursing note reviewed. Exam conducted with a chaperone present.  Constitutional:      General: He is in acute distress.     Appearance: He is normal weight.  Cardiovascular:     Rate and Rhythm: Normal rate. Rhythm irregular.     Heart sounds: No murmur heard. Pulmonary:     Effort: Pulmonary effort is normal. No respiratory distress.     Breath sounds: Normal breath sounds. No wheezing.  Abdominal:     General: There is no distension.     Palpations: Abdomen is soft.  Skin:    General: Skin is warm and dry.  Neurological:  Mental Status: He is alert. He is disoriented.     GCS: GCS eye subscore is 1. GCS verbal subscore is 1. GCS motor subscore is 5.      Laboratory: Most recent CBC Lab Results  Component Value Date   WBC 12.8 (H) 12/13/2024   HGB 11.9 (L) 12/13/2024   HCT 35.0 (L) 12/13/2024   MCV 91.9 12/13/2024   PLT 216 12/13/2024   Most recent BMP    Latest Ref Rng & Units Dec 23, 2024    2:22 AM  BMP  Glucose 70 - 99 mg/dL 806    BUN 8 - 23 mg/dL 43   Creatinine 9.38 - 1.24 mg/dL 9.34   Sodium 864 - 854 mmol/L 136   Potassium 3.5 - 5.1 mmol/L 4.2   Chloride 98 - 111 mmol/L 104   CO2 22 - 32 mmol/L 21   Calcium  8.9 - 10.3 mg/dL 8.6     Imaging/Diagnostic Tests: DG Chest Portable 1 View Result Date: 12/13/2024 CLINICAL DATA:  Shortness of breath, productive cough, congestion EXAM: PORTABLE CHEST 1 VIEW COMPARISON:  11/29/2024 FINDINGS: Similar cardiomegaly without edema, acute CHF, significant pneumonia, large effusion, or pneumothorax. Trachea midline. Aorta atherosclerotic. Cardiac loop recorder over the left chest. No acute osseous finding. Overall stable exam. IMPRESSION: Cardiomegaly without acute process. Electronically Signed   By: CHRISTELLA.  Shick M.D.   On: 12/13/2024 13:45     Lera Nancyann NOVAK, DO 12-23-2024, 7:24 AM  PGY-1, [facility] FPTS Intern pager: [phone], text pages welcome Secure chat group [facility] Teaching Service   "

## 2024-12-14 NOTE — Progress Notes (Signed)
 PT Cancellation Note  Patient Details Name: Wayne Molina MRN: [id number] DOB: 1956-05-11   Cancelled Treatment:    Reason Eval/Treat Not Completed: Patient not medically ready;Medical issues which prohibited therapy.  Pt is present being attended to and is not appropriate for any therapy today per RN.   Vinie GAILS Marton Malizia 2025/01/09, 11:13 AM

## 2024-12-14 NOTE — Assessment & Plan Note (Addendum)
 History of GBM s/p craniotomy. Has been seeing oncology and been on High dose decadron . - Tapering down Decadron  (below rec per Dr. [name])  - 4mg  for next 3 days  - 2mg  daily starting 12/17/2024 - Continue Temozolomide  - Follow up with oncology outpatient - Palliative care consulted (for GOC, not comfort cares)

## 2024-12-14 NOTE — Progress Notes (Signed)
 " Progress Note  Patient Name: Wayne Molina Date of Encounter: December 23, 2024  Primary Cardiologist: Jerel Balding, MD  Subjective   Wife at bedside, reports increased work of breathing this AM, holding head and moaning. He is nonverbal at this point. Will not keep O2 on, grabbing and pulling it off. Crackly breathing noted with tachypnea. Stat CT head planned. Not taking orals now. On dilt gtt @ 5mg /hr.  Inpatient Medications    Scheduled Meds:  atorvastatin   40 mg Oral Daily   calcium  carbonate  1,250 mg Oral BID WC   carvedilol   25 mg Oral BID   dexamethasone  (DECADRON ) injection  4 mg Intravenous Q12H   Followed by   NOREEN ON 12/16/2024] dexamethasone  (DECADRON ) injection  2 mg Intravenous Daily   furosemide   40 mg Intravenous Once   gabapentin   100 mg Oral TID   guaiFENesin   10 mL Oral Q8H   insulin  aspart  0-20 Units Subcutaneous TID WC   insulin  glargine-yfgn  20 Units Subcutaneous QHS   levETIRAcetam   1,000 mg Intravenous Q12H   magnesium  oxide  200 mg Oral QHS   nystatin    Topical BID   oxymetazoline   2 spray Each Nare BID   pantoprazole   40 mg Oral Daily   polyethylene glycol  17 g Oral Daily   QUEtiapine   25 mg Oral Daily   senna-docusate  1 tablet Oral QHS   tamsulosin   0.4 mg Oral Daily   Continuous Infusions:  diltiazem  (CARDIZEM ) infusion 5 mg/hr (2024/12/23 0729)   PRN Meds: acetaminophen  **OR** acetaminophen , dextrose , hydrALAZINE , HYDROmorphone  (DILAUDID ) injection, ipratropium, ondansetron  **OR** ondansetron  (ZOFRAN ) IV, oxyCODONE    Vital Signs    Vitals:   2024/12/23 0656 12/23/24 0700 12/23/24 0810 12-23-24 0831  BP:  103/78 (!) 110/94 101/81  Pulse:    90  Resp:  (!) 30 (!) 32 20  Temp: 99.8 F (37.7 C)     TempSrc: Rectal     SpO2:    92%  Weight:      Height:       No intake or output data in the 24 hours ending Dec 23, 2024 0959    12/13/2024    5:35 AM 11/29/2024   11:39 AM 11/29/2024   11:38 AM  Last 3 Weights  Weight (lbs) 158 lb 160 lb 15 oz  160 lb 15 oz  Weight (kg) 71.668 kg 73 kg 73 kg     Telemetry    AF RVR with variable rates 100s-140s, presently around 110 range - Personally Reviewed  Physical Exam   GEN: Ill appearing, groaning, tachypneic with increased WOB and crackly BS. Pale. HEENT: Normocephalic, atraumatic, sclera non-icteric. Neck: No JVD or bruits. Cardiac: Irregularly irregular, tachycardic, no murmurs, rubs, or gallops.  Respiratory: Increased WOB with labored breathing  GI: Soft, nontender, non-distended, BS +x 4. MS: decreased muscle mass bilaterally Extremities: No clubbing or cyanosis. No edema. Distal pedal pulses are 2+ and equal bilaterally. Cool to the touch. Neuro:  Lethargic but agitated at times, does not follow commands Psych:  Unable to assess  Labs    High Sensitivity Troponin:  No results for input(s): TROPONINIHS in the last 720 hours.    Cardiac EnzymesNo results for input(s): TROPONINI in the last 168 hours. No results for input(s): TROPIPOC in the last 168 hours.   Chemistry Recent Labs  Lab 12/13/24 1140 12/13/24 2106 12/13/24 2114 2024-12-23 0222  NA 138 136 138 136  K 4.4 4.9 4.2 4.2  CL 105 104  --  104  CO2 19* 22  --  21*  GLUCOSE 233* 256*  --  193*  BUN 42* 43*  --  43*  CREATININE 0.63 0.70  --  0.65  CALCIUM  8.4* 8.4*  --  8.6*  GFRNONAA >60 >60  --  >60  ANIONGAP 14 10  --  10     Hematology Recent Labs  Lab 12/13/24 0546 12/13/24 0636 12/13/24 1140 12/13/24 2114 12/25/2024 0800  WBC 12.4*  --  12.8*  --  4.1  RBC 4.43  --  3.95*  --  4.75  HGB 14.6   < > 12.9* 11.9* 15.4  HCT 41.5   < > 36.3* 35.0* 44.9  MCV 93.7  --  91.9  --  94.5  MCH 33.0  --  32.7  --  32.4  MCHC 35.2  --  35.5  --  34.3  RDW 13.5  --  13.4  --  13.6  PLT 233  --  216  --  219   < > = values in this interval not displayed.    BNP Recent Labs  Lab 12/13/24 1140  PROBNP 1,216.0*     DDimer No results for input(s): DDIMER in the last 168 hours.   Radiology     DG Chest Portable 1 View Result Date: 12/13/2024 CLINICAL DATA:  Shortness of breath, productive cough, congestion EXAM: PORTABLE CHEST 1 VIEW COMPARISON:  11/29/2024 FINDINGS: Similar cardiomegaly without edema, acute CHF, significant pneumonia, large effusion, or pneumothorax. Trachea midline. Aorta atherosclerotic. Cardiac loop recorder over the left chest. No acute osseous finding. Overall stable exam. IMPRESSION: Cardiomegaly without acute process. Electronically Signed   By: CHRISTELLA.  Shick M.D.   On: 12/13/2024 13:45    Cardiac Studies   2d echo 06/2024    1. Left ventricular ejection fraction, by estimation, is 60 to 65%. Left  ventricular ejection fraction by PLAX is 61 %. The left ventricle has  normal function. The left ventricle has no regional wall motion  abnormalities. There is moderate left  ventricular hypertrophy. Left ventricular diastolic function could not be  evaluated.   2. Right ventricular systolic function is moderately reduced. The right  ventricular size is mildly enlarged.   3. Left atrial size was moderately dilated.   4. Right atrial size was severely dilated.   5. The mitral valve is grossly normal. Trivial mitral valve  regurgitation.   6. The aortic valve is calcified. Aortic valve regurgitation is mild.  Aortic valve sclerosis/calcification is present, without any evidence of  aortic stenosis.   7. The inferior vena cava is dilated in size with <50% respiratory  variability, suggesting right atrial pressure of 15 mmHg.   8. Cannot exclude a small PFO.   9. Rhythm strip during this exam demonstrates atrial fibrillation.   Patient Profile     69 y.o. male with apical variant hypertrophic cardiomyopathy, atrial flutter s/p ablation in 2006, permanent atrial fibrillation not requiring AVN blocking agents, syncope of unclear etiology, hypertension, hyperlipidemia, osteopenia, depression/anxiety, GBM s/p craniotomy 07/2024 c/b seziures on keppra , sleep apnea not  CPAP tolerant, small chronic cerebellar infarcts by brain MRI, DVT 07/2024. Did not previously undergo cMRI due to severe claustrophobia. Last echo 06/2024 EF 60-65%, moderate LVH, mildly enlarged RV/moderately reduced RV function, cannot exclude small PFO. Has had recent concern for overall health decline with severe functional decline and neurocognitive impairment having more confusion/delusions, falling. Has been receiving steroids as part of glio treatment. Admitted with nosebleed and DKA, cardiology  found to have AF RVR.  Assessment & Plan    1. 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 Permanent atrial fibrillation presenting with RVR, remote hx of atrial flutter - likely precipitated by physiologic stressor above - add TSH with labs - Eliquis  on hold given concerns for brain bleed - stat CT head pending - hold oral carvedilol  (did not receive last night either) - continue diltiazem  drip, can titrate if needed for heart rate. If needed can add IV metoprolol  but will try to keep regimen simple - no role for DCCV given permanent nature of AF  3. Tachypnea, crackles on exam - stat portable CXR - d/w Dr. Raford, give empiric dose of IV Lasix  40mg  now - notified IM team of worsening respiratory exam, they will come down to see and plan to involve PCCM  4. RV dysfunction/enlargement, hx of apical HCM - echo 06/2024 EF 60-65%, moderate LVH, mildly enlarged RV/moderately reduced RV function, cannot exclude small PFO, unclear precipitation of changes but may be related to OSA intolerant of treatment. Otherwise no LE edema on exam - continue to follow in context above  For questions or updates, please contact [facility] Please consult [email or web address] for contact info under Cardiology/STEMI.  Signed, Raphael LOISE Bring, PA-C 12/31/2024, 9:59 AM    "

## 2024-12-14 NOTE — Progress Notes (Signed)
 OT Cancellation Note  Patient Details Name: Wayne Molina MRN: [id number] DOB: 08-Sep-1956   Cancelled Treatment:    Reason Eval/Treat Not Completed: Medical issues which prohibited therapy;Patient not medically ready (Per RN, pt is not appropriate for therapies at this time. OT to follow up when appropriate.)  Caedmon Louque D Causey 2024-12-20, 1:00 PM

## 2024-12-14 NOTE — Assessment & Plan Note (Addendum)
 Pt frequently grabs his head with both his hands, suggesting pain in his GCS 7 state. - STAT CT head to r/o bleed.

## 2024-12-14 NOTE — Plan of Care (Signed)
" ° °  Medical records reviewed. Patient with rapid deterioration overnight and died at 11:15 prior to my availability for a follow-up visit today.  Thank you for your referral and allowing PMT to assist in Mr. Wayne Molina's care.   Jaliza Seifried, PA-C Palliative Medicine Team  Team Phone # [phone]   NO CHARGE   "

## 2024-12-14 NOTE — Progress Notes (Signed)
 This chaplain responded with presence to ED page at 1540.   The chaplain received an update from the unit upon arrival. The chaplain understands from the unit the family has left the room. The Pt. room was empty at the time of the visit.The chaplain confirmed with the unit and understands the Pt. was transported to the morgue.  Chaplain Leeroy Hummer [phone]

## 2024-12-14 NOTE — Assessment & Plan Note (Signed)
 Likely induced by high-dose decadron  given for Glioblastoma. B-Hydroxybuterate 4.38 (now 0.21), Anion gap 18 (now 10), CBG >500 in ED (now 193). Off of endotool, now on subcutaneous insulin . GCS 7. - DKA Protocol - Tapering off Decadron  (see below) - Resistant SSI TID w/ meals + 10U Glargine daily at bedtime - Start Lantus  45U daily  - STAT CT - UA w reflex - Blood cultures x2 w/ reflex - Add fluids if not transferred to ICU - Consulted CCM, appreciate recs - Urine Culture - Blood Culture x2

## 2024-12-14 NOTE — Care Plan (Signed)
 Patient belongs to family practice teaching program and followed by them.  Family practice team to take over patient.  TR[facility] will sign off.  Discussed with teaching team,Dr Lonnie.

## 2024-12-14 NOTE — Assessment & Plan Note (Addendum)
 Seizures - Continue home keppra  (from GBM) Generalized Weakness - PT/OT consult, Fall precautions HLD - Continue home Lipitor 40mg  daily HTN - Continue home Carvedilol  25mg  BID per cardiology BPH - Monitoring for urine retention, continue tamsulosin 

## 2024-12-14 NOTE — Assessment & Plan Note (Addendum)
 History of A.Fib, on Eliquis . Has been having nose-bleeds on and off this last week, and worsening bleeding last 2 days. Was in RVR in the ED, after administration of Afrin. - Eliquis  5mg  BID - Diltiazem  71mL/hr per cardiology - Monitor Hgb - Continue Afrin spray as needed - monitoring - Daily CBC until Hgb stable

## 2024-12-14 NOTE — Progress Notes (Signed)
 " PROGRESS NOTE    Wayne Molina  FMW:[id number] DOB: [birth year] DOA: 12/13/2024 PCP: Janna Ferrier, DO    Brief Narrative:  69 year old with history of glioblastoma on high-dose steroids, hypertension, hyperlipidemia, anxiety depression, chronic A-fib on Eliquis , seizure disorder and GERD presented with intermittent nosebleed for the past week, persistent since early morning.  Also complaining of increased urinary frequency.  In the emergency room blood pressure is stable.  Heart rate 92.  Temperature normal.  Patient was found to have blood sugar of 487 as well anion gap of 18.  Started on insulin  drip.  Epistaxis was treated with Afrin.  Nosebleed has stopped.  Patient developed RVR, started on Cardizem  drip.  Subjective: Patient seen and examined.  He was unable to interact or communicate.  He was pointing to his head and complained of headache.  Wife at the bedside.  She reported that he feels better after pain medications however hold his head in pain and looks uncomfortable. Remains A-fib with RVR, heart rate anywhere between 110-150.  On 5 mg Cardizem  drip. Failed bedside swallow evaluation. Looks in very poor shape.  Assessment & Plan:   Diabetic ketoacidosis, steroid-induced new onset diabetes: Patient on high-dose steroids for his glioblastoma.  Currently on IV dexamethasone  because of pain and headache.  Initially treated with insulin  drip, subsequently now changed to subcu insulin .  Will keep on subcu insulin .  A1c is 11. Keep on subcu insulin  today.  Need to be careful to avoid hypoglycemia as he is not eating anything. Patient will need insulin  to go home.  Epistaxis: On and off epistaxis.  Symptomatic management.  Eliquis  is on hold.  Patient is not able to take anything by mouth.  Will hold Eliquis .  A-fib with RVR: Presented with heart rate of 160.  Started on Cardizem  drip.  Therapeutic on Eliquis .  Cardiology consulted. Remains on low-dose Cardizem , Coreg  25 mg twice daily.   Has history of bradycardia.  Hopefully can he can come off Cardizem . Holding Eliquis , pending CT head and oral intake.   Acute metabolic encephalopathy with underlying glioblastoma, encephalopathy and history seizure: Followed outpatient by oncology.  On oral steroids-currently on IV steroids. On Keppra  for seizure prophylaxis, currently receiving IV as he is not able to take anything by mouth. Patient looks very sick, very debilitated now unable to eat. Appreciate palliative care follow-up. Provide all supportive care.  Goal of care: Remains DNR/DNI, however desires to continue current treatment. I discussed with patient's wife at the bedside, she stated he has been debilitated for a long time and has been gradually failing. He has not gotten out of bed in weeks. Though he is MRI of the brain shows a stable tumor, his overall health condition is very poor. I asked patient's wife whether she has thought about palliation and hospice pathway, she is aware about it but she has not thought about it yet. She mentioned that she does not want to see him suffering. Will continue current care.  Palliative care following.  If no appropriate improvement or further worsening, patient is appropriate for hospice care.     DVT prophylaxis: SCDs Start: 12/13/24 9187 Place TED hose Start: 12/13/24 9187   Code Status: DNR/DNI Family Communication: Wife at the bedside Disposition Plan: Status is: Inpatient Remains inpatient appropriate because: Cardizem  drip, medication titration.  Admit to progressive bed as patient is needing more intensive care.     Consultants:  Cardiology Palliative care  Procedures:  None  Antimicrobials:  None  Objective: Vitals:   2024/12/15 0656 15-Dec-2024 0700 December 15, 2024 0810 12-15-24 0831  BP:  103/78 (!) 110/94 101/81  Pulse:    90  Resp:  (!) 30 (!) 32 20  Temp: 99.8 F (37.7 C)     TempSrc: Rectal     SpO2:    92%  Weight:      Height:       No  intake or output data in the 24 hours ending 12-15-24 1014  Filed Weights   12/13/24 0535  Weight: 71.7 kg    Examination:  General: Sick looking.  Frail and debilitated.  Looks uncomfortable. Cardiovascular: S1-S2 normal.  Irregularly irregular.  Tachycardic. Respiratory: Conducted upper airway sounds.  Not in any distress. Gastrointestinal: Soft.  Nontender.  Bowel sound present. Ext: No swelling or edema.  No cyanosis. Neuro: Uncomfortable, does not follow commands.  Looks anxious.  Keeps eyes closed.  Moves all extremities equally.     Data Reviewed: I have personally reviewed following labs and imaging studies  CBC: Recent Labs  Lab 12/13/24 0546 12/13/24 0636 12/13/24 1140 12/13/24 2114 December 15, 2024 0800  WBC 12.4*  --  12.8*  --  4.1  NEUTROABS 11.4*  --  11.4*  --   --   HGB 14.6 12.6* 12.9* 11.9* 15.4  HCT 41.5 37.0* 36.3* 35.0* 44.9  MCV 93.7  --  91.9  --  94.5  PLT 233  --  216  --  219   Basic Metabolic Panel: Recent Labs  Lab 12/13/24 0546 12/13/24 0636 12/13/24 1140 12/13/24 2106 12/13/24 2114 12-15-24 0222  NA 135 135 138 136 138 136  K 5.0 4.6 4.4 4.9 4.2 4.2  CL 100  --  105 104  --  104  CO2 18*  --  19* 22  --  21*  GLUCOSE 487*  --  233* 256*  --  193*  BUN 39*  --  42* 43*  --  43*  CREATININE 0.79  --  0.63 0.70  --  0.65  CALCIUM  8.7*  --  8.4* 8.4*  --  8.6*  MG  --   --  2.1  --   --   --   PHOS  --   --  1.7*  --   --   --    GFR: Estimated Creatinine Clearance: 83.1 mL/min (by C-G formula based on SCr of 0.65 mg/dL). Liver Function Tests: No results for input(s): AST, ALT, ALKPHOS, BILITOT, PROT, ALBUMIN in the last 168 hours. No results for input(s): LIPASE, AMYLASE in the last 168 hours. No results for input(s): AMMONIA in the last 168 hours. Coagulation Profile: Recent Labs  Lab 12/13/24 0650  INR 1.2   Cardiac Enzymes: No results for input(s): CKTOTAL, CKMB, CKMBINDEX, TROPONINI in the last 168  hours. BNP (last 3 results) Recent Labs    12/13/24 1140  PROBNP 1,216.0*   HbA1C: Recent Labs    12/13/24 0900  HGBA1C 11.0*   CBG: Recent Labs  Lab 12/13/24 1208 12/13/24 1323 12/13/24 1633 12/13/24 1846 12-15-2024 0753  GLUCAP 203* 200* 183* 274* 154*   Lipid Profile: No results for input(s): CHOL, HDL, LDLCALC, TRIG, CHOLHDL, LDLDIRECT in the last 72 hours. Thyroid  Function Tests: No results for input(s): TSH, T4TOTAL, FREET4, T3FREE, THYROIDAB in the last 72 hours. Anemia Panel: No results for input(s): VITAMINB12, FOLATE, FERRITIN, TIBC, IRON, RETICCTPCT in the last 72 hours. Sepsis Labs: No results for input(s): PROCALCITON, LATICACIDVEN in the last 168 hours.  No results  found for this or any previous visit (from the past 240 hours).       Radiology Studies: DG Chest Portable 1 View Result Date: 12/13/2024 CLINICAL DATA:  Shortness of breath, productive cough, congestion EXAM: PORTABLE CHEST 1 VIEW COMPARISON:  11/29/2024 FINDINGS: Similar cardiomegaly without edema, acute CHF, significant pneumonia, large effusion, or pneumothorax. Trachea midline. Aorta atherosclerotic. Cardiac loop recorder over the left chest. No acute osseous finding. Overall stable exam. IMPRESSION: Cardiomegaly without acute process. Electronically Signed   By: CHRISTELLA.  Shick M.D.   On: 12/13/2024 13:45        Scheduled Meds:  atorvastatin   40 mg Oral Daily   calcium  carbonate  1,250 mg Oral BID WC   carvedilol   25 mg Oral BID   dexamethasone  (DECADRON ) injection  4 mg Intravenous Q12H   Followed by   NOREEN ON 12/16/2024] dexamethasone  (DECADRON ) injection  2 mg Intravenous Daily   furosemide   40 mg Intravenous Once   gabapentin   100 mg Oral TID   guaiFENesin   10 mL Oral Q8H   insulin  aspart  0-20 Units Subcutaneous TID WC   insulin  glargine-yfgn  20 Units Subcutaneous QHS   levETIRAcetam   1,000 mg Intravenous Q12H   magnesium  oxide  200 mg Oral  QHS   nystatin    Topical BID   oxymetazoline   2 spray Each Nare BID   pantoprazole   40 mg Oral Daily   polyethylene glycol  17 g Oral Daily   QUEtiapine   25 mg Oral Daily   senna-docusate  1 tablet Oral QHS   tamsulosin   0.4 mg Oral Daily   Continuous Infusions:  diltiazem  (CARDIZEM ) infusion 5 mg/hr (Nov 02, 2025 0729)   potassium PHOSPHATE  IVPB (in mmol)       LOS: 1 day      Renato Applebaum, MD [facility]  "

## 2024-12-14 NOTE — Assessment & Plan Note (Addendum)
 Likely induced by high-dose decadron  given for Glioblastoma. B-Hydroxybuterate 4.38 (now 0.21), Anion gap 18 (now 10), CBG >500 in ED (now 193). Off of endotool, now on subcutaneous insulin . GCS 7. - DKA Protocol - Tapering off Decadron  (see below) - Resistant SSI TID w/ meals + 10U Glargine daily at bedtime - Start Lantus  45U daily  - STAT CT - UA w reflex - Blood cultures x2 w/ reflex - Add fluids if not transferred to ICU - Consulted CCM, appreciate recs - Urine Culture - Blood Culture x2

## 2024-12-14 NOTE — ED Notes (Signed)
 Called the chaplain for  room 10

## 2024-12-14 NOTE — Evaluation (Addendum)
 Clinical/Bedside Swallow Evaluation Patient Details  Name: Wayne Molina MRN: [id number] Date of Birth: 1956-10-15  Today's Date: January 01, 2025 Time: SLP Start Time (ACUTE ONLY): 0950 SLP Stop Time (ACUTE ONLY): 1005 SLP Time Calculation (min) (ACUTE ONLY): 15 min  Past Medical History:  Past Medical History:  Diagnosis Date   Anxiety    Arthritis    fingers   Chronic atrial fibrillation (HCC)    Depression    Family history of breast cancer    Family history of cancer of male genital organ    Family history of gene mutation    BRIP1   Family history of malignant neoplasm of gastrointestinal tract    History of radiation therapy    08-23-24 - 09-12-24 Brain (parietal lobe) Dr. Lauraine Golden, MD   Hyperlipidemia    Hypertension    Personal history of colonic polyps 11/12/2006   Sleep apnea    Past Surgical History:  Past Surgical History:  Procedure Laterality Date   2-D echocardiogram  07/22/2010   Ejection fraction greater than 55%. Left atrium moderately dilated. Right atrium moderately dilated. 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 Mild to Moderate MR. Mild to moderate TR.   APPLICATION OF CRANIAL NAVIGATION Right 07/17/2024   Procedure: COMPUTER-ASSISTED NAVIGATION, FOR CRANIAL PROCEDURE;  Surgeon: Rosslyn Dino HERO, MD;  Location: [facility] OR;  Service: Neurosurgery;  Laterality: Right;  CRANIAL NAVIGATION   ATRIAL ABLATION SURGERY  11/09/2002   EP IMPLANTABLE DEVICE N/A 02/11/2016   Procedure: Loop Recorder Insertion;  Surgeon: Jerel Balding, MD;  Location: [facility] INVASIVE CV LAB;  Service: Cardiovascular;  Laterality: N/A;   Exercise Myoview stress test  07/08/2000   Nonischemic low-risk.   HIP FRACTURE SURGERY  1970s   IR GENERIC HISTORICAL  01/21/2017   IR RADIOLOGIST EVAL & MGMT 01/21/2017 [facility]-INTER[facility] RAD   STERIOTACTIC STIMULATOR INSERTION Right 07/17/2024   Procedure: OPEN CRANIOTOMY FOR BIOPSY;  Surgeon: Rosslyn Dino HERO, MD;  Location: [facility] OR;  Service: Neurosurgery;  Laterality:  Right;  RIGHT STEREOTACTIC BRAIN BIOPSY   HPI:  Wayne Molina is a 69 yo male with intermittent epistaxis x1 week and increased urinary frequency. Admitted with DKA secondary to steroid-induced new onset diabetes. CXR shows cardiomegaly without acute process. CTH pending. Admitted 07/06/24 and found to have stage IV multiforme glioblastoma s/p craniotomy. Followed by SLP during admissions August-September 2025 and January 2026 for dysphagia and cognition. MBS 07/28/24 showed moderate oropharyngeal dysphagia and silent aspiration but pt had been tolerating an oral diet without concerns for respiratory compromise or pulmonary infection. He was ultimately advanced to regular solids with thin liquids. Other notable PMH HTN, HLD, anxiety, depression, chronic A-fib on Eliquis , seizure disorder, GERD    Assessment / Plan / Recommendation  Clinical Impression  Pt exhibits risk for aspiration with decreased alertness and h/o cognitive impairment but demonstrates potential to resume POs as strength and alertness improve. Did not directly assess with POs today due to pain and inattention but will continue following as these factors allow. Can offer ice chips or spoon sips of water  with frequent oral care when alert. Otherwise, recommend NPO except meds crushed in puree.    Pt is suspected to be in significant pain with fluctuating level of alertness. He refused all POs but his spouse reports increased difficulty swallowing since being admitted and he did not pass the swallow screen, though was eating pizza independently the night PTA. He now has decreased awareness, affecting labial seal and oral transit but ultimately refuses most boluses by turning  his head away.   SLP Visit Diagnosis: Dysphagia, unspecified (R13.10)    Aspiration Risk       Diet Recommendation           Other Recommendations Oral Care Recommendations: Oral care QID;Oral care prior to ice chip/H20;Staff/trained caregiver to provide oral  care     Swallow Evaluation Recommendations Recommendations: NPO;Ice chips PRN after oral care Medication Administration: Whole meds with puree   Assistance Recommended at Discharge    Functional Status Assessment Patient has had a recent decline in their functional status and demonstrates the ability to make significant improvements in function in a reasonable and predictable amount of time.  Frequency and Duration min 2x/week  2 weeks       Prognosis Prognosis for improved oropharyngeal function: Fair Barriers to Reach Goals: Cognitive deficits;Time post onset      Swallow Study   General HPI: Wayne Molina is a 69 yo male with intermittent epistaxis x1 week and increased urinary frequency. Admitted with DKA secondary to steroid-induced new onset diabetes. CXR shows cardiomegaly without acute process. CTH pending. Admitted 07/06/24 and found to have stage IV multiforme glioblastoma s/p craniotomy. Followed by SLP during admissions August-September 2025 and January 2026 for dysphagia and cognition. MBS 07/28/24 showed moderate oropharyngeal dysphagia and silent aspiration but pt had been tolerating an oral diet without concerns for respiratory compromise or pulmonary infection. He was ultimately advanced to regular solids with thin liquids. Other notable PMH HTN, HLD, anxiety, depression, chronic A-fib on Eliquis , seizure disorder, GERD Type of Study: Bedside Swallow Evaluation Previous Swallow Assessment: see HPI Diet Prior to this Study: NPO Temperature Spikes Noted: No Respiratory Status: Nasal cannula History of Recent Intubation: No Behavior/Cognition: Lethargic/Drowsy;Distractible;Requires cueing Oral Cavity Assessment: Dried secretions Oral Care Completed by SLP: No Oral Cavity - Dentition: Edentulous Vision: Functional for self-feeding Self-Feeding Abilities: Refused PO Patient Positioning: Upright in bed Baseline Vocal Quality: Not observed Volitional Cough: Cognitively  unable to elicit Volitional Swallow: Unable to elicit    Oral/Motor/Sensory Function Overall Oral Motor/Sensory Function: Within functional limits   Ice Chips Ice chips: Impaired Presentation: Spoon Oral Phase Impairments: Poor awareness of bolus   Thin Liquid Thin Liquid: Impaired Presentation: Straw;Spoon Oral Phase Impairments: Poor awareness of bolus    Nectar Thick Nectar Thick Liquid: Not tested   Honey Thick Honey Thick Liquid: Not tested   Puree Puree: Not tested   Solid     Solid: Not tested      Damien Blumenthal, M.A., CCC-SLP Speech Language Pathology, Acute Rehabilitation Services  Secure Chat preferred [phone]  12/16/24,10:58 AM

## 2024-12-14 NOTE — ED Notes (Addendum)
 10:40 noted drop in patient SBP/DBP and MAP. Called CT to postpone imaging due to deterioration. Provider at bedside reports she contacted specialist to come down. Paused cardizem  infusion.   11:00 Repeat blood pressures improved before being unable to get reading, cap refill remains >4 seconds. Patient mental status deteriorating. Annabella Scarce, MD at bedside. Palpable carotid pulse. Patient went from Afib RVR to bradycardic with intermittent Vtach and junctional rhythms. Wife was at bedside, spoke with provider regarding patient wishes. Patient DNR/DNI. NRB placed. Rhythm became idioventricular before asystole. Provider confirms no pulse, heart tones, or respirations. Time of death 1115 12/27/24. Charge aware. Organ services contacted. Chaplain contacted. Family remains at bedside.

## 2024-12-14 NOTE — Hospital Course (Signed)
 Wayne Molina is a 69 y.o.male with a history of glioblastoma, hypertension, anxiety, depression, chronic A-fib on Eliquis , seizure disorder, GERD who was initially admitted to [facility] and later transferred to the family medicine teaching Service at [facility] for DKA and epistaxis. His hospital course is detailed below:  DKA Glioblastoma AMS In the setting of high-dose steroid treatment for glioblastoma.  Had been taking 4 mg Decadron  up to 4 times a day, was instructed by oncologist on 2/3 to decrease to 4 mg twice daily.  CBG greater than 500 with anion gap 18 and elevated BHB.  Stabilized with IV insulin  and transitioned to subcutaneous insulin  on 2/4 once blood sugars below 250 and labs normalized.  Blood sugars were stable in the high 100s on 2/5. Initiated steroid taper per patient's oncologist on 2/5.  Morning of 2/5, patient was noted to have significantly altered mental status and seem to be complaining of headache.  CT head ordered but not completed due to deterioration as below.  Afib RVR Respiratory distress Presented with heart rate 160, started on diltiazem  drip.  Has been adherent with Eliquis .  Cardiology evaluated morning of 2/5, patient found to be tachypneic with crackles on exam, ordered stat CXR and IV Lasix .  PCCM was consulted due to concern for respiratory distress and ongoing altered mental status as above.  Briefly placed on BiPAP at that time.  Unfortunately while cardiology MD was evaluating patient at bedside, patient became bradycardic with intermittent V. tach.  Wife confirmed DNR/DNI status.  Patient then progressed to asystole and time of death was called at 1115.[name]   Epistaxis On and off, hemoglobin remained stable. Treated with Afrin.  Eliquis  was held.   Other chronic conditions were medically managed with home medications and formulary alternatives as necessary (HLD, seizures, HTN, BPH)

## 2024-12-15 ENCOUNTER — Other Ambulatory Visit (HOSPITAL_COMMUNITY): Payer: Self-pay

## 2024-12-27 ENCOUNTER — Encounter

## 2024-12-28 ENCOUNTER — Inpatient Hospital Stay: Admitting: Internal Medicine

## 2025-01-02 ENCOUNTER — Inpatient Hospital Stay

## 2025-01-07 NOTE — Death Summary Note (Signed)
 Family Medicine Teaching [facility] Death Summary  Patient name: Wayne Molina Medical record number: [id number] Date of birth: 1956/09/30 Age: 69 y.o. Gender: male Date of Admission: 01/04/2025  Date of Death: 01-05-2025 Admitting Physician: Renato Applebaum, MD  Primary Care Provider: Janna Ferrier, DO Consultants: Cardiology, palliative  Indication for Hospitalization: DKA  Discharge Diagnoses/Problem List:  Principal Problem:   DKA (diabetic ketoacidosis) (HCC) Active Problems:   Atrial fibrillation with RVR (HCC)   GBM (glioblastoma multiforme) (HCC)   Headache   Glioblastoma, IDH-wildtype (HCC)   Chronic health problem   AMS (altered mental status)   Epistaxis   Acute metabolic encephalopathy    Disposition: Death  Brief Hospital Course:  Wayne Molina is a 69 y.o.male with a history of glioblastoma, hypertension, anxiety, depression, chronic A-fib on Eliquis , seizure disorder, GERD who was initially admitted to [facility] and later transferred to the family medicine teaching Service at [facility] for DKA and epistaxis. His hospital course is detailed below:  DKA Glioblastoma AMS In the setting of high-dose steroid treatment for glioblastoma.  Had been taking 4 mg Decadron  up to 4 times a day, was instructed by oncologist on 2/3 to decrease to 4 mg twice daily.  CBG greater than 500 with anion gap 18 and elevated BHB.  Stabilized with IV insulin  and transitioned to subcutaneous insulin  on 2/4 once blood sugars below 250 and labs normalized.  Blood sugars were stable in the high 100s on 2/5. Initiated steroid taper per patient's oncologist on 2/5.  Morning of 2/5, patient was noted to have significantly altered mental status and seem to be complaining of headache.  CT head ordered but not completed due to deterioration as below.  Afib RVR Respiratory distress Presented with heart rate 160, started on diltiazem  drip.  Has been adherent with Eliquis .  Cardiology evaluated  morning of 2/5, patient found to be tachypneic with crackles on exam, ordered stat CXR and IV Lasix .  PCCM was consulted due to concern for respiratory distress and ongoing altered mental status as above.  Briefly placed on BiPAP at that time.  Unfortunately while cardiology MD was evaluating patient at bedside, patient became bradycardic with intermittent V. tach.  Wife confirmed DNR/DNI status.  Patient then progressed to asystole and time of death was called at 1115.Wayne Molina   Epistaxis On and off, hemoglobin remained stable. Treated with Afrin.  Eliquis  was held.   Other chronic conditions were medically managed with home medications and formulary alternatives as necessary (HLD, seizures, HTN, BPH)   Significant Procedures: none  Significant Labs and Imaging:   Recent Labs  Lab 01/04/2025 0546 01-04-25 0636 January 04, 2025 1140 01-04-2025 2114 01-05-25 0800  WBC 12.4*  --  12.8*  --  4.1  HGB 14.6   < > 12.9* 11.9* 15.4  HCT 41.5   < > 36.3* 35.0* 44.9  PLT 233  --  216  --  219   < > = values in this interval not displayed.   Recent Labs  Lab 01-04-2025 0546 2025/01/04 0636 Jan 04, 2025 1140 2025-01-04 2106 2025-01-04 2114 01-05-25 0222  NA 135 135 138 136 138 136  K 5.0 4.6 4.4 4.9 4.2 4.2  CL 100  --  105 104  --  104  CO2 18*  --  19* 22  --  21*  GLUCOSE 487*  --  233* 256*  --  193*  BUN 39*  --  42* 43*  --  43*  CREATININE 0.79  --  0.63 0.70  --  0.65  CALCIUM  8.7*  --  8.4* 8.4*  --  8.6*  MG  --   --  2.1  --   --   --   PHOS  --   --  1.7*  --   --   --     CXR 2/4: Cardiomegaly without acute process CXR 2/5: New pulmonary edema new perihilar and bibasilar airspace opacities  Adele Song, MD 12/17/2024, 1:55 PM PGY-2, [facility]

## 2025-01-07 DEATH — deceased

## 2025-01-27 ENCOUNTER — Ambulatory Visit

## 2025-02-27 ENCOUNTER — Ambulatory Visit

## 2025-03-30 ENCOUNTER — Ambulatory Visit

## 2025-04-30 ENCOUNTER — Ambulatory Visit

## 2025-05-31 ENCOUNTER — Ambulatory Visit

## 2025-07-01 ENCOUNTER — Ambulatory Visit

## 2025-08-01 ENCOUNTER — Ambulatory Visit

## 2025-09-01 ENCOUNTER — Ambulatory Visit

## 2025-10-02 ENCOUNTER — Ambulatory Visit

## 2025-11-02 ENCOUNTER — Ambulatory Visit

## 2025-12-03 ENCOUNTER — Ambulatory Visit
